# Patient Record
Sex: Female | Born: 1960 | Race: White | Hispanic: No | Marital: Married
Health system: Western US, Academic
[De-identification: ages and names within clinical notes are randomized; demographics above are authoritative.]

## PROBLEM LIST (undated history)

## (undated) MED FILL — DEXAMETHASONE 4 MG TABLET: 4 mg | ORAL | 28 days supply | Qty: 15 | Fill #0

## (undated) MED FILL — FILGRASTIM-AAFI 300 MCG/0.5 ML SUBCUTANEOUS SYRINGE: 300 mcg/0.5 mL | SUBCUTANEOUS | 12 days supply | Qty: 6 | Fill #0

## (undated) MED FILL — FILGRASTIM-AAFI 300 MCG/ML INJECTION SOLUTION: 300 mcg/mL | INTRAMUSCULAR | 6 days supply | Qty: 6 | Fill #0

## (undated) MED FILL — IXAZOMIB 3 MG CAPSULE: 3 mg | ORAL | 28 days supply | Qty: 3 | Fill #0

---

## 2011-05-11 MED ORDER — ALPRAZOLAM 1 MG TABLET
1 | ORAL | Status: DC | PRN
Start: 2011-05-11 — End: 2020-10-14

## 2011-05-28 MED ORDER — OXYCODONE 5 MG TABLET
5 | ORAL_TABLET | ORAL | Status: DC | PRN
Start: 2011-05-28 — End: 2020-10-14

## 2012-09-29 MED ORDER — CALCIUM CITRATE + D ORAL
ORAL | Status: DC
Start: 2012-09-29 — End: 2020-10-14

## 2013-08-17 MED ORDER — AZITHROMYCIN 500 MG TABLET
500 | ORAL_TABLET | ORAL | Status: DC
Start: 2013-08-17 — End: 2020-10-14

## 2013-08-24 MED ORDER — DIPHENHYDRAMINE 50 MG CAPSULE
50 | ORAL | Status: DC | PRN
Start: 2013-08-24 — End: 2020-10-14

## 2013-11-30 MED ORDER — MAGNESIUM 250 MG TABLET
250 | ORAL | Status: DC
Start: 2013-11-30 — End: 2020-10-14

## 2014-02-04 MED ORDER — CHOLECALCIFEROL (VITAMIN D3) 25 MCG (1,000 UNIT) CAPSULE
1000 | ORAL | Status: DC
Start: 2014-02-04 — End: 2020-10-14

## 2015-02-21 MED ORDER — REVLIMID 15 MG CAPSULE
15 | ORAL | Status: DC
Start: 2015-02-21 — End: 2020-10-14

## 2015-02-21 MED ORDER — ASPIRIN 81 MG TABLET,DELAYED RELEASE
81 | ORAL | Status: DC
Start: 2015-02-21 — End: 2020-10-14

## 2016-12-14 LAB — COMPLETE BLOOD COUNT WITH DIFF
Abs Basophils: 0.05 10*9/L (ref 0.0–0.1)
Abs Eosinophils: 0.18 10*9/L (ref 0.0–0.4)
Abs Imm Granulocytes: 0.04 10*9/L (ref <0.1)
Abs Lymphocytes: 1.46 10*9/L (ref 1.0–3.4)
Abs Monocytes: 0.6 10*9/L (ref 0.2–0.8)
Abs Neutrophils: 1.32 10*9/L — ABNORMAL LOW (ref 1.8–6.8)
Hematocrit: 37.7 % (ref 36–46)
Hemoglobin: 12.7 g/dL (ref 12.0–15.5)
MCH: 30.8 pg (ref 26–34)
MCHC: 33.7 g/dL (ref 31–36)
MCV: 92 fL (ref 80–100)
Platelet Count: 177 10*9/L (ref 140–450)
RBC Count: 4.12 10*12/L (ref 4.0–5.2)
WBC Count: 3.7 10*9/L (ref 3.4–10)

## 2016-12-14 NOTE — Progress Notes (Signed)
Ariel Braun  ST:[id number]    Date of Service:   12/14/16      Ariel Braun is a 56 y.o. female with a history of stable myeloma, who is here for follow-up and discussion of therapy.    For review the patient history is as follows:     IgG kappa myeloma, diagnosed 09/07/10  Significant bone involvement, 8% plasma cells, normal cyto 46XX, FISH showed only 3 copies 1q21, Stage 2 ISS (3.88 beta 2 microglobulin),IgG 4750, M-spike 3.2, kappa 586.9 mg/L.  RVD 10/2010 through April 2011 (? 4 cycles). Achieved VGPR with M-spike 0.4, IgG 886, kappa 27.6.  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  8 months post transplant, her M-spike continues to fall (0.2 on 11/26/11), she's doing great.    She has intermittent pain right hip, with x-ray showing old lesion. Would recommend PET/CT to rule-out persistent disease. She is concerned about extra radiation, but is so anxious about her myeloma, that I think getting the scan will reassure her.    01/21/12: M-spike 0.3, kappa 16.5, IgG 810. PET/CT neg.    Swelling of feet and nausea (maybe HAE or zometa).     Then had HAE attacks x 2 with abdominal pain and vomiting.     Observation and no bisphos 2/2 her concerns.    08/11/12: Stable M-protein 0.2, IgG 729, kappa 12.3 in August and IgG 723.     09/29/12: MRI shoulder no myeloma. Kappa 17.     12/08/12: HAE attacks more frequent (weekly) but less severe. To receive C 1 esterase inhibitor (Berinert) prior to receiving pneumovax, TdAP, and hep B (12/11/12).   12/18/12. Slow rise in para-protein to M-spike 0.4  01/26/13: Did well with immunizations with Berinert pre-med. Hasn't used [employer] since.    03/14/13: PET/CT no uptake. Meanwhile, M-spike of 0.4, IgG 937, kappa 18.5.    03/30/13: M-spike 0.6, IgG 1190, kappa 26.7.    06/12/13: No HAE. M-spike 0.6, IgG 1270.     08/24/13: M-spike 0.6, IG 1330, kappa 32.3. Continue to observe.     11/30/13: M-spike 0.7. Kappa 32.9. Stable.     03/29/14: MRI of hip  no myeloma. M-spike 1.1.     06/11/14: PET normal.     08/05/14: Started Rev 15 mg 3/4. M-spike 1, IgG 1640, kappa 62.2    09/23/14: Seeing [name]. Less pain in feet since Rev.M-spike 0.8, IgG 1532, kappa 62    11/22/14: Nice response to Rev only at 15 mg 3/4. Cont same.     02/17/15: Kappa up a bit to 79.6. Proposed adding Ixazomib to Rev dex if she gets to kappa 100. Don't think Elo would be a good option and Dara not yet necessary. Another option would be a Carfilzomib combo but would prefer to hold off as long as her relapse is just biochemical.    06/10/15: Kappa 76.9 Stable on Rev. Although ANC however at about 0.8 to 1, so would watch carefully.    09/16/15: Kappa 69.9 (down) but ANC 0.8. Will reduce Revlimid to 15 mg qod to allow ANC to rise a bit and I hope this does not impact the Kappa.    12/09/15: Feels much better on Rev 15 mg qod, but M-spike up to 1.1 and IgG 1660. Will add dex 8 mg weekly and ativan for anxiety and sleep.    04/06/16: Now on dex 4 mg two days in a row, Rev  15 mg q o d.   06/04/16: Kappa 30.1. Cont. QOD Rev 29m. PET scan ordered and scheduled.    08/20/16: Feels fine. Myeloma labs stable with M-spike 1.1, IgG 1500, kappa 59. PET/CT neg from 7/28. Plan to continue Rev 15 mg qod, Dex 8 mg qwk.    12/14/16: Minimal left flank pain which is rare and intermittent. No hematuria. Myeloma labs pending today but stable in October. Continue Rev 15 mg qod and dex 8 mg q week.      Interval History:    Past medical, family and social histories, as well as current medications and allergies, were reviewed and updated in the medical record as appropriate.         Review of Systems - Positive findings in bold  General: reviewed and is non-contributory to the illness/disease  Ophthalmic: reviewed and is non-contributory to the illness/disease  ENT: reviewed and is non-contributory to the illness/disease  Hematological and Lymphatic: reviewed and is non-contributory to the illness/disease  Respiratory: no  cough, shortness of breath, or wheezing  Cardiovascular: no chest pain or dyspnea on exertion  Gastrointestinal: no abdominal pain, change in bowel habits, or black or bloody stools  Musculoskeletal: Minor left flank pain which is intermittent. Otherwise, reviewed and is non-contributory to the illness/disease  Neurological: no TIA or stroke symptoms.  No PN.  Dermatological: reviewed and is non-contributory to the illness/disease  The remainder of the systems were reviewed and are non-contributory to the illness/disease    Physical Exam  KPS 100  ECOG 0   Constitutional: Pleasant. NAD  HENT: Oropharynx is clear and moist. No oral lesions/ulcerations   Eyes: Conjunctivae and EOM are normal. Pupils are equal, round, and reactive to light.   Neck: Neck supple. No JVD present. No thyromegaly present.  Lymph nodes:  negative   Cardiovascular:Nl S1S2, Regular rhythm and no murmurs.    Pulmonary/Chest:Normal resp rate, Breath sounds normal.   Abdominal: Soft and round, No HSM, no mass. There is no tenderness. There is no rebound and no guarding.    Musculoskeletal: Normal range of motion.   Lymphadenopathy: no cervical, inguinal adenopathy.   Neurological: Alert and oriented to person, place, and time. Motor/sensory nonfocal   MS:    No spine tenderness  Extremities: no clubbing, cyanosis or edema  Skin: Skin is warm and dry. No rash.   Psychiatric:  Normal mood and affect.     Lab Results   Component Value Date    WBC Count 3.7 12/14/2016    Hemoglobin 12.7 12/14/2016    Hematocrit 37.7 12/14/2016    MCV 92 12/14/2016    Platelet Count 177 12/14/2016     Lab Results   Component Value Date    Albumin, Serum / Plasma 3.6 12/14/2016      Lab Results   Component Value Date    Creatinine 0.72 12/14/2016    Creatinine Whole Blood 0.8 06/05/2014      Lab Results   Component Value Date    Sodium, Serum / Plasma 140 12/14/2016    Potassium, Serum / Plasma 3.5 12/14/2016    Chloride, Serum / Plasma 109 (H) 12/14/2016    Carbon  Dioxide, Total 26 12/14/2016     No results found for: TPUR  Prot Electrophoresis   Date Value Ref Range Status   12/14/2016 PENDING Normal pattern. Incomplete   08/19/2016 Abnormal (A) Normal pattern. Final     Comment:     Protein spike in gamma region of 1.1  g/dL, consistent with a monoclonal gammopathy  Interpretation by Pathologist: Clelia Schaumann, M.D.     05/25/2016 Abnormal (A) Normal pattern. Final     Comment:     Protein spike in gamma region of 1.0 g/dL, consistent with a monoclonal gammopathy  Interpretation by Pathologist: Clelia Schaumann, M.D.       Results for orders placed or performed during the hospital encounter of 08/19/16   Kappa and Lambda Free light Chains, Serum (every 4 weeks)   Result Value Ref Range    Kappa Lt Chain, Free 59.0 (H) 3.3 - 19.4 mg/L    Lambda Lt Chain, Free 5.6 (L) 5.7 - 26.3 mg/L    Kappa/Lambda Ratio 10.54 (H) 0.26 - 1.65   Results for orders placed or performed during the hospital encounter of 05/25/16   Kappa and Lambda Free light Chains, Serum (every 4 weeks)   Result Value Ref Range    Kappa Lt Chain, Free 60.0 (H) 3.3 - 19.4 mg/L    Lambda Lt Chain, Free 6.4 5.7 - 26.3 mg/L    Kappa/Lambda Ratio 9.38 (H) 0.26 - 1.65   Results for orders placed or performed during the hospital encounter of 03/29/16   Kappa and Lambda Free light Chains, Serum (every 4 weeks)   Result Value Ref Range    Kappa Lt Chain, Free 55.6 (H) 3.3 - 19.4 mg/L    Lambda Lt Chain, Free 7.6 5.7 - 26.3 mg/L    Kappa/Lambda Ratio 7.32 (H) 0.26 - 1.65     Lab Results   Component Value Date    IgA, serum 44 (L) 01/29/2016    IgA, serum 45 (L) 12/02/2015    IgA, serum 51 (L) 09/03/2015    IgG, serum 1,500 08/19/2016    IgG, serum 1,470 05/25/2016    IgG, serum 1,310 03/29/2016    IgM, serum 33 (L) 01/29/2016    IgM, serum 35 (L) 12/02/2015    IgM, serum 37 (L) 09/03/2015            Other New Studies:           ASSESSMENT and PLAN:    Response to therapy (if on therapy) is: SD        Myeloma    12/14/16: Minimal left flank pain which is rare and intermittent. No hematuria. Myeloma labs pending today but stable in October. Continue Rev 15 mg qod and dex 8 mg q week.          This patient has illness that poses a threat to life or bodily function and is / has been or will be undergoing drug therapy requiring intensive monitoring for toxicity.   I spent 30 minutes face to face with the patient of which 25 minutes of that time were spent in consultation and discussion and counseling regarding the diagnosis, the treatment plan, medication risks, lifestyle modification and symptoms.

## 2016-12-14 NOTE — Assessment & Plan Note (Signed)
12/14/16: Minimal left flank pain which is rare and intermittent. No hematuria. Myeloma labs pending today but stable in October. Continue Rev 15 mg qod and dex 8 mg q week.

## 2016-12-15 LAB — COMPREHENSIVE METABOLIC PANEL
AST: 19 U/L (ref 17–42)
Alanine transaminase: 21 U/L (ref 11–50)
Albumin, Serum / Plasma: 3.6 g/dL (ref 3.5–4.8)
Alkaline Phosphatase: 81 U/L (ref 31–95)
Anion Gap: 5 (ref 4–14)
Bilirubin, Total: 0.9 mg/dL (ref 0.2–1.3)
Calcium, total, Serum / Plasma: 9.1 mg/dL (ref 8.8–10.3)
Carbon Dioxide, Total: 26 mmol/L (ref 22–32)
Chloride, Serum / Plasma: 109 mmol/L — ABNORMAL HIGH (ref 97–108)
Creatinine: 0.72 mg/dL (ref 0.44–1.00)
Glucose, non-fasting: 94 mg/dL (ref 70–199)
Potassium, Serum / Plasma: 3.5 mmol/L (ref 3.5–5.1)
Protein, Total, Serum / Plasma: 6.9 g/dL (ref 6.0–8.4)
Sodium, Serum / Plasma: 140 mmol/L (ref 135–145)
Urea Nitrogen, Serum / Plasma: 15 mg/dL (ref 6–22)
eGFR - high estimate: 108 mL/min (ref >60)
eGFR - low estimate: 94 mL/min (ref >60)

## 2016-12-15 LAB — LACTATE DEHYDROGENASE, BLOOD: Lactate Dehydrogenase, Serum /: 149 U/L (ref 102–199)

## 2016-12-15 LAB — IGG, SERUM: IgG, serum: 1260 mg/dL (ref 672–1760)

## 2016-12-16 LAB — PROTEIN ELECTROPHORESIS, SERUM
Albumin, SPEP: 3.7 g/dL — ABNORMAL LOW (ref 3.75–5.01)
Alpha 1 Globulin: 0.39 g/dL (ref 0.19–0.46)
Alpha 2 Globulin: 0.63 g/dL (ref 0.48–1.05)
Beta Globulin: 0.71 g/dL (ref 0.48–1.10)
Gamma Globulin: 1.07 g/dL (ref 0.62–1.51)
Paraprotein Concentration: 0.9 g/dL — AB
Prot Electrophoresis: ABNORMAL — AB
Total Protein: 6.5 g/dL (ref 6.0–8.1)

## 2016-12-16 LAB — KAPPA AND LAMBDA FREE LIGHT CH
Kappa Lt Chain, Free: 44.2 mg/L — ABNORMAL HIGH (ref 3.3–19.4)
Kappa/Lambda Ratio: 8.5 — ABNORMAL HIGH (ref 0.26–1.65)
Lambda Lt Chain, Free: 5.2 mg/L — ABNORMAL LOW (ref 5.7–26.3)

## 2016-12-16 LAB — IMMUNOFIXATION ELECTROPHORESIS

## 2017-03-15 LAB — COMPREHENSIVE METABOLIC PANEL
AST: 20 U/L (ref 17–42)
Alanine transaminase: 26 U/L (ref 11–50)
Albumin, Serum / Plasma: 4 g/dL (ref 3.5–4.8)
Alkaline Phosphatase: 77 U/L (ref 31–95)
Anion Gap: 8 (ref 4–14)
Bilirubin, Total: 2.1 mg/dL — ABNORMAL HIGH (ref 0.2–1.3)
Calcium, total, Serum / Plasma: 9.4 mg/dL (ref 8.8–10.3)
Carbon Dioxide, Total: 25 mmol/L (ref 22–32)
Chloride, Serum / Plasma: 107 mmol/L (ref 97–108)
Creatinine: 0.8 mg/dL (ref 0.44–1.00)
Glucose, non-fasting: 94 mg/dL (ref 70–199)
Potassium, Serum / Plasma: 3.2 mmol/L — ABNORMAL LOW (ref 3.5–5.1)
Protein, Total, Serum / Plasma: 7.2 g/dL (ref 6.0–8.4)
Sodium, Serum / Plasma: 140 mmol/L (ref 135–145)
Urea Nitrogen, Serum / Plasma: 16 mg/dL (ref 6–22)
eGFR - high estimate: 95 mL/min (ref >60)
eGFR - low estimate: 82 mL/min (ref >60)

## 2017-03-15 LAB — COMPLETE BLOOD COUNT WITH DIFF
Abs Basophils: 0.02 10*9/L (ref 0.0–0.1)
Abs Eosinophils: 0.1 10*9/L (ref 0.0–0.4)
Abs Imm Granulocytes: 0.05 10*9/L (ref <0.1)
Abs Lymphocytes: 1.95 10*9/L (ref 1.0–3.4)
Abs Monocytes: 0.76 10*9/L (ref 0.2–0.8)
Abs Neutrophils: 2.16 10*9/L (ref 1.8–6.8)
Hematocrit: 42 % (ref 36–46)
Hemoglobin: 13.8 g/dL (ref 12.0–15.5)
MCH: 30.4 pg (ref 26–34)
MCHC: 32.9 g/dL (ref 31–36)
MCV: 93 fL (ref 80–100)
Platelet Count: 170 10*9/L (ref 140–450)
RBC Count: 4.54 10*12/L (ref 4.0–5.2)
WBC Count: 5 10*9/L (ref 3.4–10)

## 2017-03-15 LAB — LACTATE DEHYDROGENASE, BLOOD: Lactate Dehydrogenase, Serum /: 116 U/L (ref 102–199)

## 2017-03-15 NOTE — Progress Notes (Signed)
Ariel Braun  IR:[id number]    Date of Service:   03/15/17      Ariel Braun is a 56 y.o. female with a history of myeloma, stable for almost 3 years on Rev/dex, who is here for follow-up and recommendations.    For review the patient history is as follows:     IgG kappa myeloma, diagnosed 09/07/10  Significant bone involvement, 8% plasma cells, normal cyto 46XX, FISH showed only 3 copies 1q21, Stage 2 ISS (3.88 beta 2 microglobulin),IgG 4750, M-spike 3.2, kappa 586.9 mg/L.  RVD 10/2010 through April 2011 (? 4 cycles). Achieved VGPR with M-spike 0.4, IgG 886, kappa 27.6.  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  8 months post transplant, her M-spike continues to fall (0.2 on 11/26/11), she's doing great.    She has intermittent pain right hip, with x-ray showing old lesion. Would recommend PET/CT to rule-out persistent disease. She is concerned about extra radiation, but is so anxious about her myeloma, that I think getting the scan will reassure her.    01/21/12: M-spike 0.3, kappa 16.5, IgG 810. PET/CT neg.    Swelling of feet and nausea (maybe HAE or zometa).     Then had HAE attacks x 2 with abdominal pain and vomiting.     Observation and no bisphos 2/2 her concerns.    08/11/12: Stable M-protein 0.2, IgG 729, kappa 12.3 in August and IgG 723.     09/29/12: MRI shoulder no myeloma. Kappa 17.     12/08/12: HAE attacks more frequent (weekly) but less severe. To receive C 1 esterase inhibitor (Berinert) prior to receiving pneumovax, TdAP, and hep B (12/11/12).   12/18/12. Slow rise in para-protein to M-spike 0.4  01/26/13: Did well with immunizations with Ber[employer]ert pre-med. Hasn't used [employer] since.    03/14/13: PET/CT no uptake. Meanwhile, M-spike of 0.4, IgG 937, kappa 18.5.    03/30/13: M-spike 0.6, IgG 1190, kappa 26.7.    06/12/13: No HAE. M-spike 0.6, IgG 1270.     08/24/13: M-spike 0.6, IG 1330, kappa 32.3. Continue to observe.     11/30/13: M-spike 0.7. Kappa 32.9. Stable.      03/29/14: MRI of hip no myeloma. M-spike 1.1.     06/11/14: PET normal.     08/05/14: Started Rev 15 mg 3/4. M-spike 1, IgG 1640, kappa 62.2    09/23/14: Seeing [name]. Less pain in feet since Rev.M-spike 0.8, IgG 1532, kappa 62    11/22/14: Nice response to Rev only at 15 mg 3/4. Cont same.     02/17/15: Kappa up a bit to 79.6. Proposed adding Ixazomib to Rev dex if she gets to kappa 100. Don't think Elo would be a good option and Dara not yet necessary. Another option would be a Carfilzomib combo but would prefer to hold off as long as her relapse is just biochemical.    06/10/15: Kappa 76.9 Stable on Rev. Although ANC however at about 0.8 to 1, so would watch carefully.    09/16/15: Kappa 69.9 (down) but ANC 0.8. Will reduce Revlimid to 15 mg qod to allow ANC to rise a bit and I hope this does not impact the Kappa.    12/09/15: Feels much better on Rev 15 mg qod, but M-spike up to 1.1 and IgG 1660. Will add dex 8 mg weekly and ativan for anxiety and sleep.    04/06/16: Now on dex 4 mg two days  in a row, Rev 15 mg q o d.   06/04/16: Kappa 30.1. Cont. QOD Rev 68m. PET scan ordered and scheduled.    08/20/16: Feels fine. Myeloma labs stable with M-spike 1.1, IgG 1500, kappa 59. PET/CT neg from 7/28. Plan to continue Rev 15 mg qod, Dex 8 mg qwk.    12/14/16: Minimal left flank pain which is rare and intermittent. No hematuria. Myeloma labs better if anything with M-spike 0.9 and Kappa 44.2. Continue Rev 15 mg qod and dex 8 mg q week.    03/15/17:  Feels well.  Pain gone.  IgG 1530.  Continue Rev 15 mg qod and dex 8 mg weekly.      Interval History:    Past medical, family and social histories, as well as current medications and allergies, were reviewed and updated in the medical record as appropriate.         Review of Systems - Positive findings in bold  General: reviewed and is non-contributory to the illness/disease  Ophthalmic: reviewed and is non-contributory to the illness/disease  ENT: reviewed and is  non-contributory to the illness/disease  Hematological and Lymphatic: reviewed and is non-contributory to the illness/disease  Respiratory: no cough, shortness of breath, or wheezing  Cardiovascular: no chest pain or dyspnea on exertion  Gastrointestinal: no abdominal pain, change in bowel habits, or black or bloody stools  Musculoskeletal:   no bone pain. Otherwise, reviewed and is non-contributory to the illness/disease  Neurological: no TIA or stroke symptoms.  No PN.  Dermatological: reviewed and is non-contributory to the illness/disease  The remainder of the systems were reviewed and are non-contributory to the illness/disease    Physical Exam  KPS 100  ECOG 0   Constitutional: Pleasant. NAD  HENT: Oropharynx is clear and moist. No oral lesions/ulcerations   Eyes: Conjunctivae and EOM are normal. Pupils are equal, round, and reactive to light.   Neck: Neck supple. No JVD present. No thyromegaly present.  Lymph nodes:  negative   Cardiovascular:Nl S1S2, Regular rhythm and no murmurs.    Pulmonary/Chest:Normal resp rate, Breath sounds normal.   Abdominal: Soft and round, No HSM, no mass. There is no tenderness. There is no rebound and no guarding.    Musculoskeletal: Normal range of motion.   Lymphadenopathy: no cervical, inguinal adenopathy.   Neurological: Alert and oriented to person, place, and time. Motor/sensory nonfocal   MS:    No spine tenderness  Extremities: no clubbing, cyanosis or edema  Skin: Skin is warm and dry. No rash.   Psychiatric:  Normal mood and affect.     Lab Results   Component Value Date    WBC Count 5.0 03/15/2017    Hemoglobin 13.8 03/15/2017    Hematocrit 42.0 03/15/2017    MCV 93 03/15/2017    Platelet Count 170 03/15/2017     Lab Results   Component Value Date    Albumin, Serum / Plasma 4.0 03/15/2017      Lab Results   Component Value Date    Creatinine 0.80 03/15/2017    Creatinine Whole Blood 0.8 06/05/2014      Lab Results   Component Value Date    Sodium, Serum / Plasma 140  03/15/2017    Potassium, Serum / Plasma 3.2 (L) 03/15/2017    Chloride, Serum / Plasma 107 03/15/2017    Carbon Dioxide, Total 25 03/15/2017     No results found for: TPUR  Prot Electrophoresis   Date Value Ref Range Status  03/15/2017 PENDING Normal pattern. Incomplete   12/14/2016 Abnormal (A) Normal pattern. Final     Comment:     Hypoalbuminemia with a protein spike in gamma region of 0.9 g/dL, consistent with a monoclonal gammopathy  Interpretation by Pathologist: Clelia Schaumann, M.D.     08/19/2016 Abnormal (A) Normal pattern. Final     Comment:     Protein spike in gamma region of 1.1 g/dL, consistent with a monoclonal gammopathy  Interpretation by Pathologist: Clelia Schaumann, M.D.       Results for orders placed or performed in visit on 12/14/16   Kappa and Lambda Free light Chains, Serum   Result Value Ref Range    Kappa Lt Chain, Free 44.2 (H) 3.3 - 19.4 mg/L    Lambda Lt Chain, Free 5.2 (L) 5.7 - 26.3 mg/L    Kappa/Lambda Ratio 8.50 (H) 0.26 - 1.65   Results for orders placed or performed during the hospital encounter of 08/19/16   Kappa and Lambda Free light Chains, Serum (every 4 weeks)   Result Value Ref Range    Kappa Lt Chain, Free 59.0 (H) 3.3 - 19.4 mg/L    Lambda Lt Chain, Free 5.6 (L) 5.7 - 26.3 mg/L    Kappa/Lambda Ratio 10.54 (H) 0.26 - 1.65   Results for orders placed or performed during the hospital encounter of 05/25/16   Kappa and Lambda Free light Chains, Serum (every 4 weeks)   Result Value Ref Range    Kappa Lt Chain, Free 60.0 (H) 3.3 - 19.4 mg/L    Lambda Lt Chain, Free 6.4 5.7 - 26.3 mg/L    Kappa/Lambda Ratio 9.38 (H) 0.26 - 1.65     Lab Results   Component Value Date    IgA, serum 44 (L) 01/29/2016    IgA, serum 45 (L) 12/02/2015    IgA, serum 51 (L) 09/03/2015    IgG, serum 1,530 03/15/2017    IgG, serum 1,260 12/14/2016    IgG, serum 1,500 08/19/2016    IgM, serum 33 (L) 01/29/2016    IgM, serum 35 (L) 12/02/2015    IgM, serum 37 (L) 09/03/2015            Other New Studies:            ASSESSMENT and PLAN:    Response to therapy (if on therapy) is: SD       Myeloma    03/15/17:  Feels well.  Pain gone.  IgG 1530.  Continue Rev 15 mg qod and dex 8 mg weekly.          This patient has illness that poses a threat to life or bodily function and is / has been or will be undergoing drug therapy requiring intensive monitoring for toxicity.   I spent 30 minutes face to face with the patient of which 25 minutes of that time were spent in consultation and discussion and counseling regarding the diagnosis, the treatment plan, medication risks, lifestyle modification and symptoms.

## 2017-03-16 LAB — IGG, SERUM: IgG, serum: 1530 mg/dL (ref 672–1760)

## 2017-03-16 NOTE — Assessment & Plan Note (Signed)
03/15/17:  Feels well.  Pain gone.  IgG 1530.  Continue Rev 15 mg qod and dex 8 mg weekly.

## 2017-03-17 LAB — PROTEIN ELECTROPHORESIS, SERUM
Albumin, SPEP: 3.88 g/dL (ref 3.75–5.01)
Alpha 1 Globulin: 0.34 g/dL (ref 0.19–0.46)
Alpha 2 Globulin: 0.62 g/dL (ref 0.48–1.05)
Beta Globulin: 0.73 g/dL (ref 0.48–1.10)
Gamma Globulin: 1.23 g/dL (ref 0.62–1.51)
Paraprotein Concentration: 1 g/dL — AB
Prot Electrophoresis: ABNORMAL — AB
Total Protein: 6.8 g/dL (ref 6.0–8.1)

## 2017-03-17 LAB — IMMUNOFIXATION ELECTROPHORESIS

## 2017-03-17 LAB — KAPPA AND LAMBDA FREE LIGHT CH
Kappa Lt Chain, Free: 46.4 mg/L — ABNORMAL HIGH (ref 3.3–19.4)
Kappa/Lambda Ratio: 8.75 — ABNORMAL HIGH (ref 0.26–1.65)
Lambda Lt Chain, Free: 5.3 mg/L — ABNORMAL LOW (ref 5.7–26.3)

## 2017-06-16 LAB — COMPREHENSIVE METABOLIC PANEL
AST: 20 U/L (ref 17–42)
Alanine transaminase: 26 U/L (ref 11–50)
Albumin, Serum / Plasma: 3.8 g/dL (ref 3.5–4.8)
Alkaline Phosphatase: 81 U/L (ref 31–95)
Anion Gap: 7 (ref 4–14)
Bilirubin, Total: 1.6 mg/dL — ABNORMAL HIGH (ref 0.2–1.3)
Calcium, total, Serum / Plasma: 9.3 mg/dL (ref 8.8–10.3)
Carbon Dioxide, Total: 26 mmol/L (ref 22–32)
Chloride, Serum / Plasma: 105 mmol/L (ref 97–108)
Creatinine: 0.81 mg/dL (ref 0.44–1.00)
Glucose, non-fasting: 94 mg/dL (ref 70–199)
Potassium, Serum / Plasma: 3.4 mmol/L — ABNORMAL LOW (ref 3.5–5.1)
Protein, Total, Serum / Plasma: 7.1 g/dL (ref 6.0–8.4)
Sodium, Serum / Plasma: 138 mmol/L (ref 135–145)
Urea Nitrogen, Serum / Plasma: 13 mg/dL (ref 6–22)
eGFR - high estimate: 93 mL/min (ref >60)
eGFR - low estimate: 81 mL/min (ref >60)

## 2017-06-16 LAB — COMPLETE BLOOD COUNT WITH DIFF
Abs Basophils: 0.05 10*9/L (ref 0.0–0.1)
Abs Eosinophils: 0.16 10*9/L (ref 0.0–0.4)
Abs Imm Granulocytes: 0.04 10*9/L (ref <0.1)
Abs Lymphocytes: 1.7 10*9/L (ref 1.0–3.4)
Abs Monocytes: 0.45 10*9/L (ref 0.2–0.8)
Abs Neutrophils: 1.71 10*9/L — ABNORMAL LOW (ref 1.8–6.8)
Hematocrit: 41.4 % (ref 36–46)
Hemoglobin: 13.8 g/dL (ref 12.0–15.5)
MCH: 30.7 pg (ref 26–34)
MCHC: 33.3 g/dL (ref 31–36)
MCV: 92 fL (ref 80–100)
Platelet Count: 191 10*9/L (ref 140–450)
RBC Count: 4.49 10*12/L (ref 4.0–5.2)
WBC Count: 4.1 10*9/L (ref 3.4–10)

## 2017-06-16 LAB — IGG, SERUM: IgG, serum: 1480 mg/dL (ref 672–1760)

## 2017-06-17 LAB — PROTEIN ELECTROPHORESIS, SERUM
Albumin, SPEP: 3.5 g/dL (ref 3.4–4.7)
Alpha 1 Globulin: 0.3 g/dL (ref 0.1–0.3)
Alpha 2 Globulin: 0.7 g/dL (ref 0.6–1.0)
Beta Globulin: 1.1 g/dL (ref 0.7–1.4)
Gamma Globulin: 1.3 g/dL (ref 0.6–1.6)
Paraprotein Concentration: 1.2 g/dL — AB
Prot Electrophoresis: ABNORMAL — AB
Total Protein: 7 g/dL (ref 6.0–8.1)

## 2017-06-17 LAB — KAPPA AND LAMBDA FREE LIGHT CH
Kappa Lt Chain, Free: 58.9 mg/L — ABNORMAL HIGH (ref 3.3–19.4)
Kappa/Lambda Ratio: 6.85 — ABNORMAL HIGH (ref 0.26–1.65)
Lambda Lt Chain, Free: 8.6 mg/L (ref 5.7–26.3)

## 2017-06-21 NOTE — Progress Notes (Signed)
Ariel Braun  BJ:[id number]    Date of Service:   06/21/17      Ariel Braun is a 56 y.o. female with a history of myeloma, now stable on Rev/dex, who is here for follow-up and recommendations for management.    For review the patient history is as follows:     IgG kappa myeloma, diagnosed 09/07/10  Significant bone involvement, 8% plasma cells, normal cyto 46XX, FISH showed only 3 copies 1q21, Stage 2 ISS (3.88 beta 2 microglobulin),IgG 4750, M-spike 3.2, kappa 586.9 mg/L.  RVD 10/2010 through April 2011 (? 4 cycles). Achieved VGPR with M-spike 0.4, IgG 886, kappa 27.6.  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  8 months post transplant, her M-spike continues to fall (0.2 on 11/26/11), she's doing great.    She has intermittent pain right hip, with x-ray showing old lesion. Would recommend PET/CT to rule-out persistent disease. She is concerned about extra radiation, but is so anxious about her myeloma, that I think getting the scan will reassure her.    01/21/12: M-spike 0.3, kappa 16.5, IgG 810. PET/CT neg.    Swelling of feet and nausea (maybe HAE or zometa).     Then had HAE attacks x 2 with abdominal pain and vomiting.     Observation and no bisphos 2/2 her concerns.    08/11/12: Stable M-protein 0.2, IgG 729, kappa 12.3 in August and IgG 723.     09/29/12: MRI shoulder no myeloma. Kappa 17.     12/08/12: HAE attacks more frequent (weekly) but less severe. To receive C 1 esterase inhibitor (Berinert) prior to receiving pneumovax, TdAP, and hep B (12/11/12).   12/18/12. Slow rise in para-protein to M-spike 0.4  01/26/13: Did well with immunizations with Berinert pre-med. Hasn't used [employer] since.    03/14/13: PET/CT no uptake. Meanwhile, M-spike of 0.4, IgG 937, kappa 18.5.    03/30/13: M-spike 0.6, IgG 1190, kappa 26.7.    06/12/13: No HAE. M-spike 0.6, IgG 1270.     08/24/13: M-spike 0.6, IG 1330, kappa 32.3. Continue to observe.     11/30/13: M-spike 0.7. Kappa 32.9. Stable.      03/29/14: MRI of hip no myeloma. M-spike 1.1.     06/11/14: PET normal.     08/05/14: Started Rev 15 mg 3/4. M-spike 1, IgG 1640, kappa 62.2    09/23/14: Seeing [name]. Less pain in feet since Rev.M-spike 0.8, IgG 1532, kappa 62    11/22/14: Nice response to Rev only at 15 mg 3/4. Cont same.     02/17/15: Kappa up a bit to 79.6. Proposed adding Ixazomib to Rev dex if she gets to kappa 100. Don't think Elo would be a good option and Dara not yet necessary. Another option would be a Carfilzomib combo but would prefer to hold off as long as her relapse is just biochemical.    06/10/15: Kappa 76.9 Stable on Rev. Although ANC however at about 0.8 to 1, so would watch carefully.    09/16/15: Kappa 69.9 (down) but ANC 0.8. Will reduce Revlimid to 15 mg qod to allow ANC to rise a bit and I hope this does not impact the Kappa.    12/09/15: Feels much better on Rev 15 mg qod, but M-spike up to 1.1 and IgG 1660. Will add dex 8 mg weekly and ativan for anxiety and sleep.    04/06/16: Now on dex 4 mg two days in  a row, Rev 15 mg q o d.   06/04/16: Kappa 30.1. Cont. QOD Rev 18m. PET scan ordered and scheduled.    08/20/16: Feels fine. Myeloma labs stable with M-spike 1.1, IgG 1500, kappa 59. PET/CT neg from 7/28. Plan to continue Rev 15 mg qod, Dex 8 mg qwk.    12/14/16: Minimal left flank pain which is rare and intermittent. No hematuria. Myeloma labs better if anything with M-spike 0.9 and Kappa 44.2. Continue Rev 15 mg qod and dex 8 mg q week.    03/15/17: Feels well. Pain gone. IgG 1530. Continue Rev 15 mg qod and dex 8 mg weekly.    06/21/17:  Stable M-spike 1.2, IgG 1480, kappa 58.9.  Continue Rev 15 mg qod and dex 8 mg q wk.      Interval History:    Past medical, family and social histories, as well as current medications and allergies, were reviewed and updated in the medical record as appropriate.         Review of Systems - Positive findings in bold  General: reviewed and is non-contributory to the illness/disease  Ophthalmic:  reviewed and is non-contributory to the illness/disease  ENT: reviewed and is non-contributory to the illness/disease  Hematological and Lymphatic: reviewed and is non-contributory to the illness/disease  Respiratory: no cough, shortness of breath, or wheezing  Cardiovascular: no chest pain or dyspnea on exertion  Gastrointestinal: no abdominal pain, change in bowel habits, or black or bloody stools  Musculoskeletal:   no bone pain. Otherwise, reviewed and is non-contributory to the illness/disease  Neurological: no TIA or stroke symptoms.  No PN.  Dermatological: reviewed and is non-contributory to the illness/disease  The remainder of the systems were reviewed and are non-contributory to the illness/disease    Physical Exam  KPS 100  ECOG 0   Constitutional: Pleasant. NAD  HENT: Oropharynx is clear and moist. No oral lesions/ulcerations   Eyes: Conjunctivae and EOM are normal. Pupils are equal, round, and reactive to light.   Neck: Neck supple. No JVD present. No thyromegaly present.  Lymph nodes:  negative   Cardiovascular:Nl S1S2, Regular rhythm and no murmurs.    Pulmonary/Chest:Normal resp rate, Breath sounds normal.   Abdominal: Soft and round, No HSM, no mass. There is no tenderness. There is no rebound and no guarding.    Musculoskeletal: Normal range of motion.   Lymphadenopathy: no cervical, inguinal adenopathy.   Neurological: Alert and oriented to person, place, and time. Motor/sensory nonfocal   MS:    No spine tenderness  Extremities: no clubbing, cyanosis or edema  Skin: Skin is warm and dry. No rash.   Psychiatric:  Normal mood and affect.     Lab Results   Component Value Date    WBC Count 4.1 06/16/2017    Hemoglobin 13.8 06/16/2017    Hematocrit 41.4 06/16/2017    MCV 92 06/16/2017    Platelet Count 191 06/16/2017     Lab Results   Component Value Date    Albumin, Serum / Plasma 3.8 06/16/2017      Lab Results   Component Value Date    Creatinine 0.81 06/16/2017    Creatinine Whole Blood 0.8  06/05/2014      Lab Results   Component Value Date    Sodium, Serum / Plasma 138 06/16/2017    Potassium, Serum / Plasma 3.4 (L) 06/16/2017    Chloride, Serum / Plasma 105 06/16/2017    Carbon Dioxide, Total 26 06/16/2017  No results found for: TPUR  Prot Electrophoresis   Date Value Ref Range Status   06/16/2017 Abnormal (A) Normal pattern. 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 Paraprotein spike =  1.2 g/dL.  Interpretation by Pathologist: Clelia Schaumann, M.D.     03/15/2017 Abnormal (A) Normal pattern. Final     Comment:     Protein spike in gamma region of 1.0 g/dL, consistent with a monoclonal gammopathy  Interpretation by Pathologist: Clelia Schaumann, M.D.     12/14/2016 Abnormal (A) Normal pattern. Final     Comment:     Hypoalbuminemia with a protein spike in gamma region of 0.9 g/dL, consistent with a monoclonal gammopathy  Interpretation by Pathologist: Clelia Schaumann, M.D.       Results for orders placed or performed during the hospital encounter of 06/16/17   Kappa and Lambda Free light Chains, Serum   Result Value Ref Range    Kappa Lt Chain, Free 58.9 (H) 3.3 - 19.4 mg/L    Lambda Lt Chain, Free 8.6 5.7 - 26.3 mg/L    Kappa/Lambda Ratio 6.85 (H) 0.26 - 1.65   Results for orders placed or performed in visit on 03/15/17   Kappa and Lambda Free light Chains, Serum   Result Value Ref Range    Kappa Lt Chain, Free 46.4 (H) 3.3 - 19.4 mg/L    Lambda Lt Chain, Free 5.3 (L) 5.7 - 26.3 mg/L    Kappa/Lambda Ratio 8.75 (H) 0.26 - 1.65   Results for orders placed or performed in visit on 12/14/16   Kappa and Lambda Free light Chains, Serum   Result Value Ref Range    Kappa Lt Chain, Free 44.2 (H) 3.3 - 19.4 mg/L    Lambda Lt Chain, Free 5.2 (L) 5.7 - 26.3 mg/L    Kappa/Lambda Ratio 8.50 (H) 0.26 - 1.65     Lab Results   Component Value Date    IgA, serum 44 (L) 01/29/2016    IgA, serum 45 (L) 12/02/2015    IgA, serum 51 (L) 09/03/2015    IgG,  serum 1,480 06/16/2017    IgG, serum 1,530 03/15/2017    IgG, serum 1,260 12/14/2016    IgM, serum 33 (L) 01/29/2016    IgM, serum 35 (L) 12/02/2015    IgM, serum 37 (L) 09/03/2015            Other New Studies:           ASSESSMENT and PLAN:    Response to therapy (if on therapy) is: SD       Myeloma    06/21/17:  Stable M-spike 1.2, IgG 1480, kappa 58.9.  Continue Rev 15 mg qod and dex 8 mg q wk    Will refer to Social Work due to increasing stress with son (who is mentally challenged and has renal calcium calculi) and now mother with new cancer.      This patient has illness that poses a threat to life or bodily function and is / has been or will be undergoing drug therapy requiring intensive monitoring for toxicity.   I spent 30 minutes face to face with the patient of which 25 minutes of that time were spent in consultation and discussion and counseling regarding the diagnosis, the treatment plan, medication risks, lifestyle modification and symptoms.

## 2017-06-21 NOTE — Assessment & Plan Note (Signed)
06/21/17:  Stable M-spike 1.2, IgG 1480, kappa 58.9.  Continue Rev 15 mg qod and dex 8 mg q wk

## 2017-06-22 NOTE — Telephone Encounter (Signed)
Social Work Progress /Follow-Up Note    Data: Ariel Braun is a 56 y.o. female being followed at the outpatient [facility] by Dr. Cyndi Bender with a history of myeloma, now stable on Rev/dex. SW consulted by RN to address concerns and provide support.     Assessment: SW reached out to pt via phone, message was left w/ contact information to reach out to SW at earliest convenience.     Plan: SW will remain available to patient for psycho-social, emotional support and resource linkage.     Orvis Brill, MSW   Phone: [phone]  Pager: [phone]

## 2017-08-25 MED ORDER — ACYCLOVIR 400 MG TABLET
400 | ORAL_TABLET | ORAL | 1 refills | Status: DC
Start: 2017-08-25 — End: 2019-03-20

## 2017-09-16 ENCOUNTER — Ambulatory Visit: Admit: 2017-09-16 | Discharge: 2017-09-16 | Payer: PRIVATE HEALTH INSURANCE

## 2017-09-16 DIAGNOSIS — C9000 Multiple myeloma not having achieved remission: Secondary | ICD-10-CM

## 2017-09-16 DIAGNOSIS — D848 Other specified immunodeficiencies: Secondary | ICD-10-CM

## 2017-09-16 DIAGNOSIS — Z9484 Stem cells transplant status: Secondary | ICD-10-CM

## 2017-09-16 LAB — COMPLETE BLOOD COUNT WITH DIFF
Abs Basophils: 0.06 10*9/L (ref 0.0–0.1)
Abs Eosinophils: 0.14 10*9/L (ref 0.0–0.4)
Abs Imm Granulocytes: 0.05 10*9/L (ref <0.1)
Abs Lymphocytes: 1.45 10*9/L (ref 1.0–3.4)
Abs Monocytes: 0.44 10*9/L (ref 0.2–0.8)
Abs Neutrophils: 2.14 10*9/L (ref 1.8–6.8)
Hematocrit: 42 % (ref 36–46)
Hemoglobin: 14.3 g/dL (ref 12.0–15.5)
MCH: 31.4 pg (ref 26–34)
MCHC: 34 g/dL (ref 31–36)
MCV: 92 fL (ref 80–100)
Platelet Count: 178 10*9/L (ref 140–450)
RBC Count: 4.56 10*12/L (ref 4.0–5.2)
WBC Count: 4.3 10*9/L (ref 3.4–10)

## 2017-09-16 LAB — COMPREHENSIVE METABOLIC PANEL
AST: 26 U/L (ref 17–42)
Alanine transaminase: 39 U/L (ref 11–50)
Albumin, Serum / Plasma: 3.8 g/dL (ref 3.5–4.8)
Alkaline Phosphatase: 84 U/L (ref 31–95)
Anion Gap: 10 (ref 4–14)
Bilirubin, Total: 1.6 mg/dL — ABNORMAL HIGH (ref 0.2–1.3)
Calcium, total, Serum / Plasma: 9.5 mg/dL (ref 8.8–10.3)
Carbon Dioxide, Total: 24 mmol/L (ref 22–32)
Chloride, Serum / Plasma: 105 mmol/L (ref 97–108)
Creatinine: 0.76 mg/dL (ref 0.44–1.00)
Glucose, non-fasting: 97 mg/dL (ref 70–199)
Potassium, Serum / Plasma: 3.3 mmol/L — ABNORMAL LOW (ref 3.5–5.1)
Protein, Total, Serum / Plasma: 6.9 g/dL (ref 6.0–8.4)
Sodium, Serum / Plasma: 139 mmol/L (ref 135–145)
Urea Nitrogen, Serum / Plasma: 12 mg/dL (ref 6–22)
eGFR - high estimate: 101 mL/min (ref >60)
eGFR - low estimate: 87 mL/min (ref >60)

## 2017-09-16 LAB — IGG, SERUM: IgG, serum: 1440 mg/dL (ref 672–1760)

## 2017-09-16 MED ORDER — OSELTAMIVIR 75 MG CAPSULE
75 | ORAL_CAPSULE | Freq: Two times a day (BID) | ORAL | 0 refills | Status: AC
Start: 2017-09-16 — End: 2017-09-26

## 2017-09-16 NOTE — Assessment & Plan Note (Signed)
09/16/17: Stable. M-spike 0.2, kappa 59. Continue Rev 15 mg qod and dex 8 mg q wk. Note: no HAE episodes in 2 years.

## 2017-09-16 NOTE — Progress Notes (Signed)
NALLELI LARGENT  TR:[id number]    Date of Service:   09/16/17      Ariel Braun is a 56 y.o. female with a history of myeloma, currently quite stable on Revlimid 15 mg qod and dex 8 mg weekly, who is here for follow-up and recommendations for management.    For review the patient history is as follows:     IgG kappa myeloma, diagnosed 09/07/10  Significant bone involvement, 8% plasma cells, normal cyto 46XX, FISH showed only 3 copies 1q21, Stage 2 ISS (3.88 beta 2 microglobulin),IgG 4750, M-spike 3.2, kappa 586.9 mg/L.  RVD 10/2010 through April 2011 (? 4 cycles). Achieved VGPR with M-spike 0.4, IgG 886, kappa 27.6.  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  8 months post transplant, her M-spike continues to fall (0.2 on 11/26/11), she's doing great.    She has intermittent pain right hip, with x-ray showing old lesion. Would recommend PET/CT to rule-out persistent disease. She is concerned about extra radiation, but is so anxious about her myeloma, that I think getting the scan will reassure her.    01/21/12: M-spike 0.3, kappa 16.5, IgG 810. PET/CT neg.    Swelling of feet and nausea (maybe HAE or zometa).     Then had HAE attacks x 2 with abdominal pain and vomiting.     Observation and no bisphos 2/2 her concerns.    08/11/12: Stable M-protein 0.2, IgG 729, kappa 12.3 in August and IgG 723.     09/29/12: MRI shoulder no myeloma. Kappa 17.     12/08/12: HAE attacks more frequent (weekly) but less severe. To receive C 1 esterase inhibitor (Berinert) prior to receiving pneumovax, TdAP, and hep B (12/11/12).   12/18/12. Slow rise in para-protein to M-spike 0.4  01/26/13: Did well with immunizations with Berinert pre-med. Hasn't used [employer] since.    03/14/13: PET/CT no uptake. Meanwhile, M-spike of 0.4, IgG 937, kappa 18.5.    03/30/13: M-spike 0.6, IgG 1190, kappa 26.7.    06/12/13: No HAE. M-spike 0.6, IgG 1270.     08/24/13: M-spike 0.6, IG 1330, kappa 32.3. Continue to observe.      11/30/13: M-spike 0.7. Kappa 32.9. Stable.     03/29/14: MRI of hip no myeloma. M-spike 1.1.     06/11/14: PET normal.     08/05/14: Started Rev 15 mg 3/4. M-spike 1, IgG 1640, kappa 62.2    09/23/14: Seeing [name]. Less pain in feet since Rev.M-spike 0.8, IgG 1532, kappa 62    11/22/14: Nice response to Rev only at 15 mg 3/4. Cont same.     02/17/15: Kappa up a bit to 79.6. Proposed adding Ixazomib to Rev dex if she gets to kappa 100. Don't think Elo would be a good option and Dara not yet necessary. Another option would be a Carfilzomib combo but would prefer to hold off as long as her relapse is just biochemical.    06/10/15: Kappa 76.9 Stable on Rev. Although ANC however at about 0.8 to 1, so would watch carefully.    09/16/15: Kappa 69.9 (down) but ANC 0.8. Will reduce Revlimid to 15 mg qod to allow ANC to rise a bit and I hope this does not impact the Kappa.    12/09/15: Feels much better on Rev 15 mg qod, but M-spike up to 1.1 and IgG 1660. Will add dex 8 mg weekly and ativan for anxiety and sleep.  04/06/16: Now on dex 4 mg two days in a row, Rev 15 mg q o d.   06/04/16: Kappa 30.1. Cont. QOD Rev 54m. PET scan ordered and scheduled.    08/20/16: Feels fine. Myeloma labs stable with M-spike 1.1, IgG 1500, kappa 59. PET/CT neg from 7/28. Plan to continue Rev 15 mg qod, Dex 8 mg qwk.    12/14/16: Minimal left flank pain which is rare and intermittent. No hematuria. Myeloma labs better if anything with M-spike 0.9 and Kappa 44.2. Continue Rev 15 mg qod and dex 8 mg q week.    03/15/17: Feels well. Pain gone. IgG 1530. Continue Rev 15 mg qod and dex 8 mg weekly.    06/21/17: Stable M-spike 1.2, IgG 1480, kappa 58.9. Continue Rev 15 mg qod and dex 8 mg q wk.     09/16/17: Stable. M-spike 0.2, kappa 59. Continue Rev 15 mg qod and dex 8 mg q wk. Note: no HAE episodes in 2 years.      Interval History:    Past medical, family and social histories, as well as current medications and allergies, were reviewed and updated in the  medical record as appropriate.         Review of Systems - Positive findings in bold  General: reviewed and is non-contributory to the illness/disease  Ophthalmic: reviewed and is non-contributory to the illness/disease  ENT: reviewed and is non-contributory to the illness/disease  Hematological and Lymphatic: reviewed and is non-contributory to the illness/disease  Respiratory: no cough, shortness of breath, or wheezing  Cardiovascular: no chest pain or dyspnea on exertion  Gastrointestinal: no abdominal pain, change in bowel habits, or black or bloody stools  Musculoskeletal:   no bone pain. Otherwise, reviewed and is non-contributory to the illness/disease  Neurological: no TIA or stroke symptoms.  No PN.  Dermatological: reviewed and is non-contributory to the illness/disease  The remainder of the systems were reviewed and are non-contributory to the illness/disease    Physical Exam  KPS 100  ECOG 0   Constitutional: Pleasant. NAD  HENT: Oropharynx is clear and moist. No oral lesions/ulcerations   Eyes: Conjunctivae and EOM are normal. Pupils are equal, round, and reactive to light.   Neck: Neck supple. No JVD present. No thyromegaly present.  Lymph nodes:  negative   Cardiovascular:Nl S1S2, Regular rhythm and no murmurs.    Pulmonary/Chest:Normal resp rate, Breath sounds normal.   Abdominal: Soft and round, No HSM, no mass. There is no tenderness. There is no rebound and no guarding.    Musculoskeletal: Normal range of motion.   Lymphadenopathy: no cervical, inguinal adenopathy.   Neurological: Alert and oriented to person, place, and time. Motor/sensory nonfocal   MS:    No spine tenderness  Extremities: no clubbing, cyanosis or edema  Skin: Skin is warm and dry. No rash.   Psychiatric:  Normal mood and affect.     Lab Results   Component Value Date    WBC Count 4.3 09/16/2017    Hemoglobin 14.3 09/16/2017    Hematocrit 42.0 09/16/2017    MCV 92 09/16/2017    Platelet Count 178 09/16/2017     Lab Results    Component Value Date    Albumin, Serum / Plasma 3.8 09/16/2017      Lab Results   Component Value Date    Creatinine 0.76 09/16/2017    Creatinine Whole Blood 0.8 06/05/2014      Lab Results   Component Value Date  Sodium, Serum / Plasma 139 09/16/2017    Potassium, Serum / Plasma 3.3 (L) 09/16/2017    Chloride, Serum / Plasma 105 09/16/2017    Carbon Dioxide, Total 24 09/16/2017     No results found for: TPUR  Prot Electrophoresis   Date Value Ref Range Status   09/16/2017 PENDING Normal pattern. Incomplete   06/16/2017 Abnormal (A) Normal pattern. 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 Paraprotein spike =  1.2 g/dL.  Interpretation by Pathologist: Clelia Schaumann, M.D.     03/15/2017 Abnormal (A) Normal pattern. Final     Comment:     Protein spike in gamma region of 1.0 g/dL, consistent with a monoclonal gammopathy  Interpretation by Pathologist: Clelia Schaumann, M.D.       Results for orders placed or performed during the hospital encounter of 06/16/17   Kappa and Lambda Free light Chains, Serum   Result Value Ref Range    Kappa Lt Chain, Free 58.9 (H) 3.3 - 19.4 mg/L    Lambda Lt Chain, Free 8.6 5.7 - 26.3 mg/L    Kappa/Lambda Ratio 6.85 (H) 0.26 - 1.65   Results for orders placed or performed in visit on 03/15/17   Kappa and Lambda Free light Chains, Serum   Result Value Ref Range    Kappa Lt Chain, Free 46.4 (H) 3.3 - 19.4 mg/L    Lambda Lt Chain, Free 5.3 (L) 5.7 - 26.3 mg/L    Kappa/Lambda Ratio 8.75 (H) 0.26 - 1.65   Results for orders placed or performed in visit on 12/14/16   Kappa and Lambda Free light Chains, Serum   Result Value Ref Range    Kappa Lt Chain, Free 44.2 (H) 3.3 - 19.4 mg/L    Lambda Lt Chain, Free 5.2 (L) 5.7 - 26.3 mg/L    Kappa/Lambda Ratio 8.50 (H) 0.26 - 1.65     Lab Results   Component Value Date    IgA, serum 44 (L) 01/29/2016    IgA, serum 45 (L) 12/02/2015    IgA, serum 51 (L) 09/03/2015    IgG, serum 1,480  06/16/2017    IgG, serum 1,530 03/15/2017    IgG, serum 1,260 12/14/2016    IgM, serum 33 (L) 01/29/2016    IgM, serum 35 (L) 12/02/2015    IgM, serum 37 (L) 09/03/2015            Other New Studies:           ASSESSMENT and PLAN:    Response to therapy (if on therapy) is: SD       Myeloma    09/16/17: Stable. M-spike 0.2, kappa 59. Continue Rev 15 mg qod and dex 8 mg q wk. Note: no HAE episodes in 2 years.          This patient has illness that poses a threat to life or bodily function and is / has been or will be undergoing drug therapy requiring intensive monitoring for toxicity.   I spent 30 minutes face to face with the patient of which 25 minutes of that time were spent in consultation and discussion and counseling regarding the diagnosis, the treatment plan, medication risks, lifestyle modification and symptoms.

## 2017-09-20 LAB — PROTEIN ELECTROPHORESIS, SERUM
Albumin, SPEP: 3.5 g/dL (ref 3.4–4.7)
Alpha 1 Globulin: 0.2 g/dL (ref 0.1–0.3)
Alpha 2 Globulin: 0.7 g/dL (ref 0.6–1.0)
Beta Globulin: 1.1 g/dL (ref 0.7–1.4)
Gamma Globulin: 1.4 g/dL (ref 0.6–1.6)
Paraprotein Concentration: 1.2 g/dL — AB
Prot Electrophoresis: ABNORMAL — AB
Total Protein: 6.8 g/dL (ref 6.0–8.1)

## 2017-09-20 LAB — KAPPA AND LAMBDA FREE LIGHT CH
Kappa Lt Chain, Free: 52.6 mg/L — ABNORMAL HIGH (ref 3.3–19.4)
Kappa/Lambda Ratio: 8.35 — ABNORMAL HIGH (ref 0.26–1.65)
Lambda Lt Chain, Free: 6.3 mg/L (ref 5.7–26.3)

## 2017-09-20 LAB — IMMUNOFIXATION ELECTROPHORESIS

## 2017-12-13 MED ORDER — DEXAMETHASONE 4 MG TABLET
4 | ORAL_TABLET | ORAL | 3 refills | Status: DC
Start: 2017-12-13 — End: 2017-12-23

## 2017-12-26 MED ORDER — DEXAMETHASONE 4 MG TABLET
4 | ORAL_TABLET | ORAL | 3 refills | Status: DC
Start: 2017-12-26 — End: 2018-04-11

## 2018-01-10 ENCOUNTER — Ambulatory Visit: Admit: 2018-01-10 | Discharge: 2018-01-10 | Payer: PRIVATE HEALTH INSURANCE

## 2018-01-10 DIAGNOSIS — D848 Other specified immunodeficiencies: Secondary | ICD-10-CM

## 2018-01-10 DIAGNOSIS — Z9484 Stem cells transplant status: Secondary | ICD-10-CM

## 2018-01-10 DIAGNOSIS — C9000 Multiple myeloma not having achieved remission: Secondary | ICD-10-CM

## 2018-01-10 LAB — COMPLETE BLOOD COUNT WITH DIFF
Abs Basophils: 0.06 10*9/L (ref 0.0–0.1)
Abs Eosinophils: 0.19 10*9/L (ref 0.0–0.4)
Abs Imm Granulocytes: 0.06 10*9/L (ref <0.1)
Abs Lymphocytes: 1.43 10*9/L (ref 1.0–3.4)
Abs Monocytes: 0.45 10*9/L (ref 0.2–0.8)
Abs Neutrophils: 2.13 10*9/L (ref 1.8–6.8)
Hematocrit: 43.5 % (ref 36–46)
Hemoglobin: 14.1 g/dL (ref 12.0–15.5)
MCH: 29.9 pg (ref 26–34)
MCHC: 32.4 g/dL (ref 31–36)
MCV: 92 fL (ref 80–100)
Platelet Count: 186 10*9/L (ref 140–450)
RBC Count: 4.71 10*12/L (ref 4.0–5.2)
WBC Count: 4.3 10*9/L (ref 3.4–10)

## 2018-01-10 LAB — COMPREHENSIVE METABOLIC PANEL
AST: 19 U/L (ref 17–42)
Alanine transaminase: 27 U/L (ref 11–50)
Albumin, Serum / Plasma: 3.8 g/dL (ref 3.5–4.8)
Alkaline Phosphatase: 81 U/L (ref 31–95)
Anion Gap: 10 (ref 4–14)
Bilirubin, Total: 1.3 mg/dL (ref 0.2–1.3)
Calcium, total, Serum / Plasma: 9.1 mg/dL (ref 8.8–10.3)
Carbon Dioxide, Total: 24 mmol/L (ref 22–32)
Chloride, Serum / Plasma: 105 mmol/L (ref 97–108)
Creatinine: 0.72 mg/dL (ref 0.44–1.00)
Glucose, non-fasting: 100 mg/dL (ref 70–199)
Potassium, Serum / Plasma: 3.2 mmol/L — ABNORMAL LOW (ref 3.5–5.1)
Protein, Total, Serum / Plasma: 7.4 g/dL (ref 6.0–8.4)
Sodium, Serum / Plasma: 139 mmol/L (ref 135–145)
Urea Nitrogen, Serum / Plasma: 11 mg/dL (ref 6–22)
eGFR - high estimate: 108 mL/min (ref >60)
eGFR - low estimate: 93 mL/min (ref >60)

## 2018-01-10 MED ORDER — LORAZEPAM 1 MG TABLET
1 | ORAL_TABLET | Freq: Every evening | ORAL | 0 refills | 10.00000 days | Status: AC | PRN
Start: 2018-01-10 — End: 2020-10-14

## 2018-01-10 NOTE — Progress Notes (Signed)
Ariel Braun  [id number]    Date of Service:   01/10/18      Ariel Braun is a 57 y.o. female with a history of myeloma, well-controlled on Rev/dex for 2 years now, who is here for follow-up and recommendations for management.    For review the patient history is as follows:     IgG kappa myeloma, diagnosed 09/07/10  Significant bone involvement, 8% plasma cells, normal cyto 46XX, FISH showed only 3 copies 1q21, Stage 2 ISS (3.88 beta 2 microglobulin),IgG 4750, M-spike 3.2, kappa 586.9 mg/L.  RVD 10/2010 through April 2011 (? 4 cycles). Achieved VGPR with M-spike 0.4, IgG 886, kappa 27.6.  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  8 months post transplant, her M-spike continues to fall (0.2 on 11/26/11), she's doing great.    She has intermittent pain right hip, with x-ray showing old lesion. Would recommend PET/CT to rule-out persistent disease. She is concerned about extra radiation, but is so anxious about her myeloma, that I think getting the scan will reassure her.    01/21/12: M-spike 0.3, kappa 16.5, IgG 810. PET/CT neg.    Swelling of feet and nausea (maybe HAE or zometa).     Then had HAE attacks x 2 with abdominal pain and vomiting.     Observation and no bisphos 2/2 her concerns.    08/11/12: Stable M-protein 0.2, IgG 729, kappa 12.3 in August and IgG 723.     09/29/12: MRI shoulder no myeloma. Kappa 17.     12/08/12: HAE attacks more frequent (weekly) but less severe. To receive C 1 esterase inhibitor (Berinert) prior to receiving pneumovax, TdAP, and hep B (12/11/12).   12/18/12. Slow rise in para-protein to M-spike 0.4  01/26/13: Did well with immunizations with Berinert pre-med. Hasn't used [employer] since.    03/14/13: PET/CT no uptake. Meanwhile, M-spike of 0.4, IgG 937, kappa 18.5.    03/30/13: M-spike 0.6, IgG 1190, kappa 26.7.    06/12/13: No HAE. M-spike 0.6, IgG 1270.     08/24/13: M-spike 0.6, IG 1330, kappa 32.3. Continue to observe.     11/30/13: M-spike  0.7. Kappa 32.9. Stable.     03/29/14: MRI of hip no myeloma. M-spike 1.1.     06/11/14: PET normal.     08/05/14: Started Rev 15 mg 3/4. M-spike 1, IgG 1640, kappa 62.2    09/23/14: Seeing [name]. Less pain in feet since Rev.M-spike 0.8, IgG 1532, kappa 62    11/22/14: Nice response to Rev only at 15 mg 3/4. Cont same.     02/17/15: Kappa up a bit to 79.6. Proposed adding Ixazomib to Rev dex if she gets to kappa 100. Don't think Elo would be a good option and Dara not yet necessary. Another option would be a Carfilzomib combo but would prefer to hold off as long as her relapse is just biochemical.    06/10/15: Kappa 76.9 Stable on Rev. Although ANC however at about 0.8 to 1, so would watch carefully.    09/16/15: Kappa 69.9 (down) but ANC 0.8. Will reduce Revlimid to 15 mg qod to allow ANC to rise a bit and I hope this does not impact the Kappa.    12/09/15: Feels much better on Rev 15 mg qod, but M-spike up to 1.1 and IgG 1660. Will add dex 8 mg weekly and ativan for anxiety and sleep.    04/06/16: Now on dex 4 mg  two days in a row, Rev 15 mg q o d.   06/04/16: Kappa 30.1. Cont. QOD Rev '15mg'$ . PET scan ordered and scheduled.    08/20/16: Feels fine. Myeloma labs stable with M-spike 1.1, IgG 1500, kappa 59. PET/CT neg from 7/28. Plan to continue Rev 15 mg qod, Dex 8 mg qwk.    12/14/16: Minimal left flank pain which is rare and intermittent. No hematuria. Myeloma labs better if anything with M-spike 0.9 and Kappa 44.2. Continue Rev 15 mg qod and dex 8 mg q week.    03/15/17: Feels well. Pain gone. IgG 1530. Continue Rev 15 mg qod and dex 8 mg weekly.    04/17/17: Will start dex in AM and Rev at night.    06/21/17: Stable M-spike 1.2, IgG 1480, kappa 58.9. Continue Rev 15 mg qod and dex 8 mg q wk.     09/16/17: M-spike stable 1.2. Kappa 52.6. Continue Rev 15 mg qod. Dex 8 mg q wk    09/16/17: Stable. M-spike 0.2, kappa 59. Continue Rev 15 mg qod and dex 8 mg q wk. Note: no HAE episodes in 2 years.    01/10/18:  No new symptoms.   Stable myeloma.  Continue Rev 15 mg qod and dex 8 mg q week.      Interval History: no new episodes of HAE    Past medical, family and social histories, as well as current medications and allergies, were reviewed and updated in the medical record as appropriate.         Review of Systems - Positive findings in bold  General: reviewed and is non-contributory to the illness/disease  Ophthalmic: reviewed and is non-contributory to the illness/disease  ENT: reviewed and is non-contributory to the illness/disease  Hematological and Lymphatic: reviewed and is non-contributory to the illness/disease  Respiratory: no cough, shortness of breath, or wheezing  Cardiovascular: no chest pain or dyspnea on exertion  Gastrointestinal: no abdominal pain, change in bowel habits, or black or bloody stools  Musculoskeletal:   no bone pain. Otherwise, reviewed and is non-contributory to the illness/disease  Neurological: no TIA or stroke symptoms.  No PN.  Dermatological: reviewed and is non-contributory to the illness/disease  The remainder of the systems were reviewed and are non-contributory to the illness/disease    Physical Exam  KPS 100  ECOG 0   Constitutional: Pleasant. NAD  HENT: Oropharynx is clear and moist. No oral lesions/ulcerations   Eyes: Conjunctivae and EOM are normal. Pupils are equal, round, and reactive to light.   Neck: Neck supple. No JVD present. No thyromegaly present.  Lymph nodes:  negative   Cardiovascular:Nl S1S2, Regular rhythm and no murmurs.    Pulmonary/Chest:Normal resp rate, Breath sounds normal.   Abdominal: Soft and round, No HSM, no mass. There is no tenderness. There is no rebound and no guarding.    Musculoskeletal: Normal range of motion.   Lymphadenopathy: no cervical, inguinal adenopathy.   Neurological: Alert and oriented to person, place, and time. Motor/sensory nonfocal   MS:    No spine tenderness  Extremities: no clubbing, cyanosis or edema  Skin: Skin is warm and dry. No rash.    Psychiatric:  Normal mood and affect.     Lab Results   Component Value Date    WBC Count 4.3 01/10/2018    Hemoglobin 14.1 01/10/2018    Hematocrit 43.5 01/10/2018    MCV 92 01/10/2018    Platelet Count 186 01/10/2018     Lab Results  Component Value Date    Albumin, Serum / Plasma 3.8 01/10/2018      Lab Results   Component Value Date    Creatinine 0.72 01/10/2018    Creatinine Whole Blood 0.8 06/05/2014      Lab Results   Component Value Date    Sodium, Serum / Plasma 139 01/10/2018    Potassium, Serum / Plasma 3.2 (L) 01/10/2018    Chloride, Serum / Plasma 105 01/10/2018    Carbon Dioxide, Total 24 01/10/2018     No results found for: TPUR  Prot Electrophoresis   Date Value Ref Range Status   01/10/2018 PENDING Normal pattern. Incomplete   09/16/2017 Abnormal (A) Normal pattern. 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 Paraprotein spike =  1.2 g/dL.  Interpretation by Pathologist: Clelia Schaumann, M.D.     06/16/2017 Abnormal (A) Normal pattern. 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 Paraprotein spike =  1.2 g/dL.  Interpretation by Pathologist: Clelia Schaumann, M.D.       Results for orders placed or performed in visit on 09/16/17   Kappa and Lambda Free light Chains, Serum   Result Value Ref Range    Kappa Lt Chain, Free 52.6 (H) 3.3 - 19.4 mg/L    Lambda Lt Chain, Free 6.3 5.7 - 26.3 mg/L    Kappa/Lambda Ratio 8.35 (H) 0.26 - 1.65   Results for orders placed or performed during the hospital encounter of 06/16/17   Kappa and Lambda Free light Chains, Serum   Result Value Ref Range    Kappa Lt Chain, Free 58.9 (H) 3.3 - 19.4 mg/L    Lambda Lt Chain, Free 8.6 5.7 - 26.3 mg/L    Kappa/Lambda Ratio 6.85 (H) 0.26 - 1.65   Results for orders placed or performed in visit on 03/15/17   Kappa and Lambda Free light Chains, Serum   Result Value Ref Range    Kappa Lt Chain, Free 46.4 (H) 3.3 - 19.4  mg/L    Lambda Lt Chain, Free 5.3 (L) 5.7 - 26.3 mg/L    Kappa/Lambda Ratio 8.75 (H) 0.26 - 1.65     Lab Results   Component Value Date    IgA, serum 44 (L) 01/29/2016    IgA, serum 45 (L) 12/02/2015    IgA, serum 51 (L) 09/03/2015    IgG, serum 1,440 09/16/2017    IgG, serum 1,480 06/16/2017    IgG, serum 1,530 03/15/2017    IgM, serum 33 (L) 01/29/2016    IgM, serum 35 (L) 12/02/2015    IgM, serum 37 (L) 09/03/2015               ASSESSMENT and PLAN:    Response to therapy (if on therapy) is: SD       1) Myeloma (HCC)    No new symptoms.  Stable myeloma.  Continue Rev 15 mg qod and dex 8 mg q week.           2)  Pain and other symptoms: none      3)  Labs that need attention: none      4)  New problem: son has parathyroid adenoma and calcium stones      5)  Referrals: none      6)  Plan for therapy: Continue Rev/dex  This patient has illness that poses a threat to life or bodily function and is / has been or will be undergoing drug therapy requiring intensive monitoring for toxicity.   I spent 30 minutes face to face with the patient of which 25 minutes of that time were spent in consultation and discussion and counseling regarding the diagnosis, the treatment plan, medication risks, lifestyle modification and symptoms.

## 2018-01-10 NOTE — Assessment & Plan Note (Signed)
01/10/18:  No new symptoms.  Stable myeloma.  Continue Rev 15 mg qod and dex 8 mg q week.

## 2018-01-11 LAB — IGG, SERUM: IgG, serum: 1480 mg/dL (ref 672–1760)

## 2018-01-13 LAB — PROTEIN ELECTROPHORESIS, SERUM
Albumin, SPEP: 3.5 g/dL (ref 3.4–4.7)
Alpha 1 Globulin: 0.3 g/dL (ref 0.1–0.3)
Alpha 2 Globulin: 0.7 g/dL (ref 0.6–1.0)
Beta Globulin: 1 g/dL (ref 0.7–1.4)
Gamma Globulin: 1.4 g/dL (ref 0.6–1.6)
Paraprotein Concentration: 1.2 g/dL — AB
Prot Electrophoresis: ABNORMAL — AB
Total Protein: 6.9 g/dL (ref 6.0–8.1)

## 2018-01-13 LAB — KAPPA AND LAMBDA FREE LIGHT CH
Kappa Lt Chain, Free: 53.6 mg/L — ABNORMAL HIGH (ref 3.3–19.4)
Kappa/Lambda Ratio: 8.51 — ABNORMAL HIGH (ref 0.26–1.65)
Lambda Lt Chain, Free: 6.3 mg/L (ref 5.7–26.3)

## 2018-03-10 ENCOUNTER — Ambulatory Visit: Admit: 2018-03-10 | Discharge: 2018-03-11 | Payer: PRIVATE HEALTH INSURANCE

## 2018-03-13 ENCOUNTER — Ambulatory Visit: Admit: 2018-03-13 | Discharge: 2018-03-14 | Payer: PRIVATE HEALTH INSURANCE

## 2018-03-14 ENCOUNTER — Ambulatory Visit: Admit: 2018-03-14 | Discharge: 2018-03-15 | Payer: PRIVATE HEALTH INSURANCE

## 2018-03-14 ENCOUNTER — Ambulatory Visit: Admit: 2018-03-14 | Discharge: 2018-03-14 | Payer: PRIVATE HEALTH INSURANCE

## 2018-03-17 NOTE — Telephone Encounter (Signed)
Ariel Braun called to let me know that she was recently admitted at [facility]) and that Dr. Cammie Mcgee has been trying to get in touch with Dr. Eliberto Ivory to discuss her care.  I am not aware of any messages (was away yesterday and on Monday) and do not see any telephone encounters.    Patient was reportedly diagnosed with DVTs and PE - currently on eliquis 10 mg twice a day for 5 days followed by 5mg  twice day; cefpodoxime 200mg  BID and doxycycline 100mg  BID for 5 days each.  Her last dose of revlimid and dexamethasone was on 4/28 - both medications are currently on hold until Ariel Braun hears from Dr. Eliberto Ivory to resume.    I attempted to speak to Dr. Cammie Mcgee (Ph: [phone]) and left Dr. Samuel Germany cell phone number (to be used starting Monday) and my number for him to call if needed.    This note will be routed to Dr. Eliberto Ivory.

## 2018-03-23 NOTE — Telephone Encounter (Signed)
Pt called & left vm to f/u on her previous conversation with Dani Gobble from 5/3 and requested to talk to Dr. Eliberto Ivory about her recent admission and when to restart chemo. Call back # [phone].    CC'ed:  * Dr. Eliberto Ivory - pt is requesting a call back from you re her recent admission & when to restart chemo. Please call her back at [phone] and let me know if f/u is needed from me for this pt. Thanks!  Karlene Lineman - pls let pt know her message has been forwarded to Dr. Eliberto Ivory with her call back number. Thank you!  [address]) [place], [place]

## 2018-04-05 ENCOUNTER — Ambulatory Visit: Admit: 2018-04-05 | Discharge: 2018-04-05 | Payer: PRIVATE HEALTH INSURANCE

## 2018-04-05 DIAGNOSIS — C9000 Multiple myeloma not having achieved remission: Secondary | ICD-10-CM

## 2018-04-05 LAB — COMPREHENSIVE METABOLIC PANEL
AST: 31 U/L (ref 17–42)
Alanine transaminase: 36 U/L (ref 11–50)
Albumin, Serum / Plasma: 3.8 g/dL (ref 3.5–4.8)
Alkaline Phosphatase: 92 U/L (ref 31–95)
Anion Gap: 11 (ref 4–14)
Bilirubin, Total: 1.3 mg/dL (ref 0.2–1.3)
Calcium, total, Serum / Plasma: 9.3 mg/dL (ref 8.8–10.3)
Carbon Dioxide, Total: 21 mmol/L — ABNORMAL LOW (ref 22–32)
Chloride, Serum / Plasma: 105 mmol/L (ref 97–108)
Creatinine: 0.67 mg/dL (ref 0.44–1.00)
Glucose, non-fasting: 93 mg/dL (ref 70–199)
Potassium, Serum / Plasma: 3.6 mmol/L (ref 3.5–5.1)
Protein, Total, Serum / Plasma: 7.2 g/dL (ref 6.0–8.4)
Sodium, Serum / Plasma: 137 mmol/L (ref 135–145)
Urea Nitrogen, Serum / Plasma: 12 mg/dL (ref 6–22)
eGFR - high estimate: 112 mL/min (ref >60)
eGFR - low estimate: 97 mL/min (ref >60)

## 2018-04-05 LAB — COMPLETE BLOOD COUNT WITH DIFF
Abs Basophils: 0.03 10*9/L (ref 0.0–0.1)
Abs Eosinophils: 0.13 10*9/L (ref 0.0–0.4)
Abs Imm Granulocytes: 0.03 10*9/L (ref <0.1)
Abs Lymphocytes: 1.58 10*9/L (ref 1.0–3.4)
Abs Monocytes: 0.58 10*9/L (ref 0.2–0.8)
Abs Neutrophils: 1.7 10*9/L — ABNORMAL LOW (ref 1.8–6.8)
Hematocrit: 38.6 % (ref 36–46)
Hemoglobin: 12.6 g/dL (ref 12.0–15.5)
MCH: 30 pg (ref 26–34)
MCHC: 32.6 g/dL (ref 31–36)
MCV: 92 fL (ref 80–100)
Platelet Count: 175 10*9/L (ref 140–450)
RBC Count: 4.2 10*12/L (ref 4.0–5.2)
WBC Count: 4.1 10*9/L (ref 3.4–10)

## 2018-04-06 LAB — IGG, SERUM: IgG, serum: 1680 mg/dL (ref 672–1760)

## 2018-04-07 LAB — PROTEIN ELECTROPHORESIS, SERUM
Albumin, SPEP: 3.5 g/dL (ref 3.4–4.7)
Alpha 1 Globulin: 0.3 g/dL (ref 0.1–0.3)
Alpha 2 Globulin: 0.7 g/dL (ref 0.6–1.0)
Beta Globulin: 1.1 g/dL (ref 0.7–1.4)
Gamma Globulin: 1.5 g/dL (ref 0.6–1.6)
Paraprotein Concentration: 1.4 g/dL — AB
Prot Electrophoresis: ABNORMAL — AB
Total Protein: 7.2 g/dL (ref 6.0–8.1)

## 2018-04-07 LAB — IMMUNOFIXATION ELECTROPHORESIS

## 2018-04-07 LAB — KAPPA AND LAMBDA FREE LIGHT CH
Kappa Lt Chain, Free: 85.2 mg/L — ABNORMAL HIGH (ref 3.3–19.4)
Kappa/Lambda Ratio: 15.78 — ABNORMAL HIGH (ref 0.26–1.65)
Lambda Lt Chain, Free: 5.4 mg/L — ABNORMAL LOW (ref 5.7–26.3)

## 2018-04-11 ENCOUNTER — Ambulatory Visit: Admit: 2018-04-11 | Discharge: 2018-04-11 | Payer: PRIVATE HEALTH INSURANCE

## 2018-04-11 DIAGNOSIS — Z9484 Stem cells transplant status: Secondary | ICD-10-CM

## 2018-04-11 DIAGNOSIS — R0602 Shortness of breath: Secondary | ICD-10-CM

## 2018-04-11 DIAGNOSIS — D848 Other specified immunodeficiencies: Secondary | ICD-10-CM

## 2018-04-11 DIAGNOSIS — C9000 Multiple myeloma not having achieved remission: Secondary | ICD-10-CM

## 2018-04-11 MED ORDER — DEXAMETHASONE 4 MG TABLET
4 | ORAL_TABLET | ORAL | 3 refills | Status: DC
Start: 2018-04-11 — End: 2019-03-20

## 2018-04-11 NOTE — Progress Notes (Signed)
Ariel Braun  MW:[id number]    Date of Service:   04/11/18      Ariel Braun is a 57 y.o. female with a history of myeloma and recent PE's, who is here for follow-up and management.    For review the patient history is as follows:     IgG kappa myeloma, diagnosed 09/07/10  Significant bone involvement, 8% plasma cells, normal cyto 46XX, FISH showed only 3 copies 1q21, Stage 2 ISS (3.88 beta 2 microglobulin),IgG 4750, M-spike 3.2, kappa 586.9 mg/L.  RVD 10/2010 through April 2011 (? 4 cycles). Achieved VGPR with M-spike 0.4, IgG 886, kappa 27.6.  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  8 months post transplant, her M-spike continues to fall (0.2 on 11/26/11), she's doing great.    She has intermittent pain right hip, with x-ray showing old lesion. Would recommend PET/CT to rule-out persistent disease. She is concerned about extra radiation, but is so anxious about her myeloma, that I think getting the scan will reassure her.    01/21/12: M-spike 0.3, kappa 16.5, IgG 810. PET/CT neg.    Swelling of feet and nausea (maybe HAE or zometa).     Then had HAE attacks x 2 with abdominal pain and vomiting.     Observation and no bisphos 2/2 her concerns.    08/11/12: Stable M-protein 0.2, IgG 729, kappa 12.3 in August and IgG 723.     09/29/12: MRI shoulder no myeloma. Kappa 17.     12/08/12: HAE attacks more frequent (weekly) but less severe. To receive C 1 esterase inhibitor (Berinert) prior to receiving pneumovax, TdAP, and hep B (12/11/12).   12/18/12. Slow rise in para-protein to M-spike 0.4  01/26/13: Did well with immunizations with Berinert pre-med. Hasn't used [employer] since.    03/14/13: PET/CT no uptake. Meanwhile, M-spike of 0.4, IgG 937, kappa 18.5.    03/30/13: M-spike 0.6, IgG 1190, kappa 26.7.    06/12/13: No HAE. M-spike 0.6, IgG 1270.     08/24/13: M-spike 0.6, IG 1330, kappa 32.3. Continue to observe.     11/30/13: M-spike 0.7. Kappa 32.9. Stable.     03/29/14: MRI of hip no  myeloma. M-spike 1.1.     06/11/14: PET normal.     08/05/14: Started Rev 15 mg 3/4. M-spike 1, IgG 1640, kappa 62.2    09/23/14: Seeing [name]. Less pain in feet since Rev.M-spike 0.8, IgG 1532, kappa 62    11/22/14: Nice response to Rev only at 15 mg 3/4. Cont same.     02/17/15: Kappa up a bit to 79.6. Proposed adding Ixazomib to Rev dex if she gets to kappa 100. Don't think Elo would be a good option and Dara not yet necessary. Another option would be a Carfilzomib combo but would prefer to hold off as long as her relapse is just biochemical.    06/10/15: Kappa 76.9 Stable on Rev. Although ANC however at about 0.8 to 1, so would watch carefully.    09/16/15: Kappa 69.9 (down) but ANC 0.8. Will reduce Revlimid to 15 mg qod to allow ANC to rise a bit and I hope this does not impact the Kappa.    12/09/15: Feels much better on Rev 15 mg qod, but M-spike up to 1.1 and IgG 1660. Will add dex 8 mg weekly and ativan for anxiety and sleep.    04/06/16: Now on dex 4 mg two days in a row, Rev  15 mg q o d.   06/04/16: Kappa 30.1. Cont. QOD Rev 66m. PET scan ordered and scheduled.    08/20/16: Feels fine. Myeloma labs stable with M-spike 1.1, IgG 1500, kappa 59. PET/CT neg from 7/28. Plan to continue Rev 15 mg qod, Dex 8 mg qwk.    12/14/16: Minimal left flank pain which is rare and intermittent. No hematuria. Myeloma labs better if anything with M-spike 0.9 and Kappa 44.2. Continue Rev 15 mg qod and dex 8 mg q week.    03/15/17: Feels well. Pain gone. IgG 1530. Continue Rev 15 mg qod and dex 8 mg weekly.    04/17/17: Will start dex in AM and Rev at night.    06/21/17: Stable M-spike 1.2, IgG 1480, kappa 58.9. Continue Rev 15 mg qod and dex 8 mg q wk.     09/16/17: M-spike stable 1.2. Kappa 52.6. Continue Rev 15 mg qod. Dex 8 mg q wk    09/16/17: Stable. M-spike 0.2, kappa 59. Continue Rev 15 mg qod and dex 8 mg q wk. Note: no HAE episodes in 2 years.    01/10/18: No new symptoms. Stable myeloma. Continue Rev 15 mg qod and dex 8 mg q  week.    03/19/18: Multiple PEs. Placed on Eliquis. Hold Rev.    04/11/18:  M-spike 1.4, kappa 85.  Resume Rev at 15 mg daily x 28 days, then qod,  Continue dex 8 mg qwk.  Had pneumonia as well as PE's in early May.  Need to repeat CT chest due to a mass (3.4 cm x 1.6 cm) at left posterior sulcus.      Interval History: PEs and ? [facility]neum[name]   Past medical, family and social histories, as well as current medications and allergies, were reviewed and updated in the medical record as appropriate.         Review of Systems - Positive findings in bold  General: reviewed and is non-contributory to the illness/disease  Ophthalmic: reviewed and is non-contributory to the illness/disease  ENT: reviewed and is non-contributory to the illness/disease  Hematological and Lymphatic: reviewed and is non-contributory to the illness/disease  Respiratory: still has some SOB post pneumonia  Cardiovascular: no chest pain or dyspnea on exertion  Gastrointestinal: no abdominal pain, change in bowel habits, or black or bloody stools  Musculoskeletal:   no bone pain. Otherwise, reviewed and is non-contributory to the illness/disease  Neurological: no TIA or stroke symptoms.  No PN.  Dermatological: reviewed and is non-contributory to the illness/disease  The remainder of the systems were reviewed and are non-contributory to the illness/disease    Physical Exam  KPS 100  ECOG 0   Constitutional: Pleasant. NAD  HENT: Oropharynx is clear and moist. No oral lesions/ulcerations   Eyes: Conjunctivae and EOM are normal. Pupils are equal, round, and reactive to light.   Neck: Neck supple. No JVD present. No thyromegaly present.  Lymph nodes:  negative   Cardiovascular:Nl S1S2, Regular rhythm and no murmurs.    Pulmonary/Chest:Normal resp rate, Breath sounds normal.   Abdominal: Soft and round, No HSM, no mass. There is no tenderness. There is no rebound and no guarding.    Musculoskeletal: Normal range of motion.   Lymphadenopathy: no  cervical, inguinal adenopathy.   Neurological: Alert and oriented to person, place, and time. Motor/sensory nonfocal   MS:    No spine tenderness  Extremities: no clubbing, cyanosis or edema  Skin: Skin is warm and dry. No  rash.   Psychiatric:  Normal mood and affect.     Lab Results   Component Value Date    WBC Count 4.1 04/05/2018    Hemoglobin 12.6 04/05/2018    Hematocrit 38.6 04/05/2018    MCV 92 04/05/2018    Platelet Count 175 04/05/2018     Lab Results   Component Value Date    Albumin, Serum / Plasma 3.8 04/05/2018      Lab Results   Component Value Date    Creatinine 0.67 04/05/2018    Creatinine Whole Blood 0.8 06/05/2014    Creatinine, Serum 0.70 03/15/2018      Lab Results   Component Value Date    Sodium, Serum / Plasma 137 04/05/2018    Potassium, Serum / Plasma 3.6 04/05/2018    Chloride, Serum / Plasma 105 04/05/2018    Carbon Dioxide, Total 21 (L) 04/05/2018     No results found for: TPUR  Prot Electrophoresis   Date Value Ref Range Status   04/05/2018 Abnormal (A) Normal pattern. 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 Paraprotein spike =  1.4 g/dL.  Interpretation by Pathologist: Clelia Schaumann, M.D.     01/10/2018 Abnormal (A) Normal pattern. 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 Paraprotein spike = 1.2 g/dL.  Interpretation by Pathologist: Clelia Schaumann, M.D.     09/16/2017 Abnormal (A) Normal pattern. 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 Paraprotein spike =  1.2 g/dL.  Interpretation by Pathologist: Clelia Schaumann, M.D.       Results for orders placed or performed in visit on 04/05/18   Kappa and Lambda Free light Chains, Serum   Result Value Ref Range    Kappa Lt Chain, Free 85.2 (H) 3.3 - 19.4 mg/L    Lambda Lt Chain, Free 5.4 (L) 5.7 - 26.3 mg/L    Kappa/Lambda Ratio 15.78 (H) 0.26 - 1.65   Results  for orders placed or performed in visit on 03/14/18   Kappa and Lambda Free light Chains, Serum   Result Value Ref Range    Kappa Lt Chain, Free 52.4 (H) 3.3 - 19.4 mg/L    Lambda Light Chain, Free 5.4 (L) 5.7 - 26.3 mg/L    Kappa/Lambda Ratio 9.70 (H) 0.26 - 1.65   Results for orders placed or performed in visit on 01/10/18   Kappa and Lambda Free light Chains, Serum   Result Value Ref Range    Kappa Lt Chain, Free 53.6 (H) 3.3 - 19.4 mg/L    Lambda Lt Chain, Free 6.3 5.7 - 26.3 mg/L    Kappa/Lambda Ratio 8.51 (H) 0.26 - 1.65     Lab Results   Component Value Date    IgA, serum 22 (L) 03/16/2018    IgA, serum 44 (L) 01/29/2016    IgA, serum 45 (L) 12/02/2015    IgG, serum 1,680 04/05/2018    IgG, serum 1178 03/16/2018    IgG, serum 1,480 01/10/2018    IgM, serum 25 (L) 03/16/2018    IgM, serum 33 (L) 01/29/2016    IgM, serum 35 (L) 12/02/2015               ASSESSMENT and PLAN:    Response to therapy (if on therapy)  is: PD       1) Myeloma ([place])    M-spike 1.4, kappa 85.  Resume Rev at 15 mg daily x 28 days, then qod.  Continue dex 8 mg qwk.        2)  Pain and other symptoms:     mild SOB      Had pneumonia as well as PE's in early May.  Need to repeat CT chest due to a mass (3.4 cm x 1.6 cm) at left posterior sulcus.     3)  Labs that need attention: M-spike and kappa      4)  New problem: ? Mass on CT chest      5)  Referrals: none      6)  Plan for therapy: Resume Rev at 15 mg daily and dex at 8 mg weekly for a month, then decrease Rev to 15 mg qod.          This patient has illness that poses a threat to life or bodily function and is / has been or will be undergoing drug therapy requiring intensive monitoring for toxicity.   I spent 45 minutes face to face with the patient of which 35 minutes of that time were spent in consultation and discussion and counseling regarding the diagnosis, the treatment plan, medication risks, lifestyle modification and symptoms.

## 2018-04-11 NOTE — Assessment & Plan Note (Signed)
M-spike 1.4, kappa 85.  Resume Rev at 15 mg daily x 28 days, then qod.  Continue dex 8 mg qwk.      Had pneumonia as well as PE's in early May.  Need to repeat CT chest due to a mass (3.4 cm x 1.6 cm) at left posterior sulcus.

## 2018-05-17 ENCOUNTER — Ambulatory Visit: Admit: 2018-05-17 | Discharge: 2018-05-17 | Payer: PRIVATE HEALTH INSURANCE

## 2018-05-17 ENCOUNTER — Ambulatory Visit: Admit: 2018-05-17 | Discharge: 2018-05-18 | Payer: PRIVATE HEALTH INSURANCE | Attending: Diagnostic Radiology

## 2018-05-17 DIAGNOSIS — C9000 Multiple myeloma not having achieved remission: Secondary | ICD-10-CM

## 2018-05-17 LAB — COMPREHENSIVE METABOLIC PANEL
AST: 18 U/L (ref 17–42)
Alanine transaminase: 24 U/L (ref 11–50)
Albumin, Serum / Plasma: 3.7 g/dL (ref 3.5–4.8)
Alkaline Phosphatase: 78 U/L (ref 31–95)
Anion Gap: 10 (ref 4–14)
Bilirubin, Total: 1.9 mg/dL — ABNORMAL HIGH (ref 0.2–1.3)
Calcium, total, Serum / Plasma: 9.1 mg/dL (ref 8.8–10.3)
Carbon Dioxide, Total: 22 mmol/L (ref 22–32)
Chloride, Serum / Plasma: 107 mmol/L (ref 97–108)
Creatinine: 0.65 mg/dL (ref 0.44–1.00)
Glucose, non-fasting: 92 mg/dL (ref 70–199)
Potassium, Serum / Plasma: 3.3 mmol/L — ABNORMAL LOW (ref 3.5–5.1)
Protein, Total, Serum / Plasma: 6.9 g/dL (ref 6.0–8.4)
Sodium, Serum / Plasma: 139 mmol/L (ref 135–145)
Urea Nitrogen, Serum / Plasma: 10 mg/dL (ref 6–22)
eGFR - high estimate: 113 mL/min (ref >60)
eGFR - low estimate: 98 mL/min (ref >60)

## 2018-05-17 LAB — COMPLETE BLOOD COUNT WITH DIFF
Abs Basophils: 0.05 10*9/L (ref 0.0–0.1)
Abs Eosinophils: 0.13 10*9/L (ref 0.0–0.4)
Abs Imm Granulocytes: 0.04 10*9/L (ref <0.1)
Abs Lymphocytes: 1.36 10*9/L (ref 1.0–3.4)
Abs Monocytes: 0.25 10*9/L (ref 0.2–0.8)
Abs Neutrophils: 1.15 10*9/L — ABNORMAL LOW (ref 1.8–6.8)
Hematocrit: 39.9 % (ref 36–46)
Hemoglobin: 13 g/dL (ref 12.0–15.5)
MCH: 30.4 pg (ref 26–34)
MCHC: 32.6 g/dL (ref 31–36)
MCV: 93 fL (ref 80–100)
Platelet Count: 149 10*9/L (ref 140–450)
RBC Count: 4.28 10*12/L (ref 4.0–5.2)
WBC Count: 3 10*9/L — ABNORMAL LOW (ref 3.4–10)

## 2018-05-17 MED ORDER — IOHEXOL 350 MG IODINE/ML INTRAVENOUS SOLUTION
350 | Freq: Once | INTRAVENOUS | Status: AC
Start: 2018-05-17 — End: 2018-05-17
  Administered 2018-05-17: 18:00:00 via INTRAVENOUS

## 2018-05-19 LAB — IGG, SERUM: IgG, serum: 1510 mg/dL (ref 672–1760)

## 2018-05-19 LAB — KAPPA AND LAMBDA FREE LIGHT CH
Kappa Lt Chain, Free: 64.4 mg/L — ABNORMAL HIGH (ref 3.3–19.4)
Kappa/Lambda Ratio: 13.7 — ABNORMAL HIGH (ref 0.26–1.65)
Lambda Lt Chain, Free: 4.7 mg/L — ABNORMAL LOW (ref 5.7–26.3)

## 2018-05-20 LAB — PROTEIN ELECTROPHORESIS, SERUM
Albumin, SPEP: 3.3 g/dL — ABNORMAL LOW (ref 3.4–4.7)
Alpha 1 Globulin: 0.2 g/dL (ref 0.1–0.3)
Alpha 2 Globulin: 0.6 g/dL (ref 0.6–1.0)
Beta Globulin: 0.9 g/dL (ref 0.7–1.4)
Gamma Globulin: 1.5 g/dL (ref 0.6–1.6)
Paraprotein Concentration: 1.3 g/dL — AB
Prot Electrophoresis: ABNORMAL — AB
Total Protein: 6.5 g/dL (ref 6.0–8.1)

## 2018-05-20 LAB — IMMUNOFIXATION ELECTROPHORESIS

## 2018-05-23 ENCOUNTER — Ambulatory Visit: Admit: 2018-05-23 | Discharge: 2018-05-23 | Payer: PRIVATE HEALTH INSURANCE

## 2018-05-23 DIAGNOSIS — C9000 Multiple myeloma not having achieved remission: Secondary | ICD-10-CM

## 2018-05-23 DIAGNOSIS — R0781 Pleurodynia: Secondary | ICD-10-CM

## 2018-05-23 DIAGNOSIS — R079 Chest pain, unspecified: Secondary | ICD-10-CM

## 2018-05-23 DIAGNOSIS — Z9484 Stem cells transplant status: Secondary | ICD-10-CM

## 2018-05-23 DIAGNOSIS — C9002 Multiple myeloma in relapse: Secondary | ICD-10-CM

## 2018-05-23 DIAGNOSIS — D848 Other specified immunodeficiencies: Secondary | ICD-10-CM

## 2018-05-23 NOTE — Progress Notes (Signed)
Ariel Braun  [id number]    Date of Service:   05/23/18      Ariel Braun is a 57 y.o. female with a history of myeloma, stable on Rev/dex but recent PEs on ASA, now on Eliquis, who is here for follow-up and management.    For review the patient history is as follows:     IgG kappa myeloma, diagnosed 09/07/10  Significant bone involvement, 8% plasma cells, normal cyto 46XX, FISH showed only 3 copies 1q21, Stage 2 ISS (3.88 beta 2 microglobulin),IgG 4750, M-spike 3.2, kappa 586.9 mg/L.  RVD 10/2010 through April 2011 (? 4 cycles). Achieved VGPR with M-spike 0.4, IgG 886, kappa 27.6.  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  8 months post transplant, her M-spike continues to fall (0.2 on 11/26/11), she's doing great.    She has intermittent pain right hip, with x-ray showing old lesion. Would recommend PET/CT to rule-out persistent disease. She is concerned about extra radiation, but is so anxious about her myeloma, that I think getting the scan will reassure her.    01/21/12: M-spike 0.3, kappa 16.5, IgG 810. PET/CT neg.    Swelling of feet and nausea (maybe HAE or zometa).     Then had HAE attacks x 2 with abdominal pain and vomiting.     Observation and no bisphos 2/2 her concerns.    08/11/12: Stable M-protein 0.2, IgG 729, kappa 12.3 in August and IgG 723.     09/29/12: MRI shoulder no myeloma. Kappa 17.     12/08/12: HAE attacks more frequent (weekly) but less severe. To receive C 1 esterase inhibitor (Berinert) prior to receiving pneumovax, TdAP, and hep B (12/11/12).   12/18/12. Slow rise in para-protein to M-spike 0.4  01/26/13: Did well with immunizations with Berinert pre-med. Hasn't used [employer] since.    03/14/13: PET/CT no uptake. Meanwhile, M-spike of 0.4, IgG 937, kappa 18.5.    03/30/13: M-spike 0.6, IgG 1190, kappa 26.7.    06/12/13: No HAE. M-spike 0.6, IgG 1270.     08/24/13: M-spike 0.6, IG 1330, kappa 32.3. Continue to observe.     11/30/13: M-spike 0.7. Kappa  32.9. Stable.     03/29/14: MRI of hip no myeloma. M-spike 1.1.     06/11/14: PET normal.     08/05/14: Started Rev 15 mg 3/4. M-spike 1, IgG 1640, kappa 62.2    09/23/14: Seeing [name]. Less pain in feet since Rev.M-spike 0.8, IgG 1532, kappa 62    11/22/14: Nice response to Rev only at 15 mg 3/4. Cont same.     02/17/15: Kappa up a bit to 79.6. Proposed adding Ixazomib to Rev dex if she gets to kappa 100. Don't think Elo would be a good option and Dara not yet necessary. Another option would be a Carfilzomib combo but would prefer to hold off as long as her relapse is just biochemical.    06/10/15: Kappa 76.9 Stable on Rev. Although ANC however at about 0.8 to 1, so would watch carefully.    09/16/15: Kappa 69.9 (down) but ANC 0.8. Will reduce Revlimid to 15 mg qod to allow ANC to rise a bit and I hope this does not impact the Kappa.    12/09/15: Feels much better on Rev 15 mg qod, but M-spike up to 1.1 and IgG 1660. Will add dex 8 mg weekly and ativan for anxiety and sleep.    04/06/16: Now on dex  4 mg two days in a row, Rev 15 mg q o d.   06/04/16: Kappa 30.1. Cont. QOD Rev 13m. PET scan ordered and scheduled.    08/20/16: Feels fine. Myeloma labs stable with M-spike 1.1, IgG 1500, kappa 59. PET/CT neg from 7/28. Plan to continue Rev 15 mg qod, Dex 8 mg qwk.    12/14/16: Minimal left flank pain which is rare and intermittent. No hematuria. Myeloma labs better if anything with M-spike 0.9 and Kappa 44.2. Continue Rev 15 mg qod and dex 8 mg q week.    03/15/17: Feels well. Pain gone. IgG 1530. Continue Rev 15 mg qod and dex 8 mg weekly.    04/17/17: Will start dex in AM and Rev at night.    06/21/17: Stable M-spike 1.2, IgG 1480, kappa 58.9. Continue Rev 15 mg qod and dex 8 mg q wk.     09/16/17: M-spike stable 1.2. Kappa 52.6. Continue Rev 15 mg qod. Dex 8 mg q wk    09/16/17: Stable. M-spike 0.2, kappa 59. Continue Rev 15 mg qod and dex 8 mg q wk. Note: no HAE episodes in 2 years.    01/10/18: No new symptoms. Stable myeloma.  Continue Rev 15 mg qod and dex 8 mg q week.    03/19/18: Multiple PEs. Placed on Eliquis. Hold Rev.    04/11/18: M-spike 1.4, kappa 85. Resume Rev at 15 mg daily (3/4), then qod, Continue dex 8 mg qwk. Continue Eliquis.  Had pneumonia as well as PE's in early May. Need to repeat CT chest due to a mass (3.4 cm x 1.6 cm) at left posterior sulcus.    05/10/18: Rev 15 mg qod.      05/17/18:  Repeat CT ([place]) with improvement and no mass or continued PEs.  Mild intermittent pain left posterior chest (not pleuritic).  M-spike 1.3, IgG 1510, kappa 64 (stable).      Interval History: no furhter PEs    Past medical, family and social histories, as well as current medications and allergies, were reviewed and updated in the medical record as appropriate.         Review of Systems - Positive findings in bold  General: reviewed and is non-contributory to the illness/disease  Ophthalmic: reviewed and is non-contributory to the illness/disease  ENT: reviewed and is non-contributory to the illness/disease  Hematological and Lymphatic: reviewed and is non-contributory to the illness/disease  Respiratory: no cough, shortness of breath, or wheezing  Cardiovascular: no chest pain or dyspnea on exertion  Gastrointestinal: no abdominal pain, change in bowel habits, or black or bloody stools  Musculoskeletal:  Minimal rare rib pain. Otherwise, reviewed and is non-contributory to the illness/disease  Neurological: no TIA or stroke symptoms.  No PN.  Dermatological: reviewed and is non-contributory to the illness/disease  The remainder of the systems were reviewed and are non-contributory to the illness/disease    Physical Exam  KPS 100  ECOG 0   Constitutional: Pleasant. NAD  HENT: Oropharynx is clear and moist. No oral lesions/ulcerations   Eyes: Conjunctivae and EOM are normal. Pupils are equal, round, and reactive to light.   Neck: Neck supple. No JVD present. No thyromegaly present.  Lymphadenopathy: No cervical, inguinal adenopathy.     Cardiovascular:  Nl S1S2, Regular rhythm and no murmurs.    Pulmonary/Chest:  Normal resp rate, Breath sounds normal.   Abdominal: Soft and round, No HSM, no mass. There is no tenderness. There is no rebound and no guarding.  Musculoskeletal: Normal range of motion.   Neurological: Alert and oriented to person, place, and time. Motor/sensory nonfocal   MS:    No spine tenderness  Extremities: No clubbing, cyanosis or edema  Skin: Skin is warm and dry. No rash.   Psychiatric:  Normal mood and affect.     Lab Results   Component Value Date    WBC Count 3.0 (L) 05/17/2018    Hemoglobin 13.0 05/17/2018    Hematocrit 39.9 05/17/2018    MCV 93 05/17/2018    Platelet Count 149 05/17/2018     Lab Results   Component Value Date    Albumin, Serum / Plasma 3.7 05/17/2018      Lab Results   Component Value Date    Creatinine 0.65 05/17/2018    Creatinine Whole Blood 0.8 06/05/2014    Creatinine, Serum 0.70 03/15/2018      Lab Results   Component Value Date    Sodium, Serum / Plasma 139 05/17/2018    Potassium, Serum / Plasma 3.3 (L) 05/17/2018    Chloride, Serum / Plasma 107 05/17/2018    Carbon Dioxide, Total 22 05/17/2018     No results found for: TPUR  Prot Electrophoresis   Date Value Ref Range Status   05/17/2018 Abnormal (A) Normal pattern. Final     Comment:     Hypoalbuminemia with a protein spike in the gamma region, consistent with a monoclonal gammopathy. Paraprotein spike = 1.3 g/dL.  Interpretation by Pathologist: Clelia Schaumann, M.D.     04/05/2018 Abnormal (A) Normal pattern. 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 Paraprotein spike =  1.4 g/dL.  Interpretation by Pathologist: Clelia Schaumann, M.D.     01/10/2018 Abnormal (A) Normal pattern. 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 Paraprotein spike = 1.2 g/dL.  Interpretation by Pathologist: Clelia Schaumann, M.D.       Results for  orders placed or performed in visit on 05/17/18   Kappa and Lambda Free light Chains, Serum   Result Value Ref Range    Kappa Lt Chain, Free 64.4 (H) 3.3 - 19.4 mg/L    Lambda Lt Chain, Free 4.7 (L) 5.7 - 26.3 mg/L    Kappa/Lambda Ratio 13.70 (H) 0.26 - 1.65   Results for orders placed or performed in visit on 04/05/18   Kappa and Lambda Free light Chains, Serum   Result Value Ref Range    Kappa Lt Chain, Free 85.2 (H) 3.3 - 19.4 mg/L    Lambda Lt Chain, Free 5.4 (L) 5.7 - 26.3 mg/L    Kappa/Lambda Ratio 15.78 (H) 0.26 - 1.65   Results for orders placed or performed in visit on 03/14/18   Kappa and Lambda Free light Chains, Serum   Result Value Ref Range    Kappa Lt Chain, Free 52.4 (H) 3.3 - 19.4 mg/L    Lambda Light Chain, Free 5.4 (L) 5.7 - 26.3 mg/L    Kappa/Lambda Ratio 9.70 (H) 0.26 - 1.65     Lab Results   Component Value Date    IgA, serum 22 (L) 03/16/2018    IgA, serum 44 (L) 01/29/2016    IgA, serum 45 (L) 12/02/2015    IgG, serum 1,510 05/17/2018    IgG, serum 1,680 04/05/2018    IgG, serum 1178 03/16/2018    IgM,  serum 25 (L) 03/16/2018    IgM, serum 33 (L) 01/29/2016    IgM, serum 35 (L) 12/02/2015               ASSESSMENT and PLAN:    Response to therapy (if on therapy) is: SD       1) Myeloma ([place])    Repeat CT ([place]) with improvement and no mass or continued PEs.  Mild intermittent pain left posterior chest (not pleuritic).  M-spike 1.3, IgG 1510, kappa 64 (stable).     2)  Pain and other symptoms: mild rib pain      3)  Labs that need attention: none      4)  New problem: none      5)  Referrals: none      6)  Plan for therapy:     Continue rev 15 mg qod/dex 8 mg weekly          This patient has illness that poses a threat to life or bodily function and is / has been or will be undergoing drug therapy requiring intensive monitoring for toxicity.   I spent 45 minutes face to face with the patient of which 35 minutes of that time were spent in consultation and discussion and counseling regarding the  diagnosis, the treatment plan, medication risks, lifestyle modification and symptoms.

## 2018-05-23 NOTE — Assessment & Plan Note (Signed)
Repeat CT ([place]) with improvement and no mass or continued PEs.  Mild intermittent pain left posterior chest (not pleuritic).  M-spike 1.3, IgG 1510, kappa 64 (stable).

## 2018-08-08 ENCOUNTER — Ambulatory Visit: Admit: 2018-08-08 | Discharge: 2018-08-08 | Payer: PRIVATE HEALTH INSURANCE

## 2018-08-08 DIAGNOSIS — R079 Chest pain, unspecified: Secondary | ICD-10-CM

## 2018-08-08 DIAGNOSIS — C9000 Multiple myeloma not having achieved remission: Secondary | ICD-10-CM

## 2018-08-08 DIAGNOSIS — D848 Other specified immunodeficiencies: Secondary | ICD-10-CM

## 2018-08-08 DIAGNOSIS — Z9484 Stem cells transplant status: Secondary | ICD-10-CM

## 2018-08-08 LAB — COMPREHENSIVE METABOLIC PANEL
AST: 18 U/L (ref 17–42)
Alanine transaminase: 23 U/L (ref 11–50)
Albumin, Serum / Plasma: 3.5 g/dL (ref 3.5–4.8)
Alkaline Phosphatase: 78 U/L (ref 31–95)
Anion Gap: 9 (ref 4–14)
Bilirubin, Total: 1.5 mg/dL — ABNORMAL HIGH (ref 0.2–1.3)
Calcium, total, Serum / Plasma: 9.2 mg/dL (ref 8.8–10.3)
Carbon Dioxide, Total: 23 mmol/L (ref 22–32)
Chloride, Serum / Plasma: 110 mmol/L — ABNORMAL HIGH (ref 97–108)
Creatinine: 0.77 mg/dL (ref 0.44–1.00)
Glucose, non-fasting: 92 mg/dL (ref 70–199)
Potassium, Serum / Plasma: 3.5 mmol/L (ref 3.5–5.1)
Protein, Total, Serum / Plasma: 7.2 g/dL (ref 6.0–8.4)
Sodium, Serum / Plasma: 142 mmol/L (ref 135–145)
Urea Nitrogen, Serum / Plasma: 14 mg/dL (ref 6–22)
eGFR - high estimate: 99 mL/min (ref >60)
eGFR - low estimate: 85 mL/min (ref >60)

## 2018-08-08 LAB — COMPLETE BLOOD COUNT WITH DIFF
Abs Basophils: 0.06 10*9/L (ref 0.0–0.1)
Abs Eosinophils: 0.18 10*9/L (ref 0.0–0.4)
Abs Imm Granulocytes: 0.05 10*9/L (ref <0.1)
Abs Lymphocytes: 1.48 10*9/L (ref 1.0–3.4)
Abs Monocytes: 0.47 10*9/L (ref 0.2–0.8)
Abs Neutrophils: 1.59 10*9/L — ABNORMAL LOW (ref 1.8–6.8)
Hematocrit: 42.3 % (ref 36–46)
Hemoglobin: 13.9 g/dL (ref 12.0–15.5)
MCH: 29.9 pg (ref 26–34)
MCHC: 32.9 g/dL (ref 31–36)
MCV: 91 fL (ref 80–100)
Platelet Count: 206 10*9/L (ref 140–450)
RBC Count: 4.65 10*12/L (ref 4.0–5.2)
WBC Count: 3.8 10*9/L (ref 3.4–10)

## 2018-08-08 NOTE — Progress Notes (Signed)
Ariel Braun  [id number]    Date of Service:   08/08/18      Ariel Braun is a 57 y.o. female with a history of myeloma, stable on Rev/dex, who is here for follow-up and management.    For review the patient history is as follows:     IgG kappa myeloma, diagnosed 09/07/10  Significant bone involvement, 8% plasma cells, normal cyto 46XX, FISH showed only 3 copies 1q21, Stage 2 ISS (3.88 beta 2 microglobulin),IgG 4750, M-spike 3.2, kappa 586.9 mg/L.  RVD 10/2010 through April 2011 (? 4 cycles). Achieved VGPR with M-spike 0.4, IgG 886, kappa 27.6.  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  8 months post transplant, her M-spike continues to fall (0.2 on 11/26/11), she's doing great.    She has intermittent pain right hip, with x-ray showing old lesion. Would recommend PET/CT to rule-out persistent disease. She is concerned about extra radiation, but is so anxious about her myeloma, that I think getting the scan will reassure her.    01/21/12: M-spike 0.3, kappa 16.5, IgG 810. PET/CT neg.    Swelling of feet and nausea (maybe HAE or zometa).     Then had HAE attacks x 2 with abdominal pain and vomiting.     Observation and no bisphos 2/2 her concerns.    08/11/12: Stable M-protein 0.2, IgG 729, kappa 12.3 in August and IgG 723.     09/29/12: MRI shoulder no myeloma. Kappa 17.     12/08/12: HAE attacks more frequent (weekly) but less severe. To receive C 1 esterase inhibitor (Berinert) prior to receiving pneumovax, TdAP, and hep B (12/11/12).   12/18/12. Slow rise in para-protein to M-spike 0.4  01/26/13: Did well with immunizations with Berinert pre-med. Hasn't used [employer] since.    03/14/13: PET/CT no uptake. Meanwhile, M-spike of 0.4, IgG 937, kappa 18.5.    03/30/13: M-spike 0.6, IgG 1190, kappa 26.7.    06/12/13: No HAE. M-spike 0.6, IgG 1270.     08/24/13: M-spike 0.6, IG 1330, kappa 32.3. Continue to observe.     11/30/13: M-spike 0.7. Kappa 32.9. Stable.     03/29/14: MRI of hip  no myeloma. M-spike 1.1.     06/11/14: PET normal.     08/05/14: Started Rev 15 mg 3/4. M-spike 1, IgG 1640, kappa 62.2    09/23/14: Seeing [name]. Less pain in feet since Rev.M-spike 0.8, IgG 1532, kappa 62    11/22/14: Nice response to Rev only at 15 mg 3/4. Cont same.     02/17/15: Kappa up a bit to 79.6. Proposed adding Ixazomib to Rev dex if she gets to kappa 100. Don't think Elo would be a good option and Dara not yet necessary. Another option would be a Carfilzomib combo but would prefer to hold off as long as her relapse is just biochemical.    06/10/15: Kappa 76.9 Stable on Rev. Although ANC however at about 0.8 to 1, so would watch carefully.    09/16/15: Kappa 69.9 (down) but ANC 0.8. Will reduce Revlimid to 15 mg qod to allow ANC to rise a bit and I hope this does not impact the Kappa.    12/09/15: Feels much better on Rev 15 mg qod, but M-spike up to 1.1 and IgG 1660. Will add dex 8 mg weekly and ativan for anxiety and sleep.    04/06/16: Now on dex 4 mg two days in a row, Rev  15 mg q o d.   06/04/16: Kappa 30.1. Cont. QOD Rev '15mg'$ . PET scan ordered and scheduled.    08/20/16: Feels fine. Myeloma labs stable with M-spike 1.1, IgG 1500, kappa 59. PET/CT neg from 7/28. Plan to continue Rev 15 mg qod, Dex 8 mg qwk.    12/14/16: Minimal left flank pain which is rare and intermittent. No hematuria. Myeloma labs better if anything with M-spike 0.9 and Kappa 44.2. Continue Rev 15 mg qod and dex 8 mg q week.    03/15/17: Feels well. Pain gone. IgG 1530. Continue Rev 15 mg qod and dex 8 mg weekly.    04/17/17: Will start dex in AM and Rev at night.    06/21/17: Stable M-spike 1.2, IgG 1480, kappa 58.9. Continue Rev 15 mg qod and dex 8 mg q wk.     09/16/17: M-spike stable 1.2. Kappa 52.6. Continue Rev 15 mg qod. Dex 8 mg q wk    09/16/17: Stable. M-spike 0.2, kappa 59. Continue Rev 15 mg qod and dex 8 mg q wk. Note: no HAE episodes in 2 years.    01/10/18: No new symptoms. Stable myeloma. Continue Rev 15 mg qod and dex 8 mg q  week.    03/19/18: Multiple PEs. Placed on Eliquis. Hold Rev.    04/11/18: M-spike 1.4, kappa 85. Resume Rev at 15 mg daily (3/4), then qod, Continue dex 8 mg qwk. Continue Eliquis. Had pneumonia as well as PE's in early May. Need to repeat CT chest due to a mass (3.4 cm x 1.6 cm) at left posterior sulcus.    05/10/18: Rev 15 mg qod.     05/17/18: Repeat CT ([place]) with improvement and no mass or continued PEs. Mild intermittent pain left posterior chest (not pleuritic). M-spike 1.3, IgG 1510, kappa 64 (stable).    08/08/18: Pain in left posterior chest better.  CT scan shows resolution of PEs. Stable labs on Rev 15 qod and dex 8 mg weekly, both without break.      Interval History: no new sx    Past medical, family and social histories, as well as current medications and allergies, were reviewed and updated in the medical record as appropriate.         Review of Systems - Positive findings in bold  General: reviewed and is non-contributory to the illness/disease  Ophthalmic: reviewed and is non-contributory to the illness/disease  ENT: reviewed and is non-contributory to the illness/disease  Hematological and Lymphatic: reviewed and is non-contributory to the illness/disease  Respiratory: no cough, shortness of breath, or wheezing  Cardiovascular: no chest pain or dyspnea on exertion  Gastrointestinal: no abdominal pain, change in bowel habits, or black or bloody stools  Musculoskeletal:   no bone pain. Otherwise, reviewed and is non-contributory to the illness/disease  Neurological: no TIA or stroke symptoms.  No PN.  Dermatological: reviewed and is non-contributory to the illness/disease  The remainder of the systems were reviewed and are non-contributory to the illness/disease    Physical Exam  KPS 100  ECOG 0   Constitutional: Pleasant. NAD  HENT: Oropharynx is clear and moist. No oral lesions/ulcerations   Eyes: Conjunctivae and EOM are normal. Pupils are equal, round, and reactive to light.   Neck: Neck supple. No  JVD present. No thyromegaly present.  Lymphadenopathy: No cervical, inguinal adenopathy.    Cardiovascular:  Nl S1S2, Regular rhythm and no murmurs.    Pulmonary/Chest:  Normal resp rate, Breath sounds normal.   Abdominal: Soft and  round, No HSM, no mass. There is no tenderness. There is no rebound and no guarding.    Musculoskeletal: Normal range of motion.   Neurological: Alert and oriented to person, place, and time. Motor/sensory nonfocal   MS:    No spine tenderness  Extremities: No clubbing, cyanosis or edema  Skin: Skin is warm and dry. No rash.   Psychiatric:  Normal mood and affect.     Lab Results   Component Value Date    WBC Count 3.8 08/08/2018    Hemoglobin 13.9 08/08/2018    Hematocrit 42.3 08/08/2018    MCV 91 08/08/2018    Platelet Count 206 08/08/2018     Lab Results   Component Value Date    Albumin, Serum / Plasma 3.5 08/08/2018      Lab Results   Component Value Date    Creatinine 0.77 08/08/2018    Creatinine Whole Blood 0.8 06/05/2014    Creatinine, Serum 0.70 03/15/2018      Lab Results   Component Value Date    Sodium, Serum / Plasma 142 08/08/2018    Potassium, Serum / Plasma 3.5 08/08/2018    Chloride, Serum / Plasma 110 (H) 08/08/2018    Carbon Dioxide, Total 23 08/08/2018     No results found for: TPUR  Prot Electrophoresis   Date Value Ref Range Status   08/08/2018 PENDING Normal pattern. Incomplete   05/17/2018 Abnormal (A) Normal pattern. Final     Comment:     Hypoalbuminemia with a protein spike in the gamma region, consistent with a monoclonal gammopathy. Paraprotein spike = 1.3 g/dL.  Interpretation by Pathologist: Clelia Schaumann, M.D.     04/05/2018 Abnormal (A) Normal pattern. 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 Paraprotein spike =  1.4 g/dL.  Interpretation by Pathologist: Clelia Schaumann, M.D.       Results for orders placed or performed in visit on 05/17/18   Kappa and Lambda Free light Chains, Serum   Result  Value Ref Range    Kappa Lt Chain, Free 64.4 (H) 3.3 - 19.4 mg/L    Lambda Lt Chain, Free 4.7 (L) 5.7 - 26.3 mg/L    Kappa/Lambda Ratio 13.70 (H) 0.26 - 1.65   Results for orders placed or performed in visit on 04/05/18   Kappa and Lambda Free light Chains, Serum   Result Value Ref Range    Kappa Lt Chain, Free 85.2 (H) 3.3 - 19.4 mg/L    Lambda Lt Chain, Free 5.4 (L) 5.7 - 26.3 mg/L    Kappa/Lambda Ratio 15.78 (H) 0.26 - 1.65   Results for orders placed or performed in visit on 03/14/18   Kappa and Lambda Free light Chains, Serum   Result Value Ref Range    Kappa Lt Chain, Free 52.4 (H) 3.3 - 19.4 mg/L    Lambda Light Chain, Free 5.4 (L) 5.7 - 26.3 mg/L    Kappa/Lambda Ratio 9.70 (H) 0.26 - 1.65     Lab Results   Component Value Date    IgA, serum 22 (L) 03/16/2018    IgA, serum 44 (L) 01/29/2016    IgA, serum 45 (L) 12/02/2015    IgG, serum 1,540 08/08/2018    IgG, serum 1,510 05/17/2018    IgG, serum 1,680 04/05/2018    IgM, serum 25 (L) 03/16/2018    IgM, serum 33 (L) 01/29/2016    IgM, serum 35 (  L) 12/02/2015               ASSESSMENT and PLAN:    Response to therapy (if on therapy) is: SD       1) Myeloma ([place])    Pain in left posterior chest better.  CT scan shows resolution of PEs. Stable labs on Rev 15 qod and dex 8 mg weekly, both without break.     2)  Pain and other symptoms: none      3)  Labs that need attention: M-spike      4)  New problem: none      5)  Referrals: none      6)  Plan for therapy: Continue same Rev 15 qod/dex 8 mg (4/4).          This patient has illness that poses a threat to life or bodily function and is / has been or will be undergoing drug therapy requiring intensive monitoring for toxicity.   I spent 45 minutes face to face with the patient of which 35 minutes of that time were spent in consultation and discussion and counseling regarding the diagnosis, the treatment plan, medication risks, lifestyle modification and symptoms.

## 2018-08-09 LAB — IGG, SERUM: IgG, serum: 1540 mg/dL (ref 672–1760)

## 2018-08-09 NOTE — Assessment & Plan Note (Signed)
Pain in left posterior chest better.  CT scan shows resolution of PEs. Stable labs on Rev 15 qod and dex 8 mg weekly, both without break.

## 2018-08-10 LAB — PROTEIN ELECTROPHORESIS, SERUM
Albumin, SPEP: 3.4 g/dL (ref 3.4–4.7)
Alpha 1 Globulin: 0.3 g/dL (ref 0.1–0.3)
Alpha 2 Globulin: 0.8 g/dL (ref 0.6–1.0)
Beta Globulin: 1.1 g/dL (ref 0.7–1.4)
Gamma Globulin: 1.6 g/dL (ref 0.6–1.6)
Paraprotein Concentration: 1.4 g/dL — AB
Prot Electrophoresis: ABNORMAL — AB
Total Protein: 7.1 g/dL (ref 6.0–8.1)

## 2018-08-10 LAB — KAPPA AND LAMBDA FREE LIGHT CH
Kappa Lt Chain, Free: 63.2 mg/L — ABNORMAL HIGH (ref 3.3–19.4)
Kappa/Lambda Ratio: 11.49 — ABNORMAL HIGH (ref 0.26–1.65)
Lambda Lt Chain, Free: 5.5 mg/L — ABNORMAL LOW (ref 5.7–26.3)

## 2018-08-10 LAB — IMMUNOFIXATION ELECTROPHORESIS

## 2018-12-12 ENCOUNTER — Ambulatory Visit: Admit: 2018-12-12 | Discharge: 2018-12-12 | Payer: PRIVATE HEALTH INSURANCE

## 2018-12-12 DIAGNOSIS — C9000 Multiple myeloma not having achieved remission: Secondary | ICD-10-CM

## 2018-12-12 DIAGNOSIS — D848 Other specified immunodeficiencies: Secondary | ICD-10-CM

## 2018-12-12 DIAGNOSIS — Z9484 Stem cells transplant status: Secondary | ICD-10-CM

## 2018-12-12 LAB — COMPREHENSIVE METABOLIC PANEL
AST: 17 U/L (ref 17–42)
Alanine transaminase: 21 U/L (ref 11–50)
Albumin, Serum / Plasma: 3.5 g/dL (ref 3.5–4.8)
Alkaline Phosphatase: 82 U/L (ref 31–95)
Anion Gap: 8 (ref 4–14)
Bilirubin, Total: 1.5 mg/dL — ABNORMAL HIGH (ref 0.2–1.3)
Calcium, total, Serum / Plasma: 9.3 mg/dL (ref 8.8–10.3)
Carbon Dioxide, Total: 25 mmol/L (ref 22–32)
Chloride, Serum / Plasma: 106 mmol/L (ref 97–108)
Creatinine: 0.69 mg/dL (ref 0.44–1.00)
Glucose, non-fasting: 88 mg/dL (ref 70–199)
Potassium, Serum / Plasma: 3.2 mmol/L — ABNORMAL LOW (ref 3.5–5.1)
Protein, Total, Serum / Plasma: 7 g/dL (ref 6.0–8.4)
Sodium, Serum / Plasma: 139 mmol/L (ref 135–145)
Urea Nitrogen, Serum / Plasma: 11 mg/dL (ref 6–22)
eGFR - high estimate: 111 mL/min (ref >60)
eGFR - low estimate: 96 mL/min (ref >60)

## 2018-12-12 LAB — COMPLETE BLOOD COUNT WITH DIFF
Abs Basophils: 0.05 10*9/L (ref 0.0–0.1)
Abs Eosinophils: 0.11 10*9/L (ref 0.0–0.4)
Abs Imm Granulocytes: 0.03 10*9/L (ref <0.1)
Abs Lymphocytes: 1.57 10*9/L (ref 1.0–3.4)
Abs Monocytes: 0.4 10*9/L (ref 0.2–0.8)
Abs Neutrophils: 1.32 10*9/L — ABNORMAL LOW (ref 1.8–6.8)
Hematocrit: 41.5 % (ref 36–46)
Hemoglobin: 13.7 g/dL (ref 12.0–15.5)
MCH: 30.3 pg (ref 26–34)
MCHC: 33 g/dL (ref 31–36)
MCV: 92 fL (ref 80–100)
Platelet Count: 193 10*9/L (ref 140–450)
RBC Count: 4.52 10*12/L (ref 4.0–5.2)
WBC Count: 3.5 10*9/L (ref 3.4–10)

## 2018-12-12 MED ORDER — OSELTAMIVIR 75 MG CAPSULE
75 | ORAL_CAPSULE | Freq: Two times a day (BID) | ORAL | 0 refills | Status: AC
Start: 2018-12-12 — End: 2018-12-22

## 2018-12-12 NOTE — Progress Notes (Signed)
[employer]AR[employer]  [id number]    Date of Service:   12/12/18      Ariel Braun is a 58 y.o. female with a history of myeloma, well-controlled on rev 15 qod (3/4) /dex * mg (4/4)  who is here for follow-up and recommendations for management.    For review the patient history is as follows:     IgG kappa myeloma, diagnosed 09/07/10  Significant bone involvement, 8% plasma cells, normal cyto 46XX, FISH showed only 3 copies 1q21, Stage 2 ISS (3.88 beta 2 microglobulin),IgG 4750, M-spike 3.2, kappa 586.9 mg/L.  RVD 10/2010 through April 2011 (? 4 cycles). Achieved VGPR with M-spike 0.4, IgG 886, kappa 27.6.  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  8 months post transplant, her M-spike continues to fall (0.2 on 11/26/11), she's doing great.    She has intermittent pain right hip, with x-ray showing old lesion. Would recommend PET/CT.      01/21/12: M-spike 0.3, kappa 16.5, IgG 810. PET/CT neg.    Swelling of feet and nausea (maybe HAE or zometa).     Then had HAE attacks x 2 with abdominal pain and vomiting.     Observation and no bisphos 2/2 her concerns.    08/11/12: Stable M-protein 0.2, IgG 729, kappa 12.3 in August and IgG 723.     09/29/12: MRI shoulder no myeloma. Kappa 17.     12/08/12: HAE attacks more frequent (weekly) but less severe. To receive C 1 esterase inhibitor (Berinert) prior to receiving pneumovax, TdAP, and hep B (12/11/12).     12/18/12. Slow rise in para-protein to M-spike 0.4    01/26/13: Did well with immunizations with Berinert pre-med. Hasn't used [employer] since.    03/14/13: PET/CT no uptake. Meanwhile, M-spike of 0.4, IgG 937, kappa 18.5.     06/12/13: No HAE. M-spike 0.6, IgG 1270.     08/24/13: M-spike 0.6, IG 1330, kappa 32.3. Continue to observe.     03/29/14: MRI of hip no myeloma. M-spike 1.1.     06/11/14: PET normal.     08/05/14: Started Rev 15 mg 3/4. M-spike 1, IgG 1640, kappa 62.2    09/23/14: Seeing [name]. Less pain in feet since Rev.M-spike  0.8, IgG 1532, kappa 62.    02/17/15: Kappa up a bit to 79.6. Proposed adding Ixazomib to Rev dex if she gets to kappa 100.      06/10/15: Kappa 76.9 Stable on Rev. Although ANC however at about 0.8 to 1.    09/16/15: Kappa 69.9 (down) but ANC 0.8. Will reduce Revlimid to 15 mg qod to allow ANC to rise a bit.    12/09/15: Feels much better on Rev 15 mg qod, but M-spike up to 1.1 and IgG 1660. Will add dex 8 mg weekly and ativan for anxiety and sleep.    04/06/16: Now on dex 4 mg two days in a row, Rev 15 mg q o d.   06/04/16: Kappa 30.1. Cont. QOD Rev 47m. PET scan ordered and scheduled.    08/20/16: Feels fine. Myeloma labs stable with M-spike 1.1, IgG 1500, kappa 59. PET/CT neg from 7/28. Plan to continue Rev 15 mg qod, Dex 8 mg qwk.    12/14/16: Minimal left flank pain which is rare and intermittent. No hematuria. Myeloma labs better if anything with M-spike 0.9 and Kappa 44.2. Continue Rev 15 mg qod and dex 8 mg q week.  03/15/17: Feels well. Pain gone. IgG 1530. Continue Rev 15 mg qod and dex 8 mg weekly.    04/17/17: Will start dex in AM and Rev at night.    06/21/17: Stable M-spike 1.2, IgG 1480, kappa 58.9. Continue Rev 15 mg qod and dex 8 mg q wk.     09/16/17: M-spike stable 1.2. Kappa 52.6. Continue Rev 15 mg qod. Dex 8 mg q wk    09/16/17: Stable. M-spike 0.2, kappa 59. Continue Rev 15 mg qod and dex 8 mg q wk. Note: no HAE episodes in 2 years.    01/10/18: No new symptoms. Stable myeloma. Continue Rev 15 mg qod and dex 8 mg q week.    03/19/18: Multiple PEs. Placed on Eliquis. Hold Rev.    04/11/18: M-spike 1.4, kappa 85. Resume Rev at 15 mg daily (3/4), then qod, Continue dex 8 mg qwk. Continue Eliquis. Had pneumonia as well as PE's in early May. Need to repeat CT chest due to a mass (3.4 cm x 1.6 cm) at left posterior sulcus.    05/10/18: Rev 15 mg qod.     05/17/18: Repeat CT ([place]) with improvement and no mass or continued PEs. Mild intermittent pain left posterior chest (not pleuritic). M-spike 1.3, IgG 1510, kappa 64  (stable).    08/08/18: Pain in left posterior chest better. CT scan shows resolution of PEs. Stable labs on Rev 15 qod and dex 8 mg weekly, both without break.    12/12/18:  No significant pain.  KLC 78.  Continue on Rev 15 mg qod, dex 8 mg (4/4).      Interval History: no new problems    Past medical, family and social histories, as well as current medications and allergies, were reviewed and updated in the medical record as appropriate.         Review of Systems - Positive findings in bold  General: reviewed and is non-contributory to the illness/disease  Ophthalmic: reviewed and is non-contributory to the illness/disease  ENT: reviewed and is non-contributory to the illness/disease  Hematological and Lymphatic: reviewed and is non-contributory to the illness/disease  Respiratory: no cough, shortness of breath, or wheezing  Cardiovascular: no chest pain or dyspnea on exertion  Gastrointestinal: no abdominal pain, change in bowel habits, or black or bloody stools  Musculoskeletal:   no bone pain. Otherwise, reviewed and is non-contributory to the illness/disease  Neurological: no TIA or stroke symptoms.  No PN.  Dermatological: reviewed and is non-contributory to the illness/disease  The remainder of the systems were reviewed and are non-contributory to the illness/disease    Physical Exam  KPS 100  ECOG 0   Constitutional: Pleasant. NAD  HENT: Oropharynx is clear and moist. No oral lesions/ulcerations   Eyes: Conjunctivae and EOM are normal. Pupils are equal, round, and reactive to light.   Neck: Neck supple. No JVD present. No thyromegaly present.  Lymphadenopathy: No cervical, inguinal adenopathy.    Cardiovascular:  Nl S1S2, Regular rhythm and no murmurs.    Pulmonary/Chest:  Normal resp rate, Breath sounds normal.   Abdominal: Soft and round, No HSM, no mass. There is no tenderness. There is no rebound and no guarding.    Musculoskeletal: Normal range of motion.   Neurological: Alert and oriented to person,  place, and time. Motor/sensory nonfocal   MS:    No spine tenderness  Extremities: No clubbing, cyanosis or edema  Skin: Skin is warm and dry. No rash.   Psychiatric:  Normal mood and  affect.     Lab Results   Component Value Date    WBC Count 3.5 12/12/2018    Hemoglobin 13.7 12/12/2018    Hematocrit 41.5 12/12/2018    MCV 92 12/12/2018    Platelet Count 193 12/12/2018     Lab Results   Component Value Date    Albumin, Serum / Plasma 3.5 12/12/2018      Lab Results   Component Value Date    Creatinine 0.69 12/12/2018    Creatinine Whole Blood 0.8 06/05/2014    Creatinine, Serum 0.70 03/15/2018      Lab Results   Component Value Date    Sodium, Serum / Plasma 139 12/12/2018    Potassium, Serum / Plasma 3.2 (L) 12/12/2018    Chloride, Serum / Plasma 106 12/12/2018    Carbon Dioxide, Total 25 12/12/2018     No results found for: TPUR  Prot Electrophoresis   Date Value Ref Range Status   08/08/2018 Abnormal (A) Normal pattern. 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 Paraprotein spike = 1.4 g/dL.  Interpretation by Pathologist: Clelia Schaumann, M.D.     05/17/2018 Abnormal (A) Normal pattern. Final     Comment:     Hypoalbuminemia with a protein spike in the gamma region, consistent with a monoclonal gammopathy. Paraprotein spike = 1.3 g/dL.  Interpretation by Pathologist: Clelia Schaumann, M.D.     04/05/2018 Abnormal (A) Normal pattern. 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 Paraprotein spike =  1.4 g/dL.  Interpretation by Pathologist: Clelia Schaumann, M.D.       Results for orders placed or performed in visit on 12/12/18   Kappa and Lambda Free light Chains, Serum   Result Value Ref Range    Kappa Lt Chain, Free 78.0 (H) 3.3 - 19.4 mg/L    Lambda Lt Chain, Free 5.5 (L) 5.7 - 26.3 mg/L    Kappa/Lambda Ratio 14.18 (H) 0.26 - 1.65   Results for orders placed or performed in visit on 08/08/18    Kappa and Lambda Free light Chains, Serum   Result Value Ref Range    Kappa Lt Chain, Free 63.2 (H) 3.3 - 19.4 mg/L    Lambda Lt Chain, Free 5.5 (L) 5.7 - 26.3 mg/L    Kappa/Lambda Ratio 11.49 (H) 0.26 - 1.65   Results for orders placed or performed in visit on 05/17/18   Kappa and Lambda Free light Chains, Serum   Result Value Ref Range    Kappa Lt Chain, Free 64.4 (H) 3.3 - 19.4 mg/L    Lambda Lt Chain, Free 4.7 (L) 5.7 - 26.3 mg/L    Kappa/Lambda Ratio 13.70 (H) 0.26 - 1.65     Lab Results   Component Value Date    IgA, serum 22 (L) 03/16/2018    IgA, serum 44 (L) 01/29/2016    IgA, serum 45 (L) 12/02/2015    IgG, serum 1,630 12/12/2018    IgG, serum 1,540 08/08/2018    IgG, serum 1,510 05/17/2018    IgM, serum 25 (L) 03/16/2018    IgM, serum 33 (L) 01/29/2016    IgM, serum 35 (L) 12/02/2015               ASSESSMENT and PLAN:    Response to therapy (if on therapy) is: SD  1) Myeloma ([place])    No significant pain.      KLC 78.      Continue on Rev 15 mg qod, dex 8 mg (4/4).     2)  Pain and other symptoms: none      3)  Labs that need attention: [place]      4)  New problem: none      5)  Referrals: none      6)  Plan for therapy: Continue Rev/dex          This patient has illness that poses a threat to life or bodily function and is / has been or will be undergoing drug therapy requiring intensive monitoring for toxicity.   I spent 45 minutes face to face with the patient of which 35 minutes of that time were spent in consultation and discussion and counseling regarding the diagnosis, the treatment plan, medication risks, lifestyle modification and symptoms.

## 2018-12-13 LAB — KAPPA AND LAMBDA FREE LIGHT CH
Kappa Lt Chain, Free: 78 mg/L — ABNORMAL HIGH (ref 3.3–19.4)
Kappa/Lambda Ratio: 14.18 — ABNORMAL HIGH (ref 0.26–1.65)
Lambda Lt Chain, Free: 5.5 mg/L — ABNORMAL LOW (ref 5.7–26.3)

## 2018-12-13 LAB — IGG, SERUM: IgG, serum: 1630 mg/dL (ref 672–1760)

## 2018-12-14 LAB — IMMUNOFIXATION ELECTROPHORESIS

## 2018-12-15 NOTE — Assessment & Plan Note (Signed)
No significant pain.      KLC 78.      Continue on Rev 15 mg qod, dex 8 mg (4/4).

## 2019-03-20 MED ORDER — DEXAMETHASONE 4 MG TABLET
4 | ORAL | 3 refills | 6.00000 days | Status: DC
Start: 2019-03-20 — End: 2020-04-16

## 2019-03-20 MED ORDER — ACYCLOVIR 400 MG TABLET
400 | ORAL_TABLET | ORAL | 1 refills | Status: DC
Start: 2019-03-20 — End: 2019-09-11

## 2019-03-20 MED ORDER — DEXAMETHASONE 4 MG TABLET
4 | ORAL_TABLET | ORAL | 3 refills | 6.00000 days | Status: AC
Start: 2019-03-20 — End: 2020-04-16

## 2019-04-03 ENCOUNTER — Ambulatory Visit: Admit: 2019-04-03 | Discharge: 2019-04-03 | Payer: PRIVATE HEALTH INSURANCE

## 2019-04-03 DIAGNOSIS — C9000 Multiple myeloma not having achieved remission: Secondary | ICD-10-CM

## 2019-04-03 LAB — COMPREHENSIVE METABOLIC PANEL
AST: 17 U/L (ref 5–44)
Alanine transaminase: 22 U/L (ref 10–61)
Albumin, Serum / Plasma: 3.9 g/dL (ref 3.5–5.0)
Alkaline Phosphatase: 89 U/L (ref 38–108)
Anion Gap: 8 (ref 4–14)
Bilirubin, Total: 1.8 mg/dL — ABNORMAL HIGH (ref 0.2–1.2)
Calcium, total, Serum / Plasma: 9.1 mg/dL (ref 8.4–10.5)
Carbon Dioxide, Total: 24 mmol/L (ref 22–29)
Chloride, Serum / Plasma: 108 mmol/L (ref 101–110)
Creatinine: 0.66 mg/dL (ref 0.55–1.02)
Glucose, non-fasting: 88 mg/dL (ref 70–199)
Potassium, Serum / Plasma: 3.5 mmol/L (ref 3.5–5.0)
Protein, Total, Serum / Plasma: 7.7 g/dL (ref 6.3–8.6)
Sodium, Serum / Plasma: 140 mmol/L (ref 135–145)
Urea Nitrogen, Serum / Plasma: 9 mg/dL (ref 7–25)
eGFR - high estimate: 112 mL/min (ref >60)
eGFR - low estimate: 97 mL/min (ref >60)

## 2019-04-03 LAB — COMPLETE BLOOD COUNT WITH DIFF
Abs Basophils: 0.05 10*9/L (ref 0.0–0.1)
Abs Eosinophils: 0.12 10*9/L (ref 0.0–0.4)
Abs Imm Granulocytes: 0.05 10*9/L (ref <0.1)
Abs Lymphocytes: 1.46 10*9/L (ref 1.0–3.4)
Abs Monocytes: 0.41 10*9/L (ref 0.2–0.8)
Abs Neutrophils: 1.54 10*9/L — ABNORMAL LOW (ref 1.8–6.8)
Hematocrit: 43.8 % (ref 36–46)
Hemoglobin: 14.3 g/dL (ref 12.0–15.5)
MCH: 30.6 pg (ref 26–34)
MCHC: 32.6 g/dL (ref 31–36)
MCV: 94 fL (ref 80–100)
Platelet Count: 146 10*9/L (ref 140–450)
RBC Count: 4.68 10*12/L (ref 4.0–5.2)
WBC Count: 3.6 10*9/L (ref 3.4–10)

## 2019-04-03 LAB — IGA, SERUM: IgA, serum: 25 mg/dL — ABNORMAL LOW (ref 89–581)

## 2019-04-03 LAB — IGM, SERUM: IgM, serum: 18 mg/dL — ABNORMAL LOW (ref 39–333)

## 2019-04-03 LAB — IGG, SERUM: IgG, serum: 1770 mg/dL — ABNORMAL HIGH (ref 672–1760)

## 2019-04-03 LAB — LACTATE DEHYDROGENASE, BLOOD: Lactate Dehydrogenase, Serum /: 164 U/L (ref 125–243)

## 2019-04-04 LAB — KAPPA AND LAMBDA FREE LIGHT CH
Kappa Lt Chain, Free: 96.3 mg/L — ABNORMAL HIGH (ref 3.3–19.4)
Kappa/Lambda Ratio: 24.69 — ABNORMAL HIGH (ref 0.26–1.65)
Lambda Lt Chain, Free: 3.9 mg/L — ABNORMAL LOW (ref 5.7–26.3)

## 2019-04-05 LAB — PROTEIN ELECTROPHORESIS, SERUM
Albumin, SPEP: 3.8 g/dL (ref 3.4–4.7)
Alpha 1 Globulin: 0.3 g/dL (ref 0.1–0.3)
Alpha 2 Globulin: 0.6 g/dL (ref 0.6–1.0)
Beta Globulin: 1.1 g/dL (ref 0.7–1.4)
Gamma Globulin: 1.6 g/dL (ref 0.6–1.6)
Paraprotein Concentration: 1.4 g/dL — AB
Prot Electrophoresis: ABNORMAL — AB
Total Protein: 7.3 g/dL (ref 6.3–8.6)

## 2019-04-17 ENCOUNTER — Ambulatory Visit: Admit: 2019-04-17 | Discharge: 2019-04-17 | Payer: PRIVATE HEALTH INSURANCE

## 2019-04-17 DIAGNOSIS — D848 Other specified immunodeficiencies: Secondary | ICD-10-CM

## 2019-04-17 DIAGNOSIS — Z9484 Stem cells transplant status: Secondary | ICD-10-CM

## 2019-04-17 DIAGNOSIS — C9000 Multiple myeloma not having achieved remission: Secondary | ICD-10-CM

## 2019-04-17 DIAGNOSIS — C9002 Multiple myeloma in relapse: Secondary | ICD-10-CM

## 2019-04-17 NOTE — Progress Notes (Signed)
I performed this consultation using real-time Telehealth tools, including a live video connection between my location and the patient's location which was in [place].     Prior to initiating the consultation, I obtained informed verbal consent to perform this consultation using Telehealth tools and answered all the questions about the Telehealth interaction.    Ariel Braun  ZO:[id number]    Date of Service:   04/17/19      Ariel Braun is a 58 y.o. female with a history of myeloma, who has been on Rev/dex for 5 yrs, who is being seen through a Telemed visit for follow-up and recommendations for management.    For review the patient history is as follows:     IgG kappa myeloma, diagnosed 09/07/10    Significant bone involvement, 8% plasma cells, normal cyto 46XX, FISH showed only 3 copies 1q21, Stage 2 ISS (3.88 beta 2 microglobulin),IgG 4750, M-spike 3.2, kappa 586.9 mg/L.    RVD 10/2010 through April 2011 (? 4 cycles). Achieved VGPR with M-spike 0.4, IgG 886, kappa 27.6.    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    8 months post transplant, her M-spike continues to fall (0.2 on 11/26/11), she's doing great.    She has intermittent pain right hip, with x-ray showing old lesion. Would recommend PET/CT.     01/21/12: M-spike 0.3, kappa 16.5, IgG 810. PET/CT neg.    Swelling of feet and nausea (maybe HAE or zometa).     Then had HAE attacks x 2 with abdominal pain and vomiting.     Observation and no bisphos 2/2 her concerns.    08/11/12: Stable M-protein 0.2, IgG 729, kappa 12.3 in August and IgG 723.     09/29/12: MRI shoulder no myeloma. Kappa 17.     12/08/12: HAE attacks more frequent (weekly) but less severe. To receive C 1 esterase inhibitor (Berinert) prior to receiving pneumovax, TdAP, and hep B (12/11/12).     12/18/12. Slow rise in para-protein to M-spike 0.4    01/26/13: Did well with immunizations with Berinert pre-med. Hasn't used [employer] since.    03/14/13:  PET/CT no uptake. Meanwhile, M-spike of 0.4, IgG 937, kappa 18.5.    06/12/13: No HAE. M-spike 0.6, IgG 1270.     08/24/13: M-spike 0.6, IG 1330, kappa 32.3. Continue to observe.     03/29/14: MRI of hip no myeloma. M-spike 1.1.     06/11/14: PET normal.     08/05/14: Started Rev 15 mg 3/4. M-spike 1, IgG 1640, kappa 62.2    09/23/14: Seeing [name]. Less pain in feet since Rev.M-spike 0.8, IgG 1532, kappa 62.    02/17/15: Kappa up a bit to 79.6. Proposed adding Ixazomib to Rev dex if she gets to kappa 100.     06/10/15: Kappa 76.9 Stable on Rev. Although ANC however at about 0.8 to 1.    09/16/15: Kappa 69.9 (down) but ANC 0.8. Will reduce Revlimid to 15 mg qod to allow ANC to rise a bit.    12/09/15: Feels much better on Rev 15 mg qod, but M-spike up to 1.1 and IgG 1660. Will add dex 8 mg weekly and ativan for anxiety and sleep.    04/06/16: Now on dex 4 mg two days in a row, Rev 15 mg q o d.   06/04/16: Kappa 30.1. Cont. QOD Rev '15mg'$ . PET scan ordered and scheduled.    08/20/16: Feels  fine. Myeloma labs stable with M-spike 1.1, IgG 1500, kappa 59. PET/CT neg from 7/28. Plan to continue Rev 15 mg qod, Dex 8 mg qwk.    12/14/16: Minimal left flank pain which is rare and intermittent. No hematuria. Myeloma labs better if anything with M-spike 0.9 and Kappa 44.2. Continue Rev 15 mg qod and dex 8 mg q week.    03/15/17: Feels well. Pain gone. IgG 1530. Continue Rev 15 mg qod and dex 8 mg weekly.    04/17/17: Will start dex in AM and Rev at night.    06/21/17: Stable M-spike 1.2, IgG 1480, kappa 58.9. Continue Rev 15 mg qod and dex 8 mg q wk.     09/16/17: M-spike stable 1.2. Kappa 52.6. Continue Rev 15 mg qod. Dex 8 mg q wk    09/16/17: Stable. M-spike 0.2, kappa 59. Continue Rev 15 mg qod and dex 8 mg q wk. Note: no HAE episodes in 2 years.    01/10/18: No new symptoms. Stable myeloma. Continue Rev 15 mg qod and dex 8 mg q week.    03/19/18: Multiple PEs. Placed on Eliquis. Hold Rev.    04/11/18: M-spike 1.4, kappa 85. Resume Rev at 15  mg daily (3/4), then qod, Continue dex 8 mg qwk. Continue Eliquis. Had pneumonia as well as PE's in early May. Need to repeat CT chest due to a mass (3.4 cm x 1.6 cm) at left posterior sulcus.    05/10/18: Rev 15 mg qod.     05/17/18: Repeat CT ([place]) with improvement and no mass or continued PEs. Mild intermittent pain left posterior chest (not pleuritic). M-spike 1.3, IgG 1510, kappa 64 (stable).    08/08/18: Pain in left posterior chest better. CT scan shows resolution of PEs. Stable labs on Rev 15 qod and dex 8 mg weekly, both without break.    12/12/18: [place] 78. Continue on Rev 15 mg qod, dex 8 mg (4/4).    04/17/19: M-spike 1.4, KLC 96.3.  Continued progression.  Recommend upping Revlimid to 15 mg daily (3/4) and continue dex 8 mg (4/4).  If this fails, move to Pom/Cy/dex, Dara-based, or Carfilzomib based.      Interval History: No new sx    Past medical, family and social histories, as well as current medications and allergies, were reviewed and updated in the medical record as appropriate.         Review of Systems - Positive findings in bold  General: reviewed and is non-contributory to the illness/disease  Ophthalmic: reviewed and is non-contributory to the illness/disease  ENT: reviewed and is non-contributory to the illness/disease  Hematological and Lymphatic: reviewed and is non-contributory to the illness/disease  Respiratory: no cough, shortness of breath, or wheezing  Cardiovascular: no chest pain or dyspnea on exertion  Gastrointestinal: no abdominal pain, change in bowel habits, or black or bloody stools  Musculoskeletal:   no bone pain. Otherwise, reviewed and is non-contributory to the illness/disease  Neurological: no TIA or stroke symptoms.  No PN.  Dermatological: reviewed and is non-contributory to the illness/disease  The remainder of the systems were reviewed and are non-contributory to the illness/disease      Lab Results   Component Value Date    WBC Count 3.6 04/03/2019    Hemoglobin 14.3  04/03/2019    Hematocrit 43.8 04/03/2019    MCV 94 04/03/2019    Platelet Count 146 04/03/2019     Lab Results   Component Value Date    Albumin,  Serum / Plasma 3.9 04/03/2019      Lab Results   Component Value Date    Creatinine 0.66 04/03/2019    Creatinine Whole Blood 0.8 06/05/2014      Lab Results   Component Value Date    Sodium, Serum / Plasma 140 04/03/2019    Potassium, Serum / Plasma 3.5 04/03/2019    Chloride, Serum / Plasma 108 04/03/2019    Carbon Dioxide, Total 24 04/03/2019     No results found for: TPUR  Prot Electrophoresis   Date Value Ref Range Status   04/03/2019 Abnormal (A) Normal pattern. 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 Paraprotein spike =  1.4 g/dL.  Interpretation by Pathologist: Clelia Schaumann, M.D.     08/08/2018 Abnormal (A) Normal pattern. 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 Paraprotein spike = 1.4 g/dL.  Interpretation by Pathologist: Clelia Schaumann, M.D.     05/17/2018 Abnormal (A) Normal pattern. Final     Comment:     Hypoalbuminemia with a protein spike in the gamma region, consistent with a monoclonal gammopathy. Paraprotein spike = 1.3 g/dL.  Interpretation by Pathologist: Clelia Schaumann, M.D.       Results for orders placed or performed in visit on 04/03/19   Kappa and Lambda Free light Chains, Serum   Result Value Ref Range    Kappa Lt Chain, Free 96.3 (H) 3.3 - 19.4 mg/L    Lambda Lt Chain, Free 3.9 (L) 5.7 - 26.3 mg/L    Kappa/Lambda Ratio 24.69 (H) 0.26 - 1.65   Results for orders placed or performed in visit on 12/12/18   Kappa and Lambda Free light Chains, Serum   Result Value Ref Range    Kappa Lt Chain, Free 78.0 (H) 3.3 - 19.4 mg/L    Lambda Lt Chain, Free 5.5 (L) 5.7 - 26.3 mg/L    Kappa/Lambda Ratio 14.18 (H) 0.26 - 1.65   Results for orders placed or performed in visit on 08/08/18   Kappa and Lambda Free light Chains, Serum    Result Value Ref Range    Kappa Lt Chain, Free 63.2 (H) 3.3 - 19.4 mg/L    Lambda Lt Chain, Free 5.5 (L) 5.7 - 26.3 mg/L    Kappa/Lambda Ratio 11.49 (H) 0.26 - 1.65     Lab Results   Component Value Date    IgA, serum 25 (L) 04/03/2019    IgA, serum 22 (L) 03/16/2018    IgA, serum 44 (L) 01/29/2016    IgG, serum 1,770 (H) 04/03/2019    IgG, serum 1,630 12/12/2018    IgG, serum 1,540 08/08/2018    IgM, serum 18 (L) 04/03/2019    IgM, serum 25 (L) 03/16/2018    IgM, serum 33 (L) 01/29/2016               ASSESSMENT and PLAN:    Response to therapy (if on therapy) is: slow progression       1) Myeloma ([facility])    M-spike 1.4, KLC 96.3.      Continued progression.      Recommend upping Revlimid to 15 mg daily (3/4) and continue dex 8 mg (4/4).      If this fails, move to Pom/Cy/dex, Dara-based, or Carfilzomib based therapy.     2)  Pain and other symptoms:  none      3)  Labs that need attention: M-spike, KLC.      4)  New problem: slow progression      5)  Referrals: none      6)  Plan for therapy:     For now, have gotten her to agree to increase her Revlimid to 15 mg (3/4) but likely will need to switch.  An all oral regimen could be Pom /Cy/dex or if we want to get more aggressive, Dara-based or Carfilzomib-based.          This patient has illness that poses a threat to life or bodily function and is / has been or will be undergoing drug therapy requiring intensive monitoring for toxicity.   I spent 40 minutes face to face with the patient of which 35 minutes of that time were spent in consultation and discussion and counseling regarding the diagnosis, the treatment plan, medication risks, lifestyle modification and symptoms.

## 2019-04-19 NOTE — Assessment & Plan Note (Signed)
M-spike 1.4, KLC 96.3.      Continued progression.      Recommend upping Revlimid to 15 mg daily (3/4) and continue dex 8 mg (4/4).      If this fails, move to Pom/Cy/dex, Dara-based, or Carfilzomib based therapy.

## 2019-05-10 ENCOUNTER — Ambulatory Visit: Admit: 2019-05-10 | Discharge: 2019-05-10 | Payer: PRIVATE HEALTH INSURANCE

## 2019-05-10 DIAGNOSIS — C9000 Multiple myeloma not having achieved remission: Secondary | ICD-10-CM

## 2019-05-10 LAB — COMPREHENSIVE METABOLIC PANEL
AST: 15 U/L (ref 8–43)
Alanine transaminase: 25 U/L (ref 0–33)
Albumin, Serum / Plasma: 4.2 g/dL (ref 3.5–4.8)
Alkaline Phosphatase: 78 U/L (ref 51–155)
Bilirubin, Direct: 0.29 mg/dL (ref 0.00–0.30)
Bilirubin, Indirect: 1.38 mg/dL — ABNORMAL HIGH (ref 0.00–0.90)
Bilirubin, Total: 1.67 mg/dL — ABNORMAL HIGH (ref 0.00–1.20)
Calcium, total, Serum / Plasma: 9.4 mg/dL (ref 8.5–11.0)
Carbon Dioxide, Total: 24 mEq/L (ref 21–32)
Chloride, Serum / Plasma: 107 mEq/L (ref 98–108)
Creatinine, Serum / Plasma: 0.69 mg/dL (ref 0.20–1.10)
Glucose: 101 mg/dL — ABNORMAL HIGH (ref 70–99)
Potassium, Serum / Plasma: 3.2 mEq/L — ABNORMAL LOW (ref 3.5–5.3)
Protein, Total, Serum / Plasma: 6.8 g/dL (ref 6.0–8.0)
Sodium, Serum / Plasma: 144 mEq/L (ref 135–145)
Urea Nitrogen, Serum / Plasma: 13.7 mg/dL (ref 7.0–22.0)

## 2019-05-10 LAB — COMPLETE BLOOD COUNT WITH DIFF
% Basophils: 0.5 % (ref 0.0–5.0)
% Eosinophils: 0.3 % (ref 0.0–10.0)
% Immature Granulocytes: 0.8 % (ref 0.0–10.0)
% Lymphocytes: 29.3 % (ref 24.0–44.0)
% Monocytes: 14.8 % — ABNORMAL HIGH (ref 1.0–10.0)
% NRBC: 0 % (ref 0.0–0.0)
% Seg Neutrophils: 54.3 % (ref 36.0–66.0)
Abs Basophils: 0.02 10*3/uL (ref 0.00–0.40)
Abs Eosinophils: 0.01 10*3/uL (ref 0.00–0.80)
Abs Imm Granulocytes: 0.03 10*3/uL (ref 0.00–0.80)
Abs Lymphocytes: 1.15 10*3/uL — ABNORMAL LOW (ref 1.30–5.40)
Abs Monocytes: 0.58 10*3/uL (ref 0.10–1.00)
Abs NRBC: 0 10*3/uL (ref 0.00–0.00)
Hematocrit: 37 % (ref 36–44)
Hemoglobin: 12.3 gm/dL (ref 12.0–15.5)
MCH: 30.2 pg (ref 27.0–34.0)
MCHC: 32.9 % (ref 32.0–36.0)
MCV: 92 fL (ref 81–100)
MPV: 13.2 fL — ABNORMAL HIGH (ref 7.4–10.4)
Platelet Count: 164 10*3/uL (ref 150–400)
RBC Count: 4.07 10*6/uL (ref 3.90–5.10)
RDW: 14.3 CV% (ref 11.5–14.5)
Segs Absolute: 2.13 10*3/uL (ref 1.80–6.50)
WBC Count: 3.9 10*3/uL — ABNORMAL LOW (ref 5.0–10.0)

## 2019-05-10 LAB — KAPPA (SERUM, FREE) - EXTERNAL: Kappa (Serum, Free) - External: 68.68

## 2019-05-10 LAB — LACTATE DEHYDROGENASE, BLOOD: Lactate Dehydrogenase, Serum /: 150 U/L (ref 135–214)

## 2019-05-10 LAB — IGG, SERUM: IgG, serum: 1410 mg/dL (ref 700–1600)

## 2019-05-10 LAB — LAMBDA (SERUM, FREE) - EXTERNA: Lambda (Serum, Free) - Externa: 3.38

## 2019-05-10 LAB — EXTERNAL RESULT REPORT SCANNED

## 2019-05-15 ENCOUNTER — Ambulatory Visit: Admit: 2019-05-15 | Discharge: 2019-05-15 | Payer: PRIVATE HEALTH INSURANCE

## 2019-05-15 DIAGNOSIS — Z9484 Stem cells transplant status: Secondary | ICD-10-CM

## 2019-05-15 DIAGNOSIS — C9000 Multiple myeloma not having achieved remission: Secondary | ICD-10-CM

## 2019-05-15 DIAGNOSIS — D848 Other specified immunodeficiencies: Secondary | ICD-10-CM

## 2019-05-15 NOTE — Assessment & Plan Note (Signed)
[place] 70 (down from 96) with upping Rev.      Cont Rev 15 mg (3/4) and dex 8 mg (4/4).

## 2019-05-15 NOTE — Progress Notes (Signed)
I performed this consultation using real-time Telehealth tools, including a live video connection between my location and the patient's location which was in [place].     Prior to initiating the consultation, I obtained informed verbal consent to perform this consultation using Telehealth tools and answered all the questions about the Telehealth interaction.    Ariel Braun  [id number]    Date of Service:   05/15/19      Ariel Braun is a 58 y.o. female with a history of myeloma, now on Rev/dex and improving on higher dose, who is being seen through a Telemed visit for follow-up and recommendations for management.    For review the patient history is as follows:     IgG kappa myeloma, diagnosed 09/07/10    Significant bone involvement, 8% plasma cells, normal cyto 46XX, FISH showed only 3 copies 1q21, Stage 2 ISS (3.88 beta 2 microglobulin),IgG 4750, M-spike 3.2, kappa 586.9 mg/L.    RVD 10/2010 through April 2011 (? 4 cycles). Achieved VGPR with M-spike 0.4, IgG 886, kappa 27.6.    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    8 months post transplant, her M-spike continues to fall (0.2 on 11/26/11), she's doing great.    She has intermittent pain right hip, with x-ray showing old lesion. Would recommend PET/CT.     01/21/12: M-spike 0.3, kappa 16.5, IgG 810. PET/CT neg.    Swelling of feet and nausea (maybe HAE or zometa).     Then had HAE attacks x 2 with abdominal pain and vomiting.     Observation and no bisphos 2/2 her concerns.    08/11/12: Stable M-protein 0.2, IgG 729, kappa 12.3 in August and IgG 723.     09/29/12: MRI shoulder no myeloma. Kappa 17.     12/08/12: HAE attacks more frequent (weekly) but less severe. To receive C 1 esterase inhibitor (Berinert) prior to receiving pneumovax, TdAP, and hep B (12/11/12).     12/18/12. Slow rise in para-protein to M-spike 0.4    01/26/13: Did well with immunizations with Berinert pre-med. Hasn't used [employer]  since.    03/14/13: PET/CT no uptake. Meanwhile, M-spike of 0.4, IgG 937, kappa 18.5.    06/12/13: No HAE. M-spike 0.6, IgG 1270.     08/24/13: M-spike 0.6, IG 1330, kappa 32.3. Continue to observe.     03/29/14: MRI of hip no myeloma. M-spike 1.1.     06/11/14: PET normal.     08/05/14: Started Rev 15 mg 3/4. M-spike 1, IgG 1640, kappa 62.2    09/23/14: Seeing [name]. Less pain in feet since Rev.M-spike 0.8, IgG 1532, kappa 62.    02/17/15: Kappa up a bit to 79.6. Proposed adding Ixazomib to Rev dex if she gets to kappa 100.     06/10/15: Kappa 76.9 Stable on Rev. Although ANC however at about 0.8 to 1.    09/16/15: Kappa 69.9 (down) but ANC 0.8. Will reduce Revlimid to 15 mg qod to allow ANC to rise a bit.    12/09/15: Feels much better on Rev 15 mg qod, but M-spike up to 1.1 and IgG 1660. Will add dex 8 mg weekly and ativan for anxiety and sleep.    04/06/16: Now on dex 4 mg two days in a row, Rev 15 mg q o d.   06/04/16: Kappa 30.1. Cont. QOD Rev 23m. PET scan ordered and scheduled.    08/20/16: Feels  fine. Myeloma labs stable with M-spike 1.1, IgG 1500, kappa 59. PET/CT neg from 7/28. Plan to continue Rev 15 mg qod, Dex 8 mg qwk.    12/14/16: Minimal left flank pain which is rare and intermittent. No hematuria. Myeloma labs better if anything with M-spike 0.9 and Kappa 44.2. Continue Rev 15 mg qod and dex 8 mg q week.    03/15/17: Feels well. Pain gone. IgG 1530. Continue Rev 15 mg qod and dex 8 mg weekly.    04/17/17: Will start dex in AM and Rev at night.    06/21/17: Stable M-spike 1.2, IgG 1480, kappa 58.9. Continue Rev 15 mg qod and dex 8 mg q wk.     09/16/17: M-spike stable 1.2. Kappa 52.6. Continue Rev 15 mg qod. Dex 8 mg q wk    09/16/17: Stable. M-spike 0.2, kappa 59. Continue Rev 15 mg qod and dex 8 mg q wk. Note: no HAE episodes in 2 years.    01/10/18: No new symptoms. Stable myeloma. Continue Rev 15 mg qod and dex 8 mg q week.    03/19/18: Multiple PEs. Placed on Eliquis. Hold Rev.    04/11/18: M-spike 1.4, kappa 85.  Resume Rev at 15 mg daily (3/4), then qod, Continue dex 8 mg qwk. Continue Eliquis. Had pneumonia as well as PE's in early May. Need to repeat CT chest due to a mass (3.4 cm x 1.6 cm) at left posterior sulcus.    05/10/18: Rev 15 mg qod.     05/17/18: Repeat CT ([place]) with improvement and no mass or continued PEs. Mild intermittent pain left posterior chest (not pleuritic). M-spike 1.3, IgG 1510, kappa 64 (stable).    08/08/18: Pain in left posterior chest better. CT scan shows resolution of PEs. Stable labs on Rev 15 qod and dex 8 mg weekly, both without break.    12/12/18: Ariel Braun 77. Continue on Rev 15 mg qod, dex 8 mg (4/4).    04/17/19: M-spike 1.4, KLC 96.3. Progression. Up Revlimid to 15 mg qd (3/4) and dex 8 mg wkly(4/4). Options: Pom/Cy/dex, Dara-based, or Carfilzomib based.    05/15/19:  Ariel Braun 69 (down from 96) with upping Rev.  Cont Rev 15 mg (3/4) and dex 8 mg (4/4).      Interval History: no new sx    Past medical, family and social histories, as well as current medications and allergies, were reviewed and updated in the medical record as appropriate.         Review of Systems - Positive findings in bold  General: reviewed and is non-contributory to the illness/disease  Ophthalmic: reviewed and is non-contributory to the illness/disease  ENT: reviewed and is non-contributory to the illness/disease  Hematological and Lymphatic: reviewed and is non-contributory to the illness/disease  Respiratory: no cough, shortness of breath, or wheezing  Cardiovascular: no chest pain or dyspnea on exertion  Gastrointestinal: no abdominal pain, change in bowel habits, or black or bloody stools  Musculoskeletal:   No bone pain. Otherwise, reviewed and is non-contributory to the illness/disease  Neurological: no TIA or stroke symptoms.  No PN.  Dermatological: reviewed and is non-contributory to the illness/disease  The remainder of the systems were reviewed and are non-contributory to the illness/disease      Lab Results   Component  Value Date    WBC Count 3.9 (L) 05/10/2019    Hemoglobin 12.3 05/10/2019    Hematocrit 37 05/10/2019    MCV 92 05/10/2019    Platelet Count 164 05/10/2019  Lab Results   Component Value Date    Albumin, Serum / Plasma 4.2 05/10/2019      Lab Results   Component Value Date    Creatinine 0.66 04/03/2019    Creatinine, Serum / Plasma 0.69 05/10/2019    Creatinine Whole Blood 0.8 06/05/2014      Lab Results   Component Value Date    Sodium, Serum / Plasma 144 05/10/2019    Potassium, Serum / Plasma 3.2 (L) 05/10/2019    Chloride, Serum / Plasma 107 05/10/2019    Carbon Dioxide, Total 24 05/10/2019     No results found for: TPUR  Prot Electrophoresis   Date Value Ref Range Status   04/03/2019 Abnormal (A) Normal pattern. 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 Paraprotein spike =  1.4 g/dL.  Interpretation by Pathologist: Clelia Schaumann, M.D.     08/08/2018 Abnormal (A) Normal pattern. 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 Paraprotein spike = 1.4 g/dL.  Interpretation by Pathologist: Clelia Schaumann, M.D.     05/17/2018 Abnormal (A) Normal pattern. Final     Comment:     Hypoalbuminemia with a protein spike in the gamma region, consistent with a monoclonal gammopathy. Paraprotein spike = 1.3 g/dL.  Interpretation by Pathologist: Clelia Schaumann, M.D.       Results for orders placed or performed in visit on 04/03/19   Kappa and Lambda Free light Chains, Serum   Result Value Ref Range    Kappa Lt Chain, Free 96.3 (H) 3.3 - 19.4 mg/L    Lambda Lt Chain, Free 3.9 (L) 5.7 - 26.3 mg/L    Kappa/Lambda Ratio 24.69 (H) 0.26 - 1.65   Results for orders placed or performed in visit on 12/12/18   Kappa and Lambda Free light Chains, Serum   Result Value Ref Range    Kappa Lt Chain, Free 78.0 (H) 3.3 - 19.4 mg/L    Lambda Lt Chain, Free 5.5 (L) 5.7 - 26.3 mg/L    Kappa/Lambda Ratio 14.18 (H) 0.26 - 1.65    Results for orders placed or performed in visit on 08/08/18   Kappa and Lambda Free light Chains, Serum   Result Value Ref Range    Kappa Lt Chain, Free 63.2 (H) 3.3 - 19.4 mg/L    Lambda Lt Chain, Free 5.5 (L) 5.7 - 26.3 mg/L    Kappa/Lambda Ratio 11.49 (H) 0.26 - 1.65     Lab Results   Component Value Date    IgA, serum 25 (L) 04/03/2019    IgA, serum 22 (L) 03/16/2018    IgA, serum 44 (L) 01/29/2016    IgG, serum 1,410 05/10/2019    IgG, serum 1,770 (H) 04/03/2019    IgG, serum 1,630 12/12/2018    IgM, serum 18 (L) 04/03/2019    IgM, serum 25 (L) 03/16/2018    IgM, serum 33 (L) 01/29/2016               ASSESSMENT and PLAN:    Response to therapy (if on therapy) is: SD       1) Myeloma ([place])    [place] 70 (down from 96) with upping Rev.      Cont Rev 15 mg (3/4) and dex 8 mg (4/4).     2)  Pain and other symptoms: nothing new  3)  Labs that need attention: KLC, M-spike      4)  New problem: none      5)  Referrals: none      6)  Plan for therapy:     Continue Rev 15 mg (3/4) and dex 8 mg (4/4).          This patient has illness that poses a threat to life or bodily function and is / has been or will be undergoing drug therapy requiring intensive monitoring for toxicity.   I spent 20 minutes face to face with the patient of which 15 minutes of that time were spent in consultation and discussion and counseling regarding the diagnosis, the treatment plan, medication risks, lifestyle modification and symptoms.

## 2019-06-08 ENCOUNTER — Encounter: Admit: 2019-06-08 | Discharge: 2019-06-09 | Payer: PRIVATE HEALTH INSURANCE

## 2019-06-08 DIAGNOSIS — C9000 Multiple myeloma not having achieved remission: Secondary | ICD-10-CM

## 2019-06-08 LAB — COMPLETE BLOOD COUNT WITH DIFF
Abs Basophils: 0.02 10*9/L (ref 0.0–0.1)
Abs Eosinophils: 0.1 10*9/L (ref 0.0–0.4)
Abs Imm Granulocytes: 0.02 10*9/L (ref <0.1)
Abs Lymphocytes: 1.54 10*9/L (ref 1.0–3.4)
Abs Monocytes: 0.41 10*9/L (ref 0.2–0.8)
Abs Neutrophils: 0.99 10*9/L — CL (ref 1.8–6.8)
Hematocrit: 36.9 % (ref 36–46)
Hemoglobin: 12.2 g/dL (ref 12.0–15.5)
MCH: 31 pg (ref 26–34)
MCHC: 33.1 g/dL (ref 31–36)
MCV: 94 fL (ref 80–100)
Platelet Count: 156 10*9/L (ref 140–450)
RBC Count: 3.94 10*12/L — ABNORMAL LOW (ref 4.0–5.2)
WBC Count: 3.1 10*9/L — ABNORMAL LOW (ref 3.4–10)

## 2019-06-08 LAB — COMPREHENSIVE METABOLIC PANEL
AST: 14 U/L (ref 5–44)
Alanine transaminase: 28 U/L (ref 10–61)
Albumin, Serum / Plasma: 3.5 g/dL (ref 3.5–5.0)
Alkaline Phosphatase: 81 U/L (ref 38–108)
Anion Gap: 10 (ref 4–14)
Bilirubin, Total: 1.8 mg/dL — ABNORMAL HIGH (ref 0.2–1.2)
Calcium, total, Serum / Plasma: 8.7 mg/dL (ref 8.4–10.5)
Carbon Dioxide, Total: 26 mmol/L (ref 22–29)
Chloride, Serum / Plasma: 111 mmol/L — ABNORMAL HIGH (ref 101–110)
Creatinine: 0.74 mg/dL (ref 0.55–1.02)
Glucose, non-fasting: 72 mg/dL (ref 70–199)
Potassium, Serum / Plasma: 3.4 mmol/L — ABNORMAL LOW (ref 3.5–5.0)
Protein, Total, Serum / Plasma: 6.5 g/dL (ref 6.3–8.6)
Sodium, Serum / Plasma: 147 mmol/L — ABNORMAL HIGH (ref 135–145)
Urea Nitrogen, Serum / Plasma: 15 mg/dL (ref 7–25)
eGFR - high estimate: 103 mL/min (ref >60)
eGFR - low estimate: 89 mL/min (ref >60)

## 2019-06-08 LAB — IGG, SERUM: IgG, serum: 1420 mg/dL (ref 672–1760)

## 2019-06-09 LAB — LACTATE DEHYDROGENASE, BLOOD: Lactate Dehydrogenase, Serum /: 138 U/L (ref 125–243)

## 2019-06-12 LAB — PROTEIN ELECTROPHORESIS, SERUM
Albumin, SPEP: 3.3 g/dL — ABNORMAL LOW (ref 3.4–4.7)
Alpha 1 Globulin: 0.3 g/dL (ref 0.1–0.3)
Alpha 2 Globulin: 0.7 g/dL (ref 0.6–1.0)
Beta Globulin: 1 g/dL (ref 0.7–1.4)
Gamma Globulin: 1.3 g/dL (ref 0.6–1.6)
Paraprotein Concentration: 1.1 g/dL — AB
Prot Electrophoresis: ABNORMAL — AB
Total Protein: 6.5 g/dL (ref 6.3–8.6)

## 2019-06-12 LAB — KAPPA AND LAMBDA FREE LIGHT CH
Kappa Lt Chain, Free: 92.7 mg/L — ABNORMAL HIGH (ref 3.3–19.4)
Kappa/Lambda Ratio: 21.07 — ABNORMAL HIGH (ref 0.26–1.65)
Lambda Lt Chain, Free: 4.4 mg/L — ABNORMAL LOW (ref 5.7–26.3)

## 2019-07-05 ENCOUNTER — Encounter: Admit: 2019-07-05 | Discharge: 2019-07-06 | Payer: PRIVATE HEALTH INSURANCE

## 2019-07-05 DIAGNOSIS — C9000 Multiple myeloma not having achieved remission: Secondary | ICD-10-CM

## 2019-07-05 LAB — COMPLETE BLOOD COUNT WITH DIFF
Abs Basophils: 0.03 10*9/L (ref 0.0–0.1)
Abs Eosinophils: 0.14 10*9/L (ref 0.0–0.4)
Abs Imm Granulocytes: 0.03 10*9/L (ref <0.1)
Abs Lymphocytes: 1.78 10*9/L (ref 1.0–3.4)
Abs Monocytes: 0.54 10*9/L (ref 0.2–0.8)
Abs Neutrophils: 1.21 10*9/L — ABNORMAL LOW (ref 1.8–6.8)
Hematocrit: 39.2 % (ref 36–46)
Hemoglobin: 12.8 g/dL (ref 12.0–15.5)
MCH: 30.9 pg (ref 26–34)
MCHC: 32.7 g/dL (ref 31–36)
MCV: 95 fL (ref 80–100)
Platelet Count: 160 10*9/L (ref 140–450)
RBC Count: 4.14 10*12/L (ref 4.0–5.2)
WBC Count: 3.7 10*9/L (ref 3.4–10)

## 2019-07-05 LAB — COMPREHENSIVE METABOLIC PANEL
AST: 14 U/L (ref 5–44)
Alanine transaminase: 23 U/L (ref 10–61)
Albumin, Serum / Plasma: 3.5 g/dL (ref 3.5–5.0)
Alkaline Phosphatase: 83 U/L (ref 38–108)
Anion Gap: 7 (ref 4–14)
Bilirubin, Total: 1.7 mg/dL — ABNORMAL HIGH (ref 0.2–1.2)
Calcium, total, Serum / Plasma: 8.8 mg/dL (ref 8.4–10.5)
Carbon Dioxide, Total: 26 mmol/L (ref 22–29)
Chloride, Serum / Plasma: 110 mmol/L (ref 101–110)
Creatinine: 0.77 mg/dL (ref 0.55–1.02)
Glucose, non-fasting: 57 mg/dL — ABNORMAL LOW (ref 70–199)
Potassium, Serum / Plasma: 3.1 mmol/L — ABNORMAL LOW (ref 3.5–5.0)
Protein, Total, Serum / Plasma: 6.7 g/dL (ref 6.3–8.6)
Sodium, Serum / Plasma: 143 mmol/L (ref 135–145)
Urea Nitrogen, Serum / Plasma: 15 mg/dL (ref 7–25)
eGFR - high estimate: 98 mL/min (ref >60)
eGFR - low estimate: 85 mL/min (ref >60)

## 2019-07-05 LAB — LACTATE DEHYDROGENASE, BLOOD: Lactate Dehydrogenase, Serum /: 164 U/L (ref 125–243)

## 2019-07-06 LAB — PROTEIN ELECTROPHORESIS, SERUM
Albumin, SPEP: 3.5 g/dL (ref 3.4–4.7)
Alpha 1 Globulin: 0.2 g/dL (ref 0.1–0.3)
Alpha 2 Globulin: 0.7 g/dL (ref 0.6–1.0)
Beta Globulin: 0.9 g/dL (ref 0.7–1.4)
Gamma Globulin: 1.4 g/dL (ref 0.6–1.6)
Paraprotein Concentration: 1.2 g/dL — AB
Prot Electrophoresis: ABNORMAL — AB
Total Protein: 6.7 g/dL (ref 6.3–8.6)

## 2019-07-06 LAB — KAPPA AND LAMBDA FREE LIGHT CH
Kappa Lt Chain, Free: 95.6 mg/L — ABNORMAL HIGH (ref 3.3–19.4)
Kappa/Lambda Ratio: 21.73 — ABNORMAL HIGH (ref 0.26–1.65)
Lambda Lt Chain, Free: 4.4 mg/L — ABNORMAL LOW (ref 5.7–26.3)

## 2019-07-06 LAB — IGG, SERUM: IgG, serum: 1520 mg/dL (ref 672–1760)

## 2019-07-20 ENCOUNTER — Ambulatory Visit: Admit: 2019-07-20 | Discharge: 2019-07-20 | Payer: PRIVATE HEALTH INSURANCE

## 2019-07-20 DIAGNOSIS — C9000 Multiple myeloma not having achieved remission: Secondary | ICD-10-CM

## 2019-07-20 DIAGNOSIS — D848 Other specified immunodeficiencies: Secondary | ICD-10-CM

## 2019-07-20 DIAGNOSIS — Z9484 Stem cells transplant status: Secondary | ICD-10-CM

## 2019-07-20 DIAGNOSIS — M25552 Pain in left hip: Secondary | ICD-10-CM

## 2019-07-20 NOTE — Progress Notes (Signed)
I performed this consultation using real-time Telehealth tools, including a live video connection between my location and the patient's location which was in [place].     Prior to initiating the consultation, I obtained informed verbal consent to perform this consultation using Telehealth tools and answered all the questions about the Telehealth interaction.    Ariel Braun  [id number]    Date of Service:   07/20/19      Ariel Braun is a 58 y.o. female with a history of myeloma, who is being seen through a Telemed visit for follow-up and recommendations.    For review the patient history is as follows:     IgG kappa myeloma, diagnosed 09/07/10    Significant bone involvement, 8% plasma cells, normal cyto 46XX, FISH showed only 3 copies 1q21, Stage 2 ISS (3.88 beta 2 microglobulin),IgG 4750, M-spike 3.2, kappa 586.9 mg/L.    RVD 10/2010 through April 2011 (? 4 cycles). Achieved VGPR with M-spike 0.4, IgG 886, kappa 27.6.    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    8 months post transplant, her M-spike continues to fall (0.2 on 11/26/11), she's doing great.    She has intermittent pain right hip, with x-ray showing old lesion. Would recommend PET/CT.     01/21/12: M-spike 0.3, kappa 16.5, IgG 810. PET/CT neg.    Swelling of feet and nausea (maybe HAE or zometa).     Then had HAE attacks x 2 with abdominal pain and vomiting.     Observation and no bisphos 2/2 her concerns.    08/11/12: Stable M-protein 0.2, IgG 729, kappa 12.3 in August and IgG 723.     09/29/12: MRI shoulder no myeloma. Kappa 17.     12/08/12: HAE attacks more frequent (weekly) but less severe. To receive C 1 esterase inhibitor (Berinert) prior to receiving pneumovax, TdAP, and hep B (12/11/12).     12/18/12. Slow rise in para-protein to M-spike 0.4    01/26/13: Did well with immunizations with Berinert pre-med. Hasn't used [employer] since.    03/14/13: PET/CT no uptake. Meanwhile, M-spike of 0.4, IgG 937,  kappa 18.5.    06/12/13: No HAE. M-spike 0.6, IgG 1270.     08/24/13: M-spike 0.6, IG 1330, kappa 32.3. Continue to observe.     03/29/14: MRI of hip no myeloma. M-spike 1.1.     06/11/14: PET normal.     08/05/14: Started Rev 15 mg 3/4. M-spike 1, IgG 1640, kappa 62.2    09/23/14: Seeing [name]. Less pain in feet since Rev.M-spike 0.8, IgG 1532, kappa 62.    02/17/15: Kappa up a bit to 79.6. Proposed adding Ixazomib to Rev dex if she gets to kappa 100.     06/10/15: Kappa 76.9 Stable on Rev. Although ANC however at about 0.8 to 1.    09/16/15: Kappa 69.9 (down) but ANC 0.8. Will reduce Revlimid to 15 mg qod to allow ANC to rise a bit.    12/09/15: Feels much better on Rev 15 mg qod, but M-spike up to 1.1 and IgG 1660. Will add dex 8 mg weekly and ativan for anxiety and sleep.    04/06/16: Now on dex 4 mg two days in a row, Rev 15 mg q o d.   06/04/16: Kappa 30.1. Cont. QOD Rev 35m. PET scan ordered and scheduled.    08/20/16: Feels fine. Myeloma labs stable with M-spike 1.1, IgG 1500, kappa  59. PET/CT neg from 7/28. Plan to continue Rev 15 mg qod, Dex 8 mg qwk.    12/14/16: Minimal left flank pain which is rare and intermittent. No hematuria. Myeloma labs better if anything with M-spike 0.9 and Kappa 44.2. Continue Rev 15 mg qod and dex 8 mg q week.    03/15/17: Feels well. Pain gone. IgG 1530. Continue Rev 15 mg qod and dex 8 mg weekly.    04/17/17: Will start dex in AM and Rev at night.    06/21/17: Stable M-spike 1.2, IgG 1480, kappa 58.9. Continue Rev 15 mg qod and dex 8 mg q wk.     09/16/17: M-spike stable 1.2. Kappa 52.6. Continue Rev 15 mg qod. Dex 8 mg q wk    09/16/17: Stable. M-spike 0.2, kappa 59. Continue Rev 15 mg qod and dex 8 mg q wk. Note: no HAE episodes in 2 years.    01/10/18: No new symptoms. Stable myeloma. Continue Rev 15 mg qod and dex 8 mg q week.    03/19/18: Multiple PEs. Placed on Eliquis. Hold Rev.    04/11/18: M-spike 1.4, kappa 85. Resume Rev at 15 mg daily (3/4), then qod, Continue dex 8 mg qwk.  Continue Eliquis. Had pneumonia as well as PE's in early May. Need to repeat CT chest due to a mass (3.4 cm x 1.6 cm) at left posterior sulcus.    05/10/18: Rev 15 mg qod.     05/17/18: Repeat CT ([place]) with improvement and no mass or continued PEs. Mild intermittent pain left posterior chest (not pleuritic). M-spike 1.3, IgG 1510, kappa 64 (stable).    08/08/18: Pain in L post chest better. CT scan > resolution of PEs.      12/12/18: KLC 78. Continue on Rev 15 mg qod, dex 8 mg (4/4).    04/17/19: M-spike 1.4, KLC 96.3. ? PD. Up Rev to 15 mg qd (3/4) and dex 8 mg wkly(4/4). Options: Pom/Cy/dex, Dara-based, or Carfilzomib based.    07/20/19:  L hip pain in last wk but relieved with exercise. M-1.2, KLC 96 (stable). Cont. Rev 15 mg (3/4) and dex 8 mg (4/4). Eval pain if it persists.      Interval History: no new medical issues    Past medical, family and social histories, as well as current medications and allergies, were reviewed and updated in the medical record as appropriate.         Review of Systems - Positive findings in bold  General: reviewed and is non-contributory to the illness/disease  Ophthalmic: reviewed and is non-contributory to the illness/disease  ENT: reviewed and is non-contributory to the illness/disease  Hematological and Lymphatic: reviewed and is non-contributory to the illness/disease  Respiratory: no cough, shortness of breath, or wheezing  Cardiovascular: no chest pain or dyspnea on exertion  Gastrointestinal: no abdominal pain, change in bowel habits, or black or bloody stools  Musculoskeletal: L hip pain for a wk. Otherwise, reviewed and is non-contributory to the illness/disease  Neurological: no TIA or stroke symptoms.  No PN.  Dermatological: reviewed and is non-contributory to the illness/disease  The remainder of the systems were reviewed and are non-contributory to the illness/disease      Lab Results   Component Value Date    WBC Count 3.7 07/05/2019    Hemoglobin 12.8 07/05/2019     Hematocrit 39.2 07/05/2019    MCV 95 07/05/2019    Platelet Count 160 07/05/2019     Lab Results   Component Value  Date    Albumin, Serum / Plasma 3.5 07/05/2019      Lab Results   Component Value Date    Creatinine 0.77 07/05/2019    Creatinine Whole Blood 0.8 06/05/2014      Lab Results   Component Value Date    Sodium, Serum / Plasma 143 07/05/2019    Potassium, Serum / Plasma 3.1 (L) 07/05/2019    Chloride, Serum / Plasma 110 07/05/2019    Carbon Dioxide, Total 26 07/05/2019     No results found for: TPUR  Prot Electrophoresis   Date Value Ref Range Status   07/05/2019 Abnormal (A) Normal pattern. 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 Paraprotein spike = 1.2 g/dL.  Interpretation by Pathologist: Clelia Schaumann, M.D.     06/08/2019 Abnormal (A) Normal pattern. Final     Comment:     Hypoalbuminemia with a protein spike in the gamma region, consistent with a monoclonal gammopathy. Paraprotein spike = 1.1 g/dL.  Interpretation by Pathologist: Clelia Schaumann, M.D.     04/03/2019 Abnormal (A) Normal pattern. 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 Paraprotein spike =  1.4 g/dL.  Interpretation by Pathologist: Clelia Schaumann, M.D.       Results for orders placed or performed during the hospital encounter of 07/05/19   Kappa and Lambda Free light Chains, Serum   Result Value Ref Range    Kappa Lt Chain, Free 95.6 (H) 3.3 - 19.4 mg/L    Lambda Lt Chain, Free 4.4 (L) 5.7 - 26.3 mg/L    Kappa/Lambda Ratio 21.73 (H) 0.26 - 1.65   Results for orders placed or performed during the hospital encounter of 06/08/19   Kappa and Lambda Free light Chains, Serum   Result Value Ref Range    Kappa Lt Chain, Free 92.7 (H) 3.3 - 19.4 mg/L    Lambda Lt Chain, Free 4.4 (L) 5.7 - 26.3 mg/L    Kappa/Lambda Ratio 21.07 (H) 0.26 - 1.65   Results for orders placed or performed in visit on 04/03/19   Kappa and Lambda  Free light Chains, Serum   Result Value Ref Range    Kappa Lt Chain, Free 96.3 (H) 3.3 - 19.4 mg/L    Lambda Lt Chain, Free 3.9 (L) 5.7 - 26.3 mg/L    Kappa/Lambda Ratio 24.69 (H) 0.26 - 1.65     Lab Results   Component Value Date    IgA, serum 25 (L) 04/03/2019    IgA, serum 22 (L) 03/16/2018    IgA, serum 44 (L) 01/29/2016    IgG, serum 1,520 07/05/2019    IgG, serum 1,420 06/08/2019    IgG, serum 1,410 05/10/2019    IgM, serum 18 (L) 04/03/2019    IgM, serum 25 (L) 03/16/2018    IgM, serum 33 (L) 01/29/2016               ASSESSMENT and PLAN:    Response to therapy (if on therapy) is: SD       1) Myeloma (CMS code)    L hip pain in last wk but relieved with exercise.     M-1.2, KLC 96 (stable).     Cont. Rev 15 mg (3/4) and dex 8 mg (4/4).     Eval pain if it persists.     2)  Pain and other symptoms: L hip      3)  Labs that need attention: KLC      4)  New problem: ? Pain left hip.  If it persists, consider PET/CT      5)  Referrals: none      6)  Plan for therapy:     Continue Rev 15 mg (3/4) and dex 8 mg (4/4).  Next option Pom/Cy/dex if simply a biochemical relapse.  Dara/Carfilzomib if new bone lesions.     Continue Eliquis for history of PEs          This patient has illness that poses a threat to life or bodily function and is / has been or will be undergoing drug therapy requiring intensive monitoring for toxicity.   I spent 40 minutes face to face with the patient of which 35 minutes of that time were spent in consultation and discussion and counseling regarding the diagnosis, the treatment plan, medication risks, lifestyle modification and symptoms.

## 2019-07-21 NOTE — Assessment & Plan Note (Signed)
L hip pain in last wk but relieved with exercise.     M-1.2, KLC 96 (stable).     Cont. Rev 15 mg (3/4) and dex 8 mg (4/4).     Eval pain if it persists.

## 2019-08-03 DIAGNOSIS — C9 Multiple myeloma not having achieved remission (CMS code): Secondary

## 2019-08-03 LAB — COMPREHENSIVE METABOLIC PANEL
AST: 16 U/L (ref 5–44)
Alanine transaminase: 30 U/L (ref 10–61)
Albumin, Serum / Plasma: 4 g/dL (ref 3.5–5.0)
Alkaline Phosphatase: 88 U/L (ref 38–108)
Anion Gap: 11 (ref 4–14)
Bilirubin, Total: 2 mg/dL — ABNORMAL HIGH (ref 0.2–1.2)
Calcium, total, Serum / Plasma: 9.1 mg/dL (ref 8.4–10.5)
Carbon Dioxide, Total: 26 mmol/L (ref 22–29)
Chloride, Serum / Plasma: 106 mmol/L (ref 101–110)
Creatinine: 0.86 mg/dL (ref 0.55–1.02)
Glucose, non-fasting: 66 mg/dL — ABNORMAL LOW (ref 70–199)
Potassium, Serum / Plasma: 3 mmol/L — ABNORMAL LOW (ref 3.5–5.0)
Protein, Total, Serum / Plasma: 7.2 g/dL (ref 6.3–8.6)
Sodium, Serum / Plasma: 143 mmol/L (ref 135–145)
Urea Nitrogen, Serum / Plasma: 18 mg/dL (ref 7–25)
eGFR - high estimate: 86 mL/min (ref >60)
eGFR - low estimate: 74 mL/min (ref >60)

## 2019-08-03 LAB — COMPLETE BLOOD COUNT WITH DIFF
Abs Basophils: 0.03 10*9/L (ref 0.0–0.1)
Abs Eosinophils: 0.13 10*9/L (ref 0.0–0.4)
Abs Imm Granulocytes: 0.02 10*9/L (ref <0.1)
Abs Lymphocytes: 1.3 10*9/L (ref 1.0–3.4)
Abs Monocytes: 0.44 10*9/L (ref 0.2–0.8)
Abs Neutrophils: 2.02 10*9/L (ref 1.8–6.8)
Hematocrit: 45 % (ref 36–46)
Hemoglobin: 13.5 g/dL (ref 12.0–15.5)
MCH: 31.2 pg (ref 26–34)
MCHC: 30 g/dL — ABNORMAL LOW (ref 31–36)
MCV: 104 fL — ABNORMAL HIGH (ref 80–100)
Platelet Count: 161 10*9/L (ref 140–450)
RBC Count: 4.33 10*12/L (ref 4.0–5.2)
WBC Count: 3.9 10*9/L (ref 3.4–10)

## 2019-08-03 LAB — LACTATE DEHYDROGENASE, BLOOD: Lactate Dehydrogenase, Serum /: 157 U/L (ref 125–243)

## 2019-08-03 LAB — IGG, SERUM: IgG, serum: 1490 mg/dL (ref 672–1760)

## 2019-08-03 MED ORDER — POTASSIUM CHLORIDE ER 10 MEQ TABLET,EXTENDED RELEASE(PART/CRYST)
10 mEq | ORAL_TABLET | Freq: Every day | ORAL | 1 refills | Status: DC
Start: 2019-08-03 — End: 2019-09-06

## 2019-08-03 MED ORDER — POTASSIUM CHLORIDE ER 10 MEQ TABLET,EXTENDED RELEASE(PART/CRYST)
10 | ORAL_TABLET | Freq: Every day | ORAL | 1 refills | Status: DC
Start: 2019-08-03 — End: 2019-08-03

## 2019-08-07 LAB — PROTEIN ELECTROPHORESIS, SERUM
Albumin, SPEP: 3.6 g/dL (ref 3.4–4.7)
Alpha 1 Globulin: 0.3 g/dL (ref 0.1–0.3)
Alpha 2 Globulin: 0.7 g/dL (ref 0.6–1.0)
Beta Globulin: 1.1 g/dL (ref 0.7–1.4)
Gamma Globulin: 1.4 g/dL (ref 0.6–1.6)
Paraprotein Concentration: 1.2 g/dL — AB
Prot Electrophoresis: ABNORMAL — AB
Total Protein: 7.2 g/dL (ref 6.3–8.6)

## 2019-08-07 LAB — KAPPA AND LAMBDA FREE LIGHT CH
Kappa Lt Chain, Free: 101.1 mg/L — ABNORMAL HIGH (ref 3.3–19.4)
Kappa/Lambda Ratio: 24.07 — ABNORMAL HIGH (ref 0.26–1.65)
Lambda Lt Chain, Free: 4.2 mg/L — ABNORMAL LOW (ref 5.7–26.3)

## 2019-08-21 ENCOUNTER — Ambulatory Visit: Admit: 2019-08-22 | Payer: PRIVATE HEALTH INSURANCE

## 2019-08-21 DIAGNOSIS — C9 Multiple myeloma not having achieved remission (CMS code): Secondary

## 2019-08-21 DIAGNOSIS — Z9484 Stem cells transplant status: Secondary | ICD-10-CM

## 2019-08-21 DIAGNOSIS — M79606 Pain in leg, unspecified: Secondary | ICD-10-CM

## 2019-08-21 DIAGNOSIS — D849 Immunodeficiency, unspecified: Secondary | ICD-10-CM

## 2019-08-21 NOTE — Progress Notes (Signed)
I performed this consultation using real-time Telehealth tools, including a live video connection between my location and the patient's location which was in [place].     Prior to initiating the consultation, I obtained informed verbal consent to perform this consultation using Telehealth tools and answered all the questions about the Telehealth interaction.    Ariel Braun  [id number]    Date of Service:   08/21/19      Ariel Braun is a 58 y.o. female with a history of myeloma, managed for 9 years with minimal therapy of auto-PSC-T and then Revlimid,  who is being seen through a Telemed visit for follow-up and co-management with Dr, Iona Coach in [facility].    For review the patient history is as follows:     IgG kappa myeloma, diagnosed 09/07/10    Significant bone involvement, 8% plasma cells, normal cyto 46XX, FISH showed only 3 copies 1q21, Stage 2 ISS (3.88 beta 2 microglobulin),IgG 4750, M-spike 3.2, kappa 586.9 mg/L.    RVD 10/2010 through April 2011 (? 4 cycles). Achieved VGPR with M-spike 0.4, IgG 886, kappa 27.6.    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    8 months post transplant, her M-spike continues to fall (0.2 on 11/26/11), she's doing great.    She has intermittent pain right hip, with x-ray showing old lesion. Would recommend PET/CT.     01/21/12: M-spike 0.3, kappa 16.5, IgG 810. PET/CT neg.    Swelling of feet and nausea (maybe HAE or zometa).     Then had HAE attacks x 2 with abdominal pain and vomiting.     Observation and no bisphos 2/2 her concerns.    08/11/12: Stable M-protein 0.2, IgG 729, kappa 12.3 in August and IgG 723.     09/29/12: MRI shoulder no myeloma. Kappa 17.     12/08/12: HAE attacks more frequent (weekly) but less severe. To receive C 1 esterase inhibitor (Berinert) prior to receiving pneumovax, TdAP, and hep B (12/11/12).     12/18/12. Slow rise in para-protein to M-spike 0.4    01/26/13: Did well with immunizations with  Berinert pre-med. Hasn't used [employer] since.    03/14/13: PET/CT no uptake. Meanwhile, M-spike of 0.4, IgG 937, kappa 18.5.    06/12/13: No HAE. M-spike 0.6, IgG 1270.     08/24/13: M-spike 0.6, IG 1330, kappa 32.3. Continue to observe.     03/29/14: MRI of hip no myeloma. M-spike 1.1.     06/11/14: PET normal.     08/05/14: Started Rev 15 mg 3/4. M-spike 1, IgG 1640, kappa 62.2    09/23/14: Seeing [name]. Less pain in feet since Rev.M-spike 0.8, IgG 1532, kappa 62.    02/17/15: Kappa up a bit to 79.6. Proposed adding Ixazomib to Rev dex if she gets to kappa 100.     06/10/15: Kappa 76.9 Stable on Rev. Although ANC however at about 0.8 to 1.    09/16/15: Kappa 69.9 (down) but ANC 0.8. Will reduce Revlimid to 15 mg qod to allow ANC to rise a bit.    12/09/15: Feels much better on Rev 15 mg qod, but M-spike up to 1.1 and IgG 1660. Will add dex 8 mg weekly and ativan for anxiety and sleep.    04/06/16: Now on dex 4 mg two days in a row, Rev 15 mg q o d.   06/04/16: Kappa 30.1. Cont. QOD Rev '15mg'$ . PET  scan ordered and scheduled.    08/20/16: Feels fine. Myeloma labs stable with M-spike 1.1, IgG 1500, kappa 59. PET/CT neg from 7/28. Plan to continue Rev 15 mg qod, Dex 8 mg qwk.    12/14/16: Minimal left flank pain which is rare and intermittent. No hematuria. Myeloma labs better if anything with M-spike 0.9 and Kappa 44.2. Continue Rev 15 mg qod and dex 8 mg q week.    03/15/17: Feels well. Pain gone. IgG 1530. Continue Rev 15 mg qod and dex 8 mg weekly.    04/17/17: Will start dex in AM and Rev at night.    06/21/17: Stable M-spike 1.2, IgG 1480, kappa 58.9. Continue Rev 15 mg qod and dex 8 mg q wk.     09/16/17: M-spike stable 1.2. Kappa 52.6. Continue Rev 15 mg qod. Dex 8 mg q wk    09/16/17: Stable. M-spike 0.2, kappa 59. Continue Rev 15 mg qod and dex 8 mg q wk. Note: no HAE episodes in 2 years.    03/19/18: Multiple PEs. Placed on Eliquis. Hold Rev.    04/11/18: M-spike 1.4, kappa 85. Resume Rev at 15 mg daily (3/4), then qod,  Continue dex 8 mg qwk. Continue Eliquis. Had pneumonia as well as PE's in early May. Need to repeat CT chest due to a mass (3.4 cm x 1.6 cm) at left posterior sulcus.    05/10/18: Rev 15 mg qod.     05/17/18: Repeat CT ([place]) with improvement and no mass or continued PEs. Mild intermittent pain left posterior chest (not pleuritic). M-spike 1.3, IgG 1510, kappa 64 (stable).    08/08/18: Pain in L post chest better. CT scan > resolution of PEs.     12/12/18: KLC 78. Rev 15 mg qod, dex 8 mg (4/4).    04/17/19: M-spike 1.4, KLC 96.3. ? PD. Up Rev to 15 mg qd (3/4) and dex 8 mg wkly(4/4).      07/20/19: L hip pain in last wk but relieved with exercise. M-1.2, KLC 96 (stable). Cont. Rev 15 mg (3/4) and dex 8 mg (4/4).     08/21/19:  Migratory and transitory pains in legs. KLC 101, M-1.2.  Continue Rev 15 (3/4)/dex 8 (4/4). Eval pain if it persists. PCD an option.      Interval History: migratory and transitory aches and pains    Past medical, family and social histories, as well as current medications and allergies, were reviewed and updated in the medical record as appropriate.         Review of Systems - Positive findings in bold  General: reviewed and is non-contributory to the illness/disease  Ophthalmic: reviewed and is non-contributory to the illness/disease  ENT: reviewed and is non-contributory to the illness/disease  Hematological and Lymphatic: reviewed and is non-contributory to the illness/disease  Respiratory: no cough, shortness of breath, or wheezing  Cardiovascular: no chest pain or dyspnea on exertion  Gastrointestinal: no abdominal pain, change in bowel habits, or black or bloody stools  Musculoskeletal: Migratory and transitory aches and pains. Otherwise, reviewed and is non-contributory to the illness/disease  Neurological: no TIA or stroke symptoms.  No PN.  Dermatological: reviewed and is non-contributory to the illness/disease  The remainder of the systems were reviewed and are non-contributory to the  illness/disease      Lab Results   Component Value Date    WBC Count 3.9 08/03/2019    Hemoglobin 13.5 08/03/2019    Hematocrit 45.0 08/03/2019    MCV 104 (  H) 08/03/2019    Platelet Count 161 08/03/2019     Lab Results   Component Value Date    Albumin, Serum / Plasma 4.0 08/03/2019      Lab Results   Component Value Date    Creatinine 0.86 08/03/2019    Creatinine Whole Blood 0.8 06/05/2014      Lab Results   Component Value Date    Sodium, Serum / Plasma 143 08/03/2019    Potassium, Serum / Plasma 3.0 (L) 08/03/2019    Chloride, Serum / Plasma 106 08/03/2019    Carbon Dioxide, Total 26 08/03/2019     No results found for: TPUR  Prot Electrophoresis   Date Value Ref Range Status   08/03/2019 Abnormal (A) Normal pattern. 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 Paraprotein spike = 1.2 g/dL.  Interpretation by Pathologist: Clelia Schaumann, M.D.     07/05/2019 Abnormal (A) Normal pattern. 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 Paraprotein spike = 1.2 g/dL.  Interpretation by Pathologist: Clelia Schaumann, M.D.     06/08/2019 Abnormal (A) Normal pattern. Final     Comment:     Hypoalbuminemia with a protein spike in the gamma region, consistent with a monoclonal gammopathy. Paraprotein spike = 1.1 g/dL.  Interpretation by Pathologist: Clelia Schaumann, M.D.       Results for orders placed or performed during the hospital encounter of 08/03/19   Kappa and Lambda Free light Chains, Serum   Result Value Ref Range    Kappa Lt Chain, Free 101.1 (H) 3.3 - 19.4 mg/L    Lambda Lt Chain, Free 4.2 (L) 5.7 - 26.3 mg/L    Kappa/Lambda Ratio 24.07 (H) 0.26 - 1.65   Results for orders placed or performed during the hospital encounter of 07/05/19   Kappa and Lambda Free light Chains, Serum   Result Value Ref Range    Kappa Lt Chain, Free 95.6 (H) 3.3 - 19.4 mg/L    Lambda Lt Chain, Free 4.4 (L) 5.7 - 26.3 mg/L     Kappa/Lambda Ratio 21.73 (H) 0.26 - 1.65   Results for orders placed or performed during the hospital encounter of 06/08/19   Kappa and Lambda Free light Chains, Serum   Result Value Ref Range    Kappa Lt Chain, Free 92.7 (H) 3.3 - 19.4 mg/L    Lambda Lt Chain, Free 4.4 (L) 5.7 - 26.3 mg/L    Kappa/Lambda Ratio 21.07 (H) 0.26 - 1.65     Lab Results   Component Value Date    IgA, serum 25 (L) 04/03/2019    IgA, serum 22 (L) 03/16/2018    IgA, serum 44 (L) 01/29/2016    IgG, serum 1,490 08/03/2019    IgG, serum 1,520 07/05/2019    IgG, serum 1,420 06/08/2019    IgM, serum 18 (L) 04/03/2019    IgM, serum 25 (L) 03/16/2018    IgM, serum 33 (L) 01/29/2016               ASSESSMENT and PLAN:    Response to therapy (if on therapy) is: Slow progression with KLC 63 -> 101 over the last year       1) Myeloma (CMS code)    Migratory and transitory pains in legs.     KLC 101, M-spike 1.2.  Continue Rev 15 (3/4)/dex 8 (4/4).     Eval pain if it persists.     PCD an option.    2)  Pain and other symptoms: minimal      3)  Labs that need attention: KLC      4)  New problem: slow rise in [place]      5)  Referrals: none      6)  Plan for therapy: Continue Revlimid15 mg(3/4) and dex 8 mg (4/4) , but next option would be Pom/Cy/dex.          This patient has illness that poses a threat to life or bodily function and is / has been or will be undergoing drug therapy requiring intensive monitoring for toxicity.   I spent 40 minutes face to face with the patient of which 35 minutes of that time were spent in consultation and discussion and counseling regarding the diagnosis, the treatment plan, medication risks, lifestyle modification and symptoms.

## 2019-08-22 NOTE — Assessment & Plan Note (Signed)
Migratory and transitory pains in legs.     KLC 101, M-spike 1.2.      Continue Rev 15 (3/4)/dex 8 (4/4).     Eval pain if it persists.     PCD an option.

## 2019-09-04 LAB — PROTEIN ELECTROPHORESIS, SERUM
A/G Ratio: 1 (ref 0.7–1.7)
Albumin, SPEP: 3.3 g/dL (ref 2.9–4.4)
Alpha 1 Globulin: 0.3 g/dL (ref 0.0–0.4)
Alpha 2 Globulin: 0.6 g/dL (ref 0.4–1.0)
Beta Globulin: 1 g/dL (ref 0.7–1.3)
Gamma Globulin: 1.3 g/dL (ref 0.4–1.8)
Globulin, Total: 3.2 g/dL (ref 2.2–3.9)
M-Spike, serum: 1.1 g/dL — ABNORMAL HIGH

## 2019-09-04 LAB — COMPREHENSIVE METABOLIC PANEL
AST: 18 IU/L (ref 0–40)
Alanine transaminase: 24 IU/L (ref 0–32)
Albumin, Serum / Plasma: 4.1 g/dL (ref 3.8–4.9)
Albumin/Globulin Ratio: 1.7 (ref 1.2–2.2)
Alkaline Phosphatase: 97 IU/L (ref 39–117)
BUN/Creatinine Ratio (External: 14 (ref 9–23)
Bilirubin, Total: 1.1 mg/dL (ref 0.0–1.2)
Calcium, total, Serum / Plasma: 9 mg/dL (ref 8.7–10.2)
Carbon Dioxide, Total: 25 mmol/L (ref 20–29)
Chloride, Serum / Plasma: 106 mmol/L (ref 96–106)
Creatinine: 0.74 mg/dL (ref 0.57–1.00)
Globulin, Total: 2.4 g/dL (ref 1.5–4.5)
Glucose, non-fasting: 94 mg/dL (ref 65–99)
Potassium, Serum / Plasma: 3.1 mmol/L — ABNORMAL LOW (ref 3.5–5.2)
Protein, Total, Serum / Plasma: 6.5 g/dL (ref 6.0–8.5)
Sodium, Serum / Plasma: 143 mmol/L (ref 134–144)
Urea Nitrogen, Serum / Plasma: 10 mg/dL (ref 6–24)
eGFR - high estimate: 103 mL/min/{1.73_m2} (ref >59)
eGFR - low estimate: 89 mL/min/{1.73_m2} (ref >59)

## 2019-09-04 LAB — IGG, SERUM: IgG: 1410 mg/dL (ref 586–1602)

## 2019-09-04 LAB — COMPLETE BLOOD COUNT WITH DIFF
% Basophils: 2 %
% Eosinophils: 3 %
% Imm Granulocytes: 1 %
% Monocytes: 14 %
% Neutrophils: 37 %
Abs Basophils: 0.1 10*3/uL (ref 0.0–0.2)
Abs Eosinophils: 0.1 10*3/uL (ref 0.0–0.4)
Abs Imm Granulocytes: 0 10*3/uL (ref 0.0–0.1)
Abs Lymphocytes: 1.3 10*3/uL (ref 0.7–3.1)
Abs Monocytes: 0.4 10*3/uL (ref 0.1–0.9)
Hematocrit: 37.3 % (ref 34.0–46.6)
Hemoglobin: 12.5 g/dL (ref 11.1–15.9)
Lymphs: 43 %
MCH: 31.3 pg (ref 26.6–33.0)
MCHC: 33.5 g/dL (ref 31.5–35.7)
MCV: 94 fL (ref 79–97)
Neutrophils Absolute: 1.1 10*3/uL — ABNORMAL LOW (ref 1.4–7.0)
Platelet Count: 173 10*3/uL (ref 150–450)
RBC Count: 3.99 x10E6/uL (ref 3.77–5.28)
RDW: 14.7 % (ref 11.7–15.4)
WBC Count: 3 10*3/uL — ABNORMAL LOW (ref 3.4–10.8)

## 2019-09-04 LAB — IGA, SERUM: IgA: 22 mg/dL — ABNORMAL LOW (ref 87–352)

## 2019-09-04 LAB — IGM, SERUM: IgM: 13 mg/dL — ABNORMAL LOW (ref 26–217)

## 2019-09-04 LAB — LACTATE DEHYDROGENASE, BLOOD: Lactate Dehydrogenase: 146 IU/L (ref 119–226)

## 2019-09-04 LAB — KAPPA AND LAMBDA FREE LIGHT CH
Kappa Lt Chain, Free: 87 mg/L — ABNORMAL HIGH (ref 3.3–19.4)
Kappa/Lambda Ratio: 21.75 — ABNORMAL HIGH (ref 0.26–1.65)
Lambda Lt Chain, Free: 4 mg/L — ABNORMAL LOW (ref 5.7–26.3)

## 2019-09-06 MED ORDER — POTASSIUM CHLORIDE ER 10 MEQ TABLET,EXTENDED RELEASE(PART/CRYST)
10 | ORAL | 1 refills | Status: DC
Start: 2019-09-06 — End: 2019-10-08

## 2019-09-11 MED ORDER — ACYCLOVIR 400 MG TABLET
400 mg | ORAL | 1 refills | Status: DC
Start: 2019-09-11 — End: 2020-03-07

## 2019-09-28 LAB — KAPPA/LAMBDA QUANT FREE LIGHT

## 2019-10-02 LAB — COMPLETE BLOOD COUNT WITH DIFF
% Basophils: 1 %
% Eosinophils: 3 %
% Monocytes: 3 %
% Neutrophils: 44 %
% nrbc: 1 % — ABNORMAL HIGH (ref 0–0)
Abs Basophils: 0 10*3/uL (ref 0.0–0.2)
Abs Eosinophils: 0.1 10*3/uL (ref 0.0–0.4)
Abs Lymphocytes: 1.4 10*3/uL (ref 0.7–3.1)
Abs Monocytes: 0.1 10*3/uL (ref 0.1–0.9)
Hematocrit: 37.7 % (ref 34.0–46.6)
Hemoglobin: 12.7 g/dL (ref 11.1–15.9)
Lymphs: 49 %
MCH: 31.7 pg (ref 26.6–33.0)
MCHC: 33.7 g/dL (ref 31.5–35.7)
MCV: 94 fL (ref 79–97)
Neutrophils Absolute: 1.2 10*3/uL — ABNORMAL LOW (ref 1.4–7.0)
Platelet Count: 172 10*3/uL (ref 150–450)
RBC Count: 4.01 x10E6/uL (ref 3.77–5.28)
RDW: 14.3 % (ref 11.7–15.4)
WBC Count: 2.8 10*3/uL — ABNORMAL LOW (ref 3.4–10.8)

## 2019-10-02 LAB — PROTEIN ELECTROPHORESIS, SERUM
A/G Ratio: 1.2 (ref 0.7–1.7)
Albumin, SPEP: 3.4 g/dL (ref 2.9–4.4)
Alpha 1 Globulin: 0.3 g/dL (ref 0.0–0.4)
Alpha 2 Globulin: 0.6 g/dL (ref 0.4–1.0)
Beta Globulin: 0.9 g/dL (ref 0.7–1.3)
Gamma Globulin: 1.2 g/dL (ref 0.4–1.8)
Globulin, Total: 2.9 g/dL (ref 2.2–3.9)
M-Spike, serum: 1.1 g/dL — ABNORMAL HIGH

## 2019-10-02 LAB — COMPREHENSIVE METABOLIC PANEL
AST: 19 IU/L (ref 0–40)
Alanine transaminase: 30 IU/L (ref 0–32)
Albumin, Serum / Plasma: 4 g/dL (ref 3.8–4.9)
Albumin/Globulin Ratio: 1.7 (ref 1.2–2.2)
Alkaline Phosphatase: 107 IU/L (ref 39–117)
BUN/Creatinine Ratio (External: 14 (ref 9–23)
Bilirubin, Total: 1 mg/dL (ref 0.0–1.2)
Calcium, total, Serum / Plasma: 9.2 mg/dL (ref 8.7–10.2)
Carbon Dioxide, Total: 26 mmol/L (ref 20–29)
Chloride, Serum / Plasma: 106 mmol/L (ref 96–106)
Creatinine: 0.74 mg/dL (ref 0.57–1.00)
Globulin, Total: 2.3 g/dL (ref 1.5–4.5)
Glucose, non-fasting: 94 mg/dL (ref 65–99)
Potassium, Serum / Plasma: 3.2 mmol/L — ABNORMAL LOW (ref 3.5–5.2)
Protein, Total, Serum / Plasma: 6.3 g/dL (ref 6.0–8.5)
Sodium, Serum / Plasma: 144 mmol/L (ref 134–144)
Urea Nitrogen, Serum / Plasma: 10 mg/dL (ref 6–24)
eGFR - high estimate: 103 mL/min/{1.73_m2} (ref >59)
eGFR - low estimate: 89 mL/min/{1.73_m2} (ref >59)

## 2019-10-02 LAB — KAPPA AND LAMBDA FREE LIGHT CH
Kappa Lt Chain, Free: 90.1 mg/L — ABNORMAL HIGH (ref 3.3–19.4)
Kappa/Lambda Ratio: 20.95 — ABNORMAL HIGH (ref 0.26–1.65)
Lambda Lt Chain, Free: 4.3 mg/L — ABNORMAL LOW (ref 5.7–26.3)

## 2019-10-02 LAB — LACTATE DEHYDROGENASE, BLOOD: Lactate Dehydrogenase: 157 IU/L (ref 119–226)

## 2019-10-02 LAB — IGM, SERUM: IgM: 13 mg/dL — ABNORMAL LOW (ref 26–217)

## 2019-10-02 LAB — IGA, SERUM: IgA: 21 mg/dL — ABNORMAL LOW (ref 87–352)

## 2019-10-02 LAB — IGG, SERUM: IgG: 1354 mg/dL (ref 586–1602)

## 2019-10-08 MED ORDER — POTASSIUM CHLORIDE ER 10 MEQ TABLET,EXTENDED RELEASE(PART/CRYST)
10 | ORAL | 1 refills | Status: DC
Start: 2019-10-08 — End: 2019-10-22

## 2019-10-22 MED ORDER — POTASSIUM CHLORIDE ER 10 MEQ TABLET,EXTENDED RELEASE(PART/CRYST)
10 | ORAL | 1 refills | Status: DC
Start: 2019-10-22 — End: 2020-10-14

## 2019-10-27 LAB — LACTATE DEHYDROGENASE, BLOOD: Lactate Dehydrogenase: 134 IU/L (ref 119–226)

## 2019-10-27 LAB — COMPREHENSIVE METABOLIC PANEL
AST: 14 IU/L (ref 0–40)
Alanine transaminase: 22 IU/L (ref 0–32)
Albumin, Serum / Plasma: 4 g/dL (ref 3.8–4.9)
Albumin/Globulin Ratio: 1.5 (ref 1.2–2.2)
Alkaline Phosphatase: 94 IU/L (ref 39–117)
BUN/Creatinine Ratio (External: 14 (ref 9–23)
Bilirubin, Total: 1.2 mg/dL (ref 0.0–1.2)
Calcium, total, Serum / Plasma: 9 mg/dL (ref 8.7–10.2)
Carbon Dioxide, Total: 22 mmol/L (ref 20–29)
Chloride, Serum / Plasma: 105 mmol/L (ref 96–106)
Creatinine: 0.78 mg/dL (ref 0.57–1.00)
Globulin, Total: 2.6 g/dL (ref 1.5–4.5)
Glucose, non-fasting: 83 mg/dL (ref 65–99)
Potassium, Serum / Plasma: 3.3 mmol/L — ABNORMAL LOW (ref 3.5–5.2)
Protein, Total, Serum / Plasma: 6.6 g/dL (ref 6.0–8.5)
Sodium, Serum / Plasma: 143 mmol/L (ref 134–144)
Urea Nitrogen, Serum / Plasma: 11 mg/dL (ref 6–24)
eGFR - high estimate: 96 mL/min/{1.73_m2} (ref >59)
eGFR - low estimate: 83 mL/min/{1.73_m2} (ref >59)

## 2019-10-27 LAB — COMPLETE BLOOD COUNT WITH DIFF
% Basophils: 1 %
% Eosinophils: 4 %
% Imm Granulocytes: 1 %
% Monocytes: 13 %
% Neutrophils: 38 %
Abs Basophils: 0 10*3/uL (ref 0.0–0.2)
Abs Eosinophils: 0.1 10*3/uL (ref 0.0–0.4)
Abs Imm Granulocytes: 0 10*3/uL (ref 0.0–0.1)
Abs Lymphocytes: 1.4 10*3/uL (ref 0.7–3.1)
Abs Monocytes: 0.4 10*3/uL (ref 0.1–0.9)
Hematocrit: 36.3 % (ref 34.0–46.6)
Hemoglobin: 12.3 g/dL (ref 11.1–15.9)
Lymphs: 43 %
MCH: 31.8 pg (ref 26.6–33.0)
MCHC: 33.9 g/dL (ref 31.5–35.7)
MCV: 94 fL (ref 79–97)
Neutrophils Absolute: 1.2 10*3/uL — ABNORMAL LOW (ref 1.4–7.0)
Platelet Count: 160 10*3/uL (ref 150–450)
RBC Count: 3.87 x10E6/uL (ref 3.77–5.28)
RDW: 14.5 % (ref 11.7–15.4)
WBC Count: 3.2 10*3/uL — ABNORMAL LOW (ref 3.4–10.8)

## 2019-11-01 LAB — KAPPA AND LAMBDA FREE LIGHT CH
Kappa Lt Chain, Free: 83.5 mg/L — ABNORMAL HIGH (ref 3.3–19.4)
Kappa/Lambda Ratio: 17.77 — ABNORMAL HIGH (ref 0.26–1.65)
Lambda Lt Chain, Free: 4.7 mg/L — ABNORMAL LOW (ref 5.7–26.3)

## 2019-11-01 LAB — IGG, SERUM: IgG: 1342 mg/dL (ref 586–1602)

## 2019-11-01 LAB — PROTEIN ELECTROPHORESIS, SERUM
A/G Ratio: 1 (ref 0.7–1.7)
Albumin, SPEP: 3.3 g/dL (ref 2.9–4.4)
Alpha 1 Globulin: 0.3 g/dL (ref 0.0–0.4)
Alpha 2 Globulin: 0.7 g/dL (ref 0.4–1.0)
Beta Globulin: 1 g/dL (ref 0.7–1.3)
Gamma Globulin: 1.2 g/dL (ref 0.4–1.8)
Globulin, Total: 3.2 g/dL (ref 2.2–3.9)
M-Spike, serum: 1.1 g/dL — ABNORMAL HIGH
Protein, Total, Serum / Plasma: 6.5 g/dL (ref 6.0–8.5)

## 2019-11-01 LAB — IGA, SERUM: IgA: 22 mg/dL — ABNORMAL LOW (ref 87–352)

## 2019-11-01 LAB — IGM, SERUM: IgM: 24 mg/dL — ABNORMAL LOW (ref 26–217)

## 2019-11-27 LAB — PROTEIN ELECTROPHORESIS, SERUM
A/G Ratio: 0.9 (ref 0.7–1.7)
Albumin, SPEP: 3.2 g/dL (ref 2.9–4.4)
Alpha 1 Globulin: 0.3 g/dL (ref 0.0–0.4)
Alpha 2 Globulin: 0.7 g/dL (ref 0.4–1.0)
Beta Globulin: 1.1 g/dL (ref 0.7–1.3)
Gamma Globulin: 1.3 g/dL (ref 0.4–1.8)
Globulin, Total: 3.4 g/dL (ref 2.2–3.9)
M-Spike, serum: 1.2 g/dL — ABNORMAL HIGH
Protein, Total, Serum / Plasma: 6.6 g/dL (ref 6.0–8.5)

## 2019-11-27 LAB — COMPLETE BLOOD COUNT WITH DIFF
% Basophils: 1 %
% Eosinophils: 3 %
% Imm Granulocytes: 1 %
% Monocytes: 13 %
% Neutrophils: 42 %
Abs Basophils: 0 10*3/uL (ref 0.0–0.2)
Abs Eosinophils: 0.1 10*3/uL (ref 0.0–0.4)
Abs Imm Granulocytes: 0 10*3/uL (ref 0.0–0.1)
Abs Lymphocytes: 1.1 10*3/uL (ref 0.7–3.1)
Abs Monocytes: 0.4 10*3/uL (ref 0.1–0.9)
Hematocrit: 37.9 % (ref 34.0–46.6)
Hemoglobin: 12.5 g/dL (ref 11.1–15.9)
Lymphs: 40 %
MCH: 30.2 pg (ref 26.6–33.0)
MCHC: 33 g/dL (ref 31.5–35.7)
MCV: 92 fL (ref 79–97)
Neutrophils Absolute: 1.2 10*3/uL — ABNORMAL LOW (ref 1.4–7.0)
Platelet Count: 187 10*3/uL (ref 150–450)
RBC Count: 4.14 x10E6/uL (ref 3.77–5.28)
RDW: 14.6 % (ref 11.7–15.4)
WBC Count: 2.8 10*3/uL — ABNORMAL LOW (ref 3.4–10.8)

## 2019-11-27 LAB — LACTATE DEHYDROGENASE, BLOOD: Lactate Dehydrogenase: 152 IU/L (ref 119–226)

## 2019-11-27 LAB — COMPREHENSIVE METABOLIC PANEL
AST: 18 IU/L (ref 0–40)
Alanine transaminase: 23 IU/L (ref 0–32)
Albumin, Serum / Plasma: 4 g/dL (ref 3.8–4.9)
Albumin/Globulin Ratio: 1.5 (ref 1.2–2.2)
Alkaline Phosphatase: 100 IU/L (ref 39–117)
BUN/Creatinine Ratio (External: 17 (ref 9–23)
Bilirubin, Total: 1.5 mg/dL — ABNORMAL HIGH (ref 0.0–1.2)
Calcium, total, Serum / Plasma: 9.1 mg/dL (ref 8.7–10.2)
Carbon Dioxide, Total: 23 mmol/L (ref 20–29)
Chloride, Serum / Plasma: 105 mmol/L (ref 96–106)
Creatinine: 0.76 mg/dL (ref 0.57–1.00)
Globulin, Total: 2.6 g/dL (ref 1.5–4.5)
Glucose, non-fasting: 91 mg/dL (ref 65–99)
Potassium, Serum / Plasma: 3.3 mmol/L — ABNORMAL LOW (ref 3.5–5.2)
Protein, Total, Serum / Plasma: 6.6 g/dL (ref 6.0–8.5)
Sodium, Serum / Plasma: 141 mmol/L (ref 134–144)
Urea Nitrogen, Serum / Plasma: 13 mg/dL (ref 6–24)
eGFR - high estimate: 99 mL/min/{1.73_m2} (ref >59)
eGFR - low estimate: 86 mL/min/{1.73_m2} (ref >59)

## 2019-11-27 LAB — KAPPA AND LAMBDA FREE LIGHT CH
Kappa Lt Chain, Free: 88.2 mg/L — ABNORMAL HIGH (ref 3.3–19.4)
Kappa/Lambda Ratio: 20.51 — ABNORMAL HIGH (ref 0.26–1.65)
Lambda Lt Chain, Free: 4.3 mg/L — ABNORMAL LOW (ref 5.7–26.3)

## 2019-11-27 LAB — IGA, SERUM: IgA: 25 mg/dL — ABNORMAL LOW (ref 87–352)

## 2019-11-27 LAB — IGG, SERUM: IgG: 1374 mg/dL (ref 586–1602)

## 2019-11-27 LAB — IGM, SERUM: IgM: 18 mg/dL — ABNORMAL LOW (ref 26–217)

## 2019-12-11 ENCOUNTER — Ambulatory Visit: Admit: 2019-12-14 | Payer: PRIVATE HEALTH INSURANCE

## 2019-12-11 DIAGNOSIS — C9000 Multiple myeloma not having achieved remission: Secondary | ICD-10-CM

## 2019-12-11 DIAGNOSIS — M25552 Pain in left hip: Secondary | ICD-10-CM

## 2019-12-11 DIAGNOSIS — Z9484 Stem cells transplant status: Secondary | ICD-10-CM

## 2019-12-11 DIAGNOSIS — D849 Immunodeficiency, unspecified: Secondary | ICD-10-CM

## 2019-12-11 NOTE — Progress Notes (Signed)
I performed this consultation using real-time Telehealth tools, including a live video connection between my location and the patient's location which was in [place].     Prior to initiating the consultation, I obtained informed verbal consent to perform this consultation using Telehealth tools and answered all the questions about the Telehealth interaction.    Ariel Braun  [id number]    Date of Service:   12/11/19      Ariel Braun is a 59 y.o. female with a history of myeloma, stable paraproteins on Rev/dex, who is being seen through a Telemed visit for follow-up and co-management with [facility].    For review the patient history is as follows:     IgG kappa myeloma, diagnosed 09/07/10     Significant bone involvement, 8% plasma cells, normal cyto 46XX, FISH showed only 3 copies 1q21, Stage 2 ISS (3.88 beta 2 microglobulin),IgG 4750, M-spike 3.2, kappa 586.9 mg/L.     RVD 10/2010 through April 2011 (? 4 cycles). Achieved VGPR with M-spike 0.4, IgG 886, kappa 27.6.     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     8 months post transplant, her M-spike continues to fall (0.2 on 11/26/11), she's doing great.     She has intermittent pain right hip, with x-ray showing old lesion. Would recommend PET/CT.     01/21/12: M-spike 0.3, kappa 16.5, IgG 810. PET/CT neg.     Swelling of feet and nausea (maybe HAE or zometa).     Then had HAE attacks x 2 with abdominal pain and vomiting.     Observation and no bisphos 2/2 her concerns.     08/11/12: Stable M-protein 0.2, IgG 729, kappa 12.3 in August and IgG 723.     09/29/12: MRI shoulder no myeloma. Kappa 17.     12/08/12: HAE attacks more frequent (weekly) but less severe. To receive C 1 esterase inhibitor (Berinert) prior to receiving pneumovax, TdAP, and hep B (12/11/12).     12/18/12. Slow rise in para-protein to M-spike 0.4     01/26/13: Did well with immunizations with Berinert pre-med. Hasn't used [facility] since.     03/14/13:  PET/CT no uptake. Meanwhile, M-spike of 0.4, IgG 937, kappa 18.5.     06/12/13: No HAE. M-spike 0.6, IgG 1270.     08/24/13: M-spike 0.6, IG 1330, kappa 32.3. Continue to observe.     03/29/14: MRI of hip no myeloma. M-spike 1.1.     06/11/14: PET normal.     08/05/14: Started Rev 15 mg 3/4. M-spike 1, IgG 1640, kappa 62.2     09/23/14: Seeing [facility]el Braun. Less pain in feet since Rev.M-spike 0.8, IgG 1532, kappa 62.     02/17/15: Kappa up a bit to 79.6. Proposed adding Ixazomib to Rev dex if she gets to kappa 100.     06/10/15: Kappa 76.9 Stable on Rev. Although ANC however at about 0.8 to 1.     09/16/15: Kappa 69.9 (down) but ANC 0.8. Will reduce Revlimid to 15 mg qod to allow ANC to rise a bit.     12/09/15: Feels much better on Rev 15 mg qod, but M-spike up to 1.1 and IgG 1660. Will add dex 8 mg weekly and ativan for anxiety and sleep.     04/06/16: Now on dex 4 mg two days in a row, Rev 15 mg q o d.   06/04/16: Kappa 30.1. Cont.  QOD Rev 42m. PET scan ordered and scheduled.     08/20/16: Feels fine. Myeloma labs stable with M-spike 1.1, IgG 1500, kappa 59. PET/CT neg from 7/28. Plan to continue Rev 15 mg qod, Dex 8 mg qwk.     12/14/16: Minimal left flank pain which is rare and intermittent. No hematuria. Myeloma labs better if anything with M-spike 0.9 and Kappa 44.2. Continue Rev 15 mg qod and dex 8 mg q week.     03/15/17: Feels well. Pain gone. IgG 1530. Continue Rev 15 mg qod and dex 8 mg weekly.     04/17/17: Will start dex in AM and Rev at night.     06/21/17: Stable M-spike 1.2, IgG 1480, kappa 58.9. Continue Rev 15 mg qod and dex 8 mg q wk.     09/16/17: M-spike stable 1.2. Kappa 52.6. Continue Rev 15 mg qod. Dex 8 mg q wk     09/16/17: Stable. M-spike 0.2, kappa 59. Continue Rev 15 mg qod and dex 8 mg q wk. Note: no HAE episodes in 2 years.     03/19/18: Multiple PEs. Placed on Eliquis. Hold Rev.     04/11/18: M-spike 1.4, kappa 85. Resume Rev at 15 mg daily (3/4), then qod, Continue dex 8 mg qwk. Continue Eliquis. Had  pneumonia as well as PE's in early May. Need to repeat CT chest due to a mass (3.4 cm x 1.6 cm) at left posterior sulcus.     05/10/18: Rev 15 mg qod.     05/17/18: Repeat CT () with improvement and no mass or continued PEs. Mild intermittent pain left posterior chest (not pleuritic). M-spike 1.3, IgG 1510, kappa 64 (stable).     08/08/18: Pain in L post chest better. CT scan > resolution of PEs.     12/12/18: KLC 78. Rev 15 mg qod, dex 8 mg (4/4).     04/17/19: M-1.4, KLC 96.3. ? PD. Up Rev to 15 mg qd (3/4) and dex 8 mg wkly(4/4).      07/20/19: L hip pain. M-1.2, KLC 96 (stable). Cont. Rev 15 mg (3/4)/dex 8 mg (4/4).     08/21/19: Migratory and transitory pains in legs. [id number], M-1.2.      12/11/19:  Continues to have brief pains L hip. Have ordered PET at [facility]. [facility]. Stable. PCD an option if she is progressing (vs local XRT).      Interval History: pain l hip    Past medical, family and social histories, as well as current medications and allergies, were reviewed and updated in the medical record as appropriate.         Review of Systems - Positive findings in bold  General: reviewed and is non-contributory to the illness/disease  Ophthalmic: reviewed and is non-contributory to the illness/disease  ENT: reviewed and is non-contributory to the illness/disease  Hematological and Lymphatic: reviewed and is non-contributory to the illness/disease  Respiratory: no cough, shortness of breath, or wheezing  Cardiovascular: no chest pain or dyspnea on exertion  Gastrointestinal: no abdominal pain, change in bowel habits, or black or bloody stools  Musculoskeletal: L hip pain. Otherwise, reviewed and is non-contributory to the illness/disease  Neurological: no TIA or stroke symptoms.  No PN.  Dermatological: reviewed and is non-contributory to the illness/disease  The remainder of the systems were reviewed and are non-contributory to the illness/disease      Lab Results   Component Value Date    WBC Count 2.8 (L)  11/26/2019  Hemoglobin 12.5 11/26/2019    Hematocrit 37.9 11/26/2019    MCV 92 11/26/2019    Platelet Count 187 11/26/2019     Lab Results   Component Value Date    Albumin, Serum / Plasma 4.0 11/26/2019      Lab Results   Component Value Date    Creatinine 0.76 11/26/2019    Creatinine Whole Blood 0.8 06/05/2014      Lab Results   Component Value Date    Sodium, Serum / Plasma 141 11/26/2019    Potassium, Serum / Plasma 3.3 (L) 11/26/2019    Chloride, Serum / Plasma 105 11/26/2019    Carbon Dioxide, Total 23 11/26/2019     No results found for: TPUR  Prot Electrophoresis   Date Value Ref Range Status   08/03/2019 Abnormal (A) Normal pattern. 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 Paraprotein spike = 1.2 g/dL.  Interpretation by Pathologist: Clelia Schaumann, M.D.     07/05/2019 Abnormal (A) Normal pattern. 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 Paraprotein spike = 1.2 g/dL.  Interpretation by Pathologist: Clelia Schaumann, M.D.     06/08/2019 Abnormal (A) Normal pattern. Final     Comment:     Hypoalbuminemia with a protein spike in the gamma region, consistent with a monoclonal gammopathy. Paraprotein spike = 1.1 g/dL.  Interpretation by Pathologist: Clelia Schaumann, M.D.       Results for orders placed or performed in visit on 11/09/19   Kappa and Lambda Free light Chains, Serum   Result Value Ref Range    Kappa Lt Chain, Free 88.2 (H) 3.3 - 19.4 mg/L    Lambda Lt Chain, Free 4.3 (L) 5.7 - 26.3 mg/L    Kappa/Lambda Ratio 20.51 (H) 0.26 - 1.65    Narrative    Performed at:  [facility]  [address], [place], [place]  [id number]  Lab Director: Orene Desanctis MD, Phone:  [phone]   Results for orders placed or performed in visit on 10/12/19   Kappa and Lambda Free light Chains, Serum   Result Value Ref Range    Kappa Lt Chain, Free 83.5 (H) 3.3 - 19.4 mg/L     Lambda Lt Chain, Free 4.7 (L) 5.7 - 26.3 mg/L    Kappa/Lambda Ratio 17.77 (H) 0.26 - 1.65    Narrative    Performed at:  [facility]  [address], [place], [place]  [id number]  Lab Director: Orene Desanctis MD, Phone:  [phone]   Results for orders placed or performed in visit on 09/14/19   Kappa and Lambda Free light Chains, Serum   Result Value Ref Range    Kappa Lt Chain, Free 90.1 (H) 3.3 - 19.4 mg/L    Lambda Lt Chain, Free 4.3 (L) 5.7 - 26.3 mg/L    Kappa/Lambda Ratio 20.95 (H) 0.26 - 1.65    Narrative    Performed at:  [facility]  [address] [place], [place], [place]  [id number]  Lab Director: Orene Desanctis MD, Phone:  [phone]     Lab Results   Component Value Date    IgA, serum 25 (L) 04/03/2019    IgA, serum 22 (L) 03/16/2018  IgA, serum 44 (L) 01/29/2016    IgG, serum 1,490 08/03/2019    IgG, serum 1,520 07/05/2019    IgG, serum 1,420 06/08/2019    IgM, serum 18 (L) 04/03/2019    IgM, serum 25 (L) 03/16/2018    IgM, serum 33 (L) 01/29/2016               ASSESSMENT and PLAN:    Response to therapy (if on therapy) is: SD       Problem 1) Myeloma (CMS code)    Continues to have brief pains L hip.     Have ordered PET at [facility] imaging.     [place] 88.     Stable.     PCD an option if she is progressing (vs local XRT).  :      Problem 2)      Problem 3)        2)  Managed Pain and other Symptoms: yes      3)  Labs that were personally reviewed and acted upon by adjusting dosage or frequency of therapy or changing therapy altogether: SPEP, Immunoglobulins, free light chains, creatinine, CBC    4)  Reviewed Imaging and/or Pathology with assistance of colleagues and used that information to manage the patient's cancer and his/her chemotherapy: Rev/dex    5)  Receiving High Risk Chemotherapy requiring frequent monitoring and reviewed for toxicities and dose adjustment: Rev/dex    6)  Cause for Immunosuppression and Management:     Myeloma and chemotherapy  including Rev/dex    7)  New problem acted upon during this visit: pain L hip    8)  Referrals: PET/CT in [facility]    9)  Plan for therapy:    Problem 1)  Continue Rev 15 mg (3/4)/dex 8 mg (4/4)    Problem 2)  COVID, wherever she can get it        Number and Complexity of  Problems Addressed during this visit:  High    Amount and/or Complexity of Data Reviewed and acted upon:   Ext    Risk of Complications and/or morbidity of management:  High    This patient has an illness that poses a threat to life or bodily function and is undergoing drug therapy requiring intensive and frequent monitoring for toxicity.

## 2019-12-13 NOTE — Assessment & Plan Note (Signed)
Continues to have brief pains L hip.     Have ordered PET at [facility] imaging.     [place] 88.     Stable.     PCD an option if she is progressing (vs local XRT).

## 2019-12-25 LAB — COMPREHENSIVE METABOLIC PANEL
AST: 21 IU/L (ref 0–40)
Alanine transaminase: 28 IU/L (ref 0–32)
Albumin, Serum / Plasma: 4 g/dL (ref 3.8–4.9)
Albumin/Globulin Ratio: 1.7 (ref 1.2–2.2)
Alkaline Phosphatase: 95 IU/L (ref 39–117)
BUN/Creatinine Ratio (External: 13 (ref 9–23)
Bilirubin, Total: 1.4 mg/dL — ABNORMAL HIGH (ref 0.0–1.2)
Calcium, total, Serum / Plasma: 9.1 mg/dL (ref 8.7–10.2)
Carbon Dioxide, Total: 25 mmol/L (ref 20–29)
Chloride, Serum / Plasma: 107 mmol/L — ABNORMAL HIGH (ref 96–106)
Creatinine: 0.75 mg/dL (ref 0.57–1.00)
Globulin, Total: 2.4 g/dL (ref 1.5–4.5)
Glucose, non-fasting: 98 mg/dL (ref 65–99)
Potassium, Serum / Plasma: 3.2 mmol/L — ABNORMAL LOW (ref 3.5–5.2)
Protein, Total, Serum / Plasma: 6.4 g/dL (ref 6.0–8.5)
Sodium, Serum / Plasma: 141 mmol/L (ref 134–144)
Urea Nitrogen, Serum / Plasma: 10 mg/dL (ref 6–24)
eGFR - high estimate: 101 mL/min/{1.73_m2} (ref >59)
eGFR - low estimate: 88 mL/min/{1.73_m2} (ref >59)

## 2019-12-25 LAB — COMPLETE BLOOD COUNT WITH DIFF
% Basophils: 2 %
% Eosinophils: 3 %
% Monocytes: 13 %
% Neutrophils: 38 %
Abs Basophils: 0.1 10*3/uL (ref 0.0–0.2)
Abs Eosinophils: 0.1 10*3/uL (ref 0.0–0.4)
Abs Lymphocytes: 1.3 10*3/uL (ref 0.7–3.1)
Abs Monocytes: 0.4 10*3/uL (ref 0.1–0.9)
Hematocrit: 38.4 % (ref 34.0–46.6)
Hemoglobin: 12.5 g/dL (ref 11.1–15.9)
Lymphs: 44 %
MCH: 30.2 pg (ref 26.6–33.0)
MCHC: 32.6 g/dL (ref 31.5–35.7)
MCV: 93 fL (ref 79–97)
Neutrophils Absolute: 1.2 10*3/uL — ABNORMAL LOW (ref 1.4–7.0)
Platelet Count: 201 10*3/uL (ref 150–450)
RBC Count: 4.14 x10E6/uL (ref 3.77–5.28)
RDW: 14.8 % (ref 11.7–15.4)
WBC Count: 3 10*3/uL — ABNORMAL LOW (ref 3.4–10.8)

## 2019-12-25 LAB — LACTATE DEHYDROGENASE, BLOOD: Lactate Dehydrogenase: 165 IU/L (ref 119–226)

## 2019-12-26 LAB — PROTEIN ELECTROPHORESIS, SERUM
A/G Ratio: 1 (ref 0.7–1.7)
Albumin, SPEP: 3.4 g/dL (ref 2.9–4.4)
Alpha 1 Globulin: 0.3 g/dL (ref 0.0–0.4)
Alpha 2 Globulin: 0.7 g/dL (ref 0.4–1.0)
Beta Globulin: 1.1 g/dL (ref 0.7–1.3)
Gamma Globulin: 1.3 g/dL (ref 0.4–1.8)
Globulin, Total: 3.4 g/dL (ref 2.2–3.9)
M-Spike, serum: 1.2 g/dL — ABNORMAL HIGH
Protein, Total, Serum / Plasma: 6.8 g/dL (ref 6.0–8.5)

## 2019-12-26 LAB — KAPPA AND LAMBDA FREE LIGHT CH
Kappa Lt Chain, Free: 106.5 mg/L — ABNORMAL HIGH (ref 3.3–19.4)
Kappa/Lambda Ratio: 26.63 — ABNORMAL HIGH (ref 0.26–1.65)
Lambda Lt Chain, Free: 4 mg/L — ABNORMAL LOW (ref 5.7–26.3)

## 2019-12-26 LAB — IGA, SERUM: IgA: 25 mg/dL — ABNORMAL LOW (ref 87–352)

## 2019-12-26 LAB — IGM, SERUM: IgM: 14 mg/dL — ABNORMAL LOW (ref 26–217)

## 2019-12-26 LAB — IGG, SERUM: IgG: 1402 mg/dL (ref 586–1602)

## 2020-01-01 ENCOUNTER — Ambulatory Visit: Admit: 2020-03-07 | Payer: PRIVATE HEALTH INSURANCE

## 2020-01-21 LAB — COMPREHENSIVE METABOLIC PANEL
AST: 20 IU/L (ref 0–40)
Alanine transaminase: 29 IU/L (ref 0–32)
Albumin, Serum / Plasma: 4 g/dL (ref 3.8–4.9)
Albumin/Globulin Ratio: 1.7 (ref 1.2–2.2)
Alkaline Phosphatase: 88 IU/L (ref 39–117)
BUN/Creatinine Ratio (External: 14 (ref 9–23)
Bilirubin, Total: 1.1 mg/dL (ref 0.0–1.2)
Calcium, total, Serum / Plasma: 9 mg/dL (ref 8.7–10.2)
Carbon Dioxide, Total: 20 mmol/L (ref 20–29)
Chloride, Serum / Plasma: 107 mmol/L — ABNORMAL HIGH (ref 96–106)
Creatinine: 0.81 mg/dL (ref 0.57–1.00)
Globulin, Total: 2.3 g/dL (ref 1.5–4.5)
Glucose, non-fasting: 97 mg/dL (ref 65–99)
Potassium, Serum / Plasma: 3.4 mmol/L — ABNORMAL LOW (ref 3.5–5.2)
Protein, Total, Serum / Plasma: 6.3 g/dL (ref 6.0–8.5)
Sodium, Serum / Plasma: 143 mmol/L (ref 134–144)
Urea Nitrogen, Serum / Plasma: 11 mg/dL (ref 6–24)
eGFR - high estimate: 92 mL/min/{1.73_m2} (ref >59)
eGFR - low estimate: 80 mL/min/{1.73_m2} (ref >59)

## 2020-01-21 LAB — COMPLETE BLOOD COUNT WITH DIFF
% Basophils: 1 %
% Eosinophils: 3 %
% Monocytes: 12 %
% Neutrophils: 40 %
Abs Basophils: 0 10*3/uL (ref 0.0–0.2)
Abs Eosinophils: 0.1 10*3/uL (ref 0.0–0.4)
Abs Lymphocytes: 1.3 10*3/uL (ref 0.7–3.1)
Abs Monocytes: 0.3 10*3/uL (ref 0.1–0.9)
Hematocrit: 38.1 % (ref 34.0–46.6)
Hemoglobin: 12.6 g/dL (ref 11.1–15.9)
Lymphs: 44 %
MCH: 29.8 pg (ref 26.6–33.0)
MCHC: 33.1 g/dL (ref 31.5–35.7)
MCV: 90 fL (ref 79–97)
Neutrophils Absolute: 1.1 10*3/uL — ABNORMAL LOW (ref 1.4–7.0)
Platelet Count: 191 10*3/uL (ref 150–450)
RBC Count: 4.23 x10E6/uL (ref 3.77–5.28)
RDW: 14.8 % (ref 11.7–15.4)
WBC Count: 2.8 10*3/uL — ABNORMAL LOW (ref 3.4–10.8)

## 2020-01-21 LAB — LACTATE DEHYDROGENASE, BLOOD: Lactate Dehydrogenase: 141 IU/L (ref 119–226)

## 2020-01-23 LAB — PROTEIN ELECTROPHORESIS, SERUM
A/G Ratio: 1 (ref 0.7–1.7)
Albumin, SPEP: 3.2 g/dL (ref 2.9–4.4)
Alpha 1 Globulin: 0.3 g/dL (ref 0.0–0.4)
Alpha 2 Globulin: 0.7 g/dL (ref 0.4–1.0)
Beta Globulin: 1 g/dL (ref 0.7–1.3)
Gamma Globulin: 1.2 g/dL (ref 0.4–1.8)
Globulin, Total: 3.2 g/dL (ref 2.2–3.9)
M-Spike, serum: 1.1 g/dL — ABNORMAL HIGH
Protein, Total, Serum / Plasma: 6.4 g/dL (ref 6.0–8.5)

## 2020-01-23 LAB — KAPPA AND LAMBDA FREE LIGHT CH
Kappa Lt Chain, Free: 73.3 mg/L — ABNORMAL HIGH (ref 3.3–19.4)
Kappa/Lambda Ratio: 23.65 — ABNORMAL HIGH (ref 0.26–1.65)
Lambda Lt Chain, Free: 3.1 mg/L — ABNORMAL LOW (ref 5.7–26.3)

## 2020-01-23 LAB — IGG, SERUM: IgG: 1346 mg/dL (ref 586–1602)

## 2020-01-23 LAB — IGA, SERUM: IgA: 23 mg/dL — ABNORMAL LOW (ref 87–352)

## 2020-01-23 LAB — IGM, SERUM: IgM: 13 mg/dL — ABNORMAL LOW (ref 26–217)

## 2020-02-12 ENCOUNTER — Ambulatory Visit: Admit: 2020-02-15 | Payer: PRIVATE HEALTH INSURANCE

## 2020-02-12 DIAGNOSIS — C9000 Multiple myeloma not having achieved remission: Secondary | ICD-10-CM

## 2020-02-12 DIAGNOSIS — D841 Defects in the complement system: Secondary | ICD-10-CM

## 2020-02-12 MED ORDER — CHOLESTYRAMINE LIGHT 4 GRAM POWDER FOR SUSP IN A PACKET
4 | PACK | Freq: Two times a day (BID) | ORAL | 11 refills | Status: DC
Start: 2020-02-12 — End: 2020-03-12

## 2020-02-12 NOTE — Progress Notes (Signed)
I performed this consultation using real-time Telehealth tools, including a live video connection between my location and the patient's location which was in [place].     Prior to initiating the consultation, I obtained informed verbal consent to perform this consultation using Telehealth tools and answered all the questions about the Telehealth interaction.    MONIK LINS  [id number]    Date of Service:   02/12/20      Ariel Braun is a 59 y.o. female with a history of myeloma, has been on Rev/dex for 6 yrs, but might be progressing now,  who is being seen through a Telemed visit for follow-up and co-management with Dr. Iona Coach.    For review the patient history is as follows:     IgG kappa myeloma, diagnosed 09/07/10     Significant bone involvement, 8% plasma cells, normal cyto 46XX, FISH showed only 3 copies 1q21, Stage 2 ISS (3.88 beta 2 microglobulin),IgG 4750, M-spike 3.2, kappa 586.9 mg/L.     RVD 10/2010 through April 2011 (? 4 cycles). Achieved VGPR with M-spike 0.4, IgG 886, kappa 27.6.     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     8 months post transplant, her M-spike continues to fall (0.2 on 11/26/11), she's doing great.     She has intermittent pain right hip, with x-ray showing old lesion. Would recommend PET/CT.     01/21/12: M-spike 0.3, kappa 16.5, IgG 810. PET/CT neg.     Swelling of feet and nausea (maybe HAE or zometa).     Then had HAE attacks x 2 with abdominal pain and vomiting.     Observation and no bisphos 2/2 her concerns.     08/11/12: Stable M-protein 0.2, IgG 729, kappa 12.3 in August and IgG 723.     09/29/12: MRI shoulder no myeloma. Kappa 17.     12/08/12: HAE attacks more frequent (weekly) but less severe. To receive C 1 esterase inhibitor (Berinert) prior to receiving pneumovax, TdAP, and hep B (12/11/12).     12/18/12. Slow rise in para-protein to M-spike 0.4     01/26/13: Did well with immunizations with Berinert pre-med. Hasn't  used [employer] since.     03/14/13: PET/CT no uptake. Meanwhile, M-spike of 0.4, IgG 937, kappa 18.5.     06/12/13: No HAE. M-spike 0.6, IgG 1270.     08/24/13: M-spike 0.6, IG 1330, kappa 32.3. Continue to observe.     03/29/14: MRI of hip no myeloma. M-spike 1.1.     06/11/14: PET normal.     08/05/14: Started Rev 15 mg 3/4. M-spike 1, IgG 1640, kappa 62.2     09/23/14: Seeing [name]. Less pain in feet since Rev.M-spike 0.8, IgG 1532, kappa 62.     02/17/15: Kappa up a bit to 79.6. Proposed adding Ixazomib to Rev dex if she gets to kappa 100.     06/10/15: Kappa 76.9 Stable on Rev. Although ANC however at about 0.8 to 1.     09/16/15: Kappa 69.9 (down) but ANC 0.8. Will reduce Revlimid to 15 mg qod to allow ANC to rise a bit.     12/09/15: Feels much better on Rev 15 mg qod, but M-spike up to 1.1 and IgG 1660. Will add dex 8 mg weekly and ativan for anxiety and sleep.     04/06/16: Now on dex 4 mg two days in a row, Rev 15 mg  q o d.   06/04/16: Kappa 30.1. Cont. QOD Rev 88m. PET scan ordered and scheduled.     08/20/16: Feels fine. Myeloma labs stable with M-spike 1.1, IgG 1500, kappa 59. PET/CT neg from 7/28. Plan to continue Rev 15 mg qod, Dex 8 mg qwk.     12/14/16: Minimal left flank pain which is rare and intermittent. No hematuria. Myeloma labs better if anything with M-spike 0.9 and Kappa 44.2. Continue Rev 15 mg qod and dex 8 mg q week.     03/15/17: Pain gone. IgG 1530. Cont Rev 15 mg qod and dex 8 mg weekly.     06/21/17: Stable M-1.2, IgG 1480, KLC 58.9. Cont Rev 15 mg qod and dex 8 mg q wk.     09/16/17: M-1.2. KLC 52.6. Continue Rev 15 mg qod. Dex 8 mg q wk     09/16/17: Stable. M-0.2, kappa 59. Continue Rev 15 mg qod and dex 8 mg q wk. Note: no HAE episodes in 2 years.     03/19/18: Multiple PEs. On Eliquis. Hold Rev.     04/11/18: M-1.4, [id number] Resume Rev at 15 mg daily (3/4), then qod, Cont dex 8 mg qwk. Cont Eliquis. Had pneumonia as well as PE's in early May. Need to repeat CT chest due to a mass (3.4 cm x 1.6 cm)  at left posterior sulcus.     05/10/18: Rev 15 mg qod.     05/17/18: Repeat CT ([place]) with improvement and no mass or continued PEs. Mild intermittent pain left posterior chest (not pleuritic). M-1.3, IgG 1510, KLC 64 (stable).     08/08/18: Chest pain better. CT scan > resolution of PEs.     12/12/18: KLC 78. Rev 15 mg qod, dex 8 mg (4/4).     04/17/19: M-1.4, KLC 96.3. ? PD. Up Rev to 15 mg qd (3/4) and dex 8 mg wkly(4/4).      07/20/19: L hip pain. M-1.2, KLC 96. Cont. Rev 15 mg (3/4)/dex 8 mg (4/4).     08/21/19:  K[place]101, M-1.2.      12/11/19: Brief pains L hip. K[place]88. Stable. PCD vs local XRT.     01/01/20: PET > sclerotic lesion R ilium/mid right SI jt with SUV 4.4. Needs further eval by JJ.     02/12/20: L hip pain better. KLC 73 (stable). Although KLC stable, will review PET ourselves and consider switch to PCD.      Interval History: no pain    Past medical, family and social histories, as well as current medications and allergies, were reviewed and updated in the medical record as appropriate.         Review of Systems - Positive findings in bold  General: reviewed and is non-contributory to the illness/disease  Ophthalmic: reviewed and is non-contributory to the illness/disease  ENT: reviewed and is non-contributory to the illness/disease  Hematological and Lymphatic: reviewed and is non-contributory to the illness/disease  Respiratory: no cough, shortness of breath, or wheezing  Cardiovascular: no chest pain or dyspnea on exertion  Gastrointestinal: no abdominal pain, change in bowel habits, or black or bloody stools  Musculoskeletal:   No bone pain. Otherwise, reviewed and is non-contributory to the illness/disease  Neurological: no TIA or stroke symptoms.  No PN.  Dermatological: reviewed and is non-contributory to the illness/disease  The remainder of the systems were reviewed and are non-contributory to the illness/disease      Lab Results   Component Value Date  WBC Count 2.8 (L) 01/21/2020     Hemoglobin 12.6 01/21/2020    Hematocrit 38.1 01/21/2020    MCV 90 01/21/2020    Platelet Count 191 01/21/2020     Lab Results   Component Value Date    Albumin, Serum / Plasma 4.0 01/21/2020      Lab Results   Component Value Date    Creatinine 0.81 01/21/2020    Creatinine Whole Blood 0.8 06/05/2014      Lab Results   Component Value Date    Sodium, Serum / Plasma 143 01/21/2020    Potassium, Serum / Plasma 3.4 (L) 01/21/2020    Chloride, Serum / Plasma 107 (H) 01/21/2020    Carbon Dioxide, Total 20 01/21/2020     No results found for: TPUR  Prot Electrophoresis   Date Value Ref Range Status   08/03/2019 Abnormal (A) Normal pattern. 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 Paraprotein spike = 1.2 g/dL.  Interpretation by Pathologist: Clelia Schaumann, M.D.     07/05/2019 Abnormal (A) Normal pattern. 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 Paraprotein spike = 1.2 g/dL.  Interpretation by Pathologist: Clelia Schaumann, M.D.     06/08/2019 Abnormal (A) Normal pattern. Final     Comment:     Hypoalbuminemia with a protein spike in the gamma region, consistent with a monoclonal gammopathy. Paraprotein spike = 1.1 g/dL.  Interpretation by Pathologist: Clelia Schaumann, M.D.       Results for orders placed or performed in visit on 01/04/20   Kappa and Lambda Free light Chains, Serum   Result Value Ref Range    Kappa Lt Chain, Free 73.3 (H) 3.3 - 19.4 mg/L    Lambda Lt Chain, Free 3.1 (L) 5.7 - 26.3 mg/L    Kappa/Lambda Ratio 23.65 (H) 0.26 - 1.65    Narrative    Performed at:  [facility] - [facility]  [address]  [place], [place], [place]  [id number]  Lab Director: Orene Desanctis MD, Phone:  [phone]   Results for orders placed or performed in visit on 12/07/19   Kappa and Lambda Free light Chains, Serum   Result Value Ref Range    Kappa Lt Chain, Free 106.5 (H) 3.3 - 19.4 mg/L    Lambda Lt  Chain, Free 4.0 (L) 5.7 - 26.3 mg/L    Kappa/Lambda Ratio 26.63 (H) 0.26 - 1.65    Narrative    Performed at:  [facility]  [address]  [address] [place], [place], [place]  [id number]  Lab Director: Orene Desanctis MD, Phone:  [phone]   Results for orders placed or performed in visit on 11/09/19   Kappa and Lambda Free light Chains, Serum   Result Value Ref Range    Kappa Lt Chain, Free 88.2 (H) 3.3 - 19.4 mg/L    Lambda Lt Chain, Free 4.3 (L) 5.7 - 26.3 mg/L    Kappa/Lambda Ratio 20.51 (H) 0.26 - 1.65    Narrative    Performed at:  [facility]  [address]  [address] [place], [place], [place]  [id number]  Lab Director: Orene Desanctis MD, Phone:  [phone]     Lab Results   Component Value Date    IgA, serum 25 (  L) 04/03/2019    IgA, serum 22 (L) 03/16/2018    IgA, serum 44 (L) 01/29/2016    IgG, serum 1,490 08/03/2019    IgG, serum 1,520 07/05/2019    IgG, serum 1,420 06/08/2019    IgM, serum 18 (L) 04/03/2019    IgM, serum 25 (L) 03/16/2018    IgM, serum 33 (L) 01/29/2016               ASSESSMENT and PLAN:    Response to therapy (if on therapy) is: appears to be SD by [facility]       Problem 1) Myeloma (CMS code)    L hip pain better.     KLC 73 (stable).     Although KLC stable, will review PET and if suggestive of myeloma lesion, will consider switch to PCD.     Problem 2) Bone Health    Calcium/Vit D      Problem 3) Immunocompromised        2)  Managed Pain and other Symptoms: no pain now      3)  Labs that were personally reviewed and acted upon by adjusting dosage or frequency of therapy or changing therapy altogether: SPEP, Immunoglobulins, free light chains, creatinine, CBC    4)  Reviewed Imaging and/or Pathology with assistance of colleagues and used that information to manage the patient's cancer and his/her chemotherapy: will review PET/CT here to determine if she needs change in therapy    5)  Receiving High Risk Chemotherapy requiring frequent monitoring and reviewed for toxicities  and dose adjustment: Rev/dex    6)  Cause for Immunosuppression and Management:     Myeloma and chemotherapy including Rev/dex    7)  New problem acted upon during this visit: possible new bone lesion indicating need to change therapy             Number and Complexity of  Problems Addressed during this visit:  High    Amount and/or Complexity of Data Reviewed and acted upon:   Ext    Risk of Complications and/or morbidity of management:  High    This patient has an illness that poses a threat to life or bodily function and is / or has been or will be undergoing drug therapy requiring intensive and frequent monitoring for toxicity.

## 2020-02-15 NOTE — Assessment & Plan Note (Signed)
L hip pain better.     KLC 73 (stable).     Although KLC stable, will consider further work-up and close monitoring.    Consider switch to PCD.

## 2020-02-19 LAB — COMPLETE BLOOD COUNT WITH DIFF
% Basophils: 1 %
% Eosinophils: 3 %
% Monocytes: 14 %
% Neutrophils: 37 %
Abs Basophils: 0 10*3/uL (ref 0.0–0.2)
Abs Eosinophils: 0.1 10*3/uL (ref 0.0–0.4)
Abs Lymphocytes: 1.4 10*3/uL (ref 0.7–3.1)
Abs Monocytes: 0.4 10*3/uL (ref 0.1–0.9)
Hematocrit: 36 % (ref 34.0–46.6)
Hemoglobin: 11.7 g/dL (ref 11.1–15.9)
Lymphs: 45 %
MCH: 29.5 pg (ref 26.6–33.0)
MCHC: 32.5 g/dL (ref 31.5–35.7)
MCV: 91 fL (ref 79–97)
Neutrophils Absolute: 1.1 10*3/uL — ABNORMAL LOW (ref 1.4–7.0)
Platelet Count: 184 10*3/uL (ref 150–450)
RBC Count: 3.96 x10E6/uL (ref 3.77–5.28)
RDW: 14.9 % (ref 11.7–15.4)
WBC Count: 3 10*3/uL — ABNORMAL LOW (ref 3.4–10.8)

## 2020-02-19 LAB — COMPREHENSIVE METABOLIC PANEL
AST: 19 IU/L (ref 0–40)
Alanine transaminase: 25 IU/L (ref 0–32)
Albumin, Serum / Plasma: 3.9 g/dL (ref 3.8–4.9)
Albumin/Globulin Ratio: 1.6 (ref 1.2–2.2)
Alkaline Phosphatase: 85 IU/L (ref 39–117)
BUN/Creatinine Ratio (External: 11 — ABNORMAL LOW (ref 12–28)
Bilirubin, Total: 1.2 mg/dL (ref 0.0–1.2)
Calcium, total, Serum / Plasma: 9.2 mg/dL (ref 8.7–10.3)
Carbon Dioxide, Total: 26 mmol/L (ref 20–29)
Chloride, Serum / Plasma: 107 mmol/L — ABNORMAL HIGH (ref 96–106)
Creatinine: 0.72 mg/dL (ref 0.57–1.00)
Globulin, Total: 2.4 g/dL (ref 1.5–4.5)
Glucose, non-fasting: 92 mg/dL (ref 65–99)
Potassium, Serum / Plasma: 3.6 mmol/L (ref 3.5–5.2)
Protein, Total, Serum / Plasma: 6.3 g/dL (ref 6.0–8.5)
Sodium, Serum / Plasma: 141 mmol/L (ref 134–144)
Urea Nitrogen, Serum / Plasma: 8 mg/dL (ref 8–27)
eGFR - high estimate: 105 mL/min/{1.73_m2} (ref >59)
eGFR - low estimate: 91 mL/min/{1.73_m2} (ref >59)

## 2020-02-20 LAB — PROTEIN ELECTROPHORESIS, SERUM
A/G Ratio: 1 (ref 0.7–1.7)
Albumin, SPEP: 3.3 g/dL (ref 2.9–4.4)
Alpha 1 Globulin: 0.3 g/dL (ref 0.0–0.4)
Alpha 2 Globulin: 0.7 g/dL (ref 0.4–1.0)
Beta Globulin: 1 g/dL (ref 0.7–1.3)
Gamma Globulin: 1.2 g/dL (ref 0.4–1.8)
Globulin, Total: 3.2 g/dL (ref 2.2–3.9)
M-Spike, serum: 1.1 g/dL — ABNORMAL HIGH
Protein, Total, Serum / Plasma: 6.5 g/dL (ref 6.0–8.5)

## 2020-02-20 LAB — IGA, SERUM: IgA: 23 mg/dL — ABNORMAL LOW (ref 87–352)

## 2020-02-20 LAB — KAPPA AND LAMBDA FREE LIGHT CH
Kappa Lt Chain, Free: 109 mg/L — ABNORMAL HIGH (ref 3.3–19.4)
Kappa/Lambda Ratio: 27.95 — ABNORMAL HIGH (ref 0.26–1.65)
Lambda Lt Chain, Free: 3.9 mg/L — ABNORMAL LOW (ref 5.7–26.3)

## 2020-02-20 LAB — IGM, SERUM: IgM: 13 mg/dL — ABNORMAL LOW (ref 26–217)

## 2020-02-20 LAB — LACTATE DEHYDROGENASE, BLOOD: Lactate Dehydrogenase: 152 IU/L (ref 119–226)

## 2020-02-20 LAB — IGG, SERUM: IgG: 1409 mg/dL (ref 586–1602)

## 2020-03-07 MED ORDER — ACYCLOVIR 400 MG TABLET
400 | ORAL | 1 refills | Status: DC
Start: 2020-03-07 — End: 2020-10-31

## 2020-03-12 MED ORDER — CHOLESTYRAMINE LIGHT 4 GRAM POWDER FOR SUSP IN A PACKET
4 gram | PACK | Freq: Two times a day (BID) | ORAL | 3 refills | Status: AC
Start: 2020-03-12 — End: 2022-01-09

## 2020-03-17 ENCOUNTER — Ambulatory Visit: Admit: 2020-03-19 | Payer: PRIVATE HEALTH INSURANCE

## 2020-03-17 DIAGNOSIS — C9000 Multiple myeloma not having achieved remission: Secondary | ICD-10-CM

## 2020-03-17 LAB — COMPLETE BLOOD COUNT WITH DIFF
% Basophils: 1 %
% Eosinophils: 2 %
% Monocytes: 14 %
% Neutrophils: 38 %
Abs Basophils: 0 10*3/uL (ref 0.0–0.2)
Abs Eosinophils: 0.1 10*3/uL (ref 0.0–0.4)
Abs Lymphocytes: 1.3 10*3/uL (ref 0.7–3.1)
Abs Monocytes: 0.4 10*3/uL (ref 0.1–0.9)
Hematocrit: 38.1 % (ref 34.0–46.6)
Hemoglobin: 12.5 g/dL (ref 11.1–15.9)
Lymphs: 45 %
MCH: 30 pg (ref 26.6–33.0)
MCHC: 32.8 g/dL (ref 31.5–35.7)
MCV: 91 fL (ref 79–97)
Neutrophils Absolute: 1.1 10*3/uL — ABNORMAL LOW (ref 1.4–7.0)
Platelet Count: 199 10*3/uL (ref 150–450)
RBC Count: 4.17 x10E6/uL (ref 3.77–5.28)
RDW: 14.9 % (ref 11.7–15.4)
WBC Count: 2.9 10*3/uL — ABNORMAL LOW (ref 3.4–10.8)

## 2020-03-17 LAB — COMPREHENSIVE METABOLIC PANEL
AST: 22 IU/L (ref 0–40)
Alanine transaminase: 34 IU/L — ABNORMAL HIGH (ref 0–32)
Albumin, Serum / Plasma: 4 g/dL (ref 3.8–4.9)
Albumin/Globulin Ratio: 1.6 (ref 1.2–2.2)
Alkaline Phosphatase: 92 IU/L (ref 39–117)
BUN/Creatinine Ratio (External: 15 (ref 12–28)
Bilirubin, Total: 1.3 mg/dL — ABNORMAL HIGH (ref 0.0–1.2)
Calcium, total, Serum / Plasma: 9.2 mg/dL (ref 8.7–10.3)
Carbon Dioxide, Total: 26 mmol/L (ref 20–29)
Chloride, Serum / Plasma: 108 mmol/L — ABNORMAL HIGH (ref 96–106)
Creatinine: 0.75 mg/dL (ref 0.57–1.00)
Globulin, Total: 2.5 g/dL (ref 1.5–4.5)
Glucose, non-fasting: 94 mg/dL (ref 65–99)
Potassium, Serum / Plasma: 3.3 mmol/L — ABNORMAL LOW (ref 3.5–5.2)
Protein, Total, Serum / Plasma: 6.5 g/dL (ref 6.0–8.5)
Sodium, Serum / Plasma: 143 mmol/L (ref 134–144)
Urea Nitrogen, Serum / Plasma: 11 mg/dL (ref 8–27)
eGFR - high estimate: 100 mL/min/{1.73_m2} (ref >59)
eGFR - low estimate: 87 mL/min/{1.73_m2} (ref >59)

## 2020-03-17 LAB — LACTATE DEHYDROGENASE, BLOOD: Lactate Dehydrogenase: 177 IU/L (ref 119–226)

## 2020-03-18 LAB — PROTEIN ELECTROPHORESIS, SERUM
A/G Ratio: 1.2 (ref 0.7–1.7)
Albumin, SPEP: 3.6 g/dL (ref 2.9–4.4)
Alpha 1 Globulin: 0.2 g/dL (ref 0.0–0.4)
Alpha 2 Globulin: 0.5 g/dL (ref 0.4–1.0)
Beta Globulin: 0.9 g/dL (ref 0.7–1.3)
Gamma Globulin: 1.2 g/dL (ref 0.4–1.8)
Globulin, Total: 2.9 g/dL (ref 2.2–3.9)
M-Spike, serum: 1.1 g/dL — ABNORMAL HIGH
Protein, Total, Serum / Plasma: 6.5 g/dL (ref 6.0–8.5)

## 2020-03-18 LAB — IGM, SERUM: IgM: 13 mg/dL — ABNORMAL LOW (ref 26–217)

## 2020-03-18 LAB — KAPPA AND LAMBDA FREE LIGHT CH
Kappa Lt Chain, Free: 121.7 mg/L — ABNORMAL HIGH (ref 3.3–19.4)
Kappa/Lambda Ratio: 29.68 — ABNORMAL HIGH (ref 0.26–1.65)
Lambda Lt Chain, Free: 4.1 mg/L — ABNORMAL LOW (ref 5.7–26.3)

## 2020-03-18 LAB — IGA, SERUM: IgA: 23 mg/dL — ABNORMAL LOW (ref 87–352)

## 2020-03-18 LAB — IGG, SERUM: IgG: 1449 mg/dL (ref 586–1602)

## 2020-04-08 ENCOUNTER — Ambulatory Visit: Admit: 2020-04-11 | Payer: PRIVATE HEALTH INSURANCE

## 2020-04-08 DIAGNOSIS — D841 Defects in the complement system: Secondary | ICD-10-CM

## 2020-04-08 DIAGNOSIS — C9002 Multiple myeloma in relapse: Secondary | ICD-10-CM

## 2020-04-08 NOTE — Progress Notes (Signed)
I performed this consultation using real-time Telehealth tools, including a live video connection between my location and the patient's location which was in [place].     Prior to initiating the consultation, I obtained informed verbal consent to perform this consultation using Telehealth tools and answered all the questions about the Telehealth interaction.    Ariel Braun  [id number]    Date of Service:   04/08/20      Ariel Braun is a 59 y.o. female with a history of myeloma, progressing on PET an by [facility], who is being seen through a Telemed visit for follow-up and co-management with Dr. Iona Coach.    For review the patient history is as follows:     IgG kappa myeloma, diagnosed 09/07/10     Significant bone involvement, 8% plasma cells, normal cyto 46XX, FISH showed only 3 copies 1q21, Stage 2 ISS (3.88 beta 2 microglobulin),IgG 4750, M-spike 3.2, kappa 586.9 mg/L.     RVD 10/2010 through April 2011 (? 4 cycles). Achieved VGPR with M-spike 0.4, IgG 886, kappa 27.6.     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     8 months post transplant, her M-spike continues to fall (0.2 on 11/26/11), she's doing great.     She has intermittent pain right hip, with x-ray showing old lesion. Would recommend PET/CT.     01/21/12: M-spike 0.3, kappa 16.5, IgG 810. PET/CT neg.     Swelling of feet and nausea (maybe HAE or zometa).     Then had HAE attacks x 2 with abdominal pain and vomiting.     Observation and no bisphos 2/2 her concerns.     08/11/12: Stable M-protein 0.2, IgG 729, kappa 12.3 in August and IgG 723.     09/29/12: MRI shoulder no myeloma. Kappa 17.     12/08/12: HAE attacks more frequent (weekly) but less severe. To receive C 1 esterase inhibitor (Berinert) prior to receiving pneumovax, TdAP, and hep B (12/11/12).     12/18/12. Slow rise in para-protein to M-spike 0.4     01/26/13: Did well with immunizations with Berinert pre-med. Hasn't used [employer] since.     03/14/13:  PET/CT no uptake. Meanwhile, M-spike of 0.4, IgG 937, kappa 18.5.     06/12/13: No HAE. M-spike 0.6, IgG 1270.     08/24/13: M-spike 0.6, IG 1330, kappa 32.3. Continue to observe.     03/29/14: MRI of hip no myeloma. M-spike 1.1.     06/11/14: PET normal.     08/05/14: Started Rev 15 mg 3/4. M-spike 1, IgG 1640, kappa 62.2     09/23/14: Seeing [name]. Less pain in feet since Rev.M-spike 0.8, IgG 1532, kappa 62.     02/17/15: Kappa up a bit to 79.6. Proposed adding Ixazomib to Rev dex if she gets to kappa 100.     06/10/15: Kappa 76.9 Stable on Rev. Although ANC however at about 0.8 to 1.     09/16/15: Kappa 69.9 (down) but ANC 0.8. Will reduce Revlimid to 15 mg qod to allow ANC to rise a bit.     12/09/15: Feels much better on Rev 15 mg qod, but M-spike up to 1.1 and IgG 1660. Will add dex 8 mg weekly and ativan for anxiety and sleep.     04/06/16: Now on dex 4 mg two days in a row, Rev 15 mg q o d.   06/04/16: Nelta Numbers  30.1. Cont. QOD Rev 49m. PET scan ordered and scheduled.     08/20/16: Feels fine. Myeloma labs stable with M-spike 1.1, IgG 1500, kappa 59. PET/CT neg from 7/28. Plan to continue Rev 15 mg qod, Dex 8 mg qwk.     12/14/16: Minimal left flank pain which is rare and intermittent. No hematuria. Myeloma labs better if anything with M-spike 0.9 and Kappa 44.2. Continue Rev 15 mg qod and dex 8 mg q week.     03/15/17: Pain gone. IgG 1530. Cont Rev 15 mg qod and dex 8 mg weekly.     06/21/17: Stable M-1.2, IgG 1480, KLC 58.9. Cont Rev 15 mg qod and dex 8 mg q wk.     09/16/17: M-1.2. KLC 52.6. Continue Rev 15 mg qod. Dex 8 mg q wk     09/16/17: Stable. M-0.2, kappa 59. Continue Rev 15 mg qod and dex 8 mg q wk. Note: no HAE episodes in 2 years.     03/19/18: Multiple PEs. On Eliquis. Hold Rev.     04/11/18: M-1.4, KIOE70 Resume Rev at 15 mg daily (3/4), then qod, Cont dex 8 mg qwk. Cont Eliquis. Pneumonia as well as PE's in early May. Need to repeat CT chest due to a mass (3.4 cm x 1.6 cm) at left posterior sulcus.      05/10/18: Rev 15 mg qod.     05/17/18: Repeat CT ([place]) with improvement and no mass or continued PEs. Mild intermittent pain left posterior chest (not pleuritic). M-1.3, IgG 1510, KLC 64 (stable).     08/08/18: Chest pain better. CT scan > resolution of PEs.     12/12/18: KLC 78. Rev 15 mg qod, dex 8 mg (4/4).     04/17/19: M-1.4, KLC 96.3. ? PD. Up Rev to 15 mg qd (3/4) and dex 8 mg wkly(4/4).      07/20/19: L hip pain. M-1.2, KLC 96. Cont. Rev 15 mg (3/4)/dex 8 mg (4/4).     08/21/19:  [place]101, M-1.2.      12/11/19: Brief pains L hip. K[place]88. Stable. PCD vs local XRT.     01/01/20: PET > sclerotic lesion R ilium/mid right SI jt with SUV 4.4.      02/12/20: L hip pain better. KLC 73.     02/19/20: KLC 109 (PD).     04/08/20: [place]. [place]122. Given rise in [place] progression in R ilium will switch to Pom 3,   CTX 300, Dex 8 (3/4).  Communicated with Dr. JIona Coach      Interval History: no new sx    Past medical, family and social histories, as well as current medications and allergies, were reviewed and updated in the medical record as appropriate.         Review of Systems - Positive findings in bold  General: reviewed and is non-contributory to the illness/disease  Ophthalmic: reviewed and is non-contributory to the illness/disease  ENT: reviewed and is non-contributory to the illness/disease  Hematological and Lymphatic: reviewed and is non-contributory to the illness/disease  Respiratory: no cough, shortness of breath, or wheezing  Cardiovascular: no chest pain or dyspnea on exertion  Gastrointestinal: no abdominal pain, change in bowel habits, or black or bloody stools  Musculoskeletal:   No bone pain. Otherwise, reviewed and is non-contributory to the illness/disease  Neurological: no TIA or stroke symptoms.  No PN.  Dermatological: reviewed and is non-contributory to the illness/disease  The remainder of the systems were reviewed and are non-contributory  to the illness/disease      Lab Results   Component Value Date    WBC  Count 2.9 (L) 03/17/2020    Hemoglobin 12.5 03/17/2020    Hematocrit 38.1 03/17/2020    MCV 91 03/17/2020    Platelet Count 199 03/17/2020     Lab Results   Component Value Date    Albumin, Serum / Plasma 4.0 03/17/2020      Lab Results   Component Value Date    Creatinine 0.75 03/17/2020    Creatinine Whole Blood 0.8 06/05/2014      Lab Results   Component Value Date    Sodium, Serum / Plasma 143 03/17/2020    Potassium, Serum / Plasma 3.3 (L) 03/17/2020    Chloride, Serum / Plasma 108 (H) 03/17/2020    Carbon Dioxide, Total 26 03/17/2020     No results found for: TPUR  Prot Electrophoresis   Date Value Ref Range Status   08/03/2019 Abnormal (A) Normal pattern. 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 Paraprotein spike = 1.2 g/dL.  Interpretation by Pathologist: Clelia Schaumann, M.D.     07/05/2019 Abnormal (A) Normal pattern. 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 Paraprotein spike = 1.2 g/dL.  Interpretation by Pathologist: Clelia Schaumann, M.D.     06/08/2019 Abnormal (A) Normal pattern. Final     Comment:     Hypoalbuminemia with a protein spike in the gamma region, consistent with a monoclonal gammopathy. Paraprotein spike = 1.1 g/dL.  Interpretation by Pathologist: Clelia Schaumann, M.D.       Results for orders placed or performed in visit on 02/29/20   Kappa and Lambda Free light Chains, Serum   Result Value Ref Range    Kappa Lt Chain, Free 121.7 (H) 3.3 - 19.4 mg/L    Lambda Lt Chain, Free 4.1 (L) 5.7 - 26.3 mg/L    Kappa/Lambda Ratio 29.68 (H) 0.26 - 1.65    Narrative    Performed at:  [facility]  [address]  [place], [place], [place]  [id number]  Lab Director: Orene Desanctis MD, Phone:  [phone]   Results for orders placed or performed in visit on 02/12/20   Kappa and Lambda Free light Chains, Serum   Result Value Ref Range    Kappa Lt Chain, Free 109.0 (H)  3.3 - 19.4 mg/L    Lambda Lt Chain, Free 3.9 (L) 5.7 - 26.3 mg/L    Kappa/Lambda Ratio 27.95 (H) 0.26 - 1.65    Narrative    Performed at:  [facility]  [address]  [address] [place], [place], [place]  [id number]  Lab Director: Orene Desanctis MD, Phone:  [phone]   Results for orders placed or performed in visit on 01/04/20   Kappa and Lambda Free light Chains, Serum   Result Value Ref Range    Kappa Lt Chain, Free 73.3 (H) 3.3 - 19.4 mg/L    Lambda Lt Chain, Free 3.1 (L) 5.7 - 26.3 mg/L    Kappa/Lambda Ratio 23.65 (H) 0.26 - 1.65    Narrative    Performed at:  [facility]  [address]  [address] [place], [place], [place]  [id number]  Lab Director: Orene Desanctis MD, Phone:  [phone]     Lab Results   Component Value Date    IgA, serum 25 (L) 04/03/2019    IgA, serum 22 (L) 03/16/2018    IgA, serum 44 (L) 01/29/2016    IgG, serum 1,490 08/03/2019    IgG, serum 1,520 07/05/2019    IgG, serum 1,420 06/08/2019    IgM, serum 18 (L) 04/03/2019    IgM, serum 25 (L) 03/16/2018    IgM, serum 33 (L) 01/29/2016               ASSESSMENT and PLAN:    Response to therapy (if on therapy) is: PD       Problem 1) Myeloma (CMS code)    [place] 122.     Given rise in [place] and progression in R ilium will switch to Pom 3,   CTX 300, Dex 8 (3/4).     Communicated with Dr. Iona Coach.         Problem 2) Bone Health    Calcium/Vit D      Problem 3) Immunocompromised    Should have antibody to spike protein measured and we can order    ____________________________________________________________________________________________________________________________________________    Miscellaneous management issues ;      1)  Managed Pain and other Symptoms: nothing new      2)  Labs that were personally reviewed and acted upon by adjusting dosage or frequency of therapy or changing therapy altogether: SPEP, Immunoglobulins, free light chains, creatinine, CBC    3)  Reviewed Imaging and/or Pathology with assistance of colleagues  and used that information to manage the patient's cancer and his/her chemotherapy: PET CT    4)  Receiving High Risk Chemotherapy requiring frequent monitoring and reviewed for toxicities and dose adjustment: switch to PCD    5)  Cause for Immunosuppression and Management:     Myeloma and chemotherapy including PCD    6)  New problem acted upon during this visit: PD         This patient has an illness that poses a threat to life or bodily function and is / or has been or will be undergoing drug therapy requiring intensive and frequent monitoring for toxicity.         Number and Complexity of  Problems Addressed during this visit:  High    Amount and/or Complexity of Data Reviewed and acted upon:   Ext    Risk of Complications and/or morbidity of management:  High

## 2020-04-10 NOTE — Assessment & Plan Note (Signed)
04/08/20: [place] 122. Given rise in [place] and progression in R ilium will switch to Pom 3,   CTX 300, Dex 8 (3/4).  Communicated with Dr. Iona Coach.

## 2020-04-16 LAB — COMPLETE BLOOD COUNT WITH DIFF
% Basophils: 2 %
% Eosinophils: 3 %
% Monocytes: 13 %
% Neutrophils: 39 %
Abs Basophils: 0.1 10*3/uL (ref 0.0–0.2)
Abs Eosinophils: 0.1 10*3/uL (ref 0.0–0.4)
Abs Lymphocytes: 1.2 10*3/uL (ref 0.7–3.1)
Abs Monocytes: 0.4 10*3/uL (ref 0.1–0.9)
Hematocrit: 37.3 % (ref 34.0–46.6)
Hemoglobin: 12.2 g/dL (ref 11.1–15.9)
Lymphs: 43 %
MCH: 30.4 pg (ref 26.6–33.0)
MCHC: 32.7 g/dL (ref 31.5–35.7)
MCV: 93 fL (ref 79–97)
Neutrophils Absolute: 1.1 10*3/uL — ABNORMAL LOW (ref 1.4–7.0)
Platelet Count: 184 10*3/uL (ref 150–450)
RBC Count: 4.01 x10E6/uL (ref 3.77–5.28)
RDW: 14.7 % (ref 11.7–15.4)
WBC Count: 2.9 10*3/uL — ABNORMAL LOW (ref 3.4–10.8)

## 2020-04-16 LAB — COMPREHENSIVE METABOLIC PANEL
AST: 17 IU/L (ref 0–40)
Alanine transaminase: 25 IU/L (ref 0–32)
Albumin, Serum / Plasma: 4.1 g/dL (ref 3.8–4.9)
Albumin/Globulin Ratio: 1.6 (ref 1.2–2.2)
Alkaline Phosphatase: 87 IU/L (ref 48–121)
BUN/Creatinine Ratio (External: 18 (ref 12–28)
Bilirubin, Total: 1.3 mg/dL — ABNORMAL HIGH (ref 0.0–1.2)
Calcium, total, Serum / Plasma: 9.3 mg/dL (ref 8.7–10.3)
Carbon Dioxide, Total: 24 mmol/L (ref 20–29)
Chloride, Serum / Plasma: 107 mmol/L — ABNORMAL HIGH (ref 96–106)
Creatinine: 0.72 mg/dL (ref 0.57–1.00)
Globulin, Total: 2.5 g/dL (ref 1.5–4.5)
Glucose, non-fasting: 95 mg/dL (ref 65–99)
Potassium, Serum / Plasma: 3.3 mmol/L — ABNORMAL LOW (ref 3.5–5.2)
Protein, Total, Serum / Plasma: 6.6 g/dL (ref 6.0–8.5)
Sodium, Serum / Plasma: 143 mmol/L (ref 134–144)
Urea Nitrogen, Serum / Plasma: 13 mg/dL (ref 8–27)
eGFR - high estimate: 105 mL/min/{1.73_m2} (ref >59)
eGFR - low estimate: 91 mL/min/{1.73_m2} (ref >59)

## 2020-04-16 LAB — SARS COV 2 AB, TOTAL SPIKE SEM: Sars-CoV-2 Semi-Quant Total AB: <0.4 U/mL (ref <0.8)

## 2020-04-16 LAB — LACTATE DEHYDROGENASE, BLOOD: Lactate Dehydrogenase: 135 IU/L (ref 119–226)

## 2020-04-17 LAB — PROTEIN ELECTROPHORESIS, SERUM
A/G Ratio: 1.1 (ref 0.7–1.7)
Albumin, SPEP: 3.4 g/dL (ref 2.9–4.4)
Alpha 1 Globulin: 0.3 g/dL (ref 0.0–0.4)
Alpha 2 Globulin: 0.6 g/dL (ref 0.4–1.0)
Beta Globulin: 1 g/dL (ref 0.7–1.3)
Gamma Globulin: 1.2 g/dL (ref 0.4–1.8)
Globulin, Total: 3.1 g/dL (ref 2.2–3.9)
M-Spike, serum: 1.1 g/dL — ABNORMAL HIGH
Protein, Total, Serum / Plasma: 6.5 g/dL (ref 6.0–8.5)

## 2020-04-17 LAB — IGM, SERUM: IgM: 11 mg/dL — ABNORMAL LOW (ref 26–217)

## 2020-04-17 LAB — KAPPA AND LAMBDA FREE LIGHT CH
Kappa Lt Chain, Free: 123.1 mg/L — ABNORMAL HIGH (ref 3.3–19.4)
Kappa/Lambda Ratio: 29.31 — ABNORMAL HIGH (ref 0.26–1.65)
Lambda Lt Chain, Free: 4.2 mg/L — ABNORMAL LOW (ref 5.7–26.3)

## 2020-04-17 LAB — IGG, SERUM: IgG: 1440 mg/dL (ref 586–1602)

## 2020-04-17 LAB — IGA, SERUM: IgA: 22 mg/dL — ABNORMAL LOW (ref 87–352)

## 2020-04-17 MED ORDER — DEXAMETHASONE 4 MG TABLET
4 | ORAL_TABLET | ORAL | 3 refills | 6.00000 days | Status: AC
Start: 2020-04-17 — End: 2022-01-09

## 2020-04-18 ENCOUNTER — Ambulatory Visit: Admit: 2020-04-19 | Payer: PRIVATE HEALTH INSURANCE | Attending: Physician

## 2020-04-18 DIAGNOSIS — C9002 Multiple myeloma in relapse: Secondary | ICD-10-CM

## 2020-04-18 NOTE — Progress Notes (Signed)
Ariel Braun  ZO:[id number]    Date of Service:   04/18/20      Ariel Braun is a 59 y.o. female with a history of myeloma.    For review the patient history is as follows:     IgG kappa myeloma, diagnosed 09/07/10     Significant bone involvement, 8% plasma cells, normal cyto 46XX, FISH showed only 3 copies 1q21, Stage 2 ISS (3.88 beta 2 microglobulin),IgG 4750, M-spike 3.2, kappa 586.9 mg/L.     RVD 10/2010 through April 2011 (? 4 cycles). Achieved VGPR with M-spike 0.4, IgG 886, kappa 27.6.     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     8 months post transplant, her M-spike continues to fall (0.2 on 11/26/11), she's doing great.     She has intermittent pain right hip, with x-ray showing old lesion. Would recommend PET/CT.     01/21/12: M-spike 0.3, kappa 16.5, IgG 810. PET/CT neg.     Swelling of feet and nausea (maybe HAE or zometa).     Then had HAE attacks x 2 with abdominal pain and vomiting.     Observation and no bisphos 2/2 her concerns.     08/11/12: Stable M-protein 0.2, IgG 729, kappa 12.3 in August and IgG 723.     09/29/12: MRI shoulder no myeloma. Kappa 17.     12/08/12: HAE attacks more frequent (weekly) but less severe. To receive C 1 esterase inhibitor (Berinert) prior to receiving pneumovax, TdAP, and hep B (12/11/12).     12/18/12. Slow rise in para-protein to M-spike 0.4     01/26/13: Did well with immunizations with Berinert pre-med. Hasn't used [employer] since.     03/14/13: PET/CT no uptake. Meanwhile, M-spike of 0.4, IgG 937, kappa 18.5.     06/12/13: No HAE. M-spike 0.6, IgG 1270.     08/24/13: M-spike 0.6, IG 1330, kappa 32.3. Continue to observe.     03/29/14: MRI of hip no myeloma. M-spike 1.1.     06/11/14: PET normal.     08/05/14: Started Rev 15 mg 3/4. M-spike 1, IgG 1640, kappa 62.2     09/23/14: Seeing [name]. Less pain in feet since Rev.M-spike 0.8, IgG 1532, kappa 62.     02/17/15: Kappa up a bit to 79.6. Proposed adding Ixazomib to Rev dex  if she gets to kappa 100.     06/10/15: Kappa 76.9 Stable on Rev. Although ANC however at about 0.8 to 1.     09/16/15: Kappa 69.9 (down) but ANC 0.8. Will reduce Revlimid to 15 mg qod to allow ANC to rise a bit.     12/09/15: Feels much better on Rev 15 mg qod, but M-spike up to 1.1 and IgG 1660. Will add dex 8 mg weekly and ativan for anxiety and sleep.     04/06/16: Now on dex 4 mg two days in a row, Rev 15 mg q o d.   06/04/16: Kappa 30.1. Cont. QOD Rev 33m. PET scan ordered and scheduled.     08/20/16: Feels fine. Myeloma labs stable with M-spike 1.1, IgG 1500, kappa 59. PET/CT neg from 7/28. Plan to continue Rev 15 mg qod, Dex 8 mg qwk.     12/14/16: Minimal left flank pain which is rare and intermittent. No hematuria. Myeloma labs better if anything with M-spike 0.9 and Kappa 44.2. Continue Rev 15 mg qod and dex 8 mg  q week.     03/15/17: Pain gone. IgG 1530. Cont Rev 15 mg qod and dex 8 mg weekly.     06/21/17: Stable M-1.2, IgG 1480, KLC 58.9. Cont Rev 15 mg qod and dex 8 mg q wk.     09/16/17: M-1.2. KLC 52.6. Continue Rev 15 mg qod. Dex 8 mg q wk     09/16/17: Stable. M-0.2, kappa 59. Continue Rev 15 mg qod and dex 8 mg q wk. Note: no HAE episodes in 2 years.     03/19/18: Multiple PEs. On Eliquis. Hold Rev.     04/11/18: M-1.4, WVP71. Resume Rev at 15 mg daily (3/4), then qod, Cont dex 8 mg qwk. Cont Eliquis. Pneumonia as well as PE's in early May. Need to repeat CT chest due to a mass (3.4 cm x 1.6 cm) at left posterior sulcus.     05/10/18: Rev 15 mg qod.     05/17/18: Repeat CT ([place]) with improvement and no mass or continued PEs. Mild intermittent pain left posterior chest (not pleuritic). M-1.3, IgG 1510, KLC 64 (stable).     08/08/18: Chest pain better. CT scan > resolution of PEs.     12/12/18: KLC 78. Rev 15 mg qod, dex 8 mg (4/4).     04/17/19: M-1.4, KLC 96.3. ? PD. Up Rev to 15 mg qd (3/4) and dex 8 mg wkly(4/4).      07/20/19: L hip pain. M-1.2, KLC 96. Cont. Rev 15 mg (3/4)/dex 8 mg (4/4).     08/21/19:  [place] 101,  M-1.2.      12/11/19: Brief pains L hip. [place] 88. Stable. PCD vs local XRT.     01/01/20: PET > sclerotic lesion R ilium/mid right SI jt with SUV 4.4.      02/12/20: L hip pain better. KLC 73.     02/19/20: KLC 109 (PD).     04/08/20: [place] 122. Given rise in Lyman and progression in R ilium will switch to Pom 3,   CTX 300, Dex 8 (3/4).  Communicated with Dr. Iona Coach.      Interval History:     The patient is here per her request to switch providers. She is here with her daughter, [name].     Right now, HAE attack 1/year. It was the worse in the beginning before the [place]    Intellectually disabled son with medical issues  Mom lives with her and has dementia, BP, DM  Husband is a DM1    She has a lot of anxiety.    She is the main caregiver. She is first gen [place] and culturally the daughter is the caregiver.    She has questions about the regimens listed on Dr. Earlie Server talk.      Past medical, family and social histories, as well as current medications and allergies, were reviewed and updated in the medical record as appropriate.       Lab Results   Component Value Date    WBC Count 2.9 (L) 04/15/2020    Hemoglobin 12.2 04/15/2020    Hematocrit 37.3 04/15/2020    MCV 93 04/15/2020    Platelet Count 184 04/15/2020     Lab Results   Component Value Date    Albumin, Serum / Plasma 4.1 04/15/2020      Lab Results   Component Value Date    Creatinine 0.72 04/15/2020    Creatinine Whole Blood 0.8 06/05/2014      Lab Results   Component Value  Date    Sodium, Serum / Plasma 143 04/15/2020    Potassium, Serum / Plasma 3.3 (L) 04/15/2020    Chloride, Serum / Plasma 107 (H) 04/15/2020    Carbon Dioxide, Total 24 04/15/2020     No results found for: TPUR  Prot Electrophoresis   Date Value Ref Range Status   08/03/2019 Abnormal (A) Normal pattern. 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 Paraprotein spike = 1.2 g/dL.  Interpretation by Pathologist: Clelia Schaumann,  M.D.     07/05/2019 Abnormal (A) Normal pattern. 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 Paraprotein spike = 1.2 g/dL.  Interpretation by Pathologist: Clelia Schaumann, M.D.     06/08/2019 Abnormal (A) Normal pattern. Final     Comment:     Hypoalbuminemia with a protein spike in the gamma region, consistent with a monoclonal gammopathy. Paraprotein spike = 1.1 g/dL.  Interpretation by Pathologist: Clelia Schaumann, M.D.       Results for orders placed or performed in visit on 03/28/20   Kappa and Lambda Free light Chains, Serum   Result Value Ref Range    Kappa Lt Chain, Free 123.1 (H) 3.3 - 19.4 mg/L    Lambda Lt Chain, Free 4.2 (L) 5.7 - 26.3 mg/L    Kappa/Lambda Ratio 29.31 (H) 0.26 - 1.65    Narrative    Performed at:  [facility]  [address]  [place], [place], [place]  [id number]  Lab Director: Orene Desanctis MD, Phone:  [phone]   Results for orders placed or performed in visit on 02/29/20   Kappa and Lambda Free light Chains, Serum   Result Value Ref Range    Kappa Lt Chain, Free 121.7 (H) 3.3 - 19.4 mg/L    Lambda Lt Chain, Free 4.1 (L) 5.7 - 26.3 mg/L    Kappa/Lambda Ratio 29.68 (H) 0.26 - 1.65    Narrative    Performed at:  [facility]  [address]  [address] [place], [place], [place]  [id number]  Lab Director: Orene Desanctis MD, Phone:  [phone]   Results for orders placed or performed in visit on 02/12/20   Kappa and Lambda Free light Chains, Serum   Result Value Ref Range    Kappa Lt Chain, Free 109.0 (H) 3.3 - 19.4 mg/L    Lambda Lt Chain, Free 3.9 (L) 5.7 - 26.3 mg/L    Kappa/Lambda Ratio 27.95 (H) 0.26 - 1.65    Narrative    Performed at:  [facility]  [address] [place], [place], [place]  [id number]  Lab Director: Orene Desanctis MD, Phone:  [phone]     Lab Results   Component Value Date    IgA, serum 25 (L) 04/03/2019    IgA, serum 22 (L) 03/16/2018    IgA, serum 44 (L)  01/29/2016    IgG, serum 1,490 08/03/2019    IgG, serum 1,520 07/05/2019    IgG, serum 1,420 06/08/2019    IgM, serum 18 (L) 04/03/2019    IgM, serum 25 (L) 03/16/2018    IgM, serum 33 (L) 01/29/2016               ASSESSMENT and PLAN:    Response to therapy (if  on therapy) is: PD       #IgGk MM: She relapsed post ASCT and PD occurred 02/04/2014 (confirmed). Rev was started 08/05/2014 with SD and then Rev/Dex 11/2015 now with clinical PD  - we reviewed her clinical course  - we discussed the pros and cons of the following regimens, 1) DARA/Pom/dex; 2) DARA/CFZ; 3) Pom/Cy/Dex  - she decided to go with DARA/Pom/dex  DARA SQ weekly x 8, q2wk x4 mo, then monthly  Pom 60m D1-21/28  Dex 130mD1, 8, 15/28  - Can stop pre-med steroids if no DARA-related IRR after 2 doses  - no need for post-dara steroids  - give singulair as pre-med prior to dara  - HepB testing per usual prior to dara initiation (patient wanted to make sure I ask the local doc to do this)    #HAE: well controlled  - management per allergy    #History of PEs: Occurred while on IMiD therapy  - continue eliquis    #Ppx:  - start acyclovir   - continue eliquis    #Follow-up: 2 weeks with [place] to continue addressing patients questions about myeloma     Thank you for involving me in the care of your patient. Please do not hesitate to call me anytime to discuss this patient.    SaKennon RoundsMD  Assistant Clinical Professor  Hematology/Blood and Marrow Transplantation  [facility]   I spent a total of 63 minutes on this patient's care on the day of their visit excluding time spent related to any billed procedures. This time includes time spent with the patient as well as time spent documenting in the medical record, reviewing patient's records and tests, obtaining history, placing orders, communicating with other healthcare professionals, counseling the patient, family, or caregiver, and/or care coordination for the diagnoses above.    I performed this  evaluation using real-time telehealth tools, including a live video Zoom connection between my location and the patient's location. Prior to initiating, the patient consented to perform this evaluation using telehealth tools.

## 2020-04-22 MED ORDER — AMOXICILLIN 500 MG CAPSULE
500 | ORAL_CAPSULE | Freq: Every day | ORAL | 3 refills | 5.00000 days | Status: AC | PRN
Start: 2020-04-22 — End: 2022-01-09

## 2020-05-02 ENCOUNTER — Ambulatory Visit: Admit: 2020-05-03 | Payer: PRIVATE HEALTH INSURANCE | Attending: Physician

## 2020-05-02 DIAGNOSIS — C9002 Multiple myeloma in relapse: Secondary | ICD-10-CM

## 2020-05-02 NOTE — Patient Instructions (Addendum)
We agree with starting Darzalex Faspro, Pomalyst, and dex. As we discussed:    1) It is totally reasonable for Dr. Sabra Heck office to have you stay in the building for an extra 1-2 hours with each dose of Darzalex given your history of HAE.  2) We would recommend adding Singulair (montelukast) before each injection as well.    We'll see you back in 4 weeks.

## 2020-05-02 NOTE — Progress Notes (Addendum)
Patient Name:  Ariel Braun    DOB:  1961-01-16    MEDICAL RECORD NUMBER [id number]    Date of Visit: 05/02/2020     Today I saw Ariel Braun in the [facility], in consultation regarding myeloma. She has been referred internally by Dr. Eliberto Ivory. I have reviewed her medical records from [place] and will summarize them here for our records.    CHIEF COMPLAINT: Myeloma    SUMMARY OF HEMATOLOGIC HISTORY:  Ariel Braun is a 59 y.o. female who presents with myeloma. 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 M-spike 32, kappa 596.9 mg/L, IgG 4750, b2M 3.88. She was treated with VRd x4 (achieving VGPR), then MEL 200 ASCT 05/12/11, then observation alone. 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 She met criteria for PD on 02/19/2020 and was started on DARA-Pd in June 2021.    INTERVAL HISTORY:   - Overall feels okay. 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   - Met with Dr. Corky Sing NP earlier today re: Dara-Pd education. Has questions about how these medications will interact with her HAE. Her prior HAE attacks have been associated with emotional stress, occur about once per year.   - Remains on Rev/dex currently while awaiting logistics    ROS:  Constitutional:  Denies fever or chills   Eyes:  Denies change in visual acuity   HENT:  Denies nasal congestion or sore throat   Respiratory:  Denies cough or shortness of breath   Cardiovascular:  Denies chest pain or edema   GI:  Denies abdominal pain, nausea, vomiting, bloody stools or diarrhea   GU:  Denies dysuria   Musculoskeletal:  Denies back pain or joint pain   Integument:  Denies rash   Neurologic:  Denies headache, focal weakness or sensory changes   Endocrine:  Denies polyuria or polydipsia   Lymphatic:  Denies swollen glands   Psychiatric:  Denies depression or anxiety   The remainder of a 14 point review of systems was reviewed and was  negative.    ALLERGIES:  Allergies/Contraindications   Allergen Reactions    Caffeine Other (See Comments)     jitters       MEDICATIONS:  Current Outpatient Medications   Medication Sig Dispense Refill    acyclovir (ZOVIRAX) 400 mg tablet TAKE 1 TABLET BY MOUTH TWICE A DAY 180 tablet 1    ALPRAZolam (XANAX) 1 mg tablet Take 1 mg by mouth daily as needed.       amoxicillin (AMOXIL) 500 mg capsule Take 4 capsules (2,000 mg total) by mouth daily as needed (1 hour prior to dental procedures) 4 capsule 3    aspirin 81 mg EC tablet Take 81 mg by mouth Daily.      azithromycin (ZITHROMAX) 500 mg tablet One po 1 hour prior to dental work 2 tablet 3    CALCIUM CITRATE/VITAMIN D3 (CALCIUM CITRATE + D ORAL) Take 2 tablets by mouth 2 (two) times daily.       cholecalciferol, vitamin D3, (VITAMIN D3) 1,000 unit CAP Take 1,000 Units by mouth 2 (two) times daily.      cholestyramine light (CHOLESTYRAMINE LIGHT) 4 gram packet Take 1 packet (4 g total) by mouth Twice a day 180 packet 3    dexAMETHasone (DECADRON) 4 mg tablet TAKE 2 TABLETS (8 MG TOTAL) BY  MOUTH EVERY 7 DAYS. 24 tablet 3    diphenhydrAMINE (BENADRYL) 50 mg capsule Take 50 mg by mouth nightly as needed for Sleep.      ecallantide (KALBITOR) 10 mg/mL (1 mL) SOLN Inject into the skin once as needed.      EPINEPHrine (EPIPEN) 0.3 mg/0.3 mL (1:1,000) injection Inject 0.3 mg into the muscle once as needed. Use as instructed      icatibant (FIRAZYR) 30 mg/3 mL SYRINGE Inject into the skin once as needed.      LORazepam (ATIVAN) 1 mg tablet Take 1 tablet (1 mg total) by mouth nightly as needed (Insomnia). 30 tablet 0    magnesium 250 mg TAB Take 1 tablet by mouth 2 (two) times daily.      metoclopramide HCl (REGLAN) 10 mg tablet Take 10 mg by mouth once as needed.      multivitamin tablet Take 1 tablet by mouth Daily.       ondansetron (ZOFRAN) 8 mg tablet Take 1 tablet (8 mg total) by mouth every 8 (eight) hours as needed. 30 tablet 2    oxyCODONE  (ROXICODONE) 5 mg immediate release tablet Take 1-2 tablets (5-10 mg total) by mouth every 4 (four) hours as needed. 60 tablet 0    potassium chloride (KLOR-CON M10) 10 mEq ER tablet TAKE 1 TABLET BY MOUTH EVERY DAY 90 tablet 1    predniSONE (DELTASONE) 20 mg tablet Take 20 mg by mouth once as needed.      REVLIMID 15 mg capsule Take 15 mg by mouth every other day.        No current facility-administered medications for this visit.       PAST MEDICAL HISTORY:  Past Medical History:   Diagnosis Date    Acid reflux disease     Anxiety     Depression     Hereditary angioedema     Multiple myeloma, without mention of having achieved remission     IgG kappa    Myeloma 05/10/2011       PAST SURGICAL HISTORY:  Past Surgical History:   Procedure Laterality Date    AUTOLOGOUS STEM CELL TRANSPLANTATION  05/12/11    BREAST CYST EXCISION  1981       FAMILY HISTORY:  Family History   Problem Relation Name Age of Onset    Diabetes Mother      Hypertension Mother      Colon cancer Father      Hypertension Brother ##Brother1        SOCIAL HISTORY:  Social History     Socioeconomic History    Marital status: Married     Spouse name: Not on file    Number of children: Not on file    Years of education: Not on file    Highest education level: Not on file   Tobacco Use    Smoking status: Never Smoker    Smokeless tobacco: Never Used   Substance and Sexual Activity    Alcohol use: No    Drug use: No    Sexual activity: Not Currently     Social Determinants of Health     Financial Resource Strain:     Difficulty of Paying Living Expenses:    Food Insecurity:     Worried About Estate manager/land agent of Food in the Last Year:     Arboriculturist in the Last Year:    Transportation Needs:     Film/video editor (Medical):  Lack of Transportation (Non-Medical):    Physical Activity:     Days of Exercise per Week:     Minutes of Exercise per Session:    Stress:     Feeling of Stress :    Social Connections:      Frequency of Communication with Friends and Family:     Frequency of Social Gatherings with Friends and Family:     Attends Religious Services:     Active Member of Clubs or Organizations:     Attends Music therapist:     Marital Status:    Intimate Partner Violence:     Fear of Current or Ex-Partner:     Emotionally Abused:     Physically Abused:     Sexually Abused:          PHYSICAL EXAM:  PHYSICAL EXAM VIA VIDEO VISIT:  ECOG Performance Status: 0 - Asymptomatic   Vital Signs: No vitals  General:  Awake, alert, pleasantly conversant female in no acute distress  HENT:  Normocephalic, Atraumatic, Bilateral external ears normal, No oral exudates, Nose normal. No stridor. No thrush  Eyes:  EOMI, Conjunctiva normal, No discharge  Respiratory:  Normal breathing, No respiratory distress,   Integument:  No rash on visible skin  Neurologic:  Alert & oriented x 3  Psychiatric:  Affect normal, Judgment normal, Mood normal.      LABORATORY DATA:  Prot Electrophoresis   Date Value Ref Range Status   08/03/2019 Abnormal (A) Normal pattern. 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 Paraprotein spike = 1.2 g/dL.  Interpretation by Pathologist: Clelia Schaumann, M.D.       Paraprotein Concentration   Date Value Ref Range Status   08/03/2019 1.2 (A) Negative g/dL Final     SPEP - External   Date Value Ref Range Status   09/23/2014 0.8 H  Final     Immunofixation Electrophoresis, serum   Date Value Ref Range Status   12/12/2018 IgG Kappa (A) Negative Final     Comment:     Paraprotein present, IgG Kappa  Interpretation by Pathologist: Clelia Schaumann, M.D.       Lab Results   Component Value Date    IgA 22 (L) 04/15/2020    IgA - External 55 L 09/23/2014    IgG, serum 1,490 08/03/2019    IgG - External 1,532 09/23/2014    IgM, serum 18 (L) 04/03/2019    IgM - External 68 09/23/2014    Kappa (Serum, Free) - External 68.68 05/10/2019    Kappa Lt Chain,  Free 123.1 (H) 04/15/2020    Lambda (Serum, Free) - External 3.38 05/10/2019    Lambda Lt Chain, Free 4.2 (L) 04/15/2020    Kappa/Lambda Ratio 29.31 (H) 04/15/2020          PATHOLOGY:  As noted above.    RADIOLOGY:  As noted above.    ASSESSMENT &PLAN  The patient is a 59 y.o. female who presents for evaluation of myeloma.    # IgG kappa MM: Prior lines of therapy include (1) VRd --> MEL ASCT --> observation, (2) Rev --> Rd, and now (3) DARA-Pom/dex planned 04/2020. Currently remains on Rev/dex awaiting insurance auth for new treatment   - Dr. Sabra Heck clinic working on logistics of DARA-Pd with subQ dara, pom (69m daily 21/28 days), dex (165mweekly D1/8/15 out of 28)  - I text Dr.  Johl regarding 3 hr monitoring after first dose DARA and then 1 hr monitoring after second dose DARA. No monitoring needed thereafter as long as no IRR   - Agree with Singulair pre-med before dara given HAE history. Can drop pre-med dexamethasone if no infusion reactions after 2 doses. Reasonable to check LFTs frequently with first 1-2 cycles given pt's concern about DILI (that being said, she has not had transaminitis with Revlimid so would be unlikely with Pomalyst)   - Okay to continue Rd in interim (last day 6/22)    # Hereditary angioedema:   - Follows with allergist locally   - Carries epi-pen. We will discuss with Dr. Sabra Heck office re: monitoring for allergic reactions to her MM meds    # History of PEs: Occurred while on Revlimid.   - Continue low-dose Eliquis indefinitely, particularly while on Pom    #Ppx:  - Continue acyclovir   - Continue Eliquis    #Follow-up: 4 weeks      The case was discussed with attending physician Dr. Lurline Del, MD.    The above plan was reviewed with the patient and all questions and issues were addressed to the patient's satisfaction.    Method of education:  Verbal and Written. Patient ready and able to be educated: Yes. Patient/family verbalized understanding of information and instructions given:  Yes. Counseling performed:  Treatment options and side effects.    Magdalene Patricia, MD  Fellow, Hematology / Oncology      Problems: patient's active cancer represents a life-threatening illness  Risk of complications, morbidity/mortality of patient management: high; the patient's systemic cancer therapy requires regular and intensive monitoring for potential major/life-threatening toxicities  Data review/interpretation: I reviewed and/or ordered 3+ tests and/or documents    I performed this evaluation using real-time telehealth tools, including a live video Zoom connection between my location and the patient's location. Prior to initiating, the patient consented to perform this evaluation using telehealth tools.

## 2020-05-12 LAB — COMPREHENSIVE METABOLIC PANEL
AST: 15 IU/L (ref 0–40)
Alanine transaminase: 16 IU/L (ref 0–32)
Albumin, Serum / Plasma: 3.9 g/dL (ref 3.8–4.9)
Albumin/Globulin Ratio: 1.7 (ref 1.2–2.2)
Alkaline Phosphatase: 86 IU/L (ref 48–121)
BUN/Creatinine Ratio (External: 14 (ref 12–28)
Bilirubin, Total: 1.3 mg/dL — ABNORMAL HIGH (ref 0.0–1.2)
Calcium, total, Serum / Plasma: 8.6 mg/dL — ABNORMAL LOW (ref 8.7–10.3)
Carbon Dioxide, Total: 26 mmol/L (ref 20–29)
Chloride, Serum / Plasma: 107 mmol/L — ABNORMAL HIGH (ref 96–106)
Creatinine: 0.77 mg/dL (ref 0.57–1.00)
Globulin, Total: 2.3 g/dL (ref 1.5–4.5)
Glucose, non-fasting: 96 mg/dL (ref 65–99)
Potassium, Serum / Plasma: 3.3 mmol/L — ABNORMAL LOW (ref 3.5–5.2)
Protein, Total, Serum / Plasma: 6.2 g/dL (ref 6.0–8.5)
Sodium, Serum / Plasma: 142 mmol/L (ref 134–144)
Urea Nitrogen, Serum / Plasma: 11 mg/dL (ref 8–27)
eGFR - high estimate: 97 mL/min/{1.73_m2} (ref >59)
eGFR - low estimate: 84 mL/min/{1.73_m2} (ref >59)

## 2020-05-12 LAB — COMPLETE BLOOD COUNT WITH DIFF
% Basophils: 1 %
% Eosinophils: 3 %
% Monocytes: 12 %
% Neutrophils: 41 %
Abs Basophils: 0 10*3/uL (ref 0.0–0.2)
Abs Eosinophils: 0.1 10*3/uL (ref 0.0–0.4)
Abs Lymphocytes: 1.2 10*3/uL (ref 0.7–3.1)
Abs Monocytes: 0.3 10*3/uL (ref 0.1–0.9)
Hematocrit: 36.5 % (ref 34.0–46.6)
Hemoglobin: 11.8 g/dL (ref 11.1–15.9)
Lymphs: 43 %
MCH: 29.6 pg (ref 26.6–33.0)
MCHC: 32.3 g/dL (ref 31.5–35.7)
MCV: 92 fL (ref 79–97)
Neutrophils Absolute: 1.1 10*3/uL — ABNORMAL LOW (ref 1.4–7.0)
Platelet Count: 190 10*3/uL (ref 150–450)
RBC Count: 3.98 x10E6/uL (ref 3.77–5.28)
RDW: 14.8 % (ref 11.7–15.4)
WBC Count: 2.8 10*3/uL — ABNORMAL LOW (ref 3.4–10.8)

## 2020-05-12 LAB — LACTATE DEHYDROGENASE, BLOOD: Lactate Dehydrogenase: 142 IU/L (ref 119–226)

## 2020-05-14 LAB — PROTEIN ELECTROPHORESIS, SERUM
A/G Ratio: 1.1 (ref 0.7–1.7)
Albumin, SPEP: 3.4 g/dL (ref 2.9–4.4)
Alpha 1 Globulin: 0.3 g/dL (ref 0.0–0.4)
Alpha 2 Globulin: 0.6 g/dL (ref 0.4–1.0)
Beta Globulin: 1 g/dL (ref 0.7–1.3)
Gamma Globulin: 1.2 g/dL (ref 0.4–1.8)
Globulin, Total: 3.1 g/dL (ref 2.2–3.9)
M-Spike, serum: 1.1 g/dL — ABNORMAL HIGH
Protein, Total, Serum / Plasma: 6.5 g/dL (ref 6.0–8.5)

## 2020-05-14 LAB — IGA, SERUM: IgA: 20 mg/dL — ABNORMAL LOW (ref 87–352)

## 2020-05-14 LAB — KAPPA AND LAMBDA FREE LIGHT CH
Kappa Lt Chain, Free: 137.8 mg/L — ABNORMAL HIGH (ref 3.3–19.4)
Kappa/Lambda Ratio: 24.18 — ABNORMAL HIGH (ref 0.26–1.65)
Lambda Lt Chain, Free: 5.7 mg/L (ref 5.7–26.3)

## 2020-05-14 LAB — IGM, SERUM: IgM: 10 mg/dL — ABNORMAL LOW (ref 26–217)

## 2020-05-14 LAB — IGG, SERUM: IgG: 1325 mg/dL (ref 586–1602)

## 2020-05-22 LAB — ALKALINE PHOSPHATASE - EXTERNA: Alkaline Phosphatase - Externa: 90

## 2020-05-22 LAB — TOTAL PROTEIN, SERUM - EXTERNA: Total Protein, Serum - Externa: 7

## 2020-05-22 LAB — CALCIUM - EXTERNAL: Calcium - External: 8.9

## 2020-05-22 LAB — ALT - EXTERNAL: ALT - External: 18

## 2020-05-22 LAB — EXTERNAL RESULT REPORT SCANNED

## 2020-05-22 LAB — AST - EXTERNAL: AST - External: 13

## 2020-05-22 LAB — CREATININE, SERUM - EXTERNAL: Creatinine, Serum - External: 0.82

## 2020-05-22 LAB — BILIRUBIN, DIRECT - EXTERNAL: Bilirubin, Direct - External: 0.37

## 2020-05-22 LAB — SODIUM - EXTERNAL: Sodium - External: 141

## 2020-05-22 LAB — ALBUMIN, SERUM - EXTERNAL: Albumin, Serum - External: 4.2 g/dL (ref 3.5–4.8)

## 2020-05-22 LAB — BUN - EXTERNAL: BUN - External: 12

## 2020-05-22 LAB — HEPATITIS B CORE ANTIBODY, TOT: Hep B Core Ab Total - External: NEGATIVE

## 2020-05-22 LAB — BILIRUBIN, TOTAL - EXTERNAL: Bilirubin, Total - External: 1.5

## 2020-05-22 LAB — POTASSIUM - EXTERNAL: Potassium - External: 3.3

## 2020-06-11 LAB — COMPREHENSIVE METABOLIC PANEL
AST: 16 IU/L (ref 0–40)
Alanine transaminase: 22 IU/L (ref 0–32)
Albumin, Serum / Plasma: 3.8 g/dL (ref 3.8–4.9)
Albumin/Globulin Ratio: 1.7 (ref 1.2–2.2)
Alkaline Phosphatase: 111 IU/L (ref 48–121)
BUN/Creatinine Ratio (External: 16 (ref 12–28)
Bilirubin, Total: 0.6 mg/dL (ref 0.0–1.2)
Calcium, total, Serum / Plasma: 9.3 mg/dL (ref 8.7–10.3)
Carbon Dioxide, Total: 19 mmol/L — ABNORMAL LOW (ref 20–29)
Chloride, Serum / Plasma: 108 mmol/L — ABNORMAL HIGH (ref 96–106)
Creatinine: 0.83 mg/dL (ref 0.57–1.00)
Globulin, Total: 2.3 g/dL (ref 1.5–4.5)
Glucose, non-fasting: 98 mg/dL (ref 65–99)
Potassium, Serum / Plasma: 3.7 mmol/L (ref 3.5–5.2)
Protein, Total, Serum / Plasma: 6.1 g/dL (ref 6.0–8.5)
Sodium, Serum / Plasma: 142 mmol/L (ref 134–144)
Urea Nitrogen, Serum / Plasma: 13 mg/dL (ref 8–27)
eGFR - high estimate: 89 mL/min/{1.73_m2} (ref >59)
eGFR - low estimate: 77 mL/min/{1.73_m2} (ref >59)

## 2020-06-11 LAB — IGG, SERUM: IgG: 1119 mg/dL (ref 586–1602)

## 2020-06-11 LAB — COMPLETE BLOOD COUNT WITH DIFF
% Basophils: 1 %
% Eosinophils: 2 %
% Monocytes: 15 %
% Neutrophils: 38 %
Abs Basophils: 0 10*3/uL (ref 0.0–0.2)
Abs Eosinophils: 0 10*3/uL (ref 0.0–0.4)
Abs Lymphocytes: 0.9 10*3/uL (ref 0.7–3.1)
Abs Monocytes: 0.3 10*3/uL (ref 0.1–0.9)
Hematocrit: 38.8 % (ref 34.0–46.6)
Hemoglobin: 12.6 g/dL (ref 11.1–15.9)
Lymphs: 44 %
MCH: 30.2 pg (ref 26.6–33.0)
MCHC: 32.5 g/dL (ref 31.5–35.7)
MCV: 93 fL (ref 79–97)
Neutrophils Absolute: 0.8 10*3/uL — ABNORMAL LOW (ref 1.4–7.0)
Platelet Count: 283 10*3/uL (ref 150–450)
RBC Count: 4.17 x10E6/uL (ref 3.77–5.28)
RDW: 15.6 % — ABNORMAL HIGH (ref 11.7–15.4)
WBC Count: 2.1 10*3/uL — CL (ref 3.4–10.8)

## 2020-06-11 LAB — PROTEIN ELECTROPHORESIS, SERUM
A/G Ratio: 0.9 (ref 0.7–1.7)
Albumin, SPEP: 3.2 g/dL (ref 2.9–4.4)
Alpha 1 Globulin: 0.3 g/dL (ref 0.0–0.4)
Alpha 2 Globulin: 1 g/dL (ref 0.4–1.0)
Beta Globulin: 1 g/dL (ref 0.7–1.3)
Gamma Globulin: 1 g/dL (ref 0.4–1.8)
Globulin, Total: 3.4 g/dL (ref 2.2–3.9)
M-Spike, serum: 0.9 g/dL — ABNORMAL HIGH
Protein, Total, Serum / Plasma: 6.6 g/dL (ref 6.0–8.5)

## 2020-06-11 LAB — IGA, SERUM: IgA: 17 mg/dL — ABNORMAL LOW (ref 87–352)

## 2020-06-11 LAB — LACTATE DEHYDROGENASE, BLOOD: Lactate Dehydrogenase: 128 IU/L (ref 119–226)

## 2020-06-11 LAB — KAPPA AND LAMBDA FREE LIGHT CH
Kappa Lt Chain, Free: 100.2 mg/L — ABNORMAL HIGH (ref 3.3–19.4)
Kappa/Lambda Ratio: 30.36 — ABNORMAL HIGH (ref 0.26–1.65)
Lambda Lt Chain, Free: 3.3 mg/L — ABNORMAL LOW (ref 5.7–26.3)

## 2020-06-11 LAB — IGM, SERUM: IgM: 10 mg/dL — ABNORMAL LOW (ref 26–217)

## 2020-06-13 ENCOUNTER — Ambulatory Visit: Admit: 2020-06-13 | Payer: PRIVATE HEALTH INSURANCE | Attending: Physician

## 2020-06-13 DIAGNOSIS — C9002 Multiple myeloma in relapse: Secondary | ICD-10-CM

## 2020-06-13 NOTE — Patient Instructions (Addendum)
It was good to see you today. Continue the Darzalex, Pomalyst, and dex as you are doing.    We'll see you back in 4 weeks.

## 2020-06-13 NOTE — Progress Notes (Addendum)
Patient Name:  Ariel Braun    DOB:  02/09/1961    MEDICAL RECORD NUMBER [id number]    Date of Visit: 06/13/2020     Today I saw Ariel Braun in the [facility]ematology[facility], in consultation regarding myeloma. She has been referred internally by Dr. Eliberto Ivory. I have reviewed her medical records from [place] and will summarize them here for our records.    CHIEF COMPLAINT: Myeloma    SUMMARY OF HEMATOLOGIC HISTORY:  Ariel Braun is a 59 y.o. female who presents with myeloma. 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 M-spike 32, kappa 596.9 mg/L, IgG 4750, b2M 3.88. She was treated with VRd x4 (achieving VGPR), then MEL 200 ASCT 05/12/11, then observation alone. 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 She met criteria for PD on 02/19/2020 and was started on third-line DARA-Pd in June 2021.     INTERVAL HISTORY:   - Accompanied by daughter Ariel Braun via Zoom   - Started DARA-Pd 04/2020 (pom '2mg'$  21/28 days starting June 30th, dara starting July 2nd), which she is tolerating well. No allergic reactions or symptoms of an HAE flare.   - Had to see dentist a few days into cycle for fevers and mouth pain, found to have infected teeth and underwent extraction. No more fevers or mouth pain.  - Did have a R breast US (based on PET-CT showing a R breast density) done on 05/12/20 showing a 1.2cm lobulated solid mass. Biopsy not yet performed. Patient does not notice any lesions or lumps/bumps on her R side.    ROS:  Constitutional:  Denies fever or chills   Eyes:  Denies change in visual acuity   HENT:  Denies nasal congestion or sore throat. Tooth pain resolved.   Respiratory:  Denies cough or shortness of breath   Cardiovascular:  Denies chest pain or edema   GI:  Denies abdominal pain, nausea, vomiting, bloody stools or diarrhea   GU:  Denies dysuria   Musculoskeletal:  Denies back pain or joint pain   Integument:  Denies rash   Neurologic:  Denies headache,  focal weakness or sensory changes   Endocrine:  Denies polyuria or polydipsia   Lymphatic:  Denies swollen glands   Psychiatric:  Denies depression or anxiety   The remainder of a 14 point review of systems was reviewed and was negative.    ALLERGIES:  Allergies/Contraindications   Allergen Reactions    Caffeine Other (See Comments)     jitters       MEDICATIONS:  Current Outpatient Medications   Medication Sig Dispense Refill    acyclovir (ZOVIRAX) 400 mg tablet TAKE 1 TABLET BY MOUTH TWICE A DAY 180 tablet 1    ALPRAZolam (XANAX) 1 mg tablet Take 1 mg by mouth daily as needed.       amoxicillin (AMOXIL) 500 mg capsule Take 4 capsules (2,000 mg total) by mouth daily as needed (1 hour prior to dental procedures) 4 capsule 3    aspirin 81 mg EC tablet Take 81 mg by mouth Daily.      azithromycin (ZITHROMAX) 500 mg tablet One po 1 hour prior to dental work 2 tablet 3    CALCIUM CITRATE/VITAMIN D3 (CALCIUM CITRATE + D ORAL) Take 2 tablets by mouth 2 (two) times daily.       cholecalciferol, vitamin D3, (VITAMIN D3) 1,000 unit CAP Take 1,000 Units  by mouth 2 (two) times daily.      cholestyramine light (CHOLESTYRAMINE LIGHT) 4 gram packet Take 1 packet (4 g total) by mouth Twice a day 180 packet 3    dexAMETHasone (DECADRON) 4 mg tablet TAKE 2 TABLETS (8 MG TOTAL) BY MOUTH EVERY 7 DAYS. 24 tablet 3    diphenhydrAMINE (BENADRYL) 50 mg capsule Take 50 mg by mouth nightly as needed for Sleep.      ecallantide (KALBITOR) 10 mg/mL (1 mL) SOLN Inject into the skin once as needed.      EPINEPHrine (EPIPEN) 0.3 mg/0.3 mL (1:1,000) injection Inject 0.3 mg into the muscle once as needed. Use as instructed      icatibant (FIRAZYR) 30 mg/3 mL SYRINGE Inject into the skin once as needed.      LORazepam (ATIVAN) 1 mg tablet Take 1 tablet (1 mg total) by mouth nightly as needed (Insomnia). 30 tablet 0    magnesium 250 mg TAB Take 1 tablet by mouth 2 (two) times daily.      metoclopramide HCl (REGLAN) 10 mg tablet Take  10 mg by mouth once as needed.      multivitamin tablet Take 1 tablet by mouth Daily.       ondansetron (ZOFRAN) 8 mg tablet Take 1 tablet (8 mg total) by mouth every 8 (eight) hours as needed. 30 tablet 2    oxyCODONE (ROXICODONE) 5 mg immediate release tablet Take 1-2 tablets (5-10 mg total) by mouth every 4 (four) hours as needed. 60 tablet 0    potassium chloride (KLOR-CON M10) 10 mEq ER tablet TAKE 1 TABLET BY MOUTH EVERY DAY 90 tablet 1    predniSONE (DELTASONE) 20 mg tablet Take 20 mg by mouth once as needed.      REVLIMID 15 mg capsule Take 15 mg by mouth every other day.        No current facility-administered medications for this visit.       PAST MEDICAL HISTORY:  Past Medical History:   Diagnosis Date    Acid reflux disease     Anxiety     Depression     Hereditary angioedema     Multiple myeloma, without mention of having achieved remission     IgG kappa    Myeloma 05/10/2011       PAST SURGICAL HISTORY:  Past Surgical History:   Procedure Laterality Date    AUTOLOGOUS STEM CELL TRANSPLANTATION  05/12/11    BREAST CYST EXCISION  1981       FAMILY HISTORY:  Family History   Problem Relation Name Age of Onset    Diabetes Mother      Hypertension Mother      Colon cancer Father      Hypertension Brother ##Brother1        SOCIAL HISTORY:  Social History     Socioeconomic History    Marital status: Married     Spouse name: Not on file    Number of children: Not on file    Years of education: Not on file    Highest education level: Not on file   Tobacco Use    Smoking status: Never Smoker    Smokeless tobacco: Never Used   Substance and Sexual Activity    Alcohol use: No    Drug use: No    Sexual activity: Not Currently     Social Determinants of Health     Financial Resource Strain:     Difficulty of Paying Living  Expenses:    Food Insecurity:     Worried About Charity fundraiser in the Last Year:     Arboriculturist in the Last Year:    Transportation Needs:     Lexicographer (Medical):     Lack of Transportation (Non-Medical):    Physical Activity:     Days of Exercise per Week:     Minutes of Exercise per Session:    Stress:     Feeling of Stress :    Social Connections:     Frequency of Communication with Friends and Family:     Frequency of Social Gatherings with Friends and Family:     Attends Religious Services:     Active Member of Clubs or Organizations:     Attends Music therapist:     Marital Status:    Intimate Partner Violence:     Fear of Current or Ex-Partner:     Emotionally Abused:     Physically Abused:     Sexually Abused:          PHYSICAL EXAM:  PHYSICAL EXAM VIA VIDEO VISIT:  ECOG Performance Status: 0 - Asymptomatic   Vital Signs: No vitals  General:  Awake, alert, pleasantly conversant female in no acute distress  HENT:  Normocephalic, Atraumatic, Bilateral external ears normal, No oral exudates, Nose normal. No stridor. No thrush  Eyes:  EOMI, Conjunctiva normal, No discharge  Respiratory:  Normal breathing, No respiratory distress,   Integument:  No rash on visible skin  Neurologic:  Alert & oriented x 3  Psychiatric:  Affect normal, Judgment normal, Mood normal.      LABORATORY DATA:  Prot Electrophoresis   Date Value Ref Range Status   08/03/2019 Abnormal (A) Normal pattern. 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 Paraprotein spike = 1.2 g/dL.  Interpretation by Pathologist: Clelia Schaumann, M.D.       Paraprotein Concentration   Date Value Ref Range Status   08/03/2019 1.2 (A) Negative g/dL Final     SPEP - External   Date Value Ref Range Status   09/23/2014 0.8 H  Final     Immunofixation Electrophoresis, serum   Date Value Ref Range Status   12/12/2018 IgG Kappa (A) Negative Final     Comment:     Paraprotein present, IgG Kappa  Interpretation by Pathologist: Clelia Schaumann, M.D.       Lab Results   Component Value Date    IgA 17 (L) 06/10/2020    IgA -  External 55 L 09/23/2014    IgG, serum 1,490 08/03/2019    IgG - External 1,532 09/23/2014    IgM, serum 18 (L) 04/03/2019    IgM - External 68 09/23/2014    Kappa (Serum, Free) - External 68.68 05/10/2019    Kappa Lt Chain, Free 100.2 (H) 06/10/2020    Lambda (Serum, Free) - External 3.38 05/10/2019    Lambda Lt Chain, Free 3.3 (L) 06/10/2020    Kappa/Lambda Ratio 30.36 (H) 06/10/2020          PATHOLOGY:  As noted above.    RADIOLOGY:  As noted above.    ASSESSMENT &PLAN  The patient is a 59 y.o. female who presents for evaluation of myeloma.    # IgG kappa MM: Prior lines of therapy include (1) VRd --> MEL ASCT --> observation, (2) Rev -->  Rd, and now (3) DARA-Pom/dex started 04/2020 for M-spike rising to 1.1. Now down to 0.9 as of 06/10/20, although some of this may be DARA interference of course.    - Patient has history of allergic reactions and HAE (was monitored 1-3 hrs after first doses, got Singulair pre-med initially) but no issues so far.   - Cont DARA-Pd   - Continue monthly MM labs    # R breast lesion: Noted on 12/2019 PET-CT, then confirmed on 04/2020 R breast US.   - Dr. Iona Coach (local oncologist) planning a biopsy    # Hereditary angioedema:   - Follows with allergist locally   - No issues with DARA-Pd    # History of PEs: Occurred while on Revlimid.   - Continue low-dose Eliquis indefinitely, particularly while on Pom    #Ppx:  - Continue acyclovir   - Continue Eliquis    #Follow-up: 4 weeks    The case was discussed with attending physician Dr. Lurline Del, MD.  The above plan was reviewed with the patient, with time to address all questions and issues.    Magdalene Patricia, MD  Advanced Fellow, BMT/CAR-T Therapy  Division of Hematology / Oncology      Problems: patient's active cancer represents a life-threatening illness  Risk of complications, morbidity/mortality of patient management: high; the patient's systemic cancer therapy requires regular and intensive monitoring for potential  major/life-threatening toxicities  Data review/interpretation: I reviewed and/or ordered 3+ tests and/or documents    I performed this evaluation using real-time telehealth tools, including a live video Zoom connection between my location and the patient's location. Prior to initiating, the patient consented to perform this evaluation using telehealth tools.

## 2020-07-08 LAB — COMPREHENSIVE METABOLIC PANEL
AST: 13 IU/L (ref 0–40)
Alanine transaminase: 22 IU/L (ref 0–32)
Albumin, Serum / Plasma: 3.9 g/dL (ref 3.8–4.9)
Albumin/Globulin Ratio: 1.9 (ref 1.2–2.2)
Alkaline Phosphatase: 103 IU/L (ref 48–121)
BUN/Creatinine Ratio (External: 15 (ref 12–28)
Bilirubin, Total: 1 mg/dL (ref 0.0–1.2)
Calcium, total, Serum / Plasma: 9 mg/dL (ref 8.7–10.3)
Carbon Dioxide, Total: 24 mmol/L (ref 20–29)
Chloride, Serum / Plasma: 107 mmol/L — ABNORMAL HIGH (ref 96–106)
Creatinine: 0.78 mg/dL (ref 0.57–1.00)
Globulin, Total: 2.1 g/dL (ref 1.5–4.5)
Glucose, non-fasting: 91 mg/dL (ref 65–99)
Potassium, Serum / Plasma: 3.9 mmol/L (ref 3.5–5.2)
Protein, Total, Serum / Plasma: 6 g/dL (ref 6.0–8.5)
Sodium, Serum / Plasma: 144 mmol/L (ref 134–144)
Urea Nitrogen, Serum / Plasma: 12 mg/dL (ref 8–27)
eGFR - high estimate: 96 mL/min/{1.73_m2} (ref >59)
eGFR - low estimate: 83 mL/min/{1.73_m2} (ref >59)

## 2020-07-08 LAB — COMPLETE BLOOD COUNT WITH DIFF
% Basophils: 1 %
% Eosinophils: 3 %
% Monocytes: 18 %
% Neutrophils: 39 %
Abs Basophils: 0 10*3/uL (ref 0.0–0.2)
Abs Eosinophils: 0.1 10*3/uL (ref 0.0–0.4)
Abs Lymphocytes: 1.3 10*3/uL (ref 0.7–3.1)
Abs Monocytes: 0.6 10*3/uL (ref 0.1–0.9)
Hematocrit: 38.1 % (ref 34.0–46.6)
Hemoglobin: 12.1 g/dL (ref 11.1–15.9)
Lymphs: 39 %
MCH: 29.8 pg (ref 26.6–33.0)
MCHC: 31.8 g/dL (ref 31.5–35.7)
MCV: 94 fL (ref 79–97)
Neutrophils Absolute: 1.3 10*3/uL — ABNORMAL LOW (ref 1.4–7.0)
Platelet Count: 276 10*3/uL (ref 150–450)
RBC Count: 4.06 x10E6/uL (ref 3.77–5.28)
RDW: 17.4 % — ABNORMAL HIGH (ref 11.7–15.4)
WBC Count: 3.4 10*3/uL (ref 3.4–10.8)

## 2020-07-08 LAB — PROTEIN ELECTROPHORESIS, SERUM
A/G Ratio: 1.1 (ref 0.7–1.7)
Albumin, SPEP: 3.2 g/dL (ref 2.9–4.4)
Alpha 1 Globulin: 0.3 g/dL (ref 0.0–0.4)
Alpha 2 Globulin: 0.7 g/dL (ref 0.4–1.0)
Beta Globulin: 1 g/dL (ref 0.7–1.3)
Gamma Globulin: 1 g/dL (ref 0.4–1.8)
Globulin, Total: 3 g/dL (ref 2.2–3.9)
M-Spike, serum: 0.8 g/dL — ABNORMAL HIGH
Protein, Total, Serum / Plasma: 6.2 g/dL (ref 6.0–8.5)

## 2020-07-08 LAB — KAPPA AND LAMBDA FREE LIGHT CH
Kappa Lt Chain, Free: 72.5 mg/L — ABNORMAL HIGH (ref 3.3–19.4)
Kappa/Lambda Ratio: 18.59 — ABNORMAL HIGH (ref 0.26–1.65)
Lambda Lt Chain, Free: 3.9 mg/L — ABNORMAL LOW (ref 5.7–26.3)

## 2020-07-08 LAB — IGA, SERUM: IgA: 14 mg/dL — ABNORMAL LOW (ref 87–352)

## 2020-07-08 LAB — LACTATE DEHYDROGENASE, BLOOD: Lactate Dehydrogenase: 139 IU/L (ref 119–226)

## 2020-07-08 LAB — IGM, SERUM: IgM: 10 mg/dL — ABNORMAL LOW (ref 26–217)

## 2020-07-08 LAB — IGG, SERUM: IgG: 1074 mg/dL (ref 586–1602)

## 2020-07-22 ENCOUNTER — Ambulatory Visit: Admit: 2020-07-23 | Payer: PRIVATE HEALTH INSURANCE | Attending: Physician

## 2020-07-22 DIAGNOSIS — C9002 Multiple myeloma in relapse: Secondary | ICD-10-CM

## 2020-07-22 NOTE — Progress Notes (Signed)
Patient Name:  Ariel Braun    DOB:  [birth year]    MEDICAL RECORD NUMBER [id number]    Date of Visit: 07/22/2020     Today I saw Ariel Braun in the [facility] Hematology/BMT Clinic, in consultation regarding myeloma. She has been referred internally by Dr. Eliberto Ivory. I have reviewed her medical records from [place] and will summarize them here for our records.    CHIEF COMPLAINT: Myeloma    SUMMARY OF HEMATOLOGIC HISTORY:  Ariel Braun is a 59 y.o. female who presents with myeloma. 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 M-spike 32, kappa 596.9 mg/L, IgG 4750, b2M 3.88. She was treated with VRd x4 (achieving VGPR), then MEL 200 ASCT 05/12/11, then observation alone. 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 She met criteria for PD on 02/19/2020 and was started on third-line DARA-Pd in June 2021.     INTERVAL HISTORY:   - Accompanied by daughter [name] via Zoom   - Started DARA-Pd 04/2020 (pom 57m 21/28 days starting June 30th, dara starting July 2nd), which she is tolerating well. No allergic reactions or symptoms of an HAE flare.   - Had to see dentist a few days into cycle for fevers and mouth pain, found to have infected teeth and underwent extraction. No more fevers or mouth pain.  - Did have a R breast [place](based on PET-CT showing a R breast density) done on 05/12/20 showing a 1.2cm lobulated solid mass. Biopsy done show a papillary lesion and her doc is recommending surgery.  - not much side effects from the DARA/Pom/dex. Sometimes has insomnia. Has alternating/constipation. Energy level is ok but episodically tired and sleepy especially if insomnia the night before but getting better.       ALLERGIES:  Allergies/Contraindications   Allergen Reactions    Caffeine Other (See Comments)     jitters       MEDICATIONS:  Current Outpatient Medications   Medication Sig Dispense Refill    acyclovir (ZOVIRAX) 400 mg tablet TAKE 1 TABLET BY MOUTH TWICE A  DAY 180 tablet 1    ALPRAZolam (XANAX) 1 mg tablet Take 1 mg by mouth daily as needed.       amoxicillin (AMOXIL) 500 mg capsule Take 4 capsules (2,000 mg total) by mouth daily as needed (1 hour prior to dental procedures) 4 capsule 3    aspirin 81 mg EC tablet Take 81 mg by mouth Daily.      azithromycin (ZITHROMAX) 500 mg tablet One po 1 hour prior to dental work 2 tablet 3    CALCIUM CITRATE/VITAMIN D3 (CALCIUM CITRATE + D ORAL) Take 2 tablets by mouth 2 (two) times daily.       cholecalciferol, vitamin D3, (VITAMIN D3) 1,000 unit CAP Take 1,000 Units by mouth 2 (two) times daily.      cholestyramine light (CHOLESTYRAMINE LIGHT) 4 gram packet Take 1 packet (4 g total) by mouth Twice a day 180 packet 3    dexAMETHasone (DECADRON) 4 mg tablet TAKE 2 TABLETS (8 MG TOTAL) BY MOUTH EVERY 7 DAYS. 24 tablet 3    diphenhydrAMINE (BENADRYL) 50 mg capsule Take 50 mg by mouth nightly as needed for Sleep.      ecallantide (KALBITOR) 10 mg/mL (1 mL) SOLN Inject into the skin once as needed.      EPINEPHrine (EPIPEN) 0.3 mg/0.3 mL (1:1,000) injection Inject 0.3 mg  into the muscle once as needed. Use as instructed      icatibant (FIRAZYR) 30 mg/3 mL SYRINGE Inject into the skin once as needed.      LORazepam (ATIVAN) 1 mg tablet Take 1 tablet (1 mg total) by mouth nightly as needed (Insomnia). 30 tablet 0    magnesium 250 mg TAB Take 1 tablet by mouth 2 (two) times daily.      metoclopramide HCl (REGLAN) 10 mg tablet Take 10 mg by mouth once as needed.      multivitamin tablet Take 1 tablet by mouth Daily.       ondansetron (ZOFRAN) 8 mg tablet Take 1 tablet (8 mg total) by mouth every 8 (eight) hours as needed. 30 tablet 2    oxyCODONE (ROXICODONE) 5 mg immediate release tablet Take 1-2 tablets (5-10 mg total) by mouth every 4 (four) hours as needed. 60 tablet 0    potassium chloride (KLOR-CON M10) 10 mEq ER tablet TAKE 1 TABLET BY MOUTH EVERY DAY 90 tablet 1    predniSONE (DELTASONE) 20 mg tablet Take 20  mg by mouth once as needed.      REVLIMID 15 mg capsule Take 15 mg by mouth every other day.        No current facility-administered medications for this visit.       PAST MEDICAL HISTORY:  Past Medical History:   Diagnosis Date    Acid reflux disease     Anxiety     Depression     Hereditary angioedema     Multiple myeloma, without mention of having achieved remission     IgG kappa    Myeloma 05/10/2011       PAST SURGICAL HISTORY:  Past Surgical History:   Procedure Laterality Date    AUTOLOGOUS STEM CELL TRANSPLANTATION  05/12/11    BREAST CYST EXCISION  1981       FAMILY HISTORY:  Family History   Problem Relation Name Age of Onset    Diabetes Mother      Hypertension Mother      Colon cancer Father      Hypertension Brother ##Brother1        SOCIAL HISTORY:  Social History     Socioeconomic History    Marital status: Married     Spouse name: Not on file    Number of children: Not on file    Years of education: Not on file    Highest education level: Not on file   Tobacco Use    Smoking status: Never Smoker    Smokeless tobacco: Never Used   Substance and Sexual Activity    Alcohol use: No    Drug use: No    Sexual activity: Not Currently     Social Determinants of Health     Financial Resource Strain:     Difficulty of Paying Living Expenses:    Food Insecurity:     Worried About Charity fundraiser in the Last Year:     Arboriculturist in the Last Year:    Transportation Needs:     Film/video editor (Medical):     Lack of Transportation (Non-Medical):    Physical Activity:     Days of Exercise per Week:     Minutes of Exercise per Session:    Stress:     Feeling of Stress :    Social Connections:     Frequency of Communication with Friends and Family:  Frequency of Social Gatherings with Friends and Family:     Attends Religious Services:     Active Member of Clubs or Organizations:     Attends Music therapist:     Marital Status:    Intimate Partner  Violence:     Fear of Current or Ex-Partner:     Emotionally Abused:     Physically Abused:     Sexually Abused:          PHYSICAL EXAM:  PHYSICAL EXAM VIA VIDEO VISIT:  ECOG Performance Status: 0 - Asymptomatic   Vital Signs: No vitals  General:  Awake, alert, pleasantly conversant female in no acute distress  HENT:  Normocephalic, Atraumatic, Bilateral external ears normal, No oral exudates, Nose normal. No stridor. No thrush  Eyes:  EOMI, Conjunctiva normal, No discharge  Respiratory:  Normal breathing, No respiratory distress,   Integument:  No rash on visible skin  Neurologic:  Alert & oriented x 3  Psychiatric:  Affect normal, Judgment normal, Mood normal.      LABORATORY DATA:  Prot Electrophoresis   Date Value Ref Range Status   08/03/2019 Abnormal (A) Normal pattern. 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 Paraprotein spike = 1.2 g/dL.  Interpretation by Pathologist: Clelia Schaumann, M.D.       Paraprotein Concentration   Date Value Ref Range Status   08/03/2019 1.2 (A) Negative g/dL Final     SPEP - External   Date Value Ref Range Status   09/23/2014 0.8 H  Final     Immunofixation Electrophoresis, serum   Date Value Ref Range Status   12/12/2018 IgG Kappa (A) Negative Final     Comment:     Paraprotein present, IgG Kappa  Interpretation by Pathologist: Clelia Schaumann, M.D.       Lab Results   Component Value Date    IgA 14 (L) 07/07/2020    IgA - External 55 L 09/23/2014    IgG, serum 1,490 08/03/2019    IgG - External 1,532 09/23/2014    IgM, serum 18 (L) 04/03/2019    IgM - External 68 09/23/2014    Kappa (Serum, Free) - External 68.68 05/10/2019    Kappa Lt Chain, Free 72.5 (H) 07/07/2020    Lambda (Serum, Free) - External 3.38 05/10/2019    Lambda Lt Chain, Free 3.9 (L) 07/07/2020    Kappa/Lambda Ratio 18.59 (H) 07/07/2020          PATHOLOGY:  As noted above.    RADIOLOGY:  As noted above.    ASSESSMENT &PLAN  The patient is a 59 y.o.  female who presents for evaluation of myeloma.    # IgG kappa MM: Prior lines of therapy include (1) VRd --> MEL ASCT --> observation, (2) Rev --> Rd, and now (3) DARA-Pom/dex started 04/2020 for M-spike rising to 1.1. Now down to 0.8 as of 07/07/2020, although some of this may be DARA interference of course. SFLC also down.   - Patient has history of allergic reactions and HAE (was monitored 1-3 hrs after first doses, got Singulair pre-med initially) but no issues so far.   - Cont DARA-Pd   - Continue monthly MM labs    # R breast lesion: Noted on 12/2019 PET-CT, then confirmed on 04/2020 R breast US. She got the breast biopsy 07/08/2020 showed papillary lesion, recommending excisional biopsy.    - follow up  Dr. Iona Coach (local oncologist)   - if she needs surgery, recommend holding Pom/Dex    # Hereditary angioedema:   - Follows with allergist locally   - No issues with DARA-Pd    # History of PEs: Occurred while on Revlimid.   - Continue low-dose Eliquis indefinitely, particularly while on Pom    #Ppx:  - Continue acyclovir   - Continue Eliquis  - recommend flu shot  - [employer] #1, #2 done, covid ab neg  - recommend covid vaccine #3 - and then recheck the covid ab    #Follow-up: 4 weeks    Thank you for involving me in the care of your patient. Please do not hesitate to call me anytime to discuss this patient.    Kennon Rounds, MD  Assistant Clinical Professor  Hematology/Blood and Marrow Transplantation  [facility]      Problems: patient's active cancer represents a life-threatening illness  Risk of complications, morbidity/mortality of patient management: high; the patient's systemic cancer therapy requires regular and intensive monitoring for potential major/life-threatening toxicities  Data review/interpretation: I reviewed and/or ordered 3+ tests and/or documents    I performed this evaluation using real-time telehealth tools, including a live video Zoom connection between my location and the patient's  location. Prior to initiating, the patient consented to perform this evaluation using telehealth tools.

## 2020-08-04 LAB — IGG - EXTERNAL: IgG - External: 989

## 2020-08-04 LAB — EXTERNAL RESULT REPORT SCANNED

## 2020-08-04 LAB — IGA - EXTERNAL: IgA - External: 12

## 2020-08-04 LAB — SPEP - EXTERNAL: SPEP - External: 0.8

## 2020-08-04 LAB — KAPPA (SERUM, FREE) - EXTERNAL: Kappa (Serum, Free) - External: 70.6

## 2020-08-04 LAB — IGM - EXTERNAL: IgM - External: 14

## 2020-08-04 LAB — IMMUNOFIXATION ELECTROPHORESIS

## 2020-08-04 LAB — LAMBDA (SERUM, FREE) - EXTERNA: Lambda (Serum, Free) - Externa: 3.9

## 2020-08-05 LAB — "COMPREHENSIVE METABOLIC PANEL "
AST: 13 IU/L (ref 0–40)
Alanine transaminase: 21 IU/L (ref 0–32)
Albumin, Serum / Plasma: 3.7 g/dL — ABNORMAL LOW (ref 3.8–4.9)
Albumin/Globulin Ratio: 1.9 (ref 1.2–2.2)
Alkaline Phosphatase: 92 IU/L (ref 44–121)
BUN/Creatinine Ratio (External: 15 (ref 12–28)
Bilirubin, Total: 1 mg/dL (ref 0.0–1.2)
Calcium, total, Serum / Plasma: 9 mg/dL (ref 8.7–10.3)
Carbon Dioxide, Total: 23 mmol/L (ref 20–29)
Chloride, Serum / Plasma: 109 mmol/L — ABNORMAL HIGH (ref 96–106)
Creatinine: 0.79 mg/dL (ref 0.57–1.00)
Globulin, Total: 1.9 g/dL (ref 1.5–4.5)
Glucose, non-fasting: 82 mg/dL (ref 65–99)
Potassium, Serum / Plasma: 3.6 mmol/L (ref 3.5–5.2)
Protein, Total, Serum / Plasma: 5.6 g/dL — ABNORMAL LOW (ref 6.0–8.5)
Sodium, Serum / Plasma: 143 mmol/L (ref 134–144)
Urea Nitrogen, Serum / Plasma: 12 mg/dL (ref 8–27)
eGFR - high estimate: 94 mL/min/{1.73_m2}
eGFR - low estimate: 82 mL/min/{1.73_m2}

## 2020-08-05 LAB — COMPLETE BLOOD COUNT WITH DIFF
% Basophils: 2 %
% Eosinophils: 0 %
% Monocytes: 8 %
% Neutrophils: 35 %
Abs Basophils: 0.1 10*3/uL (ref 0.0–0.2)
Abs Eosinophils: 0 10*3/uL (ref 0.0–0.4)
Abs Lymphocytes: 1.4 10*3/uL (ref 0.7–3.1)
Abs Monocytes: 0.2 10*3/uL (ref 0.1–0.9)
Hematocrit: 37.3 % (ref 34.0–46.6)
Hemoglobin: 12.1 g/dL (ref 11.1–15.9)
Lymphs: 55 %
MCH: 31.3 pg (ref 26.6–33.0)
MCHC: 32.4 g/dL (ref 31.5–35.7)
MCV: 97 fL (ref 79–97)
Neutrophils Absolute: 0.9 10*3/uL — ABNORMAL LOW (ref 1.4–7.0)
Platelet Count: 212 10*3/uL (ref 150–450)
RBC Count: 3.86 x10E6/uL (ref 3.77–5.28)
RDW: 16.1 % — ABNORMAL HIGH (ref 11.7–15.4)
WBC Count: 2.6 10*3/uL — ABNORMAL LOW (ref 3.4–10.8)

## 2020-08-05 LAB — LACTATE DEHYDROGENASE, BLOOD: Lactate Dehydrogenase: 129 IU/L (ref 119–226)

## 2020-08-19 ENCOUNTER — Ambulatory Visit: Admit: 2020-08-20 | Payer: PRIVATE HEALTH INSURANCE | Attending: Physician

## 2020-08-19 DIAGNOSIS — C9001 Multiple myeloma in remission: Secondary | ICD-10-CM

## 2020-08-19 NOTE — Progress Notes (Signed)
Patient Name:  Ariel Braun    DOB:  April 18, 1961    MEDICAL RECORD NUMBER [id number]    Date of Visit: 08/19/2020     Today I saw Ariel Braun in the [facility], in consultation regarding myeloma. She has been referred internally by Dr. Eliberto Ivory. I have reviewed her medical records from [place] and will summarize them here for our records.    CHIEF COMPLAINT: Myeloma    SUMMARY OF HEMATOLOGIC HISTORY:  Ariel Braun is a 59 y.o. female who presents with myeloma. 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 M-spike 32, kappa 596.9 mg/L, IgG 4750, b2M 3.88. She was treated with VRd x4 (achieving VGPR), then MEL 200 ASCT 05/12/11, then observation alone. 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 She met criteria for PD on 02/19/2020 and was started on third-line DARA-Pd in June 2021.     INTERVAL HISTORY:   - Accompanied by daughter Ariel Braun via Zoom   - Started DARA-Pd 04/2020 (pom 48m 21/28 days starting June 30th, dara starting July 2nd), which she is tolerating well. No allergic reactions or symptoms of an HAE flare.   - Had to see dentist a few days into cycle for fevers and mouth pain, found to have infected teeth and underwent extraction x 2 and it went well. No more fevers or mouth pain.  - Did have a R breast [place](based on PET-CT showing a R breast density) done on 05/12/20 showing a 1.2cm lobulated solid mass. Biopsy done show a papillary lesion and her doc is recommending surgery. She had an appt with the surgeon to narrow down a date (10/20 v 10/02/20)  - not much side effects from the DARA/Pom/dex. Sometimes has insomnia. Has alternating/constipation. Energy level is ok but episodically tired and sleepy especially if insomnia the night before but getting better.       ALLERGIES:  Allergies/Contraindications   Allergen Reactions    Caffeine Other (See Comments)     jitters       MEDICATIONS:  Current Outpatient Medications    Medication Sig Dispense Refill    acyclovir (ZOVIRAX) 400 mg tablet TAKE 1 TABLET BY MOUTH TWICE A DAY 180 tablet 1    ALPRAZolam (XANAX) 1 mg tablet Take 1 mg by mouth daily as needed.       amoxicillin (AMOXIL) 500 mg capsule Take 4 capsules (2,000 mg total) by mouth daily as needed (1 hour prior to dental procedures) 4 capsule 3    aspirin 81 mg EC tablet Take 81 mg by mouth Daily.      azithromycin (ZITHROMAX) 500 mg tablet One po 1 hour prior to dental work 2 tablet 3    CALCIUM CITRATE/VITAMIN D3 (CALCIUM CITRATE + D ORAL) Take 2 tablets by mouth 2 (two) times daily.       cholecalciferol, vitamin D3, (VITAMIN D3) 1,000 unit CAP Take 1,000 Units by mouth 2 (two) times daily.      cholestyramine light (CHOLESTYRAMINE LIGHT) 4 gram packet Take 1 packet (4 g total) by mouth Twice a day 180 packet 3    dexAMETHasone (DECADRON) 4 mg tablet TAKE 2 TABLETS (8 MG TOTAL) BY MOUTH EVERY 7 DAYS. 24 tablet 3    diphenhydrAMINE (BENADRYL) 50 mg capsule Take 50 mg by mouth nightly as needed for Sleep.      ecallantide (KALBITOR) 10 mg/mL (1 mL) SOLN Inject  into the skin once as needed.      EPINEPHrine (EPIPEN) 0.3 mg/0.3 mL (1:1,000) injection Inject 0.3 mg into the muscle once as needed. Use as instructed      icatibant (FIRAZYR) 30 mg/3 mL SYRINGE Inject into the skin once as needed.      LORazepam (ATIVAN) 1 mg tablet Take 1 tablet (1 mg total) by mouth nightly as needed (Insomnia). 30 tablet 0    magnesium 250 mg TAB Take 1 tablet by mouth 2 (two) times daily.      metoclopramide HCl (REGLAN) 10 mg tablet Take 10 mg by mouth once as needed.      multivitamin tablet Take 1 tablet by mouth Daily.       ondansetron (ZOFRAN) 8 mg tablet Take 1 tablet (8 mg total) by mouth every 8 (eight) hours as needed. 30 tablet 2    oxyCODONE (ROXICODONE) 5 mg immediate release tablet Take 1-2 tablets (5-10 mg total) by mouth every 4 (four) hours as needed. 60 tablet 0    potassium chloride (KLOR-CON M10) 10 mEq ER  tablet TAKE 1 TABLET BY MOUTH EVERY DAY 90 tablet 1    predniSONE (DELTASONE) 20 mg tablet Take 20 mg by mouth once as needed.      REVLIMID 15 mg capsule Take 15 mg by mouth every other day.        No current facility-administered medications for this visit.       PAST MEDICAL HISTORY:  Past Medical History:   Diagnosis Date    Acid reflux disease     Anxiety     Depression     Hereditary angioedema     Multiple myeloma, without mention of having achieved remission     IgG kappa    Myeloma 05/10/2011       PAST SURGICAL HISTORY:  Past Surgical History:   Procedure Laterality Date    AUTOLOGOUS STEM CELL TRANSPLANTATION  05/12/11    BREAST CYST EXCISION  1981       FAMILY HISTORY:  Family History   Problem Relation Name Age of Onset    Diabetes Mother      Hypertension Mother      Colon cancer Father      Hypertension Brother ##Brother1        SOCIAL HISTORY:  Social History     Socioeconomic History    Marital status: Married     Spouse name: Not on file    Number of children: Not on file    Years of education: Not on file    Highest education level: Not on file   Tobacco Use    Smoking status: Never Smoker    Smokeless tobacco: Never Used   Substance and Sexual Activity    Alcohol use: No    Drug use: No    Sexual activity: Not Currently     Social Determinants of Health     Financial Resource Strain:     Difficulty of Paying Living Expenses:    Food Insecurity:     Worried About Charity fundraiser in the Last Year:     Arboriculturist in the Last Year:    Transportation Needs:     Film/video editor (Medical):     Lack of Transportation (Non-Medical):    Physical Activity:     Days of Exercise per Week:     Minutes of Exercise per Session:    Stress:     Feeling  of Stress :    Social Connections:     Frequency of Communication with Friends and Family:     Frequency of Social Gatherings with Friends and Family:     Attends Religious Services:     Active Member of Clubs or  Organizations:     Attends Music therapist:     Marital Status:    Intimate Partner Violence:     Fear of Current or Ex-Partner:     Emotionally Abused:     Physically Abused:     Sexually Abused:          PHYSICAL EXAM:  PHYSICAL EXAM VIA VIDEO VISIT:  ECOG Performance Status: 0 - Asymptomatic   Vital Signs: No vitals  General:  Awake, alert, pleasantly conversant female in no acute distress  HENT:  Normocephalic, Atraumatic, Bilateral external ears normal, No oral exudates, Nose normal. No stridor. No thrush  Eyes:  EOMI, Conjunctiva normal, No discharge  Respiratory:  Normal breathing, No respiratory distress,   Integument:  No rash on visible skin  Neurologic:  Alert & oriented x 3  Psychiatric:  Affect normal, Judgment normal, Mood normal.      LABORATORY DATA:  Prot Electrophoresis   Date Value Ref Range Status   08/03/2019 Abnormal (A) Normal pattern. 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 Paraprotein spike = 1.2 g/dL.  Interpretation by Pathologist: Clelia Schaumann, M.D.       Paraprotein Concentration   Date Value Ref Range Status   08/03/2019 1.2 (A) Negative g/dL Final     SPEP - External   Date Value Ref Range Status   08/04/2020 0.8  Final     Immunofixation Electrophoresis, serum   Date Value Ref Range Status   12/12/2018 IgG Kappa (A) Negative Final     Comment:     Paraprotein present, IgG Kappa  Interpretation by Pathologist: Clelia Schaumann, M.D.       Lab Results   Component Value Date    IgA 14 (L) 07/07/2020    IgA - External 12 08/04/2020    IgG, serum 1,490 08/03/2019    IgG - External 989 08/04/2020    IgM, serum 18 (L) 04/03/2019    IgM - External 14 08/04/2020    Kappa (Serum, Free) - External 70.6 08/04/2020    Kappa Lt Chain, Free 72.5 (H) 07/07/2020    Lambda (Serum, Free) - External 3.9 08/04/2020    Lambda Lt Chain, Free 3.9 (L) 07/07/2020    Kappa/Lambda Ratio 18.59 (H) 07/07/2020          PATHOLOGY:   As noted above.    RADIOLOGY:  As noted above.    ASSESSMENT &PLAN  The patient is a 59 y.o. female who presents for evaluation of myeloma.    # IgG kappa MM: Prior lines of therapy include (1) VRd --> MEL ASCT --> observation, (2) Rev --> Rd, and now (3) DARA-Pom/dex started 04/2020 for M-spike rising to 1.1. Now down to 0.8 as of 07/07/2020, although some of this may be DARA interference of course. SFLC also down.   - Patient has history of allergic reactions and HAE (was monitored 1-3 hrs after first doses, got Singulair pre-med initially) but no issues so far.   - Cont DARA-Pd   - Continue monthly MM labs    # R breast lesion: Noted on 12/2019 PET-CT, then confirmed on 04/2020  R breast US. She got the breast biopsy 07/08/2020 showed papillary lesion, recommending excisional biopsy.    - follow up Dr. Iona Coach (local oncologist) and Dr. Maudie Mercury (surgeon)  - recommend holding Pom/Dex 2 weeks before the surgery  - no need to hold the DARA  - stop the eliquis 48 hr before the surgery  - restart dara/pom/dex and eliquis once we got clearance from the surgeon    # Hereditary angioedema:   - Follows with allergist locally   - No issues with DARA-Pd    # History of PEs: Occurred while on Revlimid.   - Continue low-dose Eliquis indefinitely, particularly while on Pom    #Ppx:  - Continue acyclovir   - Continue Eliquis  - recommend flu shot   - [place] #1, #2 done, covid ab neg  - COVID #3 08/14/2020  - recheck covid ab test 09/13/2020    #Follow-up: 4 weeks    Thank you for involving me in the care of your patient. Please do not hesitate to call me anytime to discuss this patient.    Kennon Rounds, MD  Assistant Clinical Professor  [facility]    Cc:    Dr. Iona Coach (Heme)    Dr. Herbert Moors (Surgeon)      Problems: patient's active cancer represents a life-threatening illness  Risk of complications, morbidity/mortality of patient management: high; the patient's systemic cancer  therapy requires regular and intensive monitoring for potential major/life-threatening toxicities  Data review/interpretation: I reviewed and/or ordered 3+ tests and/or documents    I performed this evaluation using real-time telehealth tools, including a live video Zoom connection between my location and the patient's location. Prior to initiating, the patient consented to perform this evaluation using telehealth tools.

## 2020-09-03 LAB — COMPREHENSIVE METABOLIC PANEL
AST: 14 IU/L (ref 0–40)
Alanine transaminase: 17 IU/L (ref 0–32)
Albumin, Serum / Plasma: 3.9 g/dL (ref 3.8–4.9)
Albumin/Globulin Ratio: 2.1 (ref 1.2–2.2)
Alkaline Phosphatase: 83 IU/L (ref 44–121)
BUN/Creatinine Ratio (External: 24 (ref 12–28)
Bilirubin, Total: 1.3 mg/dL — ABNORMAL HIGH (ref 0.0–1.2)
Calcium, total, Serum / Plasma: 9 mg/dL (ref 8.7–10.3)
Carbon Dioxide, Total: 21 mmol/L (ref 20–29)
Chloride, Serum / Plasma: 111 mmol/L — ABNORMAL HIGH (ref 96–106)
Creatinine: 0.68 mg/dL (ref 0.57–1.00)
Globulin, Total: 1.9 g/dL (ref 1.5–4.5)
Glucose, non-fasting: 84 mg/dL (ref 65–99)
Potassium, Serum / Plasma: 3.4 mmol/L — ABNORMAL LOW (ref 3.5–5.2)
Protein, Total, Serum / Plasma: 5.8 g/dL — ABNORMAL LOW (ref 6.0–8.5)
Sodium, Serum / Plasma: 143 mmol/L (ref 134–144)
Urea Nitrogen, Serum / Plasma: 16 mg/dL (ref 8–27)
eGFR - high estimate: 110 mL/min/{1.73_m2} (ref >59)
eGFR - low estimate: 95 mL/min/{1.73_m2} (ref >59)

## 2020-09-03 LAB — COMPLETE BLOOD COUNT WITH DIFF
% Basophils: 1 %
% Eosinophils: 0 %
% Monocytes: 15 %
% Neutrophils: 41 %
Abs Basophils: 0 10*3/uL (ref 0.0–0.2)
Abs Eosinophils: 0 10*3/uL (ref 0.0–0.4)
Abs Lymphocytes: 1.7 10*3/uL (ref 0.7–3.1)
Abs Monocytes: 0.6 10*3/uL (ref 0.1–0.9)
Hematocrit: 34 % (ref 34.0–46.6)
Hemoglobin: 11.1 g/dL (ref 11.1–15.9)
Lymphs: 43 %
MCH: 30.2 pg (ref 26.6–33.0)
MCHC: 32.6 g/dL (ref 31.5–35.7)
MCV: 92 fL (ref 79–97)
Neutrophils Absolute: 1.7 10*3/uL (ref 1.4–7.0)
Platelet Count: 249 10*3/uL (ref 150–450)
RBC Count: 3.68 x10E6/uL — ABNORMAL LOW (ref 3.77–5.28)
RDW: 15.1 % (ref 11.7–15.4)
WBC Count: 4 10*3/uL (ref 3.4–10.8)

## 2020-09-03 LAB — LACTATE DEHYDROGENASE, BLOOD: Lactate Dehydrogenase: 125 IU/L (ref 119–226)

## 2020-09-03 LAB — PROTEIN ELECTROPHORESIS, SERUM
A/G Ratio: 1.1 (ref 0.7–1.7)
Albumin, SPEP: 3.2 g/dL (ref 2.9–4.4)
Alpha 1 Globulin: 0.3 g/dL (ref 0.0–0.4)
Alpha 2 Globulin: 0.6 g/dL (ref 0.4–1.0)
Beta Globulin: 1 g/dL (ref 0.7–1.3)
Gamma Globulin: 0.9 g/dL (ref 0.4–1.8)
Globulin, Total: 2.8 g/dL (ref 2.2–3.9)
M-Spike, serum: 0.8 g/dL — ABNORMAL HIGH
Protein, Total, Serum / Plasma: 6 g/dL (ref 6.0–8.5)

## 2020-09-03 LAB — KAPPA AND LAMBDA FREE LIGHT CH
Kappa Lt Chain, Free: 53.1 mg/L — ABNORMAL HIGH (ref 3.3–19.4)
Kappa/Lambda Ratio: 14.35 — ABNORMAL HIGH (ref 0.26–1.65)
Lambda Lt Chain, Free: 3.7 mg/L — ABNORMAL LOW (ref 5.7–26.3)

## 2020-09-03 LAB — IGG, SERUM: IgG: 975 mg/dL (ref 586–1602)

## 2020-09-03 LAB — IGM, SERUM: IgM: 10 mg/dL — ABNORMAL LOW (ref 26–217)

## 2020-09-03 LAB — IGA, SERUM: IgA: 14 mg/dL — ABNORMAL LOW (ref 87–352)

## 2020-09-16 ENCOUNTER — Ambulatory Visit: Admit: 2020-09-16 | Payer: PRIVATE HEALTH INSURANCE | Attending: Physician

## 2020-09-16 DIAGNOSIS — C9001 Multiple myeloma in remission: Secondary | ICD-10-CM

## 2020-09-16 NOTE — Progress Notes (Signed)
Patient Name:  Ariel Braun    DOB:  July 15, 1961    MEDICAL RECORD NUMBER [id number]    Date of Visit: 09/16/2020     Today I saw LILLA CALLEJO in the [facility], in consultation regarding myeloma. She has been referred internally by Dr. Eliberto Ivory. I have reviewed her medical records from [place] and will summarize them here for our records.    CHIEF COMPLAINT: Myeloma    SUMMARY OF HEMATOLOGIC HISTORY:  KRISINDA GIOVANNI is a 59 y.o. female who presents with myeloma. 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 M-spike 32, kappa 596.9 mg/L, IgG 4750, b2M 3.88. She was treated with VRd x4 (achieving VGPR), then MEL 200 ASCT 05/12/11, then observation alone. 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 She met criteria for PD on 02/19/2020 and was started on third-line DARA-Pd in June 2021.     INTERVAL HISTORY:   - Accompanied by daughter [name] via Zoom   - Started DARA-Pd 04/2020 (pom 44m 21/28 days starting June 30th, dara starting July 2nd), which she is tolerating well. No allergic reactions or symptoms of an HAE flare.  - Did have a R breast [place](based on PET-CT showing a R breast density) done on 05/12/20 showing a 1.2cm lobulated solid mass. Biopsy done show a papillary lesion and her doc is recommending surgery. She had an appt with the surgeon to narrow down a date (10/02/20).  - not much side effects from the DARA/Pom/dex. Sometimes has insomnia due to Dex. Energy level is ok but episodically tired and sleepy especially if insomnia the night before but getting better.   - imodium is keeping diarrhea under control which start after the pfizer vaccine.       ALLERGIES:  Allergies/Contraindications   Allergen Reactions    Caffeine Other (See Comments)     jitters       MEDICATIONS:  Current Outpatient Medications   Medication Sig Dispense Refill    acyclovir (ZOVIRAX) 400 mg tablet TAKE 1 TABLET BY MOUTH TWICE A DAY 180 tablet 1     ALPRAZolam (XANAX) 1 mg tablet Take 1 mg by mouth daily as needed.       amoxicillin (AMOXIL) 500 mg capsule Take 4 capsules (2,000 mg total) by mouth daily as needed (1 hour prior to dental procedures) 4 capsule 3    aspirin 81 mg EC tablet Take 81 mg by mouth Daily.      azithromycin (ZITHROMAX) 500 mg tablet One po 1 hour prior to dental work 2 tablet 3    CALCIUM CITRATE/VITAMIN D3 (CALCIUM CITRATE + D ORAL) Take 2 tablets by mouth 2 (two) times daily.       cholecalciferol, vitamin D3, (VITAMIN D3) 1,000 unit CAP Take 1,000 Units by mouth 2 (two) times daily.      cholestyramine light (CHOLESTYRAMINE LIGHT) 4 gram packet Take 1 packet (4 g total) by mouth Twice a day 180 packet 3    dexAMETHasone (DECADRON) 4 mg tablet TAKE 2 TABLETS (8 MG TOTAL) BY MOUTH EVERY 7 DAYS. 24 tablet 3    diphenhydrAMINE (BENADRYL) 50 mg capsule Take 50 mg by mouth nightly as needed for Sleep.      ecallantide (KALBITOR) 10 mg/mL (1 mL) SOLN Inject into the skin once as needed.      EPINEPHrine (EPIPEN) 0.3 mg/0.3 mL (1:1,000) injection Inject 0.3 mg into the  muscle once as needed. Use as instructed      icatibant (FIRAZYR) 30 mg/3 mL SYRINGE Inject into the skin once as needed.      LORazepam (ATIVAN) 1 mg tablet Take 1 tablet (1 mg total) by mouth nightly as needed (Insomnia). 30 tablet 0    magnesium 250 mg TAB Take 1 tablet by mouth 2 (two) times daily.      metoclopramide HCl (REGLAN) 10 mg tablet Take 10 mg by mouth once as needed.      multivitamin tablet Take 1 tablet by mouth Daily.       ondansetron (ZOFRAN) 8 mg tablet Take 1 tablet (8 mg total) by mouth every 8 (eight) hours as needed. 30 tablet 2    oxyCODONE (ROXICODONE) 5 mg immediate release tablet Take 1-2 tablets (5-10 mg total) by mouth every 4 (four) hours as needed. 60 tablet 0    potassium chloride (KLOR-CON M10) 10 mEq ER tablet TAKE 1 TABLET BY MOUTH EVERY DAY 90 tablet 1    predniSONE (DELTASONE) 20 mg tablet Take 20 mg by mouth once as  needed.      REVLIMID 15 mg capsule Take 15 mg by mouth every other day.        No current facility-administered medications for this visit.       PAST MEDICAL HISTORY:  Past Medical History:   Diagnosis Date    Acid reflux disease     Anxiety     Depression     Hereditary angioedema     Multiple myeloma, without mention of having achieved remission     IgG kappa    Myeloma 05/10/2011       PAST SURGICAL HISTORY:  Past Surgical History:   Procedure Laterality Date    AUTOLOGOUS STEM CELL TRANSPLANTATION  05/12/11    BREAST CYST EXCISION  1981       FAMILY HISTORY:  Family History   Problem Relation Name Age of Onset    Diabetes Mother      Hypertension Mother      Colon cancer Father      Hypertension Brother ##Brother1        SOCIAL HISTORY:  Social History     Socioeconomic History    Marital status: Married     Spouse name: Not on file    Number of children: Not on file    Years of education: Not on file    Highest education level: Not on file   Tobacco Use    Smoking status: Never Smoker    Smokeless tobacco: Never Used   Substance and Sexual Activity    Alcohol use: No    Drug use: No    Sexual activity: Not Currently     Social Determinants of Health     Financial Resource Strain:     Difficulty of Paying Living Expenses:    Food Insecurity:     Worried About Charity fundraiser in the Last Year:     Arboriculturist in the Last Year:    Transportation Needs:     Film/video editor (Medical):     Lack of Transportation (Non-Medical):    Physical Activity:     Days of Exercise per Week:     Minutes of Exercise per Session:    Stress:     Feeling of Stress :    Social Connections:     Frequency of Communication with Friends and Family:  Frequency of Social Gatherings with Friends and Family:     Attends Religious Services:     Active Member of Clubs or Organizations:     Attends Music therapist:     Marital Status:    Intimate Partner Violence:     Fear of  Current or Ex-Partner:     Emotionally Abused:     Physically Abused:     Sexually Abused:          PHYSICAL EXAM:  PHYSICAL EXAM VIA VIDEO VISIT:  ECOG Performance Status: 0 - Asymptomatic   Vital Signs: No vitals  General:  Awake, alert, pleasantly conversant female in no acute distress  HENT:  Normocephalic, Atraumatic, Bilateral external ears normal, No oral exudates, Nose normal. No stridor. No thrush  Eyes:  EOMI, Conjunctiva normal, No discharge  Respiratory:  Normal breathing, No respiratory distress,   Integument:  No rash on visible skin  Neurologic:  Alert & oriented x 3  Psychiatric:  Affect normal, Judgment normal, Mood normal.      LABORATORY DATA:  Prot Electrophoresis   Date Value Ref Range Status   08/03/2019 Abnormal (A) Normal pattern. 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 Paraprotein spike = 1.2 g/dL.  Interpretation by Pathologist: Clelia Schaumann, M.D.       Paraprotein Concentration   Date Value Ref Range Status   08/03/2019 1.2 (A) Negative g/dL Final     SPEP - External   Date Value Ref Range Status   08/04/2020 0.8  Final     Immunofixation Electrophoresis, serum   Date Value Ref Range Status   12/12/2018 IgG Kappa (A) Negative Final     Comment:     Paraprotein present, IgG Kappa  Interpretation by Pathologist: Clelia Schaumann, M.D.       Lab Results   Component Value Date    IgA 14 (L) 09/02/2020    IgA - External 12 08/04/2020    IgG, serum 1,490 08/03/2019    IgG - External 989 08/04/2020    IgM, serum 18 (L) 04/03/2019    IgM - External 14 08/04/2020    Kappa (Serum, Free) - External 70.6 08/04/2020    Kappa Lt Chain, Free 53.1 (H) 09/02/2020    Lambda (Serum, Free) - External 3.9 08/04/2020    Lambda Lt Chain, Free 3.7 (L) 09/02/2020    Kappa/Lambda Ratio 14.35 (H) 09/02/2020          PATHOLOGY:  As noted above.    RADIOLOGY:  As noted above.    ASSESSMENT &PLAN  The patient is a 59 y.o. female who presents for  evaluation of myeloma.    # IgG kappa MM: Prior lines of therapy include (1) VRd --> MEL ASCT --> observation, (2) Rev --> Rd, and now (3) DARA-Pom/dex started 04/2020 for M-spike rising to 1.1. Now down to 0.8 as of 07/07/2020, although some of this may be DARA interference of course. SFLC also down further.   - Patient has history of allergic reactions and HAE (was monitored 1-3 hrs after first doses, got Singulair pre-med initially) but no issues so far.   - Cont DARA-Pd   - Continue monthly MM labs    # R breast lesion: Noted on 12/2019 PET-CT, then confirmed on 04/2020 R breast US. She got the breast biopsy 07/08/2020 showed papillary lesion, recommending excisional biopsy.    - follow up Dr.  Johl (local oncologist) and Dr. Maudie Mercury (surgeon)  - recommend holding Pom/Dex 2 weeks before the surgery (last dose 09/17/2020)  - no need to hold the DARA  - stop the eliquis 48 hr before the surgery - last dose on 11/15  - restart dara/pom/dex and eliquis once we got clearance from the surgeon    # Hereditary angioedema:   - Follows with allergist locally   - No issues with DARA-Pd    # History of PEs: Occurred while on Revlimid.   - Continue low-dose Eliquis indefinitely, particularly while on Pom    #Ppx:  - Continue acyclovir   - Continue Eliquis  - recommend flu shot - do it after the surgery  - [place] #1, #2 done, covid ab neg  - COVID #3 08/14/2020  - recheck covid ab test 09/13/2020    #Follow-up: 4 weeks    Thank you for involving me in the care of your patient. Please do not hesitate to call me anytime to discuss this patient.    Kennon Rounds, MD  Assistant Clinical Professor  Hematology/Blood and Marrow Transplantation  [facility]    Cc:    Dr. Iona Coach (Heme)    Dr. Herbert Moors (Surgeon)      Problems: patient's active cancer represents a life-threatening illness  Risk of complications, morbidity/mortality of patient management: high; the patient's systemic cancer therapy requires regular and intensive  monitoring for potential major/life-threatening toxicities  Data review/interpretation: I reviewed and/or ordered 3+ tests and/or documents    I performed this evaluation using real-time telehealth tools, including a live video Zoom connection between my location and the patient's location. Prior to initiating, the patient consented to perform this evaluation using telehealth tools.

## 2020-09-16 NOTE — Patient Instructions (Addendum)
recommend holding Pom/Dex 2 weeks before the surgery (last dose 09/17/2020)    stop the eliquis 48 hr before the surgery - last dose on 11/15    recommend flu shot - do it after the surgery    recheck covid ab test     Wait till you get clearance from the surgeon before restarting dara/pom/dex and eliquis    1 month follow up with me

## 2020-10-14 MED ORDER — APIXABAN 5 MG TABLET
5 | ORAL | 2.00 refills | 30.00000 days | Status: AC
Start: 2020-10-14 — End: 2022-01-09

## 2020-10-14 MED ORDER — LORAZEPAM 1 MG TABLET
1 | ORAL | 0.00 refills | 10.00000 days | Status: AC | PRN
Start: 2020-10-14 — End: 2022-01-09

## 2020-10-14 MED ORDER — POMALIDOMIDE 2 MG CAPSULE
2 | ORAL | Status: AC
Start: 2020-10-14 — End: 2022-01-09

## 2020-10-16 LAB — IGM, SERUM: IgM: 10 mg/dL — ABNORMAL LOW (ref 26–217)

## 2020-10-16 LAB — LACTATE DEHYDROGENASE, BLOOD: Lactate Dehydrogenase: 156 IU/L (ref 119–226)

## 2020-10-16 LAB — PROTEIN ELECTROPHORESIS, SERUM
A/G Ratio: 1.1 (ref 0.7–1.7)
Albumin, sPEP: 3.5 g/dL (ref 2.9–4.4)
Alpha 1 Globulin: 0.3 g/dL (ref 0.0–0.4)
Alpha 2 Globulin: 0.7 g/dL (ref 0.4–1.0)
Beta Globulin: 1.1 g/dL (ref 0.7–1.3)
Gamma Globulin: 1.1 g/dL (ref 0.4–1.8)
Globulin, Total: 3.2 g/dL (ref 2.2–3.9)
M-Protein (monoclonal), Serum: 1 g/dL — ABNORMAL HIGH
Protein, Total, Serum / Plasma: 6.7 g/dL (ref 6.0–8.5)

## 2020-10-16 LAB — COMPREHENSIVE METABOLIC PANEL
AST: 25 IU/L (ref 0–40)
Alanine transaminase: 29 IU/L (ref 0–32)
Albumin, Serum / Plasma: 4.2 g/dL (ref 3.8–4.9)
Albumin/Globulin Ratio: 1.9 (ref 1.2–2.2)
Alkaline Phosphatase: 109 IU/L (ref 44–121)
BUN/Creatinine Ratio (External: 16 (ref 12–28)
Bilirubin, Total: 0.8 mg/dL (ref 0.0–1.2)
Calcium, total, Serum / Plasma: 9.2 mg/dL (ref 8.7–10.3)
Carbon Dioxide, Total: 19 mmol/L — ABNORMAL LOW (ref 20–29)
Chloride, Serum / Plasma: 108 mmol/L — ABNORMAL HIGH (ref 96–106)
Creatinine: 0.74 mg/dL (ref 0.57–1.00)
Globulin, Total: 2.2 g/dL (ref 1.5–4.5)
Glucose, non-fasting: 92 mg/dL (ref 65–99)
Potassium, Serum / Plasma: 3.5 mmol/L (ref 3.5–5.2)
Protein, Total, Serum / Plasma: 6.4 g/dL (ref 6.0–8.5)
Sodium, Serum / Plasma: 142 mmol/L (ref 134–144)
Urea Nitrogen, Serum / Plasma: 12 mg/dL (ref 8–27)
eGFR - high estimate: 102 mL/min/{1.73_m2} (ref >59)
eGFR - low estimate: 88 mL/min/{1.73_m2} (ref >59)

## 2020-10-16 LAB — COMPLETE BLOOD COUNT WITH DIFF
% Basophils: 1 %
% Eosinophils: 2 %
% Monocytes: 11 %
% Neutrophils: 52 %
Abs Basophils: 0 10*3/uL (ref 0.0–0.2)
Abs Eosinophils: 0.1 10*3/uL (ref 0.0–0.4)
Abs Lymphocytes: 1.2 10*3/uL (ref 0.7–3.1)
Abs Monocytes: 0.4 10*3/uL (ref 0.1–0.9)
Hematocrit: 39.7 % (ref 34.0–46.6)
Hemoglobin: 12.9 g/dL (ref 11.1–15.9)
Lymphs: 34 %
MCH: 29.9 pg (ref 26.6–33.0)
MCHC: 32.5 g/dL (ref 31.5–35.7)
MCV: 92 fL (ref 79–97)
Neutrophils Absolute: 1.9 10*3/uL (ref 1.4–7.0)
Platelet Count: 177 10*3/uL (ref 150–450)
RBC Count: 4.31 x10E6/uL (ref 3.77–5.28)
RDW: 14.1 % (ref 11.7–15.4)
WBC Count: 3.6 10*3/uL (ref 3.4–10.8)

## 2020-10-16 LAB — IGG, SERUM: IgG: 1152 mg/dL (ref 586–1602)

## 2020-10-16 LAB — KAPPA AND LAMBDA FREE LIGHT CH
Kappa/Lambda Ratio: 41.22 — ABNORMAL HIGH (ref 0.26–1.65)
Lambda Lt Chain, Free: 2.7 mg/L — ABNORMAL LOW (ref 5.7–26.3)

## 2020-10-16 LAB — KAPPA AND LAMBDA FREE LIGHT CHAINS, SERUM: Kappa Light Chain, Serum, Free: 111.3 mg/L — ABNORMAL HIGH (ref 3.3–19.4)

## 2020-10-16 LAB — IGA, SERUM: IgA, Serum: 13 mg/dL — ABNORMAL LOW (ref 87–352)

## 2020-10-21 ENCOUNTER — Ambulatory Visit: Admit: 2020-10-22 | Payer: PRIVATE HEALTH INSURANCE | Attending: Physician

## 2020-10-21 DIAGNOSIS — C9000 Multiple myeloma not having achieved remission: Secondary | ICD-10-CM

## 2020-10-21 NOTE — Progress Notes (Signed)
Patient Name:  Ariel Braun    DOB:  1961-04-19    MEDICAL RECORD NUMBER [id number]    Date of Visit: 10/21/2020     Today I saw Ariel Braun in the [facility] Hematology/BMT Clinic, in consultation regarding myeloma. She has been referred internally by Dr. Eliberto Ivory. I have reviewed her medical records from [place] and will summarize them here for our records.    CHIEF COMPLAINT: Myeloma    SUMMARY OF HEMATOLOGIC HISTORY:  Ariel Braun is a 59 y.o. female who presents with myeloma. 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 M-spike 32, kappa 596.9 mg/L, IgG 4750, b2M 3.88. She was treated with VRd x4 (achieving VGPR), then MEL 200 ASCT 05/12/11, then observation alone. 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 She met criteria for PD on 02/19/2020 and was started on third-line DARA-Pd in June 2021.     INTERVAL HISTORY:   - Accompanied by daughter Ariel Braun via Zoom   - Started DARA-Pd 04/2020 (pom 51m 21/28 days starting June 30th, dara starting July 2nd), which she is tolerating well. No allergic reactions or symptoms of an HAE flare.  - Did have a R breast [place](based on PET-CT showing a R breast density) done on 05/12/20 showing a 1.2cm lobulated solid mass. Biopsy done show a papillary lesion and her doc is recommending surgery. She had an appt with the surgeon to narrow down a date (10/02/20).  - she celebrated thanksgiving. Surgery on 10/02/20 went really well. Healing well also. Saw Dr. KMaudie Mercurywho thinks she could restart chemo. She re-started on 10/17/2020 DARA/Pd. She has some insomnia and tiredness.       ALLERGIES:  Allergies/Contraindications   Allergen Reactions    Caffeine Other (See Comments)     jitters       MEDICATIONS:  Current Outpatient Medications   Medication Sig Dispense Refill    acyclovir (ZOVIRAX) 400 mg tablet TAKE 1 TABLET BY MOUTH TWICE A DAY 180 tablet 1    amoxicillin (AMOXIL) 500 mg capsule Take 4 capsules (2,000 mg total) by  mouth daily as needed (1 hour prior to dental procedures) 4 capsule 3    apixaban (ELIQUIS) 5 mg tablet Take 5 mg by mouth 2 (two) times daily      cholestyramine light (CHOLESTYRAMINE LIGHT) 4 gram packet Take 1 packet (4 g total) by mouth Twice a day 180 packet 3    dexAMETHasone (DECADRON) 4 mg tablet TAKE 2 TABLETS (8 MG TOTAL) BY MOUTH EVERY 7 DAYS. (Patient taking differently: Take 12 mg by mouth every 7 (seven) days   ) 24 tablet 3    ecallantide (KALBITOR) 10 mg/mL (1 mL) SOLN Inject into the skin once as needed.      icatibant (FIRAZYR) 30 mg/3 mL SYRINGE Inject into the skin once as needed.      LORazepam (ATIVAN) 1 mg tablet Take 1 mg by mouth every 6 (six) hours as needed      multivitamin tablet Take 1 tablet by mouth Daily.       pomalidomide 2 mg CAP Take 2 mg by mouth daily 21/28 days        EPINEPHrine (EPIPEN) 0.3 mg/0.3 mL (1:1,000) injection Inject 0.3 mg into the muscle once as needed. Use as instructed (Patient not taking: Reported on 10/14/2020  )      metoclopramide HCl (REGLAN) 10 mg tablet Take  10 mg by mouth once as needed. (Patient not taking: Reported on 10/14/2020  )      ondansetron (ZOFRAN) 8 mg tablet Take 1 tablet (8 mg total) by mouth every 8 (eight) hours as needed. (Patient not taking: Reported on 10/14/2020  ) 30 tablet 2    predniSONE (DELTASONE) 20 mg tablet Take 20 mg by mouth once as needed. (Patient not taking: Reported on 10/14/2020  )       No current facility-administered medications for this visit.       PAST MEDICAL HISTORY:  Past Medical History:   Diagnosis Date    Acid reflux disease     Anxiety     Depression     Hereditary angioedema     Multiple myeloma, without mention of having achieved remission     IgG kappa    Myeloma 05/10/2011       PAST SURGICAL HISTORY:  Past Surgical History:   Procedure Laterality Date    AUTOLOGOUS STEM CELL TRANSPLANTATION  05/12/11    BREAST CYST EXCISION  1981       FAMILY HISTORY:  Family History   Problem  Relation Name Age of Onset    Diabetes Mother      Hypertension Mother      Colon cancer Father      Hypertension Brother ##Brother1        SOCIAL HISTORY:  Social History     Socioeconomic History    Marital status: Married   Tobacco Use    Smoking status: Never Smoker    Smokeless tobacco: Never Used   Substance and Sexual Activity    Alcohol use: No    Drug use: No    Sexual activity: Not Currently         PHYSICAL EXAM:  PHYSICAL EXAM VIA VIDEO VISIT:  ECOG Performance Status: 0 - Asymptomatic   Vital Signs: No vitals  General:  Awake, alert, pleasantly conversant female in no acute distress  HENT:  Normocephalic, Atraumatic, Bilateral external ears normal, No oral exudates, Nose normal. No stridor. No thrush  Eyes:  EOMI, Conjunctiva normal, No discharge  Respiratory:  Normal breathing, No respiratory distress,   Integument:  No rash on visible skin  Neurologic:  Alert & oriented x 3  Psychiatric:  Affect normal, Judgment normal, Mood normal.      LABORATORY DATA:  Prot Electrophoresis   Date Value Ref Range Status   08/03/2019 Abnormal (A) Normal pattern. 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 Paraprotein spike = 1.2 g/dL.  Interpretation by Pathologist: Clelia Schaumann, M.D.       Paraprotein Concentration   Date Value Ref Range Status   08/03/2019 1.2 (A) Negative g/dL Final     SPEP - External   Date Value Ref Range Status   08/04/2020 0.8  Final     Immunofixation Electrophoresis, serum   Date Value Ref Range Status   12/12/2018 IgG Kappa (A) Negative Final     Comment:     Paraprotein present, IgG Kappa  Interpretation by Pathologist: Clelia Schaumann, M.D.       Lab Results   Component Value Date    IgA 13 (L) 10/15/2020    IgA - External 12 08/04/2020    IgG, serum 1,490 08/03/2019    IgG - External 989 08/04/2020    IgM, serum 18 (L) 04/03/2019    IgM -  External 14 08/04/2020    Kappa (Serum, Free) - External 70.6 08/04/2020     Kappa Lt Chain, Free 111.3 (H) 10/15/2020    Lambda (Serum, Free) - External 3.9 08/04/2020    Lambda Lt Chain, Free 2.7 (L) 10/15/2020    Kappa/Lambda Ratio 41.22 (H) 10/15/2020          PATHOLOGY:  As noted above.    RADIOLOGY:  As noted above.    ASSESSMENT &PLAN  The patient is a 59 y.o. female who presents for evaluation of myeloma.    # IgG kappa MM: Prior lines of therapy include (1) VRd --> MEL ASCT --> observation, (2) Rev --> Rd, and now (3) DARA-Pom/dex started 04/2020 for M-spike rising to 1.1. Now down to 0.8 as of 07/07/2020, although some of this may be DARA interference of course. SFLC then down further. Then she had to hold treatment for surgery starting 09/17/2020 and now as of 10/15/2020 her [facility] labs have come up.    - Patient has history of allergic reactions and HAE (was monitored 1-3 hrs after first doses, got Singulair pre-med initially) but no issues so far.   - Cont DARA-Pd  DARA every other week  Resume Pom 39m daily, 21 of 28 days  Dex once weekly, 21 of 28 days   - Continue monthly MM labs - recheck MM labs   [facility] make sure it is going in the right direction    # R breast lesion: Noted on 12/2019 PET-CT, then confirmed on 04/2020 R breast [place] She got the breast biopsy 07/08/2020 showed papillary lesion, recommending excisional biopsy which returned as benign, per patient   - follow up Dr. JIona Coach(local oncologist) and Dr. KMaudie Mercury(surgeon)    # Hereditary angioedema:   - Follows with allergist locally   - No issues with DARA-Pd    # History of PEs: Occurred while on Revlimid.   - Continue low-dose Eliquis indefinitely, particularly while on Pom    #Ppx:  - Continue acyclovir   - Continue Eliquis  - recommend flu shot  - [place], #2 done, covid ab neg  - COVID #3 08/14/2020  - recheck covid ab test 09/13/2020    #Follow-up: 4 weeks    Thank you for involving me in the care of your patient. Please do not hesitate to call me anytime to discuss  this patient.    SKennon Rounds MD  Assistant Clinical Professor  Hematology/Blood and Marrow Transplantation  [facility]    Cc:    Dr. JIona Coach(Heme)    Dr. CHerbert Moors(Surgeon)      Problems: patient's active cancer represents a life-threatening illness  Risk of complications, morbidity/mortality of patient management: high; the patient's systemic cancer therapy requires regular and intensive monitoring for potential major/life-threatening toxicities  Data review/interpretation: I reviewed and/or ordered 3+ tests and/or documents    I performed this evaluation using real-time telehealth tools, including a live video Zoom connection between my location and the patient's location. Prior to initiating, the patient consented to perform this evaluation using telehealth tools.

## 2020-10-31 MED ORDER — ACYCLOVIR 400 MG TABLET
400 mg | ORAL_TABLET | Freq: Two times a day (BID) | ORAL | 3 refills | Status: AC
Start: 2020-10-31 — End: 2022-01-11

## 2020-11-07 LAB — PHOSPHORUS, SERUM / PLASMA: Phosphorus, Serum / Plasma: 3.1 mg/dL (ref 3.0–4.3)

## 2020-11-07 LAB — COMPLETE BLOOD COUNT WITH DIFFERENTIAL
% Eosinophils: 6 %
% Monocytes: 16 %
Abs Basophils: 0 10*3/uL (ref 0.0–0.2)
Abs Eosinophils: 0.1 10*3/uL (ref 0.0–0.4)
Abs Lymphocytes: 0.9 10*3/uL (ref 0.7–3.1)
Hemoglobin: 13.2 g/dL (ref 11.1–15.9)
Lymphs: 47 %
MCH: 29.7 pg (ref 26.6–33.0)
MCV: 89 fL (ref 79–97)
Neutrophils Absolute: 0.6 10*3/uL — ABNORMAL LOW (ref 1.4–7.0)
Platelet Count: 195 10*3/uL (ref 150–450)

## 2020-11-07 LAB — COMPLETE BLOOD COUNT WITH DIFF
% Basophils: 2 %
% Neutrophils: 29 %
Abs Monocytes: 0.3 10*3/uL (ref 0.1–0.9)
Hematocrit: 39.4 % (ref 34.0–46.6)
MCHC: 33.5 g/dL (ref 31.5–35.7)
RBC Count: 4.45 x10E6/uL (ref 3.77–5.28)
RDW: 14.6 % (ref 11.7–15.4)
WBC Count: 2 10*3/uL — CL (ref 3.4–10.8)

## 2020-11-07 LAB — COMPREHENSIVE METABOLIC PANEL (BMP, AST, ALT, T.BILI, ALKP, TP ALB)
AST: 14 [IU]/L (ref 0–40)
Alanine transaminase: 24 [IU]/L (ref 0–32)
Alkaline Phosphatase: 110 [IU]/L (ref 44–121)
Bilirubin, Total: 1.4 mg/dL — ABNORMAL HIGH (ref 0.0–1.2)
Carbon Dioxide, Total: 20 mmol/L (ref 20–29)
Chloride, Serum / Plasma: 107 mmol/L — ABNORMAL HIGH (ref 96–106)
Creatinine: 0.75 mg/dL (ref 0.57–1.00)
Glucose, non-fasting: 95 mg/dL (ref 65–99)
Potassium, Serum / Plasma: 3.9 mmol/L (ref 3.5–5.2)
Protein, Total, Serum / Plasma: 5.9 g/dL — ABNORMAL LOW (ref 6.0–8.5)
Sodium, Serum / Plasma: 141 mmol/L (ref 134–144)
eGFR - high estimate: 100 mL/min/{1.73_m2} (ref >59)
eGFR - low estimate: 87 mL/min/{1.73_m2} (ref >59)

## 2020-11-07 LAB — COMPREHENSIVE METABOLIC PANEL
Albumin, Serum / Plasma: 4 g/dL (ref 3.8–4.9)
Albumin/Globulin Ratio: 2.1 (ref 1.2–2.2)
BUN/Creatinine Ratio (External: 17 (ref 12–28)
Calcium, total, Serum / Plasma: 9 mg/dL (ref 8.7–10.3)
Globulin, Total: 1.9 g/dL (ref 1.5–4.5)
Urea Nitrogen, Serum / Plasma: 13 mg/dL (ref 8–27)

## 2020-11-07 LAB — URIC ACID, SERUM / PLASMA: Uric Acid, Serum / Plasma: 3.8 mg/dL (ref 3.0–7.2)

## 2020-11-07 LAB — LACTATE DEHYDROGENASE, BLOOD: Lactate Dehydrogenase: 115 IU/L — ABNORMAL LOW (ref 119–226)

## 2020-11-11 LAB — IMMUNOFIXATION ELECTROPHORESIS
IgA: 14 mg/dL — ABNORMAL LOW (ref 87–352)
IgG: 938 mg/dL (ref 586–1602)

## 2020-11-11 LAB — KAPPA AND LAMBDA FREE LIGHT CHAINS, SERUM: Lambda Light Chain, Serum, Free: 3.7 mg/L — ABNORMAL LOW (ref 5.7–26.3)

## 2020-11-11 LAB — PROTEIN ELECTROPHORESIS, SERUM
A/G Ratio: 1.1 (ref 0.7–1.7)
Albumin, SPEP: 3.1 g/dL (ref 2.9–4.4)
Alpha 1 Globulin: 0.3 g/dL (ref 0.0–0.4)
Alpha 2 Globulin: 0.7 g/dL (ref 0.4–1.0)
Beta Globulin: 1 g/dL (ref 0.7–1.3)
Gamma Globulin: 0.8 g/dL (ref 0.4–1.8)
Globulin, Total: 2.8 g/dL (ref 2.2–3.9)
M-Spike, serum: 0.7 g/dL — ABNORMAL HIGH
Protein, Total, Serum / Plasma: 5.9 g/dL — ABNORMAL LOW (ref 6.0–8.5)

## 2020-11-11 LAB — SARS-COV-2 ANTIBODY (IGG), SPIKE, SEMI-QUANTITATIVE: Interpretation: POSITIVE

## 2020-11-11 LAB — KAPPA AND LAMBDA FREE LIGHT CH
Kappa Lt Chain, Free: 41.2 mg/L — ABNORMAL HIGH (ref 3.3–19.4)
Kappa/Lambda Ratio: 11.14 — ABNORMAL HIGH (ref 0.26–1.65)

## 2020-11-11 LAB — SARS-COV-2 ANTIBODY (IGG), SPI: Sars-CoV-2 Semi-Quant Total AB: 2.1 U/mL (ref <0.8)

## 2020-11-11 LAB — IMMUNOFIXATION ELECTROPHORESIS, SERUM: IgM, Serum: 11 mg/dL — ABNORMAL LOW (ref 26–217)

## 2020-12-05 ENCOUNTER — Ambulatory Visit: Admit: 2020-12-06 | Payer: PRIVATE HEALTH INSURANCE | Attending: Physician

## 2020-12-05 DIAGNOSIS — C9000 Multiple myeloma not having achieved remission: Secondary | ICD-10-CM

## 2020-12-05 NOTE — Progress Notes (Signed)
Patient Name:  Ariel Braun    DOB:  Feb 12, 1961    MEDICAL RECORD NUMBER [id number]    Date of Visit: 12/05/2020     Today I saw Ariel Braun in the [facility] Hematology/BMT Clinic, in consultation regarding myeloma. She has been referred internally by Dr. Eliberto Ivory. I have reviewed her medical records from [place] and will summarize them here for our records.    CHIEF COMPLAINT: Myeloma    SUMMARY OF HEMATOLOGIC HISTORY:  Ariel Braun is a 60 y.o. female who presents with myeloma. 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 M-spike 32, kappa 596.9 mg/L, IgG 4750, b2M 3.88. She was treated with VRd x4 (achieving VGPR), then MEL 200 ASCT 05/12/11, then observation alone. 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 She met criteria for PD on 02/19/2020 and was started on third-line DARA-Pd in June 2021.     INTERVAL HISTORY:   - Accompanied by daughter Ariel Braun via Zoom   - Started DARA-Pd 04/2020 (pom 44m 21/28 days starting June 30th, dara starting July 2nd), which she is tolerating well. No allergic reactions or symptoms of an HAE flare.   - Has recovered well from her R breast surgery on 10/02/20 (path negative)   - Issues currently include problems sleeping and constipation. She attributes her insomnia to anxiety about her disease, her son (who is intellectually disabled and has hyperparathyroidism with several kidney stones), and fears of COVID.   - She is the sole caregiver to her husband (diabetes) and son (as above), and unfortunately her family and church have not been as supportive as she had hoped. This has been very emotionally taxing for her.      ALLERGIES:  Allergies/Contraindications   Allergen Reactions    Caffeine Other (See Comments)     jitters       MEDICATIONS:  Current Outpatient Medications   Medication Sig Dispense Refill    acyclovir (ZOVIRAX) 400 mg tablet Take 1 tablet (400 mg total) by mouth Twice a day 180 tablet 3     amoxicillin (AMOXIL) 500 mg capsule Take 4 capsules (2,000 mg total) by mouth daily as needed (1 hour prior to dental procedures) 4 capsule 3    apixaban (ELIQUIS) 5 mg tablet Take 5 mg by mouth 2 (two) times daily      cholestyramine light (CHOLESTYRAMINE LIGHT) 4 gram packet Take 1 packet (4 g total) by mouth Twice a day 180 packet 3    dexAMETHasone (DECADRON) 4 mg tablet TAKE 2 TABLETS (8 MG TOTAL) BY MOUTH EVERY 7 DAYS. (Patient taking differently: Take 12 mg by mouth every 7 (seven) days   ) 24 tablet 3    ecallantide (KALBITOR) 10 mg/mL (1 mL) SOLN Inject into the skin once as needed.      EPINEPHrine (EPIPEN) 0.3 mg/0.3 mL (1:1,000) injection Inject 0.3 mg into the muscle once as needed. Use as instructed (Patient not taking: Reported on 10/14/2020  )      icatibant (FIRAZYR) 30 mg/3 mL SYRINGE Inject into the skin once as needed.      LORazepam (ATIVAN) 1 mg tablet Take 1 mg by mouth every 6 (six) hours as needed      metoclopramide HCl (REGLAN) 10 mg tablet Take 10 mg by mouth once as needed. (Patient not taking: Reported on 10/14/2020  )      multivitamin tablet Take 1 tablet  by mouth Daily.       ondansetron (ZOFRAN) 8 mg tablet Take 1 tablet (8 mg total) by mouth every 8 (eight) hours as needed. (Patient not taking: Reported on 10/14/2020  ) 30 tablet 2    pomalidomide 2 mg CAP Take 2 mg by mouth daily 21/28 days        predniSONE (DELTASONE) 20 mg tablet Take 20 mg by mouth once as needed. (Patient not taking: Reported on 10/14/2020  )       No current facility-administered medications for this visit.       PAST MEDICAL HISTORY:  Past Medical History:   Diagnosis Date    Acid reflux disease     Anxiety     Depression     Hereditary angioedema     Multiple myeloma, without mention of having achieved remission     IgG kappa    Myeloma 05/10/2011       PAST SURGICAL HISTORY:  Past Surgical History:   Procedure Laterality Date    AUTOLOGOUS STEM CELL TRANSPLANTATION  05/12/11    BREAST  CYST EXCISION  1981       FAMILY HISTORY:  Family History   Problem Relation Name Age of Onset    Diabetes Mother      Hypertension Mother      Colon cancer Father      Hypertension Brother ##Brother1        SOCIAL HISTORY:  Social History     Socioeconomic History    Marital status: Married   Tobacco Use    Smoking status: Never Smoker    Smokeless tobacco: Never Used   Substance and Sexual Activity    Alcohol use: No    Drug use: No    Sexual activity: Not Currently       PHYSICAL EXAM:  PHYSICAL EXAM VIA VIDEO VISIT:  ECOG Performance Status: 1 - Symptomatic but completely ambulatory   Vital Signs: No vitals  General:  Awake, alert, pleasantly conversant female in no acute distress  HENT:  Normocephalic, Atraumatic, Bilateral external ears normal, No oral exudates, Nose normal. No stridor. No thrush  Eyes:  EOMI, Conjunctiva normal, No discharge  Respiratory:  Normal breathing, No respiratory distress,   Integument:  No rash on visible skin  Neurologic:  Alert & oriented x 3  Psychiatric:  Affect normal, Judgment normal, openly tearful      LABORATORY DATA:  Prot Electrophoresis   Date Value Ref Range Status   08/03/2019 Abnormal (A) Normal pattern. 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 Paraprotein spike = 1.2 g/dL.  Interpretation by Pathologist: Clelia Schaumann, M.D.       Paraprotein Concentration   Date Value Ref Range Status   08/03/2019 1.2 (A) Negative g/dL Final     SPEP - External   Date Value Ref Range Status   08/04/2020 0.8  Final     Immunofixation Electrophoresis, serum   Date Value Ref Range Status   12/12/2018 IgG Kappa (A) Negative Final     Comment:     Paraprotein present, IgG Kappa  Interpretation by Pathologist: Clelia Schaumann, M.D.       Lab Results   Component Value Date    IgA 14 (L) 11/06/2020    IgA - External 12 08/04/2020    IgG, serum 1,490 08/03/2019    IgG - External 989 08/04/2020    IgM,  serum 18 (L)  04/03/2019    IgM - External 14 08/04/2020    Kappa (Serum, Free) - External 70.6 08/04/2020    Kappa Lt Chain, Free 41.2 (H) 11/06/2020    Lambda (Serum, Free) - External 3.9 08/04/2020    Lambda Lt Chain, Free 3.7 (L) 11/06/2020    Kappa/Lambda Ratio 11.14 (H) 11/06/2020          PATHOLOGY:  As noted above.    RADIOLOGY:  As noted above.    ASSESSMENT &PLAN  The patient is a 60 y.o. female who presents for evaluation of myeloma.    # IgG kappa MM: Prior lines of therapy include (1) VRd --> MEL ASCT --> observation, (2) Rev --> Rd, and now (3) DARA-Pom/dex started 04/2020 for M-spike rising to 1.1. Now down to 0.8 as of 07/07/2020, although some of this may be DARA interference of course. Had brief increase in [facility] around time of breast surgery 09/2020 (when surgery was on hold), but then resumed thereafter. 10/2020 M-spike 0.7 with kappa 41.2, lambda 3.7, K/L 11.14.   - Patient has history of allergic reactions and HAE (was monitored 1-3 hrs after first doses, got Singulair pre-med initially) but no issues so far with DARA.   - Cont DARA-Pd  DARA every other week  Resume Pom 49m daily, 21 of 28 days  Dex once weekly, 21 of 28 days   - Continue monthly MM labs    # Anxiety, emotional distress: Causes of stress include her role as sole caregiver to her son (intellectually disabled, severe hyperparathyroidism), husband (diabetes), and herself. Also stressed about the myeloma itself and its prognosis, and also about COVID.   - Will refer to SMS as a start   - Can consider onchopsychology referral down the line    # Constipation: Will start with senna & colace.    # Gr3 neutropenia: On 11/06/20 labs ([id number]) isolated. Possibly pom-related. No fevers or infectious symptoms. Per report, 11/13/20 labs were normal.   - Continue to monitor    # R breast lesion: Noted on 12/2019 PET-CT, then confirmed on 04/2020 R breast [place] She got the breast biopsy 07/08/2020 showed papillary lesion, recommending excisional biopsy which  returned as benign, per patient   - follow up Dr. JIona Coach(local oncologist) and Dr. KMaudie Mercury(surgeon)    # Hereditary angioedema:   - Follows with allergist locally   - No issues with DARA-Pd    # History of PEs: Occurred while on Revlimid.   - Continue low-dose Eliquis indefinitely, particularly while on Pom    #Ppx:  - Continue acyclovir   - Continue Eliquis  - recommend flu shot  - [place] #[place] done, covid ab neg  - COVID #3 08/14/2020... repeat #4 in March 2022. Will repeat COVID Ab in       #Follow-up: 4 weeks    The case was discussed with attending physician Dr. SLurline Del MD.  The above plan was reviewed with the patient, with time to address all questions and issues.    RMagdalene Patricia MD  Advanced Fellow, BMT/CAR-T Therapy  Division of Hematology / Oncology      Cc:    Dr. JIona Coach(Heme)  Dr. CHerbert Moors(Surgeon)      Problems: patient's active cancer represents a life-threatening illness  Risk of complications, morbidity/mortality of patient management: high; the patient's systemic cancer therapy requires regular and intensive monitoring for potential major/life-threatening toxicities  Data review/interpretation: I reviewed and/or  ordered 3+ tests and/or documents    I performed this evaluation using real-time telehealth tools, including a live video Zoom connection between my location and the patient's location. Prior to initiating, the patient consented to perform this evaluation using telehealth tools.

## 2020-12-05 NOTE — Patient Instructions (Addendum)
Your myeloma numbers look good with the current therapy, including the DARA (Darzalex) and POM (Pomalyst) which we will continue. Dr. Iona Coach will keep checking these every month.    Thank you for everything you are doing as a caregiver to your son on top of dealing with the myeloma. We will refer you to our Supportive Care team (SMS, or palliative care) - they are board-certified in helping with symptoms like sleep, anxiety, and emotional distress. Melatonin is fine to try in the meantime. For your constipation, you can try SENNA (Senokot) and COLACE (docusate).    As we discussed, in March 2022 you'll be eligible for a 4th dose of the COVID vaccine. Let us know once you've gotten this (any local pharmacy is fine), and then we'll get your antibody levels checked.

## 2020-12-10 LAB — COMPLETE BLOOD COUNT WITH DIFF
% Eosinophils: 1 %
% Monocytes: 20 %
Abs Basophils: 0 10*3/uL (ref 0.0–0.2)
Abs Eosinophils: 0 10*3/uL (ref 0.0–0.4)
Abs Lymphocytes: 1 10*3/uL (ref 0.7–3.1)
Abs Monocytes: 0.5 10*3/uL (ref 0.1–0.9)
Hematocrit: 36.2 % (ref 34.0–46.6)
Hemoglobin: 11.8 g/dL (ref 11.1–15.9)
Lymphs: 38 %
MCH: 29.9 pg (ref 26.6–33.0)
MCHC: 32.6 g/dL (ref 31.5–35.7)
MCV: 92 fL (ref 79–97)
Neutrophils Absolute: 1 10*3/uL — ABNORMAL LOW (ref 1.4–7.0)
Platelet Count: 213 10*3/uL (ref 150–450)
RDW: 16.5 % — ABNORMAL HIGH (ref 11.7–15.4)
WBC Count: 2.5 10*3/uL — CL (ref 3.4–10.8)

## 2020-12-10 LAB — COMPREHENSIVE METABOLIC PANEL
AST: 10 IU/L (ref 0–40)
Alanine transaminase: 14 IU/L (ref 0–32)
Albumin, Serum / Plasma: 3.8 g/dL (ref 3.8–4.9)
Albumin/Globulin Ratio: 2.2 (ref 1.2–2.2)
Alkaline Phosphatase: 90 IU/L (ref 44–121)
BUN/Creatinine Ratio (External: 20 (ref 12–28)
Bilirubin, Total: 0.9 mg/dL (ref 0.0–1.2)
Calcium, total, Serum / Plasma: 8.8 mg/dL (ref 8.7–10.3)
Carbon Dioxide, Total: 27 mmol/L (ref 20–29)
Chloride, Serum / Plasma: 110 mmol/L — ABNORMAL HIGH (ref 96–106)
Creatinine: 0.74 mg/dL (ref 0.57–1.00)
Globulin, Total: 1.7 g/dL (ref 1.5–4.5)
Glucose, non-fasting: 77 mg/dL (ref 65–99)
Potassium, Serum / Plasma: 3.7 mmol/L (ref 3.5–5.2)
Protein, Total, Serum / Plasma: 5.5 g/dL — ABNORMAL LOW (ref 6.0–8.5)
Sodium, Serum / Plasma: 144 mmol/L (ref 134–144)
Urea Nitrogen, Serum / Plasma: 15 mg/dL (ref 8–27)
eGFR - high estimate: 102 mL/min/{1.73_m2} (ref >59)
eGFR - low estimate: 88 mL/min/{1.73_m2} (ref >59)

## 2020-12-10 LAB — COMPLETE BLOOD COUNT WITH DIFFERENTIAL
% Basophils: 1 %
% Neutrophils: 40 %
RBC Count: 3.94 x10E6/uL (ref 3.77–5.28)

## 2020-12-10 LAB — IGM, SERUM: IgM: 9 mg/dL — ABNORMAL LOW (ref 26–217)

## 2020-12-10 LAB — IGG, SERUM: IgG: 793 mg/dL (ref 586–1602)

## 2020-12-10 LAB — PROTEIN ELECTROPHORESIS, SERUM
A/G Ratio: 1.1 (ref 0.7–1.7)
Albumin, SPEP: 3 g/dL (ref 2.9–4.4)
Alpha 1 Globulin: 0.3 g/dL (ref 0.0–0.4)
Alpha 2 Globulin: 0.7 g/dL (ref 0.4–1.0)
Beta Globulin: 0.9 g/dL (ref 0.7–1.3)
Gamma Globulin: 0.7 g/dL (ref 0.4–1.8)
Globulin, Total: 2.7 g/dL (ref 2.2–3.9)
M-Spike, serum: 0.6 g/dL — ABNORMAL HIGH
Protein, Total, Serum / Plasma: 5.7 g/dL — ABNORMAL LOW (ref 6.0–8.5)

## 2020-12-10 LAB — LACTATE DEHYDROGENASE, BLOOD: Lactate Dehydrogenase: 107 IU/L — ABNORMAL LOW (ref 119–226)

## 2020-12-10 LAB — KAPPA AND LAMBDA FREE LIGHT CHAINS, SERUM: Kappa/Lambda Ratio, Serum, Free: 10.13 — ABNORMAL HIGH (ref 0.26–1.65)

## 2020-12-10 LAB — IGA, SERUM: IgA, Serum: 13 mg/dL — ABNORMAL LOW (ref 87–352)

## 2020-12-10 LAB — KAPPA AND LAMBDA FREE LIGHT CH
Kappa Lt Chain, Free: 32.4 mg/L — ABNORMAL HIGH (ref 3.3–19.4)
Lambda Lt Chain, Free: 3.2 mg/L — ABNORMAL LOW (ref 5.7–26.3)

## 2020-12-30 ENCOUNTER — Ambulatory Visit: Admit: 2020-12-31 | Payer: PRIVATE HEALTH INSURANCE | Attending: Physician

## 2020-12-30 DIAGNOSIS — C9000 Multiple myeloma not having achieved remission: Secondary | ICD-10-CM

## 2020-12-30 NOTE — Progress Notes (Signed)
Patient Name:  Ariel Braun    DOB:  Jun 09, 1961    MEDICAL RECORD NUMBER [id number]    Date of Visit: 12/30/2020     Today I saw Ariel Braun in the [facility], in consultation regarding myeloma. She has been referred internally by Dr. Eliberto Ivory. I have reviewed her medical records from [place] and will summarize them here for our records.    CHIEF COMPLAINT: Myeloma    SUMMARY OF HEMATOLOGIC HISTORY:  Ariel Braun is a 60 y.o. female who presents with myeloma. 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 M-spike 32, kappa 596.9 mg/L, IgG 4750, b2M 3.88. She was treated with VRd x4 (achieving VGPR), then MEL 200 ASCT 05/12/11, then observation alone. 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 She met criteria for PD on 02/19/2020 and was started on third-line DARA-Pd in June 2021.       INTERVAL HISTORY:   - Accompanied by daughter [name] via Zoom     - Started DARA-Pd 04/2020 (pom 70m 21/28 days starting 05/14/20, dara starting 05/16/2020), which she is tolerating well. No allergic reactions or symptoms of an HAE flare.     - Issues currently include problems sleeping and constipation. She attributes her insomnia to anxiety about her disease, her son (who is intellectually disabled and has hyperparathyroidism with several kidney stones), and fears of COVID.     - She is the sole caregiver to her husband (diabetes) and son (as above), and unfortunately her family and church have not been as supportive as she had hoped. This has been very emotionally taxing for her. They had a rough week. Last weekend, he had hematuria. She has been taking care of him and he passed two stones after drinking a lot of water.       ALLERGIES:  Allergies/Contraindications   Allergen Reactions    Caffeine Other (See Comments)     jitters       MEDICATIONS:  Current Outpatient Medications   Medication Sig Dispense Refill    acyclovir (ZOVIRAX) 400 mg tablet  Take 1 tablet (400 mg total) by mouth Twice a day 180 tablet 3    amoxicillin (AMOXIL) 500 mg capsule Take 4 capsules (2,000 mg total) by mouth daily as needed (1 hour prior to dental procedures) 4 capsule 3    apixaban (ELIQUIS) 5 mg tablet Take 5 mg by mouth 2 (two) times daily      cholestyramine light (CHOLESTYRAMINE LIGHT) 4 gram packet Take 1 packet (4 g total) by mouth Twice a day 180 packet 3    dexAMETHasone (DECADRON) 4 mg tablet TAKE 2 TABLETS (8 MG TOTAL) BY MOUTH EVERY 7 DAYS. (Patient taking differently: Take 12 mg by mouth every 7 (seven) days   ) 24 tablet 3    ecallantide (KALBITOR) 10 mg/mL (1 mL) SOLN Inject into the skin once as needed.      EPINEPHrine (EPIPEN) 0.3 mg/0.3 mL (1:1,000) injection Inject 0.3 mg into the muscle once as needed. Use as instructed (Patient not taking: Reported on 10/14/2020  )      icatibant (FIRAZYR) 30 mg/3 mL SYRINGE Inject into the skin once as needed.      LORazepam (ATIVAN) 1 mg tablet Take 1 mg by mouth every 6 (six) hours as needed      metoclopramide HCl (REGLAN) 10 mg tablet Take 10 mg by mouth once  as needed. (Patient not taking: Reported on 10/14/2020  )      multivitamin tablet Take 1 tablet by mouth Daily.       ondansetron (ZOFRAN) 8 mg tablet Take 1 tablet (8 mg total) by mouth every 8 (eight) hours as needed. (Patient not taking: Reported on 10/14/2020  ) 30 tablet 2    pomalidomide 2 mg CAP Take 2 mg by mouth daily 21/28 days        predniSONE (DELTASONE) 20 mg tablet Take 20 mg by mouth once as needed. (Patient not taking: Reported on 10/14/2020  )       No current facility-administered medications for this visit.       PAST MEDICAL HISTORY:  Past Medical History:   Diagnosis Date    Acid reflux disease     Anxiety     Depression     Hereditary angioedema     Multiple myeloma, without mention of having achieved remission     IgG kappa    Myeloma 05/10/2011       PAST SURGICAL HISTORY:  Past Surgical History:   Procedure Laterality  Date    AUTOLOGOUS STEM CELL TRANSPLANTATION  05/12/11    BREAST CYST EXCISION  1981   R breast surgery on 10/02/20 (path negative)    FAMILY HISTORY:  Family History   Problem Relation Name Age of Onset    Diabetes Mother      Hypertension Mother      Colon cancer Father      Hypertension Brother ##Brother1        SOCIAL HISTORY:  Social History     Socioeconomic History    Marital status: Married   Tobacco Use    Smoking status: Never Smoker    Smokeless tobacco: Never Used   Substance and Sexual Activity    Alcohol use: No    Drug use: No    Sexual activity: Not Currently       PHYSICAL EXAM:  PHYSICAL EXAM VIA VIDEO VISIT:  ECOG Performance Status: 1 - Symptomatic but completely ambulatory   Vital Signs: No vitals  General:  Awake, alert, pleasantly conversant female in no acute distress  HENT:  Normocephalic, Atraumatic, Bilateral external ears normal, No oral exudates, Nose normal. No stridor. No thrush  Eyes:  EOMI, Conjunctiva normal, No discharge  Respiratory:  Normal breathing, No respiratory distress  Integument:  No rash on visible skin  Neurologic:  Alert & oriented x 3  Psychiatric:  Affect normal, Judgment normal      LABORATORY DATA:  Prot Electrophoresis   Date Value Ref Range Status   08/03/2019 Abnormal (A) Normal pattern. 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 Paraprotein spike = 1.2 g/dL.  Interpretation by Pathologist: Clelia Schaumann, M.D.       Paraprotein Concentration   Date Value Ref Range Status   08/03/2019 1.2 (A) Negative g/dL Final     SPEP - External   Date Value Ref Range Status   08/04/2020 0.8  Final     Immunofixation Electrophoresis, serum   Date Value Ref Range Status   12/12/2018 IgG Kappa (A) Negative Final     Comment:     Paraprotein present, IgG Kappa  Interpretation by Pathologist: Clelia Schaumann, M.D.       Lab Results   Component Value Date    IgA 13 (L) 12/09/2020    IgA -  External 12  08/04/2020    IgG, serum 1,490 08/03/2019    IgG - External 989 08/04/2020    IgM, serum 18 (L) 04/03/2019    IgM - External 14 08/04/2020    Kappa (Serum, Free) - External 70.6 08/04/2020    Kappa Lt Chain, Free 32.4 (H) 12/09/2020    Lambda (Serum, Free) - External 3.9 08/04/2020    Lambda Lt Chain, Free 3.2 (L) 12/09/2020    Kappa/Lambda Ratio 10.13 (H) 12/09/2020          PATHOLOGY:  As noted above.    RADIOLOGY:  As noted above.    ASSESSMENT &PLAN  The patient is a 60 y.o. female who presents for evaluation of myeloma.    # IgG kappa MM: Prior lines of therapy include (1) VRd --> MEL ASCT --> observation, (2) Rev --> Rd, and now (3) DARA-Pom/dex started 04/2020 for M-spike rising to 1.1. Now down to 0.8 as of 07/07/2020, although some of this may be DARA interference of course. Had brief increase in [facility] around time of breast surgery 09/2020 (when surgery was on hold), but then resumed thereafter. 12/09/20 SPEP M protein and SFLC downtrending.    - Patient has history of allergic reactions and HAE (was monitored 1-3 hrs after first doses, got Singulair pre-med initially) but no issues so far with DARA.   - Cont DARA-Pd  DARA every other week  Continue Pom 89m daily, 21 of 28 days  Dex once weekly, 21 of 28 days   - Continue monthly MM labs    # Anxiety, emotional distress: Causes of stress include her role as sole caregiver to her son (intellectually disabled, severe hyperparathyroidism), husband (diabetes), and herself. Also stressed about the myeloma itself and its prognosis, and also about COVID.   - Will refer to SMS as a start   - Can consider onchopsychology referral down the line    # Constipation: Will start with senna & colace.    # Gr3 neutropenia: On 11/06/20 labs ([place]600) isolated. Possibly pom-related. No fevers or infectious symptoms. Per report, 11/13/20 labs were normal.   - Continue to monitor    # R breast lesion: Noted on 12/2019 PET-CT, then confirmed on 04/2020 R breast [place] She got the breast  biopsy 07/08/2020 showed papillary lesion, recommending excisional biopsy which returned as benign, per patient   - follow up Dr. JIona Coach(local oncologist) and Dr. KMaudie Mercury(surgeon)    # Hereditary angioedema:   - Follows with allergist locally   - No issues with DARA-Pd    # History of PEs: Occurred while on Revlimid.   - Continue low-dose Eliquis indefinitely, particularly while on Pom    #Ppx:  - Continue acyclovir   - Continue Eliquis  - recommend flu shot  - PStevinson#1, #2 done, covid ab neg  - COVID #3 08/14/2020... repeat #4 in March 2022 - repeat covid ab 4 weeks after this shot    #Follow-up: 4 weeks    Thank you for involving me in the care of your patient. Please do not hesitate to call me anytime to discuss this patient.    SKennon Rounds MD  Assistant Clinical Professor  Hematology/Blood and Marrow Transplantation  [facility]      Cc:    Dr. JIona Coach(Heme)  Dr. CHerbert Moors(Surgeon)      Problems: patient's active cancer represents a life-threatening illness  Risk of complications, morbidity/mortality of patient management: high; the patient's systemic  cancer therapy requires regular and intensive monitoring for potential major/life-threatening toxicities  Data review/interpretation: I reviewed and/or ordered 3+ tests and/or documents    I performed this evaluation using real-time telehealth tools, including a live video Zoom connection between my location and the patient's location. Prior to initiating, the patient consented to perform this evaluation using telehealth tools.

## 2021-01-07 LAB — COMPREHENSIVE METABOLIC PANEL
AST: 12 IU/L (ref 0–40)
Alanine transaminase: 16 IU/L (ref 0–32)
Albumin, Serum / Plasma: 4.1 g/dL (ref 3.8–4.9)
Albumin/Globulin Ratio: 2.1 (ref 1.2–2.2)
Alkaline Phosphatase: 91 IU/L (ref 44–121)
BUN/Creatinine Ratio (External: 22 (ref 12–28)
Bilirubin, Total: 1.1 mg/dL (ref 0.0–1.2)
Calcium, total, Serum / Plasma: 9.3 mg/dL (ref 8.7–10.3)
Carbon Dioxide, Total: 24 mmol/L (ref 20–29)
Chloride, Serum / Plasma: 109 mmol/L — ABNORMAL HIGH (ref 96–106)
Creatinine: 0.77 mg/dL (ref 0.57–1.00)
Globulin, Total: 2 g/dL (ref 1.5–4.5)
Glucose, non-fasting: 81 mg/dL (ref 65–99)
Potassium, Serum / Plasma: 3.6 mmol/L (ref 3.5–5.2)
Protein, Total, Serum / Plasma: 6.1 g/dL (ref 6.0–8.5)
Sodium, Serum / Plasma: 145 mmol/L — ABNORMAL HIGH (ref 134–144)
Urea Nitrogen, Serum / Plasma: 17 mg/dL (ref 8–27)
eGFR - high estimate: 97 mL/min/{1.73_m2} (ref >59)
eGFR - low estimate: 84 mL/min/{1.73_m2} (ref >59)

## 2021-01-07 LAB — PROTEIN ELECTROPHORESIS, SERUM
A/G Ratio: 1.1 (ref 0.7–1.7)
Albumin, SPEP: 3.2 g/dL (ref 2.9–4.4)
Alpha 1 Globulin: 0.3 g/dL (ref 0.0–0.4)
Alpha 2 Globulin: 0.8 g/dL (ref 0.4–1.0)
Beta Globulin: 1 g/dL (ref 0.7–1.3)
Gamma Globulin: 0.7 g/dL (ref 0.4–1.8)
Globulin, Total: 2.9 g/dL (ref 2.2–3.9)
M-Spike, serum: 0.6 g/dL — ABNORMAL HIGH
Protein, Total, Serum / Plasma: 6.1 g/dL (ref 6.0–8.5)

## 2021-01-07 LAB — COMPLETE BLOOD COUNT WITH DIFF
% Basophils: 1 %
% Eosinophils: 1 %
% Monocytes: 18 %
% Neutrophils: 34 %
Abs Basophils: 0 10*3/uL (ref 0.0–0.2)
Abs Eosinophils: 0 10*3/uL (ref 0.0–0.4)
Abs Lymphocytes: 1.4 10*3/uL (ref 0.7–3.1)
Abs Monocytes: 0.5 10*3/uL (ref 0.1–0.9)
Hematocrit: 37.6 % (ref 34.0–46.6)
Hemoglobin: 12.1 g/dL (ref 11.1–15.9)
Lymphs: 46 %
MCH: 30 pg (ref 26.6–33.0)
MCHC: 32.2 g/dL (ref 31.5–35.7)
MCV: 93 fL (ref 79–97)
Neutrophils Absolute: 1 10*3/uL — ABNORMAL LOW (ref 1.4–7.0)
Platelet Count: 209 10*3/uL (ref 150–450)
RBC Count: 4.03 x10E6/uL (ref 3.77–5.28)
RDW: 17.2 % — ABNORMAL HIGH (ref 11.7–15.4)
WBC Count: 3 10*3/uL — ABNORMAL LOW (ref 3.4–10.8)

## 2021-01-07 LAB — IGM, SERUM: IgM: 9 mg/dL — ABNORMAL LOW (ref 26–217)

## 2021-01-07 LAB — LACTATE DEHYDROGENASE, BLOOD: Lactate Dehydrogenase: 124 IU/L (ref 119–226)

## 2021-01-07 LAB — KAPPA AND LAMBDA FREE LIGHT CH
Kappa Lt Chain, Free: 35.5 mg/L — ABNORMAL HIGH (ref 3.3–19.4)
Kappa/Lambda Ratio: 9.86 — ABNORMAL HIGH (ref 0.26–1.65)
Lambda Lt Chain, Free: 3.6 mg/L — ABNORMAL LOW (ref 5.7–26.3)

## 2021-01-07 LAB — IGG, SERUM: IgG: 814 mg/dL (ref 586–1602)

## 2021-01-07 LAB — IGA, SERUM: IgA: 14 mg/dL — ABNORMAL LOW (ref 87–352)

## 2021-02-05 LAB — COMPREHENSIVE METABOLIC PANEL
AST: 17 IU/L (ref 0–40)
Alanine transaminase: 18 IU/L (ref 0–32)
Albumin, Serum / Plasma: 4 g/dL (ref 3.8–4.8)
Albumin/Globulin Ratio: 2.4 — ABNORMAL HIGH (ref 1.2–2.2)
Alkaline Phosphatase: 92 IU/L (ref 44–121)
BUN/Creatinine Ratio (External: 12 (ref 12–28)
Bilirubin, Total: 0.9 mg/dL (ref 0.0–1.2)
Calcium, total, Serum / Plasma: 8.9 mg/dL (ref 8.7–10.3)
Carbon Dioxide, Total: 23 mmol/L (ref 20–29)
Chloride, Serum / Plasma: 109 mmol/L — ABNORMAL HIGH (ref 96–106)
Creatinine: 0.75 mg/dL (ref 0.57–1.00)
Globulin, Total: 1.7 g/dL (ref 1.5–4.5)
Glucose, non-fasting: 94 mg/dL (ref 65–99)
Potassium, Serum / Plasma: 3.7 mmol/L (ref 3.5–5.2)
Protein, Total, Serum / Plasma: 5.7 g/dL — ABNORMAL LOW (ref 6.0–8.5)
Sodium, Serum / Plasma: 143 mmol/L (ref 134–144)
Urea Nitrogen, Serum / Plasma: 9 mg/dL (ref 8–27)
eGFRcr: 91 mL/min/{1.73_m2} (ref >59)

## 2021-02-05 LAB — COMPLETE BLOOD COUNT WITH DIFF
% Basophils: 1 %
% Eosinophils: 5 %
% Lymphocytes: 40 %
% Monocytes: 16 %
% Neutrophils: 38 %
Abs Basophils: 0 10*3/uL (ref 0.0–0.2)
Abs Eosinophils: 0.1 10*3/uL (ref 0.0–0.4)
Abs Lymphocytes: 1.1 10*3/uL (ref 0.7–3.1)
Abs Monocytes: 0.4 10*3/uL (ref 0.1–0.9)
Abs Neutrophils: 1 10*3/uL — ABNORMAL LOW (ref 1.4–7.0)
Hematocrit: 37.3 % (ref 34.0–46.6)
Hemoglobin: 11.9 g/dL (ref 11.1–15.9)
MCH: 29.6 pg (ref 26.6–33.0)
MCHC: 31.9 g/dL (ref 31.5–35.7)
MCV: 93 fL (ref 79–97)
Platelet Count: 221 10*3/uL (ref 150–450)
RBC Count: 4.02 x10E6/uL (ref 3.77–5.28)
RDW: 16.5 % — ABNORMAL HIGH (ref 11.7–15.4)
WBC Count: 2.6 10*3/uL — ABNORMAL LOW (ref 3.4–10.8)

## 2021-02-05 LAB — LACTATE DEHYDROGENASE, BLOOD: Lactate Dehydrogenase: 151 IU/L (ref 119–226)

## 2021-02-06 ENCOUNTER — Ambulatory Visit: Admit: 2021-02-06 | Payer: PRIVATE HEALTH INSURANCE | Attending: Physician

## 2021-02-06 DIAGNOSIS — C9000 Multiple myeloma not having achieved remission: Secondary | ICD-10-CM

## 2021-02-06 LAB — IGA, SERUM: IgA: 13 mg/dL — ABNORMAL LOW (ref 87–352)

## 2021-02-06 LAB — PROTEIN ELECTROPHORESIS, SERUM
A/G Ratio: 1.2 (ref 0.7–1.7)
Albumin, SPEP: 3.2 g/dL (ref 2.9–4.4)
Alpha 1 Globulin: 0.3 g/dL (ref 0.0–0.4)
Alpha 2 Globulin: 0.8 g/dL (ref 0.4–1.0)
Beta Globulin: 1 g/dL (ref 0.7–1.3)
Gamma Globulin: 0.6 g/dL (ref 0.4–1.8)
Globulin, Total: 2.7 g/dL (ref 2.2–3.9)
M-Spike, serum: 0.5 g/dL — ABNORMAL HIGH
Protein, Total, Serum / Plasma: 5.9 g/dL — ABNORMAL LOW (ref 6.0–8.5)

## 2021-02-06 LAB — KAPPA AND LAMBDA FREE LIGHT CH
Kappa Lt Chain, Free: 36.9 mg/L — ABNORMAL HIGH (ref 3.3–19.4)
Kappa/Lambda Ratio: 9.97 — ABNORMAL HIGH (ref 0.26–1.65)
Lambda Lt Chain, Free: 3.7 mg/L — ABNORMAL LOW (ref 5.7–26.3)

## 2021-02-06 LAB — IGG, SERUM: IgG: 781 mg/dL (ref 586–1602)

## 2021-02-06 LAB — IGM, SERUM: IgM: 12 mg/dL — ABNORMAL LOW (ref 26–217)

## 2021-02-06 NOTE — Progress Notes (Signed)
Patient Name:  Ariel Braun    DOB:  Oct 28, 1961    MEDICAL RECORD NUMBER [id number]    Date of Visit: 02/06/2021     Today I saw Ariel Braun in the [facility], in consultation regarding myeloma. She has been referred internally by Dr. Eliberto Ivory. I have reviewed her medical records from [place] and will summarize them here for our records.    CHIEF COMPLAINT: Myeloma    SUMMARY OF HEMATOLOGIC HISTORY:  Ariel Braun is a 60 y.o. female who presents with myeloma. 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 M-spike 32, kappa 596.9 mg/L, IgG 4750, b2M 3.88. She was treated with VRd x4 (achieving VGPR), then MEL 200 ASCT 05/12/11, then observation alone. 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 She met criteria for PD on 02/19/2020 and was started on third-line DARA-Pd in June 2021.       INTERVAL HISTORY:   - Accompanied by daughter [name] via Zoom     - Started DARA-Pd 04/2020 (pom 66m 21/28 days starting 05/14/20, dara starting 05/16/2020), which she is tolerating well. No allergic reactions or symptoms of an HAE flare.     - Issues currently include problems sleeping and constipation. She attributes her insomnia to anxiety about her disease, her son [name](who is intellectually disabled and has hyperparathyroidism with several kidney stones but he is getting better), and fears of COVID.     - She is the sole caregiver to her husband (diabetes) and son (as above), and unfortunately her family and church have not been as supportive as she had hoped. This has been very emotionally taxing for her. They had a rough week. Last weekend, he had hematuria. She has been taking care of him and he passed two stones after drinking a lot of water.     - got the vaccine COVID on Monday and then felt crummy Tues and Wed and now doing. Insomnia is better. Constipation is ok and eating better.      ALLERGIES:  Allergies/Contraindications   Allergen  Reactions    Caffeine Other (See Comments)     jitters       MEDICATIONS:  Current Outpatient Medications   Medication Sig Dispense Refill    acyclovir (ZOVIRAX) 400 mg tablet Take 1 tablet (400 mg total) by mouth Twice a day 180 tablet 3    amoxicillin (AMOXIL) 500 mg capsule Take 4 capsules (2,000 mg total) by mouth daily as needed (1 hour prior to dental procedures) 4 capsule 3    apixaban (ELIQUIS) 5 mg tablet Take 5 mg by mouth 2 (two) times daily      cholestyramine light (CHOLESTYRAMINE LIGHT) 4 gram packet Take 1 packet (4 g total) by mouth Twice a day 180 packet 3    dexAMETHasone (DECADRON) 4 mg tablet TAKE 2 TABLETS (8 MG TOTAL) BY MOUTH EVERY 7 DAYS. (Patient taking differently: Take 12 mg by mouth every 7 (seven) days   ) 24 tablet 3    ecallantide (KALBITOR) 10 mg/mL (1 mL) SOLN Inject into the skin once as needed.      EPINEPHrine (EPIPEN) 0.3 mg/0.3 mL (1:1,000) injection Inject 0.3 mg into the muscle once as needed. Use as instructed (Patient not taking: Reported on 10/14/2020  )      icatibant (FIRAZYR) 30 mg/3 mL SYRINGE Inject into the skin once as needed.  LORazepam (ATIVAN) 1 mg tablet Take 1 mg by mouth every 6 (six) hours as needed      metoclopramide HCl (REGLAN) 10 mg tablet Take 10 mg by mouth once as needed. (Patient not taking: Reported on 10/14/2020  )      multivitamin tablet Take 1 tablet by mouth Daily.       ondansetron (ZOFRAN) 8 mg tablet Take 1 tablet (8 mg total) by mouth every 8 (eight) hours as needed. (Patient not taking: Reported on 10/14/2020  ) 30 tablet 2    pomalidomide 2 mg CAP Take 2 mg by mouth daily 21/28 days        predniSONE (DELTASONE) 20 mg tablet Take 20 mg by mouth once as needed. (Patient not taking: Reported on 10/14/2020  )       No current facility-administered medications for this visit.       PAST MEDICAL HISTORY:  Past Medical History:   Diagnosis Date    Acid reflux disease     Anxiety     Depression     Hereditary angioedema      Multiple myeloma, without mention of having achieved remission     IgG kappa    Myeloma 05/10/2011       PAST SURGICAL HISTORY:  Past Surgical History:   Procedure Laterality Date    AUTOLOGOUS STEM CELL TRANSPLANTATION  05/12/11    BREAST CYST EXCISION  1981   R breast surgery on 10/02/20 (path negative)    FAMILY HISTORY:  Family History   Problem Relation Name Age of Onset    Diabetes Mother      Hypertension Mother      Colon cancer Father      Hypertension Brother ##Brother1        SOCIAL HISTORY:  Social History     Socioeconomic History    Marital status: Married   Tobacco Use    Smoking status: Never Smoker    Smokeless tobacco: Never Used   Substance and Sexual Activity    Alcohol use: No    Drug use: No    Sexual activity: Not Currently       PHYSICAL EXAM:  PHYSICAL EXAM VIA VIDEO VISIT:  ECOG Performance Status: 1 - Symptomatic but completely ambulatory   Vital Signs: No vitals  General:  Awake, alert, pleasantly conversant female in no acute distress  HENT:  Normocephalic, Atraumatic, Bilateral external ears normal, No oral exudates, Nose normal. No stridor. No thrush  Eyes:  EOMI, Conjunctiva normal, No discharge  Respiratory:  Normal breathing, No respiratory distress  Integument:  No rash on visible skin  Neurologic:  Alert & oriented x 3  Psychiatric:  Affect normal, Judgment normal      LABORATORY DATA:  Prot Electrophoresis   Date Value Ref Range Status   08/03/2019 Abnormal (A) Normal pattern. 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 Paraprotein spike = 1.2 g/dL.  Interpretation by Pathologist: Clelia Schaumann, M.D.       Paraprotein Concentration   Date Value Ref Range Status   08/03/2019 1.2 (A) Negative g/dL Final     SPEP - External   Date Value Ref Range Status   08/04/2020 0.8  Final     Immunofixation Electrophoresis, serum   Date Value Ref Range Status   12/12/2018 IgG Kappa (A) Negative Final     Comment:      Paraprotein present,  IgG Kappa  Interpretation by Pathologist: Clelia Schaumann, M.D.       Lab Results   Component Value Date    IgA 13 (L) 02/04/2021    IgA - External 12 08/04/2020    IgG, serum 1,490 08/03/2019    IgG - External 989 08/04/2020    IgM, serum 18 (L) 04/03/2019    IgM - External 14 08/04/2020    Kappa (Serum, Free) - External 70.6 08/04/2020    Kappa Lt Chain, Free 36.9 (H) 02/04/2021    Lambda (Serum, Free) - External 3.9 08/04/2020    Lambda Lt Chain, Free 3.7 (L) 02/04/2021    Kappa/Lambda Ratio 9.97 (H) 02/04/2021          PATHOLOGY:  As noted above.    RADIOLOGY:  As noted above.    ASSESSMENT &PLAN  The patient is a 60 y.o. female who presents for evaluation of myeloma.    # IgG kappa MM: Prior lines of therapy include (1) VRd --> MEL ASCT --> observation, (2) Rev --> Rd, and now (3) DARA-Pom/dex started 04/2020 for M-spike rising to 1.1. Now down to 0.8 as of 07/07/2020, although some of this may be DARA interference of course. Had brief increase in [facility] around time of breast surgery 09/2020 (when surgery was on hold), but then resumed thereafter. 01/2021 SPEP M protein and SFLC downtrending.    - Patient has history of allergic reactions and HAE (was monitored 1-3 hrs after first doses, got Singulair pre-med initially) but no issues so far with DARA.   - Cont DARA-Pd  DARA once a month   Continue Pom 48m daily, 21 of 28 days  Dex once weekly, 21 of 28 days   - Continue monthly MM labs    # Anxiety, emotional distress: Causes of stress include her role as sole caregiver to her son (intellectually disabled, severe hyperparathyroidism), husband (diabetes), and herself. Also stressed about the myeloma itself and its prognosis, and also about COVID.   - Will refer to SMS as a start   - Can consider onchopsychology referral down the line    # Constipation: Will start with senna & colace.    # Gr3 neutropenia: On 11/06/20 labs ([place]600) isolated. Possibly pom-related. No fevers or infectious  symptoms. Per report, 11/13/20 labs were normal.   - Continue to monitor    # R breast lesion: Noted on 12/2019 PET-CT, then confirmed on 04/2020 R breast [place] She got the breast biopsy 07/08/2020 showed papillary lesion, recommending excisional biopsy which returned as benign, per patient   - follow up Dr. JIona Coach(local oncologist) and Dr. KMaudie Mercury(surgeon)    # Hereditary angioedema:   - Follows with allergist locally   - No issues with DARA-Pd    # History of PEs: Occurred while on Revlimid.   - Continue low-dose Eliquis indefinitely, particularly while on Pom    #Bone health  - if she did not finish 2 years of zometa, then would recommend finishing a total of 2 years AFTER she finishes the dental work she needs currently    #Ppx:  - Continue acyclovir   - Continue Eliquis  - recommend flu shot  - PBowman#1, #2 done, covid ab neg  - COVID #3 08/14/2020, just got her COVID #4 vaccine ... - repeat covid ab 4 weeks after this shot    #Follow-up: 4 weeks    Thank you for involving me in the care of your patient. Please do  not hesitate to call me anytime to discuss this patient.    Kennon Rounds, MD  Assistant Clinical Professor  Hematology/Blood and Marrow Transplantation  [facility]      Cc:    Dr. Iona Coach (Heme)  Dr. Herbert Moors (Surgeon)      Problems: patient's active cancer represents a life-threatening illness  Risk of complications, morbidity/mortality of patient management: high; the patient's systemic cancer therapy requires regular and intensive monitoring for potential major/life-threatening toxicities  Data review/interpretation: I reviewed and/or ordered 3+ tests and/or documents    I performed this evaluation using real-time telehealth tools, including a live video Zoom connection between my location and the patient's location. Prior to initiating, the patient consented to perform this evaluation using telehealth tools.

## 2021-03-04 LAB — COMPREHENSIVE METABOLIC PANEL
AST: 16 IU/L (ref 0–40)
Alanine transaminase: 20 IU/L (ref 0–32)
Albumin, Serum / Plasma: 3.8 g/dL (ref 3.8–4.8)
Albumin/Globulin Ratio: 2.4 — ABNORMAL HIGH (ref 1.2–2.2)
Alkaline Phosphatase: 78 IU/L (ref 44–121)
BUN/Creatinine Ratio (External: 23 (ref 12–28)
Bilirubin, Total: 0.9 mg/dL (ref 0.0–1.2)
Calcium, total, Serum / Plasma: 8.7 mg/dL (ref 8.7–10.3)
Carbon Dioxide, Total: 22 mmol/L (ref 20–29)
Chloride, Serum / Plasma: 107 mmol/L — ABNORMAL HIGH (ref 96–106)
Creatinine: 0.73 mg/dL (ref 0.57–1.00)
Globulin, Total: 1.6 g/dL (ref 1.5–4.5)
Glucose, non-fasting: 75 mg/dL (ref 65–99)
Potassium, Serum / Plasma: 3.1 mmol/L — ABNORMAL LOW (ref 3.5–5.2)
Protein, Total, Serum / Plasma: 5.4 g/dL — ABNORMAL LOW (ref 6.0–8.5)
Sodium, Serum / Plasma: 144 mmol/L (ref 134–144)
Urea Nitrogen, Serum / Plasma: 17 mg/dL (ref 8–27)
eGFRcr: 94 mL/min/{1.73_m2} (ref >59)

## 2021-03-04 LAB — COMPLETE BLOOD COUNT WITH DIFF
% Basophils: 1 %
% Eosinophils: 1 %
% Lymphocytes: 38 %
% Monocytes: 17 %
% Neutrophils: 43 %
Abs Basophils: 0 10*3/uL (ref 0.0–0.2)
Abs Eosinophils: 0 10*3/uL (ref 0.0–0.4)
Abs Lymphocytes: 1.2 10*3/uL (ref 0.7–3.1)
Abs Monocytes: 0.5 10*3/uL (ref 0.1–0.9)
Abs Neutrophils: 1.3 10*3/uL — ABNORMAL LOW (ref 1.4–7.0)
Hematocrit: 38 % (ref 34.0–46.6)
Hemoglobin: 12.4 g/dL (ref 11.1–15.9)
MCH: 30 pg (ref 26.6–33.0)
MCHC: 32.6 g/dL (ref 31.5–35.7)
MCV: 92 fL (ref 79–97)
Platelet Count: 224 10*3/uL (ref 150–450)
RBC Count: 4.14 x10E6/uL (ref 3.77–5.28)
RDW: 15.4 % (ref 11.7–15.4)
WBC Count: 3 10*3/uL — ABNORMAL LOW (ref 3.4–10.8)

## 2021-03-04 LAB — PROTEIN ELECTROPHORESIS, SERUM
A/G Ratio: 1.2 (ref 0.7–1.7)
Albumin, SPEP: 3.1 g/dL (ref 2.9–4.4)
Alpha 1 Globulin: 0.3 g/dL (ref 0.0–0.4)
Alpha 2 Globulin: 0.7 g/dL (ref 0.4–1.0)
Beta Globulin: 0.9 g/dL (ref 0.7–1.3)
Gamma Globulin: 0.7 g/dL (ref 0.4–1.8)
Globulin, Total: 2.5 g/dL (ref 2.2–3.9)
M-Spike, serum: 0.6 g/dL — ABNORMAL HIGH
Protein, Total, Serum / Plasma: 5.6 g/dL — ABNORMAL LOW (ref 6.0–8.5)

## 2021-03-04 LAB — IGG, SERUM: IgG: 693 mg/dL (ref 586–1602)

## 2021-03-04 LAB — KAPPA AND LAMBDA FREE LIGHT CH
Kappa Lt Chain, Free: 25.6 mg/L — ABNORMAL HIGH (ref 3.3–19.4)
Kappa/Lambda Ratio: 9.14 — ABNORMAL HIGH (ref 0.26–1.65)
Lambda Lt Chain, Free: 2.8 mg/L — ABNORMAL LOW (ref 5.7–26.3)

## 2021-03-04 LAB — SARS-COV-2 ANTIBODY (IGG), SPI
Interpretation: POSITIVE
Sars-CoV-2 Semi-Quant Total AB: 6.9 U/mL (ref <0.8)

## 2021-03-04 LAB — IGM, SERUM: IgM: 27 mg/dL (ref 26–217)

## 2021-03-04 LAB — LACTATE DEHYDROGENASE, BLOOD: Lactate Dehydrogenase: 130 IU/L (ref 119–226)

## 2021-03-04 LAB — IGA, SERUM: IgA: 14 mg/dL — ABNORMAL LOW (ref 87–352)

## 2021-03-06 ENCOUNTER — Ambulatory Visit: Admit: 2021-03-06 | Payer: PRIVATE HEALTH INSURANCE | Attending: Physician

## 2021-03-06 DIAGNOSIS — C9000 Multiple myeloma not having achieved remission: Secondary | ICD-10-CM

## 2021-03-06 NOTE — Progress Notes (Signed)
Patient Name:  Ariel Braun    DOB:  01/16/1961    MEDICAL RECORD NUMBER [id number]    Date of Visit: 03/06/2021     Today I saw Ariel Braun in the [facility], in consultation regarding myeloma. She has been referred internally by Dr. Eliberto Ivory. I have reviewed her medical records from [place] and will summarize them here for our records.    CHIEF COMPLAINT: Myeloma    SUMMARY OF HEMATOLOGIC HISTORY:  Ariel Braun is a 60 y.o. female who presents with myeloma. 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 M-spike 32, kappa 596.9 mg/L, IgG 4750, b2M 3.88. She was treated with VRd x4 (achieving VGPR), then MEL 200 ASCT 05/12/11, then observation alone. 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 She met criteria for PD on 02/19/2020 and was started on third-line DARA-Pd in June 2021.       INTERVAL HISTORY:   - Accompanied by daughter Ariel Braun via Zoom     - Started DARA-Pd 04/2020 (pom 72m 21/28 days starting 05/14/20, dara starting 05/16/2020), which she is tolerating well. No allergic reactions or symptoms of an HAE flare.     - Issues currently include problems sleeping and constipation. She attributes her insomnia to anxiety about her disease, her son Ariel Braun(who is intellectually disabled and has hyperparathyroidism with several kidney stones but he is getting better), and fears of COVID.     - She is the sole caregiver to her husband (diabetes) and son (as above), and unfortunately her family and church have not been as supportive as she had hoped. He has knee swelling and she is dealing with his med issues.    Insomnia is better. Constipation alternates with diarrhea but it is now diet-controlled.      ALLERGIES:  Allergies/Contraindications   Allergen Reactions    Caffeine Other (See Comments)     jitters       MEDICATIONS:  Current Outpatient Medications   Medication Sig Dispense Refill    acyclovir (ZOVIRAX) 400 mg tablet Take 1  tablet (400 mg total) by mouth Twice a day 180 tablet 3    amoxicillin (AMOXIL) 500 mg capsule Take 4 capsules (2,000 mg total) by mouth daily as needed (1 hour prior to dental procedures) 4 capsule 3    apixaban (ELIQUIS) 5 mg tablet Take 5 mg by mouth 2 (two) times daily      cholestyramine light (CHOLESTYRAMINE LIGHT) 4 gram packet Take 1 packet (4 g total) by mouth Twice a day 180 packet 3    dexAMETHasone (DECADRON) 4 mg tablet TAKE 2 TABLETS (8 MG TOTAL) BY MOUTH EVERY 7 DAYS. (Patient taking differently: Take 12 mg by mouth every 7 (seven) days   ) 24 tablet 3    ecallantide (KALBITOR) 10 mg/mL (1 mL) SOLN Inject into the skin once as needed.      EPINEPHrine (EPIPEN) 0.3 mg/0.3 mL (1:1,000) injection Inject 0.3 mg into the muscle once as needed. Use as instructed (Patient not taking: Reported on 10/14/2020  )      icatibant (FIRAZYR) 30 mg/3 mL SYRINGE Inject into the skin once as needed.      LORazepam (ATIVAN) 1 mg tablet Take 1 mg by mouth every 6 (six) hours as needed      metoclopramide HCl (REGLAN) 10 mg tablet Take 10 mg by mouth once as needed. (Patient not  taking: Reported on 10/14/2020  )      multivitamin tablet Take 1 tablet by mouth Daily.       ondansetron (ZOFRAN) 8 mg tablet Take 1 tablet (8 mg total) by mouth every 8 (eight) hours as needed. (Patient not taking: Reported on 10/14/2020  ) 30 tablet 2    pomalidomide 2 mg CAP Take 2 mg by mouth daily 21/28 days        predniSONE (DELTASONE) 20 mg tablet Take 20 mg by mouth once as needed. (Patient not taking: Reported on 10/14/2020  )       No current facility-administered medications for this visit.       PAST MEDICAL HISTORY:  Past Medical History:   Diagnosis Date    Acid reflux disease     Anxiety     Depression     Hereditary angioedema     Multiple myeloma, without mention of having achieved remission     IgG kappa    Myeloma 05/10/2011       PAST SURGICAL HISTORY:  Past Surgical History:   Procedure Laterality Date     AUTOLOGOUS STEM CELL TRANSPLANTATION  05/12/11    BREAST CYST EXCISION  1981   R breast surgery on 10/02/20 (path negative)    FAMILY HISTORY:  Family History   Problem Relation Name Age of Onset    Diabetes Mother      Hypertension Mother      Colon cancer Father      Hypertension Brother ##Brother1        SOCIAL HISTORY:  Social History     Socioeconomic History    Marital status: Married   Tobacco Use    Smoking status: Never Smoker    Smokeless tobacco: Never Used   Substance and Sexual Activity    Alcohol use: No    Drug use: No    Sexual activity: Not Currently       PHYSICAL EXAM:  PHYSICAL EXAM VIA VIDEO VISIT:  ECOG Performance Status: 1 - Symptomatic but completely ambulatory   Vital Signs: No vitals  General:  Awake, alert, pleasantly conversant female in no acute distress  HENT:  Normocephalic, Atraumatic, Bilateral external ears normal, No oral exudates, Nose normal. No stridor. No thrush  Eyes:  EOMI, Conjunctiva normal, No discharge  Respiratory:  Normal breathing, No respiratory distress  Integument:  No rash on visible skin  Neurologic:  Alert & oriented x 3  Psychiatric:  Affect normal, Judgment normal      LABORATORY DATA:  Prot Electrophoresis   Date Value Ref Range Status   08/03/2019 Abnormal (A) Normal pattern. 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 Paraprotein spike = 1.2 g/dL.  Interpretation by Pathologist: Clelia Schaumann, M.D.       Paraprotein Concentration   Date Value Ref Range Status   08/03/2019 1.2 (A) Negative g/dL Final     SPEP - External   Date Value Ref Range Status   08/04/2020 0.8  Final     Immunofixation Electrophoresis, serum   Date Value Ref Range Status   12/12/2018 IgG Kappa (A) Negative Final     Comment:     Paraprotein present, IgG Kappa  Interpretation by Pathologist: Clelia Schaumann, M.D.       Lab Results   Component Value Date    IgA 14 (L) 03/03/2021    IgA - External 12 08/04/2020  IgG, serum 1,490 08/03/2019    IgG - External 989 08/04/2020    IgM, serum 18 (L) 04/03/2019    IgM - External 14 08/04/2020    Kappa (Serum, Free) - External 70.6 08/04/2020    Kappa Lt Chain, Free 25.6 (H) 03/03/2021    Lambda (Serum, Free) - External 3.9 08/04/2020    Lambda Lt Chain, Free 2.8 (L) 03/03/2021    Kappa/Lambda Ratio 9.14 (H) 03/03/2021          PATHOLOGY:  As noted above.    RADIOLOGY:  As noted above.    ASSESSMENT &PLAN  The patient is a 60 y.o. female who presents for evaluation of myeloma.    # IgG kappa MM: Prior lines of therapy include (1) VRd --> MEL ASCT --> observation, (2) Rev --> Rd, and now (3) DARA-Pom/dex started 04/2020 for M-spike rising to 1.1. Now down to 0.8 as of 07/07/2020, although some of this may be DARA interference of course. Had brief increase in [facility] around time of breast surgery 09/2020 (when surgery was on hold), but then resumed thereafter. 02/2021 SPEP M protein and SFLC downtrending.    - Patient has history of allergic reactions and HAE (was monitored 1-3 hrs after first doses, got Singulair pre-med initially) but no issues so far with DARA.   - Cont DARA-Pd  DARA once a month   Continue Pom 85m daily, 21 of 28 days  Dex once weekly, 21 of 28 days   - Continue monthly MM labs    # Anxiety, emotional distress: Causes of stress include her role as sole caregiver to her son (intellectually disabled, severe hyperparathyroidism), husband (diabetes), and herself. Also stressed about the myeloma itself and its prognosis, and also about COVID.   - Will refer to SMS as a start   - Can consider onchopsychology referral down the line    # Constipation: Will start with senna & colace.    # Gr3 neutropenia: On 11/06/20 labs ([id number]) isolated. Possibly pom-related. No fevers or infectious symptoms. 03/03/2021 [place].   - Continue to monitor    # R breast lesion: Noted on 12/2019 PET-CT, then confirmed on 04/2020 R breast [place] She got the breast biopsy 07/08/2020 showed papillary  lesion, recommending excisional biopsy which returned as benign, per patient   - follow up Dr. JIona Coach(local oncologist) and Dr. KMaudie Mercury(surgeon)    # Hereditary angioedema:   - Follows with allergist locally   - No issues with DARA-Pd    # History of PEs: Occurred while on Revlimid.   - Continue low-dose Eliquis indefinitely, particularly while on Pom    #Bone health  - if she did not finish 2 years of zometa, then would recommend finishing a total of 2 years AFTER she finishes the dental work she needs currently    #Ppx:  - Continue acyclovir   - Continue Eliquis  - recommend flu shot  - PB[place]#1, #2 done, covid ab neg  - COVID #3 08/14/2020, just got her COVID #4 vaccine   - repeat covid ab 4 weeks after this shot which on 03/03/2021 was low  - recommend evusheld    #Follow-up: 4 weeks    Thank you for involving me in the care of your patient. Please do not hesitate to call me anytime to discuss this patient.    SKennon Rounds MD  Assistant Clinical Professor  [facility]ology/[facility] and [facility]  Cc:    Dr. Iona Coach (Heme)  Dr. Herbert Moors (Surgeon)      Problems: patient's active cancer represents a life-threatening illness  Risk of complications, morbidity/mortality of patient management: high; the patient's systemic cancer therapy requires regular and intensive monitoring for potential major/life-threatening toxicities  Data review/interpretation: I reviewed and/or ordered 3+ tests and/or documents    I performed this evaluation using real-time telehealth tools, including a live video Zoom connection between my location and the patient's location. Prior to initiating, the patient consented to perform this evaluation using telehealth tools.

## 2021-03-24 ENCOUNTER — Ambulatory Visit: Admit: 2021-03-26 | Payer: PRIVATE HEALTH INSURANCE

## 2021-03-24 DIAGNOSIS — F419 Anxiety disorder, unspecified: Secondary | ICD-10-CM

## 2021-03-24 DIAGNOSIS — Z515 Encounter for palliative care: Secondary | ICD-10-CM

## 2021-03-24 MED ORDER — HYDROXYZINE HCL 25 MG TABLET
25 | ORAL_TABLET | Freq: Three times a day (TID) | ORAL | 1 refills | 30.00000 days | Status: AC | PRN
Start: 2021-03-24 — End: 2022-01-09

## 2021-03-24 NOTE — Progress Notes (Unsigned)
Subjective    Ariel Braun is a 60 y.o. female with *** (diagnosis category: {Palliative Clinic GM:010272}) who is being seen {Visit Type:38872::"via video visit"} while patient is at {Patient location:38873::"home"} at the request of Not Confirmed Provider of (referral category: {Palliative Referral Source:29676::"Oncology/Rad Onc/Onc Surgery"}) for {Palliative Referral Reason:29677}.    Medical team: ***  Primary caregiver(s):  ***  Palliative care team member disciplines: {Palliative Disciplines:210576::"Physician"}    HPI  Ariel Braun is a 61 y.o. female with ***    Today patient/family reports:     -feeling OK.      -has a lot of anxiety.  Since Nov.  Almost weekly.  Has adult son who has intellectual disability.  Also cared for at [facility]. Hard to find correct dx for him despite extensive work up, second opinions.  Bone issues cont to develop.  Had knee flare recently and seeing local orthopedic doctor, who feels they need care at [facility].  Son has passed 40 renal stones.  Ariel Braun used to care for him on her own but now daughter helping.   Son has pit tumor that is being evaluated further on Friday.  Son Ariel Braun.  Age 33.  Started with pain.  Severe kyphosis.  Scoliosis.  One leg longer than the other and curled toes.  Benign bone tumor.  Tested neg MEN1.  -was worried that because no diagnosis when little, would be too late to help him.  Concerned what is going on with his knee.  Feels helpless re: bone changes.  Scalp growths.  Hyperparathyroidism- has parathyroid gland in his chest.  Dr. Janese Banks.  Endocrine.  Wants to go to specialized clinic at [facility] for rare diseases.  [facility] of records at [facility].      -live 2 hours away from [place].      -daughter works at night with her.  [place] has signed up with 4 different myeloma foundations.      -elderly mom- husband caring for her. Daughter is helping.      -dex weekly  Eliquis daily.  Pomelist q3 weeks.  Acyclovir to reduce risk of shingles.  Darzilumab q month    -used to  walk but not since COVID.     -says had 4 Pzifer vaccine and no protection.      -pain that starts in upper back and rads to front of chest.  Will stay there.  Can feel N or have diarrhea.  Breathing exercises help.  Has ativan 0.5mg .  Took Ativan or Xanax for 1 year when dx with MM and stopped working.   Stopped it.      -uses prayer and meditation to help cope. Crochet.  Watches [employer].     -wants to take non-addictive med on as needed basis.  Does not want to be in fog.  Also had strange dreams. Needs mind to be clear when decision making for her son.      -lost father and cares for her mom.   Has not used any anti-depressants recently.      -hereditary angioedema.  Can get in face, throat and stomach.  Carries with her Icatibant to use if has attack.      -stress can bring on attack.  Has seen 3 psychologists over the years.  Did not feel helped her.  Would journal.  [place] 1st generation.  Says daughter is supposed to care for everything.     Ativan prn.  0.5mg .       Social history  most significant for:    Husband is not emotionally supportive.      Fam hx: Strong fam hx of addiction.  father with alcoholic.  Mom used valium to cope.  Brothers with drug and alcohol addiction.      Patient/Family screened for spiritual care needs: {Screening:38875}  Patient/Family screened for psychosocial needs: {Screening:38875}    .smsu       Objective    Physical Exam:   ***  Performance status (by Palliative Performance Scale): {VHQ:469629528}      Review of Prior Testing  I have personally reviewed and interpreted the following studies:  Lab Results   Component Value Date    WBC Count 3.0 (L) 03/03/2021    Hemoglobin 12.4 03/03/2021    Hematocrit 38.0 03/03/2021    MCV 92 03/03/2021    Platelet Count 224 03/03/2021     Lab Results   Component Value Date    Sodium, Serum / Plasma 144 03/03/2021    Potassium, Serum / Plasma 3.1 (L) 03/03/2021    Urea Nitrogen, Serum / Plasma 17 03/03/2021    Creatinine 0.73  03/03/2021     Lab Results   Component Value Date    TSH 0.98 11/12/2016     No results found.    #  Cancer stage at diagnosis  Cancer Staging             *** Please stage this patients cancer by using the 'Cancer Staging' hyperlink above.      Assessment and Plan    Ariel Braun is a 60 y.o. female with *** who presents to the symptom management service with ***.      #  Pain  Pain assessment for this encounter  There were no vitals filed for this visit.   Pain management plan outlined below as of 03/24/21   {Pain management options list (Cancer Center):38802}    Advance Care Planning   - Prognostic awareness: ***  - Relevant values/priorities: ***  - Surrogate decision maker: {Surrogate decision UXLKG:401027253}  - Resuscitation preferences: {Palliative Care Code Status:210959}  -  AD: {Discussed/Completed/Deferred:38879}  - POLST: {Discussed/Completed/Deferred:38879}    - {GUY:40347}  - Today I spent *** minutes with the patient specifically discussing ACP.       Time Spent  I spent a total of *** minutes on this patient's care on the day of their visit excluding time spent related to any billed procedures. This time includes time spent with the patient as well as time spent documenting in the medical record, reviewing patient's records and tests, obtaining history, placing orders, communicating with other healthcare professionals, counseling the patient, family, or caregiver, and/or care coordination for the diagnoses above.        I performed this evaluation using real-time telehealth tools, including a live video Zoom connection between my location and the patient's location. Prior to initiating, the patient consented to perform this evaluation using telehealth tools.     {Failed Video Visit Note (optional):[phone]::" "}           Thank you for allowing [place] to participate in the care of your patient. Please feel free to reach out with any questions, concerns or ideas about additional ways we could serve  your patient & their family.    Follow-up in *** weeks for ***    Sima Matas, MD

## 2021-04-01 LAB — COMPREHENSIVE METABOLIC PANEL (BMP, AST, ALT, T.BILI, ALKP, TP ALB)
AST: 21 IU/L (ref 0–40)
Alanine transaminase: 19 IU/L (ref 0–32)
Albumin, Serum / Plasma: 3.8 g/dL (ref 3.8–4.8)
Albumin/Globulin Ratio: 2 (ref 1.2–2.2)
Alkaline Phosphatase: 110 IU/L (ref 44–121)
BUN/Creatinine Ratio (External Lab): 13 (ref 12–28)
Bilirubin, Total: 0.7 mg/dL (ref 0.0–1.2)
Calcium, total, Serum / Plasma: 8.9 mg/dL (ref 8.7–10.3)
Carbon Dioxide, Total: 21 mmol/L (ref 20–29)
Chloride, Serum / Plasma: 106 mmol/L (ref 96–106)
Creatinine: 0.77 mg/dL (ref 0.57–1.00)
Globulin, Total: 1.9 g/dL (ref 1.5–4.5)
Glucose, non-fasting: 103 mg/dL — ABNORMAL HIGH (ref 65–99)
Potassium, Serum / Plasma: 3.5 mmol/L (ref 3.5–5.2)
Protein, Total, Serum / Plasma: 5.7 g/dL — ABNORMAL LOW (ref 6.0–8.5)
Sodium, Serum / Plasma: 141 mmol/L (ref 134–144)
Urea Nitrogen, Serum / Plasma: 10 mg/dL (ref 8–27)
eGFRcr: 88 mL/min/{1.73_m2} (ref >59)

## 2021-04-01 LAB — COMPLETE BLOOD COUNT WITH DIFFERENTIAL
% Basophils: 2 %
% Eosinophils: 2 %
% Immature Granulocytes: 1 %
% Lymphocytes: 35 %
% Monocytes: 10 %
% Neutrophils: 50 %
Abs Basophils: 0 10*3/uL (ref 0.0–0.2)
Abs Eosinophils: 0 10*3/uL (ref 0.0–0.4)
Abs Lymphocytes: 0.7 10*3/uL (ref 0.7–3.1)
Abs Monocytes: 0.2 10*3/uL (ref 0.1–0.9)
Abs Neutrophils: 1 10*3/uL — ABNORMAL LOW (ref 1.4–7.0)
Hematocrit: 39.4 % (ref 34.0–46.6)
Hemoglobin: 12.9 g/dL (ref 11.1–15.9)
MCH: 29.5 pg (ref 26.6–33.0)
MCHC: 32.7 g/dL (ref 31.5–35.7)
MCV: 90 fL (ref 79–97)
Platelet Count: 231 10*3/uL (ref 150–450)
RBC Count: 4.37 x10E6/uL (ref 3.77–5.28)
RDW: 15.6 % — ABNORMAL HIGH (ref 11.7–15.4)
WBC Count: 1.9 10*3/uL — CL (ref 3.4–10.8)

## 2021-04-01 LAB — PROTEIN ELECTROPHORESIS, SERUM
A/G Ratio: 1.2 (ref 0.7–1.7)
Albumin, SPEP: 3.3 g/dL (ref 2.9–4.4)
Alpha 1 Globulin: 0.4 g/dL (ref 0.0–0.4)
Alpha 2 Globulin: 0.8 g/dL (ref 0.4–1.0)
Beta Globulin: 0.9 g/dL (ref 0.7–1.3)
Gamma Globulin: 0.6 g/dL (ref 0.4–1.8)
Globulin, Total: 2.7 g/dL (ref 2.2–3.9)
M-Spike, serum: 0.5 g/dL — ABNORMAL HIGH
Protein, Total, Serum / Plasma: 6 g/dL (ref 6.0–8.5)

## 2021-04-01 LAB — IGM, SERUM: IgM: 10 mg/dL — ABNORMAL LOW (ref 26–217)

## 2021-04-01 LAB — KAPPA AND LAMBDA FREE LIGHT CHAINS, SERUM
Kappa Lt Chain, Free: 34.4 mg/L — ABNORMAL HIGH (ref 3.3–19.4)
Kappa/Lambda Ratio: 8.82 — ABNORMAL HIGH (ref 0.26–1.65)
Lambda Lt Chain, Free: 3.9 mg/L — ABNORMAL LOW (ref 5.7–26.3)

## 2021-04-01 LAB — LACTATE DEHYDROGENASE, BLOOD: Lactate Dehydrogenase: 181 IU/L (ref 119–226)

## 2021-04-01 LAB — IGA, SERUM: IgA: 14 mg/dL — ABNORMAL LOW (ref 87–352)

## 2021-04-01 LAB — IGG, SERUM: IgG: 725 mg/dL (ref 586–1602)

## 2021-04-07 ENCOUNTER — Ambulatory Visit: Admit: 2021-04-07 | Payer: PRIVATE HEALTH INSURANCE | Attending: Nurse Practitioner

## 2021-04-07 DIAGNOSIS — C9000 Multiple myeloma not having achieved remission: Secondary | ICD-10-CM

## 2021-04-07 DIAGNOSIS — D849 Immunodeficiency, unspecified: Secondary | ICD-10-CM

## 2021-04-07 NOTE — Patient Instructions (Addendum)
Continue Dara/Pom/Dex with local hematologist    Continue monthly myeloma labs    Please call Dr. Sabra Heck office if having any infection symptoms    Covid #5 vaccine (this is considered a 2nd booster) due 05/2021    Follow up with Dr. Jacelyn Grip in 2 months

## 2021-04-07 NOTE — Progress Notes (Signed)
This is an independent service.  The available consultant for this service is [facility] Carolynn Serve, MD.           Patient Name:  Ariel Braun    DOB:  1960-12-12    MEDICAL RECORD NUMBER [id number]    Date of Visit: 04/07/2021     Today I saw Ariel Braun in the [facility] Hematology/BMT Clinic, in follow up regarding myeloma. She has been referred internally by Dr. Eliberto Ivory. I have reviewed her medical records from [place] and will summarize them here for our records.    CHIEF COMPLAINT: Myeloma    SUMMARY OF HEMATOLOGIC HISTORY:  Ariel Braun is a 60 y.o. female who presents with myeloma. 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 M-spike 32, kappa 596.9 mg/L, IgG 4750, b2M 3.88. She was treated with VRd x4 (achieving VGPR), then MEL 200 ASCT 05/12/11, then observation alone. 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 She met criteria for PD on 02/19/2020 and was started on third-line DARA-Pd in June 2021.       INTERVAL HISTORY:     - Started DARA-Pd 04/2020 (pom 30m 21/28 days starting 05/14/20, dara starting 05/16/2020), which she is tolerating well. No allergic reactions or symptoms of an HAE flare. Occasional diarrhea or constipation, mild. Some mild fatigue. No recent infection.  Occasional insomnia.      - Issues currently include problems sleeping and constipation. She attributes her insomnia to anxiety about her disease, her son Ariel Braun(who is intellectually disabled and has hyperparathyroidism with several kidney stones but he is getting better), and fears of COVID.     - She is the sole caregiver to her husband (diabetes) and son (as above), and unfortunately her family and church have not been as supportive as she had hoped. He has knee swelling and she is dealing with his med issues.        ALLERGIES:  Allergies/Contraindications   Allergen Reactions    Caffeine Other (See Comments)     jitters       MEDICATIONS:  Current Outpatient  Medications   Medication Sig Dispense Refill    acyclovir (ZOVIRAX) 400 mg tablet Take 1 tablet (400 mg total) by mouth Twice a day 180 tablet 3    amoxicillin (AMOXIL) 500 mg capsule Take 4 capsules (2,000 mg total) by mouth daily as needed (1 hour prior to dental procedures) 4 capsule 3    apixaban (ELIQUIS) 5 mg tablet Take 5 mg by mouth 2 (two) times daily      cholestyramine light (CHOLESTYRAMINE LIGHT) 4 gram packet Take 1 packet (4 g total) by mouth Twice a day 180 packet 3    dexAMETHasone (DECADRON) 4 mg tablet TAKE 2 TABLETS (8 MG TOTAL) BY MOUTH EVERY 7 DAYS. (Patient taking differently: Take 12 mg by mouth every 7 (seven) days   ) 24 tablet 3    ecallantide (KALBITOR) 10 mg/mL (1 mL) SOLN Inject into the skin once as needed.      EPINEPHrine (EPIPEN) 0.3 mg/0.3 mL (1:1,000) injection Inject 0.3 mg into the muscle once as needed. Use as instructed (Patient not taking: Reported on 10/14/2020  )      hydrOXYzine (ATARAX) 25 mg tablet Take 0.5-1 tablets (12.5-25 mg total) by mouth every 8 (eight) hours as needed for Anxiety 30 tablet 1    icatibant (FIRAZYR) 30 mg/3 mL SYRINGE  Inject into the skin once as needed.      LORazepam (ATIVAN) 1 mg tablet Take 1 mg by mouth every 6 (six) hours as needed      metoclopramide HCl (REGLAN) 10 mg tablet Take 10 mg by mouth once as needed. (Patient not taking: Reported on 10/14/2020  )      multivitamin tablet Take 1 tablet by mouth Daily.       ondansetron (ZOFRAN) 8 mg tablet Take 1 tablet (8 mg total) by mouth every 8 (eight) hours as needed. (Patient not taking: Reported on 10/14/2020  ) 30 tablet 2    pomalidomide 2 mg CAP Take 2 mg by mouth daily 21/28 days        predniSONE (DELTASONE) 20 mg tablet Take 20 mg by mouth once as needed. (Patient not taking: Reported on 10/14/2020  )       No current facility-administered medications for this visit.       PAST MEDICAL HISTORY:  Past Medical History:   Diagnosis Date    Acid reflux disease     Anxiety      Depression     Hereditary angioedema     Multiple myeloma, without mention of having achieved remission     IgG kappa    Myeloma 05/10/2011       PAST SURGICAL HISTORY:  Past Surgical History:   Procedure Laterality Date    AUTOLOGOUS STEM CELL TRANSPLANTATION  05/12/11    BREAST CYST EXCISION  1981   R breast surgery on 10/02/20 (path negative)    FAMILY HISTORY:  Family History   Problem Relation Name Age of Onset    Diabetes Mother      Hypertension Mother      Colon cancer Father      Hypertension Brother ##Brother1        SOCIAL HISTORY:  Social History     Socioeconomic History    Marital status: Married   Tobacco Use    Smoking status: Never Smoker    Smokeless tobacco: Never Used   Substance and Sexual Activity    Alcohol use: No    Drug use: No    Sexual activity: Not Currently       PHYSICAL EXAM:  PHYSICAL EXAM VIA VIDEO VISIT:  ECOG Performance Status: 1 - Symptomatic but completely ambulatory   Vital Signs: No vitals  General:  Awake, alert, pleasantly conversant female in no acute distress  HENT:  Normocephalic, Atraumatic, Bilateral external ears normal, No oral exudates, Nose normal. No stridor. No thrush  Eyes:  EOMI, Conjunctiva normal, No discharge  Respiratory:  Normal breathing, No respiratory distress  Integument:  No rash on visible skin  Neurologic:  Alert & oriented x 3  Psychiatric:  Affect normal, Judgment normal      LABORATORY DATA:  Prot Electrophoresis   Date Value Ref Range Status   08/03/2019 Abnormal (A) Normal pattern. 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 Paraprotein spike = 1.2 g/dL.  Interpretation by Pathologist: Clelia Schaumann, M.D.       Paraprotein Concentration   Date Value Ref Range Status   08/03/2019 1.2 (A) Negative g/dL Final     SPEP - External   Date Value Ref Range Status   08/04/2020 0.8  Final     Immunofixation Electrophoresis, serum   Date Value Ref Range Status   12/12/2018 IgG  Kappa (A) Negative  Final     Comment:     Paraprotein present, IgG Kappa  Interpretation by Pathologist: Clelia Schaumann, M.D.       Lab Results   Component Value Date    IgA 14 (L) 03/31/2021    IgA - External 12 08/04/2020    IgG, serum 1,490 08/03/2019    IgG - External 989 08/04/2020    IgM, serum 18 (L) 04/03/2019    IgM - External 14 08/04/2020    Kappa (Serum, Free) - External 70.6 08/04/2020    Kappa Lt Chain, Free 34.4 (H) 03/31/2021    Lambda (Serum, Free) - External 3.9 08/04/2020    Lambda Lt Chain, Free 3.9 (L) 03/31/2021    Kappa/Lambda Ratio 8.82 (H) 03/31/2021          PATHOLOGY:  As noted above.    RADIOLOGY:  As noted above.    ASSESSMENT &PLAN  The patient is a 60 y.o. female who presents for evaluation of myeloma.    # IgG kappa MM: Prior lines of therapy include (1) VRd --> MEL ASCT --> observation, (2) Rev --> Rd, and now (3) DARA-Pom/dex started 04/2020 for M-spike rising to 1.1. Now down to 0.8 as of 07/07/2020, although some of this may be DARA interference of course. Had brief increase in [facility] around time of breast surgery 09/2020 (when surgery was on hold), but then resumed thereafter. 02/2021 SPEP M protein and SFLC downtrending.    - Patient has history of allergic reactions and HAE (was monitored 1-3 hrs after first doses, got Singulair pre-med initially) but no issues so far with DARA.   - Cont DARA-Pd  DARA once a month   Continue Pom 43m daily, 21 of 28 days  Dex once weekly, 21 of 28 days  - 03/31/21 local labs reviewed.  M protein and light chains stable.   - Continue monthly MM labs    # Anxiety, emotional distress: Causes of stress include her role as sole caregiver to her son (intellectually disabled, severe hyperparathyroidism), husband (diabetes), and herself. Also stressed about the myeloma itself and its prognosis, and also about COVID.   - Will refer to SMS as a start   - Can consider onchopsychology referral down the line    # Constipation: Will start with senna &  colace.    # Gr3 neutropenia: On 11/06/20 labs ([id number]) isolated. Possibly pom-related. No fevers or infectious symptoms. [place] typically borderline, around 1000.    - Patient advised to monitor for infection symptoms and notify local hematologist immediately  - continue zarxio for [place]<1000   - Continue to monitor    # R breast lesion: Noted on 12/2019 PET-CT, then confirmed on 04/2020 R breast [place] She got the breast biopsy 07/08/2020 showed papillary lesion, recommending excisional biopsy which returned as benign, per patient   - follow up Dr. JIona Coach(local oncologist) and Dr. KMaudie Mercury(surgeon)    # Hereditary angioedema:   - Follows with allergist locally   - No issues with DARA-Pd    # History of PEs: Occurred while on Revlimid.   - Continue low-dose Eliquis indefinitely, particularly while on Pom    #Bone health  - if she did not finish 2 years of zometa, then would recommend finishing a total of 2 years AFTER she finishes the dental work she needs currently    #Ppx:  - Continue acyclovir   - Continue Eliquis  - recommend flu shot  - [place]p [place]1  02/26/20, #2 03/18/20, $3 08/14/20, #4 02/02/21, #5 due 05/2021  - 11/06/20 covid ab neg  - repeat covid Ab 03/03/21 was neg   - s/p evusheld 04/01/21     #Follow-up: 8 weeks    Thank you for involving me in the care of your patient. Please do not hesitate to call me anytime to discuss this patient.    Kathrynn Running NP      Cc:    Dr. Iona Coach (Heme)  Dr. Herbert Moors (Surgeon)      This patient has a high complexity problem: multiple myeloma, which is an incurable malignancy and poses a threat to life and bodily function.       Regarding data complexity, I have reviewed the following tests: CBC, CMP, SPEP, BM biopsy, light chains  I have also discussed the case with the patient's local oncologist, Dr. Myrtie Hawk to confirm the plan as stated above.      Medical decision making: high risk.   On active treatment, requires close monitoring due to potential serious side effects of treatment  and immunocompromised state. Requires frequent labs and assessment. At risk for progression.Therefore the patient must  be closely monitored for toxicities, which may include pancytopenia, infection, nausea, vomiting, diarrhea.  The patient was extensively counseled on this and agrees to proceed with the treatment plan.     I performed this evaluation using real-time telehealth tools, including a live video Zoom connection between my location and the patient's location. Prior to initiating, the patient consented to perform this evaluation using telehealth tools.

## 2021-04-17 NOTE — Telephone Encounter (Incomplete)
OUTPATIENT HEM/BMT CLINIC SW NOTE:    Data:  Ariel Braun is a 60 year-old female patient being followed by Dr. Jacelyn Grip at the outpatient Hematology/BMT Clinic for dx of myeloma.     Assessment:  SW attempted to reach pt by phone. SW left a voicemail.    Plan:  SW provided pt with contact information.   SW remains available for support and advocacy.      Neila Gear, MSW  Clinical Social Worker   [phone]

## 2021-04-21 NOTE — Telephone Encounter (Incomplete)
OUTPATIENT HEM/BMT CLINIC SW NOTE:    Data:  Ariel Braun is a 60 year-old female patient being followed by Dr. Jacelyn Grip at the outpatient Hematology/BMT Clinic for dx of myeloma.     Pt was referred to SW for additional support.     Assessment:  SW previously attempted to reach pt by phone and was unsuccessful.    SW attempted to reach pt again by phone. There was ansower. SW left a voicemail.    Plan:  SW provided pt with contact information.   SW remains available for support and advocacy.      Neila Gear, MSW  Clinical Social Worker   [phone]

## 2021-04-30 LAB — COMPLETE BLOOD COUNT WITH DIFFERENTIAL
% Basophils: 1 %
% Eosinophils: 2 %
% Immature Granulocytes: 1 %
% Lymphocytes: 31 %
% Monocytes: 18 %
% Neutrophils: 47 %
Abs Basophils: 0 10*3/uL (ref 0.0–0.2)
Abs Eosinophils: 0 10*3/uL (ref 0.0–0.4)
Abs Lymphocytes: 0.7 10*3/uL (ref 0.7–3.1)
Abs Monocytes: 0.4 10*3/uL (ref 0.1–0.9)
Abs Neutrophils: 1.1 10*3/uL — ABNORMAL LOW (ref 1.4–7.0)
Hematocrit: 37.2 % (ref 34.0–46.6)
Hemoglobin: 12 g/dL (ref 11.1–15.9)
MCH: 29.2 pg (ref 26.6–33.0)
MCHC: 32.3 g/dL (ref 31.5–35.7)
MCV: 91 fL (ref 79–97)
Platelet Count: 242 10*3/uL (ref 150–450)
RBC Count: 4.11 x10E6/uL (ref 3.77–5.28)
RDW-CV: 16 % — ABNORMAL HIGH (ref 11.7–15.4)
WBC Count: 2.3 10*3/uL — CL (ref 3.4–10.8)

## 2021-04-30 LAB — IGG, SERUM: IgG: 687 mg/dL (ref 586–1602)

## 2021-04-30 LAB — IGM, SERUM: IgM, Serum: 6 mg/dL — ABNORMAL LOW (ref 26–217)

## 2021-04-30 LAB — COMPREHENSIVE METABOLIC PANEL (BMP, AST, ALT, T.BILI, ALKP, TP ALB)
AST: 25 IU/L (ref 0–40)
Alanine transaminase: 30 IU/L (ref 0–32)
Albumin, Serum / Plasma: 3.9 g/dL (ref 3.8–4.8)
Albumin/Globulin Ratio: 2.4 — ABNORMAL HIGH (ref 1.2–2.2)
Alkaline Phosphatase: 107 IU/L (ref 44–121)
BUN/Creatinine Ratio (External Lab): 20 (ref 12–28)
Bilirubin, Total: 0.6 mg/dL (ref 0.0–1.2)
Calcium, total, Serum / Plasma: 8.9 mg/dL (ref 8.7–10.3)
Carbon Dioxide, Total: 21 mmol/L (ref 20–29)
Chloride, Serum / Plasma: 106 mmol/L (ref 96–106)
Creatinine: 0.74 mg/dL (ref 0.57–1.00)
Globulin, Total: 1.6 g/dL (ref 1.5–4.5)
Glucose, non-fasting: 87 mg/dL (ref 65–99)
Potassium, Serum / Plasma: 3.4 mmol/L — ABNORMAL LOW (ref 3.5–5.2)
Protein, Total, Serum / Plasma: 5.5 g/dL — ABNORMAL LOW (ref 6.0–8.5)
Sodium, Serum / Plasma: 141 mmol/L (ref 134–144)
Urea Nitrogen, Serum / Plasma: 15 mg/dL (ref 8–27)
eGFRcr: 92 mL/min/{1.73_m2} (ref >59)

## 2021-04-30 LAB — KAPPA AND LAMBDA FREE LIGHT CHAINS, SERUM
Kappa Light Chain, Serum, Free: 29.9 mg/L — ABNORMAL HIGH (ref 3.3–19.4)
Kappa/Lambda Ratio, Serum, Free: 11.96 — ABNORMAL HIGH (ref 0.26–1.65)
Lambda Light Chain, Serum, Free: 2.5 mg/L — ABNORMAL LOW (ref 5.7–26.3)

## 2021-04-30 LAB — PROTEIN ELECTROPHORESIS, SERUM
A/G Ratio: 1.2 (ref 0.7–1.7)
Albumin, sPEP: 3.1 g/dL (ref 2.9–4.4)
Alpha 1 Globulin: 0.3 g/dL (ref 0.0–0.4)
Alpha 2 Globulin: 0.7 g/dL (ref 0.4–1.0)
Beta Globulin: 0.9 g/dL (ref 0.7–1.3)
Gamma Globulin: 0.7 g/dL (ref 0.4–1.8)
Globulin, Total: 2.6 g/dL (ref 2.2–3.9)
M-Protein (monoclonal), Serum: 0.6 g/dL — ABNORMAL HIGH
Protein, Total, Serum / Plasma: 5.7 g/dL — ABNORMAL LOW (ref 6.0–8.5)

## 2021-04-30 LAB — LACTATE DEHYDROGENASE, BLOOD: LDH, serum/plasma: 194 [IU]/L (ref 119–226)

## 2021-04-30 LAB — IGA, SERUM: IgA, Serum: 13 mg/dL — ABNORMAL LOW (ref 87–352)

## 2021-05-28 LAB — COMPREHENSIVE METABOLIC PANEL (BMP, AST, ALT, T.BILI, ALKP, TP ALB)
AST: 62 IU/L — ABNORMAL HIGH (ref 0–40)
Alanine transaminase: 71 IU/L — ABNORMAL HIGH (ref 0–32)
Albumin, Serum / Plasma: 3.9 g/dL (ref 3.8–4.8)
Albumin/Globulin Ratio: 2.2 (ref 1.2–2.2)
Alkaline Phosphatase: 136 IU/L — ABNORMAL HIGH (ref 44–121)
BUN/Creatinine Ratio (External Lab): 18 (ref 12–28)
Bilirubin, Total: 0.9 mg/dL (ref 0.0–1.2)
Calcium, total, Serum / Plasma: 9.1 mg/dL (ref 8.7–10.3)
Carbon Dioxide, Total: 17 mmol/L — ABNORMAL LOW (ref 20–29)
Chloride, Serum / Plasma: 108 mmol/L — ABNORMAL HIGH (ref 96–106)
Creatinine: 0.79 mg/dL (ref 0.57–1.00)
Globulin, Total: 1.8 g/dL (ref 1.5–4.5)
Glucose, non-fasting: 87 mg/dL (ref 65–99)
Potassium, Serum / Plasma: 4 mmol/L (ref 3.5–5.2)
Protein, Total, Serum / Plasma: 5.7 g/dL — ABNORMAL LOW (ref 6.0–8.5)
Sodium, Serum / Plasma: 142 mmol/L (ref 134–144)
Urea Nitrogen, Serum / Plasma: 14 mg/dL (ref 8–27)
eGFRcr: 85 mL/min/{1.73_m2} (ref >59)

## 2021-05-28 LAB — PROTEIN ELECTROPHORESIS, SERUM
A/G Ratio: 1.1 (ref 0.7–1.7)
Albumin, sPEP: 3.2 g/dL (ref 2.9–4.4)
Alpha 1 Globulin: 0.3 g/dL (ref 0.0–0.4)
Alpha 2 Globulin: 0.7 g/dL (ref 0.4–1.0)
Beta Globulin: 1 g/dL (ref 0.7–1.3)
Gamma Globulin: 0.8 g/dL (ref 0.4–1.8)
Globulin, Total: 2.8 g/dL (ref 2.2–3.9)
M-Protein (monoclonal), Serum: 0.6 g/dL — ABNORMAL HIGH
Protein, Total, Serum / Plasma: 6 g/dL (ref 6.0–8.5)

## 2021-05-28 LAB — COMPLETE BLOOD COUNT WITH DIFFERENTIAL
% Basophils: 2 %
% Eosinophils: 3 %
% Immature Granulocytes: 1 %
% Lymphocytes: 29 %
% Monocytes: 14 %
% Neutrophils: 51 %
Abs Basophils: 0 10*3/uL (ref 0.0–0.2)
Abs Eosinophils: 0.1 10*3/uL (ref 0.0–0.4)
Abs Lymphocytes: 0.7 10*3/uL (ref 0.7–3.1)
Abs Monocytes: 0.4 10*3/uL (ref 0.1–0.9)
Abs Neutrophils: 1.3 10*3/uL — ABNORMAL LOW (ref 1.4–7.0)
Hematocrit: 37.9 % (ref 34.0–46.6)
Hemoglobin: 12.2 g/dL (ref 11.1–15.9)
MCH: 29 pg (ref 26.6–33.0)
MCHC: 32.2 g/dL (ref 31.5–35.7)
MCV: 90 fL (ref 79–97)
Platelet Count: 264 10*3/uL (ref 150–450)
RBC Count: 4.21 x10E6/uL (ref 3.77–5.28)
RDW-CV: 17.2 % — ABNORMAL HIGH (ref 11.7–15.4)
WBC Count: 2.5 10*3/uL — CL (ref 3.4–10.8)

## 2021-05-28 LAB — LACTATE DEHYDROGENASE, BLOOD: LDH, serum/plasma: 194 IU/L (ref 119–226)

## 2021-05-28 LAB — IGA, SERUM: IgA, Serum: 15 mg/dL — ABNORMAL LOW (ref 87–352)

## 2021-05-28 LAB — KAPPA AND LAMBDA FREE LIGHT CHAINS, SERUM
Kappa Light Chain, Serum, Free: 35.9 mg/L — ABNORMAL HIGH (ref 3.3–19.4)
Kappa/Lambda Ratio, Serum, Free: 11.97 — ABNORMAL HIGH (ref 0.26–1.65)
Lambda Light Chain, Serum, Free: 3 mg/L — ABNORMAL LOW (ref 5.7–26.3)

## 2021-05-28 LAB — IGG, SERUM: IgG: 725 mg/dL (ref 586–1602)

## 2021-05-28 LAB — IGM, SERUM: IgM, Serum: 6 mg/dL — ABNORMAL LOW (ref 26–217)

## 2021-06-08 LAB — COMPREHENSIVE METABOLIC PANEL (BMP, AST, ALT, T.BILI, ALKP, TP ALB)
AST: 26 IU/L (ref 0–40)
Alanine transaminase: 32 IU/L (ref 0–32)
Albumin, Serum / Plasma: 3.9 g/dL (ref 3.8–4.8)
Albumin/Globulin Ratio: 2.3 — ABNORMAL HIGH (ref 1.2–2.2)
Alkaline Phosphatase: 144 IU/L — ABNORMAL HIGH (ref 44–121)
BUN/Creatinine Ratio (External Lab): 13 (ref 12–28)
Bilirubin, Total: 0.9 mg/dL (ref 0.0–1.2)
Calcium, total, Serum / Plasma: 8.8 mg/dL (ref 8.7–10.3)
Carbon Dioxide, Total: 24 mmol/L (ref 20–29)
Chloride, Serum / Plasma: 106 mmol/L (ref 96–106)
Creatinine: 0.78 mg/dL (ref 0.57–1.00)
Globulin, Total: 1.7 g/dL (ref 1.5–4.5)
Glucose, non-fasting: 100 mg/dL — ABNORMAL HIGH (ref 65–99)
Potassium, Serum / Plasma: 3.3 mmol/L — ABNORMAL LOW (ref 3.5–5.2)
Protein, Total, Serum / Plasma: 5.6 g/dL — ABNORMAL LOW (ref 6.0–8.5)
Sodium, Serum / Plasma: 141 mmol/L (ref 134–144)
Urea Nitrogen, Serum / Plasma: 10 mg/dL (ref 8–27)
eGFRcr: 86 mL/min/{1.73_m2} (ref >59)

## 2021-06-08 LAB — LACTATE DEHYDROGENASE, BLOOD: LDH, serum/plasma: 158 IU/L (ref 119–226)

## 2021-06-09 ENCOUNTER — Ambulatory Visit: Admit: 2021-06-10 | Payer: PRIVATE HEALTH INSURANCE | Attending: Physician

## 2021-06-09 DIAGNOSIS — C9000 Multiple myeloma not having achieved remission: Secondary | ICD-10-CM

## 2021-06-09 NOTE — Progress Notes (Signed)
Patient Name:  Ariel Braun    DOB:  11-06-1961    MEDICAL RECORD NUMBER [id number]    Date of Visit: 06/09/2021     Today I saw Ariel Braun in the [facility] Hematology/BMT Clinic, in follow up regarding myeloma. She has been referred internally by Dr. Eliberto Ivory. I have reviewed her medical records from [place] and will summarize them here for our records.    CHIEF COMPLAINT: Myeloma    SUMMARY OF HEMATOLOGIC HISTORY:  Ariel Braun is a 60 y.o. female who presents with myeloma. 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 M-spike 32, kappa 596.9 mg/L, IgG 4750, b2M 3.88. She was treated with VRd x4 (achieving VGPR), then MEL 200 ASCT 05/12/11, then observation alone. 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 She met criteria for PD on 02/19/2020 and was started on third-line DARA-Pd in June 2021.       INTERVAL HISTORY:     - Started DARA-Pd 04/2020 (pom 81m 21/28 days starting 05/14/20, dara starting 05/16/2020), which she is tolerating well. No allergic reactions or symptoms of an HAE flare. Occasional diarrhea or constipation, mild. Some mild fatigue. No recent infection.  Occasional insomnia.      - Issues currently include problems sleeping and constipation. She attributes her insomnia to anxiety about her disease, her son [name](who is intellectually disabled and has hyperparathyroidism with several kidney stones but he is getting better), and fears of COVID.     - She is the sole caregiver to her husband (diabetes) and son (as above), and unfortunately her family and church have not been as supportive as she had hoped. He has knee swelling and she is dealing with his med issues. Has been hectic recently.     She has not had an abdominal issues. She was on her off week when her LFTs were elevated.     No fever and feeling feverish. Last week was not feeling so good      ALLERGIES:  Allergies/Contraindications   Allergen Reactions    Caffeine Other  (See Comments)     jitters       MEDICATIONS:  Current Outpatient Medications   Medication Sig Dispense Refill    acyclovir (ZOVIRAX) 400 mg tablet Take 1 tablet (400 mg total) by mouth Twice a day 180 tablet 3    amoxicillin (AMOXIL) 500 mg capsule Take 4 capsules (2,000 mg total) by mouth daily as needed (1 hour prior to dental procedures) 4 capsule 3    apixaban (ELIQUIS) 5 mg tablet Take 5 mg by mouth 2 (two) times daily      cholestyramine light (CHOLESTYRAMINE LIGHT) 4 gram packet Take 1 packet (4 g total) by mouth Twice a day 180 packet 3    dexAMETHasone (DECADRON) 4 mg tablet TAKE 2 TABLETS (8 MG TOTAL) BY MOUTH EVERY 7 DAYS. (Patient taking differently: Take 12 mg by mouth every 7 (seven) days   ) 24 tablet 3    ecallantide (KALBITOR) 10 mg/mL (1 mL) SOLN Inject into the skin once as needed.      EPINEPHrine (EPIPEN) 0.3 mg/0.3 mL (1:1,000) injection Inject 0.3 mg into the muscle once as needed. Use as instructed (Patient not taking: Reported on 10/14/2020  )      hydrOXYzine (ATARAX) 25 mg tablet Take 0.5-1 tablets (12.5-25 mg total) by mouth every 8 (eight) hours as needed for Anxiety  30 tablet 1    icatibant (FIRAZYR) 30 mg/3 mL SYRINGE Inject into the skin once as needed.      LORazepam (ATIVAN) 1 mg tablet Take 1 mg by mouth every 6 (six) hours as needed      metoclopramide HCl (REGLAN) 10 mg tablet Take 10 mg by mouth once as needed. (Patient not taking: Reported on 10/14/2020  )      multivitamin tablet Take 1 tablet by mouth Daily.       ondansetron (ZOFRAN) 8 mg tablet Take 1 tablet (8 mg total) by mouth every 8 (eight) hours as needed. (Patient not taking: Reported on 10/14/2020  ) 30 tablet 2    pomalidomide 2 mg CAP Take 2 mg by mouth daily 21/28 days        predniSONE (DELTASONE) 20 mg tablet Take 20 mg by mouth once as needed. (Patient not taking: Reported on 10/14/2020  )       No current facility-administered medications for this visit.       PAST MEDICAL HISTORY:  Past  Medical History:   Diagnosis Date    Acid reflux disease     Anxiety     Depression     Hereditary angioedema     Multiple myeloma, without mention of having achieved remission     IgG kappa    Myeloma 05/10/2011       PAST SURGICAL HISTORY:  Past Surgical History:   Procedure Laterality Date    AUTOLOGOUS STEM CELL TRANSPLANTATION  05/12/11    BREAST CYST EXCISION  1981   R breast surgery on 10/02/20 (path negative)    FAMILY HISTORY:  Family History   Problem Relation Name Age of Onset    Diabetes Mother      Hypertension Mother      Colon cancer Father      Hypertension Brother ##Brother1        SOCIAL HISTORY:  Social History     Socioeconomic History    Marital status: Married   Tobacco Use    Smoking status: Never Smoker    Smokeless tobacco: Never Used   Substance and Sexual Activity    Alcohol use: No    Drug use: No    Sexual activity: Not Currently       PHYSICAL EXAM:  PHYSICAL EXAM VIA VIDEO VISIT:  ECOG Performance Status: 1 - Symptomatic but completely ambulatory   Vital Signs: No vitals  General:  Awake, alert, pleasantly conversant female in no acute distress  HENT:  Normocephalic, Atraumatic, Bilateral external ears normal, No oral exudates, Nose normal. No stridor. No thrush  Eyes:  EOMI, Conjunctiva normal, No discharge  Respiratory:  Normal breathing, No respiratory distress  Integument:  No rash on visible skin  Neurologic:  Alert & oriented x 3  Psychiatric:  Affect normal, Judgment normal      LABORATORY DATA:  Prot Electrophoresis   Date Value Ref Range Status   08/03/2019 Abnormal (A) Normal pattern. 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 Paraprotein spike = 1.2 g/dL.  Interpretation by Pathologist: Clelia Schaumann, M.D.       Paraprotein Concentration   Date Value Ref Range Status   08/03/2019 1.2 (A) Negative g/dL Final     SPEP - External   Date Value Ref Range Status   08/04/2020 0.8  Final     Immunofixation  Electrophoresis, serum  Date Value Ref Range Status   12/12/2018 IgG Kappa (A) Negative Final     Comment:     Paraprotein present, IgG Kappa  Interpretation by Pathologist: Clelia Schaumann, M.D.       Lab Results   Component Value Date    IgA 15 (L) 05/27/2021    IgA - External 12 08/04/2020    IgG, serum 1,490 08/03/2019    IgG - External 989 08/04/2020    IgM, serum 18 (L) 04/03/2019    IgM - External 14 08/04/2020    Kappa (Serum, Free) - External 70.6 08/04/2020    Kappa Lt Chain, Free 35.9 (H) 05/27/2021    Lambda (Serum, Free) - External 3.9 08/04/2020    Lambda Lt Chain, Free 3.0 (L) 05/27/2021    Kappa/Lambda Ratio 11.97 (H) 05/27/2021          PATHOLOGY:  As noted above.    RADIOLOGY:  As noted above.    ASSESSMENT &PLAN  The patient is a 60 y.o. female who presents for evaluation of myeloma.    # IgG kappa MM: Prior lines of therapy include (1) VRd --> MEL ASCT --> observation, (2) Rev --> Rd, and now (3) DARA-Pom/dex started 04/2020 for M-spike rising to 1.1. Now down to 0.8 as of 07/07/2020, although some of this may be DARA interference of course. Had brief increase in [facility] around time of breast surgery 09/2020 (when surgery was on hold), but then resumed thereafter. 02/2021 SPEP M protein and SFLC downtrending. 06/08/2021 MM labs stable   - Patient has history of allergic reactions and HAE (was monitored 1-3 hrs after first doses, got Singulair pre-med initially) but no issues so far with DARA.   - Cont DARA-Pd  DARA once a month   Continue Pom 40m daily, 21 of 28 days  Dex once weekly, 21 of 28 days  - 05/27/2021 local labs reviewed.  M protein and light chains stable.   - Continue monthly MM labs    # Anxiety, emotional distress: Causes of stress include her role as sole caregiver to her son (intellectually disabled, severe hyperparathyroidism), husband (diabetes), and herself. Also stressed about the myeloma itself and its prognosis, and also about COVID.   - Will refer to SMS as a start   - Can  consider onchopsychology referral down the line    # Constipation: Will start with senna & colace.    # Gr3 neutropenia: On 11/06/20 labs ([id number]) isolated. Possibly pom-related. No fevers or infectious symptoms. AZincis typically borderline, around 1000.    - Patient advised to monitor for infection symptoms and notify local hematologist immediately  - continue zarxio for [place]<1000   - Continue to monitor    # R breast lesion: Noted on 12/2019 PET-CT, then confirmed on 04/2020 R breast [place] She got the breast biopsy 07/08/2020 showed papillary lesion, recommending excisional biopsy which returned as benign, per patient   - follow up Dr. JIona Coach(local oncologist) and Dr. KMaudie Mercury(surgeon)    # Hereditary angioedema:   - Follows with allergist locally   - No issues with DARA-Pd    # History of PEs: Occurred while on Revlimid.   - Continue low-dose Eliquis indefinitely, particularly while on Pom    #Bone health  - if she did not finish 2 years of zometa, then would recommend finishing a total of 2 years AFTER she finishes the dental work she needs currently    #Ppx:  - Continue acyclovir   -  Continue Eliquis  - recommend flu shot in Fall 2022  - s/p [place] #1 02/26/20, #2 03/18/20, $3 08/14/20, #4 02/02/21, #5 due 05/2021  - 11/06/20 covid ab neg  - repeat covid Ab 03/03/21 was neg   - s/p evusheld 04/01/21      #Follow-up: 8 weeks      Thank you for involving me in the care of your patient. Please do not hesitate to call me anytime to discuss this patient.    Kennon Rounds, MD  Assistant Clinical Professor  Hematology/Blood and Marrow Transplantation  [facility]        Cc:    Dr. Iona Coach (Heme)  Dr. Herbert Moors (Surgeon)          Problems: patient's active cancer represents a life-threatening illness  Risk of complications, morbidity/mortality of patient management: high; the patient's systemic cancer therapy requires regular and intensive monitoring for potential major/life-threatening toxicities  Data review/interpretation: I  reviewed and/or ordered 3+ tests and/or documents    I performed this evaluation using real-time telehealth tools, including a live video Zoom connection between my location and the patient's location. Prior to initiating, the patient consented to perform this evaluation using telehealth tools.

## 2021-06-24 LAB — COMPLETE BLOOD COUNT WITH DIFFERENTIAL
% Basophils: 2 %
% Eosinophils: 7 %
% Immature Granulocytes: 1 %
% Lymphocytes: 28 %
% Monocytes: 14 %
% Neutrophils: 48 %
Abs Basophils: 0 10*3/uL (ref 0.0–0.2)
Abs Eosinophils: 0.1 10*3/uL (ref 0.0–0.4)
Abs Lymphocytes: 0.6 10*3/uL — ABNORMAL LOW (ref 0.7–3.1)
Abs Monocytes: 0.3 10*3/uL (ref 0.1–0.9)
Abs Neutrophils: 1 10*3/uL — ABNORMAL LOW (ref 1.4–7.0)
Hematocrit: 37.4 % (ref 34.0–46.6)
Hemoglobin: 12.2 g/dL (ref 11.1–15.9)
MCH: 29.3 pg (ref 26.6–33.0)
MCHC: 32.6 g/dL (ref 31.5–35.7)
MCV: 90 fL (ref 79–97)
Platelet Count: 224 10*3/uL (ref 150–450)
RBC Count: 4.17 x10E6/uL (ref 3.77–5.28)
RDW-CV: 17.4 % — ABNORMAL HIGH (ref 11.7–15.4)
WBC Count: 2 10*3/uL — CL (ref 3.4–10.8)

## 2021-06-24 LAB — PROTEIN ELECTROPHORESIS, SERUM
A/G Ratio: 1.2 (ref 0.7–1.7)
Albumin, SPEP: 3.1 g/dL (ref 2.9–4.4)
Alpha 1 Globulin: 0.3 g/dL (ref 0.0–0.4)
Alpha 2 Globulin: 0.7 g/dL (ref 0.4–1.0)
Beta Globulin: 0.9 g/dL (ref 0.7–1.3)
Gamma Globulin: 0.6 g/dL (ref 0.4–1.8)
Globulin, Total: 2.6 g/dL (ref 2.2–3.9)
M-Protein (monoclonal), Serum: 0.5 g/dL — ABNORMAL HIGH
Protein, Total, Serum / Plasma: 5.7 g/dL — ABNORMAL LOW (ref 6.0–8.5)

## 2021-06-24 LAB — COMPREHENSIVE METABOLIC PANEL (BMP, AST, ALT, T.BILI, ALKP, TP ALB)
AST: 26 IU/L (ref 0–40)
Alanine transaminase: 33 IU/L — ABNORMAL HIGH (ref 0–32)
Albumin, Serum / Plasma: 4 g/dL (ref 3.8–4.8)
Albumin/Globulin Ratio: 2.5 — ABNORMAL HIGH (ref 1.2–2.2)
Alkaline Phosphatase: 143 IU/L — ABNORMAL HIGH (ref 44–121)
BUN/Creatinine Ratio (External Lab): 14 (ref 12–28)
Bilirubin, Total: 1.1 mg/dL (ref 0.0–1.2)
Calcium, total, Serum / Plasma: 9.2 mg/dL (ref 8.7–10.3)
Carbon Dioxide, Total: 17 mmol/L — ABNORMAL LOW (ref 20–29)
Chloride, Serum / Plasma: 109 mmol/L — ABNORMAL HIGH (ref 96–106)
Creatinine: 0.76 mg/dL (ref 0.57–1.00)
Globulin, Total: 1.6 g/dL (ref 1.5–4.5)
Glucose, non-fasting: 101 mg/dL — ABNORMAL HIGH (ref 65–99)
Potassium, Serum / Plasma: 3.7 mmol/L (ref 3.5–5.2)
Protein, Total, Serum / Plasma: 5.6 g/dL — ABNORMAL LOW (ref 6.0–8.5)
Sodium, Serum / Plasma: 142 mmol/L (ref 134–144)
Urea Nitrogen, Serum / Plasma: 11 mg/dL (ref 8–27)
eGFRcr: 89 mL/min/{1.73_m2} (ref >59)

## 2021-06-24 LAB — KAPPA AND LAMBDA FREE LIGHT CHAINS, SERUM
Kappa Light Chain, Serum, Free: 41.4 mg/L — ABNORMAL HIGH (ref 3.3–19.4)
Kappa/Lambda Ratio, Serum, Free: 13.8 — ABNORMAL HIGH (ref 0.26–1.65)
Lambda Light Chain, Serum, Free: 3 mg/L — ABNORMAL LOW (ref 5.7–26.3)

## 2021-06-24 LAB — LACTATE DEHYDROGENASE, BLOOD: LDH, serum/plasma: 197 IU/L (ref 119–226)

## 2021-06-24 LAB — IGG, SERUM: IgG: 661 mg/dL (ref 586–1602)

## 2021-06-24 LAB — IGA, SERUM: IgA, Serum: 15 mg/dL — ABNORMAL LOW (ref 87–352)

## 2021-06-24 LAB — IGM, SERUM: IgM, Serum: 7 mg/dL — ABNORMAL LOW (ref 26–217)

## 2021-07-06 NOTE — Telephone Encounter (Signed)
The patient called to let me know about watery diarrhea s/p covid vaccine which she is used to.  She has been taking prevalite from a previous Rx and has not reached out to her local oncology team "because Dr. Eliberto Ivory prescribed the medication".    I asked her to contact Dr. Sabra Heck team about it and explained that prevalite was prescribed for revlimid-related diarrhea and that she should not take any antidiarrheals until infectious causes have been ruled out and she has ben advised to do so.    She voiced understanding and will contact Dr. Sabra Heck nurse.

## 2021-07-22 LAB — COMPLETE BLOOD COUNT WITH DIFFERENTIAL
% Basophils: 2 %
% Eosinophils: 5 %
% Immature Granulocytes: 1 %
% Lymphocytes: 24 %
% Monocytes: 16 %
% Neutrophils: 52 %
Abs Basophils: 0.1 10*3/uL (ref 0.0–0.2)
Abs Eosinophils: 0.1 10*3/uL (ref 0.0–0.4)
Abs Lymphocytes: 0.5 10*3/uL — ABNORMAL LOW (ref 0.7–3.1)
Abs Monocytes: 0.3 10*3/uL (ref 0.1–0.9)
Abs Neutrophils: 1.1 10*3/uL — ABNORMAL LOW (ref 1.4–7.0)
Hematocrit: 35.7 % (ref 34.0–46.6)
Hemoglobin: 11.7 g/dL (ref 11.1–15.9)
MCH: 29.2 pg (ref 26.6–33.0)
MCHC: 32.8 g/dL (ref 31.5–35.7)
MCV: 89 fL (ref 79–97)
Platelet Count: 255 10*3/uL (ref 150–450)
RBC Count: 4.01 x10E6/uL (ref 3.77–5.28)
RDW-CV: 17.7 % — ABNORMAL HIGH (ref 11.7–15.4)
WBC Count: 2.2 10*3/uL — CL (ref 3.4–10.8)

## 2021-07-22 LAB — COMPREHENSIVE METABOLIC PANEL (BMP, AST, ALT, T.BILI, ALKP, TP ALB)
AST: 36 IU/L (ref 0–40)
Alanine transaminase: 46 IU/L — ABNORMAL HIGH (ref 0–32)
Albumin, Serum / Plasma: 3.8 g/dL (ref 3.8–4.8)
Albumin/Globulin Ratio: 2.2 (ref 1.2–2.2)
Alkaline Phosphatase: 137 IU/L — ABNORMAL HIGH (ref 44–121)
BUN/Creatinine Ratio (External Lab): 14 (ref 12–28)
Bilirubin, Total: 1.1 mg/dL (ref 0.0–1.2)
Calcium, total, Serum / Plasma: 8.6 mg/dL — ABNORMAL LOW (ref 8.7–10.3)
Carbon Dioxide, Total: 21 mmol/L (ref 20–29)
Chloride, Serum / Plasma: 107 mmol/L — ABNORMAL HIGH (ref 96–106)
Creatinine: 0.72 mg/dL (ref 0.57–1.00)
Globulin, Total: 1.7 g/dL (ref 1.5–4.5)
Glucose, non-fasting: 88 mg/dL (ref 65–99)
Potassium, Serum / Plasma: 3.2 mmol/L — ABNORMAL LOW (ref 3.5–5.2)
Protein, Total, Serum / Plasma: 5.5 g/dL — ABNORMAL LOW (ref 6.0–8.5)
Sodium, Serum / Plasma: 142 mmol/L (ref 134–144)
Urea Nitrogen, Serum / Plasma: 10 mg/dL (ref 8–27)
eGFRcr: 95 mL/min/{1.73_m2} (ref >59)

## 2021-07-22 LAB — LACTATE DEHYDROGENASE, BLOOD: LDH, serum/plasma: 159 [IU]/L (ref 119–226)

## 2021-07-23 LAB — KAPPA AND LAMBDA FREE LIGHT CHAINS, SERUM
Kappa Light Chain, Serum, Free: 29 mg/L — ABNORMAL HIGH (ref 3.3–19.4)
Kappa/Lambda Ratio, Serum, Free: 12.08 — ABNORMAL HIGH (ref 0.26–1.65)
Lambda Light Chain, Serum, Free: 2.4 mg/L — ABNORMAL LOW (ref 5.7–26.3)

## 2021-07-23 LAB — PROTEIN ELECTROPHORESIS, SERUM
A/G Ratio: 1.2 (ref 0.7–1.7)
Albumin, SPEP: 3 g/dL (ref 2.9–4.4)
Alpha 1 Globulin: 0.3 g/dL (ref 0.0–0.4)
Alpha 2 Globulin: 0.7 g/dL (ref 0.4–1.0)
Beta Globulin: 0.9 g/dL (ref 0.7–1.3)
Gamma Globulin: 0.6 g/dL (ref 0.4–1.8)
Globulin, Total: 2.5 g/dL (ref 2.2–3.9)
M-Protein (monoclonal), Serum: 0.5 g/dL — ABNORMAL HIGH
Protein, Total, Serum / Plasma: 5.5 g/dL — ABNORMAL LOW (ref 6.0–8.5)

## 2021-07-23 LAB — IGA, SERUM: IgA, Serum: 13 mg/dL — ABNORMAL LOW (ref 87–352)

## 2021-07-23 LAB — IGG, SERUM: IgG: 653 mg/dL (ref 586–1602)

## 2021-07-23 LAB — IGM, SERUM: IgM, Serum: <5 mg/dL — ABNORMAL LOW (ref 26–217)

## 2021-08-18 LAB — COMPREHENSIVE METABOLIC PANEL (BMP, AST, ALT, T.BILI, ALKP, TP ALB)
AST: 37 IU/L (ref 0–40)
Alanine transaminase: 40 IU/L — ABNORMAL HIGH (ref 0–32)
Albumin, Serum / Plasma: 3.8 g/dL (ref 3.8–4.8)
Albumin/Globulin Ratio: 2.2 (ref 1.2–2.2)
Alkaline Phosphatase: 130 IU/L — ABNORMAL HIGH (ref 44–121)
BUN/Creatinine Ratio (External Lab): 23 (ref 12–28)
Bilirubin, Total: 0.8 mg/dL (ref 0.0–1.2)
Calcium, total, Serum / Plasma: 9 mg/dL (ref 8.7–10.3)
Carbon Dioxide, Total: 25 mmol/L (ref 20–29)
Chloride, Serum / Plasma: 106 mmol/L (ref 96–106)
Creatinine: 0.82 mg/dL (ref 0.57–1.00)
Globulin, Total: 1.7 g/dL (ref 1.5–4.5)
Glucose, non-fasting: 88 mg/dL (ref 70–99)
Potassium, Serum / Plasma: 2.9 mmol/L — ABNORMAL LOW (ref 3.5–5.2)
Protein, Total, Serum / Plasma: 5.5 g/dL — ABNORMAL LOW (ref 6.0–8.5)
Sodium, Serum / Plasma: 143 mmol/L (ref 134–144)
Urea Nitrogen, Serum / Plasma: 19 mg/dL (ref 8–27)
eGFRcr: 81 mL/min/{1.73_m2} (ref >59)

## 2021-08-18 LAB — COMPLETE BLOOD COUNT WITH DIFFERENTIAL
% Basophils: 0 %
% Eosinophils: 2 %
% Immature Granulocytes: 2 %
% Lymphocytes: 17 %
% Monocytes: 15 %
% Neutrophils: 64 %
Abs Basophils: 0 10*3/uL (ref 0.0–0.2)
Abs Eosinophils: 0.1 10*3/uL (ref 0.0–0.4)
Abs Lymphocytes: 0.5 10*3/uL — ABNORMAL LOW (ref 0.7–3.1)
Abs Monocytes: 0.4 10*3/uL (ref 0.1–0.9)
Abs Neutrophils: 1.8 10*3/uL (ref 1.4–7.0)
Hematocrit: 37.4 % (ref 34.0–46.6)
Hemoglobin: 12.3 g/dL (ref 11.1–15.9)
MCH: 29.6 pg (ref 26.6–33.0)
MCHC: 32.9 g/dL (ref 31.5–35.7)
MCV: 90 fL (ref 79–97)
Platelet Count: 289 10*3/uL (ref 150–450)
RBC Count: 4.15 x10E6/uL (ref 3.77–5.28)
RDW-CV: 17.3 % — ABNORMAL HIGH (ref 11.7–15.4)
WBC Count: 2.7 10*3/uL — ABNORMAL LOW (ref 3.4–10.8)

## 2021-08-18 LAB — LACTATE DEHYDROGENASE, BLOOD: LDH, serum/plasma: 170 IU/L (ref 119–226)

## 2021-08-19 LAB — PROTEIN ELECTROPHORESIS, SERUM
A/G Ratio: 1 (ref 0.7–1.7)
Albumin, sPEP: 2.9 g/dL (ref 2.9–4.4)
Alpha 1 Globulin: 0.4 g/dL (ref 0.0–0.4)
Alpha 2 Globulin: 0.8 g/dL (ref 0.4–1.0)
Beta Globulin: 1 g/dL (ref 0.7–1.3)
Gamma Globulin: 0.6 g/dL (ref 0.4–1.8)
Globulin, Total: 2.8 g/dL (ref 2.2–3.9)
M-Protein (monoclonal), Serum: 0.5 g/dL — ABNORMAL HIGH
Protein, Total, Serum / Plasma: 5.7 g/dL — ABNORMAL LOW (ref 6.0–8.5)

## 2021-08-19 LAB — IGA, SERUM: IgA, Serum: 13 mg/dL — ABNORMAL LOW (ref 87–352)

## 2021-08-19 LAB — KAPPA AND LAMBDA FREE LIGHT CHAINS, SERUM
Kappa Light Chain, Serum, Free: 30.2 mg/L — ABNORMAL HIGH (ref 3.3–19.4)
Kappa/Lambda Ratio, Serum, Free: 12.08 — ABNORMAL HIGH (ref 0.26–1.65)
Lambda Light Chain, Serum, Free: 2.5 mg/L — ABNORMAL LOW (ref 5.7–26.3)

## 2021-08-19 LAB — IGG, SERUM: IgG: 594 mg/dL (ref 586–1602)

## 2021-08-19 LAB — IGM, SERUM: IgM, Serum: <5 mg/dL — ABNORMAL LOW (ref 26–217)

## 2021-08-25 ENCOUNTER — Ambulatory Visit: Admit: 2021-08-26 | Payer: PRIVATE HEALTH INSURANCE | Attending: Physician

## 2021-08-25 DIAGNOSIS — C9 Multiple myeloma not having achieved remission (CMS code): Secondary

## 2021-08-25 NOTE — Progress Notes (Signed)
Patient Name:  Ariel Braun    DOB:  October 12, 1961    MEDICAL RECORD NUMBER [id number]    Date of Visit: 08/25/2021     Today I saw Ariel Braun in the [facility]ology[facility], in follow up regarding myeloma. She has been referred internally by Dr. Eliberto Ivory. I have reviewed her medical records from [place] and will summarize them here for our records.    CHIEF COMPLAINT: Myeloma    SUMMARY OF HEMATOLOGIC HISTORY:  Ariel Braun is a 60 y.o. female who presents with myeloma. 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 M-spike 32, kappa 596.9 mg/L, IgG 4750, b2M 3.88. She was treated with VRd x4 (achieving VGPR), then MEL 200 ASCT 05/12/11, then observation alone. 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 She met criteria for PD on 02/19/2020 and was started on third-line DARA-Pd in June 2021.       INTERVAL HISTORY:     - Started DARA-Pd 04/2020 (pom 13m 21/28 days starting 05/14/20, dara starting 05/16/2020), which she is tolerating well. No allergic reactions or symptoms of an HAE flare. Occasional diarrhea or constipation, mild. Some mild fatigue. No recent infection.  Occasional insomnia.      - Issues currently include problems sleeping and constipation. She attributes her insomnia to anxiety about her disease, her son [name](who is intellectually disabled and has hyperparathyroidism with several kidney stones but he is getting better), and fears of COVID. Has been busy for her due to her son, [name] med issues.      - She is the sole caregiver to her husband (diabetes) and son (as above), and unfortunately her family and church have not been as supportive as she had hoped.     She has not had an abdominal issues. She was on her off week when her LFTs were elevated.     For 2 months, has watery diarrhea episodically, helped by imodium. Takes prevalite which is not helpful. Started when she got the covid vaccine and with each vax she got  diarrhea      ALLERGIES:  Allergies/Contraindications   Allergen Reactions    Caffeine Other (See Comments)     jitters       MEDICATIONS:  Current Outpatient Medications   Medication Sig Dispense Refill    acyclovir (ZOVIRAX) 400 mg tablet Take 1 tablet (400 mg total) by mouth Twice a day 180 tablet 3    amoxicillin (AMOXIL) 500 mg capsule Take 4 capsules (2,000 mg total) by mouth daily as needed (1 hour prior to dental procedures) 4 capsule 3    apixaban (ELIQUIS) 5 mg tablet Take 5 mg by mouth 2 (two) times daily      cholestyramine light (CHOLESTYRAMINE LIGHT) 4 gram packet Take 1 packet (4 g total) by mouth Twice a day 180 packet 3    dexAMETHasone (DECADRON) 4 mg tablet TAKE 2 TABLETS (8 MG TOTAL) BY MOUTH EVERY 7 DAYS. (Patient taking differently: Take 12 mg by mouth every 7 (seven) days   ) 24 tablet 3    ecallantide (KALBITOR) 10 mg/mL (1 mL) SOLN Inject into the skin once as needed.      EPINEPHrine (EPIPEN) 0.3 mg/0.3 mL (1:1,000) injection Inject 0.3 mg into the muscle once as needed. Use as instructed (Patient not taking: Reported on 10/14/2020  )      hydrOXYzine (ATARAX) 25 mg tablet Take  0.5-1 tablets (12.5-25 mg total) by mouth every 8 (eight) hours as needed for Anxiety 30 tablet 1    icatibant (FIRAZYR) 30 mg/3 mL SYRINGE Inject into the skin once as needed.      LORazepam (ATIVAN) 1 mg tablet Take 1 mg by mouth every 6 (six) hours as needed      metoclopramide HCl (REGLAN) 10 mg tablet Take 10 mg by mouth once as needed. (Patient not taking: Reported on 10/14/2020  )      multivitamin tablet Take 1 tablet by mouth Daily.       ondansetron (ZOFRAN) 8 mg tablet Take 1 tablet (8 mg total) by mouth every 8 (eight) hours as needed. (Patient not taking: Reported on 10/14/2020  ) 30 tablet 2    pomalidomide 2 mg CAP Take 2 mg by mouth daily 21/28 days        predniSONE (DELTASONE) 20 mg tablet Take 20 mg by mouth once as needed. (Patient not taking: Reported on 10/14/2020  )       No  current facility-administered medications for this visit.       PAST MEDICAL HISTORY:  Past Medical History:   Diagnosis Date    Acid reflux disease     Anxiety     Depression     Hereditary angioedema     Multiple myeloma, without mention of having achieved remission     IgG kappa    Myeloma 05/10/2011       PAST SURGICAL HISTORY:  Past Surgical History:   Procedure Laterality Date    AUTOLOGOUS STEM CELL TRANSPLANTATION  05/12/11    BREAST CYST EXCISION  1981   R breast surgery on 10/02/20 (path negative)    FAMILY HISTORY:  Family History   Problem Relation Name Age of Onset    Diabetes Mother      Hypertension Mother      Colon cancer Father      Hypertension Brother ##Brother1        SOCIAL HISTORY:  Social History     Socioeconomic History    Marital status: Married   Tobacco Use    Smoking status: Never Smoker    Smokeless tobacco: Never Used   Substance and Sexual Activity    Alcohol use: No    Drug use: No    Sexual activity: Not Currently       PHYSICAL EXAM:  PHYSICAL EXAM VIA VIDEO VISIT:  ECOG Performance Status: 1 - Symptomatic but completely ambulatory   Vital Signs: No vitals  General:  Awake, alert, pleasantly conversant female in no acute distress  HENT:  Normocephalic, Atraumatic, Bilateral external ears normal, No oral exudates, Nose normal. No stridor. No thrush  Eyes:  EOMI, Conjunctiva normal, No discharge  Respiratory:  Normal breathing, No respiratory distress  Integument:  No rash on visible skin  Neurologic:  Alert & oriented x 3  Psychiatric:  Affect normal, Judgment normal      LABORATORY DATA:  Prot Electrophoresis   Date Value Ref Range Status   08/03/2019 Abnormal (A) Normal pattern. 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 Paraprotein spike = 1.2 g/dL.  Interpretation by Pathologist: Clelia Schaumann, M.D.       Paraprotein Concentration   Date Value Ref Range Status   08/03/2019 1.2 (A) Negative g/dL Final      SPEP - External   Date Value Ref Range Status  08/04/2020 0.8  Final     Immunofixation Electrophoresis, serum   Date Value Ref Range Status   12/12/2018 IgG Kappa (A) Negative Final     Comment:     Paraprotein present, IgG Kappa  Interpretation by Pathologist: Clelia Schaumann, M.D.       Lab Results   Component Value Date    IgA 13 (L) 08/18/2021    IgA - External 12 08/04/2020    IgG, serum 1,490 08/03/2019    IgG - External 989 08/04/2020    IgM, serum 18 (L) 04/03/2019    IgM - External 14 08/04/2020    Kappa (Serum, Free) - External 70.6 08/04/2020    Kappa Lt Chain, Free 30.2 (H) 08/18/2021    Lambda (Serum, Free) - External 3.9 08/04/2020    Lambda Lt Chain, Free 2.5 (L) 08/18/2021    Kappa/Lambda Ratio 12.08 (H) 08/18/2021          PATHOLOGY:  As noted above.    RADIOLOGY:  As noted above.    ASSESSMENT &PLAN  The patient is a 60 y.o. female who presents for evaluation of myeloma.    # IgG kappa MM: Prior lines of therapy include (1) VRd --> MEL ASCT --> observation, (2) Rev --> Rd, and now (3) DARA-Pom/dex started 04/2020 for M-spike rising to 1.1. Now down to 0.8 as of 07/07/2020, although some of this may be DARA interference of course. Had brief increase in [facility] around time of breast surgery 09/2020 (when surgery was on hold), but then resumed thereafter. 02/2021 SPEP M protein and SFLC downtrending and 08/18/2021 MM labs stable   - Patient has history of allergic reactions and HAE (was monitored 1-3 hrs after first doses, got Singulair pre-med initially) but no issues so far with DARA.   - Cont DARA-Pd  DARA once a month   Continue Pom 57m daily, 21 of 28 days  Dex once weekly, 21 of 28 days - we talked about stopped the dex but the pt wants to continue for now   - Continue monthly MM labs  - next line will consider CFZ/seli/dex but will need to discuss if seli AEs would be ok with her     #Elevated LFTs: slightly increase since 05/2021  - CTM    #Diarrhea: unclear cause. Not helped with prevalite  but imodium helps  - check for C diff and O+P with local doc  - Dr. JIona Coachrx'ed lomotil which she has not used    # Anxiety, emotional distress: Causes of stress include her role as sole caregiver to her son (intellectually disabled, severe hyperparathyroidism), husband (diabetes), and herself. Also stressed about the myeloma itself and its prognosis, and also about COVID.   - follow up SMS    - Can consider onchopsychology referral down the line    # Constipation: Will start with senna & colace.    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 Possibly pom-related. No fevers or infectious symptoms. [place] 1000.    - Patient advised to monitor for infection symptoms and notify local hematologist immediately  - continue zarxio for [place]1000   - Continue to monitor    # R breast lesion: Noted on 12/2019 PET-CT, then confirmed on 04/2020 R breast [place] She got the breast biopsy 07/08/2020 showed papillary lesion, recommending excisional biopsy which returned as benign, per patient   - follow up Dr. JIona Coach(local oncologist) and Dr. KMaudie Mercury(surgeon)    #  Hereditary angioedema:   - Follows with allergist locally   - No issues with DARA-Pd    # History of PEs: Occurred while on Revlimid.   - Continue low-dose Eliquis indefinitely, particularly while on Pom    #Bone health  - if she did not finish 2 years of zometa, then would recommend finishing a total of 2 years AFTER she finishes the dental work she needs currently    #Ppx:  - Continue acyclovir   - Continue Eliquis  - recommend flu shot in Fall 2022  - s/p Athelstan #1 02/26/20, #2 03/18/20, $3 08/14/20, #4 02/02/21, #5 06/20/2021  - 11/06/20 covid ab neg  - repeat covid Ab 03/03/21 was neg   - s/p evusheld 04/01/21      #Follow-up: 09/2021 (and then see her 11/2021)      Thank you for involving me in the care of your patient. Please do not hesitate to call me anytime to discuss this patient.    Kennon Rounds, MD  Assistant Clinical  Professor  Hematology/Blood and Marrow Transplantation  [facility]        Cc:    Dr. Iona Coach (Heme)  Dr. Herbert Moors (Surgeon)          Problems: patient's active cancer represents a life-threatening illness  Risk of complications, morbidity/mortality of patient management: high; the patient's systemic cancer therapy requires regular and intensive monitoring for potential major/life-threatening toxicities  Data review/interpretation: I reviewed and/or ordered 3+ tests and/or documents    I performed this evaluation using real-time telehealth tools, including a live video Zoom connection between my location and the patient's location. Prior to initiating, the patient consented to perform this evaluation using telehealth tools.

## 2021-09-16 LAB — COMPLETE BLOOD COUNT WITH DIFFERENTIAL
% Basophils: 2 %
% Eosinophils: 2 %
% Lymphocytes: 15 %
% Monocytes: 14 %
% Neutrophils: 66 %
Abs Basophils: 0 10*3/uL (ref 0.0–0.2)
Abs Eosinophils: 0 10*3/uL (ref 0.0–0.4)
Abs Lymphocytes: 0.3 10*3/uL — ABNORMAL LOW (ref 0.7–3.1)
Abs Monocytes: 0.3 10*3/uL (ref 0.1–0.9)
Abs Neutrophils: 1.4 10*3/uL (ref 1.4–7.0)
Hematocrit: 34.2 % (ref 34.0–46.6)
Hemoglobin: 11 g/dL — ABNORMAL LOW (ref 11.1–15.9)
MCH: 28.6 pg (ref 26.6–33.0)
MCHC: 32.2 g/dL (ref 31.5–35.7)
MCV: 89 fL (ref 79–97)
Platelet Count: 280 10*3/uL (ref 150–450)
RBC Count: 3.85 x10E6/uL (ref 3.77–5.28)
RDW-CV: 18.6 % — ABNORMAL HIGH (ref 11.7–15.4)
WBC Count: 2 10*3/uL — CL (ref 3.4–10.8)

## 2021-09-16 LAB — IGM, SERUM: IgM, Serum: <5 mg/dL — ABNORMAL LOW (ref 26–217)

## 2021-09-16 LAB — PHOSPHORUS, SERUM / PLASMA: Phosphorus, serum/plasma: 2.8 mg/dL — ABNORMAL LOW (ref 3.0–4.3)

## 2021-09-16 LAB — PROTEIN ELECTROPHORESIS, SERUM
A/G Ratio: 1.3 (ref 0.7–1.7)
Albumin, sPEP: 2.8 g/dL — ABNORMAL LOW (ref 2.9–4.4)
Alpha 1 Globulin: 0.3 g/dL (ref 0.0–0.4)
Alpha 2 Globulin: 0.6 g/dL (ref 0.4–1.0)
Beta Globulin: 0.8 g/dL (ref 0.7–1.3)
Gamma Globulin: 0.4 g/dL (ref 0.4–1.8)
Globulin, Total: 2.1 g/dL — ABNORMAL LOW (ref 2.2–3.9)
M-Protein (monoclonal), Serum: 0.4 g/dL — ABNORMAL HIGH
Protein, Total, Serum / Plasma: 4.9 g/dL — ABNORMAL LOW (ref 6.0–8.5)

## 2021-09-16 LAB — KAPPA AND LAMBDA FREE LIGHT CHAINS, SERUM
Kappa Light Chain, Serum, Free: 26.9 mg/L — ABNORMAL HIGH (ref 3.3–19.4)
Kappa/Lambda Ratio, Serum, Free: 17.93 — ABNORMAL HIGH (ref 0.26–1.65)
Lambda Light Chain, Serum, Free: 1.5 mg/L — ABNORMAL LOW (ref 5.7–26.3)

## 2021-09-16 LAB — IGG, SERUM: IgG: 556 mg/dL — ABNORMAL LOW (ref 586–1602)

## 2021-09-16 LAB — COMPREHENSIVE METABOLIC PANEL (BMP, AST, ALT, T.BILI, ALKP, TP ALB)
AST: 41 IU/L — ABNORMAL HIGH (ref 0–40)
Alanine transaminase: 45 IU/L — ABNORMAL HIGH (ref 0–32)
Albumin, Serum / Plasma: 3.6 g/dL — ABNORMAL LOW (ref 3.8–4.8)
Albumin/Globulin Ratio: 2.6 — ABNORMAL HIGH (ref 1.2–2.2)
Alkaline Phosphatase: 136 IU/L — ABNORMAL HIGH (ref 44–121)
BUN/Creatinine Ratio (External Lab): 21 (ref 12–28)
Bilirubin, Total: 0.8 mg/dL (ref 0.0–1.2)
Calcium, total, Serum / Plasma: 9.1 mg/dL (ref 8.7–10.3)
Carbon Dioxide, Total: 19 mmol/L — ABNORMAL LOW (ref 20–29)
Chloride, Serum / Plasma: 110 mmol/L — ABNORMAL HIGH (ref 96–106)
Creatinine: 0.73 mg/dL (ref 0.57–1.00)
Globulin, Total: 1.4 g/dL — ABNORMAL LOW (ref 1.5–4.5)
Glucose, non-fasting: 78 mg/dL (ref 70–99)
Potassium, Serum / Plasma: 3.3 mmol/L — ABNORMAL LOW (ref 3.5–5.2)
Protein, Total, Serum / Plasma: 5 g/dL — ABNORMAL LOW (ref 6.0–8.5)
Sodium, Serum / Plasma: 142 mmol/L (ref 134–144)
Urea Nitrogen, serum/plasma: 15 mg/dL (ref 8–27)
eGFRcr: 94 mL/min/{1.73_m2} (ref >59)

## 2021-09-16 LAB — URIC ACID, SERUM / PLASMA: Uric Acid, Serum / Plasma: 3.6 mg/dL (ref 3.0–7.2)

## 2021-09-16 LAB — LACTATE DEHYDROGENASE, BLOOD: LDH, serum/plasma: 164 IU/L (ref 119–226)

## 2021-09-16 LAB — IGA, SERUM: IgA, Serum: 14 mg/dL — ABNORMAL LOW (ref 87–352)

## 2021-09-22 ENCOUNTER — Ambulatory Visit: Admit: 2021-09-25 | Payer: PRIVATE HEALTH INSURANCE | Attending: Nurse Practitioner

## 2021-09-22 DIAGNOSIS — C9000 Multiple myeloma not having achieved remission: Secondary | ICD-10-CM

## 2021-09-22 NOTE — Progress Notes (Signed)
This is an independent service.  The available consultant for this service is [facility] Carolynn Serve, MD.           Patient Name:  Ariel Braun    DOB:  13-Sep-1961    MEDICAL RECORD NUMBER [id number]    Date of Visit: 09/22/2021     Today I saw CHENEE MUNNS in the [facility], in follow up regarding myeloma. She has been referred internally by Dr. Eliberto Ivory. I have reviewed her medical records from [place] and will summarize them here for our records.    CHIEF COMPLAINT: Myeloma    SUMMARY OF HEMATOLOGIC HISTORY:  JAZALYNN MIRELES is a 60 y.o. female who presents with myeloma. 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 M-spike 32, kappa 596.9 mg/L, IgG 4750, b2M 3.88. She was treated with VRd x4 (achieving VGPR), then MEL 200 ASCT 05/12/11, then observation alone. 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 She met criteria for PD on 02/19/2020 and was started on third-line DARA-Pd in June 2021.       INTERVAL HISTORY:     - Started DARA-Pd 04/2020 (pom 71m 21/28 days starting 05/14/20, dara starting 05/16/2020), which she is tolerating well. No allergic reactions or symptoms of an HAE flare. Occasional diarrhea or constipation, mild. Some mild fatigue. No recent infection.  Occasional insomnia.      - Issues currently include problems sleeping and constipation. She attributes her insomnia to anxiety about her disease, her son [name](who is intellectually disabled and has hyperparathyroidism with several kidney stones but he is getting better), and fears of COVID. Has been busy for her due to her son, [name] med issues.      - She is the sole caregiver to her husband (diabetes) and son (as above), and unfortunately her family and church have not been as supportive as she had hoped.     Since 06/20/21, after getting covid vaccine, has been having  watery diarrhea episodically, helped by imodium. Takes prevalite which hasn't helped. Started when  she started her covid vaccines and with each vax she would have diarrhea, but usually stops after a few days.  With her last vaccine in August, the diarrhea persists. Stool slightly formed while on dex, but returns to watery after a few days. Prior to august, no diarrhea with Pom.     Stressed with son's brain tumor diagnosis, her focus is on him currently.    ALLERGIES:  Allergies/Contraindications   Allergen Reactions    Caffeine Other (See Comments)     jitters       MEDICATIONS:  Current Outpatient Medications   Medication Sig Dispense Refill    acyclovir (ZOVIRAX) 400 mg tablet Take 1 tablet (400 mg total) by mouth Twice a day 180 tablet 3    amoxicillin (AMOXIL) 500 mg capsule Take 4 capsules (2,000 mg total) by mouth daily as needed (1 hour prior to dental procedures) 4 capsule 3    apixaban (ELIQUIS) 5 mg tablet Take 5 mg by mouth 2 (two) times daily      cholestyramine light (CHOLESTYRAMINE LIGHT) 4 gram packet Take 1 packet (4 g total) by mouth Twice a day 180 packet 3    dexAMETHasone (DECADRON) 4 mg tablet TAKE 2 TABLETS (8 MG TOTAL) BY MOUTH EVERY 7 DAYS. (Patient taking differently: Take 12 mg by mouth every 7 (seven) days   )  24 tablet 3    ecallantide (KALBITOR) 10 mg/mL (1 mL) SOLN Inject into the skin once as needed.      EPINEPHrine (EPIPEN) 0.3 mg/0.3 mL (1:1,000) injection Inject 0.3 mg into the muscle once as needed. Use as instructed (Patient not taking: Reported on 10/14/2020  )      hydrOXYzine (ATARAX) 25 mg tablet Take 0.5-1 tablets (12.5-25 mg total) by mouth every 8 (eight) hours as needed for Anxiety 30 tablet 1    icatibant (FIRAZYR) 30 mg/3 mL SYRINGE Inject into the skin once as needed.      LORazepam (ATIVAN) 1 mg tablet Take 1 mg by mouth every 6 (six) hours as needed      metoclopramide HCl (REGLAN) 10 mg tablet Take 10 mg by mouth once as needed. (Patient not taking: Reported on 10/14/2020  )      multivitamin tablet Take 1 tablet by mouth Daily.       ondansetron  (ZOFRAN) 8 mg tablet Take 1 tablet (8 mg total) by mouth every 8 (eight) hours as needed. (Patient not taking: Reported on 10/14/2020  ) 30 tablet 2    pomalidomide 2 mg CAP Take 2 mg by mouth daily 21/28 days        predniSONE (DELTASONE) 20 mg tablet Take 20 mg by mouth once as needed. (Patient not taking: Reported on 10/14/2020  )       No current facility-administered medications for this visit.       PAST MEDICAL HISTORY:  Past Medical History:   Diagnosis Date    Acid reflux disease     Anxiety     Depression     Hereditary angioedema     Multiple myeloma, without mention of having achieved remission     IgG kappa    Myeloma 05/10/2011       PAST SURGICAL HISTORY:  Past Surgical History:   Procedure Laterality Date    AUTOLOGOUS STEM CELL TRANSPLANTATION  05/12/11    BREAST CYST EXCISION  1981   R breast surgery on 10/02/20 (path negative)    FAMILY HISTORY:  Family History   Problem Relation Name Age of Onset    Diabetes Mother      Hypertension Mother      Colon cancer Father      Hypertension Brother ##Brother1        SOCIAL HISTORY:  Social History     Socioeconomic History    Marital status: Married   Tobacco Use    Smoking status: Never Smoker    Smokeless tobacco: Never Used   Substance and Sexual Activity    Alcohol use: No    Drug use: No    Sexual activity: Not Currently       PHYSICAL EXAM:  PHYSICAL EXAM VIA VIDEO VISIT:  ECOG Performance Status: 1 - Symptomatic but completely ambulatory   Vital Signs: No vitals  General:  Awake, alert, pleasantly conversant female in no acute distress  HENT:  Normocephalic, Atraumatic, Bilateral external ears normal, Nose normal.  Eyes:  EOMI, Conjunctiva normal, No discharge  Respiratory:  Normal breathing, No respiratory distress  Integument:  No rash on visible skin  Neurologic:  Alert & oriented x 3  Psychiatric:  Affect normal, Judgment normal.        LABORATORY DATA:  Prot Electrophoresis   Date Value Ref Range Status   08/03/2019 Abnormal  (A) Normal pattern. Final     Comment:     Protein spike in  the gamma region, consistent with a monoclonal gammopathy. Paraprotein spike = 1.2 g/dL.  Interpretation by Pathologist: Clelia Schaumann, M.D.       Paraprotein Concentration   Date Value Ref Range Status   08/03/2019 1.2 (A) Negative g/dL Final     SPEP - External   Date Value Ref Range Status   08/04/2020 0.8  Final     Immunofixation Electrophoresis, serum   Date Value Ref Range Status   12/12/2018 IgG Kappa (A) Negative Final     Comment:     Paraprotein present, IgG Kappa  Interpretation by Pathologist: Clelia Schaumann, M.D.       Lab Results   Component Value Date    IgA 14 (L) 09/15/2021    IgA - External 12 08/04/2020    IgG, serum 1,490 08/03/2019    IgG - External 989 08/04/2020    IgM, serum 18 (L) 04/03/2019    IgM - External 14 08/04/2020    Kappa (Serum, Free) - External 70.6 08/04/2020    Kappa Lt Chain, Free 26.9 (H) 09/15/2021    Lambda (Serum, Free) - External 3.9 08/04/2020    Lambda Lt Chain, Free 1.5 (L) 09/15/2021    Kappa/Lambda Ratio 17.93 (H) 09/15/2021          PATHOLOGY:  As noted above.    RADIOLOGY:  As noted above.    ASSESSMENT &PLAN  The patient is a 60 y.o. female who presents for evaluation of myeloma.    # IgG kappa MM: Prior lines of therapy include (1) VRd --> MEL ASCT --> observation, (2) Rev --> Rd, and now (3) DARA-Pom/dex started 04/2020 for M-spike rising to 1.1. Now down to 0.8 as of 07/07/2020, although some of this may be DARA interference of course. Had brief increase in [facility] around time of breast surgery 09/2020 (when surgery was on hold), but then resumed thereafter. 02/2021 SPEP M protein and SFLC downtrending and 08/18/2021 MM labs stable   - Patient has history of allergic reactions and HAE (was monitored 1-3 hrs after first doses, got Singulair pre-med initially) but no issues so far with DARA.   - Cont DARA-Pd  DARA once a month   Continue Pom 12m daily, 21 of 28 days  Dex once weekly, 21 of 28 days - we  talked about stopped the dex but the pt wants to continue for now as it helps with the diarrhea  - 09/15/21 local labs reviewed with patien and stable   - Continue monthly MM labs  - next line will consider CFZ/seli/dex but will need to discuss if seli AEs would be ok with her     #Elevated LFTs: slightly increase since 05/2021  - CTM  - consider liver ultrasound    #Diarrhea: unclear cause, but patient first noted it after covid vaccinations. Not helped with prevalite but imodium helps. 08/27/21 CDiff and O+P negative.  - check for C diff and O+P with local doc  - Dr. JIona Coachrx'ed lomotil which she has not used  - will repeat stool testing and add full viral/bacterial panel  - will discuss GI referral and referral for colonoscopy with Dr. JIona Coach(although patient wants to hold off for now due to being occupied by son's health issues)    # Anxiety, emotional distress: Causes of stress include her role as sole caregiver to her son (intellectually disabled, severe hyperparathyroidism), husband (diabetes), and herself. Also stressed about the myeloma itself and its prognosis, and also about COVID.   -  follow up SMS    - Can consider oncopsychology referral down the line    # Constipation: resolved.  Currently with diarrhea.    # Gr3 neutropenia: On 11/06/20 labs ([place] 600) isolated. Possibly pom-related. No fevers or infectious symptoms. [place] is typically borderline, around 1000.    - Patient advised to monitor for infection symptoms and notify local hematologist immediately  - continue zarxio for [place] <1000   - Continue to monitor    # R breast lesion: Noted on 12/2019 PET-CT, then confirmed on 04/2020 R breast US. She got the breast biopsy 07/08/2020 showed papillary lesion, recommending excisional biopsy which returned as benign, per patient   - follow up Dr. Iona Coach (local oncologist) and Dr. Maudie Mercury (surgeon)    # Hereditary angioedema:   - Follows with allergist locally   - No issues with DARA-Pd    # History of PEs: Occurred while  on Revlimid.   - Continue low-dose Eliquis indefinitely, particularly while on Pom    #Bone health  - if she did not finish 2 years of zometa, then would recommend finishing a total of 2 years AFTER she finishes the dental work she needs currently    [facility]  - consider colonoscopy when ready    #Ppx:  - Continue acyclovir   - Continue Eliquis  - s/p [place] #1 02/26/20, #2 03/18/20, $3 08/14/20, #4 02/02/21, #5 06/20/2021, bivalent booster due now  - flu vaccine recommended  - 11/06/20 covid ab neg  - repeat covid Ab 03/03/21 was neg   - s/p evusheld 04/01/21, recommend repeat dosing now    #Follow-up:  Monthly visits with Local Doc; 3 months with Dr. Jacelyn Grip      Thank you for involving me in the care of your patient. Please do not hesitate to call me anytime to discuss this patient.    Kathrynn Running NP        Cc:    Dr. Iona Coach (Heme)  Dr. Herbert Moors (Surgeon)          This patient has a high complexity problem: multiple myeloma, which is an incurable malignancy and poses a threat to life and bodily function.        Regarding data complexity, I have reviewed the following tests: CBC, CMP, SPEP, light chains     Medical decision making: high risk.  The patient will continue with Dara/Pom/Dex therapy for multiple myeloma.  The patient will continue on this therapy until progression or intolerance and therefore must  be closely monitored for toxicities, which may include low blood counts, infections, clots, secondary malignancies, GI toxicities, peripheal neuropathy, fatigue.  The patient was extensively counseled on this and agrees to continue with the treatment plan.     Communicated with local hematologist.     I performed this evaluation using real-time telehealth tools, including a live video Zoom connection between my location and the patient's location. Prior to initiating, the patient consented to perform this evaluation using telehealth tools.

## 2021-09-22 NOTE — Patient Instructions (Addendum)
I will discuss with Dr. Iona Coach to repeat stool testing and add viral and bacterial panels    Consider gastroenterology referral and colonoscopy when you're ready    Continue Imodium as needed for diarrhea     Continue Dara/Pom/Dex for now.  Your myeloma is responding and stable.    Continue twice daily eliquis and acyclovir    Follow up in 3 months

## 2021-10-14 LAB — COMPREHENSIVE METABOLIC PANEL (BMP, AST, ALT, T.BILI, ALKP, TP ALB)
AST: 49 IU/L — ABNORMAL HIGH (ref 0–40)
Alanine transaminase: 33 IU/L — ABNORMAL HIGH (ref 0–32)
Albumin, Serum / Plasma: 3.2 g/dL — ABNORMAL LOW (ref 3.8–4.8)
Albumin/Globulin Ratio: 2 (ref 1.2–2.2)
Alkaline Phosphatase: 278 IU/L — ABNORMAL HIGH (ref 44–121)
BUN/Creatinine Ratio (External Lab): 22 (ref 12–28)
Bilirubin, Total: 1.5 mg/dL — ABNORMAL HIGH (ref 0.0–1.2)
Calcium, total, Serum / Plasma: 8.2 mg/dL — ABNORMAL LOW (ref 8.7–10.3)
Carbon Dioxide, Total: 25 mmol/L (ref 20–29)
Chloride, Serum / Plasma: 101 mmol/L (ref 96–106)
Creatinine: 0.83 mg/dL (ref 0.57–1.00)
Globulin, Total: 1.6 g/dL (ref 1.5–4.5)
Glucose, non-fasting: 120 mg/dL — ABNORMAL HIGH (ref 70–99)
Potassium, Serum / Plasma: 2.6 mmol/L — ABNORMAL LOW (ref 3.5–5.2)
Protein, Total, Serum / Plasma: 4.8 g/dL — ABNORMAL LOW (ref 6.0–8.5)
Sodium, Serum / Plasma: 138 mmol/L (ref 134–144)
Urea Nitrogen, Serum / Plasma: 18 mg/dL (ref 8–27)
eGFRcr: 80 mL/min/{1.73_m2} (ref >59)

## 2021-10-14 LAB — COMPLETE BLOOD COUNT WITH DIFFERENTIAL
% Basophils: 2 %
% Eosinophils: 8 %
% Lymphocytes: 23 %
% Monocytes: 12 %
% Neutrophils: 53 %
Abs Basophils: 0 10*3/uL (ref 0.0–0.2)
Abs Eosinophils: 0.2 10*3/uL (ref 0.0–0.4)
Abs Lymphocytes: 0.4 10*3/uL — ABNORMAL LOW (ref 0.7–3.1)
Abs Monocytes: 0.2 10*3/uL (ref 0.1–0.9)
Abs Neutrophils: 1 10*3/uL — ABNORMAL LOW (ref 1.4–7.0)
Hematocrit: 39.3 % (ref 34.0–46.6)
Hemoglobin: 12.8 g/dL (ref 11.1–15.9)
MCH: 28.6 pg (ref 26.6–33.0)
MCHC: 32.6 g/dL (ref 31.5–35.7)
MCV: 88 fL (ref 79–97)
Platelet Count: 271 10*3/uL (ref 150–450)
RBC Count: 4.47 x10E6/uL (ref 3.77–5.28)
RDW-CV: 18.8 % — ABNORMAL HIGH (ref 11.7–15.4)
WBC Count: 1.9 10*3/uL — CL (ref 3.4–10.8)

## 2021-10-14 LAB — LACTATE DEHYDROGENASE, BLOOD: LDH, serum/plasma: 243 IU/L — ABNORMAL HIGH (ref 119–226)

## 2021-10-15 LAB — PROTEIN ELECTROPHORESIS, SERUM
A/G Ratio: 1 (ref 0.7–1.7)
Albumin, sPEP: 2.4 g/dL — ABNORMAL LOW (ref 2.9–4.4)
Alpha 1 Globulin: 0.4 g/dL (ref 0.0–0.4)
Alpha 2 Globulin: 0.7 g/dL (ref 0.4–1.0)
Beta Globulin: 0.7 g/dL (ref 0.7–1.3)
Gamma Globulin: 0.5 g/dL (ref 0.4–1.8)
Globulin, Total: 2.3 g/dL (ref 2.2–3.9)
M-Protein (monoclonal), Serum: 0.4 g/dL — ABNORMAL HIGH
Protein, Total, Serum / Plasma: 4.7 g/dL — ABNORMAL LOW (ref 6.0–8.5)

## 2021-10-15 LAB — IGG, SERUM: IgG: 585 mg/dL — ABNORMAL LOW (ref 586–1602)

## 2021-10-15 LAB — IGA, SERUM: IgA, Serum: 20 mg/dL — ABNORMAL LOW (ref 87–352)

## 2021-10-15 LAB — KAPPA AND LAMBDA FREE LIGHT CHAINS, SERUM
Kappa Light Chain, Serum, Free: 32.2 mg/L — ABNORMAL HIGH (ref 3.3–19.4)
Kappa/Lambda Ratio, Serum, Free: 14.64 — ABNORMAL HIGH (ref 0.26–1.65)
Lambda Light Chain, Serum, Free: 2.2 mg/L — ABNORMAL LOW (ref 5.7–26.3)

## 2021-10-15 LAB — IGM, SERUM: IgM, Serum: 7 mg/dL — ABNORMAL LOW (ref 26–217)

## 2021-11-03 ENCOUNTER — Ambulatory Visit: Admit: 2021-11-12 | Discharge: 2021-11-12 | Payer: PRIVATE HEALTH INSURANCE | Attending: Nurse Practitioner

## 2021-11-03 DIAGNOSIS — C9000 Multiple myeloma not having achieved remission: Secondary | ICD-10-CM

## 2021-11-03 NOTE — Patient Instructions (Addendum)
Cholestyramine powder daily as needed for diarrhea    Continue imodium as needed    Follow up with GI regarding norovirus    Again advised to HOLD treatment for 2-4 weeks to allow for infection to clear.

## 2021-11-03 NOTE — Progress Notes (Signed)
This is an independent service.  The available consultant for this service is [facility] Carolynn Serve, MD.           Patient Name:  Ariel Braun    DOB:  March 12, 1961    MEDICAL RECORD NUMBER [id number]    Date of Visit: 11/03/2021     Today I saw Ariel Braun in the [facility], in follow up regarding myeloma. She has been referred internally by Dr. Eliberto Ivory. I have reviewed her medical records from [place] and will summarize them here for our records.    CHIEF COMPLAINT: Myeloma    SUMMARY OF HEMATOLOGIC HISTORY:  Ariel Braun is a 60 y.o. female who presents with myeloma. 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 M-spike 32, kappa 596.9 mg/L, IgG 4750, b2M 3.88. She was treated with VRd x4 (achieving VGPR), then MEL 200 ASCT 05/12/11, then observation alone. 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 She met criteria for PD on 02/19/2020 and was started on third-line DARA-Pd in June 2021.       INTERVAL HISTORY:     - Started DARA-Pd 04/2020 (pom 73m 21/28 days starting 05/14/20, dara starting 05/16/2020), which she is tolerating well. No allergic reactions or symptoms of an HAE flare.      - Issues currently include problems sleeping and constipation. She attributes her insomnia to anxiety about her disease, her son [name](who is intellectually disabled and has hyperparathyroidism with several kidney stones but he is getting better), and fears of COVID. Has been busy for her due to her son, [name] med issues.      - She is the sole caregiver to her husband (diabetes) and son (as above), and unfortunately her family and church have not been as supportive as she had hoped.     Since 06/20/21, after getting covid vaccine, has been having  watery diarrhea episodically, helped by imodium. Takes prevalite which hasn't helped. Started when she started her covid vaccines and with each vax she would have diarrhea, but usually stops after a few  days.  With her last vaccine in August, the diarrhea persists. Stool slightly formed while on dex, but returns to watery after a few days. Prior to august, no diarrhea with Pom. 10/04/21 stool studies + Norovirus.  Continues to have diarrhea daily.  Feels very weak.    Stressed with son's brain tumor diagnosis, her focus is on him currently.    Continues to have diarrhea, saw GI.  Continues to take liquid imodium about twice a day. Appetite low. Denies fever. Denies abdominal cramping. Occasional stomach pain. Occasional nausea. Did not take Pom yesterday, next Dara at end of the month.     ALLERGIES:  Allergies/Contraindications   Allergen Reactions    Caffeine Other (See Comments)     jitters       MEDICATIONS:  Current Outpatient Medications   Medication Sig Dispense Refill    acyclovir (ZOVIRAX) 400 mg tablet Take 1 tablet (400 mg total) by mouth Twice a day 180 tablet 3    amoxicillin (AMOXIL) 500 mg capsule Take 4 capsules (2,000 mg total) by mouth daily as needed (1 hour prior to dental procedures) (Patient not taking: Reported on 09/22/2021) 4 capsule 3    apixaban (ELIQUIS) 5 mg tablet Take 5 mg by mouth 2 (two) times daily      cholestyramine light (CHOLESTYRAMINE LIGHT)  4 gram packet Take 1 packet (4 g total) by mouth Twice a day (Patient not taking: Reported on 09/22/2021) 180 packet 3    dexAMETHasone (DECADRON) 4 mg tablet TAKE 2 TABLETS (8 MG TOTAL) BY MOUTH EVERY 7 DAYS. (Patient taking differently: Take 12 mg by mouth every 7 (seven) days) 24 tablet 3    ecallantide (KALBITOR) 10 mg/mL (1 mL) SOLN Inject into the skin once as needed.      EPINEPHrine (EPIPEN) 0.3 mg/0.3 mL (1:1,000) injection Inject 0.3 mg into the muscle once as needed. Use as instructed (Patient not taking: Reported on 10/14/2020  )      hydrOXYzine (ATARAX) 25 mg tablet Take 0.5-1 tablets (12.5-25 mg total) by mouth every 8 (eight) hours as needed for Anxiety 30 tablet 1    icatibant (FIRAZYR) 30 mg/3 mL SYRINGE Inject  into the skin once as needed.      LORazepam (ATIVAN) 1 mg tablet Take 1 mg by mouth every 6 (six) hours as needed      metoclopramide HCl (REGLAN) 10 mg tablet Take 10 mg by mouth once as needed. (Patient not taking: No sig reported)      multivitamin tablet Take 1 tablet by mouth Daily.  (Patient not taking: Reported on 09/22/2021)      ondansetron (ZOFRAN) 8 mg tablet Take 1 tablet (8 mg total) by mouth every 8 (eight) hours as needed. (Patient not taking: No sig reported) 30 tablet 2    pomalidomide 2 mg CAP Take 2 mg by mouth daily 21/28 days        predniSONE (DELTASONE) 20 mg tablet Take 20 mg by mouth once as needed. (Patient not taking: Reported on 10/14/2020  )       No current facility-administered medications for this visit.       PAST MEDICAL HISTORY:  Past Medical History:   Diagnosis Date    Acid reflux disease     Anxiety     Depression     Hereditary angioedema     Multiple myeloma, without mention of having achieved remission     IgG kappa    Myeloma 05/10/2011       PAST SURGICAL HISTORY:  Past Surgical History:   Procedure Laterality Date    AUTOLOGOUS STEM CELL TRANSPLANTATION  05/12/11    BREAST CYST EXCISION  1981   R breast surgery on 10/02/20 (path negative)    FAMILY HISTORY:  Family History   Problem Relation Name Age of Onset    Diabetes Mother      Hypertension Mother      Colon cancer Father      Hypertension Brother ##Brother1        SOCIAL HISTORY:  Social History     Socioeconomic History    Marital status: Married   Tobacco Use    Smoking status: Never    Smokeless tobacco: Never   Substance and Sexual Activity    Alcohol use: No    Drug use: No    Sexual activity: Not Currently       PHYSICAL EXAM:  PHYSICAL EXAM VIA VIDEO VISIT:  ECOG Performance Status: 1 - Symptomatic but completely ambulatory   Vital Signs: No vitals  General:  Awake, alert, pleasantly conversant female in no acute distress  HENT:  Normocephalic, Atraumatic, Bilateral external ears normal,  Nose normal.  Eyes:  EOMI, Conjunctiva normal, No discharge  Respiratory:  Normal breathing, No respiratory distress  Integument:  No rash on visible skin  Neurologic:  Alert & oriented x 3  Psychiatric:  Affect normal, Judgment normal.        LABORATORY DATA:  Prot Electrophoresis   Date Value Ref Range Status   08/03/2019 Abnormal (A) Normal pattern. 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 Paraprotein spike = 1.2 g/dL.  Interpretation by Pathologist: Clelia Schaumann, M.D.       Paraprotein Concentration   Date Value Ref Range Status   08/03/2019 1.2 (A) Negative g/dL Final     SPEP - External   Date Value Ref Range Status   08/04/2020 0.8  Final     Immunofixation Electrophoresis, serum   Date Value Ref Range Status   12/12/2018 IgG Kappa (A) Negative Final     Comment:     Paraprotein present, IgG Kappa  Interpretation by Pathologist: Clelia Schaumann, M.D.       Lab Results   Component Value Date    IgA 20 (L) 10/13/2021    IgA - External 12 08/04/2020    IgG, serum 1,490 08/03/2019    IgG - External 989 08/04/2020    IgM, serum 18 (L) 04/03/2019    IgM - External 14 08/04/2020    Kappa (Serum, Free) - External 70.6 08/04/2020    Kappa Lt Chain, Free 32.2 (H) 10/13/2021    Lambda (Serum, Free) - External 3.9 08/04/2020    Lambda Lt Chain, Free 2.2 (L) 10/13/2021    Kappa/Lambda Ratio 14.64 (H) 10/13/2021          PATHOLOGY:  As noted above.    RADIOLOGY:  As noted above.    ASSESSMENT &PLAN  The patient is a 60 y.o. female who presents for evaluation of myeloma.    # IgG kappa MM: Prior lines of therapy include (1) VRd --> MEL ASCT --> observation, (2) Rev --> Rd, and now (3) DARA-Pom/dex started 04/2020 for M-spike rising to 1.1. Now down to 0.8 as of 07/07/2020, although some of this may be DARA interference of course. Had brief increase in [facility] around time of breast surgery 09/2020 (when surgery was on hold), but then resumed thereafter. 02/2021 SPEP  M protein and SFLC downtrending and 08/18/2021 MM labs stable   - Patient has history of allergic reactions and HAE (was monitored 1-3 hrs after first doses, got Singulair pre-med initially) but no issues so far with DARA.   - HOLD DARA-Pd for 2-4 weeks due to Norovirus and persistent diarrhea  DARA once a month   Pom 6m daily, 21 of 28 days  Dex once weekly, 21 of 28 days - we talked about stopping the dex but the pt wants to continue for now as it helps with the diarrhea  - reviewed 10/13/21 local labs, stable   - Continue monthly MM labs  - next line will consider CFZ/seli/dex but will need to discuss if seli AEs would be ok with her     #Elevated LFTs: slightly increase since 05/2021  - CTM  - consider liver ultrasound    #Diarrhea: unclear cause, but patient first noted it after covid vaccinations. Not helped with prevalite but imodium helps. 08/27/21 CDiff and O+P negative, but + for norovirus.  - supportive care for norovirus  - recommend holding myeloma therapy for 2 weeks   - Dr. JIona Coachrx'ed lomotil which she has not used  - will discuss GI referral and referral for colonoscopy with Dr. JIona Coach(  although patient wants to hold off for now due to being occupied by son's health issues)    # Anxiety, emotional distress: Causes of stress include her role as sole caregiver to her son (intellectually disabled, severe hyperparathyroidism), husband (diabetes), and herself. Also stressed about the myeloma itself and its prognosis, and also about COVID.   - follow up SMS    - Can consider oncopsychology referral down the line    # Constipation: resolved.  Currently with diarrhea.    # Gr3 neutropenia: On 11/06/20 labs ([place] 600) isolated. Possibly pom-related. No fevers or infectious symptoms. [place] is typically borderline, around 1000.    - Patient advised to monitor for infection symptoms and notify local hematologist immediately  - continue zarxio for [place] <1000   - Continue to monitor    # R breast lesion: Noted on 12/2019  PET-CT, then confirmed on 04/2020 R breast US. She got the breast biopsy 07/08/2020 showed papillary lesion, recommending excisional biopsy which returned as benign, per patient   - follow up Dr. Iona Coach (local oncologist) and Dr. Maudie Mercury (surgeon)    # Hereditary angioedema:   - Follows with allergist locally   - No issues with DARA-Pd    # History of PEs: Occurred while on Revlimid.   - Continue low-dose Eliquis indefinitely, particularly while on Pom    #Bone health  - if she did not finish 2 years of zometa, then would recommend finishing a total of 2 years AFTER she finishes the dental work she needs currently    [facility]  - consider colonoscopy when ready    #Ppx:  - Continue acyclovir   - Continue Eliquis  - s/p Bennett #1 02/26/20, #2 03/18/20, $3 08/14/20, #4 02/02/21, #5 06/20/2021, bivalent booster due now  - flu vaccine recommended  - 11/06/20 covid ab neg  - repeat covid Ab 03/03/21 was neg   - s/p evusheld 04/01/21, recommend repeat dosing now    #Follow-up:  Monthly visits with Local Doc; 2 months with Dr. Jacelyn Grip      Thank you for involving me in the care of your patient. Please do not hesitate to call me anytime to discuss this patient.    Kathrynn Running NP        Cc:    Dr. Iona Coach (Heme)  Dr. Herbert Moors (Surgeon)          This patient has a high complexity problem: multiple myeloma, which is an incurable malignancy and poses a threat to life and bodily function.        Regarding data complexity, I have reviewed the following tests: CBC, CMP, SPEP, light chains     Medical decision making: high risk.  Advised holding Dara/Pom/Dex for 2-4 weeks for multiple myeloma due to active infection.  The patient will continue on this therapy until progression or intolerance and therefore must  be closely monitored for toxicities, which may include low blood counts, infections, clots, secondary malignancies, GI toxicities, peripheal neuropathy, fatigue.  The patient was extensively counseled on this and agrees to continue with the  treatment plan.     Communicated with local hematologist.     I performed this evaluation using real-time telehealth tools, including a live video Zoom connection between my location and the patient's location. Prior to initiating, the patient consented to perform this evaluation using telehealth tools.

## 2021-11-17 MED ORDER — DICYCLOMINE 20 MG TABLET
20 | ORAL_TABLET | Freq: Three times a day (TID) | ORAL | 0 refills | Status: AC | PRN
Start: 2021-11-17 — End: 2021-11-27

## 2021-12-02 ENCOUNTER — Ambulatory Visit: Admit: 2021-12-02 | Payer: PRIVATE HEALTH INSURANCE | Attending: Nurse Practitioner

## 2021-12-02 DIAGNOSIS — C9000 Multiple myeloma not having achieved remission: Secondary | ICD-10-CM

## 2021-12-02 MED ORDER — DICYCLOMINE 10 MG CAPSULE: 10 mg | ORAL | Status: AC | PRN

## 2021-12-02 NOTE — Progress Notes (Signed)
This is an independent service.  The available consultant for this service is [facility] Ari[facility] Serve, MD.           Patient Name:  Ariel Braun    DOB:  1960-12-06    MEDICAL RECORD NUMBER [id number]    Date of Visit: 12/02/2021     Today I saw Ariel Braun in the [facility]ematology[facility]BMT [facility], in follow up regarding myeloma. She has been referred internally by Dr. Eliberto Braun. I have reviewed her medical records from [place] and will summarize them here for our records.    CHIEF COMPLAINT: Myeloma    SUMMARY OF HEMATOLOGIC HISTORY:  Ariel Braun is a 61 y.o. female who presents with myeloma. 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 M-spike 32, kappa 596.9 mg/L, IgG 4750, b2M 3.88. She was treated with VRd x4 (achieving VGPR), then MEL 200 ASCT 05/12/11, then observation alone. 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 She met criteria for PD on 02/19/2020 and was started on third-line DARA-Pd in June 2021.       INTERVAL HISTORY:     - Started DARA-Pd 04/2020 (pom 65m 21/28 days starting 05/14/20, dara starting 05/16/2020), which she is tolerating well. No allergic reactions or symptoms of an HAE flare.      - Issues currently include problems sleeping and constipation. She attributes her insomnia to anxiety about her disease, her son Ariel Braun(who is intellectually disabled and has hyperparathyroidism with several kidney stones but he is getting better), and fears of COVID. Has been busy for her due to her son, Ariel Braun's med issues.      - She is the sole caregiver to her husband (diabetes) and son (as above), and unfortunately her family and church have not been as supportive as she had hoped.     Since 06/20/21, after getting covid vaccine, has been having  watery diarrhea episodically, helped by imodium. Takes prevalite which hasn't helped. Started when she started her covid vaccines and with each vax she would have diarrhea, but usually stops after a few  days.  With her last vaccine in August, the diarrhea persists. Stool slightly formed while on dex, but returns to watery after a few days. Prior to august, no diarrhea with Pom. 10/04/21 stool studies + Norovirus.  Continues to have diarrhea daily.  Feels very weak.    Stressed with son's brain tumor diagnosis, her focus is on him currently.    On VV with daughter Ariel Braun lower, in bed most of the time.  Cannot even sit up in bed to eat.  Unable to eat solids.  Only eating liquids. Unable to stand, legs weak.  Feet are swollen. Less watery stools, less freq. Abd cramping helped with Bentyl.    Sx seem to be much worse in the past week.  Confused, struggles to complete sentences. No fever, infection symptoms.     Has not seen any providers in person recently. She last had dara 1st week of Dec.     Denies bone pain, denies leg numbness. No incontinence but requires bed pan for urination and bms as she is unable to get to the bathroom.     Daughter amenable to figuring [facility], otherwise will call 911.        ALLERGIES:  Allergies/Contraindications   Allergen Reactions  Caffeine Other (See Comments)     jitters       MEDICATIONS:  Current Outpatient Medications   Medication Sig Dispense Refill    acyclovir (ZOVIRAX) 400 mg tablet Take 1 tablet (400 mg total) by mouth Twice a day 180 tablet 3    amoxicillin (AMOXIL) 500 mg capsule Take 4 capsules (2,000 mg total) by mouth daily as needed (1 hour prior to dental procedures) (Patient not taking: Reported on 09/22/2021) 4 capsule 3    apixaban (ELIQUIS) 5 mg tablet Take 5 mg by mouth 2 (two) times daily      cholestyramine light (CHOLESTYRAMINE LIGHT) 4 gram packet Take 1 packet (4 g total) by mouth Twice a day (Patient not taking: Reported on 09/22/2021) 180 packet 3    dexAMETHasone (DECADRON) 4 mg tablet TAKE 2 TABLETS (8 MG TOTAL) BY MOUTH EVERY 7 DAYS. (Patient not taking: Reported on 11/03/2021) 24 tablet 3     ecallantide 10 mg/mL (1 mL) SOLN Inject into the skin once as needed. (Patient not taking: Reported on 11/03/2021)      EPINEPHrine (EPIPEN) 0.3 mg/0.3 mL (1:1,000) injection Inject 0.3 mg into the muscle once as needed. Use as instructed (Patient not taking: Reported on 10/14/2020)      hydrOXYzine (ATARAX) 25 mg tablet Take 0.5-1 tablets (12.5-25 mg total) by mouth every 8 (eight) hours as needed for Anxiety (Patient not taking: Reported on 11/03/2021) 30 tablet 1    icatibant (FIRAZYR) 30 mg/3 mL injection Inject into the skin once as needed. (Patient not taking: Reported on 11/03/2021)      LORazepam (ATIVAN) 1 mg tablet Take 1 mg by mouth every 6 (six) hours as needed (Patient not taking: Reported on 11/03/2021)      metoclopramide HCl (REGLAN) 10 mg tablet Take 10 mg by mouth once as needed. (Patient not taking: Reported on 10/14/2020)      multivitamin tablet Take 1 tablet by mouth Daily.  (Patient not taking: Reported on 09/22/2021)      ondansetron (ZOFRAN) 8 mg tablet Take 1 tablet (8 mg total) by mouth every 8 (eight) hours as needed. (Patient not taking: Reported on 10/14/2020) 30 tablet 2    pomalidomide 2 mg CAP Take 2 mg by mouth daily 21/28 days   (Patient not taking: Reported on 11/03/2021)      predniSONE (DELTASONE) 20 mg tablet Take 20 mg by mouth once as needed. (Patient not taking: Reported on 10/14/2020)       No current facility-administered medications for this visit.       PAST MEDICAL HISTORY:  Past Medical History:   Diagnosis Date    Acid reflux disease     Anxiety     Depression     Hereditary angioedema     Multiple myeloma, without mention of having achieved remission     IgG kappa    Myeloma 05/10/2011       PAST SURGICAL HISTORY:  Past Surgical History:   Procedure Laterality Date    AUTOLOGOUS STEM CELL TRANSPLANTATION  05/12/11    BREAST CYST EXCISION  1981   R breast surgery on 10/02/20 (path negative)    FAMILY HISTORY:  Family History   Problem Relation Name Age  of Onset    Diabetes Mother      Hypertension Mother      Colon cancer Father      Hypertension Brother ##Brother1        SOCIAL HISTORY:  Social History  Socioeconomic History    Marital status: Married   Tobacco Use    Smoking status: Never    Smokeless tobacco: Never   Substance and Sexual Activity    Alcohol use: No    Drug use: No    Sexual activity: Not Currently       PHYSICAL EXAM:  PHYSICAL EXAM VIA VIDEO VISIT:  ECOG Performance Status: 1 - Symptomatic but completely ambulatory   On VV, patient is laying in bed and speaking slowly, difficulty with word finding. Cannot complete sentences.   Vital Signs: No vitals  General:  In NAD, but appears weak and confused  Respiratory:  Normal breathing, No respiratory distress        LABORATORY DATA:  Serum PEP Interp.   Date Value Ref Range Status   08/03/2019 Abnormal (A) Normal pattern. 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 Paraprotein spike = 1.2 g/dL.  Interpretation by Pathologist: Clelia Schaumann, M.D.       M-Protein (monoclonal), Serum   Date Value Ref Range Status   10/13/2021 0.4 (H) Not Observed g/dL Final   08/03/2019 1.2 (A) Negative g/dL Final     SPEP - External   Date Value Ref Range Status   08/04/2020 0.8  Final     Immunofixation Electrophoresis, serum   Date Value Ref Range Status   12/12/2018 IgG Kappa (A) Negative Final     Comment:     Paraprotein present, IgG Kappa  Interpretation by Pathologist: Clelia Schaumann, M.D.       Lab Results   Component Value Date    IgA, Serum 20 (L) 10/13/2021    IgA - External 12 08/04/2020    IgG 585 (L) 10/13/2021    IgG - External 989 08/04/2020    IgM, Serum 7 (L) 10/13/2021    IgM - External 14 08/04/2020    Kappa (Serum, Free) - External 70.6 08/04/2020    Kappa Light Chain, Serum, Free 32.2 (H) 10/13/2021    Lambda (Serum, Free) - External 3.9 08/04/2020    Lambda Light Chain, Serum, Free 2.2 (L) 10/13/2021    Kappa/Lambda  Ratio, Serum, Free 14.64 (H) 10/13/2021          PATHOLOGY:  As noted above.    RADIOLOGY:  As noted above.    ASSESSMENT &PLAN  The patient is a 61 y.o. female who presents for evaluation of myeloma.    # IgG kappa MM: Prior lines of therapy include (1) VRd --> MEL ASCT --> observation, (2) Rev --> Rd, and now (3) DARA-Pom/dex started 04/2020 for M-spike rising to 1.1. Now down to 0.8 as of 07/07/2020, although some of this may be DARA interference of course. [facility] (when surgery was on hold), but then resumed thereafter. 02/2021 SPEP M protein and SFLC downtrending and 08/18/2021 MM labs stable   - Patient has history of allergic reactions and HAE (was monitored 1-3 hrs after first doses, got Singulair pre-med initially) but no issues so far with DARA.   - HOLD DARA-Pd for 2-4 weeks due to Norovirus and persistent diarrhea  DARA once a month (last had this 1st week of Dec)  Pom 62m daily, 21 of 28 days (HOLD)  Dex once weekly, 21 of 28 days (HOLD)- we talked about stopping the dex but the pt wants to continue for now as it helps  with the diarrhea  - no recent labs  - Continue monthly MM labs  - next line will consider CFZ/seli/dex but will need to discuss if seli AEs would be ok with her     #FTT: patient's performance status has been declining over the last 2-3 months, but is difficult to fully appreciate as our visit have been via telehealth.  Today, patient is in bed and and unable to have a proper conversation.  She is unable to answer questions appropriately. She needs stat labs, infection work up and brain imaging.  - urged patient's daughter Ariel Braun to call 911 and have patient transported safely to [facility] for full evaluation  - I will communicate with [facility] for proper sign out and FYI Dr. Iona Coach  - ED, please order cbc, cmp, ldh, spep, sIFE, serum free light chains, immunoglobulins, UA and culture, brain imaging    #Elevated LFTs: slightly  increase since 05/2021  - CTM  - consider liver ultrasound    #Diarrhea: unclear cause, but patient first noted it after covid vaccinations. Not helped with prevalite but imodium helps. 08/27/21 CDiff and O+P negative, but + for norovirus.  - supportive care for norovirus  - recommend holding myeloma therapy for 2 weeks   - Dr. Iona Coach rx'ed lomotil which she has not used  - follow up GI  - bentyl prn for abdominal pain    # Anxiety, emotional distress: Causes of stress include her role as sole caregiver to her son (intellectually disabled, severe hyperparathyroidism), husband (diabetes), and herself. Also stressed about the myeloma itself and its prognosis, and also about COVID.   - follow up SMS    - Can consider oncopsychology referral down the line    # Constipation: resolved.  Currently with diarrhea.    # Gr3 neutropenia: On 11/06/20 labs ([place] 600) isolated. Possibly pom-related. No fevers or infectious symptoms. [place] is typically borderline, around 1000.    - Patient advised to monitor for infection symptoms and notify local hematologist immediately  - continue zarxio for [place] <1000   - Continue to monitor    # R breast lesion: Noted on 12/2019 PET-CT, then confirmed on 04/2020 R breast US. She got the breast biopsy 07/08/2020 showed papillary lesion, recommending excisional biopsy which returned as benign, per patient   - follow up Dr. Iona Coach (local oncologist) and Dr. Maudie Mercury (surgeon)    # Hereditary angioedema:   - Follows with allergist locally   - No issues with DARA-Pd    # History of PEs: Occurred while on Revlimid.   - Continue low-dose Eliquis indefinitely, particularly while on Pom    #Bone health  - if she did not finish 2 years of zometa, then would recommend finishing a total of 2 years AFTER she finishes the dental work she needs currently    [facility]  - consider colonoscopy when ready    #Ppx:  - Continue acyclovir   - Continue Eliquis  - s/p [place] #1 02/26/20, #2 03/18/20, $3 08/14/20, #4 02/02/21, #5 06/20/2021,  bivalent booster due now  - flu vaccine recommended  - 11/06/20 covid ab neg  - repeat covid Ab 03/03/21 was neg   - s/p evusheld 04/01/21, recommend repeat dosing now    #Follow-up:  Monthly visits with Local Doc; 2 months with Dr. Jacelyn Grip      Thank you for involving me in the care of your patient. Please do not hesitate to call me anytime to discuss this patient.  Kathrynn Running NP        Cc:    Dr. Iona Coach (Heme)  Dr. Herbert Moors (Surgeon)          This patient has a high complexity problem: multiple myeloma, which is an incurable malignancy and poses a threat to life and bodily function.        Regarding data complexity, I have reviewed the following tests: CBC, CMP, SPEP, light chains     Medical decision making: high risk.  Advised continue holding Dara/Pom/Dex  for multiple myeloma due to active infection and now FTT.  The patient will continue on this therapy until progression or intolerance and therefore must  be closely monitored for toxicities, which may include low blood counts, infections, clots, secondary malignancies, GI toxicities, peripheal neuropathy, fatigue.  The patient was extensively counseled on this and agrees to continue with the treatment plan.     Communication sent to local hematologist and awaiting return call.  Will communicate with [facility] once patient is amenable to go.     I performed this evaluation using real-time telehealth tools, including a live video Zoom connection between my location and the patient's location. Prior to initiating, the patient consented to perform this evaluation using telehealth tools.

## 2021-12-02 NOTE — Patient Instructions (Addendum)
Please call 911 and have your mom be brought to ED for full work up    Continue hold chemo for now    Follow up with PCP and Dr. Iona Coach upon discharge    Follow up with Dr. Jacelyn Grip in 1 month

## 2021-12-05 NOTE — Telephone Encounter (Signed)
Patient is currently hospitalized at [facility]. She was taken in to the ER by her daughter on 1/18 at the recommendation of Trish Mage following a video visit where it was brought to our attention that the patient was becoming increasingly weak. She is currently in the ICU being treated for shock and is intermittently requiring pressor support. Of note she is pancytopenic with a Hb as low as 6.6 and an elevated lactate up to 11. Her doctors are recommending urgent RBC transfusion but the patient refused until she speaks to someone from [place].    On speaking to the patient, she states she is concerned the lab values are incorrect as she is usually not pancytopenic while at [facility].Spent a long time reassuring the patient that her lab values are correct given her symptoms, elevated lactate and need for pressor support. Urged her to accept RBC transfusion and reassured her that I will let her care team know what is going on. Also spoke to the patients daughter to let her know how serious her mothers condition is and reiterate the fact that she will likely benefit from a transfusion. Patient and daughter assured me that she will accept a transfusion.    Duffy Bruce, MD PhD  Hematology Fellow

## 2021-12-08 NOTE — Progress Notes (Signed)
Spoke to Dr Manuella Ghazi ICU attending [phone] regarding patient care.  Per Dr. Manuella Ghazi, patient with worsening performance status.  Remains encephalopathic and now with liver failure.  Infectious work up negative except for known history of norovirus.  Brain imaging neg. ID consulted this morning.  Thinking about consulting [facility] liver transplant team for their thoughts.    Dr. Jacelyn Grip recommends BMBX if coagulation factors within parameters of the procedure.

## 2021-12-09 DIAGNOSIS — C9000 Multiple myeloma not having achieved remission: Secondary | ICD-10-CM

## 2021-12-09 DIAGNOSIS — D849 Immunodeficiency, unspecified: Secondary | ICD-10-CM

## 2021-12-09 DIAGNOSIS — A419 Sepsis, unspecified organism: Secondary | ICD-10-CM

## 2021-12-09 MED ORDER — POLYETHYLENE GLYCOL 3350 17 GRAM ORAL POWDER PACKET
17 gram | Freq: Every day | ORAL | Status: DC | PRN
Start: 2021-12-09 — End: 2022-01-15

## 2021-12-09 MED ORDER — SODIUM CHLORIDE 0.9 % (FLUSH) INJECTION SYRINGE
0.9 | Freq: Two times a day (BID) | INTRAMUSCULAR | Status: DC
Start: 2021-12-09 — End: 2022-01-15
  Administered 2021-12-20 – 2022-01-12 (×5): via INTRAVENOUS

## 2021-12-09 MED ORDER — PIPERACILLIN-TAZOBACTAM 4.5 GRAM INTRAVENOUS SOLUTION
4.5 gram | Freq: Three times a day (TID) | INTRAVENOUS | Status: AC
Start: 2021-12-09 — End: 2021-12-12
  Administered 2021-12-10 – 2021-12-12 (×7): 4.5 g via INTRAVENOUS

## 2021-12-09 MED ORDER — LACTATED RINGERS INTRAVENOUS SOLUTION
INTRAVENOUS | Status: DC
Start: 2021-12-09 — End: 2021-12-10

## 2021-12-09 MED ORDER — SODIUM CHLORIDE 0.9 % INTRAVENOUS PIGGYBACK
0.9 | Freq: Once | INTRAVENOUS | Status: AC
Start: 2021-12-09 — End: 2021-12-10
  Administered 2021-12-10: 09:00:00 via INTRAVENOUS

## 2021-12-09 MED ORDER — SENNOSIDES 8.6 MG TABLET
8.6 | Freq: Every day | ORAL | Status: AC
Start: 2021-12-09 — End: 2021-12-13

## 2021-12-09 MED ORDER — SODIUM CHLORIDE 0.9 % (FLUSH) INJECTION SYRINGE
0.9 | INTRAMUSCULAR | Status: DC | PRN
Start: 2021-12-09 — End: 2022-01-15

## 2021-12-09 NOTE — Other (Signed)
INITIAL ASSESSMENT OBTAINED FROM REFERRING HOSPITAL'S CLINICIAN(S)        Name of Clinician providing clinical information: ben rn  Priority: H-IP/ Referring Facility: [facility] hospital  Accepting Diagnosis: Multiple Myeloma  Requested Treatment: Liver biopsy, bone marrow biopsy, possible chemo  HPI:  Past Medical History:   Diagnosis Date    Acid reflux disease     Anxiety     Depression     Hereditary angioedema     Multiple myeloma, without mention of having achieved remission     IgG kappa    Myeloma 05/10/2011       PMH:   Past Surgical History:   Procedure Laterality Date    AUTOLOGOUS STEM CELL TRANSPLANTATION  05/12/11    BREAST CYST EXCISION  1981         COVID: Requested an updated or new test.  COVID results: Tested, but results pending.    Pre Arrival Additional Info  Allergies: (P) caffine  Devices/therapies/lines:piv    Pre arrival neuro status  Neuro (WDL): (P) Exceptions to WDL  Level of Consciousness: (P) Awake, Responds to verbal  Orientation Level: (P) Oriented to person, Oriented to place  Cognition: (P) Appropriate judgment, Appropriate safety awareness, Appropriate attention/concentration, Appropriate for developmental age, Follows commands  Speech: (P) Clear  Best Eye Response: (P) Spontaneous  Best Verbal Response: (P) Confused  Best Motor Response (upper extremities): (P) Obeys commands  Glasgow Coma Scale Score: (P) 14  L Hand Grip: (P) Weak   R Hand Grip: (P) Weak  RUE Motor Response (best voluntary): (P) Movement but not against gravity  LUE Motor Response (best voluntary): (P) Movement but not against gravity  RLE Motor Response (best voluntary): (P) Movement but not against gravity  LLE Motor Response (best voluntary): (P) Movement but not against gravity  Able to Ambulate?: (P) No  Pre Arrival Vitals  Pre-Hospital/Transfer Temp: (P) 38.2 C (100.8 F)  Pre-Hospital/Transfer Pulse: (P) 87  Cardiac Regularity: (P) Regular  Cardiac Rhythm: (P) Normal sinus rhythm  Pre-Hospital/Transfer  BP: (P) 106/72  MAP (mmHg): (P) 83 mmHg  Pre-Hospital/Transfer Resp: (P) 25  Pre-Hospital/Transfer SpO2: (P) 92 %  O2 Device: (P) None (Room air)  Pre-Hospital/Transfer Weight: (P) 77.2 kg (170 lb 3.1 oz)  Pain Level: (P) 0     Pre arrival respiratory assessment  Breathing: (P) Spontaneous  Respiratory Pattern: (P) Regular, Unlabored  R Breath Sounds: (P) Clear, Diminished  L Breath Sounds: (P) Clear, Diminished  Cough: (P) Non-productive  O2 Device: (P) None (Room air)  Additional Isolation Needs: (P) Contact (Norovirus)  Intubated:    Skin Color/Condition (WDL): (P) Exceptions to WDL (moisture associated on coccyx)  Cardiac (WDL): (P) Exceptions to WDL  Breathing: (P) Spontaneous     Gastrointestinal (WDL): (P) Exceptions to WDL  Genitourinary (WDL): (P) Exceptions to [facility]  Pre Arrival Vascular Access (Does not create LDA on flowsheet): (P) Peripheral IV (triple IJ right)  Infusions: Pre Arrival Medication Infusions  Other Medication Gtt #1: (P) d5LR @ 50/HR         Pre Arrival Labs  Sodium - Pre Arrival Lab: (P) 142  Potassium- Pre Arrival Lab: (P) 3.8  Chloride - Pre Arrival Lab: (P) 110  CO2 - Pre Arrival Lab: (P) 22  Creatinine - Pre Arrival Lab: (P) .58  Glucose - Pre Arrival Lab: (P) 77  Calcium - Pre Arrival Lab: (P) 7.7  Hemoglobin - Pre Arrival Lab: (P) 12.2  Hematocrit - Pre Arrival Lab: (P) 36.3  WBC-Pre Arrival Lab: (P) 3.9  Platelets -Pre Arrival Lab: (P) 70  PT/INR -Pre Arrival Lab: (P) 18.6/1.58  PTT -Pre Arrival Lab: (P) 40.6  AST - Pre Arrival Lab: (P) 118  ALT - Pre Arrival Lab: (P) 150  Bilirubin - Pre Arrival Lab: (P) 12.1  Other Lab Values-Pre Arrival Lab: (P) covid screen pending

## 2021-12-09 NOTE — H&P (Signed)
MALIGNANT HEMATOLOGY HOSPITALIST H&P NOTE - ATTENDING ONLY     My date of service is 12/09/2021.    Treatment Team     Provider Service Role Specialty From To Pager    Malignant Hematology Admitting Hospitalist -- Primary Team  -- -- --   Lookingglass, MD Malignant Hematology Attending Provider Hematology and Oncology 12/09/21 [phone] --             Primary [address] [address]  [phone]    Family/Surrogate Contact Info  Daughter: Ciana Simmon, primary point of contact. Please seen contact info in apex.     Chief Complaint:     Diarrhea, hypotension, new acute liver failure.     History of Present Illness    History obtain from chart review, patient with baseline encephalopathy.     Ariel Braun is a 61 year old female with pmx of MM. 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 M spike 32, kappa 596.9 mg/L, IgG 4750, B2M 3.88. The patient has received VRd X4 (achieving VGPR) then Mel 200 ASCT 05/12/11. 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 She met the criteria for PD on 02/19/2020 and was started on third line DARA PD in June 2021.     The patient was recently hospitalized at [facility] on 12/02/21 after recommendation of Trish Mage during a video visit, she was complaining of increased weakness, persistent diarrhea. The patient had reporting having chronic diarrhea since her COVID booster in August 2022 and had developed clinical deterioration over the past month.     Her hospital course was complicated by ICU admission for hypovolemic shock intermittently requiring pressor support (phenylephrine gtt).  She was found to be pancytopenic with hg of 6.6 and Lactate of 11. Patient had initially refused prbc transfusion. Patient was found to be positive for norovirus. LFTs were elevated consistent with acute liver failure  (AST 118, [name], Alp 958, TB 12.1). procal 1.2. MRI of the liver with MRCP was done which showed hepatic steatosis. RUQ ultrasound showed an enlarged liver with large 2cm stone in the gallbladder. HIDA scan was done: findings were not consistent with cystic duct obstruction or acute cholecystitis. The patient was evaluated by GI and advised to follow with hepatology Dr. Loman Brooklyn for acute liver failure workup, liver biopsy. Patient also has a history of pulmonary embolism on eliquis which was held for progressive anemia. Patient was found to have an appropriate rise in cortisol with ACTH stimulation, patient refusing decadron. Creatinine was wnls.     Per daughter, con fusion started about a week prior to coming into the hospital at the last admission. There has been some improvement from baseline. Still having diarrhea, although the frequency of the diarrhea has decreased during course of hospitalization.     See labs from OSH:     Oncologic History:     09/07/2010: She was diagnosed with IgG kappa MM - BMBx showed 8% plasma cells with dup (1q). M spike 32, kappa 596.9 mg/L, IgG 4750, B2M 3.88. The patient has received VRd X 4 (achieving VGPR) then Mel 200 ASCT 05/12/11. 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 She met the criteria for PD on 02/19/2020 and was started on  third line DARA PD in June 2021.   04/2020: Started DARA-Pd 04/2020 for M spike rising 1.1 (pom 26m 21/28 days starting 05/14/20, dara starting 05/16/2020) which she was tolerating well.   09/2020 [facility]  02/2021: SPEP M protein and SFLC down trending  08/18/21: MM labs stable.   12/22: Patient had DARA held due to persistent diarrhea. Last dose was 10/20/21. Pom 2 mg every day D1-21 was also on hold. Dexmethasone once weekly D1-21, with plans to hold but was resume due to symptomatic improvement with diarrhea. Next line would be to consider CFZ/seli/dex,  but will need to discuss re: seli Aes.     Past Medical History:   Diagnosis Date    Acid reflux disease     Anxiety     Depression     Hereditary angioedema     Multiple myeloma, without mention of having achieved remission     IgG kappa    Myeloma 05/10/2011     Past Surgical History:   Procedure Laterality Date    AUTOLOGOUS STEM CELL TRANSPLANTATION  05/12/11    BREAST CYST EXCISION  1981     Immunization History   Administered Date(s) Administered    DT 01/21/2012, 07/06/2012    DTaP 07/06/2012    Diphtheria 01/21/2012, 07/06/2012    Hep B, Unspecified 08/28/2012    Hepatitis B 01/21/2012, 08/28/2012    HiB PRP-OMP 01/21/2012, 07/14/2012    HiB PRP-T 01/21/2012, 02/12/2012    Hib, Unspecified 01/21/2012, 07/14/2012    IPV 01/21/2012, 07/24/2012    Influenza 09/30/2015, 09/23/2017, 11/12/2018    Influenza Inj Prsv Free 11/12/2018    Influenza Quad MDCK, Pfree 10/01/2016    Influenza Seasonal Inj 10/13/2009, 08/19/2012, 09/23/2017    Influenza Split 09/14/2013, 10/25/2014, 09/23/2017    Influenza, Unspecified 10/13/2009, 08/19/2012    Influenza-CC 09/28/2019    Pfizer Sars-Cov-2 Vaccine (Purple Top) 03/18/2020, 08/14/2020    Pneumococcal Conjugate (Prevnar 13) 01/21/2012, 06/26/2012    Pneumococcal Polysaccharide (PPSV 23) 04/12/2017    Td 01/21/2012, 07/06/2012    Tdap 10/15/2005     This patient is immunocompromised and will not mount an adequate antibody response so will not recieve the pneumococcal or influenza vaccination during this admission. They will be screened and vaccinated in clinic when appropriate.    Allergies: Caffeine     No medications prior to admission.       Social History     Socioeconomic History    Marital status: Married   Tobacco Use    Smoking status: Never    Smokeless tobacco: Never   Substance and Sexual Activity    Alcohol use: No    Drug use: No    Sexual activity: Not Currently     Family History has not been Marked As Reviewed in the last 24  hours. Please review it in the   "History" navigator and refresh this [place] Alternatively you can free text your own Family History here.      Review of Systems   Unable to perform ROS: Patient unresponsive   Constitutional: Negative.    HENT: Negative.    Eyes: Negative.    Respiratory: Negative.    Cardiovascular: Negative.    Gastrointestinal: Negative.    Genitourinary: Negative.    Musculoskeletal: Negative.    Skin: Negative.    Neurological: Negative.    Endo/Heme/Allergies: Negative.    Psychiatric/Behavioral: Negative.      ROS: (10+) systems documented  in ROS SMARTBLOCK    Vitals       No intake or output data in the 24 hours ending 12/09/21 1959    Pain Score:      Wt Readings from Last 2 Encounters:   12/12/18 96.3 kg (212 lb 4.9 oz)   08/08/18 98.9 kg (218 lb 1.6 oz)       KPS  100    Physical Exam  Vitals and nursing note reviewed.   Constitutional:       Appearance: She is ill-appearing.      Comments: Alert and oriented X 1-2, slow to respond. Confused.    HENT:      Head: Normocephalic and atraumatic.      Nose: Nose normal.   Eyes:      General: Scleral icterus present.      Extraocular Movements: Extraocular movements intact.      Conjunctiva/sclera: Conjunctivae normal.      Pupils: Pupils are equal, round, and reactive to light.   Cardiovascular:      Rate and Rhythm: Normal rate and regular rhythm.      Pulses: Normal pulses.      Heart sounds: Normal heart sounds. No murmur heard.    No friction rub. No gallop.   Pulmonary:      Effort: Pulmonary effort is normal. No respiratory distress.      Breath sounds: Normal breath sounds. No stridor. No wheezing, rhonchi or rales.   Abdominal:      General: Abdomen is flat. Bowel sounds are normal. There is no distension.      Palpations: Abdomen is soft. There is no mass.      Tenderness: There is no abdominal tenderness. There is no guarding or rebound.      Hernia: No hernia is present.      Comments: Foley catheter in place, urine appears  dark, scant and purulent.   No suprapubic tenderness.   Flapping asterixis+   Musculoskeletal:         General: Normal range of motion.      Cervical back: Normal range of motion and neck supple.      Right lower leg: Edema present.      Left lower leg: Edema present.      Comments: Strength 1/5 in the bilateral lower extremities, unable to raise them to gravity.   Pitting edema 3-4+ in the bilateral lower extremities.   Anasarca in the abdomen and thighs bilaterally.    Skin:     General: Skin is warm.      Capillary Refill: Capillary refill takes less than 2 seconds.      Coloration: Skin is jaundiced.      Findings: Bruising present.      Comments: ecchymoses on the sternum   Neurological:      Mental Status: She is alert. She is disoriented.      Motor: Weakness present.         Data    CBC  No results found in last 72 hours    Coags  No results found in last 72 hours    Chem7  No results found in last 72 hours    Electrolytes  No results found in last 72 hours    Liver Panel  No results found in last 72 hours    Lactate  No results found in last 72 hours    TSH  No results found in last 72 hours    Labs from  OSH:   12/02/21:   WBC 10.75, hg 13.5, Hct 42.7, MCV 100.7, Platelets 175K--> 81K, Lactate 11.4, procal 2.6  RVP: influenza A, B RSV, COVID 19 negative.   TSH: 5.21, FT4: .78  CMP: 132, Ca 7.2, TP 3.1, Albumin 1.1 AST 152--> 121, ALT 130--> 110, Creatinine 1.0, ALP 1314, TB 5.4  UA  Negative.   Hg trends: 13.5--> 9.9--> 6.6    12/03/21:   Ig M <5, IgG 447, IgA 20, Ig E <2, SFLC Kappa 16.6, Lambda 1.6, Kappa/Lamda 10.38  WBC 2.2, Hg 6.6  PBS: moderate anisocytosis, ovalocytes, few large platelets.   D-Dimer .47, Fibrinogen 98, PT 40.3, INR 4.30, PTT 37.2    12/06/21:   Hepatitis B antigen non reactive, Hepatitis C antibody, non reactive, Hepatitis A non reactive.   Smooth muscle antibody negative.   Mitochondrial antibody negative.           Radiology Results  No results found.    All new lab results, reports,  and consult noted reviewed.    Outside records reviewed from [facility]. Summary of outside findings:     CT abdomen pelvis without contrast  Result Date: 12/02/2021  1. There are scattered sclerotic lesions identified throughout the lumbar spine and pelvis which have increased in size and number since 03/10/2018 suspicious for osseous metastases which have increased in severity. No pathologic fracture could be identified. 2. No significant liver lesion, lymphadenopathy or mass could be identified throughout the intra-abdominal compartment. 3. Fatty liver. 4. A small left pleural effusion is present which has increased in size. Mild left basilar atelectasis/infiltrates have increased. Mild right basilar atelectasis/infiltrate is present. 5. No acute intra-abdominal inflammatory process, free fluid, free air, suspicious mass or abscess.     CT head without contrast  Result Date: 12/02/2021  1. No acute intracranial findings. No evidence of acute infarct or intracranial hemorrhage..     NM hepatobiliary    Result Date: 12/09/2021  1. Normal radiotracer filling of the gallbladder is identified, therefore the exam is not consistent with cystic duct obstruction or acute cholecystitis. 2. Poor radiotracer uptake and diminished radiotracer excretion by the liver compatible with sequela of hepatocellular dysfunction.    Result Date: 12/08/2021  Pleural fluid and atelectasis more on the left. No overt failure. Thank you for your referral. Professional imaging services provided by [facility]. Referring physician and health care providers may call [phone] to speak with an interpreting radiologist.   Ultrasound leg bilateral venous    Result Date: 12/02/2021  No evidence of deep venous thrombosis within the limitations as described.     MRI liver with and without contrast    Result Date: 12/08/2021  1. Cholelithiasis with mild gallbladder wall edema. Findings are disfavored to represent acute  cholecystitis. Gallbladder wall thickening can be seen in the setting of underlying liver disease or cardiac disease. 2. No choledocholithiasis or biliary obstruction. 3. Marked hepatic steatosis. 4. Moderate bilateral pleural effusions and adjacent atelectasis.     US Abdomen RUQ    Result Date: 12/03/2021  1. Liver is enlarged with diffuse increased echogenicity. Finding is nonspecific but consistent with fatty infiltration. 2. Large 2 cm stone in the gallbladder, no sonographic evidence of acute cholecystitis. 3. Two gallbladder polyps measuring 6 mm and 3 mm.    TTE:   Date: 12/03/2021 Start: 01:32 PM  Technical Quality: Adequate visualization Study Location: Portable  Indications: Bradycardia.  Study Status: Routine  Patient Status: In Patient  Height:  62 inches Weight: 163 pounds BSA: 1.75 m^2 BMI: 29.81 kg/m^2  Rhythm: Normal sinus rhythm HR: 61 bpm BP: 104/69 mmHg    Conclusions  Summary  The left ventricular cavity size is normal.  Normal LV wall thickness.  The left ventricular ejection fraction is in normal range at 68% by biplane  MOD.  No LV regional wall motion abnormalities.  Normal diastolic function for age.    Gastrointestinal Pathogens Panel by PCR [[id number]] (Abnormal) Collected: 12/02/21 2009   Order Status: Completed Lab Status: Final result Updated: 12/03/21 1144   Specimen: Stool   Campylobacter (C. jejuni/coli/C. upsaliensis) Not Detected   Clostridium difficile toxin A/B Not Detected   Plesiomonas shigelloides Not Detected   Salmonella Not Detected   Vibrio Not Detected   Vibrio cholerae Not Detected   Yersinia enterocolitica Not Detected   Enteroaggregative Escherichia coli (EAEC) Not Detected   Enteropathogenic Escherichia coli (EPEC) Not Detected   Enterotoxigenic Escherichia coli (ETEC) lt/st Not Detected   Shiga-like toxin-producing Escherichia coli (STEC) Not Detected   Shigella/ Enteroinvasive Escherichia coli (EIEC) Not Detected   Cryptosporidium Not Detected   Cyclospora  cayetanensis Not Detected   Entamoeba histolytica Not Detected   Giardia lamblia (G. intesintalis and G. duodenalis) Not Detected   Adenovirus F 40/41 Not Detected   Astrovirus Not Detected   Norovirus GI/GII Detected     Problem-based Assessment & Plan    # MM with lytic lesions in the L spine  IgG Kappa MM on 09/07/2010,   2011: Bortezomib, Lenalidomide, Dexamethasone  June 2012: Autologous Stem Cell Transplant  September 2015: Restarted revlimid 15 mg every other day, Dexamethasone 8 mg PO weekly.   June 2020 due to progression of M spike Revlimid was increased to 15 mg PO D1-21 every 28 days, plus dexamethasone  July 2021: started daratumumab, Pomylast 2 mg D 1-21, Q28 days, dexamethasone 12 mg PO Q weekly.   Primary oncologist: Dr. Iona Coach,  Dr. Kennon Rounds [facility]  Receiving dexamethasone 12 mg weekly for diarrhea  Receiving MT with daratumumab with pomalyst and dex, most recent dose 10/16/21, has since been on hold due to worsening diarrhea.     DATA:   09/07/2010: IgG Kappa MM  12/02/2021: Ig M <5, IgG 447, IgA 20, Ig E <2, SFLC Kappa 16.6, Lambda 1.6, Kappa/Lamda 10.38    Chemo:   Holding Daratumumab and Pomalyst, last dose 10/20/21, given recent infection and acute liver failure.    Supportive Care:   Transfusion: Keep Hg >7, and Platelets >10.     Per daughter, has autoantibodies in blood was a difficult match. Would like to be contacted prior to transfusion.     Line: Right IJ in place from OSH, found to be in the RV on CXR, confirmed with radiology. VATs consult to retract Right IJ and repeat CXR in the AM.     Outpatient Oncologist: Dr. Kennon Rounds    Dispo: TBD, after resolution of sepsis and liver failure    []  Obtain labs: CBC with diff, CMP, LDH,  UA, UCx, Calcium, Mg    # Immunocompromise  # At risk for infections due to malignancy  # Shock, likely hypovolemic vs distributive in the setting of chronic diarrhea, + norovirus infection   # Acute on chronic diarrhea, norovirus positive., diff and OP  negative.   -No prior evidence of adrenal insufficiency, AM cortisol levels normal, previously on dexamethasone 8 mg PO Qweekly, but has been held since early December.   -Continue IV  Zosyn 4.5 g Q6hrs  -Monitor I/O's    []  Obtain CBC with diff, CMP, Blood cultures X 2, Procal, UA, CXR    OI ppx: Acyclovir 400 mg PO BID, hold for now while inpatient, consider restarting at discharge.     # Grade III neutropenia, at baseline  # Leukopenia  Data: WBC of 2.2 on 12/05/21, on Zarxio at home  Plan: Continue to monitor    # Acute liver failure of unclear etiology  # Acute on chronic encephalopathy likely 2/2 infectious vs hepatic vs metabolic  # Hyperbilirubinemia  # Anasarca  # Hypoalbuminemia    Data:   -Steady rise in LFTs in 05/2021,  -12/02/21: AST 152--> 121, ALT 130--> 110, TB 5.4, Albumin 1.1  -Hepatitis panel negative.   -CTAP 12/02/21: Scattered sclerotic lesions in the lumbar spine, hepatic steatosis.   -RUQ ultrasound: liver enlarged with diffuse increased echogenicity, consistent with fatty infiltration. 2cm stone in the gallbladder.   -HIDA scan: Normal radiotracer filling of the gallbladder is identified, therefore the exam is not consistent with cystic duct obstruction or acute cholecystitis.    Plan:   []  Hepatology consult in the AM for consideration of liver biopsy  []  Hepatitis panel  []  Speech consult for encephalopathy, at OSH on soft diet.     # History of PE, diagnosed in April 2019,   - hold home Diggins for now given recent anemia and concerns for bleeding.    -monitor for signs and symptoms of worsening bleeding.     # Elevated TSH at OSH, likely 2/2 subclinical hypothyroidism  -repeat TSH when acute sickness resolved.     # Anxiety, emotional distress:   -Patient has stressful social situation, intellectually disabled son, and regarding underlying disease process.     No new Assessment & Plan notes have been filed under this hospital service since the last note was generated.  Service: Malignant  Hematology      VTE PPx:  Hold heparin for now given thrombocytopenia, with concern for HIT (4 points) intermediate probability for HIT.      Severity of Illness  Avoid opoid narcotics and benzodiazepines, given encephalopathy.     The patient has the following conditions and comorbidities:  - Sepsis (present on admission)    - Malignancy associated fatigue on admission  - Neoplasm/malignancy associated pain  - Hypoalbuminemia (present on admission)  - Fluid status: Hypovolemia on admission     - Cachexia  - Encephalopathy: Metabolic encephalopathy    Code Status: FULL    Carmine Savoy, MD  12/09/2021

## 2021-12-10 ENCOUNTER — Inpatient Hospital Stay
Admission: TF | Admit: 2021-12-10 | Discharge: 2022-01-15 | Disposition: A | Discharge status: Other Acute Inpatient Hospital | Payer: PRIVATE HEALTH INSURANCE | Source: Intra-hospital | Attending: Hematology | Admitting: Hematology

## 2021-12-10 LAB — COMPLETE BLOOD COUNT WITH DIFFERENTIAL
Abs Basophils: 0.01 10*9/L (ref 0.00–0.10)
Abs Eosinophils: 0.01 10*9/L (ref 0.00–0.40)
Abs Imm Granulocytes: 0.04 10*9/L (ref <0.10)
Abs Lymphocytes: 0.38 10*9/L — ABNORMAL LOW (ref 1.00–3.40)
Abs Monocytes: 0.13 10*9/L — ABNORMAL LOW (ref 0.20–0.80)
Abs Neutrophils: 1.96 10*9/L (ref 1.80–6.80)
Hematocrit: 31.7 % — ABNORMAL LOW (ref 36.0–46.0)
Hemoglobin: 10.6 g/dL — ABNORMAL LOW (ref 12.0–15.5)
MCH: 31.9 pg (ref 26.0–34.0)
MCHC: 33.4 g/dL (ref 31.0–36.0)
MCV: 96 fL (ref 80–100)
MPV: 13.4 fL — ABNORMAL HIGH (ref 9.1–12.6)
Platelet Count: 63 10*9/L — ABNORMAL LOW (ref 140–450)
RBC Count: 3.32 10*12/L — ABNORMAL LOW (ref 4.00–5.20)
RDW-CV: 22.4 % — ABNORMAL HIGH (ref 11.7–14.4)
WBC Count: 2.5 10*9/L — ABNORMAL LOW (ref 3.4–10.0)

## 2021-12-10 LAB — VITAMIN B12: Vitamin B12: 780 ng/L (ref 301–816)

## 2021-12-10 LAB — LACTATE DEHYDROGENASE, BLOOD: LDH, ser/pl: 493 U/L — ABNORMAL HIGH (ref 125–243)

## 2021-12-10 LAB — COMPREHENSIVE METABOLIC PANEL (BMP, AST, ALT, T.BILI, ALKP, TP ALB)
AST: 101 U/L — ABNORMAL HIGH (ref 5–44)
Alanine transaminase: 116 U/L — ABNORMAL HIGH (ref 10–61)
Albumin, Serum / Plasma: 2 g/dL — ABNORMAL LOW (ref 3.4–4.8)
Alkaline Phosphatase: 745 U/L — ABNORMAL HIGH (ref 38–108)
Anion Gap: 6 (ref 4–14)
Bilirubin, Total: 14.6 mg/dL — ABNORMAL HIGH (ref 0.2–1.2)
Calcium, total, Serum / Plasma: 7.7 mg/dL — ABNORMAL LOW (ref 8.4–10.5)
Carbon Dioxide, Total: 25 mmol/L (ref 22–29)
Chloride, Serum / Plasma: 110 mmol/L (ref 101–110)
Creatinine: 0.53 mg/dL — ABNORMAL LOW (ref 0.55–1.02)
Glucose, non-fasting: 87 mg/dL (ref 70–199)
Potassium, Serum / Plasma: 3.4 mmol/L — ABNORMAL LOW (ref 3.5–5.0)
Protein, Total, Serum / Plasma: 3.2 g/dL — ABNORMAL LOW (ref 6.3–8.6)
Sodium, Serum / Plasma: 141 mmol/L (ref 135–145)
Urea Nitrogen, Serum / Plasma: 16 mg/dL (ref 7–25)
eGFRcr: 105 mL/min/{1.73_m2} (ref >59)

## 2021-12-10 LAB — THYROID STIMULATING HORMONE: Thyroid Stimulating Hormone: 2.65 mIU/L (ref 0.45–4.12)

## 2021-12-10 LAB — URINALYSIS WITH MICROSCOPY
Glucose, (UA): NEGATIVE mg/dL
Ketones, UA: NEGATIVE mg/dL
Nitrite: NEGATIVE
Protein, UA: 30 mg/dL — AB
RBCs, urine: >50 /HPF — ABNORMAL HIGH (ref <3)
Round Epith Cells: >10 /LPF
Specific Gravity: 1.037 — ABNORMAL HIGH (ref 1.002–1.030)
Squam Epith Cells: >10 /LPF
Urobilinogen: NEGATIVE mg/dL(EU/dL)
WBC Esterase: POSITIVE — AB
WBCs, UR: >50 /HPF — ABNORMAL HIGH (ref <5)
pH, UA: 6 (ref 4.5–8.0)

## 2021-12-10 LAB — ACTIVATED PARTIAL THROMBOPLASTIN TIME: aPTT: 43.1 s — ABNORMAL HIGH (ref 21.6–30.8)

## 2021-12-10 LAB — PHOSPHORUS, SERUM / PLASMA: Phosphorus, Serum / Plasma: 1.8 mg/dL — ABNORMAL LOW (ref 2.3–4.7)

## 2021-12-10 LAB — AMMONIA: Ammonia: 25 umol/L (ref 18–72)

## 2021-12-10 LAB — GAMMA-GLUTAMYL TRANSPEPTIDASE: Gamma-Glutamyl Transpeptidase: 1160 U/L — ABNORMAL HIGH (ref 8–59)

## 2021-12-10 LAB — COVID-19 RNA, RT-PCR/NUCLEIC ACID AMPLIFICATION: COVID-19 RNA, RT-PCR/Nucleic Acid Amplification: NOT DETECTED

## 2021-12-10 LAB — LACTATE, PLASMA: Lactate, plasma: 1.7 mmol/L (ref 0.5–2.2)

## 2021-12-10 LAB — FIBRINOGEN, FUNCTIONAL: Fibrinogen, Functional: 109 mg/dL — ABNORMAL LOW (ref 202–430)

## 2021-12-10 LAB — BILIRUBIN, DIRECT: Bilirubin, Direct: 10.1 mg/dL — ABNORMAL HIGH (ref <0.6)

## 2021-12-10 LAB — FOLATE: Folate, serum: 6.7 ng/mL (ref >4)

## 2021-12-10 LAB — MAGNESIUM, SERUM / PLASMA: Magnesium, Serum / Plasma: 1.8 mg/dL (ref 1.6–2.6)

## 2021-12-10 LAB — PROTHROMBIN TIME
INR: 1.4 — ABNORMAL HIGH (ref 0.9–1.2)
PT: 16.9 s — ABNORMAL HIGH (ref 11.6–15.0)

## 2021-12-10 LAB — URIC ACID, SERUM / PLASMA: Uric Acid, Serum / Plasma: 2.3 mg/dL — ABNORMAL LOW (ref 2.9–6.6)

## 2021-12-10 MED ORDER — SODIUM CHLORIDE 0.9 % (FLUSH) INJECTION SYRINGE
0.9 | Freq: Once | INTRAMUSCULAR | Status: AC | PRN
Start: 2021-12-10 — End: 2021-12-10
  Administered 2021-12-10: 23:00:00 via INTRAVENOUS

## 2021-12-10 MED ORDER — POTASSIUM CHLORIDE 10 MEQ/100ML IN STERILE WATER INTRAVENOUS PIGGYBACK
10 | Freq: Once | INTRAVENOUS | Status: AC
Start: 2021-12-10 — End: 2021-12-10
  Administered 2021-12-10: 12:00:00 via INTRAVENOUS

## 2021-12-10 MED ORDER — POTASSIUM CHLORIDE 10 MEQ/100ML IN STERILE WATER INTRAVENOUS PIGGYBACK
10 | Freq: Once | INTRAVENOUS | Status: AC
Start: 2021-12-10 — End: 2021-12-10
  Administered 2021-12-10: 15:00:00 via INTRAVENOUS

## 2021-12-10 MED ORDER — LIDOCAINE (PF) 10 MG/ML (1 %) INJECTION SOLUTION
10 | Freq: Once | INTRAMUSCULAR | Status: AC | PRN
Start: 2021-12-10 — End: 2021-12-10
  Administered 2021-12-10: 23:00:00 5 mL via INTRADERMAL

## 2021-12-10 MED ORDER — MAGNESIUM SULFATE 1 GRAM/100 ML IN DEXTROSE 5 % INTRAVENOUS PIGGYBACK
1 | Freq: Once | INTRAVENOUS | Status: AC
Start: 2021-12-10 — End: 2021-12-10
  Administered 2021-12-10: 12:00:00 via INTRAVENOUS

## 2021-12-10 MED ORDER — SODIUM CHLORIDE 0.9 % (FLUSH) INJECTION SYRINGE
0.9 | Freq: Two times a day (BID) | INTRAMUSCULAR | Status: DC
Start: 2021-12-10 — End: 2021-12-29
  Administered 2021-12-16 – 2021-12-29 (×13): via INTRAVENOUS

## 2021-12-10 MED ORDER — SODIUM CHLORIDE 0.9 % INTRAVENOUS SOLUTION
0.9 % | INTRAVENOUS | Status: DC
Start: 2021-12-10 — End: 2021-12-12
  Administered 2021-12-10: 10:00:00 75 mL/h via INTRAVENOUS
  Administered 2021-12-10 – 2021-12-12 (×2): via INTRAVENOUS

## 2021-12-10 MED ORDER — SODIUM CHLORIDE 0.9 % (FLUSH) INJECTION SYRINGE
0.9 | INTRAMUSCULAR | Status: DC | PRN
Start: 2021-12-10 — End: 2021-12-29
  Administered 2021-12-20: 05:00:00 10 mL via INTRAVENOUS

## 2021-12-10 MED ORDER — ACYCLOVIR SODIUM 50 MG/ML INTRAVENOUS SOLUTION
50 mg/mL | Freq: Two times a day (BID) | INTRAVENOUS | Status: DC
Start: 2021-12-10 — End: 2021-12-15
  Administered 2021-12-10 – 2021-12-15 (×11): via INTRAVENOUS

## 2021-12-10 MED ORDER — POTASSIUM CHLORIDE 10 MEQ/100ML IN STERILE WATER INTRAVENOUS PIGGYBACK
10 | Freq: Once | INTRAVENOUS | Status: AC
Start: 2021-12-10 — End: 2021-12-10
  Administered 2021-12-10: 14:00:00 via INTRAVENOUS

## 2021-12-10 MED ORDER — ONDANSETRON HCL (PF) 4 MG/2 ML INJECTION SOLUTION
4 | Freq: Three times a day (TID) | INTRAMUSCULAR | Status: DC | PRN
Start: 2021-12-10 — End: 2022-01-15
  Administered 2021-12-10 – 2021-12-31 (×10): via INTRAVENOUS
  Administered 2021-12-31 – 2022-01-02 (×2): 8 mg via INTRAVENOUS
  Administered 2022-01-04 – 2022-01-15 (×4): via INTRAVENOUS

## 2021-12-10 MED FILL — ACYCLOVIR SODIUM 50 MG/ML INTRAVENOUS SOLUTION: 50 mg/mL | INTRAVENOUS | Qty: 8

## 2021-12-10 MED FILL — POTASSIUM CHLORIDE 10 MEQ/100ML IN STERILE WATER INTRAVENOUS PIGGYBACK: 10 mEq/0 mL | INTRAVENOUS | Qty: 100

## 2021-12-10 MED FILL — PIPERACILLIN-TAZOBACTAM 4.5 GRAM INTRAVENOUS SOLUTION: 4.5 gram | INTRAVENOUS | Qty: 4500

## 2021-12-10 MED FILL — MAGNESIUM SULFATE 1 GRAM/100 ML IN DEXTROSE 5 % INTRAVENOUS PIGGYBACK: 1 g/00 mL | INTRAVENOUS | Qty: 100

## 2021-12-10 MED FILL — ONDANSETRON HCL (PF) 4 MG/2 ML INJECTION SOLUTION: 4 mg/2 mL | INTRAMUSCULAR | Qty: 4

## 2021-12-10 NOTE — Interdisciplinary (Signed)
Data:     Pt is a 61yo F with MM, admitted for diarrhea, hypotension, new acute liver failure.  per H&P. Please see inpatient chart for full medical history and details.  Covering SW was consulted by [facility] to visit to discuss local accommodations with Pts daughter.    SW introduced herself and her role to Baxter, Pts daughter, as Pt was resting. SW provided a guide to local accommodations and answered Jessica's questions re: staying locally. She indicated that Pt received a jury duty notice; SW indicated that Pts SW can follow up tomorrow. SW also provided education to [place] re: DMV temporary disability placard; she expressed interest in applying.    SW offered additional support and [name] declined any further needs. All SW contact information was provided.     SW signed out need for assistance with jury duty and dmv to SW.    Assessment:     [name] appeared to be very involved and supportive of Pts care.      Plan:     SW will assist with jury duty excuse and assistance with a temporary disability parking placard application.      Lyn Hollingshead, OSW-C  Hematology/Oncology Clinical Social Worker  [phone]  v. [phone]

## 2021-12-10 NOTE — Procedures (Signed)
PROCEDURE NOTE    Ariel Braun is a 61 y.o. female patient.    MRN: [id number]    No diagnosis found.  Past Medical History:   Diagnosis Date    Acid reflux disease     Anxiety     Depression     Hereditary angioedema (CMS code)     Multiple myeloma, without mention of having achieved remission     IgG kappa    Myeloma (CMS code) 05/10/2011     Blood pressure 90/65, pulse 73, temperature 37.3 C (99.1 F), temperature source Oral, resp. rate 18, height 153 cm (5' 0.24"), weight 79.6 kg (175 lb 7.8 oz), SpO2 97 %.        Insert PICC line Double lumen (power PICC)    Date/Time: 12/10/2021 3:33 PM  Performed by: Dossie Arbour, RN  Authorized by: Zena Amos, DO     Consent Process:     Emergent situation preventing consent process:  No    Discussion with patient included the following (comment on exceptions):  Diagnosis (the reason for which the procedure is being proposed) and proposed treatment or procedure, Risks, benefits, side effects, likelihood of success of the proposed treatment, anticipated recuperation, and the alternative options and Patient's questions related to the procedure were addressedIndications: New indication for central line (specify)  Anesthesia: local infiltration and see MAR for details    Sedation:  Patient sedated: no    Ultrasound guidance: yes  Dynamic, real-time imaging of vessel cannulation performed.Sterile gel and probe cover used in ultrasound-guided central venous catheter insertionPreparation: Chloraprep  Antiseptic: antiseptic used during central venous catheter insertion  Skin prep agent dried: skin prep agent completely dried prior to procedure  Hand hygiene: hand hygiene performed prior to central venous catheter insertion  Sterile barriers: all five maximum sterile barriers used , cap, mask, sterile gown, sterile gloves and sterile drape  Location: left upper arm (left brachial vein)  Site selection rationale: either arm ordered  Patient position: flat  Lot # :  [id number]  Brand: Bard  Catheter type: PICC  Number of Lumens: 2  Catheter size: 5 Fr  Catheter length: 45 cm  External PICC length: 7 cm  Power line: yes    Antimicrobial not impregnated  Line not changed over guidewire.  Number of attempts: 1  Successful placement: yes  Estimated blood loss: <5 mL  Post-procedure: dressing applied,  chlorhexidine patch applied and sutureless securement device  Assessment: placement confirmed by ECG Tip Location Device and blood return through all ports  Instrument Verification: Wire and dilator count verified, all wires and dilators used present and intact after completion of the procedurePatient tolerance: patient tolerated the procedure well with no immediate complications  Comments: Patient has pending blood culture and right IJ central line from outside hospital. Per Dr. Ulyses Southward, ok to proceed with PICC placement, low suspicion of bacteremia and will remove IJ line once PICC is inserted.      Performed by: Pearlean Brownie, RN  Assisted by: Steffanie Dunn, RN  12/10/2021

## 2021-12-10 NOTE — Consults (Addendum)
Hepatology/Liver Transplant Consultation Note     Consult question: Elevated LFTs    HISTORY OF PRESENT ILLNESS:  Ariel Braun is a 61 y.o. 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 Hepatology was consulted for work-up of elevated liver tests.     She has had elevated ALKP since 05/2021.  Her ALKP and bilirubin rose to 278 and 1.5, respectively on 10/13/21. During her hospitalization at [facility] for hypovolemic shock in the setting of diarrhea from norovirus, her liver tests showed an elevated ALKP in the 1000s (GGT was in the 1000s at the time as well), AST in the 140s-160s, and ALT in the 120s-180s. Her bilirubin had steadily rose to the 10s. Today, her ALT is 116, AST is 101, ALKP has improved to 745, and Tbili is now 14.6. She has had known osseous metastases since 2019, which worsened on CTAP from 12/02/21, now involving lumbar spine and pelvis.     At [facility], she was treated with zosyn (1/18-1/22, resumed 1/24-1/25), CTX (1/18), and vancomycin (1/18-1/19). She was also on prophylactic acyclovir. She was on phenylephrine (1/19-1/20) for hypovolemic vs. septic shock, now resolved. Blood cultures and UA/urine culture were negative from 1/18. Repeat blood culture from 1/24 was also negative. Stool was positive for norovirus in 09/2021 and 11/2021.     Her work-up done at [facility] was also negative for hepatitis A (IgM neg), hepatitis C (Ab neg), and hepatitis B (HBsAg neg). She had a low ASMA and IgG. She has signs of hepatomegaly and signs of fatty liver on RUS U/S. She also had an MRCP that was equivocal for  acute cholecystitis, though HIDA scan was negative. She does not have choledocho or biliary obstruction on imaging. Her TTE also did not show signs of heart failure would lead to hepatic congestion.     Patient is ill-appearing, only AOx2. She denies abdominal pain and n/v. She doesn't quite understand the situation. Per chart review, there is no history of long-term alcohol use or new medications/supplements.    PAST MEDICAL AND SURGICAL HISTORY:  Past Medical History:   Diagnosis Date    Acid reflux disease     Anxiety     Depression     Hereditary angioedema (CMS code)     Multiple myeloma, without mention of having achieved remission     IgG kappa    Myeloma (CMS code) 05/10/2011       Past Surgical History:   Procedure Laterality Date    AUTOLOGOUS STEM CELL TRANSPLANTATION  05/12/11    BREAST CYST EXCISION  1981       ALLERGIES:  Allergies/Contraindications   Allergen Reactions    Caffeine Other (See Comments)     jitters       SOCIAL HISTORY:  Social History     Socioeconomic History    Marital status: Married   Tobacco Use    Smoking status: Unknown   Substance and Sexual Activity    Sexual activity: Not Currently       FAMILY HISTORY:  - Mother - T2DM, HTN  - Father - colon cancer  - Brother -  HTN     MEDICATIONS:  Current Facility-Administered Medications   Medication Dose Route Frequency Provider Last Rate Last Admin    0.9 % sodium chloride infusion  75 mL/hr Intravenous Continuous Carmine Savoy, MD 75 mL/hr at 12/10/21 0209 75 mL/hr at 12/10/21 0209    0.9% sodium chloride flush injection syringe 10-20 mL  10-20 mL Intravenous Q12H [facility] Zena Amos, DO        0.9% sodium chloride flush injection syringe 10-20 mL  10-20 mL Intravenous PRN Zena Amos, DO        0.9% sodium chloride flush injection syringe 3 mL  3 mL Intravenous Q12H [facility] Anousheh Afjei, MD        0.9% sodium chloride flush injection syringe 3 mL  3 mL Intravenous PRN Carmine Savoy, MD        0.9%  sodium chloride flush injection syringe 30 mL  30 mL Intravenous Once PRN Zena Amos, DO        acyclovir (ZOVIRAX) 400 mg in dextrose 5% 100 mL IVPB  250 mg/m2 (Adjusted) Intravenous Q12H (RESP) Zena Amos, DO        lidocaine (PF) (XYLOCAINE) 10 mg/mL (1 %) injection 5 mL  5 mL Intradermal Once PRN Zena Amos, DO        piperacillin-tazobactam (ZOSYN) 4.5 g in sodium chloride 0.9 % 100 mL IVPB (Minibag Plus) EXTENDED infusion  4.5 g Intravenous Q8H Carmine Savoy, MD   Ended at 12/10/21 0731    polyethylene glycol (MIRALAX) packet 17 g  17 g Oral Daily PRN Carmine Savoy, MD        senna (SENOKOT) tablet 17.2 mg  17.2 mg Oral Daily At Bedtime [facility] Carmine Savoy, MD         Current Facility-Administered Medications on File Prior to Encounter   Medication Dose Route Frequency Provider Last Rate Last Admin    [DISCONTINUED] 0.9% sodium chloride flush injection syringe  3 mL Intravenous  Generic External Data Provider        [DISCONTINUED] 0.9% sodium chloride flush injection syringe  3 mL Intravenous Daily PRN Generic External Data Provider        [DISCONTINUED] dextrose 5 % in lactated ringers infusion  50 mL/hr Intravenous  Generic External Data Provider        [DISCONTINUED] GENERIC EXTERNAL MEDICATION     Generic External Data Provider        [DISCONTINUED] GENERIC EXTERNAL MEDICATION  200 mg Intravenous Q12H Generic External Data Provider        [DISCONTINUED] GENERIC EXTERNAL MEDICATION     Generic External Data Provider         Current Outpatient Medications on File Prior to Encounter   Medication Sig Dispense Refill    acyclovir (ZOVIRAX) 400 mg tablet Take 1 tablet (400 mg total) by mouth Twice a day 180 tablet 3    amoxicillin (AMOXIL) 500 mg capsule Take 4 capsules (2,000 mg total) by mouth daily as needed (1 hour prior to dental procedures) (Patient not taking: Reported on 09/22/2021) 4 capsule 3    apixaban (ELIQUIS) 5 mg tablet Take 5 mg by mouth 2 (two)  times daily      cholestyramine light (CHOLESTYRAMINE LIGHT) 4 gram packet Take 1 packet (4 g total) by mouth Twice a day (Patient not taking: Reported on 09/22/2021) 180 packet 3    dexAMETHasone (DECADRON) 4 mg tablet TAKE 2 TABLETS (8 MG TOTAL) BY MOUTH EVERY 7 DAYS. (Patient not taking:  Reported on 11/03/2021) 24 tablet 3    dicyclomine (BENTYL) 10 mg capsule Take 10 mg by mouth 3 (three) times daily as needed (stomach pain)      ecallantide 10 mg/mL (1 mL) SOLN Inject into the skin once as needed. (Patient not taking: Reported on 11/03/2021)      EPINEPHrine (EPIPEN) 0.3 mg/0.3 mL (1:1,000) injection Inject 0.3 mg into the muscle once as needed. Use as instructed (Patient not taking: Reported on 10/14/2020)      hydrOXYzine (ATARAX) 25 mg tablet Take 0.5-1 tablets (12.5-25 mg total) by mouth every 8 (eight) hours as needed for Anxiety (Patient not taking: Reported on 11/03/2021) 30 tablet 1    icatibant (FIRAZYR) 30 mg/3 mL injection Inject into the skin once as needed. (Patient not taking: Reported on 11/03/2021)      LORazepam (ATIVAN) 1 mg tablet Take 1 mg by mouth every 6 (six) hours as needed (Patient not taking: Reported on 11/03/2021)      metoclopramide HCl (REGLAN) 10 mg tablet Take 10 mg by mouth once as needed. (Patient not taking: Reported on 10/14/2020)      multivitamin tablet Take 1 tablet by mouth Daily.  (Patient not taking: Reported on 09/22/2021)      ondansetron (ZOFRAN) 8 mg tablet Take 1 tablet (8 mg total) by mouth every 8 (eight) hours as needed. (Patient not taking: Reported on 10/14/2020) 30 tablet 2    pomalidomide 2 mg CAP Take 2 mg by mouth daily 21/28 days   (Patient not taking: Reported on 11/03/2021)      predniSONE (DELTASONE) 20 mg tablet Take 20 mg by mouth once as needed. (Patient not taking: Reported on 10/14/2020)         REVIEW OF SYSTEMS:  Review of Systems   Constitutional: Negative for chills and fever.   Respiratory: Negative for shortness of breath.     Cardiovascular: Negative for chest pain.   Gastrointestinal: Negative for abdominal pain.       PHYSICAL EXAMINATION:  BP (!) 136/98 (BP Location: Right upper arm, Patient Position: Lying)   Pulse 69   Temp 36.8 C (98.2 F) (Oral)   Resp 18   Ht 153 cm (5' 0.24")   Wt 79.6 kg (175 lb 7.8 oz)   LMP  (LMP Unknown)   SpO2 94%   BMI 34.00 kg/m     General: chronically ill appearing, pleasant, NAD  HEENT: PERRL, MMM, icteric sclera  Respiratory: breathing comfortably, symmetric chest expansion, CTAB  Cardiovascular: RRR, normal S1, S2, no M/R/G  GI: soft, normoactive bowel sounds, non tender, non distended, no HSM  Extremities: 2+ pitting edema in lower extremities  Neuro: A&O x 2, no asterixis  Psych: normal affect    LABORATORY DATA:  Recent Labs     12/10/21  0208   WBC 2.5*   HGB 10.6*   HCT 31.7*   PLT 63*   NA 141   K 3.4*   CL 110   CO2 25   BUN 16   CREAT 0.53*   GLU 87   CA 7.7*   MG 1.8   PO4 1.8*   PT 16.9*   INR 1.4*   PTT 43.1*   AST 101*   ALT 116*   ALKP 745*   TBILI 14.6*   ALB 2.0*       CBC        12/10/21  0208   WBC 2.5*   HGB 10.6*   HCT 31.7*  PLT 63*     Coags        12/10/21  0208   PTT 43.1*   INR 1.4*     Chem7        12/10/21  0208   NA 141   K 3.4*   CL 110   CO2 25   BUN 16   CREAT 0.53*   GLU 87     Electrolytes        12/10/21  0208   CA 7.7*   MG 1.8   PO4 1.8*     Liver Panel        12/10/21  0208   AST 101*   ALT 116*   ALKP 745*   TBILI 14.6*   TP 3.2*   ALB 2.0*     Amylase/Lipase  No results found in last 36 hours    Lactate        12/10/21  0208   LD 493*     Blood Gas  No results found in last 36 hours    BNP  No results found in last 36 hours    Troponin  No results found in last 36 hours      Lipid Panel  No results found in last 7 days    Hemoglobin A1c  No results found in last 7 days    TSH        12/10/21  0208   TSH 2.65     HIV  No results found in last 7 days       CRP  No results found in last 30 days  UA        12/10/21  0004   GUA NEG   BIUA Small*   KEUA  NEG   SGUA 1.037*   HBUA Large*   PHUA 6.0   PRUA 30*   NIUA NEG   WEUA POS*     Microscopy:        12/10/21  0004   WCU >50*   RCU >50*   SQEU >10       MELD-Na score: 20 at 12/10/2021  2:08 AM  MELD score: 20 at 12/10/2021  2:08 AM  Calculated from:  Serum Creatinine: 0.53 mg/dL (Using min of 1 mg/dL) at 12/10/2021  2:08 AM  Serum Sodium: 141 mmol/L (Using max of 137 mmol/L) at 12/10/2021  2:08 AM  Total Bilirubin: 14.6 mg/dL at 12/10/2021  2:08 AM  INR(ratio): 1.4 at 12/10/2021  2:08 AM  Age: 67 years    Microbiology results - 7 Days   Microbiology Results   Date Collected Specimen Source Result   12/10/2021 12:04 AM COVID-19 RNA, RT-PCR/Nucleic Acid Amplification RETEST (Asymptomatic and No Specific COVID-19 Suspicion); No; No; No; No Procedure Planned; Unknown Bilateral Anterior Nares +/- OP Swab  Covid-19 Rna (NWG95) Not detected   Comments (AOZ30) See Comment         IMAGING:    MRCP 12/08/21:   1. Cholelithiasis with mild gallbladder wall edema. Findings are disfavored to represent acute cholecystitis. Gallbladder wall thickening can be seen in the setting of underlying liver disease or cardiac disease.   2. No choledocholithiasis or biliaryobstruction.   3. Marked hepatic steatosis.   4. Moderate bilateral pleural effusions and adjacent atelectasis.    [place] 12/08/21:   Impressions:   1. No interval change without acute intracranial abnormalities.   2. Mild senescent changes are noted without obvious acute infarct. No hemorrhage, mass or midline shift. No findingsto  suggest cerebral edema.    NM Hepatobiliary 12/09/21:   1. Normal radiotracer filling of the gallbladder is identified, therefore the exam is not consistent with cystic duct obstruction or acute cholecystitis.   2. Poor radiotracer uptake and diminished radiotracer excretion by the liver compatible with sequela of hepatocellular dysfunction.    RUQ U/S 12/03/21:   FINDINGS: Liver is mildly enlarged measuring 17.4 cm. There was diffuse increased  echogenicity without focal lesions. Liver capsule was smooth. No dilated intra or extrahepatic ducts, common bile duct measured 4 mm. The gallbladder contained a large shadowing stone measuring 2 cm. There were 2 gallbladder polyps measuring 6 mm and 3 mm. No gallbladder wall thickening or tenderness. Right kidney measured 11.4 cm. Normal echogenicity and no hydronephrosis. Previously seen cyst on the lower pole the right kidney was not visualized.   IMPRESSION:   1. Liver is enlarged with diffuse increased echogenicity. Finding is nonspecific but consistent with fatty infiltration.   2. Large 2 cm stone in the gallbladder, no sonographic evidence of acutecholecystitis.   3. Two gallbladder polyps measuring 6 mm and 3 mm.    TTE 12/03/21:   Conclusions:   The left ventricular cavity size is normal.   Normal LV wall thickness.   The left ventricular ejection fraction is in normal range at 68% by biplane MOD.   No LV regional wall motion abnormalities.   Normal diastolic function for age.       ENDOSCOPY:  No results found for this or any previous visit.  No results found for this or any previous visit.    PATHOLOGY: N/A    ASSESSMENT AND RECOMMENDATIONS  Ariel Braun is a 61 y.o. 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 Hepatology was consulted for work-up of elevated liver tests.      She started daratumumab, pomylast, and dexamethasone weekly in 2021. 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 Her ALKP and bilirubin rose to 278 and 1.5,  respectively on 10/13/21. During her hospitalization at [facility] for hypovolemic shock in the setting of diarrhea from norovirus, her liver tests showed an elevated ALKP in the 1000s (GGT was in the 1000s at the time as well), AST in the 140s-160s, and ALT in the 120s-180s. Her bilirubin had also steadily rose to the 10s. Today, her ALT is 116, AST is 101, ALKP has improved to 745, and Tbili is now 14.6.     Overall, the pattern of her liver tests is mixed, though primarily cholestatic. C/f vanishing duct syndrome or infiltrative disease, especially amyloid in the setting of MM. She has signs of hepatomegaly on RUQ [place] that was done at [facility], which also showed fatty liver. Other ddx for her elevated liver tests are cholestasis of sepsis, DILI (antibiotics received at [facility] or chemotherapy, though has been on the same regimen since 2021), viral hepatitis, PBC, autoimmune cholangiopathy, or PSC. Of note, there are also case reports of norovirus causing elevated transaminases as well.     Her work-up done at [facility] was negative  for hepatitis A (IgM neg, no IgG sent), hepatitis C (Ab neg 12/06/21), and hepatitis B (HBsAg neg from 122/23). She also had a low ASMA and IgG, making autoimmune hepatitis less likely. She has signs of hepatomegaly on RUQ [place], which also showed fatty liver. She also had an MRCP that was equivocal for acute cholecystitis, though HIDA scan was negative (suspect she has reactive gallbladder edema). She does not have choledocholithiasis or biliary obstruction on imaging. Her TTE also did not show signs of heart failure would lead to hepatic congestion. Given her age,she isless likely to haveWilson's (tends to have 4:1 bilirubin to ALKP ratio) or A1AT.    She has mild coagulopathy with an INR of 1.4 and new thrombocytopenia, which could be from acute illness, medication effect, vs. liver disease, though it's unclear if she actually has portal HTN. I suspect that her encephalopathy is  multifactorial; with an ammonia of 25, our suspicion for hepatic encephalopathy is low.     Recommendations:   - Please send additional studies:   Ferritin and iron studies              HEV IgG/IgM              PCR for VZV, HSV, CMV, EBV              AMA, IgG4   Hepatitis A IgG   Hepatitis B core Ab, surface Ab  - Complete abdominal U/S with doppler   - Recommend obtaining transjugular liver biopsy with portal pressure measurements with IR   - Review medications with pharmacy (i.e. cephalopsporins, limit tylenol)   - Daily LFTs and INR     This patient was discussed with the [place] service attending, Dr. Abel Presto.    Thank you for this consult, we will continue to follow with you.  Please don't hesitate to page [phone] with any questions or changes in the patient's clinical status.

## 2021-12-10 NOTE — Progress Notes (Signed)
[facility] PROGRESS NOTE  ATTENDING ONLY     My date of service is 12/10/2021.    24 Hour Course  -No acute events overnight.  Arrived on floor with R IJ CVC in place (placed ~1/19).  Will DC once PICC placed today  -Tbili 14.6 this AM, with marked scleral icterus.  Will discuss liver bx with hepatology, alk phos doubled in November and now in 700's, c/f infiltrative dz  -Fibrinogen 109 this AM, will give 10u cryo  -SLP eval today, recs appreciated    Subjective  Patient seen and examined at bedside this AM.  In NAD.  Pleasant but confused, encephalopathic, roughly AAOX1-2 (knew name, month/year, but thought we were at [facility]).  Endorses nausea but otherwise denying further diarrhea, fevers/chills, or generalized pain.        Vitals  Temp:  [36.3 C (97.3 F)-37.4 C (99.3 F)] 37.3 C (99.1 F)  Heart Rate:  [69-85] 73  *Resp:  [16-18] 18  BP: (90-136)/(57-98) 90/65  SpO2:  [93 %-97 %] 97 %    MostRecent Weight: 79.6 kg (175 lb 7.8 oz)  Admission Weight: 79.6 kg (175 lb 7.8 oz)      Intake/Output Summary (Last 24 hours) at 12/10/2021 1545  Last data filed at 12/10/2021 1314  Gross per 24 hour   Intake 553 ml   Output 605 ml   Net -52 ml       Pain Score:      Physical Exam  Appearance: She is ill-appearing.      Comments: Alert and oriented X 1-2, slow to respond. Confused.    HENT:      Head: Normocephalic and atraumatic.      Nose: Nose normal.   Neck:  R IJ CVC with dressing in place, c/d/i  Eyes:      General: Scleral icterus present.      Extraocular Movements: Extraocular movements intact.      Conjunctiva/sclera: Conjunctivae normal.      Pupils: Pupils are equal, round, and reactive to light.   Cardiovascular:      Rate and Rhythm: Normal rate and regular rhythm.      Pulses: Normal pulses.      Heart sounds: Normal heart sounds. No murmur heard.    No friction rub. No gallop.   Pulmonary:      Effort: Pulmonary effort is normal. No respiratory distress.      Breath sounds: Normal  breath sounds. No stridor. No wheezing, rhonchi or rales.   Abdominal:      General: Abdomen is flat. Bowel sounds are normal. There is no distension.      Palpations: Abdomen is soft. There is no mass.      Tenderness: There is no abdominal tenderness. There is no guarding or rebound.      Hernia: No hernia is present.      Comments: Foley catheter in place, urine appears dark, scant and purulent.   No suprapubic tenderness.   Musculoskeletal:         General: Normal range of motion.      Cervical back: Normal range of motion and neck supple.      Right lower leg: Edema present.      Left lower leg: Edema present.      Comments: Strength 1/5 in the bilateral lower extremities, unable to raise them to gravity.   Pitting edema 3-4+ in the bilateral lower extremities.   Anasarca in the abdomen and thighs bilaterally.  Skin:     General: Skin is warm.      Capillary Refill: Capillary refill takes less than 2 seconds.      Coloration: Skin is jaundiced.      Findings: Bruising present.      Comments: ecchymoses on the sternum   Neurological:      Mental Status: She is alert. She is disoriented.      Motor: Weakness present.       Scheduled Meds:   0.9% sodium chloride flush  10-20 mL Intravenous Q12H [place]    0.9% sodium chloride flush  3 mL Intravenous Q12H [place]    acyclovir  250 mg/m2 (Adjusted) Intravenous Q12H (RESP)    piperacillin-tazobactam  4.5 g Intravenous Q8H    senna  17.2 mg Oral Daily At Bedtime [facility]     Continuous Infusions:   sodium chloride 75 mL/hr (12/10/21 1259)     PRN Meds:   0.9% sodium chloride flush  10-20 mL Intravenous PRN    0.9% sodium chloride flush  3 mL Intravenous PRN    ondansetron  8 mg Intravenous Q8H PRN    polyethylene glycol  17 g Oral Daily PRN       Data    CBC        12/10/21  0208   WBC 2.5*   HGB 10.6*   HCT 31.7*   PLT 63*     Coags        12/10/21  0208   PTT 43.1*   INR 1.4*     Chem7        12/10/21  0208   NA 141   K 3.4*   CL 110   CO2 25   BUN 16   CREAT 0.53*    GLU 87     Electrolytes        12/10/21  0208   CA 7.7*   MG 1.8   PO4 1.8*     Liver Panel        12/10/21  0208   AST 101*   ALT 116*   ALKP 745*   TBILI 14.6*   TP 3.2*   ALB 2.0*     Lactate        12/10/21  0208   LD 493*       Microbiology Results (last 24 hours)     Procedure Component Value Units Date/Time    MRSA Culture [[id number]] Collected: 12/10/21 0004    Order Status: Sent Specimen: Anterior Nares Swab Updated: 12/10/21 1009    Urine Culture [[id number]] Collected: 12/10/21 0004    Order Status: Sent Specimen: Not Applicable from Urine, Midstream Updated: 12/10/21 0742    COVID-19 RNA, RT-PCR/Nucleic Acid Amplification RETEST (Asymptomatic and No Specific COVID-19 Suspicion); No; No; No; No Procedure Planned; Unknown [[id number]] Collected: 12/10/21 0004    Order Status: Completed Specimen: Not Applicable from Bilateral Anterior Nares +/- OP Swab Updated: 12/10/21 0630     COVID-19 RNA, RT-PCR/Nucleic Acid Amplification Not detected     Comment: (Reported by Lab to Public Health.)  (Results reported to [facility])          Comments See Comment     Comment: Bilateral Anterior nares and OP Swab  in universal transport medium         Peripheral Blood Culture [[id number]] Collected: 12/10/21 0209    Order Status: Sent Specimen: Not Applicable from Peripheral Blood Updated: 12/10/21 0540    Peripheral Blood Culture [[id number]] Collected: 12/10/21 0211  Order Status: Sent Specimen: Not Applicable from Peripheral Blood Updated: 12/10/21 0539    Central Blood Culture [[id number]] Collected: 12/10/21 0221    Order Status: Sent Specimen: Not Applicable from Central Blood     Central Blood Culture [[id number]]     Order Status: Canceled Specimen: Not Applicable from Central Blood           Radiology Results   XR Chest 1 View (AP Portable)    Result Date: 12/10/2021  FINDINGS/IMPRESSION: Right internal jugular venous catheter with tip overlying the low right atrium. Low lung volumes with retrocardiac opacification  which may reflect combination of small left pleural effusion with underlying atelectasis or consolidation. No pneumothorax. Unchanged cardiac and mediastinal contours. Report dictated by: Vena Austria, MD, signed by: Vena Austria, MD Department of Radiology and [place]    61 year old 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 The patient has received VRd X 4 (achieving VGPR) then Mel 200 ASCT 05/12/11. 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 She met the criteria for PD on 02/19/2020 and was started on third line DARA PD in June 2021, which was recently held on 10/20/21 due to chronic diarrhea. Patient was transferred OSH after recent hypovolemic/distributive shock. Here for acute encephalopathy workup and acute liver failure workup.      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   June 2020 due to progression of M spike Revlimid was increased to 15 mg PO D1-21 every 28 days, plus dexamethasone  July 2021: started daratumumab, Pomylast 2 mg D 1-21, Q28 days, dexamethasone 12 mg PO Q weekly.   Primary oncologist: Dr. Iona Coach,  Dr. Kennon Rounds [facility] hematology  Receiving dexamethasone 12 mg weekly for diarrhea  Receiving MT with daratumumab with pomalyst and dex, most recent dose 10/16/21, has since been on hold due to worsening diarrhea.     DATA:   09/07/2010: IgG Kappa MM  12/02/2021: Ig M <5, IgG 447, IgA 20, Ig E <2, SFLC Kappa 16.6, Lambda 1.6, Kappa/Lamda 10.38    Chemo:   Holding Daratumumab and Pomalyst, last dose 10/20/21, given recent infection and acute liver failure.    Supportive Care:   Transfusion: Keep Hg >7, and Platelets >10.     Per daughter, has autoantibodies in blood was a difficult match. Would like  to be contacted prior to transfusion.     Line: Will get PICC today, plan to dc R IJ CVC afterwards    Outpatient Oncologist: Dr. Kennon Rounds    Dispo: TBD, after resolution of sepsis and liver failure      # Immunocompromise  # At risk for infections due to malignancy  # Shock, likely hypovolemic vs distributive in the setting of chronic diarrhea, + norovirus infection   # Acute on chronic diarrhea, norovirus positive., diff and OP negative.   -No prior evidence of adrenal insufficiency, AM cortisol levels normal, previously on dexamethasone 8 mg PO Qweekly, but has been held since early December.   -Continue IV Zosyn 4.5 g Q6hrs  -Monitor I/O's      OI ppx: Acyclovir IV    # Grade III neutropenia, at baseline  # Leukopenia  #Thrombocytopenia (possibly  2/2 liver disease)  Data: WBC of 2.2 on 12/05/21, on Zarxio at home  Plan: Continue to monitor    # Acute liver failure of unclear etiology  # Acute on chronic encephalopathy likely 2/2 infectious vs hepatic vs metabolic  # Hyperbilirubinemia  # Anasarca  # Hypoalbuminemia    Data:   -Steady rise in LFTs in 05/2021,  -12/02/21: AST 152--> 121, ALT 130--> 110, TB 5.4, Albumin 1.1  -Hepatitis panel negative.   -1/26:  Tbili 14.6, GGT 1160, ammonia 25  -CTAP 12/02/21: Scattered sclerotic lesions in the lumbar spine, hepatic steatosis.   -RUQ ultrasound: liver enlarged with diffuse increased echogenicity, consistent with fatty infiltration. 2cm stone in the gallbladder.   -HIDA scan: Normal radiotracer filling of the gallbladder is identified, therefore the exam is not consistent with cystic duct obstruction or acute cholecystitis.    Plan:   []  Hepatology consult, follow up recs.  May need liver bx      # History of PE, diagnosed in April 2019,   - hold home apixiban for now given recent anemia and concerns for bleeding.    -monitor for signs and symptoms of worsening bleeding.     # Elevated TSH at OSH, likely 2/2 subclinical hypothyroidism  -repeat TSH when  acute sickness resolved.     # Anxiety, emotional distress:   -Patient has stressful social situation, intellectually disabled son, and regarding underlying disease process.     VTE PPx:  Contraindicated: Anticipated procedure        Severity of Illness                 During this hospitalization the patient is also being treated for:  - Sepsis (present on admission)    - Malignancy associated fatigue on admission  - Neoplasm/malignancy associated pain  - Hypoalbuminemia (present on admission)  - Fluid status: Hypovolemia on admission     - Cachexia  - Encephalopathy: Metabolic encephalopathy    Code Status: FULL    Zena Amos, DO  12/10/2021

## 2021-12-10 NOTE — Consults (Signed)
PHYSICAL THERAPY CANCELED SESSION NOTE     Cancellation: Cancelled session  PT session missed for the following reasons (S): Patient at Procedure/Imaging  Missed/Cancelled Session Comment: Patient getting PICC placed.  Attempted Therapy Session Time:: [place], [place]  12/10/2021

## 2021-12-10 NOTE — Consults (Addendum)
Clinical Swallow Evaluation       History and Current Status  History  History of present illness: SEE NOTE from Carmine Savoy, MD 1/23 for full hx, but briefly: Ariel Braun 61 y.o. female presents with Multiple myeloma -since 2011, remission status unspecified ([place]), encephalopathy, sepsis with acute hypoxic resp. failure with aterlectasis and pleural fluid, acute liver failure, hypokalemia, norovirus infection, chronic diarrhea, gallstones, and shock. She admitted to [facility] as a transfer from [place] after being admitted d/t loss of conciousness with chronic diarrhea and change in mental status. Pt has complex pHx of MM, mutple PE, sclerotic lesion R ilium/mid right SI jt with SUV 4.4 with suspected osseous metastases, HAE, and GERD. SLP consulted for CSE.   Swallowing history: History of dysphagia  Dysphagia history: Suspected prior aspiration event during hospital course at [facility]. SLP recommended 4-2 diet.   Patient Status  Positioning: Upright  Cognition: Cooperative;Lethargic  Communication:  Functional for expression of basic wants, needs, and thoughts  Respiratory: Room air  Patient status comment(s): Pt recieved sleeping but roused to SLP voice. Cooperative and increased engagement during CSE. Appeared with general weakness and lethargy.       Oral-Peripheral Examination  Oral-Peripheral Examination  General: Non-focal/unremarkable  Lips: Unremarkable/WNL  Mandible: Unremarkable/WNL   Tongue: Unremarkable/WNL  Palate: Elevates normally on phonation  Larynx: Unremarkable/WNL  Oral status: Natural dentition in good repair  Comment(s): Pt able to follow most commands, puff cheeks, tongue control, demonstrated some generalized overall weakness, likely d/t overall status.    Swallow Evaluation  Ice Chips  Administered by: Petra Kuba  Oral Phase: No gross oral phase deficits;Slow/effortful mastication (Mastication appeared slow likely d/t general fatigue)  Pharyngeal Phase: No gross pharyngeal phase  deficits  Level 0 (Thin)  Administered by: Cup;Spoon;Straw  Oral Phase:  No gross oral phase deficits  Pharyngeal Phase: No gross pharyngeal phase deficits  Other: Fatigue (Difficult to hold cup on cup sip, demonstrated overall fatigue)    Level 4 (Puree)  Administered by:  Fed by clinician  Oral Phase: Prolonged oral transit (Pt. endorsed dry mouth, may have prolonged oral transit d/t general fatigue)  Pharyngeal Phase: No gross pharyngeal phase deficits     Regular  Administered by: Fed by clinician  Oral Phase: Prolonged oral transit;Residue on tongue (Pt endorsed dry mouth and dislike of cookie, cleared with liquid wash)  Pharyngeal Phase: No gross pharyngeal phase deficits  Strategy 1:  Liquid wash;Double swallow (Pt independatly used lingual sweep and double swallow to attempt bolus clear, liquid wash cleared oral residue completely)  Results of Strategy 1: Cleared oral residue       Assessment  Assessment  Impressions: SLP consulted for CSE. Patient exhibits mild oral>pharyngeal dysphagia c/b slowed oral transit and prolonged mastication on regular solids, but no overt s/sx aspiration across any trials. Oral difficulty likely d/t prolonged complex med course and general fatigue/ weakness, and dry mouth. Pt demonstrated unremarkable oral motor exam with MILD indications of possible weakness when asked to push tongue into cheek, but may not have understood task d/t general fatigue. Pt demonstrated appropriate acceptance across all PO trials of thins, puree and solids. On thins, Pt accepted 1:1 assist on tsp and straw but performed cup sip independently, straws appeared easier to tollerate d/t fatigue. Complete clearance and clear vocal quality across all trials. On solids, Pt cleared puree appropriately but demonstrated mildly slowed oral transit and reduced mastication on regular solids with ample residue on tongue. Pt initiated mutliple swallows and  lingual sweep to clear oral residue independantly but still  required liquid wash to clear. Liquid wash adequately cleared remaining residue. Given above, recommend soft and bite sized diet with thins to continue oralpharyngeal engagement and nutrition needs. 1:1 assist with feeding, thins via straw or cup as tollerated, medication per patient tollerance and preference. Will continue to follow for dysphagia recs and diet upgrades.  Current Functional Level  Domain: Swallowing  Swallowing: <Level 5>  Mild dysphagia: Distant supervision, may need one diet consistency restricted       Recommendations  Swallowing Recommendations  Solids: Level 6 (Soft and bite-sized)  Solids recommendation comment(s): 1:1 assist, small bites, slow pace,  Liquids: Level 0 (Thin)  Liquids recommendation comment(s): 1:1 assist, small sips  Liquid administration: Spoon;Cup;Straw  Medication administration: Whole with liquid (Per patient preference as tollerated)  Supervision during meals: 1:1 assistance  Mealtime strategies and positioning: Upright;Small bites;Remain upright 30 minutes after eating;Slow pace;Alternate solids and liquids  Meal strategy comment(s): Liquid wash helpful if oral clearance/ residue persists d/t dry mouth.     Plan  Plan  Plan: See listed goals  Frequency  Frequency: 2x/week  Short Term Goals  Short Term Goals:  The patient will safely and efficiently ingest 100% of therapeutic trials to determine appropriate diet, at the discretion of the SLP;The patient/family/caregiver will identify and demonstrate compensatory swallow strategies and/or precautions to improve safe and efficient swallowing with 100% accuracy    Damien Fusi  12/10/2021

## 2021-12-10 NOTE — Other (Signed)
DOCUMENTATION OF INFORMED CONSENT:MN     Informed Consent discussion conducted prior to start of procedure: The patient and/or the patient's medical decision maker was oriented to person, place, time and situation. Catheter insertion procedure with local anesthesia and risks, benefits, and alternative to the procedure were discussed and understood with the patient and/or the patient's medical decision-maker. This discussion included, but was not limited to: bleeding, pain, infection, and vein thrombosis. The patient and/or the patient's medical decision maker understands, has had all of his/her questions answered, and desires to proceed. Informed consent obtained.

## 2021-12-11 LAB — PREPARE CRYOPRECIPITATE
Blood Product Expiration Date: 202301261647
Blood Product Expiration Date: 202301261647
Blood Product Issue Date/Time: 202301261222
Blood Product Issue Date/Time: 202301261222
Cryoprecipitate - Units Ready: 2
UDIV: 0
UDIV: 0
Unit Type and Rh: 7300
Unit Type and Rh: 7300
Unit Type and Rh: POSITIVE
Unit Type and Rh: POSITIVE

## 2021-12-11 LAB — COMPREHENSIVE METABOLIC PANEL (BMP, AST, ALT, T.BILI, ALKP, TP ALB)
AST: 106 U/L — ABNORMAL HIGH (ref 5–44)
AST: 116 U/L — ABNORMAL HIGH (ref 5–44)
Alanine transaminase: 94 U/L — ABNORMAL HIGH (ref 10–61)
Alanine transaminase: 99 U/L — ABNORMAL HIGH (ref 10–61)
Albumin, Serum / Plasma: 1.8 g/dL — ABNORMAL LOW (ref 3.4–4.8)
Albumin, Serum / Plasma: 1.9 g/dL — ABNORMAL LOW (ref 3.4–4.8)
Alkaline Phosphatase: 584 U/L — ABNORMAL HIGH (ref 38–108)
Alkaline Phosphatase: 627 U/L — ABNORMAL HIGH (ref 38–108)
Anion Gap: 7 (ref 4–14)
Anion Gap: 4 (ref 4–14)
Bilirubin, Total: 13.3 mg/dL — ABNORMAL HIGH (ref 0.2–1.2)
Bilirubin, Total: 15 mg/dL — ABNORMAL HIGH (ref 0.2–1.2)
Calcium, total, Serum / Plasma: 7.2 mg/dL — ABNORMAL LOW (ref 8.4–10.5)
Calcium, total, Serum / Plasma: 7.4 mg/dL — ABNORMAL LOW (ref 8.4–10.5)
Carbon Dioxide, Total: 22 mmol/L (ref 22–29)
Carbon Dioxide, Total: 23 mmol/L (ref 22–29)
Chloride, Serum / Plasma: 113 mmol/L — ABNORMAL HIGH (ref 101–110)
Chloride, Serum / Plasma: 113 mmol/L — ABNORMAL HIGH (ref 101–110)
Creatinine: 0.39 mg/dL — ABNORMAL LOW (ref 0.55–1.02)
Creatinine: 0.4 mg/dL — ABNORMAL LOW (ref 0.55–1.02)
Glucose, non-fasting: 61 mg/dL — ABNORMAL LOW (ref 70–199)
Glucose, non-fasting: 65 mg/dL — ABNORMAL LOW (ref 70–199)
Potassium, Serum / Plasma: 2.7 mmol/L — CL (ref 3.5–5.0)
Potassium, Serum / Plasma: 4.5 mmol/L (ref 3.5–5.0)
Protein, Total, Serum / Plasma: 3.1 g/dL — ABNORMAL LOW (ref 6.3–8.6)
Protein, Total, Serum / Plasma: 3.4 g/dL — ABNORMAL LOW (ref 6.3–8.6)
Sodium, Serum / Plasma: 142 mmol/L (ref 135–145)
Sodium, Serum / Plasma: 140 mmol/L (ref 135–145)
Urea Nitrogen, Serum / Plasma: 12 mg/dL (ref 7–25)
Urea Nitrogen, serum/plasma: 11 mg/dL (ref 7–25)
eGFRcr: 113 mL/min/{1.73_m2} (ref >59)
eGFRcr: 113 mL/min/{1.73_m2} (ref >59)

## 2021-12-11 LAB — COMPLETE BLOOD COUNT WITH DIFFERENTIAL
Abs Basophils: 0 10*9/L (ref 0.00–0.10)
Abs Eosinophils: 0.01 10*9/L (ref 0.00–0.40)
Abs Imm Granulocytes: 0.02 10*9/L (ref <0.10)
Abs Lymphocytes: 0.25 10*9/L — ABNORMAL LOW (ref 1.00–3.40)
Abs Monocytes: 0.09 10*9/L — ABNORMAL LOW (ref 0.20–0.80)
Abs Neutrophils: 1.47 10*9/L — ABNORMAL LOW (ref 1.80–6.80)
Hematocrit: 27.2 % — ABNORMAL LOW (ref 36.0–46.0)
Hemoglobin: 9.1 g/dL — ABNORMAL LOW (ref 12.0–15.5)
MCH: 32.4 pg (ref 26.0–34.0)
MCHC: 33.5 g/dL (ref 31.0–36.0)
MCV: 97 fL (ref 80–100)
MPV: 14 fL — ABNORMAL HIGH (ref 9.1–12.6)
Platelet Count: 48 10*9/L — ABNORMAL LOW (ref 140–450)
RBC Count: 2.81 10*12/L — ABNORMAL LOW (ref 4.00–5.20)
RDW-CV: 21.8 % — ABNORMAL HIGH (ref 11.7–14.4)
WBC Count: 1.8 10*9/L — ABNORMAL LOW (ref 3.4–10.0)

## 2021-12-11 LAB — TYPE AND SCREEN
ABO/RH(D): O POS
Antibody Screen: POSITIVE

## 2021-12-11 LAB — COMPLETE BLOOD COUNT
Hematocrit: 24.1 % — ABNORMAL LOW (ref 36.0–46.0)
Hemoglobin: 8 g/dL — ABNORMAL LOW (ref 12.0–15.5)
MCH: 32.7 pg (ref 26.0–34.0)
MCHC: 33.2 g/dL (ref 31.0–36.0)
MCV: 98 fL (ref 80–100)
MPV: 13.3 fL — ABNORMAL HIGH (ref 9.1–12.6)
Platelet Count: 67 10*9/L — ABNORMAL LOW (ref 140–450)
RBC Count: 2.45 10*12/L — ABNORMAL LOW (ref 4.00–5.20)
RDW-CV: 21.6 % — ABNORMAL HIGH (ref 11.7–14.4)
WBC Count: 1.8 10*9/L — ABNORMAL LOW (ref 3.4–10.0)

## 2021-12-11 LAB — FIBRINOGEN, FUNCTIONAL: Fibrinogen, Functional: 200 mg/dL — ABNORMAL LOW (ref 202–430)

## 2021-12-11 LAB — MAGNESIUM, SERUM / PLASMA: Magnesium, Serum / Plasma: 1.7 mg/dL (ref 1.6–2.6)

## 2021-12-11 LAB — URINE CULTURE: Bacterial Culture, Urine w/o gram stain: >100000 — AB

## 2021-12-11 LAB — ACTIVATED PARTIAL THROMBOPLASTIN TIME: aPTT: 41 s — ABNORMAL HIGH (ref 21.6–30.8)

## 2021-12-11 LAB — PROTHROMBIN TIME
INR: 1.5 — ABNORMAL HIGH (ref 0.9–1.2)
PT: 17.1 s — ABNORMAL HIGH (ref 11.6–15.0)

## 2021-12-11 LAB — MRSA CULTURE

## 2021-12-11 MED ORDER — POTASSIUM CHLORIDE 20 MEQ/50 ML IN STERILE WATER INTRAVENOUS PIGGYBACK
20 | Freq: Once | INTRAVENOUS | Status: AC
Start: 2021-12-11 — End: 2021-12-11
  Administered 2021-12-11: 18:00:00 20 meq via INTRAVENOUS

## 2021-12-11 MED ORDER — POTASSIUM CHLORIDE 20 MEQ/50 ML IN STERILE WATER INTRAVENOUS PIGGYBACK
20 | Freq: Once | INTRAVENOUS | Status: DC
Start: 2021-12-11 — End: 2021-12-11

## 2021-12-11 MED ORDER — FENTANYL (PF) 50 MCG/ML INJECTION SOLUTION
50 | Freq: Once | INTRAMUSCULAR | Status: AC | PRN
Start: 2021-12-11 — End: 2021-12-11
  Administered 2021-12-11: 21:00:00 50 via INTRAVENOUS

## 2021-12-11 MED ORDER — SODIUM CHLORIDE 0.9 % IV BOLUS
0.9 | Freq: Once | INTRAVENOUS | Status: AC
Start: 2021-12-11 — End: 2021-12-11
  Administered 2021-12-11: 08:00:00 via INTRAVENOUS

## 2021-12-11 MED ORDER — METOCLOPRAMIDE 5 MG/ML INJECTION SOLUTION
5 | Freq: Three times a day (TID) | INTRAMUSCULAR | Status: DC | PRN
Start: 2021-12-11 — End: 2021-12-29
  Administered 2021-12-12: 04:00:00 via INTRAVENOUS
  Administered 2021-12-13: 05:00:00 10 mg via INTRAVENOUS
  Administered 2021-12-29: 18:00:00 via INTRAVENOUS

## 2021-12-11 MED ORDER — POTASSIUM CHLORIDE 10 MEQ/100ML IN STERILE WATER INTRAVENOUS PIGGYBACK
10 | Freq: Once | INTRAVENOUS | Status: DC
Start: 2021-12-11 — End: 2021-12-11

## 2021-12-11 MED ORDER — DEXTROSE 50 % IN WATER (D50W) INTRAVENOUS SYRINGE
Freq: Once | INTRAVENOUS | Status: AC
Start: 2021-12-11 — End: 2021-12-11
  Administered 2021-12-12: 25 g via INTRAVENOUS

## 2021-12-11 MED ORDER — IOHEXOL 350 MG IODINE/ML INTRAVENOUS SOLUTION
350 mg iodine/mL | INTRAVENOUS | Status: AC
  Administered 2021-12-11: 21:00:00 via INTRAVENOUS

## 2021-12-11 MED ORDER — LIDOCAINE (PF) 10 MG/ML (1 %) INJECTION SOLUTION
10 | INTRAMUSCULAR | Status: AC
Start: 2021-12-11 — End: 2021-12-11

## 2021-12-11 MED ORDER — FENTANYL (PF) 50 MCG/ML INJECTION SOLUTION
50 | INTRAMUSCULAR | Status: AC
Start: 2021-12-11 — End: 2021-12-11

## 2021-12-11 MED ORDER — MIDAZOLAM 1 MG/ML INJECTION SOLUTION
1 | Freq: Once | INTRAMUSCULAR | Status: AC | PRN
Start: 2021-12-11 — End: 2021-12-11
  Administered 2021-12-11: 21:00:00 1 via INTRAVENOUS

## 2021-12-11 MED ORDER — POTASSIUM CHLORIDE 40 MEQ/100ML IN STERILE WATER INTRAVENOUS PIGGYBACK
40 | Freq: Once | INTRAVENOUS | Status: AC
Start: 2021-12-11 — End: 2021-12-11
  Administered 2021-12-11: 17:00:00 40 meq via INTRAVENOUS

## 2021-12-11 MED ORDER — SODIUM CHLORIDE 0.9 % IV BOLUS
0.9 | Freq: Once | INTRAVENOUS | Status: AC
Start: 2021-12-11 — End: 2021-12-11
  Administered 2021-12-11: 22:00:00 via INTRAVENOUS

## 2021-12-11 MED ORDER — MIDAZOLAM 1 MG/ML INJECTION SOLUTION
1 | INTRAMUSCULAR | Status: AC
Start: 2021-12-11 — End: 2021-12-11

## 2021-12-11 MED ORDER — POTASSIUM CHLORIDE 40 MEQ/100ML IN STERILE WATER INTRAVENOUS PIGGYBACK
40 | Freq: Once | INTRAVENOUS | Status: AC
Start: 2021-12-11 — End: 2021-12-11
  Administered 2021-12-11: 14:00:00 40 meq via INTRAVENOUS

## 2021-12-11 MED FILL — PIPERACILLIN-TAZOBACTAM 4.5 GRAM INTRAVENOUS SOLUTION: 4.5 gram | INTRAVENOUS | Qty: 4500

## 2021-12-11 MED FILL — POTASSIUM CHLORIDE 20 MEQ/50 ML IN STERILE WATER INTRAVENOUS PIGGYBACK: 20 mEq/50 mL | INTRAVENOUS | Qty: 50

## 2021-12-11 MED FILL — FENTANYL (PF) 50 MCG/ML INJECTION SOLUTION: 50 mcg/mL | INTRAMUSCULAR | Qty: 2

## 2021-12-11 MED FILL — ACYCLOVIR SODIUM 50 MG/ML INTRAVENOUS SOLUTION: 50 mg/mL | INTRAVENOUS | Qty: 8

## 2021-12-11 MED FILL — ONDANSETRON HCL (PF) 4 MG/2 ML INJECTION SOLUTION: 4 mg/2 mL | INTRAMUSCULAR | Qty: 4

## 2021-12-11 MED FILL — POTASSIUM CHLORIDE 40 MEQ/100ML IN STERILE WATER INTRAVENOUS PIGGYBACK: 40 mEq/100 mL | INTRAVENOUS | Qty: 100

## 2021-12-11 MED FILL — LIDOCAINE (PF) 10 MG/ML (1 %) INJECTION SOLUTION: 10 mg/mL (1 %) | INTRAMUSCULAR | Qty: 30

## 2021-12-11 MED FILL — MIDAZOLAM 1 MG/ML INJECTION SOLUTION: 1 mg/mL | INTRAMUSCULAR | Qty: 2

## 2021-12-11 NOTE — Consults (Signed)
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 The patient has received VRd X 4 (achieving VGPR) then Mel 200 ASCT 05/12/11. 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 She met the criteria for PD on 02/19/2020 and was started on third line DARA PD in June 2021, which was recently held on 10/20/21 due to chronic diarrhea. Patient was transferred OSH after recent hypovolemic/distributive shock. Here for acute encephalopathy workup and acute liver failure workup.  Assessment: Pt alert and engaged in OT evaluation. Pt oriented to year/month/self however not oriented to day/date/place. Pt following commands during session however globally delayed, impaired processing, decreased memory and immediate recall. Pt able to report city of residence and accurrately report type of home. Pt requires max A for bed mobility, min A for sitting balance. Pt unable to tolerated more than 8  min EOB with max encouragement due to fatigue. Pt p/w stable vitals however globally weak -3/5 LEs and -3/5 UEs. At this time, recommed placement.    Recommendations    Recommended Discharge Disposition Placement for continued therapy       Discharge DME Recommendations Defer to next level of care;To be determined   Equipment Recommendations     DME Comment     Discharge Recommendations Comment     Rehab Potential Patient participates well in therapy and is progressing towards goal   Anticipated Assistance Available at Discharge Family;Spouse/Significant other   Anticipated Type of Assistance Available at Discharge Assist as needed   Anticipated Time of Assistance Available at Discharge     Barriers to Discharge Medical issues;Impaired Cognition;Insufficient activity tolerance   Patient's current functional ability appropriate for D/C recommendations No     Inpatient recommendations  OT Inpatient  Recommendations     Recommendation Comments     Current Maximal Level of Assist Needed Maximal assist     Prior Level of Function  Prior living environment comments:Pt lives in [address] [facility] with daughter in [place]  Available equipment or existing home modifications:no DME  Prior functional limitations:Independent however pt not reliable historian attempted family however unable to confirm. will continue to follow up.  Social supports:daughter  Means to access community:   Occupation(s), roles and routines, exercise habits:   Fall history:   Fall history details:   Additional comments:          Currently in pain  Currently in pain: Yes  Pain location: lower back pain prior, moaning but unable to report or localize pain  Pain scale: Wong-Baker      Brace/Precautions   Brace/Orthotic/Prosthetic:   Precautions:Has weight bearing limitation or precaution: Yes  Precautions and weight bearing status comments: delrium, falls     Subjective Report:"I need to sit in the chair"    Patient/Family Goal:     Objective Findings and Interventions    Areas of Occupation    Grooming and Light Hygiene Minimal assistance   Cueing Required     Comment wash face with wash cloth, min A to complete, deferred oral care, min A with chapstick   Self Feeding Minimal assistance   Cueing Required     Comment aspiration precautions, 1:1 feed see SLP note   Toileting Maximal assistance   Cueing Required     Toileting Clothing Management Moderate assistance   Cueing Required     Comment mod A rolling, max A pericare,  foley +   Upper Body Dressing Maximal assistance   Cueing Required     Comment gown   Lower Body Dressing Maximal assistance   Cueing Required     Comment don socks   Upper Body Bathing Maximal assistance   Cueing Required     Comment     Lower Body Bathing Maximal assistance   Cueing Required     Comment     ADL/IADL Comment         Functional Transfers    Bed Mobility From Supine   Bed Mobility To Sitting EOB   Level of Assist Maximal  assistance   Cueing Required     Transfer From Sit   Transfer To     Level of Assist Contact guard;Minimal assistance   Cueing Required     Technique     Functional Transfer Comment Pt instructed in AROM in supine, demonstrated -3/5 bilateral UEs, 3/5 ankle, -3/5 knee flex/ext. 2/5 hip flexion. Pt p/w increased oral secretions in mouth pointing to cup. Able to spit and max a for yaunkauer. Supine to sit w/ HOB elevated and max A. Pt tolerated sitting EOB for ~8 min with min A to CG. Pt continuing to request return to supine 2/2 fatigue. BP stable EOB. Max A x2 scoot to [facility], Max A back to bed.         Streeter for ADLs  6 Click Score: 12    Client Factors  Communication comment:      Hearing comment:      Cognitive deficits noted in the following area(s):: Orientation, Attention, Memory, Judgement/Safety  Cognition comment:RASS 0, following commands delayed processing. Oriented to year/month/self. Not oriented to day/date/place. Able to state city she resides    Initiation;Organization;Sequencing;Problem ID;Problem solving;Information processing;Safety awareness  Executive functions comment:      Musculoskeletal impairments noted in:RUE;LUE;LLE;RLE                                                             OT Education  Education  Learner: Patient;Caregiver  Content: Plan of care;Activity recommendations  Response: Verbalizes understanding  Communication between other health care provider: PT;RN    OT Plan of Care  Planned OT Interventions: Activities of Daily Living retraining;Patient/Family/Caregiver education;Upper extremity range of motion/coordination training;Assistive/Adaptive device training;Cognitive re-training;Functional transfer training;Neuromuscular re-education;Pain Management;Therapeutic exercise training;Instrumental ADL retraining  OT Prognosis for Goals: Good  OT Frequency: 5x/wk  OT Duration: 4;Weeks       OT Goals  Short Term Goal End Date: 01/11/22       Toileting  Pt will  complete toileting activities: With minimal assistance  Toileting Comment: FWW  Upper Body Dressing  Pt will dress upper body: With minimal assistance  Upper Body Dressing Comment: FWW  Lower Body Dressing  Pt will dress lower body: With minimal assistance  Lower Body Dressing Comment: FWW  Upper Body Bathing  Pt will bathe upper body: With minimal assistance  Upper Body Bathing Comment: shower chair  Lower Body Bathing  Pt will bathe lower body: With minimal assistance  Lower Body Bathing Comment: shower chair  Bed Mobility for ADLs  Bed Mobility for ADLs: With supervision  Functional Transfers  Chair Transfer: With minimal assistance  Chair Transfer Comment: FWW  Shower Transfer: With minimal assistance  Shower Transfer Comment: FWW  Toilet Transfer: With Programmer, systems Comment: [place], OT  12/11/2021 12:01 PM

## 2021-12-11 NOTE — Procedures (Signed)
PRE-PROCEDURE DIAGNOSIS:  Abnormal LFTs    POST-PROCEDURE DIAGNOSIS: Same; successful transjugular liver biopsy with measurement of venous pressures.    PROCEDURES PERFORMED: Cannulation of the hepatic veins and transjugular liver biopsy.  Measurement of venous pressures.    ANESTHESIA: Lidocaine 1% 10 cc sq at catheter entry site. Moderate sedation pe rnursing sedation record    OTHER MEDICATIONS: None    FINDINGS: Successful transjugular liver biopsy and measurement of venous pressures. Full report to follow.     COMPLICATIONS: No immediate    SPECIMENS: 3 x 19G core biopsy samples    ESTIMATED BLOOD LOSS: 10 ml

## 2021-12-11 NOTE — Interdisciplinary (Addendum)
SOCIAL WORK NOTE    Data:     Pt is 61 y.o., with MM, admitted for diarrhea, hypotension and new acute liver failure, per H&P. Please see inpatient chart for full medical history and details.  SW followed up from yesterday's consultation to assist with [facility] application and jury duty letter.     SW introduced herself and her role to Pt. SW inquired about how Pt is coping. Pt indicated feeling "fine". Pt consented to include information in letter excusing her from jury duty. SW provided reflective listening to Pt as she attempted to share her feeling "tired" about "everything". She shared difficulty with word finding and unable to clarify how SW could assist her further. BSRN entered room to escort Pt to [place]. SW validated Pt's feelings and provided reassurance.      SW followed up with identified caregiver, [name] reviewed jury duty letter. SW will leave completed letter and DMV application in patient's room this afternoon. Pt's caregiver explored lodging options with SW. She expressed gratitude for support and was encouraged to make contact should needs arise.     SW offered additional support and Pt/daughter declined any further needs. All SW contact information was provided.     ADDENDUM:   SW briefly met pt's daughter and son in room. SW provided [facility] application and further instructions. Letter excusing patient from jury duty also provided.     Assessment:     Pt presented disoriented but communicative. SW explored orientation and she thought she was at OSH. SW and BSRN provided reassurance and support.       Plan:     SW will be available for any further needs.       Enid Baas, MPH   Clinical Social Worker   Inpatient Hematology/ Oncology  [phone]

## 2021-12-11 NOTE — Consults (Signed)
[facility] Team Consultation    Date: 12/11/2021  Patient: Ariel Braun   DOB: 1961-08-19   Age: 61 y.o.  Admit Date: 12/09/2021   Length of Stay: 2 days   Unit: L1105/L1105-01     Wound Care Consult: initial and in person  Reason for Consultation: Consulted by: RN for alteration in skin integrity found on the bilateral buttocks area.       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 The patient has received VRd X 4 (achieving VGPR) then Mel 200 ASCT 05/12/11. 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 She met the criteria for PD on 02/19/2020 and was started on third line DARA PD in June 2021, which was recently held on 10/20/21 due to chronic diarrhea. Patient was transferred OSH after recent hypovolemic/distributive shock. Here for acute encephalopathy workup and acute liver failure workup.  The patient was recently hospitalized at [facility] on 12/02/21 after recommendation of Trish Mage during a video visit, she was complaining of increased weakness, persistent diarrhea. The patient had reporting having chronic diarrhea since her COVID booster in August 2022 and had developed clinical deterioration over the past month.     Wound(s):   Bilateral buttocks moisture and friction damage present at the time of admission          Wound Assessment:  The pt was found with scattered tissue loss on the buttocks bilaterally.  Although it is scattered the skin loss takes the shape of an oval over the fleshy area of the buttocks. Tissue loss is greater on the left side than the right side, the left side found to be full thickness red and yellow in color. Right side with scant tissue loss partial thickness.  Surrounding the open area is intact skin that presents as chronically damaged by friction.  No pain noted  No tissue loss is noted over the coccyx or the sacrum.  The  ulcerations bilaterally are symmetrical in appearance indicating friction issues. The pt remains incontinent of liquid stool, and was incontinent prior to admission, currently has an indwelling catheter in place for urinary management.            Recommendations:  Bilateral buttocks moisture and friction damage:  Clean with no-rinse foam cleanser, pat dry, apply Triad wound paste daily and PRN. DO NOT APPLY FOAM DRESSING OVER TRIAD. Do not attempt to remove all the cream while cleaning the patient.  During skin cleansing gently remove the soiled part of the cream with a wet/moist soft cloth, pat dry, and reapply the cream as needed.  Do not scrub cream off of the skin.  Do not allow the cream to cake on the skin. Do not mix triad wound paste with any other product.         The following techniques are recommended for pressure injury prevention:     1. Individualize proper positioning changes  every 1-2hrs/prn  - Document patient refusal to turn     2. Pressure redistribution and off loading   - May use appropriate devices when needed   - Float heels at all times with the use of  pillows or heel offloading boots (heel offloading boots PMM# [phone])  - Make sure patient is using waffle cushion when out of bed in a chair or gurney, use green waffle cushion for  bariatric patient population (obtain from material services)     3. Avoid rubbing ( friction) and sliding( shear)  - Use pillows to protect bony prominences (Knees, ankles, heels) from making direct contact with each other  - Use ceiling/over-bed lift when ever possible to prevent excessive friction   - Limit the number of layers under the pt to 2 or less whenever possible.     4. Keep the skin clean, dry and free of excess moisture  - Cleanse the skin promptly with every episodes of incontinence  - Protect the skin from exposure to excessive moisture with Critic  Aid - Skin Protectant or skin protectant product as recommended by Wound Care RN     5. Check the  skin frequently (especially high risk areas) for signs of breakdown  - Check under all removable devices at least every 8 hours.   - Document if skin assessment can not be completed because device can not be removed.    6. Maintain good diet   - RD will be consulted for all high risk patient or patients with full thickness wounds  - Inquire about pt food preferences to encourage good intake     7.  Avoid the use of Body Worn Absorbent  Products (BWAP) such as adults briefs   - offer the use of external catheter   - prompt with bed pan every 2hours to prevent incontinence episodes     Current Surface: versa care   Recommended Surface: centrella max   Please place pt on centrella max surface   Limit the number of layers under patient  If possible only use one fitted sheet and disposable chux under the patient.    May use hover mat/sling for repositioning, bed movement and transport      The assigned staff RN and first call provider were notified of assessment findings, wound type, and the projected plan of care.   The wound care consultation is complete and the patient will not be added to the wound care rounding schedule . Wound and ostomy specialist consultation are available by the request of staff. Please re-consult for new wound(s) or deterioration of known wound(s).     Marchelle Folks RN [facility]

## 2021-12-11 NOTE — Consults (Signed)
Hepatology/Liver Transplant Service Consultation Note     24 Hour Course  S/p liver biopsy with hepatic venous pressure gradient (HVPG) of 8.   Abd [place] - hepatomegaly with diffusely increased echogenicity which can be seen with hepatic amyloidosis or steatosis.    Subjective  AO 1-2 this morning, denies pain.     Objective  Current Facility-Administered Medications   Medication Dose Route Frequency Provider Last Rate Last Admin    0.9 % sodium chloride infusion  75 mL/hr Intravenous Continuous Carmine Savoy, MD 75 mL/hr at 12/10/21 1259 75 mL/hr at 12/10/21 1259    0.9% sodium chloride flush injection syringe 10-20 mL  10-20 mL Intravenous Q12H [place] Sandy Karr, DO        0.9% sodium chloride flush injection syringe 10-20 mL  10-20 mL Intravenous PRN Zena Amos, DO        0.9% sodium chloride flush injection syringe 3 mL  3 mL Intravenous Q12H [place] Anousheh Afjei, MD        0.9% sodium chloride flush injection syringe 3 mL  3 mL Intravenous PRN Carmine Savoy, MD        acyclovir (ZOVIRAX) 400 mg in dextrose 5% 100 mL IVPB  250 mg/m2 (Adjusted) Intravenous Q12H (RESP) Zena Amos, DO   Ended at 12/11/21 1051    ondansetron (ZOFRAN) injection 8 mg  8 mg Intravenous Q8H PRN Zena Amos, DO   8 mg at 12/10/21 1737    piperacillin-tazobactam (ZOSYN) 4.5 g in sodium chloride 0.9 % 100 mL IVPB (Minibag Plus) EXTENDED infusion  4.5 g Intravenous Q8H Carmine Savoy, MD 25 mL/hr at 12/11/21 1531 4.5 g at 12/11/21 1531    polyethylene glycol (MIRALAX) packet 17 g  17 g Oral Daily PRN Carmine Savoy, MD        senna (SENOKOT) tablet 17.2 mg  17.2 mg Oral Daily At Bedtime [facility] Carmine Savoy, MD           Vitals  Temp:  [36.3 C (97.3 F)-37.4 C (99.3 F)] 36.3 C (97.3 F)  Heart Rate:  [64-80] 68  *Resp:  [15-53] 22  BP: (80-105)/(57-89) 105/71  General: well appearing, pleasant, NAD  HEENT: NC/AT, PERRL, MMM  Respiratory: breathing comfortably, symmetric chest expansion,  CTAB  Cardiovascular: RRR, normal S1, S2, no M/R/G, no edema  GI: obese, soft, normoactive bowel sounds, non tender, non distended, no HSM  Extremities: no edema  Neuro: A&O x 1-2, no asterixis  Psych: normal affect    Data  CBC        12/11/21  1618 12/11/21  1328 12/11/21  0328   WBC 1.7* 1.8* 1.8*   HGB 8.6* 8.0* 9.1*   HCT 25.7* 24.1* 27.2*   PLT 56* 67* 48*     Coags        12/11/21  0328   PTT 41.0*   INR 1.5*     Chem7        12/11/21  1144 12/11/21  0328   NA 140 142   K 4.5 2.7*   CL 113* 113*   CO2 23 22   BUN 11 12   CREAT 0.40* 0.39*   GLU 65* 61*     Electrolytes        12/11/21  1144 12/11/21  0328   CA 7.4* 7.2*   MG  --  1.7     Liver Panel        12/11/21  1144 12/11/21  0328  AST 116* 106*   ALT 99* 94*   ALKP 627* 584*   TBILI 15.0* 13.3*   TP 3.4* 3.1*   ALB 1.9* 1.8*     Amylase/Lipase  No results found in last 36 hours    Lactate  No results found in last 36 hours    Blood Gas  No results found in last 36 hours    BNP  No results found in last 36 hours    Troponin  No results found in last 36 hours      Lipid Panel  No results found in last 7 days    Hemoglobin A1c  No results found in last 7 days    TSH        12/10/21  0208   TSH 2.65     HIV  No results found in last 7 days       CRP  No results found in last 30 days  UA        12/10/21  0004   GUA NEG   BIUA Small*   KEUA NEG   SGUA 1.037*   HBUA Large*   PHUA 6.0   PRUA 30*   NIUA NEG   WEUA POS*       Microscopy:        12/10/21  0004   WCU >50*   RCU >50*   SQEU >10     @FEVSF [phone]:5:0:0:-1)@  Microbiology results - 7 Days   Microbiology Results   Date Collected Specimen Source Result   12/10/2021 12:04 AM MRSA Culture Anterior Nares Swab  Methicillin Resistant Staph Aureus Screen No Methicillin resistant Staphylococcus aureus (MRSA)isolated.     12/10/2021 12:04 AM COVID-19 RNA, RT-PCR/Nucleic Acid Amplification RETEST (Asymptomatic and No Specific COVID-19 Suspicion); No; No; No; No Procedure Planned; Unknown Bilateral Anterior  Nares +/- OP Swab  Covid-19 Rna (SFK81) Not detected   Comments (EXN17) See Comment     12/10/2021 12:04 AM Urine Culture Urine, Midstream  Bacterial Culture, Urine W/o Gram Stain >100,000 Candida albicans     12/10/2021  2:09 AM Peripheral Blood Culture Peripheral Blood  Peripheral Blood Culture No growth 1 day.     12/10/2021  2:11 AM Peripheral Blood Culture Peripheral Blood  Peripheral Blood Culture No growth 1 day.       Radiology Results (last 24 hours)     Procedure Component Value Units Date/Time    US Abdomen Limited with Doppler [[id number]] Collected: 12/11/21 1150    Order Status: Completed Updated: 12/11/21 1353    Narrative:      US ABDOMEN LIMITED WITH DOPPLER:  12/11/2021 11:33 AM    INDICATION:    RUQ [place] with doppler, c/f amyloid    COMPARISON: PET/CT 01/01/2020.    TECHNIQUE: An abdominal sonogram was performed with real-time sonography using 4-9 mHz transducers.    FINDINGS:     Liver:  Measures 19.2 cm. Diffusely increased echogenicity. No focal lesion. Normal surface contour.  Bile ducts: The common bile duct measures 2 mm. No biliary ductal dilatation.  Gallbladder:  Nondistended. Large shadowing gallstone. No wall thickening or pericholecystic fluid. Nontender.    Doppler findings:  Splenic vein: Patent with normal direction of flow.  Main portal vein diameter: 1.5 cm  Portal veins: Patent with normal direction of flow.  Hepatic artery: Patent with normal arterial waveforms.  Hepatic veins: Patent with phasic waveforms.  Inferior vena cava: Patent with phasic flow.    Right pleural effusion. Small perihepatic ascites.  Impression:        1.  Hepatomegaly with diffusely increased echogenicity which can be seen with hepatic amyloidosis or steatosis. Please refer to the recently performed biopsy for further details.     2.  Mild perihepatic ascites.    3.  Patent hepatic vasculature with normal waveforms.    4.  Cholelithiasis without acute cholecystitis.    5.  Right pleural effusion.    Report  dictated by: Talbert Forest, MD, signed by: Vonzella Nipple, MD  Department of Radiology and Biomedical Imaging    IR Transjugular Bx [[id number]] Collected: 12/11/21 1328    Order Status: Completed Updated: 12/11/21 1330    Narrative:      PROCEDURE: Transjugular liver biopsy with pressure measurements    Procedural Personnel  Attending physician(s): Bluford Main, MD  Trainee physician(s): Joni Fears, MD    Pre-procedure diagnosis: Rising LFTs, encephalopathy, hyperbilirubinemia  Post-procedure diagnosis: Same  Indication: Elevated liver enzymes    Complications: No immediate complications.    PROCEDURE SUMMARY:  - Venous access with ultrasound guidance (63016)  - Hepatic vein catheterization (01093)  - Hepatic venography with hemodynamic evaluation (23557)  - Transjugular liver biopsy with fluoroscopic guidance (37200, 32202)  - Additional procedure(s): None    PROCEDURE DETAILS:    Pre-procedure  Consent: Informed consent for the procedure including risks, benefits and alternatives was obtained and time-out was performed prior to the procedure.  Preparation: The site was prepared and draped using maximal sterile barrier technique including cutaneous antisepsis.  Antibiotic prophylaxis: None    Anesthesia/sedation  Level of anesthesia/sedation: Moderate sedation (54270, [id number])  Anesthesia/sedation administered by: Independent trained observer under attending supervision who provided continuous patient monitoring to assess level of consciousness and physiologic status  Continuous face-to-face sedation time (minutes): 70    Access  Local anesthesia was administered. The vessel was sonographically evaluated and determined to be patent. Real time ultrasound was used to visualize needle entry into the vessel and a permanent image was stored. A 10 French sheath was placed.  Vein accessed: Right internal jugular vein  Access technique: Micropuncture set with 21 gauge needle    Venography  Vein catheterized: Right  hepatic vein  Indication for venography: Document catheter position  Findings: Patent hepatic vein without focal narrowing.     Pressure measurements  Right atrial and inferior vena cava pressure measurements were obtained through the sheath. Free and wedged hepatic venous pressures were obtained through a balloon occlusion catheter.  Mean right atrial pressure (mmHg): 4  Mean inferior vena cava pressure (mmHg): 5  Mean free hepatic vein pressure (mmHg): 8   Mean wedged hepatic vein pressure (mmHg): 16     Biopsy  Samples were obtained of the liver parenchyma from the hepatic vein using the transjugular liver biopsy set.  Core needle biopsy device: [facility] Liver Access and Biopsy Needle Set  Core needle size (gauge): 19  Number of core specimens: 3    Closure  The sheath was removed and hemostasis was achieved with manual compression. A sterile bandage was applied.    Contrast  Contrast agent: Omnipaque 350  Contrast volume (mL): 30    Radiation Dose  Fluoroscopy time (minutes): 5:47    Reference air kerma (mGy): 40.88   Kerma area product (mGy-cm2): 6237     Additional Details  Estimated blood loss (mL): Less than 10  Standardized report: [email or web address]ressures_[email or web address]      Impression:        Transjugular liver biopsy  with multiple specimens obtained and pressure measurements as documented above.    Attestation  Signer name: Bluford Main, MD  I attest that I was present for the entire procedure. I agree with the report as written. No unintentionally retained devices were noted upon review of the images.              IMAGING:    MRCP 12/08/21:   1. Cholelithiasis with mild gallbladder wall edema. Findings are disfavored to represent acute cholecystitis. Gallbladder wall thickening can be seen in the setting of underlying liver disease or cardiac disease.   2. No choledocholithiasis or biliaryobstruction.   3. Marked hepatic steatosis.   4. Moderate bilateral pleural effusions and adjacent  atelectasis.    [place] 12/08/21:   Impressions:   1. No interval change without acute intracranial abnormalities.   2. Mild senescent changes are noted without obvious acute infarct. No hemorrhage, mass or midline shift. No findingsto suggest cerebral edema.    NM Hepatobiliary 12/09/21:   1. Normal radiotracer filling of the gallbladder is identified, therefore the exam is not consistent with cystic duct obstruction or acute cholecystitis.   2. Poor radiotracer uptake and diminished radiotracer excretion by the liver compatible with sequela of hepatocellular dysfunction.    RUQ U/S 12/03/21:   FINDINGS: Liver is mildly enlarged measuring 17.4 cm. There was diffuse increased echogenicity without focal lesions. Liver capsule was smooth. No dilated intra or extrahepatic ducts, common bile duct measured 4 mm. The gallbladder contained a large shadowing stone measuring 2 cm. There were 2 gallbladder polyps measuring 6 mm and 3 mm. No gallbladder wall thickening or tenderness. Right kidney measured 11.4 cm. Normal echogenicity and no hydronephrosis. Previously seen cyst on the lower pole the right kidney was not visualized.   IMPRESSION:   1. Liver is enlarged with diffuse increased echogenicity. Finding is nonspecific but consistent with fatty infiltration.   2. Large 2 cm stone in the gallbladder, no sonographic evidence of acutecholecystitis.   3. Two gallbladder polyps measuring 6 mm and 3 mm.    TTE 12/03/21:   Conclusions:   The left ventricular cavity size is normal.   Normal LV wall thickness.   The left ventricular ejection fraction is in normal range at 68% by biplane MOD.   No LV regional wall motion abnormalities.   Normal diastolic function for age.       ENDOSCOPY:  No results found for this or any previous visit.  No results found for this or any previous visit.    PATHOLOGY:   Liver biopsy 12/11/21 - pending     ASSESSMENT AND RECOMMENDATIONS  Ariel Braun is a 61 y.o. 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 Hepatology was consulted for work-up of elevated liver tests.      She started daratumumab, pomylast, and dexamethasone weekly in 2021. 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 Her ALKP and bilirubin rose to 278 and 1.5, respectively on 10/13/21. During her hospitalization at [facility] for hypovolemic shock in the setting of  diarrhea from norovirus, her liver tests showed an elevated ALKP in the 1000s (GGT was in the 1000s at the time as well), AST in the 140s-160s, and ALT in the 120s-180s. Her bilirubin had also steadily rose to the 10s. Today, her ALT is 116, AST is 101, ALKP has improved to 745, and Tbili is now 14.6.     Overall, the pattern of her liver tests is mixed, though primarily cholestatic. Overall we are most concerned about infiltrative disease, especially amyloid in the setting of MM. She is now s/p liver biopsy on 1/27 with elevated hepatic venous pressure gradient of 8. She has signs of hepatomegaly on RUQ [place] that was done at [facility], which also showed fatty liver. Other ddx for her elevated liver tests are vanishing duct syndrome, cholestasis of sepsis, DILI (antibiotics received at [facility] or chemotherapy, though has been on the same regimen since 2021), viral hepatitis, PBC, autoimmune cholangiopathy, or PSC. Of note, there are also case reports of norovirus causing elevated transaminases as well.     Her work-up done at [facility] was negative for hepatitis A (IgM neg, no IgG sent), hepatitis C (Ab  neg 12/06/21), and hepatitis B (HBsAg neg from 122/23). She also had a low ASMA and IgG, making autoimmune hepatitis less likely. She also had an MRCP that was equivocal for acute cholecystitis, though HIDA scan was negative (suspect she has reactive gallbladder edema). She does not have choledocholithiasis or biliary obstruction on imaging. Her TTE also did not show signs of heart failure would lead to hepatic congestion. Given her age,she isless likely to haveWilson's (tends to have 4:1 bilirubin to ALKP ratio) or A1AT.    She has mild coagulopathy with an INR of 1.4 and new thrombocytopenia, which could be from acute illness, medication effect, vs. 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 I suspect that her encephalopathy is multifactorial; with an ammonia of 25, our suspicion for hepatic encephalopathy is low.     Recommendations:   - Follow-up with the pending studies:              Ferritin and iron studies    HEV IgG/IgM   PCR forVZV, HSV, CMV,EBV  Please send AMA, IgG4              Hepatitis A IgG              Hepatitis B core Ab, surface Ab  - We will review her liver biopsy with pathology   - Review medications with pharmacy (i.e. cephalopsporins, limit tylenol)   - Daily LFTs and INR     Thank you for involving [place] in [place] care, we will continue to follow with you.  Please don't hesitate to page [phone] with questions.  This patient was discussed with attending, Dr. March Rummage on 12/11/21.

## 2021-12-11 NOTE — Consults (Signed)
IR Brief Consult    Thank you for hte consult. 61 yo with multiple myeloma with new, markedly abnormal liver function tests. Will add on for transjugular liver biopsy with pressure measurements today. Please keep NPO, trnasfuse to goal platelets > 50.    K. Fayne Mediate, MD  Associate Professor of Clinical Radiology

## 2021-12-11 NOTE — Progress Notes (Addendum)
[facility] PROGRESS NOTE  ATTENDING ONLY     My date of service is 12/11/2021.    24 Hour Course  -No acute events overnight.    -K 2.7 this AM, received 100 meq K and now 4.5 this Pm  -Tbili 14.6->13.3 this AM, other LFT's unchanged  -Went for transjugular liver bx with portal pressures, will follow up  -Appreciate hepatology recs, studies sent off this AM  -Plt 48 this AM, gave 1u plt  -ANC 1.96->1.47  -Abd [place]:   Hepatomegaly with diffusely increased echogenicity which can be seen with hepatic amyloidosis or steatosis. Full study read below  -Wound care noting bilateral buttocks moisture/friction damage    Subjective  Patient seen and examined at bedside this AM.  In NAD.  Pleasant but confused, encephalopathic, roughly AAOX1-2 (knew name, month/year, but thought we were at [facility]).  Endorses nausea but otherwise denying further diarrhea, fevers/chills, or generalized pain.        Vitals  Temp:  [36.3 C (97.3 F)-37.4 C (99.3 F)] 36.3 C (97.3 F)  Heart Rate:  [64-80] 68  *Resp:  [15-53] 22  BP: (80-105)/(55-89) 105/71  SpO2:  [92 %-98 %] 94 %    MostRecent Weight: 83 kg (182 lb 15.7 oz)  Admission Weight: 79.6 kg (175 lb 7.8 oz)      Intake/Output Summary (Last 24 hours) at 12/11/2021 1547  Last data filed at 12/11/2021 1528  Gross per 24 hour   Intake 6067 ml   Output 1000 ml   Net 5067 ml       Pain Score: 0    Physical Exam  Appearance: She is ill-appearing.      Comments: Alert and oriented X 1-2, slow to respond. Confused.    HENT:      Head: Normocephalic and atraumatic.      Nose: Nose normal.   Neck:  R IJ CVC with dressing in place, c/d/i  Eyes:      General: Scleral icterus present.      Extraocular Movements: Extraocular movements intact.      Conjunctiva/sclera: Conjunctivae normal.      Pupils: Pupils are equal, round, and reactive to light.   Cardiovascular:      Rate and Rhythm: Normal rate and regular rhythm.      Pulses: Normal pulses.      Heart sounds: Normal heart  sounds. No murmur heard.    No friction rub. No gallop.   Pulmonary:      Effort: Pulmonary effort is normal. No respiratory distress.      Breath sounds: Normal breath sounds. No stridor. No wheezing, rhonchi or rales.   Abdominal:      General: Abdomen is flat. Bowel sounds are normal. There is no distension.      Palpations: Abdomen is soft. There is no mass.      Tenderness: There is no abdominal tenderness. There is no guarding or rebound.      Hernia: No hernia is present.      Comments: Foley catheter in place, urine appears dark, scant and purulent.   No suprapubic tenderness.   Musculoskeletal:         General: Normal range of motion.      Cervical back: Normal range of motion and neck supple.      Right lower leg: Edema present.      Left lower leg: Edema present.      Comments: Strength 1/5 in the bilateral lower extremities, unable  to raise them to gravity.   Pitting edema 3-4+ in the bilateral lower extremities.   Anasarca in the abdomen and thighs bilaterally.    Skin:     General: Skin is warm.      Capillary Refill: Capillary refill takes less than 2 seconds.      Coloration: Skin is jaundiced.      Findings: Bruising present.      Comments: ecchymoses on the sternum   Neurological:      Mental Status: She is alert. She is disoriented.      Motor: Weakness present.       Scheduled Meds:   0.9% sodium chloride flush  10-20 mL Intravenous Q12H [place]    0.9% sodium chloride flush  3 mL Intravenous Q12H [place]    acyclovir  250 mg/m2 (Adjusted) Intravenous Q12H (RESP)    piperacillin-tazobactam  4.5 g Intravenous Q8H    senna  17.2 mg Oral Daily At Bedtime [facility]     Continuous Infusions:   sodium chloride 75 mL/hr (12/10/21 1259)     PRN Meds:   0.9% sodium chloride flush  10-20 mL Intravenous PRN    0.9% sodium chloride flush  3 mL Intravenous PRN    ondansetron  8 mg Intravenous Q8H PRN    polyethylene glycol  17 g Oral Daily PRN       Data    CBC        12/11/21  1328 12/11/21  0328   WBC 1.8* 1.8*    HGB 8.0* 9.1*   HCT 24.1* 27.2*   PLT 67* 48*     Coags        12/11/21  0328   PTT 41.0*   INR 1.5*     Chem7        12/11/21  1144 12/11/21  0328   NA 140 142   K 4.5 2.7*   CL 113* 113*   CO2 23 22   BUN 11 12   CREAT 0.40* 0.39*   GLU 65* 61*     Electrolytes        12/11/21  1144 12/11/21  0328   CA 7.4* 7.2*   MG  --  1.7     Liver Panel        12/11/21  1144 12/11/21  0328   AST 116* 106*   ALT 99* 94*   ALKP 627* 584*   TBILI 15.0* 13.3*   TP 3.4* 3.1*   ALB 1.9* 1.8*     Lactate  No results found in last 36 hours    Microbiology Results (last 24 hours)     Procedure Component Value Units Date/Time    Urine Culture [[id number]]  (Abnormal) Collected: 12/10/21 0004    Order Status: Completed Specimen: Not Applicable from Urine, Midstream Updated: 12/11/21 1342     Bacterial Culture, Urine w/o gram stain >100,000 Candida albicans    Hepatitis E Antibody, IgM [[id number]] Collected: 12/11/21 1144    Order Status: Sent Specimen: Not Applicable from Serum Updated: 12/11/21 1218    Cytomegalovirus DNA, Quantitative PCR, Plasma [[id number]] Collected: 12/11/21 1144    Order Status: Sent Specimen: Not Applicable from Serum Updated: 12/11/21 1218    Hepatitis A virus Antibody IgG [[id number]]     Order Status: Sent Specimen: Not Applicable from Blood     Varicella zoster virus DNA [[id number]]     Order Status: Sent Specimen: Not Applicable from Blood, EDTA Tube     Herpes Simplex  Virus PCR, Quantitative [[id number]]     Order Status: Sent Specimen: Not Applicable from Blood, EDTA Tube     Epstein-Barr Virus by Quantitative PCR [[id number]]     Order Status: Sent Specimen: Not Applicable from Blood     Hepatitis B Surface Antigen [[id number]]     Order Status: Sent Specimen: Blood from Serum     Hepatitis B Surface Antibody, Quantitative [[id number]]     Order Status: Sent Specimen: Not Applicable from Serum     Hepatitis B Core Antibody, Total [[id number]]     Order Status: Sent Specimen: Not Applicable from Serum     MRSA  Culture [[id number]] Collected: 12/10/21 0004    Order Status: Completed Specimen: Anterior Nares Swab Updated: 12/11/21 1046     Methicillin Resistant Staph aureus Screen No Methicillin resistant Staphylococcus aureus (MRSA)isolated.    Peripheral Blood Culture [[id number]] Collected: 12/10/21 0209    Order Status: Completed Specimen: Not Applicable from Peripheral Blood Updated: 12/11/21 0707     Peripheral Blood Culture No growth 1 day.    Peripheral Blood Culture [[id number]] Collected: 12/10/21 0211    Order Status: Completed Specimen: Not Applicable from Peripheral Blood Updated: 12/11/21 0707     Peripheral Blood Culture No growth 1 day.          Radiology Results   IR Transjugular Bx    Result Date: 12/11/2021  Transjugular liver biopsy with multiple specimens obtained and pressure measurements as documented above. Attestation Signer name: Bluford Main, MD I attest that I was present for the entire procedure. I agree with the report as written. No unintentionally retained devices were noted upon review of the images.      US Abdomen Limited with Doppler    Result Date: 12/11/2021  1.  Hepatomegaly with diffusely increased echogenicity which can be seen with hepatic amyloidosis or steatosis. Please refer to the recently performed biopsy for further details. 2.  Mild perihepatic ascites. 3.  Patent hepatic vasculature with normal waveforms. 4.  Cholelithiasis without acute cholecystitis. 5.  Right pleural effusion. 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 The patient has received VRd X 4 (achieving VGPR) then Mel 200 ASCT 05/12/11. 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 She met the criteria for PD on 02/19/2020 and was started on third line DARA PD in June 2021, which was recently  held on 10/20/21 due to chronic diarrhea. Patient was transferred OSH after recent hypovolemic/distributive shock. Here for acute encephalopathy workup and acute liver failure workup.      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   June 2020 due to progression of M spike Revlimid was increased to 15 mg PO D1-21 every 28 days, plus dexamethasone  July 2021: started daratumumab, Pomylast 2 mg D 1-21, Q28 days, dexamethasone 12 mg PO Q weekly.   Primary oncologist: Dr. Iona Coach,  Dr. Kennon Rounds [facility]  Receiving dexamethasone 12 mg weekly for diarrhea  Receiving MT with daratumumab with pomalyst and dex, most recent dose 10/16/21, has since been on hold  due to worsening diarrhea.     DATA:   09/07/2010: IgG Kappa MM  12/02/2021: Ig M <5, IgG 447, IgA 20, Ig E <2, SFLC Kappa 16.6, Lambda 1.6, Kappa/Lamda 10.38    Chemo:   Holding Daratumumab and Pomalyst, last dose 10/20/21, given recent infection and acute liver failure.    Supportive Care:   Transfusion: Keep Hg >7, and Platelets >10.     Per daughter, has autoantibodies in blood was a difficult match. Would like to be contacted prior to transfusion.     Line: PICC placed 1/26    Outpatient Oncologist: Dr. Kennon Rounds    Dispo: TBD, after resolution of sepsis and liver failure      # Immunocompromise  # At risk for infections due to malignancy  # Shock, likely hypovolemic vs distributive in the setting of chronic diarrhea, + norovirus infection   # Acute on chronic diarrhea, norovirus positive., diff and OP negative.   -No prior evidence of adrenal insufficiency, AM cortisol levels normal, previously on dexamethasone 8 mg PO Qweekly, but has been held since early December.   -Continue IV Zosyn 4.5 g Q8hrs  -Monitor I/O's      OI ppx: Acyclovir IV    #  Grade III neutropenia, at baseline  # Leukopenia  #Thrombocytopenia (possibly 2/2 liver disease)  Data: WBC of 2.2 on 12/05/21, on Zarxio at home  Plan: Continue to monitor    # Acute liver failure of unclear etiology  # Acute on chronic encephalopathy likely 2/2 infectious vs hepatic vs metabolic  # Hyperbilirubinemia  # Anasarca  # Hypoalbuminemia    Data:   -Steady rise in LFTs in 05/2021,  -12/02/21: AST 152--> 121, ALT 130--> 110, TB 5.4, Albumin 1.1  -Hepatitis panel negative.   -1/26:  Tbili 14.6, GGT 1160, ammonia 25  -CTAP 12/02/21: Scattered sclerotic lesions in the lumbar spine, hepatic steatosis.   -RUQ ultrasound: liver enlarged with diffuse increased echogenicity, consistent with fatty infiltration. 2cm stone in the gallbladder.   -HIDA scan: Normal radiotracer filling of the gallbladder is identified, therefore the exam is not consistent with cystic duct obstruction or acute cholecystitis.    [place] abd 1/27:   Hepatomegaly with diffusely increased echogenicity which can be seen with hepatic amyloidosis or steatosis. Please refer to the recently performed biopsy for further details.     2.  Mild perihepatic ascites.    3.  Patent hepatic vasculature with normal waveforms.    4.  Cholelithiasis without acute cholecystitis.    5.  Right pleural effusion.    Portal pressures:  Mean right atrial pressure (mmHg): 4  Mean inferior vena cava pressure (mmHg): 5  Mean free hepatic vein pressure (mmHg): 8   Mean wedged hepatic vein pressure (mmHg): 16     Plan:   []  Hepatology consult, follow up recs.   [ ]  Liver bx 1/27, follow up patholoy      # History of PE, diagnosed in April 2019,   - hold home apixiban for now given recent anemia and concerns for bleeding.    -monitor for signs and symptoms of worsening bleeding.     # Elevated TSH at OSH, likely 2/2 subclinical hypothyroidism  -repeat TSH when acute sickness resolved.     # Anxiety, emotional distress:   -Patient has stressful social situation,  intellectually disabled son, and regarding underlying disease process.     #Chronic buttocks pressure wound  -Wound care consult, appreciate recs  -Position changes as  tolerated, waffle cushion, etc    #Hypokalemia on admission managed with repletion   -repleting prn, goal k>4, trend daily labs    VTE PPx:  Contraindicated: Anticipated procedure        Severity of Illness                 During this hospitalization the patient is also being treated for:  - Sepsis (present on admission)    - Malignancy associated fatigue on admission  - Neoplasm/malignancy associated pain  - Hypoalbuminemia (present on admission)  - Fluid status: Hypovolemia on admission     - Cachexia  - Encephalopathy: Metabolic encephalopathy    Code Status: FULL    Zena Amos, DO  12/11/2021

## 2021-12-11 NOTE — H&P (Signed)
INTERVENTIONAL RADIOLOGY PRE-PROCEDURE NOTE       PLANNED PROCEDURE:  TJLB    HISTORY OF PRESENT ILLNESS:   Ariel Braun is a 61 y.o. 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 The patient has received VRd X 4 (achieving VGPR) then Mel 200 ASCT 05/12/11. 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 She met the criteria for PD on 02/19/2020 and was started on third line DARA PD in June 2021, which was recently held on 10/20/21 due to chronic diarrhea. Patient was transferred OSH after recent hypovolemic/distributive shock. Here for acute encephalopathy workup and acute liver failure workup.    Plan for TJLB with PP.     Past Medical History:   Diagnosis Date    Acid reflux disease     Anxiety     Depression     Hereditary angioedema (CMS code)     Multiple myeloma, without mention of having achieved remission     IgG kappa    Myeloma (CMS code) 05/10/2011       Past Surgical History:   Procedure Laterality Date    AUTOLOGOUS STEM CELL TRANSPLANTATION  05/12/11    BREAST CYST EXCISION  1981       Allergies/Contraindications   Allergen Reactions    Caffeine Other (See Comments)     jitters    Diphenoxylate-Atropine Other (See Comments)     Severe stomach pain         Current Facility-Administered Medications on File Prior to Encounter   Medication Dose Route Frequency Provider Last Rate Last Admin    [DISCONTINUED] GENERIC EXTERNAL MEDICATION     Generic External Data Provider         Current Outpatient Medications on File Prior to Encounter   Medication Sig Dispense Refill    acyclovir (ZOVIRAX) 400 mg tablet Take 1 tablet (400 mg total) by mouth Twice a day 180 tablet 3    apixaban (ELIQUIS) 5 mg tablet Take 5 mg by mouth 2 (two) times daily      cholestyramine light (CHOLESTYRAMINE LIGHT) 4 gram packet Take 1 packet (4 g total) by mouth Twice a day 180 packet 3    dicyclomine (BENTYL) 10 mg capsule Take 10 mg by mouth 3 (three) times daily as needed  (stomach pain)      multivitamin tablet Take 1 tablet by mouth daily      amoxicillin (AMOXIL) 500 mg capsule Take 4 capsules (2,000 mg total) by mouth daily as needed (1 hour prior to dental procedures) (Patient not taking: Reported on 09/22/2021) 4 capsule 3    dexAMETHasone (DECADRON) 4 mg tablet TAKE 2 TABLETS (8 MG TOTAL) BY MOUTH EVERY 7 DAYS. 24 tablet 3    ecallantide 10 mg/mL (1 mL) SOLN Inject under the skin once as needed      EPINEPHrine (EPIPEN) 0.3 mg/0.3 mL (1:1,000) injection Inject 0.3 mg into the muscle once as needed Use as instructed      hydrOXYzine (ATARAX) 25 mg tablet Take 0.5-1 tablets (12.5-25 mg total) by mouth every 8 (eight) hours as needed for Anxiety 30 tablet 1    icatibant (FIRAZYR) 30 mg/3 mL injection Inject under the skin once as needed      LORazepam (ATIVAN) 1 mg tablet Take 1 mg by mouth every 6 (six) hours as needed      metoclopramide HCl (REGLAN) 10 mg tablet Take 10 mg by mouth once as needed  ondansetron (ZOFRAN) 8 mg tablet Take 1 tablet (8 mg total) by mouth every 8 (eight) hours as needed. 30 tablet 2    pomalidomide 2 mg CAP Take 2 mg by mouth daily 21/28 days      predniSONE (DELTASONE) 20 mg tablet Take 20 mg by mouth once as needed         PHYSICAL EXAM   Gen: No acute distress  Cardiac: Regular rate  Pulmonary: Non-labor breathing  Abdomen: Nontender, non-distended  Neck: CVC in place site c/d/i, removed.      VITAL SIGNS:   Vitals:    12/11/21 0321 12/11/21 0322 12/11/21 0832 12/11/21 1154   BP:   98/66    BP Location:   Right upper arm    Patient Position:   Lying    Pulse: 64  71    Resp: 20  20    Temp:   37.4 C (99.3 F) 36.8 C (98.2 F)   TempSrc: Oral  Oral Temporal   SpO2: 98%  95%    Weight:  83 kg (182 lb 15.7 oz)     Height:           CBC, Creatinine:        12/11/21  0328 12/10/21  0208 10/13/21  0840   WBC 1.8* 2.5* 1.9*   HGB 9.1* 10.6* 12.8   HCT 27.2* 31.7* 39.3   PLT 48* 63* 271   CREAT 0.39* 0.53* 0.83     Abs Neutrophils  (x10E9/L)   Date Value   12/11/2021 1.47 (L)         Liver Panel:        12/11/21  0328 12/10/21  0208 10/13/21  0844 10/13/21  0840 09/15/21  0845 08/18/21  0822 08/18/21  0821   AST 106* 101*  --  49* 41*  --  37   ALT 94* 116*  --  33* 45*  --  40*   ALKP 584* 745*  --  278* 136*  --  130*   TBILI 13.3* 14.6*  --  1.5* 0.8  --  0.8   TP 3.1* 3.2* 4.7* 4.8* 4.9*  5.0*   < > 5.5*   ALB 1.8* 2.0*  --  3.2* 3.6*  --  3.8   GGT  --  1,160*  --   --   --   --   --     < > = values in this interval not displayed.       Coags:        12/11/21  0328 12/10/21  0208   PTT 41.0* 43.1*   INR 1.5* 1.4*

## 2021-12-11 NOTE — Consults (Signed)
PHYSICAL THERAPY CANCELED SESSION NOTE     Cancellation: Cancelled session  PT session missed for the following reasons (S): Patient at Procedure/Imaging  Missed/Cancelled Session Comment: Patient away for ultrasound and then plan for liver biopsy.  Attempted Therapy Session Time:: [address]    York Grice [place], [place]  12/11/2021

## 2021-12-12 LAB — COMPLETE BLOOD COUNT WITH DIFFERENTIAL
Abs Basophils: 0 10*9/L (ref 0.00–0.10)
Abs Eosinophils: 0 10*9/L (ref 0.00–0.40)
Abs Imm Granulocytes: 0.03 10*9/L (ref <0.10)
Abs Lymphocytes: 0.32 10*9/L — ABNORMAL LOW (ref 1.00–3.40)
Abs Monocytes: 0.13 10*9/L — ABNORMAL LOW (ref 0.20–0.80)
Abs Neutrophils: 1.59 10*9/L — ABNORMAL LOW (ref 1.80–6.80)
Hematocrit: 27.5 % — ABNORMAL LOW (ref 36.0–46.0)
Hemoglobin: 9.2 g/dL — ABNORMAL LOW (ref 12.0–15.5)
MCH: 32.2 pg (ref 26.0–34.0)
MCHC: 33.5 g/dL (ref 31.0–36.0)
MCV: 96 fL (ref 80–100)
MPV: 12.7 fL — ABNORMAL HIGH (ref 9.1–12.6)
Platelet Count: 60 10*9/L — ABNORMAL LOW (ref 140–450)
RBC Count: 2.86 10*12/L — ABNORMAL LOW (ref 4.00–5.20)
RDW-CV: 21.9 % — ABNORMAL HIGH (ref 11.7–14.4)
WBC Count: 2.1 10*9/L — ABNORMAL LOW (ref 3.4–10.0)

## 2021-12-12 LAB — POCT GLUCOSE
Glucose, Glucometer: 127 mg/dL (ref 70–199)
Glucose, Glucometer: 144 mg/dL (ref 70–199)
Glucose, Glucometer: 66 mg/dL — ABNORMAL LOW (ref 70–199)
Glucose, Glucometer: 65 mg/dL — ABNORMAL LOW (ref 70–199)
Glucose, Glucometer: 157 mg/dL (ref 70–199)
Glucose, Glucometer: 84 mg/dL (ref 70–199)

## 2021-12-12 LAB — ECG 12-LEAD
Atrial Rate: 74 {beats}/min
Calculated P Axis: -169 degrees
Calculated R Axis: -9 degrees
Calculated T Axis: 72 degrees
P-R Interval: 158 ms
QRS Duration: 76 ms
QT Interval: 450 ms
QTcb: 500 ms
Ventricular Rate: 74 {beats}/min

## 2021-12-12 LAB — PREPARE PLATELETS
Blood Product Expiration Date: 202301272359
Blood Product Issue Date/Time: 202301271039
Platelets - Units Ready: 1
UDIV: 0
Unit Type and Rh: 6200
Unit Type and Rh: POSITIVE

## 2021-12-12 LAB — COMPREHENSIVE METABOLIC PANEL (BMP, AST, ALT, T.BILI, ALKP, TP ALB)
AST: 108 U/L — ABNORMAL HIGH (ref 5–44)
Alanine transaminase: 96 U/L — ABNORMAL HIGH (ref 10–61)
Albumin, Serum / Plasma: 1.7 g/dL — ABNORMAL LOW (ref 3.4–4.8)
Alkaline Phosphatase: 536 U/L — ABNORMAL HIGH (ref 38–108)
Anion Gap: 6 (ref 4–14)
Bilirubin, Total: 13.6 mg/dL — ABNORMAL HIGH (ref 0.2–1.2)
Calcium, total, Serum / Plasma: 7.5 mg/dL — ABNORMAL LOW (ref 8.4–10.5)
Carbon Dioxide, Total: 22 mmol/L (ref 22–29)
Chloride, Serum / Plasma: 114 mmol/L — ABNORMAL HIGH (ref 101–110)
Creatinine: 0.41 mg/dL — ABNORMAL LOW (ref 0.55–1.02)
Glucose, non-fasting: 68 mg/dL — ABNORMAL LOW (ref 70–199)
Potassium, Serum / Plasma: 3.3 mmol/L — ABNORMAL LOW (ref 3.5–5.0)
Protein, Total, Serum / Plasma: 3.1 g/dL — ABNORMAL LOW (ref 6.3–8.6)
Sodium, Serum / Plasma: 142 mmol/L (ref 135–145)
Urea Nitrogen, serum/plasma: 10 mg/dL (ref 7–25)
eGFRcr: 112 mL/min/{1.73_m2} (ref >59)

## 2021-12-12 LAB — COMPLETE BLOOD COUNT
Hematocrit: 25.7 % — ABNORMAL LOW (ref 36.0–46.0)
Hemoglobin: 8.6 g/dL — ABNORMAL LOW (ref 12.0–15.5)
MCH: 32.8 pg (ref 26.0–34.0)
MCHC: 33.5 g/dL (ref 31.0–36.0)
MCV: 98 fL (ref 80–100)
MPV: UNDETERMINED fL (ref 9.1–12.6)
Platelet Count: 56 10*9/L — ABNORMAL LOW (ref 140–450)
RBC Count: 2.62 10*12/L — ABNORMAL LOW (ref 4.00–5.20)
RDW-CV: 21.8 % — ABNORMAL HIGH (ref 11.7–14.4)
WBC Count: 1.7 10*9/L — ABNORMAL LOW (ref 3.4–10.0)

## 2021-12-12 LAB — PROTHROMBIN TIME
INR: 1.4 — ABNORMAL HIGH (ref 0.9–1.2)
PT: 16.4 s — ABNORMAL HIGH (ref 11.6–15.0)

## 2021-12-12 LAB — RAPID HSV DNA, SKIN LESION/BLOOD
Comments: NOT DETECTED
HSV1 DNA: NOT DETECTED
HSV2 DNA: NOT DETECTED

## 2021-12-12 LAB — VARICELLA ZOSTER VIRUS DNA
Varicella Zoster DNA: NOT DETECTED
Varicella Zoster DNA: NOT DETECTED

## 2021-12-12 MED ORDER — CENTRUM SILVER 0.4 MG-300 MCG-250 MCG TABLET
0.4-300-250 | ORAL | Status: AC
Start: 2021-12-12 — End: 2022-01-09

## 2021-12-12 MED ORDER — DEXTROSE 50 % IN WATER (D50W) INTRAVENOUS SYRINGE
Freq: Once | INTRAVENOUS | Status: AC
Start: 2021-12-12 — End: 2021-12-12
  Administered 2021-12-12: 19:00:00 25 g via INTRAVENOUS

## 2021-12-12 MED ORDER — POTASSIUM CHLORIDE 20 MEQ/50 ML IN STERILE WATER INTRAVENOUS PIGGYBACK
20 mEq/50 mL | INTRAVENOUS | Status: DC | PRN
  Administered 2021-12-12 – 2022-01-11 (×12): via INTRAVENOUS

## 2021-12-12 MED ORDER — POTASSIUM CHLORIDE ER 10 MEQ TABLET,EXTENDED RELEASE (~~LOC~~ WRAPPER)
10 | Freq: Every day | ORAL | Status: AC | PRN
Start: 2021-12-12 — End: 2022-01-15

## 2021-12-12 MED ORDER — DEXTROSE 5 % AND 0.9 % SODIUM CHLORIDE INTRAVENOUS SOLUTION
INTRAVENOUS | Status: DC
Start: 2021-12-12 — End: 2022-01-15
  Administered 2021-12-12 – 2021-12-22 (×13): 75 mL/h via INTRAVENOUS
  Administered 2021-12-23 – 2022-01-02 (×20): via INTRAVENOUS
  Administered 2022-01-03: 14:00:00 75 mL/h via INTRAVENOUS
  Administered 2022-01-03: 03:00:00 via INTRAVENOUS
  Administered 2022-01-04: 05:00:00 75 mL/h via INTRAVENOUS
  Administered 2022-01-05 – 2022-01-06 (×3): via INTRAVENOUS
  Administered 2022-01-06: 09:00:00 75 mL/h via INTRAVENOUS
  Administered 2022-01-07: 08:00:00 via INTRAVENOUS
  Administered 2022-01-07: 22:00:00 75 mL/h via INTRAVENOUS
  Administered 2022-01-08 – 2022-01-09 (×2): via INTRAVENOUS
  Administered 2022-01-09: 13:00:00 75 mL/h via INTRAVENOUS
  Administered 2022-01-10 – 2022-01-14 (×8): via INTRAVENOUS
  Administered 2022-01-15: 75 mL/h via INTRAVENOUS

## 2021-12-12 MED ORDER — DEXTROSE 50 % IN WATER (D50W) INTRAVENOUS SYRINGE
50% | INTRAVENOUS | Status: AC
  Administered 2021-12-12: 14:00:00 via INTRAVENOUS

## 2021-12-12 MED ORDER — DEXTROSE 50 % IN WATER (D50W) INTRAVENOUS SYRINGE
INTRAVENOUS | Status: DC | PRN
Start: 2021-12-12 — End: 2021-12-29
  Administered 2021-12-13 – 2021-12-21 (×4): 12.5 g via INTRAVENOUS

## 2021-12-12 MED ORDER — MAGNESIUM SULFATE 2 GRAM/50 ML (4 %) IN WATER INTRAVENOUS PIGGYBACK
2 gram/50 mL (4 %) | INTRAVENOUS | Status: DC | PRN
  Administered 2021-12-17 – 2022-01-15 (×12): via INTRAVENOUS

## 2021-12-12 MED ORDER — CHOLESTYRAMINE-ASPARTAME 4 GRAM ORAL POWDER FOR SUSP IN A PACKET
4 | ORAL | 3.00 refills | Status: AC | PRN
Start: 2021-12-12 — End: 2022-01-09

## 2021-12-12 MED ORDER — GLUCOSE 4 GRAM CHEWABLE TABLET
4 gram | ORAL | Status: AC | PRN
Start: 2021-12-12 — End: 2021-12-29

## 2021-12-12 MED ORDER — MAGNESIUM SULFATE 1 GRAM/100 ML IN DEXTROSE 5 % INTRAVENOUS PIGGYBACK
1100 | Freq: Every day | INTRAVENOUS | Status: DC | PRN
Start: 2021-12-12 — End: 2022-01-15
  Administered 2021-12-26 – 2022-01-10 (×4): via INTRAVENOUS

## 2021-12-12 MED ORDER — POTASSIUM CHLORIDE 40 MEQ/100ML IN STERILE WATER INTRAVENOUS PIGGYBACK
40100 | Freq: Every day | INTRAVENOUS | Status: DC | PRN
Start: 2021-12-12 — End: 2022-01-15
  Administered 2021-12-12 – 2022-01-15 (×47): via INTRAVENOUS

## 2021-12-12 MED ORDER — JUICE (FOR HYPOGLYCEMIA)
ORAL | Status: DC | PRN
Start: 2021-12-12 — End: 2021-12-29

## 2021-12-12 MED FILL — ONDANSETRON HCL (PF) 4 MG/2 ML INJECTION SOLUTION: 4 mg/2 mL | INTRAMUSCULAR | Qty: 4

## 2021-12-12 MED FILL — ACYCLOVIR SODIUM 50 MG/ML INTRAVENOUS SOLUTION: 50 mg/mL | INTRAVENOUS | Qty: 8

## 2021-12-12 MED FILL — DEXTROSE 50 % IN WATER (D50W) INTRAVENOUS SYRINGE: INTRAVENOUS | Qty: 50

## 2021-12-12 MED FILL — PIPERACILLIN-TAZOBACTAM 4.5 GRAM INTRAVENOUS SOLUTION: 4.5 gram | INTRAVENOUS | Qty: 4500

## 2021-12-12 MED FILL — METOCLOPRAMIDE 5 MG/ML INJECTION SOLUTION: 5 mg/mL | INTRAMUSCULAR | Qty: 2

## 2021-12-12 MED FILL — POTASSIUM CHLORIDE 20 MEQ/50 ML IN STERILE WATER INTRAVENOUS PIGGYBACK: 20 mEq/50 mL | INTRAVENOUS | Qty: 50

## 2021-12-12 NOTE — Progress Notes (Signed)
[facility] PROGRESS NOTE  ATTENDING ONLY     My date of service is 12/12/2021.    24 Hour Course  -No acute events overnight.    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        Subjective  Patient seen and examined at bedside this AM.  In NAD.  Pleasant but with word finding difficulty.  More oriented today than previously.  Endorses nonspecific pain but otherwise ROS difficult to obtain 2/2 AMS.          Vitals  Temp:  [36.4 C (97.5 F)-37.2 C (99 F)] 37.2 C (99 F)  Heart Rate:  [65-78] 78  *Resp:  [18-20] 18  BP: (89-118)/(56-74) 99/74  SpO2:  [95 %-97 %] 97 %    MostRecent Weight: 81 kg (178 lb 9.2 oz)  Admission Weight: 79.6 kg (175 lb 7.8 oz)      Intake/Output Summary (Last 24 hours) at 12/12/2021 1529  Last data filed at 12/12/2021 1230  Gross per 24 hour   Intake 1484 ml   Output 650 ml   Net 834 ml       Pain Score: 0    Physical Exam  Appearance: She is ill-appearing.      Comments: Alert and oriented X 2-3, slow to respond. Confused.    HENT:      Head: Normocephalic and atraumatic.      Nose: Nose normal.   Neck:  R IJ CVC with dressing in place, c/d/i  Eyes:      General: Scleral icterus present.      Extraocular Movements: Extraocular movements intact.      Conjunctiva/sclera: Conjunctivae normal.      Pupils: Pupils are equal, round, and reactive to light.   Cardiovascular:      Rate and Rhythm: Normal rate and regular rhythm.      Pulses: Normal pulses.      Heart sounds: Normal heart sounds. No murmur heard.    No friction rub. No gallop.   Pulmonary:      Effort: Pulmonary effort is normal. No respiratory distress.      Breath sounds: Normal breath sounds. No stridor. No wheezing, rhonchi or rales.   Abdominal:       General: Abdomen is flat. Bowel sounds are normal. There is no distension.      Palpations: Abdomen is soft. There is no mass.      Tenderness: There is no abdominal tenderness. There is no guarding or rebound.      Hernia: No hernia is present.      Comments: Foley catheter in place, urine appears dark  No suprapubic tenderness.   Musculoskeletal:         General: Normal range of motion.      Cervical back: Normal range of motion and neck supple.      Right lower leg: Edema present.      Left lower leg: Edema present.      Comments: Strength 1/5 in the bilateral lower extremities, unable to raise them to gravity.   Pitting edema 3-4+ in the bilateral lower extremities.   Anasarca in the abdomen and thighs bilaterally.    Skin:     General: Skin is warm.  Capillary Refill: Capillary refill takes less than 2 seconds.      Coloration: Skin is jaundiced.      Findings: Bruising present.      Comments: ecchymoses on the sternum   Neurological:      Mental Status: She is alert. She is disoriented.      Motor: Weakness present.       Scheduled Meds:   0.9% sodium chloride flush  10-20 mL Intravenous Q12H [place]    0.9% sodium chloride flush  3 mL Intravenous Q12H [place]    acyclovir  250 mg/m2 (Adjusted) Intravenous Q12H (RESP)    senna  17.2 mg Oral Daily At Bedtime [facility]     Continuous Infusions:   dextrose 5 % and 0.9 % sodium chloride 75 mL/hr (12/12/21 1003)     PRN Meds:   0.9% sodium chloride flush  10-20 mL Intravenous PRN    0.9% sodium chloride flush  3 mL Intravenous PRN    magnesium sulfate in dextrose 5 %  2-4 g Intravenous Daily PRN    Or    magnesium sulfate in water  2-4 g Intravenous Daily PRN    metoclopramide  5-10 mg Intravenous Q8H PRN    ondansetron  8 mg Intravenous Q8H PRN    polyethylene glycol  17 g Oral Daily PRN    potassium chloride in sterile water  20-80 mEq Intravenous Daily PRN    Or    potassium chloride in sterile water  20-80 mEq Intravenous Daily PRN    Or    potassium  chloride  20-80 mEq Oral Daily PRN       Data    CBC        12/12/21  0329 12/11/21  1618 12/11/21  1328   WBC 2.1* 1.7* 1.8*   HGB 9.2* 8.6* 8.0*   HCT 27.5* 25.7* 24.1*   PLT 60* 56* 67*     Coags        12/12/21  0329   INR 1.4*     Chem7        12/12/21  0329 12/11/21  1144   NA 142 140   K 3.3* 4.5   CL 114* 113*   CO2 22 23   BUN 10 11   CREAT 0.41* 0.40*   GLU 68* 65*     Electrolytes        12/12/21  0329 12/11/21  1144   CA 7.5* 7.4*     Liver Panel        12/12/21  0329 12/11/21  1144   AST 108* 116*   ALT 96* 99*   ALKP 536* 627*   TBILI 13.6* 15.0*   TP 3.1* 3.4*   ALB 1.7* 1.9*     Lactate  No results found in last 36 hours    Microbiology Results (last 24 hours)     Procedure Component Value Units Date/Time    Rapid HSV DNA, Skin Lesion / Blood [[id number]] Collected: 12/12/21 0331    Order Status: Completed Specimen: Blood, EDTA Tube Updated: 12/12/21 1317     HSV1 DNA Not detected     HSV2 DNA Not detected     Comments --     Reference value for all analytes: Not Detected.  This test is a modification of the FDA authorized assay. Performance characteristics have been determined by the [facility].      Varicella zoster virus DNA [[id number]] Collected: 12/12/21 0331    Order Status:  Completed Specimen: Not Applicable from Blood, EDTA Tube Updated: 12/12/21 1316     Varicella Zoster DNA Not detected      Reference range: Not detected      This test was developed and its performance characteristics determined by the [facility].  It has not been cleared or approved by the U. S. Food and Drug Administration.    Herpes Simplex Virus PCR, Quantitative [[id number]] Collected: 12/12/21 0331    Order Status: Canceled Specimen: Not Applicable from Blood, EDTA Tube     Peripheral Blood Culture [[id number]] Collected: 12/10/21 0209    Order Status: Completed Specimen: Not Applicable from Peripheral Blood Updated: 12/12/21 0139     Peripheral Blood Culture No growth 2 days.     Peripheral Blood Culture [[id number]] Collected: 12/10/21 0211    Order Status: Completed Specimen: Not Applicable from Peripheral Blood Updated: 12/12/21 0139     Peripheral Blood Culture No growth 2 days.    Hepatitis A virus Antibody IgG [[id number]] Collected: 12/11/21 1618    Order Status: Sent Specimen: Not Applicable from Serum Updated: 12/11/21 1629    Epstein-Barr Virus by Quantitative PCR [[id number]] Collected: 12/11/21 1618    Order Status: Sent Specimen: Not Applicable from Serum Updated: 12/11/21 1629    Hepatitis B Surface Antigen [[id number]] Collected: 12/11/21 1618    Order Status: Sent Specimen: Blood from Serum Updated: 12/11/21 1629    Hepatitis B Surface Antibody, Quantitative [[id number]] Collected: 12/11/21 1618    Order Status: Sent Specimen: Not Applicable from Serum Updated: 12/11/21 1629    Hepatitis B Core Antibody, Total [[id number]] Collected: 12/11/21 1618    Order Status: Sent Specimen: Not Applicable from Serum Updated: 12/11/21 1629          Radiology Results  No results found.    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 The patient has received VRd X 4 (achieving VGPR) then Mel 200 ASCT 05/12/11. 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 She met the criteria for PD on 02/19/2020 and was started on third line DARA PD in June 2021, which was recently held on 10/20/21 due to chronic diarrhea. Patient was transferred OSH after recent hypovolemic/distributive shock. Here for acute encephalopathy workup and acute liver failure workup.      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   June 2020 due to progression of M spike Revlimid was increased to 15 mg PO D1-21 every 28 days, plus  dexamethasone  July 2021: started daratumumab, Pomylast 2 mg D 1-21, Q28 days, dexamethasone 12 mg PO Q weekly.   Primary oncologist: Dr. Iona Coach,  Dr. Kennon Rounds [facility]  Receiving dexamethasone 12 mg weekly for diarrhea  Receiving MT with daratumumab with pomalyst and dex, most recent dose 10/16/21, has since been on hold due to worsening diarrhea.     DATA:   09/07/2010: IgG Kappa MM  12/02/2021: Ig M <5, IgG 447, IgA 20, Ig E <2, SFLC Kappa 16.6, Lambda 1.6, Kappa/Lamda 10.38    Chemo:   Holding Daratumumab and Pomalyst, last dose 10/20/21, given recent infection and acute liver failure.    Supportive Care:   Transfusion: Keep Hg >7, and Platelets >10.  Per daughter, has autoantibodies in blood was a difficult match. Would like to be contacted prior to transfusion.     Line: PICC placed 1/26    Outpatient Oncologist: Dr. Kennon Rounds    Dispo: TBD, after resolution of sepsis and liver failure      # Immunocompromise  # At risk for infections due to malignancy  # Shock, likely hypovolemic vs distributive in the setting of chronic diarrhea, + norovirus infection   # Acute on chronic diarrhea, norovirus positive., diff and OP negative.   -No prior evidence of adrenal insufficiency, AM cortisol levels normal, previously on dexamethasone 8 mg PO Qweekly, but has been held since early December.   -Stop IV Zosyn 4.5 g Q8hrs (1/26-1/28)  -Monitor I/O's      OI ppx: Acyclovir IV    # Grade III neutropenia, at baseline  # Leukopenia  #Thrombocytopenia (possibly 2/2 liver disease)  Data: WBC of 2.2 on 12/05/21, on Zarxio at home  Plan: Continue to monitor    # Acute liver failure of unclear etiology  # Acute on chronic encephalopathy likely 2/2 infectious vs hepatic vs metabolic  # Hyperbilirubinemia  # Anasarca  # Hypoalbuminemia    Data:   -Steady rise in LFTs in 05/2021,  -12/02/21: AST 152--> 121, ALT 130--> 110, TB 5.4, Albumin 1.1  -Hepatitis panel negative.   -1/26:  Tbili 14.6, GGT 1160, ammonia  25  -CTAP 12/02/21: Scattered sclerotic lesions in the lumbar spine, hepatic steatosis.   -RUQ ultrasound: liver enlarged with diffuse increased echogenicity, consistent with fatty infiltration. 2cm stone in the gallbladder.   -HIDA scan: Normal radiotracer filling of the gallbladder is identified, therefore the exam is not consistent with cystic duct obstruction or acute cholecystitis.    [place] abd 1/27:   Hepatomegaly with diffusely increased echogenicity which can be seen with hepatic amyloidosis or steatosis. Please refer to the recently performed biopsy for further details.     2.  Mild perihepatic ascites.    3.  Patent hepatic vasculature with normal waveforms.    4.  Cholelithiasis without acute cholecystitis.    5.  Right pleural effusion.    Portal pressures:  Mean right atrial pressure (mmHg): 4  Mean inferior vena cava pressure (mmHg): 5  Mean free hepatic vein pressure (mmHg): 8   Mean wedged hepatic vein pressure (mmHg): 16     Plan:   []  Hepatology consult, follow up recs.   [ ]  Liver bx 1/27, follow up patholoy      # History of PE, diagnosed in April 2019,   - hold home apixiban for now given recent anemia and concerns for bleeding.    -monitor for signs and symptoms of worsening bleeding.     # Elevated TSH at OSH, likely 2/2 subclinical hypothyroidism  -repeat TSH when acute sickness resolved.     # Anxiety, emotional distress:   -Patient has stressful social situation, intellectually disabled son, and regarding underlying disease process.     #Chronic buttocks pressure wound  -Wound care consult, appreciate recs  -Position changes as tolerated, waffle cushion, etc    VTE PPx:  Contraindicated: Anticipated procedure        Severity of Illness                 During this hospitalization the patient is also being treated for:  - Sepsis (present on admission)    - Malignancy associated fatigue on admission  - Neoplasm/malignancy associated pain  - Hypoalbuminemia (  present on admission)  - Fluid  status: Hypovolemia on admission     - Cachexia  - Encephalopathy: Metabolic encephalopathy    Code Status: FULL    Ariel Amos, DO  12/12/2021

## 2021-12-13 LAB — POCT GLUCOSE
Glucose, Glucometer: 101 mg/dL (ref 70–199)
Glucose, Glucometer: 109 mg/dL (ref 70–199)
Glucose, Glucometer: 120 mg/dL (ref 70–199)
Glucose, Glucometer: 69 mg/dL — ABNORMAL LOW (ref 70–199)
Glucose, Glucometer: 92 mg/dL (ref 70–199)

## 2021-12-13 LAB — COMPLETE BLOOD COUNT WITH DIFFERENTIAL
Abs Basophils: 0 10*9/L (ref 0.00–0.10)
Abs Eosinophils: 0 10*9/L (ref 0.00–0.40)
Abs Imm Granulocytes: 0.02 10*9/L (ref <0.10)
Abs Lymphocytes: 0.38 10*9/L — ABNORMAL LOW (ref 1.00–3.40)
Abs Monocytes: 0.15 10*9/L — ABNORMAL LOW (ref 0.20–0.80)
Abs Neutrophils: 1.35 10*9/L — ABNORMAL LOW (ref 1.80–6.80)
Hematocrit: 30.5 % — ABNORMAL LOW (ref 36.0–46.0)
Hemoglobin: 10.2 g/dL — ABNORMAL LOW (ref 12.0–15.5)
MCH: 32.7 pg (ref 26.0–34.0)
MCHC: 33.4 g/dL (ref 31.0–36.0)
MCV: 98 fL (ref 80–100)
MPV: 13.4 fL — ABNORMAL HIGH (ref 9.1–12.6)
Platelet Count: 74 10*9/L — ABNORMAL LOW (ref 140–450)
RBC Count: 3.12 10*12/L — ABNORMAL LOW (ref 4.00–5.20)
RDW-CV: 21.9 % — ABNORMAL HIGH (ref 11.7–14.4)
WBC Count: 1.9 10*9/L — ABNORMAL LOW (ref 3.4–10.0)

## 2021-12-13 LAB — FERRITIN: Ferritin, Serum/Plasma: 2380 ug/L — ABNORMAL HIGH (ref 18–340)

## 2021-12-13 LAB — PROTHROMBIN TIME
INR: 1.3 — ABNORMAL HIGH (ref 0.9–1.2)
PT: 15.8 s — ABNORMAL HIGH (ref 11.6–15.0)

## 2021-12-13 LAB — URINALYSIS WITH MICROSCOPY
Glucose, (UA): NEGATIVE mg/dL
Hyaline Cast: 6
Ketones, UA: NEGATIVE mg/dL
Nitrite: NEGATIVE
Protein, UA: 30 mg/dL — AB
RBCs, urine: >50 /HPF — ABNORMAL HIGH (ref <3)
Specific Gravity: 1.022 (ref 1.002–1.030)
Squam Epith Cells: >10 /LPF
Urobilinogen: NEGATIVE mg/dL(EU/dL)
WBC Esterase: POSITIVE — AB
WBCs, UR: >50 /HPF — ABNORMAL HIGH (ref <5)
pH, UA: 6 (ref 4.5–8.0)

## 2021-12-13 LAB — COMPREHENSIVE METABOLIC PANEL (BMP, AST, ALT, T.BILI, ALKP, TP ALB)
AST: 96 U/L — ABNORMAL HIGH (ref 5–44)
Alanine transaminase: 103 U/L — ABNORMAL HIGH (ref 10–61)
Albumin, Serum / Plasma: 1.8 g/dL — ABNORMAL LOW (ref 3.4–4.8)
Alkaline Phosphatase: 598 U/L — ABNORMAL HIGH (ref 38–108)
Anion Gap: 7 (ref 4–14)
Bilirubin, Total: 14.1 mg/dL — ABNORMAL HIGH (ref 0.2–1.2)
Calcium, total, Serum / Plasma: 7.8 mg/dL — ABNORMAL LOW (ref 8.4–10.5)
Carbon Dioxide, Total: 20 mmol/L — ABNORMAL LOW (ref 22–29)
Chloride, Serum / Plasma: 115 mmol/L — ABNORMAL HIGH (ref 101–110)
Creatinine: 0.39 mg/dL — ABNORMAL LOW (ref 0.55–1.02)
Glucose, non-fasting: 92 mg/dL (ref 70–199)
Potassium, Serum / Plasma: 3.1 mmol/L — ABNORMAL LOW (ref 3.5–5.0)
Protein, Total, Serum / Plasma: 3.4 g/dL — ABNORMAL LOW (ref 6.3–8.6)
Sodium, Serum / Plasma: 142 mmol/L (ref 135–145)
Urea Nitrogen, Serum / Plasma: 9 mg/dL (ref 7–25)
eGFRcr: 113 mL/min/{1.73_m2} (ref >59)

## 2021-12-13 LAB — COVID-19 RNA, RT-PCR/NUCLEIC ACID AMPLIFICATION: COVID-19 RNA, RT-PCR/Nucleic Acid Amplification: NOT DETECTED

## 2021-12-13 LAB — C-REACTIVE PROTEIN: C-Reactive Protein: 11.3 mg/L — ABNORMAL HIGH (ref <5.1)

## 2021-12-13 LAB — SEDIMENTATION RATE: Sedimentation Rate: 5 mm/h (ref 0–15)

## 2021-12-13 MED ORDER — SODIUM CHLORIDE 0.9 % INTRAVENOUS PIGGYBACK
0.9 % | Freq: Once | INTRAVENOUS | Status: AC
Start: 2021-12-13 — End: 2021-12-13
  Administered 2021-12-13: 16:00:00 via INTRAVENOUS

## 2021-12-13 MED ORDER — PIPERACILLIN-TAZOBACTAM 4.5 GRAM INTRAVENOUS SOLUTION
4.5 gram | Freq: Three times a day (TID) | INTRAVENOUS | Status: AC
Start: 2021-12-13 — End: 2021-12-16
  Administered 2021-12-13 – 2021-12-16 (×9): 4.5 g via INTRAVENOUS

## 2021-12-13 MED ORDER — ACETAMINOPHEN 325 MG TABLET
325 | Freq: Once | ORAL | Status: DC
Start: 2021-12-13 — End: 2021-12-13

## 2021-12-13 MED ORDER — HYDROMORPHONE (PF) 0.5 MG/0.5 ML INJECTION SYRINGE
0.5 | Freq: Once | INTRAMUSCULAR | Status: AC
Start: 2021-12-13 — End: 2021-12-13
  Administered 2021-12-14: 08:00:00 via INTRAVENOUS

## 2021-12-13 MED FILL — POTASSIUM CHLORIDE 40 MEQ/100ML IN STERILE WATER INTRAVENOUS PIGGYBACK: 40 mEq/100 mL | INTRAVENOUS | Qty: 100

## 2021-12-13 MED FILL — DEXTROSE 50 % IN WATER (D50W) INTRAVENOUS SYRINGE: INTRAVENOUS | Qty: 50

## 2021-12-13 MED FILL — ACYCLOVIR SODIUM 50 MG/ML INTRAVENOUS SOLUTION: 50 mg/mL | INTRAVENOUS | Qty: 8

## 2021-12-13 MED FILL — METOCLOPRAMIDE 5 MG/ML INJECTION SOLUTION: 5 mg/mL | INTRAMUSCULAR | Qty: 2

## 2021-12-13 MED FILL — PIPERACILLIN-TAZOBACTAM 4.5 GRAM INTRAVENOUS SOLUTION: 4.5 gram | INTRAVENOUS | Qty: 4500

## 2021-12-13 NOTE — Consults (Signed)
PHYSICAL THERAPY INITIAL EVALUATION    PT relevant HPI  61 y/o female with h/o MM with lytic lesions in the L spine. Transferred from OSH after hypovolemic/distributive shock. Here for acute encephalopathy workup and acute liver failure workup.    ASSESSMENT Patient presents with deficits in strength (UE, LE, trunk), balance, and cognition (encephalopathic). Today she required assistx2 to perform bed mobility and participate in EOB activity. Prior to hospitalization she was independent without AD. Recommend continued IP PT to address deficits and maximize safety and independence with functional mobility. *She had an episode of coughing when drinking milkshake, notified RN and reached out to SLP*    Pt presents with PT impairments/problems in: aerobic capacity/ endurance, strength, cognition, balance, pain  Resulting in: functional decline from baseline, physical assist needed for mobility, inability to perform prior life roles, risk for falls  Factors facilitating functional progress: social support  Complicating factors for functional progress or safe discharge: co-morbidities  Anticipate patient can progress to: household ambulation with AD and supervision.    RECOMMENDATIONS  DISCHARGE RECOMMENDATION  Comments Placement with ongoing PT needs  Pending medical stability.   Patient Current Functional Status Sufficient for PT Discharge Recommendation Yes   Discharge DME needs TBD pending patient progress.   Discharge transportation needs [facility] Potential Patient participates well in therapy and progressing towards goals     NURSING RECOMMENDATIONS  Inpatient Rehab Assistive Device Recommendation Lateral transfer device;Neuro chair   Activity Recommendation bed in chair position, neuro chair     PRIOR LEVEL OF FUNCTION  Prior Living Environment: Pt lives in [address] [facility] with children in [place]. Bedroom on 2nd story. Has stairs to enter.  Available equipment or existing home modifications: No DME.  Prior functional  limitations: Independent without AD.  Social supports: Daughter  Community access:    Occupation(s), roles and routines, exercise habits:    Fall history: Fall History: No  Other comments: History provided by patient and then verified by daugther..    SUBJECTIVE  Subjective report:  I want ice cream!  Patient goals:  To eat ice cream  Notable observations:  End of session patient positioned supine in bed with HOB elevated, call bell within reach, and family at bedside. Handoff to RN.    SYSTEMS REVIEW  Cognition/Communication  Delirium screen: Yes Delirium Screen:   Details:  Encephalopathic    Cognition/communication impaired: Yes  Arousal comments: Awake and alert though at end of session became more tired/lethargic.  Communication: Difficulty making basic needs known, Difficulty making complex needs known  Communication comment or intervention : Delayed response to questions.  Cognition: Decreased attention, Delayed response, Impaired motor planning, Decreased safety awareness, Decreased insight to deficits  Behavior: Anxious, Agitated  Behavior comment: Anxious with movement. Became slightly agititated at EOB with 2nd person assist providing support from behind, stating "Go away!"  Integumentary  Integumentary deficits:    Cardiopulmonary  Cardiopulmonary deficits: Yes  Detail:  Decreased activity tolerance (fatigued after session).  Musculoskeletal  Musculoskeletal deficits: Musculoskeletal deficits noted: Yes  Abnormal strength findings: Did not perform formal MMT 2/2 difficulty performnig complex commands but overall demonstrates global weakness during functional mobility (ex lifting legs to don socks, performing bed mobility).  Neuromuscular  Neuromuscular deficits: Yes  Motor control or coordination: Impaired fine motor control use of hands.      Pain     Currently in pain: Yes  Pain location: Patient states that she has pain but is unable to describe where it is  located.          FUNCTIONAL  MOBILITY  Precautions/WB status: Yes  Precautions and weight bearing status comments: delrium, falls, h/o spinal lesions      Hemodynamic response:   Normal hemodynamic response: Yes  Response:     Comments: VSS, no evidence of orthostatic hypotension. SpO2 98%.    Requires second person/additional health care providers: Yes  Type additional health care provider: Rehab aide, OT    Bed mobility  Activity  Level of assist Current Device   Rolling maxA    Supine < > Sit maxAx2  Through long sitting;Bed rail;Head of bed elevated     Supine< >Sit: Bed mobility intervention: Verbal cues, Tactile cues, External cues, Comments: Attempted log roll though patient became very anxious. Instead performed pivot transfer. Patient required maxAx2 to perform supine>sit. Upon return to supine, patient self-selected log roll, laying down on her L elbow. Performed sit>supine log roll transfer with maxAx2.    Balance  Balance deficits noted: Yes  Functional Balance for ADLs  Position Static   Sit Static sitting level of assist: Moderate assist-       EOB activity: Patient tolerated sitting EOB for 8 minutes. Initially required modA for trunk support 2/2 balance deficits and impaired trunk strength. She then became agitated with having someone behind her to assist with balance, pushing back and saying "Go Away!" Redirected patient to hold therapist's hands in front, and she was able to maintain seated balance in this position without trunk support. Activity at EOB limited by perseverating on "ice cream" (patient's milkshake). BP stable throughout, no evidence of orthostatic hypotension.    Exercise/other interventions  Other exercise: Discussed with family: having curtains open during the day, closed at night. Performing hand opening/closing, elbow flexion/extension, and ankle pumps.   OTHER: At end of session patient was positioned upright in bed with HOB elevated about 70 degrees. Requested ice cream (her milkshake) and provided  milkshake with a straw. Patient drank but became sleepy and milkshake came out of mouth and then she started coughing. Coughing subsided in 2 minutes. SpO2 98%. Notified RN who states this happened earlier with water. Daughter also states she saw this happen earlier with water. Called and discussed with SLP.    Education assessment  Learner: Patient, Other (daughter, [name])  Content: Plan of care, Activity recommendations  Response: Verbalizes understanding, Needs reinforcement    Communication between other health care providers: RN;SLP  Communication comment: Per RN patient had fever last night and is more confused today. Discussed case and cleared to participate in PT. Also discussed patient's functional status, coughing after drinking milk shake.    Outcome measures   Physical Therapist Global Assessment of Mobility  Activity Achieved: Passive ROM  AMPAC 6-clicks basic mobility score: 10    PLAN  PT frequency:  5x/week  PT duration:  4 weeks.  Comment: POC comment: POC expires 2/26    GOALS  Flowsheet Rows    Flowsheet Row Most Recent Value   Patient will perform supine< >sitting minA with bed features   Patient will perform bed to chair transfers minA with [place]   Patient will ambulate x50 feet with FWW and minA   Patient will demonstrate understanding of Pacing strategies, Home activity program, Precautions for mobility   Patient/cargiver will be independent Aerobic exercise program, Walking program        Planned PT interventions:   Specific interventions: Progressive functional mobility training;Gait training;Balance training;NM Re-ed;Aerobic training;Ther ex  Education interventions:  Education interventions : Caregiver training;Benefits of activity;Self-pacing/breathing;Exercise program;Mobility with precautions compliance;Fall risk reduction  Comment:              Thalia Bloodgood, [place]    12/13/2021

## 2021-12-13 NOTE — Progress Notes (Addendum)
[facility] PROGRESS NOTE  ATTENDING ONLY     My date of service is 12/13/2021.    24 Hour Course  -Febrile to 101.3 ON, zosyn re-started.  Will get CT CAP w/ contrast today as we investigate etiologies of her fevers  -Tbili 14.1 this AM, other LFT's unchanged  -Went for transjugular liver bx with portal pressures, will follow up path  -Appreciate hepatology recs, studies pending  -Plt 74, Hgb 10.2  -ANC 1.59->1.35        Subjective  Patient seen and examined at bedside this AM.  In NAD.  Pleasant but with word finding difficulty. A bit more confused/lethargic than yesterday but able to follow simple commands.  Endorses nonspecific pain but otherwise ROS difficult to obtain 2/2 AMS.          Vitals  Temp:  [37.1 C (98.8 F)-38.6 C (101.5 F)] 37.3 C (99.1 F)  Heart Rate:  [80-86] 86  *Resp:  [19-32] 28  BP: (97-119)/(73-83) 119/83  SpO2:  [97 %-99 %] 99 %    MostRecent Weight: 88 kg (194 lb 0.1 oz)  Admission Weight: 79.6 kg (175 lb 7.8 oz)      Intake/Output Summary (Last 24 hours) at 12/13/2021 1148  Last data filed at 12/13/2021 1123  Gross per 24 hour   Intake 2970.18 ml   Output 750 ml   Net 2220.18 ml       Pain Score: 0    Physical Exam  Appearance: She is ill-appearing.      Comments: Alert and oriented X 1-2 slow to respond. Confused.    HENT:      Head: Normocephalic and atraumatic.      Nose: Nose normal.   Neck:  R IJ CVC with dressing in place, c/d/i  Eyes:      General: Scleral icterus present.      Extraocular Movements: Extraocular movements intact.      Conjunctiva/sclera: Conjunctivae normal.      Pupils: Pupils are equal, round, and reactive to light.   Cardiovascular:      Rate and Rhythm: Normal rate and regular rhythm.      Pulses: Normal pulses.      Heart sounds: Normal heart sounds. No murmur heard.    No friction rub. No gallop.   Pulmonary:      Effort: Pulmonary effort is normal. No respiratory distress.      Breath sounds: Normal breath sounds. No stridor. No  wheezing, rhonchi or rales.   Abdominal:      General: Abdomen is flat. Bowel sounds are normal. There is no distension.      Palpations: Abdomen is soft. There is no mass.      Tenderness: There is no abdominal tenderness. There is no guarding or rebound.      Hernia: No hernia is present.      Comments: Foley catheter in place, urine appears dark  No suprapubic tenderness.   Musculoskeletal:         General: Normal range of motion.      Cervical back: Normal range of motion and neck supple.      Right lower leg: Edema present.      Left lower leg: Edema present.      Comments: Strength 1/5 in the bilateral lower extremities, unable to raise them to gravity.   Pitting edema 3-4+ in the bilateral lower extremities.   Anasarca in the abdomen and thighs bilaterally.    Skin:  General: Skin is warm.      Capillary Refill: Capillary refill takes less than 2 seconds.      Coloration: Skin is jaundiced.      Findings: Bruising present.      Comments: ecchymoses on the sternum   Neurological:      Mental Status: She is alert. She is disoriented.      Motor: Weakness present.       Scheduled Meds:   0.9% sodium chloride flush  10-20 mL Intravenous Q12H [place]    0.9% sodium chloride flush  3 mL Intravenous Q12H [place]    acyclovir  250 mg/m2 (Adjusted) Intravenous Q12H (RESP)    piperacillin-tazobactam  4.5 g Intravenous Q8H    senna  17.2 mg Oral Daily At Bedtime [facility]     Continuous Infusions:   dextrose 5 % and 0.9 % sodium chloride 75 mL/hr (12/13/21 0700)     PRN Meds:   0.9% sodium chloride flush  10-20 mL Intravenous PRN    0.9% sodium chloride flush  3 mL Intravenous PRN    dextrose 50%  12.5 g Intravenous Q15 Min PRN    Or    glucose  20 g Oral Q15 Min PRN    Or    juice (for hypoglycemia)  16 g Oral Q15 Min PRN    magnesium sulfate in dextrose 5 %  2-4 g Intravenous Daily PRN    Or    magnesium sulfate in water  2-4 g Intravenous Daily PRN    metoclopramide  5-10 mg Intravenous Q8H PRN    ondansetron  8  mg Intravenous Q8H PRN    polyethylene glycol  17 g Oral Daily PRN    potassium chloride in sterile water  20-80 mEq Intravenous Daily PRN    Or    potassium chloride in sterile water  20-80 mEq Intravenous Daily PRN    Or    potassium chloride  20-80 mEq Oral Daily PRN       Data    CBC        12/13/21  0302 12/12/21  0329   WBC 1.9* 2.1*   HGB 10.2* 9.2*   HCT 30.5* 27.5*   PLT 74* 60*     Coags        12/13/21  0302 12/12/21  0329   INR 1.3* 1.4*     Chem7        12/13/21  0302 12/12/21  0329   NA 142 142   K 3.1* 3.3*   CL 115* 114*   CO2 20* 22   BUN 9 10   CREAT 0.39* 0.41*   GLU 92 68*     Electrolytes        12/13/21  0302 12/12/21  0329   CA 7.8* 7.5*     Liver Panel        12/13/21  0302 12/12/21  0329   AST 96* 108*   ALT 103* 96*   ALKP 598* 536*   TBILI 14.1* 13.6*   TP 3.4* 3.1*   ALB 1.8* 1.7*     Lactate  No results found in last 36 hours    Microbiology Results (last 24 hours)     Procedure Component Value Units Date/Time    Urine Culture [[id number]]     Order Status: Sent Specimen: Not Applicable from Urine, Indwelling Catheter Not in Vacutainer     Central Blood Culture [[id number]] Collected: 12/13/21 0351    Order Status: Sent  Specimen: Not Applicable from Central Blood Updated: 12/13/21 0759    Central Blood Culture [[id number]] Collected: 12/13/21 0350    Order Status: Sent Specimen: Not Applicable from Central Blood Updated: 12/13/21 0758    COVID-19 RNA, RT-PCR/Nucleic Acid Amplification Screening (Asymptomatic and No Specific COVID-19 Suspicion); No; No; No; No Procedure Planned; Unknown [[id number]] Collected: 12/12/21 2106    Order Status: Completed Specimen: Not Applicable from Bilateral Anterior Nares +/- OP Swab Updated: 12/13/21 0609     COVID-19 RNA, RT-PCR/Nucleic Acid Amplification Not detected     Comment: (Reported by Lab to Public Health.)  (Results reported to [facility])          Comments See Comment     Comment: Bilateral Anterior nares and OP Swab  in universal transport  medium         Peripheral Blood Culture [[id number]] Collected: 12/10/21 0209    Order Status: Completed Specimen: Not Applicable from Peripheral Blood Updated: 12/13/21 0553     Peripheral Blood Culture No growth 3 days.    Peripheral Blood Culture [[id number]] Collected: 12/10/21 0211    Order Status: Completed Specimen: Not Applicable from Peripheral Blood Updated: 12/13/21 0553     Peripheral Blood Culture No growth 3 days.    Cytomegalovirus DNA, Quantitative PCR, Plasma [[id number]] Collected: 12/13/21 0302    Order Status: Sent Specimen: Not Applicable from Serum Updated: 12/13/21 0413    Hepatitis E Antibody, IgM [[id number]] Collected: 12/13/21 0302    Order Status: Sent Specimen: Not Applicable from Serum Updated: 12/13/21 0413    Hepatitis B e Antibody [[id number]] Collected: 12/13/21 0302    Order Status: Sent Specimen: Blood from Serum Updated: 12/13/21 0413    Peripheral Blood Culture [[id number]]     Order Status: Canceled Specimen: Not Applicable from Peripheral Blood     Peripheral Blood Culture [[id number]]     Order Status: Canceled Specimen: Not Applicable from Peripheral Blood     Rapid HSV DNA, Skin Lesion / Blood [[id number]] Collected: 12/12/21 0331    Order Status: Completed Specimen: Blood, EDTA Tube Updated: 12/12/21 1317     HSV1 DNA Not detected     HSV2 DNA Not detected     Comments --     Reference value for all analytes: Not Detected.  This test is a modification of the FDA authorized assay. Performance characteristics have been determined by the [facility].      Varicella zoster virus DNA [[id number]] Collected: 12/12/21 0331    Order Status: Completed Specimen: Not Applicable from Blood, EDTA Tube Updated: 12/12/21 1316     Varicella Zoster DNA Not detected      Reference range: Not detected      This test was developed and its performance characteristics determined by the [facility].  It has not been cleared or approved by the U. S. Food and Drug  Administration.          Radiology Results   XR Chest 1 View (AP Portable)    Result Date: 12/13/2021  FINDINGS/IMPRESSION: Interval removal of right internal jugular venous catheter and placement of a left PICC with tip the cavoatrial junction. Increased hazy perihilar and basilar interstitial and airspace opacities likely reflecting atelectasis given very low lung volumes versus mild pulmonary edema. Small bilateral layering pleural effusions. No pneumothorax. Unchanged cardiac and mediastinal contours. 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 The patient has received VRd X 4 (achieving VGPR) then Mel 200 ASCT 05/12/11. 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 She met the criteria for PD on 02/19/2020 and was started on third line DARA PD in June 2021, which was recently held on 10/20/21 due to chronic diarrhea. Patient was transferred OSH after recent hypovolemic/distributive shock. Here for acute encephalopathy workup and acute liver failure workup.      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   June 2020 due to progression of M spike Revlimid was increased to 15 mg PO D1-21 every 28 days, plus dexamethasone  July 2021: started daratumumab, Pomylast 2 mg D 1-21, Q28 days, dexamethasone 12 mg PO Q weekly.   Primary oncologist: Dr. Iona Coach,  Dr. Kennon Rounds [facility]  Receiving dexamethasone 12 mg weekly for diarrhea  Receiving MT with daratumumab with pomalyst and dex, most recent dose 10/16/21, has since been on hold due to worsening diarrhea.     DATA:   09/07/2010: IgG Kappa MM  12/02/2021:  Ig M <5, IgG 447, IgA 20, Ig E <2, SFLC Kappa 16.6, Lambda 1.6, Kappa/Lamda 10.38    Chemo:   Holding Daratumumab and Pomalyst, last dose 10/20/21, given recent infection and acute liver failure.    Supportive Care:   Transfusion: Keep Hg >7, and Platelets >10.     Per daughter, has autoantibodies in blood was a difficult match. Would like to be contacted prior to transfusion.     Line: PICC placed 1/26    Outpatient Oncologist: Dr. Kennon Rounds    Dispo: TBD, after resolution of sepsis and liver failure      # Immunocompromise  # At risk for infections due to malignancy  # Shock, likely hypovolemic vs distributive in the setting of chronic diarrhea, + norovirus infection   # Acute on chronic diarrhea, norovirus positive., diff and OP negative.   -No prior evidence of adrenal insufficiency, AM cortisol levels normal, previously on dexamethasone 8 mg PO Qweekly, but has been held since early December.   - IV Zosyn 4.5 g Q8hrs (1/26-1/28, restarted 1/29 given recurrent fever)  -Monitor I/O's      OI ppx: Acyclovir IV    # Grade III neutropenia, at baseline  # Leukopenia  #Thrombocytopenia (possibly 2/2 liver disease)  Data: WBC of 2.2 on 12/05/21, on Zarxio at home  Plan: Continue to monitor    # Acute liver failure of unclear etiology  # Acute on chronic encephalopathy likely 2/2 infectious vs hepatic vs metabolic  # Hyperbilirubinemia  # Anasarca  # Hypoalbuminemia    Data:   -Steady rise in LFTs in 05/2021,  -12/02/21: AST 152--> 121, ALT 130--> 110, TB 5.4, Albumin 1.1  -Hepatitis panel negative.   -1/26:  Tbili 14.6, GGT 1160, ammonia 25  -CTAP 12/02/21: Scattered sclerotic lesions in the lumbar spine, hepatic steatosis.   -RUQ ultrasound: liver enlarged with diffuse increased echogenicity, consistent with fatty infiltration. 2cm stone in the gallbladder.   -HIDA scan: Normal radiotracer filling of the gallbladder is identified, therefore the exam is not consistent with cystic duct obstruction or acute  cholecystitis.    [place] abd 1/27:  Hepatomegaly with diffusely increased echogenicity which can be seen with hepatic amyloidosis or steatosis. Please refer to the recently performed biopsy for further details.     2.  Mild perihepatic ascites.    3.  Patent hepatic vasculature with normal waveforms.    4.  Cholelithiasis without acute cholecystitis.    5.  Right pleural effusion.    Portal pressures:  Mean right atrial pressure (mmHg): 4  Mean inferior vena cava pressure (mmHg): 5  Mean free hepatic vein pressure (mmHg): 8   Mean wedged hepatic vein pressure (mmHg): 16     Plan:   []  Hepatology consult, follow up recs.   [ ]  Liver bx 1/27, follow up patholoy      # History of PE, diagnosed in April 2019,   - hold home apixiban for now given recent anemia and concerns for bleeding.    -monitor for signs and symptoms of worsening bleeding.     # Elevated TSH at OSH, likely 2/2 subclinical hypothyroidism  -repeat TSH when acute sickness resolved.     # Anxiety, emotional distress:   -Patient has stressful social situation, intellectually disabled son, and regarding underlying disease process.     #Pressure wound ruled out, treating bilateral buttocks moisture and friction damage   -Wound care consult, appreciate wound care recs  -Position changes as tolerated, waffle cushion, etc    VTE PPx:  Contraindicated: Anticipated procedure        Severity of Illness                 During this hospitalization the patient is also being treated for:  - Sepsis (present on admission)    - Malignancy associated fatigue on admission  - Neoplasm/malignancy associated pain  - Hypoalbuminemia (present on admission)  - Fluid status: Hypovolemia on admission     - Cachexia  - Encephalopathy: Metabolic encephalopathy    Code Status: FULL    Zena Amos, DO  12/13/2021

## 2021-12-14 LAB — HEPATITIS A VIRUS ANTIBODY IGG: Hep A Ab IgG: POSITIVE — AB

## 2021-12-14 LAB — COMPREHENSIVE METABOLIC PANEL (BMP, AST, ALT, T.BILI, ALKP, TP ALB)
AST: 78 U/L — ABNORMAL HIGH (ref 5–44)
Alanine transaminase: 89 U/L — ABNORMAL HIGH (ref 10–61)
Albumin, Serum / Plasma: 1.6 g/dL — ABNORMAL LOW (ref 3.4–4.8)
Alkaline Phosphatase: 504 U/L — ABNORMAL HIGH (ref 38–108)
Anion Gap: 6 (ref 4–14)
Bilirubin, Total: 12.8 mg/dL — ABNORMAL HIGH (ref 0.2–1.2)
Calcium, total, Serum / Plasma: 7.7 mg/dL — ABNORMAL LOW (ref 8.4–10.5)
Carbon Dioxide, Total: 21 mmol/L — ABNORMAL LOW (ref 22–29)
Chloride, Serum / Plasma: 117 mmol/L — ABNORMAL HIGH (ref 101–110)
Creatinine: 0.43 mg/dL — ABNORMAL LOW (ref 0.55–1.02)
Glucose, non-fasting: 96 mg/dL (ref 70–199)
Potassium, Serum / Plasma: 3.5 mmol/L (ref 3.5–5.0)
Protein, Total, Serum / Plasma: 3 g/dL — ABNORMAL LOW (ref 6.3–8.6)
Sodium, Serum / Plasma: 144 mmol/L (ref 135–145)
Urea Nitrogen, serum/plasma: 8 mg/dL (ref 7–25)
eGFRcr: 111 mL/min/{1.73_m2} (ref >59)

## 2021-12-14 LAB — KAPPA AND LAMBDA FREE LIGHT CHAINS, SERUM
Kappa Light Chain, Serum, Free: 16.6 mg/L (ref 3.3–19.4)
Kappa/Lambda Ratio, Serum, Free: 6.15 — ABNORMAL HIGH (ref 0.26–1.65)
Lambda Light Chain, Serum, Free: 2.7 mg/L — ABNORMAL LOW (ref 5.7–26.3)

## 2021-12-14 LAB — URINALYSIS WITH MICROSCOPY
Glucose, (UA): 50 mg/dL — AB
Granular Cast: 0
Hyaline Cast: >10
Ketones, UA: NEGATIVE mg/dL
Nitrite: NEGATIVE
Protein, UA: 30 mg/dL — AB
RBCs, urine: >50 /HPF — ABNORMAL HIGH (ref <3)
Round Epith Cells: 6 /LPF
Specific Gravity: 1.034 — ABNORMAL HIGH (ref 1.002–1.030)
Squam Epith Cells: >10 /LPF
Urobilinogen: NEGATIVE mg/dL(EU/dL)
WBC Esterase: POSITIVE — AB
WBCs, UR: >50 /HPF — ABNORMAL HIGH (ref <5)
pH, UA: 6 (ref 4.5–8.0)

## 2021-12-14 LAB — COMPLETE BLOOD COUNT WITH DIFFERENTIAL
Abs Basophils: 0 10*9/L (ref 0.00–0.10)
Abs Eosinophils: 0 10*9/L (ref 0.00–0.40)
Abs Imm Granulocytes: 0.01 10*9/L (ref <0.10)
Abs Lymphocytes: 0.1 10*9/L — ABNORMAL LOW (ref 1.00–3.40)
Abs Monocytes: 0.05 10*9/L — ABNORMAL LOW (ref 0.20–0.80)
Abs Neutrophils: 1.13 10*9/L — ABNORMAL LOW (ref 1.80–6.80)
Elliptocytes: 2 (ref 0–1)
Hematocrit: 28.1 % — ABNORMAL LOW (ref 36.0–46.0)
Hemoglobin: 9.2 g/dL — ABNORMAL LOW (ref 12.0–15.5)
MCH: 32.7 pg (ref 26.0–34.0)
MCHC: 32.7 g/dL (ref 31.0–36.0)
MCV: 100 fL (ref 80–100)
MPV: 14.3 fL — ABNORMAL HIGH (ref 9.1–12.6)
Platelet Count: 69 10*9/L — ABNORMAL LOW (ref 140–450)
RBC Count: 2.81 10*12/L — ABNORMAL LOW (ref 4.00–5.20)
RDW-CV: 22.3 % — ABNORMAL HIGH (ref 11.7–14.4)
WBC Count: 1.3 10*9/L — CL (ref 3.4–10.0)

## 2021-12-14 LAB — HEPATITIS B SURFACE ANTIBODY, QUANTITATIVE: Hep B surf Ab, Quant: 12 m[IU]/mL

## 2021-12-14 LAB — PROTHROMBIN TIME
INR: 1.4 — ABNORMAL HIGH (ref 0.9–1.2)
PT: 16.5 s — ABNORMAL HIGH (ref 11.6–15.0)

## 2021-12-14 LAB — IRON, % SATURATION AND TRANSFERRIN OR TIBC (DEPENDING ON THE LAB)
% Saturation: 93 % — ABNORMAL HIGH (ref 10–47)
Iron, serum: 60 ug/dL (ref 39–179)
Transferrin, Serum/Plasma: 46 mg/dL — ABNORMAL LOW (ref 182–360)

## 2021-12-14 LAB — URINE CULTURE: Bacterial Culture, Urine w/o gram stain: >100000 — AB

## 2021-12-14 LAB — POCT GLUCOSE
Glucose, Glucometer: 108 mg/dL (ref 70–199)
Glucose, Glucometer: 125 mg/dL (ref 70–199)
Glucose, Glucometer: 68 mg/dL — ABNORMAL LOW (ref 70–199)
Glucose, Glucometer: 113 mg/dL (ref 70–199)

## 2021-12-14 LAB — HEPATITIS B SURFACE ANTIGEN: Hep B surf Ag: NEGATIVE

## 2021-12-14 LAB — IGG, SERUM: IgG, serum: 430 mg/dL — ABNORMAL LOW (ref 672–1760)

## 2021-12-14 LAB — HEPATITIS B CORE ANTIBODY, TOTAL: Hep B Core Ab Total: NEGATIVE

## 2021-12-14 MED ORDER — CEFTRIAXONE 1 GRAM SOLUTION FOR INJECTION
1 gram | Freq: Every day | INTRAMUSCULAR | Status: DC
Start: 2021-12-14 — End: 2021-12-14

## 2021-12-14 MED ORDER — HYDROMORPHONE (PF) 0.5 MG/0.5 ML INJECTION SYRINGE
0.5 | Freq: Once | INTRAMUSCULAR | Status: AC
Start: 2021-12-14 — End: 2021-12-14
  Administered 2021-12-15: 04:00:00 0.5 mg via INTRAVENOUS

## 2021-12-14 MED ORDER — IOHEXOL 350 MG IODINE/ML INTRAVENOUS SOLUTION
350 | Freq: Once | INTRAVENOUS | Status: AC
Start: 2021-12-14 — End: 2021-12-14
  Administered 2021-12-15: 02:00:00 120 mL via INTRAVENOUS

## 2021-12-14 MED FILL — PIPERACILLIN-TAZOBACTAM 4.5 GRAM INTRAVENOUS SOLUTION: 4.5 gram | INTRAVENOUS | Qty: 4500

## 2021-12-14 MED FILL — ACYCLOVIR SODIUM 50 MG/ML INTRAVENOUS SOLUTION: 50 mg/mL | INTRAVENOUS | Qty: 8

## 2021-12-14 MED FILL — ONDANSETRON HCL (PF) 4 MG/2 ML INJECTION SOLUTION: 4 mg/2 mL | INTRAMUSCULAR | Qty: 4

## 2021-12-14 MED FILL — DILAUDID (PF) 0.5 MG/0.5 ML INJECTION SYRINGE: 0.5 mg/ mL | INTRAMUSCULAR | Qty: 0.5

## 2021-12-14 MED FILL — DEXTROSE 50 % IN WATER (D50W) INTRAVENOUS SYRINGE: INTRAVENOUS | Qty: 50

## 2021-12-14 MED FILL — POTASSIUM CHLORIDE 40 MEQ/100ML IN STERILE WATER INTRAVENOUS PIGGYBACK: 40 mEq/100 mL | INTRAVENOUS | Qty: 100

## 2021-12-14 NOTE — Progress Notes (Signed)
[facility] PROGRESS NOTE  ATTENDING ONLY     My date of service is 12/14/21.    24 Hour Course  - Ucx repeat given UA contamination  - Bcx pending  - Cont on zosyn  - CTH ordered for AMS, not assessed prior. CT chest/A/P still pending.   -Tbili downtrending.   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      Comments: Alert and oriented X 1-2 slow to respond. Confused.    HENT:      Head: Normocephalic and atraumatic.      Nose: Nose normal.   Neck:  R IJ CVC with dressing in place, c/d/i  Eyes:      General: Scleral icterus present.      Extraocular Movements: Extraocular movements intact.      Conjunctiva/sclera: Conjunctivae normal.      Pupils: Pupils are equal, round, and reactive to light.   Cardiovascular:      Rate and Rhythm: Normal rate and regular rhythm.      Pulses: Normal pulses.      Heart sounds: Normal heart sounds. No murmur heard.    No friction rub. No gallop.   Pulmonary:      Effort: Pulmonary effort is normal. No respiratory distress.      Breath sounds: Normal breath sounds. No stridor. No wheezing, rhonchi or rales.   Abdominal:      General: Abdomen is flat. Bowel sounds are normal. There is no distension.      Palpations: Abdomen is soft. There is no mass.      Tenderness: There is no abdominal tenderness. There is no guarding or rebound.      Hernia: No hernia is present.       Comments: Foley catheter in place, urine appears dark  No suprapubic tenderness.   Musculoskeletal:         General: Normal range of motion.      Cervical back: Normal range of motion and neck supple.      Right lower leg: Edema present.      Left lower leg: Edema present.      Comments: Strength 1/5 in the bilateral lower extremities, unable to raise them to gravity.   Pitting edema 3-4+ in the bilateral lower extremities.   Anasarca in the abdomen and thighs bilaterally.    Skin:     General: Skin is warm.      Capillary Refill: Capillary refill takes less than 2 seconds.      Coloration: Skin is jaundiced.      Findings: Bruising present.      Comments: ecchymoses on the sternum   Neurological:  Mental Status: She is alert. She is disoriented.      Motor: Weakness present.       Scheduled Meds:   0.9% sodium chloride flush  10-20 mL Intravenous Q12H [place]    0.9% sodium chloride flush  3 mL Intravenous Q12H [place]    acyclovir  250 mg/m2 (Adjusted) Intravenous Q12H (RESP)    piperacillin-tazobactam  4.5 g Intravenous Q8H     Continuous Infusions:   dextrose 5 % and 0.9 % sodium chloride 75 mL/hr (12/14/21 1150)     PRN Meds:   0.9% sodium chloride flush  10-20 mL Intravenous PRN    0.9% sodium chloride flush  3 mL Intravenous PRN    dextrose 50%  12.5 g Intravenous Q15 Min PRN    Or    glucose  20 g Oral Q15 Min PRN    Or    juice (for hypoglycemia)  16 g Oral Q15 Min PRN    magnesium sulfate in dextrose 5 %  2-4 g Intravenous Daily PRN    Or    magnesium sulfate in water  2-4 g Intravenous Daily PRN    metoclopramide  5-10 mg Intravenous Q8H PRN    ondansetron  8 mg Intravenous Q8H PRN    polyethylene glycol  17 g Oral Daily PRN    potassium chloride in sterile water  20-80 mEq Intravenous Daily PRN    Or    potassium chloride in sterile water  20-80 mEq Intravenous Daily PRN    Or    potassium chloride  20-80 mEq Oral Daily PRN       Data    CBC        12/14/21  0305 12/13/21  0302   WBC 1.3*  1.9*   HGB 9.2* 10.2*   HCT 28.1* 30.5*   PLT 69* 74*     Coags        12/14/21  0305 12/13/21  0302   INR 1.4* 1.3*     Chem7        12/14/21  0305 12/13/21  0302   NA 144 142   K 3.5 3.1*   CL 117* 115*   CO2 21* 20*   BUN 8 9   CREAT 0.43* 0.39*   GLU 96 92     Electrolytes        12/14/21  0305 12/13/21  0302   CA 7.7* 7.8*     Liver Panel        12/14/21  0305 12/13/21  0302   AST 78* 96*   ALT 89* 103*   ALKP 504* 598*   TBILI 12.8* 14.1*   TP 3.0* 3.4*   ALB 1.6* 1.8*     Lactate  No results found in last 36 hours    Microbiology Results (last 24 hours)     Procedure Component Value Units Date/Time    Urine Culture [[id number]]  (Abnormal) Collected: 12/13/21 1329    Order Status: Completed Specimen: Not Applicable from Urine, Indwelling Catheter Not in Vacutainer Updated: 12/14/21 1213     Bacterial Culture, Urine w/o gram stain >100,000 Yeast isolated    Urine Culture [[id number]] Collected: 12/14/21 1145    Order Status: Sent Specimen: Not Applicable from Urine, Indwelling Catheter Not in Vacutainer     Hepatitis A virus Antibody IgG [[id number]]  (Abnormal) Collected: 12/11/21 1618    Order Status: Completed Specimen: Not Applicable from Serum Updated: 12/14/21 1006     Hep A Ab IgG  POS    Hepatitis B Surface Antibody, Quantitative [[id number]] Collected: 12/11/21 1618    Order Status: Completed Specimen: Not Applicable from Serum Updated: 12/14/21 1006     Hep B surf Ab, Quant 12 mIU/mL      Comment: Reference Range:  < 8 mIU/mL: Not Immune  8-11 mIU/mL: Equivocal, questionable immunity  12 mIU/mL or greater: Immune         Hepatitis B Surface Antigen [[id number]] Collected: 12/11/21 1618    Order Status: Completed Specimen: Blood from Serum Updated: 12/14/21 0959     Hep B surf Ag NEG    Hepatitis B Core Antibody, Total [[id number]] Collected: 12/11/21 1618    Order Status: Completed Specimen: Not Applicable from Serum Updated: 12/14/21 0959     Hep B Core Ab Total NEG    Central Blood Culture [[id number]]  Collected: 12/13/21 0350    Order Status: Completed Specimen: Not Applicable from Central Blood Updated: 12/14/21 0601     Central Blood Culture No growth at 22 hours.    Central Blood Culture [[id number]] Collected: 12/13/21 0351    Order Status: Completed Specimen: Not Applicable from Central Blood Updated: 12/14/21 0601     Central Blood Culture No growth at 22 hours.    Peripheral Blood Culture [[id number]] Collected: 12/10/21 0209    Order Status: Completed Specimen: Not Applicable from Peripheral Blood Updated: 12/14/21 0600     Peripheral Blood Culture No growth 4 days.    Peripheral Blood Culture [[id number]] Collected: 12/10/21 0211    Order Status: Completed Specimen: Not Applicable from Peripheral Blood Updated: 12/14/21 0600     Peripheral Blood Culture No growth 4 days.          Radiology Results  No results found.    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 The patient has received VRd X 4 (achieving VGPR) then Mel 200 ASCT 05/12/11. 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 She met the criteria for PD on 02/19/2020 and was started on third line DARA PD in June 2021, which was recently held on 10/20/21 due to chronic diarrhea. Patient was transferred OSH after recent hypovolemic/distributive shock. Here for acute encephalopathy workup and acute liver failure workup.      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   June 2020 due to progression of M spike Revlimid was increased to 15 mg PO D1-21 every 28 days, plus dexamethasone  July 2021: started daratumumab, Pomylast 2 mg D 1-21, Q28 days, dexamethasone 12 mg PO Q weekly.   Primary oncologist: Dr. Iona Coach,  Dr. Kennon Rounds [place] [facility]  Receiving  dexamethasone 12 mg weekly for diarrhea  Receiving MT with daratumumab with pomalyst and dex, most recent dose 10/16/21, has since been on hold due to worsening diarrhea.     DATA:   09/07/2010: IgG Kappa MM  12/02/2021: Ig M <5, IgG 447, IgA 20, Ig E <2, SFLC Kappa 16.6, Lambda 1.6, Kappa/Lamda 10.38  MM labs pending    Chemo:   Holding Daratumumab and Pomalyst, last dose 10/20/21, given recent infection and acute liver failure.    Supportive Care:   Transfusion: Keep Hg >7, and Platelets >10.  Per daughter, has autoantibodies in blood was a difficult match. Would like to be contacted prior to transfusion.     Line: PICC placed 1/26    Outpatient Oncologist: Dr. Kennon Rounds    Dispo: TBD, after resolution of sepsis and liver failure    # Immunocompromise  # At risk for infections due to malignancy  # Shock, likely hypovolemic vs distributive in the setting of chronic diarrhea, + norovirus infection   # Acute on chronic diarrhea, norovirus positive., diff and OP negative.   -No prior evidence of adrenal insufficiency, AM cortisol levels normal, previously on dexamethasone 8 mg PO Qweekly, but has been held since early December.     Data:  - Ucx: pending  - Bcx: pending  - CT chest/A/P pending    Plan:  - IV Zosyn 4.5 g Q8hrs (1/26-1/28, restarted 1/29 given recurrent fever)  -Monitor I/O's  - OI ppx: Acyclovir IV    # Grade III neutropenia, at baseline  # Leukopenia  #Thrombocytopenia (possibly 2/2 liver disease)  Data: WBC of 2.2 on 12/05/21, on Zarxio at home  Plan: Continue to monitor    # Acute liver failure of unclear etiology  # Acute on chronic encephalopathy likely 2/2 infectious vs hepatic vs metabolic  # Hyperbilirubinemia  # Anasarca  # Hypoalbuminemia    Data:   -Steady rise in LFTs in 05/2021,  -12/02/21: AST 152--> 121, ALT 130--> 110, TB 5.4, Albumin 1.1  -Hepatitis panel negative.   -1/26:  Tbili 14.6, GGT 1160, ammonia 25  -CTAP 12/02/21: Scattered sclerotic lesions in the lumbar spine,  hepatic steatosis.   -RUQ ultrasound: liver enlarged with diffuse increased echogenicity, consistent with fatty infiltration. 2cm stone in the gallbladder.   -HIDA scan: Normal radiotracer filling of the gallbladder is identified, therefore the exam is not consistent with cystic duct obstruction or acute cholecystitis.    [place] abd 1/27:   Hepatomegaly with diffusely increased echogenicity which can be seen with hepatic amyloidosis or steatosis. Please refer to the recently performed biopsy for further details.     2.  Mild perihepatic ascites.    3.  Patent hepatic vasculature with normal waveforms.    4.  Cholelithiasis without acute cholecystitis.    5.  Right pleural effusion.    Portal pressures:  Mean right atrial pressure (mmHg): 4  Mean inferior vena cava pressure (mmHg): 5  Mean free hepatic vein pressure (mmHg): 8   Mean wedged hepatic vein pressure (mmHg): 16     - Follow-up with the pending studies:  Ferritin and iron studies  HEV IgG/IgM  PCR forVZV, HSV,CMV,EBV  Please send AMA,IgG4  Hepatitis A IgG  Hepatitis B core Ab, surface Ab     Plan:   [ ]  Hepatology consult, follow up recs.   [ ]  Liver bx 1/27, follow up patholoy  - daily LFTS/INR    #AMS  In the setting of liver failure, ammonia only 25.     Data:   1/31: HIV, TSH, Folate, B12 pending    Plan:  - CT head pending  - sending reversible causes of dementia w/up  - consider neurology c/s    # History of PE, diagnosed in April 2019,   - hold home apixiban for now given recent anemia and concerns for bleeding.    -monitor for signs and symptoms of worsening bleeding.     # Elevated TSH at OSH, likely 2/2 subclinical hypothyroidism  -repeat TSH when acute sickness resolved.     #  Anxiety, emotional distress:   -Patient has stressful social situation, intellectually disabled son, and regarding underlying disease process.     #Chronic buttocks pressure wound  -Wound care  consult, appreciate recs  -Position changes as tolerated, waffle cushion, etc    #VTE PPx:  Contraindicated: Anticipated procedure    Severity of Illness     During this hospitalization the patient is also being treated for:  - Sepsis (present on admission)    - Malignancy associated fatigue on admission  - Neoplasm/malignancy associated pain  - Hypoalbuminemia (present on admission)  - Fluid status: Hypovolemia on admission     - Cachexia  - Encephalopathy: Metabolic encephalopathy    Code Status: FULL    Farrel Conners, MD  12/14/21

## 2021-12-14 NOTE — Nursing Note (Addendum)
CT head ordered this afternoon. Escalated to STAT d/t new confusion on admission. Confirmed with CT tech CT for head, chest, and abd/pelvis orders in place. However, when pt returned to floor CT tech notified me that [facility] not complete. Escalted to MD. Per covering MD ok to wait until following day to complete CTH; I reinforced that [facility] should be completed tonight d/t confusion and no CTH on admission. MD attempted to reach CT again, but attempts to reach CT have been unsuccessful.

## 2021-12-14 NOTE — Consults (Addendum)
Treatment Note    Interval History          Subjective  Subjective  Patient subjective: (P) Patient recieved resting. Active participant in PO trials but endorsed unspecified pain and intermittent confusion. Appeared to recognize SLP.  Currently in Pain  Currently in Pain: (P) Yes (unspecified)    Objective  Short term goal(s) progress: SLP following for dysphagia tx. Per PT report of observed coughing on PO (milkshake) when sleepy, asked to re-eval swallowing status. PO trials of thins via tsp and straw, smooothie via straw complete. Patient demonstrated delayed dry throat clear/ cough on first trial apple juice by straw, but no other s/sx aspiration across any other trials of thins or solids. Patient endorsed dry throat and being very thirsty. No anterior spillage or clearance issues noted. Did demonstrate overall general body weakness and fatigue (i.e. difficulty holding cup independantly) as well as need for reorientation to task intermittently. Appeared to recognize SLP stating "you, you're the one. you're the best". When asked if she remebered SLP she endorsed "yes".    Assessment  Assessment  Impressions: Pt demonstrated suspected functional swallow with mild delayed throat clear on one trial thins likely d/t dry throat (endorsed by patient). No other s/sx asp observered across any PO trials (thins/solids). Prior coughing episodes reported from PT likely d/t AMS and fatigue. Rec continue on reg diet with thins. Rec continued 1:1 assist on all PO d/t AMS and general body fatigue iso complex medical course. SLP to s/o and d/c from services. Re-consult if needed.  Current Functional Level  Domain: Swallowing  Swallowing:  <Level 6>  Within functional limits/modified independence (Although functional swallow, remain 1:1 assist d/t AMS.)       Recommendations  Swallowing Recommendations  Solids: Regular  Liquids: Level 0 (Thin)  Liquid administration: Spoon;Straw (Cup seems too hard to control d/t AMS)  Medication  administration: Whole with liquid (As patient tollerates.)  Supervision during meals: 1:1 assistance  Mealtime strategies and positioning: (P) Upright;Small bites;Remain upright 30 minutes after eating  Meal strategy comment(s): (P) 1:1 assist. Only feed when ALERT and AWAKE. Gen Asp precautions cont. re: AMS.    Plans and Goals  Plan: (P) Discharge from SLP services in the acute setting - Re-consult as needed    There were no vitals filed for this visit.    Damien Fusi  12/14/2021

## 2021-12-14 NOTE — Consults (Signed)
PHYSICAL THERAPY TREATMENT NOTE    PT relevant HPI  61 y/o female with h/o MM with lytic lesions in the L spine. Transferred from OSH after hypovolemic/distributive shock. Here for acute encephalopathy workup and acute liver failure workup.    ASSESSMENT  Today patient able to tolerate sitting EOB for more time and demonstrated improved seated balance. While maintaining trunk control though she is limited in use of UEs to perform ADLs (maxA for washing face and combing hair). While strength and balance deficits are limiting functional mobility, cognitive deficits are also contributing to impaired function. Currently requires maxAx2 for all mobility. Recommend continued skilled IP PT to maximize safety and independence with functional mobility.    Focus next session: seated EOB activities: balance/trunk control exercises    RECOMMENDATIONS  DISCHARGE RECOMMENDATION  Comments Placement with ongoing PT needs  Pending medical stability.   Patient Current Functional Status Sufficient for PT Discharge Recommendation Yes   Discharge DME needs TBD pending patient progress.   Discharge transportation needs [facility] Patient participates well in therapy and progressing towards goals     NURSING RECOMMENDATIONS  Inpatient Rehab Assistive Device Recommendation Lateral transfer device;Neuro chair   Activity Recommendation bed in chair position, neuro chair     SUBJECTIVE  Subjective report:  I want to sit! Let go!  Notable observations:  End of session patient positioned supine in bed with HOB elevated, call bell within reach, and family at bedside. Handoff to RN.    SYSTEMS REVIEW  Cognition/Communication  Delirium screen:  Yes Delirium Screen:   Details:  Encephalopathic    Communication  Cognition/communication impaired:  Yes  Arousal comments: Awake and alert though at end of session became more tired/lethargic.  Communication: Difficulty making basic needs known, Difficulty making complex needs  known  Communication comment or intervention : Delayed response to questions.  Cognition: Decreased attention, Delayed response, Impaired motor planning, Decreased safety awareness, Decreased insight to deficits  Cognition comment: Intermittent and inconsistant command following, fluctuating with level of alertness.  Behavior: Anxious  Behavior comment: Anxious with movement.    Cardiopulmonary  Cardiopulmonary deficits:  Yes  Detail:  Decreased activity tolerance (fatigued after session).    Musculoskeletal  Musculoskeletal deficits:  Yes  Abnormal strength findings: Did not perform formal MMT 2/2 difficulty performnig complex commands but overall demonstrates global weakness during functional mobility (ex lifting legs to don socks, performing bed mobility).    Neuromuscular  Neuromuscular deficits:  Yes  Motor control or coordination: Impaired fine motor control use of hands.        Pain     Currently in pain: Yes  Pain location: Patient states that she has pain but is unable to describe where it is located.          COMPREHENSIVE MOVEMENT ANALYSIS/TREATMENT  Precautions/WB status: Yes  Precautions and weight bearing status comments: delrium, falls, h/o spinal lesions      Functional Mobility  Requires second person/additional health care providers: Yes  Type additional health care provider: Rehab aide, OT    Bed mobility Current Initial    Supine < > Sit  Level of assist     Device  Through long sitting;Bed rail;Head of bed elevated  Through long sitting;Bed rail;Head of bed elevated (12/13/21 1430)   Intervention  Verbal cues;Tactile cues;External cues  Verbal cues;Tactile cues;External cues (12/13/21 1430)   Supine<>sit comments:   Long sit transfer at EOB, able to initiate moving LEs towards EOB though requires maxA  for LE management. For supine>sit required maxAx2. Upon return to supine, patient self-selected log roll, laying down on her L elbow. Performed sit>supine log roll transfer with  maxAx2.    Balance  Balance deficits noted: Yes  Functional Balance for ADLs  Position Static Dynamic   Sit Static sitting level of assist: Contact guard assist-     Dynamic sitting level of assist: Contact guard assist-        Stand  -      -      Seated EOB patient maintained balance with CGA, improved from yesterday. Trialed ADLs at EOB including washing face, brushing hair but limited by attention and UE weakness. Tolerated sitting EOB for 15 minutes.    Communication between other health care providers: Communication between other health care provider: RN  Communication comment: patient's functional status     Education assessment  Learner: Patient, Other (daughter)  Content: Plan of care, Activity recommendations  Response: Verbalizes understanding, Needs reinforcement  Outcome measures   Physical Therapist Global Assessment of Mobility  Activity Achieved: Passive ROM  AMPAC 6-clicks basic mobility score: 7      PLAN  Plan of care status:  Current plan of care remains appropriate  PT frequency:  5x/week  PT duration:  4 weeks.  Comment:  POC expires 2/26      Planned PT interventions:   Specific interventions: Progressive functional mobility training;Gait training;Balance training;NM Re-ed;Aerobic training;Ther ex  Education interventions: Caregiver training;Benefits of activity;Self-pacing/breathing;Exercise program;Mobility with precautions compliance;Fall risk reduction  Comment:          Thalia Bloodgood, PT    12/14/2021

## 2021-12-15 LAB — COMPLETE BLOOD COUNT WITH DIFFERENTIAL
Abs Basophils: 0 10*9/L (ref 0.00–0.10)
Abs Eosinophils: 0 10*9/L (ref 0.00–0.40)
Abs Imm Granulocytes: 0.01 10*9/L (ref <0.10)
Abs Lymphocytes: 0.19 10*9/L — ABNORMAL LOW (ref 1.00–3.40)
Abs Monocytes: 0.1 10*9/L — ABNORMAL LOW (ref 0.20–0.80)
Abs Neutrophils: 0.69 10*9/L — CL (ref 1.80–6.80)
Hematocrit: 26.3 % — ABNORMAL LOW (ref 36.0–46.0)
Hemoglobin: 8.7 g/dL — ABNORMAL LOW (ref 12.0–15.5)
MCH: 33.5 pg (ref 26.0–34.0)
MCHC: 33.1 g/dL (ref 31.0–36.0)
MCV: 101 fL — ABNORMAL HIGH (ref 80–100)
MPV: 14 fL — ABNORMAL HIGH (ref 9.1–12.6)
Platelet Count: 62 10*9/L — ABNORMAL LOW (ref 140–450)
RBC Count: 2.6 10*12/L — ABNORMAL LOW (ref 4.00–5.20)
RDW-CV: 22.5 % — ABNORMAL HIGH (ref 11.7–14.4)
WBC Count: 1 10*9/L — CL (ref 3.4–10.0)

## 2021-12-15 LAB — COMPREHENSIVE METABOLIC PANEL (BMP, AST, ALT, T.BILI, ALKP, TP ALB)
AST: 77 U/L — ABNORMAL HIGH (ref 5–44)
Alanine transaminase: 83 U/L — ABNORMAL HIGH (ref 10–61)
Albumin, Serum / Plasma: 1.5 g/dL — ABNORMAL LOW (ref 3.4–4.8)
Alkaline Phosphatase: 475 U/L — ABNORMAL HIGH (ref 38–108)
Anion Gap: 5 (ref 4–14)
Bilirubin, Total: 12.5 mg/dL — ABNORMAL HIGH (ref 0.2–1.2)
Calcium, total, Serum / Plasma: 7.6 mg/dL — ABNORMAL LOW (ref 8.4–10.5)
Carbon Dioxide, Total: 22 mmol/L (ref 22–29)
Chloride, Serum / Plasma: 116 mmol/L — ABNORMAL HIGH (ref 101–110)
Creatinine: 0.43 mg/dL — ABNORMAL LOW (ref 0.55–1.02)
Glucose, non-fasting: 84 mg/dL (ref 70–199)
Potassium, Serum / Plasma: 3.1 mmol/L — ABNORMAL LOW (ref 3.5–5.0)
Protein, Total, Serum / Plasma: 2.9 g/dL — ABNORMAL LOW (ref 6.3–8.6)
Sodium, Serum / Plasma: 143 mmol/L (ref 135–145)
Urea Nitrogen, Serum / Plasma: 8 mg/dL (ref 7–25)
eGFRcr: 111 mL/min/{1.73_m2} (ref >59)

## 2021-12-15 LAB — PROTHROMBIN TIME
INR: 1.5 — ABNORMAL HIGH (ref 0.9–1.2)
PT: 17 s — ABNORMAL HIGH (ref 11.6–15.0)

## 2021-12-15 LAB — TYPE AND SCREEN
ABO/RH(D): O POS
Antibody Screen: POSITIVE

## 2021-12-15 LAB — IMMUNOFIXATION ELECTROPHORESIS, SERUM

## 2021-12-15 LAB — MITOCHONDRIAL ANTIBODY: Mitochondrial Antibody: <8 UNITS (ref <20.1)

## 2021-12-15 LAB — CYTOMEGALOVIRUS DNA, QUANTITATIVE PCR, PLASMA
CMV DNA (IU/mL): 443961 IU/mL — ABNORMAL HIGH (ref <30)
CMV DNA (Log IU/mL): 5.65 — ABNORMAL HIGH (ref <1.48)

## 2021-12-15 LAB — POCT GLUCOSE
Glucose, Glucometer: 86 mg/dL (ref 70–199)
Glucose, Glucometer: 72 mg/dL (ref 70–199)
Glucose, Glucometer: 75 mg/dL (ref 70–199)

## 2021-12-15 LAB — VITAMIN B12: Vitamin B12: 871 ng/L — ABNORMAL HIGH (ref 301–816)

## 2021-12-15 LAB — HIV ANTIBODY AND ANTIGEN COMBINATION TEST: HIV Ag/Ab Combo: NEGATIVE

## 2021-12-15 LAB — THYROID STIMULATING HORMONE: Thyroid Stimulating Hormone: 3.98 mIU/L (ref 0.45–4.12)

## 2021-12-15 LAB — AMMONIA: Ammonia: 26 umol/L (ref 18–72)

## 2021-12-15 MED ORDER — HYDROMORPHONE (PF) 0.5 MG/0.5 ML INJECTION SYRINGE
0.50.5 mg/ mL | INTRAMUSCULAR | Status: DC | PRN
Start: 2021-12-15 — End: 2022-01-15
  Administered 2021-12-16: 10:00:00 0.3 mg via INTRAVENOUS
  Administered 2021-12-16: 23:00:00 0.5 mg via INTRAVENOUS
  Administered 2021-12-16: 03:00:00 0.3 mg via INTRAVENOUS
  Administered 2021-12-17 – 2021-12-18 (×6): via INTRAVENOUS
  Administered 2021-12-19: 20:00:00 0.3 mg via INTRAVENOUS
  Administered 2021-12-19 – 2021-12-20 (×4): via INTRAVENOUS
  Administered 2021-12-20: 22:00:00 0.8 mg via INTRAVENOUS
  Administered 2021-12-20 – 2021-12-21 (×9): via INTRAVENOUS
  Administered 2021-12-22: 0.3 mg via INTRAVENOUS
  Administered 2021-12-22 (×2): 0.5 mg via INTRAVENOUS
  Administered 2021-12-22: 01:00:00 via INTRAVENOUS
  Administered 2021-12-22 – 2021-12-28 (×11): 0.5 mg via INTRAVENOUS
  Administered 2021-12-29: 23:00:00 0.2 mg via INTRAVENOUS
  Administered 2021-12-29 (×2): 0.3 mg via INTRAVENOUS
  Administered 2021-12-29 – 2022-01-03 (×3): 0.5 mg via INTRAVENOUS
  Administered 2022-01-03 (×2): via INTRAVENOUS
  Administered 2022-01-04 – 2022-01-15 (×12): 0 mg via INTRAVENOUS

## 2021-12-15 MED ORDER — FUROSEMIDE 10 MG/ML INJECTION SOLUTION
10 | Freq: Once | INTRAMUSCULAR | Status: AC
Start: 2021-12-15 — End: 2021-12-15
  Administered 2021-12-15: 19:00:00 20 mg via INTRAVENOUS

## 2021-12-15 MED ORDER — HYDROMORPHONE (PF) 0.5 MG/0.5 ML INJECTION SYRINGE
0.5 | INTRAMUSCULAR | Status: DC | PRN
Start: 2021-12-15 — End: 2021-12-15
  Administered 2021-12-15 (×2): via INTRAVENOUS

## 2021-12-15 MED ORDER — SALIVA SUBSTITUTE COMBO NO.9 MOUTHWASH
Freq: Four times a day (QID) | Status: DC
Start: 2021-12-15 — End: 2022-01-15
  Administered 2021-12-16 – 2021-12-23 (×22)
  Administered 2021-12-23 – 2021-12-24 (×4): 5 mL
  Administered 2021-12-24: 01:00:00
  Administered 2021-12-25 (×4): 5 mL
  Administered 2021-12-26: 05:00:00
  Administered 2021-12-26 – 2021-12-28 (×3): 5 mL
  Administered 2021-12-28: 21:00:00
  Administered 2021-12-28 – 2021-12-31 (×7): 5 mL
  Administered 2021-12-31
  Administered 2022-01-01 – 2022-01-04 (×6): 5 mL
  Administered 2022-01-04: 01:00:00
  Administered 2022-01-05 – 2022-01-06 (×5): 5 mL
  Administered 2022-01-06 – 2022-01-08 (×4)
  Administered 2022-01-08: 05:00:00 5 mL
  Administered 2022-01-09 (×2)
  Administered 2022-01-10: 06:00:00 5 mL
  Administered 2022-01-10 – 2022-01-11 (×3)
  Administered 2022-01-11 – 2022-01-15 (×2): 5 mL

## 2021-12-15 MED ORDER — MICAFUNGIN 100 MG INTRAVENOUS SOLUTION
100 mg | INTRAVENOUS | Status: AC
Start: 2021-12-15 — End: 2021-12-18
  Administered 2021-12-16: 01:00:00 100 mg via INTRAVENOUS
  Administered 2021-12-17 – 2021-12-18 (×2): via INTRAVENOUS

## 2021-12-15 MED ORDER — DEXTROSE 5 % IN WATER (D5W) INTRAVENOUS SOLUTION
Freq: Two times a day (BID) | INTRAVENOUS | Status: DC
Start: 2021-12-15 — End: 2022-01-05
  Administered 2021-12-15 – 2021-12-22 (×14): via INTRAVENOUS
  Administered 2021-12-22 – 2021-12-27 (×10): 400 mg/kg via INTRAVENOUS
  Administered 2021-12-27: 18:00:00 via INTRAVENOUS
  Administered 2021-12-28 – 2021-12-29 (×3): 400 mg/kg via INTRAVENOUS
  Administered 2021-12-29: 04:00:00 via INTRAVENOUS
  Administered 2021-12-30: 18:00:00 400 mg/kg via INTRAVENOUS
  Administered 2021-12-30 – 2021-12-31 (×2): via INTRAVENOUS
  Administered 2021-12-31 – 2022-01-01 (×2): 400 mg/kg via INTRAVENOUS
  Administered 2022-01-01: 04:00:00 via INTRAVENOUS
  Administered 2022-01-02 – 2022-01-05 (×8): 400 mg/kg via INTRAVENOUS

## 2021-12-15 MED FILL — ACYCLOVIR SODIUM 50 MG/ML INTRAVENOUS SOLUTION: 50 mg/mL | INTRAVENOUS | Qty: 8

## 2021-12-15 MED FILL — DILAUDID (PF) 0.5 MG/0.5 ML INJECTION SYRINGE: 0.5 mg/ mL | INTRAMUSCULAR | Qty: 0.5

## 2021-12-15 MED FILL — POTASSIUM CHLORIDE 40 MEQ/100ML IN STERILE WATER INTRAVENOUS PIGGYBACK: 40 mEq/100 mL | INTRAVENOUS | Qty: 100

## 2021-12-15 MED FILL — PIPERACILLIN-TAZOBACTAM 4.5 GRAM INTRAVENOUS SOLUTION: 4.5 gram | INTRAVENOUS | Qty: 4500

## 2021-12-15 MED FILL — FUROSEMIDE 10 MG/ML INJECTION SOLUTION: 10 mg/mL | INTRAMUSCULAR | Qty: 2

## 2021-12-15 MED FILL — GANCICLOVIR SODIUM 500 MG INTRAVENOUS SOLUTION: 500 mg | INTRAVENOUS | Qty: 8

## 2021-12-15 NOTE — Consults (Signed)
INFECTIOUS DISEASES INITIAL CONSULT NOTE - ATTENDING ONLY     My date of service is 12/15/2021.    Consult requested by Dr. Boyd Kerbs of the [facility] service for evaluation of CMV.    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     She has no history of CMV viremia or disease.     Timeline:  - 1/18: video visit with onc: reported watery diarrhea since 06/2021 in context of 09/2021 stool studies +norovirus; worsening fatigue rendering her unable to sit up in bed, confusion, all worse over preceding week. Instructed to present to [facility] ED.   - 1/18-1/25: [facility] admission: admitted with hypovolemia, fevers, and shock with lactate of 11, abnormal LFTs requiring ICU admission and phenylephrine 1/19-1/20. Pancytopenic, +norovirus from stool. Imaging including MRI/MRCP, abdominal US, HIDA scan without clear cause for LFT abnormalities. LFTs AST 118, [name], alk phos 958, tbili 12.1 at time of transfer to [facility] for work-up for ALF.   - 1/25: transferred to [place]   - 1/26: seen by hepatology: plan for liver biopsy, send viral PCRs including CMV, do not think confusion is likely to be hepatic encephalopathy  - 1/27: transjugular liver biopsy, path pending  - 1/30: CMV from 1/28 resulted at 444K    Tmax here 38.6 on 1/28, AF since. Other VSS; on RA. WBC 1 with ANC 0.69, Hgb 8.7, plts 62; tbili is still 12.5. Cr is normal. CTAP showed enterocolitis. On ID interview, she denies HA, neck stiffness, vision changes, abdominal pain, cough, shortness of breath.     Past Medical History:   Diagnosis Date    Acid reflux disease     Anxiety      Depression     Hereditary angioedema (CMS code)     Multiple myeloma, without mention of having achieved remission     IgG kappa    Myeloma (CMS code) 05/10/2011     Past Surgical History:   Procedure Laterality Date    AUTOLOGOUS STEM CELL TRANSPLANTATION  05/12/11    BREAST CYST EXCISION  1981       Allergies/Contraindications   Allergen Reactions    Caffeine Other (See Comments)     jitters    Diphenoxylate-Atropine Other (See Comments)     Severe stomach pain         Medications  Scheduled Meds:   0.9% sodium chloride flush  10-20 mL Intravenous Q12H [place]    0.9% sodium chloride flush  3 mL Intravenous Q12H [place]    acyclovir  250 mg/m2 (Adjusted) Intravenous Q12H (RESP)    dry mouth oral rinse  5 mL Mucous Membrane 4x Daily [place]    piperacillin-tazobactam  4.5 g Intravenous Q8H     Continuous Infusions:   dextrose 5 % and 0.9 % sodium chloride 75 mL/hr (12/15/21 0052)       Antibiotic History  Pip-tazo 1/18-1/22, 1/24-1/27, 1/29-    Prior:  CTX 1/19  Vanc 1/18-1/19    High Medication Toxicity Risk: Yes    Social History     Socioeconomic History    Marital status: Married   Tobacco Use  Smoking status: Unknown   Substance and Sexual Activity    Sexual activity: Not Currently     Family History was reviewed and is non-contributory to this illness.    Review of Systems  Constitutional: as per HI   Eyes: Negative for blurred vision.   Respiratory: Negative for cough and shortness of breath.    Cardiovascular: Negative for chest pain and palpitations.   Gastrointestinal: Negative for abdominal pain.   Genitourinary: Negative for dysuria.   Skin: Negative for rash.   Neurological: Negative for dizziness.   Endo/Heme/Allergies: Does not bruise/bleed easily.   Psychiatric/Behavioral: Negative for depression.   All other systems reviewed and are negative.    Vitals  Temp:  [36 C (96.8 F)-37.7 C (99.9 F)] 36.6 C (97.9 F)  Heart Rate:  [63-87] 72  *Resp:  [16-22] 20  BP: (94-114)/(62-78) 102/71  SpO2:   [95 %-98 %] 96 %      Intake/Output Summary (Last 24 hours) at 12/15/2021 1104  Last data filed at 12/15/2021 0920  Gross per 24 hour   Intake 1814 ml   Output 1400 ml   Net 414 ml       Physical Examination  Gen: Lying in bed in NAD, falling asleep during interview. Jaundiced.  HEENT: Sclera icteric, conjunctiva noninjected. Mucous membranes dry. No oral lesions.   Neck: Supple. No cervical LAD.   CV: RRR, S1/S2, no m/r/g  Pulm: CTAB. No rhonchi or wheeze.   Abd: +BS. Soft, NTND, no rebound/guarding  Ext: WWP, no c/c/e  Skin: No rash  Neuro: Alert and oriented to person only - says she is in a store when given multiple choices, does not know year. Repeating answers to prior questions. No focal neuro deficits.     Lines/tubes: LUE PICC    Data    CBC        12/15/21  0248 12/14/21  0305 12/13/21  0302   WBC 1.0* 1.3* 1.9*   HGB 8.7* 9.2* 10.2*   HCT 26.3* 28.1* 30.5*   PLT 62* 69* 74*   NEUTA 0.69* 1.13* 1.35*   LYMA 0.19* 0.10* 0.38*   MOA 0.10* 0.05* 0.15*   EOA 0.00 0.00 0.00   BASOA 0.00 0.00 0.00       Chem7        12/15/21  0248 12/14/21  0305 12/13/21  0302   NA 143 144 142   K 3.1* 3.5 3.1*   CL 116* 117* 115*   CO2 22 21* 20*   BUN 8 8 9    CREAT 0.43* 0.43* 0.39*   GLU 84 96 92       Liver Panel        12/15/21  0248 12/14/21  0305 12/13/21  0302 12/11/21  0328 12/10/21  0208   AST 77* 78* 96*   < > 101*   ALT 83* 89* 103*   < > 116*   ALKP 475* 504* 598*   < > 745*   TBILI 12.5* 12.8* 14.1*   < > 14.6*   TP 2.9* 3.0* 3.4*   < > 3.2*   ALB 1.5* 1.6* 1.8*   < > 2.0*   GGT  --   --   --   --  1,160*    < > = values in this interval not displayed.       CRP        12/13/21  0302   CRP 11.3*  CMV PCR        12/13/21  0302   CMVQT 127,517*           Microbiology    Microbiology results - 7 Days   Microbiology Results   Date Collected Specimen Source Result   12/10/2021 12:04 AM MRSA Culture Anterior Nares Swab  Methicillin Resistant Staph Aureus Screen No Methicillin resistant Staphylococcus aureus  (MRSA)isolated.     12/10/2021 12:04 AM COVID-19 RNA, RT-PCR/Nucleic Acid Amplification RETEST (Asymptomatic and No Specific COVID-19 Suspicion); No; No; No; No Procedure Planned; Unknown Bilateral Anterior Nares +/- OP Swab  Covid-19 Rna (GYF74) Not detected   Comments (BSW96) See Comment     12/10/2021 12:04 AM Urine Culture Urine, Midstream  Bacterial Culture, Urine W/o Gram Stain >100,000 Candida albicans     12/10/2021  2:09 AM Peripheral Blood Culture Peripheral Blood  Peripheral Blood Culture No growth 5 days.     12/10/2021  2:11 AM Peripheral Blood Culture Peripheral Blood  Peripheral Blood Culture No growth 5 days.     12/11/2021  4:18 PM Hepatitis A virus Antibody IgG Serum  Hepatitis A Virus Antibody Igg POS     12/12/2021  3:31 AM Varicella zoster virus DNA Blood, EDTA Tube  Varicella Zoster Dna Not detected   Varicella Zoster Dna Reference range: Not detected   Varicella Zoster Dna This test was developed and its performance characteristics determined by the [facility].  It has not been cleared or approved by the U[place]. Food and Drug Administration.     12/11/2021  4:18 PM Hepatitis B Surface Antigen Serum  Hepatitis B Surface Antigen NEG     12/11/2021  4:18 PM Hepatitis B Surface Antibody, Quantitative Serum  Hepatitis B Surface Antibody, Quantitative 12     12/11/2021  4:18 PM Hepatitis B Core Antibody, Total Serum  Hepatitis B Core Antibody, Total NEG     12/12/2021  3:31 AM Rapid HSV DNA, Skin Lesion / Blood Blood, EDTA Tube  Hsv1 Dna Not detected   Hsv2 Dna Not detected   Comments   Reference value for all analytes: Not Detected.  This test is a modification of the FDA authorized assay. Performance characteristics have been determined by the [facility].       12/12/2021  9:06 PM COVID-19 RNA, RT-PCR/Nucleic Acid Amplification Screening (Asymptomatic and No Specific COVID-19 Suspicion); No; No; No; No Procedure Planned; Unknown Bilateral Anterior Nares +/- OP Swab   Covid-19 Rna (PRF16) Not detected   Comments (BWG66) See Comment     12/13/2021  3:02 AM Cytomegalovirus DNA, Quantitative PCR, Plasma Serum  Cmv Dna, Quant. Pcr [id number]   Cmv Dna, Quant. Pcr(log Iu/ml) 5.65     12/13/2021  3:50 AM Central Blood Culture Central Blood  Central Blood Culture No growth 2 days.     12/13/2021  3:51 AM Central Blood Culture Central Blood  Central Blood Culture No growth 2 days.     12/13/2021  1:29 PM Urine Culture Urine, Indwelling Catheter Not in Vacutainer  Bacterial Culture, Urine W/o Gram Stain >100,000 Presumptive Candida albicans (Definitive identification performed on previous culture from  12/10/21.)     12/14/2021 11:45 AM Urine Culture Urine, Midstream  Bacterial Culture, Urine W/o Gram Stain >100,000 Candida albicans       OSH  1/18 GI PCR: +norovirus  1/18: flu, RSV, covid neg  1/22: CMV PCR positive  1/22: EBV PCR neg  1/24: blood cx x2: neg  1/25: VZV PCR: neg  Radiology Results   CT Abdomen /Pelvis with Contrast    Result Date: 12/15/2021  1.  Findings compatible with enterocolitis of infectious or inflammatory etiology. 2.  Marked hepatic steatosis. 3.  Increased sclerosis of bone lesions throughout the thoracolumbar spine, bony pelvis, and proximal left femur. Appearance is atypical for multiple myeloma but may represent a combination of treated, partially treated, and active disease. No acute pathologic fracture.      XR Chest 1 View (AP Portable)    Result Date: 12/13/2021  FINDINGS/IMPRESSION: Interval removal of right internal jugular venous catheter and placement of a left PICC with tip the cavoatrial junction. Increased hazy perihilar and basilar interstitial and airspace opacities likely reflecting atelectasis given very low lung volumes versus mild pulmonary edema. Small bilateral layering pleural effusions. No pneumothorax. Unchanged cardiac and mediastinal contours. Report dictated by: Vena Austria, MD, signed by: Vena Austria, MD Department of Radiology and  Biomedical Imaging     CT Brain without Contrast    Result Date: 12/15/2021  No acute intracranial hemorrhage, herniation, or hydrocephalus.      CT Chest with Contrast    Result Date: 12/15/2021  1.  Medium-sized bilateral pleural effusions with bilateral peribronchovascular groundglass opacities, suggestive of pulmonary edema. 2.  Clustered nodules in right middle lobe along the right minor fissure, which could represent mild aspiration or infection. 3.  Numerous sclerotic and mixed lytic/sclerotic lesions throughout the thoracic spine and bilateral ribs, increased in size and number compared to prior. Appearance is atypical for multiple myeloma but may represent a combination of treated, partially treated, and active disease. No acute pathologic fracture.    US Abdomen RUQ  Result Date: 12/03/2021  1. Liver is enlarged with diffuse increased echogenicity. Finding is nonspecific but consistent with fatty infiltration. 2. Large 2 cm stone in the gallbladder, no sonographic evidence of acute cholecystitis. 3. Two gallbladder polyps measuring 6 mm and 3 mm.    MRI liver with and without contrast  Result Date: 12/08/2021  1. Cholelithiasis with mild gallbladder wall edema. Findings are disfavored to represent acute cholecystitis. Gallbladder wall thickening can be seen in the setting of underlying liver disease or cardiac disease. 2. No choledocholithiasis or biliary obstruction. 3. Marked hepatic steatosis. 4. Moderate bilateral pleural effusions and adjacent atelectasis.     CT abdomen pelvis without contrast  Result Date: 12/02/2021  1. There are scattered sclerotic lesions identified throughout the lumbar spine and pelvis which have increased in size and number since 03/10/2018 suspicious for osseous metastases which have increased in severity. No pathologic fracture could be identified. 2. No significant liver lesion, lymphadenopathy or mass could be identified throughout the intra-abdominal compartment. 3. Fatty  liver. 4. A small left pleural effusion is present which has increased in size. Mild left basilar atelectasis/infiltrates have increased. Mild right basilar atelectasis/infiltrate is present. 5. No acute intra-abdominal inflammatory process, free fluid, free air, suspicious mass or abscess.     CT head without contrast  Result Date: 12/02/2021  1. No acute intracranial findings. No evidence of acute infarct or intracranial hemorrhage..     Outside records reviewed from [facility]. Summary of outside findings: per HPI.     I spoke with Dr. Ranae Pila from [facility] regarding the A/P.    Assessment and Recommendations  Ariel Braun is a 61yo woman with MM s/p mel-auto in 2012, most recently on dara+pom+dex (last 10/2021), PE previously on Eliquis, admitted [facility] on 1/18 with weakness and diarrhea and found to have norovirus,  course c/b hypovolemic shock requiring ICU admission, transferred to [facility] for further management and found to have plasma CMV 444,000 with LFT abnormalities, ongoing diarrhea and colitis, pancytopenia, and encephalopathy. CMV with colitis, hepatitis, and possibly encephalitis would be a potential unifying diagnosis for all of these abnormalities (acknowledging that CMV encephalitis is rare and her mental status may be due to recent critical illness and liver/metabolic abnormalities).     Recommendations  - GI consult for colonoscopy to evaluate for CMV colitis (particularly in s/o repeated norovirus positive, will be helpful to confirm that CMV is causative)  - MRI brain w/wo contrast  - LP to be sent for cell count, protein, glucose, bacterial culture, HSV PCR, CMV PCR & hold sample for additional studies  - Follow up pending liver biopsy results  - Start IV ganciclovir 5 mg/kg q12  - Stop acyclovir  - Would stop pip-tazo since cultures have been negative, cross-sectional imaging shows no evidence of bacterial infection, and CMV is an explanation for fevers  - Would not treat Candida albicans in urine,  as Candida in urine is almost always a colonizer and she does not have symptoms; note micafungin does not penetrate into urine (fine to continue it if it's for prophylaxis)     Total time spent in encounter: 75 minutes    We will continue to follow with you.  Please contact Consult ID Transplant [facility] 1st Call via Voalte with questions.    Tonette Bihari, MD  12/15/2021

## 2021-12-15 NOTE — Progress Notes (Signed)
[facility] PROGRESS NOTE  ATTENDING ONLY     My date of service is 12/15/21.    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   - ID consulted for therapy and if CNS diagnostic work up needed   - [place] WNL despite some sinus disease, CT chest showing b/l pleural effusions and atelectasis. Cannot follow commands for ICS right now, will hold off.   - Path pending on liver biopsy    Subjective  Patient is not following commands but able to localize to voice and state, 'please don't leave me'. She was also able to ask 'what medications are you going to give me?'.    Vitals  Temp:  [36 C (96.8 F)-37.8 C (100 F)] 36.7 C (98.1 F)  Heart Rate:  [63-87] 79  *Resp:  [16-23] 20  BP: (94-104)/(62-76) 95/68  SpO2:  [95 %-98 %] 98 %    MostRecent Weight: 90.2 kg (198 lb 13.7 oz)  Admission Weight: 79.6 kg (175 lb 7.8 oz)      Intake/Output Summary (Last 24 hours) at 12/15/2021 1352  Last data filed at 12/15/2021 1200  Gross per 24 hour   Intake 1864 ml   Output 2200 ml   Net -336 ml       Pain Score: 0    Physical Exam  Appearance: She is ill-appearing.      Comments: Alert and oriented X 1-2 slow to respond. Confused.    HENT:      Head: Normocephalic and atraumatic.      Nose: Nose normal.   Neck:  R IJ CVC with dressing in place, c/d/i  Eyes:      General: Scleral icterus present.      Extraocular Movements: Extraocular movements intact.      Conjunctiva/sclera: Conjunctivae normal.      Pupils: Pupils are equal, round, and reactive to light.   Cardiovascular:      Rate and Rhythm: Normal rate and regular rhythm.      Pulses: Normal pulses.      Heart sounds: Normal heart sounds. No murmur heard.    No friction rub. No gallop.   Pulmonary:      Effort: Pulmonary effort is normal. No respiratory distress.      Breath sounds: Normal breath sounds. No stridor. No wheezing, rhonchi or rales.    Abdominal:      General: Abdomen is flat. Bowel sounds are normal. There is no distension.      Palpations: Abdomen is soft. There is no mass.      Tenderness: There is no abdominal tenderness. There is no guarding or rebound.      Hernia: No hernia is present.      Comments: Foley catheter in place, urine appears dark  No suprapubic tenderness.   Musculoskeletal:         General: Normal range of motion.      Cervical back: Normal range of motion and neck supple.      Right lower leg: Edema present.      Left lower leg: Edema present.      Comments: Strength 1/5 in the bilateral lower extremities, unable to raise them to gravity.   Pitting edema 3-4+ in the bilateral lower extremities.   Anasarca in the abdomen and thighs bilaterally.  Skin:     General: Skin is warm.      Capillary Refill: Capillary refill takes less than 2 seconds.      Coloration: Skin is jaundiced.      Findings: Bruising present.      Comments: ecchymoses on the sternum   Neurological:      Mental Status: She is alert. She is disoriented.      Motor: Weakness present.       Scheduled Meds:   0.9% sodium chloride flush  10-20 mL Intravenous Q12H [place]    0.9% sodium chloride flush  3 mL Intravenous Q12H [place]    dry mouth oral rinse  5 mL Mucous Membrane 4x Daily [place]    ganciclovir (CYTOVENE) IVPB  5 mg/kg (Dosing Weight) Intravenous Q12H [place]    piperacillin-tazobactam  4.5 g Intravenous Q8H     Continuous Infusions:   dextrose 5 % and 0.9 % sodium chloride 75 mL/hr (12/15/21 0052)     PRN Meds:   0.9% sodium chloride flush  10-20 mL Intravenous PRN    0.9% sodium chloride flush  3 mL Intravenous PRN    dextrose 50%  12.5 g Intravenous Q15 Min PRN    Or    glucose  20 g Oral Q15 Min PRN    Or    juice (for hypoglycemia)  16 g Oral Q15 Min PRN    HYDROmorphone  0.2 mg Intravenous Q4H PRN    magnesium sulfate in dextrose 5 %  2-4 g Intravenous Daily PRN    Or    magnesium sulfate in water  2-4 g Intravenous Daily PRN     metoclopramide  5-10 mg Intravenous Q8H PRN    ondansetron  8 mg Intravenous Q8H PRN    polyethylene glycol  17 g Oral Daily PRN    potassium chloride in sterile water  20-80 mEq Intravenous Daily PRN    Or    potassium chloride in sterile water  20-80 mEq Intravenous Daily PRN    Or    potassium chloride  20-80 mEq Oral Daily PRN       Data    CBC        12/15/21  0248 12/14/21  0305   WBC 1.0* 1.3*   HGB 8.7* 9.2*   HCT 26.3* 28.1*   PLT 62* 69*     Coags        12/15/21  0248 12/14/21  0305   INR 1.5* 1.4*     Chem7        12/15/21  0248 12/14/21  0305   NA 143 144   K 3.1* 3.5   CL 116* 117*   CO2 22 21*   BUN 8 8   CREAT 0.43* 0.43*   GLU 84 96     Electrolytes        12/15/21  0248 12/14/21  0305   CA 7.6* 7.7*     Liver Panel        12/15/21  0248 12/14/21  0305   AST 77* 78*   ALT 83* 89*   ALKP 475* 504*   TBILI 12.5* 12.8*   TP 2.9* 3.0*   ALB 1.5* 1.6*     Lactate  No results found in last 36 hours    Microbiology Results (last 24 hours)     Procedure Component Value Units Date/Time    Urine Culture [[id number]]  (Abnormal) Collected: 12/14/21 1145    Order Status: Completed Specimen: Not  Applicable from Urine, Midstream Updated: 12/15/21 1224     Bacterial Culture, Urine w/o gram stain >100,000 Yeast isolated (further ID to follow)    Central Blood Culture [[id number]] Collected: 12/13/21 0350    Order Status: Completed Specimen: Not Applicable from Central Blood Updated: 12/15/21 0609     Central Blood Culture No growth 2 days.    Central Blood Culture [[id number]] Collected: 12/13/21 0351    Order Status: Completed Specimen: Not Applicable from Central Blood Updated: 12/15/21 0609     Central Blood Culture No growth 2 days.    Peripheral Blood Culture [[id number]] Collected: 12/10/21 0209    Order Status: Completed Specimen: Not Applicable from Peripheral Blood Updated: 12/15/21 2353     Peripheral Blood Culture No growth 5 days.    Peripheral Blood Culture [[id number]] Collected: 12/10/21 0211     Order Status: Completed Specimen: Not Applicable from Peripheral Blood Updated: 12/15/21 0867     Peripheral Blood Culture No growth 5 days.    Cytomegalovirus DNA, Quantitative PCR, Plasma [[id number]]  (Abnormal) Collected: 12/13/21 0302    Order Status: Completed Specimen: Not Applicable from Serum Updated: 12/14/21 1746     CMV DNA (IU/mL) 443,961 IU/mL      Comment: *SUBCRITICAL*  Performed by Real-Time PCR  Note new reference range.          CMV DNA (Log IU/mL) 5.65     Comment: Log10 IU/mL  Note new reference range.         Clostridium Difficile [[id number]]     Order Status: Sent Specimen: Not Applicable from Stool     Urine Culture [[id number]]  (Abnormal) Collected: 12/13/21 1329    Order Status: Completed Specimen: Not Applicable from Urine, Indwelling Catheter Not in Vacutainer Updated: 12/14/21 1519     Bacterial Culture, Urine w/o gram stain >100,000 Presumptive Candida albicans (Definitive identification performed on previous culture from  12/10/21.)          Radiology Results   CT Abdomen /Pelvis with Contrast    Result Date: 12/15/2021  1.  Findings compatible with enterocolitis of infectious or inflammatory etiology. Involvement of the colon is greater than of the small bowel. 2.  Marked hepatic steatosis. 3.  Increased sclerosis of bone lesions throughout the thoracolumbar spine, bony pelvis, and proximal left femur. Appearance is atypical for multiple myeloma but may represent a combination of treated, partially treated, and active disease. No acute pathologic fracture. Report dictated by: Cathie Olden Leonidas Romberg, MD, signed by: Lisbeth Renshaw, MD Department of Radiology and Biomedical Imaging     CT Brain without Contrast    Result Date: 12/15/2021  No acute intracranial hemorrhage, herniation, or hydrocephalus. Report dictated by: Jason Coop, MD, signed by: Danella Deis Eulas Post, MD Department of Radiology and Biomedical Imaging     CT Chest with Contrast    Result Date: 12/15/2021  1.   Medium-sized bilateral pleural effusions with bilateral peribronchovascular groundglass opacities, suggestive of pulmonary edema. 2.  Clustered nodules in right middle lobe along the right minor fissure, which could represent mild aspiration or infection. 3.  Numerous sclerotic and mixed lytic/sclerotic lesions throughout the thoracic spine and bilateral ribs, increased in size and number compared to prior. Appearance is atypical for multiple myeloma but may represent a combination of treated, partially treated, and active disease. No acute pathologic fracture.      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 The patient  has received VRd X 4 (achieving VGPR) then Mel 200 ASCT 05/12/11. 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 She met the criteria for PD on 02/19/2020 and was started on third line DARA PD in June 2021, which was recently held on 10/20/21 due to chronic diarrhea. Patient was transferred OSH after recent hypovolemic/distributive shock. Here for acute encephalopathy workup and acute liver failure workup.      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   June 2020 due to progression of M spike Revlimid was increased to 15 mg PO D1-21 every 28 days, plus dexamethasone  July 2021: started daratumumab, Pomylast 2 mg D 1-21, Q28 days, dexamethasone 12 mg PO Q weekly.   Primary oncologist: Dr. Iona Coach,  Dr. Kennon Rounds [facility]  Receiving dexamethasone 12 mg weekly for diarrhea  Receiving MT with daratumumab with pomalyst and dex, most recent dose 10/16/21, has since been on hold due to worsening diarrhea.     DATA:   09/07/2010: IgG Kappa MM  12/02/2021: Ig M <5, IgG 447, IgA 20, Ig E <2, SFLC Kappa 16.6,  Lambda 1.6, Kappa/Lamda 10.38  12/14/21: IgG K on IFE, IgG 430, KLC 16.6, LLC 2.7, KLR 6.15    Chemo:   Holding Daratumumab and Pomalyst, last dose 10/20/21, given recent infection and acute liver failure.    Supportive Care:   Transfusion: Keep Hg >7, and Platelets >10.     Per daughter, has autoantibodies in blood was a difficult match. Would like to be contacted prior to transfusion.     Line: PICC placed 1/26    Outpatient Oncologist: Dr. Kennon Rounds    Dispo: TBD, after resolution of sepsis and liver failure    # Immunocompromised  #CMV colitis  # At risk for infections due to malignancy  # Shock, likely hypovolemic vs distributive in the setting of chronic diarrhea, + norovirus infection   # Acute on chronic diarrhea, norovirus positive., diff and OP negative.   -No prior evidence of adrenal insufficiency, AM cortisol levels normal, previously on dexamethasone 8 mg PO Qweekly, but has been held since early December. CMV PCR on 1/    Data:  - Ucx: pending  - Bcx: pending  - CT brain: No acute intracranial hemorrhage, herniation, or hydrocephalus.  - CT chest/A/P pending: Medium-sized bilateral pleural effusions with bilateral peribronchovascular groundglass opacities, suggestive of pulmonary edema. Clustered nodules in right middle lobe along the right minor fissure, which could represent mild aspiration or infection. Numerous sclerotic and mixed lytic/sclerotic lesions throughout the thoracic spine and bilateral ribs, increased in size and number compared to prior. Appearance is atypical for multiple myeloma but may represent a combination of treated, partially treated, and active disease. No acute pathologic fracture. Findings compatible with enterocolitis of infectious or inflammatory etiology. Involvement of the colon is greater than of the small bowel. Marked hepatic steatosis. Increased sclerosis of bone lesions throughout the thoracolumbar spine, bony pelvis, and proximal left femur. Appearance is  atypical for multiple myeloma but may represent a combination of treated, partially treated, and active disease. No acute pathologic fracture.  - 1/29: [id number]    Plan:  - IV Zosyn 4.5 g Q8hrs (1/26-1/28, restarted 1/29 given recurrent fever)  - Ganciclovir 5 mg/kg (  1/31-)   - appreciate ID reccs  -Monitor I/O's  - OI ppx: Acyclovir IV    # Grade III neutropenia, at baseline  # Leukopenia  #Thrombocytopenia (possibly 2/2 liver disease)  Data: WBC of 2.2 on 12/05/21, on Zarxio at home  Plan: Continue to monitor    # Acute liver failure of unclear etiology  # Acute on chronic encephalopathy likely 2/2 infectious vs hepatic vs metabolic  # Hyperbilirubinemia  # Anasarca  # Hypoalbuminemia    Data:   -Steady rise in LFTs in 05/2021,  -12/02/21: AST 152--> 121, ALT 130--> 110, TB 5.4, Albumin 1.1  -Hepatitis panel negative.   -1/26:  Tbili 14.6, GGT 1160, ammonia 25  -CTAP 12/02/21: Scattered sclerotic lesions in the lumbar spine, hepatic steatosis.   -RUQ ultrasound: liver enlarged with diffuse increased echogenicity, consistent with fatty infiltration. 2cm stone in the gallbladder.   -HIDA scan: Normal radiotracer filling of the gallbladder is identified, therefore the exam is not consistent with cystic duct obstruction or acute cholecystitis.    [place] abd 1/27:   Hepatomegaly with diffusely increased echogenicity which can be seen with hepatic amyloidosis or steatosis. Please refer to the recently performed biopsy for further details.     2.  Mild perihepatic ascites.    3.  Patent hepatic vasculature with normal waveforms.    4.  Cholelithiasis without acute cholecystitis.    5.  Right pleural effusion.    Portal pressures:  Mean right atrial pressure (mmHg): 4  Mean inferior vena cava pressure (mmHg): 5  Mean free hepatic vein pressure (mmHg): 8   Mean wedged hepatic vein pressure (mmHg): 16     - Follow-up with the pending studies:  Ferritin and iron studies  HEV  IgG/IgM  PCR forVZV, HSV,CMV,EBV  Please send AMA,IgG4  Hepatitis A IgG  Hepatitis B core Ab, surface Ab     Plan:   [ ]  Hepatology consult, follow up reccs.   [ ]  Liver bx 1/27, follow up patholoy  - daily LFTS/INR    #AMS  In the setting of liver failure, ammonia only 25.     Data:   1/31: HIV, TSH, Folate, B12 pending  1/30: CT head WNL other than sinus disease    Plan:  - given CMV elevation, c/f CMV encephalitis, ID c/s to discuss need for further work up/changes to therapy [ ]    - sending reversible causes of dementia w/up  - consider neurology c/s    # History of PE, diagnosed in April 2019,   - hold home [place] for now given recent anemia and concerns for bleeding.    -monitor for signs and symptoms of worsening bleeding.     # Elevated TSH at OSH, likely 2/2 subclinical hypothyroidism  -repeat TSH when acute sickness resolved.     # Anxiety, emotional distress:   -Patient has stressful social situation, intellectually disabled son, and regarding underlying disease process.     #Chronic buttocks pressure wound  -Wound care consult, appreciate recs  -Position changes as tolerated, waffle cushion, etc    #VTE PPx:  Contraindicated: Anticipated procedure    Severity of Illness     During this hospitalization the patient is also being treated for:  - Sepsis (present on admission)    - Malignancy associated fatigue on admission  - Neoplasm/malignancy associated pain  - Hypoalbuminemia (present on admission)  - Fluid status: Hypovolemia on admission     - Cachexia  - Encephalopathy: Metabolic encephalopathy  Code Status: FULL    Farrel Conners, MD  12/15/21

## 2021-12-15 NOTE — Consults (Signed)
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 The patient has received VRd X 4 (achieving VGPR) then Mel 200 ASCT 05/12/11. 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 She met the criteria for PD on 02/19/2020 and was started on third line DARA PD in June 2021, which was recently held on 10/20/21 due to chronic diarrhea. Patient was transferred OSH after recent hypovolemic/distributive shock. Here for acute encephalopathy workup and acute liver failure workup.    Assessment and Treatment Plan    OT Progress summary: Pt continues to be oriented to self but delayed processing, and very impaired cognitively. Pt limited by encephalopathy impacting pt ability to follow commands, engage in ADLs EOB and tolerate sitting. Pt restless RASS -1 to +1 EOB with CG for sitting balance. Pt tolerated 15 min however requires max A for ADLs. Pt unable to engage in active transfers at this time due to impaired cognition, motor planning and weakness. Plan to trial ceiling lift to chair and safety in chair. Recommend placement.  Progress with OT: Good progress  Current maximal level of assist: Dependent assist  Next session focus:      Recommendations    Recommended Discharge Disposition Placement for continued therapy   Discharge DME Recommendations Defer to next level of care;To be determined   Equipment Recommendations     DME Comment     Discharge Recommendations Comment     Rehab Potential Patient participates well in therapy and is progressing towards goal   Anticipated Assistance Available at Discharge Family;Spouse/Significant other   Anticipated Type of Assistance Available at Discharge Assist as needed   Anticipated Time of Assistance Available at Discharge     Barriers to Discharge Medical issues;Impaired Cognition;Insufficient activity tolerance   Patient's current functional ability  appropriate for D/C recommendations No     Inpatient Recommendations  OT Inpatient Recommendations Bed in chair position;Encourage participation in ADLs;Delirium precautions   Recommendation Comments     Current Maximal Level of Assist Needed Dependent assist     Brace/Precautions   Brace/Orthotic/Prosthetic:   Precautions:Has weight bearing limitation or precaution: Yes  Precautions and weight bearing status comments: delrium, falls     Subjective Report:"Please let me go"    Patient/Family Goal:     Objective Findings and Interventions    Areas of Occupation    Grooming and Light Hygiene Maximal assistance   Cueing Required     Comment Wash face, brush teeth   Self Feeding Moderate assistance   Cueing Required     Comment Spoonful of shake, pt tolerated 6x with increased time sitting EOB   Toileting Maximal assistance   Cueing Required     Toileting Clothing Management Maximal assistance   Cueing Required     Comment max A rolling, max A pericare   Upper Body Dressing     Cueing Required     Comment     Lower Body Dressing     Cueing Required     Comment     Upper Body Bathing     Cueing Required     Comment     Lower Body Bathing     Cueing Required     Comment     ADL/IADL Comment         Functional Transfers    Bed Mobility From Supine   Bed Mobility To  Sitting EOB   Level of Assist Maximal assistance   Cueing Required     Transfer From Sit   Transfer To     Level of Assist Contact guard;Minimal assistance   Cueing Required     Technique     Functional Transfer Comment Pt tolerated sitting EOB for 15 min w/ min A to CG. Pt restless RASS -1 to +1. Pt very delayed in communication, reported dicomfort in back and fatigue. Encephlopathic unable to utilize posterior rest break and support. Max A grooming and mod A feeding EOB. Max A back to bed and toileting. Pt oriented to self.     Functional Balance for ADLs  Position Static Dynamic   Sit Static sitting level of assist: Contact guard assist-     Dynamic sitting  level of assist: Minimal assist-  Dynamic sitting comment: leaning forward 2/2 discomfort and confusion     Stand  -      -            [employer] [facility]	[place] AM-PAC for ADLs  6 Click Score: 11    Client Factors  Communication comment:      Hearing comment:      Cognitive deficits noted in the following area(s):: Orientation, Attention, Memory, Judgement/Safety  Cognition comment:RASS -1 to +1    Initiation;Organization;Sequencing;Problem ID;Problem solving;Information processing;Safety awareness  Executive functions comment:                                                     OT Education       OT Plan of [place], OT  12/15/2021 12:14 PM

## 2021-12-15 NOTE — Plan of Care (Signed)
Pt AxOx1 (oriented to self). Pt experiencing word finding difficulty @ start of shift and unable to finish sentences. Per pt checklist of nonverbal pain indicators, pt appeared very uncomfortable. MD paged. IV Dilaudid given x1. Upon reassessment, pt appeared more comfortable w/normal respiration. Turning pt q2 hrs. At 0300 V/S, pt able to answer questions in full sentences (I.e. "Yes, I was able to sleep"). Will CTM.    Problem: Discharge Planning - Adult  Goal: Knowledge of and participation in plan of care  Outcome: Progress within 12 hours     Problem: Fall, at Risk or Actual - Adult  Goal: Absence of falls and fall related injury  Outcome: Progress within 12 hours  Goal: Knowledge of fall prevention  Outcome: Progress within 12 hours     Problem: Delirium - Adult / Pediatric  Goal: Absence or resolution of delirium  Outcome: Progress within 12 hours

## 2021-12-15 NOTE — Progress Notes (Signed)
Hepatology/Liver Transplant Service Consultation Note     24 Hour Course  S/p liver biopsy with hepatic venous pressure gradient (HVPG) of 8.   Prelim liver biopsy review showed extensive hepatic steatosis  Abd [place] - hepatomegaly with diffusely increased echogenicity which can be seen with hepatic amyloidosis or steatosis.  LFT trend show ALT 83, AST 77, ALP 475, and total bilirubin 12.5    Subjective  AO 1-2 this morning, denies pain. She is emotional.     Objective  Current Facility-Administered Medications   Medication Dose Route Frequency Provider Last Rate Last Admin    0.9% sodium chloride flush injection syringe 10-20 mL  10-20 mL Intravenous Q12H [place], DO        0.9% sodium chloride flush injection syringe 10-20 mL  10-20 mL Intravenous PRN Zena Amos, DO        0.9% sodium chloride flush injection syringe 3 mL  3 mL Intravenous Q12H [place] Anousheh Afjei, MD        0.9% sodium chloride flush injection syringe 3 mL  3 mL Intravenous PRN Carmine Savoy, MD        dextrose 5 % and 0.9 % sodium chloride infusion  75 mL/hr Intravenous Continuous Zena Amos, DO 75 mL/hr at 12/15/21 0052 75 mL/hr at 12/15/21 0052    dextrose 50% injection syringe 12.5 g  12.5 g Intravenous Q15 Min PRN Zena Amos, DO   12.5 g at 12/14/21 1133    Or    glucose chewable tablet 20 g  20 g Oral Q15 Min PRN Zena Amos, DO        Or    juice (for hypoglycemia) 16 g  16 g Oral Q15 Min PRN [place] Sandy Karr, DO        dry mouth oral rinse (BIOTENE DRY MOUTH) solution 5 mL  5 mL Mucous Membrane 4x Daily [facility] Farrel Conners, MD   5 mL at 12/15/21 1610    ganciclovir (CYTOVENE) 400 mg in dextrose 5% 100 mL IVPB  5 mg/kg (Dosing Weight) Intravenous Q12H [facility] Farrel Conners, MD 100 mL/hr at 12/15/21 2007 400 mg at 12/15/21 2007    HYDROmorphone (DILAUDID) injection syringe 0.3-0.8 mg  0.3-0.8 mg Intravenous Q4H PRN Deboraha Sprang, MD   0.3 mg at 12/15/21 1837     magnesium sulfate in dextrose 5 % 1 g/100 mL IVPB 2-4 g  2-4 g Intravenous Daily PRN Bernestine Amass, MD        Or    magnesium sulfate in water 2 gram/50 mL (4 %) IVPB 2-4 g  2-4 g Intravenous Daily PRN Bernestine Amass, MD        metoclopramide (REGLAN) injection 5-10 mg  5-10 mg Intravenous Q8H PRN Bernestine Amass, MD   10 mg at 12/12/21 2102    micafungin (MYCAMINE) 100 mg in sodium chloride 0.9 % 100 mL IVPB (Minibag Plus)  100 mg Intravenous Q24H Deboraha Sprang, MD   Ended at 12/15/21 1847    ondansetron (ZOFRAN) injection 8 mg  8 mg Intravenous Q8H PRN Zena Amos, DO   8 mg at 12/14/21 0822    piperacillin-tazobactam (ZOSYN) 4.5 g in sodium chloride 0.9 % 100 mL IVPB (Minibag Plus) EXTENDED infusion  4.5 g Intravenous Q8H Darryl Lent, MD 25 mL/hr at 12/15/21 1825 4.5 g at 12/15/21 1825    polyethylene glycol (MIRALAX) packet 17 g  17 g Oral Daily PRN Carmine Savoy, MD  potassium chloride in sterile water 20 mEq/50 mL IVPB 20-80 mEq  20-80 mEq Intravenous Daily PRN Bernestine Amass, MD 50 mL/hr at 12/13/21 0521 40 mEq at 12/13/21 1898    Or    potassium chloride in sterile water 40 mEq/100 mL IVPB 20-80 mEq  20-80 mEq Intravenous Daily PRN Bernestine Amass, MD 50 mL/hr at 12/15/21 0815 40 mEq at 12/15/21 0815    Or    potassium chloride (KLOR-CON) ER tablet 20-80 mEq  20-80 mEq Oral Daily PRN Bernestine Amass, MD           Vitals  Temp:  [36 C (96.8 F)-37.8 C (100 F)] 37.1 C (98.8 F)  Heart Rate:  [63-79] 75  *Resp:  [16-23] 20  BP: (94-102)/(62-71) 101/68  General: well appearing, pleasant, NAD  HEENT: NC/AT, PERRL, MMM  Respiratory: breathing comfortably, symmetric chest expansion, CTAB  Cardiovascular: RRR, normal S1, S2, no M/R/G, no edema  GI: obese, soft, normoactive bowel sounds, non tender, non distended, no HSM  Extremities: no edema  Neuro: A&O x 1-2, no asterixis  Psych: normal affect    Data  CBC        12/15/21  0248   WBC 1.0*   HGB 8.7*   HCT 26.3*   PLT 62*     Coags        12/15/21  0248   INR  1.5*     Chem7        12/15/21  0248   NA 143   K 3.1*   CL 116*   CO2 22   BUN 8   CREAT 0.43*   GLU 84     Electrolytes        12/15/21  0248   CA 7.6*     Liver Panel        12/15/21  0248   AST 77*   ALT 83*   ALKP 475*   TBILI 12.5*   TP 2.9*   ALB 1.5*     TSH        12/15/21  0248 12/10/21  0208   TSH 3.98 2.65     HIV        12/15/21  0400   HIVAA1 NEG          CRP        12/13/21  0302   CRP 11.3*     UA        12/14/21  1145   GUA 50*   BIUA Moderate*   KEUA NEG   SGUA 1.034*   HBUA Moderate*   PHUA 6.0   PRUA 30*   NIUA NEG   WEUA POS*       Microscopy:        12/14/21  1145   WCU >50*   RCU >50*   SQEU >10     @FEVSF [phone]:5:0:0:-1)@  Microbiology results - 7 Days   Microbiology Results   Date Collected Specimen Source Result   12/10/2021 12:04 AM MRSA Culture Anterior Nares Swab  Methicillin Resistant Staph Aureus Screen No Methicillin resistant Staphylococcus aureus (MRSA)isolated.     12/10/2021 12:04 AM COVID-19 RNA, RT-PCR/Nucleic Acid Amplification RETEST (Asymptomatic and No Specific COVID-19 Suspicion); No; No; No; No Procedure Planned; Unknown Bilateral Anterior Nares +/- OP Swab  Covid-19 Rna (MKJ03) Not detected   Comments (XYO11) See Comment     12/10/2021 12:04 AM Urine Culture Urine, Midstream  Bacterial Culture, Urine W/o Gram Stain >100,000 Candida albicans  12/10/2021  2:09 AM Peripheral Blood Culture Peripheral Blood  Peripheral Blood Culture No growth 5 days.     12/10/2021  2:11 AM Peripheral Blood Culture Peripheral Blood  Peripheral Blood Culture No growth 5 days.     12/11/2021  4:18 PM Hepatitis A virus Antibody IgG Serum  Hepatitis A Virus Antibody Igg POS     12/12/2021  3:31 AM Varicella zoster virus DNA Blood, EDTA Tube  Varicella Zoster Dna Not detected   Varicella Zoster Dna Reference range: Not detected   Varicella Zoster Dna This test was developed and its performance characteristics determined by the [facility].  It has not been cleared or approved  by the U. S. Food and Drug Administration.     12/11/2021  4:18 PM Hepatitis B Surface Antigen Serum  Hepatitis B Surface Antigen NEG     12/11/2021  4:18 PM Hepatitis B Surface Antibody, Quantitative Serum  Hepatitis B Surface Antibody, Quantitative 12     12/11/2021  4:18 PM Hepatitis B Core Antibody, Total Serum  Hepatitis B Core Antibody, Total NEG     12/12/2021  3:31 AM Rapid HSV DNA, Skin Lesion / Blood Blood, EDTA Tube  Hsv1 Dna Not detected   Hsv2 Dna Not detected   Comments   Reference value for all analytes: Not Detected.  This test is a modification of the FDA authorized assay. Performance characteristics have been determined by the [facility].       12/12/2021  9:06 PM COVID-19 RNA, RT-PCR/Nucleic Acid Amplification Screening (Asymptomatic and No Specific COVID-19 Suspicion); No; No; No; No Procedure Planned; Unknown Bilateral Anterior Nares +/- OP Swab  Covid-19 Rna (GIT19) Not detected   Comments (LVD47) See Comment     12/13/2021  3:02 AM Cytomegalovirus DNA, Quantitative PCR, Plasma Serum  Cmv Dna, Quant. Pcr [id number]   Cmv Dna, Quant. Pcr(log Iu/ml) 5.65     12/13/2021  3:50 AM Central Blood Culture Central Blood  Central Blood Culture No growth 2 days.     12/13/2021  3:51 AM Central Blood Culture Central Blood  Central Blood Culture No growth 2 days.     12/13/2021  1:29 PM Urine Culture Urine, Indwelling Catheter Not in Vacutainer  Bacterial Culture, Urine W/o Gram Stain >100,000 Presumptive Candida albicans (Definitive identification performed on previous culture from  12/10/21.)     12/14/2021 11:45 AM Urine Culture Urine, Midstream  Bacterial Culture, Urine W/o Gram Stain >100,000 Candida albicans       Radiology Results (last 24 hours)     Procedure Component Value Units Date/Time    CT Chest with Contrast [[id number]] Collected: 12/14/21 1806    Order Status: Completed Updated: 12/15/21 1639    Narrative:      CT CHEST WITH CONTRAST    12/14/2021 5:31 PM    CLINICAL HISTORY:   Fever of unknown origin.     ADDITIONAL HISTORY: History of multiple myeloma.    COMPARISON:   CT 01/01/2020.    TECHNIQUE: Serial 1.25 mm axial images through the chest were obtained after the administration of intravenous contrast.    Iohexol 350 - 120 mL - Intravenous    RADIATION DOSE INDICATORS:  Exposure Events: 3 , CTDIvol Min: 15.3 mGy, CTDIvol Max: 15.3 mGy, DLP: 1009.5 mGy.cm. The following accession numbers are related to this dose report [id number]    FINDINGS:    LUNGS:  Bilateral peribronchovascular groundglass opacities. Clustered nodules in right middle lobe along the right minor fissure.  PLEURA:  Medium-sized bilateral pleural effusions.    MEDIASTINUM:   No mediastinal lymphadenopathy by CT size criteria.    HEART/GREAT VESSELS:  Left upper extremity PICC with tip in the right atrium. Normal heart size. No pericardial effusion. Normal caliber thoracic aorta and main pulmonary artery.    BONES/SOFT TISSUES:  Numerous sclerotic and mixed lytic/sclerotic lesions throughout the thoracic spine and bilateral ribs, increased in size and number compared to prior. No acute pathologic fracture.    VISIBLE ABDOMEN:  Please see dedicated Abdomen and Pelvis CT report.      Impression:      1.  Medium-sized bilateral pleural effusions with bilateral peribronchovascular groundglass opacities, suggestive of pulmonary edema.    2.  Clustered nodules in right middle lobe along the right minor fissure, which could represent mild aspiration or infection.    3.  Numerous sclerotic and mixed lytic/sclerotic lesions throughout the thoracic spine and bilateral ribs, increased in size and number compared to prior. Appearance is atypical for multiple myeloma but may represent a combination of treated, partially treated, and active disease. No displaced pathologic fracture.    Report dictated by: Chilton Greathouse, MD, signed by: Jacki Cones  Department of Radiology and Biomedical Imaging    CT Brain without  Contrast [[id number]] Collected: 12/14/21 2310    Order Status: Completed Updated: 12/15/21 1226    Narrative:      CT BRAIN WITHOUT CONTRAST:  12/14/2021 10:31 PM    INDICATION (as provided by referring clinician): encephalopathy    ADDITIONAL HISTORY: 61 yo w/ hx of MM. IgG kappa (09/07/2010) transferred OSH after recent hypovolemic/distributive shock. Here for acute encephalopathy.    COMPARISON: PET/CT 01/01/2020     TECHNIQUE: Helical CT imaging of the brain without intravenous contrast. Coronal and sagittal reformatted images were obtained.    MEDICATIONS:  None    RADIATION DOSE INDICATORS:  Exposure Events: 2 , CTDIvol Min: 53.9 mGy, CTDIvol Max: 53.9 mGy, DLP: 1024.1 mGy.cm    FINDINGS:     No acute intracranial hemorrhage, large territory vascular infarct, or mass effect. Gray-white differentiation preserved throughout. No hypodensity.     Ventricles within normal limits of size for age.  No extra-axial collection.    No calvarial or skull base fracture. Bilateral maxillary sinus mucosal thickening including air-fluid levels in the right maxillary sinus, likely reflecting sinus disease. Soft tissue scalp nodules at the vertex and left parietal region, recommend correlation with direct visualization.      Impression:        No acute intracranial hemorrhage, herniation, or hydrocephalus.    Report dictated by: Jason Coop, MD, signed by: Danella Deis Eulas Post, MD  Department of Radiology and Biomedical Imaging    CT Abdomen Jerene Pitch with Contrast [[id number]] Collected: 12/14/21 1841    Order Status: Completed Updated: 12/15/21 1215    Narrative:      CT ABDOMEN/PELVIS WITH CONTRAST  12/14/2021 5:33 PM    CLINICAL HISTORY: FUO    COMPARISON:  Head CT 01/01/2020, ultrasound abdomen 12/11/2021, CT abdomen pelvis 03/10/2018    TECHNIQUE: CT of the abdomen and pelvis was performed.     MEDICATIONS:  Iohexol 350 - 120 mL - Intravenous    FINDINGS:      Visualized lung bases:  For chest findings, please see the  separately dictated report from the CT of the chest of the same date.    Liver:  Marked steatosis.    Gallbladder: Unremarkable    Spleen:  Unremarkable    Pancreas:  Unremarkable     Adrenal Glands:  Unremarkable    Kidneys:  Right lower pole parenchymal thinning with adjacent benign cyst.    GI Tract:  Diffuse bowel wall edema and surrounding stranding involving the colon and to a lesser extent the small bowel.     Vasculature:  Unremarkable    Lymphadenopathy: Absent    Peritoneum: Small volume of intraperitoneal free fluid.    Bladder: Foley catheter in a decompressed bladder.    Reproductive organs: Unremarkable    Bones:  Increased sclerosis of bone lesions throughout the thoracolumbar spine, bony pelvis, and proximal left femur. No acute pathologic fracture.    Extraperitoneal soft tissues: Anasarca.    Lines/drains/medical devices: As above      RADIATION DOSE INDICATORS:  Exposure Events: 3 , CTDIvol Min: 15.3 mGy, CTDIvol Max: 15.3 mGy, DLP: 1009.5 mGy.cm. The following accession numbers are related to this dose report [id number]      Impression:        1.  Findings compatible with enterocolitis of infectious or inflammatory etiology. Involvement of the colon is greater than of the small bowel.    2.  Marked hepatic steatosis.    3.  Increased sclerosis of bone lesions throughout the thoracolumbar spine, bony pelvis, and proximal left femur. Appearance is atypical for multiple myeloma but may represent a combination of treated, partially treated, and active disease. No acute pathologic fracture.          Report dictated by: Cathie Olden Leonidas Romberg, MD, signed by: Lisbeth Renshaw, MD  Department of Radiology and Biomedical Imaging          IMAGING:    MRCP 12/08/21:   1. Cholelithiasis with mild gallbladder wall edema. Findings are disfavored to represent acute cholecystitis. Gallbladder wall thickening can be seen in the setting of underlying liver disease or cardiac disease.   2. No  choledocholithiasis or biliaryobstruction.   3. Marked hepatic steatosis.   4. Moderate bilateral pleural effusions and adjacent atelectasis.    [place] 12/08/21:   Impressions:   1. No interval change without acute intracranial abnormalities.   2. Mild senescent changes are noted without obvious acute infarct. No hemorrhage, mass or midline shift. No findingsto suggest cerebral edema.    NM Hepatobiliary 12/09/21:   1. Normal radiotracer filling of the gallbladder is identified, therefore the exam is not consistent with cystic duct obstruction or acute cholecystitis.   2. Poor radiotracer uptake and diminished radiotracer excretion by the liver compatible with sequela of hepatocellular dysfunction.    RUQ U/S 12/03/21:   FINDINGS: Liver is mildly enlarged measuring 17.4 cm. There was diffuse increased echogenicity without focal lesions. Liver capsule was smooth. No dilated intra or extrahepatic ducts, common bile duct measured 4 mm. The gallbladder contained a large shadowing stone measuring 2 cm. There were 2 gallbladder polyps measuring 6 mm and 3 mm. No gallbladder wall thickening or tenderness. Right kidney measured 11.4 cm. Normal echogenicity and no hydronephrosis. Previously seen cyst on the lower pole the right kidney was not visualized.   IMPRESSION:   1. Liver is enlarged with diffuse increased echogenicity. Finding is nonspecific but consistent with fatty infiltration.   2. Large 2 cm stone in the gallbladder, no sonographic evidence of acutecholecystitis.   3. Two gallbladder polyps measuring 6 mm and 3 mm.    TTE 12/03/21:   Conclusions:   The left ventricular cavity size is normal.   Normal LV wall  thickness.   The left ventricular ejection fraction is in normal range at 68% by biplane MOD.   No LV regional wall motion abnormalities.   Normal diastolic function for age.       ENDOSCOPY:  No results found for this or any previous visit.  No results found for this or any previous  visit.    PATHOLOGY:   Liver biopsy 12/11/21 - extensive parenchymal steatosis occupying majority of hepatocyptes. No major inflammation, or infiltration or classic amyloidosis. Further staining is pending.    ASSESSMENT AND RECOMMENDATIONS  Ariel Braun is a 61 y.o. 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 Hepatology was consulted for work-up of elevated liver tests.      She started daratumumab, pomylast, and dexamethasone weekly in 2021. 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 Her ALKP and bilirubin rose to 278 and 1.5, respectively on 10/13/21. During her hospitalization at [facility] for hypovolemic shock in the setting of diarrhea from norovirus, her liver tests showed an elevated ALKP in the 1000s (GGT was in the 1000s at the time as well), AST in the 140s-160s, and ALT in the 120s-180s. Her bilirubin had also steadily rose to the 10s. Today, her ALT is 116, AST is 101, ALKP has improved to 745, and Tbili is now 14.6.  She has signs of hepatomegaly on RUQ [place] that was done at [facility], which also showed fatty liver.    Transjugular liver biopsy was performed. She is now s/p liver biopsy on 1/27 with elevated hepatic venous pressure gradient of 8. We reviewed the histology with pathology team. Suprisingly, there was extensive ballooning and parenchymal steatosis occupying majority of liver parenchyma. There was no evidence of significant inflammation, cholestasis or infiltration.     Based on history, there is no history of heavy  alcohol use. While she has some risk factors for NAFLD, the extent of hepatic steatosis is beyond natural history of NASH. At this juncture, the most likely DDx is secondary causes of rapidly progressive hepatic steatosis. Table below summaries the common causes of secondary NAFLD. Among them, history of steroid use is the most likely scenario in her case.           Recommendations:   - Consider avoiding steroid  - Optimize nutrition, consider nutrition consult  - Daily LFTs and INR     Thank you for involving [place] in [place] care, we will continue to follow with you.  Please don't hesitate to page [phone] with questions.  This patient was discussed with attending, Dr. March Rummage on 12/15/21.

## 2021-12-15 NOTE — Consults (Addendum)
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 The patient has received VRd X 4 (achieving VGPR) then Mel 200 ASCT 05/12/11. 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 She met the criteria for PD on 02/19/2020 and was started on third line DARA PD in June 2021, which was recently held on 10/20/21 due to chronic diarrhea. Patient was transferred OSH after recent hypovolemic/distributive shock. Here for acute encephalopathy workup and acute liver failure workup.    ASSESSMENT  Patient required Dep x 2 assist to perform supine<>sit and tolerated sitting EOB for ~ 15 minutes with CGA-Min A at trunk for balance and safety. Patient unable to verbalize why she did not want to remain sitting at EOB. Patient incontinent of BM while sitting, requiring Max A to roll L and R to perform peri-care. She will benefit from skilled PT and placement for continued therapy to decrease caregiver burden with functional mobility.    Focus next session: upright tolerance, therex    RECOMMENDATIONS  DISCHARGE RECOMMENDATION  Comments Placement with ongoing PT needs      Patient Current Functional Status Sufficient for PT Discharge Recommendation Yes   Discharge DME needs TBD pending patient progress.   Discharge transportation needs [place] Device Recommendation Vertical dependent lift;Ceiling lift   Activity Recommendation bed in chair position     SUBJECTIVE  Subjective report:  "[name]"  Notable observations:  Supine in bed with HOB elevated at beginning and end of session.    SYSTEMS REVIEW  Cognition/Communication  Delirium screen:  Yes Delirium Screen:   Details:  Encephalopathic    Communication  Cognition/communication impaired:  Yes  [place] Agitation Sedation Scale: (-2) Light sedation, Briefly (less than 10 second(s) awakens with eye contact to voice  Arousal comments:  (-2) while in bed, (+1) sitting EOB  Orientation: Disoriented to place, Disoriented to event, Disoriented to time  Communication: Difficulty making basic needs known, Difficulty making complex needs known  Cognition: Decreased attention, Delayed response, Impaired motor planning, Decreased safety awareness, Decreased insight to deficits, Decreased command following       Integumentary  Integumentary deficits:  YesIntegumentary deficit detail:  Jaundiced, bilateral lower extremity pitting edema, wounds at buttocks, foley    Cardiopulmonary  Cardiopulmonary deficits:  Yes  Detail:  Decreased activity tolerance    Musculoskeletal  Musculoskeletal deficits:  Yes  Abnormal strength findings: demonstrates grossly 2+/5 bilateral LE strength    Neuromuscular  Neuromuscular deficits:  Yes  Motor control or coordination: Impaired at bilateral upper and lower extremities        Pain     Currently in pain: Yes  Pain scale: Wong-Baker     Pain Level Upon Arrival: Hurts little more  Highest Pain Level During Therapy: Hurts whole lot  Pain Level End of Therapy: Hurts little more    COMPREHENSIVE MOVEMENT ANALYSIS/TREATMENT  Precautions/WB status: Yes  Precautions and weight bearing status comments: delrium, falls      Hemodynamic response:   Normal hemodynamic response: Yes  Response:     Comments: on room air      Functional Mobility  Requires second person/additional health care providers: Yes  Type additional health care provider: OT    Bed mobility Current Initial    Rolling  Level of assist  Maximal assist  Maximal assist (12/15/21 0932)  Device  Log roll  Log roll (12/15/21 0932)   Intervention     Rolling comments:  Rolling L and R to perform peri-care after BM incontinence    Bed mobility Current Initial    Supine < > Sit  Level of assist  Dependent assist;Two person assist  Dependent assist;Two person assist (12/15/21 0932)   Device   Through long sitting;Bed rail;Head of bed elevated (12/13/21 1430)   Intervention    Verbal cues;Tactile cues;External cues (12/13/21 1430)   Supine<>sit comments:   Dep x 2 supine<>sit, pivot to EOB. Initiating supine secondary to fatigue. Incontinent of BM while sitting EOB    Balance  Balance deficits noted: Yes  Functional Balance for ADLs  Position Static Dynamic   Sit Static sitting level of assist: Contact guard assist-   Static sitting comment: Patient telling therapist to "go away" when therapist was not touching her Dynamic sitting level of assist: Minimal assist-  Dynamic sitting comment: leaning forward 2/2 discomfort and confusion       Exercise/other interventions  Supine exercises comment (other exercise, details): ankle pumps AAROM bilaterally, hip and knee flexion AAROM bilaterally    Communication between other health care providers: Communication between other health care provider: OT;RN  Communication comment:       Education Careers adviser: Patient  Content: Plan of care  Response: Needs reinforcement  Outcome measures   Physical Therapist Global Assessment of Mobility  Activity Achieved: Sitting edge of bed with any level of assist for > 5 minutes  AMPAC 6-clicks basic mobility score: 8      PLAN  Plan of care status:  Current plan of care remains appropriate  PT frequency:  5x/week  PT duration:  4 weeks.  Comment:  POC expires 2/26      Planned PT interventions:   Specific interventions: Progressive functional mobility training;Gait training;Balance training;NM Re-ed;Aerobic training;Ther ex  Education interventions: Caregiver training;Benefits of activity;Self-pacing/breathing;Exercise program;Mobility with precautions compliance;Fall risk reduction  Comment:          Leotis Shames, PT, DPT, NCS    12/15/2021

## 2021-12-16 LAB — HEPATITIS B E ANTIBODY: Hep B e Ab: NONREACTIVE

## 2021-12-16 LAB — COMPLETE BLOOD COUNT WITH DIFFERENTIAL
Abs Basophils: 0 10*9/L (ref 0.00–0.10)
Abs Eosinophils: 0 10*9/L (ref 0.00–0.40)
Abs Imm Granulocytes: 0.02 10*9/L (ref <0.10)
Abs Lymphocytes: 0.24 10*9/L — ABNORMAL LOW (ref 1.00–3.40)
Abs Monocytes: 0.13 10*9/L — ABNORMAL LOW (ref 0.20–0.80)
Abs Neutrophils: 0.94 10*9/L — CL (ref 1.80–6.80)
Hematocrit: 27.5 % — ABNORMAL LOW (ref 36.0–46.0)
Hemoglobin: 9.1 g/dL — ABNORMAL LOW (ref 12.0–15.5)
MCH: 32.9 pg (ref 26.0–34.0)
MCHC: 33.1 g/dL (ref 31.0–36.0)
MCV: 99 fL (ref 80–100)
MPV: 14.1 fL — ABNORMAL HIGH (ref 9.1–12.6)
Platelet Count: 75 10*9/L — ABNORMAL LOW (ref 140–450)
RBC Count: 2.77 10*12/L — ABNORMAL LOW (ref 4.00–5.20)
RDW-CV: 22.9 % — ABNORMAL HIGH (ref 11.7–14.4)
WBC Count: 1.3 10*9/L — CL (ref 3.4–10.0)

## 2021-12-16 LAB — COMPREHENSIVE METABOLIC PANEL (BMP, AST, ALT, T.BILI, ALKP, TP ALB)
AST: 86 U/L — ABNORMAL HIGH (ref 5–44)
Alanine transaminase: 89 U/L — ABNORMAL HIGH (ref 10–61)
Albumin, Serum / Plasma: 1.5 g/dL — ABNORMAL LOW (ref 3.4–4.8)
Alkaline Phosphatase: 537 U/L — ABNORMAL HIGH (ref 38–108)
Anion Gap: 8 (ref 4–14)
Bilirubin, Total: 13.5 mg/dL — ABNORMAL HIGH (ref 0.2–1.2)
Calcium, total, Serum / Plasma: 7.6 mg/dL — ABNORMAL LOW (ref 8.4–10.5)
Carbon Dioxide, Total: 22 mmol/L (ref 22–29)
Chloride, Serum / Plasma: 112 mmol/L — ABNORMAL HIGH (ref 101–110)
Creatinine: 0.4 mg/dL — ABNORMAL LOW (ref 0.55–1.02)
Glucose, non-fasting: 83 mg/dL (ref 70–199)
Potassium, Serum / Plasma: 3 mmol/L — ABNORMAL LOW (ref 3.5–5.0)
Protein, Total, Serum / Plasma: 3 g/dL — ABNORMAL LOW (ref 6.3–8.6)
Sodium, Serum / Plasma: 142 mmol/L (ref 135–145)
Urea Nitrogen, serum/plasma: 6 mg/dL — ABNORMAL LOW (ref 7–25)
eGFRcr: 113 mL/min/{1.73_m2} (ref >59)

## 2021-12-16 LAB — PROTEIN ELECTROPHORESIS, SERUM
Albumin, sPEP: 1.7 g/dL — ABNORMAL LOW (ref 3.4–4.7)
Alpha 1 Globulin: 0.1 g/dL (ref 0.1–0.3)
Alpha 2 Globulin: 0.2 g/dL — ABNORMAL LOW (ref 0.6–1.0)
Beta Globulin: 0.4 g/dL — ABNORMAL LOW (ref 0.7–1.4)
Gamma Globulin: 0.3 g/dL — ABNORMAL LOW (ref 0.6–1.6)
M-Protein (monoclonal), Serum: 0.2 g/dL — AB
Protein, Total: 2.8 g/dL — ABNORMAL LOW (ref 6.3–8.6)
sPEP Interpretation: ABNORMAL — AB

## 2021-12-16 LAB — POCT GLUCOSE
Glucose, Glucometer: 91 mg/dL (ref 70–199)
Glucose, Glucometer: 73 mg/dL (ref 70–199)

## 2021-12-16 LAB — EPSTEIN-BARR VIRUS BY QUANTITATIVE PCR
EBV by Quantitative PCR, Copy/mL: <390 {copies}/mL
EBV by Quantitative PCR, Interp.: NOT DETECTED
EBV by Quantitative PCR, Log Copy/mL: <2.6 {Log}

## 2021-12-16 LAB — PERIPHERAL BLOOD CULTURE
Peripheral Blood Culture: NO GROWTH
Peripheral Blood Culture: NO GROWTH

## 2021-12-16 LAB — PROTHROMBIN TIME
INR: 1.4 — ABNORMAL HIGH (ref 0.9–1.2)
PT: 16.5 s — ABNORMAL HIGH (ref 11.6–15.0)

## 2021-12-16 LAB — URINE CULTURE: Bacterial Culture, Urine w/o gram stain: >100000 — AB

## 2021-12-16 MED ORDER — LEVOFLOXACIN 500 MG/100 ML IN 5 % DEXTROSE INTRAVENOUS PIGGYBACK
500100 mg/100 mL | Freq: Every day | INTRAVENOUS | Status: DC
Start: 2021-12-16 — End: 2021-12-18
  Administered 2021-12-17: 02:00:00 500 mg via INTRAVENOUS
  Administered 2021-12-17: 16:00:00 via INTRAVENOUS
  Administered 2021-12-18: 17:00:00 500 mg via INTRAVENOUS

## 2021-12-16 MED ORDER — LEVOFLOXACIN 500 MG TABLET
500 | Freq: Every day | ORAL | Status: DC
Start: 2021-12-16 — End: 2021-12-16

## 2021-12-16 MED FILL — POTASSIUM CHLORIDE 40 MEQ/100ML IN STERILE WATER INTRAVENOUS PIGGYBACK: 40 mEq/100 mL | INTRAVENOUS | Qty: 100

## 2021-12-16 MED FILL — GANCICLOVIR SODIUM 500 MG INTRAVENOUS SOLUTION: 500 mg | INTRAVENOUS | Qty: 8

## 2021-12-16 MED FILL — DILAUDID (PF) 0.5 MG/0.5 ML INJECTION SYRINGE: 0.5 mg/ mL | INTRAMUSCULAR | Qty: 0.5

## 2021-12-16 MED FILL — PIPERACILLIN-TAZOBACTAM 4.5 GRAM INTRAVENOUS SOLUTION: 4.5 gram | INTRAVENOUS | Qty: 4500

## 2021-12-16 MED FILL — MICAFUNGIN 100 MG INTRAVENOUS SOLUTION: 100 mg | INTRAVENOUS | Qty: 100

## 2021-12-16 NOTE — Progress Notes (Signed)
[facility] PROGRESS NOTE  ATTENDING ONLY     My date of service is 12/16/21.    24 Hour Course  - GI consult today re: scope  - GI PCR panel ordered  - LP to assess for CMV encephalitis and other causes  - MRI brain pending   - stopped zosyn given cx negative   - Path pending on liver biopsy  - Daughter is at bedside, answered questions    Subjective  Patient is not following commands, is able to communicate slightly more clearly today    Vitals  Temp:  [36.1 C (97 F)-37.1 C (98.8 F)] 36.3 C (97.3 F)  Heart Rate:  [63-81] 81  *Resp:  [16-22] 22  BP: (94-115)/(61-77) 115/77  SpO2:  [95 %-98 %] 95 %    MostRecent Weight: 88.1 kg (194 lb 3.6 oz)  Admission Weight: 79.6 kg (175 lb 7.8 oz)      Intake/Output Summary (Last 24 hours) at 12/16/2021 1218  Last data filed at 12/16/2021 1100  Gross per 24 hour   Intake 3014 ml   Output 2850 ml   Net 164 ml       Pain Score: Eyes closed, patient calm    Physical Exam  Appearance: She is ill-appearing.      Comments: Alert and oriented X 1-2 slow to respond. Confused.    HENT:      Head: Normocephalic and atraumatic.      Nose: Nose normal.   Neck:  R IJ CVC with dressing in place, c/d/i  Eyes:      General: Scleral icterus present.      Extraocular Movements: Extraocular movements intact.      Conjunctiva/sclera: Conjunctivae normal.      Pupils: Pupils are equal, round, and reactive to light.   Cardiovascular:      Rate and Rhythm: Normal rate and regular rhythm.      Pulses: Normal pulses.      Heart sounds: Normal heart sounds. No murmur heard.    No friction rub. No gallop.   Pulmonary:      Effort: Pulmonary effort is normal. No respiratory distress.      Breath sounds: Normal breath sounds. No stridor. No wheezing, rhonchi or rales.   Abdominal:      General: Abdomen is flat. Bowel sounds are normal. There is no distension.      Palpations: Abdomen is soft. There is no mass.      Tenderness: There is no abdominal tenderness. There is no guarding or  rebound.      Hernia: No hernia is present.      Comments: Foley catheter in place, urine appears dark  No suprapubic tenderness.   Musculoskeletal:         General: Normal range of motion.      Cervical back: Normal range of motion and neck supple.      Right lower leg: Edema present.      Left lower leg: Edema present.      Comments: Strength 1/5 in the bilateral lower extremities, unable to raise them to gravity.   Pitting edema 3-4+ in the bilateral lower extremities.   Anasarca in the abdomen and thighs bilaterally.    Skin:     General: Skin is warm.      Capillary Refill: Capillary refill takes less than 2 seconds.      Coloration: Skin is jaundiced.      Findings: Bruising present.  Comments: ecchymoses on the sternum   Neurological:      Mental Status: She is alert. She is disoriented.      Motor: Weakness present.       Scheduled Meds:   0.9% sodium chloride flush  10-20 mL Intravenous Q12H [place]    0.9% sodium chloride flush  3 mL Intravenous Q12H [place]    dry mouth oral rinse  5 mL Mucous Membrane 4x Daily [place]    ganciclovir (CYTOVENE) IVPB  5 mg/kg (Dosing Weight) Intravenous Q12H [place]    micafungin  100 mg Intravenous Q24H     Continuous Infusions:   dextrose 5 % and 0.9 % sodium chloride 75 mL/hr (12/16/21 0422)     PRN Meds:   0.9% sodium chloride flush  10-20 mL Intravenous PRN    0.9% sodium chloride flush  3 mL Intravenous PRN    dextrose 50%  12.5 g Intravenous Q15 Min PRN    Or    glucose  20 g Oral Q15 Min PRN    Or    juice (for hypoglycemia)  16 g Oral Q15 Min PRN    HYDROmorphone  0.3-0.8 mg Intravenous Q4H PRN    magnesium sulfate in dextrose 5 %  2-4 g Intravenous Daily PRN    Or    magnesium sulfate in water  2-4 g Intravenous Daily PRN    metoclopramide  5-10 mg Intravenous Q8H PRN    ondansetron  8 mg Intravenous Q8H PRN    polyethylene glycol  17 g Oral Daily PRN    potassium chloride in sterile water  20-80 mEq Intravenous Daily PRN    Or    potassium chloride in  sterile water  20-80 mEq Intravenous Daily PRN    Or    potassium chloride  20-80 mEq Oral Daily PRN       Data    CBC        12/16/21  0201 12/15/21  0248   WBC 1.3* 1.0*   HGB 9.1* 8.7*   HCT 27.5* 26.3*   PLT 75* 62*     Coags        12/16/21  0201 12/15/21  0248   INR 1.4* 1.5*     Chem7        12/16/21  0201 12/15/21  0248   NA 142 143   K 3.0* 3.1*   CL 112* 116*   CO2 22 22   BUN 6* 8   CREAT 0.40* 0.43*   GLU 83 84     Electrolytes        12/16/21  0201 12/15/21  0248   CA 7.6* 7.6*     Liver Panel        12/16/21  0201 12/15/21  0248   AST 86* 77*   ALT 89* 83*   ALKP 537* 475*   TBILI 13.5* 12.5*   TP 3.0* 2.9*   ALB 1.5* 1.5*     Lactate  No results found in last 36 hours    Microbiology Results (last 24 hours)     Procedure Component Value Units Date/Time    Cytomegalovirus DNA, Quantitative, Non-plasma samples [[id number]]     Order Status: Sent Specimen: Not Applicable from CSF     Rapid HSV DNA, CSF [[id number]]     Order Status: Sent Specimen: Not Applicable from CSF     Clostridium Difficile [[id number]] Collected: 12/16/21 0945    Order Status: Sent Specimen: Not Applicable from Stool  Urine Culture [[id number]]  (Abnormal) Collected: 12/14/21 1145    Order Status: Completed Specimen: Not Applicable from Urine, Midstream Updated: 12/16/21 0850     Bacterial Culture, Urine w/o gram stain >100,000 Candida albicans    Cytomegalovirus DNA, Quantitative PCR, Plasma [[id number]] Collected: 12/16/21 0201    Order Status: Sent Specimen: Not Applicable from Serum Updated: 12/16/21 0225    Central Blood Culture [[id number]] Collected: 12/13/21 0350    Order Status: Completed Specimen: Not Applicable from Central Blood Updated: 12/16/21 0200     Central Blood Culture No growth 3 days.    Central Blood Culture [[id number]] Collected: 12/13/21 0351    Order Status: Completed Specimen: Not Applicable from Central Blood Updated: 12/16/21 0200     Central Blood Culture No growth 3 days.    Peripheral Blood Culture  [[id number]] Collected: 12/10/21 0209    Order Status: Completed Specimen: Not Applicable from Peripheral Blood Updated: 12/16/21 0158     Peripheral Blood Culture No growth 6 days.    Peripheral Blood Culture [[id number]] Collected: 12/10/21 0211    Order Status: Completed Specimen: Not Applicable from Peripheral Blood Updated: 12/16/21 0158     Peripheral Blood Culture No growth 6 days.    Epstein-Barr Virus by Quantitative PCR [[id number]] Collected: 12/11/21 1618    Order Status: Completed Specimen: Not Applicable from Serum Updated: 12/15/21 2207     EBV Virus by Quantitative PCR, Source PLASMA     EBV by Quantitative PCR, Copy/mL <390 cpy/mL      EBV by Quantitative PCR, Log Copy/mL <2.6 log      Comment: (NOTE)  INTERPRETIVE INFORMATION: [place] Patient Virus by Quantitative PCR  The quantitative range of this assay is 2.6-7.6 log copies/mL   (390-39,000,000 copies/mL).  A negative result (less than 2.6 log copies/mL or less than 390   copies/mL) does not rule out the presence of PCR inhibitors in the   patient specimen or EBV DNA nucleic acid in concentrations below   the level of detection of the assay. Inhibition may also lead to   underestimation of viral quantitation.  Caution should be taken when interpreting results generated by   different assay methodologies.  This test was developed and its performance characteristics   determined by [facility]. It has not been cleared or   approved by the [place] Food and Drug Administration. This test was   performed in a CLIA certified laboratory and is intended for   clinical purposes.          EBV by Quantitative PCR, Interp. Not Detected     Comment: (NOTE)  NOT DETECTED - A negative result does not rule out the   presence of PCR inhibitors in the patient specimen or   assay specific nucleic acid in concentrations below the   level of detection by the assay.  Performed by [facility],  [address], [place] [address] [phone]  [email or web address], Jonathan R.  Genzen, MD, PHD, Lab. Director               Radiology Results  No results found.    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 The patient has received VRd X 4 (achieving VGPR) then Mel 200 ASCT 05/12/11. 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 She met the criteria for PD on 02/19/2020 and was started on third line DARA PD in June 2021, which was  recently held on 10/20/21 due to chronic diarrhea. Patient was transferred OSH after recent hypovolemic/distributive shock. Here for acute encephalopathy workup and acute liver failure workup.      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   June 2020 due to progression of M spike Revlimid was increased to 15 mg PO D1-21 every 28 days, plus dexamethasone  July 2021: started daratumumab, Pomylast 2 mg D 1-21, Q28 days, dexamethasone 12 mg PO Q weekly.   Primary oncologist: Dr. Iona Coach,  Dr. Kennon Rounds [facility]  Receiving dexamethasone 12 mg weekly for diarrhea  Receiving MT with daratumumab with pomalyst and dex, most recent dose 10/16/21, has since been on hold due to worsening diarrhea.     DATA:   09/07/2010: IgG Kappa MM  12/02/2021: Ig M <5, IgG 447, IgA 20, Ig E <2, SFLC Kappa 16.6, Lambda 1.6, Kappa/Lamda 10.38  12/14/21: IgG K on IFE, IgG 430, KLC 16.6, LLC 2.7, KLR 6.15    Chemo:   Holding Daratumumab and Pomalyst, last dose 10/20/21, given recent infection and acute liver failure.    Supportive Care:   Transfusion: Keep Hg >7, and Platelets >10.     Per daughter, has autoantibodies in blood was a difficult match. Would like to be contacted prior to transfusion.     Line: PICC placed 1/26    Outpatient Oncologist: Dr. Kennon Rounds    Dispo: TBD, after  resolution of sepsis and liver failure    # Immunocompromised  #CMV colitis  # At risk for infections due to malignancy  # Shock, likely hypovolemic vs distributive in the setting of chronic diarrhea, + norovirus infection   # Acute on chronic diarrhea, norovirus positive., diff and OP negative.   -No prior evidence of adrenal insufficiency, AM cortisol levels normal, previously on dexamethasone 8 mg PO Qweekly, but has been held since early December. CMV PCR on 1/    Data:  - Ucx: pending  - Bcx: pending  - CT brain: No acute intracranial hemorrhage, herniation, or hydrocephalus.  - CT chest/A/P pending: Medium-sized bilateral pleural effusions with bilateral peribronchovascular groundglass opacities, suggestive of pulmonary edema. Clustered nodules in right middle lobe along the right minor fissure, which could represent mild aspiration or infection. Numerous sclerotic and mixed lytic/sclerotic lesions throughout the thoracic spine and bilateral ribs, increased in size and number compared to prior. Appearance is atypical for multiple myeloma but may represent a combination of treated, partially treated, and active disease. No acute pathologic fracture. Findings compatible with enterocolitis of infectious or inflammatory etiology. Involvement of the colon is greater than of the small bowel. Marked hepatic steatosis. Increased sclerosis of bone lesions throughout the thoracolumbar spine, bony pelvis, and proximal left femur. Appearance is atypical for multiple myeloma but may represent a combination of treated, partially treated, and active disease. No acute pathologic fracture.  - 1/29: [id number]    Plan:  - Ganciclovir 5 mg/kg (1/31-)   - appreciate ID reccs   - GI consult: appreciate reccs: GI PCR panel pending, will discuss role of scoping   - MRI brain    - LP with TAPS on 12/16/21   - stop zosyn: s/p IV Zosyn 4.5 g Q8hrs (1/26-1/28, restarted 1/29 given recurrent fever-12/16/21)  -Monitor I/O's  - OI ppx:  Acyclovir  IV    # Grade III neutropenia, at baseline  # Leukopenia  #Thrombocytopenia (possibly 2/2 liver disease)  Data: WBC of 2.2 on 12/05/21, on Zarxio at home  Plan: Continue to monitor    # Acute liver failure of unclear etiology  # Acute on chronic encephalopathy likely 2/2 infectious vs hepatic vs metabolic  # Hyperbilirubinemia  # Anasarca  # Hypoalbuminemia    Data:   -Steady rise in LFTs in 05/2021,  -12/02/21: AST 152--> 121, ALT 130--> 110, TB 5.4, Albumin 1.1  -Hepatitis panel negative.   -1/26:  Tbili 14.6, GGT 1160, ammonia 25  -CTAP 12/02/21: Scattered sclerotic lesions in the lumbar spine, hepatic steatosis.   -RUQ ultrasound: liver enlarged with diffuse increased echogenicity, consistent with fatty infiltration. 2cm stone in the gallbladder.   -HIDA scan: Normal radiotracer filling of the gallbladder is identified, therefore the exam is not consistent with cystic duct obstruction or acute cholecystitis.    [place] abd 1/27:   Hepatomegaly with diffusely increased echogenicity which can be seen with hepatic amyloidosis or steatosis. Please refer to the recently performed biopsy for further details.     2.  Mild perihepatic ascites.    3.  Patent hepatic vasculature with normal waveforms.    4.  Cholelithiasis without acute cholecystitis.    5.  Right pleural effusion.    Portal pressures:  Mean right atrial pressure (mmHg): 4  Mean inferior vena cava pressure (mmHg): 5  Mean free hepatic vein pressure (mmHg): 8   Mean wedged hepatic vein pressure (mmHg): 16     - Follow-up with the pending studies:  Ferritin and iron studies  HEV IgG/IgM  PCR forVZV, HSV,CMV,EBV  Please send AMA,IgG4  Hepatitis A IgG  Hepatitis B core Ab, surface Ab     Plan:   [ ]  Hepatology consult, follow up reccs.   [ ]  Liver bx 1/27, follow up patholoy  - daily LFTS/INR    #AMS  In the setting of liver failure, ammonia only 25.     Data:   1/31: HIV,  TSH, Folate, B12 pending  1/30: CT head WNL other than sinus disease    Plan:  - given CMV elevation, c/f CMV encephalitis, ID c/s to discuss need for further work up/changes to therapy [ ]    - sending reversible causes of dementia w/up  - consider neurology c/s    # History of PE, diagnosed in April 2019,   - hold home [place] for now given recent anemia and concerns for bleeding.    -monitor for signs and symptoms of worsening bleeding.     # Elevated TSH at OSH, likely 2/2 subclinical hypothyroidism  -repeat TSH when acute sickness resolved.     # Anxiety, emotional distress:   -Patient has stressful social situation, intellectually disabled son, and regarding underlying disease process.     #Chronic buttocks pressure wound  -Wound care consult, appreciate recs  -Position changes as tolerated, waffle cushion, etc    #VTE PPx:  Contraindicated: Anticipated procedure    Severity of Illness     During this hospitalization the patient is also being treated for:  - Sepsis (present on admission)    - Malignancy associated fatigue on admission  - Neoplasm/malignancy associated pain  - Hypoalbuminemia (present on admission)  - Fluid status: Hypovolemia on admission     - Cachexia  - Encephalopathy: Metabolic encephalopathy   --Pressure wound ruled out, treating bilateral buttocks moisture and friction damage  Code Status: FULL    Farrel Conners, MD  12/16/21

## 2021-12-16 NOTE — Plan of Care (Signed)
Pt AxOx1 (oriented to self). AVSS thus far into shift. Per checklist of nonverbal pain indicators, pt appeared uncomfortable. PRN Dilaudid given x1. Pt appeared to be resting comfortably upon reassessment. Turning pt q2 hrs. Will CTM.    Problem: Fall, at Risk or Actual - Adult  Goal: Absence of falls and fall related injury  Outcome: Progress within 12 hours  Goal: Knowledge of fall prevention  Outcome: Progress within 12 hours     Problem: Delirium - Adult / Pediatric  Goal: Absence or resolution of delirium  Outcome: Progress within 12 hours

## 2021-12-16 NOTE — Consults (Signed)
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 The patient has received VRd X 4 (achieving VGPR) then Mel 200 ASCT 05/12/11. 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 She met the criteria for PD on 02/19/2020 and was started on third line DARA PD in June 2021, which was recently held on 10/20/21 due to chronic diarrhea. Patient was transferred OSH after recent hypovolemic/distributive shock. Here for acute encephalopathy workup and acute liver failure workup.    Assessment and Treatment Plan    OT Progress summary: Patient tolerared OT session well today. She was RASS -1 to 0 with upright sitting, tolerated 15 minutes EOB, able to follow simple 1 step commands w/ delayed processing and response time, she presents w/ poor sustained attention and impaired motor planning. She was able to stand w/ max A x2 w/ STEDY. Will continue to follow to track cognitive progress and maximize independence w/ meaningful tasks. Patient left seated UIC w/ posey belt and hoyer lift pad in place, RN aware.  Progress with OT: Good progress  Current maximal level of assist: Dependent assist  Next session focus:      Recommendations    Recommended Discharge Disposition Placement for continued therapy   Discharge DME Recommendations Defer to next level of care;To be determined   Equipment Recommendations     DME Comment     Discharge Recommendations Comment     Rehab Potential Patient participates well in therapy and is progressing towards goal   Anticipated Assistance Available at Discharge Family;Spouse/Significant other   Anticipated Type of Assistance Available at Discharge Assist as needed   Anticipated Time of Assistance Available at Discharge     Barriers to Discharge Medical issues;Impaired Cognition;Insufficient activity tolerance   Patient's current functional ability appropriate for D/C  recommendations No     Inpatient Recommendations  OT Inpatient Recommendations Bed in chair position;Encourage participation in ADLs;Delirium precautions   Recommendation Comments OOB 2-3x/day w/ sling   Current Maximal Level of Assist Needed Dependent assist     Brace/Precautions   Brace/Orthotic/Prosthetic:No  Precautions:Has weight bearing limitation or precaution: Yes  Precautions and weight bearing status comments: delrium, falls     Subjective Report:     Patient/Family Goal:     Objective Findings and Interventions    Areas of Occupation    Grooming and Light Hygiene Maximal assistance   Cueing Required     Comment to comb hair, able to comb front w/ min A for initiation, max A to complete task and reach back of head, min A to apply chapstick   Self Feeding     Cueing Required     Comment     Toileting     Cueing Required     Toileting Clothing Management     Cueing Required     Comment     Upper Body Dressing     Cueing Required     Comment     Lower Body Dressing Maximal assistance   Cueing Required     Comment don socks   Upper Body Bathing     Cueing Required     Comment     Lower Body Bathing     Cueing Required     Comment     ADL/IADL Comment grossly max A for ADLs       Functional Transfers  Bed Mobility From Supine   Bed Mobility To Sitting EOB   Level of Assist Maximal assistance (x2)   Cueing Required     Transfer From Sit   Transfer To Stand   Level of Assist Maximal assistance (x2)   Cueing Required     Technique     Functional Transfer Comment tolerated EOB ~ 15 minutes w/ RASS -1 to periods of 0, max A x2 STS w/ STEDY     Functional Balance for ADLs  Position Static Dynamic   Sit Static sitting level of assist: Contact guard assist-     Dynamic sitting level of assist: Moderate assist-  Dynamic sitting comment: to scoot EOB     Stand Static Standing level of assist: Maximal assist-   Static standing comment: in [place] for ADLs  6 Click Score: 11    Client  Factors  Communication comment:      Hearing comment:      Cognitive deficits noted in the following area(s):: Attention, Memory, Judgement/Safety, Arousal  Cognition comment:RASS -1 to 0, following commands 75% of the time, poor sustained attention requiring cues for redirection, encouraged visual scanning and object identification to orient to space    Initiation;Organization;Sequencing;Problem ID;Problem solving;Information processing;Safety awareness  Executive functions comment: grossly impaired                                              Psychosocial comment (coping, adherence): +encephalopathic, poor motor planning    OT Education  Education  Learner: Patient  Content: Plan of care  Response: Needs reinforcement  Communication between other health care provider: PT;RN    OT Plan of [place], OT  12/16/2021 12:23 PM

## 2021-12-16 NOTE — Consults (Signed)
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     HPI:   Ariel Braun is a 61 year old female with MM in 2012, currently on daratumumab, pomylast and dex (last 10/2021), PE previously on Eliquis who was admitted [facility] on [facility] with acute on chronic diarrhea, fatigue found to have norovirus c/b hypovolemic shock requiring ICU admission transferred to [facility] for further care fth elevated LFT, encephalopathy and plasma CMV 4440000    Today, pt's daughter reported that Ariel Braun normally has diarrhea for 1-2 weeks after COVID vaccine. Since her last COVID booster shot in 06/2021, the diarrhea has been more persistent. In 09/2021, she was tested +norovirus. However, continues to have diarrhea. Patient was offered colonoscopy in the outpt setting but declined as she wanted to focus on her son's health. She then had worsening fatigue in 11/2021 so she presented to [facility] on 12/02/21. Her hospital course was complicated by ICU admission for hypovolemic shock intermittently requiring pressor support (phenylephrine gtt).  She was found to be pancytopenic with hg of 6.6 and Lactate of 11.  Patient was found to be positive for norovirus. LFTs were elevated (AST 118, [name], Alp 958, TB 12.1), procal 1.2. MRI of the liver with MRCP was done which showed hepatic steatosis. RUQ ultrasound showed an enlarged liver with large 2cm stone in the gallbladder. HIDA scan was done: findings were not consistent with cystic duct obstruction or acute cholecystitis. She was then transferred to [facility] for further work up. Has transjugular liver biopsy on 01/27 with path pending. Her CMV from 01/28 was 444k.     Since admitted to [place], she continues to have diarrhea though improved within the last 2 days (max Bms 7  daily and only 1BMs  for the last 2 days). Last fever 38.6 on 01/28. Labs are notable for hypokalemia in low 3s, WBC 1.3 with ANC 0.97, Hgb 8.7. CTAP showed diffuse bowel wall edema and surrounding stranding involving the colon and to a lesser extent the small bowel.       Past Medical History:  Past Medical History:   Diagnosis Date    Acid reflux disease     Anxiety     Depression     Hereditary angioedema (CMS code)     Multiple myeloma, without mention of having achieved remission     IgG kappa    Myeloma (CMS code) 05/10/2011       Past Surgical History:   Procedure Laterality Date    AUTOLOGOUS STEM CELL TRANSPLANTATION  05/12/11    BREAST CYST EXCISION  1981        Medications:  Current Facility-Administered Medications   Medication Dose Route Frequency Provider Last Rate Last Admin    0.9% sodium chloride flush injection syringe 10-20 mL  10-20 mL Intravenous Q12H [facility] Zena Amos, DO   10 mL at 12/16/21 1000    0.9% sodium chloride flush injection syringe 10-20 mL  10-20 mL Intravenous PRN Waynetta Sandy Karr, DO        0.9% sodium chloride flush injection syringe 3 mL  3 mL Intravenous Q12H [place] Anousheh Afjei, MD        0.9% sodium chloride flush injection syringe 3 mL  3 mL Intravenous PRN Carmine Savoy, MD  dextrose 5 % and 0.9 % sodium chloride infusion  75 mL/hr Intravenous Continuous Zena Amos, DO 75 mL/hr at 12/16/21 0422 75 mL/hr at 12/16/21 0422    dextrose 50% injection syringe 12.5 g  12.5 g Intravenous Q15 Min PRN Zena Amos, DO   12.5 g at 12/14/21 1133    Or    glucose chewable tablet 20 g  20 g Oral Q15 Min PRN Zena Amos, DO        Or    juice (for hypoglycemia) 16 g  16 g Oral Q15 Min PRN Waynetta Sandy Karr, DO        dry mouth oral rinse (BIOTENE DRY MOUTH) solution 5 mL  5 mL Mucous Membrane 4x Daily [facility] Farrel Conners, MD   5 mL at 12/16/21 1300    ganciclovir (CYTOVENE) 400 mg in dextrose 5% 100 mL IVPB  5  mg/kg (Dosing Weight) Intravenous Q12H [facility] Farrel Conners, MD   Ended at 12/16/21 1156    HYDROmorphone (DILAUDID) injection syringe 0.3-0.8 mg  0.3-0.8 mg Intravenous Q4H PRN Deboraha Sprang, MD   0.5 mg at 12/16/21 1446    levofloxacin in dextrose 5 % (LEVAQUIN) 500 mg/100 mL IVPB 500 mg  500 mg Intravenous Daily [facility] Deboraha Sprang, MD        magnesium sulfate in dextrose 5 % 1 g/100 mL IVPB 2-4 g  2-4 g Intravenous Daily PRN Bernestine Amass, MD        Or    magnesium sulfate in water 2 gram/50 mL (4 %) IVPB 2-4 g  2-4 g Intravenous Daily PRN Bernestine Amass, MD        metoclopramide (REGLAN) injection 5-10 mg  5-10 mg Intravenous Q8H PRN Bernestine Amass, MD   10 mg at 12/12/21 2102    micafungin (MYCAMINE) 100 mg in sodium chloride 0.9 % 100 mL IVPB (Minibag Plus)  100 mg Intravenous Q24H Deboraha Sprang, MD   Ended at 12/15/21 1847    ondansetron (ZOFRAN) injection 8 mg  8 mg Intravenous Q8H PRN Zena Amos, DO   8 mg at 12/14/21 4035    polyethylene glycol (MIRALAX) packet 17 g  17 g Oral Daily PRN Carmine Savoy, MD        potassium chloride in sterile water 20 mEq/50 mL IVPB 20-80 mEq  20-80 mEq Intravenous Daily PRN Bernestine Amass, MD 50 mL/hr at 12/13/21 0521 40 mEq at 12/13/21 2481    Or    potassium chloride in sterile water 40 mEq/100 mL IVPB 20-80 mEq  20-80 mEq Intravenous Daily PRN Bernestine Amass, MD 50 mL/hr at 12/16/21 0618 40 mEq at 12/16/21 8590    Or    potassium chloride (KLOR-CON) ER tablet 20-80 mEq  20-80 mEq Oral Daily PRN Bernestine Amass, MD           Allergies:   She is allergic to caffeine and diphenoxylate-atropine.    Social History:  Social History     Socioeconomic History    Marital status: Married     Spouse name: Not on file    Number of children: Not on file    Years of education: Not on file    Highest education level: Not on file   Occupational History    Not on file   Tobacco Use    Smoking status: Unknown    Smokeless tobacco: Not on file   Substance and Sexual Activity    Alcohol  use: Not on file  Drug use: Not on file    Sexual activity: Not Currently   Other Topics Concern    Not on file   Social History Narrative    Not on file     Social Determinants of Health     Financial Resource Strain: Not on file   Food Insecurity: Not on file   Transportation Needs: Not on file       Family History:   Her family history includes Colon cancer in her father; Diabetes in her mother; Hypertension in her brother and mother.   Father diagnosed with colon cancer at the age of 30    ROS:    Physical Exam:  Vital Signs:  BP 90/64 (BP Location: Right upper arm, Patient Position: Lying)   Pulse 85   Temp 37.1 C (98.8 F) (Axillary)   Resp 18   Ht 153 cm (5' 0.24")   Wt 88.1 kg (194 lb 3.6 oz)   LMP  (LMP Unknown)   SpO2 95%   BMI 37.64 kg/m   General appearance:  Lying in bed in NAD, though somnolent. Opened eyes to voice. Jaundiced  HEENT:  Sclera icteric, dry MM  Cardiopulmonary: breathing comfortably on room air  Abdomen:  Soft, non-distended. Tender to palpation on the RUQ    Extremities: lower extremity edema +2 bilaterally  Neuro:  Opened eyes to voice. Unable to answer questions. Moving extremities spontaneously     Data:   I/Os: I/O last 3 completed shifts:  In: 4848 [P.O.:440; I.V.:4408]  Out: 4200 [Urine:4200]    Labs:  I have personally reviewed laboratory studies available in Epic. They are notable for:  Recent Labs     12/16/21  0201 12/15/21  0248 12/14/21  0305   NA 142 143 144   K 3.0* 3.1* 3.5   CL 112* 116* 117*   CO2 22 22 21*   BUN 6* 8 8   CREAT 0.40* 0.43* 0.43*   CA 7.6* 7.6* 7.7*     Recent Labs     12/16/21  0201 12/15/21  0248 12/14/21  0305   ALT 89* 83* 89*   AST 86* 77* 78*   ALKP 537* 475* 504*   TBILI 13.5* 12.5* 12.8*     Recent Labs     12/16/21  0201 12/15/21  0248 12/14/21  0305   WBC 1.3* 1.0* 1.3*   HGB 9.1* 8.7* 9.2*   HCT 27.5* 26.3* 28.1*   MCV 99 101* 100   PLT 75* 62* 69*     Recent Labs     12/16/21  0201 12/15/21  0248 12/14/21  0305   INR 1.4* 1.5*  1.4*     No results for input(s): LACTWB in the last 168 hours.    Microbiology:  Microbiology Results (last 72 hours)     Procedure Component Value Units Date/Time    Cytomegalovirus DNA, Quantitative PCR, Plasma [[id number]]  (Abnormal) Collected: 12/16/21 0201    Order Status: Completed Specimen: Not Applicable from Serum Updated: 12/16/21 1617     CMV DNA (IU/mL) 179,555 IU/mL      Comment: *SUBCRITICAL*  Performed by Real-Time PCR  Note new reference range.          CMV DNA (Log IU/mL) 5.25     Comment: Log10 IU/mL  Note new reference range.         GI Parasite Panel PCR [[id number]]     Order Status: Completed Specimen: Not Applicable from Stool  GI Viral Panel PCR [[id number]]     Order Status: Completed Specimen: Not Applicable from Stool     GI Bacteria Panel PCR [[id number]]     Order Status: Completed Specimen: Not Applicable from Stool     Clostridium Difficile [[id number]] Collected: 12/16/21 0945    Order Status: Sent Specimen: Not Applicable from Stool, Loose Updated: 12/16/21 1424    Hepatitis B e Antibody [[id number]] Collected: 12/13/21 0302    Order Status: Completed Specimen: Blood from Serum Updated: 12/16/21 1345     Hep B e Ab Nonreactive     Comment: (NOTE)  This result should be assessed in conjunction with the patient's  medical history, clinical examination and other hepatitis B  serological markers to determine disease state.     This assay is not specifically validated for patients under 2 years  of age.    Methodology: [place] ID [id number].    Performed by: [place], [place]  [address]         GI Bacteria Panel PCR [[id number]]     Order Status: Canceled Specimen: Not Applicable from Stool     GI Viral Panel PCR [[id number]]     Order Status: Canceled Specimen: Not Applicable from Stool     GI Parasite Panel PCR [[id number]]     Order Status: Canceled Specimen:  Not Applicable from Stool     Cytomegalovirus DNA, Quantitative, Non-plasma samples [[id number]]     Order Status: Sent Specimen: Not Applicable from CSF     Rapid HSV DNA, CSF [[id number]]     Order Status: Sent Specimen: Not Applicable from CSF     Urine Culture [[id number]]  (Abnormal) Collected: 12/14/21 1145    Order Status: Completed Specimen: Not Applicable from Urine, Midstream Updated: 12/16/21 0850     Bacterial Culture, Urine w/o gram stain >100,000 Candida albicans    Central Blood Culture [[id number]] Collected: 12/13/21 0350    Order Status: Completed Specimen: Not Applicable from Central Blood Updated: 12/16/21 0200     Central Blood Culture No growth 3 days.    Central Blood Culture [[id number]] Collected: 12/13/21 0351    Order Status: Completed Specimen: Not Applicable from Central Blood Updated: 12/16/21 0200     Central Blood Culture No growth 3 days.    Peripheral Blood Culture [[id number]] Collected: 12/10/21 0209    Order Status: Completed Specimen: Not Applicable from Peripheral Blood Updated: 12/16/21 0158     Peripheral Blood Culture No growth 6 days.    Peripheral Blood Culture [[id number]] Collected: 12/10/21 0211    Order Status: Completed Specimen: Not Applicable from Peripheral Blood Updated: 12/16/21 0158     Peripheral Blood Culture No growth 6 days.    Epstein-Barr Virus by Quantitative PCR [[id number]] Collected: 12/11/21 1618    Order Status: Completed Specimen: Not Applicable from Serum Updated: 12/15/21 2207     EBV Virus by Quantitative PCR, Source PLASMA     EBV by Quantitative PCR, Copy/mL <390 cpy/mL      EBV by Quantitative PCR, Log Copy/mL <2.6 log      Comment: (NOTE)  INTERPRETIVE INFORMATION: [place] Patient Virus by Quantitative PCR  The quantitative range of this assay is 2.6-7.6 log copies/mL   (390-39,000,000 copies/mL).  A negative result (less than 2.6 log copies/mL or less than 390   copies/mL) does not rule out the presence of PCR inhibitors in the  patient specimen or  EBV DNA nucleic acid in concentrations below   the level of detection of the assay. Inhibition may also lead to   underestimation of viral quantitation.  Caution should be taken when interpreting results generated by   different assay methodologies.  This test was developed and its performance characteristics   determined by [facility]. It has not been cleared or   approved by the [place] Food and Drug Administration. This test was   performed in a CLIA certified laboratory and is intended for   clinical purposes.          EBV by Quantitative PCR, Interp. Not Detected     Comment: (NOTE)  NOT DETECTED - A negative result does not rule out the   presence of PCR inhibitors in the patient specimen or   assay specific nucleic acid in concentrations below the   level of detection by the assay.  Performed by [facility],  [address] [place] [address] [phone]  [email or web address], Jonathan R. Genzen, MD, PHD, Lab. Director         Cytomegalovirus DNA, Quantitative PCR, Plasma [[id number]]  (Abnormal) Collected: 12/13/21 0302    Order Status: Completed Specimen: Not Applicable from Serum Updated: 12/14/21 1746     CMV DNA (IU/mL) 443,961 IU/mL      Comment: *SUBCRITICAL*  Performed by Real-Time PCR  Note new reference range.          CMV DNA (Log IU/mL) 5.65     Comment: Log10 IU/mL  Note new reference range.         Urine Culture [[id number]]  (Abnormal) Collected: 12/13/21 1329    Order Status: Completed Specimen: Not Applicable from Urine, Indwelling Catheter Not in Vacutainer Updated: 12/14/21 1519     Bacterial Culture, Urine w/o gram stain >100,000 Presumptive Candida albicans (Definitive identification performed on previous culture from  12/10/21.)    Hepatitis A virus Antibody IgG [[id number]]  (Abnormal) Collected: 12/11/21 1618    Order Status: Completed Specimen: Not Applicable from Serum Updated: 12/14/21 1006     Hep A Ab IgG POS    Hepatitis B Surface Antibody, Quantitative [[id number]] Collected:  12/11/21 1618    Order Status: Completed Specimen: Not Applicable from Serum Updated: 12/14/21 1006     Hep B surf Ab, Quant 12 mIU/mL      Comment: Reference Range:  < 8 mIU/mL: Not Immune  8-11 mIU/mL: Equivocal, questionable immunity  12 mIU/mL or greater: Immune         Hepatitis B Surface Antigen [[id number]] Collected: 12/11/21 1618    Order Status: Completed Specimen: Blood from Serum Updated: 12/14/21 0959     Hep B surf Ag NEG    Hepatitis B Core Antibody, Total [[id number]] Collected: 12/11/21 1618    Order Status: Completed Specimen: Not Applicable from Serum Updated: 12/14/21 0959     Hep B Core Ab Total NEG          Radiology:   Radiology Results (last 72 hours)     Procedure Component Value Units Date/Time    MR Brain with and without Contrast [[id number]]     Order Status: Sent     CT Chest with Contrast [[id number]] Collected: 12/14/21 1806    Order Status: Completed Updated: 12/15/21 1639    Narrative:      CT CHEST WITH CONTRAST    12/14/2021 5:31 PM    CLINICAL HISTORY:  Fever of unknown origin.     ADDITIONAL HISTORY: History of multiple  myeloma.    COMPARISON:   CT 01/01/2020.    TECHNIQUE: Serial 1.25 mm axial images through the chest were obtained after the administration of intravenous contrast.    Iohexol 350 - 120 mL - Intravenous    RADIATION DOSE INDICATORS:  Exposure Events: 3 , CTDIvol Min: 15.3 mGy, CTDIvol Max: 15.3 mGy, DLP: 1009.5 mGy.cm. The following accession numbers are related to this dose report [id number]    FINDINGS:    LUNGS:  Bilateral peribronchovascular groundglass opacities. Clustered nodules in right middle lobe along the right minor fissure.    PLEURA:  Medium-sized bilateral pleural effusions.    MEDIASTINUM:   No mediastinal lymphadenopathy by CT size criteria.    HEART/GREAT VESSELS:  Left upper extremity PICC with tip in the right atrium. Normal heart size. No pericardial effusion. Normal caliber thoracic aorta and main pulmonary artery.    BONES/SOFT  TISSUES:  Numerous sclerotic and mixed lytic/sclerotic lesions throughout the thoracic spine and bilateral ribs, increased in size and number compared to prior. No acute pathologic fracture.    VISIBLE ABDOMEN:  Please see dedicated Abdomen and Pelvis CT report.      Impression:      1.  Medium-sized bilateral pleural effusions with bilateral peribronchovascular groundglass opacities, suggestive of pulmonary edema.    2.  Clustered nodules in right middle lobe along the right minor fissure, which could represent mild aspiration or infection.    3.  Numerous sclerotic and mixed lytic/sclerotic lesions throughout the thoracic spine and bilateral ribs, increased in size and number compared to prior. Appearance is atypical for multiple myeloma but may represent a combination of treated, partially treated, and active disease. No displaced pathologic fracture.    Report dictated by: Chilton Greathouse, MD, signed by: Jacki Cones  Department of Radiology and Biomedical Imaging    CT Brain without Contrast [[id number]] Collected: 12/14/21 2310    Order Status: Completed Updated: 12/15/21 1226    Narrative:      CT BRAIN WITHOUT CONTRAST:  12/14/2021 10:31 PM    INDICATION (as provided by referring clinician): encephalopathy    ADDITIONAL HISTORY: 61 yo w/ hx of MM. IgG kappa (09/07/2010) transferred OSH after recent hypovolemic/distributive shock. Here for acute encephalopathy.    COMPARISON: PET/CT 01/01/2020     TECHNIQUE: Helical CT imaging of the brain without intravenous contrast. Coronal and sagittal reformatted images were obtained.    MEDICATIONS:  None    RADIATION DOSE INDICATORS:  Exposure Events: 2 , CTDIvol Min: 53.9 mGy, CTDIvol Max: 53.9 mGy, DLP: 1024.1 mGy.cm    FINDINGS:     No acute intracranial hemorrhage, large territory vascular infarct, or mass effect. Gray-white differentiation preserved throughout. No hypodensity.     Ventricles within normal limits of size for age.  No extra-axial collection.    No  calvarial or skull base fracture. Bilateral maxillary sinus mucosal thickening including air-fluid levels in the right maxillary sinus, likely reflecting sinus disease. Soft tissue scalp nodules at the vertex and left parietal region, recommend correlation with direct visualization.      Impression:        No acute intracranial hemorrhage, herniation, or hydrocephalus.    Report dictated by: Jason Coop, MD, signed by: Danella Deis Eulas Post, MD  Department of Radiology and Biomedical Imaging    CT Abdomen Jerene Pitch with Contrast [[id number]] Collected: 12/14/21 1841    Order Status: Completed Updated: 12/15/21 1215    Narrative:      CT ABDOMEN/PELVIS WITH CONTRAST  12/14/2021 5:33 PM    CLINICAL HISTORY: FUO    COMPARISON:  Head CT 01/01/2020, ultrasound abdomen 12/11/2021, CT abdomen pelvis 03/10/2018    TECHNIQUE: CT of the abdomen and pelvis was performed.     MEDICATIONS:  Iohexol 350 - 120 mL - Intravenous    FINDINGS:      Visualized lung bases:  For chest findings, please see the separately dictated report from the CT of the chest of the same date.    Liver:  Marked steatosis.    Gallbladder: Unremarkable    Spleen:  Unremarkable    Pancreas:  Unremarkable     Adrenal Glands:  Unremarkable    Kidneys:  Right lower pole parenchymal thinning with adjacent benign cyst.    GI Tract:  Diffuse bowel wall edema and surrounding stranding involving the colon and to a lesser extent the small bowel.     Vasculature:  Unremarkable    Lymphadenopathy: Absent    Peritoneum: Small volume of intraperitoneal free fluid.    Bladder: Foley catheter in a decompressed bladder.    Reproductive organs: Unremarkable    Bones:  Increased sclerosis of bone lesions throughout the thoracolumbar spine, bony pelvis, and proximal left femur. No acute pathologic fracture.    Extraperitoneal soft tissues: Anasarca.    Lines/drains/medical devices: As above      RADIATION DOSE INDICATORS:  Exposure Events: 3 , CTDIvol Min: 15.3 mGy, CTDIvol  Max: 15.3 mGy, DLP: 1009.5 mGy.cm. The following accession numbers are related to this dose report [id number]      Impression:        1.  Findings compatible with enterocolitis of infectious or inflammatory etiology. Involvement of the colon is greater than of the small bowel.    2.  Marked hepatic steatosis.    3.  Increased sclerosis of bone lesions throughout the thoracolumbar spine, bony pelvis, and proximal left femur. Appearance is atypical for multiple myeloma but may represent a combination of treated, partially treated, and active disease. No acute pathologic fracture.          Report dictated by: Cathie Olden Leonidas Romberg, MD, signed by: Lisbeth Renshaw, MD  Department of Radiology and Biomedical Imaging    CT Chest without contrast [[id number]]     Order Status: Canceled     CT Chest without contrast [[id number]]     Order Status: Canceled           Endoscopies:  04/2013: normal per chart review    Assessment/Plan:   Ms. Meraz is a 61 year old female with MM in 2012, currently on daratumumab, pomylast and dex (last 10/2021), PE previously on Eliquis who was admitted [facility] on [facility] with acute on chronic diarrhea, fatigue found to have norovirus c/b hypovolemic shock requiring ICU admission transferred to [facility] for further care fth elevated LFT, encephalopathy and plasma CMV 4440000. GI is consulted for diagnostic endoscopy to evaluate for CMV colitis.     Pt has had chronic watery  diarrhea since 06/2021 with diagnosis of norovirus x2 (09/2021 and 11/2020). ther common ddx for chronic diarrhea includes other infections including C.diff, malabsorption (celiac disease, SIBO, medication), IBD (though less likely given lack of bloody stool, abdominal pain), functional (IBS), microscopic colitis or peptide secreting endocrine tumor. Given immunocompromised state and her highly elevated CMV plasma, it's possible that's her diarrhea is 2/2 CMV colitis which tissue culture and biopsy are needed  for definite diagnosis. We will plan for a flexible sigmoidoscopy tomorrow 02/02.  Recommendations  - follow up C.diff and GI PCR stool study  - Clear liquids for now  - NPO after midnight  - Plan for flexible sigmoidoscopy tomorrow. Please administer tap water enema at 10am.     This case was discussed with service attending Dr. Servando Salina on 12/16/21.    Thank you for involving [place] in the care of Ariel Braun.  We will continue to follow with you. Please don't hesitate to page [phone] with any questions or concerns.    Sharmaine Base   Medical Student  12/16/2021

## 2021-12-16 NOTE — Procedures (Signed)
PROCEDURE NOTE    Patient Name: Ariel Braun  [id number]   02/22/1961                          Diagnoses:  1. Immunocompromised state (CMS code)    2. Diarrhea, unspecified type        Lumbar Puncture    Date/Time: 12/16/2021 5:42 PM  Performed by: Sharlett Iles, MD  Authorized by: Alena Bills, MD     Consent Process:     Emergent situation preventing consent process:  No    Discussion with patient included the following (comment on exceptions):  Diagnosis (the reason for which the procedure is being proposed) and proposed treatment or procedure, Risks, benefits, side effects, likelihood of success of the proposed treatment, anticipated recuperation, and the alternative options and Patient's questions related to the procedure were addressed  Universal Protocol - Time Out Checklist:     Patient ID verified:  Yes    Surgical/procedural site and side verified:  Yes    Site marking verified:  Yes  Pre-procedure details:     Procedure purpose:  Diagnostic    Skin preparation:  2% chlorhexidine    Preparation: Patient was prepped and draped in the usual sterile fashion  Pre-procedure medications (see MAR for exact dosages):     Procedural sedation:  Patient not sedated    Anesthetic used:  Local infiltration    Local anesthetic:  Lidocaine (ordered)  Procedure details:     ultrasound guidance      no CT guidance and no fluoro guidance      Lumbar space:  L3-L4 interspace    Insertion approach: midline    Needle type:  Spinal needle - Quincke tip    Needle length (in):  3.5    Fluid appearance:  Other (comment) (Xanthochromia)    Fluid sent for: see orders for fluid testing    Total volume (ml):  13  Post-procedure:     Puncture site:  Adhesive bandage applied    Patient tolerance of procedure:  Tolerated well, no immediate complications      Sharlett Iles, MD  12/16/2021

## 2021-12-16 NOTE — Consults (Signed)
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 The patient has received VRd X 4 (achieving VGPR) then Mel 200 ASCT 05/12/11. 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 She met the criteria for PD on 02/19/2020 and was started on third line DARA PD in June 2021, which was recently held on 10/20/21 due to chronic diarrhea. Patient was transferred OSH after recent hypovolemic/distributive shock. Here for acute encephalopathy workup and acute liver failure workup.    ASSESSMENT  Patient alert with improved command following sitting EOB, requiring CGA for safety to maintain her sitting balance ~ 15 minutes. Patient wanting to get out of bed today. She required Max A x 2 to perform sit<>stand using the STEDY and Mod-Max A x 2 to stand from flaps of STEDY and sit down to chair. VSS sitting in chair, and patient appeared content. She will benefit from skilled PT and placement for continued therapy to improve her strength, balance and endurance for decreased caregiver burden with functional mobility.    Focus next session: upright tolerance, OOB, therex    RECOMMENDATIONS  DISCHARGE RECOMMENDATION  Comments Placement with ongoing PT needs      Patient Current Functional Status Sufficient for PT Discharge Recommendation Yes   Discharge DME needs TBD pending patient progress.   Discharge transportation needs [facility] Potential Patient participates well in therapy and progressing towards goals     NURSING RECOMMENDATIONS  Inpatient Rehab Assistive Device Recommendation Vertical dependent lift;Ceiling lift   Activity Recommendation OOB to chair with maxi move     SUBJECTIVE  Subjective report:  "I want to sit."  Notable observations:  Supine in bed with HOB elevated at beginning of session. Seated in chair with sling underneath and safety belt closed to front.    SYSTEMS REVIEW  Cognition/Communication  Delirium  screen:  Yes Delirium Screen:   Details:  Encephalopathic    Communication  Cognition/communication impaired:  Yes  [place] Agitation Sedation Scale: (-1) Drowsy, Not fully alert, but has sustained (more than 10 secondsd) awakening, with eye contact, to voice  Arousal comments: (-1) to (0)  Orientation: Disoriented to place, Disoriented to event, Disoriented to time  Communication: Difficulty making basic needs known, Difficulty making complex needs known  Communication comment or intervention : Able to express want to get out bed today  Cognition: Decreased attention, Delayed response, Impaired motor planning, Decreased safety awareness, Decreased insight to deficits, Decreased command following  Cognition comment: Able to follow one step commands ~ 75% of the time       Integumentary  Integumentary deficits:  YesIntegumentary deficit detail:  Jaundiced, bilateral lower extremity pitting edema, wounds at buttocks, foley    Cardiopulmonary  Cardiopulmonary deficits:  Yes  Detail:  Decreased activity tolerance    Musculoskeletal  Musculoskeletal deficits:  Yes  Abnormal strength findings: Hip flexion 2+/5 bilaterally, knee extension 2+/5 bilaterally, ankle DF 2+/5 bilaterally    Neuromuscular  Neuromuscular deficits:  Yes  Motor control or coordination: Impaired at bilateral upper and lower extremities        Pain     Currently in pain: Yes  Pain location: Pain at LLE when attempting hip and knee flexion          COMPREHENSIVE MOVEMENT ANALYSIS/TREATMENT  Precautions/WB status: Yes  Precautions and weight bearing status comments: delrium, falls      Hemodynamic response:  Normal hemodynamic response: Yes  Response:     Comments: Sitting in chair BP 100/55, HR 106 BPM      Functional Mobility  Requires second person/additional health care providers: Yes  Type additional health care provider: OT    Bed mobility Current Initial    Supine < > Sit  Level of assist  Dependent assist;Two person assist  Dependent  assist;Two person assist (12/15/21 0932)   Device   Through long sitting;Bed rail;Head of bed elevated (12/13/21 1430)   Intervention   Verbal cues;Tactile cues;External cues (12/13/21 1430)   Supine<>sit comments:   Dep x 2 pivot to EOB    Transfer Current Initial   Sit < > Stand  Level of assist  Maximal assist;Two person assist  Maximal assist;Two person assist (12/16/21 0930)     Sit < > Stand comments:  max verbal cues for sequencing, Max A x 2 to stand from edge of elevated bed, Mod-Max A x 2 to stand from flaps of STEDY    Balance  Balance deficits noted: Yes  Functional Balance for ADLs  Position Static Dynamic   Sit Static sitting level of assist: Contact guard assist-     Dynamic sitting level of assist: Moderate assist-  Dynamic sitting comment: to scoot EOB     Stand Static Standing level of assist: Maximal assist-   Static standing comment: in STEDY  -        Exercise/other interventions  Supine exercises comment (other exercise, details): ankle pumps AAROM bilaterally, hip and knee flexion AAROM at RLE c/o pain when attempting with the LLE    Communication between other health care providers: Communication between other health care provider: PT;RN  Communication comment:       Education assessment  Learner: Patient  Content: Plan of care  Response: Needs reinforcement  Outcome measures   Physical Therapist Global Assessment of Mobility  Activity Achieved: Active stand with any level of assistance  AMPAC 6-clicks basic mobility score: 9      PLAN  Plan of care status:  Current plan of care remains appropriate  PT frequency:  5x/week  PT duration:  4 weeks.  Comment:  POC expires 2/26      Planned PT interventions:   Specific interventions: Progressive functional mobility training;Gait training;Balance training;NM Re-ed;Aerobic training;Ther ex  Education interventions: Caregiver training;Benefits of activity;Self-pacing/breathing;Exercise program;Mobility with precautions compliance;Fall risk  reduction  Comment:          Leotis Shames, PT, DPT, NCS    12/16/2021

## 2021-12-17 LAB — COMPLETE BLOOD COUNT WITH DIFFERENTIAL
Abs Basophils: 0.01 10*9/L (ref 0.00–0.10)
Abs Eosinophils: 0.01 10*9/L (ref 0.00–0.40)
Abs Imm Granulocytes: 0.03 10*9/L (ref <0.10)
Abs Lymphocytes: 0.2 10*9/L — ABNORMAL LOW (ref 1.00–3.40)
Abs Monocytes: 0.1 10*9/L — ABNORMAL LOW (ref 0.20–0.80)
Abs Neutrophils: 0.87 10*9/L — CL (ref 1.80–6.80)
Hematocrit: 26.5 % — ABNORMAL LOW (ref 36.0–46.0)
Hemoglobin: 9 g/dL — ABNORMAL LOW (ref 12.0–15.5)
MCH: 33.7 pg (ref 26.0–34.0)
MCHC: 34 g/dL (ref 31.0–36.0)
MCV: 99 fL (ref 80–100)
MPV: 13.8 fL — ABNORMAL HIGH (ref 9.1–12.6)
Platelet Count: 71 10*9/L — ABNORMAL LOW (ref 140–450)
RBC Count: 2.67 10*12/L — ABNORMAL LOW (ref 4.00–5.20)
RDW-CV: 23.1 % — ABNORMAL HIGH (ref 11.7–14.4)
WBC Count: 1.2 10*9/L — CL (ref 3.4–10.0)

## 2021-12-17 LAB — CLOSTRIDIUM DIFFICILE
Clostridium difficile: NEGATIVE
Clostridium difficile: NOT DETECTED

## 2021-12-17 LAB — CYTOMEGALOVIRUS DNA, QUANTITATIVE PCR, PLASMA
CMV DNA (IU/mL): 179555 IU/mL — ABNORMAL HIGH (ref <30)
CMV DNA (Log IU/mL): 5.25 — ABNORMAL HIGH (ref <1.48)

## 2021-12-17 LAB — HOLD SPECIMEN REFRIGERATED

## 2021-12-17 LAB — BASIC METABOLIC PANEL (NA, K, CL, CO2, BUN, CR, GLU, CA)
Anion Gap: 5 (ref 4–14)
Calcium, total, Serum / Plasma: 7.5 mg/dL — ABNORMAL LOW (ref 8.4–10.5)
Carbon Dioxide, Total: 22 mmol/L (ref 22–29)
Chloride, Serum / Plasma: 106 mmol/L (ref 101–110)
Creatinine: 0.36 mg/dL — ABNORMAL LOW (ref 0.55–1.02)
Glucose, non-fasting: 73 mg/dL (ref 70–199)
Potassium, Serum / Plasma: 4.2 mmol/L (ref 3.5–5.0)
Sodium, Serum / Plasma: 133 mmol/L — ABNORMAL LOW (ref 135–145)
Urea Nitrogen, serum/plasma: 5 mg/dL — ABNORMAL LOW (ref 7–25)
eGFRcr: 115 mL/min/{1.73_m2} (ref >59)

## 2021-12-17 LAB — PROTHROMBIN TIME
INR: 1.4 — ABNORMAL HIGH (ref 0.9–1.2)
PT: 16.9 s — ABNORMAL HIGH (ref 11.6–15.0)

## 2021-12-17 LAB — GLUCOSE, CSF: Glucose, CSF: 36 mg/dL — ABNORMAL LOW (ref 40–70)

## 2021-12-17 LAB — CELL COUNT AND DIFFERENTIAL, CSF
%Lymphs, CSF: 100 %
%Lymphs, CSF: 88 %
%Mono, Histiocytes, CSF: 6 %
%Neuts, CSF: 6 % — AB
Conc Smear,CSF; # Cells: 10
Conc Smear,CSF; # Cells: 16
RBCs, CSF: 250 x10E6/L — AB
RBCs, CSF: 9 x10E6/L — AB
Tube Number: 1
Tube Number: 4

## 2021-12-17 LAB — COMPREHENSIVE METABOLIC PANEL (BMP, AST, ALT, T.BILI, ALKP, TP ALB)
AST: 76 U/L — ABNORMAL HIGH (ref 5–44)
Alanine transaminase: 79 U/L — ABNORMAL HIGH (ref 10–61)
Albumin, Serum / Plasma: 1.4 g/dL — ABNORMAL LOW (ref 3.4–4.8)
Alkaline Phosphatase: 533 U/L — ABNORMAL HIGH (ref 38–108)
Anion Gap: 6 (ref 4–14)
Bilirubin, Total: 14.1 mg/dL — ABNORMAL HIGH (ref 0.2–1.2)
Calcium, total, Serum / Plasma: 7.3 mg/dL — ABNORMAL LOW (ref 8.4–10.5)
Carbon Dioxide, Total: 22 mmol/L (ref 22–29)
Chloride, Serum / Plasma: 111 mmol/L — ABNORMAL HIGH (ref 101–110)
Creatinine: 0.43 mg/dL — ABNORMAL LOW (ref 0.55–1.02)
Glucose, non-fasting: 79 mg/dL (ref 70–199)
Potassium, Serum / Plasma: 2.9 mmol/L — CL (ref 3.5–5.0)
Protein, Total, Serum / Plasma: 2.9 g/dL — ABNORMAL LOW (ref 6.3–8.6)
Sodium, Serum / Plasma: 139 mmol/L (ref 135–145)
Urea Nitrogen, serum/plasma: 6 mg/dL — ABNORMAL LOW (ref 7–25)
eGFRcr: 111 mL/min/{1.73_m2} (ref >59)

## 2021-12-17 LAB — PROTEIN, TOTAL, CSF: Protein, Total, CSF: 42 mg/dL (ref 20–60)

## 2021-12-17 LAB — MAGNESIUM, SERUM / PLASMA
Magnesium, Serum / Plasma: 1.2 mg/dL — ABNORMAL LOW (ref 1.6–2.6)
Magnesium, Serum / Plasma: 2.7 mg/dL — ABNORMAL HIGH (ref 1.6–2.6)

## 2021-12-17 LAB — PHOSPHORUS, SERUM / PLASMA: Phosphorus, Serum / Plasma: 2.5 mg/dL (ref 2.3–4.7)

## 2021-12-17 LAB — POCT GLUCOSE
Glucose, Glucometer: 84 mg/dL (ref 70–199)
Glucose, Glucometer: 90 mg/dL (ref 70–199)

## 2021-12-17 LAB — FOLATE: Folate, serum: 3.9 ng/mL — ABNORMAL LOW (ref >4)

## 2021-12-17 LAB — RAPID HSV DNA, CSF
Comments: NOT DETECTED
HSV1 DNA: NOT DETECTED
HSV2 DNA: NOT DETECTED

## 2021-12-17 MED ORDER — HYDROMORPHONE (PF) 0.5 MG/0.5 ML INJECTION SYRINGE: 0.5 mg/0.5 mL | INTRAMUSCULAR | Status: DC | PRN

## 2021-12-17 MED ORDER — PHENYLEPHRINE 10 MG/ML INJECTION SOLUTION
10 | INTRAMUSCULAR | Status: DC | PRN
Start: 2021-12-17 — End: 2021-12-17
  Administered 2021-12-17: 200 via INTRAVENOUS
  Administered 2021-12-17: 23:00:00 150 via INTRAVENOUS
  Administered 2021-12-17 (×2): 100 via INTRAVENOUS

## 2021-12-17 MED ORDER — PROPOFOL 10 MG/ML INTRAVENOUS EMULSION
10 | INTRAVENOUS | Status: DC | PRN
Start: 2021-12-17 — End: 2021-12-17
  Administered 2021-12-17: 23:00:00 125 via INTRAVENOUS
  Administered 2021-12-17: 23:00:00 40 via INTRAVENOUS
  Administered 2021-12-17: 23:00:00 150 via INTRAVENOUS

## 2021-12-17 MED ORDER — URSODIOL 300 MG CAPSULE
300 mg | Freq: Two times a day (BID) | ORAL | Status: DC
Start: 2021-12-17 — End: 2021-12-23
  Administered 2021-12-17 – 2021-12-23 (×13): via ORAL

## 2021-12-17 MED ORDER — POTASSIUM CHLORIDE 40 MEQ/100ML IN STERILE WATER INTRAVENOUS PIGGYBACK
40 | Freq: Once | INTRAVENOUS | Status: AC
Start: 2021-12-17 — End: 2021-12-17
  Administered 2021-12-17: 15:00:00 via INTRAVENOUS

## 2021-12-17 MED ORDER — MAGNESIUM SULFATE 2 GRAM/50 ML (4 %) IN WATER INTRAVENOUS PIGGYBACK
2 | Freq: Once | INTRAVENOUS | Status: AC
Start: 2021-12-17 — End: 2021-12-17
  Administered 2021-12-17: 18:00:00 2 g via INTRAVENOUS

## 2021-12-17 MED ORDER — POTASSIUM CHLORIDE 20 MEQ/50 ML IN STERILE WATER INTRAVENOUS PIGGYBACK
20 | Freq: Once | INTRAVENOUS | Status: AC
Start: 2021-12-17 — End: 2021-12-17
  Administered 2021-12-17: 17:00:00 via INTRAVENOUS

## 2021-12-17 MED ORDER — ONDANSETRON HCL (PF) 4 MG/2 ML INJECTION SOLUTION
4 | Freq: Four times a day (QID) | INTRAMUSCULAR | Status: DC | PRN
Start: 2021-12-17 — End: 2021-12-17

## 2021-12-17 MED ORDER — LIDOCAINE (PF) 10 MG/ML (1 %) INJECTION SOLUTION: 10 mg/mL (1 %) | INTRAMUSCULAR | Status: DC

## 2021-12-17 MED ORDER — ONDANSETRON HCL (PF) 4 MG/2 ML INJECTION SOLUTION: 4 mg/2 mL | INTRAMUSCULAR | Status: DC

## 2021-12-17 MED ORDER — SODIUM CHLORIDE 0.9 % INTRAVENOUS SOLUTION: 0.9 % | INTRAVENOUS | Status: DC

## 2021-12-17 MED ORDER — HYDROMORPHONE (PF) 0.5 MG/0.5 ML INJECTION SYRINGE
0.5 | INTRAMUSCULAR | Status: DC | PRN
Start: 2021-12-17 — End: 2021-12-17

## 2021-12-17 MED ORDER — MAGNESIUM SULFATE 2 GRAM/50 ML (4 %) IN WATER INTRAVENOUS PIGGYBACK
2 | Freq: Once | INTRAVENOUS | Status: AC
Start: 2021-12-17 — End: 2021-12-17
  Administered 2021-12-17: 17:00:00 2 g via INTRAVENOUS

## 2021-12-17 MED ORDER — POTASSIUM, SODIUM PHOSPHATES 280 MG-160 MG-250 MG ORAL POWDER PACKET
280-160-250 | Freq: Four times a day (QID) | ORAL | Status: AC
Start: 2021-12-17 — End: 2021-12-17
  Administered 2021-12-17: 20:00:00 via ORAL
  Administered 2021-12-18: 03:00:00 250 via ORAL
  Administered 2021-12-18: 06:00:00 via ORAL

## 2021-12-17 MED ORDER — SODIUM CHLORIDE 0.9 % INTRAVENOUS SOLUTION
0.9 % | INTRAVENOUS | Status: DC
  Administered 2021-12-17: 23:00:00 via INTRAVENOUS

## 2021-12-17 MED ORDER — EPHEDRINE SULFATE 25 MG/5 ML (5 MG/ML) INTRAVENOUS SYRINGE
25 | INTRAVENOUS | Status: DC | PRN
Start: 2021-12-17 — End: 2021-12-17
  Administered 2021-12-17: 10 via INTRAMUSCULAR
  Administered 2021-12-17: 5 via INTRAMUSCULAR

## 2021-12-17 MED ORDER — ONDANSETRON HCL (PF) 4 MG/2 ML INJECTION SOLUTION: 4 mg/2 mL | INTRAMUSCULAR | Status: DC | PRN

## 2021-12-17 MED FILL — POTASSIUM CHLORIDE 40 MEQ/100ML IN STERILE WATER INTRAVENOUS PIGGYBACK: 40 mEq/100 mL | INTRAVENOUS | Qty: 100

## 2021-12-17 MED FILL — DILAUDID (PF) 0.5 MG/0.5 ML INJECTION SYRINGE: 0.5 mg/ mL | INTRAMUSCULAR | Qty: 0.5

## 2021-12-17 MED FILL — MAGNESIUM SULFATE 2 GRAM/50 ML (4 %) IN WATER INTRAVENOUS PIGGYBACK: 2 gram/50 mL (4 %) | INTRAVENOUS | Qty: 50

## 2021-12-17 MED FILL — PHOS-NAK 280 MG-160 MG-250 MG ORAL POWDER PACKET: 280-160-250 mg | ORAL | Qty: 1

## 2021-12-17 MED FILL — LEVOFLOXACIN 500 MG/100 ML IN 5 % DEXTROSE INTRAVENOUS PIGGYBACK: 500 mg/100 mL | INTRAVENOUS | Qty: 100

## 2021-12-17 MED FILL — URSODIOL 300 MG CAPSULE: 300 mg | ORAL | Qty: 1

## 2021-12-17 MED FILL — MICAFUNGIN 100 MG INTRAVENOUS SOLUTION: 100 mg | INTRAVENOUS | Qty: 100

## 2021-12-17 MED FILL — GANCICLOVIR SODIUM 500 MG INTRAVENOUS SOLUTION: 500 mg | INTRAVENOUS | Qty: 8

## 2021-12-17 MED FILL — POTASSIUM CHLORIDE 20 MEQ/50 ML IN STERILE WATER INTRAVENOUS PIGGYBACK: 20 mEq/50 mL | INTRAVENOUS | Qty: 50

## 2021-12-17 NOTE — Plan of Care (Signed)
Pt AVSS thus far into shift w/exception of pain per the checklist of nonverbal pain indicators. Pt given PRN Dilaudid w/good effect. Turning pt q2 hrs. Will CTM.    Problem: Fall, at Risk or Actual - Adult  Goal: Absence of falls and fall related injury  Outcome: Progress within 12 hours  Goal: Knowledge of fall prevention  Outcome: Progress within 12 hours     Problem: Delirium - Adult / Pediatric  Goal: Absence or resolution of delirium  Outcome: Progress within 12 hours

## 2021-12-17 NOTE — Progress Notes (Signed)
[facility] PROGRESS NOTE  ATTENDING ONLY     My date of service is 12/17/21.    24 Hour Course  - GI consult today re: scope  - GI PCR panel ordered  - LP to assess for CMV encephalitis and other causes  - MRI brain pending   - stopped zosyn given cx negative   - Path pending on liver biopsy  - Daughter is at bedside, answered questions    Subjective  Patient is not following commands, is able to communicate slightly more clearly today    Vitals  Temp:  [36.1 C (97 F)-37 C (98.6 F)] 36.1 C (97 F)  Heart Rate:  [69-78] 74  *Resp:  [16-20] 16  BP: (96-108)/(65-76) 108/70  SpO2:  [97 %-99 %] 97 %    MostRecent Weight: 89.8 kg (197 lb 15.6 oz)  Admission Weight: 79.6 kg (175 lb 7.8 oz)      Intake/Output Summary (Last 24 hours) at 12/17/2021 1518  Last data filed at 12/17/2021 1304  Gross per 24 hour   Intake 5090 ml   Output 2750 ml   Net 2340 ml       Pain Score: 0    Physical Exam  Appearance: She is ill-appearing.      Comments: Alert and oriented X 1-2 slow to respond. Confused.    HENT:      Head: Normocephalic and atraumatic.      Nose: Nose normal.   Neck:  R IJ CVC with dressing in place, c/d/i  Eyes:      General: Scleral icterus present.      Extraocular Movements: Extraocular movements intact.      Conjunctiva/sclera: Conjunctivae normal.      Pupils: Pupils are equal, round, and reactive to light.   Cardiovascular:      Rate and Rhythm: Normal rate and regular rhythm.      Pulses: Normal pulses.      Heart sounds: Normal heart sounds. No murmur heard.    No friction rub. No gallop.   Pulmonary:      Effort: Pulmonary effort is normal. No respiratory distress.      Breath sounds: Normal breath sounds. No stridor. No wheezing, rhonchi or rales.   Abdominal:      General: Abdomen is flat. Bowel sounds are normal. There is no distension.      Palpations: Abdomen is soft. There is no mass.      Tenderness: There is no abdominal tenderness. There is no guarding or rebound.      Hernia: No  hernia is present.      Comments: Foley catheter in place, urine appears dark  No suprapubic tenderness.   Musculoskeletal:         General: Normal range of motion.      Cervical back: Normal range of motion and neck supple.      Right lower leg: Edema present.      Left lower leg: Edema present.      Comments: Strength 1/5 in the bilateral lower extremities, unable to raise them to gravity.   Pitting edema 3-4+ in the bilateral lower extremities.   Anasarca in the abdomen and thighs bilaterally.    Skin:     General: Skin is warm.      Capillary Refill: Capillary refill takes less than 2 seconds.      Coloration: Skin is jaundiced.      Findings: Bruising present.      Comments: ecchymoses  on the sternum   Neurological:      Mental Status: She is alert. She is disoriented.      Motor: Weakness present.       Scheduled Meds:   0.9% sodium chloride flush  10-20 mL Intravenous Q12H [place]    0.9% sodium chloride flush  3 mL Intravenous Q12H [place]    dry mouth oral rinse  5 mL Mucous Membrane 4x Daily [place]    ganciclovir (CYTOVENE) IVPB  5 mg/kg (Dosing Weight) Intravenous Q12H [place]    levofloxacin  500 mg Intravenous Daily [place]    lidocaine (PF)  0-0.3 mL Intradermal See Admin Instructions    micafungin  100 mg Intravenous Q24H    ondansetron  4 mg Intravenous Once    potassium/sodium phosphates  1 packet Oral 4x Daily WC [place]    ursodioL  300 mg Oral BID [place]     Continuous Infusions:   sodium chloride      dextrose 5 % and 0.9 % sodium chloride 75 mL/hr (12/17/21 0820)     PRN Meds:   0.9% sodium chloride flush  10-20 mL Intravenous PRN    0.9% sodium chloride flush  3 mL Intravenous PRN    dextrose 50%  12.5 g Intravenous Q15 Min PRN    Or    glucose  20 g Oral Q15 Min PRN    Or    juice (for hypoglycemia)  16 g Oral Q15 Min PRN    HYDROmorphone  0.3-0.8 mg Intravenous Q4H PRN    magnesium sulfate in dextrose 5 %  2-4 g Intravenous Daily PRN    Or    magnesium sulfate in water  2-4 g Intravenous Daily PRN     metoclopramide  5-10 mg Intravenous Q8H PRN    ondansetron  8 mg Intravenous Q8H PRN    polyethylene glycol  17 g Oral Daily PRN    potassium chloride in sterile water  20-80 mEq Intravenous Daily PRN    Or    potassium chloride in sterile water  20-80 mEq Intravenous Daily PRN    Or    potassium chloride  20-80 mEq Oral Daily PRN       Data    CBC        12/17/21  0309   WBC 1.2*   HGB 9.0*   HCT 26.5*   PLT 71*     Coags        12/17/21  0309   INR 1.4*     Chem7        12/17/21  1057 12/17/21  0309   NA 133* 139   K 4.2 2.9*   CL 106 111*   CO2 22 22   BUN 5* 6*   CREAT 0.36* 0.43*   GLU 73 79     Electrolytes        12/17/21  1057 12/17/21  0309   CA 7.5* 7.3*   MG 2.7* 1.2*   PO4  --  2.5     Liver Panel        12/17/21  0309   AST 76*   ALT 79*   ALKP 533*   TBILI 14.1*   TP 2.9*   ALB 1.4*     Lactate  No results found in last 36 hours    Microbiology Results (last 24 hours)     Procedure Component Value Units Date/Time    Central Blood Culture [[id number]] Collected: 12/13/21 0350  Order Status: Completed Specimen: Not Applicable from Central Blood Updated: 12/17/21 0520     Central Blood Culture No growth 4 days.    Central Blood Culture [[id number]] Collected: 12/13/21 0351    Order Status: Completed Specimen: Not Applicable from Central Blood Updated: 12/17/21 0520     Central Blood Culture No growth 4 days.    Rapid HSV DNA, CSF [[id number]] Collected: 12/16/21 1018    Order Status: Completed Specimen: Not Applicable from CSF Updated: 12/16/21 2209     HSV1 DNA Not detected     HSV2 DNA Not detected     Comments --     Reference value for all analytes: Not Detected.  Negative test results do not rule out viral infection.      Cytomegalovirus DNA, Quantitative, Non-plasma samples [[id number]] Collected: 12/16/21 1018    Order Status: Sent Specimen: Not Applicable from CSF Updated: 12/16/21 1822    GI Bacteria Panel PCR [[id number]] Collected: 12/16/21 0945    Order Status: Sent Specimen: Stool in  [place] Updated: 12/16/21 1714    GI Viral Panel PCR [[id number]] Collected: 12/16/21 0945    Order Status: Sent Specimen: Stool in [place] Updated: 12/16/21 1714    GI Parasite Panel PCR [[id number]] Collected: 12/16/21 0945    Order Status: Sent Specimen: Stool in [place] Updated: 12/16/21 1714    Clostridium Difficile [[id number]] Collected: 12/16/21 0945    Order Status: Completed Specimen: Not Applicable from Stool, Loose Updated: 12/16/21 1624     Comments Reference range: Negative test specimen collected 2/1 at 9:45AM PER [facility] R.N. / FM     Clostridium difficile Toxin gene: Not detected      Comment: Negative test    Cytomegalovirus DNA, Quantitative PCR, Plasma [[id number]]  (Abnormal) Collected: 12/16/21 0201    Order Status: Completed Specimen: Not Applicable from Serum Updated: 12/16/21 1617     CMV DNA (IU/mL) 179,555 IU/mL      Comment: *SUBCRITICAL*  Performed by Real-Time PCR  Note new reference range.          CMV DNA (Log IU/mL) 5.25     Comment: Log10 IU/mL  Note new reference range.               Radiology Results  No results found.    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 The patient has received VRd X 4 (achieving VGPR) then Mel 200 ASCT 05/12/11. 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 She met the criteria for PD on 02/19/2020 and was started on third line DARA PD in June 2021, which was recently held on 10/20/21 due to chronic diarrhea. Patient was transferred OSH after recent hypovolemic/distributive shock. Here for acute encephalopathy workup and acute liver failure workup.      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   June 2020 due to progression of M spike Revlimid  was increased to 15 mg PO D1-21 every 28 days, plus dexamethasone  July 2021: started daratumumab, Pomylast 2 mg D 1-21, Q28 days, dexamethasone 12 mg PO Q weekly.   Primary oncologist: Dr. Iona Coach,  Dr. Kennon Rounds [place] [facility]  Receiving dexamethasone 12 mg weekly for  diarrhea  Receiving MT with daratumumab with pomalyst and dex, most recent dose 10/16/21, has since been on hold due to worsening diarrhea.     DATA:   09/07/2010: IgG Kappa MM  12/02/2021: Ig M <5, IgG 447, IgA 20, Ig E <2, SFLC Kappa 16.6, Lambda 1.6, Kappa/Lamda 10.38  12/14/21: IgG K on IFE, IgG 430, KLC 16.6, LLC 2.7, KLR 6.15    Chemo:   Holding Daratumumab and Pomalyst, last dose 10/20/21, given recent infection and acute liver failure.    Supportive Care:   Transfusion: Keep Hg >7, and Platelets >10.   Per daughter, has autoantibodies in blood was a difficult match. Would like to be contacted prior to transfusion.   Line: PICC placed 1/26  Outpatient Oncologist: Dr. Kennon Rounds  Dispo: TBD, after resolution of sepsis and liver failure    # Immunocompromised  #CMV colitis  # At risk for infections due to malignancy  # Shock, likely hypovolemic vs distributive in the setting of chronic diarrhea, + norovirus infection   # Acute on chronic diarrhea, norovirus positive., diff and OP negative.   -No prior evidence of adrenal insufficiency, AM cortisol levels normal, previously on dexamethasone 8 mg PO Qweekly, but has been held since early December. CMV PCR on 1/    Data:  - Ucx: pending  - Bcx: pending  - CT brain: No acute intracranial hemorrhage, herniation, or hydrocephalus.  - CT chest/A/P pending: Medium-sized bilateral pleural effusions with bilateral peribronchovascular groundglass opacities, suggestive of pulmonary edema. Clustered nodules in right middle lobe along the right minor fissure, which could represent mild aspiration or infection. Numerous sclerotic and mixed lytic/sclerotic lesions throughout the thoracic spine and bilateral  ribs, increased in size and number compared to prior. Appearance is atypical for multiple myeloma but may represent a combination of treated, partially treated, and active disease. No acute pathologic fracture. Findings compatible with enterocolitis of infectious or inflammatory etiology. Involvement of the colon is greater than of the small bowel. Marked hepatic steatosis. Increased sclerosis of bone lesions throughout the thoracolumbar spine, bony pelvis, and proximal left femur. Appearance is atypical for multiple myeloma but may represent a combination of treated, partially treated, and active disease. No acute pathologic fracture.  - 1/29:CMV PCR 355,732KGUR  - 1/31: GI PCR panel pending [ ]    2/1: LP results: Neutrophils 6% in tube 1, Lymphocytes 88% (predominant), protein 42 (normal), glucose 36 (low), rapid HSV in CSF negative. CMV CSF PCR pending [ ]   - MRI brain pending [ ]     - 2/2 CMV PCR 179,555High   - 2/2 Flex Sig Biopsies pending [ ]      Plan:  - appreciate ID reccs   - Ganciclovir 5 mg/kg (1/31-)    - GI consult: appreciate reccs: flex sig 2/2    - stop zosyn: s/p IV Zosyn 4.5 g Q8hrs (1/26-1/28, restarted 1/29 given recurrent fever-12/16/21)  - checking weekly CMV, next 2/8 [ ]    - OI ppx: Acyclovir IV    # Grade III neutropenia, at baseline  # Leukopenia  #Thrombocytopenia (possibly 2/2 liver disease)  Data: WBC of 2.2 on 12/05/21, on Zarxio at home  Plan: Continue to monitor    # Acute liver failure of unclear etiology  # Acute on chronic encephalopathy likely 2/2 infectious vs hepatic vs metabolic  # Hyperbilirubinemia  # Anasarca  # Hypoalbuminemia    Data:   -Steady rise in LFTs in 05/2021,  -12/02/21: AST 152--> 121, ALT 130--> 110, TB  5.4, Albumin 1.1  -Hepatitis panel negative.   -1/26:  Tbili 14.6, GGT 1160, ammonia 25  -CTAP 12/02/21: Scattered sclerotic lesions in the lumbar spine, hepatic steatosis.   -RUQ ultrasound: liver enlarged with diffuse increased echogenicity, consistent with  fatty infiltration. 2cm stone in the gallbladder.   -HIDA scan: Normal radiotracer filling of the gallbladder is identified, therefore the exam is not consistent with cystic duct obstruction or acute cholecystitis.  - [place] abd 1/27: Hepatomegaly with diffusely increased echogenicity which can be seen with hepatic amyloidosis or steatosis. Please refer to the recently performed biopsy for further details. Mild perihepatic ascites. Patent hepatic vasculature with normal waveforms. Cholelithiasis without acute cholecystitis. Right pleural effusion.  Portal pressures:  Mean right atrial pressure (mmHg): 4  Mean inferior vena cava pressure (mmHg): 5  Mean free hepatic vein pressure (mmHg): 8   Mean wedged hepatic vein pressure (mmHg): 16   - 1/27 Liver Biopsy: Preliminary results showed extensive ballooning and parenchymal steatosis occupying majority of liver parenchyma. There was no evidence of significant inflammation, cholestasis or infiltration.   - 1/30: Serum studies: Ferritin 2380, iron serum 60, transferrin 46 (low), 93 % sat (high), HEV IgM negative, PCR forVZV negative, HSV negative,CMV+ 443,961High,EBV negative, AMA negative, Hepatitis B core Ab, surface Ab negative, Hep A IgG +/IgM negative   IgG4 pending [ ]      Plan:   - Appreciate Hepatology consult:    Per hepatology: "Based on history, there is no history of heavy alcohol use. While she has some risk factors for NAFLD, the  extent of hepatic steatosis is beyond natural history of NASH. At this juncture, the most likely DDx is secondary causes of rapidly  progressive hepatic steatosis...history of steroid use is the most likely scenario in her case."   - Recommendations:    - Consider avoiding steroid    - Daily LFT/INR    - RD consult [ ]  placed  - final path for Liver bx 1/27 pending [ ]      #AMS  In the setting of liver failure, ammonia only 25. LP performed 2/1 to r/o encephalitis.     Data:   1/31: HIV, TSH, Folate, B12 WNL  1/30: CT  head WNL other than sinus disease  2/1: LP results: Neutrophils 6% in tube 1, Lymphocytes 88% (predominant), protein 42 (normal), glucose 36 (low), rapid HSV in CSF negative. CMV CSF PCR pending [ ]    Brain MRI pending [ ]      Plan:  - given CMV elevation, c/f CMV encephalitis, ID c/s to discuss need for further work up/changes to therapy [ ]    - consider neurology c/s    # History of PE, diagnosed in April 2019  - hold home [place] for now given recent anemia and concerns for bleeding.   - monitor for signs and symptoms of worsening bleeding.     # Elevated TSH at OSH, likely 2/2 subclinical hypothyroidism  -TSH WNL on 12/15/20.      # Anxiety, emotional distress:   -Patient has stressful social situation, intellectually disabled son, and regarding underlying disease process.     #Chronic buttocks pressure wound  -Wound care consult, appreciate recs  -Position changes as tolerated, waffle cushion, etc    #VTE PPx:  Contraindicated: Anticipated procedure    Severity of Illness     During this hospitalization the patient is also being treated for:  - Sepsis (present on admission)    - Malignancy associated fatigue on admission  -  Neoplasm/malignancy associated pain  - Hypoalbuminemia (present on admission)  - Fluid status: Hypovolemia on admission     - Cachexia  - Encephalopathy: Metabolic encephalopathy   --Pressure wound ruled out, treating bilateral buttocks moisture and friction damage     Code Status: FULL    Farrel Conners, MD  12/17/21

## 2021-12-17 NOTE — Consults (Addendum)
Gastroenterology Progress Note     24 Hour Course  - LP obtained with low glucose at 36 but normal protein and no nucleated cells  - Continues gancyclovir  - K 2.9 and Mg 1.2. Undergoing repletion  - 1 BM over the last 24 H. Per nurse, formed.     Subjective  - Pt reports she is doing "okay" this AM without pain or any symptoms. Was able to tell me "[place]" and "January 2023"  - When I mentioned flexible sigmoidoscopy, she became teary and said she did not want to have it done because "it's too much." I explained the risks to her twice but when I asked if she understood why the procedure is needed, she said she did not know. Given her ongoing encephalopathy, I called her daughter and discussed the procedure including benefits vs. Risks and she consented it.     Objective  Vitals  Temp:  [36.1 C (97 F)-37.1 C (98.8 F)] 37 C (98.6 F)  Heart Rate:  [69-85] 69  *Resp:  [17-22] 18  BP: (90-115)/(64-77) 96/66    Intake/Output Summary (Last 24 hours) at 12/17/2021 1007  Last data filed at 12/17/2021 4742  Gross per 24 hour   Intake 5120 ml   Output 2800 ml   Net 2320 ml     General appearance:  Lying in bed in NAD, though somnolent. Opened eyes to voice. Jaundiced  HEENT:  Sclera icteric, dry MM  Cardiopulmonary: breathing comfortably on room air  Abdomen:  Soft, non-distended. Tender to palpation on the RUQ    Extremities: lower extremity edema +2 bilaterally  Neuro:  Opened eyes to voice. Unable to answer questions. Moving extremities spontaneously     Data  Labs: I have personally reviewed and interpreted the following laboratory studies:    Recent Labs     12/17/21  0309 12/16/21  0201 12/15/21  0248   NA 139 142 143   K 2.9* 3.0* 3.1*   CL 111* 112* 116*   CO2 22 22 22    BUN 6* 6* 8   CREAT 0.43* 0.40* 0.43*   GLU 79 83 84   CA 7.3* 7.6* 7.6*   MG 1.2*  --   --      Recent Labs     12/17/21  0309 12/16/21  0201 12/15/21  0248   ALT 79* 89* 83*   AST 76* 86* 77*   ALKP 533* 537* 475*   TBILI 14.1* 13.5* 12.5*      Recent Labs     12/17/21  0309 12/16/21  0201 12/15/21  0248   WBC 1.2* 1.3* 1.0*   HGB 9.0* 9.1* 8.7*   HCT 26.5* 27.5* 26.3*   MCV 99 99 101*   PLT 71* 75* 62*     Recent Labs     12/17/21  0309 12/16/21  0201 12/15/21  0248   INR 1.4* 1.4* 1.5*   PT 16.9* 16.5* 17.0*     No results for input(s): LACTWB in the last 168 hours.    Microbiology:  Microbiology Results (last 24 hours)     Procedure Component Value Units Date/Time    Central Blood Culture [[id number]] Collected: 12/13/21 0350    Order Status: Completed Specimen: Not Applicable from Central Blood Updated: 12/17/21 0520     Central Blood Culture No growth 4 days.    Central Blood Culture [[id number]] Collected: 12/13/21 0351    Order Status: Completed Specimen: Not Applicable from Central Blood Updated: 12/17/21  0520     Central Blood Culture No growth 4 days.    Rapid HSV DNA, CSF [[id number]] Collected: 12/16/21 1018    Order Status: Completed Specimen: Not Applicable from CSF Updated: 12/16/21 2209     HSV1 DNA Not detected     HSV2 DNA Not detected     Comments --     Reference value for all analytes: Not Detected.  Negative test results do not rule out viral infection.      Cytomegalovirus DNA, Quantitative, Non-plasma samples [[id number]] Collected: 12/16/21 1018    Order Status: Sent Specimen: Not Applicable from CSF Updated: 12/16/21 1822    GI Bacteria Panel PCR [[id number]] Collected: 12/16/21 0945    Order Status: Sent Specimen: Stool in [place] Updated: 12/16/21 1714    GI Viral Panel PCR [[id number]] Collected: 12/16/21 0945    Order Status: Sent Specimen: Stool in [place] Updated: 12/16/21 1714    GI Parasite Panel PCR [[id number]] Collected: 12/16/21 0945    Order Status: Sent Specimen: Stool in [place] Updated: 12/16/21 1714    Clostridium Difficile [[id number]] Collected: 12/16/21 0945    Order Status: Completed Specimen: Not Applicable from Stool, Loose Updated: 12/16/21 1624     Comments Reference range: Negative test specimen  collected 2/1 at 9:45AM PER [facility] R.N. / FM     Clostridium difficile Toxin gene: Not detected      Comment: Negative test    Cytomegalovirus DNA, Quantitative PCR, Plasma [[id number]]  (Abnormal) Collected: 12/16/21 0201    Order Status: Completed Specimen: Not Applicable from Serum Updated: 12/16/21 1617     CMV DNA (IU/mL) 179,555 IU/mL      Comment: *SUBCRITICAL*  Performed by Real-Time PCR  Note new reference range.          CMV DNA (Log IU/mL) 5.25     Comment: Log10 IU/mL  Note new reference range.         GI Parasite Panel PCR [[id number]]     Order Status: Completed Specimen: Not Applicable from Stool     GI Viral Panel PCR [[id number]]     Order Status: Completed Specimen: Not Applicable from Stool     GI Bacteria Panel PCR [[id number]]     Order Status: Completed Specimen: Not Applicable from Stool     Hepatitis B e Antibody [[id number]] Collected: 12/13/21 0302    Order Status: Completed Specimen: Blood from Serum Updated: 12/16/21 1345     Hep B e Ab Nonreactive     Comment: (NOTE)  This result should be assessed in conjunction with the patient's  medical history, clinical examination and other hepatitis B  serological markers to determine disease state.     This assay is not specifically validated for patients under 2 years  of age.    Methodology: [place] ID [id number].    Performed by: [place], [place]  [address]         GI Bacteria Panel PCR [[id number]]     Order Status: Canceled Specimen: Not Applicable from Stool     GI Viral Panel PCR [[id number]]     Order Status: Canceled Specimen: Not Applicable from Stool     GI Parasite Panel PCR [[id number]]     Order Status: Canceled Specimen: Not Applicable from Stool           Radiology:   Radiology Results (last  24 hours)     ** No results found for the last 24 hours. **          Assessment/Plan:   Ms. Lagunes is a 61 year old  female with MM in 2012, currently on daratumumab, pomylast and dex (last 10/2021), PE previously on Eliquis who was [facility] with acute on chronic diarrhea, fatigue found to have norovirus c/b hypovolemic shock requiring ICU admission transferred to [facility] for further care fth elevated LFT, encephalopathy and plasma CMV [id number]. GI is consulted for diagnostic endoscopy to evaluate for CMV colitis.     Pt has had chronic watery  diarrhea since 06/2021 with diagnosis of norovirus x2 (09/2021 and 11/2020). ther common ddx for chronic diarrhea includes other infections including C.diff, malabsorption (celiac disease, SIBO, medication), IBD (though less likely given lack of bloody stool, abdominal pain), functional (IBS), microscopic colitis or peptide secreting endocrine tumor. Given immunocompromised state and her highly elevated CMV plasma, it's possible that's her diarrhea is 2/2 CMV colitis which tissue culture and biopsy are needed for definite diagnosis. We will plan for a flexible sigmoidoscopy today 02/02.     Recommendations  - follow up GI PCR stool study  - NPO now  - Correct electrolytes  - Plan for flexible sigmoidoscopy this PM. Please administer tap water enema at 10am.     Thank you for this consult, we will continue to follow with you.  This patient was discussed with attending, Dr. Servando Salina    -----------------------------  Gearlean Alf, PGY-4  Gastroenterology Fellow    Please page the General GI pager highlighted below on weekends and after 5pm on weekdays.     [facility]: [id number]

## 2021-12-17 NOTE — Anesthesia Post-Procedure Evaluation (Signed)
Anesthesia Post-op Evaluation    Scheduled date of Operation: 12/17/2021    Scheduled Surgeon(s):Rishika Chugh, MDAlexis Mae Felicie Morn, MDMai Jacolyn Reedy, MD  Scheduled Procedure(s):ENDO ADULT FLEX SIG WITH BIOPSYENDO ADULT COLONOSCOPY WITH COAGULATION    Final Anesthesia Type: MAC    Assessment  Respiratory Function:      Airway Patency: Excellent      Respiratory Rate: See vitals below      SpO2: See vitals below      Overall Respiratory Assessment: Stable  Cardiovascular Function:      Pulse Rate: See Vitals Below      Blood Pressure: See Vitals Below      Cardiac status: Stable  Mental Status:      RASS Score: 0 Alert or calm  Temperature: Normothermic  Pain Control: Adequate  Nausea and Vomiting: Absent  Fluids/Hydration Status: Euvolemic    Complications (anesthesia/case associated complications, possible complications, and/or significant issues; as of time of note completion: No apparent complications      Plan  Follow-up care: As per primary team    Post-op Note Status: Complete, patient participated in evaluation, which occurred after recovery from anesthesia but prior to 48 hours from end of case                 Recent Pre-op and Post-op Vital Signs  Vitals:    12/17/21 1501 12/17/21 1553 12/17/21 1555   BP: 1_0   Pulse: 74 94 94   Resp: _1 Temp: 36.1 C (97 F)  36 C (96.8 F)   TempSrc: Temporal Temporal Temporal   SpO2: 97% 97% 97%     Last Vital Signs Out of Room to Anesthesia Stop  Vitals Value Taken Time   Pulse     Resp     SpO2     BP     Arterial Line BP (mmHg)      Arterial Line MAP (mmHg)     Arterial Line 2 BP (mmHg)     Arterial Line 2 MAP (mmHg)     Temp     Temp src         Case Tracking Events:  Event Time In   In Facility 12/09/2021 2151   In [place] 12/17/2021 1454   In Pre-op 12/17/2021 1456   Anesthesia Start 12/17/2021 1519   Anesthesia Ready 12/17/2021 1524   In Room 12/17/2021 1519   Procedure Start 12/17/2021 1526   Procedure Finish 12/17/2021 1545   Out of Room 12/17/2021  1550   In PACU 12/17/2021 1552   Anesthesia Finish 12/17/2021 1551

## 2021-12-17 NOTE — H&P (Signed)
Procedure Planned: Flexible Sigmoidoscopy  Diagnosis: diarrhea r/o CMV colitis    Chief Complaint: r/o CMV colitis    Past Medical History:   Diagnosis Date    Acid reflux disease     Anxiety     Depression     Hereditary angioedema (CMS code)     Multiple myeloma, without mention of having achieved remission     IgG kappa    Myeloma (CMS code) 05/10/2011     Past Surgical History:   Procedure Laterality Date    AUTOLOGOUS STEM CELL TRANSPLANTATION  05/12/11    BREAST CYST EXCISION  1981     Allergies/Contraindications   Allergen Reactions    Caffeine Other (See Comments)     jitters    Diphenoxylate-Atropine Other (See Comments)     Severe stomach pain       Medications Prior to Admission   Medication Sig Dispense Refill    acyclovir (ZOVIRAX) 400 mg tablet Take 1 tablet (400 mg total) by mouth Twice a day 180 tablet 3    apixaban (ELIQUIS) 5 mg tablet Take 5 mg by mouth 2 (two) times daily      cholestyramine light (CHOLESTYRAMINE LIGHT) 4 gram packet Take 1 packet (4 g total) by mouth Twice a day 180 packet 3    cholestyramine light 4 gram packet Take 4 g by mouth daily as needed (diarrhea) (Prevalite brand)      dicyclomine (BENTYL) 10 mg capsule Take 10 mg by mouth 3 (three) times daily as needed (stomach pain)      multivit-min-FA-lycopen-lutein (CENTRUM SILVER) 0.4 mg-300 mcg- 250 mcg tablet Take 1 tablet by mouth daily      multivitamin tablet Take 1 tablet by mouth daily      amoxicillin (AMOXIL) 500 mg capsule Take 4 capsules (2,000 mg total) by mouth daily as needed (1 hour prior to dental procedures) (Patient not taking: Reported on 09/22/2021) 4 capsule 3    dexAMETHasone (DECADRON) 4 mg tablet TAKE 2 TABLETS (8 MG TOTAL) BY MOUTH EVERY 7 DAYS. 24 tablet 3    ecallantide 10 mg/mL (1 mL) SOLN Inject under the skin once as needed      EPINEPHrine (EPIPEN) 0.3 mg/0.3 mL (1:1,000) injection Inject 0.3 mg into the muscle once as needed Use as instructed      hydrOXYzine (ATARAX) 25 mg  tablet Take 0.5-1 tablets (12.5-25 mg total) by mouth every 8 (eight) hours as needed for Anxiety 30 tablet 1    icatibant (FIRAZYR) 30 mg/3 mL injection Inject 30 mg under the skin once as needed (angioedema)      LORazepam (ATIVAN) 1 mg tablet Take 1 mg by mouth every 6 (six) hours as needed      metoclopramide HCl (REGLAN) 10 mg tablet Take 10 mg by mouth once as needed      ondansetron (ZOFRAN) 8 mg tablet Take 1 tablet (8 mg total) by mouth every 8 (eight) hours as needed. 30 tablet 2    pomalidomide 2 mg CAP Take 2 mg by mouth daily 21/28 days      predniSONE (DELTASONE) 20 mg tablet Take 20 mg by mouth once as needed         Social History  She      Family History  family history includes Colon cancer in her father; Diabetes in her mother; Hypertension in her brother and mother.   Family history is otherwise negative or as noted above.      Physical Exam:  Vss:  BP 91/55 (BP Location: Right upper arm)   Pulse 94   Temp 36 C (96.8 F) (Temporal)   Resp 16   Ht 153 cm (5' 0.24")   Wt 89.8 kg (197 lb 15.6 oz)   LMP  (LMP Unknown)   SpO2 97%   BMI 38.36 kg/m    Wt Readings from Last 3 Encounters:   12/17/21 89.8 kg (197 lb 15.6 oz)   12/12/18 96.3 kg (212 lb 4.9 oz)   08/08/18 98.9 kg (218 lb 1.6 oz)      Mallampati Score: 2  ASA Classification: 3  General appearance:Lying in bed in NAD, though somnolent. Opened eyes to voice. Jaundiced  HEENT:Sclera icteric, dry MM  Cardiopulmonary: breathing comfortably on room air  Abdomen: Soft, non-distended. Tender to palpation on the RUQ  Extremities:lower extremity edema +2bilaterally  Neuro:Opened eyes to voice. Unable to answer questions. Moving extremities spontaneously    I have personally reviewed the following blood tests listed below:     Latest [place] Labs:  WBC Count   Date Value Ref Range Status   12/17/2021 1.2 (LL) 3.4 - 10.0 x10E9/L Final     Comment:     Panic value not called per policy.  Repeated       RBC Count   Date Value Ref  Range Status   12/17/2021 2.67 (L) 4.00 - 5.20 x10E12/L Final     Hemoglobin   Date Value Ref Range Status   12/17/2021 9.0 (L) 12.0 - 15.5 g/dL Final     Hematocrit   Date Value Ref Range Status   12/17/2021 26.5 (L) 36.0 - 46.0 % Final     MCV   Date Value Ref Range Status   12/17/2021 99 80 - 100 fL Final     MCH   Date Value Ref Range Status   12/17/2021 33.7 26.0 - 34.0 pg Final     MCHC   Date Value Ref Range Status   12/17/2021 34.0 31.0 - 36.0 g/dL Final     Platelet Count   Date Value Ref Range Status   12/17/2021 71 (L) 140 - 450 x10E9/L Final     Alanine transaminase   Date Value Ref Range Status   12/17/2021 79 (H) 10 - 61 U/L Final     AST   Date Value Ref Range Status   12/17/2021 76 (H) 5 - 44 U/L Final     Alkaline Phosphatase   Date Value Ref Range Status   12/17/2021 533 (H) 38 - 108 U/L Final     Bilirubin, Direct   Date Value Ref Range Status   12/10/2021 10.1 (H) <0.6 mg/dL Final     Bilirubin, Total   Date Value Ref Range Status   12/17/2021 14.1 (H) 0.2 - 1.2 mg/dL Final     Gamma-Glutamyl Transpeptidase   Date Value Ref Range Status   12/10/2021 1,160 (H) 8 - 59 U/L Final      No results found for: AMY  No results found for: LIPA  C-Reactive Protein   Date Value Ref Range Status   12/13/2021 11.3 (H) <5.1 mg/L Final     Comment:     This CRP assay is a high sensitivity method that can be used for assessment of cardiovascular risk as well as for assessment of inflammation associated with other clinical conditions.  TERTILE  CRP MG/L   CARDIOVASCULAR RISK  ---------------------------------------     1       <1.0       LOW     2      1.0-3.0     MODERATE     3       >3.0       HIGH  ---------------------------------------            I have reviewed the studies below     Latest imaging and other diagnostics:      Last Colonoscopy:  In 2014   _______________________________  ASSESSMENT AND PLAN: Ms. Vanhoose is a 61 year old female with MMin 2012, currently  on daratumumab, pomylast and dex (last 10/2021), PE previously on Eliquis who was admitted [facility] on [facility]18 with acute on chronic diarrhea, fatigue found to have norovirus c/b hypovolemic shock requiring ICU admission transferred to [facility] for further care fth elevated LFT, encephalopathy and plasma CMV 4440000 presents for flex sig  to evaluate for CMV colitis.     Plan for: Anesthesia Consult    Immediate Pre-Sedation Assessment Completed including response to Pre-Medication.    Airway Status Unchanged - Cleared for Sedation and Procedure    DOCUMENTATION OF INFORMED CONSENT  I have discussed the risks, benefits, and alternatives of the procedure and sedation with the patient and/or the patient's medical decision-maker.  This discussion included, but was not limited to, the risk of bleeding, infection, damage to anatomical structures, need for reoperation, or even death.  The patient and/or the patient's medical decision maker understands, has had all of his/her questions answered, and desires to proceed.  Informed consent obtained.

## 2021-12-17 NOTE — Anesthesia Pre-Procedure Evaluation (Addendum)
[facility]  Anesthesia Preprocedure Evaluation    Procedure Information     Case: [id number] Date: 12/16/21    Procedure: ENDO ADULT FLEX SIG DIAGNOSTIC    Diagnosis: Immunocompromised state (CMS code) [D84.9]    Pre-op diagnosis: Immunocompromised state (CMS code) [D84.9]    Location: [facility]    Surgeons: Viona Gilmore, MD          Precautions          Fall precautions starting at 01/29 1006    Aspiration precautions starting at 01/26 1016    Aspiration precautions starting at 01/25 2319          Relevant Problems   No relevant active problems         Anesthesia Encounter History        CC/HPI/Past Medical History Summary: 61 y.o. woman with history pertinent for multiple myeloma (s/p ASCT 2012) with lytic lesions in the spine (last maintenance daratumumab with pomalyst and dex 10/16/21, held due to worsening diarrhea), h/o PE (02/2018) on eliquis (held for anemia), and hereditary angioedema, who developed cholestasis in 2022, encephalopathy and diarrhea in 11/2021, and has been admitted since 12/02/21, with course notable for hypovolemic shock in the setting of norovirus, rapidly progressive hepatic steatosis (possibly 2/2 steroid use; cannot r/o amyloid), anemia, transferred to [facility] on 12/09/21, liver biopsy/pressure measurements showing portal HTN (HVPG 44mHg), with cholestatic LFTs, small ascites, ongoing encephalopathy (felt unlikely to be hepatic), currently pancytopenic (Hct 26.5, PLT 71K), on ganciclovir for 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 ChuGwynneth Albright 12/17/21.      #HAE history: The patient's daughter reports that the HAE episodes occur 1-2x/year (last in spring/summer) and that they have never been triggered in the healthcare setting (triggers: anxiety/stress).    Per malignant  hematology note (unable to access OSH course in [place] currently), "her hospital course was complicated by ICU admission for hypovolemic shock intermittently requiring pressor support (phenylephrine gtt).  She was found to be pancytopenic with hg of 6.6 and Lactate of 11. Patient had initially refused prbc transfusion. Patient was found to be positive for norovirus. LFTs were elevated consistent with acute liver failure (AST 118, [name], Alp 958, TB 12.1). procal 1.2. MRI of the liver with MRCP was done which showed hepatic steatosis. RUQ ultrasound showed an enlarged liver with large 2cm stone in the gallbladder. HIDA scan was done: findings were not consistent with cystic duct obstruction or acute cholecystitis. The patient was evaluated by GI and advised to follow with hepatology Dr. HamLoman Brooklynr acute liver failure workup, liver biopsy. Patient also has a history of pulmonary embolism on eliquis which was held for progressive anemia. Patient was found to have an appropriate rise in cortisol with ACTH stimulation, patient refusing decadron. Creatinine was wnls."    (Please refer to [facility] Allergies, Problems, Past Medical History, Past Surgical History, Social History, and Family History activities, Results for current data from these respective sections of the chart; these sections of the chart are also summarized in reports, including the Patient Summary Extracts found in Chart Review)      Summary of Outside Records:    TTE (12/03/21): normal biventricular size/systolic function, no significant valvular abnormalities  Summary of Prior Anesthetics: Previous anesthetic without AAC  The patient's daughter reports that the patient has no  personal or family h/o problems with anesthesia. She reports that for a colonoscopy ~10 years ago, the patient took prophylaxis for hereditary angioedema, but that she did not for other anesthetics, most recently the breast excisional biopsy under [facility].        Review of Systems    Constitutional: Positive for activity change.   Respiratory: Negative.    Cardiovascular: Positive for leg swelling.   Gastrointestinal: Negative.        Physical Exam    Airway:   Modified Mallampati score: III. Thyromental distance: 4 cm - 6.5 cm. Mouth opening: fair. Neck range of motion: limited. Cardiovascular:   Cardiovascular exam normal.    Pulmonary:   Pulmonary exam normal    Neurological:   Neurological system normal.      Dental: and in poor condition.         Prepare (Pre-Operative Clinic) Assessment/Plan/Narrative    Stop [place] information    Anesthesia Assessment and Plan  Day Of Surgery Provider Chart Review:  NPO status verified  Medications reviewed  Allergies reviewed  Problem list reviewed  Anesthesia history reviewed  Pertinent labs reviewed  Consults reviewed    ASA 3       Anesthesia Plan  Anesthesia Type: MAC  Induction Technique: IntraVenous  Invasive Monitors/Vascular Access: none  Airway Plan: mask only and supraglottic airway  Other Techniques: None  Planned Recovery Location: Endoscopy Recovery Area    Blood Product PreparationBlood Products Plan: N/A, minimal risk    Anesthesia Potential Complication Discussion  At increased or higher than average risk of: Post-op pulmonary complication, Post-op major adverse cardiac event, Aspiration, Unanticipated hospitalization or ICU level care, Myocardial dysfunction, Allergic reaction and Conversion to GA  There is the possibility of rare but serious complications.    Informed Consent for Anesthesia  Consent obtained from patient    Risks, benefits and alternatives including those of invasive monitoring discussed. Increased risks (as above) discussed.  Questions invited and all answered.  Interpreter: N/A - patient/guardian's preferred language is [place].  Consent granted for anesthetic plan - Daughter [name] [phone]    Quality Measure Documentation   Opioid Therapy Planned? No    (See Anesthesia Record for attending  attestation)    [Please note, smart link data included in this note may not reflect changes since note creation. Please see appropriate section of APeX for up-to-the minute information.]

## 2021-12-18 LAB — POCT GLUCOSE
Glucose, Glucometer: 90 mg/dL (ref 70–199)
Glucose, Glucometer: 87 mg/dL (ref 70–199)

## 2021-12-18 LAB — COMPLETE BLOOD COUNT WITH DIFFERENTIAL
Abs Basophils: 0.01 10*9/L (ref 0.00–0.10)
Abs Eosinophils: 0 10*9/L (ref 0.00–0.40)
Abs Imm Granulocytes: 0.05 10*9/L (ref <0.10)
Abs Lymphocytes: 0.25 10*9/L — ABNORMAL LOW (ref 1.00–3.40)
Abs Monocytes: 0.09 10*9/L — ABNORMAL LOW (ref 0.20–0.80)
Abs Neutrophils: 1.49 10*9/L — ABNORMAL LOW (ref 1.80–6.80)
Hematocrit: 27.6 % — ABNORMAL LOW (ref 36.0–46.0)
Hemoglobin: 9.3 g/dL — ABNORMAL LOW (ref 12.0–15.5)
MCH: 33.6 pg (ref 26.0–34.0)
MCHC: 33.7 g/dL (ref 31.0–36.0)
MCV: 100 fL (ref 80–100)
MPV: 13.9 fL — ABNORMAL HIGH (ref 9.1–12.6)
Platelet Count: 76 10*9/L — ABNORMAL LOW (ref 140–450)
RBC Count: 2.77 10*12/L — ABNORMAL LOW (ref 4.00–5.20)
RDW-CV: 23.2 % — ABNORMAL HIGH (ref 11.7–14.4)
WBC Count: 1.9 10*9/L — ABNORMAL LOW (ref 3.4–10.0)

## 2021-12-18 LAB — COMPREHENSIVE METABOLIC PANEL (BMP, AST, ALT, T.BILI, ALKP, TP ALB)
AST: 70 U/L — ABNORMAL HIGH (ref 5–44)
Alanine transaminase: 72 U/L — ABNORMAL HIGH (ref 10–61)
Albumin, Serum / Plasma: 1.3 g/dL — ABNORMAL LOW (ref 3.4–4.8)
Alkaline Phosphatase: 540 U/L — ABNORMAL HIGH (ref 38–108)
Anion Gap: 6 (ref 4–14)
Bilirubin, Total: 14.8 mg/dL — ABNORMAL HIGH (ref 0.2–1.2)
Calcium, total, Serum / Plasma: 7 mg/dL — ABNORMAL LOW (ref 8.4–10.5)
Carbon Dioxide, Total: 20 mmol/L — ABNORMAL LOW (ref 22–29)
Chloride, Serum / Plasma: 108 mmol/L (ref 101–110)
Creatinine: 0.33 mg/dL — ABNORMAL LOW (ref 0.55–1.02)
Glucose, non-fasting: 84 mg/dL (ref 70–199)
Potassium, Serum / Plasma: 3 mmol/L — ABNORMAL LOW (ref 3.5–5.0)
Protein, Total, Serum / Plasma: 2.9 g/dL — ABNORMAL LOW (ref 6.3–8.6)
Sodium, Serum / Plasma: 134 mmol/L — ABNORMAL LOW (ref 135–145)
Urea Nitrogen, Serum / Plasma: 5 mg/dL — ABNORMAL LOW (ref 7–25)
eGFRcr: 118 mL/min/{1.73_m2} (ref >59)

## 2021-12-18 LAB — CYTOMEGALOVIRUS DNA, QUANTITATIVE, NON-PLASMA SAMPLES: CMV DNA Quant PCR, non plasma: NOT DETECTED

## 2021-12-18 LAB — TYPE AND SCREEN
ABO/RH(D): O POS
Antibody Screen: POSITIVE

## 2021-12-18 LAB — PROTHROMBIN TIME
INR: 1.4 — ABNORMAL HIGH (ref 0.9–1.2)
PT: 16.3 s — ABNORMAL HIGH (ref 11.6–15.0)

## 2021-12-18 MED FILL — DILAUDID (PF) 0.5 MG/0.5 ML INJECTION SYRINGE: 0.5 mg/ mL | INTRAMUSCULAR | Qty: 0.5

## 2021-12-18 MED FILL — PHOS-NAK 280 MG-160 MG-250 MG ORAL POWDER PACKET: 280-160-250 mg | ORAL | Qty: 1

## 2021-12-18 MED FILL — GANCICLOVIR SODIUM 500 MG INTRAVENOUS SOLUTION: 500 mg | INTRAVENOUS | Qty: 8

## 2021-12-18 MED FILL — URSODIOL 300 MG CAPSULE: 300 mg | ORAL | Qty: 1

## 2021-12-18 MED FILL — POTASSIUM CHLORIDE 40 MEQ/100ML IN STERILE WATER INTRAVENOUS PIGGYBACK: 40 mEq/100 mL | INTRAVENOUS | Qty: 100

## 2021-12-18 MED FILL — LEVOFLOXACIN 500 MG/100 ML IN 5 % DEXTROSE INTRAVENOUS PIGGYBACK: 500 mg/100 mL | INTRAVENOUS | Qty: 100

## 2021-12-18 MED FILL — MICAFUNGIN 100 MG INTRAVENOUS SOLUTION: 100 mg | INTRAVENOUS | Qty: 100

## 2021-12-18 NOTE — Consults (Signed)
INFECTIOUS DISEASES FOLLOW UP CONSULT NOTE  ATTENDING ONLY     My date of service is 12/18/2021.    Interim History since previous Infectious Disease note  - LP performed: 0 WBCs, normal protein, glucose low at 36. CMV PCR back negative.   - Flex sig with GI yesterday: edematous friable mucosa seen, awaiting path  - Repeat GI PCRs pending  - MRI pending  - Hepatology reviewed liver biopsy path - reportedly showed steatosis. Official report pending.   - Repeat CMV PCR from early 2/1: 180K    Subjective  Reports doing okay, but eyes fills with tears - not able to articulate source of distress. Denies pain.     Medications  Scheduled Meds:   0.9% sodium chloride flush  10-20 mL Intravenous Q12H [place]    0.9% sodium chloride flush  3 mL Intravenous Q12H [place]    dry mouth oral rinse  5 mL Mucous Membrane 4x Daily [place]    ganciclovir (CYTOVENE) IVPB  5 mg/kg (Dosing Weight) Intravenous Q12H [place]    ursodioL  300 mg Oral BID [place]     Continuous Infusions:   dextrose 5 % and 0.9 % sodium chloride 75 mL/hr (12/17/21 2113)       Antibiotic History  Ganciclovir 1/31-    Prior:  Pip-tazo 1/18-1/22, 1/24-1/27, 1/29-1/31  Micafungin 1/31-2/2  CTX 1/19  Vanc 1/18-1/19    Ppx:  Levofloxacin    High Medication Toxicity Risk: Yes    Vitals  Temp:  [36 C (96.8 F)-37.2 C (99 F)] 36.8 C (98.2 F)  Heart Rate:  [73-98] 76  *Resp:  [13-18] 18  BP: (90-112)/(55-77) 91/64  SpO2:  [94 %-98 %] 98 %      Intake/Output Summary (Last 24 hours) at 12/18/2021 1216  Last data filed at 12/18/2021 1030  Gross per 24 hour   Intake 2241 ml   Output 2250 ml   Net -9 ml       Physical Examination  Gen: Lying in bed in NAD. Jaundiced.  HEENT: Sclera icteric, conjunctiva noninjected. Mucous membranes moist. No oral lesions.   CV: RRR, S1/S2, no m/r/g  Pulm: CTAB. No rhonchi or wheeze.   Abd: +BS. Soft, NTND, no rebound/guarding  Ext: WWP, no c/c/e  Skin: No rash  Neuro: Alert and oriented to person only - says she is in a store when given multiple choices,  does not know year. No focal neuro deficits.     Lines/tubes: LUE PICC    Data    CBC        12/18/21  0434 12/17/21  0309 12/16/21  0201   WBC 1.9* 1.2* 1.3*   HGB 9.3* 9.0* 9.1*   HCT 27.6* 26.5* 27.5*   PLT 76* 71* 75*   NEUTA 1.49* 0.87* 0.94*   LYMA 0.25* 0.20* 0.24*   MOA 0.09* 0.10* 0.13*   EOA 0.00 0.01 0.00   BASOA 0.01 0.01 0.00       Chem7        12/18/21  0434 12/17/21  1057 12/17/21  0309   NA 134* 133* 139   K 3.0* 4.2 2.9*   CL 108 106 111*   CO2 20* 22 22   BUN 5* 5* 6*   CREAT 0.33* 0.36* 0.43*   GLU 84 73 79       Liver Panel        12/18/21  0434 12/17/21  0309 12/16/21  0201 12/11/21  0328 12/10/21  5176  AST 70* 76* 86*   < > 101*   ALT 72* 79* 89*   < > 116*   ALKP 540* 533* 537*   < > 745*   TBILI 14.8* 14.1* 13.5*   < > 14.6*   TP 2.9* 2.9* 3.0*   < > 3.2*   ALB 1.3* 1.4* 1.5*   < > 2.0*   GGT  --   --   --   --  1,160*    < > = values in this interval not displayed.       CRP        12/13/21  0302   CRP 11.3*     Microbiology  Microbiology results - 7 Days   Microbiology Results   Date Collected Specimen Source Result   12/12/2021  3:31 AM Varicella zoster virus DNA Blood, EDTA Tube  Varicella Zoster Dna Not detected   Varicella Zoster Dna Reference range: Not detected   Varicella Zoster Dna This test was developed and its performance characteristics determined by the [facility].  It has not been cleared or approved by the U. S. Food and Drug Administration.     12/12/2021  3:31 AM Rapid HSV DNA, Skin Lesion / Blood Blood, EDTA Tube  Hsv1 Dna Not detected   Hsv2 Dna Not detected   Comments   Reference value for all analytes: Not Detected.  This test is a modification of the FDA authorized assay. Performance characteristics have been determined by the [facility].       12/12/2021  9:06 PM COVID-19 RNA, RT-PCR/Nucleic Acid Amplification Screening (Asymptomatic and No Specific COVID-19 Suspicion); No; No; No; No Procedure Planned; Unknown Bilateral Anterior  Nares +/- OP Swab  Covid-19 Rna (ZOX09) Not detected   Comments (UEA54) See Comment     12/13/2021  3:02 AM Cytomegalovirus DNA, Quantitative PCR, Plasma Serum  Cmv Dna, Quant. Pcr [id number]   Cmv Dna, Quant. Pcr(log Iu/ml) 5.65     12/13/2021  3:02 AM Hepatitis B e Antibody Serum  Hepatitis B E Antibody, Qualitative Nonreactive     12/13/2021  3:50 AM Central Blood Culture Central Blood  Central Blood Culture No growth 5 days.     12/13/2021  3:51 AM Central Blood Culture Central Blood  Central Blood Culture No growth 5 days.     12/13/2021  1:29 PM Urine Culture Urine, Indwelling Catheter Not in Vacutainer  Bacterial Culture, Urine W/o Gram Stain >100,000 Presumptive Candida albicans (Definitive identification performed on previous culture from  12/10/21.)     12/14/2021 11:45 AM Urine Culture Urine, Midstream  Bacterial Culture, Urine W/o Gram Stain >100,000 Candida albicans     12/16/2021  9:45 AM Clostridium Difficile Stool, Loose  Clostridium Difficile Toxin gene: Not detected   Clostridium Difficile Comment: Negative test     12/16/2021  2:01 AM Cytomegalovirus DNA, Quantitative PCR, Plasma Serum  Cmv Dna, Quant. Pcr [id number]   Cmv Dna, Quant. Pcr(log Iu/ml) 5.25     12/16/2021 10:18 AM Rapid HSV DNA, CSF CSF  Hsv1 Dna Not detected   Hsv2 Dna Not detected   Comments (4)   Reference value for all analytes: Not Detected.  Negative test results do not rule out viral infection.       12/16/2021 10:18 AM Cytomegalovirus DNA, Quantitative, Non-plasma samples CSF  Cmv Dna Quant Pcr, Non Plasma Not detected   Cmv Dna Quant Pcr, Non Plasma   (NOTE)  Reference range: Not detected    CMV copy  number is determined by real time PCR amplification of total   plasma DNA using primers to a segment of the UL54 DNA polymerase   gene. A decision to treat CMV infection should take into account the   clinical presentation as well as an increasing level of CMV DNA and   not just the absolute level of the CMV DNA.    For questions  regarding interpretation of results, contact Infectious   Disease service.    Linear range: 1.37x10e2 to 9.10x10e6 IU/mL    This test is a modification of the FDA authorized assay. Performance   characteristics have been determined by the [facility].       12/16/2021  9:45 AM GI Bacteria Panel PCR Stool in [facility]  Comments Repeat testing in progress.     12/16/2021  9:45 AM GI Viral Panel PCR Stool in [place] Repeat testing in progress.     12/16/2021  9:45 AM GI Parasite Panel PCR Stool in [employer]  [facility] Repeat testing in progress.       OSH  1/18 GI PCR: +norovirus  1/18: flu, RSV, covid neg  1/22: CMV PCR positive  1/22: EBV PCR neg  1/24: blood cx x2: neg  1/25: VZV PCR: neg    Radiology Results  No results found.    Assessment and Recommendations  Ariel Braun is a 61yo woman with MM s/p mel-auto in 2012, most recently on dara+pom+dex (last 10/2021), PE previously on Eliquis, admitted to [facility] on 1/18 with weakness and diarrhea and found to have norovirus, course c/b hypovolemic shock requiring ICU admission, transferred to [facility] for further management and found to have plasma CMV 444,000 with LFT abnormalities, ongoing diarrhea and colitis, pancytopenia, and encephalopathy. CMV with colitis, hepatitis, and possibly encephalitis would be a potential unifying diagnosis for all of these abnormalities - however, LP is bland (hard to interpret hypoglycorrhachia with no WBCs in non-neutropenic patient and normal protein) with negative CMV PCR, and at least prelim liver biopsy path did not suggest viral hepatitis. Awaiting results from flex sig with GI.     Recommendations  - Follow up flex sig path results  - MRI brain w/wo contrast pending  - Follow up repeat GI PCRs  - Follow up pending liver biopsy results  - Continue IV ganciclovir 5 mg/kg q12    Total time spent in encounter: 50 minutes    We will continue to follow with you.  Please contact [facility]  Call via Voalte with questions.    Tonette Bihari, MD  12/18/2021

## 2021-12-18 NOTE — Consults (Signed)
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 The patient has received VRd X 4 (achieving VGPR) then Mel 200 ASCT 05/12/11. 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 She met the criteria for PD on 02/19/2020 and was started on third line DARA PD in June 2021, which was recently held on 10/20/21 due to chronic diarrhea. Patient was transferred OSH after recent hypovolemic/distributive shock. Here for acute encephalopathy workup and acute liver failure workup.    Assessment and Treatment Plan    OT Progress summary: Pt continues to request transfer to chair and agreeable with max cueing to maintain arousal. Pt limited by encephalopathy, weakness, impaired motor planning and globally impaired. Pt tolerated maximove transfer to chair however unable to engage in feeding or grooming seated. Pt required dependent assist with ADLs. Recommend placement.  Progress with OT: Slow progress  Current maximal level of assist: Dependent assist  Next session focus:      Recommendations    Recommended Discharge Disposition Placement for continued therapy   Discharge DME Recommendations Defer to next level of care;To be determined   Equipment Recommendations     DME Comment     Discharge Recommendations Comment     Rehab Potential Patient participates well in therapy and is progressing towards goal   Anticipated Assistance Available at Discharge Family;Spouse/Significant other   Anticipated Type of Assistance Available at Discharge Assist as needed   Anticipated Time of Assistance Available at Discharge     Barriers to Discharge Medical issues;Impaired Cognition;Insufficient activity tolerance   Patient's current functional ability appropriate for D/C recommendations No     Inpatient Recommendations  OT Inpatient Recommendations     Recommendation Comments Sling to chair w/ maximove and 2p assist    Current Maximal Level of Assist Needed Dependent assist     Brace/Precautions   Brace/Orthotic/Prosthetic:No  Precautions:Has weight bearing limitation or precaution: Yes  Precautions and weight bearing status comments: delrium, falls     Subjective Report:"Pleeassssseee"    Patient/Family Goal:     Objective Findings and Interventions    Areas of Occupation    Grooming and Light Hygiene Maximal assistance   Cueing Required     Comment comb hair and wash face   Self Feeding Moderate assistance   Cueing Required     Comment two spoonfuls of cream of wheat increased time for swallow, food residue used yankeur   Toileting Maximal assistance   Cueing Required     Toileting Clothing Management Maximal assistance   Cueing Required     Comment incontinent of stool max A pericare   Upper Body Dressing     Cueing Required     Comment     Lower Body Dressing     Cueing Required     Comment     Upper Body Bathing     Cueing Required     Comment     Lower Body Bathing     Cueing Required     Comment     ADL/IADL Comment         Functional Transfers    Bed Mobility From Supine   Bed Mobility To Sidelying   Level of Assist Maximal assistance   Cueing Required     Transfer From Bed   Transfer To Chair   Level of Assist Dependent   Cueing Required  Technique     Functional Transfer Comment Maximove transfer to chair w/ 2p assist. Tolerated 45 min in chair, max A grooming and feeding in chair.     Functional Balance for ADLs  Position Static Dynamic   Sit  -      -        Stand  -      -            [employer] AM-PAC for ADLs  6 Click Score: 11    Client Factors     Cognition comment:                                                    OT Education  Psychologist, educational: Patient  Content: Plan of care  Response: Needs reinforcement  Communication between other health care provider: OT;RN  Communication comment: patient's functional status    OT Plan of [place], OT  12/18/2021 12:28 PM

## 2021-12-18 NOTE — Consults (Signed)
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 The patient has received VRd X 4 (achieving VGPR) then Mel 200 ASCT 05/12/11. 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 She met the criteria for PD on 02/19/2020 and was started on third line DARA PD in June 2021, which was recently held on 10/20/21 due to chronic diarrhea. Patient was transferred OSH after recent hypovolemic/distributive shock. Here for acute encephalopathy workup and acute liver failure workup.    Medical Update:        ASSESSMENT  Pt uncomfortable with rolling and peri care, moaning throughout. Dependently lifted pt to chair, with initial lift pt requesting to lie back down but when asked if she would like to get to the chair pt confirmed yes so continued dependent transfer to chair with maxi move sling transfer. Pt seated on waffle cushion and appeared more comfortable in chair with decreased moaning. Pt continues to be encephalopathic, unable to state where she is, states she doesn't know where she lives, but is able to identify that her daughter's name is [name]. Pt left seated in chair with OT. Will continue to follow per PT POC to maximize mobility, strength, endurance, and balance. Rec placement.    Focus next session: upright tolerance, OOB, therex    RECOMMENDATIONS  DISCHARGE RECOMMENDATION  Comments Placement with ongoing PT needs  Pending medical stability.   Patient Current Functional Status Sufficient for PT Discharge Recommendation Yes   Discharge DME needs TBD pending patient progress.   Discharge transportation needs [facility] Potential Patient participates well in therapy and progressing towards goals     NURSING RECOMMENDATIONS  Inpatient Rehab Assistive Device Recommendation Vertical dependent lift;Ceiling lift   Activity Recommendation OOB to chair with maxi move     SUBJECTIVE  Subjective report:  OT- do you want  to sit in the chair? Pt - "Yes"  Notable observations:  Supine in bed with HOB elevated at beginning of session. Seated in chair with sling underneath and safety belt closed to front, OT at bedside    SYSTEMS REVIEW  Cognition/Communication  Delirium screen:  Yes Delirium Screen:   Details:  Encephalopathic    Communication  Cognition/communication impaired:  Yes  [place] Agitation Sedation Scale: (-1) Drowsy, Not fully alert, but has sustained (more than 10 secondsd) awakening, with eye contact, to voice  Arousal comments: (-1) to (0)  Orientation: Disoriented to place, Disoriented to event, Disoriented to time  Communication: Difficulty making basic needs known, Difficulty making complex needs known, Expressive deficits  Communication comment or intervention : primarily moaning, "I want to lie down" but then when asked if she wants to get to chair states "yes" after a delay  Cognition: Decreased attention, Delayed response, Impaired motor planning, Decreased safety awareness, Decreased insight to deficits, Decreased command following  Cognition comment: Able to follow one step commands ~ 50% of the time  Behavior: Anxious  Behavior comment: Anxious with movement.    Integumentary  Integumentary deficits:  YesIntegumentary deficit detail:  Jaundiced, bilateral lower extremity pitting edema, wounds at buttocks, foley    Cardiopulmonary  Cardiopulmonary deficits:  Yes  Detail:  Decreased activity tolerance    Musculoskeletal  Musculoskeletal deficits:  Yes  Abnormal strength findings: Hip flexion 2+/5 bilaterally, knee extension 2+/5 bilaterally, ankle DF 2+/5 bilaterally    Neuromuscular  Neuromuscular deficits:  Yes  Motor control  or coordination: Impaired at bilateral upper and lower extremities        Pain     Currently in pain: Yes  Pain location: peri area with cleaning, globally with mobility  Pain scale: Wong-Baker     Pain Level Upon Arrival: No hurt  Highest Pain Level During Therapy: Hurts whole lot  Pain  Level End of Therapy: Hurts little more    COMPREHENSIVE MOVEMENT ANALYSIS/TREATMENT  Precautions/WB status: Yes  Precautions and weight bearing status comments: delrium, falls      Hemodynamic response:   Normal hemodynamic response: Yes  Response:     Comments:        Functional Mobility  Requires second person/additional health care providers: Yes  Type additional health care provider: OT    Bed mobility Current Initial    Rolling  Level of assist  Maximal assist  Maximal assist (12/15/21 0932)   Device  Log roll  Log roll (12/15/21 0932)   Intervention     Rolling comments:  Rolling L and R to perform peri-care after BM incontinence    Transfer Current Initial   Sit < > Stand  Level of assist   Maximal assist;Two person assist (12/16/21 0930)   Device     Intervention     Transfer  Level of assist  From: Bed  To: Chair  Dependent assist  Dependent assist (12/18/21 4332)   Device  Vertical dependent lift  Vertical dependent lift (12/18/21 9518)   Intervention  Verbal cues;Tactile cues  Verbal cues;Tactile cues (12/18/21 0938)   Sit < > Stand comments:   Transfer comments:   supine > chair via maximove/dependent lift    Balance  Balance deficits noted: Yes  Functional Balance for ADLs  Position Static Dynamic   Sit  -      -        Stand  -      -          Communication between other health care providers: Communication between other health care provider: OT;RN  Communication comment: patient's functional status     Education assessment  Learner: Patient  Content: Plan of care  Response: Needs reinforcement  Outcome measures   Physical Therapist Global Assessment of Mobility  Activity Achieved: Active transfer to chair/commode w any level of assistance  AMPAC 6-clicks basic mobility score: 10      PLAN  Plan of care status:  Current plan of care remains appropriate  PT frequency:  5x/week  PT duration:  4 weeks.  Comment:  POC expires 2/26      Planned PT interventions:   Specific interventions: Progressive  functional mobility training;Gait training;Balance training;NM Re-ed;Aerobic training;Ther ex  Education interventions: Caregiver training;Benefits of activity;Self-pacing/breathing;Exercise program;Mobility with precautions compliance;Fall risk reduction  Comment:          Brien Mates, PT    12/18/2021

## 2021-12-18 NOTE — Progress Notes (Signed)
[facility] PROGRESS NOTE  ATTENDING ONLY     My date of service is 12/18/21.    24 Hour Course  - GI PCR panel ordered  - LP to assess for CMV encephalitis and other causes  - MRI brain pending   - Path pending on liver biopsy    Subjective  Patient is not following commands    Vitals  Temp:  [36.3 C (97.3 F)-37.1 C (98.8 F)] 36.6 C (97.9 F)  Heart Rate:  [73-83] 83  *Resp:  [15-18] 18  BP: (91-110)/(64-77) 106/71  SpO2:  [94 %-98 %] 97 %    MostRecent Weight: 89.8 kg (197 lb 15.6 oz)  Admission Weight: 79.6 kg (175 lb 7.8 oz)      Intake/Output Summary (Last 24 hours) at 12/18/2021 1700  Last data filed at 12/18/2021 1619  Gross per 24 hour   Intake 3054 ml   Output 2050 ml   Net 1004 ml       Pain Score: Eyes closed, patient calm    Physical Exam  Appearance: She is ill-appearing.      Comments: Alert and oriented X 1-2 slow to respond. Confused.    HENT:      Head: Normocephalic and atraumatic.      Nose: Nose normal.   Neck:  R IJ CVC with dressing in place, c/d/i  Eyes:      General: Scleral icterus present.      Extraocular Movements: Extraocular movements intact.      Conjunctiva/sclera: Conjunctivae normal.      Pupils: Pupils are equal, round, and reactive to light.   Cardiovascular:      Rate and Rhythm: Normal rate and regular rhythm.      Pulses: Normal pulses.      Heart sounds: Normal heart sounds. No murmur heard.    No friction rub. No gallop.   Pulmonary:      Effort: Pulmonary effort is normal. No respiratory distress.      Breath sounds: Normal breath sounds. No stridor. No wheezing, rhonchi or rales.   Abdominal:      General: Abdomen is flat. Bowel sounds are normal. There is no distension.      Palpations: Abdomen is soft. There is no mass.      Tenderness: There is no abdominal tenderness. There is no guarding or rebound.      Hernia: No hernia is present.      Comments: Foley catheter in place, urine appears dark  No suprapubic tenderness.   Musculoskeletal:          General: Normal range of motion.      Cervical back: Normal range of motion and neck supple.      Right lower leg: Edema present.      Left lower leg: Edema present.      Comments: Strength 1/5 in the bilateral lower extremities, unable to raise them to gravity.   Pitting edema 3-4+ in the bilateral lower extremities.   Anasarca in the abdomen and thighs bilaterally.    Skin:     General: Skin is warm.      Capillary Refill: Capillary refill takes less than 2 seconds.      Coloration: Skin is jaundiced.      Findings: Bruising present.      Comments: ecchymoses on the sternum   Neurological:      Mental Status: She is alert. She is disoriented.      Motor: Weakness present.  Scheduled Meds:   0.9% sodium chloride flush  10-20 mL Intravenous Q12H [place]    0.9% sodium chloride flush  3 mL Intravenous Q12H [place]    dry mouth oral rinse  5 mL Mucous Membrane 4x Daily [place]    ganciclovir (CYTOVENE) IVPB  5 mg/kg (Dosing Weight) Intravenous Q12H [place]    ursodioL  300 mg Oral BID [place]     Continuous Infusions:   dextrose 5 % and 0.9 % sodium chloride 75 mL/hr (12/17/21 2113)     PRN Meds:   0.9% sodium chloride flush  10-20 mL Intravenous PRN    0.9% sodium chloride flush  3 mL Intravenous PRN    dextrose 50%  12.5 g Intravenous Q15 Min PRN    Or    glucose  20 g Oral Q15 Min PRN    Or    juice (for hypoglycemia)  16 g Oral Q15 Min PRN    HYDROmorphone  0.3-0.8 mg Intravenous Q4H PRN    magnesium sulfate in dextrose 5 %  2-4 g Intravenous Daily PRN    Or    magnesium sulfate in water  2-4 g Intravenous Daily PRN    metoclopramide  5-10 mg Intravenous Q8H PRN    ondansetron  8 mg Intravenous Q8H PRN    polyethylene glycol  17 g Oral Daily PRN    potassium chloride in sterile water  20-80 mEq Intravenous Daily PRN    Or    potassium chloride in sterile water  20-80 mEq Intravenous Daily PRN    Or    potassium chloride  20-80 mEq Oral Daily PRN       Data    CBC        12/18/21  0434   WBC 1.9*   HGB 9.3*    HCT 27.6*   PLT 76*     Coags        12/18/21  0434   INR 1.4*     Chem7        12/18/21  0434 12/17/21  1057   NA 134* 133*   K 3.0* 4.2   CL 108 106   CO2 20* 22   BUN 5* 5*   CREAT 0.33* 0.36*   GLU 84 73     Electrolytes        12/18/21  0434 12/17/21  1057   CA 7.0* 7.5*   MG  --  2.7*     Liver Panel        12/18/21  0434   AST 70*   ALT 72*   ALKP 540*   TBILI 14.8*   TP 2.9*   ALB 1.3*     Lactate  No results found in last 36 hours    Microbiology Results (last 24 hours)     Procedure Component Value Units Date/Time    GI Parasite Panel PCR [[id number]] Collected: 12/16/21 0945    Order Status: Completed Specimen: Stool in [place] Updated: 12/18/21 1544     Comments ADD ON TEST     Comments Repeat testing in progress.    GI Viral Panel PCR [[id number]] Collected: 12/16/21 0945    Order Status: Completed Specimen: Stool in [place] Updated: 12/18/21 1544     Comments ADD ON TEST     Comments Repeat testing in progress.    GI Bacteria Panel PCR [[id number]] Collected: 12/16/21 0945    Order Status: Completed Specimen: Stool in [place] Updated: 12/18/21 1544  Comments ADD ON TEST     Comments Repeat testing in progress.    Cytomegalovirus DNA, Quantitative, Non-plasma samples [[id number]] Collected: 12/16/21 1018    Order Status: Completed Specimen: Not Applicable from CSF Updated: 12/18/21 1335     CMV DNA Quant PCR, non plasma Not detected     CMV DNA Quant PCR, non plasma --     (NOTE)  Reference range: Not detected    CMV copy number is determined by real time PCR amplification of total   plasma DNA using primers to a segment of the UL54 DNA polymerase   gene. A decision to treat CMV infection should take into account the   clinical presentation as well as an increasing level of CMV DNA and   not just the absolute level of the CMV DNA.    For questions regarding interpretation of results, contact Infectious   Disease service.    Linear range: 1.37x10e2 to 9.10x10e6 IU/mL    This test is a  modification of the FDA authorized assay. Performance   characteristics have been determined by the [facility].      Central Blood Culture [[id number]] Collected: 12/13/21 0350    Order Status: Completed Specimen: Not Applicable from Central Blood Updated: 12/18/21 0035     Central Blood Culture No growth 5 days.    Central Blood Culture [[id number]] Collected: 12/13/21 0351    Order Status: Completed Specimen: Not Applicable from Central Blood Updated: 12/18/21 0035     Central Blood Culture No growth 5 days.          Radiology Results  No results found.    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 The patient has received VRd X 4 (achieving VGPR) then Mel 200 ASCT 05/12/11. 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 She met the criteria for PD on 02/19/2020 and was started on third line DARA PD in June 2021, which was recently held on 10/20/21 due to chronic diarrhea. Patient was transferred OSH after recent hypovolemic/distributive shock. Here for acute encephalopathy workup and acute liver failure workup.      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   June 2020 due to progression of M spike Revlimid was increased to 15 mg PO D1-21 every 28 days, plus dexamethasone  July 2021: started daratumumab, Pomylast 2 mg D 1-21, Q28 days, dexamethasone 12 mg PO Q weekly.   Primary oncologist: Dr. Iona Coach,  Dr. Kennon Rounds [facility]  Receiving dexamethasone 12 mg weekly for diarrhea  Receiving MT with daratumumab with pomalyst and dex, most recent dose 10/16/21, has since been on hold due to worsening diarrhea.     DATA:   09/07/2010: IgG Kappa MM  12/02/2021: Ig M <5, IgG 447, IgA 20, Ig E <2, SFLC Kappa  16.6, Lambda 1.6, Kappa/Lamda 10.38  12/14/21: IgG K on IFE, IgG 430, KLC 16.6, LLC 2.7, KLR 6.15    Chemo:   Holding Daratumumab and Pomalyst, last dose 10/20/21, given recent infection and acute liver failure.    Supportive Care:   Transfusion: Keep Hg >7, and Platelets >10.   Per daughter, has autoantibodies in blood was a difficult  match. Would like to be contacted prior to transfusion.   Line: PICC placed 1/26  Outpatient Oncologist: Dr. Kennon Rounds  Dispo: TBD, after resolution of sepsis and liver failure    # Immunocompromised  #CMV colitis  # At risk for infections due to malignancy  # Shock, likely hypovolemic vs distributive in the setting of chronic diarrhea, + norovirus infection   # Acute on chronic diarrhea, norovirus positive., diff and OP negative.   -No prior evidence of adrenal insufficiency, AM cortisol levels normal, previously on dexamethasone 8 mg PO Qweekly, but has been held since early December. CMV PCR on 1/    Data:  - Ucx: pending  - Bcx: pending  - CT brain: No acute intracranial hemorrhage, herniation, or hydrocephalus.  - CT chest/A/P pending: Medium-sized bilateral pleural effusions with bilateral peribronchovascular groundglass opacities, suggestive of pulmonary edema. Clustered nodules in right middle lobe along the right minor fissure, which could represent mild aspiration or infection. Numerous sclerotic and mixed lytic/sclerotic lesions throughout the thoracic spine and bilateral ribs, increased in size and number compared to prior. Appearance is atypical for multiple myeloma but may represent a combination of treated, partially treated, and active disease. No acute pathologic fracture. Findings compatible with enterocolitis of infectious or inflammatory etiology. Involvement of the colon is greater than of the small bowel. Marked hepatic steatosis. Increased sclerosis of bone lesions throughout the thoracolumbar spine, bony pelvis, and proximal left femur. Appearance is  atypical for multiple myeloma but may represent a combination of treated, partially treated, and active disease. No acute pathologic fracture.  - 1/29:CMV PCR [id number]  - 1/31: GI PCR panel pending [ ]    2/1: LP results: Neutrophils 6% in tube 1, Lymphocytes 88% (predominant), protein 42 (normal), glucose 36 (low), rapid HSV in CSF negative. CMV CSF PCR pending [ ]   - MRI brain pending [ ]     - 2/2 CMV PCR 179,555High   - 2/2 Flex Sig Biopsies pending [ ]      Plan:  - appreciate ID reccs   - Ganciclovir 5 mg/kg (1/31-)    - GI consult: appreciate reccs: flex sig 2/2    - stop zosyn: s/p IV Zosyn 4.5 g Q8hrs (1/26-1/28, restarted 1/29 given recurrent fever-12/16/21)  - checking weekly CMV, next 2/8 [ ]    - OI ppx: Acyclovir IV    # Grade III neutropenia, at baseline  # Leukopenia  #Thrombocytopenia (possibly 2/2 liver disease)  Data: WBC of 2.2 on 12/05/21, on Zarxio at home  Plan: Continue to monitor    # Acute liver failure of unclear etiology  # Acute on chronic encephalopathy likely 2/2 infectious vs hepatic vs metabolic  # Hyperbilirubinemia  # Anasarca  # Hypoalbuminemia    Data:   -Steady rise in LFTs in 05/2021,  -12/02/21: AST 152--> 121, ALT 130--> 110, TB 5.4, Albumin 1.1  -Hepatitis panel negative.   -1/26:  Tbili 14.6, GGT 1160, ammonia 25  -CTAP 12/02/21: Scattered sclerotic lesions in the lumbar spine, hepatic steatosis.   -RUQ ultrasound: liver enlarged with diffuse increased echogenicity, consistent with fatty infiltration. 2cm stone in the gallbladder.   -HIDA scan: Normal radiotracer filling of the gallbladder is identified, therefore the exam is not consistent with cystic duct obstruction or acute cholecystitis.  - [place] abd 1/27: Hepatomegaly with diffusely increased echogenicity which can be seen with hepatic amyloidosis or steatosis. Please refer to the recently performed biopsy for further details. Mild perihepatic ascites. Patent hepatic vasculature with normal waveforms. Cholelithiasis  without acute cholecystitis. Right pleural effusion.  Portal pressures:  Mean right atrial pressure (mmHg): 4  Mean inferior vena cava pressure (mmHg): 5  Mean free hepatic vein pressure (mmHg): 8   Mean wedged hepatic vein pressure (mmHg): 16   - 1/27 Liver Biopsy: Preliminary results showed extensive ballooning and parenchymal steatosis occupying majority of liver parenchyma. There was no evidence of significant inflammation, cholestasis or infiltration.   - 1/30: Serum studies: Ferritin 2380, iron serum 60, transferrin 46 (low), 93 % sat (high), HEV IgM negative, PCR forVZV negative, HSV negative,CMV+ 443,961High,EBV negative, AMA negative, Hepatitis B core Ab, surface Ab negative, Hep A IgG +/IgM negative   IgG4 pending [ ]      Plan:   - Appreciate Hepatology consult:    Per hepatology: "Based on history, there is no history of heavy alcohol use. While she has some risk factors for NAFLD, the  extent of hepatic steatosis is beyond natural history of NASH. At this juncture, the most likely DDx is secondary causes of rapidly  progressive hepatic steatosis...history of steroid use is the most likely scenario in her case."   - Recommendations:    - Consider avoiding steroid    - Daily LFT/INR    - RD consult [ ]  placed  - final path for Liver bx 1/27 pending [ ]      #AMS  In the setting of liver failure, ammonia only 25. LP performed 2/1 to r/o encephalitis.     Data:   1/31: HIV, TSH, Folate, B12 WNL  1/30: CT head WNL other than sinus disease  2/1: LP results: Neutrophils 6% in tube 1, Lymphocytes 88% (predominant), protein 42 (normal), glucose 36 (low), rapid HSV in CSF negative. CMV CSF PCR pending [ ]    Brain MRI pending [ ]      Plan:  - given CMV elevation, c/f CMV encephalitis, ID c/s to discuss need for further work up/changes to therapy [ ]    - consider neurology c/s    # History of PE, diagnosed in April 2019  - hold home [place] for now given recent anemia and concerns for bleeding.   -  monitor for signs and symptoms of worsening bleeding.     # Elevated TSH at OSH, likely 2/2 subclinical hypothyroidism  -TSH WNL on 12/15/20.      # Anxiety, emotional distress:   -Patient has stressful social situation, intellectually disabled son, and regarding underlying disease process.     #Chronic buttocks pressure wound  -Wound care consult, appreciate recs  -Position changes as tolerated, waffle cushion, etc    #VTE PPx:  Contraindicated: Anticipated procedure    Severity of Illness     During this hospitalization the patient is also being treated for:  - Sepsis (present on admission)    - Malignancy associated fatigue on admission  - Neoplasm/malignancy associated pain  - Hypoalbuminemia (present on admission)  - Fluid status: Hypovolemia on admission     - Cachexia  - Encephalopathy: Metabolic encephalopathy   --Pressure wound ruled out, treating bilateral buttocks moisture and friction damage     Code Status: FULL    Farrel Conners, MD  12/18/21

## 2021-12-19 LAB — COMPREHENSIVE METABOLIC PANEL (BMP, AST, ALT, T.BILI, ALKP, TP ALB)
AST: 68 U/L — ABNORMAL HIGH (ref 5–44)
Alanine transaminase: 66 U/L — ABNORMAL HIGH (ref 10–61)
Albumin, Serum / Plasma: 1.3 g/dL — ABNORMAL LOW (ref 3.4–4.8)
Alkaline Phosphatase: 566 U/L — ABNORMAL HIGH (ref 38–108)
Anion Gap: 6 (ref 4–14)
Bilirubin, Total: 15.2 mg/dL — ABNORMAL HIGH (ref 0.2–1.2)
Calcium, total, Serum / Plasma: 7.1 mg/dL — ABNORMAL LOW (ref 8.4–10.5)
Carbon Dioxide, Total: 21 mmol/L — ABNORMAL LOW (ref 22–29)
Chloride, Serum / Plasma: 109 mmol/L (ref 101–110)
Creatinine: 0.29 mg/dL — ABNORMAL LOW (ref 0.55–1.02)
Glucose, non-fasting: 85 mg/dL (ref 70–199)
Potassium, Serum / Plasma: 3.1 mmol/L — ABNORMAL LOW (ref 3.5–5.0)
Protein, Total, Serum / Plasma: 2.9 g/dL — ABNORMAL LOW (ref 6.3–8.6)
Sodium, Serum / Plasma: 136 mmol/L (ref 135–145)
Urea Nitrogen, Serum / Plasma: 4 mg/dL — ABNORMAL LOW (ref 7–25)
eGFRcr: 122 mL/min/{1.73_m2} (ref >59)

## 2021-12-19 LAB — POCT GLUCOSE
Glucose, Glucometer: 98 mg/dL (ref 70–199)
Glucose, Glucometer: 90 mg/dL (ref 70–199)

## 2021-12-19 LAB — COMPLETE BLOOD COUNT WITH DIFFERENTIAL
Abs Basophils: 0 10*9/L (ref 0.00–0.10)
Abs Eosinophils: 0 10*9/L (ref 0.00–0.40)
Abs Imm Granulocytes: 0.04 10*9/L (ref <0.10)
Abs Lymphocytes: 0.21 10*9/L — ABNORMAL LOW (ref 1.00–3.40)
Abs Monocytes: 0.07 10*9/L — ABNORMAL LOW (ref 0.20–0.80)
Abs Neutrophils: 1.7 10*9/L — ABNORMAL LOW (ref 1.80–6.80)
Hematocrit: 27.9 % — ABNORMAL LOW (ref 36.0–46.0)
Hemoglobin: 9.5 g/dL — ABNORMAL LOW (ref 12.0–15.5)
MCH: 34.3 pg — ABNORMAL HIGH (ref 26.0–34.0)
MCHC: 34.1 g/dL (ref 31.0–36.0)
MCV: 101 fL — ABNORMAL HIGH (ref 80–100)
MPV: 13.4 fL — ABNORMAL HIGH (ref 9.1–12.6)
Platelet Count: 76 10*9/L — ABNORMAL LOW (ref 140–450)
RBC Count: 2.77 10*12/L — ABNORMAL LOW (ref 4.00–5.20)
RDW-CV: 23.5 % — ABNORMAL HIGH (ref 11.7–14.4)
WBC Count: 2 10*9/L — ABNORMAL LOW (ref 3.4–10.0)

## 2021-12-19 LAB — PROTHROMBIN TIME
INR: 1.4 — ABNORMAL HIGH (ref 0.9–1.2)
PT: 16.1 s — ABNORMAL HIGH (ref 11.6–15.0)

## 2021-12-19 LAB — CENTRAL BLOOD CULTURE
Central Blood Culture: NO GROWTH
Central Blood Culture: NO GROWTH

## 2021-12-19 LAB — HEPATITIS E ANTIBODY, IGM: Hep E Ab IgM: NOT DETECTED

## 2021-12-19 MED ORDER — FENTANYL (PF) 50 MCG/ML INJECTION SOLUTION
50 | INTRAMUSCULAR | Status: AC
Start: 2021-12-19 — End: ?

## 2021-12-19 MED FILL — GANCICLOVIR SODIUM 500 MG INTRAVENOUS SOLUTION: 500 mg | INTRAVENOUS | Qty: 8

## 2021-12-19 MED FILL — DILAUDID (PF) 0.5 MG/0.5 ML INJECTION SYRINGE: 0.5 mg/ mL | INTRAMUSCULAR | Qty: 0.5

## 2021-12-19 MED FILL — FENTANYL (PF) 50 MCG/ML INJECTION SOLUTION: 50 mcg/mL | INTRAMUSCULAR | Qty: 2

## 2021-12-19 MED FILL — URSODIOL 300 MG CAPSULE: 300 mg | ORAL | Qty: 1

## 2021-12-19 MED FILL — POTASSIUM CHLORIDE 40 MEQ/100ML IN STERILE WATER INTRAVENOUS PIGGYBACK: 40 mEq/100 mL | INTRAVENOUS | Qty: 100

## 2021-12-19 NOTE — Plan of Care (Signed)
Problem: Fall, at Risk or Actual - Adult  Goal: Absence of falls and fall related injury  Outcome: Progress within 12 hours  Goal: Knowledge of fall prevention  Outcome: Progress within 12 hours     Problem: Delirium - Adult / Pediatric  Goal: Absence or resolution of delirium  Outcome: Progress within 12 hours

## 2021-12-19 NOTE — Progress Notes (Signed)
[facility] PROGRESS NOTE  ATTENDING ONLY     My date of service is 12/19/21.    24 Hour Course  - MRI brain pending   - Path pending on liver and colonic biopsies  - Pulled foley out overnight, required mittents  - ordered [place] work up    Subjective  Patient is not following commands    Vitals  Temp:  [36.2 C (97.2 F)-37.1 C (98.8 F)] 37.1 C (98.8 F)  Heart Rate:  [71-83] 74  *Resp:  [16-18] 18  BP: (98-121)/(68-84) 98/69  SpO2:  [95 %-100 %] 100 %    MostRecent Weight: 89.8 kg (197 lb 15.6 oz)  Admission Weight: 79.6 kg (175 lb 7.8 oz)      Intake/Output Summary (Last 24 hours) at 12/19/2021 1355  Last data filed at 12/19/2021 1200  Gross per 24 hour   Intake 2584 ml   Output 2145 ml   Net 439 ml       Pain Score: Eyes closed, patient calm    Physical Exam  Appearance: She is ill-appearing.      Comments: Alert and oriented X 1-2 slow to respond. Confused.    HENT:      Head: Normocephalic and atraumatic.      Nose: Nose normal.   Neck:  R IJ CVC with dressing in place, c/d/i  Eyes:      General: Scleral icterus present.      Extraocular Movements: Extraocular movements intact.      Conjunctiva/sclera: Conjunctivae normal.      Pupils: Pupils are equal, round, and reactive to light.   Cardiovascular:      Rate and Rhythm: Normal rate and regular rhythm.      Pulses: Normal pulses.      Heart sounds: Normal heart sounds. No murmur heard.    No friction rub. No gallop.   Pulmonary:      Effort: Pulmonary effort is normal. No respiratory distress.      Breath sounds: Normal breath sounds. No stridor. No wheezing, rhonchi or rales.   Abdominal:      General: Abdomen is flat. Bowel sounds are normal. There is no distension.      Palpations: Abdomen is soft. There is no mass.      Tenderness: There is no abdominal tenderness. There is no guarding or rebound.      Hernia: No hernia is present.      Comments: Foley catheter in place, urine appears dark  No suprapubic tenderness.   Musculoskeletal:          General: Normal range of motion.      Cervical back: Normal range of motion and neck supple.      Right lower leg: Edema present.      Left lower leg: Edema present.      Comments: Strength 1/5 in the bilateral lower extremities, unable to raise them to gravity.   Pitting edema 3-4+ in the bilateral lower extremities.   Anasarca in the abdomen and thighs bilaterally.    Skin:     General: Skin is warm.      Capillary Refill: Capillary refill takes less than 2 seconds.      Coloration: Skin is jaundiced.      Findings: Bruising present.      Comments: ecchymoses on the sternum   Neurological:      Mental Status: She is alert. She is disoriented.      Motor: Weakness present.  Scheduled Meds:   0.9% sodium chloride flush  10-20 mL Intravenous Q12H [place]    0.9% sodium chloride flush  3 mL Intravenous Q12H [place]    dry mouth oral rinse  5 mL Mucous Membrane 4x Daily [place]    ganciclovir (CYTOVENE) IVPB  5 mg/kg (Dosing Weight) Intravenous Q12H [place]    ursodioL  300 mg Oral BID [place]     Continuous Infusions:   dextrose 5 % and 0.9 % sodium chloride 75 mL/hr (12/19/21 0821)     PRN Meds:   0.9% sodium chloride flush  10-20 mL Intravenous PRN    0.9% sodium chloride flush  3 mL Intravenous PRN    dextrose 50%  12.5 g Intravenous Q15 Min PRN    Or    glucose  20 g Oral Q15 Min PRN    Or    juice (for hypoglycemia)  16 g Oral Q15 Min PRN    HYDROmorphone  0.3-0.8 mg Intravenous Q4H PRN    magnesium sulfate in dextrose 5 %  2-4 g Intravenous Daily PRN    Or    magnesium sulfate in water  2-4 g Intravenous Daily PRN    metoclopramide  5-10 mg Intravenous Q8H PRN    ondansetron  8 mg Intravenous Q8H PRN    polyethylene glycol  17 g Oral Daily PRN    potassium chloride in sterile water  20-80 mEq Intravenous Daily PRN    Or    potassium chloride in sterile water  20-80 mEq Intravenous Daily PRN    Or    potassium chloride  20-80 mEq Oral Daily PRN       Data    CBC        12/19/21  0347 12/18/21  0434    WBC 2.0* 1.9*   HGB 9.5* 9.3*   HCT 27.9* 27.6*   PLT 76* 76*     Coags        12/19/21  0347 12/18/21  0434   INR 1.4* 1.4*     Chem7        12/19/21  0347 12/18/21  0434   NA 136 134*   K 3.1* 3.0*   CL 109 108   CO2 21* 20*   BUN 4* 5*   CREAT 0.29* 0.33*   GLU 85 84     Electrolytes        12/19/21  0347 12/18/21  0434   CA 7.1* 7.0*     Liver Panel        12/19/21  0347 12/18/21  0434   AST 68* 70*   ALT 66* 72*   ALKP 566* 540*   TBILI 15.2* 14.8*   TP 2.9* 2.9*   ALB 1.3* 1.3*     Lactate  No results found in last 36 hours    Microbiology Results (last 24 hours)     Procedure Component Value Units Date/Time    Central Blood Culture [[id number]] Collected: 12/13/21 0350    Order Status: Completed Specimen: Not Applicable from Central Blood Updated: 12/19/21 0249     Central Blood Culture No growth 6 days.    Central Blood Culture [[id number]] Collected: 12/13/21 0351    Order Status: Completed Specimen: Not Applicable from Central Blood Updated: 12/19/21 0249     Central Blood Culture No growth 6 days.    Hepatitis E AntibodyBerneta Sages [[id number]] Collected: 12/13/21 0302    Order Status: Completed Specimen: Not Applicable from Serum Updated: 12/18/21 1819  Hep E Ab IgM NOT DETECTED     Comment: (NOTE)  REFERENCE RANGE: NOT DETECTED    Hepatitis E Virus (HEV) is a major cause of  enteric non-A hepatitis worldwide. HEV IgM is  typically detected within 2-4 weeks after  infection, and then persists for about two months    This test was developed and its analytical performance  characteristics have been determined by [employer].  It has not been cleared or approved by FDA. This assay has  validated pursuant to the CLIA regulations and is used  for clinical purposes.    Test Performed at:  [facility] [place]  [address]     Knute Neu MD, PhD         GI Parasite Panel PCR [[id number]] Collected: 12/16/21 0945    Order Status: Completed Specimen:  Stool in [place] Updated: 12/18/21 1544     Comments ADD ON TEST     Comments Repeat testing in progress.    GI Viral Panel PCR [[id number]] Collected: 12/16/21 0945    Order Status: Completed Specimen: Stool in [place] Updated: 12/18/21 1544     Comments ADD ON TEST     Comments Repeat testing in progress.    GI Bacteria Panel PCR [[id number]] Collected: 12/16/21 0945    Order Status: Completed Specimen: Stool in [place] Updated: 12/18/21 1544     Comments ADD ON TEST     Comments Repeat testing in progress.          Radiology Results  No results found.    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 The patient has received VRd X 4 (achieving VGPR) then Mel 200 ASCT 05/12/11. 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 She met the criteria for PD on 02/19/2020 and was started on third line DARA PD in June 2021, which was recently held on 10/20/21 due to chronic diarrhea. Patient was transferred OSH after recent hypovolemic/distributive shock. Here for acute encephalopathy workup and acute liver failure workup.      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   June 2020 due to progression of M spike Revlimid was increased to 15 mg PO D1-21 every 28 days, plus dexamethasone  July 2021: started daratumumab, Pomylast 2 mg D 1-21, Q28 days, dexamethasone 12 mg PO Q weekly.   Primary oncologist: Dr. Iona Coach,  Dr. Kennon Rounds [facility]  Receiving dexamethasone 12 mg weekly for diarrhea  Receiving MT with daratumumab with pomalyst and dex, most recent dose 10/16/21, has since been on hold due to worsening diarrhea.     DATA:   09/07/2010: IgG Kappa MM  12/02/2021: Ig M <5, IgG 447, IgA 20, Ig E <2, SFLC Kappa 16.6,  Lambda 1.6, Kappa/Lamda 10.38  12/14/21: IgG K on IFE, IgG 430, KLC 16.6, LLC 2.7, KLR 6.15    Chemo:   Holding Daratumumab and Pomalyst, last dose 10/20/21, given recent infection and acute liver failure.    Supportive Care:   Transfusion: Keep Hg >7, and Platelets >10.   Per daughter, has autoantibodies in blood was a difficult match. Would like to be  contacted prior to transfusion.   Line: PICC placed 1/26  Outpatient Oncologist: Dr. Kennon Rounds  Dispo: TBD, after resolution of sepsis and liver failure    # Immunocompromised  #CMV colitis  # At risk for infections due to malignancy  # Shock, likely hypovolemic vs distributive in the setting of chronic diarrhea, + norovirus infection   # Acute on chronic diarrhea, norovirus positive., diff and OP negative.   -No prior evidence of adrenal insufficiency, AM cortisol levels normal, previously on dexamethasone 8 mg PO Qweekly, but has been held since early December. CMV PCR on 1/    Data:  - Ucx: pending  - Bcx: pending  - CT brain: No acute intracranial hemorrhage, herniation, or hydrocephalus.  - CT chest/A/P pending: Medium-sized bilateral pleural effusions with bilateral peribronchovascular groundglass opacities, suggestive of pulmonary edema. Clustered nodules in right middle lobe along the right minor fissure, which could represent mild aspiration or infection. Numerous sclerotic and mixed lytic/sclerotic lesions throughout the thoracic spine and bilateral ribs, increased in size and number compared to prior. Appearance is atypical for multiple myeloma but may represent a combination of treated, partially treated, and active disease. No acute pathologic fracture. Findings compatible with enterocolitis of infectious or inflammatory etiology. Involvement of the colon is greater than of the small bowel. Marked hepatic steatosis. Increased sclerosis of bone lesions throughout the thoracolumbar spine, bony pelvis, and proximal left femur. Appearance is atypical for  multiple myeloma but may represent a combination of treated, partially treated, and active disease. No acute pathologic fracture.  - 1/29:CMV PCR [id number]  - 1/31: GI PCR panel pending [ ]    2/1: LP results: Neutrophils 6% in tube 1, Lymphocytes 88% (predominant), protein 42 (normal), glucose 36 (low), rapid HSV in CSF negative. CMV CSF PCR negative   - MRI brain pending [ ]     - 2/2 CMV PCR 179,555High   - 2/2 Flex Sig Biopsies pending [ ]    - 2/5 HLH work up: sIL2, ferritin, TG, NKA pending [ ]      Plan:  - appreciate ID reccs   - Ganciclovir 5 mg/kg (1/31-)    - GI consult: appreciate reccs: flex sig 2/2    - stop zosyn: s/p IV Zosyn 4.5 g Q8hrs (1/26-1/28, restarted 1/29 given recurrent fever-12/16/21)  - checking weekly CMV, next 2/8 [ ]    - OI ppx: Acyclovir IV    # Grade III neutropenia, at baseline  # Leukopenia  #Thrombocytopenia (possibly 2/2 liver disease)  Data: WBC of 2.2 on 12/05/21, on Zarxio at home  Plan: Continue to monitor    # Acute liver failure of unclear etiology  # Acute on chronic encephalopathy likely 2/2 infectious vs hepatic vs metabolic  # Hyperbilirubinemia  # Anasarca  # Hypoalbuminemia    Data:   -Steady rise in LFTs in 05/2021,  -12/02/21: AST 152--> 121, ALT 130--> 110, TB 5.4, Albumin 1.1  -Hepatitis panel negative.   -1/26:  Tbili 14.6, GGT 1160, ammonia 25  -CTAP 12/02/21: Scattered sclerotic lesions in the lumbar spine, hepatic steatosis.   -RUQ ultrasound: liver enlarged with diffuse increased echogenicity, consistent with fatty infiltration. 2cm stone in the gallbladder.   -HIDA scan: Normal radiotracer filling of the gallbladder is identified, therefore the exam is not consistent with cystic duct obstruction or acute cholecystitis.  - [place] abd 1/27: Hepatomegaly with diffusely increased echogenicity which can be seen with hepatic amyloidosis or steatosis. Please refer to the recently performed biopsy for further details. Mild perihepatic  ascites. Patent hepatic vasculature  with normal waveforms. Cholelithiasis without acute cholecystitis. Right pleural effusion.  Portal pressures:  Mean right atrial pressure (mmHg): 4  Mean inferior vena cava pressure (mmHg): 5  Mean free hepatic vein pressure (mmHg): 8   Mean wedged hepatic vein pressure (mmHg): 16   - 1/27 Liver Biopsy: Preliminary results showed extensive ballooning and parenchymal steatosis occupying majority of liver parenchyma. There was no evidence of significant inflammation, cholestasis or infiltration.   - 1/30: Serum studies: Ferritin 2380, iron serum 60, transferrin 46 (low), 93 % sat (high), HEV IgM negative, PCR forVZV negative, HSV negative,CMV+ 443,961High,EBV negative, AMA negative, Hepatitis B core Ab, surface Ab negative, Hep A IgG +/IgM negative   IgG4 pending [ ]      Plan:   - Appreciate Hepatology consult:    Per hepatology: "Based on history, there is no history of heavy alcohol use. While she has some risk factors for NAFLD, the  extent of hepatic steatosis is beyond natural history of NASH. At this juncture, the most likely DDx is secondary causes of rapidly  progressive hepatic steatosis...history of steroid use is the most likely scenario in her case."   - Recommendations:    - Consider avoiding steroid    - Daily LFT/INR    - RD consult [ ]  placed  - final path for Liver bx 1/27 pending [ ]      #AMS  In the setting of liver failure, ammonia only 25. LP performed 2/1 to r/o encephalitis.     Data:   1/31: HIV, TSH, Folate, B12 WNL  1/30: CT head WNL other than sinus disease  2/1: LP results: Neutrophils 6% in tube 1, Lymphocytes 88% (predominant), protein 42 (normal), glucose 36 (low), rapid HSV in CSF negative. CMV CSF PCR negative  Brain MRI pending [ ]      Plan:  - given CMV elevation, c/f CMV encephalitis, ID c/s to discuss need for further work up/changes to therapy [ ]    - consider neurology c/s    # History of PE, diagnosed in April 2019  - hold home [place] for now given recent  anemia and concerns for bleeding.   - monitor for signs and symptoms of worsening bleeding.     # Elevated TSH at OSH, likely 2/2 subclinical hypothyroidism  -TSH WNL on 12/15/20.      # Anxiety, emotional distress:   -Patient has stressful social situation, intellectually disabled son, and regarding underlying disease process.     #Chronic buttocks pressure wound  -Wound care consult, appreciate recs  -Position changes as tolerated, waffle cushion, etc    #VTE PPx:  Contraindicated: Anticipated procedure    Severity of Illness     During this hospitalization the patient is also being treated for:  - Sepsis (present on admission)    - Malignancy associated fatigue on admission  - Neoplasm/malignancy associated pain  - Hypoalbuminemia (present on admission)  - Fluid status: Hypovolemia on admission     - Cachexia  - Encephalopathy: Metabolic encephalopathy   --Pressure wound ruled out, treating bilateral buttocks moisture and friction damage     Code Status: FULL    Farrel Conners, MD  12/19/21

## 2021-12-19 NOTE — Consults (Signed)
DOS: 12/19/21     NUTR[employer] SERVICES ADULT COMPREHENSIVE SCREEN    Patient History:    TAMYRA FOJTIK is a 61 y.o. female admitted for Multiple myeloma (CMS code) [C90.00].    Anthropometrics:   Height: 153 cm (5' 0.24")  Ideal Body Weight: 45.99 kg    Usual body weight: 90.91 kg (per daughter stated UBW of pt "little over 200 lbs")  Admit weight: 79.6 kg (175 lb 7.8 oz) (12/09/21 2356) (Scale type: Bed scale (zeroed prior to use)  Current weight: 89.8 kg (197 lb 15.6 oz) (12/17/21 0304) (Scale type:  )    Calculation Weight: 89.88 kg (Percent IBW (calculated) (%): 195)  Calc weight BMI: 38.4 kg/m^2    Wt Readings from Last 10 Encounters:   12/17/21 89.8 kg (197 lb 15.6 oz)   12/12/18 96.3 kg (212 lb 4.9 oz)   08/08/18 98.9 kg (218 lb 1.6 oz)   05/23/18 98.7 kg (217 lb 9.6 oz)   04/11/18 98.5 kg (217 lb 1.6 oz)   01/10/18 98.4 kg (217 lb)   09/16/17 96.6 kg (213 lb)   06/21/17 96.4 kg (212 lb 9.6 oz)   03/15/17 95.6 kg (210 lb 12.8 oz)   12/14/16 94.7 kg (208 lb 12.8 oz)       Total weight change: 10.2 kg (12.81 %)  Time frame for weight change: 8 days    Assessment of Weight Change: Unplanned weight gain  Pt noted to have edema present. Overall, daughter states she noticed pt's wt decline over the past 4 months, where it may have dropped down to 150-160 lbs during hospitalization at [facility].    Nutrition-focused physical findings:   GI Findings and Assessment: BM 2/3 (soft, brown, smear)      GI: Appetite, decrease, Denies GI complaints     Lines, Drains, Airway, Wounds:   Patient Lines/Drains/Airways Status     Active LDAs     Name Placement date Placement time Site Days    PICC Double Lumen 12/10/21 Power Central Left;Upper Arm 1:Purple 2:Red 7 cm 12/10/21  1534  Arm  8    Indwelling Urinary Catheter 12/19/21 16 12/19/21  0550  --  less than 1                In-House Nutrition Orders:  Regular Diet       Food and Nutrition Intake:   Information obtained from: Patient, Family, Chart (Pt only nodding/shaking head  to questions asked, reached out to daughter [name] who has been visiting pt daily.)    Oral Food and Fluid Intake  Baseline Modified Diet: Texture Modification  Meal/Snack Pattern: Grazing  Meal Intake Percentage: <50% of meals  Poor Meal Intake Assessment Period: greater than or equal to 1 month (Unable to consume no more than 50% of meals since late-December to early January, more significantly decreased past few days, per daughter)  Diet Recall: Other (comment), Typical day  Diet Recall Details: In-house (1/28-2/4) having bites of soup, pudding, ice cream, jello, and shakes. Minimal to no Boost. Prior to admit for over 4 months, began eating soft/blended foods similar to in-house intake.  Food Allergies/Intolerances: No food allergies mentioned    Behavior  Mealtime Behavior: Long meal duration, Refusal to eat/chew    Knowledge, Beliefs, Attitudes  Food and Nutrition Knowledge:  (unable to assess at this time with patient)    Physical Activity and Function  Nutrition related ADLs and IADLs (if applicable): Physically unable to self-feed (total assist)  Supportive Therapies: PT, OT, SLP (ST last seen 1/30)  Physical activity details: in-house PT frequency 5x/week    Nutrition Interventions and Plan of Care:  Oral Nutrition:   Inpatient Orders: Current diet order appropriate  Nutrition recommendations to patient: Recommend general, healthful diet (Receiving assistance with feeds)  Recommend Feeding Assistance: Provided menu selection assistance (discussed with daughter)     Recommendation for supplements to patient: Start and educate patient on supplemental nourishment (Discussed ways daughter can help incorporate Boost to pt's meals for optimization; enjoys drinking in-house shakes, will continue to provide as well.)  Type of supplemental nourishment: Oral nutrition supplement  Supplement name and nutrient provision: Boost Plus vanilla (360 kcals, 14g pro per carton)  Supplement frequency recommendation: 2x  daily       Nutrition Monitoring/Evaluation:  Nutrition Goal Indicator: Food intake  Nutrition Goal Criteria: Intake of greater than or equal to 75% of 3 meals per day  Nutrition Goal Progress: New goal identified  Nutrition Goal 2 Indicator: Food intake  Nutrition Goal 2 Criteria: Intake of 1-3 oral nutrition supplements per day  Nutrition Goal 2 Progress: New goal identified      Lorenzo Pereyra SICAT Rosaline Ezekiel, DTR

## 2021-12-20 LAB — COMPREHENSIVE METABOLIC PANEL (BMP, AST, ALT, T.BILI, ALKP, TP ALB)
AST: 70 U/L — ABNORMAL HIGH (ref 5–44)
Alanine transaminase: 64 U/L — ABNORMAL HIGH (ref 10–61)
Albumin, Serum / Plasma: 1.3 g/dL — ABNORMAL LOW (ref 3.4–4.8)
Alkaline Phosphatase: 549 U/L — ABNORMAL HIGH (ref 38–108)
Anion Gap: 7 (ref 4–14)
Bilirubin, Total: 15 mg/dL — ABNORMAL HIGH (ref 0.2–1.2)
Calcium, total, Serum / Plasma: 7.4 mg/dL — ABNORMAL LOW (ref 8.4–10.5)
Carbon Dioxide, Total: 21 mmol/L — ABNORMAL LOW (ref 22–29)
Chloride, Serum / Plasma: 109 mmol/L (ref 101–110)
Creatinine: 0.29 mg/dL — ABNORMAL LOW (ref 0.55–1.02)
Glucose, non-fasting: 78 mg/dL (ref 70–199)
Potassium, Serum / Plasma: 3 mmol/L — ABNORMAL LOW (ref 3.5–5.0)
Protein, Total, Serum / Plasma: 2.9 g/dL — ABNORMAL LOW (ref 6.3–8.6)
Sodium, Serum / Plasma: 137 mmol/L (ref 135–145)
Urea Nitrogen, Serum / Plasma: 3 mg/dL — ABNORMAL LOW (ref 7–25)
eGFRcr: 122 mL/min/{1.73_m2} (ref >59)

## 2021-12-20 LAB — COMPLETE BLOOD COUNT WITH DIFFERENTIAL
Abs Basophils: 0.01 10*9/L (ref 0.00–0.10)
Abs Eosinophils: 0.01 10*9/L (ref 0.00–0.40)
Abs Imm Granulocytes: 0.05 10*9/L (ref <0.10)
Abs Lymphocytes: 0.28 10*9/L — ABNORMAL LOW (ref 1.00–3.40)
Abs Monocytes: 0.09 10*9/L — ABNORMAL LOW (ref 0.20–0.80)
Abs Neutrophils: 2.12 10*9/L (ref 1.80–6.80)
Hematocrit: 27.4 % — ABNORMAL LOW (ref 36.0–46.0)
Hemoglobin: 9.4 g/dL — ABNORMAL LOW (ref 12.0–15.5)
MCH: 34.8 pg — ABNORMAL HIGH (ref 26.0–34.0)
MCHC: 34.3 g/dL (ref 31.0–36.0)
MCV: 102 fL — ABNORMAL HIGH (ref 80–100)
MPV: 13.9 fL — ABNORMAL HIGH (ref 9.1–12.6)
Platelet Count: 83 10*9/L — ABNORMAL LOW (ref 140–450)
RBC Count: 2.7 10*12/L — ABNORMAL LOW (ref 4.00–5.20)
RDW-CV: 24 % — ABNORMAL HIGH (ref 11.7–14.4)
WBC Count: 2.6 10*9/L — ABNORMAL LOW (ref 3.4–10.0)

## 2021-12-20 LAB — IGG SUBCLASSES 1-4
Immunoglobulin G Subclass 1: 308 mg/dL (ref 240–1118)
Immunoglobulin G Subclass 2: 25 mg/dL — ABNORMAL LOW (ref 124–549)
Immunoglobulin G Subclass 3: 11 mg/dL — ABNORMAL LOW (ref 21–134)
Immunoglobulin G Subclass 4: 2 mg/dL (ref 1–123)

## 2021-12-20 LAB — FERRITIN: Ferritin, Serum/Plasma: 948 ug/L — ABNORMAL HIGH (ref 18–340)

## 2021-12-20 LAB — PROTHROMBIN TIME
INR: 1.3 — ABNORMAL HIGH (ref 0.9–1.2)
PT: 15.9 s — ABNORMAL HIGH (ref 11.6–15.0)

## 2021-12-20 LAB — POCT GLUCOSE
Glucose, Glucometer: 90 mg/dL (ref 70–199)
Glucose, Glucometer: 90 mg/dL (ref 70–199)

## 2021-12-20 LAB — TRIGLYCERIDES: Triglycerides, serum: 259 mg/dL — ABNORMAL HIGH (ref <200)

## 2021-12-20 MED ORDER — HYDROMORPHONE (PF) 0.5 MG/0.5 ML INJECTION SYRINGE
0.5 | Freq: Once | INTRAMUSCULAR | Status: AC | PRN
Start: 2021-12-20 — End: 2021-12-20
  Administered 2021-12-20: 22:00:00 via INTRAVENOUS

## 2021-12-20 MED ORDER — LORAZEPAM 0.5 MG TABLET
0.5 | Freq: Once | ORAL | Status: DC | PRN
Start: 2021-12-20 — End: 2022-01-01

## 2021-12-20 MED ORDER — GADOTERATE MEGLUMINE 0.5 MMOL/ML (376.9 MG/ML) INTRAVENOUS SOLUTION
0.5 | Freq: Once | INTRAVENOUS | Status: AC
Start: 2021-12-20 — End: 2021-12-20
  Administered 2021-12-20: via INTRAVENOUS

## 2021-12-20 MED FILL — DILAUDID (PF) 0.5 MG/0.5 ML INJECTION SYRINGE: 0.5 mg/ mL | INTRAMUSCULAR | Qty: 0.5

## 2021-12-20 MED FILL — POTASSIUM CHLORIDE 20 MEQ/50 ML IN STERILE WATER INTRAVENOUS PIGGYBACK: 20 mEq/50 mL | INTRAVENOUS | Qty: 50

## 2021-12-20 MED FILL — ONDANSETRON HCL (PF) 4 MG/2 ML INJECTION SOLUTION: 4 mg/2 mL | INTRAMUSCULAR | Qty: 4

## 2021-12-20 MED FILL — POTASSIUM CHLORIDE 40 MEQ/100ML IN STERILE WATER INTRAVENOUS PIGGYBACK: 40 mEq/100 mL | INTRAVENOUS | Qty: 100

## 2021-12-20 MED FILL — URSODIOL 300 MG CAPSULE: 300 mg | ORAL | Qty: 1

## 2021-12-20 MED FILL — GANCICLOVIR SODIUM 500 MG INTRAVENOUS SOLUTION: 500 mg | INTRAVENOUS | Qty: 8

## 2021-12-20 MED FILL — DILAUDID (PF) 0.5 MG/0.5 ML INJECTION SYRINGE: 0.5 mg/ mL | INTRAMUSCULAR | Qty: 1

## 2021-12-20 MED FILL — FENTANYL (PF) 50 MCG/ML INJECTION SOLUTION: 50 mcg/mL | INTRAMUSCULAR | Qty: 2

## 2021-12-20 NOTE — Plan of Care (Signed)
Problem: Fall, at Risk or Actual - Adult  Goal: Absence of falls and fall related injury  Outcome: Progress within 12 hours  Goal: Knowledge of fall prevention  Outcome: Progress within 12 hours     Problem: Delirium - Adult / Pediatric  Goal: Absence or resolution of delirium  Outcome: Progress within 12 hours     Problem: Pain,  Acute / Chronic- Adult  Goal: Control of chronic pain  Outcome: Progress within 12 hours  Goal: Control of acute pain  Outcome: Progress within 12 hours

## 2021-12-20 NOTE — Plan of Care (Signed)
Problem: Discharge Planning - Adult  Goal: Knowledge of and participation in plan of care  Outcome: Progress within 12 hours     Problem: Fall, at Risk or Actual - Adult  Goal: Absence of falls and fall related injury  Outcome: Progress within 12 hours  Goal: Knowledge of fall prevention  Outcome: Progress within 12 hours     Problem: Delirium - Adult / Pediatric  Goal: Absence or resolution of delirium  Outcome: Progress within 12 hours

## 2021-12-20 NOTE — Progress Notes (Signed)
[facility] PROGRESS NOTE  ATTENDING ONLY     My date of service is 12/20/21.    24 Hour Course  - MRI brain pending   - Path pending on liver and colonic biopsies  - Pulled foley out overnight, required mittents  - ordered [place] work up    Subjective  Patient is not following commands    Vitals  Temp:  [36.1 C (97 F)-37.1 C (98.8 F)] 36.6 C (97.9 F)  Heart Rate:  [74-86] 74  *Resp:  [16-20] 18  BP: (108-118)/(73-89) 109/74  SpO2:  [97 %-98 %] 98 %    MostRecent Weight: 90.6 kg (199 lb 11.8 oz)  Admission Weight: 79.6 kg (175 lb 7.8 oz)      Intake/Output Summary (Last 24 hours) at 12/20/2021 1215  Last data filed at 12/20/2021 3570  Gross per 24 hour   Intake 1874 ml   Output 2000 ml   Net -126 ml       Pain Score: Eyes closed, patient calm    Physical Exam  Appearance: She is ill-appearing.      Comments: Alert and oriented X 1-2 slow to respond. Confused.    HENT:      Head: Normocephalic and atraumatic.      Nose: Nose normal.   Neck:  R IJ CVC with dressing in place, c/d/i  Eyes:      General: Scleral icterus present.      Extraocular Movements: Extraocular movements intact.      Conjunctiva/sclera: Conjunctivae normal.      Pupils: Pupils are equal, round, and reactive to light.   Cardiovascular:      Rate and Rhythm: Normal rate and regular rhythm.      Pulses: Normal pulses.      Heart sounds: Normal heart sounds. No murmur heard.    No friction rub. No gallop.   Pulmonary:      Effort: Pulmonary effort is normal. No respiratory distress.      Breath sounds: Normal breath sounds. No stridor. No wheezing, rhonchi or rales.   Abdominal:      General: Abdomen is flat. Bowel sounds are normal. There is no distension.      Palpations: Abdomen is soft. There is no mass.      Tenderness: There is no abdominal tenderness. There is no guarding or rebound.      Hernia: No hernia is present.      Comments: Foley catheter in place, urine appears dark  No suprapubic tenderness.   Musculoskeletal:          General: Normal range of motion.      Cervical back: Normal range of motion and neck supple.      Right lower leg: Edema present.      Left lower leg: Edema present.      Comments: Strength 1/5 in the bilateral lower extremities, unable to raise them to gravity.   Pitting edema 3-4+ in the bilateral lower extremities.   Anasarca in the abdomen and thighs bilaterally.    Skin:     General: Skin is warm.      Capillary Refill: Capillary refill takes less than 2 seconds.      Coloration: Skin is jaundiced.      Findings: Bruising present.      Comments: ecchymoses on the sternum   Neurological:      Mental Status: She is alert. She is disoriented.      Motor: Weakness present.  Scheduled Meds:   0.9% sodium chloride flush  10-20 mL Intravenous Q12H [place]    0.9% sodium chloride flush  3 mL Intravenous Q12H [place]    dry mouth oral rinse  5 mL Mucous Membrane 4x Daily [place]    ganciclovir (CYTOVENE) IVPB  5 mg/kg (Dosing Weight) Intravenous Q12H [place]    ursodioL  300 mg Oral BID [place]     Continuous Infusions:   dextrose 5 % and 0.9 % sodium chloride 75 mL/hr (12/19/21 2207)     PRN Meds:   0.9% sodium chloride flush  10-20 mL Intravenous PRN    0.9% sodium chloride flush  3 mL Intravenous PRN    dextrose 50%  12.5 g Intravenous Q15 Min PRN    Or    glucose  20 g Oral Q15 Min PRN    Or    juice (for hypoglycemia)  16 g Oral Q15 Min PRN    HYDROmorphone  0.3-0.8 mg Intravenous Q4H PRN    magnesium sulfate in dextrose 5 %  2-4 g Intravenous Daily PRN    Or    magnesium sulfate in water  2-4 g Intravenous Daily PRN    metoclopramide  5-10 mg Intravenous Q8H PRN    ondansetron  8 mg Intravenous Q8H PRN    polyethylene glycol  17 g Oral Daily PRN    potassium chloride in sterile water  20-80 mEq Intravenous Daily PRN    Or    potassium chloride in sterile water  20-80 mEq Intravenous Daily PRN    Or    potassium chloride  20-80 mEq Oral Daily PRN       Data    CBC        12/20/21  0341 12/19/21  0347    WBC 2.6* 2.0*   HGB 9.4* 9.5*   HCT 27.4* 27.9*   PLT 83* 76*     Coags        12/20/21  0341 12/19/21  0347   INR 1.3* 1.4*     Chem7        12/20/21  0341 12/19/21  0347   NA 137 136   K 3.0* 3.1*   CL 109 109   CO2 21* 21*   BUN 3* 4*   CREAT 0.29* 0.29*   GLU 78 85     Electrolytes        12/20/21  0341 12/19/21  0347   CA 7.4* 7.1*     Liver Panel        12/20/21  0341 12/19/21  0347   AST 70* 68*   ALT 64* 66*   ALKP 549* 566*   TBILI 15.0* 15.2*   TP 2.9* 2.9*   ALB 1.3* 1.3*     Lactate  No results found in last 36 hours    Microbiology Results (last 24 hours)     ** No results found for the last 24 hours. **          Radiology Results  No results found.    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 The patient has received VRd X 4 (achieving VGPR) then Mel 200 ASCT 05/12/11. 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 She met the criteria for PD on 02/19/2020 and was started on third line DARA PD in June 2021, which was recently held on 10/20/21 due to  chronic diarrhea. Patient was transferred OSH after recent hypovolemic/distributive shock. Here for acute encephalopathy workup and acute liver failure workup.      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   June 2020 due to progression of M spike Revlimid was increased to 15 mg PO D1-21 every 28 days, plus dexamethasone  July 2021: started daratumumab, Pomylast 2 mg D 1-21, Q28 days, dexamethasone 12 mg PO Q weekly.   Primary oncologist: Dr. Iona Coach,  Dr. Kennon Rounds [place] [facility]  Receiving dexamethasone 12 mg weekly for diarrhea  Receiving MT with daratumumab with pomalyst and dex, most recent dose 10/16/21, has since been on hold due to worsening diarrhea.      DATA:   09/07/2010: IgG Kappa MM  12/02/2021: Ig M <5, IgG 447, IgA 20, Ig E <2, SFLC Kappa 16.6, Lambda 1.6, Kappa/Lamda 10.38  12/14/21: IgG K on IFE, IgG 430, KLC 16.6, LLC 2.7, KLR 6.15    Chemo:   Holding Daratumumab and Pomalyst, last dose 10/20/21, given recent infection and acute liver failure.    Supportive Care:   Transfusion: Keep Hg >7, and Platelets >10.   Per daughter, has autoantibodies in blood was a difficult match. Would like to be contacted prior to transfusion.   Line: PICC placed 1/26  Outpatient Oncologist: Dr. Kennon Rounds  Dispo: TBD, after resolution of sepsis and liver failure    # Immunocompromised  #CMV colitis  # At risk for infections due to malignancy  # Shock, likely hypovolemic vs distributive in the setting of chronic diarrhea, + norovirus infection   # Acute on chronic diarrhea, norovirus positive., diff and OP negative.   -No prior evidence of adrenal insufficiency, AM cortisol levels normal, previously on dexamethasone 8 mg PO Qweekly, but has been held since early December. CMV PCR on 1/    Data:  - Ucx: pending  - Bcx: pending  - CT brain: No acute intracranial hemorrhage, herniation, or hydrocephalus.  - CT chest/A/P pending: Medium-sized bilateral pleural effusions with bilateral peribronchovascular groundglass opacities, suggestive of pulmonary edema. Clustered nodules in right middle lobe along the right minor fissure, which could represent mild aspiration or infection. Numerous sclerotic and mixed lytic/sclerotic lesions throughout the thoracic spine and bilateral ribs, increased in size and number compared to prior. Appearance is atypical for multiple myeloma but may represent a combination of treated, partially treated, and active disease. No acute pathologic fracture. Findings compatible with enterocolitis of infectious or inflammatory etiology. Involvement of the colon is greater than of the small bowel. Marked hepatic steatosis. Increased sclerosis of bone lesions  throughout the thoracolumbar spine, bony pelvis, and proximal left femur. Appearance is atypical for multiple myeloma but may represent a combination of treated, partially treated, and active disease. No acute pathologic fracture.  - 1/29:CMV PCR [id number]  - 1/31: GI PCR panel pending [ ]   2/1: LP results: Neutrophils 6% in tube 1, Lymphocytes 88% (predominant), protein 42 (normal), glucose 36 (low), rapid HSV in CSF negative. CMV CSF PCR negative   - MRI brain pending [ ]    - 2/2 CMV PCR 179,555High   - 2/2 Flex Sig Biopsies pending [ ]   - 2/5 [place] work up: sIL2, ferritin, TG, NKA pending [ ]     Plan:  -  appreciate ID reccs   - Ganciclovir 5 mg/kg (1/31-)    - GI consult: appreciate reccs: flex sig 2/2    - stop zosyn: s/p IV Zosyn 4.5 g Q8hrs (1/26-1/28, restarted 1/29 given recurrent fever-12/16/21)  - checking weekly CMV, next 2/8 [ ]   - OI ppx: Acyclovir IV    # Grade III neutropenia, at baseline  # Leukopenia  #Thrombocytopenia (possibly 2/2 liver disease)  Data: WBC of 2.2 on 12/05/21, on Zarxio at home  Plan: Continue to monitor    # Acute liver failure of unclear etiology  # Acute on chronic encephalopathy likely 2/2 infectious vs hepatic vs metabolic  # Hyperbilirubinemia  # Anasarca  # Hypoalbuminemia    Data:   -Steady rise in LFTs in 05/2021,  -12/02/21: AST 152--> 121, ALT 130--> 110, TB 5.4, Albumin 1.1  -Hepatitis panel negative.   -1/26:  Tbili 14.6, GGT 1160, ammonia 25  -CTAP 12/02/21: Scattered sclerotic lesions in the lumbar spine, hepatic steatosis.   -RUQ ultrasound: liver enlarged with diffuse increased echogenicity, consistent with fatty infiltration. 2cm stone in the gallbladder.   -HIDA scan: Normal radiotracer filling of the gallbladder is identified, therefore the exam is not consistent with cystic duct obstruction or acute cholecystitis.  - [place] abd 1/27: Hepatomegaly with diffusely increased echogenicity which can be seen with hepatic amyloidosis or steatosis. Please refer to the  recently performed biopsy for further details. Mild perihepatic ascites. Patent hepatic vasculature with normal waveforms. Cholelithiasis without acute cholecystitis. Right pleural effusion.  Portal pressures:  Mean right atrial pressure (mmHg): 4  Mean inferior vena cava pressure (mmHg): 5  Mean free hepatic vein pressure (mmHg): 8   Mean wedged hepatic vein pressure (mmHg): 16   - 1/27 Liver Biopsy: Preliminary results showed extensive ballooning and parenchymal steatosis occupying majority of liver parenchyma. There was no evidence of significant inflammation, cholestasis or infiltration.   - 1/30: Serum studies: Ferritin 2380, iron serum 60, transferrin 46 (low), 93 % sat (high), HEV IgM negative, PCR forVZV negative, HSV negative,CMV+ 443,961High,EBV negative, AMA negative, Hepatitis B core Ab, surface Ab negative, Hep A IgG +/IgM negative   IgG4 pending [ ]     Plan:   - Appreciate Hepatology consult:    Per hepatology: "Based on history, there is no history of heavy alcohol use. While she has some risk factors for NAFLD, the  extent of hepatic steatosis is beyond natural history of NASH. At this juncture, the most likely DDx is secondary causes of rapidly  progressive hepatic steatosis...history of steroid use is the most likely scenario in her case."   - Recommendations:    - Consider avoiding steroid    - Daily LFT/INR    - RD consult [ ] placed  - final path for Liver bx 1/27 pending [ ]     #AMS  In the setting of liver failure, ammonia only 25. LP performed 2/1 to r/o encephalitis.     Data:   1/31: HIV, TSH, Folate, B12 WNL  1/30: CT head WNL other than sinus disease  2/1: LP results: Neutrophils 6% in tube 1, Lymphocytes 88% (predominant), protein 42 (normal), glucose 36 (low), rapid HSV in CSF negative. CMV CSF PCR negative  Brain MRI pending [ ]     Plan:  - given CMV elevation, c/f CMV encephalitis, ID c/s to discuss need for further work up/changes to therapy [ ]   - consider  neurology c/s    # History of PE,  diagnosed in April 2019  - hold home [place] for now given recent anemia and concerns for bleeding.   - monitor for signs and symptoms of worsening bleeding.     # Elevated TSH at OSH, likely 2/2 subclinical hypothyroidism  -TSH WNL on 12/15/20.      # Anxiety, emotional distress:   -Patient has stressful social situation, intellectually disabled son, and regarding underlying disease process.     #Chronic buttocks pressure wound  -Wound care consult, appreciate recs  -Position changes as tolerated, waffle cushion, etc    #VTE PPx:  Contraindicated: Anticipated procedure    Severity of Illness     During this hospitalization the patient is also being treated for:  - Sepsis (present on admission)    - Malignancy associated fatigue on admission  - Neoplasm/malignancy associated pain  - Hypoalbuminemia (present on admission)  - Fluid status: Hypovolemia on admission     - Cachexia  - Encephalopathy: Metabolic encephalopathy   --Pressure wound ruled out, treating bilateral buttocks moisture and friction damage     Code Status: FULL    Farrel Conners, MD  12/20/21

## 2021-12-21 LAB — COMPLETE BLOOD COUNT WITH DIFFERENTIAL
Abs Basophils: 0.01 10*9/L (ref 0.00–0.10)
Abs Eosinophils: 0 10*9/L (ref 0.00–0.40)
Abs Imm Granulocytes: 0.05 10*9/L (ref <0.10)
Abs Lymphocytes: 0.28 10*9/L — ABNORMAL LOW (ref 1.00–3.40)
Abs Monocytes: 0.08 10*9/L — ABNORMAL LOW (ref 0.20–0.80)
Abs Neutrophils: 2.31 10*9/L (ref 1.80–6.80)
Hematocrit: 28.4 % — ABNORMAL LOW (ref 36.0–46.0)
Hemoglobin: 9.6 g/dL — ABNORMAL LOW (ref 12.0–15.5)
MCH: 33.4 pg (ref 26.0–34.0)
MCHC: 33.8 g/dL (ref 31.0–36.0)
MCV: 99 fL (ref 80–100)
MPV: 13.4 fL — ABNORMAL HIGH (ref 9.1–12.6)
Platelet Count: 87 10*9/L — ABNORMAL LOW (ref 140–450)
RBC Count: 2.87 10*12/L — ABNORMAL LOW (ref 4.00–5.20)
RDW-CV: 24.1 % — ABNORMAL HIGH (ref 11.7–14.4)
WBC Count: 2.7 10*9/L — ABNORMAL LOW (ref 3.4–10.0)

## 2021-12-21 LAB — GI BACTERIA PANEL PCR
Campylobacter: NOT DETECTED
Comments: NOT DETECTED
E. coli O157: NOT DETECTED
ETEC LT/ST: NOT DETECTED
STEC stx1/stx2: NOT DETECTED
Salmonella: NOT DETECTED
Shigella: NOT DETECTED
Vibrio cholera: NOT DETECTED

## 2021-12-21 LAB — GI PARASITE PANEL PCR
Comments: NOT DETECTED
Cryptosporidium: NOT DETECTED
Entamoeba histolytica: NOT DETECTED
Giardia: NOT DETECTED

## 2021-12-21 LAB — COMPREHENSIVE METABOLIC PANEL (BMP, AST, ALT, T.BILI, ALKP, TP ALB)
AST: 76 U/L — ABNORMAL HIGH (ref 5–44)
Alanine transaminase: 66 U/L — ABNORMAL HIGH (ref 10–61)
Albumin, Serum / Plasma: 1.3 g/dL — ABNORMAL LOW (ref 3.4–4.8)
Alkaline Phosphatase: 560 U/L — ABNORMAL HIGH (ref 38–108)
Anion Gap: 6 (ref 4–14)
Bilirubin, Total: 16.1 mg/dL — ABNORMAL HIGH (ref 0.2–1.2)
Calcium, total, Serum / Plasma: 7.8 mg/dL — ABNORMAL LOW (ref 8.4–10.5)
Carbon Dioxide, Total: 24 mmol/L (ref 22–29)
Chloride, Serum / Plasma: 107 mmol/L (ref 101–110)
Creatinine: 0.31 mg/dL — ABNORMAL LOW (ref 0.55–1.02)
Glucose, non-fasting: 80 mg/dL (ref 70–199)
Potassium, Serum / Plasma: 3.1 mmol/L — ABNORMAL LOW (ref 3.5–5.0)
Protein, Total, Serum / Plasma: 3.1 g/dL — ABNORMAL LOW (ref 6.3–8.6)
Sodium, Serum / Plasma: 137 mmol/L (ref 135–145)
Urea Nitrogen, Serum / Plasma: 3 mg/dL — ABNORMAL LOW (ref 7–25)
eGFRcr: 120 mL/min/{1.73_m2} (ref >59)

## 2021-12-21 LAB — GI VIRAL PANEL PCR
Adenovirus 40/41: NOT DETECTED
Comments: NOT DETECTED
Norovirus GI/GII: DETECTED — AB
Rotavirus A: NOT DETECTED

## 2021-12-21 LAB — POCT GLUCOSE
Glucose, Glucometer: 89 mg/dL (ref 70–199)
Glucose, Glucometer: 65 mg/dL — ABNORMAL LOW (ref 70–199)
Glucose, Glucometer: 151 mg/dL (ref 70–199)

## 2021-12-21 LAB — PROTHROMBIN TIME
INR: 1.3 — ABNORMAL HIGH (ref 0.9–1.2)
PT: 15.8 s — ABNORMAL HIGH (ref 11.6–15.0)

## 2021-12-21 LAB — FIBRINOGEN, FUNCTIONAL: Fibrinogen, Functional: 163 mg/dL — ABNORMAL LOW (ref 202–430)

## 2021-12-21 MED ORDER — HYDROMORPHONE (PF) 0.5 MG/0.5 ML INJECTION SYRINGE
0.5 | Freq: Once | INTRAMUSCULAR | Status: DC
Start: 2021-12-21 — End: 2021-12-21

## 2021-12-21 MED ORDER — HYDROMORPHONE (PF) 0.5 MG/0.5 ML INJECTION SYRINGE
0.5 | Freq: Once | INTRAMUSCULAR | Status: AC
Start: 2021-12-21 — End: 2021-12-21
  Administered 2021-12-22: 05:00:00 via INTRAVENOUS

## 2021-12-21 MED FILL — DEXTROSE 50 % IN WATER (D50W) INTRAVENOUS SYRINGE: INTRAVENOUS | Qty: 50

## 2021-12-21 MED FILL — URSODIOL 300 MG CAPSULE: 300 mg | ORAL | Qty: 1

## 2021-12-21 MED FILL — DILAUDID (PF) 0.5 MG/0.5 ML INJECTION SYRINGE: 0.5 mg/ mL | INTRAMUSCULAR | Qty: 0.5

## 2021-12-21 MED FILL — POTASSIUM CHLORIDE 40 MEQ/100ML IN STERILE WATER INTRAVENOUS PIGGYBACK: 40 mEq/100 mL | INTRAVENOUS | Qty: 100

## 2021-12-21 MED FILL — GANCICLOVIR SODIUM 500 MG INTRAVENOUS SOLUTION: 500 mg | INTRAVENOUS | Qty: 8

## 2021-12-21 MED FILL — POTASSIUM CHLORIDE 20 MEQ/50 ML IN STERILE WATER INTRAVENOUS PIGGYBACK: 20 mEq/50 mL | INTRAVENOUS | Qty: 50

## 2021-12-21 NOTE — Consults (Signed)
INFECTIOUS DISEASES FOLLOW UP CONSULT NOTE  ATTENDING ONLY     My date of service is 12/21/2021.    Interim History since previous Infectious Disease note  - Afebrile since 1/28  - WBC 2.7 (ANC 2310), LFTs stable with ALT 66 AST 76, ALP 560, Tbili 16.1  - MRI Brain without acute findings - diffuse dural thickening likely related LP    Subjective  - In discomfort, but unable to answer questions about localization    Medications  Scheduled Meds:   0.9% sodium chloride flush  10-20 mL Intravenous Q12H [place]    0.9% sodium chloride flush  3 mL Intravenous Q12H [place]    dry mouth oral rinse  5 mL Mucous Membrane 4x Daily [place]    ganciclovir (CYTOVENE) IVPB  5 mg/kg (Dosing Weight) Intravenous Q12H [place]    ursodioL  300 mg Oral BID [place]     Continuous Infusions:   dextrose 5 % and 0.9 % sodium chloride 75 mL/hr (12/21/21 0547)       Antibiotic History  Current  Ganciclovir 1/31-    Prior  Pip-tazo 1/18-1/22, 1/24-1/27, 1/29-1/31  Micafungin 1/31-2/2  CTX 1/19  Vanc 1/18-1/19    Ppx:  Levofloxacin    Vitals  Temp:  [36.2 C (97.2 F)-37.4 C (99.3 F)] 36.2 C (97.2 F)  Heart Rate:  [71-84] 80  *Resp:  [18-20] 20  BP: (104-126)/(70-81) 104/70  SpO2:  [95 %-97 %] 97 %      Intake/Output Summary (Last 24 hours) at 12/21/2021 1121  Last data filed at 12/21/2021 0547  Gross per 24 hour   Intake 3369 ml   Output 2800 ml   Net 569 ml       Physical Examination  Constitutional: In bed, eyes open  HENT: NCAT.  MMM  Eyes: Scleral icterus, EOM normal  Neck: Neck supple.  Cardiovascular: Normal rate, regular rhythm, S1/S2 normal.  No murmurs or friction rub.  Pulmonary/Chest: No respiratory distress. CTAB.  No wheezes or rhonchi.   GI: Soft. +BS.  No distension, tenderness, or rebound. No organomegaly.  Extremities: No clubbing, cyanosis, or edema.  Neurological: A&Ox3 (name, Day, "23"). Answers "yes/no" if asked to examine. Unable to answer questions to localize pain.   Skin:Jaundiced No rashes, lesions, or peripheral stigmata of  endocarditis.   Psychiatric: Normal mood and affect.     Lines/tubes: LUE PICC    Data    CBC        12/21/21  0403 12/20/21  0341 12/19/21  0347   WBC 2.7* 2.6* 2.0*   HGB 9.6* 9.4* 9.5*   HCT 28.4* 27.4* 27.9*   PLT 87* 83* 76*   NEUTA 2.31 2.12 1.70*   LYMA 0.28* 0.28* 0.21*   MOA 0.08* 0.09* 0.07*   EOA 0.00 0.01 0.00   BASOA 0.01 0.01 0.00       Chem7        12/21/21  0403 12/20/21  0341 12/19/21  0347   NA 137 137 136   K 3.1* 3.0* 3.1*   CL 107 109 109   CO2 24 21* 21*   BUN 3* 3* 4*   CREAT 0.31* 0.29* 0.29*   GLU 80 78 85       Liver Panel        12/21/21  0403 12/20/21  0341 12/19/21  0347 12/11/21  0328 12/10/21  0208   AST 76* 70* 68*   < > 101*   ALT 66* 64* 66*   < >  116*   ALKP 560* 549* 566*   < > 745*   TBILI 16.1* 15.0* 15.2*   < > 14.6*   TP 3.1* 2.9* 2.9*   < > 3.2*   ALB 1.3* 1.3* 1.3*   < > 2.0*   GGT  --   --   --   --  1,160*    < > = values in this interval not displayed.       CRP        12/13/21  0302   CRP 11.3*       Microbiology  [place]  2/1 CSF   0 WBCs, normal protein, glucose low at 36.  HSV PCR neg  CMV PCR neg  2/1 C diff neg  2/1 GI PCR + norovirus  2/1 CMV PCR 179,555   1/30 UCx >100K C albicans  1/29 UCx >100K C albicans  1/29 cBCx x2 NG  1/29 CMV PCR 440,102  1/29 Hep E IgM neg  1/29 HBeAb neg  1/28 VZV PCR neg  1/28 HSV PCR neg  1/27 HAV IgG positive  1/27 EBV PCR neg  1/27 HBsAg neg, sAb 12  1/26 pBCx x2 NG  1/26 MRSA swab neg  1/26 C19 PCR neg  1/26 UCx >100K C [place]    OSH  1/18 GI PCR: +norovirus  1/18: flu, RSV, covid neg  1/22: CMV PCR positive  1/22: EBV PCR neg  1/24: blood cx x2: neg  1/25: VZV PCR: neg    Radiology Results   MR Brain with and without Contrast    Result Date: 12/20/2021  1.  No acute intracranial abnormality, specifically no acute infarct or hemorrhage. 2.  Smooth diffuse dural thickening and enhancement as well as bilateral holohemispheric subdural effusions, which may be on the basis of diminished intracranial pressure following recent lumbar  puncture. Report dictated by: Blair Dolphin, MD, signed by: Bluford Main, MD Department of Radiology and Biomedical Imaging    Pathology  12/17/21 Flex sig path: pending    12/11/21 Liver biopsy:  FINAL PATHOLOGIC DIAGNOSIS  Liver, transjugular biopsy:  1.  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  2.  Ductular reaction with focal pericholangitis; see comment.  COMMENT:  The biopsy shows severe steatosis with pericentral/sinusoidal and  periportal fibrosis, histiocytic and neutrophilic lobular inflammation,  Mallory hyaline as well as rare ballooned hepatocytes. 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  Clinical correlation with steatohepatitis risk factors is suggested,  including for any history of massive weight loss which may lead to an  aggressive onset of steatohepatitis [1].  The history of dexamethasone  use is noted; corticosteroid use has been associated with exacerbation  of pre-existing fatty liver disease [2].  Additionally, there is ductular reaction with focal pericholangitis,  raising consideration for a duct obstructive process. Clinical  correlation is advised. The concern for other drug-induced liver injury  is noted. In particular, thalidomide derivatives (such as pomalidomide)  have been reported in association with vanishing bile duct syndrome  (VBDS) [3]; however, there is no convincing duct loss to suggest VBDS.  There are no clusters of plasma cells seen to suggest involvement by  myeloma.  Given the reported high plasma CMV levels, immunohistochemistry for CMV will be performed and reported via addendum.    Assessment and Recommendations  60W with MM s/p mel-auto 2012 on dara / pom / dex with subacute weakness and GI illness (abdominal pain, diarrhea) and  encephalopathy since 07/2021 admitted 12/02/21 to [facility] for norovirus c/b hypovolemic shock transferred to [facility] 12/09/21. 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 On ganciclovir     # Encephalopathy: LP without pleocytosis and negative CMV PCR; MRI Brain without acute findings.     # Diarrhea with CT + enterocolitis: Ddx includes norovirus, CMV, other. Awaiting pathology from 2/2 flex sig    # Elevated LFTs: 1/27 liver biopsy + steatosis, CMV staining pending. Per Hepatology consider etiologies for rapidly developing steatosis    Dx  - Follow-up additional liver biopsy results including CMV staining  - Consider Neurology evaluation  Rx  - Continue IV ganciclovir 5 mg/kg q12    We will continue to follow with you.  Please contact Consult ID Transplant GOLD 1st Call via Voalte with questions.    Murlean Hark, MD  12/21/2021

## 2021-12-21 NOTE — Progress Notes (Signed)
[facility] PROGRESS NOTE  ATTENDING ONLY     My date of service is 12/21/21.    24 Hour Course  - Path pending on colonic biopsies  - Required extra doses of Dilaudid and Ativan overnight.    Subjective  Patient is not able to convey what is bothering her and causing discomfort. Otherwise seems oriented. Lacks insight. Tearful today.     Vitals  Temp:  [36.2 C (97.2 F)-36.8 C (98.2 F)] 36.4 C (97.5 F)  Heart Rate:  [71-85] 85  *Resp:  [18-20] 18  BP: (104-126)/(70-78) 109/76  SpO2:  [95 %-100 %] 97 %    MostRecent Weight: 91.4 kg (201 lb 8 oz)  Admission Weight: 79.6 kg (175 lb 7.8 oz)      Intake/Output Summary (Last 24 hours) at 12/21/2021 1829  Last data filed at 12/21/2021 1339  Gross per 24 hour   Intake 1169 ml   Output 3600 ml   Net -2431 ml       Pain Score: Eyes closed, patient calm    Physical Exam  Appearance: She is ill-appearing.      Comments: Alert and oriented X 1-2 slow to respond. Confused.    HENT:      Head: Normocephalic and atraumatic.      Nose: Nose normal.   Neck:  R IJ CVC with dressing in place, c/d/i  Eyes:      General: Scleral icterus present.      Extraocular Movements: Extraocular movements intact.      Conjunctiva/sclera: Conjunctivae normal.      Pupils: Pupils are equal, round, and reactive to light.   Cardiovascular:      Rate and Rhythm: Normal rate and regular rhythm.      Pulses: Normal pulses.      Heart sounds: Normal heart sounds. No murmur heard.    No friction rub. No gallop.   Pulmonary:      Effort: Pulmonary effort is normal. No respiratory distress.      Breath sounds: Normal breath sounds. No stridor. No wheezing, rhonchi or rales.   Abdominal:      General: Abdomen is flat. Bowel sounds are normal. There is no distension.      Palpations: Abdomen is soft. There is no mass.      Tenderness: There is no abdominal tenderness. There is no guarding or rebound.      Hernia: No hernia is present.      Comments: Foley catheter in place, urine appears  dark  No suprapubic tenderness.   Musculoskeletal:         General: Normal range of motion.      Cervical back: Normal range of motion and neck supple.      Right lower leg: Edema present.      Left lower leg: Edema present.      Comments: Strength 1/5 in the bilateral lower extremities, unable to raise them to gravity.   Pitting edema 3-4+ in the bilateral lower extremities.   Anasarca in the abdomen and thighs bilaterally.    Skin:     General: Skin is warm.      Capillary Refill: Capillary refill takes less than 2 seconds.      Coloration: Skin is jaundiced.      Findings: Bruising present.      Comments: ecchymoses on the sternum   Neurological:      Mental Status: She is alert. She is disoriented.      Motor:  Weakness present.       Scheduled Meds:   0.9% sodium chloride flush  10-20 mL Intravenous Q12H [place]    0.9% sodium chloride flush  3 mL Intravenous Q12H [place]    dry mouth oral rinse  5 mL Mucous Membrane 4x Daily [place]    ganciclovir (CYTOVENE) IVPB  5 mg/kg (Dosing Weight) Intravenous Q12H [place]    ursodioL  300 mg Oral BID [place]     Continuous Infusions:   dextrose 5 % and 0.9 % sodium chloride 75 mL/hr (12/21/21 1729)     PRN Meds:   0.9% sodium chloride flush  10-20 mL Intravenous PRN    0.9% sodium chloride flush  3 mL Intravenous PRN    dextrose 50%  12.5 g Intravenous Q15 Min PRN    Or    glucose  20 g Oral Q15 Min PRN    Or    juice (for hypoglycemia)  16 g Oral Q15 Min PRN    HYDROmorphone  0.3-0.8 mg Intravenous Q4H PRN    LORazepam  0.5 mg Oral Once PRN    magnesium sulfate in dextrose 5 %  2-4 g Intravenous Daily PRN    Or    magnesium sulfate in water  2-4 g Intravenous Daily PRN    metoclopramide  5-10 mg Intravenous Q8H PRN    ondansetron  8 mg Intravenous Q8H PRN    polyethylene glycol  17 g Oral Daily PRN    potassium chloride in sterile water  20-80 mEq Intravenous Daily PRN    Or    potassium chloride in sterile water  20-80 mEq Intravenous Daily PRN    Or    potassium  chloride  20-80 mEq Oral Daily PRN       Data    CBC        12/21/21  0403   WBC 2.7*   HGB 9.6*   HCT 28.4*   PLT 87*     Coags        12/21/21  0403   INR 1.3*     Chem7        12/21/21  0403   NA 137   K 3.1*   CL 107   CO2 24   BUN 3*   CREAT 0.31*   GLU 80     Electrolytes        12/21/21  0403   CA 7.8*     Liver Panel        12/21/21  0403   AST 76*   ALT 66*   ALKP 560*   TBILI 16.1*   TP 3.1*   ALB 1.3*     Lactate  No results found in last 36 hours    Microbiology Results (last 24 hours)     Procedure Component Value Units Date/Time    GI Parasite Panel PCR [[id number]] Collected: 12/16/21 0945    Order Status: Completed Specimen: Stool in [place] Updated: 12/21/21 1533     Comments ADD ON TEST     Cryptosporidium Not detected     Entamoeba histolytica Not detected     Giardia Not detected     Comments Reference value for all analytes: Not Detected.    GI Viral Panel PCR [[id number]]  (Abnormal) Collected: 12/16/21 0945    Order Status: Completed Specimen: Stool in [place] Updated: 12/21/21 1533     Comments ADD ON TEST     Adenovirus 40/41 Not detected  Norovirus GI/GII DETECTED     Rotavirus A Not detected     Comments Reference value for all analytes: Not Detected.    GI Bacteria Panel PCR [[id number]] Collected: 12/16/21 0945    Order Status: Completed Specimen: Stool in [place] Updated: 12/21/21 1533     Comments ADD ON TEST     Campylobacter Not detected     E. coli O157 Not detected     ETEC LT/ST Not detected     Salmonella Not detected     Shigella Not detected     STEC stx1/stx2 Not detected     Vibrio cholera Not detected     Comments Reference value for all analytes: Not Detected.          Radiology Results  No results found.     I discussed with Dr. Bonnita Nasuti from [facility] regarding A&P.    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 The patient has received VRd X 4 (achieving VGPR) then Mel 200 ASCT 05/12/11. The patient had PD 03/2014 and  was started on single-agent revelmid as a second line therapy. She met the criteria for PD on 02/19/2020 and was started on third line DARA PD in June 2021, which was recently held on 10/20/21 due to chronic diarrhea. Patient was transferred OSH after recent hypovolemic/distributive shock. Here for acute encephalopathy workup and acute liver failure workup.      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   June 2020 due to progression of M spike Revlimid was increased to 15 mg PO D1-21 every 28 days, plus dexamethasone  July 2021: started daratumumab, Pomylast 2 mg D 1-21, Q28 days, dexamethasone 12 mg PO Q weekly.   Primary oncologist: Dr. Iona Coach,  Dr. Kennon Rounds [facility]  Receiving dexamethasone 12 mg weekly for diarrhea  Receiving MT with daratumumab with pomalyst and dex, most recent dose 10/16/21, has since been on hold due to worsening diarrhea.     DATA:   09/07/2010: IgG Kappa MM  12/02/2021: Ig M <5, IgG 447, IgA 20, Ig E <2, SFLC Kappa 16.6, Lambda 1.6, Kappa/Lamda 10.38  12/14/21: IgG K on IFE, IgG 430, KLC 16.6, LLC 2.7, KLR 6.15    Chemo:   Holding Daratumumab and Pomalyst, last dose 10/20/21, given recent infection and acute liver failure.    Supportive Care:   Transfusion: Keep Hg >7, and Platelets >10.   Per daughter, has autoantibodies in blood was a difficult match. Would like to be contacted prior to transfusion.   Line: PICC placed 1/26  Outpatient Oncologist: Dr. Kennon Rounds  Dispo: TBD, after resolution of sepsis and liver failure    # Immunocompromised  #CMV colitis  # At risk for infections due to malignancy  # Shock, likely hypovolemic vs distributive in the setting of chronic diarrhea, + norovirus infection   # Acute on chronic diarrhea, norovirus positive., diff and OP negative.   -No  prior evidence of adrenal insufficiency, AM cortisol levels normal, previously on dexamethasone 8 mg PO Qweekly, but has been held since early December. CMV PCR on 1/    Data:  - Ucx: pending  - Bcx: pending  - CT brain: No acute intracranial hemorrhage, herniation, or hydrocephalus.  -  CT chest/A/P pending: Medium-sized bilateral pleural effusions with bilateral peribronchovascular groundglass opacities, suggestive of pulmonary edema. Clustered nodules in right middle lobe along the right minor fissure, which could represent mild aspiration or infection. Numerous sclerotic and mixed lytic/sclerotic lesions throughout the thoracic spine and bilateral ribs, increased in size and number compared to prior. Appearance is atypical for multiple myeloma but may represent a combination of treated, partially treated, and active disease. No acute pathologic fracture. Findings compatible with enterocolitis of infectious or inflammatory etiology. Involvement of the colon is greater than of the small bowel. Marked hepatic steatosis. Increased sclerosis of bone lesions throughout the thoracolumbar spine, bony pelvis, and proximal left femur. Appearance is atypical for multiple myeloma but may represent a combination of treated, partially treated, and active disease. No acute pathologic fracture.  - 1/29:CMV PCR [id number]  - 1/31: GI PCR panel pending [ ]    2/1: LP results: Neutrophils 6% in tube 1, Lymphocytes 88% (predominant), protein 42 (normal), glucose 36 (low), rapid HSV in CSF negative. CMV CSF PCR negative    - 2/2 CMV PCR 179,555High   - 2/2 Flex Sig Biopsies pending [ ]    - 2/5 HLH work up: sIL2, ferritin (948 high), TG(258 high), NKA pending [ ]      Plan:  - appreciate ID reccs   - Ganciclovir 5 mg/kg (1/31-)    - GI consult: appreciate reccs: flex sig 2/2    - stop zosyn: s/p IV Zosyn 4.5 g Q8hrs (1/26-1/28, restarted 1/29 given recurrent fever-12/16/21)  - checking weekly CMV, next 2/8 [ ]    - OI ppx: Acyclovir  IV    # Grade III neutropenia, at baseline  # Leukopenia  #Thrombocytopenia (possibly 2/2 liver disease)  Data: WBC of 2.2 on 12/05/21, on Zarxio at home  Plan: Continue to monitor    # Acute liver failure of unclear etiology  # Acute on chronic encephalopathy likely 2/2 infectious vs hepatic vs metabolic  # Hyperbilirubinemia  # Anasarca  # Hypoalbuminemia    Data:   -Steady rise in LFTs in 05/2021,  -12/02/21: AST 152--> 121, ALT 130--> 110, TB 5.4, Albumin 1.1  -Hepatitis panel negative.   -1/26:  Tbili 14.6, GGT 1160, ammonia 25  -CTAP 12/02/21: Scattered sclerotic lesions in the lumbar spine, hepatic steatosis.   -RUQ ultrasound: liver enlarged with diffuse increased echogenicity, consistent with fatty infiltration. 2cm stone in the gallbladder.   -HIDA scan: Normal radiotracer filling of the gallbladder is identified, therefore the exam is not consistent with cystic duct obstruction or acute cholecystitis.  - [place] abd 1/27: Hepatomegaly with diffusely increased echogenicity which can be seen with hepatic amyloidosis or steatosis. Please refer to the recently performed biopsy for further details. Mild perihepatic ascites. Patent hepatic vasculature with normal waveforms. Cholelithiasis without acute cholecystitis. Right pleural effusion.  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 CMV stains: Pending.  - 1/30: Serum studies: Ferritin 2380, iron serum 60, transferrin 46 (low), 93 % sat (high), HEV IgM negative, PCR forVZV negative, HSV negative,CMV+ 443,961High,EBV negative, AMA negative, Hepatitis B core Ab, surface Ab negative, Hep A IgG +/IgM negative   IgG4 pending  [ ]      Plan:   -  Appreciate Hepatology consult:    Per hepatology: "Based on history, there is no history of heavy alcohol use. While she has some risk factors for NAFLD, the  extent of hepatic steatosis is beyond natural history of NASH. At this juncture, the most likely DDx is secondary causes of rapidly  progressive hepatic steatosis...history of steroid use is the most likely scenario in her case."   - Recommendations:    - Consider avoiding steroid    - Daily LFT/INR    - RD consult placed. Family requesting appetite stimulant.  - CMV stain on liver biopsy: Pending.    #AMS  In the setting of liver failure, ammonia only 25. LP performed 2/1 to r/o encephalitis.     Data:   1/31: HIV, TSH, Folate, B12 WNL  1/30: CT head WNL other than sinus disease  2/1: LP results: Neutrophils 6% in tube 1, Lymphocytes 88% (predominant), protein 42 (normal), glucose 36 (low), rapid HSV in CSF negative. CMV CSF PCR negative  2/5: Brain MRI: Smooth diffuse dural thickening and enhancement as well as bilateral holohemispheric subdural effusions, which may be on the basis of diminished intracranial pressure following recent lumbar puncture.     Plan:  - given CMV elevation, c/f CMV encephalitis, ID c/s to discuss need for further work up/changes to therapy [ ]    - consider neurology c/s    # History of PE, diagnosed in April 2019  - hold home [place] for now given recent anemia and concerns for bleeding.   - monitor for signs and symptoms of worsening bleeding.     # Elevated TSH at OSH, likely 2/2 subclinical hypothyroidism  -TSH WNL on 12/15/20.      # Anxiety, emotional distress:   -Patient has stressful social situation, intellectually disabled son, and regarding underlying disease process.     #Chronic buttocks pressure wound  -Wound care consult, appreciate recs  -Position changes as tolerated, waffle cushion, etc    #VTE PPx:  Contraindicated: Anticipated procedure    Severity of Illness     During this  hospitalization the patient is also being treated for:  - Sepsis (present on admission)    - Malignancy associated fatigue on admission  - Neoplasm/malignancy associated pain  - Hypoalbuminemia (present on admission)  - Fluid status: Hypovolemia on admission     - Cachexia  - Encephalopathy: Metabolic encephalopathy   --Pressure wound ruled out, treating bilateral buttocks moisture and friction damage     Code Status: FULL    James Ivanoff, MD  12/21/21

## 2021-12-22 LAB — COMPLETE BLOOD COUNT WITH DIFFERENTIAL
Abs Basophils: 0.01 10*9/L (ref 0.00–0.10)
Abs Eosinophils: 0.01 10*9/L (ref 0.00–0.40)
Abs Imm Granulocytes: 0.1 10*9/L — ABNORMAL HIGH (ref <0.10)
Abs Lymphocytes: 0.24 10*9/L — ABNORMAL LOW (ref 1.00–3.40)
Abs Monocytes: 0.08 10*9/L — ABNORMAL LOW (ref 0.20–0.80)
Abs Neutrophils: 2.68 10*9/L (ref 1.80–6.80)
Hematocrit: 28.4 % — ABNORMAL LOW (ref 36.0–46.0)
Hemoglobin: 9.8 g/dL — ABNORMAL LOW (ref 12.0–15.5)
MCH: 34 pg (ref 26.0–34.0)
MCHC: 34.5 g/dL (ref 31.0–36.0)
MCV: 99 fL (ref 80–100)
MPV: 13.3 fL — ABNORMAL HIGH (ref 9.1–12.6)
Platelet Count: 89 10*9/L — ABNORMAL LOW (ref 140–450)
RBC Count: 2.88 10*12/L — ABNORMAL LOW (ref 4.00–5.20)
RDW-CV: 24.2 % — ABNORMAL HIGH (ref 11.7–14.4)
WBC Count: 3.1 10*9/L — ABNORMAL LOW (ref 3.4–10.0)

## 2021-12-22 LAB — COMPREHENSIVE METABOLIC PANEL (BMP, AST, ALT, T.BILI, ALKP, TP ALB)
AST: 74 U/L — ABNORMAL HIGH (ref 5–44)
Alanine transaminase: 64 U/L — ABNORMAL HIGH (ref 10–61)
Albumin, Serum / Plasma: 1.4 g/dL — ABNORMAL LOW (ref 3.4–4.8)
Alkaline Phosphatase: 573 U/L — ABNORMAL HIGH (ref 38–108)
Anion Gap: 7 (ref 4–14)
Bilirubin, Total: 16.8 mg/dL — ABNORMAL HIGH (ref 0.2–1.2)
Calcium, total, Serum / Plasma: 7.8 mg/dL — ABNORMAL LOW (ref 8.4–10.5)
Carbon Dioxide, Total: 25 mmol/L (ref 22–29)
Chloride, Serum / Plasma: 106 mmol/L (ref 101–110)
Creatinine: 0.26 mg/dL — ABNORMAL LOW (ref 0.55–1.02)
Glucose, non-fasting: 82 mg/dL (ref 70–199)
Potassium, Serum / Plasma: 2.7 mmol/L — CL (ref 3.5–5.0)
Protein, Total, Serum / Plasma: 3.2 g/dL — ABNORMAL LOW (ref 6.3–8.6)
Sodium, Serum / Plasma: 138 mmol/L (ref 135–145)
Urea Nitrogen, serum/plasma: 2 mg/dL — ABNORMAL LOW (ref 7–25)
eGFRcr: 125 mL/min/{1.73_m2} (ref >59)

## 2021-12-22 LAB — ECG 12-LEAD
Atrial Rate: 63 {beats}/min
Calculated P Axis: 45 degrees
Calculated R Axis: -10 degrees
Calculated T Axis: 115 degrees
P-R Interval: 136 ms
QRS Duration: 82 ms
QT Interval: 518 ms
QTcb: 530 ms
Ventricular Rate: 63 {beats}/min

## 2021-12-22 LAB — POCT GLUCOSE
Glucose, Glucometer: 79 mg/dL (ref 70–199)
Glucose, Glucometer: 94 mg/dL (ref 70–199)
Glucose, Glucometer: 85 mg/dL (ref 70–199)

## 2021-12-22 LAB — PROTHROMBIN TIME
INR: 1.3 — ABNORMAL HIGH (ref 0.9–1.2)
PT: 15.8 s — ABNORMAL HIGH (ref 11.6–15.0)

## 2021-12-22 LAB — FIBRINOGEN, FUNCTIONAL: Fibrinogen, Functional: 164 mg/dL — ABNORMAL LOW (ref 202–430)

## 2021-12-22 MED ORDER — IMMUNE GLOBU G 5 GRAM/50 ML(10 %)-GLY-IGA AVE 46 MCG/ML INJECTION SOLN
5 gram/50 mL (10 %) | INTRAMUSCULAR | Status: AC
  Administered 2021-12-23: via INTRAVENOUS

## 2021-12-22 MED ORDER — MAGNESIUM SULFATE 2 GRAM/50 ML (4 %) IN WATER INTRAVENOUS PIGGYBACK
2 | Freq: Once | INTRAVENOUS | Status: AC
Start: 2021-12-22 — End: 2021-12-22
  Administered 2021-12-23: 06:00:00 via INTRAVENOUS

## 2021-12-22 MED ORDER — POTASSIUM CHLORIDE 20 MEQ/50 ML IN STERILE WATER INTRAVENOUS PIGGYBACK
20 | Freq: Once | INTRAVENOUS | Status: AC
Start: 2021-12-22 — End: 2021-12-22
  Administered 2021-12-23: via INTRAVENOUS

## 2021-12-22 MED ORDER — POTASSIUM CHLORIDE 40 MEQ/100ML IN STERILE WATER INTRAVENOUS PIGGYBACK
40 | Freq: Once | INTRAVENOUS | Status: AC
Start: 2021-12-22 — End: 2021-12-22
  Administered 2021-12-22: 17:00:00 via INTRAVENOUS

## 2021-12-22 MED ORDER — PEDIATRIC MULTIVITAMIN WITH IRON AND OTHER MINERALS CHEWABLE TABLET
Freq: Every day | ORAL | Status: DC
Start: 2021-12-22 — End: 2022-01-15
  Administered 2021-12-23 – 2022-01-12 (×22): via ORAL
  Administered 2022-01-13: 18:00:00 1 via ORAL
  Administered 2022-01-14 – 2022-01-15 (×2): via ORAL

## 2021-12-22 MED ORDER — IMMUNE GLOB G 20 GRAM/200 ML(10%)-GLY-IGA AVE 46 MCG/ML INJECTION SOLN
2020010 gram/0 mL (10 %) | Freq: Once | INTRAMUSCULAR | Status: DC
Start: 2021-12-22 — End: 2021-12-22

## 2021-12-22 MED ORDER — MAGNESIUM SULFATE 2 GRAM/50 ML (4 %) IN WATER INTRAVENOUS PIGGYBACK
2 | Freq: Once | INTRAVENOUS | Status: AC
Start: 2021-12-22 — End: 2021-12-22
  Administered 2021-12-23: 05:00:00 via INTRAVENOUS

## 2021-12-22 MED ORDER — ALBUTEROL SULFATE 2.5 MG/3 ML (0.083 %) SOLUTION FOR NEBULIZATION
2.530.083 mg /3 mL (0.083 %) | RESPIRATORY_TRACT | Status: AC | PRN
Start: 2021-12-22 — End: 2022-01-15

## 2021-12-22 MED ORDER — HYDROCORTISONE SOD SUCCINATE (PF) 100 MG/2 ML SOLUTION FOR INJECTION
100 | Freq: Once | INTRAMUSCULAR | Status: DC | PRN
Start: 2021-12-22 — End: 2022-01-01

## 2021-12-22 MED ORDER — ALBUTEROL SULFATE CONCENTRATE 2.5 MG/0.5 ML SOLUTION FOR NEBULIZATION
RESPIRATORY_TRACT | Status: DC | PRN
Start: 2021-12-22 — End: 2022-01-15

## 2021-12-22 MED ORDER — POTASSIUM CHLORIDE 40 MEQ/100ML IN STERILE WATER INTRAVENOUS PIGGYBACK
40 | Freq: Once | INTRAVENOUS | Status: AC
Start: 2021-12-22 — End: 2021-12-22
  Administered 2021-12-22: 20:00:00 via INTRAVENOUS

## 2021-12-22 MED ORDER — IMMUNE GLOB G 20 GRAM/200 ML(10%)-GLY-IGA AVE 46 MCG/ML INJECTION SOLN
20 gram/200 mL (10 %) | INTRAMUSCULAR | Status: AC
  Administered 2021-12-23: 02:00:00 via INTRAVENOUS

## 2021-12-22 MED ORDER — EPINEPHRINE 1 MG/ML (1 ML) INJECTION SOLUTION
11 mg/mL ( mL) | Freq: Once | INTRAMUSCULAR | Status: AC | PRN
Start: 2021-12-22 — End: 2022-01-01

## 2021-12-22 MED ORDER — DIPHENHYDRAMINE 50 MG/ML INJECTION SOLUTION
50 | Freq: Once | INTRAMUSCULAR | Status: DC | PRN
Start: 2021-12-22 — End: 2022-01-01

## 2021-12-22 MED ORDER — DICYCLOMINE 10 MG CAPSULE
10 mg | Freq: Four times a day (QID) | ORAL | Status: DC
Start: 2021-12-22 — End: 2022-01-15
  Administered 2021-12-23 – 2022-01-15 (×90): via ORAL

## 2021-12-22 MED ORDER — POTASSIUM CHLORIDE 20 MEQ/50 ML IN STERILE WATER INTRAVENOUS PIGGYBACK
20 | Freq: Once | INTRAVENOUS | Status: AC
Start: 2021-12-22 — End: 2021-12-22
  Administered 2021-12-23: 05:00:00 via INTRAVENOUS

## 2021-12-22 MED FILL — DILAUDID (PF) 0.5 MG/0.5 ML INJECTION SYRINGE: 0.5 mg/ mL | INTRAMUSCULAR | Qty: 0.5

## 2021-12-22 MED FILL — ONDANSETRON HCL (PF) 4 MG/2 ML INJECTION SOLUTION: 4 mg/2 mL | INTRAMUSCULAR | Qty: 4

## 2021-12-22 MED FILL — POTASSIUM CHLORIDE 40 MEQ/100ML IN STERILE WATER INTRAVENOUS PIGGYBACK: 40 mEq/100 mL | INTRAVENOUS | Qty: 100

## 2021-12-22 MED FILL — GAMUNEX-C 20 GRAM/200 ML (10 %) INJECTION SOLUTION: 20 gram/0 mL (10 %) | INTRAMUSCULAR | Qty: 200

## 2021-12-22 MED FILL — URSODIOL 300 MG CAPSULE: 300 mg | ORAL | Qty: 1

## 2021-12-22 MED FILL — GANCICLOVIR SODIUM 500 MG INTRAVENOUS SOLUTION: 500 mg | INTRAVENOUS | Qty: 8

## 2021-12-22 MED FILL — GAMUNEX-C 5 GRAM/50 ML (10 %) INJECTION SOLUTION: 5 gram/0 mL (10 %) | INTRAMUSCULAR | Qty: 50

## 2021-12-22 NOTE — Consults (Signed)
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 The patient has received VRd X 4 (achieving VGPR) then Mel 200 ASCT 05/12/11. 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 She met the criteria for PD on 02/19/2020 and was started on third line DARA PD in June 2021, which was recently held on 10/20/21 due to chronic diarrhea. Patient was transferred OSH after recent hypovolemic/distributive shock. Here for acute encephalopathy workup and acute liver failure workup.    ASSESSMENT  Patient moaning throughout. States she is in pain while in bed but unable to state pain location. She required Dep x 2 assist to sit EOB, Max A x 2 to perform sit<>stand with STEDY with max verbal cues for hand placement and safety. She became hypotensive while working with OT while seated in chair, requiring use of maxi-move to dependently transfer back to bed. She will benefit from skilled PT and placement for continued therapy to decrease caregiver  burden with functional mobility.    Focus next session: upright tolerance, OOB, therex    RECOMMENDATIONS  DISCHARGE RECOMMENDATION  Comments Placement with ongoing PT needs  Pending medical stability.   Patient Current Functional Status Sufficient for PT Discharge Recommendation Yes   Discharge DME needs TBD pending patient progress.   Discharge transportation needs [facility] Potential Patient participates well in therapy and progressing towards goals     NURSING RECOMMENDATIONS  Inpatient Rehab Assistive Device Recommendation Vertical dependent lift;Ceiling lift   Activity Recommendation Bed in chair position, OOB to chair with maxi move     SUBJECTIVE  Subjective report:  Moaning throughout. "I want to sit."  Notable observations:  Supine in bed with HOB elevated at beginning of session. Seated in chair with sling underneth, safety belt closed to front. OT and RN in  room.    SYSTEMS REVIEW  Cognition/Communication  Delirium screen:  Yes Delirium Screen:   Details:  Encephalopathic    Communication  Cognition/communication impaired:  Yes     Orientation: Disoriented to place, Disoriented to time, Disoriented to event  Communication: Difficulty making basic needs known, Difficulty making complex needs known, Expressive deficits  Cognition: Decreased attention, Delayed response, Impaired motor planning, Decreased safety awareness, Decreased insight to deficits, Decreased command following  Cognition comment: Able to follow one step commands ~ 50% of the time  Behavior: Anxious  Behavior comment: Anxious with movement.    Integumentary  Integumentary deficits:  YesIntegumentary deficit detail:  Jaundiced, bilateral lower extremity pitting edema, wounds at buttocks, foley    Cardiopulmonary  Cardiopulmonary deficits:  Yes  Detail:  Decreased activity tolerance    Musculoskeletal  Musculoskeletal deficits:  Yes  Abnormal strength findings: Hip flexion 2+/5 bilaterally, knee extension 2+/5 bilaterally, ankle DF 2+/5 bilaterally    Neuromuscular  Neuromuscular deficits:  Yes  Motor control or coordination: Impaired at bilateral lower extremities >> bilateral upper extremities        Pain     Currently in pain: Yes  Pain location: States "yes" when asked if she's in pain. Unable to state location of pain.          COMPREHENSIVE MOVEMENT ANALYSIS/TREATMENT  Precautions/WB status: Yes  Precautions and weight bearing status comments: delrium, falls      Hemodynamic response:   Normal hemodynamic response: No  Response:  Orthostatic hypotension  Comments: BP 68/38 sitting in chair. Recovered to  90s/60s in supine ~ 10 min.      Functional Mobility  Requires second person/additional health care providers: Yes  Type additional health care provider: OT    Bed mobility Current Initial    Rolling  Level of assist  Maximal assist  Maximal assist (12/15/21 0932)   Device   Log roll (12/15/21 0932)    Intervention     Rolling comments:  rolling L and R for pericare and removal of hoyer sling. Incontinent of BM while seated in chair.    Bed mobility Current Initial    Supine < > Sit  Level of assist  Dependent assist;Two person assist  Dependent assist;Two person assist (12/15/21 0932)   Device   Through long sitting;Bed rail;Head of bed elevated (12/13/21 1430)   Intervention   Verbal cues;Tactile cues;External cues (12/13/21 1430)   Supine<>sit comments:   Dep x 2 pivot to EOB    Transfer Current Initial   Sit < > Stand  Level of assist  Maximal assist;Two person assist  Maximal assist;Two person assist (12/16/21 0930)   Device     Intervention     Transfer  Level of assist  From: Chair  To: Bed  Dependent assist;Two person assist  Dependent assist (12/18/21 2878)   Device   Vertical dependent lift (12/18/21 6767)   Intervention   Verbal cues;Tactile cues (12/18/21 0938)   Sit < > Stand comments:  max verbal cues for hand placement and safety. Max A x 2 to stand from elevated bed with STEDY, Max A to stand from flaps of STEDY  Transfer comments:   progressively hypotensive while seated in chair with OT. See below. Transferred bakc to bed via maxi-move    Balance  Balance deficits noted: Yes  Functional Balance for ADLs  Position Static Dynamic   Sit Static sitting level of assist: Contact guard assist-     Dynamic sitting level of assist: Maximal assist-  Dynamic sitting comment: scooting anteriorly     Stand Static Standing level of assist: Maximal assist-   Static standing comment: Max A at trunk with BUE support on STEDY  -        Exercise/other interventions  Supine exercises comment (other exercise, details): ankle pumps AAROM bilaterally, hip and knee flexion AAROM, bilateral shoulder flexion AROM    Communication between other health care providers: Communication between other health care provider: OT;RN  Communication comment:       Education Careers adviser: Patient  Content: Plan of care,  Activity recommendations  Response: Needs reinforcement  Outcome measures   Physical Therapist Global Assessment of Mobility  Activity Achieved: Passive ROM  AMPAC 6-clicks basic mobility score: 9      PLAN  Plan of care status:  Current plan of care remains appropriate  PT frequency:  5x/week  PT duration:  4 weeks.  Comment:  POC expires 2/26      Planned PT interventions:   Specific interventions: Progressive functional mobility training;Gait training;Balance training;NM Re-ed;Aerobic training;Ther ex  Education interventions: Caregiver training;Benefits of activity;Self-pacing/breathing;Exercise program;Mobility with precautions compliance;Fall risk reduction  Comment:          Leotis Shames, PT, DPT, NCS    12/22/2021

## 2021-12-22 NOTE — Consults (Addendum)
Hepatology/Liver Transplant Service Consultation Note     24 Hour Course  - LFT continue to rise in cholestatic pattern with ALT 64, AST 74, ALP 573, and total bilirubin 16.8    Subjective  Patient remains confused and is A&O x 1 this morning. Unable to provide any reliable history.     Objective  Current Facility-Administered Medications   Medication Dose Route Frequency Provider Last Rate Last Admin    0.9% sodium chloride flush injection syringe 10-20 mL  10-20 mL Intravenous Q12H [facility] Zena Amos, DO   10 mL at 12/22/21 0904    0.9% sodium chloride flush injection syringe 10-20 mL  10-20 mL Intravenous PRN Zena Amos, DO   10 mL at 12/19/21 2053    0.9% sodium chloride flush injection syringe 3 mL  3 mL Intravenous Q12H [facility] Carmine Savoy, MD   3 mL at 12/19/21 2054    0.9% sodium chloride flush injection syringe 3 mL  3 mL Intravenous PRN Carmine Savoy, MD        albuterol (PROVENTIL) 2.5 mg /3 mL (0.083 %) inhalation solution 2.5 mg  2.5 mg Nebulization Q4H PRN Vinodhini Arjunan, MD        Or    albuterol (PROVENTIL) inhalation solution 2.5 mg  2.5 mg Nebulization Q4H PRN Vinodhini Arjunan, MD        dextrose 5 % and 0.9 % sodium chloride infusion  75 mL/hr Intravenous Continuous Zena Amos, DO 75 mL/hr at 12/22/21 0759 75 mL/hr at 12/22/21 0759    dextrose 50% injection syringe 12.5 g  12.5 g Intravenous Q15 Min PRN Zena Amos, DO   12.5 g at 12/21/21 0940    Or    glucose chewable tablet 20 g  20 g Oral Q15 Min PRN Zena Amos, DO        Or    juice (for hypoglycemia) 16 g  16 g Oral Q15 Min PRN Zena Amos, DO        diphenhydrAMINE (BENADRYL) injection 50 mg  50 mg Intravenous Once PRN Vinodhini Arjunan, MD        dry mouth oral rinse (BIOTENE DRY MOUTH) solution 5 mL  5 mL Mucous Membrane 4x Daily [facility] Farrel Conners, MD   5 mL at 12/22/21 0906    EPINEPHrine (ADRENALIN) 1 mg/mL (1 mL) injection 0.3 mg  0.3 mg Intramuscular  Once PRN Vinodhini Arjunan, MD        ganciclovir (CYTOVENE) 400 mg in dextrose 5% 100 mL IVPB  5 mg/kg (Dosing Weight) Intravenous Q12H [facility] Farrel Conners, MD   Ended at 12/22/21 1030    hydrocortisone sodium succinate (SOLU-CORTEF) 100 mg/2 mL injection 100 mg  100 mg Intravenous Once PRN Vinodhini Arjunan, MD        HYDROmorphone (DILAUDID) injection syringe 0.3-0.8 mg  0.3-0.8 mg Intravenous Q4H PRN Deboraha Sprang, MD   0.5 mg at 12/22/21 0921    immune globulin (human) (GAMUNEX-C) injection 23 g  0.5 g/kg (Ideal) Intravenous Once Vinodhini Arjunan, MD        LORazepam (ATIVAN) tablet 0.5 mg  0.5 mg Oral Once PRN Sandria Manly, MD        magnesium sulfate in dextrose 5 % 1 g/100 mL IVPB 2-4 g  2-4 g Intravenous Daily PRN Bernestine Amass, MD        Or    magnesium sulfate in water 2 gram/50 mL (4 %) IVPB 2-4 g  2-4 g Intravenous Daily  PRN Bernestine Amass, MD 50 mL/hr at 12/17/21 0658 2 g at 12/17/21 0658    metoclopramide (REGLAN) injection 5-10 mg  5-10 mg Intravenous Q8H PRN Bernestine Amass, MD   10 mg at 12/12/21 2102    ondansetron (ZOFRAN) injection 8 mg  8 mg Intravenous Q8H PRN Zena Amos, DO   8 mg at 12/22/21 0556    polyethylene glycol (MIRALAX) packet 17 g  17 g Oral Daily PRN Carmine Savoy, MD        potassium chloride in sterile water 20 mEq/50 mL IVPB 20-80 mEq  20-80 mEq Intravenous Daily PRN Bernestine Amass, MD 50 mL/hr at 12/20/21 1757 20 mEq at 12/20/21 1757    Or    potassium chloride in sterile water 40 mEq/100 mL IVPB 20-80 mEq  20-80 mEq Intravenous Daily PRN Bernestine Amass, MD 50 mL/hr at 12/21/21 0829 40 mEq at 12/21/21 9381    Or    potassium chloride (KLOR-CON) ER tablet 20-80 mEq  20-80 mEq Oral Daily PRN Bernestine Amass, MD        potassium chloride in sterile water 20 mEq/50 mL IVPB 20 mEq  20 mEq Intravenous Once Vinodhini Arjunan, MD        ursodioL (ACTIGALL) capsule 300 mg  300 mg Oral BID [facility] Farrel Conners, MD   300 mg at 12/22/21 0900       Vitals  Temp:  [36.4 C (97.5  F)-36.7 C (98.1 F)] 36.7 C (98.1 F)  Heart Rate:  [83-89] 85  *Resp:  [18] 18  BP: (93-114)/(67-80) 93/67  General: resting in bed  HEENT: NC/AT, scleral icterus present   GI: obese, soft, normoactive bowel sounds, generalized tenderness to palpation, non distended  Extremities: no edema  Neuro: A&O x 1, no asterixis    Data  CBC        12/22/21  0521 12/21/21  0403   WBC 3.1* 2.7*   HGB 9.8* 9.6*   HCT 28.4* 28.4*   PLT 89* 87*     Coags        12/22/21  0521 12/21/21  0403   INR 1.3* 1.3*     Chem7        12/22/21  0521 12/21/21  0403   NA 138 137   K 2.7* 3.1*   CL 106 107   CO2 25 24   BUN 2* 3*   CREAT 0.26* 0.31*   GLU 82 80     Electrolytes        12/22/21  0521 12/21/21  0403   CA 7.8* 7.8*     Liver Panel        12/22/21  0521 12/21/21  0403   AST 74* 76*   ALT 64* 66*   ALKP 573* 560*   TBILI 16.8* 16.1*   TP 3.2* 3.1*   ALB 1.4* 1.3*     TSH  No results found in last 7 days  HIV  No results found in last 7 days       CRP        12/13/21  0302   CRP 11.3*     UA  No results found in last 7 days    Microscopy:  No results found in last 7 days  @FEVSF ([id number]:5:0:0:-1)@  Microbiology results - 7 Days   Microbiology Results   Date Collected Specimen Source Result   12/16/2021  9:45 AM Clostridium Difficile Stool, Loose  Clostridium Difficile Toxin gene: Not detected   Clostridium  Difficile Comment: Negative test     12/16/2021  2:01 AM Cytomegalovirus DNA, Quantitative PCR, Plasma Serum  Cmv Dna, Quant. Pcr [id number]   Cmv Dna, Quant. Pcr(log Iu/ml) 5.25     12/16/2021 10:18 AM Rapid HSV DNA, CSF CSF  Hsv1 Dna Not detected   Hsv2 Dna Not detected   Comments (4)   Reference value for all analytes: Not Detected.  Negative test results do not rule out viral infection.       12/16/2021 10:18 AM Cytomegalovirus DNA, Quantitative, Non-plasma samples CSF  Cmv Dna Quant Pcr, Non Plasma Not detected   Cmv Dna Quant Pcr, Non Plasma   (NOTE)  Reference range: Not detected    CMV copy number is determined by real  time PCR amplification of total   plasma DNA using primers to a segment of the UL54 DNA polymerase   gene. A decision to treat CMV infection should take into account the   clinical presentation as well as an increasing level of CMV DNA and   not just the absolute level of the CMV DNA.    For questions regarding interpretation of results, contact Infectious   Disease service.    Linear range: 1.37x10e2 to 9.10x10e6 IU/mL    This test is a modification of the FDA authorized assay. Performance   characteristics have been determined by the [facility].       12/16/2021  9:45 AM GI Bacteria Panel PCR Stool in [facility]  Campylobacter Not detected   E. Coli O157 Not detected   Etec Lt/st Not detected   Salmonella Not detected   Shigella Not detected   Stec Stx1/stx2 Not detected   Vibrio Cholera Not detected   Comments Reference value for all analytes: Not Detected.     12/16/2021  9:45 AM GI Viral Panel PCR Stool in [employer]  Adenovirus 40/41 Not detected   Norovirus Gi/gii DETECTED   Rotavirus A Not detected   Comvgp Reference value for all analytes: Not Detected.     12/16/2021  9:45 AM GI Parasite Panel PCR Stool in [employer]  Cryptosporidium Not detected   Entamoeba Histolytica Not detected   Giardia Not detected   Comgpp Reference value for all analytes: Not Detected.       Radiology Results (last 24 hours)     ** No results found for the last 24 hours. **          IMAGING:    MRCP 12/08/21:   1. Cholelithiasis with mild gallbladder wall edema. Findings are disfavored to represent acute cholecystitis. Gallbladder wall thickening can be seen in the setting of underlying liver disease or cardiac disease.   2. No choledocholithiasis or biliaryobstruction.   3. Marked hepatic steatosis.   4. Moderate bilateral pleural effusions and adjacent atelectasis.    [place] 12/08/21:   Impressions:   1. No interval change without acute intracranial abnormalities.   2. Mild senescent changes are noted  without obvious acute infarct. No hemorrhage, mass or midline shift. No findingsto suggest cerebral edema.    NM Hepatobiliary 12/09/21:   1. Normal radiotracer filling of the gallbladder is identified, therefore the exam is not consistent with cystic duct obstruction or acute cholecystitis.   2. Poor radiotracer uptake and diminished radiotracer excretion by the liver compatible with sequela of hepatocellular dysfunction.    RUQ U/S 12/03/21:   FINDINGS: Liver is mildly enlarged measuring 17.4 cm. There was diffuse increased echogenicity without focal lesions. Liver capsule  was smooth. No dilated intra or extrahepatic ducts, common bile duct measured 4 mm. The gallbladder contained a large shadowing stone measuring 2 cm. There were 2 gallbladder polyps measuring 6 mm and 3 mm. No gallbladder wall thickening or tenderness. Right kidney measured 11.4 cm. Normal echogenicity and no hydronephrosis. Previously seen cyst on the lower pole the right kidney was not visualized.   IMPRESSION:   1. Liver is enlarged with diffuse increased echogenicity. Finding is nonspecific but consistent with fatty infiltration.   2. Large 2 cm stone in the gallbladder, no sonographic evidence of acutecholecystitis.   3. Two gallbladder polyps measuring 6 mm and 3 mm.    TTE 12/03/21:   Conclusions:   The left ventricular cavity size is normal.   Normal LV wall thickness.   The left ventricular ejection fraction is in normal range at 68% by biplane MOD.   No LV regional wall motion abnormalities.   Normal diastolic function for age.       ENDOSCOPY:  No results found for this or any previous visit.    PATHOLOGY:   Liver biopsy 12/11/21 -   Liver, transjugular biopsy:  1.  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  2.  Ductular reaction with focal pericholangitis; see comment.    COMMENT:  The biopsy shows severe steatosis with pericentral/sinusoidal and  periportal fibrosis, histiocytic and  neutrophilic lobular inflammation,  Mallory hyaline as well as rare ballooned hepatocytes. 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  Clinical correlation with steatohepatitis risk factors is suggested,  including for any history of massive weight loss which may lead to an  aggressive onset of steatohepatitis [1].  The history of dexamethasone  use is noted; corticosteroid use has been associated with exacerbation  of pre-existing fatty liver disease [2].    Additionally, there is ductular reaction with focal pericholangitis,  raising consideration for a duct obstructive process. Clinical  correlation is advised. The concern for other drug-induced liver injury  is noted. In particular, thalidomide derivatives (such as pomalidomide)  have been reported in association with vanishing bile duct syndrome  (VBDS) [3]; however, there is no convincing duct loss to suggest VBDS.  There are no clusters of plasma cells seen to suggest involvement by  myeloma.    Given the reported high plasma CMV levels, immunohistochemistry for CMV  will be performed and reported via addendum.      ASSESSMENT AND RECOMMENDATIONS  Ariel Braun is a 61 y.o. 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 Hepatology was consulted for work-up of elevated liver tests.      #Acute Cholestatic Liver Injury    She started daratumumab, pomylast, and dexamethasone weekly in 2021. 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 Her ALKP and  bilirubin rose to 278 and 1.5, respectively on 10/13/21. During her hospitalization at [facility] for hypovolemic shock in the setting of diarrhea from norovirus, her liver tests showed an elevated ALKP in the 1000s (GGT was in the 1000s at the  time as well), AST in the 140s-160s, and ALT in the 120s-180s. Her bilirubin had also steadily rose to the 10s. Today, her ALT 64, AST 74, ALP 573, and total bilirubin 16.8.    Transjugular liver biopsy was done on 12/11/21 with elevated hepatic venous pressure gradient of 8. Biopsy showed steatohepatitis with pericentral/sinusoidal and periportal fibrosis (stage 2, scale 0-4, NASH-CRN method) and ductular reaction with focal pericholangitis, which could be concerning for DILI. Biopsy did not show myleoma or duct loss to suggest vanishing bile duct syndrome. CMV stain is pending.     Overall, patient has an ongoing cholestatic liver injury (R-Factor: 0.3) of unclear etiology at this time. Extensive workup has been done thus far including lab testing, imaging and liver biopsy. 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 Imaging was done with MRCP not showing biliary obstruction and ultrasound not showing normal CBD. Liver biopsy showed steatohepatitis/focal pericholangitis with CMV stain pending. 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 Less likely due vanishing bile duct  syndrome (as pathology did not show bile duct loss), PBC (negative antimitochondrial antibody), PSC (MRCP not showing any strictures), infiltrative malignancy (biopsy not suggestive of myeloma) or choledocholithiasis (MRCP negative for choledocholithiasis). 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     Recommendations:   - Daily LFTs and INR   - Follow up CMV liver biopsy stain  - Continue ganciclovir   - Avoid hypotension  - Recommend TTE to rule out CHF which can predispose patient to a low flow state during periods of hypotension   - Recommend increasing Ursodiol to 600mg  twice daily  - Check hepatitis E       Thank you for involving [place] in [place] care, we will continue to follow with you.  Please don't hesitate to page [phone] with questions.  This patient was discussed with attending, Dr. Darl Householder on 12/22/21.    Chelsea Aus, MD  Gastroenterology Fellow

## 2021-12-22 NOTE — Progress Notes (Signed)
[facility] PROGRESS NOTE  ATTENDING ONLY     My date of service is 12/22/21.    24 Hour Course  - Path pending on colonic biopsies  - Required extra doses of Dilaudid overnight.  - Neurology consult: Medical disease contributing to encephalopathy. Recommend delirium precautions and avoiding Ativan. Try Seroquel for agitations.  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 Will pend final recs on CMV staining.    Subjective  Patient is not able to convey what is bothering her and causing discomfort. Lacks insight. Tearful and moans today. More confused.    Had an episode of hypotension right after PT/OT sat her up in the chair. No symptoms. She eventually corrected herself.    Vitals  Temp:  [36.7 C (98.1 F)] 36.7 C (98.1 F)  Heart Rate:  [83-97] 97  *Resp:  [18] 18  BP: (70-114)/(52-80) 100/71  SpO2:  [97 %] 97 %    MostRecent Weight: 90.9 kg (200 lb 6.4 oz)  Admission Weight: 79.6 kg (175 lb 7.8 oz)      Intake/Output Summary (Last 24 hours) at 12/22/2021 1615  Last data filed at 12/22/2021 9509  Gross per 24 hour   Intake 2663 ml   Output 2700 ml   Net -37 ml       Pain Score: Eyes closed, patient calm    Physical Exam  Appearance: She is ill-appearing.      Comments: Alert and oriented X 1-2 slow to respond. Confused.    HENT:      Head: Normocephalic and atraumatic.      Nose: Nose normal.   Neck:  R IJ CVC with dressing in place, c/d/i  Eyes:      General: Scleral icterus present.      Extraocular Movements: Extraocular movements intact.      Conjunctiva/sclera: Conjunctivae normal.      Pupils: Pupils are equal, round, and reactive to light.   Cardiovascular:      Rate and Rhythm: Normal rate and regular rhythm.      Pulses: Normal pulses.      Heart sounds: Normal heart sounds. No murmur heard.    No friction rub. No gallop.   Pulmonary:      Effort: Pulmonary effort is normal. No respiratory distress.      Breath sounds: Normal breath sounds. No stridor.  No wheezing, rhonchi or rales.   Abdominal:      General: Abdomen is flat. Bowel sounds are normal. There is no distension.      Palpations: Abdomen is soft. There is no mass.      Tenderness: There is no abdominal tenderness. There is no guarding or rebound.      Hernia: No hernia is present.      Comments: Foley catheter in place, urine appears dark  No suprapubic tenderness.   Musculoskeletal:         General: Normal range of motion.      Cervical back: Normal range of motion and neck supple.      Right lower leg: Edema present.      Left lower leg: Edema present.      Comments: Strength 1/5 in the bilateral lower extremities, unable to raise them to gravity.   Pitting edema 3-4+ in the bilateral lower extremities.   Anasarca in the abdomen and thighs bilaterally.    Skin:     General: Skin is warm.      Capillary Refill: Capillary  refill takes less than 2 seconds.      Coloration: Skin is jaundiced.      Findings: Bruising present.      Comments: ecchymoses on the sternum   Neurological:      Mental Status: She is alert. She is disoriented.      Motor: Weakness present.       Scheduled Meds:   0.9% sodium chloride flush  10-20 mL Intravenous Q12H [place]    0.9% sodium chloride flush  3 mL Intravenous Q12H [place]    dicyclomine  20 mg Oral 4x Daily [place]    dry mouth oral rinse  5 mL Mucous Membrane 4x Daily [place]    ganciclovir (CYTOVENE) IVPB  5 mg/kg (Dosing Weight) Intravenous Q12H [place]    immune globulin (human)  20 g Intravenous Once    And    immune globulin (human)  5 g Intravenous Once    multivitamin complete chewable  1 tablet Oral Daily [place]    potassium chloride in sterile water  20 mEq Intravenous Once    ursodioL  300 mg Oral BID [place]     Continuous Infusions:   dextrose 5 % and 0.9 % sodium chloride 75 mL/hr (12/22/21 0759)     PRN Meds:   0.9% sodium chloride flush  10-20 mL Intravenous PRN    0.9% sodium chloride flush  3 mL Intravenous PRN    albuterol  2.5 mg Nebulization Q4H PRN    Or     albuterol  2.5 mg Nebulization Q4H PRN    dextrose 50%  12.5 g Intravenous Q15 Min PRN    Or    glucose  20 g Oral Q15 Min PRN    Or    juice (for hypoglycemia)  16 g Oral Q15 Min PRN    diphenhydrAMINE  50 mg Intravenous Once PRN    EPINEPHrine  0.3 mg Intramuscular Once PRN    hydrocortisone  100 mg Intravenous Once PRN    HYDROmorphone  0.3-0.8 mg Intravenous Q4H PRN    LORazepam  0.5 mg Oral Once PRN    magnesium sulfate in dextrose 5 %  2-4 g Intravenous Daily PRN    Or    magnesium sulfate in water  2-4 g Intravenous Daily PRN    metoclopramide  5-10 mg Intravenous Q8H PRN    ondansetron  8 mg Intravenous Q8H PRN    polyethylene glycol  17 g Oral Daily PRN    potassium chloride in sterile water  20-80 mEq Intravenous Daily PRN    Or    potassium chloride in sterile water  20-80 mEq Intravenous Daily PRN    Or    potassium chloride  20-80 mEq Oral Daily PRN       Data    CBC        12/22/21  0521   WBC 3.1*   HGB 9.8*   HCT 28.4*   PLT 89*     Coags        12/22/21  0521   INR 1.3*     Chem7        12/22/21  0521   NA 138   K 2.7*   CL 106   CO2 25   BUN 2*   CREAT 0.26*   GLU 82     Electrolytes        12/22/21  0521   CA 7.8*     Liver Panel  12/22/21  0521   AST 74*   ALT 64*   ALKP 573*   TBILI 16.8*   TP 3.2*   ALB 1.4*     Lactate  No results found in last 36 hours    Microbiology Results (last 24 hours)     ** No results found for the last 24 hours. **          Radiology Results  No results found.     I discussed with Dr. Bonnita Nasuti from [facility] regarding A&P.    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 The patient has received VRd X 4 (achieving VGPR) then Mel 200 ASCT 05/12/11. 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 She met the criteria for PD on 02/19/2020 and was started on third line DARA PD in June 2021, which was recently held on 10/20/21 due to chronic diarrhea. Patient was  transferred OSH after recent hypovolemic/distributive shock. Here for acute encephalopathy workup and acute liver failure workup.      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   June 2020 due to progression of M spike Revlimid was increased to 15 mg PO D1-21 every 28 days, plus dexamethasone  July 2021: started daratumumab, Pomylast 2 mg D 1-21, Q28 days, dexamethasone 12 mg PO Q weekly.   Primary oncologist: Dr. Iona Coach,  Dr. Kennon Rounds [facility]  Receiving dexamethasone 12 mg weekly for diarrhea  Receiving MT with daratumumab with pomalyst and dex, most recent dose 10/16/21, has since been on hold due to worsening diarrhea.     DATA:   09/07/2010: IgG Kappa MM  12/02/2021: Ig M <5, IgG 447, IgA 20, Ig E <2, SFLC Kappa 16.6, Lambda 1.6, Kappa/Lamda 10.38  12/14/21: IgG K on IFE, IgG 430, KLC 16.6, LLC 2.7, KLR 6.15    Chemo:   Holding Daratumumab and Pomalyst, last dose 10/20/21, given recent infection and acute liver failure.    Supportive Care:   Transfusion: Keep Hg >7, and Platelets >10.   Per daughter, has autoantibodies in blood was a difficult match. Would like to be contacted prior to transfusion.   Line: PICC placed 1/26  Outpatient Oncologist: Dr. Kennon Rounds  Dispo: TBD, after resolution of sepsis and liver failure    # Immunocompromised  #CMV colitis  # At risk for infections due to malignancy  # Shock, likely hypovolemic vs distributive in the setting of chronic diarrhea, + norovirus infection   # Acute on chronic diarrhea, norovirus positive., diff and OP negative.   -No prior evidence of adrenal insufficiency, AM cortisol levels normal, previously on dexamethasone 8 mg PO Qweekly, but has been held since early December. CMV PCR on 1/    Data:  - Ucx: pending  - Bcx: pending  - CT brain: No  acute intracranial hemorrhage, herniation, or hydrocephalus.  - CT chest/A/P pending: Medium-sized bilateral pleural effusions with bilateral peribronchovascular groundglass opacities, suggestive of pulmonary edema. Clustered nodules in right middle lobe along the right minor fissure, which could represent mild aspiration or infection. Numerous sclerotic and mixed lytic/sclerotic lesions throughout the thoracic spine and bilateral ribs, increased in size and number compared to prior. Appearance is atypical  for multiple myeloma but may represent a combination of treated, partially treated, and active disease. No acute pathologic fracture. Findings compatible with enterocolitis of infectious or inflammatory etiology. Involvement of the colon is greater than of the small bowel. Marked hepatic steatosis. Increased sclerosis of bone lesions throughout the thoracolumbar spine, bony pelvis, and proximal left femur. Appearance is atypical for multiple myeloma but may represent a combination of treated, partially treated, and active disease. No acute pathologic fracture.  - 1/29:CMV PCR [id number]  - 1/31: GI PCR panel pending _0    2/1: LP results: Neutrophils 6% in tube 1, Lymphocytes 88% (predominant), protein 42 (normal), glucose 36 (low), rapid HSV in CSF negative. CMV CSF PCR negative    - 2/2 CMV PCR 179,555High   - 2/2 Flex Sig Biopsies pending _1    - 2/5 HLH work up: sIL2, ferritin (948 high), TG(258 high), NKA pending _2      Plan:  - appreciate ID reccs   - Ganciclovir 5 mg/kg (1/31-)    - GI consult: appreciate reccs: flex sig 2/2    - stop zosyn: s/p IV Zosyn 4.5 g Q8hrs (1/26-1/28, restarted 1/29 given recurrent fever-12/16/21)  - checking weekly CMV, next 2/8 _3    - OI ppx: Acyclovir IV    # Grade III neutropenia, at baseline  # Leukopenia  #Thrombocytopenia (possibly 2/2 liver disease)  Data: WBC of 2.2 on 12/05/21, on Zarxio at home  Plan: Continue to monitor    # Acute liver failure of unclear  etiology  # Acute on chronic encephalopathy likely 2/2 infectious vs hepatic vs metabolic  # Hyperbilirubinemia  # Anasarca  # Hypoalbuminemia    Data:   -Steady rise in LFTs in 05/2021,  -12/02/21: AST 152--> 121, ALT 130--> 110, TB 5.4, Albumin 1.1  -Hepatitis panel negative.   -1/26:  Tbili 14.6, GGT 1160, ammonia 25  -CTAP 12/02/21: Scattered sclerotic lesions in the lumbar spine, hepatic steatosis.   -RUQ ultrasound: liver enlarged with diffuse increased echogenicity, consistent with fatty infiltration. 2cm stone in the gallbladder.   -HIDA scan: Normal radiotracer filling of the gallbladder is identified, therefore the exam is not consistent with cystic duct obstruction or acute cholecystitis.  - [place] abd 1/27: Hepatomegaly with diffusely increased echogenicity which can be seen with hepatic amyloidosis or steatosis. Please refer to the recently performed biopsy for further details. Mild perihepatic ascites. Patent hepatic vasculature with normal waveforms. Cholelithiasis without acute cholecystitis. Right pleural effusion.  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 CMV stains: Pending.  - 1/30: Serum studies: Ferritin 2380, iron serum 60, transferrin 46 (low), 93 % sat (high), HEV IgM negative, PCR forVZV negative, HSV negative,CMV+ 443,961High,EBV negative, AMA negative, Hepatitis B core Ab, surface Ab negative, Hep A IgG +/IgM negative   IgG4 pending _4      Plan:   - Appreciate Hepatology consult:    Per hepatology: "Based on history, there is no history of heavy alcohol use. While she has some risk factors for NAFLD, the  extent of hepatic steatosis is  beyond natural history of NASH. At this juncture, the most likely DDx is secondary causes of rapidly  progressive hepatic steatosis...history of steroid  use is the most likely scenario in her case."   - Recommendations:    - Consider avoiding steroid    - Daily LFT/INR    - RD consult placed. Protein shakes to use. Family requesting appetite stimulant. Potential harm than benefit.  - CMV stain on liver biopsy: Pending.  - Hepatology recs: 2/7: Path looks unlikely for vanishing bile duct syndrome. Could be drug related. Final recs pending CMV satining.    #AMS  In the setting of liver failure, ammonia only 25. LP performed 2/1 to r/o encephalitis.     Data:   1/31: HIV, TSH, Folate, B12 WNL  1/30: CT head WNL other than sinus disease  2/1: LP results: Neutrophils 6% in tube 1, Lymphocytes 88% (predominant), protein 42 (normal), glucose 36 (low), rapid HSV in CSF negative. CMV CSF PCR negative  2/5: Brain MRI: Smooth diffuse dural thickening and enhancement as well as bilateral holohemispheric subdural effusions, which may be on the basis of diminished intracranial pressure following recent lumbar puncture.     Plan:  - Neurology consult: 2/7: Encephalopathy likely from underlying medical disease. Delirium prevention. Avoid Ativan and try Seroquel for delirium. MRI findings is from LP. Okay to try caffiene tablets in the daytime to help with postural headache but patient is allergic to it.    # History of PE, diagnosed in April 2019  - hold home [place] for now given recent anemia and concerns for bleeding.   - monitor for signs and symptoms of worsening bleeding.     # Elevated TSH at OSH, likely 2/2 subclinical hypothyroidism  -TSH WNL on 12/15/20.      # Anxiety, emotional distress:   -Patient has stressful social situation, intellectually disabled son, and regarding underlying disease process.     #Chronic buttocks pressure wound  -Wound care consult, appreciate recs  -Position changes as tolerated, waffle  cushion, etc    #VTE PPx:  Contraindicated: Anticipated procedure    Severity of Illness     During this hospitalization the patient is also being treated for:  - Sepsis (present on admission)    - Malignancy associated fatigue on admission  - Neoplasm/malignancy associated pain  - Hypoalbuminemia (present on admission)  - Fluid status: Hypovolemia on admission     - Cachexia  - Encephalopathy: Metabolic encephalopathy   --Pressure wound ruled out, treating bilateral buttocks moisture and friction damage     Code Status: FULL    James Ivanoff, MD  12/22/21

## 2021-12-22 NOTE — Consults (Signed)
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 The patient has received VRd X 4 (achieving VGPR) then Mel 200 ASCT 05/12/11. 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 She met the criteria for PD on 02/19/2020 and was started on third line DARA PD in June 2021, which was recently held on 10/20/21 due to chronic diarrhea. Patient was transferred OSH after recent hypovolemic/distributive shock. Here for acute encephalopathy workup and acute liver failure workup.    Assessment and Treatment Plan    OT Progress summary: Pt continues to request to sit in chair and demosntrated fair sitting balance EOB. Pt remains limited by encephalopathy, weakness, hypotension and impaired motor control. Pt requires max A to perform stand but initiating and participating. Pt requires max A for grooming in chair and moaning throughout session. Pt p/w progressive hypotension from 90/60s down to 68/39. Pt able to recover once dependent transfer back to bed however only able to tolerated 20 min in chair. Pt continues to be limited severe cognitive impairments and weakness. Pt requires max A at this time. Recommend placement.  Progress with OT: Slow progress  Current maximal level of assist: Dependent assist  Next session focus:      Recommendations    Recommended Discharge Disposition Placement for continued therapy   Discharge DME Recommendations Defer to next level of care;To be determined   Equipment Recommendations     DME Comment     Discharge Recommendations Comment     Rehab Potential Patient participates well in therapy and is progressing towards goal   Anticipated Assistance Available at Discharge Family;Spouse/Significant other   Anticipated Type of Assistance Available at Discharge Assist as needed   Anticipated Time of Assistance Available at Discharge     Barriers to Discharge Medical  issues;Impaired Cognition;Insufficient activity tolerance   Patient's current functional ability appropriate for D/C recommendations No     Inpatient Recommendations  OT Inpatient Recommendations     Recommendation Comments Sling to chair w/ maximove and 2p assist   Current Maximal Level of Assist Needed Dependent assist     Brace/Precautions   Brace/Orthotic/Prosthetic:   Precautions:Has weight bearing limitation or precaution: Yes  Precautions and weight bearing status comments: delrium, falls     Subjective Report:"I need to sit"    Patient/Family Goal:     Objective Findings and Interventions    Areas of Occupation    Grooming and Light Hygiene Moderate assistance   Cueing Required     Comment wash hair hand over hand assist with cap. HOH for combing hair, wash face max A   Self Feeding     Cueing Required     Comment     Toileting     Cueing Required     Toileting Clothing Management     Cueing Required     Comment     Upper Body Dressing     Cueing Required     Comment     Lower Body Dressing Maximal assistance   Cueing Required     Comment don socks   Upper Body Bathing     Cueing Required     Comment     Lower Body Bathing     Cueing Required     Comment     ADL/IADL Comment         Functional Transfers  Bed Mobility From Supine   Bed Mobility To Sitting EOB   Level of Assist Dependent   Cueing Required     Transfer From Sit   Transfer To Stand   Level of Assist Maximal assistance   Cueing Required     Technique     Functional Transfer Comment Supine to sit w/ 2p max A and HOB elevated. Pt tolerated 10 min EOB. Sit to stand w/ stedy and max A 2p. Pt transferred to chair. Pt tolerated sitting in chair for 20 min max A grooming. Pt became progressively hypotensive 68/38. Pt dependent maximove 2p assist transfer back to bed. BP recovered after 10 min supine [place] AM-PAC for ADLs  6 Click Score: 11    Client Factors  Cognitive deficits noted in the following area(s):: Attention,  Memory, Judgement/Safety, Arousal, Orientation  Cognition comment:Oriented to self. Moaning but nodding no when asked if she knows why she is moaning. Reporting pain but unable to describe or localize.    Initiation;Organization;Sequencing;Problem ID;Problem solving;Information processing;Safety awareness  Executive functions comment:                                                     OT Education       OT Plan of [place], OT  12/22/2021 2:09 PM

## 2021-12-22 NOTE — Consults (Signed)
INFECTIOUS DISEASES FOLLOW UP CONSULT NOTE  ATTENDING ONLY     My date of service is 12/22/2021.    Interim History since previous Infectious Disease note  - Afebrile since 1/28  - WBC up to 3.1, LFTs stable   - Neurology consulted --> suspect encephalopathy multifactorial, but low suspicion for infection or seizure    Subjective  - Working with nurses at bedside    Medications  Scheduled Meds:   0.9% sodium chloride flush  10-20 mL Intravenous Q12H [place]    0.9% sodium chloride flush  3 mL Intravenous Q12H [place]    dry mouth oral rinse  5 mL Mucous Membrane 4x Daily [place]    ganciclovir (CYTOVENE) IVPB  5 mg/kg (Dosing Weight) Intravenous Q12H [place]    potassium chloride in sterile water  40 mEq Intravenous Once    Followed by    potassium chloride in sterile water  20 mEq Intravenous Once    ursodioL  300 mg Oral BID [place]     Continuous Infusions:   dextrose 5 % and 0.9 % sodium chloride 75 mL/hr (12/22/21 0759)       Antibiotic History  Current  Ganciclovir 1/31-    Prior  Pip-tazo 1/18-1/22, 1/24-1/27, 1/29-1/31  Micafungin 1/31-2/2  CTX 1/19  Vanc 1/18-1/19    Ppx:  Levofloxacin    Vitals  Temp:  [36.2 C (97.2 F)-36.7 C (98.1 F)] 36.7 C (98.1 F)  Heart Rate:  [77-89] 89  *Resp:  [18] 18  BP: (109-114)/(76-80) 113/79  SpO2:  [97 %-100 %] 97 %      Intake/Output Summary (Last 24 hours) at 12/22/2021 1032  Last data filed at 12/22/2021 0950  Gross per 24 hour   Intake 2663 ml   Output 3900 ml   Net -1237 ml       Physical Examination  Constitutional: In bed, eyes open  HENT: NCAT.  MMM  Eyes: Scleral icterus, EOM normal  Neck: Neck supple.  Cardiovascular: Normal rate, regular rhythm, S1/S2 normal.  No murmurs or friction rub.  Pulmonary/Chest: No respiratory distress. CTAB.  No wheezes or rhonchi.   GI: Soft. +BS.  No distension, tenderness, or rebound. No organomegaly.  Extremities: No clubbing, cyanosis, or edema.  Neurological: Awake. Answers "yes/no" if asked to examine. Unable to answer questions to  localize pain.   Skin:Jaundiced No rashes, lesions, or peripheral stigmata of endocarditis.   Psychiatric: Normal mood and affect.     Lines/tubes: LUE PICC    Data    CBC        12/22/21  0521 12/21/21  0403 12/20/21  0341   WBC 3.1* 2.7* 2.6*   HGB 9.8* 9.6* 9.4*   HCT 28.4* 28.4* 27.4*   PLT 89* 87* 83*   NEUTA  --  2.31 2.12   LYMA  --  0.28* 0.28*   MOA  --  0.08* 0.09*   EOA  --  0.00 0.01   BASOA  --  0.01 0.01       Chem7        12/22/21  0521 12/21/21  0403 12/20/21  0341   NA 138 137 137   K 2.7* 3.1* 3.0*   CL 106 107 109   CO2 25 24 21*   BUN 2* 3* 3*   CREAT 0.26* 0.31* 0.29*   GLU 82 80 78       Liver Panel        12/22/21  0521 12/21/21  0403 12/20/21  2671 12/11/21  0328 12/10/21  0208   AST 74* 76* 70*   < > 101*   ALT 64* 66* 64*   < > 116*   ALKP 573* 560* 549*   < > 745*   TBILI 16.8* 16.1* 15.0*   < > 14.6*   TP 3.2* 3.1* 2.9*   < > 3.2*   ALB 1.4* 1.3* 1.3*   < > 2.0*   GGT  --   --   --   --  1,160*    < > = values in this interval not displayed.       CRP        12/13/21  0302   CRP 11.3*       Microbiology    2/1 CSF   0 WBCs, normal protein, glucose low at 36.  HSV PCR neg  CMV PCR neg  2/1 C diff neg  2/1 GI PCR + norovirus  2/1 CMV PCR 179,555   1/30 UCx >100K C albicans  1/29 UCx >100K C albicans  1/29 cBCx x2 NG  1/29 CMV PCR 245,809  1/29 Hep E IgM neg  1/29 HBeAb neg  1/28 VZV PCR neg  1/28 HSV PCR neg  1/27 HAV IgG positive  1/27 EBV PCR neg  1/27 HBsAg neg, sAb 12  1/26 pBCx x2 NG  1/26 MRSA swab neg  1/26 C19 PCR neg  1/26 UCx >100K C [place]    OSH  1/18 GI PCR: +norovirus  1/18: flu, RSV, covid neg  1/22: CMV PCR positive  1/22: EBV PCR neg  1/24: blood cx x2: neg  1/25: VZV PCR: neg    Radiology Results  No results found.     Pathology  12/17/21 Flex sig path: pending    12/11/21 Liver biopsy:  FINAL PATHOLOGIC DIAGNOSIS  Liver, transjugular biopsy:  1.  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  2.  Ductular reaction with focal  pericholangitis; see comment.  COMMENT:  The biopsy shows severe steatosis with pericentral/sinusoidal and  periportal fibrosis, histiocytic and neutrophilic lobular inflammation,  Mallory hyaline as well as rare ballooned hepatocytes. 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  Clinical correlation with steatohepatitis risk factors is suggested,  including for any history of massive weight loss which may lead to an  aggressive onset of steatohepatitis [1].  The history of dexamethasone  use is noted; corticosteroid use has been associated with exacerbation  of pre-existing fatty liver disease [2].  Additionally, there is ductular reaction with focal pericholangitis,  raising consideration for a duct obstructive process. Clinical  correlation is advised. The concern for other drug-induced liver injury  is noted. In particular, thalidomide derivatives (such as pomalidomide)  have been reported in association with vanishing bile duct syndrome  (VBDS) [3]; however, there is no convincing duct loss to suggest VBDS.  There are no clusters of plasma cells seen to suggest involvement by  myeloma.  Given the reported high plasma CMV levels, immunohistochemistry for CMV will be performed and reported via addendum.    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 On ganciclovir since 1/31, last VL downtrending to 179K 2/1.    # Encephalopathy: Low suspicion for infection as LP without pleocytosis and negative CMV PCR; MRI Brain without acute findings.      # Diarrhea with CT + enterocolitis: Ddx includes norovirus, CMV, other. Awaiting pathology from 2/2 flex sig    # Elevated LFTs: 1/27 liver biopsy + steatosis, CMV staining pending. Per Hepatology consider etiologies for rapidly developing steatosis    Dx  - Follow-up additional liver biopsy results including CMV staining  - Follow-up flex sig pathology  - Weekly CMV PCR  Rx  - Continue IV ganciclovir 5 mg/kg q12    We will continue to follow with you.  Please contact [facility] 1st Call via Voalte with questions.    Murlean Hark, MD  12/22/2021

## 2021-12-22 NOTE — Consults (Signed)
NEUROLOGY INITIAL CONSULT NOTE     Consult requested by attending Dr. Seward Grater, MD of the Heme/BMT service.    CC: Encephalopathy    Interpreter Use Documentation  Preferred language: English  If non-English, interpreter modality: N/A - patient's preferred language is English  If interpreter used, document name/ID. If no interpreter used, specify why: N/A - patient's preferred language is English    History of Present Illness:  Ariel Braun is a 61 y.o. 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 Neurology was consulted for encephalopathy workup.     Ariel Braun is a 61 y.o. 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    Per primary team, patient has appeared increasingly uncomfortable in the last several days, but is unable to convey what is bothering her due to her encephalopathy. However, Last night she was more agitated and required PRN dilaudid and ativan.    I spoke with her daughter, Ariel Braun, who reports that [name] first became mildly confused prior to her admission to [facility] on 12/02/20. Her daughter independently notes that her confusion worsens as her bilirubin rises.     Patient has been afebrile since 1/28, with persistent diarrhea. Her LFTs are steadily rising by about 1 point per day and currently are ~ 16. Liver biopsy on 12/11/21 is consistent with severe steatohepatitis, possibly drug induced. LP on 12/16/21 was without evidence of infection (WBC 0) and CMV, HSV1 and HSV2 PCR from CSF sample were negative. Urine culture from 12/14/21 is notable for candida and she is s/p 3 day course of micafungin. Flex sigmoid  pathology biopsy is pending from 12/17/21, however on gross exam colonic wall was edematous throughout with patches of erythema and friable tissue. MRI brain on 2/5 is normal, other than pachymeningeal thickening noted is likely related to prior LP on 2/1.     Patient today is unable to contribute meaningfully, and moaning, but not able to localize her pain. No family members present at bedside.     Past Medical History:  Past Medical History:   Diagnosis Date    Acid reflux disease     Anxiety     Depression     Hereditary angioedema (CMS code)     Multiple myeloma, without mention of having achieved remission     IgG kappa    Myeloma (CMS code) 05/10/2011     Medications:  HOME MEDICATIONS (per [facility]):  Prior to Admission medications    Medication Instructions   acyclovir (ZOVIRAX) 400 mg tablet Take 1 tablet (400 mg total) by mouth Twice a day   apixaban (ELIQUIS) 5 mg tablet Take 5 mg by mouth 2 (two) times daily   cholestyramine light (CHOLESTYRAMINE LIGHT) 4 gram packet Take 1 packet (4 g total) by mouth Twice a day   cholestyramine light 4 gram packet Take 4 g by mouth daily as needed (diarrhea) (Prevalite brand)   dicyclomine (BENTYL) 10 mg capsule Take 10 mg by mouth 3 (three) times daily as needed (stomach pain)   multivit-Braun-FA-lycopen-lutein (CENTRUM SILVER) 0.4 mg-300 mcg- 250 mcg tablet Take 1 tablet by mouth daily   multivitamin tablet Take 1 tablet by mouth daily   amoxicillin (  AMOXIL) 500 mg capsule Take 4 capsules (2,000 mg total) by mouth daily as needed (1 hour prior to dental procedures)  Patient not taking: Reported on 09/22/2021   dexAMETHasone (DECADRON) 4 mg tablet TAKE 2 TABLETS (8 MG TOTAL) BY MOUTH EVERY 7 DAYS.   ecallantide 10 mg/mL (1 mL) SOLN Inject under the skin once as needed   EPINEPHrine (EPIPEN) 0.3 mg/0.3 mL (1:1,000) injection Inject 0.3 mg into the muscle once as needed Use as instructed   hydrOXYzine (ATARAX) 25 mg tablet Take 0.5-1 tablets (12.5-25 mg total) by mouth  every 8 (eight) hours as needed for Anxiety   icatibant (FIRAZYR) 30 mg/3 mL injection Inject 30 mg under the skin once as needed (angioedema)   LORazepam (ATIVAN) 1 mg tablet Take 1 mg by mouth every 6 (six) hours as needed   metoclopramide HCl (REGLAN) 10 mg tablet Take 10 mg by mouth once as needed   ondansetron (ZOFRAN) 8 mg tablet Take 1 tablet (8 mg total) by mouth every 8 (eight) hours as needed.   pomalidomide 2 mg CAP Take 2 mg by mouth daily 21/28 days   predniSONE (DELTASONE) 20 mg tablet Take 20 mg by mouth once as needed       CURRENT INPATIENT MEDICATIONS:  Scheduled Meds:   0.9% sodium chloride flush  10-20 mL Intravenous Q12H [place]    0.9% sodium chloride flush  3 mL Intravenous Q12H [place]    dry mouth oral rinse  5 mL Mucous Membrane 4x Daily [place]    ganciclovir (CYTOVENE) IVPB  5 mg/kg (Dosing Weight) Intravenous Q12H [place]    immune globulin (human)  0.5 g/kg (Ideal) Intravenous Once    potassium chloride in sterile water  20 mEq Intravenous Once    ursodioL  300 mg Oral BID [place]     Continuous Infusions:   dextrose 5 % and 0.9 % sodium chloride 75 mL/hr (12/22/21 0759)     PRN Meds:.0.9% sodium chloride flush, 0.9% sodium chloride flush, albuterol **OR** albuterol, dextrose 50% **OR** glucose **OR** juice (for hypoglycemia), diphenhydrAMINE, EPINEPHrine, hydrocortisone, HYDROmorphone, LORazepam, magnesium sulfate in dextrose 5 % **OR** magnesium sulfate in water, metoclopramide, ondansetron, polyethylene glycol, potassium chloride in sterile water **OR** potassium chloride in sterile water **OR** potassium chloride    Allergies:  Allergies/Contraindications   Allergen Reactions    Caffeine Other (See Comments)     jitters    Diphenoxylate-Atropine Other (See Comments)     Severe stomach pain        Family History:  Unable to obtain due to AMS    Social History:  Unobtainable due to AMS    Review of Systems:   The ROS is unobtainable due to altered mental status.    Physical Examination:  BP  93/67 (BP Location: Right upper arm, Patient Position: Lying)   Pulse 85   Temp 36.7 C (98.1 F) (Oral)   Resp 18   Ht 153 cm (5' 0.24")   Wt 90.9 kg (200 lb 6.4 oz)   LMP  (LMP Unknown)   SpO2 97%   BMI 38.83 kg/m   Constitutional: Appears jaundiced and moaning in bed. Ill appearing and uncomfortable.  EYES: Significant scleral icterus   ENT: Mucus membranes moist  RESP: Breathing comfortably on room air   CV: Significant edema to bilateral LEs  GI: Soft, tender to palpation diffusely  MSK: Swollen bilateral LEs, pain with touching her feet.   SKIN: Scaling on bilateral feet, bruising on chest. Significant jaundice  Neurologic Exam:   Mental Status/Psych: Awake, moaning, regards and tracks. Follows axial, but not appendicular commands with significant delay. Oriented only to self, not to location or month, even with yes/no questioning. Unable to name objects,  Cranial Nerves: VFFTC, PERRL, EOMI, facial strength symmetric at rest.  Motor Exam: no asterixis, normal bulk and tone, Holds arms antigravity without drift. Wiggles toes and moves LEs in plane of bed.   Reflexes:  Unable to assess 2/2 AMS, pain, and edema.  Sensory: Intact to LT throughout  Coordination: Unable to assess  Gait: deferred    LABS:    Recent Labs     12/22/21  0521 12/21/21  0403 12/20/21  0341   WBC 3.1* 2.7* 2.6*   HGB 9.8* 9.6* 9.4*   HCT 28.4* 28.4* 27.4*   PLT 89* 87* 83*   NA 138 137 137   K 2.7* 3.1* 3.0*   CL 106 107 109   CO2 25 24 21*   BUN 2* 3* 3*   CREAT 0.26* 0.31* 0.29*   GLU 82 80 78   CA 7.8* 7.8* 7.4*   PT 15.8* 15.8* 15.9*   INR 1.3* 1.3* 1.3*   AST 74* 76* 70*   ALT 64* 66* 64*   ALKP 573* 560* 549*   TBILI 16.8* 16.1* 15.0*   ALB 1.4* 1.3* 1.3*       Recent Labs     12/22/21  0521 12/21/21  0403 12/20/21  0341   CA 7.8* 7.8* 7.4*     Recent Labs     12/22/21  0521 12/21/21  0403 12/20/21  0341   PT 15.8* 15.8* 15.9*   INR 1.3* 1.3* 1.3*     Recent Labs     12/20/21  0341   TG 259*     Lab Results   Component  Value Date    ESR 5 12/13/2021    CRP 11.3 (H) 12/13/2021    VB12 871 (H) 12/15/2021    TSH 3.98 12/15/2021       LAB STUDIES  ----- CSF -----  Lab Results   Component Value Date    Glucose, CSF 36 (L) 12/16/2021    Protein, Total, CSF 42 12/16/2021    RBCs, CSF 250 (A) 12/16/2021    RBCs, CSF 9 (A) 12/16/2021    Neuts, CSF 6 (A) 12/16/2021    Lymphs, CSF 88 12/16/2021    Lymphs, CSF 100 12/16/2021     ----- INFECTIOUS -----  Lab Results   Component Value Date    HSV1 DNA Not detected 12/16/2021    HSV2 DNA Not detected 12/16/2021    Varicella Zoster DNA Not detected 12/12/2021    Varicella Zoster DNA Reference range: Not detected 12/12/2021    Varicella Zoster DNA  12/12/2021     This test was developed and its performance characteristics determined by the [facility].  It has not been cleared or approved by the U. S. Food and Drug Administration.    HIV Ag/Ab Combo NEG 12/15/2021    Hep B surf Ab, Quant 12 12/11/2021    Hep B surf Ag NEG 12/11/2021    CMV DNA (IU/mL) 179,555 (H) 12/16/2021     ----- AUTOIMMUNE / INFLAMMATORY-----  Lab Results   Component Value Date    IgM, Serum 7 (L) 10/13/2021    IgG 585 (L) 10/13/2021    IgG, serum 430 (L) 12/14/2021    Sedimentation Rate 5 12/13/2021    C-Reactive Protein  11.3 (H) 12/13/2021     ----- TOXIC / METABOLIC -----  Lab Results   Component Value Date    Ammonia 26 12/15/2021    Lactate, plasma 1.7 12/10/2021    LDH, ser/pl 493 (H) 12/10/2021    Kappa Light Chain, Serum, Free 16.6 12/14/2021    Lambda Light Chain, Serum, Free 2.7 (L) 12/14/2021    Kappa/Lambda Ratio, Serum, Free 6.15 (H) 12/14/2021     ----- ENDOCRINE -----  Lab Results   Component Value Date    Thyroid Stimulating Hormone 3.98 12/15/2021     ----- OTHER (MISC TESTS) ---  Lab Results   Component Value Date    Results (Send)  01/21/2011     Performed at [facility], [address], [place], [place] [address]     ----- PENDING LABS ---  Pending Labs     Order  Current Status    Interleukin-2 Receptor, Soluble In process    Natural Killer Cells, Functional In process    Surgical Pathology Specimen Source (enter 1 per line): 1. colon Bx r/o CMV In process           Microbiology results - 1 Month   Microbiology Results   Date Collected Specimen Source Result   12/10/2021 12:04 AM MRSA Culture Anterior Nares Swab  Methicillin Resistant Staph Aureus Screen No Methicillin resistant Staphylococcus aureus (MRSA)isolated.     12/10/2021 12:04 AM COVID-19 RNA, RT-PCR/Nucleic Acid Amplification RETEST (Asymptomatic and No Specific COVID-19 Suspicion); No; No; No; No Procedure Planned; Unknown Bilateral Anterior Nares +/- OP Swab  Covid-19 Rna (HGD92) Not detected   Comments (EQA83) See Comment     12/10/2021 12:04 AM Urine Culture Urine, Midstream  Bacterial Culture, Urine W/o Gram Stain >100,000 Candida albicans     12/10/2021  2:09 AM Peripheral Blood Culture Peripheral Blood  Peripheral Blood Culture No growth 6 days.     12/10/2021  2:11 AM Peripheral Blood Culture Peripheral Blood  Peripheral Blood Culture No growth 6 days.     12/11/2021  4:18 PM Hepatitis A virus Antibody IgG Serum  Hepatitis A Virus Antibody Igg POS     12/12/2021  3:31 AM Varicella zoster virus DNA Blood, EDTA Tube  Varicella Zoster Dna Not detected   Varicella Zoster Dna Reference range: Not detected   Varicella Zoster Dna This test was developed and its performance characteristics determined by the [facility].  It has not been cleared or approved by the U. S. Food and Drug Administration.     12/11/2021  4:18 PM Epstein-Barr Virus by Quantitative PCR Serum  Ebv By Quantitative Pcr, Source PLASMA   Ebv By Quantitative Pcr, Copy/ml <390   Ebv By Quantitative Pcr, Log Copy/ml <2.6   Ebv By Quantitative Pcr, Interp Not Detected     12/11/2021  4:18 PM Hepatitis B Surface Antigen Serum  Hepatitis B Surface Antigen NEG     12/11/2021  4:18 PM Hepatitis B Surface Antibody, Quantitative Serum   Hepatitis B Surface Antibody, Quantitative 12     12/11/2021  4:18 PM Hepatitis B Core Antibody, Total Serum  Hepatitis B Core Antibody, Total NEG     12/12/2021  3:31 AM Rapid HSV DNA, Skin Lesion / Blood Blood, EDTA Tube  Hsv1 Dna Not detected   Hsv2 Dna Not detected   Comments   Reference value for all analytes: Not Detected.  This test is a modification of the FDA authorized assay. Performance characteristics have been determined by the  [facility].       12/12/2021  9:06 PM COVID-19 RNA, RT-PCR/Nucleic Acid Amplification Screening (Asymptomatic and No Specific COVID-19 Suspicion); No; No; No; No Procedure Planned; Unknown Bilateral Anterior Nares +/- OP Swab  Covid-19 Rna (NWG95) Not detected   Comments (AOZ30) See Comment     12/13/2021  3:02 AM Cytomegalovirus DNA, Quantitative PCR, Plasma Serum  Cmv Dna, Quant. Pcr [id number]   Cmv Dna, Quant. Pcr(log Iu/ml) 5.65     12/13/2021  3:02 AM Hepatitis E Antibody, IgM Serum  Hepatitis E Antibody, Igm NOT DETECTED     12/13/2021  3:02 AM Hepatitis B e Antibody Serum  Hepatitis B E Antibody, Qualitative Nonreactive     12/13/2021  3:50 AM Central Blood Culture Central Blood  Central Blood Culture No growth 6 days.     12/13/2021  3:51 AM Central Blood Culture Central Blood  Central Blood Culture No growth 6 days.     12/13/2021  1:29 PM Urine Culture Urine, Indwelling Catheter Not in Vacutainer  Bacterial Culture, Urine W/o Gram Stain >100,000 Presumptive Candida albicans (Definitive identification performed on previous culture from  12/10/21.)     12/14/2021 11:45 AM Urine Culture Urine, Midstream  Bacterial Culture, Urine W/o Gram Stain >100,000 Candida albicans     12/16/2021  9:45 AM Clostridium Difficile Stool, Loose  Clostridium Difficile Toxin gene: Not detected   Clostridium Difficile Comment: Negative test     12/16/2021  2:01 AM Cytomegalovirus DNA, Quantitative PCR, Plasma Serum  Cmv Dna, Quant. Pcr [id number]   Cmv Dna, Quant. Pcr(log Iu/ml) 5.25      12/16/2021 10:18 AM Rapid HSV DNA, CSF CSF  Hsv1 Dna Not detected   Hsv2 Dna Not detected   Comments (4)   Reference value for all analytes: Not Detected.  Negative test results do not rule out viral infection.       12/16/2021 10:18 AM Cytomegalovirus DNA, Quantitative, Non-plasma samples CSF  Cmv Dna Quant Pcr, Non Plasma Not detected   Cmv Dna Quant Pcr, Non Plasma   (NOTE)  Reference range: Not detected    CMV copy number is determined by real time PCR amplification of total   plasma DNA using primers to a segment of the UL54 DNA polymerase   gene. A decision to treat CMV infection should take into account the   clinical presentation as well as an increasing level of CMV DNA and   not just the absolute level of the CMV DNA.    For questions regarding interpretation of results, contact Infectious   Disease service.    Linear range: 1.37x10e2 to 9.10x10e6 IU/mL    This test is a modification of the FDA authorized assay. Performance   characteristics have been determined by the [facility].       12/16/2021  9:45 AM GI Bacteria Panel PCR Stool in [facility]  Campylobacter Not detected   E. Coli O157 Not detected   Etec Lt/st Not detected   Salmonella Not detected   Shigella Not detected   Stec Stx1/stx2 Not detected   Vibrio Cholera Not detected   Comments Reference value for all analytes: Not Detected.     12/16/2021  9:45 AM GI Viral Panel PCR Stool in [employer]  Adenovirus 40/41 Not detected   Norovirus Gi/gii DETECTED   Rotavirus A Not detected   Comvgp Reference value for all analytes: Not Detected.     12/16/2021  9:45 AM GI Parasite Panel PCR Stool in  [place]  Cryptosporidium Not detected   Entamoeba Histolytica Not detected   Giardia Not detected   Comgpp Reference value for all analytes: Not Detected.         IMAGING:   MR Brain with and without Contrast    Result Date: 12/20/2021  1.  No acute intracranial abnormality, specifically no acute infarct or hemorrhage. 2.  Smooth diffuse  dural thickening and enhancement as well as bilateral holohemispheric subdural effusions, which may be on the basis of diminished intracranial pressure following recent lumbar puncture. Report dictated by: Blair Dolphin, MD, signed by: Bluford Main, MD Department of Radiology and Biomedical Imaging    ASSESSMENT:  Ariel Braun is a 61 y.o. 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    Per primary team, patient has appeared increasingly uncomfortable, but is unable to convey what is bothering her due to her encephalopathy. Last night she was more agitated and required PRN dilaudid and ativan. She has been afebrile since 1/28, with persistent diarrhea. Exam is notable for altered mental status consistent with encephalopathy, likely multifactorial. Her LFTs are notable for rising bilirubin, today 16.8. Liver biopsy on 12/11/21 is consistent with severe steatohepatitis. Reassuringly, LP on 12/16/21 was without evidence of infection or inflammation (WBC 0) and CMV, HSV1 and HSV2 PCR from CSF sample were negative. Flex sigmoid pathology biopsy is pending from 12/17/21, however on gross exam colonic wall was edematous throughout with patches of erythema and friable tissue. MRI brain on 12/20/21 is normal, other than pachymeningeal thickening noted that is likely related to prior LP on 2/1.     Suspect her encephalopathy is multifactorial related to her numerous medical issues including liver failure with significantly elevated bilirubin and LFTs, ongoing viremia with CMV and Norovirus, delirium from prolonged hospitalization and illness, and pain. I am reassured against seizures causing her AMS given she is able to follow commands and localizes to sensation.  Recommendations for optimizing her mental status are below and include, adequate pain management while attempting to minimize sedating medications that worsen delirium. Considered the possibility that patient has a post-LP headache, however patient does not clearly localize pain to her head. Could consider caffeine and adequate hydration to prophylactically treat possible headache.       Recommendations:  - Delirium precautions - consider nighttime melatonin 40m scheduled, blinds up during the day, up and out of bed as tolerated. Encourage familiar objects and family around when possible. Minimize night-time sleep disruptions as is medically possible   - Balance addressing pain and agitation, without worsening delirium with sedating medications. Ie - attempt to avoid opiates and benzodiazepines as much as possible possible, and consider low dose seroquel for agitation as well as non-pharmacologic management. (peppermint oil, ice, heat, music etc)    - Encourage adequate hydration as is medically possible given third spacing   - Goal to address underlying hepatic dysfunction and infections and would expect improvement in mental status   - Goal for normalized electrolytes   - No additional lab or imaging workup right now from neurology perspective  - No utility for EEG at this time, as patient is following commands.    *Could consider 103mcaffeine qAM for possible low headache, although patient has it listed as an "allergy" causing "jitteriness"  These recommendations were discussed verbally with Dr. James Ivanoff from the Heme/onc service. Please page [phone] with any questions.     Trula Slade, MD, [facility], PGY3  12/22/21

## 2021-12-22 NOTE — Consults (Addendum)
DOS: 12/22/21     NUTRITION SERVICES ASSESSMENT    Patient History:   Ariel Braun is a 61 y.o. 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 The patient has received VRd X 4 (achieving VGPR) then Mel 200 ASCT 05/12/11. 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 She met the criteria for PD on 02/19/2020 and was started on third line DARA PD in June 2021, which was recently held on 10/20/21 due to chronic diarrhea. Patient was transferred OSH after recent hypovolemic/distributive shock. Here for acute encephalopathy workup and acute liver failure workup.    Pertinent interval history:  Nutrition following given poor PO intake and significant weight loss PTA    In-House Nutrition Orders:  Regular Diet       Anthropometric Data and Assessment:  Height: 153 cm (5' 0.24")  Ideal Body Weight: 45.99 kg    Usual body weight: 90.91 kg (verified by pt's daughter)  Admit weight: 79.6 kg (175 lb 7.8 oz) (12/09/21 2356) (Unknown)  Current weight: 90.9 kg (200 lb 6.4 oz) (12/22/21 0600) (Unknown)    Calculation Weight: 79.6 kg (Percent IBW (calculated) (%): 173)  Calc weight BMI: 34 kg/m^2  Alternative Weight (kg): 56.75 kg    Wt Readings from Last 10 Encounters:   12/22/21 90.9 kg (200 lb 6.4 oz)   12/21/21 91.4 kg (201 lb 8 oz)   12/20/21 90.6 kg (199 lb 11.8 oz)   12/17/21 89.8 kg (197 lb 15.6 oz)   12/16/21 88.1 kg (194 lb 3.6 oz)   12/15/21 90.2 kg (198 lb 13.7 oz)   12/14/21 88 kg (194 lb 0.1 oz)   11/16/21 88 kg (194 lb 0.1 oz)   12/12/21 81 kg (178 lb 9.2 oz)   12/11/21 83 kg (182 lb 15.7 oz)   12/09/21 79.6 kg (175 lb 7.8 oz) - Admit weight   06/25/21 90.7 kg (200 lb) - CE; verbal wt   12/12/18 96.3 kg (212 lb 4.9 oz)       Total weight change: -11.1 kg (-12.24 %)  Time frame for weight change: 6-7 months    Assessment of weight change:    Using pt's last recorded UBW and admit weight, pt has lost 11 kg, 12% weight in the last 6-7 months, c/w severe weight loss. In-house  weights likely influenced by fluid shifts given pt is edematous and I/Os showing +12.7 L since admit. Will use admit weight for calculations.    Nutrition-focused physical findings:   Visual Assessment: c/w anthros; pt unable to communicate    Physical Exam (Date 02/07)  Muscles  Temples: Well-nourished: Can see/feel well-defined muscle  Clavicle: Well-nourished: Not visible in female, visible but not prominent in female  Shoulders: Well-nourished: Rounded, curves at arm/shoulder/neck  Adipose  Orbital: Well-nourished: Slightly bulged fat pads. Fluid retention may mask loss  Triceps: Well-nourished: Ample fat tissue obvious between folds of skin    Edema:   Generalized Edema: Non-pitting (12/21/21 2020)  RUE Edema: Non-pitting (12/22/21 0950)  LUE Edema: Non-pitting (12/22/21 0950)  RLE Edema: Deep pitting, indentation remains for a short time (12/22/21 0950)  LLE Edema: Deep pitting, indentation remains for a short time (12/22/21 0950)    Cognition:   Level of Consciousness: Alert, Awake  Orientation Level: Oriented to person, Disoriented to place, Disoriented to time, Disoriented to situation  Cognition: Follows commands, Poor attention/concentration  Speech: Delayed responses, Word finding difficulty    GI Findings and Assessment:  LBM this AM (soft, loose)      GI: Appetite, decrease, Diarrhea (ongoing diarrhea)   Per RN assessment:   Gastrointestinal  *Gastrointestinal (WDL): Exceptions to WDL  Mucous Membrane(s): Dry, Intact  Abdomen Inspection: Soft, Nondistended  Bowel Sounds (All Quadrants): Active  Tenderness: Soft  Passing Flatus: Yes  GI Symptoms: Loss of appetite  Relieved By: Frequent small meals    Respiratory support: None (Room air) (12/22/21 1105)    Output (02/06)  Urine: 3350 mL    Lines, Drains, Airway, Wounds:   Patient Lines/Drains/Airways Status     Active LDAs     Name Placement date Placement time Site Days    PICC Double Lumen 12/10/21 Power Central Left;Upper Arm 1:Purple 2:Red 7 cm  12/10/21  1534  Arm  11    Indwelling Urinary Catheter 12/19/21 16 12/19/21  0550  --  3    Wound 12/19/21 Skin tear Sacrum 12/19/21  1900  Sacrum  2              SLP (01/30); Pt demonstrated suspected functional swallow with mild delayed throat clear on one trial thins likely d/t dry throat (endorsed by patient). No other s/sx asp observered across any PO trials (thins/solids). Prior coughing episodes reported from PT likely d/t AMS and fatigue. Rec continue on reg diet with thins. Rec continued 1:1 assist on all PO d/t AMS and general body fatigue iso complex medical course. SLP to s/o and d/c from services. Re-consult if needed.    Food and Nutrition Intake:   Information obtained from: Family (pt's daughter)    Oral Food and Fluid Intake  Baseline Modified Diet: Texture Modification  Meal/Snack Pattern: Grazing  Meal Intake Percentage: <50% of meals  Poor Meal Intake Assessment Period: greater than or equal to 1 month    Diet Recall Details: In-house having bites of soup, pudding, ice cream, jello, and shakes. Minimal to no Boost. Prior to admit for over 4 months, began eating soft/blended foods similar to in-house intake.  Food Allergies/Intolerances: No food allergies mentioned     Behavior  Mealtime Behavior: Long meal duration, Refusal to eat/chew     Percentage of meals eaten for the past 168 hrs:   Percent Meals Eaten (%)   12/20/21 0907 10 %   12/19/21 1128 10 %   12/18/21 2014 5 %   12/18/21 1045 10 %   12/17/21 2130 0 %   12/17/21 1015 0 %   12/17/21 0400 0 %   12/16/21 2136 0 %   12/16/21 1156 10 %   12/16/21 0935 0 %   12/16/21 0425 0 %   12/15/21 2013 25 %       Intake Data (x4 months)  Assessment of energy/protein intake:   Suboptimal protein/energy intake  Intake met what percentage of estimated needs?: < 25% of estimated needs  Duration of sub-optimal intake: greater than or equal to 1 month     Barriers to adequate nutrient intake: Altered mental status, Unable to tolerate PO intake  Pt' daughter  describes that pt only takes soft/pureed foods. She also will only take in small amounts before stating that she doesn't want any more.    Significant Labs:   Biochemical:  Lab Results   Component Value Date    Sodium, Serum / Plasma 138 12/22/2021    Potassium, Serum / Plasma 2.7 (LL) 12/22/2021    Chloride, Serum / Plasma 106 12/22/2021    Carbon Dioxide, Total 25 12/22/2021  Urea Nitrogen, Serum / Plasma 2 (L) 12/22/2021    Creatinine 0.26 (L) 12/22/2021    eGFRcr 125 12/22/2021    Glucose, non-fasting 82 12/22/2021    Calcium, total, Serum / Plasma 7.8 (L) 12/22/2021   Noted hypokalemia --> team repleted    Lab Results   Component Value Date    Alanine transaminase 64 (H) 12/22/2021    AST 74 (H) 12/22/2021    Alkaline Phosphatase 573 (H) 12/22/2021    Bilirubin, Direct 10.1 (H) 12/10/2021    Bilirubin, Total 16.8 (H) 12/22/2021    Gamma-Glutamyl Transpeptidase 1,160 (H) 12/10/2021    Triglycerides, serum 259 (H) 12/20/2021    PT 15.8 (H) 12/22/2021    Ammonia 26 12/15/2021   Evidence of impaired liver fxn    Lab Results   Component Value Date    Hemoglobin 9.8 (L) 12/22/2021    MCV 99 12/22/2021    Abs Neutrophils 2.68 12/22/2021   Evidence of normocytic anemia    Lab Results   Component Value Date    Glucose, Glucometer 85 12/22/2021    Glucose, Glucometer 94 12/21/2021    Glucose, Glucometer 79 12/21/2021    Glucose, Glucometer 151 12/21/2021    Glucose, Glucometer 65 (L) 12/21/2021    Glucose, Glucometer 89 12/20/2021    Glucose, Glucometer 90 12/20/2021    Glucose, Glucometer 90 12/19/2021    Glucose, Glucometer 90 12/19/2021    Glucose, Glucometer 98 12/18/2021     Micronutrient Profile:  Lab Results   Component Value Date    Ferritin 948 (H) 12/20/2021    Iron, serum 60 12/11/2021    Transferrin 46 (L) 12/11/2021    % Saturation 93 (H) 12/11/2021    Vitamin B12 871 (H) 12/15/2021    Folate, serum 3.9 (L) 12/15/2021     Inflammatory profile:   Lab Results   Component Value Date    C-Reactive Protein 11.3  (H) 12/13/2021    Albumin, Serum / Plasma 1.4 (L) 12/22/2021    WBC Count 3.1 (L) 12/22/2021   Evidence of inflammation, likely contributing to low albumin     Stool studies:  Lab Results   Component Value Date    Clostridium difficile Toxin gene: Not detected 12/16/2021    Clostridium difficile Comment: Negative test 12/16/2021       Significant Meds:   Scheduled Meds:   0.9% sodium chloride flush  10-20 mL Intravenous Q12H [place]    0.9% sodium chloride flush  3 mL Intravenous Q12H [place]    dry mouth oral rinse  5 mL Mucous Membrane 4x Daily [place]    ganciclovir (CYTOVENE) IVPB  5 mg/kg (Dosing Weight) Intravenous Q12H [place]    immune globulin (human)  20 g Intravenous Once    And    immune globulin (human)  5 g Intravenous Once    potassium chloride in sterile water  20 mEq Intravenous Once    ursodioL  300 mg Oral BID [place]     Continuous Infusions:   dextrose 5 % and 0.9 % sodium chloride 75 mL/hr (12/22/21 0759)     PRN Meds:.0.9% sodium chloride flush, 0.9% sodium chloride flush, albuterol **OR** albuterol, dextrose 50% **OR** glucose **OR** juice (for hypoglycemia), diphenhydrAMINE, EPINEPHrine, hydrocortisone, HYDROmorphone, LORazepam, magnesium sulfate in dextrose 5 % **OR** magnesium sulfate in water, metoclopramide, ondansetron, polyethylene glycol, potassium chloride in sterile water **OR** potassium chloride in sterile water **OR** potassium chloride    Comparative Standards:    Energy Needs: MSJ: 1672.13- 1800.75 (based on  1286.25: x 1.3 - 1.4)   Protein Needs: 73.78 g - 79.45 g (1.3 - 1.4 g/kg based on 56.75 kg)   Fluid needs: [place]-Segar: 2051.25 mL/d     Nutrition diagnosis:   Inadequate oral intake related to AMS as evidenced by sub-optimal PO intake for >1 month, severe weight loss. New.    Malnutrition Summary:  Moderate Chronic disease or condition related malnutrition related to AMS as evidenced by weight loss of >10% in 6 months and energy intake of <75% for >/= 1 month .  Present on  admission.     Nutrition Intervention:  Oral Nutrition:   Inpatient Orders: Current diet order appropriate     Nutrition recommendations to patient:   -->Recommend feeding assistance  -->Provided menu selection assistance  -->Provided menu addendum (puree menu)     Oral Nutrition Supplements and/or Food Fortification  --> Boost Plus Vanilla, 1 daily  --> Smoothies/Shakes per pt preference      Vitamins/Minerals  --> Check 25-(OH) Vitamin D with next lab draw to assess for deficiency.  --> Flintstones Chewable Multivitamin (1 tab, daily)  --> Recommend phos and mag lab to check serum levels of lytes given potassium low     Nutrition Related Medication Recommendations:   --> Recommend appetite stimulant if medically feasible; daughter asked about stating one     Nutrition Monitoring/Evaluation:  Nutrition Goal Indicator: Food intake  Nutrition Goal Criteria: Intake of greater than or equal to 75% of 3 meals per day  Nutrition Goal Progress: Some digression away from goal  Nutrition Goal 2 Indicator: Food intake  Nutrition Goal 2 Criteria: Intake of 1-3 oral nutrition supplements per day  Nutrition Goal 2 Progress: Some digression away from goal    Sonny Dandy, [place], [place]

## 2021-12-23 LAB — POCT GLUCOSE
Glucose, Glucometer: 109 mg/dL (ref 70–199)
Glucose, Glucometer: 104 mg/dL (ref 70–199)

## 2021-12-23 LAB — COMPLETE BLOOD COUNT WITH DIFFERENTIAL
Abs Basophils: 0 10*9/L (ref 0.00–0.10)
Abs Eosinophils: 0 10*9/L (ref 0.00–0.40)
Abs Imm Granulocytes: 0.08 10*9/L (ref <0.10)
Abs Lymphocytes: 0.21 10*9/L — ABNORMAL LOW (ref 1.00–3.40)
Abs Monocytes: 0.09 10*9/L — ABNORMAL LOW (ref 0.20–0.80)
Abs Neutrophils: 3.19 10*9/L (ref 1.80–6.80)
Hematocrit: 26.6 % — ABNORMAL LOW (ref 36.0–46.0)
Hemoglobin: 9 g/dL — ABNORMAL LOW (ref 12.0–15.5)
MCH: 34.2 pg — ABNORMAL HIGH (ref 26.0–34.0)
MCHC: 33.8 g/dL (ref 31.0–36.0)
MCV: 101 fL — ABNORMAL HIGH (ref 80–100)
MPV: 13.3 fL — ABNORMAL HIGH (ref 9.1–12.6)
Platelet Count: 90 10*9/L — ABNORMAL LOW (ref 140–450)
RBC Count: 2.63 10*12/L — ABNORMAL LOW (ref 4.00–5.20)
RDW-CV: 24.5 % — ABNORMAL HIGH (ref 11.7–14.4)
WBC Count: 3.6 10*9/L (ref 3.4–10.0)

## 2021-12-23 LAB — INTERLEUKIN-2 RECEPTOR, SOLUBLE: Interleukin-2 Receptor, Soluble: 1556.6 pg/mL — ABNORMAL HIGH (ref 175.3–858.2)

## 2021-12-23 LAB — BASIC METABOLIC PANEL (NA, K, CL, CO2, BUN, CR, GLU, CA)
Anion Gap: 7 (ref 4–14)
Calcium, total, Serum / Plasma: 7.7 mg/dL — ABNORMAL LOW (ref 8.4–10.5)
Carbon Dioxide, Total: 23 mmol/L (ref 22–29)
Chloride, Serum / Plasma: 107 mmol/L (ref 101–110)
Creatinine: 0.34 mg/dL — ABNORMAL LOW (ref 0.55–1.02)
Glucose, non-fasting: 103 mg/dL (ref 70–199)
Potassium, Serum / Plasma: 3.7 mmol/L (ref 3.5–5.0)
Sodium, Serum / Plasma: 137 mmol/L (ref 135–145)
Urea Nitrogen, serum/plasma: 3 mg/dL — ABNORMAL LOW (ref 7–25)
eGFRcr: 117 mL/min/{1.73_m2} (ref >59)

## 2021-12-23 LAB — PROTHROMBIN TIME
INR: 1.4 — ABNORMAL HIGH (ref 0.9–1.2)
PT: 16.2 s — ABNORMAL HIGH (ref 11.6–15.0)

## 2021-12-23 LAB — COMPREHENSIVE METABOLIC PANEL (BMP, AST, ALT, T.BILI, ALKP, TP ALB)
AST: 68 U/L — ABNORMAL HIGH (ref 5–44)
Alanine transaminase: 57 U/L (ref 10–61)
Albumin, Serum / Plasma: 1.3 g/dL — ABNORMAL LOW (ref 3.4–4.8)
Alkaline Phosphatase: 491 U/L — ABNORMAL HIGH (ref 38–108)
Anion Gap: 7 (ref 4–14)
Bilirubin, Total: 15.7 mg/dL — ABNORMAL HIGH (ref 0.2–1.2)
Calcium, total, Serum / Plasma: 7.6 mg/dL — ABNORMAL LOW (ref 8.4–10.5)
Carbon Dioxide, Total: 25 mmol/L (ref 22–29)
Chloride, Serum / Plasma: 105 mmol/L (ref 101–110)
Creatinine: 0.32 mg/dL — ABNORMAL LOW (ref 0.55–1.02)
Glucose, non-fasting: 108 mg/dL (ref 70–199)
Potassium, Serum / Plasma: 2.8 mmol/L — CL (ref 3.5–5.0)
Protein, Total, Serum / Plasma: 3.9 g/dL — ABNORMAL LOW (ref 6.3–8.6)
Sodium, Serum / Plasma: 137 mmol/L (ref 135–145)
Urea Nitrogen, Serum / Plasma: 3 mg/dL — ABNORMAL LOW (ref 7–25)
eGFRcr: 119 mL/min/{1.73_m2} (ref >59)

## 2021-12-23 LAB — MAGNESIUM, SERUM / PLASMA
Magnesium, Serum / Plasma: 1.4 mg/dL — ABNORMAL LOW (ref 1.6–2.6)
Magnesium, Serum / Plasma: 2.1 mg/dL (ref 1.6–2.6)

## 2021-12-23 LAB — PHOSPHORUS, SERUM / PLASMA: Phosphorus, Serum / Plasma: 2.8 mg/dL (ref 2.3–4.7)

## 2021-12-23 LAB — FIBRINOGEN, FUNCTIONAL: Fibrinogen, Functional: 162 mg/dL — ABNORMAL LOW (ref 202–430)

## 2021-12-23 LAB — VITAMIN D, 25-HYDROXY: Vitamin D, 25-Hydroxy: 6 ng/mL — ABNORMAL LOW (ref 20–50)

## 2021-12-23 MED ORDER — POTASSIUM CHLORIDE 20 MEQ/50 ML IN STERILE WATER INTRAVENOUS PIGGYBACK
20 | Freq: Once | INTRAVENOUS | Status: AC
Start: 2021-12-23 — End: 2021-12-23
  Administered 2021-12-24: 07:00:00 via INTRAVENOUS

## 2021-12-23 MED ORDER — ERGOCALCIFEROL (VITAMIN D2) 1,250 MCG (50,000 UNIT) CAPSULE
50000 unit | ORAL | Status: DC
Start: 2021-12-23 — End: 2022-01-06
  Administered 2021-12-23: via ORAL
  Administered 2021-12-30: 18:00:00 50000 [IU] via ORAL

## 2021-12-23 MED ORDER — POTASSIUM CHLORIDE 20 MEQ/50 ML IN STERILE WATER INTRAVENOUS PIGGYBACK
20 | Freq: Once | INTRAVENOUS | Status: AC
Start: 2021-12-23 — End: 2021-12-23
  Administered 2021-12-23: 21:00:00 via INTRAVENOUS

## 2021-12-23 MED ORDER — POTASSIUM CHLORIDE 40 MEQ/100ML IN STERILE WATER INTRAVENOUS PIGGYBACK
40 | Freq: Once | INTRAVENOUS | Status: AC
Start: 2021-12-23 — End: 2021-12-23
  Administered 2021-12-24: 05:00:00 via INTRAVENOUS

## 2021-12-23 MED ORDER — CHOLECALCIFEROL (VITAMIN D3) 10 MCG (400 UNIT) TABLET
400 | ORAL | Status: DC
Start: 2021-12-23 — End: 2021-12-23

## 2021-12-23 MED ORDER — POTASSIUM CHLORIDE 20 MEQ/50 ML IN STERILE WATER INTRAVENOUS PIGGYBACK
20 | Freq: Once | INTRAVENOUS | Status: DC
Start: 2021-12-23 — End: 2021-12-23

## 2021-12-23 MED ORDER — POTASSIUM CHLORIDE 40 MEQ/100ML IN STERILE WATER INTRAVENOUS PIGGYBACK
40100 mEq/100 mL | Freq: Once | INTRAVENOUS | Status: DC
Start: 2021-12-23 — End: 2021-12-25

## 2021-12-23 MED ORDER — THIAMINE HCL (VITAMIN B1) 100 MG/ML INJECTION SOLUTION
100 mg/mL | INTRAMUSCULAR | Status: DC
Start: 2021-12-23 — End: 2022-01-15
  Administered 2021-12-23 – 2022-01-08 (×17): via INTRAVENOUS
  Administered 2022-01-09: 22:00:00 200 mg via INTRAVENOUS
  Administered 2022-01-10 – 2022-01-15 (×6): via INTRAVENOUS

## 2021-12-23 MED ORDER — URSODIOL 300 MG CAPSULE
300 | Freq: Two times a day (BID) | ORAL | Status: DC
Start: 2021-12-23 — End: 2022-01-02
  Administered 2021-12-24 – 2022-01-02 (×20): via ORAL

## 2021-12-23 MED ORDER — POTASSIUM CHLORIDE 40 MEQ/100ML IN STERILE WATER INTRAVENOUS PIGGYBACK
40 | Freq: Once | INTRAVENOUS | Status: DC
Start: 2021-12-23 — End: 2021-12-23

## 2021-12-23 MED FILL — FLINTSTONES COMPLETE (IRON) CHEWABLE TABLET: ORAL | Qty: 1

## 2021-12-23 MED FILL — POTASSIUM CHLORIDE 20 MEQ/50 ML IN STERILE WATER INTRAVENOUS PIGGYBACK: 20 mEq/50 mL | INTRAVENOUS | Qty: 50

## 2021-12-23 MED FILL — DICYCLOMINE 10 MG CAPSULE: 10 mg | ORAL | Qty: 2

## 2021-12-23 MED FILL — URSODIOL 300 MG CAPSULE: 300 mg | ORAL | Qty: 1

## 2021-12-23 MED FILL — GANCICLOVIR SODIUM 500 MG INTRAVENOUS SOLUTION: 500 mg | INTRAVENOUS | Qty: 8

## 2021-12-23 MED FILL — DILAUDID (PF) 0.5 MG/0.5 ML INJECTION SYRINGE: 0.5 mg/ mL | INTRAMUSCULAR | Qty: 0.5

## 2021-12-23 MED FILL — MAGNESIUM SULFATE 2 GRAM/50 ML (4 %) IN WATER INTRAVENOUS PIGGYBACK: 2 gram/50 mL (4 %) | INTRAVENOUS | Qty: 50

## 2021-12-23 MED FILL — THIAMINE HCL (VITAMIN B1) 100 MG/ML INJECTION SOLUTION: 100 mg/mL | INTRAMUSCULAR | Qty: 2

## 2021-12-23 MED FILL — ERGOCALCIFEROL (VITAMIN D2) 1,250 MCG (50,000 UNIT) CAPSULE: 50000 unit | ORAL | Qty: 1

## 2021-12-23 MED FILL — POTASSIUM CHLORIDE 40 MEQ/100ML IN STERILE WATER INTRAVENOUS PIGGYBACK: 40 mEq/100 mL | INTRAVENOUS | Qty: 100

## 2021-12-23 NOTE — Consults (Signed)
INFECTIOUS DISEASES FOLLOW UP CONSULT NOTE  ATTENDING ONLY     My date of service is 12/23/2021.    Interim History since previous Infectious Disease note  - Afebrile since 1/28  - WBC up to 3.6, LFTs slightly downtrending  - Liver path CMV IHC returned negative    Subjective  - No fevers, abdominal pain, diarrhea    Medications  Scheduled Meds:   0.9% sodium chloride flush  10-20 mL Intravenous Q12H [place]    0.9% sodium chloride flush  3 mL Intravenous Q12H [place]    dicyclomine  20 mg Oral 4x Daily [place]    dry mouth oral rinse  5 mL Mucous Membrane 4x Daily [place]    ergocalciferol (vitamin D2)  50,000 Units Oral Q7 Days [place]    ganciclovir (CYTOVENE) IVPB  5 mg/kg (Dosing Weight) Intravenous Q12H [place]    multivitamin complete chewable  1 tablet Oral Daily [place]    potassium chloride in sterile water  20 mEq Intravenous Once    potassium chloride in sterile water  40 mEq Intravenous Once    thiamine  200 mg Intravenous Q24H    ursodioL  600 mg Oral BID [place]     Continuous Infusions:   dextrose 5 % and 0.9 % sodium chloride 75 mL/hr (12/23/21 1204)       Antibiotic History  Current  Ganciclovir 1/31-    Prior  Pip-tazo 1/18-1/22, 1/24-1/27, 1/29-1/31  Micafungin 1/31-2/2  CTX 1/19  Vanc 1/18-1/19    Ppx:  Levofloxacin    Vitals  Temp:  [36.2 C (97.2 F)-37 C (98.6 F)] 36.6 C (97.9 F)  Heart Rate:  [75-107] 88  *Resp:  [16-18] 17  BP: (92-108)/(60-76) 99/69  SpO2:  [95 %-99 %] 96 %      Intake/Output Summary (Last 24 hours) at 12/23/2021 2117  Last data filed at 12/23/2021 1601  Gross per 24 hour   Intake 2780 ml   Output 2400 ml   Net 380 ml       Physical Examination  Constitutional: In bed, eyes open  HENT: NCAT.  MMM  Eyes: Scleral icterus, EOM normal  Neck: Neck supple.  Cardiovascular: Normal rate, regular rhythm, S1/S2 normal.  No murmurs or friction rub.  Pulmonary/Chest: No respiratory distress. CTAB.  No wheezes or rhonchi.   GI: Soft. +BS.  No distension, tenderness, or rebound. No  organomegaly.  Extremities: No clubbing, cyanosis, or edema.  Neurological: Awake, answers questions more appropriately  Skin:Jaundiced No rashes, lesions, or peripheral stigmata of endocarditis.   Psychiatric: Normal mood and affect.     Lines/tubes: LUE PICC    Data    CBC        12/23/21  0418 12/22/21  0521 12/21/21  0403   WBC 3.6 3.1* 2.7*   HGB 9.0* 9.8* 9.6*   HCT 26.6* 28.4* 28.4*   PLT 90* 89* 87*   NEUTA 3.19 2.68 2.31   LYMA 0.21* 0.24* 0.28*   MOA 0.09* 0.08* 0.08*   EOA 0.00 0.01 0.00   BASOA 0.00 0.01 0.01       Chem7        12/23/21  1734 12/23/21  0418 12/22/21  1749   NA 137 137 137   K 3.4* 2.8* 3.7   CL 107 105 107   CO2 23 25 23    BUN 3* 3* 3*   CREAT 0.38* 0.32* 0.34*   GLU 103 108 103       Liver Panel  12/23/21  0418 12/22/21  0521 12/21/21  0403 12/11/21  0328 12/10/21  0208   AST 68* 74* 76*   < > 101*   ALT 57 64* 66*   < > 116*   ALKP 491* 573* 560*   < > 745*   TBILI 15.7* 16.8* 16.1*   < > 14.6*   TP 3.9* 3.2* 3.1*   < > 3.2*   ALB 1.3* 1.4* 1.3*   < > 2.0*   GGT  --   --   --   --  1,160*    < > = values in this interval not displayed.       CRP        12/13/21  0302   CRP 11.3*       Microbiology  [place]  2/1 CSF   0 WBCs, normal protein, glucose low at 36.  HSV PCR neg  CMV PCR neg  2/1 C diff neg  2/1 GI PCR + norovirus  2/1 CMV PCR 179,555   1/30 UCx >100K C albicans  1/29 UCx >100K C albicans  1/29 cBCx x2 NG  1/29 CMV PCR 024,097  1/29 Hep E IgM neg  1/29 HBeAb neg  1/28 VZV PCR neg  1/28 HSV PCR neg  1/27 HAV IgG positive  1/27 EBV PCR neg  1/27 HBsAg neg, sAb 12  1/26 pBCx x2 NG  1/26 MRSA swab neg  1/26 C19 PCR neg  1/26 UCx >100K C [place]    OSH  1/18 GI PCR: +norovirus  1/18: flu, RSV, covid neg  1/22: CMV PCR positive  1/22: EBV PCR neg  1/24: blood cx x2: neg  1/25: VZV PCR: neg    Radiology Results  No results found.     Pathology  12/17/21 Flex sig path: pending    12/11/21 Liver biopsy:  FINAL PATHOLOGIC DIAGNOSIS  Liver, transjugular biopsy:  1.  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  2.  Ductular reaction with focal pericholangitis; see comment.  COMMENT:  The biopsy shows severe steatosis with pericentral/sinusoidal and  periportal fibrosis, histiocytic and neutrophilic lobular inflammation,  Mallory hyaline as well as rare ballooned hepatocytes. 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  Clinical correlation with steatohepatitis risk factors is suggested,  including for any history of massive weight loss which may lead to an  aggressive onset of steatohepatitis [1].  The history of dexamethasone  use is noted; corticosteroid use has been associated with exacerbation  of pre-existing fatty liver disease [2].  Additionally, there is ductular reaction with focal pericholangitis,  raising consideration for a duct obstructive process. Clinical  correlation is advised. The concern for other drug-induced liver injury  is noted. In particular, thalidomide derivatives (such as pomalidomide)  have been reported in association with vanishing bile duct syndrome  (VBDS) [3]; however, there is no convincing duct loss to suggest VBDS.  There are no clusters of plasma cells seen to suggest involvement by  myeloma.  Addendum Comment  In order to further evaluate this case, immunohistochemistry for CMV was  performed and evaluated on block A1 as follows:  - CMV:          Negative.      Assessment and Recommendations  60W with MM s/p mel-auto 2012 on dara / pom / dex with subacute weakness and GI illness (abdominal pain, diarrhea) and encephalopathy since 07/2021 admitted 12/02/21 to [facility] for norovirus c/b hypovolemic  shock transferred to [facility] 12/09/21. 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 On ganciclovir since 1/31, last VL  downtrending to 179K 2/1.    # Encephalopathy: Low suspicion for infection as LP without pleocytosis and negative CMV PCR; MRI Brain without acute findings. Per Neuro, toxic     # Diarrhea with CT + enterocolitis: Ddx includes norovirus, CMV, other. Awaiting pathology from 2/2 flex sig    # Elevated LFTs: 1/27 liver biopsy + steatosis, CMV IHC neg. Per Hepatology, NASH + hypoperfusion + cholestasis.    Dx  - Follow-up flex sig pathology  - Weekly CMV PCR (12/23/21 pending)  Rx  - Continue IV ganciclovir 5 mg/kg q12    We will continue to follow with you.  Please contact Consult [facility] 1st Call via Voalte with questions.    Murlean Hark, MD  12/23/2021

## 2021-12-23 NOTE — Consults (Signed)
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 The patient has received VRd X 4 (achieving VGPR) then Mel 200 ASCT 05/12/11. 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 She met the criteria for PD on 02/19/2020 and was started on third line DARA PD in June 2021, which was recently held on 10/20/21 due to chronic diarrhea. Patient was transferred OSH after recent hypovolemic/distributive shock. Here for acute encephalopathy workup and acute liver failure workup    ASSESSMENT  Patient with improved orientation for the first time with more self-limiting behavior today. Patient wanting to lie back down upon sitting EOB. Extensive education regarding importance of upright and OOB activity. She does not recall getting out of bed yesterday. She required Max A x 2 to perform sit>stand with use of STEDY. Patient with decreased effort attaining less hip extension in standing. BP stable today sitting EOB and once seated in chair. She will benefit from skilled PT and placement for continued therapy to improve her motor control, balance and endurance for decreased caregiver burden with functional mobility.    Focus next session: upright tolerance, OOB, therex    RECOMMENDATIONS  DISCHARGE RECOMMENDATION  Comments Placement with ongoing PT needs      Patient Current Functional Status Sufficient for PT Discharge Recommendation Yes   Discharge DME needs TBD pending patient progress.   Discharge transportation needs [place]  Inpatient Rehab Assistive Device Recommendation Vertical dependent lift;Ceiling lift   Activity Recommendation Bed in chair position, OOB to chair with maxi move     SUBJECTIVE  Subjective report:  "I need to lie down!"  Notable observations:  Supine in bed with HOB elevated at beginning of session. Seated in chair with sling underneath, safety belt closed  to front with OT in room at end of session.    SYSTEMS REVIEW  Cognition/Communication  Delirium screen:  Yes Delirium Screen:   Details:  Encephalopathic    Communication  Cognition/communication impaired:  Yes  [place] Agitation Sedation Scale: (-1) Drowsy, Not fully alert, but has sustained (more than 10 secondsd) awakening, with eye contact, to voice  Arousal comments: (-1) to (0)  Orientation: Disoriented to time, Disoriented to event  Communication: Difficulty making basic needs known, Difficulty making complex needs known, Expressive deficits  Communication comment or intervention : Moaning once sitting.  Cognition: Decreased attention, Delayed response, Impaired motor planning, Decreased safety awareness, Decreased insight to deficits, Decreased command following  Cognition comment: Able to follow one step commands ~ 50% of the time. Unable to recall previous sessions, does not believe she stood up before.  Behavior: Self-limiting, Anxious  Behavior comment: Fearful of falling. Strong desire to lie down.    Integumentary  Integumentary deficits:  YesIntegumentary deficit detail:  Jaundiced, edematous, wounds at buttocks, foley    Cardiopulmonary  Cardiopulmonary deficits:  Yes  Detail:  Decreased activity tolerance    Musculoskeletal  Musculoskeletal deficits:  Yes  Abnormal strength findings: Bilateral shoulder flexion 3-/5 bilaterally, elbow flexion at least 3/5 bilaterally. Hip flexion 2+/5 bilaterally, knee extension 2+/5 bilaterally, ankle DF 3-/5 bilaterally    Neuromuscular  Neuromuscular deficits:  Yes  Motor control or coordination: Impaired at bilateral lower extremities >> bilateral upper extremities        Pain     Currently in pain: Yes  Pain location:  Appears to experiece pain with LE AAROM, unable to localize pain          COMPREHENSIVE MOVEMENT ANALYSIS/TREATMENT  Precautions/WB status: Yes  Precautions and weight bearing status comments: delrium, falls      Hemodynamic response:   Normal  hemodynamic response: Yes  Response:     Comments: BP 90s/60 supine in bed, SBP 117 then 107 sitting EOB, patient endorses feeling "lightheaded". BP 90s/60s sitting in chair.      Functional Mobility  Requires second person/additional health care providers: Yes  Type additional health care provider: OT    Bed mobility Current Initial    Supine < > Sit  Level of assist  Dependent assist;Two person assist  Dependent assist;Two person assist (12/15/21 0932)   Device   Through long sitting;Bed rail;Head of bed elevated (12/13/21 1430)   Intervention   Verbal cues;Tactile cues;External cues (12/13/21 1430)   Supine<>sit comments:   Dep x 2 pivot to EOB    Transfer Current Initial   Sit < > Stand  Level of assist  Maximal assist;Two person assist  Maximal assist;Two person assist (12/16/21 0930)     Sit < > Stand comments:  Patient with decreased effort attempting sit>stand today. Patient stating that she doesn't trust therapists, and telling her daughter on the phone that therapist's will not let her lie down. Max A x 2 to perform sit>stand from edge of elevated bed with decreased hip extension today. Max A to stand from flaps of STEDY and to control descent to chair with decreased hip extension in standing.    Balance  Balance deficits noted: Yes  Functional Balance for ADLs  Position Static Dynamic   Sit Static sitting level of assist: Minimal assist-   Static sitting comment: CGA-Min A at trunk Dynamic sitting level of assist: Maximal assist-  Dynamic sitting comment: scooting anteriorly     Stand Static Standing level of assist: Supervision-   Static standing comment: Max A at trunk with BUE support on STEDY, decreased hip extension today  -        Exercise/other interventions  Supine exercises comment (other exercise, details): bilateral shoulder flexion x 10, ankle pumps x 10  Other exercise: Extensive education on importance of upright activity and OOB activity to improve patient's functional  mobility    Communication between other health care providers: Communication between other health care provider: OT;RN  Communication comment:       Education assessment  Learner: Patient  Content: Plan of care, Activity recommendations  Response: Needs reinforcement  Outcome measures   Physical Therapist Global Assessment of Mobility  Activity Achieved: Active stand with any level of assistance  AMPAC 6-clicks basic mobility score: 9      PLAN  Plan of care status:  Current plan of care remains appropriate  PT frequency:  5x/week  PT duration:  4 weeks.  Comment:  POC expires 2/26      Planned PT interventions:   Specific interventions: Progressive functional mobility training;Gait training;Balance training;NM Re-ed;Aerobic training;Ther ex  Education interventions: Caregiver training;Benefits of activity;Self-pacing/breathing;Exercise program;Mobility with precautions compliance;Fall risk reduction  Comment:          Leotis Shames, PT, DPT, NCS    12/23/2021

## 2021-12-23 NOTE — Progress Notes (Addendum)
[facility] PROGRESS NOTE  ATTENDING ONLY     My date of service is 12/23/21.    24 Hour Course  - Path pending on colonic biopsies  - Neurology recs: Repeat CT brain to monitor subdural effusion, EEG, thiamine iv repletion.  -Hepatology follow up: CMV stain negative. Probably from hypotension and possible expose to drugs that could cause liver injury. Increase Ursodiol to 600 mg BID, check Hepatitis e and repeat TTE.  -IVIG for CMV treatment.    Subjective  Pt is up and sitting in a chair by the bedside and feeling well. More alert and tried to answer questions directly. Denies any focal symptoms. Moans on and off from discomfort.    Vitals  Temp:  [36.1 C (97 F)-36.9 C (98.4 F)] 36.9 C (98.4 F)  Heart Rate:  [75-104] 85  *Resp:  [16-20] 18  BP: (92-117)/(60-80) 92/62  SpO2:  [95 %-98 %] 96 %    MostRecent Weight: 90.2 kg (198 lb 13.7 oz)  Admission Weight: 79.6 kg (175 lb 7.8 oz)      Intake/Output Summary (Last 24 hours) at 12/23/2021 1457  Last data filed at 12/23/2021 1248  Gross per 24 hour   Intake 3325 ml   Output 3700 ml   Net -375 ml       Pain Score: 0    Physical Exam  Appearance: She is ill-appearing.      Comments: Alert and oriented X 3 slow to respond. Lacks insight    HENT:      Head: Normocephalic and atraumatic.      Nose: Nose normal.   Neck:  R IJ CVC with dressing in place, c/d/i  Eyes:      General: Scleral icterus present.      Extraocular Movements: Extraocular movements intact.      Conjunctiva/sclera: Conjunctivae normal.      Pupils: Pupils are equal, round, and reactive to light.   Cardiovascular:      Rate and Rhythm: Normal rate and regular rhythm.      Pulses: Normal pulses.      Heart sounds: Normal heart sounds. No murmur heard.    No friction rub. No gallop.   Pulmonary:      Effort: Pulmonary effort is normal. No respiratory distress.      Breath sounds: Normal breath sounds. No stridor. No wheezing, rhonchi or rales.   Abdominal:      General: Abdomen is  flat. Bowel sounds are normal. There is no distension.      Palpations: Abdomen is soft. There is no mass.      Tenderness: There is no abdominal tenderness. There is no guarding or rebound.      Hernia: No hernia is present.      Comments: Foley catheter in place, urine appears dark  No suprapubic tenderness.   Musculoskeletal:         General: Normal range of motion.      Cervical back: Normal range of motion and neck supple.      Right lower leg: Edema present.      Left lower leg: Edema present.      Comments: Strength 1/5 in the bilateral lower extremities, unable to raise them to gravity.   Pitting edema 3-4+ in the bilateral lower extremities.   Anasarca in the abdomen and thighs bilaterally.    Skin:     General: Skin is warm.      Capillary Refill: Capillary refill takes less than  2 seconds.      Coloration: Skin is jaundiced.      Findings: Bruising present.      Comments: ecchymoses on the sternum   Neurological:      Mental Status: She is alert. She is disoriented.      Motor: Weakness present.       Scheduled Meds:   0.9% sodium chloride flush  10-20 mL Intravenous Q12H [place]    0.9% sodium chloride flush  3 mL Intravenous Q12H SCH    dicyclomine  20 mg Oral 4x Daily [place]    dry mouth oral rinse  5 mL Mucous Membrane 4x Daily [place]    ganciclovir (CYTOVENE) IVPB  5 mg/kg (Dosing Weight) Intravenous Q12H [place]    multivitamin complete chewable  1 tablet Oral Daily [place]    potassium chloride in sterile water  40 mEq Intravenous Once    thiamine  200 mg Intravenous Q24H    ursodioL  600 mg Oral BID [place]     Continuous Infusions:   dextrose 5 % and 0.9 % sodium chloride 75 mL/hr (12/23/21 1204)     PRN Meds:   0.9% sodium chloride flush  10-20 mL Intravenous PRN    0.9% sodium chloride flush  3 mL Intravenous PRN    albuterol  2.5 mg Nebulization Q4H PRN    Or    albuterol  2.5 mg Nebulization Q4H PRN    dextrose 50%  12.5 g Intravenous Q15 Min PRN    Or    glucose  20 g Oral Q15 Min PRN    Or     juice (for hypoglycemia)  16 g Oral Q15 Min PRN    diphenhydrAMINE  50 mg Intravenous Once PRN    EPINEPHrine  0.3 mg Intramuscular Once PRN    hydrocortisone  100 mg Intravenous Once PRN    HYDROmorphone  0.3-0.8 mg Intravenous Q4H PRN    LORazepam  0.5 mg Oral Once PRN    magnesium sulfate in dextrose 5 %  2-4 g Intravenous Daily PRN    Or    magnesium sulfate in water  2-4 g Intravenous Daily PRN    metoclopramide  5-10 mg Intravenous Q8H PRN    ondansetron  8 mg Intravenous Q8H PRN    polyethylene glycol  17 g Oral Daily PRN    potassium chloride in sterile water  20-80 mEq Intravenous Daily PRN    Or    potassium chloride in sterile water  20-80 mEq Intravenous Daily PRN    Or    potassium chloride  20-80 mEq Oral Daily PRN       Data    CBC        12/23/21  0418 12/22/21  0521   WBC 3.6 3.1*   HGB 9.0* 9.8*   HCT 26.6* 28.4*   PLT 90* 89*     Coags        12/23/21  0418 12/22/21  0521   INR 1.4* 1.3*     Chem7        12/23/21  0418 12/22/21  1749 12/22/21  0521   NA 137 137 138   K 2.8* 3.7 2.7*   CL 105 107 106   CO2 _0 BUN 3* 3* 2*   CREAT 0.32* 0.34* 0.26*   GLU 108 103 82     Electrolytes        12/23/21  0418 12/22/21  1749 12/22/21  4481  CA 7.6* 7.7* 7.8*   MG 2.1 1.4*  --    PO4 2.8  --   --      Liver Panel        12/23/21  0418 12/22/21  0521   AST 68* 74*   ALT 57 64*   ALKP 491* 573*   TBILI 15.7* 16.8*   TP 3.9* 3.2*   ALB 1.3* 1.4*     Lactate  No results found in last 36 hours    Microbiology Results (last 24 hours)     Procedure Component Value Units Date/Time    Hepatitis E Antibody, IgM [[id number]] Collected: 12/23/21 1303    Order Status: Sent Specimen: Not Applicable from Serum Updated: 12/23/21 1353    Cytomegalovirus DNA, Quantitative PCR, Plasma [[id number]] Collected: 12/23/21 0418    Order Status: Sent Specimen: Not Applicable from Serum Updated: 12/23/21 0513          Radiology Results  No results found.     I discussed with Dr. Bonnita Nasuti from [facility] regarding  A&P.    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 The patient has received VRd X 4 (achieving VGPR) then Mel 200 ASCT 05/12/11. 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 She met the criteria for PD on 02/19/2020 and was started on third line DARA PD in June 2021, which was recently held on 10/20/21 due to chronic diarrhea. Patient was transferred OSH after recent hypovolemic/distributive shock. Here for acute encephalopathy workup and acute liver failure workup.      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   June 2020 due to progression of M spike Revlimid was increased to 15 mg PO D1-21 every 28 days, plus dexamethasone  July 2021: started daratumumab, Pomylast 2 mg D 1-21, Q28 days, dexamethasone 12 mg PO Q weekly.   Primary oncologist: Dr. Iona Coach,  Dr. Kennon Rounds [facility]  Receiving dexamethasone 12 mg weekly for diarrhea  Receiving MT with daratumumab with pomalyst and dex, most recent dose 10/16/21, has since been on hold due to worsening diarrhea.     DATA:   09/07/2010: IgG Kappa MM  12/02/2021: Ig M <5, IgG 447, IgA 20, Ig E <2, SFLC Kappa 16.6, Lambda 1.6, Kappa/Lamda 10.38  12/14/21: IgG K on IFE, IgG 430, KLC 16.6, LLC 2.7, KLR 6.15    Chemo:   Holding Daratumumab and Pomalyst, last dose 10/20/21, given recent infection and acute liver failure.    Supportive Care:   Transfusion: Keep Hg >7, and Platelets >10.   Per daughter, has autoantibodies in blood was a difficult match. Would like to be contacted prior to transfusion.   Line: PICC placed 1/26  Outpatient Oncologist: Dr. Kennon Rounds  Dispo: TBD, after resolution of sepsis and liver failure    # Immunocompromised  #CMV colitis  # At risk for  infections due to malignancy  # Shock, likely hypovolemic vs distributive in the setting of chronic diarrhea, + norovirus infection   # Acute on chronic diarrhea, norovirus positive., diff and OP negative.   -No prior evidence of adrenal insufficiency, AM cortisol levels normal, previously on dexamethasone 8 mg PO Qweekly, but has been held  since early December. CMV PCR on 1/    Data:  - Ucx: pending  - Bcx: pending  - CT brain: No acute intracranial hemorrhage, herniation, or hydrocephalus.  - CT chest/A/P pending: Medium-sized bilateral pleural effusions with bilateral peribronchovascular groundglass opacities, suggestive of pulmonary edema. Clustered nodules in right middle lobe along the right minor fissure, which could represent mild aspiration or infection. Numerous sclerotic and mixed lytic/sclerotic lesions throughout the thoracic spine and bilateral ribs, increased in size and number compared to prior. Appearance is atypical for multiple myeloma but may represent a combination of treated, partially treated, and active disease. No acute pathologic fracture. Findings compatible with enterocolitis of infectious or inflammatory etiology. Involvement of the colon is greater than of the small bowel. Marked hepatic steatosis. Increased sclerosis of bone lesions throughout the thoracolumbar spine, bony pelvis, and proximal left femur. Appearance is atypical for multiple myeloma but may represent a combination of treated, partially treated, and active disease. No acute pathologic fracture.  - 1/29:CMV PCR [id number]  - 1/31: GI PCR panel positive only for Norovirus.   2/1: LP results: Neutrophils 6% in tube 1, Lymphocytes 88% (predominant), protein 42 (normal), glucose 36 (low), rapid HSV in CSF negative. CMV CSF PCR negative    - 2/2 CMV PCR 179,555High   - 2/2 Flex Sig Biopsies pending _0    - 2/5 [place] work up: sIL2 (1556.6 high), ferritin (948 high), TG(258 high), NKA pending _1      Plan:  - appreciate ID  reccs   - Ganciclovir 5 mg/kg (1/31-). S/p one dose of IVIG on 2/7.   - GI consult: appreciate reccs: flex sig 2/2    - stop zosyn: s/p IV Zosyn 4.5 g Q8hrs (1/26-1/28, restarted 1/29 given recurrent fever-12/16/21)  - checking weekly CMV, next 2/8 _2    - OI ppx: Acyclovir IV    # Grade III neutropenia, at baseline  # Leukopenia  #Thrombocytopenia (possibly 2/2 liver disease)  Data: WBC of 2.2 on 12/05/21, on Zarxio at home  Plan: Continue to monitor    # Acute liver failure of unclear etiology  # Acute on chronic encephalopathy likely 2/2 infectious vs hepatic vs metabolic  # Hyperbilirubinemia  # Anasarca  # Hypoalbuminemia    Data:   -Steady rise in LFTs in 05/2021,  -12/02/21: AST 152--> 121, ALT 130--> 110, TB 5.4, Albumin 1.1  -Hepatitis panel negative.   -1/26:  Tbili 14.6, GGT 1160, ammonia 25  -CTAP 12/02/21: Scattered sclerotic lesions in the lumbar spine, hepatic steatosis.   -RUQ ultrasound: liver enlarged with diffuse increased echogenicity, consistent with fatty infiltration. 2cm stone in the gallbladder.   -HIDA scan: Normal radiotracer filling of the gallbladder is identified, therefore the exam is not consistent with cystic duct obstruction or acute cholecystitis.  - [place] abd 1/27: Hepatomegaly with diffusely increased echogenicity which can be seen with hepatic amyloidosis or steatosis. Please refer to the recently performed biopsy for further details. Mild perihepatic ascites. Patent hepatic vasculature with normal waveforms. Cholelithiasis without acute cholecystitis. Right pleural effusion.  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 CMV stains: Pending.  -  1/30: Serum studies: Ferritin 2380, iron serum 60, transferrin 46 (low),  93 % sat (high), HEV IgM negative, PCR forVZV negative, HSV negative,CMV+ 443,961High,EBV negative, AMA negative, Hepatitis B core Ab, surface Ab negative, Hep A IgG +/IgM negative   IgG4 pending _0      Plan:   - Appreciate Hepatology consult:    Per hepatology: "Based on history, there is no history of heavy alcohol use. While she has some risk factors for NAFLD, the  extent of hepatic steatosis is beyond natural history of NASH. At this juncture, the most likely DDx is secondary causes of rapidly  progressive hepatic steatosis...history of steroid use is the most likely scenario in her case."   - Recommendations:    - Consider avoiding steroid    - Daily LFT/INR    - RD consult placed. Protein shakes to use. Family requesting appetite stimulant. Potential harm than benefit.  - CMV stain on liver biopsy: Pending.  - Hepatology recs: 2/7: Path looks unlikely for vanishing bile duct syndrome. Could be drug related. Increase Ursodiol to 600 mg BID, Check Hepatitis E, TTE.    #AMS  In the setting of liver failure, ammonia only 25. LP performed 2/1 to r/o encephalitis.     Data:   1/31: HIV, TSH, Folate, B12 WNL  1/30: CT head WNL other than sinus disease  2/1: LP results: Neutrophils 6% in tube 1, Lymphocytes 88% (predominant), protein 42 (normal), glucose 36 (low), rapid HSV in CSF negative. CMV CSF PCR negative  2/5: Brain MRI: Smooth diffuse dural thickening and enhancement as well as bilateral holohemispheric subdural effusions, which may be on the basis of diminished intracranial pressure following recent lumbar puncture.     Plan:  - Neurology consult: 2/7: Encephalopathy likely from underlying medical disease. Delirium prevention. Avoid Ativan and try Seroquel for delirium. MRI findings is from LP. Okay to try caffiene tablets in the daytime to help with postural headache but patient is allergic to it.  -2/8: Repeat CT head  to monitor the status of subdural effusion, EEG, Thiamine iv prophylaxis.    #Hypokalemia:  #Hypomagnesemia:  Replete as needed.    #Vitamin D deficiency:  -Supplements ordered.    #Moderate malnutrition:  -Seen by RD and given recs for supplements.    # History of PE, diagnosed in April 2019  - hold home [place] for now given recent anemia and concerns for bleeding.   - monitor for signs and symptoms of worsening bleeding.     # Elevated TSH at OSH, likely 2/2 subclinical hypothyroidism  -TSH WNL on 12/15/20.      # Anxiety, emotional distress:   -Patient has stressful social situation, intellectually disabled son, and regarding underlying disease process.     #Chronic buttocks pressure wound  -Wound care consult, appreciate recs  -Position changes as tolerated, waffle cushion, etc    #VTE PPx:  Contraindicated: Anticipated procedure    Severity of Illness     During this hospitalization the patient is also being treated for:  - Sepsis (present on admission)    - Malignancy associated fatigue on admission  - Neoplasm/malignancy associated pain  - Hypoalbuminemia (present on admission)  - Fluid status: Hypovolemia on admission     - Cachexia  - Encephalopathy: Metabolic encephalopathy   --Pressure wound ruled out, treating bilateral buttocks moisture and friction damage     Code Status: FULL    James Ivanoff, MD  12/23/21

## 2021-12-23 NOTE — Procedures (Signed)
[facility]  [address] Tel:  [phone]  [place] [address] Fax: [phone]  ______________________________________________    VIDEO-EEG Daily Report  Patient Name: Ariel Braun  MRN: [id number]  DOB: 08/07/1961)  Room/Bed: L1105/L1105-01  Attending / Referring Physician: Seward Grater, MD    OBJECT OF RECORDING: 61 y.o. 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        STUDY START: 12/23/2021 at 1432  STUDY STOP: 12/23/2021 at [place]:  Recordings were obtained using a standard international 10-20 electrode placement in a 19-channel standard recording supplemented with a single electrocardiogram chest electrode. The recordings were obtained using a reference electrode and reformatted into bipolar montages for review. Concurrent continuous video recording was utilized. Throughout the entire monitoring evaluation, a video sitter (EEG technologist) observed the patient in real-time. The Natus/Persyst spike and seizure detection computer program was used to screen the EEG. An epilepsy fellow and the attending neurologist reviewed the entire recording including detections. They also reviewed concurrent EEG and Video during episodes of interest.    FINDINGS - DAILY SUMMARIES AND DETAILED SEIZURE DATA:   DAY 1: 12/23/2021 from 1432 to 2359  Relevant medications: No antiepileptic or general anesthetic medications.    Background: The waking background at rest is continuous, composed but slow with an admixture of largely delta, theta and alpha frequencies. The expected anterior-posterior voltage and frequency organization was poorly formed, with intermixed faster frequencies anteriorly and a symmetric and well-formed posterior  dominant alpha rhythm of 7 Hz that was reactive to eye opening. There were no interhemispheric asymmetries or areas of focal slowing.   During sleep, differentiation is observed but no sleep architecture is observed..   Interictal: No interictal epileptiform abnormalities.   Ictal/push-button events: There are no electrographic or clinical seizures.      IMPRESSION:   This continuous Video-EEG monitoring study was abnormal due to poorly formed waking organization and mild diffuse slowing. Sleep wake differentiation is apparent. There were no interhemispheric asymmetries, areas of focal slowing, epileptiform discharges or seizures. This study is indicative of non-specific mild cerebral dysfunction.    Note: This signed report pertains to the time period between START and STOP above. The continuous-EEG recording itself is still ongoing.    Epilepsy Fellow(s):  Jones Broom MD   Epilepsy/EMU attending(s) and dates of service:  Inda Merlin MD, PhD, 12/23/2021

## 2021-12-23 NOTE — Consults (Signed)
Hepatology/Liver Transplant Service Consultation Note     24 Hour Course  - LFTs improved today    Subjective  Patient mental status has improved today. She denies any abdominal pain or fevers.     Objective  Current Facility-Administered Medications   Medication Dose Route Frequency Provider Last Rate Last Admin    0.9% sodium chloride flush injection syringe 10-20 mL  10-20 mL Intravenous Q12H [facility] Zena Amos, DO   20 mL at 12/22/21 2112    0.9% sodium chloride flush injection syringe 10-20 mL  10-20 mL Intravenous PRN Zena Amos, DO   10 mL at 12/19/21 2053    0.9% sodium chloride flush injection syringe 3 mL  3 mL Intravenous Q12H [facility] Carmine Savoy, MD   3 mL at 12/19/21 2054    0.9% sodium chloride flush injection syringe 3 mL  3 mL Intravenous PRN Carmine Savoy, MD        albuterol (PROVENTIL) 2.5 mg /3 mL (0.083 %) inhalation solution 2.5 mg  2.5 mg Nebulization Q4H PRN Vinodhini Arjunan, MD        Or    albuterol (PROVENTIL) inhalation solution 2.5 mg  2.5 mg Nebulization Q4H PRN Vinodhini Arjunan, MD        dextrose 5 % and 0.9 % sodium chloride infusion  75 mL/hr Intravenous Continuous Zena Amos, DO 75 mL/hr at 12/23/21 1204 75 mL/hr at 12/23/21 1204    dextrose 50% injection syringe 12.5 g  12.5 g Intravenous Q15 Min PRN Zena Amos, DO   12.5 g at 12/21/21 0940    Or    glucose chewable tablet 20 g  20 g Oral Q15 Min PRN Zena Amos, DO        Or    juice (for hypoglycemia) 16 g  16 g Oral Q15 Min PRN Zena Amos, DO        dicyclomine (BENTYL) capsule 20 mg  20 mg Oral 4x Daily [facility] Vinodhini Arjunan, MD   20 mg at 12/23/21 1540    diphenhydrAMINE (BENADRYL) injection 50 mg  50 mg Intravenous Once PRN Vinodhini Arjunan, MD        dry mouth oral rinse (BIOTENE DRY MOUTH) solution 5 mL  5 mL Mucous Membrane 4x Daily [facility] Farrel Conners, MD   5 mL at 12/23/21 1300    EPINEPHrine (ADRENALIN) 1 mg/mL (1 mL) injection 0.3 mg   0.3 mg Intramuscular Once PRN Vinodhini Arjunan, MD        ergocalciferol (vitamin D2) (ERGOCALCIFEROL) capsule 50,000 Units  50,000 Units Oral Q7 Days [facility] Vinodhini Arjunan, MD   50,000 Units at 12/23/21 1546    ganciclovir (CYTOVENE) 400 mg in dextrose 5% 100 mL IVPB  5 mg/kg (Dosing Weight) Intravenous Q12H [facility] Farrel Conners, MD   Ended at 12/23/21 1100    hydrocortisone sodium succinate (SOLU-CORTEF) 100 mg/2 mL injection 100 mg  100 mg Intravenous Once PRN Vinodhini Arjunan, MD        HYDROmorphone (DILAUDID) injection syringe 0.3-0.8 mg  0.3-0.8 mg Intravenous Q4H PRN Deboraha Sprang, MD   0.5 mg at 12/23/21 1059    LORazepam (ATIVAN) tablet 0.5 mg  0.5 mg Oral Once PRN Sandria Manly, MD        magnesium sulfate in dextrose 5 % 1 g/100 mL IVPB 2-4 g  2-4 g Intravenous Daily PRN Bernestine Amass, MD        Or    magnesium sulfate in water 2 gram/50  mL (4 %) IVPB 2-4 g  2-4 g Intravenous Daily PRN Bernestine Amass, MD 50 mL/hr at 12/17/21 0658 2 g at 12/17/21 0658    metoclopramide (REGLAN) injection 5-10 mg  5-10 mg Intravenous Q8H PRN Bernestine Amass, MD   10 mg at 12/12/21 2102    multivitamin complete chewable (FLINTSTONE'S COMPLETE) tablet 1 tablet  1 tablet Oral Daily [facility] Vinodhini Arjunan, MD   1 tablet at 12/23/21 0942    ondansetron (ZOFRAN) injection 8 mg  8 mg Intravenous Q8H PRN Zena Amos, DO   8 mg at 12/22/21 1425    polyethylene glycol (MIRALAX) packet 17 g  17 g Oral Daily PRN Carmine Savoy, MD        potassium chloride in sterile water 20 mEq/50 mL IVPB 20-80 mEq  20-80 mEq Intravenous Daily PRN Bernestine Amass, MD 50 mL/hr at 12/20/21 1757 20 mEq at 12/20/21 1757    Or    potassium chloride in sterile water 40 mEq/100 mL IVPB 20-80 mEq  20-80 mEq Intravenous Daily PRN Bernestine Amass, MD 50 mL/hr at 12/23/21 0608 40 mEq at 12/23/21 8329    Or    potassium chloride (KLOR-CON) ER tablet 20-80 mEq  20-80 mEq Oral Daily PRN Bernestine Amass, MD        potassium chloride in sterile water 40  mEq/100 mL IVPB 40 mEq  40 mEq Intravenous Once [name], MD   Ended at 12/23/21 1309    thiamine (VITAMIN B1) injection 200 mg  200 mg Intravenous Q24H Vinodhini Arjunan, MD   200 mg at 12/23/21 1540    ursodioL (ACTIGALL) capsule 600 mg  600 mg Oral BID [place] Vinodhini Arjunan, MD           Vitals  Temp:  [36.1 C (97 F)-37 C (98.6 F)] 37 C (98.6 F)  Heart Rate:  [75-107] 107  *Resp:  [16-18] 18  BP: (92-113)/(60-77) 108/76  General: resting in bed  HEENT: NC/AT, scleral icterus present   GI: obese, soft, normoactive bowel sounds, generalized tenderness to palpation, non distended  Extremities: no edema  Neuro: A&O x 3, no asterixis    Data  CBC        12/23/21  0418 12/22/21  0521   WBC 3.6 3.1*   HGB 9.0* 9.8*   HCT 26.6* 28.4*   PLT 90* 89*     Coags        12/23/21  0418 12/22/21  0521   INR 1.4* 1.3*     Chem7        12/23/21  0418 12/22/21  1749 12/22/21  0521   NA 137 137 138   K 2.8* 3.7 2.7*   CL 105 107 106   CO2 25 23 25    BUN 3* 3* 2*   CREAT 0.32* 0.34* 0.26*   GLU 108 103 82     Electrolytes        12/23/21  0418 12/22/21  1749 12/22/21  0521   CA 7.6* 7.7* 7.8*   MG 2.1 1.4*  --    PO4 2.8  --   --      Liver Panel        12/23/21  0418 12/22/21  0521   AST 68* 74*   ALT 57 64*   ALKP 491* 573*   TBILI 15.7* 16.8*   TP 3.9* 3.2*   ALB 1.3* 1.4*     TSH  No results found in last 7 days  HIV  No results found in last 7 days       CRP        12/13/21  0302   CRP 11.3*     UA  No results found in last 7 days    Microscopy:  No results found in last 7 days  @[facility]EVSF ([phone]:5:0:0:-1)@  Microbiology results - 7 Days   Microbiology Results   Date Collected Specimen Source Result     Radiology Results (last 24 hours)     Procedure Component Value Units Date/Time    CT Brain without Contrast [[id number]]     Order Status: Sent           IMAGING:    MRCP 12/08/21:   1. Cholelithiasis with mild gallbladder wall edema. Findings are disfavored to represent acute cholecystitis. Gallbladder  wall thickening can be seen in the setting of underlying liver disease or cardiac disease.   2. No choledocholithiasis or biliaryobstruction.   3. Marked hepatic steatosis.   4. Moderate bilateral pleural effusions and adjacent atelectasis.    [place] 12/08/21:   Impressions:   1. No interval change without acute intracranial abnormalities.   2. Mild senescent changes are noted without obvious acute infarct. No hemorrhage, mass or midline shift. No findingsto suggest cerebral edema.    NM Hepatobiliary 12/09/21:   1. Normal radiotracer filling of the gallbladder is identified, therefore the exam is not consistent with cystic duct obstruction or acute cholecystitis.   2. Poor radiotracer uptake and diminished radiotracer excretion by the liver compatible with sequela of hepatocellular dysfunction.    RUQ U/S 12/03/21:   FINDINGS: Liver is mildly enlarged measuring 17.4 cm. There was diffuse increased echogenicity without focal lesions. Liver capsule was smooth. No dilated intra or extrahepatic ducts, common bile duct measured 4 mm. The gallbladder contained a large shadowing stone measuring 2 cm. There were 2 gallbladder polyps measuring 6 mm and 3 mm. No gallbladder wall thickening or tenderness. Right kidney measured 11.4 cm. Normal echogenicity and no hydronephrosis. Previously seen cyst on the lower pole the right kidney was not visualized.   IMPRESSION:   1. Liver is enlarged with diffuse increased echogenicity. Finding is nonspecific but consistent with fatty infiltration.   2. Large 2 cm stone in the gallbladder, no sonographic evidence of acutecholecystitis.   3. Two gallbladder polyps measuring 6 mm and 3 mm.    TTE 12/03/21:   Conclusions:   The left ventricular cavity size is normal.   Normal LV wall thickness.   The left ventricular ejection fraction is in normal range at 68% by biplane MOD.   No LV regional wall motion abnormalities.   Normal diastolic function for age.       ENDOSCOPY:  No  results found for this or any previous visit.    PATHOLOGY:   Liver biopsy 12/11/21 -   Liver, transjugular biopsy:  1.  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  2.  Ductular reaction with focal pericholangitis; see comment.    COMMENT:  The biopsy shows severe steatosis with pericentral/sinusoidal and  periportal fibrosis, histiocytic and neutrophilic lobular inflammation,  Mallory hyaline as well as rare ballooned hepatocytes. 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  Clinical correlation with steatohepatitis risk factors is suggested,  including for any history of massive weight loss which may lead to an  aggressive onset of steatohepatitis [1].  The history of dexamethasone  use is noted; corticosteroid use has  been associated with exacerbation  of pre-existing fatty liver disease [2].    Additionally, there is ductular reaction with focal pericholangitis,  raising consideration for a duct obstructive process. Clinical  correlation is advised. The concern for other drug-induced liver injury  is noted. In particular, thalidomide derivatives (such as pomalidomide)  have been reported in association with vanishing bile duct syndrome  (VBDS) [3]; however, there is no convincing duct loss to suggest VBDS.  There are no clusters of plasma cells seen to suggest involvement by  myeloma.    Given the reported high plasma CMV levels, immunohistochemistry for CMV  will be performed and reported via addendum.      ASSESSMENT AND RECOMMENDATIONS  Ariel Braun is a 61 y.o. 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. Hepatology was consulted for work-up of elevated liver tests.      #Acute Cholestatic Liver Injury    She started daratumumab, pomylast, and dexamethasone weekly in 2021. 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 Her ALKP and bilirubin rose to 278 and 1.5, respectively on 10/13/21. During her hospitalization at [facility] for hypovolemic shock in the setting of diarrhea from norovirus, her liver tests showed an elevated ALKP in the 1000s (GGT was in the 1000s at the time as well), AST in the 140s-160s, and ALT in the 120s-180s. Her bilirubin had also steadily rose to the 10s. Today, her ALT 64, AST 74, ALP 573, and total bilirubin 16.8.    Transjugular liver biopsy was done on 12/11/21 with elevated hepatic venous pressure gradient of 8. Biopsy showed steatohepatitis with pericentral/sinusoidal and periportal fibrosis (stage 2, scale 0-4, NASH-CRN method) and ductular reaction with focal pericholangitis, which could be concerning for DILI. Biopsy did not show myleoma or duct loss to suggest vanishing bile duct syndrome. CMV stain was negative for CMV.     Overall, patient has an ongoing cholestatic liver injury (R-Factor: 0.3) of unclear etiology at this time. Extensive workup has been done thus far including lab testing, imaging and liver biopsy. 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 Imaging was done with MRCP not showing biliary obstruction and ultrasound not showing normal CBD. Liver biopsy showed steatohepatitis/focal pericholangitis and CMV stain was negative. Suspect patient has underlying NASH which explains the steatohepatitis and likely has multifactorial  acute cholestatic liver injury from hypotension/hypoperfusion (given patients liver enzymes increased during shock and patient continues to have intermittent hypotension), CMV hepatitis (given elevated CMV viral load and although biopsy was negative it would be patchy) and possible component of DILI (received multiple antibiotics received such as zosyn or CTX which both have a likelihood score of B per [employer]). Less likely due vanishing bile duct syndrome (as pathology did not show bile duct loss), PBC (negative antimitochondrial antibody), PSC (MRCP not showing any strictures), infiltrative malignancy (biopsy not suggestive of myeloma) or choledocholithiasis (MRCP negative for choledocholithiasis). 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    Recommendations:   - Daily LFTs and INR   - Avoid hypotension  - Continue IV ganciclovir 5 mg/kg q12 per ID  - Follow up TTE to rule out CHF which can predispose patient to a low flow state during periods of hypotension   - Continue Ursodiol to 600mg  twice daily  - Follow up hepatitis E       Thank you for involving [place] in [place] care, we will continue to follow with you.  Please don't hesitate to page [phone] with questions.  This patient was discussed with attending, Dr. Darl Householder on 12/23/21.    Chelsea Aus, MD  Gastroenterology Fellow

## 2021-12-23 NOTE — Nursing Note (Signed)
Patient A/Ox2 and is on EEG and video telemetry monitoring for 24 hrs. Patient started pulling out EEG wires. Obtained an order for bilateral mitten restraints. Patient family at bedside and made aware. Verbalized undestanding.

## 2021-12-23 NOTE — Consults (Signed)
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 The patient has received VRd X 4 (achieving VGPR) then Mel 200 ASCT 05/12/11. 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 She met the criteria for PD on 02/19/2020 and was started on third line DARA PD in June 2021, which was recently held on 10/20/21 due to chronic diarrhea. Patient was transferred OSH after recent hypovolemic/distributive shock. Here for acute encephalopathy workup and acute liver failure workup.    Assessment and Treatment Plan    OT Progress summary: Pt oriented to self, year and [place] today. Pt demonstarted improved verbalizations and communication during session. Pt tolerated 2 hours in chair today and required maximove transfer back to bed. Pt continues to be limited by impaired cognition, impaired motor planning and initiation. Pt p/w increased LE pain and swelling impacting pt ability to move limbs. Pt required mod A for feeding and min A for grooming. Pt more calm after sitting in chair for few minutes and demonstrated ability to use call light for RN. Pt requires max A for ADLs and remains far below baseline. Recommend placement.  Progress with OT: Slow progress  Current maximal level of assist: Maximal assist  Next session focus:      Recommendations    Recommended Discharge Disposition Placement for continued therapy   Discharge DME Recommendations Defer to next level of care;To be determined   Equipment Recommendations     DME Comment     Discharge Recommendations Comment     Rehab Potential Patient participates well in therapy and is progressing towards goal   Anticipated Assistance Available at Discharge Family;Spouse/Significant other   Anticipated Type of Assistance Available at Discharge Assist as needed   Anticipated Time of Assistance Available at Discharge     Barriers to Discharge Medical  issues;Impaired Cognition;Insufficient activity tolerance   Patient's current functional ability appropriate for D/C recommendations No     Inpatient Recommendations  OT Inpatient Recommendations     Recommendation Comments Sling to chair w/ maximove and 2p assist   Current Maximal Level of Assist Needed Maximal assist     Brace/Precautions   Brace/Orthotic/Prosthetic:No  Precautions:Has weight bearing limitation or precaution: Yes  Precautions and weight bearing status comments: delrium, falls     Subjective Report:"I can't do this"    Patient/Family Goal:     Objective Findings and Interventions    Areas of Occupation    Grooming and Light Hygiene Minimal assistance   Cueing Required     Comment Pt initiating combing hair, min A to complete. Nodding no to oral care and wash face.   Self Feeding Moderate assistance   Cueing Required     Comment 8x spoonfuls of vanilla shake   Toileting     Cueing Required     Toileting Clothing Management     Cueing Required     Comment     Upper Body Dressing     Cueing Required     Comment     Lower Body Dressing Maximal assistance   Cueing Required     Comment don socks   Upper Body Bathing     Cueing Required     Comment     Lower Body Bathing     Cueing Required     Comment     ADL/IADL Comment  Functional Transfers    Bed Mobility From Supine   Bed Mobility To Sitting EOB   Level of Assist Maximal assistance   Cueing Required     Transfer From Sit   Transfer To Stand   Level of Assist Maximal assistance   Cueing Required     Technique     Functional Transfer Comment /Supine to sit w/ HOB at 90 deg and 2p max A. Pt p/w stable BP EOB 117/75. Pt sit to stand w/ stedy and 2p max A. Pt transferred to chair, called daughter [name]. Pt tolerated light grooming and feeding, less moaning once performed feeding. BP stable in chair. Pt tolerated 2 hrs in chair, maximove back to bed.     Functional Balance for ADLs  Position Static Dynamic   Sit Static sitting level of assist:  Minimal assist-   Static sitting comment: CGA-Min A at trunk Dynamic sitting level of assist: Maximal assist-  Dynamic sitting comment: scooting anteriorly     Stand Static Standing level of assist: Supervision-   Static standing comment: Max A at trunk with BUE support on STEDY, decreased hip extension today  -            [employer]	[place] AM-PAC for ADLs  6 Click Score: 11    Client Factors                                                 OT Education  Education  Learner: Patient  Content: Plan of care;Activity recommendations  Response: Needs reinforcement  Communication between other health care provider: OT;RN    OT Plan of [place], OT  12/23/2021 4:06 PM

## 2021-12-24 LAB — BASIC METABOLIC PANEL (NA, K, CL, CO2, BUN, CR, GLU, CA)
Anion Gap: 7 (ref 4–14)
Calcium, total, Serum / Plasma: 7.8 mg/dL — ABNORMAL LOW (ref 8.4–10.5)
Carbon Dioxide, Total: 23 mmol/L (ref 22–29)
Chloride, Serum / Plasma: 107 mmol/L (ref 101–110)
Creatinine: 0.38 mg/dL — ABNORMAL LOW (ref 0.55–1.02)
Glucose, non-fasting: 103 mg/dL (ref 70–199)
Potassium, Serum / Plasma: 3.4 mmol/L — ABNORMAL LOW (ref 3.5–5.0)
Sodium, Serum / Plasma: 137 mmol/L (ref 135–145)
Urea Nitrogen, Serum / Plasma: 3 mg/dL — ABNORMAL LOW (ref 7–25)
eGFRcr: 114 mL/min/{1.73_m2} (ref >59)

## 2021-12-24 LAB — COMPREHENSIVE METABOLIC PANEL (BMP, AST, ALT, T.BILI, ALKP, TP ALB)
AST: 72 U/L — ABNORMAL HIGH (ref 5–44)
Alanine transaminase: 56 U/L (ref 10–61)
Albumin, Serum / Plasma: 1.2 g/dL — ABNORMAL LOW (ref 3.4–4.8)
Alkaline Phosphatase: 496 U/L — ABNORMAL HIGH (ref 38–108)
Anion Gap: 4 (ref 4–14)
Bilirubin, Total: 15.5 mg/dL — ABNORMAL HIGH (ref 0.2–1.2)
Calcium, total, Serum / Plasma: 7.7 mg/dL — ABNORMAL LOW (ref 8.4–10.5)
Carbon Dioxide, Total: 27 mmol/L (ref 22–29)
Chloride, Serum / Plasma: 107 mmol/L (ref 101–110)
Creatinine: 0.34 mg/dL — ABNORMAL LOW (ref 0.55–1.02)
Glucose, non-fasting: 106 mg/dL (ref 70–199)
Potassium, Serum / Plasma: 3.2 mmol/L — ABNORMAL LOW (ref 3.5–5.0)
Protein, Total, Serum / Plasma: 3.6 g/dL — ABNORMAL LOW (ref 6.3–8.6)
Sodium, Serum / Plasma: 138 mmol/L (ref 135–145)
Urea Nitrogen, Serum / Plasma: 3 mg/dL — ABNORMAL LOW (ref 7–25)
eGFRcr: 117 mL/min/{1.73_m2} (ref >59)

## 2021-12-24 LAB — COMPLETE BLOOD COUNT WITH DIFFERENTIAL
Abs Basophils: 0 10*9/L (ref 0.00–0.10)
Abs Eosinophils: 0.01 10*9/L (ref 0.00–0.40)
Abs Imm Granulocytes: 0.08 10*9/L (ref <0.10)
Abs Lymphocytes: 0.26 10*9/L — ABNORMAL LOW (ref 1.00–3.40)
Abs Monocytes: 0.1 10*9/L — ABNORMAL LOW (ref 0.20–0.80)
Abs Neutrophils: 3.37 10*9/L (ref 1.80–6.80)
Hematocrit: 24.7 % — ABNORMAL LOW (ref 36.0–46.0)
Hemoglobin: 8.4 g/dL — ABNORMAL LOW (ref 12.0–15.5)
MCH: 34.4 pg — ABNORMAL HIGH (ref 26.0–34.0)
MCHC: 34 g/dL (ref 31.0–36.0)
MCV: 101 fL — ABNORMAL HIGH (ref 80–100)
MPV: 12 fL (ref 9.1–12.6)
Platelet Count: 88 10*9/L — ABNORMAL LOW (ref 140–450)
RBC Count: 2.44 10*12/L — ABNORMAL LOW (ref 4.00–5.20)
RDW-CV: 24.4 % — ABNORMAL HIGH (ref 11.7–14.4)
WBC Count: 3.8 10*9/L (ref 3.4–10.0)

## 2021-12-24 LAB — PROTHROMBIN TIME
INR: 1.4 — ABNORMAL HIGH (ref 0.9–1.2)
PT: 16.3 s — ABNORMAL HIGH (ref 11.6–15.0)

## 2021-12-24 LAB — CYTOMEGALOVIRUS DNA, QUANTITATIVE PCR, PLASMA
CMV DNA (IU/mL): 3107 IU/mL — ABNORMAL HIGH (ref <30)
CMV DNA (Log IU/mL): 3.49 — ABNORMAL HIGH (ref <1.48)

## 2021-12-24 LAB — FIBRINOGEN, FUNCTIONAL: Fibrinogen, Functional: 152 mg/dL — ABNORMAL LOW (ref 202–430)

## 2021-12-24 LAB — POCT GLUCOSE
Glucose, Glucometer: 123 mg/dL (ref 70–199)
Glucose, Glucometer: 91 mg/dL (ref 70–199)

## 2021-12-24 MED FILL — URSODIOL 300 MG CAPSULE: 300 mg | ORAL | Qty: 2

## 2021-12-24 MED FILL — DICYCLOMINE 10 MG CAPSULE: 10 mg | ORAL | Qty: 2

## 2021-12-24 MED FILL — DILAUDID (PF) 0.5 MG/0.5 ML INJECTION SYRINGE: 0.5 mg/ mL | INTRAMUSCULAR | Qty: 0.5

## 2021-12-24 MED FILL — POTASSIUM CHLORIDE 20 MEQ/50 ML IN STERILE WATER INTRAVENOUS PIGGYBACK: 20 mEq/50 mL | INTRAVENOUS | Qty: 50

## 2021-12-24 MED FILL — POTASSIUM CHLORIDE 40 MEQ/100ML IN STERILE WATER INTRAVENOUS PIGGYBACK: 40 mEq/100 mL | INTRAVENOUS | Qty: 100

## 2021-12-24 MED FILL — FLINTSTONES COMPLETE (IRON) CHEWABLE TABLET: ORAL | Qty: 1

## 2021-12-24 MED FILL — THIAMINE HCL (VITAMIN B1) 100 MG/ML INJECTION SOLUTION: 100 mg/mL | INTRAMUSCULAR | Qty: 2

## 2021-12-24 MED FILL — ONDANSETRON HCL (PF) 4 MG/2 ML INJECTION SOLUTION: 4 mg/2 mL | INTRAMUSCULAR | Qty: 4

## 2021-12-24 MED FILL — POTASSIUM CHLORIDE 40 MEQ/100ML IN STERILE WATER INTRAVENOUS PIGGYBACK: 40 mEq/100 mL | INTRAVENOUS | Qty: 200

## 2021-12-24 MED FILL — GANCICLOVIR SODIUM 500 MG INTRAVENOUS SOLUTION: 500 mg | INTRAVENOUS | Qty: 8

## 2021-12-24 NOTE — Nursing Note (Signed)
Patient on video monitoring for pulling at EEG lines earlier (2/8 afternoon). Pt is calm and alert to name, place and situation. EEG monitoring on as well. Call light within reach. Verbalizes that she is aware how to call for assist. Mittens removed.

## 2021-12-24 NOTE — Progress Notes (Signed)
[facility] PROGRESS NOTE  ATTENDING ONLY     My date of service is 12/24/21.    24 Hour Course  - Path pending on colonic biopsies  - Neurology recs: Repeat CT brain to monitor subdural effusion, EEG (neg), thiamine iv repletion.  -Hepatology follow up: CMV stain negative. Probable liver injury from hypotension and possible exposure to drugs that are prone to liver toxicity. Increase Ursodiol to 600 mg BID, check Hepatitis e and TTE.  -IVIG for CMV treatment.    Subjective  Pt is feeling well. Going down for CT.    Vitals  Temp:  [36.6 C (97.9 F)-37.1 C (98.8 F)] 37.1 C (98.8 F)  Heart Rate:  [78-107] 94  *Resp:  [17-19] 19  BP: (92-108)/(63-76) 102/68  SpO2:  [93 %-99 %] 98 %    MostRecent Weight: 90.7 kg (199 lb 15.3 oz)  Admission Weight: 79.6 kg (175 lb 7.8 oz)      Intake/Output Summary (Last 24 hours) at 12/24/2021 1447  Last data filed at 12/24/2021 1242  Gross per 24 hour   Intake 1723 ml   Output 3750 ml   Net -2027 ml       Pain Score: Eyes closed, patient calm    Physical Exam  Appearance: NAD.      Comments: Alert and oriented X 3 slow to respond. Lacks insight    HENT:      Head: Normocephalic and atraumatic.      Nose: Nose normal.   Neck:  R IJ CVC with dressing in place, c/d/i  Eyes:      General: Scleral icterus present.      Extraocular Movements: Extraocular movements intact.      Conjunctiva/sclera: Conjunctivae normal.      Pupils: Pupils are equal, round, and reactive to light.   Cardiovascular:      Rate and Rhythm: Normal rate and regular rhythm.      Pulses: Normal pulses.      Heart sounds: Normal heart sounds. No murmur heard.    No friction rub. No gallop.   Pulmonary:      Effort: Pulmonary effort is normal. No respiratory distress.      Breath sounds: Normal breath sounds. No stridor. No wheezing, rhonchi or rales.   Abdominal:      General: Abdomen is flat. Bowel sounds are normal. There is no distension.      Palpations: Abdomen is soft. There is no mass.       Tenderness: There is no abdominal tenderness. There is no guarding or rebound.      Hernia: No hernia is present.      Comments: Foley catheter in place, urine appears dark  No suprapubic tenderness.   Musculoskeletal:         General: Normal range of motion.      Cervical back: Normal range of motion and neck supple.      Right lower leg: Edema present.      Left lower leg: Edema present.      Comments: Strength 1/5 in the bilateral lower extremities, unable to raise them to gravity.   Pitting edema 3-4+ in the bilateral lower extremities.   Anasarca in the abdomen and thighs bilaterally.    Skin:     General: Skin is warm.      Capillary Refill: Capillary refill takes less than 2 seconds.      Coloration: Skin is jaundiced.      Findings: Bruising present.  Comments: ecchymoses on the sternum   Neurological:      Mental Status: She is alert. She is disoriented.      Motor: Weakness present.       Scheduled Meds:   0.9% sodium chloride flush  10-20 mL Intravenous Q12H [place]    0.9% sodium chloride flush  3 mL Intravenous Q12H SCH    dicyclomine  20 mg Oral 4x Daily [place]    dry mouth oral rinse  5 mL Mucous Membrane 4x Daily [place]    ergocalciferol (vitamin D2)  50,000 Units Oral Q7 Days [place]    ganciclovir (CYTOVENE) IVPB  5 mg/kg (Dosing Weight) Intravenous Q12H [place]    multivitamin complete chewable  1 tablet Oral Daily [place]    potassium chloride in sterile water  40 mEq Intravenous Once    thiamine  200 mg Intravenous Q24H    ursodioL  600 mg Oral BID [place]     Continuous Infusions:   dextrose 5 % and 0.9 % sodium chloride 75 mL/hr (12/24/21 1258)     PRN Meds:   0.9% sodium chloride flush  10-20 mL Intravenous PRN    0.9% sodium chloride flush  3 mL Intravenous PRN    albuterol  2.5 mg Nebulization Q4H PRN    Or    albuterol  2.5 mg Nebulization Q4H PRN    dextrose 50%  12.5 g Intravenous Q15 Min PRN    Or    glucose  20 g Oral Q15 Min PRN    Or    juice (for hypoglycemia)  16 g Oral Q15 Min PRN     diphenhydrAMINE  50 mg Intravenous Once PRN    EPINEPHrine  0.3 mg Intramuscular Once PRN    hydrocortisone  100 mg Intravenous Once PRN    HYDROmorphone  0.3-0.8 mg Intravenous Q4H PRN    LORazepam  0.5 mg Oral Once PRN    magnesium sulfate in dextrose 5 %  2-4 g Intravenous Daily PRN    Or    magnesium sulfate in water  2-4 g Intravenous Daily PRN    metoclopramide  5-10 mg Intravenous Q8H PRN    ondansetron  8 mg Intravenous Q8H PRN    polyethylene glycol  17 g Oral Daily PRN    potassium chloride in sterile water  20-80 mEq Intravenous Daily PRN    Or    potassium chloride in sterile water  20-80 mEq Intravenous Daily PRN    Or    potassium chloride  20-80 mEq Oral Daily PRN       Data    CBC        12/24/21  0347 12/23/21  0418   WBC 3.8 3.6   HGB 8.4* 9.0*   HCT 24.7* 26.6*   PLT 88* 90*     Coags        12/24/21  0347 12/23/21  0418   INR 1.4* 1.4*     Chem7        12/24/21  0347 12/23/21  1734 12/23/21  0418   NA 138 137 137   K 3.2* 3.4* 2.8*   CL 107 107 105   CO2 _0 BUN 3* 3* 3*   CREAT 0.34* 0.38* 0.32*   GLU 106 103 108     Electrolytes        12/24/21  0347 12/23/21  1734 12/23/21  0418   CA 7.7* 7.8* 7.6*   MG  --   --  2.1   PO4  --   --  2.8     Liver Panel        12/24/21  0347 12/23/21  0418   AST 72* 68*   ALT 56 57   ALKP 496* 491*   TBILI 15.5* 15.7*   TP 3.6* 3.9*   ALB 1.2* 1.3*     Lactate  No results found in last 36 hours    Microbiology Results (last 24 hours)     Procedure Component Value Units Date/Time    Cytomegalovirus DNA, Quantitative PCR, Plasma [[id number]]  (Abnormal) Collected: 12/23/21 0418    Order Status: Completed Specimen: Not Applicable from Serum Updated: 12/24/21 1152     CMV DNA (IU/mL) 3,107 IU/mL      Comment: *SUBCRITICAL*  Performed by Real-Time PCR  Note new reference range.          CMV DNA (Log IU/mL) 3.49     Comment: Log10 IU/mL  Note new reference range.               Radiology Results  No results found.     I discussed with Dr. Bonnita Nasuti  from [facility] regarding A&P.    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 The patient has received VRd X 4 (achieving VGPR) then Mel 200 ASCT 05/12/11. 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 She met the criteria for PD on 02/19/2020 and was started on third line DARA PD in June 2021, which was recently held on 10/20/21 due to chronic diarrhea. Patient was transferred OSH after recent hypovolemic/distributive shock. Here for acute encephalopathy workup and acute liver failure workup.      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   June 2020 due to progression of M spike Revlimid was increased to 15 mg PO D1-21 every 28 days, plus dexamethasone  July 2021: started daratumumab, Pomylast 2 mg D 1-21, Q28 days, dexamethasone 12 mg PO Q weekly.   Primary oncologist: Dr. Iona Coach,  Dr. Kennon Rounds [facility]  Receiving dexamethasone 12 mg weekly for diarrhea  Receiving MT with daratumumab with pomalyst and dex, most recent dose 10/16/21, has since been on hold due to worsening diarrhea.     DATA:   09/07/2010: IgG Kappa MM  12/02/2021: Ig M <5, IgG 447, IgA 20, Ig E <2, SFLC Kappa 16.6, Lambda 1.6, Kappa/Lamda 10.38  12/14/21: IgG K on IFE, IgG 430, KLC 16.6, LLC 2.7, KLR 6.15    Chemo:   Holding Daratumumab and Pomalyst, last dose 10/20/21, given recent infection and acute liver failure.    Supportive Care:   Transfusion: Keep Hg >7, and Platelets >10.   Per daughter, has autoantibodies in blood was a difficult match. Would like to be contacted prior to transfusion.   Line: PICC placed 1/26  Outpatient Oncologist: Dr. Kennon Rounds  Dispo: TBD, after resolution of sepsis and liver failure    # Immunocompromised  #CMV  colitis  # At risk for infections due to malignancy  # Shock, likely hypovolemic vs distributive in the setting of chronic diarrhea, + norovirus infection   # Acute on chronic diarrhea, norovirus positive., diff and OP negative.   -  No prior evidence of adrenal insufficiency, AM cortisol levels normal, previously on dexamethasone 8 mg PO Qweekly, but has been held since early December. CMV PCR on 1/    Data:  - Ucx: pending  - Bcx: pending  - CT brain: No acute intracranial hemorrhage, herniation, or hydrocephalus.  - CT chest/A/P pending: Medium-sized bilateral pleural effusions with bilateral peribronchovascular groundglass opacities, suggestive of pulmonary edema. Clustered nodules in right middle lobe along the right minor fissure, which could represent mild aspiration or infection. Numerous sclerotic and mixed lytic/sclerotic lesions throughout the thoracic spine and bilateral ribs, increased in size and number compared to prior. Appearance is atypical for multiple myeloma but may represent a combination of treated, partially treated, and active disease. No acute pathologic fracture. Findings compatible with enterocolitis of infectious or inflammatory etiology. Involvement of the colon is greater than of the small bowel. Marked hepatic steatosis. Increased sclerosis of bone lesions throughout the thoracolumbar spine, bony pelvis, and proximal left femur. Appearance is atypical for multiple myeloma but may represent a combination of treated, partially treated, and active disease. No acute pathologic fracture.  - 1/29:CMV PCR 128,786VEHM  - 1/31: GI PCR panel positive only for Norovirus.   2/1: LP results: Neutrophils 6% in tube 1, Lymphocytes 88% (predominant), protein 42 (normal), glucose 36 (low), rapid HSV in CSF negative. CMV CSF PCR negative    - 2/2 CMV PCR 179,555High   - 2/2 Flex Sig Biopsies pending _0    - 2/5 [place] work up: sIL2 (1556.6 high), ferritin (948 high), TG(258 high), NKA pending _1     -2/8: CMV PCR: 3107 (sig improvement)    Plan:  - appreciate ID reccs   - Ganciclovir 5 mg/kg (1/31-). S/p one dose of IVIG on 2/7.   - GI consult: appreciate reccs: flex sig 2/2    - stop zosyn: s/p IV Zosyn 4.5 g Q8hrs (1/26-1/28, restarted 1/29 given recurrent fever-12/16/21)  - checking weekly CMV, next 2/15 _2    - OI ppx: Acyclovir IV    # Grade III neutropenia, at baseline  # Leukopenia  #Thrombocytopenia (possibly 2/2 liver disease)  Data: WBC of 2.2 on 12/05/21, on Zarxio at home  Plan: Continue to monitor    # Acute liver injury of unclear etiology  # Acute on chronic encephalopathy likely 2/2 infectious vs hepatic vs metabolic  # Hyperbilirubinemia  # Anasarca  # Hypoalbuminemia    Data:   -Steady rise in LFTs in 05/2021,  -12/02/21: AST 152--> 121, ALT 130--> 110, TB 5.4, Albumin 1.1  -Hepatitis panel negative.   -1/26:  Tbili 14.6, GGT 1160, ammonia 25  -CTAP 12/02/21: Scattered sclerotic lesions in the lumbar spine, hepatic steatosis.   -RUQ ultrasound: liver enlarged with diffuse increased echogenicity, consistent with fatty infiltration. 2cm stone in the gallbladder.   -HIDA scan: Normal radiotracer filling of the gallbladder is identified, therefore the exam is not consistent with cystic duct obstruction or acute cholecystitis.  - [place] abd 1/27: Hepatomegaly with diffusely increased echogenicity which can be seen with hepatic amyloidosis or steatosis. Please refer to the recently performed biopsy for further details. Mild perihepatic ascites. Patent hepatic vasculature with normal waveforms. Cholelithiasis without acute cholecystitis. Right pleural effusion.  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 CMV stains: Negative.  - 1/30: Serum studies: Ferritin 2380, iron serum 60, transferrin 46 (low), 93 % sat (high), HEV IgM negative, PCR forVZV negative, HSV negative,CMV+ 443,961High,EBV negative, AMA negative, Hepatitis B core Ab, surface Ab negative, Hep A IgG +/IgM negative   IgG4 pending _0      Plan:   - Hepatology recs: 2/7: Path looks unlikely for vanishing bile duct syndrome. Could be drug related. Increase Ursodiol to 600 mg BID, Check Hepatitis E, TTE.   - Consider avoiding steroid    - Daily LFT/INR  - RD consult placed. Protein shakes to use. Family requesting appetite stimulant. Potential harm than benefit.    #AMS  In the setting of liver failure, ammonia only 25. LP performed 2/1 to r/o encephalitis.     Data:   1/31: HIV, TSH, Folate, B12 WNL  1/30: CT head WNL other than sinus disease  2/1: LP results: Neutrophils 6% in tube 1, Lymphocytes 88% (predominant), protein 42 (normal), glucose 36 (low), rapid HSV in CSF negative. CMV CSF PCR negative  2/5: Brain MRI: Smooth diffuse dural thickening and enhancement as well as bilateral holohemispheric subdural effusions, which may be on the basis of diminished intracranial pressure following recent lumbar puncture.  -2/8: EEG: non-specific mild cerebral dysfunction.  -2/9: CT Brain: pending.     Plan:  - Neurology consult: 2/7: Encephalopathy likely from underlying medical disease. Delirium prevention. Avoid Ativan and try Seroquel for delirium. MRI findings is from LP. Okay to try caffiene tablets in the daytime to help with postural headache but patient is allergic to it.  -2/8: Repeat CT head to monitor the status of subdural effusion, EEG, Thiamine iv prophylaxis.    #Hypokalemia:  #Hypomagnesemia:  Replete as needed.    #Vitamin D deficiency:  -Supplements ordered.    #Moderate malnutrition:  -Seen by RD and given recs for supplements.    # History of PE, diagnosed in April 2019  - hold  home [place] for now given recent anemia and concerns for bleeding.   - monitor for signs and symptoms of worsening bleeding.     # Elevated TSH at OSH, likely 2/2 subclinical hypothyroidism  -TSH WNL on 12/15/20.      # Anxiety, emotional distress:   -Patient has stressful social situation, intellectually disabled son, and regarding underlying disease process.     #Chronic buttocks pressure wound  -Wound care consult, appreciate recs  -Position changes as tolerated, waffle cushion, etc    #VTE PPx:  Contraindicated: Anticipated procedure    Severity of Illness     During this hospitalization the patient is also being treated for:  - Sepsis (present on admission)    - Malignancy associated fatigue on admission  - Neoplasm/malignancy associated pain  - Hypoalbuminemia (present on admission)  - Fluid status: Hypovolemia on admission     - Cachexia  - Encephalopathy: Metabolic encephalopathy   --Pressure wound ruled out, treating bilateral buttocks moisture and friction damage     Code Status: FULL    James Ivanoff, MD  12/24/21

## 2021-12-24 NOTE — Consults (Signed)
NEUROLOGY CONSULT SERVICE PROGRESS NOTE    Interpreter Use Documentation  Preferred language: English  If non-English, interpreter modality: N/A - patient's preferred language is English  If interpreter used, document name/ID. If no interpreter used, specify why: N/A - patient's preferred language is English    Admit date: 12/09/2021   ID/CC: Encephalopathy    Ariel Braun is a 61 y.o. female with a history of multiple myeloma (on chemotherapy), anxiety, depression, with CMV and norovirus (taking ganciclovir) presenting with chronic diarrhea and encephalopathy. Neurology was consulted for persistent changes in mental status.     EVENTS:   EEG completed yesterday, showing no signs of seizures    SUBJECTIVE:  Patient resting in bed, tearful but cooperative with evaluation    OBJECTIVE:    Medications:  Scheduled Meds:   0.9% sodium chloride flush  10-20 mL Intravenous Q12H [place]    0.9% sodium chloride flush  3 mL Intravenous Q12H [place]    dicyclomine  20 mg Oral 4x Daily [place]    dry mouth oral rinse  5 mL Mucous Membrane 4x Daily [place]    ergocalciferol (vitamin D2)  50,000 Units Oral Q7 Days [place]    ganciclovir (CYTOVENE) IVPB  5 mg/kg (Dosing Weight) Intravenous Q12H [place]    multivitamin complete chewable  1 tablet Oral Daily [place]    potassium chloride in sterile water  40 mEq Intravenous Once    thiamine  200 mg Intravenous Q24H    ursodioL  600 mg Oral BID [place]     Continuous Infusions:   dextrose 5 % and 0.9 % sodium chloride 75 mL/hr (12/24/21 0806)     PRN Meds:.0.9% sodium chloride flush, 0.9% sodium chloride flush, albuterol **OR** albuterol, dextrose 50% **OR** glucose **OR** juice (for hypoglycemia), diphenhydrAMINE, EPINEPHrine, hydrocortisone, HYDROmorphone, LORazepam, magnesium sulfate in dextrose 5 % **OR** magnesium sulfate in water, metoclopramide, ondansetron, polyethylene glycol, potassium chloride in sterile water **OR** potassium chloride in sterile water **OR** potassium chloride      Physical  Examination:  Temp:  [36.6 C (97.9 F)-37.1 C (98.8 F)] 37.1 C (98.8 F)  Heart Rate:  [78-107] 94  *Resp:  [17-19] 19  BP: (92-108)/(63-76) 102/68     Constitutional: Appears in mild distress, tearful  EYES: + scleral icterus, conjunctiva otherwise normal  ENT: Mucus membranes moist   RESP: Breathing comfortably on room air   CV: Warm and well perfused   GI: Nondistended  MSK: No cyanosis or edema of the extremities  SKIN: No visible skin lesions or rashes  Neurologic Exam:   Mental Status/Psych: Alert and oriented to person, place but not to date; fluent speech; follows simple commands  Cranial Nerves: EOMI, facial strength and sensation intact and symmetric, tongue and palate midline. No dysarthria.  Motor Exam: No pronator drift, finger and foot tap mildly slowed but otherwise normal  Sensory: Intact to LT throughout.  Coordination: Intact FNF        LABS:  Recent Labs     12/24/21  0347 12/23/21  1734 12/23/21  0418 12/22/21  1749 12/22/21  0521   WBC 3.8  --  3.6  --  3.1*   HGB 8.4*  --  9.0*  --  9.8*   HCT 24.7*  --  26.6*  --  28.4*   PLT 88*  --  90*  --  89*   NA 138 137 137 137 138   K 3.2* 3.4* 2.8* 3.7 2.7*   CL 107 107 105 107 106  CO2 27 23 25 23 25    BUN 3* 3* 3* 3* 2*   CREAT 0.34* 0.38* 0.32* 0.34* 0.26*   GLU 106 103 108 103 82   CA 7.7* 7.8* 7.6* 7.7* 7.8*   MG  --   --  2.1 1.4*  --    PO4  --   --  2.8  --   --    PT 16.3*  --  16.2*  --  15.8*   INR 1.4*  --  1.4*  --  1.3*   AST 72*  --  68*  --  74*   ALT 56  --  57  --  64*   ALKP 496*  --  491*  --  573*   TBILI 15.5*  --  15.7*  --  16.8*   ALB 1.2*  --  1.3*  --  1.4*       Recent Labs     12/24/21  0347 12/23/21  1734 12/23/21  0418 12/22/21  1749   MG  --   --  2.1 1.4*   CA 7.7* 7.8* 7.6* 7.7*   PO4  --   --  2.8  --      Recent Labs     12/24/21  0347 12/23/21  0418 12/22/21  0521   PT 16.3* 16.2* 15.8*   INR 1.4* 1.4* 1.3*     No results for input(s): CHOL, LDL, TLDL, HDL, TG, TRID, CHOLRATIO, A1C in the last 72  hours.    Invalid input(s): THDL  Lab Results   Component Value Date    ESR 5 12/13/2021    CRP 11.3 (H) 12/13/2021    VB12 871 (H) 12/15/2021    TSH 3.98 12/15/2021       ----- CSF -----  Lab Results   Component Value Date    Glucose, CSF 36 (L) 12/16/2021    Protein, Total, CSF 42 12/16/2021    RBCs, CSF 250 (A) 12/16/2021    RBCs, CSF 9 (A) 12/16/2021    Neuts, CSF 6 (A) 12/16/2021    Lymphs, CSF 88 12/16/2021    Lymphs, CSF 100 12/16/2021     ----- INFECTIOUS -----  Lab Results   Component Value Date    HSV1 DNA Not detected 12/16/2021    HSV2 DNA Not detected 12/16/2021    Varicella Zoster DNA Not detected 12/12/2021    Varicella Zoster DNA Reference range: Not detected 12/12/2021    Varicella Zoster DNA  12/12/2021     This test was developed and its performance characteristics determined by the [facility].  It has not been cleared or approved by the U. S. Food and Drug Administration.    HIV Ag/Ab Combo NEG 12/15/2021    Hep B surf Ab, Quant 12 12/11/2021    Hep B surf Ag NEG 12/11/2021    CMV DNA (IU/mL) 3,107 (H) 12/23/2021     ----- AUTOIMMUNE / INFLAMMATORY-----  Lab Results   Component Value Date    IgM, Serum 7 (L) 10/13/2021    IgG 585 (L) 10/13/2021    IgG, serum 430 (L) 12/14/2021    Sedimentation Rate 5 12/13/2021    C-Reactive Protein 11.3 (H) 12/13/2021     ----- TOXIC / METABOLIC -----  Lab Results   Component Value Date    Ammonia 26 12/15/2021    Lactate, plasma 1.7 12/10/2021    LDH, ser/pl 493 (H) 12/10/2021    Kappa Light Chain, Serum, Free  16.6 12/14/2021    Lambda Light Chain, Serum, Free 2.7 (L) 12/14/2021    Kappa/Lambda Ratio, Serum, Free 6.15 (H) 12/14/2021     ----- ENDOCRINE -----  Lab Results   Component Value Date    Thyroid Stimulating Hormone 3.98 12/15/2021     ----- OTHER (MISC TESTS) ---  Lab Results   Component Value Date    Results (Send)  01/21/2011     Performed at [facility], [employer], [address], [place] [address]     -----  PENDING LABS ---  Pending Labs     Order Current Status    Hepatitis E Antibody, IgM In process    Natural Killer Cells, Functional In process    Surgical Pathology Specimen Source (enter 1 per line): 1. colon Bx r/o CMV In process           Microbiology results - 7 Days   Microbiology Results   Date Collected Specimen Source Result   12/23/2021  4:18 AM Cytomegalovirus DNA, Quantitative PCR, Plasma Serum  Cmv Dna, Quant. Pcr 3107   Cmv Dna, Quant. Pcr(log Iu/ml) 3.49         Pending:  Pending Labs     Order Current Status    Hepatitis E Antibody, IgM In process    Natural Killer Cells, Functional In process    Surgical Pathology Specimen Source (enter 1 per line): 1. colon Bx r/o CMV In process          IMAGING:  No results found.    ASSESSMENT/PLAN:  Ariel Braun is a 61 y.o. 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 Neurology team initially consulted for encephalopathy, most concerning for neurologic etiology. Following EEG completed yesterday, unlikely to be secondary to neurologic pathology, more likely to be secondary to toxic metabolic etiology. With improving mental status and unremarkable neuroimaging and diagnostics, neurology will sign off at this time and defer remainder of patient's management to primary team. Please reach out with any additional questions, or if patient's neurologic status continues to deteriorate or if new neurologic findings observed on exam.      Inpatient bundle:  Activity: as tolerated  Diet: Regular Diet         Nutritional status: Nutritional Assessment  Moderate Chronic disease or condition related malnutrition       DVT prophylaxis: per primary  GI prophylaxis: per primary  Bowel regimen: per primary  Access:   Patient Lines/Drains/Airways Status     Active LDAs     Name Placement date Placement time Site Days    PICC Double Lumen 12/10/21 Power Central Left;Upper Arm  1:Purple 2:Red 7 cm 12/10/21  1534  Arm  13    Indwelling Urinary Catheter 12/19/21 16 12/19/21  0550  --  5    Wound 12/19/21 Skin tear Sacrum 12/19/21  1900  Sacrum  4              Code Status: FULL    Dispo: per primary      [place] Shoshana Johal, MD   12/24/2021

## 2021-12-24 NOTE — Nursing Note (Signed)
Patient started pulling off EEG leads. A/O to name, place and situation but disoriented to date. Explained the reason of reapplying mittens, she verbalized understanding. Family members aware. Mittens reapplied.

## 2021-12-24 NOTE — Procedures (Signed)
[facility]  [address] Tel:  [phone]  [place] [address] Fax: [phone]  ______________________________________________    VIDEO-EEG Daily Report  Patient Name: Ariel Braun  MRN: [id number]  DOB: [birth year])  Room/Bed: L1105/L1105-01  Attending / Referring Physician: Seward Grater, MD    OBJECT OF RECORDING: 61 y.o. 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        STUDY START: 12/23/2021 at 1432  STUDY STOP: 12/24/2021 at [place]:  Recordings were obtained using a standard international 10-20 electrode placement in a 19-channel standard recording supplemented with a single electrocardiogram chest electrode. The recordings were obtained using a reference electrode and reformatted into bipolar montages for review. Concurrent continuous video recording was utilized. Throughout the entire monitoring evaluation, a video sitter (EEG technologist) observed the patient in real-time. The Natus/Persyst spike and seizure detection computer program was used to screen the EEG. An epilepsy fellow and the attending neurologist reviewed the entire recording including detections. They also reviewed concurrent EEG and Video during episodes of interest.    FINDINGS - DAILY SUMMARIES AND DETAILED SEIZURE DATA:   DAY 1: 12/23/2021 from 1432 to 2359  Relevant medications: No antiepileptic or general anesthetic medications.    Background: The waking background at rest is continuous, composed but slow with an admixture of largely delta, theta and alpha frequencies. The expected anterior-posterior voltage and frequency organization was poorly formed, with intermixed faster frequencies anteriorly and a symmetric and well-formed posterior  dominant alpha rhythm of 7 Hz that was reactive to eye opening. There were no interhemispheric asymmetries or areas of focal slowing.   During sleep, differentiation is observed but no sleep architecture is observed..   Interictal: No interictal epileptiform abnormalities.   Ictal/push-button events: There are no electrographic or clinical seizures.    DAY 2: 12/24/2021 from 0000 to 1024  Relevant medications: None   Background: Unchanged   Interictal: Unchanged   Ictal/push-button events: None      IMPRESSION:   This continuous Video-EEG monitoring study was abnormal due to poorly formed waking organization and mild diffuse slowing. Sleep wake differentiation is apparent. There were no interhemispheric asymmetries, areas of focal slowing, epileptiform discharges or seizures. This study is indicative of non-specific mild cerebral dysfunction.    Epilepsy Fellow(s):  Jones Broom MD   Epilepsy/EMU attending(s) and dates of service:  Inda Merlin MD, PhD, 2/8 - 12/24/2021

## 2021-12-24 NOTE — Consults (Signed)
Clinical Swallow Evaluation     History and Current Status  History of present illness: 61 year old 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 The patient has received VRd X 4 (achieving VGPR) then Mel 200 ASCT 05/12/11. 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 She met the criteria for PD on 02/19/2020 and was started on third line DARA PD in June 2021, which was recently held on 10/20/21 due to chronic diarrhea. Patient was transferred OSH after recent hypovolemic/distributive shock. Here for acute encephalopathy workup and acute liver failure workup.  SLP consulted for dysphagia evaluation.  Service reconsulted due to episodes of coughing noted with thin liquids by RN.  No changes in respiratory status noted at this time.    Swallowing history: History of dysphagia  Dysphagia history: Patient seen by SLP services during this admission, our service signed off on 1/31: Pt demonstrated suspected functional swallow with mild delayed throat clear on one trial thins likely d/t dry throat (endorsed by patient). No other s/sx asp observered across any PO trials (thins/solids). Prior coughing episodes reported from PT likely d/t AMS and fatigue. Rec continue on reg diet with thins. Rec continued 1:1 assist on all PO d/t AMS and general body fatigue iso complex medical course. SLP to s/o and d/c from services. Re-consult if needed.  Recent Imaging: CT Chest 12/14/21: 1.  Medium-sized bilateral pleural effusions with bilateral peribronchovascular groundglass opacities, suggestive of pulmonary edema.     2.  Clustered nodules in right middle lobe along the right minor fissure, which could represent mild aspiration or infection.     3.  Numerous sclerotic and mixed lytic/sclerotic lesions throughout the thoracic spine and bilateral ribs, increased in size and number compared to prior. Appearance is atypical for multiple myeloma but may represent a combination of treated,  partially treated, and active disease. No displaced pathologic fracture.  Patient Status  Positioning: Upright  Cognition: Lethargic;Restless  Communication: Functional for expression of basic wants, needs, and thoughts  Respiratory: Stable;Room air  Patient status comment(s): Required a lot of redirections to participate in trials, reported reduced appetite  Language Assistant  Interpreter Needed?: No    Oral-Peripheral Examination  Oral-Peripheral Examination  General: Non-focal/unremarkable  Lips: Unremarkable/WNL  Mandible: Unremarkable/WNL  Tongue: Unremarkable/WNL  Palate: Elevates normally on phonation  Larynx:  (Asthenic voice quality)  Oral status: Natural dentition in good repair    Swallow Evaluation     Level 0 (Thin)  Administered by: Cup;Straw;Self-fed  Oral Phase: No gross oral phase deficits  Pharyngeal Phase: Immediate cough x 1/ 10+ trials suspect related to taking a larger straw sip.                             Assessment  Suspect largely functional oropharyngeal phases of swallowing, but safety/efficiency mildly impacted due to deconditioning leading to overt signs of aspiration with large and straw sips of thin liquids, eliminated with single cup sips.  Patient demonstrated good sensory response in the form of strong volitional coughing.  Overt signs of aspiration noted in 1 trial across more than 6 ounces of thin liquids.  Patient refused trials of any solids due to reduced appetite.  Additionally, swallow eval requested due to coughing noted with thin liquids.  Patient reported that it happens occasionally.    Aspiration risk is mildly elevated due to deconditioning and lethargy, however suspect swallow physiology is largely  intact.  Additionally, no changes in respiratory status or suspicion for pulmonary compromise noted at this time.  She remains on room air.    Recommend continue level 7 regular textures with level 0 thin liquids.  Recommend continued one-on-one assist/supervision for all  p.o. due to deconditioning and lethargy with aspiration precautions.  Discussed with family and RN.  No further acute SLP services warranted.  If decline in respiratory status is noted which is suspect related to aspiration, please reconsult for modified barium swallow study.  Current Functional Level  Domain: Swallowing   Functional Oral Intake Scale  7 Total oral intake with no restrictions    Recommendations  Swallowing Recommendations  Solids: Regular  Liquids: Level 0 (Thin)  Liquid administration: No straw;Cup  Medication administration: Crushed with puree, as able  Supervision during meals: 1:1 assistance  Mealtime strategies and positioning: Upright;Small single sips;Remain upright 30 minutes after eating;Alternate solids and liquids;Small bites  Meal strategy comment(s): 1:1 assist. Only feed when ALERT and AWAKE. Oral care before all PO                Plan  Plan  Plan: Discharge from SLP services in the acute setting - Re-consult as needed                    Earleen Reaper, SLP  12/24/2021

## 2021-12-24 NOTE — Interdisciplinary (Signed)
Advance Care Planning     Spiritual Care Services    Patient's Meaning-Making/Spirituality/Religion: Catholic    Reason for Visit: Initial Visit    ______________________________________________________________________    Summary of Visit    Chaplain visited with patient at bedside. Introduced self and Investment banker, operational. Advised that chaplains are available 24/7 by request to bedside nurse. Left an informational card about [place].    Patient had just finished treatment and the nurse and technician were wrapping up. I waited and tried to assist with encouraging patient to eat. She presented as very sleepy so I old her that I was going to allow her to sleep and she grasped my hand and said, "We peay for all people" and I respondede that I pray for her healing.    ______________________________________________________________________    Spiritual Assessment    When I asked about her religion, the patient told me that she is more [place] than [place], but we were unable to unpack this because of her drowsiness. However, faith and prayer do seem important to the patient.    ______________________________________________________________________    [facility] will attempt follow-up visit within one week if possible for patient's continuing spiritual care. Chaplain will incorporate spiritual and/or religious needs in plan of care.    ______________________________________________________________________    Time Spent with Patient: 30 min    Spiritual Care Provider: Jarvis Morgan, PhD, MPA    ______________________________________________________________________      Spiritual care is available 24/7. To reach the chaplain on call at any time:  [place] or call [place] or call [place] or call [phone]

## 2021-12-25 LAB — COMPLETE BLOOD COUNT WITH DIFFERENTIAL
Abs Basophils: 0 10*9/L (ref 0.00–0.10)
Abs Eosinophils: 0.01 10*9/L (ref 0.00–0.40)
Abs Imm Granulocytes: 0.1 10*9/L — ABNORMAL HIGH (ref <0.10)
Abs Lymphocytes: 0.18 10*9/L — ABNORMAL LOW (ref 1.00–3.40)
Abs Monocytes: 0.11 10*9/L — ABNORMAL LOW (ref 0.20–0.80)
Abs Neutrophils: 2.92 10*9/L (ref 1.80–6.80)
Hematocrit: 23.4 % — ABNORMAL LOW (ref 36.0–46.0)
Hemoglobin: 8 g/dL — ABNORMAL LOW (ref 12.0–15.5)
MCH: 34.8 pg — ABNORMAL HIGH (ref 26.0–34.0)
MCHC: 34.2 g/dL (ref 31.0–36.0)
MCV: 102 fL — ABNORMAL HIGH (ref 80–100)
MPV: 12.7 fL — ABNORMAL HIGH (ref 9.1–12.6)
Platelet Count: 80 10*9/L — ABNORMAL LOW (ref 140–450)
RBC Count: 2.3 10*12/L — ABNORMAL LOW (ref 4.00–5.20)
RDW-CV: 24.3 % — ABNORMAL HIGH (ref 11.7–14.4)
WBC Count: 3.3 10*9/L — ABNORMAL LOW (ref 3.4–10.0)

## 2021-12-25 LAB — FIBRINOGEN, FUNCTIONAL: Fibrinogen, Functional: 149 mg/dL — ABNORMAL LOW (ref 202–430)

## 2021-12-25 LAB — COMPREHENSIVE METABOLIC PANEL (BMP, AST, ALT, T.BILI, ALKP, TP ALB)
AST: 67 U/L — ABNORMAL HIGH (ref 5–44)
Alanine transaminase: 55 U/L (ref 10–61)
Albumin, Serum / Plasma: 1.2 g/dL — ABNORMAL LOW (ref 3.4–4.8)
Alkaline Phosphatase: 485 U/L — ABNORMAL HIGH (ref 38–108)
Anion Gap: 4 (ref 4–14)
Bilirubin, Total: 15 mg/dL — ABNORMAL HIGH (ref 0.2–1.2)
Calcium, total, Serum / Plasma: 7.4 mg/dL — ABNORMAL LOW (ref 8.4–10.5)
Carbon Dioxide, Total: 29 mmol/L (ref 22–29)
Chloride, Serum / Plasma: 106 mmol/L (ref 101–110)
Creatinine: 0.31 mg/dL — ABNORMAL LOW (ref 0.55–1.02)
Glucose, non-fasting: 86 mg/dL (ref 70–199)
Potassium, Serum / Plasma: 2.5 mmol/L — CL (ref 3.5–5.0)
Protein, Total, Serum / Plasma: 3.4 g/dL — ABNORMAL LOW (ref 6.3–8.6)
Sodium, Serum / Plasma: 139 mmol/L (ref 135–145)
Urea Nitrogen, Serum / Plasma: 3 mg/dL — ABNORMAL LOW (ref 7–25)
eGFRcr: 120 mL/min/{1.73_m2} (ref >59)

## 2021-12-25 LAB — TYPE AND SCREEN
ABO/RH(D): O POS
Antibody Screen: NEGATIVE

## 2021-12-25 LAB — PROTHROMBIN TIME
INR: 1.3 — ABNORMAL HIGH (ref 0.9–1.2)
PT: 16 s — ABNORMAL HIGH (ref 11.6–15.0)

## 2021-12-25 LAB — TRANSTHORACIC ECHO
LVEDVi: 26.6693
LVEF by MOD Bi-plane: 67.1594
LVESVi: 8.7583

## 2021-12-25 LAB — POCT GLUCOSE
Glucose, Glucometer: 101 mg/dL (ref 70–199)
Glucose, Glucometer: 77 mg/dL (ref 70–199)

## 2021-12-25 LAB — MAGNESIUM, SERUM / PLASMA: Magnesium, Serum / Plasma: 1.6 mg/dL (ref 1.6–2.6)

## 2021-12-25 MED ORDER — POTASSIUM CHLORIDE 40 MEQ/100ML IN STERILE WATER INTRAVENOUS PIGGYBACK
40 | Freq: Once | INTRAVENOUS | Status: AC
Start: 2021-12-25 — End: 2021-12-25
  Administered 2021-12-26: 03:00:00 via INTRAVENOUS

## 2021-12-25 MED ORDER — VITAMIN E (DL, ACETATE) 45 MG (100 UNIT) CAPSULE
45100 mg (100 unit) | Freq: Every day | ORAL | Status: AC
Start: 2021-12-25 — End: 2022-01-15
  Administered 2021-12-26 – 2022-01-04 (×11): via ORAL
  Administered 2022-01-05 – 2022-01-06 (×2): 800 [IU] via ORAL
  Administered 2022-01-07 – 2022-01-08 (×2): via ORAL
  Administered 2022-01-09: 18:00:00 800 [IU] via ORAL
  Administered 2022-01-10 – 2022-01-11 (×2): via ORAL
  Administered 2022-01-12: 17:00:00 800 [IU] via ORAL
  Administered 2022-01-13 – 2022-01-15 (×3): via ORAL

## 2021-12-25 MED ORDER — ELECTROLYTE-A IV BOLUS
Freq: Once | INTRAVENOUS | Status: AC
Start: 2021-12-25 — End: 2021-12-25
  Administered 2021-12-25: 17:00:00 via INTRAVENOUS

## 2021-12-25 MED ORDER — POTASSIUM CHLORIDE 40 MEQ/100ML IN STERILE WATER INTRAVENOUS PIGGYBACK
40 | Freq: Once | INTRAVENOUS | Status: AC
Start: 2021-12-25 — End: 2021-12-25
  Administered 2021-12-25: 21:00:00 via INTRAVENOUS

## 2021-12-25 MED FILL — DICYCLOMINE 10 MG CAPSULE: 10 mg | ORAL | Qty: 2

## 2021-12-25 MED FILL — MAGNESIUM SULFATE 2 GRAM/50 ML (4 %) IN WATER INTRAVENOUS PIGGYBACK: 2 gram/50 mL (4 %) | INTRAVENOUS | Qty: 50

## 2021-12-25 MED FILL — THIAMINE HCL (VITAMIN B1) 100 MG/ML INJECTION SOLUTION: 100 mg/mL | INTRAMUSCULAR | Qty: 2

## 2021-12-25 MED FILL — URSODIOL 300 MG CAPSULE: 300 mg | ORAL | Qty: 2

## 2021-12-25 MED FILL — FLINTSTONES COMPLETE (IRON) CHEWABLE TABLET: ORAL | Qty: 1

## 2021-12-25 MED FILL — MAGNESIUM SULFATE 1 GRAM/100 ML IN DEXTROSE 5 % INTRAVENOUS PIGGYBACK: 1 g/00 mL | INTRAVENOUS | Qty: 100

## 2021-12-25 MED FILL — POTASSIUM CHLORIDE 40 MEQ/100ML IN STERILE WATER INTRAVENOUS PIGGYBACK: 40 mEq/100 mL | INTRAVENOUS | Qty: 100

## 2021-12-25 MED FILL — GANCICLOVIR SODIUM 500 MG INTRAVENOUS SOLUTION: 500 mg | INTRAVENOUS | Qty: 8

## 2021-12-25 MED FILL — DILAUDID (PF) 0.5 MG/0.5 ML INJECTION SYRINGE: 0.5 mg/ mL | INTRAMUSCULAR | Qty: 0.5

## 2021-12-25 NOTE — Progress Notes (Signed)
[facility] PROGRESS NOTE  ATTENDING ONLY     My date of service is 12/25/21.    24 Hour Course  - Colonic biopsy: Prelim: "colonic mucosa with a rare small non-necrotizing mucosal granuloma and rare CMV immunolabeling"   -Hepatology follow up: CMV stain negative. Probable liver injury from hypotension and possible exposure to drugs that are prone to liver toxicity. Increase Ursodiol to 600 mg BID, check Hepatitis e and TTE. 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  -IVIG for CMV treatment.    Subjective  Pt is feeling well. Mental status improved significantly. Otherwise no focal symptoms.    Vitals  Temp:  [36.5 C (97.7 F)-37.6 C (99.7 F)] 36.5 C (97.7 F)  Heart Rate:  [69-95] 90  *Resp:  [16-20] 17  BP: (83-109)/(55-72) 93/64  SpO2:  [94 %-98 %] 98 %    MostRecent Weight: 89.8 kg (197 lb 15.6 oz)  Admission Weight: 79.6 kg (175 lb 7.8 oz)      Intake/Output Summary (Last 24 hours) at 12/25/2021 1740  Last data filed at 12/25/2021 1513  Gross per 24 hour   Intake 3122.4 ml   Output 2870 ml   Net 252.4 ml       Pain Score: Eyes closed, patient calm    Physical Exam  Appearance: NAD.      Comments: Alert and oriented X 3 slow to respond. Lacks insight    HENT:      Head: Normocephalic and atraumatic.      Nose: Nose normal.   Neck:  R IJ CVC with dressing in place, c/d/i  Eyes:      General: Scleral icterus present.      Extraocular Movements: Extraocular movements intact.      Conjunctiva/sclera: Conjunctivae normal.      Pupils: Pupils are equal, round, and reactive to light.   Cardiovascular:      Rate and Rhythm: Normal rate and regular rhythm.      Pulses: Normal pulses.      Heart sounds: Normal heart sounds. No murmur heard.    No friction rub. No gallop.   Pulmonary:      Effort: Pulmonary effort is normal. No respiratory distress.      Breath sounds: Normal breath sounds. No stridor. No wheezing, rhonchi or rales.   Abdominal:      General: Abdomen is flat. Bowel sounds are normal. There is  no distension.      Palpations: Abdomen is soft. There is no mass.      Tenderness: There is no abdominal tenderness. There is no guarding or rebound.      Hernia: No hernia is present.      Comments: Foley catheter in place, urine appears dark  No suprapubic tenderness.   Musculoskeletal:         General: Normal range of motion.      Cervical back: Normal range of motion and neck supple.      Right lower leg: Edema present.      Left lower leg: Edema present.      Comments: Strength 1/5 in the bilateral lower extremities, unable to raise them to gravity.   Pitting edema 3-4+ in the bilateral lower extremities.   Anasarca in the abdomen and thighs bilaterally.    Skin:     General: Skin is warm.      Capillary Refill: Capillary refill takes less than 2 seconds.      Coloration: Skin is jaundiced.  Findings: Bruising present.      Comments: ecchymoses on the sternum   Neurological:      Mental Status: She is alert. She is disoriented.      Motor: Weakness present.       Scheduled Meds:   0.9% sodium chloride flush  10-20 mL Intravenous Q12H [place]    0.9% sodium chloride flush  3 mL Intravenous Q12H [facility]    dicyclomine  20 mg Oral 4x Daily [place]    dry mouth oral rinse  5 mL Mucous Membrane 4x Daily [place]    ergocalciferol (vitamin D2)  50,000 Units Oral Q7 Days [place]    ganciclovir (CYTOVENE) IVPB  5 mg/kg (Dosing Weight) Intravenous Q12H [place]    multivitamin complete chewable  1 tablet Oral Daily [place]    thiamine  200 mg Intravenous Q24H    ursodioL  600 mg Oral BID [place]    vitamin E (dl, acetate)  800 Units Oral Daily [place]     Continuous Infusions:   dextrose 5 % and 0.9 % sodium chloride 75 mL/hr (12/25/21 1112)     PRN Meds:   0.9% sodium chloride flush  10-20 mL Intravenous PRN    0.9% sodium chloride flush  3 mL Intravenous PRN    albuterol  2.5 mg Nebulization Q4H PRN    Or    albuterol  2.5 mg Nebulization Q4H PRN    dextrose 50%  12.5 g Intravenous Q15 Min PRN    Or    glucose  20 g Oral Q15 Min PRN     Or    juice (for hypoglycemia)  16 g Oral Q15 Min PRN    diphenhydrAMINE  50 mg Intravenous Once PRN    EPINEPHrine  0.3 mg Intramuscular Once PRN    hydrocortisone  100 mg Intravenous Once PRN    HYDROmorphone  0.3-0.8 mg Intravenous Q4H PRN    LORazepam  0.5 mg Oral Once PRN    magnesium sulfate in dextrose 5 %  2-4 g Intravenous Daily PRN    Or    magnesium sulfate in water  2-4 g Intravenous Daily PRN    metoclopramide  5-10 mg Intravenous Q8H PRN    ondansetron  8 mg Intravenous Q8H PRN    polyethylene glycol  17 g Oral Daily PRN    potassium chloride in sterile water  20-80 mEq Intravenous Daily PRN    Or    potassium chloride in sterile water  20-80 mEq Intravenous Daily PRN    Or    potassium chloride  20-80 mEq Oral Daily PRN       Data    CBC        12/25/21  0438   WBC 3.3*   HGB 8.0*   HCT 23.4*   PLT 80*     Coags        12/25/21  0438   INR 1.3*     Chem7        12/25/21  0438   NA 139   K 2.5*   CL 106   CO2 29   BUN 3*   CREAT 0.31*   GLU 86     Electrolytes        12/25/21  0438   CA 7.4*   MG 1.6     Liver Panel        12/25/21  0438   AST 67*   ALT 55   ALKP 485*   TBILI 15.0*  TP 3.4*   ALB 1.2*     Lactate  No results found in last 36 hours    Microbiology Results (last 24 hours)     Procedure Component Value Units Date/Time    Clostridium Difficile [[id number]] Collected: 12/25/21 1533    Order Status: Sent Specimen: Not Applicable from Stool Updated: 12/25/21 1632          Radiology Results  No results found.     I discussed with Dr. Bonnita Nasuti from [facility] regarding A&P.    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 The patient has received VRd X 4 (achieving VGPR) then Mel 200 ASCT 05/12/11. 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 She met the criteria for PD on 02/19/2020 and was started on third line DARA PD in June 2021, which was recently held on 10/20/21 due to chronic diarrhea.  Patient was transferred OSH after recent hypovolemic/distributive shock. Here for acute encephalopathy workup and acute liver failure workup.      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   June 2020 due to progression of M spike Revlimid was increased to 15 mg PO D1-21 every 28 days, plus dexamethasone  July 2021: started daratumumab, Pomylast 2 mg D 1-21, Q28 days, dexamethasone 12 mg PO Q weekly.   Primary oncologist: Dr. Iona Coach,  Dr. Kennon Rounds [place] [facility]  Receiving dexamethasone 12 mg weekly for diarrhea  Receiving MT with daratumumab with pomalyst and dex, most recent dose 10/16/21, has since been on hold due to worsening diarrhea.     DATA:   09/07/2010: IgG Kappa MM  12/02/2021: Ig M <5, IgG 447, IgA 20, Ig E <2, SFLC Kappa 16.6, Lambda 1.6, Kappa/Lamda 10.38  12/14/21: IgG K on IFE, IgG 430, KLC 16.6, LLC 2.7, KLR 6.15    Chemo:   Holding Daratumumab and Pomalyst, last dose 10/20/21, given recent infection and acute liver failure.    Supportive Care:   Transfusion: Keep Hg >7, and Platelets >10.   Per daughter, has autoantibodies in blood was a difficult match. Would like to be contacted prior to transfusion.   Line: PICC placed 1/26  Outpatient Oncologist: Dr. Kennon Rounds    Dispo: TBD, after resolution of sepsis and liver failure    # Immunocompromised  #CMV colitis  # At risk for infections due to malignancy  # Shock, likely hypovolemic vs distributive in the setting of chronic diarrhea, + norovirus infection   # Acute on chronic diarrhea, norovirus positive., diff and OP negative.   -No prior evidence of adrenal insufficiency, AM cortisol levels normal, previously on dexamethasone 8 mg PO Qweekly, but has been held since early December. CMV PCR on 1/    Data:  - Ucx: pending  - Bcx: pending  - CT  brain: No acute intracranial hemorrhage, herniation, or hydrocephalus.  - CT chest/A/P pending: Medium-sized bilateral pleural effusions with bilateral peribronchovascular groundglass opacities, suggestive of pulmonary edema. Clustered nodules in right middle lobe along the right minor fissure, which could represent mild aspiration or infection. Numerous sclerotic and mixed lytic/sclerotic lesions throughout the thoracic spine and bilateral ribs, increased in size and number compared to prior. Appearance is atypical for  multiple myeloma but may represent a combination of treated, partially treated, and active disease. No acute pathologic fracture. Findings compatible with enterocolitis of infectious or inflammatory etiology. Involvement of the colon is greater than of the small bowel. Marked hepatic steatosis. Increased sclerosis of bone lesions throughout the thoracolumbar spine, bony pelvis, and proximal left femur. Appearance is atypical for multiple myeloma but may represent a combination of treated, partially treated, and active disease. No acute pathologic fracture.  - 1/29:CMV PCR [id number]  - 1/31: GI PCR panel positive only for Norovirus.   2/1: LP results: Neutrophils 6% in tube 1, Lymphocytes 88% (predominant), protein 42 (normal), glucose 36 (low), rapid HSV in CSF negative. CMV CSF PCR negative    - 2/2 CMV PCR 179,555High   - 2/2 Flex Sig Biopsies prelim: "colonic mucosa with a rare small non-necrotizing mucosal granuloma and rare CMV immunolabeling"   - 2/5 [place] work up: sIL2 (1556.6 high), ferritin (948 high), TG(258 high), NKA pending [ ]   -2/8: CMV PCR: 4825 (sig improvement)    Plan:  - appreciate ID reccs   - Ganciclovir 5 mg/kg (1/31-). S/p one dose of IVIG on 2/7.   - GI consult: appreciate reccs: flex sig 2/2    - stop zosyn: s/p IV Zosyn 4.5 g Q8hrs (1/26-1/28, restarted 1/29 given recurrent fever-12/16/21)  - checking weekly CMV, next 2/15 [ ]   - OI ppx: Acyclovir IV    # Grade III  neutropenia, at baseline  # Leukopenia  #Thrombocytopenia (possibly 2/2 liver disease)  Data: WBC of 2.2 on 12/05/21, on Zarxio at home  Plan: Continue to monitor    # Acute liver injury of unclear etiology  # Acute on chronic encephalopathy likely 2/2 infectious vs hepatic vs metabolic  # Hyperbilirubinemia  # Anasarca  # Hypoalbuminemia    Data:   -Steady rise in LFTs in 05/2021,  -12/02/21: AST 152--> 121, ALT 130--> 110, TB 5.4, Albumin 1.1  -Hepatitis panel negative.   -1/26:  Tbili 14.6, GGT 1160, ammonia 25  -CTAP 12/02/21: Scattered sclerotic lesions in the lumbar spine, hepatic steatosis.   -RUQ ultrasound: liver enlarged with diffuse increased echogenicity, consistent with fatty infiltration. 2cm stone in the gallbladder.   -HIDA scan: Normal radiotracer filling of the gallbladder is identified, therefore the exam is not consistent with cystic duct obstruction or acute cholecystitis.  - [place] abd 1/27: Hepatomegaly with diffusely increased echogenicity which can be seen with hepatic amyloidosis or steatosis. Please refer to the recently performed biopsy for further details. Mild perihepatic ascites. Patent hepatic vasculature with normal waveforms. Cholelithiasis without acute cholecystitis. Right pleural effusion.  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 CMV stains: Negative.  - 1/30: Serum studies: Ferritin 2380, iron serum 60, transferrin 46 (low), 93 % sat (high), HEV IgM negative, PCR forVZV negative, HSV negative,CMV+ 443,961High,EBV negative, AMA negative, Hepatitis B core Ab, surface Ab negative, Hep A IgG +/IgM negative   IgG4 pending [ ]      Plan:   - Hepatology recs: 2/7: Path looks unlikely for vanishing bile duct syndrome. Could be drug related. Increase Ursodiol to 600 mg BID, Check Hepatitis E, TTE. Add Vitamin E 800U daily for  NASH.                          - Consider avoiding steroid    - Daily LFT/INR  - RD consult placed. Protein shakes to use. Family requesting appetite stimulant. Potential harm than benefit.    #AMS  In the setting of liver failure, ammonia only 25. LP performed 2/1 to r/o encephalitis.     Data:   1/31: HIV, TSH, Folate, B12 WNL  1/30: CT head WNL other than sinus disease  2/1: LP results: Neutrophils 6% in tube 1, Lymphocytes 88% (predominant), protein 42 (normal), glucose 36 (low), rapid HSV in CSF negative. CMV CSF PCR negative  2/5: Brain MRI: Smooth diffuse dural thickening and enhancement as well as bilateral holohemispheric subdural effusions, which may be on the basis of diminished intracranial pressure following recent lumbar puncture.  -2/8: EEG: non-specific mild cerebral dysfunction.  -2/9: CT Brain: pending.     Plan:  - Neurology consult: 2/7: Encephalopathy likely from underlying medical disease. Delirium prevention. Avoid Ativan and try Seroquel for delirium. MRI findings is from LP. Okay to try caffiene tablets in the daytime to help with postural headache but patient is allergic to it.  -2/8: Repeat CT head to monitor the status of subdural effusion: stable, EEG: diffuse slowing, Thiamine iv prophylaxis.  -Speech path: Regular and thin liquid. Slightly higher risk for aspiration given encephalopathy.    #Hypokalemia:  #Hypomagnesemia:  Replete as needed.    #Vitamin D deficiency:  -Supplements ordered.    #Moderate malnutrition:  -Seen by RD and given recs for supplements.    # History of PE, diagnosed in April 2019  - hold home [place] for now given recent anemia and concerns for bleeding.   - monitor for signs and symptoms of worsening bleeding.     # Elevated TSH at OSH, likely 2/2  subclinical hypothyroidism  -TSH WNL on 12/15/20.      # Anxiety, emotional distress:   -Patient has stressful social situation, intellectually disabled son, and regarding underlying disease process.     #Chronic buttocks pressure wound  -Wound care consult, appreciate recs  -Position changes as tolerated, waffle cushion, etc    #VTE PPx:  Contraindicated: Anticipated procedure    Severity of Illness     During this hospitalization the patient is also being treated for:  - Sepsis (present on admission)    - Malignancy associated fatigue on admission  - Neoplasm/malignancy associated pain  - Hypoalbuminemia (present on admission)  - Fluid status: Hypovolemia on admission     - Cachexia  - Encephalopathy: Metabolic encephalopathy   --Pressure wound ruled out, treating bilateral buttocks moisture and friction damage     Code Status: FULL    James Ivanoff, MD  12/25/21

## 2021-12-25 NOTE — Consults (Signed)
Hepatology/Liver Transplant Service Consultation Note     24 Hour Course  - LFTs slowly improving    Subjective  Patient mental status is improving and she feels more awake today. She denies any abdominal pain or fevers.     Objective  Current Facility-Administered Medications   Medication Dose Route Frequency Provider Last Rate Last Admin    0.9% sodium chloride flush injection syringe 10-20 mL  10-20 mL Intravenous Q12H [facility] Zena Amos, DO   10 mL at 12/23/21 2037    0.9% sodium chloride flush injection syringe 10-20 mL  10-20 mL Intravenous PRN Zena Amos, DO   10 mL at 12/19/21 2053    0.9% sodium chloride flush injection syringe 3 mL  3 mL Intravenous Q12H [facility] Carmine Savoy, MD   3 mL at 12/19/21 2054    0.9% sodium chloride flush injection syringe 3 mL  3 mL Intravenous PRN Carmine Savoy, MD        albuterol (PROVENTIL) 2.5 mg /3 mL (0.083 %) inhalation solution 2.5 mg  2.5 mg Nebulization Q4H PRN Vinodhini Arjunan, MD        Or    albuterol (PROVENTIL) inhalation solution 2.5 mg  2.5 mg Nebulization Q4H PRN Vinodhini Arjunan, MD        dextrose 5 % and 0.9 % sodium chloride infusion  75 mL/hr Intravenous Continuous Zena Amos, DO 75 mL/hr at 12/25/21 1112 75 mL/hr at 12/25/21 1112    dextrose 50% injection syringe 12.5 g  12.5 g Intravenous Q15 Min PRN Zena Amos, DO   12.5 g at 12/21/21 0940    Or    glucose chewable tablet 20 g  20 g Oral Q15 Min PRN Zena Amos, DO        Or    juice (for hypoglycemia) 16 g  16 g Oral Q15 Min PRN Zena Amos, DO        dicyclomine (BENTYL) capsule 20 mg  20 mg Oral 4x Daily [place] Vinodhini Arjunan, MD   20 mg at 12/25/21 1247    diphenhydrAMINE (BENADRYL) injection 50 mg  50 mg Intravenous Once PRN Vinodhini Arjunan, MD        dry mouth oral rinse (BIOTENE DRY MOUTH) solution 5 mL  5 mL Mucous Membrane 4x Daily [facility] Farrel Conners, MD   5 mL at 12/25/21 1657    EPINEPHrine (ADRENALIN) 1 mg/mL  (1 mL) injection 0.3 mg  0.3 mg Intramuscular Once PRN Vinodhini Arjunan, MD        ergocalciferol (vitamin D2) (ERGOCALCIFEROL) capsule 50,000 Units  50,000 Units Oral Q7 Days [facility] Vinodhini Arjunan, MD   50,000 Units at 12/23/21 1546    ganciclovir (CYTOVENE) 400 mg in dextrose 5% 100 mL IVPB  5 mg/kg (Dosing Weight) Intravenous Q12H [facility] Farrel Conners, MD   Ended at 12/25/21 1209    hydrocortisone sodium succinate (SOLU-CORTEF) 100 mg/2 mL injection 100 mg  100 mg Intravenous Once PRN Vinodhini Arjunan, MD        HYDROmorphone (DILAUDID) injection syringe 0.3-0.8 mg  0.3-0.8 mg Intravenous Q4H PRN Deboraha Sprang, MD   0.5 mg at 12/24/21 2312    LORazepam (ATIVAN) tablet 0.5 mg  0.5 mg Oral Once PRN Sandria Manly, MD        magnesium sulfate in dextrose 5 % 1 g/100 mL IVPB 2-4 g  2-4 g Intravenous Daily PRN Bernestine Amass, MD 200 mL/hr at 12/25/21 1702 1 g at 12/25/21 1702  Or    magnesium sulfate in water 2 gram/50 mL (4 %) IVPB 2-4 g  2-4 g Intravenous Daily PRN Bernestine Amass, MD 50 mL/hr at 12/25/21 1537 2 g at 12/25/21 1537    metoclopramide (REGLAN) injection 5-10 mg  5-10 mg Intravenous Q8H PRN Bernestine Amass, MD   10 mg at 12/12/21 2102    multivitamin complete chewable (FLINTSTONE'S COMPLETE) tablet 1 tablet  1 tablet Oral Daily [facility] Vinodhini Arjunan, MD   1 tablet at 12/25/21 1030    ondansetron (ZOFRAN) injection 8 mg  8 mg Intravenous Q8H PRN Zena Amos, DO   8 mg at 12/24/21 1353    polyethylene glycol (MIRALAX) packet 17 g  17 g Oral Daily PRN Carmine Savoy, MD        potassium chloride in sterile water 20 mEq/50 mL IVPB 20-80 mEq  20-80 mEq Intravenous Daily PRN Bernestine Amass, MD 50 mL/hr at 12/20/21 1757 20 mEq at 12/20/21 1757    Or    potassium chloride in sterile water 40 mEq/100 mL IVPB 20-80 mEq  20-80 mEq Intravenous Daily PRN Bernestine Amass, MD 50 mL/hr at 12/25/21 0852 40 mEq at 12/25/21 6237    Or    potassium chloride (KLOR-CON) ER tablet 20-80 mEq  20-80 mEq Oral Daily PRN  Bernestine Amass, MD        thiamine (VITAMIN B1) injection 200 mg  200 mg Intravenous Q24H Vinodhini Arjunan, MD   200 mg at 12/25/21 1243    ursodioL (ACTIGALL) capsule 600 mg  600 mg Oral BID [place] Vinodhini Arjunan, MD   600 mg at 12/25/21 1030       Vitals  Temp:  [36.5 C (97.7 F)-37.6 C (99.7 F)] 36.5 C (97.7 F)  Heart Rate:  [69-95] 90  *Resp:  [16-20] 17  BP: (83-109)/(55-72) 93/64  General: resting in bed  HEENT: NC/AT, scleral icterus present   GI: obese, soft, normoactive bowel sounds, generalized tenderness to palpation, non distended  Extremities: no edema  Neuro: A&O x 3, no asterixis    Data  CBC        12/25/21  0438   WBC 3.3*   HGB 8.0*   HCT 23.4*   PLT 80*     Coags        12/25/21  0438   INR 1.3*     Chem7        12/25/21  0438   NA 139   K 2.5*   CL 106   CO2 29   BUN 3*   CREAT 0.31*   GLU 86     Electrolytes        12/25/21  0438   CA 7.4*   MG 1.6     Liver Panel        12/25/21  0438   AST 67*   ALT 55   ALKP 485*   TBILI 15.0*   TP 3.4*   ALB 1.2*     TSH  No results found in last 7 days  HIV  No results found in last 7 days       CRP        12/13/21  0302   CRP 11.3*     UA  No results found in last 7 days    Microscopy:  No results found in last 7 days  @FEVSF ([phone]:5:0:0:-1)@  Microbiology results - 7 Days   Microbiology Results   Date Collected Specimen Source Result   12/23/2021  4:18 AM Cytomegalovirus DNA, Quantitative PCR, Plasma Serum  Cmv Dna, Quant. Pcr 3107   Cmv Dna, Quant. Pcr(log Iu/ml) 3.49       Radiology Results (last 24 hours)     ** No results found for the last 24 hours. **          IMAGING:    MRCP 12/08/21:   1. Cholelithiasis with mild gallbladder wall edema. Findings are disfavored to represent acute cholecystitis. Gallbladder wall thickening can be seen in the setting of underlying liver disease or cardiac disease.   2. No choledocholithiasis or biliaryobstruction.   3. Marked hepatic steatosis.   4. Moderate bilateral pleural effusions and adjacent  atelectasis.    [place] 12/08/21:   Impressions:   1. No interval change without acute intracranial abnormalities.   2. Mild senescent changes are noted without obvious acute infarct. No hemorrhage, mass or midline shift. No findingsto suggest cerebral edema.    NM Hepatobiliary 12/09/21:   1. Normal radiotracer filling of the gallbladder is identified, therefore the exam is not consistent with cystic duct obstruction or acute cholecystitis.   2. Poor radiotracer uptake and diminished radiotracer excretion by the liver compatible with sequela of hepatocellular dysfunction.    RUQ U/S 12/03/21:   FINDINGS: Liver is mildly enlarged measuring 17.4 cm. There was diffuse increased echogenicity without focal lesions. Liver capsule was smooth. No dilated intra or extrahepatic ducts, common bile duct measured 4 mm. The gallbladder contained a large shadowing stone measuring 2 cm. There were 2 gallbladder polyps measuring 6 mm and 3 mm. No gallbladder wall thickening or tenderness. Right kidney measured 11.4 cm. Normal echogenicity and no hydronephrosis. Previously seen cyst on the lower pole the right kidney was not visualized.   IMPRESSION:   1. Liver is enlarged with diffuse increased echogenicity. Finding is nonspecific but consistent with fatty infiltration.   2. Large 2 cm stone in the gallbladder, no sonographic evidence of acutecholecystitis.   3. Two gallbladder polyps measuring 6 mm and 3 mm.    TTE 12/03/21:   Conclusions:   The left ventricular cavity size is normal.   Normal LV wall thickness.   The left ventricular ejection fraction is in normal range at 68% by biplane MOD.   No LV regional wall motion abnormalities.   Normal diastolic function for age.       ENDOSCOPY:  No results found for this or any previous visit.    PATHOLOGY:   Liver biopsy 12/11/21 -   Liver, transjugular biopsy:  1.  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  2.  Ductular reaction with  focal pericholangitis; see comment.    COMMENT:  The biopsy shows severe steatosis with pericentral/sinusoidal and  periportal fibrosis, histiocytic and neutrophilic lobular inflammation,  Mallory hyaline as well as rare ballooned hepatocytes. 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  Clinical correlation with steatohepatitis risk factors is suggested,  including for any history of massive weight loss which may lead to an  aggressive onset of steatohepatitis [1].  The history of dexamethasone  use is noted; corticosteroid use has been associated with exacerbation  of pre-existing fatty liver disease [2].    Additionally, there is ductular reaction with focal pericholangitis,  raising consideration for a duct obstructive process. Clinical  correlation is advised. The concern for other drug-induced liver injury  is noted. In particular, thalidomide derivatives (such as pomalidomide)  have been reported in association with vanishing bile  duct syndrome  (VBDS) [3]; however, there is no convincing duct loss to suggest VBDS.  There are no clusters of plasma cells seen to suggest involvement by  myeloma.    Given the reported high plasma CMV levels, immunohistochemistry for CMV  will be performed and reported via addendum.      ASSESSMENT AND RECOMMENDATIONS  Ariel Braun is a 61 y.o. 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 Hepatology was consulted for work-up of elevated liver tests.      #Acute Cholestatic Liver Injury    She started daratumumab, pomylast, and  dexamethasone weekly in 2021. 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 Her ALKP and bilirubin rose to 278 and 1.5, respectively on 10/13/21. During her hospitalization at [facility] for hypovolemic shock in the setting of diarrhea from norovirus, her liver tests showed an elevated ALKP in the 1000s (GGT was in the 1000s at the time as well), AST in the 140s-160s, and ALT in the 120s-180s. Her bilirubin had also steadily rose to the 10s and peaked at 16.8. Today, liver enzymes are improving her ALT 65, AST 67, ALP 485, and total bilirubin 15.0.    Transjugular liver biopsy was done on 12/11/21 with elevated hepatic venous pressure gradient of 8. Biopsy showed steatohepatitis with pericentral/sinusoidal and periportal fibrosis (stage 2, scale 0-4, NASH-CRN method) and ductular reaction with focal pericholangitis, which could be concerning for DILI. Biopsy did not show myleoma or duct loss to suggest vanishing bile duct syndrome. CMV stain was negative for CMV.     Overall, patient has an ongoing cholestatic liver injury (R-Factor: 0.3) of unclear etiology at this time. Extensive workup has been done thus far including lab testing, imaging and liver biopsy. 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 Imaging was done with MRCP not showing biliary obstruction and ultrasound not showing normal CBD. Liver biopsy showed steatohepatitis/focal pericholangitis and CMV stain was negative. Suspect patient has underlying NASH which explains the steatohepatitis and likely has multifactorial acute cholestatic liver injury from hypotension/hypoperfusion (given patients liver enzymes increased during shock and patient continues to have intermittent hypotension) and  possible component of DILI (received multiple antibiotics received such as zosyn or CTX which both have a likelihood score of B per [employer]). Less likely due vanishing bile duct syndrome (as pathology did not show bile duct loss), PBC (negative antimitochondrial antibody), PSC (MRCP not showing any strictures), infiltrative malignancy (biopsy not suggestive of myeloma) or choledocholithiasis (MRCP negative for choledocholithiasis). 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    Recommendations:   - Daily LFTs and INR   - Avoid hypotension  - Continue IV ganciclovir 5 mg/kg  q12 per ID  - Continue Ursodiol to 600mg  twice daily  - Recommend starting vitamin E 800mg  daily for NASH      Thank you for involving [place] in [place] care, we will continue to follow with you.  Please don't hesitate to page [phone] with questions.  This patient was discussed with attending, Dr. Murvin Natal on 12/25/21.    Chelsea Aus, MD  Gastroenterology Fellow

## 2021-12-25 NOTE — Consults (Signed)
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 The patient has received VRd X 4 (achieving VGPR) then Mel 200 ASCT 05/12/11. 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 She met the criteria for PD on 02/19/2020 and was started on third line DARA PD in June 2021, which was recently held on 10/20/21 due to chronic diarrhea. Patient was transferred OSH after recent hypovolemic/distributive shock. Here for acute encephalopathy workup and acute liver failure workup    ASSESSMENT  Patient oriented to self and place only, and demonstrates significantly impaired cognition. .  She demonstrates decreased effort with transfers requiring Max A x 2 to stand from EOB with STEDY and Max A to stand from flaps of STEDY and control descent to the wheelchair, and also refuses to perform mobility tasks. She will benefit from skilled PT and placement for continued therapy to decrease caregiver burden with functional mobility.    Focus next session: upright tolerance, OOB, therex    RECOMMENDATIONS  DISCHARGE RECOMMENDATION  Comments Placement with ongoing PT needs      Patient Current Functional Status Sufficient for PT Discharge Recommendation Yes   Discharge DME needs TBD pending patient progress.   Discharge transportation needs [facility] Potential Patient participates well in therapy and progressing towards goals     NURSING RECOMMENDATIONS  Inpatient Rehab Assistive Device Recommendation Vertical dependent lift;Ceiling lift   Activity Recommendation Bed in chair position, OOB to chair with ceiling lift     SUBJECTIVE  Subjective report:  "You're trying to trick me."  Notable observations:  Supine in bed with HOB elevated at beginning of session. Seated in wheelchair at end of session in new room with sling underneath.    SYSTEMS REVIEW  Cognition/Communication  Delirium screen:  Yes Delirium Screen:   Details:   Encephalopathic    Communication  Cognition/communication impaired:  Yes  [place] Agitation Sedation Scale: (0) Alert or calm  Orientation: Disoriented to time, Disoriented to event  Communication: Difficulty making basic needs known, Difficulty making complex needs known, Expressive deficits  Cognition: Decreased attention, Delayed response, Impaired motor planning, Decreased safety awareness, Decreased insight to deficits, Decreased command following  Cognition comment: Able to consistently follow one step commands when she chooses too. Needing repetition of the plan for therapy.  Behavior: Self-limiting, Anxious  Behavior comment: Fearful of falling. Also, refusing to participate at times.    Integumentary  Integumentary deficits:  YesIntegumentary deficit detail:  Jaundiced, edematous, wounds at buttocks, foley    Cardiopulmonary  Cardiopulmonary deficits:  Yes  Detail:  Decreased activity tolerance    Musculoskeletal  Musculoskeletal deficits:  Yes  Abnormal strength findings: Bilateral shoulder flexion 3-/5 bilaterally, elbow flexion at least 3/5 bilaterally. Hip flexion 2+/5 bilaterally, knee extension 2+/5 bilaterally, ankle DF 3-/5 bilaterally    Neuromuscular  Neuromuscular deficits:  Yes  Motor control or coordination: Impaired at bilateral lower extremities >> bilateral upper extremities        Pain     Currently in pain: Yes  Pain location: LE pain with pressure  Pain scale: Wong-Baker     Pain Level Upon Arrival: Hurts a little bit  Highest Pain Level During Therapy: Hurts little more  Pain Level End of Therapy: Hurts a little bit    COMPREHENSIVE MOVEMENT ANALYSIS/TREATMENT  Precautions/WB status: Yes  Precautions and weight bearing status comments: delrium, falls  Hemodynamic response:   Normal hemodynamic response: Yes  Response:     Comments: BP stable throughout      Functional Mobility  Requires second person/additional health care providers: Yes  Type additional health care provider:  OT    Bed mobility Current Initial    Rolling  Level of assist  Maximal assist  Maximal assist (12/15/21 0932)   Device   Log roll (12/15/21 0932)   Intervention     Rolling comments:  Incontinent of BM prior to session    Bed mobility Current Initial    Supine < > Sit  Level of assist  Maximal assist;Two person assist  Dependent assist;Two person assist (12/15/21 0932)   Device   Through long sitting;Bed rail;Head of bed elevated (12/13/21 1430)   Intervention   Verbal cues;Tactile cues;External cues (12/13/21 1430)   Supine<>sit comments:   Max A x 2 supine>sit via L sidelying    Transfer Current Initial   Sit < > Stand  Level of assist  Maximal assist;Two person assist  Maximal assist;Two person assist (12/16/21 0930)   Device     Intervention     Transfer  Level of assist  From: Chair  To: Bed   Dependent assist (12/18/21 8756)   Device   Vertical dependent lift (12/18/21 4332)   Intervention   Verbal cues;Tactile cues (12/18/21 0938)   Sit < > Stand comments:  from elevated bed height, patient with decreased effort. Max encouragement to participate. Max A to stand from flaps of STEDY. Patient stating "I feel faint!". BP stable.  Transfer comments:      Balance  Balance deficits noted: Yes  Functional Balance for ADLs  Position Static Dynamic   Sit Static sitting level of assist: Moderate assist-   Static sitting comment: Min-Mod A at trunk. Patient telling therapist to "stop pushing me", however Pt leaning posteriorly while seated EOB Dynamic sitting level of assist: Maximal assist-  Dynamic sitting comment: scooting anteriorly     Stand Static Standing level of assist: Maximal assist-   Static standing comment: Max A at trunk with BUE support on STEDY, decreased hip extension  -        Exercise/other interventions  Supine exercises comment (other exercise, details): ankle pumps  Other exercise: Extensive education on importance of upright activity and OOB activity to improve patient's functional  mobility    Communication between other health care providers: Communication between other health care provider: OT;RN  Communication comment:       Education assessment  Learner: Patient  Content: Plan of care, Activity recommendations  Response: Needs reinforcement  Outcome measures   Physical Therapist Global Assessment of Mobility  Activity Achieved: Active stand with any level of assistance  AMPAC 6-clicks basic mobility score: 9      PLAN  Plan of care status:  Current plan of care remains appropriate  PT frequency:  5x/week  PT duration:  4 weeks.  Comment:  POC expires 2/26      Planned PT interventions:   Specific interventions: Progressive functional mobility training;Gait training;Balance training;NM Re-ed;Aerobic training;Ther ex  Education interventions: Caregiver training;Benefits of activity;Self-pacing/breathing;Exercise program;Mobility with precautions compliance;Fall risk reduction  Comment:          Leotis Shames, PT, DPT, NCS    12/25/2021

## 2021-12-25 NOTE — Consults (Addendum)
INFECTIOUS DISEASES FOLLOW UP CONSULT NOTE  ATTENDING ONLY     My date of service is 12/25/2021.    Interim History since previous Infectious Disease note  - Afebrile since 1/28  - 12/23/21 CMV PCR returned 3,107  - WBC up to 3.8, LFTs slightly downtrending  - Recurrent diarrhea with 6 small loose stools overnight   - Flex sig path with "colonic mucosa with a rare small non-necrotizing mucosal granuloma and rare CMV immunolabeling" --> cannot rule out norovirus     Subjective  - No fevers, abdominal pain, SOB    Medications  Scheduled Meds:   0.9% sodium chloride flush  10-20 mL Intravenous Q12H [place]    0.9% sodium chloride flush  3 mL Intravenous Q12H [place]    dicyclomine  20 mg Oral 4x Daily [place]    dry mouth oral rinse  5 mL Mucous Membrane 4x Daily [place]    ergocalciferol (vitamin D2)  50,000 Units Oral Q7 Days [place]    ganciclovir (CYTOVENE) IVPB  5 mg/kg (Dosing Weight) Intravenous Q12H [place]    multivitamin complete chewable  1 tablet Oral Daily [place]    potassium chloride in sterile water  40 mEq Intravenous Once    thiamine  200 mg Intravenous Q24H    ursodioL  600 mg Oral BID [place]     Continuous Infusions:   dextrose 5 % and 0.9 % sodium chloride 75 mL/hr (12/24/21 0806)       Antibiotic History  Current  Ganciclovir 1/31-    Prior  Pip-tazo 1/18-1/22, 1/24-1/27, 1/29-1/31  Micafungin 1/31-2/2  CTX 1/19  Vanc 1/18-1/19    Ppx:  Levofloxacin    Vitals  Temp:  [36.6 C (97.9 F)-37 C (98.6 F)] 36.7 C (98.1 F)  Heart Rate:  [78-107] 91  *Resp:  [17-18] 17  BP: (92-108)/(62-76) 108/71  SpO2:  [93 %-99 %] 97 %      Intake/Output Summary (Last 24 hours) at 12/24/2021 0901  Last data filed at 12/24/2021 0537  Gross per 24 hour   Intake 2458 ml   Output 3150 ml   Net -692 ml       Physical Examination  Constitutional: In bed, eyes open  HENT: NCAT.  MMM  Eyes: Scleral icterus, EOM normal  Neck: Neck supple.  Cardiovascular: Normal rate, regular rhythm, S1/S2 normal.  No murmurs or friction rub.  Pulmonary/Chest: No  respiratory distress. CTAB.  No wheezes or rhonchi.   GI: Soft. +BS.  No distension, tenderness, or rebound. No organomegaly.  Extremities: No clubbing, cyanosis, or edema.  Neurological: Awake, conversant  Skin:Jaundiced No rashes, lesions, or peripheral stigmata of endocarditis.   Psychiatric: Normal mood and affect.     Lines/tubes: LUE PICC    Data    CBC        12/24/21  0347 12/23/21  0418 12/22/21  0521   WBC 3.8 3.6 3.1*   HGB 8.4* 9.0* 9.8*   HCT 24.7* 26.6* 28.4*   PLT 88* 90* 89*   NEUTA 3.37 3.19 2.68   LYMA 0.26* 0.21* 0.24*   MOA 0.10* 0.09* 0.08*   EOA 0.01 0.00 0.01   BASOA 0.00 0.00 0.01       Chem7        12/24/21  0347 12/23/21  1734 12/23/21  0418   NA 138 137 137   K 3.2* 3.4* 2.8*   CL 107 107 105   CO2 27 23 25    BUN 3* 3* 3*  CREAT 0.34* 0.38* 0.32*   GLU 106 103 108       Liver Panel        12/24/21  0347 12/23/21  0418 12/22/21  0521 12/11/21  0328 12/10/21  0208   AST 72* 68* 74*   < > 101*   ALT 56 57 64*   < > 116*   ALKP 496* 491* 573*   < > 745*   TBILI 15.5* 15.7* 16.8*   < > 14.6*   TP 3.6* 3.9* 3.2*   < > 3.2*   ALB 1.2* 1.3* 1.4*   < > 2.0*   GGT  --   --   --   --  1,160*    < > = values in this interval not displayed.       CRP        12/13/21  0302   CRP 11.3*       Microbiology  [place]  12/23/21 CMV PCR 3,107  2/1 CSF   0 WBCs, normal protein, glucose low at 36.  HSV PCR neg  CMV PCR neg  2/1 C diff neg  2/1 GI PCR + norovirus  2/1 CMV PCR 179,555   1/30 UCx >100K C albicans  1/29 UCx >100K C albicans  1/29 cBCx x2 NG  1/29 CMV PCR 676,720  1/29 Hep E IgM neg  1/29 HBeAb neg  1/28 VZV PCR neg  1/28 HSV PCR neg  1/27 HAV IgG positive  1/27 EBV PCR neg  1/27 HBsAg neg, sAb 12  1/26 pBCx x2 NG  1/26 MRSA swab neg  1/26 C19 PCR neg  1/26 UCx >100K C [place]    OSH  1/18 GI PCR: +norovirus  1/18: flu, RSV, covid neg  1/22: CMV PCR positive  1/22: EBV PCR neg  1/24: blood cx x2: neg  1/25: VZV PCR: neg    Radiology Results  No results found.     Pathology  12/17/21 Flex sig path:   FINAL  PATHOLOGIC DIAGNOSIS  Colon, biopsy:  Colonic mucosa with a rare small non-necrotizing mucosal  granuloma and rare CMV immunolabeling; see comment.   COMMENT:   Sections show colonic mucosa with a rare small non-necrotizing  granuloma, but no active colitis.  There is relatively depleted lamina  propria inflammatory cells and no overtly atypical plasma cells,  organisms, or viropathic change.  An initial CMV immunostain shows rare  luminal staining, possibly artifactual, but on repeat CMV stain there is  more convincing labeling of rare epithelial cells, and possibly  endothelial cells and/or lymphocytes, to raise consideration for a CMV  contribution to diarrhea (though a norovirus infection cannot be  excluded).  [place], [name], and GMS stains are negative for organisms.   Adenovirus immunostain is negative.  Kappa/lambda immunostains show  ~2-3:1 ratio (K:L) supporting a polytypic plasma cell     12/11/21 Liver biopsy:  FINAL PATHOLOGIC DIAGNOSIS  Liver, transjugular biopsy:  1.  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  2.  Ductular reaction with focal pericholangitis; see comment.  COMMENT:  The biopsy shows severe steatosis with pericentral/sinusoidal and  periportal fibrosis, histiocytic and neutrophilic lobular inflammation,  Mallory hyaline as well as rare ballooned hepatocytes. 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  Clinical correlation with steatohepatitis risk factors is suggested,  including for any history of massive weight loss which may lead to an  aggressive onset of steatohepatitis [1].  The history of  dexamethasone  use is noted; corticosteroid use has been associated with exacerbation  of pre-existing fatty liver disease [2].  Additionally, there is ductular reaction with focal pericholangitis,  raising consideration for a duct obstructive process. Clinical  correlation is advised. The concern for other  drug-induced liver injury  is noted. In particular, thalidomide derivatives (such as pomalidomide)  have been reported in association with vanishing bile duct syndrome  (VBDS) [3]; however, there is no convincing duct loss to suggest VBDS.  There are no clusters of plasma cells seen to suggest involvement by  myeloma.  Addendum Comment  In order to further evaluate this case, immunohistochemistry for CMV was  performed and evaluated on block A1 as follows:  - CMV:          Negative.      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 Other work-up notable for liver bx CMV IHC neg, CSF PCR neg. On ganciclovir since 1/31 with improvement of diarrhea, last VL downtrending to 3K 2/8.    # Diarrhea with CT + enterocolitis: Suspect multifactorial with norovirus and CMV involvement. Initially improved but now recurred overnight for which ddx is prior infection vs new. Agree with sending C diff. If negative, would repeat GI PCR panel to check for norovirus - if still PCR positive and diarrhea persists, may consider therapy.    # Encephalopathy: Low suspicion for infection as LP without pleocytosis and negative CMV PCR; MRI Brain without acute findings. Per Neuro, most c/w toxic metabolic.     # Elevated LFTs: 1/27 liver biopsy + steatosis, CMV IHC neg. Per Hepatology, NASH + hypoperfusion + cholestasis.    Dx  - Weekly CMV PCR until low level <300 x1  - Agree with sending C diff  If negative repeat stool GI PCR panel  Rx  - ContinueIV ganciclovir 5 mg/kg q12  Plan to transition to PO valganciclovir once diarrhea stably  resolved    We will continue to follow with you.  Please contact [facility]st Call via Voalte with questions.    Murlean Hark, MD  12/25/2021    I spent a total time of 55 minutes in this episode of care preparing to see the patient, obtaining and/or reviewing separately obtained history, performing a medically appropriate examination and/or evaluation, counseling and educating patient/family/caregiver, ordering and reviewing medications, tests, or procedures, referring and communicating with other health care professionals, documenting clinical information in the electronic or other health record, and communicating results to the patient/family/caregiver, and/or other care coordination.

## 2021-12-25 NOTE — Consults (Signed)
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 The patient has received VRd X 4 (achieving VGPR) then Mel 200 ASCT 05/12/11. 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 She met the criteria for PD on 02/19/2020 and was started on third line DARA PD in June 2021, which was recently held on 10/20/21 due to chronic diarrhea. Patient was transferred OSH after recent hypovolemic/distributive shock. Here for acute encephalopathy workup and acute liver failure workup.    Assessment and Treatment Plan    OT Progress summary: Pt alert, more converstant during session but still very altered mental status. Pt unable to immediately recall getting cleaned up, pt unable to recall plan for changing rooms. Pt continues to be confused and perseverating on needing to lie down. Pt required 2p max A EOB and mod A for static sitting balance. Pt max A of 2p transfer to w/c to change rooms. Pt tolerated 40 minutes in w/c and dependent transfer back to bed. Recommend placement.  Progress with OT: Slow progress  Current maximal level of assist: Maximal assist  Next session focus:      Recommendations    Recommended Discharge Disposition Placement for continued therapy   Discharge DME Recommendations Defer to next level of care;To be determined   Equipment Recommendations     DME Comment     Discharge Recommendations Comment     Rehab Potential Patient participates well in therapy and is progressing towards goal   Anticipated Assistance Available at Discharge Family;Spouse/Significant other   Anticipated Type of Assistance Available at Discharge Assist as needed   Anticipated Time of Assistance Available at Discharge     Barriers to Discharge Medical issues;Impaired Cognition;Insufficient activity tolerance   Patient's current functional ability appropriate for D/C recommendations No     Inpatient  Recommendations  OT Inpatient Recommendations     Recommendation Comments Sling to chair w/ maximove and 2p assist   Current Maximal Level of Assist Needed Maximal assist     Brace/Precautions   Brace/Orthotic/Prosthetic:   Precautions:Has weight bearing limitation or precaution: Yes  Precautions and weight bearing status comments: delrium, falls     Subjective Report:"I need to be cleaned up"    Patient/Family Goal:     Objective Findings and Interventions    Areas of Occupation    Grooming and Light Hygiene     Cueing Required     Comment     Self Feeding     Cueing Required     Comment     Toileting Maximal assistance   Cueing Required     Toileting Clothing Management Maximal assistance   Cueing Required     Comment incontinent of stool max A pericare   Upper Body Dressing Moderate assistance   Cueing Required     Comment doff dirty gown, don new gown   Lower Body Dressing     Cueing Required     Comment     Upper Body Bathing     Cueing Required     Comment     Lower Body Bathing     Cueing Required     Comment     ADL/IADL Comment         Functional Transfers    Bed Mobility From Supine   Bed Mobility To Sitting EOB   Level of Assist Maximal assistance  Cueing Required     Transfer From Sit   Transfer To Stand   Level of Assist Maximal assistance   Cueing Required     Technique     Functional Transfer Comment Supine to sit w/ 2p max A. Sit to stand w/ stedy 2p max A. Pt dependently wheeled in w/c around unit. Pt dependent transfer back to bed 2p assist via ceiling lift.         [place] for ADLs  6 Click Score: 11    Client Factors  Cognitive deficits noted in the following area(s):: Orientation, Attention, Memory  Cognition comment:Oriented to self/Jeff not year/month/date Pt very delayed processing max cueing to initiate any movement                                                   OT Education       OT Plan of [place], OT  12/25/2021 3:05 PM

## 2021-12-26 LAB — COMPREHENSIVE METABOLIC PANEL (BMP, AST, ALT, T.BILI, ALKP, TP ALB)
AST: 81 U/L — ABNORMAL HIGH (ref 5–44)
Alanine transaminase: 57 U/L (ref 10–61)
Albumin, Serum / Plasma: 1.2 g/dL — ABNORMAL LOW (ref 3.4–4.8)
Alkaline Phosphatase: 507 U/L — ABNORMAL HIGH (ref 38–108)
Anion Gap: 7 (ref 4–14)
Bilirubin, Total: 15.5 mg/dL — ABNORMAL HIGH (ref 0.2–1.2)
Calcium, total, Serum / Plasma: 7.4 mg/dL — ABNORMAL LOW (ref 8.4–10.5)
Carbon Dioxide, Total: 23 mmol/L (ref 22–29)
Chloride, Serum / Plasma: 108 mmol/L (ref 101–110)
Creatinine: 0.35 mg/dL — ABNORMAL LOW (ref 0.55–1.02)
Glucose, non-fasting: 89 mg/dL (ref 70–199)
Potassium, Serum / Plasma: 3.2 mmol/L — ABNORMAL LOW (ref 3.5–5.0)
Protein, Total, Serum / Plasma: 3.5 g/dL — ABNORMAL LOW (ref 6.3–8.6)
Sodium, Serum / Plasma: 138 mmol/L (ref 135–145)
Urea Nitrogen, Serum / Plasma: 3 mg/dL — ABNORMAL LOW (ref 7–25)
eGFRcr: 116 mL/min/{1.73_m2} (ref >59)

## 2021-12-26 LAB — COMPLETE BLOOD COUNT WITH DIFFERENTIAL
Abs Basophils: 0.01 10*9/L (ref 0.00–0.10)
Abs Eosinophils: 0 10*9/L (ref 0.00–0.40)
Abs Imm Granulocytes: 0.12 10*9/L — ABNORMAL HIGH (ref <0.10)
Abs Lymphocytes: 0.21 10*9/L — ABNORMAL LOW (ref 1.00–3.40)
Abs Monocytes: 0.12 10*9/L — ABNORMAL LOW (ref 0.20–0.80)
Abs Neutrophils: 3.88 10*9/L (ref 1.80–6.80)
Hematocrit: 25.1 % — ABNORMAL LOW (ref 36.0–46.0)
Hemoglobin: 8.3 g/dL — ABNORMAL LOW (ref 12.0–15.5)
MCH: 34 pg (ref 26.0–34.0)
MCHC: 33.1 g/dL (ref 31.0–36.0)
MCV: 103 fL — ABNORMAL HIGH (ref 80–100)
MPV: 12.8 fL — ABNORMAL HIGH (ref 9.1–12.6)
Platelet Count: 86 10*9/L — ABNORMAL LOW (ref 140–450)
RBC Count: 2.44 10*12/L — ABNORMAL LOW (ref 4.00–5.20)
RDW-CV: 24.4 % — ABNORMAL HIGH (ref 11.7–14.4)
WBC Count: 4.3 10*9/L (ref 3.4–10.0)

## 2021-12-26 LAB — BASIC METABOLIC PANEL (NA, K, CL, CO2, BUN, CR, GLU, CA)
Anion Gap: 5 (ref 4–14)
Calcium, total, Serum / Plasma: 7.6 mg/dL — ABNORMAL LOW (ref 8.4–10.5)
Carbon Dioxide, Total: 24 mmol/L (ref 22–29)
Chloride, Serum / Plasma: 109 mmol/L (ref 101–110)
Creatinine: 0.37 mg/dL — ABNORMAL LOW (ref 0.55–1.02)
Glucose, non-fasting: 86 mg/dL (ref 70–199)
Potassium, Serum / Plasma: 3.5 mmol/L (ref 3.5–5.0)
Sodium, Serum / Plasma: 138 mmol/L (ref 135–145)
Urea Nitrogen, Serum / Plasma: 3 mg/dL — ABNORMAL LOW (ref 7–25)
eGFRcr: 115 mL/min/{1.73_m2} (ref >59)

## 2021-12-26 LAB — PROTHROMBIN TIME
INR: 1.3 — ABNORMAL HIGH (ref 0.9–1.2)
PT: 15.7 s — ABNORMAL HIGH (ref 11.6–15.0)

## 2021-12-26 LAB — POCT GLUCOSE: Glucose, Glucometer: 88 mg/dL (ref 70–199)

## 2021-12-26 LAB — CLOSTRIDIUM DIFFICILE
Clostridium difficile: NEGATIVE
Clostridium difficile: NOT DETECTED
Comments: NEGATIVE

## 2021-12-26 LAB — FIBRINOGEN, FUNCTIONAL: Fibrinogen, Functional: 157 mg/dL — ABNORMAL LOW (ref 202–430)

## 2021-12-26 LAB — NATURAL KILLER CELLS, FUNCTIONAL: Natural Killer Cells, Functional: 10 LU30

## 2021-12-26 MED FILL — GANCICLOVIR SODIUM 500 MG INTRAVENOUS SOLUTION: 500 mg | INTRAVENOUS | Qty: 8

## 2021-12-26 MED FILL — DICYCLOMINE 10 MG CAPSULE: 10 mg | ORAL | Qty: 2

## 2021-12-26 MED FILL — ERGOCALCIFEROL (VITAMIN D2) 1,250 MCG (50,000 UNIT) CAPSULE: 50000 unit | ORAL | Qty: 1

## 2021-12-26 MED FILL — URSODIOL 300 MG CAPSULE: 300 mg | ORAL | Qty: 2

## 2021-12-26 MED FILL — VITAMIN E (DL, ACETATE) 45 MG (100 UNIT) CAPSULE: 45 mg (100 unit) | ORAL | Qty: 8

## 2021-12-26 MED FILL — POTASSIUM CHLORIDE 40 MEQ/100ML IN STERILE WATER INTRAVENOUS PIGGYBACK: 40 mEq/100 mL | INTRAVENOUS | Qty: 100

## 2021-12-26 MED FILL — FLINTSTONES COMPLETE (IRON) CHEWABLE TABLET: ORAL | Qty: 1

## 2021-12-26 MED FILL — THIAMINE HCL (VITAMIN B1) 100 MG/ML INJECTION SOLUTION: 100 mg/mL | INTRAMUSCULAR | Qty: 2

## 2021-12-26 NOTE — Progress Notes (Signed)
[facility] PROGRESS NOTE  ATTENDING ONLY     My date of service is 12/26/21.    24 Hour Course  - Colonic biopsy: Prelim: "colonic mucosa with a rare small non-necrotizing mucosal granuloma and rare CMV immunolabeling"   -Hepatology follow up: CMV stain negative. Probable liver injury from hypotension and possible exposure to drugs that are prone to liver toxicity. Increase Ursodiol to 600 mg BID, check Hepatitis e and TTE. 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  -IVIG for CMV treatment.  -Diarrhea not likely from Ursodiol. C diff negative, Rpt GI viral PCR pending.    Subjective  Pt is feeling well. Mental status improved significantly. Otherwise no focal symptoms. Actively asking more questions today. Has diarrhea X 8 episodes.    Vitals  Temp:  [36.7 C (98.1 F)-37.1 C (98.8 F)] 36.7 C (98.1 F)  Heart Rate:  [82-96] 96  *Resp:  [16-18] 18  BP: (95-109)/(65-75) 107/72  SpO2:  [95 %-99 %] 95 %    MostRecent Weight: 86.3 kg (190 lb 4.1 oz)  Admission Weight: 79.6 kg (175 lb 7.8 oz)      Intake/Output Summary (Last 24 hours) at 12/26/2021 1719  Last data filed at 12/26/2021 1316  Gross per 24 hour   Intake 1468 ml   Output 2750 ml   Net -1282 ml       Pain Score: Eyes closed, patient calm    Physical Exam  Appearance: NAD.      Comments: Alert and oriented X 3 slow to respond. Lacks insight    HENT:      Head: Normocephalic and atraumatic.      Nose: Nose normal.   Neck:  R IJ CVC with dressing in place, c/d/i  Eyes:      General: Scleral icterus present.      Extraocular Movements: Extraocular movements intact.      Conjunctiva/sclera: Conjunctivae normal.      Pupils: Pupils are equal, round, and reactive to light.   Cardiovascular:      Rate and Rhythm: Normal rate and regular rhythm.      Pulses: Normal pulses.      Heart sounds: Normal heart sounds. No murmur heard.    No friction rub. No gallop.   Pulmonary:      Effort: Pulmonary effort is normal. No respiratory distress.      Breath sounds:  Normal breath sounds. No stridor. No wheezing, rhonchi or rales.   Abdominal:      General: Abdomen is flat. Bowel sounds are normal. There is no distension.      Palpations: Abdomen is soft. There is no mass.      Tenderness: There is no abdominal tenderness. There is no guarding or rebound.      Hernia: No hernia is present.      Comments: Foley catheter in place, urine appears dark  No suprapubic tenderness.   Musculoskeletal:         General: Normal range of motion.      Cervical back: Normal range of motion and neck supple.      Right lower leg: Edema present.      Left lower leg: Edema present.      Comments: Strength 1/5 in the bilateral lower extremities, unable to raise them to gravity.   Pitting edema 3-4+ in the bilateral lower extremities.   Anasarca in the abdomen and thighs bilaterally.    Skin:     General: Skin is warm.  Capillary Refill: Capillary refill takes less than 2 seconds.      Coloration: Skin is jaundiced.      Findings: Bruising present.      Comments: ecchymoses on the sternum   Neurological:      Mental Status: She is alert. She is disoriented.      Motor: Weakness present.       Scheduled Meds:   0.9% sodium chloride flush  10-20 mL Intravenous Q12H [place]    0.9% sodium chloride flush  3 mL Intravenous Q12H SCH    dicyclomine  20 mg Oral 4x Daily [place]    dry mouth oral rinse  5 mL Mucous Membrane 4x Daily [place]    ergocalciferol (vitamin D2)  50,000 Units Oral Q7 Days [place]    ganciclovir (CYTOVENE) IVPB  5 mg/kg (Dosing Weight) Intravenous Q12H [place]    multivitamin complete chewable  1 tablet Oral Daily [place]    thiamine  200 mg Intravenous Q24H    ursodioL  600 mg Oral BID [place]    vitamin E (dl, acetate)  800 Units Oral Daily [place]     Continuous Infusions:   dextrose 5 % and 0.9 % sodium chloride 75 mL/hr (12/26/21 1316)     PRN Meds:   0.9% sodium chloride flush  10-20 mL Intravenous PRN    0.9% sodium chloride flush  3 mL Intravenous PRN    albuterol  2.5 mg Nebulization Q4H  PRN    Or    albuterol  2.5 mg Nebulization Q4H PRN    dextrose 50%  12.5 g Intravenous Q15 Min PRN    Or    glucose  20 g Oral Q15 Min PRN    Or    juice (for hypoglycemia)  16 g Oral Q15 Min PRN    diphenhydrAMINE  50 mg Intravenous Once PRN    EPINEPHrine  0.3 mg Intramuscular Once PRN    hydrocortisone  100 mg Intravenous Once PRN    HYDROmorphone  0.3-0.8 mg Intravenous Q4H PRN    LORazepam  0.5 mg Oral Once PRN    magnesium sulfate in dextrose 5 %  2-4 g Intravenous Daily PRN    Or    magnesium sulfate in water  2-4 g Intravenous Daily PRN    metoclopramide  5-10 mg Intravenous Q8H PRN    ondansetron  8 mg Intravenous Q8H PRN    polyethylene glycol  17 g Oral Daily PRN    potassium chloride in sterile water  20-80 mEq Intravenous Daily PRN    Or    potassium chloride in sterile water  20-80 mEq Intravenous Daily PRN    Or    potassium chloride  20-80 mEq Oral Daily PRN       Data    CBC        12/26/21  0335   WBC 4.3   HGB 8.3*   HCT 25.1*   PLT 86*     Coags        12/26/21  0335   INR 1.3*     Chem7        12/26/21  0335 12/25/21  1659   NA 138 138   K 3.2* 3.5   CL 108 109   CO2 23 24   BUN 3* 3*   CREAT 0.35* 0.37*   GLU 89 86     Electrolytes        12/26/21  0335 12/25/21  1659   CA 7.4* 7.6*  Liver Panel        12/26/21  0335   AST 81*   ALT 57   ALKP 507*   TBILI 15.5*   TP 3.5*   ALB 1.2*     Lactate  No results found in last 36 hours    Microbiology Results (last 24 hours)     Procedure Component Value Units Date/Time    GI Viral Panel PCR [[id number]]     Order Status: Sent Specimen: Not Applicable from Stool     GI Viral Panel PCR [[id number]]     Order Status: Canceled Specimen: Not Applicable from Stool     GI Bacteria Panel PCR [[id number]]     Order Status: Canceled Specimen: Not Applicable from Stool     GI Parasite Panel PCR [[id number]]     Order Status: Canceled Specimen: Not Applicable from Stool     GI Viral Panel PCR [[id number]]     Order Status: Completed Specimen: Not  Applicable from Stool     GI Viral Panel PCR [[id number]]     Order Status: Canceled Specimen: Not Applicable from Stool     Clostridium Difficile [[id number]] Collected: 12/25/21 1533    Order Status: Completed Specimen: Not Applicable from Stool, Loose Updated: 12/25/21 2326     Comments Reference range: Negative test     Clostridium difficile Toxin gene: Not detected      Comment: Negative test          Radiology Results  No results found.     I discussed with Dr. Bonnita Nasuti from [facility] regarding A&P.    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 The patient has received VRd X 4 (achieving VGPR) then Mel 200 ASCT 05/12/11. 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 She met the criteria for PD on 02/19/2020 and was started on third line DARA PD in June 2021, which was recently held on 10/20/21 due to chronic diarrhea. Patient was transferred OSH after recent hypovolemic/distributive shock. Here for acute encephalopathy workup and acute liver failure workup.      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   June 2020 due to progression of M spike Revlimid was increased to 15 mg PO D1-21 every 28 days, plus dexamethasone  July 2021: started daratumumab, Pomylast 2 mg D 1-21, Q28 days, dexamethasone 12 mg PO Q weekly.   Primary oncologist: Dr. Iona Coach,  Dr. Kennon Rounds [facility]  Receiving dexamethasone 12 mg weekly for diarrhea  Receiving MT with daratumumab with pomalyst and dex, most recent dose 10/16/21, has since been on hold due to worsening diarrhea.     DATA:   09/07/2010: IgG Kappa MM  12/02/2021: Ig M <5, IgG 447, IgA 20, Ig E <2, SFLC Kappa 16.6, Lambda 1.6, Kappa/Lamda 10.38  12/14/21: IgG K on IFE, IgG 430, KLC  16.6, LLC 2.7, KLR 6.15    Chemo:   Holding Daratumumab and Pomalyst, last dose 10/20/21, given recent infection and acute liver failure.    Supportive Care:   Transfusion: Keep Hg >7, and Platelets >10.   Per daughter, has autoantibodies in blood was a difficult match. Would like to be contacted prior to transfusion.  Line: PICC placed 1/26  Outpatient Oncologist: Dr. Kennon Rounds    Dispo: TBD, after resolution of sepsis and liver failure    # Immunocompromised  #CMV colitis  # At risk for infections due to malignancy  # Shock, likely hypovolemic vs distributive in the setting of chronic diarrhea, + norovirus infection   # Acute on chronic diarrhea, norovirus positive., diff and OP negative.   -No prior evidence of adrenal insufficiency, AM cortisol levels normal, previously on dexamethasone 8 mg PO Qweekly, but has been held since early December. CMV PCR on 1/    Data:  - Ucx: pending  - Bcx: pending  - CT brain: No acute intracranial hemorrhage, herniation, or hydrocephalus.  - CT chest/A/P pending: Medium-sized bilateral pleural effusions with bilateral peribronchovascular groundglass opacities, suggestive of pulmonary edema. Clustered nodules in right middle lobe along the right minor fissure, which could represent mild aspiration or infection. Numerous sclerotic and mixed lytic/sclerotic lesions throughout the thoracic spine and bilateral ribs, increased in size and number compared to prior. Appearance is atypical for multiple myeloma but may represent a combination of treated, partially treated, and active disease. No acute pathologic fracture. Findings compatible with enterocolitis of infectious or inflammatory etiology. Involvement of the colon is greater than of the small bowel. Marked hepatic steatosis. Increased sclerosis of bone lesions throughout the thoracolumbar spine, bony pelvis, and proximal left femur. Appearance is atypical for multiple myeloma but may represent a combination of treated,  partially treated, and active disease. No acute pathologic fracture.  - 1/29:CMV PCR [id number]  - 1/31: GI PCR panel positive only for Norovirus.   2/1: LP results: Neutrophils 6% in tube 1, Lymphocytes 88% (predominant), protein 42 (normal), glucose 36 (low), rapid HSV in CSF negative. CMV CSF PCR negative    - 2/2 CMV PCR 179,555High   - 2/2 Flex Sig Biopsies prelim: "colonic mucosa with a rare small non-necrotizing mucosal granuloma and rare CMV immunolabeling"   - 2/5 [place] work up: sIL2 (1556.6 high), ferritin (948 high), TG(258 high), NKA pending [ ]    -2/8: CMV PCR: 3107 (sig improvement)  2/10: C diff: negative.  -2/11: Repeat GI viral PCR: Pending.    Plan:  - appreciate ID reccs   - Ganciclovir 5 mg/kg (1/31-). S/p one dose of IVIG on 2/7. Can switch to oral meds once the diarrhea improves.   - GI consult: appreciate reccs: flex sig 2/2    - stop zosyn: s/p IV Zosyn 4.5 g Q8hrs (1/26-1/28, restarted 1/29 given recurrent fever-12/16/21)  - checking weekly CMV, next 2/15 [ ]    - OI ppx: Acyclovir IV    # Grade III neutropenia, at baseline  # Leukopenia  #Thrombocytopenia (possibly 2/2 liver disease)  Data: WBC of 2.2 on 12/05/21, on Zarxio at home  Plan: Continue to monitor    # Acute liver injury of unclear etiology  # Acute on chronic encephalopathy likely 2/2 infectious vs hepatic vs metabolic  # Hyperbilirubinemia  # Anasarca  # Hypoalbuminemia    Data:   -Steady rise in LFTs in 05/2021,  -12/02/21: AST 152--> 121, ALT 130--> 110, TB 5.4, Albumin 1.1  -Hepatitis panel negative.   -1/26:  Tbili 14.6, GGT 1160, ammonia 25  -CTAP 12/02/21: Scattered sclerotic lesions in the lumbar spine, hepatic steatosis.   -RUQ ultrasound: liver enlarged with diffuse increased echogenicity, consistent with fatty infiltration. 2cm stone in the gallbladder.   -HIDA scan: Normal radiotracer filling of the gallbladder is identified, therefore the exam is not  consistent with cystic duct obstruction or acute cholecystitis.  -  [place] abd 1/27: Hepatomegaly with diffusely increased echogenicity which can be seen with hepatic amyloidosis or steatosis. Please refer to the recently performed biopsy for further details. Mild perihepatic ascites. Patent hepatic vasculature with normal waveforms. Cholelithiasis without acute cholecystitis. Right pleural effusion.  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 CMV stains: Negative.  - 1/30: Serum studies: Ferritin 2380, iron serum 60, transferrin 46 (low), 93 % sat (high), HEV IgM negative, PCR forVZV negative, HSV negative,CMV+ 443,961High,EBV negative, AMA negative, Hepatitis B core Ab, surface Ab negative, Hep A IgG +/IgM negative   IgG4 pending [ ]      Plan:   - Hepatology recs: 2/7: Path looks unlikely for vanishing bile duct syndrome. Could be drug related. Increase Ursodiol to 600 mg BID, Check Hepatitis E, TTE. 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 Unlikely diarrhea from Ursodiol.                         - Consider avoiding steroid    - Daily LFT/INR  - RD consult placed. Protein shakes to use. Family requesting appetite stimulant. Potential harm than benefit.    #AMS  In the setting of liver failure, ammonia only 25. LP performed 2/1 to r/o encephalitis.     Data:   1/31: HIV, TSH, Folate, B12 WNL  1/30: CT head WNL other than sinus disease  2/1: LP results: Neutrophils 6% in tube 1, Lymphocytes 88% (predominant), protein 42 (normal), glucose 36 (low), rapid HSV in CSF negative. CMV CSF PCR negative  2/5: Brain MRI: Smooth diffuse dural thickening and enhancement as well as bilateral holohemispheric subdural effusions, which may be on the  basis of diminished intracranial pressure following recent lumbar puncture.  -2/8: EEG: non-specific mild cerebral dysfunction.  -2/9: CT Brain: pending.     Plan:  - Neurology consult: 2/7: Encephalopathy likely from underlying medical disease. Delirium prevention. Avoid Ativan and try Seroquel for delirium. MRI findings is from LP. Okay to try caffiene tablets in the daytime to help with postural headache but patient is allergic to it.  -2/8: Repeat CT head to monitor the status of subdural effusion: stable, EEG: diffuse slowing, Thiamine iv prophylaxis.  -Speech path: Regular and thin liquid. Slightly higher risk for aspiration given encephalopathy.    #Hypokalemia:  #Hypomagnesemia:  Replete as needed.    #Vitamin D deficiency:  -Supplements ordered.    #Moderate malnutrition:  -Seen by RD and given recs for supplements.    # History of PE, diagnosed in April 2019  - hold home [place] for now given recent anemia and concerns for bleeding.   - monitor for signs and symptoms of worsening bleeding.     # Elevated TSH at OSH, likely 2/2 subclinical hypothyroidism  -TSH WNL on 12/15/20.      # Anxiety, emotional distress:   -Patient has stressful social situation, intellectually disabled son, and regarding underlying disease process.     #Chronic buttocks pressure wound  -Wound care consult, appreciate recs  -Position changes as tolerated, waffle cushion, etc    #VTE PPx:  Contraindicated: Anticipated procedure    Severity  of Illness     During this hospitalization the patient is also being treated for:  - Sepsis (present on admission)    - Malignancy associated fatigue on admission  - Neoplasm/malignancy associated pain  - Hypoalbuminemia (present on admission)  - Fluid status: Hypovolemia on admission     - Cachexia  - Encephalopathy: Metabolic encephalopathy   --Pressure wound ruled out, treating bilateral buttocks moisture and friction damage     Code Status: FULL    James Ivanoff, MD  12/26/21

## 2021-12-26 NOTE — Plan of Care (Signed)
Problem: Pain,  Acute / Chronic- Adult  Goal: Control of chronic pain  Outcome: Progress within 12 hours

## 2021-12-26 NOTE — Plan of Care (Signed)
Problem: Discharge Planning - Adult  Goal: Knowledge of and participation in plan of care  Outcome: Progress within 12 hours     Problem: Fall, at Risk or Actual - Adult  Goal: Absence of falls and fall related injury  Outcome: Progress within 12 hours     Problem: Delirium - Adult / Pediatric  Goal: Absence or resolution of delirium  Outcome: Progress within 12 hours     Problem: Pain,  Acute / Chronic- Adult  Goal: Control of chronic pain  Outcome: Progress within 12 hours   Afebrile, turn for comfort, will continue to monitor.

## 2021-12-26 NOTE — Consults (Signed)
INFECTIOUS DISEASES FOLLOW UP CONSULT NOTE  ATTENDING ONLY     My date of service is 12/26/2021.    Interim History since previous Infectious Disease note  - BM x8 between 7AM yest and 7AM today  - C diff neg    Subjective  - No fevers, abdominal pain, SOB    Medications  Scheduled Meds:   0.9% sodium chloride flush  10-20 mL Intravenous Q12H [place]    0.9% sodium chloride flush  3 mL Intravenous Q12H [place]    dicyclomine  20 mg Oral 4x Daily [place]    dry mouth oral rinse  5 mL Mucous Membrane 4x Daily [place]    ergocalciferol (vitamin D2)  50,000 Units Oral Q7 Days [place]    ganciclovir (CYTOVENE) IVPB  5 mg/kg (Dosing Weight) Intravenous Q12H [place]    multivitamin complete chewable  1 tablet Oral Daily [place]    thiamine  200 mg Intravenous Q24H    ursodioL  600 mg Oral BID [place]    vitamin E (dl, acetate)  800 Units Oral Daily [place]     Continuous Infusions:   dextrose 5 % and 0.9 % sodium chloride 75 mL/hr (12/25/21 2312)       Antibiotic History  Current  Ganciclovir 1/31-    Prior  Pip-tazo 1/18-1/22, 1/24-1/27, 1/29-1/31  Micafungin 1/31-2/2  CTX 1/19  Vanc 1/18-1/19    Ppx:  Levofloxacin    Vitals  Temp:  [36.5 C (97.7 F)-37.3 C (99.1 F)] 36.9 C (98.4 F)  Heart Rate:  [80-96] 96  *Resp:  [16-20] 17  BP: (83-109)/(56-75) 109/75  SpO2:  [95 %-99 %] 95 %      Intake/Output Summary (Last 24 hours) at 12/26/2021 1003  Last data filed at 12/26/2021 0404  Gross per 24 hour   Intake 3228 ml   Output 2950 ml   Net 278 ml       Physical Examination  Constitutional: In bed, NAD  HENT: NCAT.  MMM  Eyes: Scleral icterus, EOM normal  Neck: Neck supple.  Cardiovascular: Normal rate, regular rhythm, S1/S2 normal.  No murmurs or friction rub.  Pulmonary/Chest: No respiratory distress. CTAB.  No wheezes or rhonchi.   GI: Soft. +BS.  No distension, tenderness, or rebound. No organomegaly.  Extremities: No clubbing, cyanosis, or edema.  Neurological: Awake, conversant  Skin: Jaundiced No rashes, lesions, or peripheral stigmata of  endocarditis.   Psychiatric: Normal mood and affect.     Lines/tubes: LUE PICC    Data    CBC        12/26/21  0335 12/25/21  0438 12/24/21  0347   WBC 4.3 3.3* 3.8   HGB 8.3* 8.0* 8.4*   HCT 25.1* 23.4* 24.7*   PLT 86* 80* 88*   NEUTA 3.88 2.92 3.37   LYMA 0.21* 0.18* 0.26*   MOA 0.12* 0.11* 0.10*   EOA 0.00 0.01 0.01   BASOA 0.01 0.00 0.00       Chem7        12/26/21  0335 12/25/21  1659 12/25/21  0438   NA 138 138 139   K 3.2* 3.5 2.5*   CL 108 109 106   CO2 23 24 29    BUN 3* 3* 3*   CREAT 0.35* 0.37* 0.31*   GLU 89 86 86       Liver Panel        12/26/21  0335 12/25/21  0511 12/24/21  0347 12/11/21  0328 12/10/21  0211  AST 81* 67* 72*   < > 101*   ALT 57 55 56   < > 116*   ALKP 507* 485* 496*   < > 745*   TBILI 15.5* 15.0* 15.5*   < > 14.6*   TP 3.5* 3.4* 3.6*   < > 3.2*   ALB 1.2* 1.2* 1.2*   < > 2.0*   GGT  --   --   --   --  1,160*    < > = values in this interval not displayed.       CRP        12/13/21  0302   CRP 11.3*       Microbiology  [place]  2/10 C diff neg  2/8 CMV PCR 3,107  2/1 CSF   0 WBCs, normal protein, glucose low at 36.  HSV PCR neg  CMV PCR neg  2/1 C diff neg  2/1 GI PCR + norovirus  2/1 CMV PCR 179,555   1/30 UCx >100K C albicans  1/29 UCx >100K C albicans  1/29 cBCx x2 NG  1/29 CMV PCR 062,376  1/29 Hep E IgM neg  1/29 HBeAb neg  1/28 VZV PCR neg  1/28 HSV PCR neg  1/27 HAV IgG positive  1/27 EBV PCR neg  1/27 HBsAg neg, sAb 12  1/26 pBCx x2 NG  1/26 MRSA swab neg  1/26 C19 PCR neg  1/26 UCx >100K C [place]    OSH  1/18 GI PCR: +norovirus  1/18: flu, RSV, covid neg  1/22: CMV PCR positive  1/22: EBV PCR neg  1/24: blood cx x2: neg  1/25: VZV PCR: neg    Radiology Results  No results found.     Pathology  12/17/21 Flex sig path:   FINAL PATHOLOGIC DIAGNOSIS  Colon, biopsy:  Colonic mucosa with a rare small non-necrotizing mucosal  granuloma and rare CMV immunolabeling; see comment.   COMMENT:   Sections show colonic mucosa with a rare small non-necrotizing  granuloma, but no active colitis.   There is relatively depleted lamina  propria inflammatory cells and no overtly atypical plasma cells,  organisms, or viropathic change.  An initial CMV immunostain shows rare  luminal staining, possibly artifactual, but on repeat CMV stain there is  more convincing labeling of rare epithelial cells, and possibly  endothelial cells and/or lymphocytes, to raise consideration for a CMV  contribution to diarrhea (though a norovirus infection cannot be  excluded).  [place], [name], and GMS stains are negative for organisms.   Adenovirus immunostain is negative.  Kappa/lambda immunostains show  ~2-3:1 ratio (K:L) supporting a polytypic plasma cell     12/11/21 Liver biopsy:  FINAL PATHOLOGIC DIAGNOSIS  Liver, transjugular biopsy:  1.  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  2.  Ductular reaction with focal pericholangitis; see comment.  COMMENT:  The biopsy shows severe steatosis with pericentral/sinusoidal and  periportal fibrosis, histiocytic and neutrophilic lobular inflammation,  Mallory hyaline as well as rare ballooned hepatocytes. 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  Clinical correlation with steatohepatitis risk factors is suggested,  including for any history of massive weight loss which may lead to an  aggressive onset of steatohepatitis [1].  The history of dexamethasone  use is noted; corticosteroid use has been associated with exacerbation  of pre-existing fatty liver disease [2].  Additionally, there is ductular reaction with focal pericholangitis,  raising consideration for a duct obstructive process. Clinical  correlation is advised. The concern for other drug-induced liver injury  is noted. In particular, thalidomide derivatives (such as pomalidomide)  have been reported in association with vanishing bile duct syndrome  (VBDS) [3]; however, there is no convincing duct loss to suggest VBDS.  There are no clusters  of plasma cells seen to suggest involvement by  myeloma.  Addendum Comment  In order to further evaluate this case, immunohistochemistry for CMV was  performed and evaluated on block A1 as follows:  - CMV:          Negative.      Assessment and Recommendations  60W with MM s/p mel-auto 2012 on dara / pom / dex with subacute weakness and GI illness (abdominal pain, diarrhea) and encephalopathy since 07/2021 admitted 12/02/21 to [facility] for norovirus c/b hypovolemic shock transferred to [facility] 12/09/21. 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 Other work-up notable for liver bx CMV IHC neg, CSF PCR neg. On ganciclovir since 1/31 with improvement of diarrhea, last VL downtrending to 3K 2/8.    # Diarrhea with CT + enterocolitis: Suspect multifactorial with norovirus and CMV involvement. Initially improved but now recurred for which ddx includes infection vs non-infectious. Query whether new medications (e.g. ursodiol) could be contributing.  As C diff neg, consider repeat GI PCR panel to check for norovirus.     # Encephalopathy: Low suspicion for infection as LP without pleocytosis and negative CMV PCR; MRI Brain without acute findings. Per Neuro, most c/w toxic metabolic.     # Elevated LFTs: 1/27 liver biopsy + steatosis, CMV IHC neg. Per Hepatology, NASH + hypoperfusion + cholestasis.    Dx  - Weekly CMV PCR (next due 2/15) until low level <300 x1  - Review with pharmacy potential new meds contributing to diarrhea (e.g. ursodiol)  - Consider repeat stool GI PCR if no obvious culprit for diarrhea  Rx  - ContinueIV ganciclovir 5 mg/kg q12  Plan to transition to PO valganciclovir once diarrhea stably resolved    We will continue to follow with you.  Please contact Consult ID Transplant [facility] 1st Call via Voalte with questions.    Murlean Hark, MD  12/26/2021    I spent a total time of 35  minutes in this episode of care preparing to see the patient, obtaining and/or reviewing separately obtained history, performing a medically appropriate examination and/or evaluation, counseling and educating patient/family/caregiver, ordering and reviewing medications, tests, or procedures, referring and communicating with other health care professionals, documenting clinical information in the electronic or other health record, and communicating results to the patient/family/caregiver, and/or other care coordination.

## 2021-12-27 LAB — COMPLETE BLOOD COUNT WITH DIFFERENTIAL
Abs Basophils: 0 10*9/L (ref 0.00–0.10)
Abs Eosinophils: 0.01 10*9/L (ref 0.00–0.40)
Abs Imm Granulocytes: 0.1 10*9/L — ABNORMAL HIGH (ref <0.10)
Abs Lymphocytes: 0.2 10*9/L — ABNORMAL LOW (ref 1.00–3.40)
Abs Monocytes: 0.13 10*9/L — ABNORMAL LOW (ref 0.20–0.80)
Abs Neutrophils: 5.18 10*9/L (ref 1.80–6.80)
Hematocrit: 24.4 % — ABNORMAL LOW (ref 36.0–46.0)
Hemoglobin: 8.2 g/dL — ABNORMAL LOW (ref 12.0–15.5)
MCH: 34.9 pg — ABNORMAL HIGH (ref 26.0–34.0)
MCHC: 33.6 g/dL (ref 31.0–36.0)
MCV: 104 fL — ABNORMAL HIGH (ref 80–100)
MPV: 12.7 fL — ABNORMAL HIGH (ref 9.1–12.6)
Platelet Count: 86 10*9/L — ABNORMAL LOW (ref 140–450)
RBC Count: 2.35 10*12/L — ABNORMAL LOW (ref 4.00–5.20)
RDW-CV: 24.8 % — ABNORMAL HIGH (ref 11.7–14.4)
WBC Count: 5.6 10*9/L (ref 3.4–10.0)

## 2021-12-27 LAB — BASIC METABOLIC PANEL (NA, K, CL, CO2, BUN, CR, GLU, CA)
Anion Gap: 7 (ref 4–14)
Calcium, total, Serum / Plasma: 7.5 mg/dL — ABNORMAL LOW (ref 8.4–10.5)
Carbon Dioxide, Total: 22 mmol/L (ref 22–29)
Chloride, Serum / Plasma: 111 mmol/L — ABNORMAL HIGH (ref 101–110)
Creatinine: 0.36 mg/dL — ABNORMAL LOW (ref 0.55–1.02)
Glucose, non-fasting: 96 mg/dL (ref 70–199)
Potassium, Serum / Plasma: 4.3 mmol/L (ref 3.5–5.0)
Sodium, Serum / Plasma: 140 mmol/L (ref 135–145)
Urea Nitrogen, Serum / Plasma: 3 mg/dL — ABNORMAL LOW (ref 7–25)
eGFRcr: 115 mL/min/{1.73_m2} (ref >59)

## 2021-12-27 LAB — COMPREHENSIVE METABOLIC PANEL (BMP, AST, ALT, T.BILI, ALKP, TP ALB)
AST: 66 U/L — ABNORMAL HIGH (ref 5–44)
Alanine transaminase: 59 U/L (ref 10–61)
Albumin, Serum / Plasma: 1.3 g/dL — ABNORMAL LOW (ref 3.4–4.8)
Alkaline Phosphatase: 506 U/L — ABNORMAL HIGH (ref 38–108)
Anion Gap: 4 (ref 4–14)
Bilirubin, Total: 15.7 mg/dL — ABNORMAL HIGH (ref 0.2–1.2)
Calcium, total, Serum / Plasma: 7.3 mg/dL — ABNORMAL LOW (ref 8.4–10.5)
Carbon Dioxide, Total: 26 mmol/L (ref 22–29)
Chloride, Serum / Plasma: 110 mmol/L (ref 101–110)
Creatinine: 0.36 mg/dL — ABNORMAL LOW (ref 0.55–1.02)
Glucose, non-fasting: 96 mg/dL (ref 70–199)
Potassium, Serum / Plasma: 2.7 mmol/L — CL (ref 3.5–5.0)
Protein, Total, Serum / Plasma: 3.5 g/dL — ABNORMAL LOW (ref 6.3–8.6)
Sodium, Serum / Plasma: 140 mmol/L (ref 135–145)
Urea Nitrogen, Serum / Plasma: 3 mg/dL — ABNORMAL LOW (ref 7–25)
eGFRcr: 115 mL/min/{1.73_m2} (ref >59)

## 2021-12-27 LAB — FIBRINOGEN, FUNCTIONAL: Fibrinogen, Functional: 148 mg/dL — ABNORMAL LOW (ref 202–430)

## 2021-12-27 LAB — POCT GLUCOSE
Glucose, Glucometer: 86 mg/dL (ref 70–199)
Glucose, Glucometer: 102 mg/dL (ref 70–199)
Glucose, Glucometer: 81 mg/dL (ref 70–199)

## 2021-12-27 LAB — MAGNESIUM, SERUM / PLASMA
Magnesium, Serum / Plasma: 2.1 mg/dL (ref 1.6–2.6)
Magnesium, Serum / Plasma: 1.9 mg/dL (ref 1.6–2.6)

## 2021-12-27 LAB — PROTHROMBIN TIME
INR: 1.3 — ABNORMAL HIGH (ref 0.9–1.2)
PT: 15.8 s — ABNORMAL HIGH (ref 11.6–15.0)

## 2021-12-27 LAB — BILIRUBIN, DIRECT: Bilirubin, Direct: 10.7 mg/dL — ABNORMAL HIGH (ref <0.6)

## 2021-12-27 MED ORDER — POTASSIUM CHLORIDE 40 MEQ/100ML IN STERILE WATER INTRAVENOUS PIGGYBACK
40 | Freq: Once | INTRAVENOUS | Status: AC
Start: 2021-12-27 — End: 2021-12-27
  Administered 2021-12-28: 05:00:00 via INTRAVENOUS

## 2021-12-27 MED ORDER — NYSTATIN 100,000 UNIT/GRAM TOPICAL POWDER
100000 | Freq: Two times a day (BID) | TOPICAL | Status: DC
Start: 2021-12-27 — End: 2021-12-30
  Administered 2021-12-27 – 2021-12-30 (×6): 1 via TOPICAL
  Administered 2021-12-30: 18:00:00 via TOPICAL

## 2021-12-27 MED ORDER — POTASSIUM CHLORIDE 40 MEQ/100ML IN STERILE WATER INTRAVENOUS PIGGYBACK
40 | Freq: Once | INTRAVENOUS | Status: AC
Start: 2021-12-27 — End: 2021-12-27
  Administered 2021-12-27: 19:00:00 40 meq via INTRAVENOUS

## 2021-12-27 MED ORDER — MAGNESIUM SULFATE 2 GRAM/50 ML (4 %) IN WATER INTRAVENOUS PIGGYBACK
2 | Freq: Once | INTRAVENOUS | Status: AC
Start: 2021-12-27 — End: 2021-12-27
  Administered 2021-12-28: 04:00:00 via INTRAVENOUS

## 2021-12-27 MED ORDER — POTASSIUM CHLORIDE 40 MEQ/100ML IN STERILE WATER INTRAVENOUS PIGGYBACK
40 | Freq: Once | INTRAVENOUS | Status: AC
Start: 2021-12-27 — End: 2021-12-27
  Administered 2021-12-27: 14:00:00 40 meq via INTRAVENOUS

## 2021-12-27 MED ORDER — POTASSIUM CHLORIDE 20 MEQ/50 ML IN STERILE WATER INTRAVENOUS PIGGYBACK
20 | Freq: Once | INTRAVENOUS | Status: AC
Start: 2021-12-27 — End: 2021-12-27
  Administered 2021-12-27: 21:00:00 20 meq via INTRAVENOUS

## 2021-12-27 MED FILL — DILAUDID (PF) 0.5 MG/0.5 ML INJECTION SYRINGE: 0.5 mg/ mL | INTRAMUSCULAR | Qty: 0.5

## 2021-12-27 MED FILL — URSODIOL 300 MG CAPSULE: 300 mg | ORAL | Qty: 2

## 2021-12-27 MED FILL — NYSTOP 100,000 UNIT/GRAM TOPICAL POWDER: 100000 unit/gram | TOPICAL | Qty: 15

## 2021-12-27 MED FILL — DICYCLOMINE 10 MG CAPSULE: 10 mg | ORAL | Qty: 2

## 2021-12-27 MED FILL — POTASSIUM CHLORIDE 20 MEQ/50 ML IN STERILE WATER INTRAVENOUS PIGGYBACK: 20 mEq/50 mL | INTRAVENOUS | Qty: 50

## 2021-12-27 MED FILL — POTASSIUM CHLORIDE 40 MEQ/100ML IN STERILE WATER INTRAVENOUS PIGGYBACK: 40 mEq/100 mL | INTRAVENOUS | Qty: 100

## 2021-12-27 MED FILL — FLINTSTONES COMPLETE (IRON) CHEWABLE TABLET: ORAL | Qty: 1

## 2021-12-27 MED FILL — GANCICLOVIR SODIUM 500 MG INTRAVENOUS SOLUTION: 500 mg | INTRAVENOUS | Qty: 8

## 2021-12-27 MED FILL — THIAMINE HCL (VITAMIN B1) 100 MG/ML INJECTION SOLUTION: 100 mg/mL | INTRAMUSCULAR | Qty: 2

## 2021-12-27 NOTE — Progress Notes (Signed)
[facility] PROGRESS NOTE  ATTENDING ONLY     My date of service is 12/27/21.    24 Hour Course  - Colonic biopsy: Prelim: "colonic mucosa with a rare small non-necrotizing mucosal granuloma and rare CMV immunolabeling"   -Hepatology follow up: CMV stain negative. Probable liver injury from hypotension and possible exposure to drugs that are prone to liver toxicity. Increase Ursodiol to 600 mg BID, check Hepatitis e and TTE. 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  -IVIG for CMV treatment.  -Diarrhea not likely from Ursodiol. C diff negative, Rpt GI viral PCR pending.    Subjective  Pt is feeling well. Mental status improved significantly. Otherwise no focal symptoms. Actively asking more questions today and is remembering older facts. Has diarrhea X 6 episodes. Watery and pt is incontinent causing difficulty with collecting specimen.    Vitals  Temp:  [36.1 C (97 F)-37.6 C (99.7 F)] 36.1 C (97 F)  Heart Rate:  [68-98] 79  *Resp:  [16-18] 16  BP: (92-106)/(62-75) 92/62  SpO2:  [96 %-98 %] 97 %    MostRecent Weight: 85 kg (187 lb 6.3 oz)  Admission Weight: 79.6 kg (175 lb 7.8 oz)      Intake/Output Summary (Last 24 hours) at 12/27/2021 1554  Last data filed at 12/27/2021 1323  Gross per 24 hour   Intake 2515.44 ml   Output 2350 ml   Net 165.44 ml       Pain Score: 7    Physical Exam  Appearance: NAD.      Comments: Alert and oriented X 3 slow to respond. Lacks insight    HENT:      Head: Normocephalic and atraumatic.      Nose: Nose normal.   Neck:  R IJ CVC with dressing in place, c/d/i  Eyes:      General: Scleral icterus present.      Extraocular Movements: Extraocular movements intact.      Conjunctiva/sclera: Conjunctivae normal.      Pupils: Pupils are equal, round, and reactive to light.   Cardiovascular:      Rate and Rhythm: Normal rate and regular rhythm.      Pulses: Normal pulses.      Heart sounds: Normal heart sounds. No murmur heard.    No friction rub. No gallop.   Pulmonary:       Effort: Pulmonary effort is normal. No respiratory distress.      Breath sounds: Normal breath sounds. No stridor. No wheezing, rhonchi or rales.   Abdominal:      General: Abdomen is flat. Bowel sounds are normal. There is no distension.      Palpations: Abdomen is soft. There is no mass.      Tenderness: There is no abdominal tenderness. There is no guarding or rebound.      Hernia: No hernia is present.      Comments: Foley catheter in place, urine appears dark  No suprapubic tenderness.   Musculoskeletal:         General: Normal range of motion.      Cervical back: Normal range of motion and neck supple.      Right lower leg: Edema present.      Left lower leg: Edema present.      Comments: Strength 1/5 in the bilateral lower extremities, unable to raise them to gravity.   Pitting edema 3-4+ in the bilateral lower extremities.   Anasarca in the abdomen and thighs bilaterally.  Skin:     General: Skin is warm.      Capillary Refill: Capillary refill takes less than 2 seconds.      Coloration: Skin is jaundiced.      Findings: Bruising present.      Comments: ecchymoses on the sternum   Neurological:      Mental Status: She is alert. She is disoriented.      Motor: Weakness present.       Scheduled Meds:   0.9% sodium chloride flush  10-20 mL Intravenous Q12H [place]    0.9% sodium chloride flush  3 mL Intravenous Q12H [place]    dicyclomine  20 mg Oral 4x Daily [place]    dry mouth oral rinse  5 mL Mucous Membrane 4x Daily [place]    ergocalciferol (vitamin D2)  50,000 Units Oral Q7 Days [place]    ganciclovir (CYTOVENE) IVPB  5 mg/kg (Dosing Weight) Intravenous Q12H [place]    multivitamin complete chewable  1 tablet Oral Daily [place] [name]    nystatin  1 Application Topical BID [place]    thiamine  200 mg Intravenous Q24H    ursodioL  600 mg Oral BID [place]    vitamin E (dl, acetate)  800 Units Oral Daily [place]     Continuous Infusions:   dextrose 5 % and 0.9 % sodium chloride 75 mL/hr (12/27/21 1323)     PRN Meds:   0.9% sodium chloride  flush  10-20 mL Intravenous PRN    0.9% sodium chloride flush  3 mL Intravenous PRN    albuterol  2.5 mg Nebulization Q4H PRN    Or    albuterol  2.5 mg Nebulization Q4H PRN    dextrose 50%  12.5 g Intravenous Q15 Min PRN    Or    glucose  20 g Oral Q15 Min PRN    Or    juice (for hypoglycemia)  16 g Oral Q15 Min PRN    diphenhydrAMINE  50 mg Intravenous Once PRN    EPINEPHrine  0.3 mg Intramuscular Once PRN    hydrocortisone  100 mg Intravenous Once PRN    HYDROmorphone  0.3-0.8 mg Intravenous Q4H PRN    LORazepam  0.5 mg Oral Once PRN    magnesium sulfate in dextrose 5 %  2-4 g Intravenous Daily PRN    Or    magnesium sulfate in water  2-4 g Intravenous Daily PRN    metoclopramide  5-10 mg Intravenous Q8H PRN    ondansetron  8 mg Intravenous Q8H PRN    polyethylene glycol  17 g Oral Daily PRN    potassium chloride in sterile water  20-80 mEq Intravenous Daily PRN    Or    potassium chloride in sterile water  20-80 mEq Intravenous Daily PRN    Or    potassium chloride  20-80 mEq Oral Daily PRN       Data    CBC        12/27/21  0440   WBC 5.6   HGB 8.2*   HCT 24.4*   PLT 86*     Coags        12/27/21  0440   INR 1.3*     Chem7        12/27/21  0440 12/26/21  1743   NA 140 140   K 2.7* 4.3   CL 110 111*   CO2 26 22   BUN 3* 3*   CREAT 0.36* 0.36*   GLU 96  96     Electrolytes        12/27/21  0440 12/26/21  1743   CA 7.3* 7.5*   MG 1.9  --      Liver Panel        12/27/21  0440   AST 66*   ALT 59   ALKP 506*   TBILI 15.7*   TP 3.5*   ALB 1.3*     Lactate  No results found in last 36 hours    Microbiology Results (last 24 hours)     ** No results found for the last 24 hours. **          Radiology Results  No results found.     I discussed with Dr. Orland Dec from [facility] regarding A&P.    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 The patient has received VRd X 4 (achieving VGPR) then Mel 200 ASCT 05/12/11. The patient had PD 03/2014 and was started on  single-agent revelmid as a second line therapy. She met the criteria for PD on 02/19/2020 and was started on third line DARA PD in June 2021, which was recently held on 10/20/21 due to chronic diarrhea. Patient was transferred OSH after recent hypovolemic/distributive shock. Here for acute encephalopathy workup and acute liver failure workup.      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   June 2020 due to progression of M spike Revlimid was increased to 15 mg PO D1-21 every 28 days, plus dexamethasone  July 2021: started daratumumab, Pomylast 2 mg D 1-21, Q28 days, dexamethasone 12 mg PO Q weekly.   Primary oncologist: Dr. Iona Coach,  Dr. Kennon Rounds  hematology  Receiving dexamethasone 12 mg weekly for diarrhea  Receiving MT with daratumumab with pomalyst and dex, most recent dose 10/16/21, has since been on hold due to worsening diarrhea.     DATA:   09/07/2010: IgG Kappa MM  12/02/2021: Ig M <5, IgG 447, IgA 20, Ig E <2, SFLC Kappa 16.6, Lambda 1.6, Kappa/Lamda 10.38  12/14/21: IgG K on IFE, IgG 430, KLC 16.6, LLC 2.7, KLR 6.15    Chemo:   Holding Daratumumab and Pomalyst, last dose 10/20/21, given recent infection and acute liver failure.    Supportive Care:   Transfusion: Keep Hg >7, and Platelets >10.   Per daughter, has autoantibodies in blood was a difficult match. Would like to be contacted prior to transfusion.   Line: PICC placed 1/26  Outpatient Oncologist: Dr. Kennon Rounds    Dispo: TBD, after resolution of sepsis and liver failure    # Immunocompromised  #CMV colitis  # At risk for infections due to malignancy  # Shock, likely hypovolemic vs distributive in the setting of chronic diarrhea, + norovirus infection   # Acute on chronic diarrhea, norovirus positive., diff and OP negative.   -No prior  evidence of adrenal insufficiency, AM cortisol levels normal, previously on dexamethasone 8 mg PO Qweekly, but has been held since early December. CMV PCR on 1/    Data:  - Ucx: pending  - Bcx: pending  - CT brain: No acute intracranial hemorrhage, herniation, or hydrocephalus.  - CT chest/A/P pending: Medium-sized bilateral pleural effusions with bilateral peribronchovascular groundglass opacities,  suggestive of pulmonary edema. Clustered nodules in right middle lobe along the right minor fissure, which could represent mild aspiration or infection. Numerous sclerotic and mixed lytic/sclerotic lesions throughout the thoracic spine and bilateral ribs, increased in size and number compared to prior. Appearance is atypical for multiple myeloma but may represent a combination of treated, partially treated, and active disease. No acute pathologic fracture. Findings compatible with enterocolitis of infectious or inflammatory etiology. Involvement of the colon is greater than of the small bowel. Marked hepatic steatosis. Increased sclerosis of bone lesions throughout the thoracolumbar spine, bony pelvis, and proximal left femur. Appearance is atypical for multiple myeloma but may represent a combination of treated, partially treated, and active disease. No acute pathologic fracture.  - 1/29:CMV PCR [id number]  - 1/31: GI PCR panel positive only for Norovirus.   2/1: LP results: Neutrophils 6% in tube 1, Lymphocytes 88% (predominant), protein 42 (normal), glucose 36 (low), rapid HSV in CSF negative. CMV CSF PCR negative    - 2/2 CMV PCR 179,555High   - 2/2 Flex Sig Biopsies prelim: "colonic mucosa with a rare small non-necrotizing mucosal granuloma and rare CMV immunolabeling"   - 2/5 [place] work up: sIL2 (1556.6 high), ferritin (948 high), TG(258 high), NKA pending [ ]    -2/8: CMV PCR: 3107 (sig improvement)  2/10: C diff: negative.  -2/11: Repeat GI viral PCR: Pending.    Plan:  - appreciate ID reccs   - Ganciclovir 5  mg/kg (1/31-). S/p one dose of IVIG on 2/7. Can switch to oral antiviral once the diarrhea improves.              - If again Norovirus positive, can consider another IVIG. Consider touching base with GI.   - GI consult: appreciate reccs: flex sig 2/2    - stop zosyn: s/p IV Zosyn 4.5 g Q8hrs (1/26-1/28, restarted 1/29 given recurrent fever-12/16/21)  - checking weekly CMV, next 2/15 [ ]    - OI ppx: Acyclovir IV    # Grade III neutropenia, at baseline  # Leukopenia  #Thrombocytopenia (possibly 2/2 liver disease)  Data: WBC of 2.2 on 12/05/21, on Zarxio at home  Plan: Continue to monitor    # Acute liver injury of unclear etiology  # Acute on chronic encephalopathy likely 2/2 infectious vs hepatic vs metabolic  # Hyperbilirubinemia  # Anasarca  # Hypoalbuminemia    Data:   -Steady rise in LFTs in 05/2021,  -12/02/21: AST 152--> 121, ALT 130--> 110, TB 5.4, Albumin 1.1  -Hepatitis panel negative.   -1/26:  Tbili 14.6, GGT 1160, ammonia 25  -CTAP 12/02/21: Scattered sclerotic lesions in the lumbar spine, hepatic steatosis.   -RUQ ultrasound: liver enlarged with diffuse increased echogenicity, consistent with fatty infiltration. 2cm stone in the gallbladder.   -HIDA scan: Normal radiotracer filling of the gallbladder is identified, therefore the exam is not consistent with cystic duct obstruction or acute cholecystitis.  - [place] abd 1/27: Hepatomegaly with diffusely increased echogenicity which can be seen with hepatic amyloidosis or steatosis. Please refer to the recently performed biopsy for further details. Mild perihepatic ascites. Patent hepatic vasculature with normal waveforms. Cholelithiasis without acute cholecystitis. Right pleural effusion.  Portal pressures:  Mean right atrial pressure (mmHg): 4  Mean inferior vena cava pressure (mmHg): 5  Mean free hepatic vein pressure (mmHg): 8   Mean wedged hepatic vein pressure (mmHg): 16   - 1/27 Liver Biopsy: Steatohepatitis with pericentral/sinusoidal and periportal  fibrosis. 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 CMV stains: Negative.  - 1/30: Serum studies: Ferritin 2380, iron serum 60, transferrin 46 (low), 93 % sat (high), HEV IgM negative, PCR forVZV negative, HSV negative,CMV+ [id number],EBV negative, AMA negative, Hepatitis B core Ab, surface Ab negative, Hep A IgG +/IgM negative   IgG4 pending [ ]      Plan:   - Hepatology recs: 2/7: Path looks unlikely for vanishing bile duct syndrome. Could be drug related. Increase Ursodiol to 600 mg BID, Check Hepatitis E, TTE. 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 Unlikely diarrhea from Ursodiol.                         - Consider avoiding steroid    - Daily LFT/INR  - RD consult placed. Protein shakes to use. Family requesting appetite stimulant. Potential harm than benefit.    #Acute toxic encephalopathy, improved  In the setting of liver failure, ammonia only 25. LP performed 2/1 to r/o encephalitis.     Data:   1/31: HIV, TSH, Folate, B12 WNL  1/30: CT head WNL other than sinus disease  2/1: LP results: Neutrophils 6% in tube 1, Lymphocytes 88% (predominant), protein 42 (normal), glucose 36 (low), rapid HSV in CSF negative. CMV CSF PCR negative  2/5: Brain MRI: Smooth diffuse dural thickening and enhancement as well as bilateral holohemispheric subdural effusions, which may be on the basis of diminished intracranial pressure following recent lumbar puncture.  -2/8: EEG: non-specific mild cerebral dysfunction.  -2/9: CT Brain: pending.     Plan:  - Neurology consult: 2/7: Encephalopathy likely from underlying medical disease. Delirium prevention. Avoid Ativan and try Seroquel for delirium. MRI findings is from LP. Okay to try caffiene tablets in the daytime to help with postural headache but patient is allergic to it.  -2/8: Repeat CT head to monitor the status of subdural effusion: stable, EEG: diffuse slowing, Thiamine iv prophylaxis.  -Speech  path: Regular and thin liquid. Slightly higher risk for aspiration given encephalopathy.    #Hypokalemia:  #Hypomagnesemia:  Replete as needed.    #Vitamin D deficiency:  -Supplements ordered.    #Moderate malnutrition:  -Seen by RD and given recs for supplements.    # History of PE, diagnosed in April 2019  - hold home [place] for now given recent anemia and concerns for bleeding.   - monitor for signs and symptoms of worsening bleeding.     # Elevated TSH at OSH, likely 2/2 subclinical hypothyroidism  -TSH WNL on 12/15/20.      # Anxiety, emotional distress:   -Patient has stressful social situation, intellectually disabled son, and regarding underlying disease process.     #Chronic buttocks pressure wound  -Wound care consult, appreciate recs  -Position changes as tolerated, waffle cushion, etc    #VTE PPx:  Contraindicated: Anticipated procedure    Severity of Illness     During this hospitalization the patient is also being treated for:  - Sepsis (present on admission)    - Malignancy associated fatigue on admission  - Neoplasm/malignancy associated pain  - Hypoalbuminemia (present on admission)  - Fluid status: Hypovolemia on admission     - Cachexia  - Encephalopathy: Metabolic encephalopathy   --Pressure wound ruled out, treating bilateral buttocks moisture and friction damage     Code Status: FULL    James Ivanoff, MD  12/27/21

## 2021-12-27 NOTE — Consults (Signed)
INFECTIOUS DISEASES FOLLOW UP CONSULT NOTE  ATTENDING ONLY     My date of service is 12/27/2021.    Interim History since previous Infectious Disease note  - Afebrile, WBC 5.6  - Ongoing loose stools  - Potential culprits of diarrhea: possible but lower suspicion per Pharmacy / Hepatology --> repeat stool PCR sent    Subjective  - No fevers, abdominal pain, SOB    Medications  Scheduled Meds:   0.9% sodium chloride flush  10-20 mL Intravenous Q12H [place]    0.9% sodium chloride flush  3 mL Intravenous Q12H [place]    dicyclomine  20 mg Oral 4x Daily [place]    dry mouth oral rinse  5 mL Mucous Membrane 4x Daily [place]    ergocalciferol (vitamin D2)  50,000 Units Oral Q7 Days [place]    ganciclovir (CYTOVENE) IVPB  5 mg/kg (Dosing Weight) Intravenous Q12H [place]    multivitamin complete chewable  1 tablet Oral Daily [place]    nystatin  1 Application Topical BID [place]    thiamine  200 mg Intravenous Q24H    ursodioL  600 mg Oral BID [place]    vitamin E (dl, acetate)  800 Units Oral Daily [place]     Continuous Infusions:   dextrose 5 % and 0.9 % sodium chloride 75 mL/hr (12/27/21 1323)       Antibiotic History  Current  Ganciclovir 1/31-    Prior  Pip-tazo 1/18-1/22, 1/24-1/27, 1/29-1/31  Micafungin 1/31-2/2  CTX 1/19  Vanc 1/18-1/19    Vitals  Temp:  [36.1 C (97 F)-37.6 C (99.7 F)] 36.1 C (97 F)  Heart Rate:  [68-98] 79  *Resp:  [16-18] 16  BP: (92-106)/(62-75) 92/62  SpO2:  [96 %-98 %] 97 %      Intake/Output Summary (Last 24 hours) at 12/27/2021 1414  Last data filed at 12/27/2021 1323  Gross per 24 hour   Intake 2515.44 ml   Output 2350 ml   Net 165.44 ml       Physical Examination  Constitutional: In bed, NAD  HENT: NCAT.  MMM  Eyes: Scleral icterus, EOM normal  Neck: Neck supple.  Cardiovascular: Normal rate, regular rhythm, S1/S2 normal.  No murmurs or friction rub.  Pulmonary/Chest: No respiratory distress. CTAB.  No wheezes or rhonchi.   GI: Soft. +BS.  No distension, tenderness, or rebound. No organomegaly.  Extremities: No  clubbing, cyanosis, or edema.  Neurological: Awake, conversant  Skin: Jaundiced No rashes, lesions, or peripheral stigmata of endocarditis.   Psychiatric: Normal mood and affect.     Lines/tubes: LUE PICC    Data    CBC        12/27/21  0440 12/26/21  0335 12/25/21  0438   WBC 5.6 4.3 3.3*   HGB 8.2* 8.3* 8.0*   HCT 24.4* 25.1* 23.4*   PLT 86* 86* 80*   NEUTA 5.18 3.88 2.92   LYMA 0.20* 0.21* 0.18*   MOA 0.13* 0.12* 0.11*   EOA 0.01 0.00 0.01   BASOA 0.00 0.01 0.00       Chem7        12/27/21  0440 12/26/21  1743 12/26/21  0335   NA 140 140 138   K 2.7* 4.3 3.2*   CL 110 111* 108   CO2 26 22 23    BUN 3* 3* 3*   CREAT 0.36* 0.36* 0.35*   GLU 96 96 89       Liver Panel  12/27/21  0440 12/26/21  0335 12/25/21  0438 12/11/21  0328 12/10/21  0208   AST 66* 81* 67*   < > 101*   ALT 59 57 55   < > 116*   ALKP 506* 507* 485*   < > 745*   TBILI 15.7* 15.5* 15.0*   < > 14.6*   TP 3.5* 3.5* 3.4*   < > 3.2*   ALB 1.3* 1.2* 1.2*   < > 2.0*   GGT  --   --   --   --  1,160*    < > = values in this interval not displayed.       CRP        12/13/21  0302   CRP 11.3*       Microbiology  [place]  2/11 Stool PCR pending  2/10 C diff neg  2/8 CMV PCR 3,107  2/1 CSF   0 WBCs, normal protein, glucose low at 36.  HSV PCR neg  CMV PCR neg  2/1 C diff neg  2/1 GI PCR + norovirus  2/1 CMV PCR 179,555   1/30 UCx >100K C albicans  1/29 UCx >100K C albicans  1/29 cBCx x2 NG  1/29 CMV PCR 818,299  1/29 Hep E IgM neg  1/29 HBeAb neg  1/28 VZV PCR neg  1/28 HSV PCR neg  1/27 HAV IgG positive  1/27 EBV PCR neg  1/27 HBsAg neg, sAb 12  1/26 pBCx x2 NG  1/26 MRSA swab neg  1/26 C19 PCR neg  1/26 UCx >100K C [place]    OSH  1/18 GI PCR: +norovirus  1/18: flu, RSV, covid neg  1/22: CMV PCR positive  1/22: EBV PCR neg  1/24: blood cx x2: neg  1/25: VZV PCR: neg    Radiology Results  No results found.     Pathology  12/17/21 Flex sig path:   FINAL PATHOLOGIC DIAGNOSIS  Colon, biopsy:  Colonic mucosa with a rare small non-necrotizing mucosal  granuloma  and rare CMV immunolabeling; see comment.   COMMENT:   Sections show colonic mucosa with a rare small non-necrotizing  granuloma, but no active colitis.  There is relatively depleted lamina  propria inflammatory cells and no overtly atypical plasma cells,  organisms, or viropathic change.  An initial CMV immunostain shows rare  luminal staining, possibly artifactual, but on repeat CMV stain there is  more convincing labeling of rare epithelial cells, and possibly  endothelial cells and/or lymphocytes, to raise consideration for a CMV  contribution to diarrhea (though a norovirus infection cannot be  excluded).  [place], [name], and GMS stains are negative for organisms.   Adenovirus immunostain is negative.  Kappa/lambda immunostains show  ~2-3:1 ratio (K:L) supporting a polytypic plasma cell     12/11/21 Liver biopsy:  FINAL PATHOLOGIC DIAGNOSIS  Liver, transjugular biopsy:  1.  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  2.  Ductular reaction with focal pericholangitis; see comment.  COMMENT:  The biopsy shows severe steatosis with pericentral/sinusoidal and  periportal fibrosis, histiocytic and neutrophilic lobular inflammation,  Mallory hyaline as well as rare ballooned hepatocytes. 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  Clinical correlation with steatohepatitis risk factors is suggested,  including for any history of massive weight loss which may lead to an  aggressive onset of steatohepatitis [1].  The history of dexamethasone  use is noted; corticosteroid use has been associated with exacerbation  of pre-existing  fatty liver disease [2].  Additionally, there is ductular reaction with focal pericholangitis,  raising consideration for a duct obstructive process. Clinical  correlation is advised. The concern for other drug-induced liver injury  is noted. In particular, thalidomide derivatives (such as pomalidomide)  have been  reported in association with vanishing bile duct syndrome  (VBDS) [3]; however, there is no convincing duct loss to suggest VBDS.  There are no clusters of plasma cells seen to suggest involvement by  myeloma.  Addendum Comment  In order to further evaluate this case, immunohistochemistry for CMV was  performed and evaluated on block A1 as follows:  - CMV:          Negative.      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 Other work-up notable for liver bx CMV IHC neg, CSF PCR neg. On ganciclovir since 1/31 with VL downtrending to 3K 2/8, diarrhea largely improved though with intermittent loose stools that may be multifactorial.    # Diarrhea with CT + enterocolitis: Initially multifactorial with norovirus and CMV involvement. Had improved but now recurred for which ddx includes infection (persistent norovirus, CMV though very little involvement on path) vs non-infectious. Repeat GI PCR panel to check for norovirus    # Encephalopathy: Low suspicion for infection as LP without pleocytosis and negative CMV PCR; MRI Brain without acute findings. Per Neuro, most c/w toxic metabolic.     # Elevated LFTs: 1/27 liver biopsy + steatosis, CMV IHC neg. Per Hepatology, NASH + hypoperfusion + cholestasis.    Dx  - Weekly CMV PCR (next due 2/15) until low level <300 x1  - Follow-up repeat stool GI PCR  I am inquiring with micro about quantitative value  - Consider re-involving GI if diarrhea persists  Rx  - ContinueIV ganciclovir 5 mg/kg IV q12, planned duration 3 weeks and CMV PCR <300 x1  Transition to treatment-dose PO  valganciclovir once diarrhea  resolved    We will continue to follow with you.  Please contact [facility] 1st Call via Voalte with questions.    Murlean Hark, MD  12/27/2021    I spent a total time of 35 minutes in this episode of care preparing to see the patient, obtaining and/or reviewing separately obtained history, performing a medically appropriate examination and/or evaluation, counseling and educating patient/family/caregiver, ordering and reviewing medications, tests, or procedures, referring and communicating with other health care professionals, documenting clinical information in the electronic or other health record, and communicating results to the patient/family/caregiver, and/or other care coordination.

## 2021-12-28 LAB — COMPREHENSIVE METABOLIC PANEL (BMP, AST, ALT, T.BILI, ALKP, TP ALB)
AST: 78 U/L — ABNORMAL HIGH (ref 5–44)
Alanine transaminase: 59 U/L (ref 10–61)
Albumin, Serum / Plasma: 1.3 g/dL — ABNORMAL LOW (ref 3.4–4.8)
Alkaline Phosphatase: 505 U/L — ABNORMAL HIGH (ref 38–108)
Anion Gap: 6 (ref 4–14)
Bilirubin, Total: 15.2 mg/dL — ABNORMAL HIGH (ref 0.2–1.2)
Calcium, total, Serum / Plasma: 7.2 mg/dL — ABNORMAL LOW (ref 8.4–10.5)
Carbon Dioxide, Total: 24 mmol/L (ref 22–29)
Chloride, Serum / Plasma: 110 mmol/L (ref 101–110)
Creatinine: 0.33 mg/dL — ABNORMAL LOW (ref 0.55–1.02)
Glucose, non-fasting: 92 mg/dL (ref 70–199)
Potassium, Serum / Plasma: 3.4 mmol/L — ABNORMAL LOW (ref 3.5–5.0)
Protein, Total, Serum / Plasma: 3.5 g/dL — ABNORMAL LOW (ref 6.3–8.6)
Sodium, Serum / Plasma: 140 mmol/L (ref 135–145)
Urea Nitrogen, Serum / Plasma: 4 mg/dL — ABNORMAL LOW (ref 7–25)
eGFRcr: 118 mL/min/{1.73_m2} (ref >59)

## 2021-12-28 LAB — COMPLETE BLOOD COUNT WITH DIFFERENTIAL
Abs Basophils: 0.01 10*9/L (ref 0.00–0.10)
Abs Eosinophils: 0.01 10*9/L (ref 0.00–0.40)
Abs Imm Granulocytes: 0.09 10*9/L (ref <0.10)
Abs Lymphocytes: 0.28 10*9/L — ABNORMAL LOW (ref 1.00–3.40)
Abs Monocytes: 0.15 10*9/L — ABNORMAL LOW (ref 0.20–0.80)
Abs Neutrophils: 5.51 10*9/L (ref 1.80–6.80)
Hematocrit: 24.5 % — ABNORMAL LOW (ref 36.0–46.0)
Hemoglobin: 8.3 g/dL — ABNORMAL LOW (ref 12.0–15.5)
MCH: 35.6 pg — ABNORMAL HIGH (ref 26.0–34.0)
MCHC: 33.9 g/dL (ref 31.0–36.0)
MCV: 105 fL — ABNORMAL HIGH (ref 80–100)
MPV: 12.7 fL — ABNORMAL HIGH (ref 9.1–12.6)
Platelet Count: 74 10*9/L — ABNORMAL LOW (ref 140–450)
RBC Count: 2.33 10*12/L — ABNORMAL LOW (ref 4.00–5.20)
RDW-CV: 24.5 % — ABNORMAL HIGH (ref 11.7–14.4)
WBC Count: 6.1 10*9/L (ref 3.4–10.0)

## 2021-12-28 LAB — MAGNESIUM, SERUM / PLASMA: Magnesium, Serum / Plasma: 2.2 mg/dL (ref 1.6–2.6)

## 2021-12-28 LAB — BASIC METABOLIC PANEL (NA, K, CL, CO2, BUN, CR, GLU, CA)
Anion Gap: 6 (ref 4–14)
Calcium, total, Serum / Plasma: 7.4 mg/dL — ABNORMAL LOW (ref 8.4–10.5)
Carbon Dioxide, Total: 23 mmol/L (ref 22–29)
Chloride, Serum / Plasma: 112 mmol/L — ABNORMAL HIGH (ref 101–110)
Creatinine: 0.35 mg/dL — ABNORMAL LOW (ref 0.55–1.02)
Glucose, non-fasting: 107 mg/dL (ref 70–199)
Potassium, Serum / Plasma: 3.8 mmol/L (ref 3.5–5.0)
Sodium, Serum / Plasma: 141 mmol/L (ref 135–145)
Urea Nitrogen, Serum / Plasma: 4 mg/dL — ABNORMAL LOW (ref 7–25)
eGFRcr: 116 mL/min/{1.73_m2} (ref >59)

## 2021-12-28 LAB — BILIRUBIN, DIRECT: Bilirubin, Direct: 10.4 mg/dL — ABNORMAL HIGH (ref <0.6)

## 2021-12-28 LAB — FIBRINOGEN, FUNCTIONAL: Fibrinogen, Functional: 153 mg/dL — ABNORMAL LOW (ref 202–430)

## 2021-12-28 LAB — PROTHROMBIN TIME
INR: 1.3 — ABNORMAL HIGH (ref 0.9–1.2)
PT: 15.7 s — ABNORMAL HIGH (ref 11.6–15.0)

## 2021-12-28 LAB — POCT GLUCOSE
Glucose, Glucometer: 93 mg/dL (ref 70–199)
Glucose, Glucometer: 88 mg/dL (ref 70–199)

## 2021-12-28 MED ORDER — POTASSIUM CHLORIDE 40 MEQ/100ML IN STERILE WATER INTRAVENOUS PIGGYBACK
40 | Freq: Once | INTRAVENOUS | Status: AC
Start: 2021-12-28 — End: 2021-12-29
  Administered 2021-12-29: 08:00:00 40 meq via INTRAVENOUS

## 2021-12-28 MED FILL — VITAMIN E (DL, ACETATE) 45 MG (100 UNIT) CAPSULE: 45 mg (100 unit) | ORAL | Qty: 8

## 2021-12-28 MED FILL — GANCICLOVIR SODIUM 500 MG INTRAVENOUS SOLUTION: 500 mg | INTRAVENOUS | Qty: 8

## 2021-12-28 MED FILL — DICYCLOMINE 10 MG CAPSULE: 10 mg | ORAL | Qty: 2

## 2021-12-28 MED FILL — POTASSIUM CHLORIDE 40 MEQ/100ML IN STERILE WATER INTRAVENOUS PIGGYBACK: 40 mEq/100 mL | INTRAVENOUS | Qty: 100

## 2021-12-28 MED FILL — POTASSIUM CHLORIDE 20 MEQ/50 ML IN STERILE WATER INTRAVENOUS PIGGYBACK: 20 mEq/50 mL | INTRAVENOUS | Qty: 50

## 2021-12-28 MED FILL — THIAMINE HCL (VITAMIN B1) 100 MG/ML INJECTION SOLUTION: 100 mg/mL | INTRAMUSCULAR | Qty: 2

## 2021-12-28 MED FILL — URSODIOL 300 MG CAPSULE: 300 mg | ORAL | Qty: 2

## 2021-12-28 MED FILL — DILAUDID (PF) 0.5 MG/0.5 ML INJECTION SYRINGE: 0.5 mg/ mL | INTRAMUSCULAR | Qty: 0.5

## 2021-12-28 MED FILL — FLINTSTONES COMPLETE (IRON) CHEWABLE TABLET: ORAL | Qty: 1

## 2021-12-28 MED FILL — MAGNESIUM SULFATE 2 GRAM/50 ML (4 %) IN WATER INTRAVENOUS PIGGYBACK: 2 gram/50 mL (4 %) | INTRAVENOUS | Qty: 50

## 2021-12-28 NOTE — Consults (Signed)
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 The patient has received VRd X 4 (achieving VGPR) then Mel 200 ASCT 05/12/11. 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 She met the criteria for PD on 02/19/2020 and was started on third line DARA PD in June 2021, which was recently held on 10/20/21 due to chronic diarrhea. Patient was transferred OSH after recent hypovolemic/distributive shock. Here for acute encephalopathy workup and acute liver failure workup    ASSESSMENT  Patient able to perform lower extremity warm up exercises in bed. She continues to be more behavioral, trying to lie herself back down upon sitting EOB requiring Mod-Max A to maintain sitting, and stating that she is not going to get out of bed. Use of ceiling lift to dependently transfer patient to chair. Extensive education given to patient on importance of OOB activity and functional mobility. She will benefit from skilled PT and placement for continued therapy to decrease caregiver burden with functional mobility.    Focus next session: upright tolerance, OOB, therex    RECOMMENDATIONS  DISCHARGE RECOMMENDATION  Comments Placement with ongoing PT needs      Patient Current Functional Status Sufficient for PT Discharge Recommendation Yes   Discharge DME needs TBD pending patient progress.   Discharge transportation needs [place]  Inpatient Rehab Assistive Device Recommendation     Activity Recommendation Bed in chair position, OOB to chair with ceiling lift     SUBJECTIVE  Subjective report:  "You tricked me."  Notable observations:  Supine in bed with HOB elevated at beginning of session. Seated in chair with sling underneath.    SYSTEMS REVIEW  Cognition/Communication  Delirium screen:  Yes Delirium Screen:   Details:  Encephalopathic    Communication  Cognition/communication impaired:   Yes     Communication: Difficulty making basic needs known, Difficulty making complex needs known  Cognition: Decreased attention, Delayed response, Impaired motor planning, Decreased safety awareness, Decreased insight to deficits, Decreased command following  Cognition comment: Able to consistently follow one step commands when she chooses too. Needing repetition of the plan for therapy.  Behavior: Self-limiting  Behavior comment: More behavioral. Stating she is not going to move. Closing eyes and not responding to therapist's questions.    Integumentary  Integumentary deficits:  YesIntegumentary deficit detail:  Jaundiced, edematous, wounds at buttocks, foley    Cardiopulmonary  Cardiopulmonary deficits:  Yes  Detail:  Decreased activity tolerance    Musculoskeletal  Musculoskeletal deficits:  Yes  Abnormal strength findings: Bilateral shoulder flexion 3-/5 bilaterally, elbow flexion at least 3/5 bilaterally. Hip flexion 2+/5 bilaterally, knee extension 2+/5 bilaterally, ankle DF 3-/5 bilaterally    Neuromuscular  Neuromuscular deficits:  Yes  Motor control or coordination: Impaired at bilateral lower extremities >> bilateral upper extremities        Pain     Currently in pain: No          COMPREHENSIVE MOVEMENT ANALYSIS/TREATMENT  Precautions/WB status: Yes  Precautions and weight bearing status comments: delrium, falls      Hemodynamic response:   Normal hemodynamic response: Yes  Response:     Comments: BP stable throughout.      Functional Mobility  Requires second person/additional health care providers: Yes  Type additional health care provider: OT    Bed mobility Current  Initial    Supine < > Sit  Level of assist  Maximal assist;Two person assist  Dependent assist;Two person assist (12/15/21 0932)   Device  Head of bed elevated;Increased time  Through long sitting;Bed rail;Head of bed elevated (12/13/21 1430)   Intervention   Verbal cues;Tactile cues;External cues (12/13/21 1430)   Supine<>sit comments:    Max A x 2 supine>sit via L sidelying. Patient trying to lie herself down. Mod-Max A to maintain upright sitting. Educated at length on importance of OOB activity.    Transfer Current Initial   Sit < > Stand  Level of assist   Maximal assist;Two person assist (12/16/21 0930)   Device     Intervention     Transfer  Level of assist  From: Bed  To: Chair  Dependent assist;Two person assist  Dependent assist (12/18/21 1610)   Device  Vertical dependent lift  Vertical dependent lift (12/18/21 9604)   Intervention   Verbal cues;Tactile cues (12/18/21 0938)   Sit < > Stand comments:   Transfer comments:   Donned sling sitting EOB and use of ceiling lift to transfer to chair. Patient was not going to assist with transfer using STEDY.    Balance  Balance deficits noted: Yes  Functional Balance for ADLs  Position Static Dynamic   Sit Static sitting level of assist: Moderate assist-   Static sitting comment: At best CGA. Patient trying to lie herself down sitting EOB requiring Mod-Max A to maintain sitting. Dynamic sitting level of assist: Maximal assist-  Dynamic sitting comment: scooting anteriorly       Exercise/other interventions  Supine exercises comment (other exercise, details): ankle pumps x 10 bilaterally, hip and knee flexion with active assist x 10 bilaterally, hip abduction<>adduction x 10 with tactile cues  Other exercise: Extensive education on importance of upright activity and OOB activity to improve patient's functional mobility    Communication between other health care providers: Communication between other health care provider: OT;RN  Communication comment:       Education Careers adviser: Patient  Content: Plan of care, Activity recommendations  Response: Needs reinforcement  Outcome measures   Physical Therapist Global Assessment of Mobility  Activity Achieved: Passive transfer to bed  AMPAC 6-clicks basic mobility score: 9      PLAN  Plan of care status:  Current plan of care remains appropriate  PT  frequency:  5x/week  PT duration:  4 weeks.  Comment:  POC expires 2/26      Planned PT interventions:   Specific interventions: Progressive functional mobility training;Gait training;Balance training;NM Re-ed;Aerobic training;Ther ex  Education interventions: Caregiver training;Benefits of activity;Self-pacing/breathing;Exercise program;Mobility with precautions compliance;Fall risk reduction  Comment:          Leotis Shames, PT, DPT, NCS    12/28/2021

## 2021-12-28 NOTE — Consults (Signed)
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 The patient has received VRd X 4 (achieving VGPR) then Mel 200 ASCT 05/12/11. 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 She met the criteria for PD on 02/19/2020 and was started on third line DARA PD in June 2021, which was recently held on 10/20/21 due to chronic diarrhea. Patient was transferred OSH after recent hypovolemic/distributive shock. Here for acute encephalopathy workup and acute liver failure workup.    Assessment and Treatment Plan    OT Progress summary: Pt continues to make improvements in arousal, attention and cognition however now participating less, more behavioral. Pt continues to p/w impaired reasoning and executive function. Provided education at length regarding pt need to progress activity, engage in ADLs OOB in order to be able to return home. Pt demosntrated improved movement in LEs however not participating in bed mobility. Pt requires max A for bed mobility with 2p. PT required dependent transfer to chair via ceiling lift. Recommend placement at this time.  Progress with OT: Slow progress  Current maximal level of assist: Maximal assist  Next session focus:      Recommendations    Recommended Discharge Disposition Placement for continued therapy   Discharge DME Recommendations Defer to next level of care;To be determined   Equipment Recommendations     DME Comment     Discharge Recommendations Comment     Rehab Potential Patient participates well in therapy and is progressing towards goal   Anticipated Assistance Available at Discharge Family;Spouse/Significant other   Anticipated Type of Assistance Available at Discharge Assist as needed   Anticipated Time of Assistance Available at Discharge     Barriers to Discharge Medical issues;Impaired Cognition;Insufficient activity tolerance   Patient's current  functional ability appropriate for D/C recommendations No     Inpatient Recommendations  OT Inpatient Recommendations     Recommendation Comments Sling to chair and 2p assist   Current Maximal Level of Assist Needed Maximal assist     Brace/Precautions   Brace/Orthotic/Prosthetic:   Precautions:Has weight bearing limitation or precaution: Yes  Precautions and weight bearing status comments: delrium, falls     Subjective Report:"I just want to rest"    Patient/Family Goal:     Objective Findings and Interventions    Areas of Occupation    Grooming and Light Hygiene     Cueing Required     Comment     Self Feeding     Cueing Required     Comment     Toileting     Cueing Required     Toileting Clothing Management     Cueing Required     Comment     Upper Body Dressing     Cueing Required     Comment     Lower Body Dressing     Cueing Required     Comment     Upper Body Bathing Maximal assistance   Cueing Required     Comment wash back   Lower Body Bathing     Cueing Required     Comment     ADL/IADL Comment         Functional Transfers    Bed Mobility From Supine   Bed Mobility To Sitting EOB   Level of Assist Maximal assistance   Cueing Required     Transfer  From Bed   Transfer To Chair   Level of Assist Dependent   Cueing Required     Technique     Functional Transfer Comment Pt p/w delayed processing, more communication today. Pt educated at length regarding OOB activity and necessity to get to chair. Pt contiues to report fatige, and requesting to remain in bed despite being in bed for two days. max A 2p EOB, attempting to lay back down min mod A to correct. Dependent hoyer transfer to chair.     Functional Balance for ADLs  Position Static Dynamic   Sit Static sitting level of assist: Moderate assist-      -        Stand  -      -            KB Home	[place] AM-PAC for ADLs  6 Click Score: 11    Client Factors  Cognitive deficits noted in the following area(s):: Attention, Memory, Judgement/Safety  Cognition  comment:More alert improved cognition however now refusing to participate more                                                   OT Education       OT Plan of [place], OT  12/28/2021 2:29 PM

## 2021-12-28 NOTE — Progress Notes (Signed)
[facility] PROGRESS NOTE  ATTENDING ONLY     My date of service is 12/28/21.    24 Hour Course  -Colonic biopsy: Colonic mucosa with a rare small non-necrotizing mucosal  granuloma and rare CMV immunolabeling  -Hepatology follow up: CMV stain negative. Probable liver injury from hypotension and possible exposure to drugs that are prone to liver toxicity. Increase Ursodiol to 600 mg BID, check Hepatitis e and TTE. 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  -Diarrhea not likely from Ursodiol. C diff negative, Rpt GI viral PCR pending.    Subjective  Pt is feeling well. Mental status improved significantly. Otherwise no focal symptoms. Actively asking more questions today and is remembering older facts. Diarrhea improving. Rash in the inner thigh noted.    Vitals  Temp:  [36.4 C (97.5 F)-37 C (98.6 F)] 36.9 C (98.4 F)  Heart Rate:  [71-88] 88  *Resp:  [16-18] 16  BP: (97-105)/(61-71) 105/62  SpO2:  [93 %-99 %] 98 %    MostRecent Weight: 84.6 kg (186 lb 8.2 oz)  Admission Weight: 79.6 kg (175 lb 7.8 oz)      Intake/Output Summary (Last 24 hours) at 12/28/2021 1722  Last data filed at 12/28/2021 1521  Gross per 24 hour   Intake 2710.68 ml   Output 2325 ml   Net 385.68 ml       Pain Score: 0    Physical Exam  Appearance: NAD.      Comments: Alert and oriented X 3 slow to respond. Lacks insight    HENT:      Head: Normocephalic and atraumatic.      Nose: Nose normal.   Neck:  R IJ CVC with dressing in place, c/d/i  Eyes:      General: Scleral icterus present.      Extraocular Movements: Extraocular movements intact.      Conjunctiva/sclera: Conjunctivae normal.      Pupils: Pupils are equal, round, and reactive to light.   Cardiovascular:      Rate and Rhythm: Normal rate and regular rhythm.      Pulses: Normal pulses.      Heart sounds: Normal heart sounds. No murmur heard.    No friction rub. No gallop.   Pulmonary:      Effort: Pulmonary effort is normal. No respiratory distress.      Breath sounds:  Normal breath sounds. No stridor. No wheezing, rhonchi or rales.   Abdominal:      General: Abdomen is flat. Bowel sounds are normal. There is no distension.      Palpations: Abdomen is soft. There is no mass.      Tenderness: There is no abdominal tenderness. There is no guarding or rebound.      Hernia: No hernia is present.      Comments: Foley catheter in place, urine appears dark  No suprapubic tenderness.   Musculoskeletal:         General: Normal range of motion.      Cervical back: Normal range of motion and neck supple.      Right lower leg: Edema present.      Left lower leg: Edema present.      Comments: Strength 1/5 in the bilateral lower extremities, unable to raise them to gravity.   Pitting edema 3-4+ in the bilateral lower extremities.   Anasarca in the abdomen and thighs bilaterally.    Skin:     General: Skin is warm.  Capillary Refill: Capillary refill takes less than 2 seconds.      Coloration: Skin is jaundiced.      Findings: Bruising present.      Comments: ecchymoses on the sternum   Neurological:      Mental Status: She is alert. She is disoriented.      Motor: Weakness present.       Scheduled Meds:   0.9% sodium chloride flush  10-20 mL Intravenous Q12H [place]    0.9% sodium chloride flush  3 mL Intravenous Q12H SCH    dicyclomine  20 mg Oral 4x Daily [place]    dry mouth oral rinse  5 mL Mucous Membrane 4x Daily [place]    ergocalciferol (vitamin D2)  50,000 Units Oral Q7 Days [place]    ganciclovir (CYTOVENE) IVPB  5 mg/kg (Dosing Weight) Intravenous Q12H [place]    multivitamin complete chewable  1 tablet Oral Daily [place]    nystatin  1 Application Topical BID [place]    thiamine  200 mg Intravenous Q24H    ursodioL  600 mg Oral BID [place]    vitamin E (dl, acetate)  800 Units Oral Daily [place]     Continuous Infusions:   dextrose 5 % and 0.9 % sodium chloride 75 mL/hr (12/28/21 1126)     PRN Meds:   0.9% sodium chloride flush  10-20 mL Intravenous PRN    0.9% sodium chloride flush  3 mL Intravenous  PRN    albuterol  2.5 mg Nebulization Q4H PRN    Or    albuterol  2.5 mg Nebulization Q4H PRN    dextrose 50%  12.5 g Intravenous Q15 Min PRN    Or    glucose  20 g Oral Q15 Min PRN    Or    juice (for hypoglycemia)  16 g Oral Q15 Min PRN    diphenhydrAMINE  50 mg Intravenous Once PRN    EPINEPHrine  0.3 mg Intramuscular Once PRN    hydrocortisone  100 mg Intravenous Once PRN    HYDROmorphone  0.3-0.8 mg Intravenous Q4H PRN    LORazepam  0.5 mg Oral Once PRN    magnesium sulfate in dextrose 5 %  2-4 g Intravenous Daily PRN    Or    magnesium sulfate in water  2-4 g Intravenous Daily PRN    metoclopramide  5-10 mg Intravenous Q8H PRN    ondansetron  8 mg Intravenous Q8H PRN    polyethylene glycol  17 g Oral Daily PRN    potassium chloride in sterile water  20-80 mEq Intravenous Daily PRN    Or    potassium chloride in sterile water  20-80 mEq Intravenous Daily PRN    Or    potassium chloride  20-80 mEq Oral Daily PRN       Data    CBC        12/28/21  0432   WBC 6.1   HGB 8.3*   HCT 24.5*   PLT 74*     Coags        12/28/21  0432   INR 1.3*     Chem7        12/28/21  0432 12/27/21  1603   NA 140 141   K 3.4* 3.8   CL 110 112*   CO2 24 23   BUN 4* 4*   CREAT 0.33* 0.35*   GLU 92 107     Electrolytes        12/28/21  3244 12/27/21  1603   CA 7.2* 7.4*   MG 2.2  --      Liver Panel        12/28/21  0432   AST 78*   ALT 59   ALKP 505*   TBILI 15.2*   TP 3.5*   ALB 1.3*     Lactate  No results found in last 36 hours    Microbiology Results (last 24 hours)     Procedure Component Value Units Date/Time    GI Viral Panel PCR [[id number]] Collected: 12/27/21 1850    Order Status: Sent Specimen: Not Applicable from Stool Updated: 12/28/21 0256          Radiology Results  No results found.     I discussed with Dr. Rory Percy from [facility] regarding A&P.    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 The patient has received VRd X 4 (achieving VGPR) then Mel 200 ASCT  05/12/11. 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 She met the criteria for PD on 02/19/2020 and was started on third line DARA PD in June 2021, which was recently held on 10/20/21 due to chronic diarrhea. Patient was transferred OSH after recent hypovolemic/distributive shock. Here for acute encephalopathy workup and acute liver failure workup.      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   June 2020 due to progression of M spike Revlimid was increased to 15 mg PO D1-21 every 28 days, plus dexamethasone  July 2021: started daratumumab, Pomylast 2 mg D 1-21, Q28 days, dexamethasone 12 mg PO Q weekly.   Primary oncologist: Dr. Iona Coach,  Dr. Kennon Rounds [place] [facility]  Receiving dexamethasone 12 mg weekly for diarrhea  Receiving MT with daratumumab with pomalyst and dex, most recent dose 10/16/21, has since been on hold due to worsening diarrhea.     DATA:   09/07/2010: IgG Kappa MM  12/02/2021: Ig M <5, IgG 447, IgA 20, Ig E <2, SFLC Kappa 16.6, Lambda 1.6, Kappa/Lamda 10.38  12/14/21: IgG K on IFE, IgG 430, KLC 16.6, LLC 2.7, KLR 6.15    Chemo:   Holding Daratumumab and Pomalyst, last dose 10/20/21, given recent infection and acute liver failure.    Supportive Care:   Transfusion: Keep Hg >7, and Platelets >10.   Per daughter, has autoantibodies in blood was a difficult match. Would like to be contacted prior to transfusion.   Line: PICC placed 1/26  Outpatient Oncologist: Dr. Kennon Rounds    Dispo: TBD, after resolution of sepsis and liver failure    # Immunocompromised  #CMV colitis  # At risk for infections due to malignancy  # Shock, likely hypovolemic vs distributive in the setting of chronic diarrhea, + norovirus infection   # Acute on chronic diarrhea,  norovirus positive., diff and OP negative.   -No prior evidence of adrenal insufficiency, AM cortisol levels normal, previously on dexamethasone 8 mg PO Qweekly, but has been held since early December. CMV PCR on 1/    Data:  - Ucx: pending  - Bcx: pending  - CT brain: No acute intracranial hemorrhage, herniation, or hydrocephalus.  - CT chest/A/P pending: Medium-sized bilateral pleural effusions  with bilateral peribronchovascular groundglass opacities, suggestive of pulmonary edema. Clustered nodules in right middle lobe along the right minor fissure, which could represent mild aspiration or infection. Numerous sclerotic and mixed lytic/sclerotic lesions throughout the thoracic spine and bilateral ribs, increased in size and number compared to prior. Appearance is atypical for multiple myeloma but may represent a combination of treated, partially treated, and active disease. No acute pathologic fracture. Findings compatible with enterocolitis of infectious or inflammatory etiology. Involvement of the colon is greater than of the small bowel. Marked hepatic steatosis. Increased sclerosis of bone lesions throughout the thoracolumbar spine, bony pelvis, and proximal left femur. Appearance is atypical for multiple myeloma but may represent a combination of treated, partially treated, and active disease. No acute pathologic fracture.  - 1/29:CMV PCR [id number]  - 1/31: GI PCR panel positive only for Norovirus.   2/1: LP results: Neutrophils 6% in tube 1, Lymphocytes 88% (predominant), protein 42 (normal), glucose 36 (low), rapid HSV in CSF negative. CMV CSF PCR negative    - 2/2 CMV PCR 179,555High   - 2/2 Flex Sig Biopsies prelim: "colonic mucosa with a rare small non-necrotizing mucosal granuloma and rare CMV immunolabeling"   - 2/5 [place] work up: sIL2 (1556.6 high), ferritin (948 high), TG(258 high), NKA pending [ ]    -2/8: CMV PCR: 3107 (sig improvement)  2/10: C diff: negative.  -2/11: Repeat GI viral PCR:  Pending.    Plan:  - appreciate ID reccs   - Ganciclovir 5 mg/kg (1/31-). S/p one dose of IVIG on 2/7. Can switch to oral antiviral once the diarrhea improves.              - If again Norovirus positive, can consider another IVIG. Consider touching base with GI.   - GI consult: appreciate reccs: flex sig 2/2    - stop zosyn: s/p IV Zosyn 4.5 g Q8hrs (1/26-1/28, restarted 1/29 given recurrent fever-12/16/21)  - checking weekly CMV, next 2/15 [ ]    - OI ppx: Acyclovir IV    # Grade III neutropenia, at baseline  # Leukopenia  #Thrombocytopenia (possibly 2/2 liver disease)  Data: WBC of 2.2 on 12/05/21, on Zarxio at home  Plan: Continue to monitor    # Acute liver injury of unclear etiology  # Acute on chronic encephalopathy likely 2/2 infectious vs hepatic vs metabolic  # Hyperbilirubinemia  # Anasarca  # Hypoalbuminemia    Data:   -Steady rise in LFTs in 05/2021,  -12/02/21: AST 152--> 121, ALT 130--> 110, TB 5.4, Albumin 1.1  -Hepatitis panel negative.   -1/26:  Tbili 14.6, GGT 1160, ammonia 25  -CTAP 12/02/21: Scattered sclerotic lesions in the lumbar spine, hepatic steatosis.   -RUQ ultrasound: liver enlarged with diffuse increased echogenicity, consistent with fatty infiltration. 2cm stone in the gallbladder.   -HIDA scan: Normal radiotracer filling of the gallbladder is identified, therefore the exam is not consistent with cystic duct obstruction or acute cholecystitis.  - [place] abd 1/27: Hepatomegaly with diffusely increased echogenicity which can be seen with hepatic amyloidosis or steatosis. Please refer to the recently performed biopsy for further details. Mild perihepatic ascites. Patent hepatic vasculature with normal waveforms. Cholelithiasis without acute cholecystitis. Right pleural effusion.  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 CMV stains: Negative.  - 1/30: Serum studies: Ferritin 2380, iron serum 60, transferrin 46 (low), 93 % sat (high), HEV IgM negative, PCR forVZV negative, HSV negative,CMV+ 443,961High,EBV negative, AMA negative, Hepatitis B core Ab, surface Ab negative, Hep A IgG +/IgM negative   IgG4 pending [ ]      Plan:   - Hepatology recs: 2/7: Path looks unlikely for vanishing bile duct syndrome. Could be drug related. Increase Ursodiol to 600 mg BID, Check Hepatitis E. 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 Unlikely diarrhea from Ursodiol.                         - Consider avoiding steroid    - Daily LFT/INR  - RD consult placed. Protein shakes to use. Family requesting appetite stimulant. Potential harm than benefit. Pt starting to eat better now.    #Acute toxic encephalopathy, improved  In the setting of liver failure, ammonia only 25. LP performed 2/1 to r/o encephalitis.     Data:   1/31: HIV, TSH, Folate, B12 WNL  1/30: CT head WNL other than sinus disease  2/1: LP results: Neutrophils 6% in tube 1, Lymphocytes 88% (predominant), protein 42 (normal), glucose 36 (low), rapid HSV in CSF negative. CMV CSF PCR negative  2/5: Brain MRI: Smooth diffuse dural thickening and enhancement as well as bilateral holohemispheric subdural effusions, which may be on the basis of diminished intracranial pressure following recent lumbar puncture.  -2/8: EEG: non-specific mild cerebral dysfunction.  -2/9: CT Brain: pending.     Plan:  - Neurology consult: 2/7: Encephalopathy likely from underlying medical disease. Delirium prevention. Avoid Ativan and try Seroquel for delirium. MRI findings is from LP. Okay to try caffiene tablets in the daytime to help with postural headache but patient is allergic to it.  -2/8: Repeat CT head to monitor the status of  subdural effusion: stable, EEG: diffuse slowing, Thiamine iv prophylaxis.  -Speech path: Regular and thin liquid. Slightly higher risk for aspiration given encephalopathy.    #Hypokalemia:  #Hypomagnesemia:  Replete as needed.    #Vitamin D deficiency:  -Supplements ordered.    #Moderate malnutrition:  -Seen by RD and given recs for supplements.    # History of PE, diagnosed in April 2019  - hold home [place] for now given recent anemia and concerns for bleeding.   - monitor for signs and symptoms of worsening bleeding.     # Elevated TSH at OSH, likely 2/2 subclinical hypothyroidism  -TSH WNL on 12/15/20.      # Anxiety, emotional distress:   -Patient has stressful social situation, intellectually disabled son, and regarding underlying disease process.     #Chronic buttocks pressure wound  -Wound care consult, appreciate recs  -Position changes as tolerated, waffle cushion, etc    #VTE PPx:  Contraindicated: Anticipated procedure    Severity of Illness     During this hospitalization the patient is also being treated for:  - Sepsis (present on admission)    - Malignancy associated fatigue on admission  - Neoplasm/malignancy associated pain  - Hypoalbuminemia (present on admission)  - Fluid status: Hypovolemia on admission     - Cachexia  - Encephalopathy: Metabolic encephalopathy   --Pressure wound ruled out, treating bilateral buttocks moisture and friction damage     Code Status: FULL    James Ivanoff, MD  12/28/21

## 2021-12-29 LAB — COMPREHENSIVE METABOLIC PANEL (BMP, AST, ALT, T.BILI, ALKP, TP ALB)
AST: 81 U/L — ABNORMAL HIGH (ref 5–44)
Alanine transaminase: 62 U/L — ABNORMAL HIGH (ref 10–61)
Albumin, Serum / Plasma: 1.2 g/dL — ABNORMAL LOW (ref 3.4–4.8)
Alkaline Phosphatase: 513 U/L — ABNORMAL HIGH (ref 38–108)
Anion Gap: 6 (ref 4–14)
Bilirubin, Total: 14.6 mg/dL — ABNORMAL HIGH (ref 0.2–1.2)
Calcium, total, Serum / Plasma: 7.6 mg/dL — ABNORMAL LOW (ref 8.4–10.5)
Carbon Dioxide, Total: 24 mmol/L (ref 22–29)
Chloride, Serum / Plasma: 110 mmol/L (ref 101–110)
Creatinine: 0.34 mg/dL — ABNORMAL LOW (ref 0.55–1.02)
Glucose, non-fasting: 94 mg/dL (ref 70–199)
Potassium, Serum / Plasma: 3.3 mmol/L — ABNORMAL LOW (ref 3.5–5.0)
Protein, Total, Serum / Plasma: 3.6 g/dL — ABNORMAL LOW (ref 6.3–8.6)
Sodium, Serum / Plasma: 140 mmol/L (ref 135–145)
Urea Nitrogen, Serum / Plasma: 3 mg/dL — ABNORMAL LOW (ref 7–25)
eGFRcr: 117 mL/min/{1.73_m2} (ref >59)

## 2021-12-29 LAB — COMPLETE BLOOD COUNT WITH DIFFERENTIAL
Abs Basophils: 0.01 10*9/L (ref 0.00–0.10)
Abs Eosinophils: 0.01 10*9/L (ref 0.00–0.40)
Abs Imm Granulocytes: 0.08 10*9/L (ref <0.10)
Abs Lymphocytes: 0.25 10*9/L — ABNORMAL LOW (ref 1.00–3.40)
Abs Monocytes: 0.2 10*9/L (ref 0.20–0.80)
Abs Neutrophils: 7.3 10*9/L — ABNORMAL HIGH (ref 1.80–6.80)
Hematocrit: 24.6 % — ABNORMAL LOW (ref 36.0–46.0)
Hemoglobin: 8.3 g/dL — ABNORMAL LOW (ref 12.0–15.5)
MCH: 35.3 pg — ABNORMAL HIGH (ref 26.0–34.0)
MCHC: 33.7 g/dL (ref 31.0–36.0)
MCV: 105 fL — ABNORMAL HIGH (ref 80–100)
MPV: 13.2 fL — ABNORMAL HIGH (ref 9.1–12.6)
Platelet Count: 76 10*9/L — ABNORMAL LOW (ref 140–450)
RBC Count: 2.35 10*12/L — ABNORMAL LOW (ref 4.00–5.20)
RDW-CV: 24.5 % — ABNORMAL HIGH (ref 11.7–14.4)
WBC Count: 7.9 10*9/L (ref 3.4–10.0)

## 2021-12-29 LAB — BILIRUBIN, DIRECT: Bilirubin, Direct: 10.6 mg/dL — ABNORMAL HIGH (ref <0.6)

## 2021-12-29 LAB — MAGNESIUM, SERUM / PLASMA: Magnesium, Serum / Plasma: 1.9 mg/dL (ref 1.6–2.6)

## 2021-12-29 LAB — COVID-19 RNA, RT-PCR/NUCLEIC ACID AMPLIFICATION: COVID-19 RNA, RT-PCR/Nucleic Acid Amplification: NOT DETECTED

## 2021-12-29 LAB — BASIC METABOLIC PANEL (NA, K, CL, CO2, BUN, CR, GLU, CA)
Anion Gap: 5 (ref 4–14)
Calcium, total, Serum / Plasma: 7.2 mg/dL — ABNORMAL LOW (ref 8.4–10.5)
Carbon Dioxide, Total: 24 mmol/L (ref 22–29)
Chloride, Serum / Plasma: 111 mmol/L — ABNORMAL HIGH (ref 101–110)
Creatinine: 0.36 mg/dL — ABNORMAL LOW (ref 0.55–1.02)
Glucose, non-fasting: 99 mg/dL (ref 70–199)
Potassium, Serum / Plasma: 3.6 mmol/L (ref 3.5–5.0)
Sodium, Serum / Plasma: 140 mmol/L (ref 135–145)
Urea Nitrogen, Serum / Plasma: 4 mg/dL — ABNORMAL LOW (ref 7–25)
eGFRcr: 115 mL/min/{1.73_m2} (ref >59)

## 2021-12-29 LAB — POCT GLUCOSE
Glucose, Glucometer: 84 mg/dL (ref 70–199)
Glucose, Glucometer: 105 mg/dL (ref 70–199)

## 2021-12-29 LAB — PROTHROMBIN TIME
INR: 1.3 — ABNORMAL HIGH (ref 0.9–1.2)
PT: 15.7 s — ABNORMAL HIGH (ref 11.6–15.0)

## 2021-12-29 LAB — FIBRINOGEN, FUNCTIONAL: Fibrinogen, Functional: 164 mg/dL — ABNORMAL LOW (ref 202–430)

## 2021-12-29 MED ORDER — POTASSIUM CHLORIDE 40 MEQ/100ML IN STERILE WATER INTRAVENOUS PIGGYBACK
40 | Freq: Once | INTRAVENOUS | Status: AC
Start: 2021-12-29 — End: 2021-12-29
  Administered 2021-12-30: 04:00:00 40 meq via INTRAVENOUS

## 2021-12-29 MED ORDER — RIFAXIMIN 200 MG TABLET
200 | Freq: Two times a day (BID) | ORAL | Status: DC
Start: 2021-12-29 — End: 2021-12-31
  Administered 2021-12-29 – 2021-12-31 (×5): via ORAL

## 2021-12-29 MED ORDER — QUETIAPINE 25 MG TABLET
25 mg | Freq: Once | ORAL | Status: AC
Start: 2021-12-29 — End: 2022-01-15

## 2021-12-29 MED FILL — POTASSIUM CHLORIDE 40 MEQ/100ML IN STERILE WATER INTRAVENOUS PIGGYBACK: 40 mEq/100 mL | INTRAVENOUS | Qty: 100

## 2021-12-29 MED FILL — DICYCLOMINE 10 MG CAPSULE: 10 mg | ORAL | Qty: 2

## 2021-12-29 MED FILL — DILAUDID (PF) 0.5 MG/0.5 ML INJECTION SYRINGE: 0.5 mg/ mL | INTRAMUSCULAR | Qty: 0.5

## 2021-12-29 MED FILL — MAGNESIUM SULFATE 2 GRAM/50 ML (4 %) IN WATER INTRAVENOUS PIGGYBACK: 2 gram/50 mL (4 %) | INTRAVENOUS | Qty: 50

## 2021-12-29 MED FILL — ONDANSETRON HCL (PF) 4 MG/2 ML INJECTION SOLUTION: 4 mg/2 mL | INTRAMUSCULAR | Qty: 4

## 2021-12-29 MED FILL — METOCLOPRAMIDE 5 MG/ML INJECTION SOLUTION: 5 mg/mL | INTRAMUSCULAR | Qty: 2

## 2021-12-29 MED FILL — VITAMIN E (DL, ACETATE) 45 MG (100 UNIT) CAPSULE: 45 mg (100 unit) | ORAL | Qty: 8

## 2021-12-29 MED FILL — POTASSIUM CHLORIDE 20 MEQ/50 ML IN STERILE WATER INTRAVENOUS PIGGYBACK: 20 mEq/50 mL | INTRAVENOUS | Qty: 50

## 2021-12-29 MED FILL — THIAMINE HCL (VITAMIN B1) 100 MG/ML INJECTION SOLUTION: 100 mg/mL | INTRAMUSCULAR | Qty: 2

## 2021-12-29 MED FILL — FLINTSTONES COMPLETE (IRON) CHEWABLE TABLET: ORAL | Qty: 1

## 2021-12-29 MED FILL — URSODIOL 300 MG CAPSULE: 300 mg | ORAL | Qty: 2

## 2021-12-29 MED FILL — GANCICLOVIR SODIUM 500 MG INTRAVENOUS SOLUTION: 500 mg | INTRAVENOUS | Qty: 8

## 2021-12-29 MED FILL — XIFAXAN 200 MG TABLET: 200 mg | ORAL | Qty: 1

## 2021-12-29 NOTE — Consults (Signed)
Hepatology/Liver Transplant Service Consultation Note     24 Hour Course  - LFTs slowly improving    Subjective  [name]     Objective  Current Facility-Administered Medications   Medication Dose Route Frequency Provider Last Rate Last Admin    0.9% sodium chloride flush injection syringe 10-20 mL  10-20 mL Intravenous Q12H [facility] Zena Amos, DO   20 mL at 12/29/21 0951    0.9% sodium chloride flush injection syringe 10-20 mL  10-20 mL Intravenous PRN Zena Amos, DO   10 mL at 12/19/21 2053    0.9% sodium chloride flush injection syringe 3 mL  3 mL Intravenous Q12H [facility] Carmine Savoy, MD   3 mL at 12/19/21 2054    0.9% sodium chloride flush injection syringe 3 mL  3 mL Intravenous PRN Carmine Savoy, MD        albuterol (PROVENTIL) 2.5 mg /3 mL (0.083 %) inhalation solution 2.5 mg  2.5 mg Nebulization Q4H PRN Vinodhini Arjunan, MD        Or    albuterol (PROVENTIL) inhalation solution 2.5 mg  2.5 mg Nebulization Q4H PRN Vinodhini Arjunan, MD        dextrose 5 % and 0.9 % sodium chloride infusion  75 mL/hr Intravenous Continuous Zena Amos, DO 75 mL/hr at 12/29/21 0001 75 mL/hr at 12/29/21 0001    dextrose 50% injection syringe 12.5 g  12.5 g Intravenous Q15 Min PRN Zena Amos, DO   12.5 g at 12/21/21 0940    Or    glucose chewable tablet 20 g  20 g Oral Q15 Min PRN Zena Amos, DO        Or    juice (for hypoglycemia) 16 g  16 g Oral Q15 Min PRN Zena Amos, DO        dicyclomine (BENTYL) capsule 20 mg  20 mg Oral 4x Daily [facility] Vinodhini Arjunan, MD   20 mg at 12/29/21 0944    diphenhydrAMINE (BENADRYL) injection 50 mg  50 mg Intravenous Once PRN Vinodhini Arjunan, MD        dry mouth oral rinse (BIOTENE DRY MOUTH) solution 5 mL  5 mL Mucous Membrane 4x Daily [facility] Farrel Conners, MD   5 mL at 12/29/21 0934    EPINEPHrine (ADRENALIN) 1  mg/mL (1 mL) injection 0.3 mg  0.3 mg Intramuscular Once PRN Vinodhini Arjunan, MD        ergocalciferol (vitamin D2) (ERGOCALCIFEROL) capsule 50,000 Units  50,000 Units Oral Q7 Days [facility] Vinodhini Arjunan, MD   50,000 Units at 12/23/21 1546    ganciclovir (CYTOVENE) 400 mg in dextrose 5% 100 mL IVPB  5 mg/kg (Dosing Weight) Intravenous Q12H [facility] Farrel Conners, MD   Ended at 12/29/21 1035    hydrocortisone sodium succinate (SOLU-CORTEF) 100 mg/2 mL injection 100 mg  100 mg Intravenous Once PRN Vinodhini Arjunan, MD        HYDROmorphone (DILAUDID) injection syringe 0.3-0.8 mg  0.3-0.8 mg Intravenous Q4H PRN Deboraha Sprang, MD   0.3 mg at 12/29/21 1200    LORazepam (ATIVAN) tablet 0.5 mg  0.5 mg Oral Once PRN Sandria Manly, MD        magnesium sulfate in dextrose 5 % 1 g/100 mL IVPB 2-4 g  2-4 g Intravenous Daily PRN Bernestine Amass, MD 200 mL/hr at 12/25/21 1702 1 g at 12/25/21 1702  Or    magnesium sulfate in water 2 gram/50 mL (4 %) IVPB 2-4 g  2-4 g Intravenous Daily PRN Bernestine Amass, MD 50 mL/hr at 12/29/21 0601 2 g at 12/29/21 0601    metoclopramide (REGLAN) injection 5-10 mg  5-10 mg Intravenous Q8H PRN Bernestine Amass, MD   5 mg at 12/29/21 0939    multivitamin complete chewable (FLINTSTONE'S COMPLETE) tablet 1 tablet  1 tablet Oral Daily [facility] Vinodhini Arjunan, MD   1 tablet at 12/29/21 0943    nystatin (MYCOSTATIN) powder 1 Application  1 Application Topical BID [facility] Vinodhini Arjunan, MD   1 Application at 11/91/47 0952    ondansetron (ZOFRAN) injection 8 mg  8 mg Intravenous Q8H PRN Zena Amos, DO   8 mg at 12/29/21 8295    polyethylene glycol (MIRALAX) packet 17 g  17 g Oral Daily PRN Carmine Savoy, MD        potassium chloride in sterile water 20 mEq/50 mL IVPB 20-80 mEq  20-80 mEq Intravenous Daily PRN Bernestine Amass, MD 50 mL/hr at 12/28/21 1034 20 mEq at 12/28/21 1034    Or    potassium chloride in sterile water 40 mEq/100 mL IVPB 20-80 mEq  20-80 mEq Intravenous Daily PRN Bernestine Amass,  MD 50 mL/hr at 12/29/21 0601 40 mEq at 12/29/21 0601    Or    potassium chloride (KLOR-CON) ER tablet 20-80 mEq  20-80 mEq Oral Daily PRN Bernestine Amass, MD        rifAXIMin Doreene Nest) tablet 200 mg  200 mg Oral BID [facility] [name], MD        thiamine (VITAMIN B1) injection 200 mg  200 mg Intravenous Q24H Vinodhini Arjunan, MD   200 mg at 12/28/21 1233    ursodioL (ACTIGALL) capsule 600 mg  600 mg Oral BID [facility] Vinodhini Arjunan, MD   600 mg at 12/29/21 0947    vitamin E (dl, acetate) capsule 800 Units  800 Units Oral Daily [facility] Vinodhini Arjunan, MD   800 Units at 12/29/21 0950       Vitals  Temp:  [36.5 C (97.7 F)-36.9 C (98.4 F)] 36.7 C (98.1 F)  Heart Rate:  [80-88] 84  *Resp:  [16-18] 18  BP: (95-105)/(62-65) 100/63  General: resting in bed  HEENT: NC/AT, scleral icterus present   GI: obese, soft, normoactive bowel sounds, generalized tenderness to palpation, non distended  Extremities: no edema  Neuro: A&O x 3, no asterixis    Data  CBC        12/29/21  0421 12/28/21  0432   WBC 7.9 6.1   HGB 8.3* 8.3*   HCT 24.6* 24.5*   PLT 76* 74*     Coags        12/29/21  0421 12/28/21  0432   INR 1.3* 1.3*     Chem7        12/29/21  0421 12/28/21  1600 12/28/21  0432   NA 140 140 140   K 3.3* 3.6 3.4*   CL 110 111* 110   CO2 24 24 24    BUN 3* 4* 4*   CREAT 0.34* 0.36* 0.33*   GLU 94 99 92     Electrolytes        12/29/21  0421 12/28/21  1600 12/28/21  0432   CA 7.6* 7.2* 7.2*   MG 1.9  --  2.2     Liver Panel        12/29/21  0421 12/28/21  0432   AST 81* 78*   ALT 62* 59   ALKP 513* 505*   TBILI 14.6* 15.2*   TP 3.6* 3.5*   ALB 1.2* 1.3*     TSH  No results found in last 7 days  HIV  No results found in last 7 days       CRP        12/13/21  0302   CRP 11.3*     UA  No results found in last 7 days    Microscopy:  No results found in last 7 days  @FEVSF ([id number]:5:0:0:-1)@  Microbiology results - 7 Days   Microbiology Results   Date Collected Specimen Source Result   12/23/2021  4:18 AM  Cytomegalovirus DNA, Quantitative PCR, Plasma Serum  Cmv Dna, Quant. Pcr 3107   Cmv Dna, Quant. Pcr(log Iu/ml) 3.49     12/25/2021  3:33 PM Clostridium Difficile Stool, Loose  Clostridium Difficile Toxin gene: Not detected   Clostridium Difficile Comment: Negative test       Radiology Results (last 24 hours)     ** No results found for the last 24 hours. **          IMAGING:    MRCP 12/08/21:   1. Cholelithiasis with mild gallbladder wall edema. Findings are disfavored to represent acute cholecystitis. Gallbladder wall thickening can be seen in the setting of underlying liver disease or cardiac disease.   2. No choledocholithiasis or biliaryobstruction.   3. Marked hepatic steatosis.   4. Moderate bilateral pleural effusions and adjacent atelectasis.    [place] 12/08/21:   Impressions:   1. No interval change without acute intracranial abnormalities.   2. Mild senescent changes are noted without obvious acute infarct. No hemorrhage, mass or midline shift. No findingsto suggest cerebral edema.    NM Hepatobiliary 12/09/21:   1. Normal radiotracer filling of the gallbladder is identified, therefore the exam is not consistent with cystic duct obstruction or acute cholecystitis.   2. Poor radiotracer uptake and diminished radiotracer excretion by the liver compatible with sequela of hepatocellular dysfunction.    RUQ U/S 12/03/21:   FINDINGS: Liver is mildly enlarged measuring 17.4 cm. There was diffuse increased echogenicity without focal lesions. Liver capsule was smooth. No dilated intra or extrahepatic ducts, common bile duct measured 4 mm. The gallbladder contained a large shadowing stone measuring 2 cm. There were 2 gallbladder polyps measuring 6 mm and 3 mm. No gallbladder wall thickening or tenderness. Right kidney measured 11.4 cm. Normal echogenicity and no hydronephrosis. Previously seen cyst on the lower pole the right kidney was not visualized.   IMPRESSION:   1. Liver is enlarged with diffuse  increased echogenicity. Finding is nonspecific but consistent with fatty infiltration.   2. Large 2 cm stone in the gallbladder, no sonographic evidence of acutecholecystitis.   3. Two gallbladder polyps measuring 6 mm and 3 mm.    TTE 12/03/21:   Conclusions:   The left ventricular cavity size is normal.   Normal LV wall thickness.   The left ventricular ejection fraction is in normal range at 68% by biplane MOD.   No LV regional wall motion abnormalities.   Normal diastolic function for age.       ENDOSCOPY:  No results found for this or any previous visit.    PATHOLOGY:   Liver biopsy 12/11/21 -   Liver, transjugular biopsy:  1.  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  2.  Ductular reaction with  focal pericholangitis; see comment.    COMMENT:  The biopsy shows severe steatosis with pericentral/sinusoidal and  periportal fibrosis, histiocytic and neutrophilic lobular inflammation,  Mallory hyaline as well as rare ballooned hepatocytes. 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  Clinical correlation with steatohepatitis risk factors is suggested,  including for any history of massive weight loss which may lead to an  aggressive onset of steatohepatitis [1].  The history of dexamethasone  use is noted; corticosteroid use has been associated with exacerbation  of pre-existing fatty liver disease [2].    Additionally, there is ductular reaction with focal pericholangitis,  raising consideration for a duct obstructive process. Clinical  correlation is advised. The concern for other drug-induced liver injury  is noted. In particular, thalidomide derivatives (such as pomalidomide)  have been reported in association with vanishing bile duct syndrome  (VBDS) [3]; however, there is no convincing duct loss to suggest VBDS.  There are no clusters of plasma cells seen to suggest involvement by  myeloma.    Given the reported high  plasma CMV levels, immunohistochemistry for CMV  will be performed and reported via addendum.      ASSESSMENT AND RECOMMENDATIONS  Ariel Braun is a 61 y.o. 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 Hepatology was consulted for work-up of elevated liver tests.      #Acute Cholestatic Liver Injury    She started daratumumab, pomylast, and dexamethasone weekly in 2021. 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 Her ALKP and bilirubin rose to 278 and 1.5, respectively on 10/13/21. During her hospitalization at [facility] for hypovolemic shock in the setting of diarrhea from norovirus, her liver tests showed an elevated ALKP in the 1000s (GGT was in the 1000s at the time as well), AST in the 140s-160s, and ALT in the 120s-180s. Her bilirubin had also steadily rose to the 10s and peaked at 16.8. Today, liver enzymes are improving her ALT 62, AST 81, ALP 513, and total bilirubin 14.6.    Transjugular liver biopsy was done on 12/11/21 with elevated hepatic venous pressure gradient of 8. Biopsy showed steatohepatitis with pericentral/sinusoidal and periportal fibrosis (stage 2, scale 0-4, NASH-CRN method) and ductular reaction with focal pericholangitis, which could be concerning for DILI. Biopsy did not show myleoma or duct loss to suggest vanishing bile duct syndrome. CMV stain was negative for CMV.     Overall, patient has an ongoing cholestatic liver injury (R-Factor: 0.3) of which we suspect is a multifactorial liver injury with background NASH  which will take time to recover from. Extensive workup has been done thus far including lab testing, imaging and liver biopsy. 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 Imaging was done with MRCP not showing biliary obstruction and ultrasound not showing normal CBD. Liver biopsy showed steatohepatitis/focal pericholangitis and CMV stain was negative. Suspect patient has underlying NASH which  explains the steatohepatitis and likely has multifactorial acute cholestatic liver injury from hypotension/hypoperfusion (given patients liver enzymes increased during shock and patient continues to have intermittent hypotension) and possible component of DILI (received multiple antibiotics received such as zosyn or CTX which both have a likelihood score of B per [employer]). Less likely due vanishing bile duct syndrome (as pathology did not show bile duct loss), PBC (negative antimitochondrial antibody), PSC (MRCP not showing any strictures), infiltrative malignancy (biopsy not suggestive of myeloma) or choledocholithiasis (MRCP negative for choledocholithiasis). 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    Recommendations:   - Daily LFTs and INR   - Avoid hypotension  - Continue Ursodiol to 600mg  twice daily  - Recommend starting vitamin E 800mg  daily for NASH    Hepatology to sign off at this time, please call us back for any questions or concerns    Thank you for involving [place] in [place] care, we will continue to follow with you.  Please don't hesitate to page [phone] with questions.  This patient  was discussed with attending, Dr. Murvin Natal on 12/29/21.    Chelsea Aus, MD  Gastroenterology Fellow

## 2021-12-29 NOTE — Progress Notes (Signed)
[facility] PROGRESS NOTE  ATTENDING ONLY     My date of service is 12/29/21.    24 Hour Course  12:55 AM: Potassium 3.6, repleted 40 meq IV KCL.      Subjective  Doing ok without specific complaints. Denies pain, nausea, or vomiting.     Vitals  Temp:  [36.5 C (97.7 F)-36.9 C (98.4 F)] 36.7 C (98.1 F)  Heart Rate:  [78-88] 84  *Resp:  [16-18] 18  BP: (95-105)/(62-68) 100/63  SpO2:  [95 %-98 %] 98 %    MostRecent Weight: 85.6 kg (188 lb 11.4 oz)  Admission Weight: 79.6 kg (175 lb 7.8 oz)      Intake/Output Summary (Last 24 hours) at 12/29/2021 0954  Last data filed at 12/29/2021 0615  Gross per 24 hour   Intake 2976 ml   Output 3075 ml   Net -99 ml       Pain Score: 0    Physical Exam  Appearance: NAD. In chair with belt.     Comments: Alert and oriented X 3 slow to respond  HENT:      Head: Normocephalic and atraumatic.      Nose: Nose normal.   Neck:  R IJ CVC with dressing in place, c/d/i  Eyes:      General: Scleral icterus present.      Extraocular Movements: Extraocular movements intact.      Conjunctiva/sclera: Conjunctivae normal.      Pupils: Pupils are equal, round, and reactive to light.   Cardiovascular:      Rate and Rhythm: Normal rate and regular rhythm.      Pulses: Normal pulses.      Heart sounds: Normal heart sounds. No murmur heard.    No friction rub. No gallop.   Pulmonary:      Effort: Pulmonary effort is normal. No respiratory distress.      Breath sounds: Normal breath sounds. No stridor. No wheezing, rhonchi or rales.   Abdominal:      General: Abdomen is flat. Bowel sounds are normal. There is no distension.      Palpations: Abdomen is soft. There is no mass.      Tenderness: There is no abdominal tenderness. There is no guarding or rebound.      Hernia: No hernia is present.      Comments: Foley catheter in place, urine appears dark  No suprapubic tenderness.   Musculoskeletal:         General: Normal range of motion.      Cervical back: Normal range of motion and  neck supple.      Right lower leg: Edema present.      Left lower leg: Edema present.      Comments: Strength 1/5 in the bilateral lower extremities, unable to raise them to gravity.   Pitting edema 3-4+ in the bilateral lower extremities.   Anasarca in the abdomen and thighs bilaterally.    No asterixis  Skin:     General: Skin is warm.      Capillary Refill: Capillary refill takes less than 2 seconds.      Coloration: Skin is jaundiced.      Findings: Bruising present.      Comments: ecchymoses on the sternum   Neurological:      Mental Status: She is alert. She is oriented to self, place, time, and the current president.     Motor: Weakness present.       Scheduled Meds:  0.9% sodium chloride flush  10-20 mL Intravenous Q12H [place]    0.9% sodium chloride flush  3 mL Intravenous Q12H SCH    dicyclomine  20 mg Oral 4x Daily [place]    dry mouth oral rinse  5 mL Mucous Membrane 4x Daily [place]    ergocalciferol (vitamin D2)  50,000 Units Oral Q7 Days [place]    ganciclovir (CYTOVENE) IVPB  5 mg/kg (Dosing Weight) Intravenous Q12H [place]    multivitamin complete chewable  1 tablet Oral Daily [place]    nystatin  1 Application Topical BID [place]    thiamine  200 mg Intravenous Q24H    ursodioL  600 mg Oral BID [place]    vitamin E (dl, acetate)  800 Units Oral Daily [place]     Continuous Infusions:   dextrose 5 % and 0.9 % sodium chloride 75 mL/hr (12/29/21 0001)     PRN Meds:   0.9% sodium chloride flush  10-20 mL Intravenous PRN    0.9% sodium chloride flush  3 mL Intravenous PRN    albuterol  2.5 mg Nebulization Q4H PRN    Or    albuterol  2.5 mg Nebulization Q4H PRN    dextrose 50%  12.5 g Intravenous Q15 Min PRN    Or    glucose  20 g Oral Q15 Min PRN    Or    juice (for hypoglycemia)  16 g Oral Q15 Min PRN    diphenhydrAMINE  50 mg Intravenous Once PRN    EPINEPHrine  0.3 mg Intramuscular Once PRN    hydrocortisone  100 mg Intravenous Once PRN    HYDROmorphone  0.3-0.8 mg Intravenous Q4H PRN    LORazepam  0.5 mg Oral  Once PRN    magnesium sulfate in dextrose 5 %  2-4 g Intravenous Daily PRN    Or    magnesium sulfate in water  2-4 g Intravenous Daily PRN    metoclopramide  5-10 mg Intravenous Q8H PRN    ondansetron  8 mg Intravenous Q8H PRN    polyethylene glycol  17 g Oral Daily PRN    potassium chloride in sterile water  20-80 mEq Intravenous Daily PRN    Or    potassium chloride in sterile water  20-80 mEq Intravenous Daily PRN    Or    potassium chloride  20-80 mEq Oral Daily PRN       Data    CBC        12/29/21  0421 12/28/21  0432   WBC 7.9 6.1   HGB 8.3* 8.3*   HCT 24.6* 24.5*   PLT 76* 74*     Coags        12/29/21  0421 12/28/21  0432   INR 1.3* 1.3*     Chem7        12/29/21  0421 12/28/21  1600 12/28/21  0432   NA 140 140 140   K 3.3* 3.6 3.4*   CL 110 111* 110   CO2 24 24 24    BUN 3* 4* 4*   CREAT 0.34* 0.36* 0.33*   GLU 94 99 92     Electrolytes        12/29/21  0421 12/28/21  1600 12/28/21  0432   CA 7.6* 7.2* 7.2*   MG 1.9  --  2.2     Liver Panel        12/29/21  0421 12/28/21  0432   AST 81* 78*   ALT  62* 59   ALKP 513* 505*   TBILI 14.6* 15.2*   TP 3.6* 3.5*   ALB 1.2* 1.3*     Lactate  No results found in last 36 hours    Microbiology Results (last 24 hours)     Procedure Component Value Units Date/Time    COVID-19 RNA, RT-PCR/Nucleic Acid Amplification RETEST (Asymptomatic and No Specific COVID-19 Suspicion); No; No; No; No Procedure Planned; Unknown [[id number]] Collected: 12/29/21 0510    Order Status: Sent Specimen: Not Applicable from Bilateral Anterior Nares +/- OP Swab Updated: 12/29/21 5462          Radiology Results  No results found.     I discussed the patient with Kari Baars, MD from [facility] regarding A&P.    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 The patient has received VRd X 4 (achieving VGPR) then Mel 200 ASCT 05/12/11. 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 She met the criteria  for PD on 02/19/2020 and was started on third line DARA PD in June 2021, which was recently held on 10/20/21 due to chronic diarrhea. Patient was transferred OSH after recent hypovolemic/distributive shock. Here for acute encephalopathy workup and acute liver failure workup.      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   June 2020 due to progression of M spike Revlimid was increased to 15 mg PO D1-21 every 28 days, plus dexamethasone  July 2021: started daratumumab, Pomylast 2 mg D 1-21, Q28 days, dexamethasone 12 mg PO Q weekly.   Primary oncologist: Dr. Iona Coach,  Dr. Kennon Rounds [place] [facility]  Receiving dexamethasone 12 mg weekly for diarrhea  Receiving MT with daratumumab with pomalyst and dex, most recent dose 10/16/21, has since been on hold due to worsening diarrhea.     DATA:   09/07/2010: IgG Kappa MM  12/02/2021: Ig M <5, IgG 447, IgA 20, Ig E <2, SFLC Kappa 16.6, Lambda 1.6, Kappa/Lamda 10.38  12/14/21: IgG K on IFE, IgG 430, KLC 16.6, LLC 2.7, KLR 6.15    Chemo:   Holding Daratumumab and Pomalyst, last dose 10/20/21, given recent infection and acute liver failure.    Supportive Care:   Transfusion: Keep Hg >7, and Platelets >10.   Per daughter, has autoantibodies in blood was a difficult match. Would like to be contacted prior to transfusion.   Line: PICC placed 1/26  Outpatient Oncologist: Dr. Kennon Rounds    Dispo: TBD, after resolution of sepsis and liver failure    # Immunocompromised  # CMV colitis  # At risk for infections due to malignancy  # Shock, likely hypovolemic vs distributive in the setting of chronic diarrhea, + norovirus infection   # Acute on chronic diarrhea, norovirus positive., diff and OP negative.   -No prior evidence of adrenal insufficiency, AM cortisol levels normal, previously on  dexamethasone 8 mg PO Qweekly, but has been held since early December.    Data:  - CT brain: No acute intracranial hemorrhage, herniation, or hydrocephalus.  - CT chest/A/P pending: Medium-sized bilateral pleural effusions with bilateral peribronchovascular groundglass opacities, suggestive of pulmonary edema. Clustered nodules in right middle lobe along the right minor fissure, which  could represent mild aspiration or infection. Numerous sclerotic and mixed lytic/sclerotic lesions throughout the thoracic spine and bilateral ribs, increased in size and number compared to prior. Appearance is atypical for multiple myeloma but may represent a combination of treated, partially treated, and active disease. No acute pathologic fracture. Findings compatible with enterocolitis of infectious or inflammatory etiology. Involvement of the colon is greater than of the small bowel. Marked hepatic steatosis. Increased sclerosis of bone lesions throughout the thoracolumbar spine, bony pelvis, and proximal left femur. Appearance is atypical for multiple myeloma but may represent a combination of treated, partially treated, and active disease. No acute pathologic fracture.  - 1/29:CMV PCR [id number],423NTIR  - 1/31: GI PCR panel positive only for Norovirus.   2/1: LP results: Neutrophils 6% in tube 1, Lymphocytes 88% (predominant), protein 42 (normal), glucose 36 (low), rapid HSV in CSF negative. CMV CSF PCR negative    - 2/2 CMV PCR 179,555High   - 2/2 Flex Sig Biopsies prelim: "colonic mucosa with a rare small non-necrotizing mucosal granuloma and rare CMV immunolabeling"   - 2/5 [place] work up: sIL2 (1556.6 high), ferritin (948 high), TG(258 high), NKA-IFN? 10 (WNL)  - 2/8: CMV PCR: 3107 (sig improvement)  - 2/10: C diff: negative.  - 2/12: Repeat GI viral PCR: Pending.    Plan:  - appreciate ID reccs   - Ganciclovir 5 mg/kg (1/31-). S/p one dose of IVIG on 2/7. Can switch to oral antiviral once the diarrhea improves.              - If  again Norovirus positive, can consider another IVIG and consider touching base with GI.   - GI consult: appreciate reccs: flex sig 2/2    - stop zosyn: s/p IV Zosyn 4.5 g Q8hrs (1/26-1/28, restarted 1/29 given recurrent fever-12/16/21)  - checking weekly CMV, next 2/15 [ ]    - OI ppx: Acyclovir IV    # Grade III neutropenia, at baseline  # Leukopenia  #Thrombocytopenia (possibly 2/2 liver disease)  Data: WBC of 2.2 on 12/05/21, on Zarxio at home  Plan: Continue to monitor    # Acute liver injury of unclear etiology  # Acute on chronic encephalopathy likely 2/2 infectious vs hepatic vs metabolic  # Hyperbilirubinemia  # Anasarca  # Hypoalbuminemia    Data:   -Steady rise in LFTs in 05/2021,  -12/02/21: AST 152--> 121, ALT 130--> 110, TB 5.4, Albumin 1.1  -Hepatitis panel negative.   -1/26:  Tbili 14.6, GGT 1160, ammonia 25  -CTAP 12/02/21: Scattered sclerotic lesions in the lumbar spine, hepatic steatosis.   -RUQ ultrasound: liver enlarged with diffuse increased echogenicity, consistent with fatty infiltration. 2cm stone in the gallbladder.   -HIDA scan: Normal radiotracer filling of the gallbladder is identified, therefore the exam is not consistent with cystic duct obstruction or acute cholecystitis.  - [place] abd 1/27: Hepatomegaly with diffusely increased echogenicity which can be seen with hepatic amyloidosis or steatosis. Please refer to the recently performed biopsy for further details. Mild perihepatic ascites. Patent hepatic vasculature with normal waveforms. Cholelithiasis without acute cholecystitis. Right pleural effusion.  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 CMV  stains: Negative.  - 1/30: Serum studies:  Ferritin 2380, iron serum 60, transferrin 46 (low), 93 % sat (high), HEV IgM negative, PCR forVZV negative, HSV negative,CMV+ 443,961High,EBV negative, AMA negative, Hepatitis B core Ab, surface Ab negative, Hep A IgG +/IgM negative  - 2/3 IgG subclass 4: 2 (total 444, subclasses 1/2/3 = 308/25/11 respectively)    Plan:   - Hepatology recs: 2/7: Path looks unlikely for vanishing bile duct syndrome. Could be drug related. Increase Ursodiol to 600 mg BID, Check Hepatitis E. 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 Unlikely diarrhea from Ursodiol.                         - Consider avoiding steroid    - Daily LFT/INR  - RD consult placed. Protein shakes to use. Family requesting appetite stimulant. Potential harm than benefit. Pt starting to eat better now.    # Acute toxic encephalopathy, improved  In the setting of liver failure, ammonia only 25. LP performed 2/1 to r/o encephalitis.     Data:   1/31: HIV, TSH, Folate, B12 WNL  1/30: CT head WNL other than sinus disease  2/1: LP results: Neutrophils 6% in tube 1, Lymphocytes 88% (predominant), protein 42 (normal), glucose 36 (low), rapid HSV in CSF negative. CMV CSF PCR negative  2/5: Brain MRI: Smooth diffuse dural thickening and enhancement as well as bilateral holohemispheric subdural effusions, which may be on the basis of diminished intracranial pressure following recent lumbar puncture.  -2/8: EEG: non-specific mild cerebral dysfunction.  -2/9: CT Brain: similar to prior MRI with hyperdense areas likely hygromas    Plan:  - Neurology consult: 2/7: Encephalopathy likely from underlying medical disease. Delirium prevention. Avoid Ativan and try Seroquel for delirium. MRI findings is from LP. Okay to try caffiene tablets in the daytime to help with postural headache but patient is allergic to it.  -2/8: Repeat CT head to monitor the status of subdural effusion: stable, EEG: diffuse slowing, Thiamine iv  prophylaxis.  -Speech path: Regular and thin liquid. Slightly higher risk for aspiration given encephalopathy.  - Start empiric rifaximin given possibility of HE without elevated ammonia  -- holding off on lactulose given diarrhea    # Hypokalemia:  # Hypomagnesemia:  Replete as needed.    # Vitamin D deficiency:  -Supplements ordered.    # Moderate malnutrition:  -Seen by RD and given recs for supplements.    # History of PE, diagnosed in April 2019  - hold home [place] for now given recent anemia and concerns for bleeding.   - monitor for signs and symptoms of worsening bleeding.     # Elevated TSH at OSH, likely 2/2 subclinical hypothyroidism  -TSH WNL on 12/15/20.      # Anxiety, emotional distress:   -Patient has stressful social situation, intellectually disabled son, and regarding underlying disease process.     #Chronic buttocks pressure wound  - Wound care consult, appreciate recs  - Position changes as tolerated, waffle cushion, etc  -- Pressure wound ruled out, treating bilateral buttocks moisture and friction damage     #VTE PPx:  Contraindicated: Other: anticipate Plt <50    Severity of Illness  Stable     During this hospitalization the patient is also being treated for:  - Sepsis (present on admission)    - Malignancy associated fatigue on admission  - Neoplasm/malignancy associated pain  - Hypoalbuminemia (present on admission)  - Fluid status: Hypovolemia on admission     - Cachexia  -  Encephalopathy: Metabolic encephalopathy     Code Status: FULL    [name], MD  12/29/21

## 2021-12-29 NOTE — Plan of Care (Signed)
Pt AVSS thus far into shift w/exception of moderate pain to sacral area relieved by repositioning q2 hr and PRN Dilaudid. Triad lotion applied to site per orders. Educated pt regarding POC. Pt verbalized understanding. Will CTM.    Problem: Discharge Planning - Adult  Goal: Knowledge of and participation in plan of care  Outcome: Progress within 12 hours     Problem: Fall, at Risk or Actual - Adult  Goal: Absence of falls and fall related injury  Outcome: Progress within 12 hours  Goal: Knowledge of fall prevention  Outcome: Progress within 12 hours     Problem: Delirium - Adult / Pediatric  Goal: Absence or resolution of delirium  Outcome: Progress within 12 hours     Problem: Pain,  Acute / Chronic- Adult  Goal: Control of chronic pain  Outcome: Progress within 12 hours  Goal: Control of acute pain  Outcome: Progress within 12 hours

## 2021-12-29 NOTE — Consults (Signed)
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 The patient has received VRd X 4 (achieving VGPR) then Mel 200 ASCT 05/12/11. 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 She met the criteria for PD on 02/19/2020 and was started on third line DARA PD in June 2021, which was recently held on 10/20/21 due to chronic diarrhea. Patient was transferred OSH after recent hypovolemic/distributive shock. Here for acute encephalopathy workup and acute liver failure workup.    Assessment and Treatment Plan    OT Progress summary: Pt continues to p/w decreased motivation and engagement in OOB activity despite increased education. Pt tolerated hoyer transfer to chair well with no pain. Pt more alert, reported increased appetite once up in chair and tolerate over an hour and half. Pt deferred hoyer transfer back to bed. Pt limited by global weakness and fatigue requiring dependent assist with ADLs. Pt would demonstrate increased progress with increased participation. Recommend placement.  Progress with OT: Slow progress  Current maximal level of assist: Dependent assist  Next session focus:      Recommendations    Recommended Discharge Disposition Placement for continued therapy   Discharge DME Recommendations     Equipment Recommendations     DME Comment     Discharge Recommendations Comment     Rehab Potential Patient participates well in therapy and is progressing towards goal   Anticipated Assistance Available at Discharge Family;Spouse/Significant other   Anticipated Type of Assistance Available at Discharge     Anticipated Time of Assistance Available at Discharge Unknown at this time   Barriers to Discharge Medical issues;Impaired Cognition;Insufficient activity tolerance   Patient's current functional ability appropriate for D/C recommendations       Inpatient Recommendations  OT Inpatient  Recommendations     Recommendation Comments Sling to chair and 2p assist   Current Maximal Level of Assist Needed Dependent assist     Brace/Precautions   Brace/Orthotic/Prosthetic:   Precautions:Has weight bearing limitation or precaution: Yes  Precautions and weight bearing status comments: delrium, falls     Subjective Report:"My doctor doesn't want me out of bed, I can rest my hips"    Patient/Family Goal:     Objective Findings and Interventions    Areas of Occupation    Grooming and Light Hygiene     Cueing Required     Comment     Self Feeding     Cueing Required     Comment     Toileting Minimal assistance   Cueing Required     Toileting Clothing Management Maximal assistance   Cueing Required     Comment min A rolling, max A pericare   Upper Body Dressing     Cueing Required     Comment     Lower Body Dressing     Cueing Required     Comment     Upper Body Bathing     Cueing Required     Comment     Lower Body Bathing     Cueing Required     Comment     ADL/IADL Comment         Functional Transfers    Bed Mobility From Supine   Bed Mobility To Sidelying   Level of Assist Minimal assistance   Cueing Required     Transfer From Bed   Transfer  To Chair   Level of Assist Dependent   Cueing Required     Technique     Functional Transfer Comment Pt min A rolling and able to report need to use restroom. Min A rolling using bed pan. Dependent 2p transfer to chair. Pt tolerated 1+ hour in chair.         DISH for ADLs  6 Click Score: 11    Client Factors                                                 OT Education       OT Plan of [place], [place]  12/29/2021 2:04 PM

## 2021-12-29 NOTE — Consults (Signed)
DOS: 12/29/21     NUTRITION SERVICES ASSESSMENT    Patient History:    Ariel Braun is a 61 y.o. 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 The patient has received VRd X 4 (achieving VGPR) then Mel 200 ASCT 05/12/11. 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 She met the criteria for PD on 02/19/2020 and was started on third line DARA PD in June 2021, which was recently held on 10/20/21 due to chronic diarrhea. Patient was transferred OSH after recent hypovolemic/distributive shock. Here for acute encephalopathy workup and acute liver failure workup.    Pertinent interval history: Nutrition following given poor PO intake and significant wt loss PTA.     Comparative Standards:    Energy Needs: MSJ: 7078.13- 1800.75 (based on 1286.25: x 1.3 - 1.4)   Protein Needs: 73.78 g - 79.45 g (1.3 - 1.4 g/kg based on 56.75 kg)   Fluid needs: [place]: 2051.25 mL/d     Nutrition Intervention:  Oral Nutrition:   Inpatient Orders: Current diet order appropriate     Nutrition recommendations to patient:   -->Recommend feeding assistance  -->Provided menu selection assistance  -->Provided menu addendum (puree menu)     Oral Nutrition Supplements and/or Food Fortification  --> Boost Plus Vanilla, 1 daily  --> Smoothies/Shakes per pt preference      Vitamins/Minerals  --> Check 25-(OH) Vitamin D with next lab draw to assess for deficiency.  --> Flintstones Chewable Multivitamin (1 tab, daily)  --> Recommend phos and mag lab to check serum levels of lytes given potassium low     Nutrition Related Medication Recommendations:   --> Recommend appetite stimulant if medically feasible; daughter asked about stating one    Traci Sermon, MPH, RD  [place]: [phone]

## 2021-12-30 LAB — COMPLETE BLOOD COUNT WITH DIFFERENTIAL
Abs Basophils: 0 10*9/L (ref 0.00–0.10)
Abs Eosinophils: 0 10*9/L (ref 0.00–0.40)
Abs Imm Granulocytes: 0.09 10*9/L (ref <0.10)
Abs Lymphocytes: 0.28 10*9/L — ABNORMAL LOW (ref 1.00–3.40)
Abs Monocytes: 0.16 10*9/L — ABNORMAL LOW (ref 0.20–0.80)
Abs Neutrophils: 6.43 10*9/L (ref 1.80–6.80)
Hematocrit: 22.7 % — ABNORMAL LOW (ref 36.0–46.0)
Hemoglobin: 7.8 g/dL — ABNORMAL LOW (ref 12.0–15.5)
MCH: 36.1 pg — ABNORMAL HIGH (ref 26.0–34.0)
MCHC: 34.4 g/dL (ref 31.0–36.0)
MCV: 105 fL — ABNORMAL HIGH (ref 80–100)
MPV: 13 fL — ABNORMAL HIGH (ref 9.1–12.6)
Platelet Count: 74 10*9/L — ABNORMAL LOW (ref 140–450)
RBC Count: 2.16 10*12/L — ABNORMAL LOW (ref 4.00–5.20)
RDW-CV: 24.7 % — ABNORMAL HIGH (ref 11.7–14.4)
WBC Count: 7 10*9/L (ref 3.4–10.0)

## 2021-12-30 LAB — COMPREHENSIVE METABOLIC PANEL (BMP, AST, ALT, T.BILI, ALKP, TP ALB)
AST: 88 U/L — ABNORMAL HIGH (ref 5–44)
Alanine transaminase: 66 U/L — ABNORMAL HIGH (ref 10–61)
Albumin, Serum / Plasma: 1.2 g/dL — ABNORMAL LOW (ref 3.4–4.8)
Alkaline Phosphatase: 504 U/L — ABNORMAL HIGH (ref 38–108)
Anion Gap: 5 (ref 4–14)
Bilirubin, Total: 14.5 mg/dL — ABNORMAL HIGH (ref 0.2–1.2)
Calcium, total, Serum / Plasma: 7.5 mg/dL — ABNORMAL LOW (ref 8.4–10.5)
Carbon Dioxide, Total: 25 mmol/L (ref 22–29)
Chloride, Serum / Plasma: 111 mmol/L — ABNORMAL HIGH (ref 101–110)
Creatinine: 0.4 mg/dL — ABNORMAL LOW (ref 0.55–1.02)
Glucose, non-fasting: 91 mg/dL (ref 70–199)
Potassium, Serum / Plasma: 2.7 mmol/L — CL (ref 3.5–5.0)
Protein, Total, Serum / Plasma: 3.5 g/dL — ABNORMAL LOW (ref 6.3–8.6)
Sodium, Serum / Plasma: 141 mmol/L (ref 135–145)
Urea Nitrogen, Serum / Plasma: 4 mg/dL — ABNORMAL LOW (ref 7–25)
eGFRcr: 113 mL/min/{1.73_m2} (ref >59)

## 2021-12-30 LAB — PROTHROMBIN TIME
INR: 1.3 — ABNORMAL HIGH (ref 0.9–1.2)
PT: 15.9 s — ABNORMAL HIGH (ref 11.6–15.0)

## 2021-12-30 LAB — TYPE AND SCREEN
ABO/RH(D): O POS
Antibody Screen: NEGATIVE

## 2021-12-30 LAB — CYTOMEGALOVIRUS DNA, QUANTITATIVE PCR, PLASMA
CMV DNA (IU/mL): 755 IU/mL — ABNORMAL HIGH (ref <30)
CMV DNA (Log IU/mL): 2.88 — ABNORMAL HIGH (ref <1.48)

## 2021-12-30 LAB — GI VIRAL PANEL PCR
Adenovirus 40/41: NOT DETECTED
Comments: NOT DETECTED
Norovirus GI/GII: DETECTED — AB
Rotavirus A: NOT DETECTED

## 2021-12-30 LAB — FIBRINOGEN, FUNCTIONAL: Fibrinogen, Functional: 161 mg/dL — ABNORMAL LOW (ref 202–430)

## 2021-12-30 LAB — BILIRUBIN, DIRECT: Bilirubin, Direct: 10.4 mg/dL — ABNORMAL HIGH (ref <0.6)

## 2021-12-30 LAB — POCT GLUCOSE: Glucose, Glucometer: 99 mg/dL (ref 70–199)

## 2021-12-30 LAB — BASIC METABOLIC PANEL (NA, K, CL, CO2, BUN, CR, GLU, CA)
Anion Gap: 8 (ref 4–14)
Calcium, total, Serum / Plasma: 7.6 mg/dL — ABNORMAL LOW (ref 8.4–10.5)
Carbon Dioxide, Total: 20 mmol/L — ABNORMAL LOW (ref 22–29)
Chloride, Serum / Plasma: 113 mmol/L — ABNORMAL HIGH (ref 101–110)
Creatinine: 0.45 mg/dL — ABNORMAL LOW (ref 0.55–1.02)
Glucose, non-fasting: 121 mg/dL (ref 70–199)
Potassium, Serum / Plasma: 3.7 mmol/L (ref 3.5–5.0)
Sodium, Serum / Plasma: 141 mmol/L (ref 135–145)
Urea Nitrogen, Serum / Plasma: 5 mg/dL — ABNORMAL LOW (ref 7–25)
eGFRcr: 109 mL/min/{1.73_m2} (ref >59)

## 2021-12-30 LAB — MAGNESIUM, SERUM / PLASMA: Magnesium, Serum / Plasma: 1.9 mg/dL (ref 1.6–2.6)

## 2021-12-30 LAB — HEPATITIS E ANTIBODY, IGM: Hep E Ab IgM: NOT DETECTED

## 2021-12-30 MED ORDER — POTASSIUM CHLORIDE 20 MEQ/50 ML IN STERILE WATER INTRAVENOUS PIGGYBACK
20 | Freq: Once | INTRAVENOUS | Status: AC
Start: 2021-12-30 — End: 2021-12-30
  Administered 2021-12-30: 21:00:00 20 meq via INTRAVENOUS

## 2021-12-30 MED ORDER — SODIUM CHLORIDE 0.9 % IV BOLUS
0.9 | Freq: Once | INTRAVENOUS | Status: AC
Start: 2021-12-30 — End: 2021-12-30
  Administered 2021-12-30: 14:00:00 500 mL via INTRAVENOUS

## 2021-12-30 MED ORDER — CLOTRIMAZOLE 1 % TOPICAL CREAM
1 % | Freq: Two times a day (BID) | TOPICAL | Status: DC
Start: 2021-12-30 — End: 2022-01-15
  Administered 2021-12-31 – 2022-01-15 (×32): via TOPICAL

## 2021-12-30 MED ORDER — SODIUM CHLORIDE 0.9 % IV BOLUS
0.9 | Freq: Once | INTRAVENOUS | Status: AC
Start: 2021-12-30 — End: 2021-12-30
  Administered 2021-12-30: 13:00:00 500 mL via INTRAVENOUS

## 2021-12-30 MED ORDER — POTASSIUM CHLORIDE 40 MEQ/100ML IN STERILE WATER INTRAVENOUS PIGGYBACK
40 | Freq: Once | INTRAVENOUS | Status: AC
Start: 2021-12-30 — End: 2021-12-30
  Administered 2021-12-30: 14:00:00 40 meq via INTRAVENOUS

## 2021-12-30 MED ORDER — POTASSIUM CHLORIDE 40 MEQ/100ML IN STERILE WATER INTRAVENOUS PIGGYBACK
40 | Freq: Once | INTRAVENOUS | Status: AC
Start: 2021-12-30 — End: 2021-12-30
  Administered 2021-12-30: 17:00:00 40 meq via INTRAVENOUS

## 2021-12-30 MED ORDER — POTASSIUM CHLORIDE 40 MEQ/100ML IN STERILE WATER INTRAVENOUS PIGGYBACK
40 | Freq: Once | INTRAVENOUS | Status: AC
Start: 2021-12-30 — End: 2021-12-30
  Administered 2021-12-31: 02:00:00 40 meq via INTRAVENOUS

## 2021-12-30 MED FILL — XIFAXAN 200 MG TABLET: 200 mg | ORAL | Qty: 6

## 2021-12-30 MED FILL — POTASSIUM CHLORIDE 20 MEQ/50 ML IN STERILE WATER INTRAVENOUS PIGGYBACK: 20 mEq/50 mL | INTRAVENOUS | Qty: 50

## 2021-12-30 MED FILL — URSODIOL 300 MG CAPSULE: 300 mg | ORAL | Qty: 2

## 2021-12-30 MED FILL — GANCICLOVIR SODIUM 500 MG INTRAVENOUS SOLUTION: 500 mg | INTRAVENOUS | Qty: 8

## 2021-12-30 MED FILL — DICYCLOMINE 10 MG CAPSULE: 10 mg | ORAL | Qty: 2

## 2021-12-30 MED FILL — FLINTSTONES COMPLETE (IRON) CHEWABLE TABLET: ORAL | Qty: 1

## 2021-12-30 MED FILL — POTASSIUM CHLORIDE 40 MEQ/100ML IN STERILE WATER INTRAVENOUS PIGGYBACK: 40 mEq/100 mL | INTRAVENOUS | Qty: 100

## 2021-12-30 MED FILL — VITAMIN E (DL, ACETATE) 45 MG (100 UNIT) CAPSULE: 45 mg (100 unit) | ORAL | Qty: 8

## 2021-12-30 MED FILL — ERGOCALCIFEROL (VITAMIN D2) 1,250 MCG (50,000 UNIT) CAPSULE: 50000 unit | ORAL | Qty: 1

## 2021-12-30 MED FILL — MAGNESIUM SULFATE 2 GRAM/50 ML (4 %) IN WATER INTRAVENOUS PIGGYBACK: 2 gram/50 mL (4 %) | INTRAVENOUS | Qty: 50

## 2021-12-30 MED FILL — THIAMINE HCL (VITAMIN B1) 100 MG/ML INJECTION SOLUTION: 100 mg/mL | INTRAMUSCULAR | Qty: 2

## 2021-12-30 NOTE — Plan of Care (Addendum)
Pt had five watery loose clay colored incontinent stools thus far into shift. MD made aware. Pt c/o discomfort w/cleaning d/t irritation of skin r/t incontinence, however refusing pain meds. Triad cream applied throughout shift per orders. BP: 85/53 @ 0400 V/S. 92/61, pt c/o dizziness upon reassessment. Additional ordered 500 ml NS bolus being given. Will continue repositioning q2hrs and CTM.    Problem: Discharge Planning - Adult  Goal: Knowledge of and participation in plan of care  Outcome: Progress within 12 hours     Problem: Fall, at Risk or Actual - Adult  Goal: Absence of falls and fall related injury  Outcome: Progress within 12 hours  Goal: Knowledge of fall prevention  Outcome: Progress within 12 hours     Problem: Delirium - Adult / Pediatric  Goal: Absence or resolution of delirium  Outcome: Progress within 12 hours     Problem: Pain,  Acute / Chronic- Adult  Goal: Control of chronic pain  Outcome: Progress within 12 hours  Goal: Control of acute pain  Outcome: Progress within 12 hours

## 2021-12-30 NOTE — Interdisciplinary (Signed)
Advance Care Planning     Spiritual Care Services    Patient's Meaning-Making/Spirituality/Religion: Catholic    Reason for Visit: Follow-up visit    ______________________________________________________________________    Summary of Visit    Chaplain visited with patient at bedside. Introduced self and Investment banker, operational.    Patient was lying in bed with the sheets pulled up to her neck. She informed me that she was tired but that she could give me a few minutes. So, I reminded her that the last time that I came to see her she stated that she feels more [place] than [place]. To which she responded that I was right. I then told her that we have priests that make daily visits if she would like communion or the sacrament of the sick.    The patient confused me by asking if a person could receive the sacrament of the sick if they had not received confirmation. I spoke of my personal experience in order to normalize the receipt of the sacrament. She then stated that she had received confirmation and was married in the [employer]. I had hoped to unpack her question but we moved on.    The patient said that she felt that she could speak with God herself without a priest and that she opens the Bible and receives answers. Looking around the room, I saw no Bible and asked if I could bring her one. She agreed and we were interrupted by a nurse who said that she needed to be taken off the unit for procedure, so the patient and I will have to continue our conversation later.    ______________________________________________________________________    Spiritual Assessment    Based upon our two interrupted interactions, I believe that the patient feels a direct connection to God and engages with him through the Bible. However, this is speculation on my part and I hope to meet with her to discuss this further as well as her concepts of what happens when you die and her desire to live.     While she was off unit and  in the procedure, I dropped off a Bible with a picture of the [employer] and left it on her bed tray.    ______________________________________________________________________    [place] will attempt follow-up visit within one week if possible for patient's continuing spiritual care. Chaplain will incorporate spiritual and/or religious needs in plan of care. Chaplain will incorporate cultural needs in plan of care. Chaplain will encourage patient and/or family to verbalize feelings. Chaplain will assist patient in exploring spirituality as a resource for coping.    ______________________________________________________________________    Time Spent with Patient: 20 min    Spiritual Care Provider: Jarvis Morgan, PhD, MPA    ______________________________________________________________________      Spiritual care is available 24/7. To reach the chaplain on call at any time:  [place] or call [place] or call [place] or call [phone]

## 2021-12-30 NOTE — Interdisciplinary (Signed)
SOCIAL WORK NOTE    Data:     Pt is 61 y.o., transferred from OSH after recent hypovolemic/distributive shock. Here for acute encephalopathy workup and acute liver failure workup, per H&P. Please see inpatient chart for full medical history and details.  SW self referred to Pt and family for supportive services.     SW introduced self and role to Pt. SW explored Pt's coping. She reported events of the morning and feeling tired. SW inquired about how Pt is coping. Pt indicated doing well. She shared love for her children who have been visiting regularly.     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 She shared about her hardships and lack of support from extended family. She identified her children as her primary support at this time. SW explored her motivation for ongoing treatment. She shared strong motivation to continue care and her children are a source of motivation. She also shared motivation to travel once she is discharged from hospital. SW provided reflective listening to Pt and validated her thoughts/feelings.     AHCD: SW previously explored AHCD with pt's daughter and she was instructed to provide a copy. Pt's daughter, [name] and son, Frederico Hamman participated in part of SW visit today. [name] provided copy of AHCD to be placed in patient's medical records. Pt confirmed that her daughter, [name] is her primary medical DPOA. Pt and caregiver denied having any advanced care planning conversations. SW encouraged conversations surrounding wishes and Pt was agreeable to plan. SW offered to remain supportive through conversations. AHCD copy provided to nursing unit staff to be scanned in medical records and original copy returned to pt's caregiver.     SW offered additional support and Pt declined any further needs. All SW contact information was provided.     Assessment:     Pt had mood/affect congruent to situation. She appeared oriented to place and time. She openly  engaged in conversation and shared about her life as a mother and daughter. Pt appears to have strong support from her children and motivated to continue treatment to improve her quality of life. Pt appears receptive to ongoing SW support.       Plan:     SW will be available for any further needs.       Enid Baas, MPH   Clinical Social Worker   Inpatient Hematology/ Oncology  [phone]

## 2021-12-30 NOTE — Progress Notes (Addendum)
[facility] PROGRESS NOTE  ATTENDING ONLY     My date of service is 12/30/21.    24 Hour Course  6:50 PM clay colored stool reported.  8:38 PM: Patient has agitated and delerium at night. Last EKG QTc 526.  Patient was refusing EKG, held off on seroquel. Discontinued reglan. Consider giving risperidone or ativan.      Subjective  Doing ok without specific complaints besides diarrhea. Leaving for RUQ [place].  On re-evaluation, fatigued. Pt reports diarrhea and fear of it happening has her paralyzed with anxiety when doing physical activity, as she has diarrhea without warning. Due to the volume, she also expresses some embarrassment at having to be cleaned each time.    Vitals  Temp:  [36.2 C (97.2 F)-36.8 C (98.2 F)] 36.3 C (97.3 F)  Heart Rate:  [80-99] 88  *Resp:  [16-18] 18  BP: (82-103)/(53-73) 98/68  SpO2:  [95 %-98 %] 96 %    MostRecent Weight: 85.6 kg (188 lb 11.4 oz)  Admission Weight: 79.6 kg (175 lb 7.8 oz)      Intake/Output Summary (Last 24 hours) at 12/30/2021 1257  Last data filed at 12/30/2021 4917  Gross per 24 hour   Intake 3676 ml   Output 2850 ml   Net 826 ml     Pain Score: 6    Physical Exam  Vitals reviewed.   Constitutional:       Appearance: She is ill-appearing.   HENT:      Head: Normocephalic and atraumatic.      Nose: Nose normal.      Mouth/Throat:      Mouth: Mucous membranes are moist.      Pharynx: Oropharynx is clear.   Eyes:      General: Scleral icterus present.      Extraocular Movements: Extraocular movements intact.      Pupils: Pupils are equal, round, and reactive to light.   Cardiovascular:      Rate and Rhythm: Normal rate and regular rhythm.      Pulses: Normal pulses.      Heart sounds: Normal heart sounds.   Pulmonary:      Effort: Pulmonary effort is normal.      Breath sounds: Normal breath sounds.   Abdominal:      General: Abdomen is flat. Bowel sounds are normal.      Palpations: Abdomen is soft.   Musculoskeletal:         General: No swelling.  Normal range of motion.      Cervical back: Normal range of motion and neck supple.      Right lower leg: No edema.      Left lower leg: No edema.   Skin:     General: Skin is warm and dry.      Capillary Refill: Capillary refill takes less than 2 seconds.      Coloration: Skin is jaundiced.   Neurological:      General: No focal deficit present.      Mental Status: She is alert and oriented to person, place, and time.      Comments: Slow to respond   Psychiatric:         Mood and Affect: Mood normal.         Speech: Speech is delayed.         Behavior: Behavior is slowed.     Scheduled Meds:   0.9% sodium chloride flush  3 mL Intravenous Q12H [facility]  dicyclomine  20 mg Oral 4x Daily [place]    dry mouth oral rinse  5 mL Mucous Membrane 4x Daily [place]    ergocalciferol (vitamin D2)  50,000 Units Oral Q7 Days [place]    ganciclovir (CYTOVENE) IVPB  5 mg/kg (Dosing Weight) Intravenous Q12H [place]    multivitamin complete chewable  1 tablet Oral Daily [place]    nystatin  1 Application Topical BID [place]    potassium chloride in sterile water  20 mEq Intravenous Once    QUEtiapine  50 mg Oral Once    rifAXIMin  200 mg Oral BID SCH    thiamine  200 mg Intravenous Q24H    ursodioL  600 mg Oral BID [place]    vitamin E (dl, acetate)  800 Units Oral Daily [place]     Continuous Infusions:   dextrose 5 % and 0.9 % sodium chloride 75 mL/hr (12/30/21 0659)     PRN Meds:   0.9% sodium chloride flush  3 mL Intravenous PRN    albuterol  2.5 mg Nebulization Q4H PRN    Or    albuterol  2.5 mg Nebulization Q4H PRN    diphenhydrAMINE  50 mg Intravenous Once PRN    EPINEPHrine  0.3 mg Intramuscular Once PRN    hydrocortisone  100 mg Intravenous Once PRN    HYDROmorphone  0.3-0.8 mg Intravenous Q4H PRN    LORazepam  0.5 mg Oral Once PRN    magnesium sulfate in dextrose 5 %  2-4 g Intravenous Daily PRN    Or    magnesium sulfate in water  2-4 g Intravenous Daily PRN    ondansetron  8 mg Intravenous Q8H PRN    polyethylene glycol  17 g  Oral Daily PRN    potassium chloride in sterile water  20-80 mEq Intravenous Daily PRN    Or    potassium chloride in sterile water  20-80 mEq Intravenous Daily PRN    Or    potassium chloride  20-80 mEq Oral Daily PRN     Data    CBC        12/30/21  0405 12/29/21  0421   WBC 7.0 7.9   HGB 7.8* 8.3*   HCT 22.7* 24.6*   PLT 74* 76*     Coags        12/30/21  0405 12/29/21  0421   INR 1.3* 1.3*     Chem7        12/30/21  0405 12/29/21  1712 12/29/21  0421   NA 141 141 140   K 2.7* 3.7 3.3*   CL 111* 113* 110   CO2 25 20* 24   BUN 4* 5* 3*   CREAT 0.40* 0.45* 0.34*   GLU 91 121 94     Electrolytes        12/30/21  0405 12/29/21  1712 12/29/21  0421   CA 7.5* 7.6* 7.6*   MG 1.9  --  1.9     Liver Panel        12/30/21  0405 12/29/21  0421   AST 88* 81*   ALT 66* 62*   ALKP 504* 513*   TBILI 14.5* 14.6*   TP 3.5* 3.6*   ALB 1.2* 1.2*     Lactate  No results found in last 36 hours    Microbiology Results (last 24 hours)     Procedure Component Value Units Date/Time    Cytomegalovirus DNA, Quantitative PCR, Plasma [[id number]] Collected:  12/30/21 0400    Order Status: Sent Specimen: Not Applicable from Serum Updated: 12/30/21 0451    Hepatitis E Antibody, IgM [[id number]] Collected: 12/23/21 1303    Order Status: Completed Specimen: Not Applicable from Serum Updated: 12/29/21 1623     Hep E Ab IgM NOT DETECTED     Comment: (NOTE)  REFERENCE RANGE: NOT DETECTED    Hepatitis E Virus (HEV) is a major cause of  enteric non-A hepatitis worldwide. HEV IgM is  typically detected within 2-4 weeks after  infection, and then persists for about two months    This test was developed and its analytical performance  characteristics have been determined by [employer].  It has not been cleared or approved by FDA. This assay has  validated pursuant to the CLIA regulations and is used  for clinical purposes.    Test Performed at:  [facility]  [address] [place]  [address]     Knute Neu MD, PhD         COVID-19 RNA, RT-PCR/Nucleic Acid Amplification RETEST (Asymptomatic and No Specific COVID-19 Suspicion); No; No; No; No Procedure Planned; Unknown [[id number]] Collected: 12/29/21 0510    Order Status: Completed Specimen: Not Applicable from Bilateral Anterior Nares +/- OP Swab Updated: 12/29/21 1440     COVID-19 RNA, RT-PCR/Nucleic Acid Amplification Not detected     Comment: (Reported by Lab to Public Health.)  (Results reported to [facility])          Comments See Comment     Comment: Bilateral Anterior nares and OP Swab  in universal transport medium             Radiology Results  No results found.     I discussed the patient with Kari Baars, MD from [facility] regarding A&P.    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 The patient has received VRd X 4 (achieving VGPR) then Mel 200 ASCT 05/12/11. 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 She met the criteria for PD on 02/19/2020 and was started on third line DARA PD in June 2021, which was recently held on 10/20/21 due to chronic diarrhea. Patient was transferred OSH after recent hypovolemic/distributive shock. Here for acute encephalopathy workup and acute liver failure workup.      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   June 2020 due to progression of M spike Revlimid was increased to 15 mg PO D1-21 every 28 days, plus dexamethasone  July 2021: started daratumumab, Pomylast 2 mg D 1-21, Q28 days, dexamethasone 12 mg PO Q weekly.   Primary oncologist: Dr. Iona Coach,  Dr. Kennon Rounds [place] [facility]  Receiving dexamethasone 12 mg weekly for diarrhea  Receiving MT with daratumumab with pomalyst and dex, most recent dose 10/16/21, has since been on  hold due to worsening diarrhea.     DATA:   09/07/2010: IgG Kappa MM  12/02/2021: Ig M <5, IgG 447, IgA 20, Ig E <2, SFLC Kappa 16.6, Lambda 1.6, Kappa/Lamda 10.38  12/14/21: IgG K on IFE, IgG 430, KLC 16.6, LLC 2.7, KLR 6.15    Chemo:   Holding Daratumumab and Pomalyst, last  dose 10/20/21, given recent infection and acute liver failure.    Supportive Care:   Transfusion: Keep Hg >7, and Platelets >10.   Per daughter, has autoantibodies in blood was a difficult match. Would like to be contacted prior to transfusion.   Line: PICC placed 1/26  Outpatient Oncologist: Dr. Kennon Rounds    Dispo: TBD, after resolution of sepsis and liver failure. May also transfer back to OSH once stabilized    # Immunocompromised  # CMV colitis  # At risk for infections due to malignancy  # Shock, likely hypovolemic vs distributive in the setting of chronic diarrhea, + norovirus infection   # Acute on chronic diarrhea, norovirus positive., diff and OP negative.   -No prior evidence of adrenal insufficiency, AM cortisol levels normal, previously on dexamethasone 8 mg PO Qweekly, but has been held since early December.    Data:  - CT brain: No acute intracranial hemorrhage, herniation, or hydrocephalus.  - CT chest/A/P pending: Medium-sized bilateral pleural effusions with bilateral peribronchovascular groundglass opacities, suggestive of pulmonary edema. Clustered nodules in right middle lobe along the right minor fissure, which could represent mild aspiration or infection. Numerous sclerotic and mixed lytic/sclerotic lesions throughout the thoracic spine and bilateral ribs, increased in size and number compared to prior. Appearance is atypical for multiple myeloma but may represent a combination of treated, partially treated, and active disease. No acute pathologic fracture. Findings compatible with enterocolitis of infectious or inflammatory etiology. Involvement of the colon is greater than of the small bowel. Marked hepatic steatosis.  Increased sclerosis of bone lesions throughout the thoracolumbar spine, bony pelvis, and proximal left femur. Appearance is atypical for multiple myeloma but may represent a combination of treated, partially treated, and active disease. No acute pathologic fracture.  - 1/29:CMV PCR [id number]  - 1/31: GI PCR panel positive only for Norovirus.   2/1: LP results: Neutrophils 6% in tube 1, Lymphocytes 88% (predominant), protein 42 (normal), glucose 36 (low), rapid HSV in CSF negative. CMV CSF PCR negative    - 2/2 CMV PCR 179,555High   - 2/2 Flex Sig Biopsies prelim: "colonic mucosa with a rare small non-necrotizing mucosal granuloma and rare CMV immunolabeling"   - 2/5 [place] work up: sIL2 (1556.6 high), ferritin (948 high), TG(258 high), NKA-IFN? 10 (WNL)  - 2/8: CMV PCR: 3107 (sig improvement)  - 2/10: C diff: negative.  - 2/12: Repeat GI viral PCR: positive for Norovirus    Plan:  - appreciate ID reccs   - Ganciclovir 5 mg/kg (1/31-). S/p one dose of IVIG on 2/7. Can switch to oral antiviral once the diarrhea improves.              - If again Norovirus positive, can consider another IVIG and consider touching base with GI.   - GI consult: appreciate reccs: flex sig 2/2    - stop zosyn: s/p IV Zosyn 4.5 g Q8hrs (1/26-1/28, restarted 1/29 given recurrent fever-12/16/21)  - 2/15 weekly CMV pending  - OI ppx: Acyclovir IV    # Grade III neutropenia, at baseline  # Leukopenia  #Thrombocytopenia (possibly 2/2 liver disease)  Data: WBC of 2.2 on 12/05/21, on Zarxio at home  Plan: Continue to monitor    # Acute liver injury of unclear etiology  # Acute on chronic encephalopathy likely 2/2 infectious vs hepatic vs metabolic  # Hyperbilirubinemia  # Anasarca  # Hypoalbuminemia    Data:   -Steady rise in LFTs in 05/2021,  -12/02/21: AST 152--> 121,  ALT 130--> 110, TB 5.4, Albumin 1.1  -Hepatitis panel negative.   -1/26:  Tbili 14.6, GGT 1160, ammonia 25  -CTAP 12/02/21: Scattered sclerotic lesions in the lumbar spine, hepatic  steatosis.   -RUQ ultrasound: liver enlarged with diffuse increased echogenicity, consistent with fatty infiltration. 2cm stone in the gallbladder.   -HIDA scan: Normal radiotracer filling of the gallbladder is identified, therefore the exam is not consistent with cystic duct obstruction or acute cholecystitis.  - [place] abd 1/27: Hepatomegaly with diffusely increased echogenicity which can be seen with hepatic amyloidosis or steatosis. Please refer to the recently performed biopsy for further details. Mild perihepatic ascites. Patent hepatic vasculature with normal waveforms. Cholelithiasis without acute cholecystitis. Right pleural effusion.  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 CMV stains: Negative.  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 Could be drug related. Increase Ursodiol to 600 mg BID, Check Hepatitis E. 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 Unlikely diarrhea from Ursodiol.                         - Consider avoiding steroid    - Daily LFT/INR  - RD consult placed. Protein shakes to use. Family requesting appetite stimulant. Potential harm than benefit. Pt starting to eat better now.    # Acute toxic encephalopathy, improved  In the setting of liver failure, ammonia only 25.  LP performed 2/1 to r/o encephalitis.     Data:   1/31: HIV, TSH, Folate, B12 WNL  1/30: CT head WNL other than sinus disease  2/1: LP results: Neutrophils 6% in tube 1, Lymphocytes 88% (predominant), protein 42 (normal), glucose 36 (low), rapid HSV in CSF negative. CMV CSF PCR negative  2/5: Brain MRI: Smooth diffuse dural thickening and enhancement as well as bilateral holohemispheric subdural effusions, which may be on the basis of diminished intracranial pressure following recent lumbar puncture.  -2/8: EEG: non-specific mild cerebral dysfunction.  -2/9: CT Brain: similar to prior MRI with hyperdense areas likely hygromas    Plan:  - Neurology consult: 2/7: Encephalopathy likely from underlying medical disease. Delirium prevention. Avoid Ativan and try Seroquel for delirium. MRI findings is from LP. Okay to try caffiene tablets in the daytime to help with postural headache but patient is allergic to it.  -2/8: Repeat CT head to monitor the status of subdural effusion: stable, EEG: diffuse slowing, Thiamine iv prophylaxis.  -Speech path: Regular and thin liquid. Slightly higher risk for aspiration given encephalopathy.  - Start empiric rifaximin given possibility of HE without elevated ammonia  -- holding off on lactulose given diarrhea    # Hypokalemia:  # Hypomagnesemia:  Replete as needed.    # Vitamin D deficiency:  -Supplements ordered.    # Moderate malnutrition:  -Seen by RD and given  recs for supplements.    # History of PE, diagnosed in April 2019  - hold home [place] for now given recent anemia and concerns for bleeding.   - monitor for signs and symptoms of worsening bleeding.     # Elevated TSH at OSH, likely 2/2 subclinical hypothyroidism  -TSH WNL on 12/15/20.      # Anxiety, emotional distress:   -Patient has stressful social situation, intellectually disabled son, and regarding underlying disease process.     #Chronic buttocks pressure wound  - Wound care consult, appreciate recs  - Position  changes as tolerated, waffle cushion, etc  -- Pressure wound ruled out, treating bilateral buttocks moisture and friction damage     #VTE PPx:  Contraindicated: Other: anticipate Plt <50    Severity of Illness  Stable     During this hospitalization the patient is also being treated for:  - Sepsis (present on admission)    - Malignancy associated fatigue on admission  - Neoplasm/malignancy associated pain  - Hypoalbuminemia (present on admission)  - Fluid status: Hypovolemia on admission     - Cachexia  - Encephalopathy: Metabolic encephalopathy     Code Status: FULL    [name], MD  12/30/21

## 2021-12-30 NOTE — Consults (Signed)
Wound Note  Attempted to evaluate/assess the pt for MASD wound recommendations for inner thighs and reassessment of skin tears on bilateral buttocks however pt off the floor at ultra sound will try back later, time permitting. DSandman, RN, MSN, [facility], [place]

## 2021-12-30 NOTE — Consults (Signed)
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 The patient has received VRd X 4 (achieving VGPR) then Mel 200 ASCT 05/12/11. 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 She met the criteria for PD on 02/19/2020 and was started on third line DARA PD in June 2021, which was recently held on 10/20/21 due to chronic diarrhea. Patient was transferred OSH after recent hypovolemic/distributive shock. Here for acute encephalopathy workup and acute liver failure workup.  Assessment:      Recommendations    Recommended Discharge Disposition Placement for continued therapy       Discharge DME Recommendations Defer to next level of care   Equipment Recommendations     DME Comment     Discharge Recommendations Comment     Rehab Potential Patient participates well in therapy and is progressing towards goal   Anticipated Assistance Available at Discharge Family;Spouse/Significant other   Anticipated Type of Assistance Available at Discharge Assist as needed   Anticipated Time of Assistance Available at Discharge Unknown at this time   Barriers to Discharge Medical issues;Impaired Cognition;Insufficient activity tolerance;Behavioral issues;Insufficient physical ability   Patient's current functional ability appropriate for D/C recommendations No     Inpatient recommendations  OT Inpatient Recommendations     Recommendation Comments     Current Maximal Level of Assist Needed       Prior Level of Function  Prior living environment comments:   Available equipment or existing home modifications:   Prior functional limitations:   Social supports:   Means to access community:   Occupation(s), roles and routines, exercise habits:   Fall history:   Fall history details:   Additional comments:          Currently in pain  Currently in pain: No  Pain location: bottom wound during rolling  Pain scale:  Wong-Baker  Pain description: moaning unable to describe and localize      Brace/Precautions   Brace/Orthotic/Prosthetic:   Precautions:Has weight bearing limitation or precaution: Yes  Precautions and weight bearing status comments: delrium, falls, skin     Subjective Report:"You don't control me, not even my daughter controls me"    Patient/Family Goal:     Objective Findings and Interventions    Areas of Occupation    Grooming and Light Hygiene     Cueing Required     Comment     Self Feeding     Cueing Required     Comment     Chartered loss adjuster Required     Comment     Upper Body Dressing     Cueing Required     Comment     Lower Body Dressing     Cueing Required     Comment     Upper Body Bathing     Cueing Required     Comment     Lower Body Bathing     Cueing Required     Comment     ADL/IADL Comment         Functional Transfers    Bed Mobility From Supine   Bed Mobility To Sitting EOB   Level of Assist     Cueing Required     Transfer From     Transfer To  Level of Assist Dependent   Cueing Required     Technique     Functional Transfer Comment Daughter and son present. Pt reported feeling comfortable upon arrival but very busy day having left floor from ultrasound. Provided increased education regading OOB activity and sling to chair for ADLs. Pt adamently refusing OOB activity due to fatigue and not wanting to be OOB. 2p max A HOB. Bed left in chair position.         [place] for ADLs  6 Click Score: 11    Client Factors  Cognitive deficits noted in the following area(s):: Memory, Judgement/Safety  Cognition comment:Knew therapist was familiar did not recall previous day or session.    Initiation;Organization;Sequencing;Problem ID;Problem solving;Information processing;Safety awareness  Executive functions comment: very impaired reasoning, insight and logic refusing OOB                                                   OT  Education       OT Plan of [place], OT  12/30/2021 3:53 PM

## 2021-12-30 NOTE — Interdisciplinary (Addendum)
CASE MANAGEMENT ADULT  ASSESSMENT     CM ADULT ASSESSMENT (most recent)     CM Adult Assessment - 12/29/21 1531        Screening    Any anticipated barriers to discharge? No     Does patient have any discharge planning needs? Yes        Adult Assessment    Transfer from outside facility Yes     Transfer Back Agreement Yes     Patient has an inpatient admission at any facility within last 30 days? No     Does the Patient have a PCP? Yes     PCP name and contact information Outpatient Oncologist: Dr. Kennon Rounds @ [facility]  Dr. Iona Coach is the local onc        Caregiver and Family Support    CM Assessment completed with [name], daughter        Living Arrangements and Services    *Who does the patient live with? Other Family Members   2 adult children (son and daughter) and spouse, and pt mother    *Patient's Current Residence Apartment/ House     Address Type Permanent     Residential Access Stairs/Steps (specify #)   3 STE, 14 stairs to 2nd story where BR and full BA are located    *Support Systems Other family members        Functional Status    Prior ADLs Independent     Current ADLs Needs Assistance     Ambulation (current) Needs Assistance     Transfers (current) Needs Assistance     Bathing/Showering (current) Needs Assistance     Toileting (current) Needs Assistance     Dressing (current) Needs Assistance     Prior IADLs Needs Assistance     Medication (prior) Independent     Cooking/Groceries (prior) Independent     Laundry/Cleaning (prior) Independent     Finances (prior) Independent     Current IADLs Needs Assistance     Prior Mental Status Alert, oriented     Current Mental Status Alert, oriented;Lethargic        Proposed Discharge Plan    Discharge to [place] [facility] Discharge Process Patient/Surrogate decision-maker understands/agrees with discharge plan               Pam Drown  MSN, RN, CM  [facility]  Hematology, Oncology

## 2021-12-30 NOTE — Consults (Signed)
[facility] Team Consultation    Patient: Ariel Braun   DOB: 01-25-1961   Age: 61 y.o.  Admit Date: 12/09/2021  Length of Stay: 21 days  Unit: L1122/L1122-01     Wound Care Consult: initial and virtual   Patient evaluated via virtual consultation. Information obtained via photo capture & EMR review. This plan of care would not differ from an in-person assessment.    Reason for Consultation: Consulted by: RN for Incontinence associated dermatitis and/or moisture associated skin damage assessment and treatment recommendations.     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 The patient has received VRd X 4 (achieving VGPR) then Mel 200 ASCT 05/12/11. 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 She met the criteria for PD on 02/19/2020 and was started on third line DARA PD in June 2021, which was recently held on 10/20/21 due to chronic diarrhea. Patient was transferred OSH after recent hypovolemic/distributive shock. Here for acute encephalopathy workup and acute liver failure workup.     Surgical History:   Past Surgical History:   Procedure Laterality Date    AUTOLOGOUS STEM CELL TRANSPLANTATION  05/12/11    BREAST CYST EXCISION  1981       Braden Score:  12   Sub-scale: *Mobility: Very limited  Pressure Injury Risk Category: High risk (10-12)     Nutrition: Diet Type: Regular        Wound Assessment(s):  Bilateral Groin/Thighs  Diffuse brightly erythematous rash with reticulated epidermal peeling.      Bilateral Buttocks  Denudation/erosion to bilateral buttocks with yellow slough tissue to wound beds, periwounds with erythema & diffuse epidermal peeling. Patient is incontinent of watery stool. Due to watery stool  incontinence, continue  to recommend the use of topical barrier as opposed to Mepilex as Mepilex can trap moisture/incontinence against the skin & contribute to breakdown.   Patient at increased risk of skin breakdown/impaired wound healing related to intrinsic factors including immunocompromise, acute on chronic diarrhea, shock/sepsis, acute liver injury, anasarca, hypoalbuminemia.     2/15      1/27 (See Stacey Drain wound care note for session details)        Recommendations:  Bilateral Groin/Thighs  Gently cleanse with warm water & Remedy no-rinse foam (pH-balanced cleanser) with soft cloths, pat dry ensuring thoroughly dried.   Do not use CHG to rash areas.  Apply Clotrimazole ointment to rash BID, ensure thoroughly rubbed in.   Apply a piece of Interdry textile [phone]) leaving 2 inches exposed from fold to facilitate wicking.    Bilateral Buttocks  First rinse with perineal irrigation bottle & warm water to remove as much stool as possible to minimize wiping.   Cleanse wounds & surrounding skin with Anasept wound cleanser spray (XKG#[id number]), pat dry with sterile gauze.  Apply a thick layer of Triad hydrophilic wound dressing paste (JSH#[id number]) over wounds & periwound areas.  Promptly clean patient after episodes of incontinence using method described.   Remove only the top, soiled layer of Triad during incontinence cleaning to avoid further eroding the skin & apply another top layer of Triad following cleaning.   Apply Triad at least BID & PRN to keep area protected.  Pressure Injury Prevention  Continue q2h repositioning- 30 degree lateral turn to L & R only unless medically contraindicated to avoid direct pressure on sacrum.  Place Waffle mattress overlay [phone]) under patient in bed to assist with pressure redistribution.  Use waffle cushion while OOB to chair, reposition frequently while in chair every 30 minutes.   Additionally, float heels completely off of bed. If pillows do not suffice, order heel offloading  boots (PMM# [id number]).      Notified assigned staff RN and first call provider of assessment findings, wound type, and the projected plan of care.  Thank you for allowing me to participate in the care of this patient. The wound care consultation is complete and the patient will not be added to the wound care rounding schedule . Wound and ostomy specialist consultation are available by the request of staff. Please re-consult for new wound(s) or deterioration of known wound(s).     Rankin Coolman, BSN, RN-BC, CWOCN  Wound & Ostomy Clinical Nurse

## 2021-12-30 NOTE — Plan of Care (Signed)
Pt AVSS thus far into shift. Turning pt q2hrs. Pt had 1 x incontinent stool. Sites cleansed and ointments applied per orders. Will CTM.     Problem: Fall, at Risk or Actual - Adult  Goal: Absence of falls and fall related injury  Outcome: Progress within 12 hours  Goal: Knowledge of fall prevention  Outcome: Progress within 12 hours     Problem: Delirium - Adult / Pediatric  Goal: Absence or resolution of delirium  Outcome: Progress within 12 hours     Problem: Pain,  Acute / Chronic- Adult  Goal: Control of chronic pain  Outcome: Progress within 12 hours  Goal: Control of acute pain  Outcome: Progress within 12 hours     Problem: Activity Intolerance / Fatigue - Hematological / Immunological / Oncological Condition - Adult  Goal: Able to perform physical activity as ordered  Outcome: Progress within 12 hours     Problem: Coping Ineffective, Patient / Family / Caregiver - Hematological / Immunological / Oncological Condition - Adult  Goal: Effective coping (Patient / Family / Caregiver)  Outcome: Progress within 12 hours     Problem: Fluid Volume, Imbalanced - Hematological / Immunological / Oncological Condition - Adult  Goal: Absence of fluid imbalance signs and symptoms  Outcome: Progress within 12 hours     Problem: Gas Exchange, Impaired- Hematological / Immunological / Oncological Condition - Adult  Goal: Adequate oxygenation (absence of signs and symptoms of hypoxemia)  Outcome: Progress within 12 hours     Problem: GI Elimination Impaired - Hematological / Immunological / Oncological Condition - Adult  Goal: Passage of soft, formed stool  Outcome: Progress within 12 hours     Problem: GU Elimination Impaired - Hematological / Immunological / Oncological Condition - Adult  Goal: Absence of urinary tract infection signs and symptoms  Outcome: Progress within 12 hours     Problem: Infection, at Risk and Actual - Hematological / Immunological / Oncological Condition - Adult  Goal: Prevention of  infection  Outcome: Progress within 12 hours     Problem: Injury, High Risk for - Hematological/Immunological/Oncological Condition - Adult  Goal: Absence of injury  Outcome: Progress within 12 hours     Problem: Nutrition, Alteration in - Hematological / Immunological / Oncological Condition - Adult  Goal: Adequate nutritional intake  Outcome: Progress within 12 hours     Problem: Oral Mucus Membranes, at Risk or Alteration in Integrity - Immunological / Oncological Condition - Adult  Goal: Maintain or restore integrity of oral mucus membranes  Outcome: Progress within 12 hours     Problem: Skin Integrity, Impaired - Hematological / Immunological / Oncological Condition - Adult  Goal: Skin / incision / wound healing  Outcome: Progress within 12 hours

## 2021-12-31 LAB — COMPREHENSIVE METABOLIC PANEL (BMP, AST, ALT, T.BILI, ALKP, TP ALB)
AST: 88 U/L — ABNORMAL HIGH (ref 5–44)
Alanine transaminase: 64 U/L — ABNORMAL HIGH (ref 10–61)
Albumin, Serum / Plasma: 1.2 g/dL — ABNORMAL LOW (ref 3.4–4.8)
Alkaline Phosphatase: 485 U/L — ABNORMAL HIGH (ref 38–108)
Anion Gap: 5 (ref 4–14)
Bilirubin, Total: 13.1 mg/dL — ABNORMAL HIGH (ref 0.2–1.2)
Calcium, total, Serum / Plasma: 7.4 mg/dL — ABNORMAL LOW (ref 8.4–10.5)
Carbon Dioxide, Total: 23 mmol/L (ref 22–29)
Chloride, Serum / Plasma: 115 mmol/L — ABNORMAL HIGH (ref 101–110)
Creatinine: 0.39 mg/dL — ABNORMAL LOW (ref 0.55–1.02)
Glucose, non-fasting: 78 mg/dL (ref 70–199)
Potassium, Serum / Plasma: 3.1 mmol/L — ABNORMAL LOW (ref 3.5–5.0)
Protein, Total, Serum / Plasma: 3.3 g/dL — ABNORMAL LOW (ref 6.3–8.6)
Sodium, Serum / Plasma: 143 mmol/L (ref 135–145)
Urea Nitrogen, Serum / Plasma: 4 mg/dL — ABNORMAL LOW (ref 7–25)
eGFRcr: 113 mL/min/{1.73_m2} (ref >59)

## 2021-12-31 LAB — COMPLETE BLOOD COUNT WITH DIFFERENTIAL
Abs Basophils: 0 10*9/L (ref 0.00–0.10)
Abs Eosinophils: 0.01 10*9/L (ref 0.00–0.40)
Abs Imm Granulocytes: 0.07 10*9/L (ref <0.10)
Abs Lymphocytes: 0.22 10*9/L — ABNORMAL LOW (ref 1.00–3.40)
Abs Monocytes: 0.11 10*9/L — ABNORMAL LOW (ref 0.20–0.80)
Abs Neutrophils: 4.78 10*9/L (ref 1.80–6.80)
Hematocrit: 21.9 % — ABNORMAL LOW (ref 36.0–46.0)
Hemoglobin: 7.2 g/dL — ABNORMAL LOW (ref 12.0–15.5)
MCH: 35.8 pg — ABNORMAL HIGH (ref 26.0–34.0)
MCHC: 32.9 g/dL (ref 31.0–36.0)
MCV: 109 fL — ABNORMAL HIGH (ref 80–100)
MPV: 13.2 fL — ABNORMAL HIGH (ref 9.1–12.6)
Platelet Count: 64 10*9/L — ABNORMAL LOW (ref 140–450)
RBC Count: 2.01 10*12/L — ABNORMAL LOW (ref 4.00–5.20)
RDW-CV: 25 % — ABNORMAL HIGH (ref 11.7–14.4)
WBC Count: 5.2 10*9/L (ref 3.4–10.0)

## 2021-12-31 LAB — BASIC METABOLIC PANEL (NA, K, CL, CO2, BUN, CR, GLU, CA)
Anion Gap: 4 (ref 4–14)
Calcium, total, Serum / Plasma: 7.6 mg/dL — ABNORMAL LOW (ref 8.4–10.5)
Carbon Dioxide, Total: 23 mmol/L (ref 22–29)
Chloride, Serum / Plasma: 115 mmol/L — ABNORMAL HIGH (ref 101–110)
Creatinine: 0.37 mg/dL — ABNORMAL LOW (ref 0.55–1.02)
Glucose, non-fasting: 97 mg/dL (ref 70–199)
Potassium, Serum / Plasma: 3.5 mmol/L (ref 3.5–5.0)
Sodium, Serum / Plasma: 142 mmol/L (ref 135–145)
Urea Nitrogen, Serum / Plasma: 4 mg/dL — ABNORMAL LOW (ref 7–25)
eGFRcr: 115 mL/min/{1.73_m2} (ref >59)

## 2021-12-31 LAB — PROTHROMBIN TIME
INR: 1.3 — ABNORMAL HIGH (ref 0.9–1.2)
PT: 15.9 s — ABNORMAL HIGH (ref 11.6–15.0)

## 2021-12-31 LAB — MAGNESIUM, SERUM / PLASMA: Magnesium, Serum / Plasma: 2.1 mg/dL (ref 1.6–2.6)

## 2021-12-31 LAB — BILIRUBIN, DIRECT: Bilirubin, Direct: 9.5 mg/dL — ABNORMAL HIGH (ref <0.6)

## 2021-12-31 LAB — FIBRINOGEN, FUNCTIONAL: Fibrinogen, Functional: 156 mg/dL — ABNORMAL LOW (ref 202–430)

## 2021-12-31 LAB — POCT GLUCOSE: Glucose, Glucometer: 89 mg/dL (ref 70–199)

## 2021-12-31 MED ORDER — NITAZOXANIDE 100 MG/5 ML ORAL SUSPENSION
100 | Freq: Two times a day (BID) | ORAL | Status: DC
Start: 2021-12-31 — End: 2022-01-02
  Administered 2022-01-01: 04:00:00 500 mg via ORAL
  Administered 2022-01-01: 18:00:00 via ORAL
  Administered 2022-01-02: 06:00:00 500 mg via ORAL

## 2021-12-31 MED ORDER — LOPERAMIDE 2 MG TABLET
2 | Freq: Four times a day (QID) | ORAL | Status: DC | PRN
Start: 2021-12-31 — End: 2022-01-15
  Administered 2022-01-01 – 2022-01-15 (×16): via ORAL

## 2021-12-31 MED ORDER — RIFAXIMIN 550 MG TABLET
550 | Freq: Two times a day (BID) | ORAL | Status: DC
Start: 2021-12-31 — End: 2021-12-31

## 2021-12-31 MED ORDER — RIFAXIMIN 550 MG TABLET
550 mg | Freq: Three times a day (TID) | ORAL | Status: DC
Start: 2021-12-31 — End: 2022-01-06
  Administered 2021-12-31 – 2022-01-06 (×18): via ORAL

## 2021-12-31 MED ORDER — POTASSIUM CHLORIDE 40 MEQ/100ML IN STERILE WATER INTRAVENOUS PIGGYBACK
40 | Freq: Once | INTRAVENOUS | Status: AC
Start: 2021-12-31 — End: 2021-12-31
  Administered 2022-01-01: 02:00:00 40 meq via INTRAVENOUS

## 2021-12-31 MED FILL — FLINTSTONES COMPLETE (IRON) CHEWABLE TABLET: ORAL | Qty: 1

## 2021-12-31 MED FILL — GANCICLOVIR SODIUM 500 MG INTRAVENOUS SOLUTION: 500 mg | INTRAVENOUS | Qty: 8

## 2021-12-31 MED FILL — ONDANSETRON HCL (PF) 4 MG/2 ML INJECTION SOLUTION: 4 mg/2 mL | INTRAMUSCULAR | Qty: 4

## 2021-12-31 MED FILL — DICYCLOMINE 10 MG CAPSULE: 10 mg | ORAL | Qty: 2

## 2021-12-31 MED FILL — ALINIA 100 MG/5 ML ORAL SUSPENSION: 100 mg/5 mL | ORAL | Qty: 25

## 2021-12-31 MED FILL — CLOTRIMAZOLE 1 % TOPICAL CREAM: 1 % | TOPICAL | Qty: 15

## 2021-12-31 MED FILL — POTASSIUM CHLORIDE 40 MEQ/100ML IN STERILE WATER INTRAVENOUS PIGGYBACK: 40 mEq/100 mL | INTRAVENOUS | Qty: 100

## 2021-12-31 MED FILL — THIAMINE HCL (VITAMIN B1) 100 MG/ML INJECTION SOLUTION: 100 mg/mL | INTRAMUSCULAR | Qty: 2

## 2021-12-31 MED FILL — XIFAXAN 200 MG TABLET: 200 mg | ORAL | Qty: 1

## 2021-12-31 MED FILL — URSODIOL 300 MG CAPSULE: 300 mg | ORAL | Qty: 2

## 2021-12-31 MED FILL — VITAMIN E (DL, ACETATE) 45 MG (100 UNIT) CAPSULE: 45 mg (100 unit) | ORAL | Qty: 8

## 2021-12-31 MED FILL — XIFAXAN 550 MG TABLET: 550 mg | ORAL | Qty: 1

## 2021-12-31 NOTE — Interdisciplinary (Signed)
SOCIAL WORK NOTE    Data:     SW attempted to meet with patient for ongoing support, but Pt was sound asleep. SW will remain available for routine visits.     Assessment:     Deferred.     Plan:     SW will be available for any further needs.       Enid Baas, MPH   Clinical Social Worker   Inpatient Hematology/ Oncology  [phone]

## 2021-12-31 NOTE — Consults (Signed)
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 The patient has received VRd X 4 (achieving VGPR) then Mel 200 ASCT 05/12/11. 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 She met the criteria for PD on 02/19/2020 and was started on third line DARA PD in June 2021, which was recently held on 10/20/21 due to chronic diarrhea. Patient was transferred OSH after recent hypovolemic/distributive shock. Here for acute encephalopathy workup and acute liver failure workup    ASSESSMENT  Patient performed supine UE and LE exercises, following all commands. Extensive education provided to patient on OOB activity, and that she will never improve functionally if she does not get out of bed. Agreeable to getting out of bed at noon, and plan is for her RN to assist her OOB using the ceiling lift. Patient's progress in physical therapy is dependent upon her participating fully with rehab services. She will benefit from continued PT and placement for continued therapy to decrease caregiver burden with functional mobility.    Focus next session: upright tolerance, OOB, therex    RECOMMENDATIONS  DISCHARGE RECOMMENDATION  Comments Placement with ongoing PT needs      Patient Current Functional Status Sufficient for PT Discharge Recommendation Yes   Discharge DME needs TBD pending patient progress.   Discharge transportation needs [place]  Inpatient Rehab Assistive Device Recommendation Vertical dependent lift;Ceiling lift   Activity Recommendation Bed in chair position, OOB to chair with ceiling lift     SUBJECTIVE  Subjective report:  "You make it sound like I don't want to get up."  Notable observations:  Supine in bed with HOB elevated at beginning and end of session.    SYSTEMS REVIEW  Cognition/Communication  Delirium screen:  Yes Delirium Screen:   Details:   Encephalopathic    Communication  Cognition/communication impaired:  Yes     Orientation: Disoriented to event, Disoriented to time  Cognition: Decreased attention, Decreased insight to deficits, Decreased safety awareness  Behavior: Self-limiting  Behavior comment: Behavioral. Extensive education on the importance of OOB activity.    Integumentary  Integumentary deficits:  YesIntegumentary deficit detail:  Jaundiced, edematous, wounds at buttocks, foley    Cardiopulmonary  Cardiopulmonary deficits:  Yes  Detail:  Decreased activity tolerance    Musculoskeletal  Musculoskeletal deficits:  Yes  Abnormal strength findings: Bilateral shoulder flexion 3-/5 bilaterally, elbow flexion at least 3/5 bilaterally. Hip flexion 2+/5 bilaterally, knee extension 2+/5 bilaterally, ankle DF 3-/5 bilaterally    Neuromuscular  Neuromuscular deficits:  Yes  Motor control or coordination: Impaired at bilateral lower extremities >> bilateral upper extremities        Pain     Currently in pain: Yes  Pain location: c/o pain at wound on buttocks when performing knee flexion in bed          COMPREHENSIVE MOVEMENT ANALYSIS/TREATMENT  Precautions/WB status: Yes  Precautions and weight bearing status comments: delrium, falls, skin      Hemodynamic response:   Normal hemodynamic response: Yes  Response:     Comments:        Functional Mobility  Requires second person/additional health care providers: No    Bed mobility Current Initial    Rolling  Level of assist  Minimal assist  Maximal assist (12/15/21 0932)   Device   Log roll (12/15/21 0932)   Intervention  Rolling comments:  rolling L and R with nursing for peri-care, patient incontinent of BM at beginning of session. Rolling R at end of session to place pillow underneath patient with Min A    Balance  Balance deficits noted: Yes  Functional Balance for ADLs    Exercise/other interventions  Supine exercises comment (other exercise, details): ankle pumps 10 x 2, mini heel slides 10 x 2,  hip abduction<>adduction 10 x 2, bicep curls 10 x 2, chest press 10 x 2, shoulder roll 10 x 2    Communication between other health care providers: Communication between other health care provider: OT;RN  Communication comment:       Education Careers adviser: Patient  Content: Plan of care, Activity recommendations  Response: Needs reinforcement  Outcome measures   Physical Therapist Global Assessment of Mobility  Activity Achieved: Bed Exercises or Sitting edge of bed <5 minutes, cycle or Active ROM  AMPAC 6-clicks basic mobility score: 10      PLAN  Plan of care status:  Current plan of care remains appropriate  PT frequency:  5x/week  PT duration:  4 weeks.  Comment:  POC expires 2/26      Planned PT interventions:   Specific interventions: Progressive functional mobility training;Gait training;Balance training;NM Re-ed;Aerobic training;Ther ex  Education interventions: Caregiver training;Benefits of activity;Self-pacing/breathing;Exercise program;Mobility with precautions compliance;Fall risk reduction  Comment:          Leotis Shames, PT, DPT, NCS    12/31/2021

## 2021-12-31 NOTE — Consults (Addendum)
INFECTIOUS DISEASES FOLLOW UP CONSULT NOTE  ATTENDING ONLY     My date of service is 12/31/2021.    Interim History since previous Infectious Disease note  Diarrhea somewhat improved (less incontinent of stool), though still 5x day  Tested positive again for norovirus    Subjective  Continued loose stools as above    Medications  Scheduled Meds:   0.9% sodium chloride flush  3 mL Intravenous Q12H [place]    clotrimazole   Topical BID SCH    dicyclomine  20 mg Oral 4x Daily [place]    dry mouth oral rinse  5 mL Mucous Membrane 4x Daily [place]    ergocalciferol (vitamin D2)  50,000 Units Oral Q7 Days [place]    ganciclovir (CYTOVENE) IVPB  5 mg/kg (Dosing Weight) Intravenous Q12H [place]    multivitamin complete chewable  1 tablet Oral Daily [place]    nitazoxanide  500 mg Oral Q12H [place]    QUEtiapine  50 mg Oral Once    rifAXIMin  550 mg Oral TID [facility]    thiamine  200 mg Intravenous Q24H    ursodioL  600 mg Oral BID [place]    vitamin E (dl, acetate)  800 Units Oral Daily [place]     Continuous Infusions:   dextrose 5 % and 0.9 % sodium chloride 75 mL/hr (12/31/21 0402)       Antibiotic History  Ganciclovir  Nitazoxanide    High Medication Toxicity Risk: Yes    Vitals  Temp:  [36.4 C (97.5 F)-36.9 C (98.4 F)] 36.7 C (98.1 F)  Heart Rate:  [75-96] 78  *Resp:  [16-18] 18  BP: (92-111)/(65-79) 111/79  SpO2:  [95 %-96 %] 96 %      Intake/Output Summary (Last 24 hours) at 12/31/2021 1706  Last data filed at 12/31/2021 1430  Gross per 24 hour   Intake 4213 ml   Output 1750 ml   Net 2463 ml       Physical Examination  Constitutional: looks ill.  HENT: NCAT.  Oropharynx is clear and moist and mucous membranes are normal.   Eyes: Scleral icterus.  PERRL.    Neck: Neck supple.  Cardiovascular: Normal rate, regular rhythm, S1/S2 normal.  No murmurs or friction rub.  Pulmonary/Chest: No respiratory distress. CTAB.  No wheezes or rhonchi.   GI: Soft. +BS.  No distension, tenderness, or rebound. No organomegaly.  Extremities: No clubbing,  cyanosis, or edema.  Lymphadenopathy: No cervical LAD.  Neurological: A&Ox3.  Skin: Jaundiced.  Psychiatric: Normal mood and affect.     Data    CBC        12/31/21  0402 12/30/21  0405 12/29/21  0421   WBC 5.2 7.0 7.9   HGB 7.2* 7.8* 8.3*   HCT 21.9* 22.7* 24.6*   PLT 64* 74* 76*   NEUTA 4.78 6.43 7.30*   LYMA 0.22* 0.28* 0.25*   MOA 0.11* 0.16* 0.20   EOA 0.01 0.00 0.01   BASOA 0.00 0.00 0.01       Chem7        12/31/21  0402 12/30/21  1610 12/30/21  0405   NA 143 142 141   K 3.1* 3.5 2.7*   CL 115* 115* 111*   CO2 23 23 25    BUN 4* 4* 4*   CREAT 0.39* 0.37* 0.40*   GLU 78 97 91       Liver Panel        12/31/21  0402 12/30/21  0405 12/29/21  0421 12/11/21  0328 12/10/21  0208   AST 88* 88* 81*   < > 101*   ALT 64* 66* 62*   < > 116*   ALKP 485* 504* 513*   < > 745*   TBILI 13.1* 14.5* 14.6*   < > 14.6*   TP 3.3* 3.5* 3.6*   < > 3.2*   ALB 1.2* 1.2* 1.2*   < > 2.0*   GGT  --   --   --   --  1,160*    < > = values in this interval not displayed.       CRP        12/13/21  0302   CRP 11.3*       CMV PCR        12/30/21  0400   CMVQT 755*       Microbiology  2/15 CMV 755  2/12 GI viral panel PCR norovirus detected  2/10 C difficile toxin negative  2/8 CMV PCR 3,107  2/1 CSF   0 WBCs, normal protein, glucose low at 36.  HSV PCR neg  CMV PCR neg  2/1 C diff neg  2/1 GI PCR + norovirus  2/1 CMV PCR 179,555   1/30 UCx >100K C albicans  1/29 UCx >100K C albicans  1/29 cBCx x2 NG  1/29 CMV PCR 973,532  1/29 Hep E IgM neg  1/29 HBeAb neg  1/28 VZV PCR neg  1/28 HSV PCR neg  1/27 HAV IgG positive  1/27 EBV PCR neg  1/27 HBsAg neg, sAb 12  1/26 pBCx x2 NG  1/26 MRSA swab neg  1/26 C19 PCR neg  1/26 UCx >100K C [place]    OSH  1/18 GI PCR: +norovirus  1/18: flu, RSV, covid neg  1/22: CMV PCR positive  1/22: EBV PCR neg  1/24: blood cx x2: neg  1/25: VZV PCR: neg      Radiology Results  No results found.    I spoke with Dr. Dillon Bjork from [facility] regarding norovirus therapeutics.    Assessment  and Recommendations    Ariel Braun is a 61 y.o. female with MM s/p mel-auto 2012 on dara / pom / dex with subacute weakness and GI illness (abdominal pain, diarrhea) and encephalopathy since 07/2021 admitted 12/02/21 to [facility] for norovirus c/b hypovolemic shock transferred to [facility] 12/09/21.     Diarrhea could certainly be explained by CMV enterocolitis alone, but I am struck by the persistent norovirus PCR positivity in the face of ongoing watery diarrhea. Norovirus can likely explain the ongoing GI symptoms and is associated with prolonged shedding in hematopoietic stem cell and other immunocompromised (PMID: [id number], [id number]). Given low risk of nitazoxanide, would recommend treatment. Can aim for standard 3 days at 500mg  orally twice daily, then reassess then for potential longer treatment if there is a response.    Current ID issues:    # CMV viremia w/ colitis: High level to 440K, now down to 755 with GI involvement (flex sig w/ "rare small non-necrotizing mucosal granuloma and rare CMV immunolabeling"). Other work-up notable for liver bx CMV IHC neg, CSF PCR neg. On ganciclovir since 1/31 with VL downtrending to 755 2/15, diarrhea improved though with intermittent loose stools that may be multifactorial.    # Diarrhea with CT + enterocolitis: Initially multifactorial with norovirus and CMV involvement. Had improved but now recurred for which ddx includes infection (persistent norovirus, CMV though very little involvement on path). Repeat  GI panel still (unsurprisingly) positive for norovirus.    Dx  - Weekly CMV PCR until low level <300 x1    Rx  - Start nitazoxanide 500mg  orally twice daily. Plan for 3 days then reassess  - ContinueIV ganciclovir 5 mg/kg IV q12, planned duration 3 weeks and CMV PCR <300 x1  Transition to treatment-dose PO valganciclovir once diarrhea  resolved    We will continue to follow with you.  Please contact [facility] 1st Call via Voalte with  questions.    Cory Roughen, MD  12/31/2021    ADDENDUM 01/04/22  Diarrhea unchanged after 3 days of nitazoxanide  Recommend giving a full 7 days then rechecking norovirus PCR  If no response, may DC nitazoxanide then as diarrhea likely due to CMV enterocolitis mainly

## 2021-12-31 NOTE — Consults (Signed)
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 The patient has received VRd X 4 (achieving VGPR) then Mel 200 ASCT 05/12/11. 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 She met the criteria for PD on 02/19/2020 and was started on third line DARA PD in June 2021, which was recently held on 10/20/21 due to chronic diarrhea. Patient was transferred OSH after recent hypovolemic/distributive shock. Here for acute encephalopathy workup and acute liver failure workup.    Assessment and Treatment Plan    OT Progress summary: Pt continues to make slow progress and requires dependent assist with ADLs. Pt tolerated transfer to [facility] using hoyer lift and max A for pericare. Pt limited by impaired trunk control, strength and incontinence. Plan to focus next session on immediate transfer to [facility] as pt incontinence increases with mobility. Pt continues to be self limiting and impairing progress due to decreased participation. Recommend placement.  Progress with OT: Slow progress  Current maximal level of assist: Dependent assist  Next session focus:      Recommendations    Recommended Discharge Disposition Placement for continued therapy   Discharge DME Recommendations Defer to next level of care   Equipment Recommendations     DME Comment     Discharge Recommendations Comment     Rehab Potential Patient participates well in therapy and is progressing towards goal   Anticipated Assistance Available at Discharge Family;Spouse/Significant other   Anticipated Type of Assistance Available at Discharge Assist as needed   Anticipated Time of Assistance Available at Discharge Unknown at this time   Barriers to Discharge Medical issues;Impaired Cognition;Insufficient activity tolerance;Behavioral issues;Insufficient physical ability   Patient's current functional ability appropriate for D/C recommendations No      Inpatient Recommendations  OT Inpatient Recommendations     Recommendation Comments Sling to chair and 2p assist   Current Maximal Level of Assist Needed Dependent assist     Brace/Precautions   Brace/Orthotic/Prosthetic:No  Precautions:Has weight bearing limitation or precaution: Yes  Precautions and weight bearing status comments: delrium, falls, skin     Subjective Report:"I need to go the bathroom"    Patient/Family Goal:     Objective Findings and Interventions    Areas of Occupation    Grooming and Light Hygiene     Cueing Required     Comment     Self Feeding     Cueing Required     Comment     Toileting Maximal assistance   Cueing Required     Toileting Clothing Management Dependent   Cueing Required     Comment Hoyer lift to [facility], posteriorly leaning, mod A to anterior weight shift unable to sustain upright sit. Max A pericare   Upper Body Dressing     Cueing Required     Comment     Lower Body Dressing     Cueing Required     Comment     Upper Body Bathing     Cueing Required     Comment     Lower Body Bathing     Cueing Required     Comment     ADL/IADL Comment         Functional Transfers    Bed Mobility From Supine;Sidelying   Bed Mobility To Sidelying;Supine   Level of Assist Maximal assistance   Cueing Required  Transfer From Bed   Transfer To Chair   Level of Assist Dependent   Cueing Required     Technique     Functional Transfer Comment Hoyer lift transfer to chair, reported need to use restroom, dependent transfer to [facility]. Max A pericare, transfer back to chair. Pt left upright, posey belt donned, setup for lunch.     Functional Balance for ADLs  Position Static Dynamic   Sit Static sitting level of assist: Maximal assist-      -        Stand  -      -            KB Home	[place] AM-PAC for ADLs  6 Click Score: 11    Client Factors                                                 OT Education  Education  Learner: Patient  Content: Plan of care;Activity recommendations  Response: Needs  reinforcement  Communication between other health care provider: OT;RN    OT Plan of [place], OT  12/31/2021 2:23 PM

## 2021-12-31 NOTE — Plan of Care (Signed)
Pt continues t struggle w frequent BM's

## 2021-12-31 NOTE — Consults (Signed)
DOS: 12/31/21     NUTRITION SERVICES ADULT COMPREHENSIVE SCREEN    Patient History:    Ariel Braun is a 61 y.o. female admitted for Multiple myeloma (CMS code) [C90.00].    Anthropometrics:   Height: 153 cm (5' 0.24")  Ideal Body Weight: 45.99 kg    Usual body weight: 90.91 kg (verified by pt's daughter)  Admit weight: 79.6 kg (175 lb 7.8 oz) (12/09/21 2356) (Scale type: Bed scale (zeroed prior to use)  Current weight:  (Unable to zero bed @ this time. Large difference in wt w/wt from day before) (12/31/21 0400) (Scale type:  )    Calculation Weight: 79.6 kg (Percent IBW (calculated) (%): 173)  Calc weight BMI: 34 kg/m^2    Wt Readings from Last 10 Encounters:   12/29/21 85.6 kg (188 lb 11.4 oz)   12/12/18 96.3 kg (212 lb 4.9 oz)   08/08/18 98.9 kg (218 lb 1.6 oz)   05/23/18 98.7 kg (217 lb 9.6 oz)   04/11/18 98.5 kg (217 lb 1.6 oz)   01/10/18 98.4 kg (217 lb)   09/16/17 96.6 kg (213 lb)   06/21/17 96.4 kg (212 lb 9.6 oz)   03/15/17 95.6 kg (210 lb 12.8 oz)   12/14/16 94.7 kg (208 lb 12.8 oz)       Total weight change: -4.6 kg (-5.1 %)  Time frame for weight change: 1 week    Assessment of Weight Change: Severe weight loss       Nutrition-focused physical findings:   GI Findings and Assessment:        GI: Appetite, decrease, Diarrhea, Nausea     Lines, Drains, Airway, Wounds:   Patient Lines/Drains/Airways Status     Active LDAs     Name Placement date Placement time Site Days    PICC Double Lumen 12/10/21 Power Central Left;Upper Arm 1:Purple 2:Red 7 cm 12/10/21  1534  Arm  20    Indwelling Urinary Catheter 12/19/21 16 12/19/21  0550  --  12    Wound 12/19/21 Skin tear Sacrum 12/19/21  1900  Sacrum  11                In-House Nutrition Orders:  Regular Diet       Food and Nutrition Intake:   Information obtained from: Family (Daughter)    Oral Food and Fluid Intake  Baseline Modified Diet: None  Meal/Snack Pattern: 2 meals per day  Meal Intake Percentage: 50-75% of meals  Poor Meal Intake Assessment Period:  greater than or equal to 7 days  Diet Recall Details: For the past 2 days pt had been eating more solid foods, like tuna fish sandwich and cupcakes. Previous days pt usually eat berry banana smoothie, apple sauce, and juice. Pt drinks Boost+ vanilla, unclear how many times/day.      Knowledge, Beliefs, Attitudes  Readiness to Change Nutrition-Related Behavior Stage: Preparation (Daughter)      Nutrition Interventions and Plan of Care:  Oral Nutrition:       Recommend Feeding Assistance: Provided menu selection assistance        Oral Nutrition Supplements and/or Food Fortification  Recommendation for supplements to patient: Continue oral nutrition supplements  Type of supplemental nourishment: Oral nutrition supplement  Supplement name and nutrient provision: Boost Plus vanilla, [employer] [place] & Beneprotein sent for trial  Supplement frequency recommendation: 2x daily     Nutrition Education:   Nutrition Education Type : Education on nutrition's influence on health   Talked with  family over the phone to review the following:    Nausea and/or vomiting:   Eat small frequent meals of bland foods   Eat before hunger occurs   Remain upright after meals   Take anti-nausea meds 30 minutes before mealtimes   Try cold/room temperature, low-aroma foods   Engage in light exercise, as able/indicated     Diarrhea:    Try a low fat, low-insoluble fiber/high soluble fiber diet    Use fiber supplements, as indicated (e.g. nutrisource fiber)    Avoid greasy, spicy foods    Avoid gas-forming foods (beans, onions, carbonation, chewing gum)    Avoid excessive juice and soda    Avoid sugar alcohols such as sorbitol or malitol    Drink plenty of fluids to prevent dehydration    Increase intake of potassium-rich foods, as indicated, like potatoes and bananas    Monitor tolerance to lactose-containing products to r/o chemo-related lactose intolerance     Materials Provided: Diet for Side Effects of Cancer Therapies     Readiness  to change nutrition related behavior: Readiness to Change Nutrition-Related Behavior Stage: Preparation   Barriers to learning: No Barriers   Stated preference to learning: Unable to assess   Method of teaching: Explanation      Nutrition Monitoring/Evaluation:  Nutrition Goal Indicator: Food intake  Nutrition Goal Criteria: Intake of greater than or equal to 75% of 3 meals per day  Nutrition Goal Progress: Some progress toward goal  Nutrition Goal 2 Indicator: Food intake  Nutrition Goal 2 Criteria: Intake of 1-3 oral nutrition supplements per day  Nutrition Goal 2 Progress: Some progress toward goal    [place], DTR

## 2021-12-31 NOTE — Progress Notes (Signed)
[facility] PROGRESS NOTE  ATTENDING ONLY     My date of service is 12/31/21.    24 Hour Course   Tested positive for norovirus.    Subjective  Sound asleep. On re-eval, arousable but no new complaints besides diarrhea.    Vitals  Temp:  [36.4 C (97.5 F)-36.9 C (98.4 F)] 36.7 C (98.1 F)  Heart Rate:  [75-96] 78  *Resp:  [16-18] 18  BP: (92-111)/(65-79) 111/79  SpO2:  [95 %-96 %] 96 %    MostRecent Weight:  (Unable to zero bed @ this time. Large difference in wt w/wt from day before)  Admission Weight: 79.6 kg (175 lb 7.8 oz)      Intake/Output Summary (Last 24 hours) at 12/31/2021 1619  Last data filed at 12/31/2021 1430  Gross per 24 hour   Intake 4333 ml   Output 1750 ml   Net 2583 ml     Pain Score: Eyes closed, patient calm    Physical Exam  Vitals reviewed.   Constitutional:       Appearance: She is ill-appearing.   HENT:      Head: Normocephalic and atraumatic.      Nose: Nose normal.      Mouth/Throat:      Mouth: Mucous membranes are moist.      Pharynx: Oropharynx is clear.   Eyes:      General: Scleral icterus present.      Extraocular Movements: Extraocular movements intact.      Pupils: Pupils are equal, round, and reactive to light.   Cardiovascular:      Rate and Rhythm: Normal rate and regular rhythm.      Pulses: Normal pulses.      Heart sounds: Normal heart sounds.   Pulmonary:      Effort: Pulmonary effort is normal.      Breath sounds: Normal breath sounds.   Abdominal:      General: Abdomen is flat. Bowel sounds are normal.      Palpations: Abdomen is soft.   Musculoskeletal:         General: No swelling. Normal range of motion.      Cervical back: Normal range of motion and neck supple.      Right lower leg: No edema.      Left lower leg: No edema.   Skin:     General: Skin is warm and dry.      Capillary Refill: Capillary refill takes less than 2 seconds.      Coloration: Skin is jaundiced.   Neurological:      General: No focal deficit present.      Mental Status: She  is alert and oriented to person, place, and time.      Comments: Slow to respond   Psychiatric:         Mood and Affect: Mood normal.         Speech: Speech is delayed.         Behavior: Behavior is slowed.     Scheduled Meds:   0.9% sodium chloride flush  3 mL Intravenous Q12H [place]    clotrimazole   Topical BID SCH    dicyclomine  20 mg Oral 4x Daily [place]    dry mouth oral rinse  5 mL Mucous Membrane 4x Daily [place]    ergocalciferol (vitamin D2)  50,000 Units Oral Q7 Days [place]    ganciclovir (CYTOVENE) IVPB  5 mg/kg (Dosing Weight) Intravenous Q12H [facility]  multivitamin complete chewable  1 tablet Oral Daily [place]    nitazoxanide  500 mg Oral Q12H [place]    QUEtiapine  50 mg Oral Once    rifAXIMin  550 mg Oral TID [facility]    thiamine  200 mg Intravenous Q24H    ursodioL  600 mg Oral BID [place]    vitamin E (dl, acetate)  800 Units Oral Daily [place]     Continuous Infusions:   dextrose 5 % and 0.9 % sodium chloride 75 mL/hr (12/31/21 0402)     PRN Meds:   0.9% sodium chloride flush  3 mL Intravenous PRN    albuterol  2.5 mg Nebulization Q4H PRN    Or    albuterol  2.5 mg Nebulization Q4H PRN    diphenhydrAMINE  50 mg Intravenous Once PRN    EPINEPHrine  0.3 mg Intramuscular Once PRN    hydrocortisone  100 mg Intravenous Once PRN    HYDROmorphone  0.3-0.8 mg Intravenous Q4H PRN    loperamide  4 mg Oral 4x Daily PRN    LORazepam  0.5 mg Oral Once PRN    magnesium sulfate in dextrose 5 %  2-4 g Intravenous Daily PRN    Or    magnesium sulfate in water  2-4 g Intravenous Daily PRN    ondansetron  8 mg Intravenous Q8H PRN    polyethylene glycol  17 g Oral Daily PRN    potassium chloride in sterile water  20-80 mEq Intravenous Daily PRN    Or    potassium chloride in sterile water  20-80 mEq Intravenous Daily PRN    Or    potassium chloride  20-80 mEq Oral Daily PRN     Data    CBC        12/31/21  0402   WBC 5.2   HGB 7.2*   HCT 21.9*   PLT 64*     Coags        12/31/21  0402   INR 1.3*     Chem7         12/31/21  0402 12/30/21  1610   NA 143 142   K 3.1* 3.5   CL 115* 115*   CO2 23 23   BUN 4* 4*   CREAT 0.39* 0.37*   GLU 78 97     Electrolytes        12/31/21  0402 12/30/21  1610   CA 7.4* 7.6*   MG 2.1  --      Liver Panel        12/31/21  0402   AST 88*   ALT 64*   ALKP 485*   TBILI 13.1*   TP 3.3*   ALB 1.2*     Lactate  No results found in last 36 hours    Microbiology Results (last 24 hours)     ** No results found for the last 24 hours. **        Radiology Results  No results found.     I discussed the patient with Kari Baars, MD from [place] regarding A&P.    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 The patient has received VRd X 4 (achieving VGPR) then Mel 200 ASCT 05/12/11. 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 She met the criteria for PD on 02/19/2020 and was started on third line  DARA PD in June 2021, which was recently held on 10/20/21 due to chronic diarrhea. Patient was transferred OSH after recent hypovolemic/distributive shock. Here for acute encephalopathy workup and acute liver failure workup.      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   June 2020 due to progression of M spike Revlimid was increased to 15 mg PO D1-21 every 28 days, plus dexamethasone  July 2021: started daratumumab, Pomylast 2 mg D 1-21, Q28 days, dexamethasone 12 mg PO Q weekly.   Primary oncologist: Dr. Iona Coach,  Dr. Kennon Rounds [place] [facility]  Receiving dexamethasone 12 mg weekly for diarrhea  Receiving MT with daratumumab with pomalyst and dex, most recent dose 10/16/21, has since been on hold due to worsening diarrhea.     DATA:   09/07/2010: IgG Kappa MM  12/02/2021: Ig M <5, IgG 447, IgA 20, Ig E <2, SFLC Kappa 16.6,  Lambda 1.6, Kappa/Lamda 10.38  12/14/21: IgG K on IFE, IgG 430, KLC 16.6, LLC 2.7, KLR 6.15    Chemo:   Holding Daratumumab and Pomalyst, last dose 10/20/21, given recent infection and acute liver failure.    Supportive Care:   Transfusion: Keep Hg >7, and Platelets >10.   Per daughter, has autoantibodies in blood was a difficult match. Would like to be contacted prior to transfusion.   Line: PICC placed 1/26  Outpatient Oncologist: Dr. Kennon Rounds    Dispo: TBD after resolution of diarrhea and liver failure, likely transfer back to OSH once stabilized    # Immunocompromised  # CMV colitis  # At risk for infections due to malignancy  # Shock, likely hypovolemic vs distributive in the setting of chronic diarrhea, + norovirus infection   # Acute on chronic diarrhea, norovirus positive., diff and OP negative.   -No prior evidence of adrenal insufficiency, AM cortisol levels normal, previously on dexamethasone 8 mg PO Qweekly, but has been held since early December.    Data:  - CT brain: No acute intracranial hemorrhage, herniation, or hydrocephalus.  - CT chest/A/P pending: Medium-sized bilateral pleural effusions with bilateral peribronchovascular groundglass opacities, suggestive of pulmonary edema. Clustered nodules in right middle lobe along the right minor fissure, which could represent mild aspiration or infection. Numerous sclerotic and mixed lytic/sclerotic lesions throughout the thoracic spine and bilateral ribs, increased in size and number compared to prior. Appearance is atypical for multiple myeloma but may represent a combination of treated, partially treated, and active disease. No acute pathologic fracture. Findings compatible with enterocolitis of infectious or inflammatory etiology. Involvement of the colon is greater than of the small bowel. Marked hepatic steatosis. Increased sclerosis of bone lesions throughout the thoracolumbar spine, bony pelvis, and proximal left femur. Appearance is atypical  for multiple myeloma but may represent a combination of treated, partially treated, and active disease. No acute pathologic fracture.  - 1/29:CMV PCR [id number]  - 1/31: GI PCR panel positive only for Norovirus.   2/1: LP results: Neutrophils 6% in tube 1, Lymphocytes 88% (predominant), protein 42 (normal), glucose 36 (low), rapid HSV in CSF negative. CMV CSF PCR negative    - 2/2 CMV PCR 179,555High   - 2/2 Flex Sig Biopsies prelim: "colonic mucosa with a rare small non-necrotizing mucosal granuloma and rare CMV immunolabeling"   - 2/5 [place] work  up: sIL2 (1556.6 high), ferritin (948 high), TG(258 high), NKA-IFN? 10 (WNL)  - 2/8: CMV PCR: 3107 (sig improvement)  - 2/10: C diff: negative.  - 2/12: Repeat GI viral PCR: positive for Norovirus  - 2/15 CMV 755    Plan:  - appreciate ID reccs   - Ganciclovir 5 mg/kg (1/31-). S/p one dose of IVIG on 2/7. Started on Alinia (nitazoxanide) for 3 days (2/16 - 2/18)   - GI consult: appreciate reccs: flex sig 2/2   - stop zosyn: s/p IV Zosyn 4.5 g Q8hrs (1/26-1/28, restarted 1/29 given recurrent fever-12/16/21)  - Weekly CMV  - Rifaximin dose adjusted to cover for SIBO  - OI ppx: Acyclovir IV    # Grade III neutropenia, at baseline  # Leukopenia  #Thrombocytopenia (possibly 2/2 liver disease)  Data: WBC of 2.2 on 12/05/21, on Zarxio at home  Plan: Continue to monitor    # Acute liver injury of unclear etiology  # Acute on chronic encephalopathy likely 2/2 infectious vs hepatic vs metabolic  # Hyperbilirubinemia  # Anasarca  # Hypoalbuminemia    Data:   -Steady rise in LFTs in 05/2021,  -12/02/21: AST 152--> 121, ALT 130--> 110, TB 5.4, Albumin 1.1  -Hepatitis panel negative.   -1/26:  Tbili 14.6, GGT 1160, ammonia 25  -CTAP 12/02/21: Scattered sclerotic lesions in the lumbar spine, hepatic steatosis.   -RUQ ultrasound: liver enlarged with diffuse increased echogenicity, consistent with fatty infiltration. 2cm stone in the gallbladder.   -HIDA scan: Normal radiotracer filling of  the gallbladder is identified, therefore the exam is not consistent with cystic duct obstruction or acute cholecystitis.  - [place] abd 1/27: Hepatomegaly with diffusely increased echogenicity which can be seen with hepatic amyloidosis or steatosis. Please refer to the recently performed biopsy for further details. Mild perihepatic ascites. Patent hepatic vasculature with normal waveforms. Cholelithiasis without acute cholecystitis. Right pleural effusion.  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 CMV stains: Negative.  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 Could be drug related. Increase Ursodiol to 600 mg BID, Check Hepatitis E. 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 Unlikely diarrhea from Ursodiol.                         - Consider avoiding steroid    - Daily LFT/INR  - RD consult placed to expand diet to more than protein shakes.  -- Family requesting appetite stimulant but risk > benefit.     # Acute toxic encephalopathy, improving  In the setting of liver failure, ammonia only 25. LP performed 2/1 to r/o encephalitis.     Data:   1/31: HIV, TSH, Folate, B12 WNL  1/30: CT head WNL other than sinus disease  2/1: LP results: Neutrophils 6% in tube 1, Lymphocytes  88% (predominant),  protein 42 (normal), glucose 36 (low), rapid HSV in CSF negative. CMV CSF PCR negative  2/5: Brain MRI: Smooth diffuse dural thickening and enhancement as well as bilateral holohemispheric subdural effusions, which may be on the basis of diminished intracranial pressure following recent lumbar puncture.  -2/8: EEG: non-specific mild cerebral dysfunction.  -2/9: CT Brain: similar to prior MRI with hyperdense areas likely hygromas    Plan:  - Neurology consult: 2/7: Encephalopathy likely from underlying medical disease. Delirium prevention. Avoid Ativan and try Seroquel for delirium. MRI findings is from LP. Okay to try caffiene tablets in the daytime to help with postural headache but patient is allergic to it.  -2/8: Repeat CT head to monitor the status of subdural effusion: stable, EEG: diffuse slowing, Thiamine iv prophylaxis.  -Speech path: Regular and thin liquid. Slightly higher risk for aspiration given encephalopathy.  - Start empiric rifaximin given possibility of HE without elevated ammonia  -- dose adjusted as above to cover for SIBO  -- holding off on lactulose given diarrhea    # Deconditioning  Pt has not been engaging much with PT/OT despite improved mental status. May be due in part to anxieties about diarrhea/incontinence.  - continue PT/OT  - continue encouragement to participate, particular by family/familiar faces    # Hypokalemia:  # Hypomagnesemia:  Replete as needed.    # Vitamin D deficiency:  -Supplements ordered.    # Moderate malnutrition:  -Seen by RD and given recs for supplements.    # History of PE, diagnosed in April 2019  - hold home [place] for now given recent anemia and concerns for bleeding.   - monitor for signs and symptoms of worsening bleeding.     # Elevated TSH at OSH, likely 2/2 subclinical hypothyroidism  -TSH WNL on 12/15/20.      # Anxiety, emotional distress:   -Patient has stressful social situation, intellectually disabled son, and regarding underlying disease  process.     # Chronic buttocks pressure wound  - Wound care consult, appreciate recs  - Position changes as tolerated, waffle cushion, etc  -- Pressure wound ruled out, treating bilateral buttocks moisture and friction damage     #VTE PPx:  Contraindicated: Other: anticipate Plt <50    Severity of Illness  Stable     During this hospitalization the patient is also being treated for:  - Sepsis (present on admission)    - Malignancy associated fatigue on admission  - Neoplasm/malignancy associated pain  - Hypoalbuminemia (present on admission)  - Fluid status: Hypovolemia on admission     - Cachexia  - Encephalopathy: Metabolic encephalopathy     Code Status: FULL    [name], MD  12/31/21

## 2022-01-01 LAB — COMPREHENSIVE METABOLIC PANEL (BMP, AST, ALT, T.BILI, ALKP, TP ALB)
AST: 100 U/L — ABNORMAL HIGH (ref 5–44)
Alanine transaminase: 71 U/L — ABNORMAL HIGH (ref 10–61)
Albumin, Serum / Plasma: 1.2 g/dL — ABNORMAL LOW (ref 3.4–4.8)
Alkaline Phosphatase: 522 U/L — ABNORMAL HIGH (ref 38–108)
Anion Gap: 4 (ref 4–14)
Bilirubin, Total: 13.2 mg/dL — ABNORMAL HIGH (ref 0.2–1.2)
Calcium, total, Serum / Plasma: 7.5 mg/dL — ABNORMAL LOW (ref 8.4–10.5)
Carbon Dioxide, Total: 23 mmol/L (ref 22–29)
Chloride, Serum / Plasma: 114 mmol/L — ABNORMAL HIGH (ref 101–110)
Creatinine: 0.39 mg/dL — ABNORMAL LOW (ref 0.55–1.02)
Glucose, non-fasting: 83 mg/dL (ref 70–199)
Potassium, Serum / Plasma: 3.4 mmol/L — ABNORMAL LOW (ref 3.5–5.0)
Protein, Total, Serum / Plasma: 3.4 g/dL — ABNORMAL LOW (ref 6.3–8.6)
Sodium, Serum / Plasma: 141 mmol/L (ref 135–145)
Urea Nitrogen, Serum / Plasma: 4 mg/dL — ABNORMAL LOW (ref 7–25)
eGFRcr: 113 mL/min/{1.73_m2} (ref >59)

## 2022-01-01 LAB — BASIC METABOLIC PANEL (NA, K, CL, CO2, BUN, CR, GLU, CA)
Anion Gap: 5 (ref 4–14)
Calcium, total, Serum / Plasma: 7.3 mg/dL — ABNORMAL LOW (ref 8.4–10.5)
Carbon Dioxide, Total: 22 mmol/L (ref 22–29)
Chloride, Serum / Plasma: 114 mmol/L — ABNORMAL HIGH (ref 101–110)
Creatinine: 0.38 mg/dL — ABNORMAL LOW (ref 0.55–1.02)
Glucose, non-fasting: 83 mg/dL (ref 70–199)
Potassium, Serum / Plasma: 3.4 mmol/L — ABNORMAL LOW (ref 3.5–5.0)
Sodium, Serum / Plasma: 141 mmol/L (ref 135–145)
Urea Nitrogen, Serum / Plasma: 4 mg/dL — ABNORMAL LOW (ref 7–25)
eGFRcr: 114 mL/min/{1.73_m2} (ref >59)

## 2022-01-01 LAB — COMPLETE BLOOD COUNT WITH DIFFERENTIAL
Abs Basophils: 0 10*9/L (ref 0.00–0.10)
Abs Eosinophils: 0 10*9/L (ref 0.00–0.40)
Abs Imm Granulocytes: 0.06 10*9/L (ref <0.10)
Abs Lymphocytes: 0.24 10*9/L — ABNORMAL LOW (ref 1.00–3.40)
Abs Monocytes: 0.1 10*9/L — ABNORMAL LOW (ref 0.20–0.80)
Abs Neutrophils: 4.28 10*9/L (ref 1.80–6.80)
Hematocrit: 22.7 % — ABNORMAL LOW (ref 36.0–46.0)
Hemoglobin: 7.3 g/dL — ABNORMAL LOW (ref 12.0–15.5)
MCH: 35.8 pg — ABNORMAL HIGH (ref 26.0–34.0)
MCHC: 32.2 g/dL (ref 31.0–36.0)
MCV: 111 fL — ABNORMAL HIGH (ref 80–100)
MPV: 13.5 fL — ABNORMAL HIGH (ref 9.1–12.6)
Platelet Count: 63 10*9/L — ABNORMAL LOW (ref 140–450)
RBC Count: 2.04 10*12/L — ABNORMAL LOW (ref 4.00–5.20)
RDW-CV: 24.9 % — ABNORMAL HIGH (ref 11.7–14.4)
WBC Count: 4.7 10*9/L (ref 3.4–10.0)

## 2022-01-01 LAB — PROTHROMBIN TIME
INR: 1.3 — ABNORMAL HIGH (ref 0.9–1.2)
PT: 15.5 s — ABNORMAL HIGH (ref 11.6–15.0)

## 2022-01-01 LAB — BILIRUBIN, DIRECT: Bilirubin, Direct: 9.5 mg/dL — ABNORMAL HIGH (ref <0.6)

## 2022-01-01 LAB — MAGNESIUM, SERUM / PLASMA: Magnesium, Serum / Plasma: 1.8 mg/dL (ref 1.6–2.6)

## 2022-01-01 LAB — FIBRINOGEN, FUNCTIONAL: Fibrinogen, Functional: 159 mg/dL — ABNORMAL LOW (ref 202–430)

## 2022-01-01 MED ORDER — POTASSIUM CHLORIDE 40 MEQ/100ML IN STERILE WATER INTRAVENOUS PIGGYBACK
40 | Freq: Once | INTRAVENOUS | Status: AC
Start: 2022-01-01 — End: 2022-01-01
  Administered 2022-01-01: 17:00:00 via INTRAVENOUS

## 2022-01-01 MED ORDER — MAGNESIUM SULFATE 2 GRAM/50 ML (4 %) IN WATER INTRAVENOUS PIGGYBACK
2 | Freq: Once | INTRAVENOUS | Status: AC
Start: 2022-01-01 — End: 2022-01-01
  Administered 2022-01-01: 17:00:00 2 g via INTRAVENOUS

## 2022-01-01 MED ORDER — MAGNESIUM SULFATE 2 GRAM/50 ML (4 %) IN WATER INTRAVENOUS PIGGYBACK
2 | Freq: Once | INTRAVENOUS | Status: DC
Start: 2022-01-01 — End: 2022-01-01

## 2022-01-01 MED ORDER — ELECTROLYTE-A IV BOLUS
Freq: Once | INTRAVENOUS | Status: AC
Start: 2022-01-01 — End: 2022-01-01
  Administered 2022-01-01: 18:00:00 1000 mL via INTRAVENOUS

## 2022-01-01 MED ORDER — POTASSIUM CHLORIDE 40 MEQ/100ML IN STERILE WATER INTRAVENOUS PIGGYBACK
40100 mEq/100 mL | Freq: Once | INTRAVENOUS | Status: DC
Start: 2022-01-01 — End: 2022-01-03

## 2022-01-01 MED ORDER — POTASSIUM CHLORIDE 40 MEQ/100ML IN STERILE WATER INTRAVENOUS PIGGYBACK
40 | Freq: Once | INTRAVENOUS | Status: AC
Start: 2022-01-01 — End: 2022-01-01
  Administered 2022-01-02: 06:00:00 via INTRAVENOUS

## 2022-01-01 MED FILL — ANTI-DIARRHEAL (LOPERAMIDE) 2 MG TABLET: 2 mg | ORAL | Qty: 2

## 2022-01-01 MED FILL — URSODIOL 300 MG CAPSULE: 300 mg | ORAL | Qty: 2

## 2022-01-01 MED FILL — XIFAXAN 550 MG TABLET: 550 mg | ORAL | Qty: 1

## 2022-01-01 MED FILL — DICYCLOMINE 10 MG CAPSULE: 10 mg | ORAL | Qty: 2

## 2022-01-01 MED FILL — MAGNESIUM SULFATE 2 GRAM/50 ML (4 %) IN WATER INTRAVENOUS PIGGYBACK: 2 gram/50 mL (4 %) | INTRAVENOUS | Qty: 50

## 2022-01-01 MED FILL — POTASSIUM CHLORIDE 40 MEQ/100ML IN STERILE WATER INTRAVENOUS PIGGYBACK: 40 mEq/100 mL | INTRAVENOUS | Qty: 100

## 2022-01-01 MED FILL — GANCICLOVIR SODIUM 500 MG INTRAVENOUS SOLUTION: 500 mg | INTRAVENOUS | Qty: 8

## 2022-01-01 MED FILL — THIAMINE HCL (VITAMIN B1) 100 MG/ML INJECTION SOLUTION: 100 mg/mL | INTRAMUSCULAR | Qty: 2

## 2022-01-01 MED FILL — VITAMIN E (DL, ACETATE) 45 MG (100 UNIT) CAPSULE: 45 mg (100 unit) | ORAL | Qty: 8

## 2022-01-01 MED FILL — FLINTSTONES COMPLETE (IRON) CHEWABLE TABLET: ORAL | Qty: 1

## 2022-01-01 MED FILL — ALINIA 100 MG/5 ML ORAL SUSPENSION: 100 mg/5 mL | ORAL | Qty: 25

## 2022-01-01 NOTE — Progress Notes (Signed)
[facility] PROGRESS NOTE  ATTENDING ONLY     My date of service is 01/01/22.    24 Hour Course  6:17 AM  K 3.4 repleted 40 meq IV,  Mg 1.8 repleted 2 grams of IV mg.     Subjective  Diarrhea persists. Still not excited about mobilizing.    Vitals  Temp:  [36.4 C (97.5 F)-36.7 C (98.1 F)] 36.7 C (98.1 F)  Heart Rate:  [63-80] 63  *Resp:  [16-18] 16  BP: (84-109)/(54-68) 96/61  SpO2:  [95 %-98 %] 97 %    MostRecent Weight:  (Unable to zero bed @ this time. Large difference in wt w/wt from day before)  Admission Weight: 79.6 kg (175 lb 7.8 oz)      Intake/Output Summary (Last 24 hours) at 01/01/2022 1639  Last data filed at 01/01/2022 1200  Gross per 24 hour   Intake 2938 ml   Output 2050 ml   Net 888 ml     Pain Score: Eyes closed, patient calm    Physical Exam  Vitals reviewed.   Constitutional:       Appearance: She is ill-appearing.   HENT:      Head: Normocephalic and atraumatic.      Nose: Nose normal.      Mouth/Throat:      Mouth: Mucous membranes are moist.      Pharynx: Oropharynx is clear.   Eyes:      General: Scleral icterus present.      Extraocular Movements: Extraocular movements intact.      Pupils: Pupils are equal, round, and reactive to light.   Cardiovascular:      Rate and Rhythm: Normal rate and regular rhythm.      Pulses: Normal pulses.      Heart sounds: Normal heart sounds.   Pulmonary:      Effort: Pulmonary effort is normal.      Breath sounds: Normal breath sounds.   Abdominal:      General: Abdomen is flat. Bowel sounds are normal.      Palpations: Abdomen is soft.   Musculoskeletal:         General: No swelling. Normal range of motion.      Cervical back: Normal range of motion and neck supple.      Right lower leg: No edema.      Left lower leg: No edema.   Skin:     General: Skin is warm and dry.      Capillary Refill: Capillary refill takes less than 2 seconds.      Coloration: Skin is jaundiced.   Neurological:      General: No focal deficit present.       Mental Status: She is alert and oriented to person, place, and time.      Comments: Slow to respond   Psychiatric:         Mood and Affect: Mood normal.         Speech: Speech is delayed.         Behavior: Behavior is slowed.     Scheduled Meds:   0.9% sodium chloride flush  3 mL Intravenous Q12H [place]    clotrimazole   Topical BID SCH    dicyclomine  20 mg Oral 4x Daily [place]    dry mouth oral rinse  5 mL Mucous Membrane 4x Daily [place]    ergocalciferol (vitamin D2)  50,000 Units Oral Q7 Days [place]    ganciclovir (CYTOVENE)  IVPB  5 mg/kg (Dosing Weight) Intravenous Q12H [place]    multivitamin complete chewable  1 tablet Oral Daily [place]    nitazoxanide  500 mg Oral Q12H [place]    QUEtiapine  50 mg Oral Once    rifAXIMin  550 mg Oral TID [facility]    thiamine  200 mg Intravenous Q24H    ursodioL  600 mg Oral BID [place]    vitamin E (dl, acetate)  800 Units Oral Daily [place]     Continuous Infusions:   dextrose 5 % and 0.9 % sodium chloride 75 mL/hr (12/31/21 1819)     PRN Meds:   0.9% sodium chloride flush  3 mL Intravenous PRN    albuterol  2.5 mg Nebulization Q4H PRN    Or    albuterol  2.5 mg Nebulization Q4H PRN    HYDROmorphone  0.3-0.8 mg Intravenous Q4H PRN    loperamide  4 mg Oral 4x Daily PRN    magnesium sulfate in dextrose 5 %  2-4 g Intravenous Daily PRN    Or    magnesium sulfate in water  2-4 g Intravenous Daily PRN    ondansetron  8 mg Intravenous Q8H PRN    polyethylene glycol  17 g Oral Daily PRN    potassium chloride in sterile water  20-80 mEq Intravenous Daily PRN    Or    potassium chloride in sterile water  20-80 mEq Intravenous Daily PRN    Or    potassium chloride  20-80 mEq Oral Daily PRN     Data    CBC        01/01/22  0334   WBC 4.7   HGB 7.3*   HCT 22.7*   PLT 63*     Coags        01/01/22  0334   INR 1.3*     Chem7        01/01/22  0334 12/31/21  1609   NA 141 141   K 3.4* 3.4*   CL 114* 114*   CO2 23 22   BUN 4* 4*   CREAT 0.39* 0.38*   GLU 83 83     Electrolytes        01/01/22  0334  12/31/21  1609   CA 7.5* 7.3*   MG 1.8  --      Liver Panel        01/01/22  0334   AST 100*   ALT 71*   ALKP 522*   TBILI 13.2*   TP 3.4*   ALB 1.2*     Lactate  No results found in last 36 hours    Microbiology Results (last 24 hours)     ** No results found for the last 24 hours. **        Radiology Results  No results found.     I discussed the patient with Kari Baars, MD from [facility] regarding A&P.    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 The patient has received VRd X 4 (achieving VGPR) then Mel 200 ASCT 05/12/11. 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 She met the criteria for PD on 02/19/2020 and was started on third line DARA PD in June 2021, which was recently held on 10/20/21 due to chronic diarrhea. Patient was transferred OSH after recent hypovolemic/distributive shock. Here for acute encephalopathy workup  and acute liver failure workup.      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   June 2020 due to progression of M spike Revlimid was increased to 15 mg PO D1-21 every 28 days, plus dexamethasone  July 2021: started daratumumab, Pomylast 2 mg D 1-21, Q28 days, dexamethasone 12 mg PO Q weekly.   Primary oncologist: Dr. Iona Coach,  Dr. Kennon Rounds [place] [facility]  Receiving dexamethasone 12 mg weekly for diarrhea  Receiving MT with daratumumab with pomalyst and dex, most recent dose 10/16/21, has since been on hold due to worsening diarrhea.     DATA:   09/07/2010: IgG Kappa MM  12/02/2021: Ig M <5, IgG 447, IgA 20, Ig E <2, SFLC Kappa 16.6, Lambda 1.6, Kappa/Lamda 10.38  12/14/21: IgG K on IFE, IgG 430, KLC 16.6, LLC 2.7, KLR 6.15    Chemo:   Holding Daratumumab and Pomalyst, last dose 10/20/21, given  recent infection and acute liver failure.    Supportive Care:   Transfusion: Keep Hg >7, and Platelets >10.   Per daughter, has autoantibodies in blood was a difficult match. Would like to be contacted prior to transfusion.   Line: PICC placed 1/26  Outpatient Oncologist: Dr. Kennon Rounds    Dispo: TBD after resolution of diarrhea and liver failure, likely transfer back to OSH once stabilized    # Immunocompromised  # CMV colitis  # At risk for infections due to malignancy  # Shock, likely hypovolemic vs distributive in the setting of chronic diarrhea, + norovirus infection   # Acute on chronic diarrhea, norovirus positive., diff and OP negative.   -No prior evidence of adrenal insufficiency, AM cortisol levels normal, previously on dexamethasone 8 mg PO Qweekly, but has been held since early December.    Data:  - CT brain: No acute intracranial hemorrhage, herniation, or hydrocephalus.  - CT chest/A/P pending: Medium-sized bilateral pleural effusions with bilateral peribronchovascular groundglass opacities, suggestive of pulmonary edema. Clustered nodules in right middle lobe along the right minor fissure, which could represent mild aspiration or infection. Numerous sclerotic and mixed lytic/sclerotic lesions throughout the thoracic spine and bilateral ribs, increased in size and number compared to prior. Appearance is atypical for multiple myeloma but may represent a combination of treated, partially treated, and active disease. No acute pathologic fracture. Findings compatible with enterocolitis of infectious or inflammatory etiology. Involvement of the colon is greater than of the small bowel. Marked hepatic steatosis. Increased sclerosis of bone lesions throughout the thoracolumbar spine, bony pelvis, and proximal left femur. Appearance is atypical for multiple myeloma but may represent a combination of treated, partially treated, and active disease. No acute pathologic fracture.  - 1/29:CMV PCR  878,676HMCN  - 1/31: GI PCR panel positive only for Norovirus.   2/1: LP results: Neutrophils 6% in tube 1, Lymphocytes 88% (predominant), protein 42 (normal), glucose 36 (low), rapid HSV in CSF negative. CMV CSF PCR negative    - 2/2 CMV PCR 179,555High   - 2/2 Flex Sig Biopsies prelim: "colonic mucosa with a rare small non-necrotizing mucosal granuloma and rare CMV immunolabeling"   - 2/5 [place] work up: sIL2 (1556.6 high), ferritin (948 high), TG(258 high), NKA-IFN? 10 (WNL)  - 2/8: CMV PCR: 3107 (sig improvement)  - 2/10: C diff: negative.  -  2/12: Repeat GI viral PCR: positive for Norovirus  - 2/15 CMV 755  - 2/17 IgG pending    Plan:  - appreciate ID reccs   - Ganciclovir 5 mg/kg (1/31-). S/p one dose of IVIG on 2/7. Started on Alinia (nitazoxanide) for 3 days (2/16 - 2/18)   - GI consult: appreciate reccs: flex sig 2/2   - stop zosyn: s/p IV Zosyn 4.5 g Q8hrs (1/26-1/28, restarted 1/29 given recurrent fever-12/16/21)  - Weekly CMV  - Rifaximin dose adjusted to cover for SIBO  - OI ppx: Acyclovir IV    # Grade III neutropenia, at baseline  # Leukopenia  #Thrombocytopenia (possibly 2/2 liver disease)  Data: WBC of 2.2 on 12/05/21, on Zarxio at home  Plan: Continue to monitor    # Acute liver injury of unclear etiology  # Acute on chronic encephalopathy likely 2/2 infectious vs hepatic vs metabolic  # Hyperbilirubinemia  # Anasarca  # Hypoalbuminemia    Data:   -Steady rise in LFTs in 05/2021,  -12/02/21: AST 152--> 121, ALT 130--> 110, TB 5.4, Albumin 1.1  -Hepatitis panel negative.   -1/26:  Tbili 14.6, GGT 1160, ammonia 25  -CTAP 12/02/21: Scattered sclerotic lesions in the lumbar spine, hepatic steatosis.   -RUQ ultrasound: liver enlarged with diffuse increased echogenicity, consistent with fatty infiltration. 2cm stone in the gallbladder.   -HIDA scan: Normal radiotracer filling of the gallbladder is identified, therefore the exam is not consistent with cystic duct obstruction or acute cholecystitis.  - [place]  abd 1/27: Hepatomegaly with diffusely increased echogenicity which can be seen with hepatic amyloidosis or steatosis. Please refer to the recently performed biopsy for further details. Mild perihepatic ascites. Patent hepatic vasculature with normal waveforms. Cholelithiasis without acute cholecystitis. Right pleural effusion.  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 CMV stains: Negative.  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 Could be drug related. Increase Ursodiol to 600 mg BID, Check Hepatitis E. 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 Unlikely diarrhea from Ursodiol.                         - Consider avoiding steroid    - Daily LFT/INR  - RD consult placed to expand diet to more than protein shakes.  -- Family requesting appetite stimulant but risk > benefit.     # Acute toxic encephalopathy, improving  In the setting of liver failure, ammonia only 25. LP performed 2/1 to r/o encephalitis.     Data:   1/31: HIV, TSH, Folate, B12 WNL  1/30: CT head WNL other than sinus disease  2/1: LP results: Neutrophils 6% in tube 1, Lymphocytes 88% (predominant), protein 42 (normal), glucose 36 (low), rapid HSV in CSF negative. CMV CSF PCR negative  2/5: Brain MRI: Smooth diffuse  dural  thickening and enhancement as well as bilateral holohemispheric subdural effusions, which may be on the basis of diminished intracranial pressure following recent lumbar puncture.  -2/8: EEG: non-specific mild cerebral dysfunction.  -2/9: CT Brain: similar to prior MRI with hyperdense areas likely hygromas    Plan:  - Neurology consult: 2/7: Encephalopathy likely from underlying medical disease. Delirium prevention. Avoid Ativan and try Seroquel for delirium. MRI findings is from LP. Okay to try caffiene tablets in the daytime to help with postural headache but patient is allergic to it.  -2/8: Repeat CT head to monitor the status of subdural effusion: stable, EEG: diffuse slowing, Thiamine iv prophylaxis.  -Speech path: Regular and thin liquid. Slightly higher risk for aspiration given encephalopathy.  - Start empiric rifaximin given possibility of HE without elevated ammonia  -- dose adjusted as above to cover for SIBO  -- holding off on lactulose given diarrhea    # Deconditioning  Pt has not been engaging much with PT/OT despite improved mental status. May be due in part to anxieties about diarrhea/incontinence.  - continue PT/OT  - continue encouragement to participate, particular by family/familiar faces    # Hypokalemia:  # Hypomagnesemia:  Replete as needed.    # Vitamin D deficiency:  -Supplements ordered.    # Moderate malnutrition:  -Seen by RD and given recs for supplements.    # History of PE, diagnosed in April 2019  - hold home [place] for now given recent anemia and concerns for bleeding.   - monitor for signs and symptoms of worsening bleeding.     # Elevated TSH at OSH, likely 2/2 subclinical hypothyroidism  -TSH WNL on 12/15/20.      # Anxiety, emotional distress:   -Patient has stressful social situation, intellectually disabled son, and regarding underlying disease process.     # Chronic buttocks pressure wound  - Wound care consult, appreciate recs  - Position changes as tolerated, waffle  cushion, etc  -- Pressure wound ruled out, treating bilateral buttocks moisture and friction damage     # VTE PPx:  Contraindicated: Other: anticipate Plt <50    Severity of Illness  Stable     During this hospitalization the patient is also being treated for:  - Sepsis (present on admission)    - Malignancy associated fatigue on admission  - Neoplasm/malignancy associated pain  - Hypoalbuminemia (present on admission)  - Fluid status: Hypovolemia on admission     - Cachexia  - Encephalopathy: Metabolic encephalopathy     Code Status: FULL    [name], MD  01/01/22

## 2022-01-01 NOTE — Plan of Care (Signed)
Problem: Discharge Planning - Adult  Goal: Knowledge of and participation in plan of care  Outcome: Progress within 12 hours     Problem: Fall, at Risk or Actual - Adult  Goal: Absence of falls and fall related injury  Outcome: Progress within 12 hours  Goal: Knowledge of fall prevention  Outcome: Progress within 12 hours     Problem: Delirium - Adult / Pediatric  Goal: Absence or resolution of delirium  Outcome: Progress within 12 hours     Problem: Pain,  Acute / Chronic- Adult  Goal: Control of chronic pain  Outcome: Progress within 12 hours  Goal: Control of acute pain  Outcome: Progress within 12 hours     Problem: Fluid Volume, Imbalanced - Hematological / Immunological / Oncological Condition - Adult  Goal: Absence of fluid imbalance signs and symptoms  Outcome: Progress within 12 hours     Problem: Gas Exchange, Impaired- Hematological / Immunological / Oncological Condition - Adult  Goal: Adequate oxygenation (absence of signs and symptoms of hypoxemia)  Outcome: Progress within 12 hours     Problem: GU Elimination Impaired - Hematological / Immunological / Oncological Condition - Adult  Goal: Absence of urinary tract infection signs and symptoms  Outcome: Progress within 12 hours     Problem: Infection, at Risk and Actual - Hematological / Immunological / Oncological Condition - Adult  Goal: Prevention of infection  Outcome: Progress within 12 hours     Problem: Injury, High Risk for - Hematological/Immunological/Oncological Condition - Adult  Goal: Absence of injury  Outcome: Progress within 12 hours     Problem: Nutrition, Alteration in - Hematological / Immunological / Oncological Condition - Adult  Goal: Adequate nutritional intake  Outcome: Progress within 12 hours     Problem: Oral Mucus Membranes, at Risk or Alteration in Integrity - Immunological / Oncological Condition - Adult  Goal: Maintain or restore integrity of oral mucus membranes  Outcome: Progress within 12 hours     Problem: Skin  Integrity, Impaired - Hematological / Immunological / Oncological Condition - Adult  Goal: Skin / incision / wound healing  Outcome: Progress within 12 hours

## 2022-01-02 LAB — TYPE AND SCREEN
ABO/RH(D): O POS
Antibody Screen: NEGATIVE

## 2022-01-02 LAB — COMPREHENSIVE METABOLIC PANEL (BMP, AST, ALT, T.BILI, ALKP, TP ALB)
AST: 120 U/L — ABNORMAL HIGH (ref 5–44)
Alanine transaminase: 78 U/L — ABNORMAL HIGH (ref 10–61)
Albumin, Serum / Plasma: 1.3 g/dL — ABNORMAL LOW (ref 3.4–4.8)
Alkaline Phosphatase: 529 U/L — ABNORMAL HIGH (ref 38–108)
Anion Gap: 4 (ref 4–14)
Bilirubin, Total: 12.4 mg/dL — ABNORMAL HIGH (ref 0.2–1.2)
Calcium, total, Serum / Plasma: 7.4 mg/dL — ABNORMAL LOW (ref 8.4–10.5)
Carbon Dioxide, Total: 22 mmol/L (ref 22–29)
Chloride, Serum / Plasma: 114 mmol/L — ABNORMAL HIGH (ref 101–110)
Creatinine: 0.37 mg/dL — ABNORMAL LOW (ref 0.55–1.02)
Glucose, non-fasting: 74 mg/dL (ref 70–199)
Potassium, Serum / Plasma: 3.7 mmol/L (ref 3.5–5.0)
Protein, Total, Serum / Plasma: 3.4 g/dL — ABNORMAL LOW (ref 6.3–8.6)
Sodium, Serum / Plasma: 140 mmol/L (ref 135–145)
Urea Nitrogen, Serum / Plasma: 4 mg/dL — ABNORMAL LOW (ref 7–25)
eGFRcr: 115 mL/min/{1.73_m2} (ref >59)

## 2022-01-02 LAB — BASIC METABOLIC PANEL (NA, K, CL, CO2, BUN, CR, GLU, CA)
Anion Gap: 6 (ref 4–14)
Calcium, total, Serum / Plasma: 7.2 mg/dL — ABNORMAL LOW (ref 8.4–10.5)
Carbon Dioxide, Total: 23 mmol/L (ref 22–29)
Chloride, Serum / Plasma: 113 mmol/L — ABNORMAL HIGH (ref 101–110)
Creatinine: 0.36 mg/dL — ABNORMAL LOW (ref 0.55–1.02)
Glucose, non-fasting: 82 mg/dL (ref 70–199)
Potassium, Serum / Plasma: 3.1 mmol/L — ABNORMAL LOW (ref 3.5–5.0)
Sodium, Serum / Plasma: 142 mmol/L (ref 135–145)
Urea Nitrogen, Serum / Plasma: 3 mg/dL — ABNORMAL LOW (ref 7–25)
eGFRcr: 115 mL/min/{1.73_m2} (ref >59)

## 2022-01-02 LAB — COMPLETE BLOOD COUNT WITH DIFFERENTIAL
Abs Basophils: 0.01 10*9/L (ref 0.00–0.10)
Abs Eosinophils: 0.01 10*9/L (ref 0.00–0.40)
Abs Imm Granulocytes: 0.08 10*9/L (ref <0.10)
Abs Lymphocytes: 0.21 10*9/L — ABNORMAL LOW (ref 1.00–3.40)
Abs Monocytes: 0.07 10*9/L — ABNORMAL LOW (ref 0.20–0.80)
Abs Neutrophils: 3.8 10*9/L (ref 1.80–6.80)
Hematocrit: 23 % — ABNORMAL LOW (ref 36.0–46.0)
Hemoglobin: 7.5 g/dL — ABNORMAL LOW (ref 12.0–15.5)
MCH: 36.1 pg — ABNORMAL HIGH (ref 26.0–34.0)
MCHC: 32.6 g/dL (ref 31.0–36.0)
MCV: 111 fL — ABNORMAL HIGH (ref 80–100)
MPV: 12.1 fL (ref 9.1–12.6)
Platelet Count: 68 10*9/L — ABNORMAL LOW (ref 140–450)
RBC Count: 2.08 10*12/L — ABNORMAL LOW (ref 4.00–5.20)
RDW-CV: 25.2 % — ABNORMAL HIGH (ref 11.7–14.4)
WBC Count: 4.2 10*9/L (ref 3.4–10.0)

## 2022-01-02 LAB — MAGNESIUM, SERUM / PLASMA: Magnesium, Serum / Plasma: 2 mg/dL (ref 1.6–2.6)

## 2022-01-02 LAB — FIBRINOGEN, FUNCTIONAL: Fibrinogen, Functional: 152 mg/dL — ABNORMAL LOW (ref 202–430)

## 2022-01-02 LAB — BILIRUBIN, DIRECT: Bilirubin, Direct: 8.7 mg/dL — ABNORMAL HIGH (ref <0.6)

## 2022-01-02 LAB — PROTHROMBIN TIME
INR: 1.2 (ref 0.9–1.2)
PT: 15 s (ref 11.6–15.0)

## 2022-01-02 MED ORDER — POTASSIUM CHLORIDE 40 MEQ/100ML IN STERILE WATER INTRAVENOUS PIGGYBACK
40 | Freq: Once | INTRAVENOUS | Status: AC
Start: 2022-01-02 — End: 2022-01-02
  Administered 2022-01-03: 03:00:00 40 meq via INTRAVENOUS

## 2022-01-02 MED ORDER — NITAZOXANIDE 500 MG TABLET
500 | Freq: Two times a day (BID) | ORAL | Status: AC
Start: 2022-01-02 — End: 2022-01-03
  Administered 2022-01-03: 02:00:00 500 mg via ORAL
  Administered 2022-01-03: 18:00:00 via ORAL

## 2022-01-02 MED ORDER — HYDROXYZINE HCL 10 MG TABLET
10 mg | Freq: Three times a day (TID) | ORAL | Status: AC | PRN
Start: 2022-01-02 — End: 2022-01-15
  Administered 2022-01-04 – 2022-01-11 (×4): 10 mg via ORAL

## 2022-01-02 MED ORDER — URSODIOL 60 MG/ML ORAL SYRUP
60 mg/mL | ORAL | Status: DC
  Administered 2022-01-03 – 2022-01-04 (×3): via ORAL
  Administered 2022-01-04 – 2022-01-05 (×2): 600 mg via ORAL
  Administered 2022-01-05 – 2022-01-06 (×3): via ORAL
  Administered 2022-01-07: 18:00:00 600 mg via ORAL
  Administered 2022-01-07 – 2022-01-13 (×13): via ORAL
  Administered 2022-01-14 (×2): 600 mg via ORAL
  Administered 2022-01-15: 20:00:00 via ORAL

## 2022-01-02 MED ORDER — POTASSIUM CHLORIDE 20 MEQ/50 ML IN STERILE WATER INTRAVENOUS PIGGYBACK
20 | Freq: Once | INTRAVENOUS | Status: AC
Start: 2022-01-02 — End: 2022-01-02
  Administered 2022-01-03: 06:00:00 20 meq via INTRAVENOUS

## 2022-01-02 MED FILL — URSODIOL 300 MG CAPSULE: 300 mg | ORAL | Qty: 2

## 2022-01-02 MED FILL — NITAZOXANIDE 500 MG TABLET: 500 mg | ORAL | Qty: 1

## 2022-01-02 MED FILL — THIAMINE HCL (VITAMIN B1) 100 MG/ML INJECTION SOLUTION: 100 mg/mL | INTRAMUSCULAR | Qty: 2

## 2022-01-02 MED FILL — POTASSIUM CHLORIDE 20 MEQ/50 ML IN STERILE WATER INTRAVENOUS PIGGYBACK: 20 mEq/50 mL | INTRAVENOUS | Qty: 50

## 2022-01-02 MED FILL — POTASSIUM CHLORIDE 40 MEQ/100ML IN STERILE WATER INTRAVENOUS PIGGYBACK: 40 mEq/100 mL | INTRAVENOUS | Qty: 100

## 2022-01-02 MED FILL — DICYCLOMINE 10 MG CAPSULE: 10 mg | ORAL | Qty: 2

## 2022-01-02 MED FILL — XIFAXAN 550 MG TABLET: 550 mg | ORAL | Qty: 1

## 2022-01-02 MED FILL — ANTI-DIARRHEAL (LOPERAMIDE) 2 MG TABLET: 2 mg | ORAL | Qty: 2

## 2022-01-02 MED FILL — ALINIA 100 MG/5 ML ORAL SUSPENSION: 100 mg/5 mL | ORAL | Qty: 25

## 2022-01-02 MED FILL — GANCICLOVIR SODIUM 500 MG INTRAVENOUS SOLUTION: 500 mg | INTRAVENOUS | Qty: 8

## 2022-01-02 MED FILL — DILAUDID (PF) 0.5 MG/0.5 ML INJECTION SYRINGE: 0.5 mg/ mL | INTRAMUSCULAR | Qty: 0.5

## 2022-01-02 MED FILL — ONDANSETRON HCL (PF) 4 MG/2 ML INJECTION SOLUTION: 4 mg/2 mL | INTRAMUSCULAR | Qty: 4

## 2022-01-02 MED FILL — FLINTSTONES COMPLETE (IRON) CHEWABLE TABLET: ORAL | Qty: 1

## 2022-01-02 MED FILL — VITAMIN E (DL, ACETATE) 45 MG (100 UNIT) CAPSULE: 45 mg (100 unit) | ORAL | Qty: 8

## 2022-01-02 NOTE — Plan of Care (Signed)
Pt continuing to have incontinent episodes of watery/loose stool overnight and during the day. Rectal tube placed d/t infection risk with foley catheter and sacral wounds. VSS.    Problem: Discharge Planning - Adult  Goal: Knowledge of and participation in plan of care  Outcome: Progress within 12 hours     Problem: Fall, at Risk or Actual - Adult  Goal: Absence of falls and fall related injury  Outcome: Progress within 12 hours  Goal: Knowledge of fall prevention  Outcome: Progress within 12 hours     Problem: Delirium - Adult / Pediatric  Goal: Absence or resolution of delirium  Outcome: Progress within 12 hours     Problem: Pain,  Acute / Chronic- Adult  Goal: Control of chronic pain  Outcome: Progress within 12 hours  Goal: Control of acute pain  Outcome: Progress within 12 hours     Problem: Fluid Volume, Imbalanced - Hematological / Immunological / Oncological Condition - Adult  Goal: Absence of fluid imbalance signs and symptoms  Outcome: Progress within 12 hours     Problem: Gas Exchange, Impaired- Hematological / Immunological / Oncological Condition - Adult  Goal: Adequate oxygenation (absence of signs and symptoms of hypoxemia)  Outcome: Progress within 12 hours     Problem: GI Elimination Impaired - Hematological / Immunological / Oncological Condition - Adult  Goal: Passage of soft, formed stool  Outcome: Progress within 12 hours     Problem: GU Elimination Impaired - Hematological / Immunological / Oncological Condition - Adult  Goal: Absence of urinary tract infection signs and symptoms  Outcome: Progress within 12 hours     Problem: Infection, at Risk and Actual - Hematological / Immunological / Oncological Condition - Adult  Goal: Prevention of infection  Outcome: Progress within 12 hours     Problem: Injury, High Risk for - Hematological/Immunological/Oncological Condition - Adult  Goal: Absence of injury  Outcome: Progress within 12 hours     Problem: Nutrition, Alteration in - Hematological /  Immunological / Oncological Condition - Adult  Goal: Adequate nutritional intake  Outcome: Progress within 12 hours     Problem: Oral Mucus Membranes, at Risk or Alteration in Integrity - Immunological / Oncological Condition - Adult  Goal: Maintain or restore integrity of oral mucus membranes  Outcome: Progress within 12 hours     Problem: Skin Integrity, Impaired - Hematological / Immunological / Oncological Condition - Adult  Goal: Skin / incision / wound healing  Outcome: Progress within 12 hours

## 2022-01-02 NOTE — Progress Notes (Signed)
[facility] PROGRESS NOTE  ATTENDING ONLY     My date of service is 01/02/22.    24 Hour Course  NAEON    Subjective  Diarrhea persists without much change. At time of interview pt requested to be changed due to diarrhea.    Vitals  Temp:  [36.3 C (97.3 F)-36.7 C (98.1 F)] 36.5 C (97.7 F)  Heart Rate:  [63-82] 73  *Resp:  [16-18] 16  BP: (96-109)/(61-72) 104/72  SpO2:  [95 %-98 %] 95 %    MostRecent Weight:  (Unable to zero bed @ this time. Large difference in wt w/wt from day before)  Admission Weight: 79.6 kg (175 lb 7.8 oz)      Intake/Output Summary (Last 24 hours) at 01/02/2022 1202  Last data filed at 01/02/2022 1000  Gross per 24 hour   Intake 4269 ml   Output 1350 ml   Net 2919 ml     Pain Score: 5    Physical Exam  Vitals reviewed.   Constitutional:       Appearance: She is ill-appearing.   HENT:      Head: Normocephalic and atraumatic.      Nose: Nose normal.      Mouth/Throat:      Mouth: Mucous membranes are moist.      Pharynx: Oropharynx is clear.   Eyes:      General: Scleral icterus present.      Extraocular Movements: Extraocular movements intact.      Pupils: Pupils are equal, round, and reactive to light.   Cardiovascular:      Rate and Rhythm: Normal rate and regular rhythm.      Pulses: Normal pulses.      Heart sounds: Normal heart sounds.   Pulmonary:      Effort: Pulmonary effort is normal.      Breath sounds: Normal breath sounds.   Abdominal:      General: Abdomen is flat. Bowel sounds are normal.      Palpations: Abdomen is soft.   Musculoskeletal:         General: No swelling. Normal range of motion.      Cervical back: Normal range of motion and neck supple.      Right lower leg: No edema.      Left lower leg: No edema.   Skin:     General: Skin is warm and dry.      Capillary Refill: Capillary refill takes less than 2 seconds.      Coloration: Skin is jaundiced.   Neurological:      General: No focal deficit present.      Mental Status: She is alert and oriented  to person, place, and time.   Psychiatric:         Mood and Affect: Mood normal.         Behavior: Behavior is slowed.     Scheduled Meds:   0.9% sodium chloride flush  3 mL Intravenous Q12H [place]    clotrimazole   Topical BID SCH    dicyclomine  20 mg Oral 4x Daily [place]    dry mouth oral rinse  5 mL Mucous Membrane 4x Daily [place]    ergocalciferol (vitamin D2)  50,000 Units Oral Q7 Days [place]    ganciclovir (CYTOVENE) IVPB  5 mg/kg (Dosing Weight) Intravenous Q12H [place]    multivitamin complete chewable  1 tablet Oral Daily [place]    nitazoxanide  500 mg Oral Q12H [facility]  potassium chloride in sterile water  40 mEq Intravenous Once    QUEtiapine  50 mg Oral Once    rifAXIMin  550 mg Oral TID [facility]    thiamine  200 mg Intravenous Q24H    ursodioL  600 mg Oral BID [place]    vitamin E (dl, acetate)  800 Units Oral Daily [place]     Continuous Infusions:   dextrose 5 % and 0.9 % sodium chloride 75 mL/hr (01/02/22 0557)     PRN Meds:   0.9% sodium chloride flush  3 mL Intravenous PRN    albuterol  2.5 mg Nebulization Q4H PRN    Or    albuterol  2.5 mg Nebulization Q4H PRN    HYDROmorphone  0.3-0.8 mg Intravenous Q4H PRN    loperamide  4 mg Oral 4x Daily PRN    magnesium sulfate in dextrose 5 %  2-4 g Intravenous Daily PRN    Or    magnesium sulfate in water  2-4 g Intravenous Daily PRN    ondansetron  8 mg Intravenous Q8H PRN    polyethylene glycol  17 g Oral Daily PRN    potassium chloride in sterile water  20-80 mEq Intravenous Daily PRN    Or    potassium chloride in sterile water  20-80 mEq Intravenous Daily PRN    Or    potassium chloride  20-80 mEq Oral Daily PRN     Data    CBC        01/02/22  0556 01/01/22  0334   WBC 4.2 4.7   HGB 7.5* 7.3*   HCT 23.0* 22.7*   PLT 68* 63*     Coags        01/02/22  0556 01/01/22  0334   INR 1.2 1.3*     Chem7        01/02/22  0556 01/01/22  1600 01/01/22  0334   NA 140 142 141   K 3.7 3.1* 3.4*   CL 114* 113* 114*   CO2 _0 BUN 4* 3* 4*   CREAT 0.37* 0.36* 0.39*    GLU 74 82 83     Electrolytes        01/02/22  0556 01/01/22  1600 01/01/22  0334   CA 7.4* 7.2* 7.5*   MG 2.0  --  1.8     Liver Panel        01/02/22  0556 01/01/22  0334   AST 120* 100*   ALT 78* 71*   ALKP 529* 522*   TBILI 12.4* 13.2*   TP 3.4* 3.4*   ALB 1.3* 1.2*     Lactate  No results found in last 36 hours    Microbiology Results (last 24 hours)     ** No results found for the last 24 hours. **        Radiology Results  No results found.     I discussed the patient with Kari Baars, MD from [facility] regarding A&P.    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 The patient has received VRd X 4 (achieving VGPR) then Mel 200 ASCT 05/12/11. 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 She met the criteria for PD on 02/19/2020 and was started on third line DARA PD in June 2021, which was recently held on 10/20/21 due to chronic  diarrhea. Patient was transferred OSH after recent hypovolemic/distributive shock. Here for acute encephalopathy workup and acute liver failure workup.      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   June 2020 due to progression of M spike Revlimid was increased to 15 mg PO D1-21 every 28 days, plus dexamethasone  July 2021: started daratumumab, Pomylast 2 mg D 1-21, Q28 days, dexamethasone 12 mg PO Q weekly.   Primary oncologist: Dr. Iona Coach,  Dr. Kennon Rounds [place] [facility]  Receiving dexamethasone 12 mg weekly for diarrhea  Receiving MT with daratumumab with pomalyst and dex, most recent dose 10/16/21, has since been on hold due to worsening diarrhea.     DATA:   09/07/2010: IgG Kappa MM  12/02/2021: Ig M <5, IgG 447, IgA 20, Ig E <2, SFLC Kappa 16.6, Lambda 1.6, Kappa/Lamda 10.38  12/14/21:  IgG K on IFE, IgG 430, KLC 16.6, LLC 2.7, KLR 6.15    Chemo:   Holding Daratumumab and Pomalyst, last dose 10/20/21, given recent infection and acute liver failure.    Supportive Care:   Transfusion: Keep Hg >7, and Platelets >10.   Per daughter, has autoantibodies in blood was a difficult match. Would like to be contacted prior to transfusion.   Line: PICC placed 1/26  Outpatient Oncologist: Dr. Kennon Rounds    Dispo: TBD after resolution of diarrhea and liver failure, likely transfer back to OSH once stabilized    # Immunocompromised  # CMV colitis  # At risk for infections due to malignancy  # Shock, likely hypovolemic vs distributive in the setting of chronic diarrhea, + norovirus infection   # Acute on chronic diarrhea, norovirus positive., diff and OP negative.   -No prior evidence of adrenal insufficiency, AM cortisol levels normal, previously on dexamethasone 8 mg PO Qweekly, but has been held since early December.    Data:  - CT brain: No acute intracranial hemorrhage, herniation, or hydrocephalus.  - CT chest/A/P pending: Medium-sized bilateral pleural effusions with bilateral peribronchovascular groundglass opacities, suggestive of pulmonary edema. Clustered nodules in right middle lobe along the right minor fissure, which could represent mild aspiration or infection. Numerous sclerotic and mixed lytic/sclerotic lesions throughout the thoracic spine and bilateral ribs, increased in size and number compared to prior. Appearance is atypical for multiple myeloma but may represent a combination of treated, partially treated, and active disease. No acute pathologic fracture. Findings compatible with enterocolitis of infectious or inflammatory etiology. Involvement of the colon is greater than of the small bowel. Marked hepatic steatosis. Increased sclerosis of bone lesions throughout the thoracolumbar spine, bony pelvis, and proximal left femur. Appearance is atypical for multiple myeloma but may represent a  combination of treated, partially treated, and active disease. No acute pathologic fracture.  - 1/29:CMV PCR [id number]  - 1/31: GI PCR panel positive only for Norovirus.   2/1: LP results: Neutrophils 6% in tube 1, Lymphocytes 88% (predominant), protein 42 (normal), glucose 36 (low), rapid HSV in CSF negative. CMV CSF PCR negative    - 2/2 CMV PCR 179,555High   - 2/2 Flex Sig Biopsies prelim: "colonic mucosa with a rare small non-necrotizing mucosal granuloma and rare CMV immunolabeling"   - 2/5 [place] work up: sIL2 (1556.6 high), ferritin (948 high), TG(258 high), NKA-IFN? 10 (WNL)  -  2/8: CMV PCR: 3107 (sig improvement)  - 2/10: C diff: negative.  - 2/12: Repeat GI viral PCR: positive for Norovirus  - 2/15 CMV 755  - 2/17 IgG pending    Plan:  - appreciate ID reccs  -- Ganciclovir 5 mg/kg (1/31-). S/p one dose of IVIG on 2/7. Started on Alinia (nitazoxanide) for 3 days (2/16 - 2/18)  -- s/p IV Zosyn 4.5 g Q8hrs (1/26-1/28, restarted 1/29 - 2/1 for recurrent fever)  - GI consult: appreciate recs: flex sig 2/2  - reconsult GI if Alinia fails  - Weekly CMV  - Rifaximin dose adjusted to cover for SIBO (2/14 - 2/16 400 mg, 550 mg TID 2/16->)  - OI ppx: Acyclovir IV    # Grade III neutropenia, at baseline  # Leukopenia  # Thrombocytopenia (possibly 2/2 liver disease)  Data: WBC of 2.2 on 12/05/21, on Zarxio at home  Plan: Continue to monitor    # Acute liver injury of unclear etiology  # Acute on chronic encephalopathy likely 2/2 infectious vs hepatic vs metabolic  # Hyperbilirubinemia  # Anasarca  # Hypoalbuminemia    Data:   -Steady rise in LFTs in 05/2021,  -12/02/21: AST 152--> 121, ALT 130--> 110, TB 5.4, Albumin 1.1  -Hepatitis panel negative.   -1/26:  Tbili 14.6, GGT 1160, ammonia 25  -CTAP 12/02/21: Scattered sclerotic lesions in the lumbar spine, hepatic steatosis.   -RUQ ultrasound: liver enlarged with diffuse increased echogenicity, consistent with fatty infiltration. 2cm stone in the gallbladder.   -HIDA  scan: Normal radiotracer filling of the gallbladder is identified, therefore the exam is not consistent with cystic duct obstruction or acute cholecystitis.  - [place] abd 1/27: Hepatomegaly with diffusely increased echogenicity which can be seen with hepatic amyloidosis or steatosis. Please refer to the recently performed biopsy for further details. Mild perihepatic ascites. Patent hepatic vasculature with normal waveforms. Cholelithiasis without acute cholecystitis. Right pleural effusion.  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 CMV stains: Negative.  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 Could be drug related. Increase Ursodiol to 600 mg BID, Check Hepatitis E. 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 Unlikely diarrhea from Ursodiol.  - Avoiding steroids  - Daily LFT/INR  - RD consult placed to expand diet to more than protein shakes.  -- Family requesting appetite stimulant but risk > benefit.     # Acute toxic encephalopathy, improving  In the setting of liver failure, ammonia only 25. LP performed 2/1 to r/o encephalitis.     Data:   1/31: HIV, TSH, Folate, B12 WNL  1/30: CT head WNL other than sinus disease  2/1: LP results: Neutrophils 6% in tube 1, Lymphocytes 88% (predominant),  protein 42 (normal), glucose 36 (low), rapid HSV in CSF negative. CMV CSF PCR negative  2/5: Brain  MRI: Smooth diffuse dural thickening and enhancement as well as bilateral holohemispheric subdural effusions, which may be on the basis of diminished intracranial pressure following recent lumbar puncture.  -2/8: EEG: non-specific mild cerebral dysfunction.  -2/9: CT Brain: similar to prior MRI with hyperdense areas likely hygromas    Plan:  - Neurology consult: 2/7: Encephalopathy likely from underlying medical disease. Delirium prevention. Avoid Ativan and try Seroquel for delirium. MRI findings is from LP. Okay to try caffiene tablets in the daytime to help with postural headache but patient is allergic to it.  -2/8: Repeat CT head to monitor the status of subdural effusion: stable, EEG: diffuse slowing, Thiamine iv prophylaxis.  -Speech path: Regular and thin liquid. Slightly higher risk for aspiration given encephalopathy.  - Start empiric rifaximin given possibility of HE without elevated ammonia (2/14 ->)  -- dose adjusted as above to cover for SIBO  -- holding off on lactulose given diarrhea    # Deconditioning  Pt has not been engaging much with PT/OT despite improved mental status. May be due in part to anxieties about diarrhea/incontinence.  - continue PT/OT  - continue encouragement to participate, particularly by family/familiar faces    # Hypokalemia:  # Hypomagnesemia:  Replete as needed.    # Vitamin D deficiency:  -Supplements ordered.    # Moderate malnutrition:  -Seen by RD and given recs for supplements.    # History of PE, diagnosed in April 2019  - hold home [place] for now given recent anemia and concerns for bleeding.   - monitor for signs and symptoms of worsening bleeding.     # Elevated TSH at OSH, likely 2/2 subclinical hypothyroidism  -TSH WNL on 12/15/20.      # Anxiety, emotional distress:   -Patient has stressful social situation, intellectually disabled son, and regarding underlying  disease process.     # Chronic buttocks pressure wound  - Wound care consult, appreciate recs  - Position changes as tolerated, waffle cushion, etc  -- Pressure wound ruled out, treating bilateral buttocks moisture and friction damage     Wound care:  - Bilateral Groin/Thighs   Gently cleanse with warm water & Remedy no-rinse foam (pH-balanced cleanser) with soft cloths, pat dry ensuring   thoroughly dried.   Do not use CHG to rash areas.   Apply Clotrimazole ointment to rash BID, ensure thoroughly rubbed in.   Apply a piece of Interdry textile [phone]) leaving 2 inches exposed from fold to facilitate wicking.     - Bilateral Buttocks   First rinse with perineal irrigation bottle & warm water to remove as much stool as possible to minimize wiping.   Cleanse wounds & surrounding skin with Anasept wound cleanser spray ([id number]#[id number]), pat dry with sterile gauze.   Apply a thick layer of Triad hydrophilic wound dressing paste (ION#[id number]) over wounds & periwound areas.   Promptly clean patient after episodes of incontinence using method described.   Remove only the top, soiled layer of Triad during incontinence cleaning to avoid further eroding the skin & apply another top   layer of Triad following cleaning.   Apply Triad at least BID & PRN to keep area protected.     - Pressure Injury Prevention   Continue q2h repositioning- 30 degree lateral turn to L & R only unless medically contraindicated to avoid direct pressure on   sacrum.   Place Waffle mattress overlay [phone]) under patient in bed to assist with pressure redistribution.   Use waffle  cushion while OOB to chair, reposition frequently while in chair every 30 minutes.   Additionally, float heels completely off of bed. If pillows do not suffice, order heel offloading boots (PMM# [id number]).     # VTE PPx:  Contraindicated: Other: anticipate Plt <50    Severity of Illness  Stable     During this hospitalization the patient is also being treated for:  - Sepsis  (present on admission)    - Malignancy associated fatigue on admission  - Neoplasm/malignancy associated pain  - Hypoalbuminemia (present on admission)  - Fluid status: Hypovolemia on admission     - Cachexia  - Encephalopathy: Metabolic encephalopathy     Code Status: FULL    [name], MD  01/02/22

## 2022-01-02 NOTE — Plan of Care (Signed)
Problem: Fall, at Risk or Actual - Adult  Goal: Absence of falls and fall related injury  Outcome: Progress within 12 hours     Problem: Delirium - Adult / Pediatric  Goal: Absence or resolution of delirium  Outcome: Progress within 12 hours   Remains AOx4, calls appropriately for assist.    Problem: Pain,  Acute / Chronic- Adult  Goal: Control of chronic pain  Outcome: Progress within 12 hours     Problem: GU Elimination Impaired - Hematological / Immunological / Oncological Condition - Adult  Goal: Absence of urinary tract infection signs and symptoms  Outcome: Not Progressing   4 loose/ watery BMs throughout the shift. Incontinent but able to use bedpan most of the time.    Problem: Skin Integrity, Impaired - Hematological / Immunological / Oncological Condition - Adult  Goal: Skin / incision / wound healing  Outcome: Progress within 12 hours  Wound care completed, incontinent cleanser/ anasept/ triad paste with each BM. Clotrimazole to rash on groin. Appears to be improving from previous photos taken.

## 2022-01-03 LAB — COMPREHENSIVE METABOLIC PANEL (BMP, AST, ALT, T.BILI, ALKP, TP ALB)
AST: 116 U/L — ABNORMAL HIGH (ref 5–44)
Alanine transaminase: 79 U/L — ABNORMAL HIGH (ref 10–61)
Albumin, Serum / Plasma: 1.2 g/dL — ABNORMAL LOW (ref 3.4–4.8)
Alkaline Phosphatase: 497 U/L — ABNORMAL HIGH (ref 38–108)
Anion Gap: 5 (ref 4–14)
Bilirubin, Total: 11.6 mg/dL — ABNORMAL HIGH (ref 0.2–1.2)
Calcium, total, Serum / Plasma: 7.2 mg/dL — ABNORMAL LOW (ref 8.4–10.5)
Carbon Dioxide, Total: 22 mmol/L (ref 22–29)
Chloride, Serum / Plasma: 112 mmol/L — ABNORMAL HIGH (ref 101–110)
Creatinine: 0.31 mg/dL — ABNORMAL LOW (ref 0.55–1.02)
Glucose, non-fasting: 83 mg/dL (ref 70–199)
Potassium, Serum / Plasma: 3.2 mmol/L — ABNORMAL LOW (ref 3.5–5.0)
Protein, Total, Serum / Plasma: 3.3 g/dL — ABNORMAL LOW (ref 6.3–8.6)
Sodium, Serum / Plasma: 139 mmol/L (ref 135–145)
Urea Nitrogen, Serum / Plasma: 3 mg/dL — ABNORMAL LOW (ref 7–25)
eGFRcr: 120 mL/min/{1.73_m2} (ref >59)

## 2022-01-03 LAB — COMPLETE BLOOD COUNT WITH DIFFERENTIAL
Abs Basophils: 0 10*9/L (ref 0.00–0.10)
Abs Eosinophils: 0 10*9/L (ref 0.00–0.40)
Abs Imm Granulocytes: 0.05 10*9/L (ref <0.10)
Abs Lymphocytes: 0.19 10*9/L — ABNORMAL LOW (ref 1.00–3.40)
Abs Monocytes: 0.09 10*9/L — ABNORMAL LOW (ref 0.20–0.80)
Abs Neutrophils: 3.17 10*9/L (ref 1.80–6.80)
Hematocrit: 21.8 % — ABNORMAL LOW (ref 36.0–46.0)
Hemoglobin: 7.3 g/dL — ABNORMAL LOW (ref 12.0–15.5)
MCH: 37.1 pg — ABNORMAL HIGH (ref 26.0–34.0)
MCHC: 33.5 g/dL (ref 31.0–36.0)
MCV: 111 fL — ABNORMAL HIGH (ref 80–100)
MPV: 13.3 fL — ABNORMAL HIGH (ref 9.1–12.6)
Platelet Count: 67 10*9/L — ABNORMAL LOW (ref 140–450)
RBC Count: 1.97 10*12/L — ABNORMAL LOW (ref 4.00–5.20)
RDW-CV: 24.9 % — ABNORMAL HIGH (ref 11.7–14.4)
WBC Count: 3.5 10*9/L (ref 3.4–10.0)

## 2022-01-03 LAB — BASIC METABOLIC PANEL (NA, K, CL, CO2, BUN, CR, GLU, CA)
Anion Gap: 4 (ref 4–14)
Calcium, total, Serum / Plasma: 7.2 mg/dL — ABNORMAL LOW (ref 8.4–10.5)
Carbon Dioxide, Total: 22 mmol/L (ref 22–29)
Chloride, Serum / Plasma: 112 mmol/L — ABNORMAL HIGH (ref 101–110)
Creatinine: 0.33 mg/dL — ABNORMAL LOW (ref 0.55–1.02)
Glucose, non-fasting: 80 mg/dL (ref 70–199)
Potassium, Serum / Plasma: 3.4 mmol/L — ABNORMAL LOW (ref 3.5–5.0)
Sodium, Serum / Plasma: 138 mmol/L (ref 135–145)
Urea Nitrogen, Serum / Plasma: 3 mg/dL — ABNORMAL LOW (ref 7–25)
eGFRcr: 118 mL/min/{1.73_m2} (ref >59)

## 2022-01-03 LAB — BILIRUBIN, DIRECT: Bilirubin, Direct: 8.3 mg/dL — ABNORMAL HIGH (ref <0.6)

## 2022-01-03 LAB — MAGNESIUM, SERUM / PLASMA: Magnesium, Serum / Plasma: 1.7 mg/dL (ref 1.6–2.6)

## 2022-01-03 LAB — FIBRINOGEN, FUNCTIONAL: Fibrinogen, Functional: 157 mg/dL — ABNORMAL LOW (ref 202–430)

## 2022-01-03 LAB — PROTHROMBIN TIME
INR: 1.4 — ABNORMAL HIGH (ref 0.9–1.2)
PT: 16.1 s — ABNORMAL HIGH (ref 11.6–15.0)

## 2022-01-03 MED ORDER — POTASSIUM CHLORIDE 40 MEQ/100ML IN STERILE WATER INTRAVENOUS PIGGYBACK
40 | Freq: Once | INTRAVENOUS | Status: DC
Start: 2022-01-03 — End: 2022-01-03

## 2022-01-03 MED ORDER — NITAZOXANIDE 500 MG TABLET
500 mg | Freq: Two times a day (BID) | ORAL | Status: AC
Start: 2022-01-03 — End: 2022-01-07
  Administered 2022-01-04: 05:00:00 via ORAL
  Administered 2022-01-04 – 2022-01-07 (×6): 500 mg via ORAL

## 2022-01-03 MED ORDER — NITAZOXANIDE 500 MG TABLET
500 | Freq: Two times a day (BID) | ORAL | Status: DC
Start: 2022-01-03 — End: 2022-01-03

## 2022-01-03 MED ORDER — MAGNESIUM SULFATE 2 GRAM/50 ML (4 %) IN WATER INTRAVENOUS PIGGYBACK
2 | Freq: Once | INTRAVENOUS | Status: DC
Start: 2022-01-03 — End: 2022-01-03

## 2022-01-03 MED ORDER — POTASSIUM CHLORIDE 20 MEQ/50 ML IN STERILE WATER INTRAVENOUS PIGGYBACK
20 | Freq: Once | INTRAVENOUS | Status: DC
Start: 2022-01-03 — End: 2022-01-03

## 2022-01-03 MED FILL — MAGNESIUM SULFATE 1 GRAM/100 ML IN DEXTROSE 5 % INTRAVENOUS PIGGYBACK: 1 g/00 mL | INTRAVENOUS | Qty: 100

## 2022-01-03 MED FILL — DICYCLOMINE 10 MG CAPSULE: 10 mg | ORAL | Qty: 2

## 2022-01-03 MED FILL — DILAUDID (PF) 0.5 MG/0.5 ML INJECTION SYRINGE: 0.5 mg/ mL | INTRAMUSCULAR | Qty: 1

## 2022-01-03 MED FILL — MAGNESIUM SULFATE 2 GRAM/50 ML (4 %) IN WATER INTRAVENOUS PIGGYBACK: 2 gram/50 mL (4 %) | INTRAVENOUS | Qty: 50

## 2022-01-03 MED FILL — POTASSIUM CHLORIDE 20 MEQ/50 ML IN STERILE WATER INTRAVENOUS PIGGYBACK: 20 mEq/50 mL | INTRAVENOUS | Qty: 50

## 2022-01-03 MED FILL — URSODIOL (BULK) 100 % POWDER: 100 % | Qty: 0.6

## 2022-01-03 MED FILL — GANCICLOVIR SODIUM 500 MG INTRAVENOUS SOLUTION: 500 mg | INTRAVENOUS | Qty: 8

## 2022-01-03 MED FILL — NITAZOXANIDE 500 MG TABLET: 500 mg | ORAL | Qty: 1

## 2022-01-03 MED FILL — THIAMINE HCL (VITAMIN B1) 100 MG/ML INJECTION SOLUTION: 100 mg/mL | INTRAMUSCULAR | Qty: 2

## 2022-01-03 MED FILL — CLOTRIMAZOLE 1 % TOPICAL CREAM: 1 % | TOPICAL | Qty: 15

## 2022-01-03 MED FILL — XIFAXAN 550 MG TABLET: 550 mg | ORAL | Qty: 1

## 2022-01-03 MED FILL — VITAMIN E (DL, ACETATE) 45 MG (100 UNIT) CAPSULE: 45 mg (100 unit) | ORAL | Qty: 8

## 2022-01-03 MED FILL — POTASSIUM CHLORIDE 40 MEQ/100ML IN STERILE WATER INTRAVENOUS PIGGYBACK: 40 mEq/100 mL | INTRAVENOUS | Qty: 100

## 2022-01-03 MED FILL — DILAUDID (PF) 0.5 MG/0.5 ML INJECTION SYRINGE: 0.5 mg/ mL | INTRAMUSCULAR | Qty: 0.5

## 2022-01-03 MED FILL — URSODIOL 300 MG CAPSULE: 300 mg | ORAL | Qty: 2

## 2022-01-03 MED FILL — FLINTSTONES COMPLETE (IRON) CHEWABLE TABLET: ORAL | Qty: 1

## 2022-01-03 NOTE — Progress Notes (Signed)
[facility] PROGRESS NOTE  ATTENDING ONLY     My date of service is 01/03/22.    24 Hour Course  Rectal tube placed    Subjective  Diarrhea persists without much change. Having pain from rectal tube but managed well with pain medication. When asked about exercise, mentions that she likes to exercise in bed, demonstrating a squeezing motion with her hands and plantarflexing her feet without resistance.    Vitals  Temp:  [36.7 C (98.1 F)-37.1 C (98.8 F)] 36.7 C (98.1 F)  Heart Rate:  [74-79] 79  *Resp:  [16-18] 16  BP: (96-112)/(65-78) 102/69  SpO2:  [95 %-97 %] 95 %    MostRecent Weight: 92.2 kg (203 lb 4.2 oz)  Admission Weight: 79.6 kg (175 lb 7.8 oz)      Intake/Output Summary (Last 24 hours) at 01/03/2022 1424  Last data filed at 01/03/2022 1300  Gross per 24 hour   Intake 5172 ml   Output 2550 ml   Net 2622 ml     Pain Score: 0    Physical Exam  Vitals reviewed.   Constitutional:       Appearance: She is ill-appearing.   HENT:      Head: Normocephalic and atraumatic.      Nose: Nose normal.      Mouth/Throat:      Mouth: Mucous membranes are moist.      Pharynx: Oropharynx is clear.   Eyes:      General: Scleral icterus present.      Extraocular Movements: Extraocular movements intact.      Pupils: Pupils are equal, round, and reactive to light.   Cardiovascular:      Rate and Rhythm: Normal rate and regular rhythm.      Pulses: Normal pulses.      Heart sounds: Normal heart sounds.   Pulmonary:      Effort: Pulmonary effort is normal.      Breath sounds: Normal breath sounds.   Abdominal:      General: Abdomen is flat. Bowel sounds are normal.      Palpations: Abdomen is soft.   Musculoskeletal:         General: No swelling. Normal range of motion.      Cervical back: Normal range of motion and neck supple.      Right lower leg: No edema.      Left lower leg: No edema.   Skin:     General: Skin is warm and dry.      Capillary Refill: Capillary refill takes less than 2 seconds.       Coloration: Skin is jaundiced (improving).   Neurological:      General: No focal deficit present.      Mental Status: She is alert and oriented to person, place, and time.   Psychiatric:         Mood and Affect: Mood normal.         Behavior: Behavior is slowed.     Scheduled Meds:   0.9% sodium chloride flush  3 mL Intravenous Q12H [place]    clotrimazole   Topical BID SCH    dicyclomine  20 mg Oral 4x Daily [place]    dry mouth oral rinse  5 mL Mucous Membrane 4x Daily [place]    ergocalciferol (vitamin D2)  50,000 Units Oral Q7 [place]    ganciclovir (CYTOVENE) IVPB  5 mg/kg (Dosing Weight) Intravenous Q12H [place]    multivitamin complete chewable  1 tablet Oral Daily [place]    nitazoxanide  500 mg Oral Q12H [place]    nitazoxanide  500 mg Oral Q12H [place]    QUEtiapine  50 mg Oral Once    rifAXIMin  550 mg Oral TID [facility]    thiamine  200 mg Intravenous Q24H    ursodiol  600 mg Oral BID [place]    vitamin E (dl, acetate)  800 Units Oral Daily [place]     Continuous Infusions:   dextrose 5 % and 0.9 % sodium chloride 75 mL/hr (01/03/22 0556)     PRN Meds:   0.9% sodium chloride flush  3 mL Intravenous PRN    albuterol  2.5 mg Nebulization Q4H PRN    Or    albuterol  2.5 mg Nebulization Q4H PRN    HYDROmorphone  0.3-0.8 mg Intravenous Q4H PRN    hydrOXYzine  10 mg Oral Q8H PRN    loperamide  4 mg Oral 4x Daily PRN    magnesium sulfate in dextrose 5 %  2-4 g Intravenous Daily PRN    Or    magnesium sulfate in water  2-4 g Intravenous Daily PRN    ondansetron  8 mg Intravenous Q8H PRN    polyethylene glycol  17 g Oral Daily PRN    potassium chloride in sterile water  20-80 mEq Intravenous Daily PRN    Or    potassium chloride in sterile water  20-80 mEq Intravenous Daily PRN    Or    potassium chloride  20-80 mEq Oral Daily PRN     Data    CBC        01/03/22  0426 01/02/22  0556   WBC 3.5 4.2   HGB 7.3* 7.5*   HCT 21.8* 23.0*   PLT 67* 68*     Coags        01/03/22  0426 01/02/22  0556   INR 1.4* 1.2     Chem7         01/03/22  0426 01/02/22  1613 01/02/22  0556   NA 139 138 140   K 3.2* 3.4* 3.7   CL 112* 112* 114*   CO2 22 22 22    BUN 3* 3* 4*   CREAT 0.31* 0.33* 0.37*   GLU 83 80 74     Electrolytes        01/03/22  0426 01/02/22  1613 01/02/22  0556   CA 7.2* 7.2* 7.4*   MG 1.7  --  2.0     Liver Panel        01/03/22  0426 01/02/22  0556   AST 116* 120*   ALT 79* 78*   ALKP 497* 529*   TBILI 11.6* 12.4*   TP 3.3* 3.4*   ALB 1.2* 1.3*     Lactate  No results found in last 36 hours    Microbiology Results (last 24 hours)     ** No results found for the last 24 hours. **        Radiology Results  No results found.     I discussed the patient with Kari Baars, MD from [facility] regarding A&P.    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 The patient has received VRd X 4 (achieving VGPR) then Mel 200 ASCT 05/12/11. 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 She met the  criteria for PD on 02/19/2020 and was started on third line DARA PD in June 2021, which was recently held on 10/20/21 due to chronic diarrhea. Patient was transferred OSH after recent hypovolemic/distributive shock. Here for acute encephalopathy workup and acute liver failure workup.      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   June 2020 due to progression of M spike Revlimid was increased to 15 mg PO D1-21 every 28 days, plus dexamethasone  July 2021: started daratumumab, Pomylast 2 mg D 1-21, Q28 days, dexamethasone 12 mg PO Q weekly.   Primary oncologist: Dr. Iona Coach,  Dr. Kennon Rounds [facility]  Receiving dexamethasone 12 mg weekly for diarrhea  Receiving MT with daratumumab with pomalyst and dex, most recent dose 10/16/21, has since been on hold due to  worsening diarrhea.     DATA:   09/07/2010: IgG Kappa MM  12/02/2021: Ig M <5, IgG 447, IgA 20, Ig E <2, SFLC Kappa 16.6, Lambda 1.6, Kappa/Lamda 10.38  12/14/21: IgG K on IFE, IgG 430, KLC 16.6, LLC 2.7, KLR 6.15    Chemo:   Holding Daratumumab and Pomalyst, last dose 10/20/21, given recent infection and acute liver failure.    Supportive Care:   Transfusion: Keep Hg >7, and Platelets >10.   Per daughter, has autoantibodies in blood was a difficult match. Would like to be contacted prior to transfusion.   Line: PICC placed 1/26  Outpatient Oncologist: Dr. Kennon Rounds    Dispo: TBD after resolution of diarrhea and liver failure, likely transfer back to OSH once stabilized    # Immunocompromised  # CMV colitis  # At risk for infections due to malignancy  # Shock, likely hypovolemic vs distributive in the setting of chronic diarrhea, + norovirus infection   # Acute on chronic diarrhea, norovirus positive., diff and OP negative.   -No prior evidence of adrenal insufficiency, AM cortisol levels normal, previously on dexamethasone 8 mg PO Qweekly, but has been held since early December.    Data:  - CT brain: No acute intracranial hemorrhage, herniation, or hydrocephalus.  - CT chest/A/P pending: Medium-sized bilateral pleural effusions with bilateral peribronchovascular groundglass opacities, suggestive of pulmonary edema. Clustered nodules in right middle lobe along the right minor fissure, which could represent mild aspiration or infection. Numerous sclerotic and mixed lytic/sclerotic lesions throughout the thoracic spine and bilateral ribs, increased in size and number compared to prior. Appearance is atypical for multiple myeloma but may represent a combination of treated, partially treated, and active disease. No acute pathologic fracture. Findings compatible with enterocolitis of infectious or inflammatory etiology. Involvement of the colon is greater than of the small bowel. Marked hepatic steatosis. Increased  sclerosis of bone lesions throughout the thoracolumbar spine, bony pelvis, and proximal left femur. Appearance is atypical for multiple myeloma but may represent a combination of treated, partially treated, and active disease. No acute pathologic fracture.  - 1/29:CMV PCR [id number]  - 1/31: GI PCR panel positive only for Norovirus.   2/1: LP results: Neutrophils 6% in tube 1, Lymphocytes 88% (predominant), protein 42 (normal), glucose 36 (low), rapid HSV in CSF negative. CMV CSF PCR negative    - 2/2 CMV PCR 179,555High   - 2/2 Flex Sig Biopsies prelim: "colonic mucosa with a rare small non-necrotizing mucosal  granuloma and rare CMV immunolabeling"   - 2/5 Novelty work up: sIL2 (1556.6 high), ferritin (948 high), TG(258 high), NKA-IFN? 10 (WNL)  - 2/8: CMV PCR: 3107 (sig improvement)  - 2/10: C diff: negative.  - 2/12: Repeat GI viral PCR: positive for Norovirus  - 2/15 CMV 755  - 2/17 IgG pending    Plan:  - appreciate ID recs  -- Ganciclovir 5 mg/kg (1/31-). S/p one dose of IVIG on 2/7. Started on Alinia (nitazoxanide) for 3 days (2/16 - 2/18), prolonged to 1 week course given persistent diarrhea (to end 2/21)  -- retest Norovirus PCR prior to end of Alinia course (ordered for 2/21)  -- s/p IV Zosyn 4.5 g Q8hrs (1/26-1/28, restarted 1/29 - 2/1 for recurrent fever)  - GI consult: appreciate recs: flex sig 2/2  - Weekly CMV  - Rifaximin dose adjusted to cover for SIBO (2/14 - 2/16 400 mg, 550 mg TID 2/16->)  - OI ppx: Acyclovir IV    # Grade III neutropenia, at baseline  # Leukopenia  # Thrombocytopenia (possibly 2/2 liver disease)  Data: WBC of 2.2 on 12/05/21, on Zarxio at home  Plan: Continue to monitor    # Acute liver injury of unclear etiology  # Acute on chronic encephalopathy likely 2/2 infectious vs hepatic vs metabolic  # Hyperbilirubinemia  # Anasarca  # Hypoalbuminemia    Data:   -Steady rise in LFTs in 05/2021,  -12/02/21: AST 152--> 121, ALT 130--> 110, TB 5.4, Albumin 1.1  -Hepatitis panel negative.    -1/26:  Tbili 14.6, GGT 1160, ammonia 25  -CTAP 12/02/21: Scattered sclerotic lesions in the lumbar spine, hepatic steatosis.   -RUQ ultrasound: liver enlarged with diffuse increased echogenicity, consistent with fatty infiltration. 2cm stone in the gallbladder.   -HIDA scan: Normal radiotracer filling of the gallbladder is identified, therefore the exam is not consistent with cystic duct obstruction or acute cholecystitis.  - [place] abd 1/27: Hepatomegaly with diffusely increased echogenicity which can be seen with hepatic amyloidosis or steatosis. Please refer to the recently performed biopsy for further details. Mild perihepatic ascites. Patent hepatic vasculature with normal waveforms. Cholelithiasis without acute cholecystitis. Right pleural effusion.  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 CMV stains: Negative.  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 Could be drug related. Increase Ursodiol to 600 mg BID, Check Hepatitis E. 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 Unlikely diarrhea from Ursodiol.  - Avoiding steroids  - Daily LFT/INR  - RD consult placed to expand diet to more than protein shakes.  -- Family requesting appetite stimulant but risk > benefit.     # Acute toxic  encephalopathy, improving  In the setting of liver failure, ammonia only 25. LP performed 2/1 to r/o encephalitis.     Data:   1/31: HIV,  TSH, Folate, B12 WNL  1/30: CT head WNL other than sinus disease  2/1: LP results: Neutrophils 6% in tube 1, Lymphocytes 88% (predominant), protein 42 (normal), glucose 36 (low), rapid HSV in CSF negative. CMV CSF PCR negative  2/5: Brain MRI: Smooth diffuse dural thickening and enhancement as well as bilateral holohemispheric subdural effusions, which may be on the basis of diminished intracranial pressure following recent lumbar puncture.  -2/8: EEG: non-specific mild cerebral dysfunction.  -2/9: CT Brain: similar to prior MRI with hyperdense areas likely hygromas    Plan:  - Neurology consult: 2/7: Encephalopathy likely from underlying medical disease. Delirium prevention. Avoid Ativan and try Seroquel for delirium. MRI findings is from LP. Okay to try caffiene tablets in the daytime to help with postural headache but patient is allergic to it.  -2/8: Repeat CT head to monitor the status of subdural effusion: stable, EEG: diffuse slowing, Thiamine iv prophylaxis.  -Speech path: Regular and thin liquid. Slightly higher risk for aspiration given encephalopathy.  - Start empiric rifaximin given possibility of HE without elevated ammonia (2/14 ->)  -- dose adjusted as above to cover for SIBO  -- holding off on lactulose given diarrhea    # Deconditioning  Pt has not been engaging much with PT/OT despite improved mental status. May be due in part to anxieties about diarrhea/incontinence.  - continue PT/OT  - continue encouragement to participate, particularly by family/familiar faces    # Hypokalemia:  # Hypomagnesemia:  Replete as needed.    # Vitamin D deficiency:  -Supplements ordered.    # Moderate malnutrition:  -Seen by RD and given recs for supplements.    # History of PE, diagnosed in April 2019  - hold home [facility] for now given recent anemia and concerns for bleeding.   -  monitor for signs and symptoms of worsening bleeding.     # Elevated TSH at OSH, likely 2/2 subclinical hypothyroidism  -TSH WNL on 12/15/20.      # Anxiety, emotional distress:   -Patient has stressful social situation, intellectually disabled son, and regarding underlying disease process.     # Chronic buttocks pressure wound  - Wound care consult, appreciate recs  - Position changes as tolerated, waffle cushion, etc  -- Pressure wound ruled out, treating bilateral buttocks moisture and friction damage   - Rectal tube placed 2/18 to avoid contamination of wounds by stool    Wound care:  - Bilateral Groin/Thighs   Gently cleanse with warm water & Remedy no-rinse foam (pH-balanced cleanser) with soft cloths, pat dry ensuring   thoroughly dried.   Do not use CHG to rash areas.   Apply Clotrimazole ointment to rash BID, ensure thoroughly rubbed in.   Apply a piece of Interdry textile [phone]) leaving 2 inches exposed from fold to facilitate wicking.     - Bilateral Buttocks   First rinse with perineal irrigation bottle & warm water to remove as much stool as possible to minimize wiping.   Cleanse wounds & surrounding skin with Anasept wound cleanser spray ([id number]), pat dry with sterile gauze.   Apply a thick layer of Triad hydrophilic wound dressing paste ([id number]) over wounds & periwound areas.   Promptly clean patient after episodes of incontinence using method described.   Remove only the top, soiled layer of Triad during incontinence cleaning to avoid further eroding the skin & apply another top   layer of Triad following cleaning.   Apply Triad at least BID & PRN to  keep area protected.     - Pressure Injury Prevention   Continue q2h repositioning- 30 degree lateral turn to L & R only unless medically contraindicated to avoid direct pressure on   sacrum.   Place Waffle mattress overlay [phone]) under patient in bed to assist with pressure redistribution.   Use waffle cushion while OOB to chair,  reposition frequently while in chair every 30 minutes.   Additionally, float heels completely off of bed. If pillows do not suffice, order heel offloading boots (PMM# [id number]).     # VTE PPx:  Contraindicated: Other: anticipate Plt <50    Severity of Illness  Stable     During this hospitalization the patient is also being treated for:  - Sepsis (present on admission)    - Malignancy associated fatigue on admission  - Neoplasm/malignancy associated pain  - Hypoalbuminemia (present on admission)  - Fluid status: Hypovolemia on admission     - Cachexia  - Encephalopathy: Metabolic encephalopathy     Code Status: FULL    [name], MD  01/03/22

## 2022-01-04 LAB — PROTHROMBIN TIME
INR: 1.3 — ABNORMAL HIGH (ref 0.9–1.2)
PT: 15.9 s — ABNORMAL HIGH (ref 11.6–15.0)

## 2022-01-04 LAB — BASIC METABOLIC PANEL (NA, K, CL, CO2, BUN, CR, GLU, CA)
Anion Gap: <1 — ABNORMAL LOW (ref 4–14)
Anion Gap: 5 (ref 4–14)
Calcium, total, Serum / Plasma: 6.9 mg/dL — ABNORMAL LOW (ref 8.4–10.5)
Calcium, total, Serum / Plasma: 7.3 mg/dL — ABNORMAL LOW (ref 8.4–10.5)
Carbon Dioxide, Total: 20 mmol/L — ABNORMAL LOW (ref 22–29)
Carbon Dioxide, Total: 22 mmol/L (ref 22–29)
Chloride, Serum / Plasma: 129 mmol/L — ABNORMAL HIGH (ref 101–110)
Chloride, Serum / Plasma: 113 mmol/L — ABNORMAL HIGH (ref 101–110)
Creatinine: 0.33 mg/dL — ABNORMAL LOW (ref 0.55–1.02)
Creatinine: 0.32 mg/dL — ABNORMAL LOW (ref 0.55–1.02)
Glucose, non-fasting: 97 mg/dL (ref 70–199)
Glucose, non-fasting: 97 mg/dL (ref 70–199)
Potassium, Serum / Plasma: >10 mmol/L — ABNORMAL HIGH (ref 3.5–5.0)
Potassium, Serum / Plasma: 4 mmol/L (ref 3.5–5.0)
Sodium, Serum / Plasma: 128 mmol/L — ABNORMAL LOW (ref 135–145)
Sodium, Serum / Plasma: 140 mmol/L (ref 135–145)
Urea Nitrogen, Serum / Plasma: 3 mg/dL — ABNORMAL LOW (ref 7–25)
Urea Nitrogen, Serum / Plasma: 3 mg/dL — ABNORMAL LOW (ref 7–25)
eGFRcr: 118 mL/min/{1.73_m2} (ref >59)
eGFRcr: 119 mL/min/{1.73_m2} (ref >59)

## 2022-01-04 LAB — COMPREHENSIVE METABOLIC PANEL (BMP, AST, ALT, T.BILI, ALKP, TP ALB)
AST: 106 U/L — ABNORMAL HIGH (ref 5–44)
Alanine transaminase: 78 U/L — ABNORMAL HIGH (ref 10–61)
Albumin, Serum / Plasma: 1.2 g/dL — ABNORMAL LOW (ref 3.4–4.8)
Alkaline Phosphatase: 479 U/L — ABNORMAL HIGH (ref 38–108)
Anion Gap: 4 (ref 4–14)
Bilirubin, Total: 11.6 mg/dL — ABNORMAL HIGH (ref 0.2–1.2)
Calcium, total, Serum / Plasma: 7.1 mg/dL — ABNORMAL LOW (ref 8.4–10.5)
Carbon Dioxide, Total: 23 mmol/L (ref 22–29)
Chloride, Serum / Plasma: 111 mmol/L — ABNORMAL HIGH (ref 101–110)
Creatinine: 0.31 mg/dL — ABNORMAL LOW (ref 0.55–1.02)
Glucose, non-fasting: 93 mg/dL (ref 70–199)
Potassium, Serum / Plasma: 2.7 mmol/L — CL (ref 3.5–5.0)
Protein, Total, Serum / Plasma: 3.4 g/dL — ABNORMAL LOW (ref 6.3–8.6)
Sodium, Serum / Plasma: 138 mmol/L (ref 135–145)
Urea Nitrogen, Serum / Plasma: 2 mg/dL — ABNORMAL LOW (ref 7–25)
eGFRcr: 120 mL/min/{1.73_m2} (ref >59)

## 2022-01-04 LAB — COMPLETE BLOOD COUNT WITH DIFFERENTIAL
Abs Basophils: 0 10*9/L (ref 0.00–0.10)
Abs Eosinophils: 0 10*9/L (ref 0.00–0.40)
Abs Imm Granulocytes: 0.05 10*9/L (ref <0.10)
Abs Lymphocytes: 0.19 10*9/L — ABNORMAL LOW (ref 1.00–3.40)
Abs Monocytes: 0.09 10*9/L — ABNORMAL LOW (ref 0.20–0.80)
Abs Neutrophils: 3.65 10*9/L (ref 1.80–6.80)
Hematocrit: 21.9 % — ABNORMAL LOW (ref 36.0–46.0)
Hemoglobin: 7.4 g/dL — ABNORMAL LOW (ref 12.0–15.5)
MCH: 37.6 pg — ABNORMAL HIGH (ref 26.0–34.0)
MCHC: 33.8 g/dL (ref 31.0–36.0)
MCV: 111 fL — ABNORMAL HIGH (ref 80–100)
MPV: 12.7 fL — ABNORMAL HIGH (ref 9.1–12.6)
Platelet Count: 66 10*9/L — ABNORMAL LOW (ref 140–450)
RBC Count: 1.97 10*12/L — ABNORMAL LOW (ref 4.00–5.20)
RDW-CV: UNDETERMINED % (ref 11.7–14.4)
WBC Count: 4 10*9/L (ref 3.4–10.0)

## 2022-01-04 LAB — MAGNESIUM, SERUM / PLASMA: Magnesium, Serum / Plasma: 1.9 mg/dL (ref 1.6–2.6)

## 2022-01-04 LAB — BILIRUBIN, DIRECT: Bilirubin, Direct: 8.3 mg/dL — ABNORMAL HIGH (ref <0.6)

## 2022-01-04 LAB — FIBRINOGEN, FUNCTIONAL: Fibrinogen, Functional: 161 mg/dL — ABNORMAL LOW (ref 202–430)

## 2022-01-04 MED ORDER — POTASSIUM CHLORIDE 40 MEQ/100ML IN STERILE WATER INTRAVENOUS PIGGYBACK
40 | Freq: Once | INTRAVENOUS | Status: AC
Start: 2022-01-04 — End: 2022-01-04
  Administered 2022-01-04: 18:00:00 via INTRAVENOUS

## 2022-01-04 MED ORDER — POTASSIUM CHLORIDE 40 MEQ/100ML IN STERILE WATER INTRAVENOUS PIGGYBACK
40 | Freq: Once | INTRAVENOUS | Status: AC
Start: 2022-01-04 — End: 2022-01-04
  Administered 2022-01-04: 15:00:00 via INTRAVENOUS

## 2022-01-04 MED ORDER — POTASSIUM CHLORIDE 20 MEQ/50 ML IN STERILE WATER INTRAVENOUS PIGGYBACK
20 | Freq: Once | INTRAVENOUS | Status: AC
Start: 2022-01-04 — End: 2022-01-04
  Administered 2022-01-04: 22:00:00 via INTRAVENOUS

## 2022-01-04 MED FILL — NITAZOXANIDE 500 MG TABLET: 500 mg | ORAL | Qty: 1

## 2022-01-04 MED FILL — DILAUDID (PF) 0.5 MG/0.5 ML INJECTION SYRINGE: 0.5 mg/ mL | INTRAMUSCULAR | Qty: 0.5

## 2022-01-04 MED FILL — DICYCLOMINE 10 MG CAPSULE: 10 mg | ORAL | Qty: 2

## 2022-01-04 MED FILL — VITAMIN E (DL, ACETATE) 45 MG (100 UNIT) CAPSULE: 45 mg (100 unit) | ORAL | Qty: 8

## 2022-01-04 MED FILL — POTASSIUM CHLORIDE 40 MEQ/100ML IN STERILE WATER INTRAVENOUS PIGGYBACK: 40 mEq/100 mL | INTRAVENOUS | Qty: 100

## 2022-01-04 MED FILL — XIFAXAN 550 MG TABLET: 550 mg | ORAL | Qty: 1

## 2022-01-04 MED FILL — GANCICLOVIR SODIUM 500 MG INTRAVENOUS SOLUTION: 500 mg | INTRAVENOUS | Qty: 8

## 2022-01-04 MED FILL — HYDROXYZINE HCL 10 MG TABLET: 10 mg | ORAL | Qty: 1

## 2022-01-04 MED FILL — POTASSIUM CHLORIDE 20 MEQ/50 ML IN STERILE WATER INTRAVENOUS PIGGYBACK: 20 mEq/50 mL | INTRAVENOUS | Qty: 50

## 2022-01-04 MED FILL — THIAMINE HCL (VITAMIN B1) 100 MG/ML INJECTION SOLUTION: 100 mg/mL | INTRAMUSCULAR | Qty: 2

## 2022-01-04 MED FILL — URSODIOL (BULK) 100 % POWDER: 100 % | Qty: 0.6

## 2022-01-04 MED FILL — MAGNESIUM SULFATE 2 GRAM/50 ML (4 %) IN WATER INTRAVENOUS PIGGYBACK: 2 gram/50 mL (4 %) | INTRAVENOUS | Qty: 50

## 2022-01-04 MED FILL — ONDANSETRON HCL (PF) 4 MG/2 ML INJECTION SOLUTION: 4 mg/2 mL | INTRAMUSCULAR | Qty: 4

## 2022-01-04 MED FILL — FLINTSTONES COMPLETE (IRON) CHEWABLE TABLET: ORAL | Qty: 1

## 2022-01-04 NOTE — Progress Notes (Signed)
[facility] PROGRESS NOTE  ATTENDING ONLY     My date of service is 01/04/22.    24 Hour Course  Alinia course extended due to ongoing diarrhea.  6:26 AM: K+ 2.7, repleted 100 meq of KCL.      Subjective  Denies pain currently. Thinks the diarrhea remains unchanged but is not sure if she had diarrhea last night or this morning. When asked about mobilizing, said she has not gotten in the chair recently but seemed interested in moving around in a wheelchair.    Vitals  Temp:  [36.5 C (97.7 F)-36.9 C (98.4 F)] 36.7 C (98.1 F)  Heart Rate:  [72-85] 72  *Resp:  [16-18] 18  BP: (95-107)/(63-80) 95/63  SpO2:  [94 %-96 %] 94 %    MostRecent Weight: 88.5 kg (195 lb 1.7 oz)  Admission Weight: 79.6 kg (175 lb 7.8 oz)      Intake/Output Summary (Last 24 hours) at 01/04/2022 6295  Last data filed at 01/04/2022 0500  Gross per 24 hour   Intake 2850 ml   Output 2525 ml   Net 325 ml     Pain Score: 0    Physical Exam  Vitals reviewed.   Constitutional:       Appearance: She is ill-appearing.   HENT:      Head: Normocephalic and atraumatic.      Nose: Nose normal.      Mouth/Throat:      Mouth: Mucous membranes are moist.      Pharynx: Oropharynx is clear.   Eyes:      General: Scleral icterus present.      Extraocular Movements: Extraocular movements intact.      Pupils: Pupils are equal, round, and reactive to light.   Cardiovascular:      Rate and Rhythm: Normal rate and regular rhythm.      Pulses: Normal pulses.      Heart sounds: Normal heart sounds.   Pulmonary:      Effort: Pulmonary effort is normal.      Breath sounds: Normal breath sounds.   Abdominal:      General: Abdomen is flat. Bowel sounds are normal.      Palpations: Abdomen is soft.   Musculoskeletal:         General: No swelling. Normal range of motion.      Cervical back: Normal range of motion and neck supple.      Right lower leg: No edema.      Left lower leg: No edema.   Skin:     General: Skin is warm and dry.      Capillary Refill:  Capillary refill takes less than 2 seconds.      Coloration: Skin is jaundiced (improving).   Neurological:      General: No focal deficit present.      Mental Status: She is alert and oriented to person, place, and time.   Psychiatric:         Mood and Affect: Mood normal.         Behavior: Behavior is slowed.     Scheduled Meds:   0.9% sodium chloride flush  3 mL Intravenous Q12H [name]    clotrimazole   Topical BID SCH    dicyclomine  20 mg Oral 4x Daily [place]    dry mouth oral rinse  5 mL Mucous Membrane 4x Daily [place]    ergocalciferol (vitamin D2)  50,000 Units Oral Q7 Days [facility]  ganciclovir (CYTOVENE) IVPB  5 mg/kg (Dosing Weight) Intravenous Q12H [place]    multivitamin complete chewable  1 tablet Oral Daily [place]    nitazoxanide  500 mg Oral Q12H [place]    potassium chloride in sterile water  40 mEq Intravenous Once    Followed by    potassium chloride in sterile water  20 mEq Intravenous Once    QUEtiapine  50 mg Oral Once    rifAXIMin  550 mg Oral TID [facility]    thiamine  200 mg Intravenous Q24H    ursodiol  600 mg Oral BID [place]    vitamin E (dl, acetate)  800 Units Oral Daily [place]     Continuous Infusions:   dextrose 5 % and 0.9 % sodium chloride 75 mL/hr (01/03/22 2114)     PRN Meds:   0.9% sodium chloride flush  3 mL Intravenous PRN    albuterol  2.5 mg Nebulization Q4H PRN    Or    albuterol  2.5 mg Nebulization Q4H PRN    HYDROmorphone  0.3-0.8 mg Intravenous Q4H PRN    hydrOXYzine  10 mg Oral Q8H PRN    loperamide  4 mg Oral 4x Daily PRN    magnesium sulfate in dextrose 5 %  2-4 g Intravenous Daily PRN    Or    magnesium sulfate in water  2-4 g Intravenous Daily PRN    ondansetron  8 mg Intravenous Q8H PRN    polyethylene glycol  17 g Oral Daily PRN    potassium chloride in sterile water  20-80 mEq Intravenous Daily PRN    Or    potassium chloride in sterile water  20-80 mEq Intravenous Daily PRN    Or    potassium chloride  20-80 mEq Oral Daily PRN     Data    CBC        01/04/22  0510  01/03/22  0426   WBC 4.0 3.5   HGB 7.4* 7.3*   HCT 21.9* 21.8*   PLT 66* 67*     Coags        01/04/22  0510 01/03/22  0426   INR 1.3* 1.4*     Chem7        01/04/22  0510 01/03/22  1649 01/03/22  1502 01/03/22  0426   NA 138 140 128* 139   K 2.7* 4.0 >10.0* 3.2*   CL 111* 113* 129* 112*   CO2 23 22 20* 22   BUN 2* 3* 3* 3*   CREAT 0.31* 0.32* 0.33* 0.31*   GLU 93 97 97 83     Electrolytes        01/04/22  0510 01/03/22  1649 01/03/22  1502 01/03/22  0426   CA 7.1* 7.3* 6.9* 7.2*   MG 1.9  --   --  1.7     Liver Panel        01/04/22  0510 01/03/22  0426   AST 106* 116*   ALT 78* 79*   ALKP 479* 497*   TBILI 11.6* 11.6*   TP 3.4* 3.3*   ALB 1.2* 1.2*     Lactate  No results found in last 36 hours    Microbiology Results (last 24 hours)     ** No results found for the last 24 hours. **        Radiology Results  No results found.     I discussed the patient with Carmin Muskrat, MD from [facility] regarding  A&P.    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 The patient has received VRd X 4 (achieving VGPR) then Mel 200 ASCT 05/12/11. 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 She met the criteria for PD on 02/19/2020 and was started on third line DARA PD in June 2021, which was recently held on 10/20/21 due to chronic diarrhea. Patient was transferred OSH after recent hypovolemic/distributive shock. Here for acute encephalopathy workup and acute liver failure workup.      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   June 2020 due to progression of M spike Revlimid was increased to 15 mg PO D1-21 every 28 days, plus dexamethasone  July 2021: started daratumumab, Pomylast 2 mg D 1-21, Q28 days, dexamethasone 12 mg PO Q  weekly.   Primary oncologist: Dr. Iona Coach,  Dr. Kennon Rounds  hematology  Receiving dexamethasone 12 mg weekly for diarrhea  Receiving MT with daratumumab with pomalyst and dex, most recent dose 10/16/21, has since been on hold due to worsening diarrhea.     DATA:   09/07/2010: IgG Kappa MM  12/02/2021: Ig M <5, IgG 447, IgA 20, Ig E <2, SFLC Kappa 16.6, Lambda 1.6, Kappa/Lamda 10.38  12/14/21: IgG K on IFE, IgG 430, KLC 16.6, LLC 2.7, KLR 6.15    Chemo:   Holding Daratumumab and Pomalyst, last dose 10/20/21, given recent infection and acute liver failure.    Supportive Care:   Transfusion: Keep Hg >7, and Platelets >10.   Per daughter, has autoantibodies in blood was a difficult match. Would like to be contacted prior to transfusion.   Line: PICC placed 1/26  Outpatient Oncologist: Dr. Kennon Rounds    Dispo: TBD after resolution of diarrhea and liver failure, likely transfer back to OSH once stabilized    # Immunocompromised  # CMV colitis  # At risk for infections due to malignancy  # Shock, likely hypovolemic vs distributive in the setting of chronic diarrhea, + norovirus infection   # Acute on chronic diarrhea, norovirus positive., diff and OP negative.   -No prior evidence of adrenal insufficiency, AM cortisol levels normal, previously on dexamethasone 8 mg PO Qweekly, but has been held since early December.    Data:  - CT brain: No acute intracranial hemorrhage, herniation, or hydrocephalus.  - CT chest/A/P pending: Medium-sized bilateral pleural effusions with bilateral peribronchovascular groundglass opacities, suggestive of pulmonary edema. Clustered nodules in right middle lobe along the right minor fissure, which could represent mild aspiration or infection. Numerous sclerotic and mixed lytic/sclerotic lesions throughout the thoracic spine and bilateral ribs, increased in size and number compared to prior. Appearance is atypical for multiple myeloma but may represent a combination of treated, partially  treated, and active disease. No acute pathologic fracture. Findings compatible with enterocolitis of infectious or inflammatory etiology. Involvement of the colon is greater than of the small bowel. Marked hepatic steatosis. Increased sclerosis of bone lesions throughout the thoracolumbar spine, bony pelvis, and proximal left femur. Appearance is atypical for multiple myeloma but may represent a combination of treated, partially treated, and active disease. No acute pathologic fracture.  - 1/29:CMV PCR 892,[id number]  - 1/31: GI PCR panel positive  only for Norovirus.   2/1: LP results: Neutrophils 6% in tube 1, Lymphocytes 88% (predominant), protein 42 (normal), glucose 36 (low), rapid HSV in CSF negative. CMV CSF PCR negative    - 2/2 CMV PCR 179,555High   - 2/2 Flex Sig Biopsies prelim: "colonic mucosa with a rare small non-necrotizing mucosal granuloma and rare CMV immunolabeling"   - 2/5 [place] work up: sIL2 (1556.6 high), ferritin (948 high), TG(258 high), NKA-IFN? 10 (WNL)  - 2/8: CMV PCR: 3107 (sig improvement)  - 2/10: C diff: negative.  - 2/12: Repeat GI viral PCR: positive for Norovirus  - 2/15 CMV 755  - 2/17 IgG pending    Plan:  - appreciate ID recs  -- Ganciclovir 5 mg/kg (1/31-). S/p one dose of IVIG on 2/7. Started on Alinia (nitazoxanide) for 3 days (2/16 - 2/18), prolonged to 1 week course given persistent diarrhea (to end 2/21)  -- retest Norovirus PCR prior to end of Alinia course (ordered for 2/21)  -- s/p IV Zosyn 4.5 g Q8hrs (1/26-1/28, restarted 1/29 - 2/1 for recurrent fever)  - GI consult: appreciate recs: flex sig 2/2  - Weekly CMV  - Rifaximin dose adjusted to cover for SIBO (2/14 - 2/16 400 mg, 550 mg TID 2/16->)  - OI ppx: Acyclovir IV    # Grade III neutropenia, at baseline  # Leukopenia  # Thrombocytopenia (possibly 2/2 liver disease)  Data: WBC of 2.2 on 12/05/21, on Zarxio at home  Plan: Continue to monitor    # Acute liver injury of unclear etiology  # Acute on chronic  encephalopathy likely 2/2 infectious vs hepatic vs metabolic  # Hyperbilirubinemia  # Anasarca  # Hypoalbuminemia    Data:   -Steady rise in LFTs in 05/2021,  -12/02/21: AST 152--> 121, ALT 130--> 110, TB 5.4, Albumin 1.1  -Hepatitis panel negative.   -1/26:  Tbili 14.6, GGT 1160, ammonia 25  -CTAP 12/02/21: Scattered sclerotic lesions in the lumbar spine, hepatic steatosis.   -RUQ ultrasound: liver enlarged with diffuse increased echogenicity, consistent with fatty infiltration. 2cm stone in the gallbladder.   -HIDA scan: Normal radiotracer filling of the gallbladder is identified, therefore the exam is not consistent with cystic duct obstruction or acute cholecystitis.  - [place] abd 1/27: Hepatomegaly with diffusely increased echogenicity which can be seen with hepatic amyloidosis or steatosis. Please refer to the recently performed biopsy for further details. Mild perihepatic ascites. Patent hepatic vasculature with normal waveforms. Cholelithiasis without acute cholecystitis. Right pleural effusion.  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 CMV stains: Negative.  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 Could be drug related. Increase Ursodiol to 600 mg BID, Check Hepatitis E. 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 Unlikely diarrhea from Ursodiol.  - Avoiding steroids  - Daily LFT/INR  -  RD consult placed to expand diet to more than protein shakes.  -- Family requesting appetite stimulant but risk > benefit.     # Acute toxic encephalopathy, improving  In the setting of liver failure, ammonia only 25. LP performed 2/1 to r/o encephalitis.     Data:   1/31: HIV, TSH, Folate, B12 WNL  1/30: CT head WNL other than sinus disease  2/1: LP results: Neutrophils 6% in tube 1, Lymphocytes 88% (predominant), protein 42 (normal), glucose 36 (low), rapid HSV in CSF negative. CMV CSF PCR negative  2/5: Brain MRI: Smooth diffuse dural thickening and enhancement as well as bilateral holohemispheric subdural effusions, which may be on the basis of diminished intracranial pressure following recent lumbar puncture.  -2/8: EEG: non-specific mild cerebral dysfunction.  -2/9: CT Brain: similar to prior MRI with hyperdense areas likely hygromas    Plan:  - Neurology consult: 2/7: Encephalopathy likely from underlying medical disease. Delirium prevention. Avoid Ativan and try Seroquel for delirium. MRI findings is from LP. Okay to try caffiene tablets in the daytime to help with postural headache but patient is allergic to it.  -2/8: Repeat CT head to monitor the status of subdural effusion: stable, EEG: diffuse slowing, Thiamine iv prophylaxis.  -Speech path: Regular and thin liquid. Slightly higher risk for aspiration given encephalopathy.  - Start empiric rifaximin given possibility of HE without elevated ammonia (2/14 ->)  -- dose adjusted as above to cover for SIBO  -- holding off on lactulose given diarrhea    # Deconditioning  Pt has not been engaging much with PT/OT despite improved mental status. May be due in part to anxieties about diarrhea/incontinence.  - continue PT/OT  - continue encouragement to participate, particularly by family/familiar faces    # Hypokalemia:  # Hypomagnesemia:  Replete as  needed.    # Vitamin D deficiency:  -Supplements ordered.    # Moderate malnutrition:  -Seen by RD and given recs for supplements.    # History of PE, diagnosed in April 2019  - hold home [place] for now given recent anemia and concerns for bleeding.   - monitor for signs and symptoms of worsening bleeding.     # Elevated TSH at OSH, likely 2/2 subclinical hypothyroidism  -TSH WNL on 12/15/20.      # Anxiety, emotional distress:   -Patient has stressful social situation, intellectually disabled son, and regarding underlying disease process.     # Chronic buttocks pressure wound  - Wound care consult, appreciate recs  - Position changes as tolerated, waffle cushion, etc  -- Pressure wound ruled out, treating bilateral buttocks moisture and friction damage   - Rectal tube placed 2/18 to avoid contamination of wounds by stool    Wound care:  - Bilateral Groin/Thighs   Gently cleanse with warm water & Remedy no-rinse foam (pH-balanced cleanser) with soft cloths, pat dry ensuring   thoroughly dried.   Do not use CHG to rash areas.   Apply Clotrimazole ointment to rash BID, ensure thoroughly rubbed in.   Apply a piece of Interdry textile [phone] leaving 2 inches exposed from fold to facilitate wicking.     - Bilateral Buttocks   First rinse with perineal irrigation bottle & warm water to remove as much stool as possible to minimize wiping.   Cleanse wounds & surrounding skin with Anasept wound cleanser spray (OFB#[id number]), pat dry with sterile gauze.   Apply a thick layer of Triad hydrophilic wound dressing paste (PMM#[id number]) over wounds &  periwound areas.   Promptly clean patient after episodes of incontinence using method described.   Remove only the top, soiled layer of Triad during incontinence cleaning to avoid further eroding the skin & apply another top   layer of Triad following cleaning.   Apply Triad at least BID & PRN to keep area protected.     - Pressure Injury Prevention   Continue q2h repositioning-  30 degree lateral turn to L & R only unless medically contraindicated to avoid direct pressure on   sacrum.   Place Waffle mattress overlay [phone] under patient in bed to assist with pressure redistribution.   Use waffle cushion while OOB to chair, reposition frequently while in chair every 30 minutes.   Additionally, float heels completely off of bed. If pillows do not suffice, order heel offloading boots (PMM# [id number]).     # VTE PPx:  Contraindicated: Other: anticipate Plt <50    Severity of Illness  Stable     During this hospitalization the patient is also being treated for:  - Sepsis (present on admission)    - Malignancy associated fatigue on admission  - Neoplasm/malignancy associated pain  - Hypoalbuminemia (present on admission)  - Fluid status: Hypovolemia on admission     - Cachexia  - Encephalopathy: Metabolic encephalopathy     Code Status: FULL    [name], MD  01/04/22

## 2022-01-05 LAB — BASIC METABOLIC PANEL (NA, K, CL, CO2, BUN, CR, GLU, CA)
Anion Gap: 4 (ref 4–14)
Calcium, total, Serum / Plasma: 7.3 mg/dL — ABNORMAL LOW (ref 8.4–10.5)
Carbon Dioxide, Total: 22 mmol/L (ref 22–29)
Chloride, Serum / Plasma: 112 mmol/L — ABNORMAL HIGH (ref 101–110)
Creatinine: 0.31 mg/dL — ABNORMAL LOW (ref 0.55–1.02)
Glucose, non-fasting: 94 mg/dL (ref 70–199)
Potassium, Serum / Plasma: 3.6 mmol/L (ref 3.5–5.0)
Sodium, Serum / Plasma: 138 mmol/L (ref 135–145)
Urea Nitrogen, Serum / Plasma: 2 mg/dL — ABNORMAL LOW (ref 7–25)
eGFRcr: 120 mL/min/{1.73_m2} (ref >59)

## 2022-01-05 LAB — COMPLETE BLOOD COUNT WITH DIFFERENTIAL
Abs Basophils: 0 10*9/L (ref 0.00–0.10)
Abs Eosinophils: 0 10*9/L (ref 0.00–0.40)
Abs Imm Granulocytes: 0.03 10*9/L (ref <0.10)
Abs Lymphocytes: 0.24 10*9/L — ABNORMAL LOW (ref 1.00–3.40)
Abs Monocytes: 0.11 10*9/L — ABNORMAL LOW (ref 0.20–0.80)
Abs Neutrophils: 3.43 10*9/L (ref 1.80–6.80)
Hematocrit: 22.7 % — ABNORMAL LOW (ref 36.0–46.0)
Hemoglobin: 7.5 g/dL — ABNORMAL LOW (ref 12.0–15.5)
MCH: 37.1 pg — ABNORMAL HIGH (ref 26.0–34.0)
MCHC: 33 g/dL (ref 31.0–36.0)
MCV: 112 fL — ABNORMAL HIGH (ref 80–100)
MPV: 13.4 fL — ABNORMAL HIGH (ref 9.1–12.6)
Platelet Count: 71 10*9/L — ABNORMAL LOW (ref 140–450)
RBC Count: 2.02 10*12/L — ABNORMAL LOW (ref 4.00–5.20)
RDW-CV: UNDETERMINED % (ref 11.7–14.4)
WBC Count: 3.8 10*9/L (ref 3.4–10.0)

## 2022-01-05 LAB — COMPREHENSIVE METABOLIC PANEL (BMP, AST, ALT, T.BILI, ALKP, TP ALB)
AST: 97 U/L — ABNORMAL HIGH (ref 5–44)
Alanine transaminase: 75 U/L — ABNORMAL HIGH (ref 10–61)
Albumin, Serum / Plasma: 1.2 g/dL — ABNORMAL LOW (ref 3.4–4.8)
Alkaline Phosphatase: 457 U/L — ABNORMAL HIGH (ref 38–108)
Anion Gap: 5 (ref 4–14)
Bilirubin, Total: 11.7 mg/dL — ABNORMAL HIGH (ref 0.2–1.2)
Calcium, total, Serum / Plasma: 7.1 mg/dL — ABNORMAL LOW (ref 8.4–10.5)
Carbon Dioxide, Total: 22 mmol/L (ref 22–29)
Chloride, Serum / Plasma: 112 mmol/L — ABNORMAL HIGH (ref 101–110)
Creatinine: 0.31 mg/dL — ABNORMAL LOW (ref 0.55–1.02)
Glucose, non-fasting: 92 mg/dL (ref 70–199)
Potassium, Serum / Plasma: 3.2 mmol/L — ABNORMAL LOW (ref 3.5–5.0)
Protein, Total, Serum / Plasma: 3.3 g/dL — ABNORMAL LOW (ref 6.3–8.6)
Sodium, Serum / Plasma: 139 mmol/L (ref 135–145)
Urea Nitrogen, Serum / Plasma: 2 mg/dL — ABNORMAL LOW (ref 7–25)
eGFRcr: 120 mL/min/{1.73_m2} (ref >59)

## 2022-01-05 LAB — MAGNESIUM, SERUM / PLASMA: Magnesium, Serum / Plasma: 2 mg/dL (ref 1.6–2.6)

## 2022-01-05 LAB — PROTHROMBIN TIME
INR: 1.4 — ABNORMAL HIGH (ref 0.9–1.2)
PT: 16.3 s — ABNORMAL HIGH (ref 11.6–15.0)

## 2022-01-05 LAB — FIBRINOGEN, FUNCTIONAL: Fibrinogen, Functional: 170 mg/dL — ABNORMAL LOW (ref 202–430)

## 2022-01-05 LAB — IGG, SERUM: IgG, serum: 745 mg/dL (ref 672–1760)

## 2022-01-05 LAB — BILIRUBIN, DIRECT: Bilirubin, Direct: 8.2 mg/dL — ABNORMAL HIGH (ref <0.6)

## 2022-01-05 MED ORDER — MELATONIN 5 MG TABLET
5 mg | Freq: Every day | ORAL | Status: DC
Start: 2022-01-05 — End: 2022-01-15
  Administered 2022-01-09 – 2022-01-15 (×3): 6 mg via ORAL

## 2022-01-05 MED ORDER — VALGANCICLOVIR 450 MG TABLET
450 | Freq: Two times a day (BID) | ORAL | Status: DC
Start: 2022-01-05 — End: 2022-01-07
  Administered 2022-01-06 – 2022-01-07 (×4): via ORAL

## 2022-01-05 MED ORDER — POTASSIUM CHLORIDE ER 10 MEQ TABLET,EXTENDED RELEASE (~~LOC~~ WRAPPER)
10 | Freq: Once | ORAL | Status: AC
Start: 2022-01-05 — End: 2022-01-05
  Administered 2022-01-06: 04:00:00 via ORAL

## 2022-01-05 MED FILL — GANCICLOVIR SODIUM 500 MG INTRAVENOUS SOLUTION: 500 mg | INTRAVENOUS | Qty: 8

## 2022-01-05 MED FILL — XIFAXAN 550 MG TABLET: 550 mg | ORAL | Qty: 1

## 2022-01-05 MED FILL — DICYCLOMINE 10 MG CAPSULE: 10 mg | ORAL | Qty: 2

## 2022-01-05 MED FILL — URSODIOL (BULK) 100 % POWDER: 100 % | Qty: 0.6

## 2022-01-05 MED FILL — POTASSIUM CHLORIDE 40 MEQ/100ML IN STERILE WATER INTRAVENOUS PIGGYBACK: 40 mEq/100 mL | INTRAVENOUS | Qty: 100

## 2022-01-05 MED FILL — VITAMIN E (DL, ACETATE) 45 MG (100 UNIT) CAPSULE: 45 mg (100 unit) | ORAL | Qty: 8

## 2022-01-05 MED FILL — NITAZOXANIDE 500 MG TABLET: 500 mg | ORAL | Qty: 1

## 2022-01-05 MED FILL — THIAMINE HCL (VITAMIN B1) 100 MG/ML INJECTION SOLUTION: 100 mg/mL | INTRAMUSCULAR | Qty: 2

## 2022-01-05 MED FILL — HYDROXYZINE HCL 10 MG TABLET: 10 mg | ORAL | Qty: 1

## 2022-01-05 MED FILL — FLINTSTONES COMPLETE (IRON) CHEWABLE TABLET: ORAL | Qty: 1

## 2022-01-05 MED FILL — DILAUDID (PF) 0.5 MG/0.5 ML INJECTION SYRINGE: 0.5 mg/ mL | INTRAMUSCULAR | Qty: 0.5

## 2022-01-05 NOTE — Consults (Signed)
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 The patient has received VRd X 4 (achieving VGPR) then Mel 200 ASCT 05/12/11. 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 She met the criteria for PD on 02/19/2020 and was started on third line DARA PD in June 2021, which was recently held on 10/20/21 due to chronic diarrhea. Patient was transferred OSH after recent hypovolemic/distributive shock. Here for acute encephalopathy workup and acute liver failure workup.       In-House Nutrition Orders:  Regular Diet         Anthropometric Data and Assessment:  Height: 153 cm (5' 0.24")  Ideal Body Weight: 45.99 kg    Usual body weight:90.91 kg (verified by pt's daughter)   Admit weight: 79.6 kg (175 lb 7.8 oz) (12/09/21 2356) (Unknown)  Current weight: 90.9 kg (200 lb 6.4 oz) (01/05/22 0500) (Bed scale)    Calculation Weight: 79.6 kg (Percent IBW (calculated) (%): 173)  Calc weight BMI: 34 kg/m^2  Alternative Weight (kg): 56.75 kg    Wt Readings from Last 10 Encounters:   01/05/22 90.9 kg (200 lb 6.4 oz)   12/12/18 96.3 kg (212 lb 4.9 oz)   08/08/18 98.9 kg (218 lb 1.6 oz)   05/23/18 98.7 kg (217 lb 9.6 oz)   04/11/18 98.5 kg (217 lb 1.6 oz)   01/10/18 98.4 kg (217 lb)   09/16/17 96.6 kg (213 lb)   06/21/17 96.4 kg (212 lb 9.6 oz)   03/15/17 95.6 kg (210 lb 12.8 oz)   12/14/16 94.7 kg (208 lb 12.8 oz)       Total weight change: 5.3 kg (6.19 %)  Time frame for weight change: 1 week    Assessment of weight change:    Using pt's last recorded UBW and admit weight, pt has lost 11 kg, 12% weight in the last 6-7 months, c/w severe weight loss. In-house weights likely influenced by fluid shifts given pt is edematous.    Nutrition-focused physical findings:   Visual Assessment: c/w BMI  Physical Exam (Date 2/21)  Muscles  Temples: Well-nourished: Can see/feel well-defined muscle  Clavicle: Well-nourished: Not visible in  female, visible but not prominent in female  Shoulders: Well-nourished: Rounded, curves at arm/shoulder/neck  Interosseous Muscles: Well-nourished: Muscle bulges, could be flat in some well-nourished people  Adipose  Orbital: Well-nourished: Slightly bulged fat pads. Fluid retention may mask loss  Triceps: Well-nourished: Ample fat tissue obvious between folds of skin    Edema (per RN assessment):   Generalized Edema: Moderate pitting, indentation subsides rapidly (01/04/22 2030)  RUE Edema: Moderate pitting, indentation subsides rapidly (01/03/22 0845)  LUE Edema: Moderate pitting, indentation subsides rapidly (01/03/22 0845)  RLE Edema: Deep pitting, indentation remains for a short time (01/04/22 2030)  LLE Edema: Deep pitting, indentation remains for a short time (01/04/22 2030)  Perineal: Non-pitting (01/04/22 2030)    Cognition (per RN assessment):  Level of Consciousness: Alert, Awake  Orientation Level: (S) Disoriented to situation, Disoriented to place, Disoriented to time, Oriented to person  Cognition: Short Term Memory Loss  Speech: Clear    GI Findings and Assessment: 1-3 loose Bm for last 3 days, down from >5 Bm per day  Mouth: Dry mouth   GI: Appetite, decrease, Diarrhea, Nausea   Per RN assessment:   Gastrointestinal  *Gastrointestinal (WDL): Exceptions to WDL (incontinent at times)  Mucous Membrane(s): Intact,  Dry  Abdomen Inspection: Distended, Rounded  Bowel Sounds (All Quadrants): Active  GI Symptoms: Loss of appetite, Other (Comment) (loose stools)  Relieved By: Medication (see MAR)    Respiratory support: None (Room air) (01/05/22 1504)    Lines, Drains, Airway, Wounds:   Patient Lines/Drains/Airways Status     Active LDAs     Name Placement date Placement time Site Days    PICC Double Lumen 12/10/21 Power Central Left;Upper Arm 1:Purple 2:Red 7 cm 12/10/21  1534  Arm  26    Indwelling Urinary Catheter 12/19/21 16 12/19/21  0550  --  17    Wound 12/19/21 Skin tear Sacrum 12/19/21  1900  Sacrum  16               Food and Nutrition Intake:   Information obtained from: Patient  Percentage of meals eaten for the past 168 hrs:   Percent Meals Eaten (%)   01/05/22 1506 100 %   01/05/22 1000 10 %   01/05/22 0900 100 %   01/04/22 2300 25 %   01/04/22 1515 30 %   01/03/22 2030 10 %   01/03/22 1522 50 %   01/03/22 1130 0 %   01/03/22 0419 0 %   01/02/22 2300 0 %   01/02/22 2034 25 %   01/02/22 1400 50 %   01/02/22 1000 0 %   01/01/22 2100 30 %   01/01/22 1200 50 %   01/01/22 0900 0 %   12/31/21 1430 20 %   12/31/21 0850 0 %   12/30/21 2000 25 %   12/30/21 1306 100 %   12/30/21 0922 100 %   12/30/21 0403 0 %   12/29/21 1930 25 %       Oral Food and Fluid Intake  Meal/Snack Pattern: 2 meals per day, 3 meals per day  Meal Intake Percentage: <50% of meals  Poor Meal Intake Assessment Period: greater than or equal to 7 days    Diet Recall: Other (comment)  Diet Recall Details: For the past week patient is ordering 2-3 meals per day. Breakfast order sent has been 1 Boost Plus. Patient has 12 unopened Boost at bed side suggesting she is not drinking. She orders multiple items per meal because she is not sure what she will tolerate. She states she has mostly been eating soup, juk, vanilla smoothies, and tuna sandwiches. She is getting fatigued of items like tuna and mashed potatoes.      Assessment of energy/protein intake: Suboptimal protein/energy intake  Intake met what percentage of estimated needs?: 25 - 50% of estimated needs  Duration of sub-optimal intake: greater than or equal to 7 days     Barriers to adequate nutrient intake: Altered mental status, Poor appetite, Nutrient/texture restriction, Unable to tolerate PO intake  Reports difficulty with dry foods. Limiting self to moist items. Tired of menu    Significant Labs:   Biochemical:  Lab Results   Component Value Date    Sodium, Serum / Plasma 139 01/05/2022    Potassium, Serum / Plasma 3.2 (L) 01/05/2022    Chloride, Serum / Plasma 112 (H) 01/05/2022    Carbon  Dioxide, Total 22 01/05/2022    Urea Nitrogen, Serum / Plasma 2 (L) 01/05/2022    Creatinine 0.31 (L) 01/05/2022    eGFRcr 120 01/05/2022    Glucose 101 (H) 05/10/2019    Glucose, non-fasting 92 01/05/2022    Calcium, total, Serum / Plasma 7.1 (L) 01/05/2022  Magnesium, Serum / Plasma 2.0 01/05/2022    Phosphorus, Serum / Plasma 2.8 12/23/2021     Lab Results   Component Value Date    Alanine transaminase 75 (H) 01/05/2022    AST 97 (H) 01/05/2022    Alkaline Phosphatase 457 (H) 01/05/2022    Bilirubin, Direct 8.2 (H) 01/05/2022    Bilirubin, Total 11.7 (H) 01/05/2022    Gamma-Glutamyl Transpeptidase 1,160 (H) 12/10/2021    Triglycerides, serum 259 (H) 12/20/2021    PT 16.3 (H) 01/05/2022    Ammonia 26 12/15/2021     No results found for: LACTWB  Lab Results   Component Value Date    Hemoglobin 7.5 (L) 01/05/2022    MCV 112 (H) 01/05/2022    Abs Neutrophils 3.43 01/05/2022     Lab Results   Component Value Date    Glucose, Glucometer 89 12/30/2021    Glucose, Glucometer 99 12/29/2021    Glucose, Glucometer 105 12/29/2021    Glucose, Glucometer 84 12/28/2021    Glucose, Glucometer 88 12/28/2021    Glucose, Glucometer 93 12/27/2021    Glucose, Glucometer 81 12/27/2021    Glucose, Glucometer 102 12/26/2021    Glucose, Glucometer 86 12/26/2021    Glucose, Glucometer 88 12/25/2021     Micronutrient Profile:  Lab Results   Component Value Date    Vitamin D, 25-Hydroxy 6 (L) 12/23/2021    Ferritin, Serum/Plasma 948 (H) 12/20/2021    Iron, serum 60 12/11/2021    Transferrin, Serum/Plasma 46 (L) 12/11/2021    % Saturation 93 (H) 12/11/2021    Vitamin B12 871 (H) 12/15/2021    Folate, serum 3.9 (L) 12/15/2021     Inflammatory profile:   Lab Results   Component Value Date    C-Reactive Protein 11.3 (H) 12/13/2021    Albumin, Serum / Plasma 1.2 (L) 01/05/2022    WBC Count 3.8 01/05/2022     Stool studies:  Lab Results   Component Value Date    Clostridium difficile Toxin gene: Not detected 12/25/2021    Clostridium difficile  Comment: Negative test 12/25/2021       Significant Meds:   Scheduled Meds:   0.9% sodium chloride flush  3 mL Intravenous Q12H [place]    clotrimazole   Topical BID SCH    dicyclomine  20 mg Oral 4x Daily [place]    dry mouth oral rinse  5 mL Mucous Membrane 4x Daily [place]    ergocalciferol (vitamin D2)  50,000 Units Oral Q7 Days [place]    melatonin  6 mg Oral Daily At Bedtime [facility]    multivitamin complete chewable  1 tablet Oral Daily [place]    nitazoxanide  500 mg Oral Q12H [place]    QUEtiapine  50 mg Oral Once    rifAXIMin  550 mg Oral TID [facility]    thiamine  200 mg Intravenous Q24H    ursodiol  600 mg Oral BID [facility]    valGANciclovir  900 mg Oral BID [place]    vitamin E (dl, acetate)  800 Units Oral Daily [place]     Continuous Infusions:   dextrose 5 % and 0.9 % sodium chloride 75 mL/hr (01/05/22 1506)     PRN Meds:.0.9% sodium chloride flush, albuterol **OR** albuterol, HYDROmorphone, hydrOXYzine, loperamide, magnesium sulfate in dextrose 5 % **OR** magnesium sulfate in water, ondansetron, polyethylene glycol, potassium chloride in sterile water **OR** potassium chloride in sterile water **OR** potassium chloride    Comparative Standards:    Energy Needs: MSJ: 1672.13- 1800.75 (  based on 1286.25: x 1.3 - 1.4)   Protein Needs: 73.78 g - 79.45 g (1.3 - 1.4 g/kg based on 56.75 kg)   Fluid needs: [place]-Segar: 2051.25 mL/d     Nutrition diagnosis:   Inadequate oral intake     related to physiological causes resulting in diminished appetite and intake as evidenced by sub-optimal PO intake for >1 month, severe weight loss, PO intake of <50% of meals while inpatient.. Active.                       Malnutrition Summary:  Moderate Chronic disease or condition related malnutrition related to AMS as evidenced by weight loss of >10% in 6 months and energy intake of <75% for >/= 1 month .  Present on admission.           Nutrition Intervention:  Oral Nutrition:    Inpatient Orders: Current diet order appropriate    Recommend Feeding  Assistance: Provided menu selection assistance    Nutrition recommendations: Energy Modification, Protein Modification  Energy Modification: Increased energy intake  Protein Modification: Increased protein intake    Modify schedule of food/fluids: Small, frequent meals     Oral Nutrition Supplements and/or Food Fortification  Recommendation for supplements to patient: Modify supplemental nourishment  Type of supplemental nourishment: Oral nutrition supplement  Supplement name and nutrient provision: Stop Boost order. Rec'd patient drink shake at breakfats daily     Vitamins/Minerals  Recommend vitamin supplement therapy    Specific vitamin recommendations: Vitamin D   --> Switch from ergocalciferol to 4,000 international units cholecalciferol daily            Nutrition Monitoring/Evaluation:  Nutrition Goal Indicator: Food intake  Nutrition Goal Progress: Some digression away from goal  Nutrition Goal 2 Indicator: Food intake  Nutrition Goal 2 Criteria: Intake of 1-3 oral nutrition supplements per day  Nutrition Goal 2 Progress: Some progress toward goal    Christel Mormon, RD, CNSC  [place] [phone]

## 2022-01-05 NOTE — Consults (Signed)
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 The patient has received VRd X 4 (achieving VGPR) then Mel 200 ASCT 05/12/11. 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 She met the criteria for PD on 02/19/2020 and was started on third line DARA PD in June 2021, which was recently held on 10/20/21 due to chronic diarrhea. Patient was transferred OSH after recent hypovolemic/distributive shock. Here for acute encephalopathy workup and acute liver failure workup    ASSESSMENT  Patient agreed to get out of bed at 1300 earlier today. However, at 1300 patient stating that she needs to eat lunch first before getting out of bed, and refuses to get out of bed. Educated patient on importance of OOB activity and that she hinders her own progress by not getting OOB. She tolerated bed in chair position for ~ 15 min with BP stable, and perform UE and LE therex. Patient with goal to get OOB tomorrow at 1100. She will benefit from skilled PT and placement for continued therapy to decrease caregiver burden with functional mobility.    Focus next session: upright tolerance, OOB, therex    RECOMMENDATIONS  DISCHARGE RECOMMENDATION  Comments Placement with ongoing PT needs      Patient Current Functional Status Sufficient for PT Discharge Recommendation Yes   Discharge DME needs TBD pending patient progress.   Discharge transportation needs [place]  Inpatient Rehab Assistive Device Recommendation Vertical dependent lift;Ceiling lift   Activity Recommendation Bed in chair position, OOB to chair with ceiling lift     SUBJECTIVE  Subjective report:  "You need to be patient."  Notable observations:  Supine in bed with HOB elevated. Bed in chair ~15 min. Supine in bed with HOB elevated at end of session.    SYSTEMS REVIEW  Cognition/Communication  Delirium screen:   Yes  Communication  Cognition/communication impaired:  Yes     Orientation: Disoriented to time  Communication: Difficulty making complex needs known  Cognition: Decreased attention, Decreased insight to deficits, Decreased safety awareness  Behavior: Self-limiting  Behavior comment: Behavioral. Extensive education on the importance of OOB activity.    Integumentary  Integumentary deficits:  YesIntegumentary deficit detail:  Jaundiced, edematous, wounds at buttocks, foley    Cardiopulmonary  Cardiopulmonary deficits:  Yes  Detail:  Decreased activity tolerance    Musculoskeletal  Musculoskeletal deficits:  Yes  Abnormal strength findings: Bilateral shoulder flexion 3-/5 bilaterally, elbow flexion at least 3/5 bilaterally. Hip flexion 2+/5 bilaterally, knee extension 2+/5 bilaterally, ankle DF 3-/5 bilaterally    Neuromuscular  Neuromuscular deficits:  Yes  Motor control or coordination: Impaired at bilateral lower extremities >> bilateral upper extremities        Pain     Currently in pain: Yes  Pain location: reports pressure at neck with bed in chair position.          COMPREHENSIVE MOVEMENT ANALYSIS/TREATMENT  Precautions/WB status: Yes  Precautions and weight bearing status comments: delrium, falls, skin      Hemodynamic response:   Normal hemodynamic response: Yes  Response:     Comments: BP stable in bed in chair position      Functional Mobility  Requires second person/additional health care providers: No    Bed mobility Current Initial    Rolling  Level of assist  Moderate assist;Minimal assist  Maximal assist (12/15/21 0932)   Device   Log roll (12/15/21 0932)   Intervention     Rolling comments:  Min-Mod A rolling L and R while performing pericare after being incontinent of BM. Patient refused to transfer to chair via ceiling lift stating she needs to eat lunch, despite 1300 being the agreed upon time at 1028. Patient stating, "I'm the boss of me." Bed placed in chair position to assess BP in an upright  position since patient has not been out of bed since last Thursday. BP stable with bed in chair, patient tolerated bed in chair for ~ 15 min, prior to lowering the bed height with max verbal cues on how to adjust HOB height.    Balance  Balance deficits noted:    Exercise/other interventions  Supine exercises comment (other exercise, details): Bed in chair position- bicep curls x 15, chest press x 15, knee extension with active assist x 10, ankle pumps x 15, hip adduction x 15  Other exercise: Extensive education on importance of upright activity and OOB activity to improve patient's functional mobility    Communication between other health care providers: Communication between other health care provider: OT;RN  Communication comment:       Education Careers adviser: Patient  Content: Plan of care, Activity recommendations  Response: Needs reinforcement  Outcome measures   Physical Therapist Global Assessment of Mobility  Activity Achieved: Bed Exercises or Sitting edge of bed <5 minutes, cycle or Active ROM  AMPAC 6-clicks basic mobility score: 9      PLAN  Plan of care status:  Plan of care adjusted to reflect progress  PT frequency:  4x/week  PT duration:  4 weeks.  Comment:  POC expires 2/26        Planned PT interventions:   Specific interventions: Progressive functional mobility training;Gait training;Balance training;NM Re-ed;Aerobic training;Ther ex  Education interventions: Caregiver training;Benefits of activity;Self-pacing/breathing;Exercise program;Mobility with precautions compliance;Fall risk reduction  Comment:          Leotis Shames, PT, DPT, NCS    01/05/2022

## 2022-01-05 NOTE — Progress Notes (Addendum)
[facility] PROGRESS NOTE  ATTENDING ONLY     My date of service is 01/05/22.    24 Hour Course   NAEON. Rectal tube removed.     Subjective  Sore at anus due to rectal tube removal, but otherwise no complaints. Per RN, rectal tube was removed due to formed stool being present.    Vitals  Temp:  [36.3 C (97.3 F)-37.1 C (98.8 F)] 36.3 C (97.3 F)  Heart Rate:  [67-85] 75  *Resp:  [16-20] 16  BP: (100-116)/(64-78) 105/70  SpO2:  [96 %-99 %] 99 %    MostRecent Weight: 90.9 kg (200 lb 6.4 oz)  Admission Weight: 79.6 kg (175 lb 7.8 oz)      Intake/Output Summary (Last 24 hours) at 01/05/2022 1137  Last data filed at 01/05/2022 1000  Gross per 24 hour   Intake 2101 ml   Output 1325 ml   Net 776 ml     Pain Score: 0    Physical Exam  Vitals reviewed.   Constitutional:       Appearance: She is ill-appearing.   HENT:      Head: Normocephalic and atraumatic.      Nose: Nose normal.      Mouth/Throat:      Mouth: Mucous membranes are moist.      Pharynx: Oropharynx is clear.   Eyes:      General: Scleral icterus present.      Extraocular Movements: Extraocular movements intact.      Pupils: Pupils are equal, round, and reactive to light.   Cardiovascular:      Rate and Rhythm: Normal rate and regular rhythm.      Pulses: Normal pulses.      Heart sounds: Normal heart sounds.   Pulmonary:      Effort: Pulmonary effort is normal.      Breath sounds: Normal breath sounds.   Abdominal:      General: Abdomen is flat. Bowel sounds are normal.      Palpations: Abdomen is soft.   Musculoskeletal:         General: No swelling. Normal range of motion.      Cervical back: Normal range of motion and neck supple.      Right lower leg: No edema.      Left lower leg: No edema.   Skin:     General: Skin is warm and dry.      Capillary Refill: Capillary refill takes less than 2 seconds.      Coloration: Skin is jaundiced (improving).   Neurological:      General: No focal deficit present.      Mental Status: She is  alert and oriented to person, place, and time.   Psychiatric:         Mood and Affect: Mood normal.         Behavior: Behavior is slowed.     Scheduled Meds:   0.9% sodium chloride flush  3 mL Intravenous Q12H [place]    clotrimazole   Topical BID SCH    dicyclomine  20 mg Oral 4x Daily [place]    dry mouth oral rinse  5 mL Mucous Membrane 4x Daily [place]    ergocalciferol (vitamin D2)  50,000 Units Oral Q7 Days [place]    ganciclovir (CYTOVENE) IVPB  5 mg/kg (Dosing Weight) Intravenous Q12H [place]    melatonin  6 mg Oral Daily At Bedtime [facility]    multivitamin complete chewable  1  tablet Oral Daily [place]    nitazoxanide  500 mg Oral Q12H [place]    QUEtiapine  50 mg Oral Once    rifAXIMin  550 mg Oral TID [facility]    thiamine  200 mg Intravenous Q24H    ursodiol  600 mg Oral BID [place]    vitamin E (dl, acetate)  800 Units Oral Daily [place]     Continuous Infusions:   dextrose 5 % and 0.9 % sodium chloride 75 mL/hr (01/05/22 0622)     PRN Meds:   0.9% sodium chloride flush  3 mL Intravenous PRN    albuterol  2.5 mg Nebulization Q4H PRN    Or    albuterol  2.5 mg Nebulization Q4H PRN    HYDROmorphone  0.3-0.8 mg Intravenous Q4H PRN    hydrOXYzine  10 mg Oral Q8H PRN    loperamide  4 mg Oral 4x Daily PRN    magnesium sulfate in dextrose 5 %  2-4 g Intravenous Daily PRN    Or    magnesium sulfate in water  2-4 g Intravenous Daily PRN    ondansetron  8 mg Intravenous Q8H PRN    polyethylene glycol  17 g Oral Daily PRN    potassium chloride in sterile water  20-80 mEq Intravenous Daily PRN    Or    potassium chloride in sterile water  20-80 mEq Intravenous Daily PRN    Or    potassium chloride  20-80 mEq Oral Daily PRN     Data    CBC        01/05/22  0520 01/04/22  0510   WBC 3.8 4.0   HGB 7.5* 7.4*   HCT 22.7* 21.9*   PLT 71* 66*     Coags        01/05/22  0520 01/04/22  0510   INR 1.4* 1.3*     Chem7        01/05/22  0520 01/04/22  1722 01/04/22  0510   NA 139 138 138   K 3.2* 3.6 2.7*   CL 112* 112* 111*   CO2 22 22 23    BUN  2* 2* 2*   CREAT 0.31* 0.31* 0.31*   GLU 92 94 93     Electrolytes        01/05/22  0520 01/04/22  1722 01/04/22  0510   CA 7.1* 7.3* 7.1*   MG 2.0  --  1.9     Liver Panel        01/05/22  0520 01/04/22  0510   AST 97* 106*   ALT 75* 78*   ALKP 457* 479*   TBILI 11.7* 11.6*   TP 3.3* 3.4*   ALB 1.2* 1.2*     Lactate  No results found in last 36 hours    Microbiology Results (last 24 hours)     ** No results found for the last 24 hours. **        Radiology Results  No results found.     I discussed the patient with Carmin Muskrat, MD from [facility] regarding A&P.    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 The patient has received VRd X 4 (achieving VGPR) then Mel 200 ASCT 05/12/11. 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 She met the criteria for PD on 02/19/2020 and was started on third line DARA  PD in June 2021, which was recently held on 10/20/21 due to chronic diarrhea. Patient was transferred OSH after recent hypovolemic/distributive shock. Here for acute encephalopathy workup and acute liver failure workup.      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   June 2020 due to progression of M spike Revlimid was increased to 15 mg PO D1-21 every 28 days, plus dexamethasone  July 2021: started daratumumab, Pomylast 2 mg D 1-21, Q28 days, dexamethasone 12 mg PO Q weekly.   Primary oncologist: Dr. Iona Coach,  Dr. Kennon Rounds [place] [facility]  Receiving dexamethasone 12 mg weekly for diarrhea  Receiving MT with daratumumab with pomalyst and dex, most recent dose 10/16/21, has since been on hold due to worsening diarrhea.     DATA:   09/07/2010: IgG Kappa MM  12/02/2021: Ig M <5, IgG 447, IgA 20, Ig E <2, SFLC Kappa 16.6,  Lambda 1.6, Kappa/Lamda 10.38  12/14/21: IgG K on IFE, IgG 430, KLC 16.6, LLC 2.7, KLR 6.15    Chemo:   Holding Daratumumab and Pomalyst, last dose 10/20/21, given recent infection and acute liver failure.    Supportive Care:   Transfusion: Keep Hg >7, and Platelets >10.   Per daughter, has autoantibodies in blood was a difficult match. Would like to be contacted prior to transfusion.   Line: PICC placed 1/26  Outpatient Oncologist: Dr. Kennon Rounds    Dispo: Given improvement in diarrhea and slowly improving Tbili (will take weeks to months per hepatology), plan to transfer back to prior OSH vs placement in SNF    # Immunocompromised  # CMV colitis  # At risk for infections due to malignancy  # Shock, likely hypovolemic vs distributive in the setting of chronic diarrhea, + norovirus infection   # Acute on chronic diarrhea, norovirus positive., diff and OP negative.   -No prior evidence of adrenal insufficiency, AM cortisol levels normal, previously on dexamethasone 8 mg PO Qweekly, but has been held since early December.    Data:  - CT brain: No acute intracranial hemorrhage, herniation, or hydrocephalus.  - CT chest/A/P pending: Medium-sized bilateral pleural effusions with bilateral peribronchovascular groundglass opacities, suggestive of pulmonary edema. Clustered nodules in right middle lobe along the right minor fissure, which could represent mild aspiration or infection. Numerous sclerotic and mixed lytic/sclerotic lesions throughout the thoracic spine and bilateral ribs, increased in size and number compared to prior. Appearance is atypical for multiple myeloma but may represent a combination of treated, partially treated, and active disease. No acute pathologic fracture. Findings compatible with enterocolitis of infectious or inflammatory etiology. Involvement of the colon is greater than of the small bowel. Marked hepatic steatosis. Increased sclerosis of bone lesions throughout the thoracolumbar spine,  bony pelvis, and proximal left femur. Appearance is atypical for multiple myeloma but may represent a combination of treated, partially treated, and active disease. No acute pathologic fracture.  - 1/29:CMV PCR 542,706CBJS  - 1/31: GI PCR panel positive only for Norovirus.   2/1: LP results: Neutrophils 6% in tube 1, Lymphocytes 88% (predominant), protein 42 (normal), glucose 36 (low), rapid HSV in CSF negative. CMV CSF PCR negative    - 2/2 CMV PCR 179,555High   - 2/2 Flex Sig Biopsies prelim: "colonic mucosa with a rare small non-necrotizing mucosal granuloma  and rare CMV immunolabeling"   - 2/5 [place] work up: sIL2 (1556.6 high), ferritin (948 high), TG(258 high), NKA-IFN? 10 (WNL)  - 2/8: CMV PCR: 3107 (sig improvement)  - 2/10: C diff: negative.  - 2/12: Repeat GI viral PCR: positive for Norovirus  - 2/15 CMV 755  - 2/17 IgG 745    Plan:  - appreciate ID recs  -- Valcyte 900 mg BID (2/21 ->)  -- Started on Alinia (nitazoxanide) for 3 days (2/16 - 2/18), prolonged to 1 week course given persistent diarrhea (to end 2/22)  -- Ganciclovir 5 mg/kg (1/31-2/21). S/p one dose of IVIG on 2/7.   -- retest Norovirus PCR prior to end of Alinia course (ordered for 2/21)  -- s/p IV Zosyn 4.5 g Q8hrs (1/26-1/28, restarted 1/29 - 2/1 for recurrent fever)  - GI consult: appreciate recs: flex sig 2/2  - Weekly CMV  - Rifaximin dose adjusted to cover for SIBO (2/14 - 2/16 400 mg, 550 mg TID 2/16->)  - OI ppx: Acyclovir IV    # Grade III neutropenia, at baseline  # Leukopenia  # Thrombocytopenia (possibly 2/2 liver disease)  Data: WBC of 2.2 on 12/05/21, on Zarxio at home  Plan: Continue to monitor    # Acute liver injury of unclear etiology  # Acute on chronic encephalopathy likely 2/2 infectious vs hepatic vs metabolic  # Hyperbilirubinemia  # Anasarca  # Hypoalbuminemia    Data:   -Steady rise in LFTs in 05/2021,  -12/02/21: AST 152--> 121, ALT 130--> 110, TB 5.4, Albumin 1.1  -Hepatitis panel negative.   -1/26:  Tbili 14.6,  GGT 1160, ammonia 25  -CTAP 12/02/21: Scattered sclerotic lesions in the lumbar spine, hepatic steatosis.   -RUQ ultrasound: liver enlarged with diffuse increased echogenicity, consistent with fatty infiltration. 2cm stone in the gallbladder.   -HIDA scan: Normal radiotracer filling of the gallbladder is identified, therefore the exam is not consistent with cystic duct obstruction or acute cholecystitis.  - [place] abd 1/27: Hepatomegaly with diffusely increased echogenicity which can be seen with hepatic amyloidosis or steatosis. Please refer to the recently performed biopsy for further details. Mild perihepatic ascites. Patent hepatic vasculature with normal waveforms. Cholelithiasis without acute cholecystitis. Right pleural effusion.  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 CMV stains: Negative.  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 Could be drug related. Increase Ursodiol to 600 mg BID, Check Hepatitis E. 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 Unlikely diarrhea from Ursodiol.  - Avoiding steroids  - Daily LFT/INR  - RD consult placed to expand diet to more than protein shakes.  -- Family requesting appetite stimulant but risk > benefit.     # Acute toxic encephalopathy, improving  In  the setting of liver failure, ammonia only 25. LP performed 2/1 to r/o  encephalitis.     Data:   1/31: HIV, TSH, Folate, B12 WNL  1/30: CT head WNL other than sinus disease  2/1: LP results: Neutrophils 6% in tube 1, Lymphocytes 88% (predominant), protein 42 (normal), glucose 36 (low), rapid HSV in CSF negative. CMV CSF PCR negative  2/5: Brain MRI: Smooth diffuse dural thickening and enhancement as well as bilateral holohemispheric subdural effusions, which may be on the basis of diminished intracranial pressure following recent lumbar puncture.  -2/8: EEG: non-specific mild cerebral dysfunction.  -2/9: CT Brain: similar to prior MRI with hyperdense areas likely hygromas    Plan:  - Neurology consult: 2/7: Encephalopathy likely from underlying medical disease. Delirium prevention. Avoid Ativan and try Seroquel for delirium. MRI findings is from LP. Okay to try caffiene tablets in the daytime to help with postural headache but patient is allergic to it.  -2/8: Repeat CT head to monitor the status of subdural effusion: stable, EEG: diffuse slowing, Thiamine iv prophylaxis.  -Speech path: Regular and thin liquid. Slightly higher risk for aspiration given encephalopathy.  - Start empiric rifaximin given possibility of HE without elevated ammonia (2/14 ->)  -- dose adjusted as above to cover for SIBO  -- holding off on lactulose given diarrhea    # Deconditioning  Pt has not been engaging much with PT/OT despite improved mental status. May be due in part to anxieties about diarrhea/incontinence.  - continue PT/OT  - continue encouragement to participate, particularly by family/familiar faces    # Hypokalemia:  # Hypomagnesemia:  Replete as needed.    # Vitamin D deficiency:  -Supplements ordered.    # Moderate malnutrition:  -Seen by RD and given recs for supplements.    # History of PE, diagnosed in April 2019  - hold home [place] for now given recent anemia and concerns for bleeding.   - monitor for signs and  symptoms of worsening bleeding.     # Elevated TSH at OSH, likely 2/2 subclinical hypothyroidism  -TSH WNL on 12/15/20.      # Anxiety, emotional distress:   -Patient has stressful social situation, intellectually disabled son, and regarding underlying disease process.     # Chronic buttocks pressure wound  - Wound care consult, appreciate recs  - Position changes as tolerated, waffle cushion, etc  -- Pressure wound ruled out, treating bilateral buttocks moisture and friction damage   - Rectal tube placed 2/18 to avoid contamination of wounds by stool    Wound care:  - Bilateral Groin/Thighs   Gently cleanse with warm water & Remedy no-rinse foam (pH-balanced cleanser) with soft cloths, pat dry ensuring   thoroughly dried.   Do not use CHG to rash areas.   Apply Clotrimazole ointment to rash BID, ensure thoroughly rubbed in.   Apply a piece of Interdry textile [id number]) leaving 2 inches exposed from fold to facilitate wicking.     - Bilateral Buttocks   First rinse with perineal irrigation bottle & warm water to remove as much stool as possible to minimize wiping.   Cleanse wounds & surrounding skin with Anasept wound cleanser spray (EQA#[id number]), pat dry with sterile gauze.   Apply a thick layer of Triad hydrophilic wound dressing paste (QIW#[id number]) over wounds & periwound areas.   Promptly clean patient after episodes of incontinence using method described.   Remove only the top, soiled layer of Triad during incontinence cleaning to avoid further eroding the skin & apply another top   layer of Triad following cleaning.  Apply Triad at least BID & PRN to keep area protected.     - Pressure Injury Prevention   Continue q2h repositioning- 30 degree lateral turn to L & R only unless medically contraindicated to avoid direct pressure on   sacrum.   Place Waffle mattress overlay [phone]) under patient in bed to assist with pressure redistribution.   Use waffle cushion while OOB to chair, reposition frequently  while in chair every 30 minutes.   Additionally, float heels completely off of bed. If pillows do not suffice, order heel offloading boots (PMM# [id number]).     # VTE PPx:  Contraindicated: Other: anticipate Plt <50    Severity of Illness  Stable     During this hospitalization the patient is also being treated for:  - Sepsis (present on admission)    - Malignancy associated fatigue on admission  - Neoplasm/malignancy associated pain  - Hypoalbuminemia (present on admission)  - Fluid status: Hypovolemia on admission     - Cachexia  - Encephalopathy: Metabolic encephalopathy     Code Status: FULL    [name], MD  01/05/22

## 2022-01-06 LAB — BASIC METABOLIC PANEL (NA, K, CL, CO2, BUN, CR, GLU, CA)
Anion Gap: 5 (ref 4–14)
Anion Gap: 5 (ref 4–14)
Calcium, total, Serum / Plasma: 7.1 mg/dL — ABNORMAL LOW (ref 8.4–10.5)
Calcium, total, Serum / Plasma: 7 mg/dL — ABNORMAL LOW (ref 8.4–10.5)
Carbon Dioxide, Total: 23 mmol/L (ref 22–29)
Carbon Dioxide, Total: 21 mmol/L — ABNORMAL LOW (ref 22–29)
Chloride, Serum / Plasma: 112 mmol/L — ABNORMAL HIGH (ref 101–110)
Chloride, Serum / Plasma: 115 mmol/L — ABNORMAL HIGH (ref 101–110)
Creatinine: 0.3 mg/dL — ABNORMAL LOW (ref 0.55–1.02)
Creatinine: 0.29 mg/dL — ABNORMAL LOW (ref 0.55–1.02)
Glucose, non-fasting: 87 mg/dL (ref 70–199)
Glucose, non-fasting: 81 mg/dL (ref 70–199)
Potassium, Serum / Plasma: 3.7 mmol/L (ref 3.5–5.0)
Potassium, Serum / Plasma: 3.1 mmol/L — ABNORMAL LOW (ref 3.5–5.0)
Sodium, Serum / Plasma: 140 mmol/L (ref 135–145)
Sodium, Serum / Plasma: 141 mmol/L (ref 135–145)
Urea Nitrogen, Serum / Plasma: 3 mg/dL — ABNORMAL LOW (ref 7–25)
Urea Nitrogen, Serum / Plasma: 3 mg/dL — ABNORMAL LOW (ref 7–25)
eGFRcr: 121 mL/min/{1.73_m2} (ref >59)
eGFRcr: 122 mL/min/{1.73_m2} (ref >59)

## 2022-01-06 LAB — PREPARE RBC: RBCs - Units Ready: 1

## 2022-01-06 LAB — COMPREHENSIVE METABOLIC PANEL (BMP, AST, ALT, T.BILI, ALKP, TP ALB)
AST: 85 U/L — ABNORMAL HIGH (ref 5–44)
Alanine transaminase: 66 U/L — ABNORMAL HIGH (ref 10–61)
Albumin, Serum / Plasma: 1.1 g/dL — ABNORMAL LOW (ref 3.4–4.8)
Alkaline Phosphatase: 413 U/L — ABNORMAL HIGH (ref 38–108)
Anion Gap: 2 — ABNORMAL LOW (ref 4–14)
Bilirubin, Total: 10.1 mg/dL — ABNORMAL HIGH (ref 0.2–1.2)
Calcium, total, Serum / Plasma: 7 mg/dL — ABNORMAL LOW (ref 8.4–10.5)
Carbon Dioxide, Total: 23 mmol/L (ref 22–29)
Chloride, Serum / Plasma: 115 mmol/L — ABNORMAL HIGH (ref 101–110)
Creatinine: 0.32 mg/dL — ABNORMAL LOW (ref 0.55–1.02)
Glucose, non-fasting: 258 mg/dL — ABNORMAL HIGH (ref 70–199)
Potassium, Serum / Plasma: 3.2 mmol/L — ABNORMAL LOW (ref 3.5–5.0)
Protein, Total, Serum / Plasma: 3.1 g/dL — ABNORMAL LOW (ref 6.3–8.6)
Sodium, Serum / Plasma: 140 mmol/L (ref 135–145)
Urea Nitrogen, Serum / Plasma: 3 mg/dL — ABNORMAL LOW (ref 7–25)
eGFRcr: 119 mL/min/{1.73_m2} (ref >59)

## 2022-01-06 LAB — CYTOMEGALOVIRUS DNA, QUANTITATIVE PCR, PLASMA
CMV DNA (IU/mL): 74 IU/mL — ABNORMAL HIGH (ref <30)
CMV DNA (Log IU/mL): 1.87 — ABNORMAL HIGH (ref <1.48)

## 2022-01-06 LAB — COMPLETE BLOOD COUNT WITH DIFFERENTIAL
Abs Basophils: 0 10*9/L (ref 0.00–0.10)
Abs Eosinophils: 0.01 10*9/L (ref 0.00–0.40)
Abs Imm Granulocytes: 0.04 10*9/L (ref <0.10)
Abs Lymphocytes: 0.19 10*9/L — ABNORMAL LOW (ref 1.00–3.40)
Abs Monocytes: 0.1 10*9/L — ABNORMAL LOW (ref 0.20–0.80)
Abs Neutrophils: 2.5 10*9/L (ref 1.80–6.80)
Hematocrit: 21.1 % — ABNORMAL LOW (ref 36.0–46.0)
Hemoglobin: 6.8 g/dL — CL (ref 12.0–15.5)
MCH: 37.2 pg — ABNORMAL HIGH (ref 26.0–34.0)
MCHC: 32.2 g/dL (ref 31.0–36.0)
MCV: 115 fL — ABNORMAL HIGH (ref 80–100)
MPV: 13.2 fL — ABNORMAL HIGH (ref 9.1–12.6)
Platelet Count: 62 10*9/L — ABNORMAL LOW (ref 140–450)
RBC Count: 1.83 10*12/L — ABNORMAL LOW (ref 4.00–5.20)
RDW-CV: UNDETERMINED % (ref 11.7–14.4)
WBC Count: 2.8 10*9/L — ABNORMAL LOW (ref 3.4–10.0)

## 2022-01-06 LAB — BILIRUBIN, DIRECT: Bilirubin, Direct: 7.1 mg/dL — ABNORMAL HIGH (ref <0.6)

## 2022-01-06 LAB — PROTHROMBIN TIME
INR: 1.5 — ABNORMAL HIGH (ref 0.9–1.2)
PT: 17.2 s — ABNORMAL HIGH (ref 11.6–15.0)

## 2022-01-06 LAB — FIBRINOGEN, FUNCTIONAL: Fibrinogen, Functional: 154 mg/dL — ABNORMAL LOW (ref 202–430)

## 2022-01-06 LAB — MAGNESIUM, SERUM / PLASMA: Magnesium, Serum / Plasma: 1.7 mg/dL (ref 1.6–2.6)

## 2022-01-06 MED ORDER — CHOLECALCIFEROL (VITAMIN D3) 25 MCG (1,000 UNIT) TABLET
1000 | Freq: Every day | ORAL | Status: DC
Start: 2022-01-06 — End: 2022-01-15
  Administered 2022-01-06 – 2022-01-14 (×9): 4000 [IU] via ORAL
  Administered 2022-01-15: 18:00:00 via ORAL

## 2022-01-06 MED ORDER — RIFAXIMIN 550 MG TABLET
550 | Freq: Two times a day (BID) | ORAL | Status: DC
Start: 2022-01-06 — End: 2022-01-15
  Administered 2022-01-07 – 2022-01-14 (×16): 550 mg via ORAL
  Administered 2022-01-15: 06:00:00 via ORAL
  Administered 2022-01-15: 18:00:00 550 mg via ORAL

## 2022-01-06 MED FILL — URSODIOL 60 MG/ML ORAL SYRUP: 60 mg/mL | ORAL | Qty: 10

## 2022-01-06 MED FILL — MAGNESIUM SULFATE 1 GRAM/100 ML IN DEXTROSE 5 % INTRAVENOUS PIGGYBACK: 1 g/00 mL | INTRAVENOUS | Qty: 100

## 2022-01-06 MED FILL — THIAMINE HCL (VITAMIN B1) 100 MG/ML INJECTION SOLUTION: 100 mg/mL | INTRAMUSCULAR | Qty: 2

## 2022-01-06 MED FILL — NITAZOXANIDE 500 MG TABLET: 500 mg | ORAL | Qty: 1

## 2022-01-06 MED FILL — MELATONIN 1 MG TABLET: 1 mg | ORAL | Qty: 1

## 2022-01-06 MED FILL — XIFAXAN 550 MG TABLET: 550 mg | ORAL | Qty: 1

## 2022-01-06 MED FILL — URSODIOL (BULK) 100 % POWDER: 100 % | Qty: 0.6

## 2022-01-06 MED FILL — ANTI-DIARRHEAL (LOPERAMIDE) 2 MG TABLET: 2 mg | ORAL | Qty: 2

## 2022-01-06 MED FILL — POTASSIUM CHLORIDE 40 MEQ/100ML IN STERILE WATER INTRAVENOUS PIGGYBACK: 40 mEq/100 mL | INTRAVENOUS | Qty: 100

## 2022-01-06 MED FILL — DICYCLOMINE 10 MG CAPSULE: 10 mg | ORAL | Qty: 2

## 2022-01-06 MED FILL — FLINTSTONES COMPLETE (IRON) CHEWABLE TABLET: ORAL | Qty: 1

## 2022-01-06 MED FILL — VITAMIN E (DL, ACETATE) 45 MG (100 UNIT) CAPSULE: 45 mg (100 unit) | ORAL | Qty: 8

## 2022-01-06 MED FILL — VALGANCICLOVIR 450 MG TABLET: 450 mg | ORAL | Qty: 2

## 2022-01-06 MED FILL — POTASSIUM CHLORIDE ER 10 MEQ TABLET,EXTENDED RELEASE: 10 mEq | ORAL | Qty: 2

## 2022-01-06 MED FILL — MAGNESIUM SULFATE 2 GRAM/50 ML (4 %) IN WATER INTRAVENOUS PIGGYBACK: 2 gram/50 mL (4 %) | INTRAVENOUS | Qty: 50

## 2022-01-06 MED FILL — DILAUDID (PF) 0.5 MG/0.5 ML INJECTION SYRINGE: 0.5 mg/ mL | INTRAMUSCULAR | Qty: 0.5

## 2022-01-06 MED FILL — CLOTRIMAZOLE 1 % TOPICAL CREAM: 1 % | TOPICAL | Qty: 15

## 2022-01-06 MED FILL — CHOLECALCIFEROL (VITAMIN D3) 25 MCG (1,000 UNIT) TABLET: 1000 UNITS | ORAL | Qty: 4

## 2022-01-06 NOTE — Consults (Signed)
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 The patient has received VRd X 4 (achieving VGPR) then Mel 200 ASCT 05/12/11. 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 She met the criteria for PD on 02/19/2020 and was started on third line DARA PD in June 2021, which was recently held on 10/20/21 due to chronic diarrhea. Patient was transferred OSH after recent hypovolemic/distributive shock. Here for acute encephalopathy workup and acute liver failure workup    Medical Update:        ASSESSMENT  Tried to see pt at 11 am - was receiving blood transfusion.  In pm, pt reported being too tired to get up and agreed to do bed exs after much coaxing.  Pt's LE strength is grossly 3-/5.  Emphasized and ed again on the need to get up.  Pt agreed to try to get up tomorrow.  Appt made for 1100.  Try standing in STEDY and up to chair tomorrow.    Focus next session: upright tolerance, OOB, therex. Try standing in STEDY .    RECOMMENDATIONS  DISCHARGE RECOMMENDATION  Comments Placement with ongoing PT needs  Pending medical stability.   Patient Current Functional Status Sufficient for PT Discharge Recommendation Yes   Discharge DME needs TBD pending patient progress.   Discharge transportation needs [facility] Potential Patient participates well in therapy and progressing towards goals     NURSING RECOMMENDATIONS  Inpatient Rehab Assistive Device Recommendation Vertical dependent lift;Ceiling lift   Activity Recommendation Bed in chair position, OOB to chair with ceiling lift     SUBJECTIVE  Subjective report:  Saw pt on 3rd attempt. Pt was receiving blood tr this am.  Pt now stating she is too tired to get up.  Having diarhhea.  Agreed to do bed exs w/ some encouragement.  Notable observations:  Received and left in bed.  All needs at reach and Rn informed.  Pt's kids came at end of session.    SYSTEMS  REVIEW  Cognition/Communication  Delirium screen:  Yes Delirium Screen:   Details:  Encephalopathic    Communication  Cognition/communication impaired:  Yes     Communication: Difficulty making complex needs known  Cognition: Decreased insight to deficits  Behavior: Self-limiting    Integumentary  Integumentary deficits:  YesIntegumentary deficit detail:  Jaundiced, edematous, wounds at buttocks (not observed), foley    Cardiopulmonary  Cardiopulmonary deficits:  Yes  Detail:  Decreased activity tolerance    Musculoskeletal  Musculoskeletal deficits:  Yes  Abnormal strength findings: Bilateral shoulder flexion 3-/5 bilaterally, elbow flexion at least 3/5 bilaterally. Hip flexion 2+/5 bilaterally, knee extension 2+/5 bilaterally, ankle DF 3-/5 bilaterally    Neuromuscular  Neuromuscular deficits:  Yes  Motor control or coordination: Impaired at bilateral lower extremities >> bilateral upper extremities        Pain                COMPREHENSIVE MOVEMENT ANALYSIS/TREATMENT  Precautions/WB status: Yes  Precautions and weight bearing status comments: delrium, falls, skin      Functional Mobility  Requires second person/additional health care providers: No    Bed mobility Current Initial    Rolling  Level of assist  Moderate assist  Maximal assist (12/15/21 0932)   Device   Log roll (12/15/21 0932)   Intervention     Rolling comments:  Balance  Balance deficits noted:    Exercise/other interventions  Supine exercises: Ankle pumps;Heel slides;Hip abd/add;Quad sets  Supine exercises comment (other exercise, details): and glut sets.  Performed all exs 10x each.  Other exercise: Extensive education on importance of upright activity and OOB activity to improve patient's functional mobility.  Also ed on elevation on B LEs    Communication between other health care providers: Communication between other health care provider: OT;RN  Communication comment: patient's functional status     Education assessment  Learner:  Patient  Content: Plan of care, Activity recommendations  Response: Needs reinforcement  Outcome measures   Physical Therapist Global Assessment of Mobility  Activity Achieved: Bed Exercises or Sitting edge of bed <5 minutes, cycle or Active ROM  AMPAC 6-clicks basic mobility score: 9      PLAN  Plan of care status:  Current plan of care remains appropriate  PT frequency:  4x/week  PT duration:  4 weeks.  Comment:  POC expires 2/26      Planned PT interventions:   Specific interventions: Progressive functional mobility training;Gait training;Balance training;NM Re-ed;Aerobic training;Ther ex  Education interventions: Caregiver training;Benefits of activity;Self-pacing/breathing;Exercise program;Mobility with precautions compliance;Fall risk reduction  Comment:          Craig Staggers, MPT    01/06/2022

## 2022-01-06 NOTE — Interdisciplinary (Signed)
Spiritual Care Services    Religion-Specific Care Provided    Meaning-making/Spirituality/Religion: [facility] Provided  Patient received the sacrament(s) of Blessings provided by Rev. Fr. Narcis Kabipi on 01/06/22.      Spiritual care is available 24/7. To reach the chaplain on call at any time:  [place] or call [place] or call [place] or call [phone]

## 2022-01-06 NOTE — Progress Notes (Signed)
[facility] PROGRESS NOTE  ATTENDING ONLY     My date of service is 01/06/22.    24 Hour Course  Had watery diarrhea this morning. Sent GI viral pathogen PCR     Subjective  Pt has no complaint. Denies any abdominal discomfort     Vitals  Temp:  [36.3 C (97.3 F)-37 C (98.6 F)] 37 C (98.6 F)  Heart Rate:  [68-78] 68  *Resp:  [14-20] 19  BP: (104-119)/(66-78) 116/75  SpO2:  [96 %-99 %] 96 %    MostRecent Weight: 90.3 kg (199 lb 1.2 oz)  Admission Weight: 79.6 kg (175 lb 7.8 oz)      Intake/Output Summary (Last 24 hours) at 01/06/2022 1445  Last data filed at 01/06/2022 1413  Gross per 24 hour   Intake 2732 ml   Output 1455 ml   Net 1277 ml     Pain Score: 0    Physical Exam  Vitals reviewed.   Constitutional:       Appearance: She is ill-appearing.   HENT:      Head: Normocephalic and atraumatic.      Nose: Nose normal.      Mouth/Throat:      Mouth: Mucous membranes are moist.      Pharynx: Oropharynx is clear.   Eyes:      General: Scleral icterus present.      Extraocular Movements: Extraocular movements intact.      Pupils: Pupils are equal, round, and reactive to light.   Cardiovascular:      Rate and Rhythm: Normal rate and regular rhythm.   Pulmonary:      Effort: Pulmonary effort is normal. No respiratory distress.   Abdominal:      General: Abdomen is flat. There is no distension.      Palpations: Abdomen is soft.      Tenderness: There is no abdominal tenderness.   Musculoskeletal:      Cervical back: Normal range of motion and neck supple.      Right lower leg: No edema.      Left lower leg: No edema.   Skin:     General: Skin is warm and dry.      Coloration: Skin is jaundiced.   Neurological:      Mental Status: She is alert and oriented to person, place, and time.   Psychiatric:         Mood and Affect: Mood normal.         Behavior: Behavior is slowed.     Scheduled Meds:   0.9% sodium chloride flush  3 mL Intravenous Q12H [place]    cholecalciferol (vitamin D3)  4,000 Units Oral  Daily [place]    clotrimazole   Topical BID SCH    dicyclomine  20 mg Oral 4x Daily [place]    dry mouth oral rinse  5 mL Mucous Membrane 4x Daily [place]    melatonin  6 mg Oral Daily At Bedtime [facility]    multivitamin complete chewable  1 tablet Oral Daily [place]    nitazoxanide  500 mg Oral Q12H [place]    QUEtiapine  50 mg Oral Once    rifAXIMin  550 mg Oral BID [place] [facility]    thiamine  200 mg Intravenous Q24H    ursodiol  600 mg Oral BID [facility]    valGANciclovir  900 mg Oral BID [place]    vitamin E (dl, acetate)  800 Units Oral Daily [facility]  Continuous Infusions:   dextrose 5 % and 0.9 % sodium chloride 75 mL/hr (01/06/22 1153)     PRN Meds:   0.9% sodium chloride flush  3 mL Intravenous PRN    albuterol  2.5 mg Nebulization Q4H PRN    Or    albuterol  2.5 mg Nebulization Q4H PRN    HYDROmorphone  0.3-0.8 mg Intravenous Q4H PRN    hydrOXYzine  10 mg Oral Q8H PRN    loperamide  4 mg Oral 4x Daily PRN    magnesium sulfate in dextrose 5 %  2-4 g Intravenous Daily PRN    Or    magnesium sulfate in water  2-4 g Intravenous Daily PRN    ondansetron  8 mg Intravenous Q8H PRN    polyethylene glycol  17 g Oral Daily PRN    potassium chloride in sterile water  20-80 mEq Intravenous Daily PRN    Or    potassium chloride in sterile water  20-80 mEq Intravenous Daily PRN    Or    potassium chloride  20-80 mEq Oral Daily PRN     Data    CBC        01/06/22  0300 01/05/22  0520   WBC 2.8* 3.8   HGB 6.8* 7.5*   HCT 21.1* 22.7*   PLT 62* 71*     Coags        01/06/22  0300 01/05/22  0520   INR 1.5* 1.4*     Chem7        01/06/22  0450 01/06/22  0300 01/05/22  1755 01/05/22  0520   NA 141 140 140 139   K 3.1* 3.2* 3.7 3.2*   CL 115* 115* 112* 112*   CO2 21* 23 23 22    BUN 3* 3* 3* 2*   CREAT 0.29* 0.32* 0.30* 0.31*   GLU 81 258* 87 92     Electrolytes        01/06/22  0450 01/06/22  0300 01/05/22  1755 01/05/22  0520   CA 7.0* 7.0* 7.1* 7.1*   MG  --  1.7  --  2.0     Liver Panel        01/06/22  0300 01/05/22  0520   AST 85* 97*   ALT 66*  75*   ALKP 413* 457*   TBILI 10.1* 11.7*   TP 3.1* 3.3*   ALB 1.1* 1.2*     Lactate  No results found in last 36 hours    Microbiology Results (last 24 hours)     Procedure Component Value Units Date/Time    Cytomegalovirus DNA, Quantitative PCR, Plasma [[id number]]  (Abnormal) Collected: 01/06/22 0300    Order Status: Completed Specimen: Not Applicable from Serum Updated: 01/06/22 1443     CMV DNA (IU/mL) 74 IU/mL      Comment: *SUBCRITICAL*  Performed by Real-Time PCR  Note new reference range.          CMV DNA (Log IU/mL) 1.87     Comment: Log10 IU/mL  Note new reference range.         GI Viral Panel PCR [[id number]] Collected: 01/06/22 0935    Order Status: Sent Specimen: Not Applicable from Stool Updated: 01/06/22 1218    COVID-19 RNA, RT-PCR/Nucleic Acid Amplification RETEST (Asymptomatic and No Specific COVID-19 Suspicion); No; No; No [[id number]] Collected: 01/06/22 0450    Order Status: Sent Specimen: Not Applicable from Bilateral Anterior Nares +/- OP Swab Updated: 01/06/22 1213  Radiology Results  No results found.     I discussed the patient with Carmin Muskrat, MD from [facility] regarding A&P.    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 The patient has received VRd X 4 (achieving VGPR) then Mel 200 ASCT 05/12/11. 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 She met the criteria for PD on 02/19/2020 and was started on third line DARA PD in June 2021, which was recently held on 10/20/21 due to chronic diarrhea. Patient was transferred OSH after recent hypovolemic/distributive shock. Here for acute encephalopathy workup and acute liver failure workup.      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    June 2020 due to progression of M spike Revlimid was increased to 15 mg PO D1-21 every 28 days, plus dexamethasone  July 2021: started daratumumab, Pomylast 2 mg D 1-21, Q28 days, dexamethasone 12 mg PO Q weekly.   Primary oncologist: Dr. Iona Coach,  Dr. Kennon Rounds [place] [facility]  Receiving dexamethasone 12 mg weekly for diarrhea  Receiving MT with daratumumab with pomalyst and dex, most recent dose 10/16/21, has since been on hold due to worsening diarrhea.     DATA:   09/07/2010: IgG Kappa MM  12/02/2021: Ig M <5, IgG 447, IgA 20, Ig E <2, SFLC Kappa 16.6, Lambda 1.6, Kappa/Lamda 10.38  12/14/21: IgG K on IFE, IgG 430, KLC 16.6, LLC 2.7, KLR 6.15    Chemo:   Holding Daratumumab and Pomalyst, last dose 10/20/21, given recent infection and acute liver failure.    Supportive Care:   Transfusion: Keep Hg >7, and Platelets >10.   Per daughter, has autoantibodies in blood was a difficult match. Would like to be contacted prior to transfusion.   Line: PICC placed 1/26  Outpatient Oncologist: Dr. Kennon Rounds    Dispo: Given improvement in diarrhea and slowly improving Tbili (will take weeks to months per hepatology), plan to transfer back to prior OSH vs placement in SNF    # Immunocompromised  # CMV colitis  # At risk for infections due to malignancy  # Shock, likely hypovolemic vs distributive in the setting of chronic diarrhea, + norovirus infection   # Acute on chronic diarrhea, norovirus positive., diff and OP negative.   -No prior evidence of adrenal insufficiency, AM cortisol levels normal, previously on dexamethasone 8 mg PO Qweekly, but has been held since early December.    Data:  - CT brain: No acute intracranial hemorrhage, herniation, or hydrocephalus.  - CT chest/A/P pending: Medium-sized bilateral pleural effusions with bilateral peribronchovascular groundglass opacities, suggestive of pulmonary edema. Clustered nodules in right middle lobe along the right minor fissure, which could represent mild aspiration  or infection. Numerous sclerotic and mixed lytic/sclerotic lesions throughout the thoracic spine and bilateral ribs, increased in size and number compared to prior. Appearance is atypical for multiple myeloma but may represent a combination of treated, partially treated, and active disease. No acute pathologic fracture. Findings compatible with enterocolitis of infectious or inflammatory etiology. Involvement of the colon is greater than of the small bowel. Marked hepatic steatosis. Increased sclerosis of bone lesions throughout the thoracolumbar spine, bony pelvis, and proximal left femur. Appearance is  atypical for multiple myeloma but may represent a combination of treated, partially treated, and active disease. No acute pathologic fracture.  - 1/29:CMV PCR [id number]  - 1/31: GI PCR panel positive only for Norovirus.   2/1: LP results: Neutrophils 6% in tube 1, Lymphocytes 88% (predominant), protein 42 (normal), glucose 36 (low), rapid HSV in CSF negative. CMV CSF PCR negative    - 2/2 CMV PCR 179,555High   - 2/2 Flex Sig Biopsies prelim: "colonic mucosa with a rare small non-necrotizing mucosal granuloma and rare CMV immunolabeling"   - 2/5 [place] work up: sIL2 (1556.6 high), ferritin (948 high), TG(258 high), NKA-IFN? 10 (WNL)  - 2/8: CMV PCR: 3107 (sig improvement)  - 2/10: C diff: negative.  - 2/12: Repeat GI viral PCR: positive for Norovirus  - 2/15 CMV 755  - 2/17 IgG 745  - 2/22: Repeat GI viral PCR pending   - 2/22 CMV pending     Plan:  - appreciate ID recs  -- Ganciclovir 5 mg/kg (1/31-2/21). S/p one dose of IVIG on 2/7.   -- Valcyte 900 mg BID (2/21 ->)  -- Started on Alinia (nitazoxanide) for 3 days (2/16 - 2/18), prolonged to 1 week course given persistent diarrhea (to end 2/22)  -- Re-testing Norovirus PCR prior to end of Alinia course (ordered for 2/21)  -- s/p IV Zosyn 4.5 g Q8hrs (1/26-1/28, restarted 1/29 - 2/1 for recurrent fever)  - GI consult: appreciate recs: flex sig 2/2  - Weekly CMV  -  Rifaximin dose adjusted to cover for SIBO (2/14 - 2/16 400 mg, 550 mg TID 2/16->)    # Acute liver injury of unclear etiology  # Acute on chronic encephalopathy likely 2/2 infectious vs hepatic vs metabolic  # Hyperbilirubinemia  # Anasarca  # Hypoalbuminemia    Data:   -Steady rise in LFTs in 05/2021,  -12/02/21: AST 152--> 121, ALT 130--> 110, TB 5.4, Albumin 1.1  -Hepatitis panel negative.   -1/26:  Tbili 14.6, GGT 1160, ammonia 25  -CTAP 12/02/21: Scattered sclerotic lesions in the lumbar spine, hepatic steatosis.   -RUQ ultrasound: liver enlarged with diffuse increased echogenicity, consistent with fatty infiltration. 2cm stone in the gallbladder.   -HIDA scan: Normal radiotracer filling of the gallbladder is identified, therefore the exam is not consistent with cystic duct obstruction or acute cholecystitis.  - [place] abd 1/27: Hepatomegaly with diffusely increased echogenicity which can be seen with hepatic amyloidosis or steatosis. Please refer to the recently performed biopsy for further details. Mild perihepatic ascites. Patent hepatic vasculature with normal waveforms. Cholelithiasis without acute cholecystitis. Right pleural effusion.  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 CMV stains: Negative.  - 1/30: Serum studies: Ferritin 2380, iron serum 60, transferrin 46 (low), 93 % sat (high), HEV IgM negative, PCR forVZV negative, HSV negative,CMV+ 443,961High,EBV negative, AMA negative, Hepatitis B core Ab, surface Ab negative, Hep A IgG +/IgM negative  - 2/3 IgG subclass 4: 2 (total 444, subclasses 1/2/3 = 308/25/11 respectively)    Plan:   - Hepatology  recs: 2/7: Path looks unlikely for vanishing bile duct syndrome. Could be drug related. Increase Ursodiol to 600 mg BID, Check Hepatitis E. 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 Unlikely diarrhea from Ursodiol.  - Avoiding steroids  - Daily LFT/INR  - RD consult placed to expand diet to more than protein shakes.  -- Family requesting appetite stimulant but risk > benefit.     # Acute toxic encephalopathy, improving  In the setting of liver failure, ammonia only 25. LP performed 2/1 to r/o encephalitis.     Data:   1/31: HIV, TSH, Folate, B12 WNL  1/30: CT head WNL other than sinus disease  2/1: LP results: Neutrophils 6% in tube 1, Lymphocytes 88% (predominant), protein 42 (normal), glucose 36 (low), rapid HSV in CSF negative. CMV CSF PCR negative  2/5: Brain MRI: Smooth diffuse dural thickening and enhancement as well as bilateral holohemispheric subdural effusions, which may be on the basis of diminished intracranial pressure following recent lumbar puncture.  -2/8: EEG: non-specific mild cerebral dysfunction.  -2/9: CT Brain: similar to prior MRI with hyperdense areas likely hygromas    Plan:  - Neurology consult: 2/7: Encephalopathy likely from underlying medical disease. Delirium prevention. Avoid Ativan and try Seroquel for delirium. MRI findings is from LP. Okay to try caffiene tablets in the daytime to help with postural headache but patient is allergic to it.  -2/8: Repeat CT head to monitor the status of subdural effusion: stable, EEG: diffuse slowing, Thiamine iv prophylaxis.  -Speech path: Regular and thin liquid. Slightly higher risk for aspiration given encephalopathy.  - Start empiric rifaximin given possibility of HE without elevated ammonia (2/14 ->)  -- Rifaximin dose adjusted to cover for SIBO, then de-escalated back to HE coverage on 2/22   -- holding off on lactulose given diarrhea    # Deconditioning  Pt has not been engaging much with PT/OT despite improved mental status. May be due in part to  anxieties about diarrhea/incontinence.  - continue PT/OT  - continue encouragement to participate, particularly by family/familiar faces    # Hypokalemia:  # Hypomagnesemia:  Replete as needed.    # Vitamin D deficiency:  -Supplements ordered.    # Moderate malnutrition:  -Seen by RD and given recs for supplements.    # History of PE, diagnosed in April 2019  - hold home [place] for now given recent anemia and concerns for bleeding.   - monitor for signs and symptoms of worsening bleeding.     # Elevated TSH at OSH, likely 2/2 subclinical hypothyroidism  -TSH WNL on 12/15/20.      # Anxiety, emotional distress:   -Patient has stressful social situation, intellectually disabled son, and regarding underlying disease process.     # Chronic buttocks pressure wound  - Wound care consult, appreciate recs  - Position changes as tolerated, waffle cushion, etc  -- Pressure wound ruled out, treating bilateral buttocks moisture and friction damage   - Rectal tube placed 2/18 to avoid contamination of wounds by stool    Wound care:  - Bilateral Groin/Thighs   Gently cleanse with warm water & Remedy no-rinse foam (pH-balanced cleanser) with soft cloths, pat dry ensuring   thoroughly dried.   Do not use CHG to rash areas.   Apply Clotrimazole ointment to rash BID, ensure thoroughly rubbed in.   Apply a piece of Interdry textile [phone]) leaving 2 inches exposed from fold to facilitate wicking.     - Bilateral Buttocks   First rinse with perineal irrigation bottle & warm water to remove as much stool as possible to minimize wiping.   Cleanse wounds & surrounding skin with  Anasept wound cleanser spray (PMM#[id number]), pat dry with sterile gauze.   Apply a thick layer of Triad hydrophilic wound dressing paste (NRW#[id number]) over wounds & periwound areas.   Promptly clean patient after episodes of incontinence using method described.   Remove only the top, soiled layer of Triad during incontinence cleaning to avoid further eroding  the skin & apply another top   layer of Triad following cleaning.   Apply Triad at least BID & PRN to keep area protected.     - Pressure Injury Prevention   Continue q2h repositioning- 30 degree lateral turn to L & R only unless medically contraindicated to avoid direct pressure on   sacrum.   Place Waffle mattress overlay [phone]) under patient in bed to assist with pressure redistribution.   Use waffle cushion while OOB to chair, reposition frequently while in chair every 30 minutes.   Additionally, float heels completely off of bed. If pillows do not suffice, order heel offloading boots (PMM# [id number]).      # VTE PPx:  Contraindicated: Other: anticipate Plt <50    Severity of Illness  Stable     During this hospitalization the patient is also being treated for:  - Sepsis (present on admission)    - Malignancy associated fatigue on admission  - Neoplasm/malignancy associated pain  - Hypoalbuminemia (present on admission)  - Fluid status: Hypovolemia on admission     - Cachexia  - Encephalopathy: Metabolic encephalopathy     Code Status: [place], MD  01/06/22

## 2022-01-07 LAB — BASIC METABOLIC PANEL (NA, K, CL, CO2, BUN, CR, GLU, CA)
Anion Gap: 5 (ref 4–14)
Calcium, total, Serum / Plasma: 7.3 mg/dL — ABNORMAL LOW (ref 8.4–10.5)
Carbon Dioxide, Total: 22 mmol/L (ref 22–29)
Chloride, Serum / Plasma: 112 mmol/L — ABNORMAL HIGH (ref 101–110)
Creatinine: 0.28 mg/dL — ABNORMAL LOW (ref 0.55–1.02)
Glucose, non-fasting: 101 mg/dL (ref 70–199)
Potassium, Serum / Plasma: 3.5 mmol/L (ref 3.5–5.0)
Sodium, Serum / Plasma: 139 mmol/L (ref 135–145)
Urea Nitrogen, Serum / Plasma: 3 mg/dL — ABNORMAL LOW (ref 7–25)
eGFRcr: 123 mL/min/{1.73_m2} (ref >59)

## 2022-01-07 LAB — TYPE AND SCREEN
ABO/RH(D): O POS
Antibody Screen: NEGATIVE
Blood Product Expiration Date: 202303012359
Blood Product Issue Date/Time: 202302221035
UDIV: 0
Unit Type and Rh: 5100
Unit Type and Rh: POSITIVE

## 2022-01-07 LAB — COMPLETE BLOOD COUNT WITH DIFFERENTIAL
Abs Basophils: 0.01 10*9/L (ref 0.00–0.10)
Abs Eosinophils: 0.01 10*9/L (ref 0.00–0.40)
Abs Imm Granulocytes: 0.06 10*9/L (ref <0.10)
Abs Lymphocytes: 0.23 10*9/L — ABNORMAL LOW (ref 1.00–3.40)
Abs Monocytes: 0.1 10*9/L — ABNORMAL LOW (ref 0.20–0.80)
Abs Neutrophils: 3.28 10*9/L (ref 1.80–6.80)
Hematocrit: 27.9 % — ABNORMAL LOW (ref 36.0–46.0)
Hemoglobin: 9.6 g/dL — ABNORMAL LOW (ref 12.0–15.5)
MCH: 35 pg — ABNORMAL HIGH (ref 26.0–34.0)
MCHC: 34.4 g/dL (ref 31.0–36.0)
MCV: 102 fL — ABNORMAL HIGH (ref 80–100)
MPV: 13.2 fL — ABNORMAL HIGH (ref 9.1–12.6)
Platelet Count: 60 10*9/L — ABNORMAL LOW (ref 140–450)
RBC Count: 2.74 10*12/L — ABNORMAL LOW (ref 4.00–5.20)
RDW-CV: UNDETERMINED % (ref 11.7–14.4)
WBC Count: 3.7 10*9/L (ref 3.4–10.0)

## 2022-01-07 LAB — COMPREHENSIVE METABOLIC PANEL (BMP, AST, ALT, T.BILI, ALKP, TP ALB)
AST: 82 U/L — ABNORMAL HIGH (ref 5–44)
Alanine transaminase: 64 U/L — ABNORMAL HIGH (ref 10–61)
Albumin, Serum / Plasma: 1.2 g/dL — ABNORMAL LOW (ref 3.4–4.8)
Alkaline Phosphatase: 409 U/L — ABNORMAL HIGH (ref 38–108)
Anion Gap: 4 (ref 4–14)
Bilirubin, Total: 11.1 mg/dL — ABNORMAL HIGH (ref 0.2–1.2)
Calcium, total, Serum / Plasma: 7.3 mg/dL — ABNORMAL LOW (ref 8.4–10.5)
Carbon Dioxide, Total: 23 mmol/L (ref 22–29)
Chloride, Serum / Plasma: 112 mmol/L — ABNORMAL HIGH (ref 101–110)
Creatinine: 0.31 mg/dL — ABNORMAL LOW (ref 0.55–1.02)
Glucose, non-fasting: 84 mg/dL (ref 70–199)
Potassium, Serum / Plasma: 2.8 mmol/L — CL (ref 3.5–5.0)
Protein, Total, Serum / Plasma: 3.2 g/dL — ABNORMAL LOW (ref 6.3–8.6)
Sodium, Serum / Plasma: 139 mmol/L (ref 135–145)
Urea Nitrogen, Serum / Plasma: 3 mg/dL — ABNORMAL LOW (ref 7–25)
eGFRcr: 120 mL/min/{1.73_m2} (ref >59)

## 2022-01-07 LAB — COVID-19 RNA, RT-PCR/NUCLEIC ACID AMPLIFICATION: COVID-19 RNA, RT-PCR/Nucleic Acid Amplification: NOT DETECTED

## 2022-01-07 LAB — BILIRUBIN, DIRECT: Bilirubin, Direct: 7.8 mg/dL — ABNORMAL HIGH (ref <0.6)

## 2022-01-07 LAB — PROTHROMBIN TIME
INR: 1.4 — ABNORMAL HIGH (ref 0.9–1.2)
PT: 16.6 s — ABNORMAL HIGH (ref 11.6–15.0)

## 2022-01-07 LAB — MAGNESIUM, SERUM / PLASMA: Magnesium, Serum / Plasma: 2 mg/dL (ref 1.6–2.6)

## 2022-01-07 LAB — FIBRINOGEN, FUNCTIONAL: Fibrinogen, Functional: 156 mg/dL — ABNORMAL LOW (ref 202–430)

## 2022-01-07 MED ORDER — VALGANCICLOVIR 450 MG TABLET
450 mg | Freq: Two times a day (BID) | ORAL | Status: DC
Start: 2022-01-07 — End: 2022-01-15
  Administered 2022-01-08: 05:00:00 450 mg via ORAL
  Administered 2022-01-08: 17:00:00 via ORAL
  Administered 2022-01-09 (×2): 450 mg via ORAL
  Administered 2022-01-10: 16:00:00 via ORAL
  Administered 2022-01-10 – 2022-01-11 (×2): 450 mg via ORAL
  Administered 2022-01-11: 16:00:00 via ORAL
  Administered 2022-01-12 – 2022-01-13 (×4): 450 mg via ORAL
  Administered 2022-01-14 (×2): via ORAL
  Administered 2022-01-15 (×2): 450 mg via ORAL

## 2022-01-07 MED ORDER — POTASSIUM CHLORIDE 20 MEQ/50 ML IN STERILE WATER INTRAVENOUS PIGGYBACK
20 | Freq: Once | INTRAVENOUS | Status: AC
Start: 2022-01-07 — End: 2022-01-07
  Administered 2022-01-07: 18:00:00 via INTRAVENOUS

## 2022-01-07 MED ORDER — ALUMINUM-MAG HYDROXIDE-SIMETHICONE 200 MG-200 MG-20 MG/5 ML ORAL SUSP
200-200-20 | Freq: Once | ORAL | Status: AC
Start: 2022-01-07 — End: 2022-01-07
  Administered 2022-01-07: via ORAL

## 2022-01-07 MED FILL — XIFAXAN 550 MG TABLET: 550 mg | ORAL | Qty: 1

## 2022-01-07 MED FILL — THIAMINE HCL (VITAMIN B1) 100 MG/ML INJECTION SOLUTION: 100 mg/mL | INTRAMUSCULAR | Qty: 2

## 2022-01-07 MED FILL — VALGANCICLOVIR 450 MG TABLET: 450 mg | ORAL | Qty: 2

## 2022-01-07 MED FILL — POTASSIUM CHLORIDE 40 MEQ/100ML IN STERILE WATER INTRAVENOUS PIGGYBACK: 40 mEq/100 mL | INTRAVENOUS | Qty: 100

## 2022-01-07 MED FILL — DICYCLOMINE 10 MG CAPSULE: 10 mg | ORAL | Qty: 2

## 2022-01-07 MED FILL — URSODIOL (BULK) 100 % POWDER: 100 % | Qty: 0.6

## 2022-01-07 MED FILL — ANTI-DIARRHEAL (LOPERAMIDE) 2 MG TABLET: 2 mg | ORAL | Qty: 2

## 2022-01-07 MED FILL — VITAMIN E (DL, ACETATE) 45 MG (100 UNIT) CAPSULE: 45 mg (100 unit) | ORAL | Qty: 8

## 2022-01-07 MED FILL — MELATONIN 1 MG TABLET: 1 mg | ORAL | Qty: 1

## 2022-01-07 MED FILL — ONDANSETRON HCL (PF) 4 MG/2 ML INJECTION SOLUTION: 4 mg/2 mL | INTRAMUSCULAR | Qty: 4

## 2022-01-07 MED FILL — MAG-AL PLUS 200 MG-200 MG-20 MG/5 ML ORAL SUSPENSION: 200-200-20 mg/5 mL | ORAL | Qty: 30

## 2022-01-07 MED FILL — CHOLECALCIFEROL (VITAMIN D3) 25 MCG (1,000 UNIT) TABLET: 1000 UNITS | ORAL | Qty: 4

## 2022-01-07 MED FILL — POTASSIUM CHLORIDE 20 MEQ/50 ML IN STERILE WATER INTRAVENOUS PIGGYBACK: 20 mEq/50 mL | INTRAVENOUS | Qty: 50

## 2022-01-07 MED FILL — FLINTSTONES COMPLETE (IRON) CHEWABLE TABLET: ORAL | Qty: 1

## 2022-01-07 NOTE — Consults (Signed)
PHYSICAL THERAPY TREATMENT NOTE    PT relevant HPI       Medical Update:        ASSESSMENT  Attempted to see pt 3x today.  Pt decined OOB "not today". Pt's dtr was ed on bed exs - handout issued. Dtr to assist pt w/ exs daily when she visits.   Extensive education on importance of upright activity and OOB activity to improve patient's functional mobility.  Discussed goals that pt has - pt was not able to verbalize. When asked if she wants therapy to return, pt said "yes" tomorrow at 2pm.    Focus next session: upright tolerance, OOB, therex. Try standing in [place] .    RECOMMENDATIONS  DISCHARGE RECOMMENDATION  Comments Placement with ongoing PT needs  Pending medical stability.   Patient Current Functional Status Sufficient for PT Discharge Recommendation Yes   Discharge DME needs TBD pending patient progress.   Discharge transportation needs [place]  Inpatient Rehab Assistive Device Recommendation Vertical dependent lift;Ceiling lift   Activity Recommendation Bed in chair position, OOB to chair with ceiling lift     SUBJECTIVE  Subjective report:  Attempted to see pt 3x today.  11, 1pm then 3:50pm.  Pt declined OOB "not today" even w/ max encouragement. Seen for bed ther ex ed to dtr.  Notable observations:  Received and left in bed.  All needs at reach and Rn informed.  Pt's dtr and and son present.    SYSTEMS REVIEW  Cognition/Communication  Delirium screen:  Yes Delirium Screen:   Details:  Encephalopathic    Communication  Cognition/communication impaired:  Yes     Orientation: Disoriented to time  Cognition: Decreased insight to deficits  Behavior: Self-limiting  Behavior comment: low motivation    Integumentary  Integumentary deficits:  YesIntegumentary deficit detail:  Jaundiced, edematous, wounds at buttocks (not observed), foley    Cardiopulmonary  Cardiopulmonary deficits:  Yes  Detail:  Decreased activity tolerance    Neuromuscular  Neuromuscular deficits:   Yes  Motor control or coordination: Impaired at bilateral lower extremities >> bilateral upper extremities        Pain                COMPREHENSIVE MOVEMENT ANALYSIS/TREATMENT  Precautions/WB status:      Functional Mobility     Balance  Balance deficits noted:    Exercise/other interventions  Supine exercises: Ankle pumps;Heel slides;Short arc quad;Hip abd/add;Quad sets  Supine exercises comment (other exercise, details): and glut sets.  Ed bed ther ex to dtr.  Hadnout issued.  Dtr to assist pt w/ exs daily when they visit.  Other exercise: Extensive education on importance of upright activity and OOB activity to improve patient's functional mobility.  Discussed goals that pt has - pt was not able to verbalize. When asked if she wants therapy to return, pt said "yes" tomorrow at 2pm.    Communication between other health care providers: Communication between other health care provider: OT;RN  Communication comment: patient's status     Education assessment  Learner: Patient, Caregiver  Content: Plan of care, Activity recommendations  Response: Needs reinforcement  Outcome measures   Physical Therapist Global Assessment of Mobility  Activity Achieved: Bed Exercises or Sitting edge of bed <5 minutes, cycle or Active ROM  AMPAC 6-clicks basic mobility score: 9      PLAN  Plan of care status:  Current plan of care remains appropriate  PT frequency:  3x/week  PT duration:  4 weeks.  Comment:  POC expires 2/26      Planned PT interventions:   Specific interventions: Progressive functional mobility training;Gait training;Balance training;NM Re-ed;Aerobic training;Ther ex  Education interventions: Caregiver training;Benefits of activity;Self-pacing/breathing;Exercise program;Mobility with precautions compliance;Fall risk reduction  Comment:          Craig Staggers, MPT    01/07/2022

## 2022-01-07 NOTE — Plan of Care (Signed)
Problem: Fall, at Risk or Actual - Adult  Goal: Absence of falls and fall related injury  Outcome: Progress within 12 hours     Problem: Delirium - Adult / Pediatric  Goal: Absence or resolution of delirium  Outcome: Progress within 12 hours     Problem: Pain,  Acute / Chronic- Adult  Goal: Control of chronic pain  Outcome: Progress within 12 hours

## 2022-01-07 NOTE — Progress Notes (Signed)
[facility] PROGRESS NOTE  ATTENDING ONLY     My date of service is 01/07/22.    24 Hour Course  GI viral pathogen still pending     Subjective  Reports having abdominal discomfort. Ordered maalox     Vitals  Temp:  [36.5 C (97.7 F)-37.2 C (99 F)] 37.1 C (98.8 F)  Heart Rate:  [71-77] 75  *Resp:  [16-20] 18  BP: (101-134)/(70-86) 101/70  SpO2:  [96 %-98 %] 97 %    MostRecent Weight: 89.5 kg (197 lb 5 oz)  Admission Weight: 79.6 kg (175 lb 7.8 oz)      Intake/Output Summary (Last 24 hours) at 01/07/2022 1518  Last data filed at 01/07/2022 1400  Gross per 24 hour   Intake 2673 ml   Output 1500 ml   Net 1173 ml     Pain Score: 0    Physical Exam  Vitals reviewed.   Constitutional:       Appearance: She is ill-appearing.   HENT:      Head: Normocephalic and atraumatic.      Nose: Nose normal.      Mouth/Throat:      Mouth: Mucous membranes are moist.      Pharynx: Oropharynx is clear.   Eyes:      General: Scleral icterus present.      Extraocular Movements: Extraocular movements intact.      Pupils: Pupils are equal, round, and reactive to light.   Cardiovascular:      Rate and Rhythm: Normal rate and regular rhythm.   Pulmonary:      Effort: Pulmonary effort is normal. No respiratory distress.   Abdominal:      General: Abdomen is flat. There is no distension.      Palpations: Abdomen is soft.      Tenderness: There is no abdominal tenderness.   Musculoskeletal:      Cervical back: Normal range of motion and neck supple.      Right lower leg: No edema.      Left lower leg: No edema.   Skin:     General: Skin is warm and dry.      Coloration: Skin is jaundiced.   Neurological:      Mental Status: She is alert and oriented to person, place, and time.   Psychiatric:         Mood and Affect: Mood normal.         Behavior: Behavior is slowed.     Scheduled Meds:   0.9% sodium chloride flush  3 mL Intravenous Q12H [place]    cholecalciferol (vitamin D3)  4,000 Units Oral Daily [place]    clotrimazole    Topical BID SCH    dicyclomine  20 mg Oral 4x Daily [place]    dry mouth oral rinse  5 mL Mucous Membrane 4x Daily [place]    melatonin  6 mg Oral Daily At Bedtime [facility]    multivitamin complete chewable  1 tablet Oral Daily [place]    QUEtiapine  50 mg Oral Once    rifAXIMin  550 mg Oral BID [place]    thiamine  200 mg Intravenous Q24H    ursodiol  600 mg Oral BID [facility]    valGANciclovir  450 mg Oral BID [place]    vitamin E (dl, acetate)  800 Units Oral Daily [place]     Continuous Infusions:   dextrose 5 % and 0.9 % sodium chloride 75 mL/hr (01/07/22 1408)  PRN Meds:   0.9% sodium chloride flush  3 mL Intravenous PRN    albuterol  2.5 mg Nebulization Q4H PRN    Or    albuterol  2.5 mg Nebulization Q4H PRN    HYDROmorphone  0.3-0.8 mg Intravenous Q4H PRN    hydrOXYzine  10 mg Oral Q8H PRN    loperamide  4 mg Oral 4x Daily PRN    magnesium sulfate in dextrose 5 %  2-4 g Intravenous Daily PRN    Or    magnesium sulfate in water  2-4 g Intravenous Daily PRN    ondansetron  8 mg Intravenous Q8H PRN    polyethylene glycol  17 g Oral Daily PRN    potassium chloride in sterile water  20-80 mEq Intravenous Daily PRN    Or    potassium chloride in sterile water  20-80 mEq Intravenous Daily PRN    Or    potassium chloride  20-80 mEq Oral Daily PRN     Data    CBC        01/07/22  0309   WBC 3.7   HGB 9.6*   HCT 27.9*   PLT 60*     Coags        01/07/22  0309   INR 1.4*     Chem7        01/07/22  0309 01/06/22  1519 01/06/22  0450   NA 139 139 141   K 2.8* 3.5 3.1*   CL 112* 112* 115*   CO2 23 22 21*   BUN 3* 3* 3*   CREAT 0.31* 0.28* 0.29*   GLU 84 101 81     Electrolytes        01/07/22  0309 01/06/22  1519 01/06/22  0450   CA 7.3* 7.3* 7.0*   MG 2.0  --   --      Liver Panel        01/07/22  0309   AST 82*   ALT 64*   ALKP 409*   TBILI 11.1*   TP 3.2*   ALB 1.2*     Lactate  No results found in last 36 hours    Microbiology Results (last 24 hours)     Procedure Component Value Units Date/Time    COVID-19 RNA,  RT-PCR/Nucleic Acid Amplification RETEST (Asymptomatic and No Specific COVID-19 Suspicion); No; No; No [[id number]] Collected: 01/06/22 0450    Order Status: Completed Specimen: Not Applicable from Bilateral Anterior Nares +/- OP Swab Updated: 01/06/22 2057     COVID-19 RNA, RT-PCR/Nucleic Acid Amplification Not detected     Comment: (Reported by Lab to [place].)  (Results reported to [facility])          Comments See Comment     Comment: in universal transport medium           Radiology Results  No results found.     I discussed the patient with Carmin Muskrat, MD from [facility] regarding A&P.    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 The patient has received VRd X 4 (achieving VGPR) then Mel 200 ASCT 05/12/11. 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 She met the criteria for PD on 02/19/2020 and was started on third line DARA PD in June 2021, which was recently held on 10/20/21 due to chronic diarrhea. Patient was transferred OSH after recent  hypovolemic/distributive shock. Here for acute encephalopathy workup and acute liver failure workup.      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   June 2020 due to progression of M spike Revlimid was increased to 15 mg PO D1-21 every 28 days, plus dexamethasone  July 2021: started daratumumab, Pomylast 2 mg D 1-21, Q28 days, dexamethasone 12 mg PO Q weekly.   Primary oncologist: Dr. Iona Coach,  Dr. Kennon Rounds [facility]  Receiving dexamethasone 12 mg weekly for diarrhea  Receiving MT with daratumumab with pomalyst and dex, most recent dose 10/16/21, has since been on hold due to worsening diarrhea.     DATA:   09/07/2010: IgG Kappa MM  12/02/2021: Ig M <5, IgG 447,  IgA 20, Ig E <2, SFLC Kappa 16.6, Lambda 1.6, Kappa/Lamda 10.38  12/14/21: IgG K on IFE, IgG 430, KLC 16.6, LLC 2.7, KLR 6.15    Chemo:   Holding Daratumumab and Pomalyst, last dose 10/20/21, given recent infection and acute liver failure.    Supportive Care:   Transfusion: Keep Hg >7, and Platelets >10.   Per daughter, has autoantibodies in blood was a difficult match. Would like to be contacted prior to transfusion.   Line: PICC placed 1/26    Outpatient Oncologist: Dr. Kennon Rounds    Dispo: Given improvement in diarrhea and slowly improving Tbili (will take weeks to months per hepatology), plan to transfer back to prior OSH vs placement in SNF    # Immunocompromised  # CMV colitis  # At risk for infections due to malignancy  # Shock, likely hypovolemic vs distributive in the setting of chronic diarrhea, + norovirus infection   # Acute on chronic diarrhea, norovirus positive., diff and OP negative.   -No prior evidence of adrenal insufficiency, AM cortisol levels normal, previously on dexamethasone 8 mg PO Qweekly, but has been held since early December.    Data:  - CT brain: No acute intracranial hemorrhage, herniation, or hydrocephalus.  - CT chest/A/P pending: Medium-sized bilateral pleural effusions with bilateral peribronchovascular groundglass opacities, suggestive of pulmonary edema. Clustered nodules in right middle lobe along the right minor fissure, which could represent mild aspiration or infection. Numerous sclerotic and mixed lytic/sclerotic lesions throughout the thoracic spine and bilateral ribs, increased in size and number compared to prior. Appearance is atypical for multiple myeloma but may represent a combination of treated, partially treated, and active disease. No acute pathologic fracture. Findings compatible with enterocolitis of infectious or inflammatory etiology. Involvement of the colon is greater than of the small bowel. Marked hepatic steatosis. Increased sclerosis of bone lesions  throughout the thoracolumbar spine, bony pelvis, and proximal left femur. Appearance is atypical for multiple myeloma but may represent a combination of treated, partially treated, and active disease. No acute pathologic fracture.  - 1/29:CMV PCR 710,626RSWN  - 1/31: GI PCR panel positive only for Norovirus.   2/1: LP results: Neutrophils 6% in tube 1, Lymphocytes 88% (predominant), protein 42 (normal), glucose 36 (low), rapid HSV in CSF negative. CMV CSF PCR negative    - 2/2 CMV PCR 179,555High   - 2/2 Flex Sig Biopsies prelim: "colonic mucosa with a rare small non-necrotizing mucosal granuloma and rare CMV immunolabeling"   - 2/5 [place] work up: sIL2 (1556.6 high), ferritin (948 high), TG(258  high), NKA-IFN? 10 (WNL)  - 2/8: CMV PCR: 3107 (sig improvement)  - 2/10: C diff: negative.  - 2/12: Repeat GI viral PCR: positive for Norovirus  - 2/15 CMV 755  - 2/17 IgG 745  - 2/22: Repeat GI viral PCR pending   - 2/22 CMV 74    Plan:  - appreciate ID recs  -- Ganciclovir 5 mg/kg (1/31-2/21). S/p one dose of IVIG on 2/7.   -- S/p Valcyte 900 mg BID (2/21 -> 2/23) -> transitioned to maintenance Valcyte 434m BID   -- Started on Alinia (nitazoxanide) for 3 days (2/16 - 2/18), prolonged to 1 week course given persistent diarrhea (to end 2/22)  -- Re-testing Norovirus PCR   -- s/p IV Zosyn 4.5 g Q8hrs (1/26-1/28, restarted 1/29 - 2/1 for recurrent fever)  - GI consult: appreciate recs: flex sig 2/2  - Weekly CMV  - Rifaximin dose adjusted to cover for SIBO (2/14 - 2/16 400 mg, 550 mg TID 2/16->)    # Acute liver injury of unclear etiology  # Acute on chronic encephalopathy likely 2/2 infectious vs hepatic vs metabolic  # Hyperbilirubinemia  # Anasarca  # Hypoalbuminemia    Data:   -Steady rise in LFTs in 05/2021,  -12/02/21: AST 152--> 121, ALT 130--> 110, TB 5.4, Albumin 1.1  -Hepatitis panel negative.   -1/26:  Tbili 14.6, GGT 1160, ammonia 25  -CTAP 12/02/21: Scattered sclerotic lesions in the lumbar spine, hepatic  steatosis.   -RUQ ultrasound: liver enlarged with diffuse increased echogenicity, consistent with fatty infiltration. 2cm stone in the gallbladder.   -HIDA scan: Normal radiotracer filling of the gallbladder is identified, therefore the exam is not consistent with cystic duct obstruction or acute cholecystitis.  - [place] 1/27: Hepatomegaly with diffusely increased echogenicity which can be seen with hepatic amyloidosis or steatosis. Please refer to the recently performed biopsy for further details. Mild perihepatic ascites. Patent hepatic vasculature with normal waveforms. Cholelithiasis without acute cholecystitis. Right pleural effusion.  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 CMV stains: Negative.  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 Could be drug related. Increase Ursodiol to 600 mg BID, Check Hepatitis E. 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 Unlikely diarrhea from Ursodiol.  - Avoiding steroids  - Daily LFT/INR  - RD consult placed to expand diet to more than protein shakes.  -- Family requesting appetite stimulant but risk > benefit.     # Acute toxic encephalopathy, improving  In the setting of liver failure, ammonia only 25. LP performed 2/1 to r/o encephalitis.     Data:    1/31: HIV, TSH, Folate, B12 WNL  1/30: CT head WNL other than sinus disease  2/1: LP results: Neutrophils 6% in tube 1, Lymphocytes 88% (predominant), protein 42 (normal), glucose 36 (low), rapid HSV  in CSF negative. CMV CSF PCR negative  2/5: Brain MRI: Smooth diffuse dural thickening and enhancement as well as bilateral holohemispheric subdural effusions, which may be on the basis of diminished intracranial pressure following recent lumbar puncture.  -2/8: EEG: non-specific mild cerebral dysfunction.  -2/9: CT Brain: similar to prior MRI with hyperdense areas likely hygromas    Plan:  - Neurology consult: 2/7: Encephalopathy likely from underlying medical disease. Delirium prevention. Avoid Ativan and try Seroquel for delirium. MRI findings is from LP. Okay to try caffiene tablets in the daytime to help with postural headache but patient is allergic to it.  -2/8: Repeat CT head to monitor the status of subdural effusion: stable, EEG: diffuse slowing, Thiamine iv prophylaxis.  -Speech path: Regular and thin liquid. Slightly higher risk for aspiration given encephalopathy.  - Start empiric rifaximin given possibility of HE without elevated ammonia (2/14 ->)  -- Rifaximin dose adjusted to cover for SIBO, then de-escalated back to HE coverage on 2/22   -- holding off on lactulose given diarrhea    # Deconditioning  Pt has not been engaging much with PT/OT despite improved mental status. May be due in part to anxieties about diarrhea/incontinence.  - continue PT/OT  - continue encouragement to participate, particularly by family/familiar faces    # Hypokalemia:  # Hypomagnesemia:  Replete as needed.    # Vitamin D deficiency:  -Supplements ordered.    # Moderate malnutrition:  -Seen by RD and given recs for supplements.    # History of PE, diagnosed in April 2019  - hold home [place] for now given recent anemia and concerns for bleeding.   - monitor for signs and symptoms of worsening bleeding.     # Elevated TSH  at OSH, likely 2/2 subclinical hypothyroidism  -TSH WNL on 12/15/20.      # Anxiety, emotional distress:   -Patient has stressful social situation, intellectually disabled son, and regarding underlying disease process.     # Chronic buttocks pressure wound  - Wound care consult, appreciate recs  - Position changes as tolerated, waffle cushion, etc  -- Pressure wound ruled out, treating bilateral buttocks moisture and friction damage   - Rectal tube placed 2/18 to avoid contamination of wounds by stool    Wound care:  - Bilateral Groin/Thighs   Gently cleanse with warm water & Remedy no-rinse foam (pH-balanced cleanser) with soft cloths, pat dry ensuring   thoroughly dried.   Do not use CHG to rash areas.   Apply Clotrimazole ointment to rash BID, ensure thoroughly rubbed in.   Apply a piece of Interdry textile [phone]) leaving 2 inches exposed from fold to facilitate wicking.     - Bilateral Buttocks   First rinse with perineal irrigation bottle & warm water to remove as much stool as possible to minimize wiping.   Cleanse wounds & surrounding skin with Anasept wound cleanser spray (ZHG#[id number]), pat dry with sterile gauze.   Apply a thick layer of Triad hydrophilic wound dressing paste (STM#[id number]) over wounds & periwound areas.   Promptly clean patient after episodes of incontinence using method described.   Remove only the top, soiled layer of Triad during incontinence cleaning to avoid further eroding the skin & apply another top   layer of Triad following cleaning.   Apply Triad at least BID & PRN to keep area protected.     - Pressure Injury Prevention   Continue q2h repositioning- 30 degree lateral turn to L & R only  unless medically contraindicated to avoid direct pressure on   sacrum.   Place Waffle mattress overlay [phone]) under patient in bed to assist with pressure redistribution.   Use waffle cushion while OOB to chair, reposition frequently while in chair every 30 minutes.   Additionally, float  heels completely off of bed. If pillows do not suffice, order heel offloading boots (PMM# [id number]).      # VTE PPx:  Contraindicated: Other: anticipate Plt <50    Severity of Illness  Stable     During this hospitalization the patient is also being treated for:  - Sepsis (present on admission)    - Malignancy associated fatigue on admission  - Neoplasm/malignancy associated pain  - Hypoalbuminemia (present on admission)  - Fluid status: Hypovolemia on admission     - Cachexia  - Encephalopathy: Metabolic encephalopathy     Code Status: [place], MD  01/07/22

## 2022-01-08 LAB — BASIC METABOLIC PANEL (NA, K, CL, CO2, BUN, CR, GLU, CA)
Anion Gap: 4 (ref 4–14)
Calcium, total, Serum / Plasma: 7.6 mg/dL — ABNORMAL LOW (ref 8.4–10.5)
Carbon Dioxide, Total: 22 mmol/L (ref 22–29)
Chloride, Serum / Plasma: 114 mmol/L — ABNORMAL HIGH (ref 101–110)
Creatinine: 0.29 mg/dL — ABNORMAL LOW (ref 0.55–1.02)
Glucose, non-fasting: 87 mg/dL (ref 70–199)
Potassium, Serum / Plasma: 4.3 mmol/L (ref 3.5–5.0)
Sodium, Serum / Plasma: 140 mmol/L (ref 135–145)
Urea Nitrogen, Serum / Plasma: 3 mg/dL — ABNORMAL LOW (ref 7–25)
eGFRcr: 122 mL/min/{1.73_m2} (ref >59)

## 2022-01-08 LAB — COMPLETE BLOOD COUNT WITH DIFFERENTIAL
Abs Basophils: 0.01 10*9/L (ref 0.00–0.10)
Abs Eosinophils: 0.01 10*9/L (ref 0.00–0.40)
Abs Imm Granulocytes: 0.06 10*9/L (ref <0.10)
Abs Lymphocytes: 0.26 10*9/L — ABNORMAL LOW (ref 1.00–3.40)
Abs Monocytes: 0.16 10*9/L — ABNORMAL LOW (ref 0.20–0.80)
Abs Neutrophils: 3.6 10*9/L (ref 1.80–6.80)
Hematocrit: 28.3 % — ABNORMAL LOW (ref 36.0–46.0)
Hemoglobin: 9.5 g/dL — ABNORMAL LOW (ref 12.0–15.5)
MCH: 34.1 pg — ABNORMAL HIGH (ref 26.0–34.0)
MCHC: 33.6 g/dL (ref 31.0–36.0)
MCV: 101 fL — ABNORMAL HIGH (ref 80–100)
MPV: 12.4 fL (ref 9.1–12.6)
Platelet Count: 61 10*9/L — ABNORMAL LOW (ref 140–450)
RBC Count: 2.79 10*12/L — ABNORMAL LOW (ref 4.00–5.20)
RDW-CV: UNDETERMINED % (ref 11.7–14.4)
WBC Count: 4.1 10*9/L (ref 3.4–10.0)

## 2022-01-08 LAB — COMPREHENSIVE METABOLIC PANEL (BMP, AST, ALT, T.BILI, ALKP, TP ALB)
AST: 77 U/L — ABNORMAL HIGH (ref 5–44)
Alanine transaminase: 64 U/L — ABNORMAL HIGH (ref 10–61)
Albumin, Serum / Plasma: 1.2 g/dL — ABNORMAL LOW (ref 3.4–4.8)
Alkaline Phosphatase: 418 U/L — ABNORMAL HIGH (ref 38–108)
Anion Gap: 3 — ABNORMAL LOW (ref 4–14)
Bilirubin, Total: 11.6 mg/dL — ABNORMAL HIGH (ref 0.2–1.2)
Calcium, total, Serum / Plasma: 7.5 mg/dL — ABNORMAL LOW (ref 8.4–10.5)
Carbon Dioxide, Total: 24 mmol/L (ref 22–29)
Chloride, Serum / Plasma: 114 mmol/L — ABNORMAL HIGH (ref 101–110)
Creatinine: 0.34 mg/dL — ABNORMAL LOW (ref 0.55–1.02)
Glucose, non-fasting: 80 mg/dL (ref 70–199)
Potassium, Serum / Plasma: 3.3 mmol/L — ABNORMAL LOW (ref 3.5–5.0)
Protein, Total, Serum / Plasma: 3.3 g/dL — ABNORMAL LOW (ref 6.3–8.6)
Sodium, Serum / Plasma: 141 mmol/L (ref 135–145)
Urea Nitrogen, Serum / Plasma: 2 mg/dL — ABNORMAL LOW (ref 7–25)
eGFRcr: 117 mL/min/{1.73_m2} (ref >59)

## 2022-01-08 LAB — PROTHROMBIN TIME
INR: 1.4 — ABNORMAL HIGH (ref 0.9–1.2)
PT: 16.7 s — ABNORMAL HIGH (ref 11.6–15.0)

## 2022-01-08 LAB — GI VIRAL PANEL PCR
Adenovirus 40/41: NOT DETECTED
Comments: NOT DETECTED
Norovirus GI/GII: DETECTED — AB
Rotavirus A: NOT DETECTED

## 2022-01-08 LAB — MAGNESIUM, SERUM / PLASMA: Magnesium, Serum / Plasma: 1.8 mg/dL (ref 1.6–2.6)

## 2022-01-08 LAB — FIBRINOGEN, FUNCTIONAL: Fibrinogen, Functional: 174 mg/dL — ABNORMAL LOW (ref 202–430)

## 2022-01-08 LAB — BILIRUBIN, DIRECT: Bilirubin, Direct: 8.1 mg/dL — ABNORMAL HIGH (ref <0.6)

## 2022-01-08 MED FILL — ANTI-DIARRHEAL (LOPERAMIDE) 2 MG TABLET: 2 mg | ORAL | Qty: 2

## 2022-01-08 MED FILL — THIAMINE HCL (VITAMIN B1) 100 MG/ML INJECTION SOLUTION: 100 mg/mL | INTRAMUSCULAR | Qty: 2

## 2022-01-08 MED FILL — DICYCLOMINE 10 MG CAPSULE: 10 mg | ORAL | Qty: 2

## 2022-01-08 MED FILL — POTASSIUM CHLORIDE 20 MEQ/50 ML IN STERILE WATER INTRAVENOUS PIGGYBACK: 20 mEq/50 mL | INTRAVENOUS | Qty: 50

## 2022-01-08 MED FILL — MAGNESIUM SULFATE 1 GRAM/100 ML IN DEXTROSE 5 % INTRAVENOUS PIGGYBACK: 1 g/00 mL | INTRAVENOUS | Qty: 200

## 2022-01-08 MED FILL — VALGANCICLOVIR 450 MG TABLET: 450 mg | ORAL | Qty: 1

## 2022-01-08 MED FILL — MELATONIN 1 MG TABLET: 1 mg | ORAL | Qty: 1

## 2022-01-08 MED FILL — XIFAXAN 550 MG TABLET: 550 mg | ORAL | Qty: 1

## 2022-01-08 MED FILL — DILAUDID (PF) 0.5 MG/0.5 ML INJECTION SYRINGE: 0.5 mg/ mL | INTRAMUSCULAR | Qty: 0.5

## 2022-01-08 MED FILL — FLINTSTONES COMPLETE (IRON) CHEWABLE TABLET: ORAL | Qty: 1

## 2022-01-08 MED FILL — MAGNESIUM SULFATE 2 GRAM/50 ML (4 %) IN WATER INTRAVENOUS PIGGYBACK: 2 gram/50 mL (4 %) | INTRAVENOUS | Qty: 50

## 2022-01-08 MED FILL — URSODIOL (BULK) 100 % POWDER: 100 % | Qty: 0.6

## 2022-01-08 MED FILL — HYDROXYZINE HCL 10 MG TABLET: 10 mg | ORAL | Qty: 1

## 2022-01-08 MED FILL — VITAMIN E (DL, ACETATE) 45 MG (100 UNIT) CAPSULE: 45 mg (100 unit) | ORAL | Qty: 8

## 2022-01-08 MED FILL — POTASSIUM CHLORIDE 40 MEQ/100ML IN STERILE WATER INTRAVENOUS PIGGYBACK: 40 mEq/100 mL | INTRAVENOUS | Qty: 100

## 2022-01-08 MED FILL — CHOLECALCIFEROL (VITAMIN D3) 25 MCG (1,000 UNIT) TABLET: 1000 UNITS | ORAL | Qty: 4

## 2022-01-08 NOTE — Interdisciplinary (Signed)
DISCHARGE PLAN HANDOFF NOTE      - Anticipated day and date of discharge: Saturday  - Time-sensitive tasks (with brief description): Yes - call transfer center early  - D/C plan: Other Acute Hospital: [place] (type, level of care, PCS form status):BLS  - CD needed? yes ordered? yes   - Orders complete?  no  - DC address if different from [place]  - Family contacts: Xan Sparkman [phone]   - Is the Patient and Family aware and agreeable with plan? Comments:  Needs to be called  - Weekend Service and Pager: First call  - Agency / Facility / DME (contact and phone numbers): [place]. [phone]                            F. [phone]  - Referral Method (Allscripts, EPIC):   - Primary Case Manager/Covering Case Manager: Pam Drown  - What needs to be done:    [ ]  Verify pt stable to dc  [ ]  Call Transfer center, they did not want anything faxed until they are ready to take pt, so will need to fax information (see above)  [ ]  Arrange BLS transport  [ ]  Call pt daughter (CM not able to reach her to let her know pt will transfer)  [ ]  send packet w/CD  [ ]  DC note

## 2022-01-08 NOTE — Consults (Signed)
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 The patient has received VRd X 4 (achieving VGPR) then Mel 200 ASCT 05/12/11. 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 She met the criteria for PD on 02/19/2020 and was started on third line DARA PD in June 2021, which was recently held on 10/20/21 due to chronic diarrhea. Patient was transferred OSH after recent hypovolemic/distributive shock. Here for acute encephalopathy workup and acute liver failure workup.  Assessment:      Recommendations    Recommended Discharge Disposition Placement for continued therapy       Discharge DME Recommendations Defer to next level of care   Equipment Recommendations     DME Comment     Discharge Recommendations Comment     Rehab Potential Patient participates well in therapy and is progressing towards goal   Anticipated Assistance Available at Discharge Family;Spouse/Significant other   Anticipated Type of Assistance Available at Discharge Assist as needed   Anticipated Time of Assistance Available at Discharge Unknown at this time   Barriers to Discharge Medical issues;Impaired Cognition;Insufficient activity tolerance;Behavioral issues;Insufficient physical ability   Patient's current functional ability appropriate for D/C recommendations Yes     Inpatient recommendations  OT Inpatient Recommendations Bed in chair position;Encourage participation in ADLs;Delirium precautions   Recommendation Comments Sling to chair and 2p assist   Current Maximal Level of Assist Needed       Prior Level of Function  Prior living environment comments:   Available equipment or existing home modifications:   Prior functional limitations:   Social supports:   Means to access community:   Occupation(s), roles and routines, exercise habits:   Fall history:   Fall history details:   Additional comments:           Currently in pain  Currently in pain: Yes  Pain location: reports pressure at neck with bed in chair position.  Pain scale: Wong-Baker  Pain description: moaning unable to describe and localize      Brace/Precautions   Brace/Orthotic/Prosthetic:   Precautions:Has weight bearing limitation or precaution: Yes  Precautions and weight bearing status comments: delrium, falls, skin     Subjective Report:"Why won't you comprimise with me?"    Patient/Family Goal:     Objective Findings and Interventions    Areas of Occupation    Grooming and Light Hygiene Supervision/safety;Set up   Cueing Required     Comment wash face   Self Feeding     Cueing Required     Comment     Toileting Moderate assistance   Cueing Required     Toileting Clothing Management Maximal assistance   Cueing Required     Comment mod A rolling to bed pan, max A pericare   Upper Body Dressing     Cueing Required     Comment     Lower Body Dressing     Cueing Required     Comment     Upper Body Bathing     Cueing Required     Comment     Lower Body Bathing     Cueing Required     Comment     ADL/IADL Comment         Functional Transfers    Bed Mobility From Supine   Bed Mobility To Sidelying   Level of Assist Moderate  assistance   Cueing Required     Transfer From Bed   Transfer To Chair   Level of Assist Dependent   Cueing Required     Technique     Functional Transfer Comment Started session in AM, pt requested to reschedule time due to fatigue. Pt agreeable to 2PM for dependent transfer to bed. Pt 2p hoyer tranfer to bed.         [place] for ADLs  6 Click Score: 11    Client Factors  Cognitive deficits noted in the following area(s):: Memory, Judgement/Safety, Arousal, Attention  Cognition comment:     Initiation;Organization;Sequencing;Problem ID;Problem solving;Information processing;Safety awareness  Executive functions comment:                                                     OT Education       OT Plan of Blanket, OT  01/08/2022 4:27 PM

## 2022-01-08 NOTE — Progress Notes (Signed)
[facility] PROGRESS NOTE  ATTENDING ONLY     My date of service is 01/08/22.    24 Hour Course  GI viral PCR came back positive for norovirus. ID consulted, ID said it is likely from colonization. ID stated that patient needs to be on enteric precaution but otherwise no plan for further treatment for norovirus. Patient is ok for transfer back to OSH      Subjective  Tearful after hearing that she has persistent norovirus. Stools are more formed. Denies abdominal pain    Vitals  Temp:  [36.6 C (97.9 F)-37.8 C (100 F)] 36.9 C (98.4 F)  Heart Rate:  [68-83] 71  *Resp:  [17-18] 18  BP: (102-127)/(65-78) 110/76  SpO2:  [95 %-99 %] 99 %    MostRecent Weight: 89.1 kg (196 lb 6.9 oz)  Admission Weight: 79.6 kg (175 lb 7.8 oz)      Intake/Output Summary (Last 24 hours) at 01/08/2022 1433  Last data filed at 01/08/2022 0900  Gross per 24 hour   Intake 2462 ml   Output 1210 ml   Net 1252 ml     Pain Score: 0    Physical Exam  Vitals reviewed.   Constitutional:       Appearance: She is ill-appearing.   HENT:      Head: Normocephalic and atraumatic.      Nose: Nose normal.      Mouth/Throat:      Mouth: Mucous membranes are moist.      Pharynx: Oropharynx is clear.   Eyes:      General: Scleral icterus present.      Extraocular Movements: Extraocular movements intact.      Pupils: Pupils are equal, round, and reactive to light.   Cardiovascular:      Rate and Rhythm: Normal rate and regular rhythm.   Pulmonary:      Effort: Pulmonary effort is normal. No respiratory distress.   Abdominal:      General: Abdomen is flat. There is no distension.      Palpations: Abdomen is soft.      Tenderness: There is no abdominal tenderness.   Musculoskeletal:      Cervical back: Normal range of motion and neck supple.      Right lower leg: No edema.      Left lower leg: No edema.   Skin:     General: Skin is warm and dry.      Coloration: Skin is jaundiced.   Neurological:      Mental Status: She is alert and oriented  to person, place, and time.   Psychiatric:         Mood and Affect: Mood normal.         Behavior: Behavior is slowed.     Scheduled Meds:   0.9% sodium chloride flush  3 mL Intravenous Q12H [place]    cholecalciferol (vitamin D3)  4,000 Units Oral Daily [place]    clotrimazole   Topical BID SCH    dicyclomine  20 mg Oral 4x Daily [place]    dry mouth oral rinse  5 mL Mucous Membrane 4x Daily [place]    melatonin  6 mg Oral Daily At Bedtime [facility]    multivitamin complete chewable  1 tablet Oral Daily [place]    QUEtiapine  50 mg Oral Once    rifAXIMin  550 mg Oral BID [place]    thiamine  200 mg Intravenous Q24H    ursodiol  600  mg Oral BID [facility]    valGANciclovir  450 mg Oral BID [place]    vitamin E (dl, acetate)  800 Units Oral Daily [place]     Continuous Infusions:   dextrose 5 % and 0.9 % sodium chloride 75 mL/hr (01/08/22 0329)     PRN Meds:   0.9% sodium chloride flush  3 mL Intravenous PRN    albuterol  2.5 mg Nebulization Q4H PRN    Or    albuterol  2.5 mg Nebulization Q4H PRN    HYDROmorphone  0.3-0.8 mg Intravenous Q4H PRN    hydrOXYzine  10 mg Oral Q8H PRN    loperamide  4 mg Oral 4x Daily PRN    magnesium sulfate in dextrose 5 %  2-4 g Intravenous Daily PRN    Or    magnesium sulfate in water  2-4 g Intravenous Daily PRN    ondansetron  8 mg Intravenous Q8H PRN    polyethylene glycol  17 g Oral Daily PRN    potassium chloride in sterile water  20-80 mEq Intravenous Daily PRN    Or    potassium chloride in sterile water  20-80 mEq Intravenous Daily PRN    Or    potassium chloride  20-80 mEq Oral Daily PRN     Data    CBC        01/08/22  0323 01/07/22  0309   WBC 4.1 3.7   HGB 9.5* 9.6*   HCT 28.3* 27.9*   PLT 61* 60*     Coags        01/08/22  0323 01/07/22  0309   INR 1.4* 1.4*     Chem7        01/08/22  0323 01/07/22  1600 01/07/22  0309   NA 141 140 139   K 3.3* 4.3 2.8*   CL 114* 114* 112*   CO2 24 22 23    BUN 2* 3* 3*   CREAT 0.34* 0.29* 0.31*   GLU 80 87 84     Electrolytes        01/08/22  0323  01/07/22  1600 01/07/22  0309   CA 7.5* 7.6* 7.3*   MG 1.8  --  2.0     Liver Panel        01/08/22  0323 01/07/22  0309   AST 77* 82*   ALT 64* 64*   ALKP 418* 409*   TBILI 11.6* 11.1*   TP 3.3* 3.2*   ALB 1.2* 1.2*     Lactate  No results found in last 36 hours    Microbiology Results (last 24 hours)     Procedure Component Value Units Date/Time    GI Viral Panel PCR [[id number]]  (Abnormal) Collected: 01/06/22 0935    Order Status: Completed Specimen: Not Applicable from Stool, Loose Updated: 01/07/22 2300     Adenovirus 40/41 Not detected     Norovirus GI/GII DETECTED     Rotavirus A Not detected     Comments Reference value for all analytes: Not Detected.        Radiology Results  No results found.     I discussed the patient with Carmin Muskrat, MD from [facility] regarding A&P.    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 The patient has received VRd X 4 (achieving VGPR) then Mel 200 ASCT 05/12/11. The patient had PD 03/2014 and was started on single-agent revelmid  as a second line therapy. She met the criteria for PD on 02/19/2020 and was started on third line DARA PD in June 2021, which was recently held on 10/20/21 due to chronic diarrhea. Patient was transferred OSH after recent hypovolemic/distributive shock. Here for acute encephalopathy workup and acute liver failure workup.      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   June 2020 due to progression of M spike Revlimid was increased to 15 mg PO D1-21 every 28 days, plus dexamethasone  July 2021: started daratumumab, Pomylast 2 mg D 1-21, Q28 days, dexamethasone 12 mg PO Q weekly.   Primary oncologist: Dr. Iona Coach,  Dr. Kennon Rounds [place] [facility]  Receiving dexamethasone 12 mg weekly for  diarrhea  Receiving MT with daratumumab with pomalyst and dex, most recent dose 10/16/21, has since been on hold due to worsening diarrhea.     DATA:   09/07/2010: IgG Kappa MM  12/02/2021: Ig M <5, IgG 447, IgA 20, Ig E <2, SFLC Kappa 16.6, Lambda 1.6, Kappa/Lamda 10.38  12/14/21: IgG K on IFE, IgG 430, KLC 16.6, LLC 2.7, KLR 6.15    Chemo:   Holding Daratumumab and Pomalyst, last dose 10/20/21, given recent infection and acute liver failure.    Supportive Care:   Transfusion: Keep Hg >7, and Platelets >10.   Per daughter, has autoantibodies in blood was a difficult match. Would like to be contacted prior to transfusion.   Line: PICC placed 1/26    Outpatient Oncologist: Dr. Kennon Rounds    Dispo: Given improvement in diarrhea and slowly improving Tbili (will take weeks to months per hepatology), plan to transfer back to prior OSH     # Immunocompromised  # CMV colitis  # At risk for infections due to malignancy  # Shock, likely hypovolemic vs distributive in the setting of chronic diarrhea, + norovirus infection   # Acute on chronic diarrhea, norovirus positive., diff and OP negative.   -No prior evidence of adrenal insufficiency, AM cortisol levels normal, previously on dexamethasone 8 mg PO Qweekly, but has been held since early December.    Data:  - CT brain: No acute intracranial hemorrhage, herniation, or hydrocephalus.  - CT chest/A/P pending: Medium-sized bilateral pleural effusions with bilateral peribronchovascular groundglass opacities, suggestive of pulmonary edema. Clustered nodules in right middle lobe along the right minor fissure, which could represent mild aspiration or infection. Numerous sclerotic and mixed lytic/sclerotic lesions throughout the thoracic spine and bilateral ribs, increased in size and number compared to prior. Appearance is atypical for multiple myeloma but may represent a combination of treated, partially treated, and active disease. No acute pathologic fracture. Findings  compatible with enterocolitis of infectious or inflammatory etiology. Involvement of the colon is greater than of the small bowel. Marked hepatic steatosis. Increased sclerosis of bone lesions throughout the thoracolumbar spine, bony pelvis, and proximal left femur. Appearance is atypical for multiple myeloma but may represent a combination of treated, partially treated, and active disease. No acute pathologic fracture.  - 1/29:CMV PCR [id number],672CNOB  - 1/31: GI PCR panel positive only for Norovirus.   2/1: LP results: Neutrophils 6% in tube 1, Lymphocytes 88% (predominant), protein 42 (normal), glucose 36 (low), rapid HSV in CSF negative. CMV CSF PCR negative    - 2/2 CMV  PCR 179,555High   - 2/2 Flex Sig Biopsies prelim: "colonic mucosa with a rare small non-necrotizing mucosal granuloma and rare CMV immunolabeling"   - 2/5 [place] work up: sIL2 (1556.6 high), ferritin (948 high), TG(258 high), NKA-IFN? 10 (WNL)  - 2/8: CMV PCR: 3107 (sig improvement)  - 2/10: C diff: negative.  - 2/12: Repeat GI viral PCR: positive for Norovirus  - 2/15 CMV 755  - 2/17 IgG 745  - 2/22: Repeat GI viral PCR positive for Norovirus    - 2/22 CMV 74    Plan:  - appreciate ID recs  -- Ganciclovir 5 mg/kg (1/31-2/21). S/p one dose of IVIG on 2/7.   -- S/p Valcyte 900 mg BID (2/21 -> 2/23) -> transitioned to maintenance Valcyte 450m BID   -- S/p Alinia (nitazoxanide) for 7 days (2/16 - 2/22) for norovirus treatment. Repeat GI viral PCR was persistently positive for Norovirus on 2/22, but ID said it is likely from colonization. No need to treat further  -- s/p IV Zosyn 4.5 g Q8hrs (1/26-1/28, restarted 1/29 - 2/1 for recurrent fever)  -- GI consult: appreciate recs: flex sig 2/2  -- Weekly CMV  -- Rifaximin for hepatic encephalopathy and SIBO as below     # Acute liver injury of unclear etiology  # Acute on chronic encephalopathy likely 2/2 infectious vs hepatic vs metabolic  # Hyperbilirubinemia  # Anasarca  # Hypoalbuminemia    Data:    -Steady rise in LFTs in 05/2021,  -12/02/21: AST 152--> 121, ALT 130--> 110, TB 5.4, Albumin 1.1  -Hepatitis panel negative.   -1/26:  Tbili 14.6, GGT 1160, ammonia 25  -CTAP 12/02/21: Scattered sclerotic lesions in the lumbar spine, hepatic steatosis.   -RUQ ultrasound: liver enlarged with diffuse increased echogenicity, consistent with fatty infiltration. 2cm stone in the gallbladder.   -HIDA scan: Normal radiotracer filling of the gallbladder is identified, therefore the exam is not consistent with cystic duct obstruction or acute cholecystitis.  - [place] 1/27: Hepatomegaly with diffusely increased echogenicity which can be seen with hepatic amyloidosis or steatosis. Please refer to the recently performed biopsy for further details. Mild perihepatic ascites. Patent hepatic vasculature with normal waveforms. Cholelithiasis without acute cholecystitis. Right pleural effusion.  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 CMV stains: Negative.  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 Could be drug related. Increase Ursodiol to 600 mg BID, Check Hepatitis E. 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 Unlikely diarrhea from Ursodiol.  - Avoiding steroids  - Daily LFT/INR  - RD consult placed to  expand diet to more than protein shakes.  -- Family requesting appetite stimulant but risk > benefit.     # Acute toxic encephalopathy, improving  In the setting of liver failure, ammonia only 25. LP performed 2/1 to r/o encephalitis.  Data:   1/31: HIV, TSH, Folate, B12 WNL  1/30: CT head WNL other than sinus disease  2/1: LP results: Neutrophils 6% in tube 1, Lymphocytes 88% (predominant), protein 42 (normal), glucose 36 (low), rapid HSV in CSF negative. CMV CSF PCR negative  2/5: Brain MRI: Smooth diffuse dural thickening and enhancement as well as bilateral holohemispheric subdural effusions, which may be on the basis of diminished intracranial pressure following recent lumbar puncture.  -2/8: EEG: non-specific mild cerebral dysfunction.  -2/9: CT Brain: similar to prior MRI with hyperdense areas likely hygromas    Plan:  - Neurology consult: 2/7: Encephalopathy likely from underlying medical disease. Delirium prevention. Avoid Ativan and try Seroquel for delirium. MRI findings is from LP. Okay to try caffiene tablets in the daytime to help with postural headache but patient is allergic to it.  -2/8: Repeat CT head to monitor the status of subdural effusion: stable, EEG: diffuse slowing, Thiamine iv prophylaxis.  -Speech path: Regular and thin liquid. Slightly higher risk for aspiration given encephalopathy.  - Start empiric rifaximin given possibility of HE without elevated ammonia (2/14 ->)  -- Rifaximin dose adjusted to cover for SIBO, then de-escalated back to HE coverage on 2/22   -- holding off on lactulose given diarrhea    # Deconditioning  Pt has not been engaging much with PT/OT despite improved mental status. May be due in part to anxieties about diarrhea/incontinence.  - continue PT/OT  - continue encouragement to participate, particularly by family/familiar faces    # Hypokalemia:  # Hypomagnesemia:  Replete as needed.    # Vitamin D deficiency:  -Supplements ordered.    # Moderate  malnutrition:  -Seen by RD and given recs for supplements.    # History of PE, diagnosed in April 2019  - hold home [place] for now given recent anemia and concerns for bleeding.   - monitor for signs and symptoms of worsening bleeding.     # Elevated TSH at OSH, likely 2/2 subclinical hypothyroidism  -TSH WNL on 12/15/20.      # Anxiety, emotional distress:   -Patient has stressful social situation, intellectually disabled son, and regarding underlying disease process.     # Chronic buttocks pressure wound  - Wound care consult, appreciate recs  - Position changes as tolerated, waffle cushion, etc  -- Pressure wound ruled out, treating bilateral buttocks moisture and friction damage   - Rectal tube placed 2/18 to avoid contamination of wounds by stool    Wound care:  - Bilateral Groin/Thighs   Gently cleanse with warm water & Remedy no-rinse foam (pH-balanced cleanser) with soft cloths, pat dry ensuring   thoroughly dried.   Do not use CHG to rash areas.   Apply Clotrimazole ointment to rash BID, ensure thoroughly rubbed in.   Apply a piece of Interdry textile [phone]) leaving 2 inches exposed from fold to facilitate wicking.     - Bilateral Buttocks   First rinse with perineal irrigation bottle & warm water to remove as much stool as possible to minimize wiping.   Cleanse wounds & surrounding skin with Anasept wound cleanser spray ([id number]#[id number]), pat dry with sterile gauze.   Apply a thick layer of Triad hydrophilic wound dressing paste (OMB#[id number]) over wounds & periwound areas.   Promptly clean patient after episodes of incontinence using method described.   Remove only the top, soiled layer of Triad during incontinence cleaning to avoid further eroding the skin & apply another top   layer of  Triad following cleaning.   Apply Triad at least BID & PRN to keep area protected.     - Pressure Injury Prevention   Continue q2h repositioning- 30 degree lateral turn to L & R only unless medically contraindicated to  avoid direct pressure on   sacrum.   Place Waffle mattress overlay [id number]) under patient in bed to assist with pressure redistribution.   Use waffle cushion while OOB to chair, reposition frequently while in chair every 30 minutes.   Additionally, float heels completely off of bed. If pillows do not suffice, order heel offloading boots (PMM# [id number]).      # VTE PPx:  Contraindicated: Other: anticipate Plt <50    Severity of Illness  Stable     During this hospitalization the patient is also being treated for:  - Sepsis (present on admission)    - Malignancy associated fatigue on admission  - Neoplasm/malignancy associated pain  - Hypoalbuminemia (present on admission)  - Fluid status: Hypovolemia on admission     - Cachexia  - Encephalopathy: Metabolic encephalopathy   - Pancytopenia 2/2 chemotherapy    Code Status: [place], MD  01/08/22

## 2022-01-08 NOTE — Interdisciplinary (Signed)
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 The patient has received VRd X 4 (achieving VGPR) then Mel 200 ASCT 05/12/11. 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 She met the criteria for PD on 02/19/2020 and was started on third line DARA PD in June 2021, which was recently held on 10/20/21 due to chronic diarrhea. Patient was transferred OSH after recent hypovolemic/distributive shock. Here for acute encephalopathy workup and acute liver failure workup.       Functional Status  Mobility/Safety  Activity: In bed  Level of Assistance: Moderate assist  Number of Person to Assist: 2 persons  Assistive Device (nursing): Ceiling lift  Rehab Assistive Device Recommendation: Vertical dependent lift, Ceiling lift    Financial Status  Payor:  AET[employer]  Second Payor:      EDD      Proposed Discharge Plan  Discharge to Facility: SNF  Facility Discharge Process: Patient/Surrogate decision-maker understands/agrees with discharge plan    Additional Assessment/Plan Details  CM alerted at [facility] pt ready to transfer back to sending hospital.  CM sw Mark @ [facility]: [phone] but he said they did not have beds today, and to call tomorrow to see if bed available.     Pam Drown  MSN, RN, CM  [facility]  Hematology, Oncology    01/08/2022

## 2022-01-08 NOTE — Plan of Care (Signed)
Problem: Fall, at Risk or Actual - Adult  Goal: Absence of falls and fall related injury  Outcome: Progress within 12 hours     Problem: Delirium - Adult / Pediatric  Goal: Absence or resolution of delirium  Outcome: Progress within 12 hours     Problem: Activity Intolerance / Fatigue - Hematological / Immunological / Oncological Condition - Adult  Goal: Able to perform physical activity as ordered  Outcome: Progress within 12 hours

## 2022-01-08 NOTE — Progress Notes (Signed)
INFECTIOUS DISEASES FOLLOW UP CONSULT NOTE  ATTENDING ONLY     My date of service is 01/08/2022.    Interim History since previous Infectious Disease note  Reconsulted for positive Norovirus infection.  Last seen by ID on 12/31/21, since then  - Persistent diarrhea  - 2/19-2/21,  rectal tube placed  - Took Nitazoxanide (2/19-2/22, 5 days)- no change in diarrhea  - Gan IV changed to Valgan PO (2/21)  - 2/22 CMV PCR 74 copies     Subjective  Afebrile, had 3 BM, no abd pain. No N/V    Medications  Scheduled Meds:   0.9% sodium chloride flush  3 mL Intravenous Q12H [place]    cholecalciferol (vitamin D3)  4,000 Units Oral Daily [place]    clotrimazole   Topical BID [place]    dicyclomine  20 mg Oral 4x Daily [place]    dry mouth oral rinse  5 mL Mucous Membrane 4x Daily [place]    melatonin  6 mg Oral Daily At Bedtime [facility]    multivitamin complete chewable  1 tablet Oral Daily [place]    QUEtiapine  50 mg Oral Once    rifAXIMin  550 mg Oral BID [place]    thiamine  200 mg Intravenous Q24H    ursodiol  600 mg Oral BID [facility]    valGANciclovir  450 mg Oral BID [place]    vitamin E (dl, acetate)  800 Units Oral Daily [place]     Continuous Infusions:   dextrose 5 % and 0.9 % sodium chloride 75 mL/hr (01/08/22 0329)       Antibiotic History    Valgan  01/06/22-     Nitazoxanide  2/19-2/22,  Ganciclovir  1/31-2/21/23  Pip-tazo  1/18-1/22, 1/24-1/27, 1/29-1/31  Micafungin  1/31-2/2  CTX 1/19  Vanc 1/18-1/19    High Medication Toxicity Risk: YES    Vitals  Temp:  [36.6 C (97.9 F)-37.8 C (100 F)] 36.9 C (98.4 F)  Heart Rate:  [68-83] 71  *Resp:  [17-18] 18  BP: (101-127)/(65-78) 110/76  SpO2:  [95 %-99 %] 99 %      Intake/Output Summary (Last 24 hours) at 01/08/2022 0955  Last data filed at 01/08/2022 0900  Gross per 24 hour   Intake 2662 ml   Output 1960 ml   Net 702 ml       Physical Examination    Constitutional: No distress.  HENT: NCAT.  Oropharynx is clear and moist and mucous membranes are normal.   Eyes: Conjunctivae and EOM are normal.  PERRL.  +Jaundice   Neck: Neck supple.  Cardiovascular: Normal rate, regular rhythm, S1/S2 normal.  No murmurs or friction rub.  Pulmonary/Chest: No respiratory distress. CTAB.  No wheezes or rhonchi.   GI: Soft. +BS.  No distension, tenderness, or rebound. No organomegaly.  Extremities: No clubbing, cyanosis, or + edema.  Neurological: A&Ox3.  Skin: No rashes, lesions, or peripheral stigmata of endocarditis.   Psychiatric: Normal mood and affect.     Lines/tubes:   Urinary catheter (12/19/21)  PICC LUE (1/26)    Data    CBC        01/08/22  0323 01/07/22  0309 01/06/22  0300   WBC 4.1 3.7 2.8*   HGB 9.5* 9.6* 6.8*   HCT 28.3* 27.9* 21.1*   PLT 61* 60* 62*   NEUTA 3.60 3.28 2.50   LYMA 0.26* 0.23* 0.19*   MOA 0.16* 0.10* 0.10*   EOA 0.01 0.01 0.01   BASOA 0.01 0.01  0.00       Chem7        01/08/22  0323 01/07/22  1600 01/07/22  0309   NA 141 140 139   K 3.3* 4.3 2.8*   CL 114* 114* 112*   CO2 24 22 23    BUN 2* 3* 3*   CREAT 0.34* 0.29* 0.31*   GLU 80 87 84       Liver Panel        01/08/22  0323 01/07/22  0309 01/06/22  0300 12/11/21  0328 12/10/21  0208   AST 77* 82* 85*   < > 101*   ALT 64* 64* 66*   < > 116*   ALKP 418* 409* 413*   < > 745*   TBILI 11.6* 11.1* 10.1*   < > 14.6*   TP 3.3* 3.2* 3.1*   < > 3.2*   ALB 1.2* 1.2* 1.1*   < > 2.0*   GGT  --   --   --   --  1,160*    < > = values in this interval not displayed.       CRP        12/13/21  0302   CRP 11.3*     Microbiology    2/22 Stool PCR + Norovirus  2/22 CMV PCR 74  2/15 CMV PCR 755  2/12  Stool PCR + Norovirus  2/10  C diff neg  2/8  CMV PCR 3,107  2/1  CSF   0 WBCs, normal protein, glucose low at 36.  HSV PCR neg  CMV PCR neg  2/1  C diff neg  2/1  GI PCR + norovirus  2/1  CMV PCR 179,555   1/30  UCx >100K C albicans  1/29  UCx >100K C albicans  1/29  cBCx x2 NG  1/29  CMV PCR 979,480  1/29  Hep E IgM neg  1/29  HBeAb neg  1/28  VZV PCR neg  1/28  HSV PCR neg  1/27  HAV IgG positive  1/27  EBV PCR neg  1/27  HBsAg neg, sAb 12  1/26  pBCx x2 NG  1/26  MRSA swab  neg  1/26  C19 PCR neg  1/26  UCx >100K C [place]  1/18  GI PCR: +norovirus  1/18:  flu, RSV, covid neg  1/22:  CMV PCR positive  1/22:  EBV PCR neg  1/24:  blood cx x2: neg  1/25:  VZV PCR: neg    Pathology  12/25/21 Colon Bx  - CMV +    1/27  Liver x  - Steatohepatitis, CMV neg    Radiology Results  2/15 US liver  - GB stone, no biliary duct dilatation, HM with steatosis, small ascites    1/30  CT abd  - Diffuse bowel wall edema.    I have personally reviewed and interpreted the above studies:    Assessment and Recommendations    SYMPHONY DEMURO is a 61 y.o. 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 Currently has about 3 BM. Took Nitazoxinde without obvious improvement. I would continue adequate hydration, hand washing. No obvious treatment other than decreasing immunosuppression (pt not on IS).  If transferred to other facility, need to be on enteric contact isolation.    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 Colon bx with CMV +, liver bx neg for CMV, CSF CMV PCR neg. Give 2 more week of oral valganciclovir.    Recommendations  - continue enteric contact isolation  - adequate hydration   - give 2 more week of valganciclovir PO    D/w Dr. Hampton Abbot    We will sign off.  Please page the Consult [facility] 1st Call with questions.    Marina Gravel, MD     I spent a total time of 50 minutes in this episode of care preparing to see the patient, obtaining and/or reviewing separately obtained history, performing a medically appropriate examination and/or evaluation, counseling and educating patient/family/caregiver, ordering and reviewing medications, tests, or procedures, referring and  communicating with other health care professionals, documenting clinical information in the electronic or other health record, and communicating results to the patient/family/caregiver, and/or other care coordination.

## 2022-01-09 LAB — BASIC METABOLIC PANEL (NA, K, CL, CO2, BUN, CR, GLU, CA)
Anion Gap: 4 (ref 4–14)
Anion Gap: 4 (ref 4–14)
Calcium, total, Serum / Plasma: 7.4 mg/dL — ABNORMAL LOW (ref 8.4–10.5)
Calcium, total, Serum / Plasma: 7.4 mg/dL — ABNORMAL LOW (ref 8.4–10.5)
Carbon Dioxide, Total: 24 mmol/L (ref 22–29)
Carbon Dioxide, Total: 23 mmol/L (ref 22–29)
Chloride, Serum / Plasma: 113 mmol/L — ABNORMAL HIGH (ref 101–110)
Chloride, Serum / Plasma: 113 mmol/L — ABNORMAL HIGH (ref 101–110)
Creatinine: 0.31 mg/dL — ABNORMAL LOW (ref 0.55–1.02)
Creatinine: 0.34 mg/dL — ABNORMAL LOW (ref 0.55–1.02)
Glucose, non-fasting: 87 mg/dL (ref 70–199)
Glucose, non-fasting: 88 mg/dL (ref 70–199)
Potassium, Serum / Plasma: 3.3 mmol/L — ABNORMAL LOW (ref 3.5–5.0)
Potassium, Serum / Plasma: 3.2 mmol/L — ABNORMAL LOW (ref 3.5–5.0)
Sodium, Serum / Plasma: 141 mmol/L (ref 135–145)
Sodium, Serum / Plasma: 140 mmol/L (ref 135–145)
Urea Nitrogen, Serum / Plasma: 2 mg/dL — ABNORMAL LOW (ref 7–25)
Urea Nitrogen, Serum / Plasma: 2 mg/dL — ABNORMAL LOW (ref 7–25)
eGFRcr: 120 mL/min/{1.73_m2} (ref >59)
eGFRcr: 117 mL/min/{1.73_m2} (ref >59)

## 2022-01-09 LAB — COMPREHENSIVE METABOLIC PANEL (BMP, AST, ALT, T.BILI, ALKP, TP ALB)
AST: 72 U/L — ABNORMAL HIGH (ref 5–44)
Alanine transaminase: 61 U/L (ref 10–61)
Albumin, Serum / Plasma: 1.1 g/dL — ABNORMAL LOW (ref 3.4–4.8)
Alkaline Phosphatase: 362 U/L — ABNORMAL HIGH (ref 38–108)
Anion Gap: <1 — ABNORMAL LOW (ref 4–14)
Bilirubin, Total: 10.7 mg/dL — ABNORMAL HIGH (ref 0.2–1.2)
Calcium, total, Serum / Plasma: 6.9 mg/dL — ABNORMAL LOW (ref 8.4–10.5)
Carbon Dioxide, Total: 21 mmol/L — ABNORMAL LOW (ref 22–29)
Chloride, Serum / Plasma: 126 mmol/L — ABNORMAL HIGH (ref 101–110)
Creatinine: 0.32 mg/dL — ABNORMAL LOW (ref 0.55–1.02)
Glucose, non-fasting: 87 mg/dL (ref 70–199)
Potassium, Serum / Plasma: >10 mmol/L — ABNORMAL HIGH (ref 3.5–5.0)
Protein, Total, Serum / Plasma: 3 g/dL — ABNORMAL LOW (ref 6.3–8.6)
Sodium, Serum / Plasma: 131 mmol/L — ABNORMAL LOW (ref 135–145)
Urea Nitrogen, Serum / Plasma: 2 mg/dL — ABNORMAL LOW (ref 7–25)
eGFRcr: 119 mL/min/{1.73_m2} (ref >59)

## 2022-01-09 LAB — COMPLETE BLOOD COUNT WITH DIFFERENTIAL
Abs Basophils: 0.01 10*9/L (ref 0.00–0.10)
Abs Eosinophils: 0.01 10*9/L (ref 0.00–0.40)
Abs Imm Granulocytes: 0.05 10*9/L (ref <0.10)
Abs Lymphocytes: 0.2 10*9/L — ABNORMAL LOW (ref 1.00–3.40)
Abs Monocytes: 0.22 10*9/L (ref 0.20–0.80)
Abs Neutrophils: 3.1 10*9/L (ref 1.80–6.80)
Hematocrit: 27.7 % — ABNORMAL LOW (ref 36.0–46.0)
Hemoglobin: 9.1 g/dL — ABNORMAL LOW (ref 12.0–15.5)
MCH: 34.3 pg — ABNORMAL HIGH (ref 26.0–34.0)
MCHC: 32.9 g/dL (ref 31.0–36.0)
MCV: 105 fL — ABNORMAL HIGH (ref 80–100)
MPV: 12.2 fL (ref 9.1–12.6)
Platelet Count: 57 10*9/L — ABNORMAL LOW (ref 140–450)
RBC Count: 2.65 10*12/L — ABNORMAL LOW (ref 4.00–5.20)
RDW-CV: UNDETERMINED % (ref 11.7–14.4)
WBC Count: 3.6 10*9/L (ref 3.4–10.0)

## 2022-01-09 LAB — FIBRINOGEN, FUNCTIONAL: Fibrinogen, Functional: 155 mg/dL — ABNORMAL LOW (ref 202–430)

## 2022-01-09 LAB — BILIRUBIN, DIRECT: Bilirubin, Direct: 7.5 mg/dL — ABNORMAL HIGH (ref <0.6)

## 2022-01-09 LAB — PROTHROMBIN TIME
INR: 1.4 — ABNORMAL HIGH (ref 0.9–1.2)
PT: 16.6 s — ABNORMAL HIGH (ref 11.6–15.0)

## 2022-01-09 LAB — MAGNESIUM, SERUM / PLASMA: Magnesium, Serum / Plasma: 1.8 mg/dL (ref 1.6–2.6)

## 2022-01-09 MED ORDER — LOPERAMIDE 2 MG TABLET
2 | Freq: Four times a day (QID) | ORAL | 0.00 refills | 30.00000 days | Status: DC | PRN
Start: 2022-01-09 — End: 2022-05-19

## 2022-01-09 MED ORDER — THIAMINE HCL (VITAMIN B1) 100 MG/ML INJECTION SOLUTION
100 mg/mL | INTRAMUSCULAR | Status: AC
Start: 2022-01-09 — End: 2022-04-20

## 2022-01-09 MED ORDER — POTASSIUM CHLORIDE 20 MEQ/50 ML IN STERILE WATER INTRAVENOUS PIGGYBACK
20 mEq/50 mL | INTRAVENOUS | Status: AC
  Administered 2022-01-10: 08:00:00 20 meq via INTRAVENOUS

## 2022-01-09 MED ORDER — VITAMIN E (DL, ACETATE) 45 MG (100 UNIT) CAPSULE
45100 mg (100 unit) | Freq: Every day | ORAL | Status: DC
Start: 2022-01-09 — End: 2022-04-20

## 2022-01-09 MED ORDER — HYDROMORPHONE (PF) 0.5 MG/0.5 ML INJECTION SYRINGE
0.5 | INTRAMUSCULAR | 0 refills | Status: DC | PRN
Start: 2022-01-09 — End: 2022-04-20

## 2022-01-09 MED ORDER — DICYCLOMINE 10 MG CAPSULE
10 mg | Freq: Four times a day (QID) | ORAL | Status: DC
Start: 2022-01-09 — End: 2022-04-20

## 2022-01-09 MED ORDER — RIFAXIMIN 550 MG TABLET
550 mg | Freq: Two times a day (BID) | ORAL | Status: AC
Start: 2022-01-09 — End: 2022-05-19

## 2022-01-09 MED ORDER — POTASSIUM CHLORIDE 20 MEQ/50 ML IN STERILE WATER INTRAVENOUS PIGGYBACK
20 mEq/50 mL | INTRAVENOUS | Status: AC
  Administered 2022-01-10: 10:00:00 20 meq via INTRAVENOUS

## 2022-01-09 MED ORDER — CLOTRIMAZOLE 1 % TOPICAL CREAM
1 | Freq: Two times a day (BID) | TOPICAL | 1.00 refills | 17.50000 days | Status: AC
Start: 2022-01-09 — End: 2022-04-20

## 2022-01-09 MED ORDER — MELATONIN 3 MG TABLET
3 | Freq: Every day | ORAL | Status: DC
Start: 2022-01-09 — End: 2022-04-20

## 2022-01-09 MED ORDER — HYDROXYZINE HCL 10 MG TABLET
10 | Freq: Three times a day (TID) | ORAL | 0.00 refills | 30.00000 days | Status: DC | PRN
Start: 2022-01-09 — End: 2022-04-20

## 2022-01-09 MED ORDER — PEDIATRIC MULTIVITAMIN WITH IRON AND OTHER MINERALS CHEWABLE TABLET
Freq: Every day | ORAL | Status: DC
Start: 2022-01-09 — End: 2022-04-20

## 2022-01-09 MED ORDER — ALBUTEROL SULFATE 2.5 MG/3 ML (0.083 %) SOLUTION FOR NEBULIZATION
2.5 | RESPIRATORY_TRACT | 2.00 refills | 25.00000 days | Status: DC | PRN
Start: 2022-01-09 — End: 2022-04-20

## 2022-01-09 MED ORDER — ONDANSETRON HCL (PF) 4 MG/2 ML INJECTION SOLUTION
4 | Freq: Three times a day (TID) | INTRAMUSCULAR | Status: DC | PRN
Start: 2022-01-09 — End: 2022-04-20

## 2022-01-09 MED ORDER — SALIVA SUBSTITUTE COMBO NO.9 MOUTHWASH
Freq: Four times a day (QID) | 1.00 refills | Status: DC
Start: 2022-01-09 — End: 2022-04-20

## 2022-01-09 MED ORDER — SODIUM CHLORIDE 0.9 % (FLUSH) INJECTION SYRINGE
0.9 | Freq: Two times a day (BID) | INTRAMUSCULAR | 3.00 refills | 30.00000 days | Status: AC | PRN
Start: 2022-01-09 — End: 2022-04-20

## 2022-01-09 MED ORDER — URSODIOL 60 MG/ML ORAL SYRUP
60 | Freq: Two times a day (BID) | ORAL | Status: DC
Start: 2022-01-09 — End: 2022-05-19

## 2022-01-09 MED ORDER — DEXTROSE 5 % AND 0.9 % SODIUM CHLORIDE INTRAVENOUS SOLUTION
INTRAVENOUS | Status: DC
Start: 2022-01-09 — End: 2022-04-20

## 2022-01-09 MED ORDER — VALGANCICLOVIR 450 MG TABLET
450 | Freq: Two times a day (BID) | ORAL | 11.00 refills | 30.00000 days | Status: DC
Start: 2022-01-09 — End: 2022-04-20

## 2022-01-09 MED ORDER — CHOLECALCIFEROL (VITAMIN D3) 25 MCG (1,000 UNIT) TABLET
1000 | Freq: Every day | ORAL | Status: DC
Start: 2022-01-09 — End: 2022-04-20

## 2022-01-09 MED FILL — CHOLECALCIFEROL (VITAMIN D3) 25 MCG (1,000 UNIT) TABLET: 1000 UNITS | ORAL | Qty: 4

## 2022-01-09 MED FILL — THIAMINE HCL (VITAMIN B1) 100 MG/ML INJECTION SOLUTION: 100 mg/mL | INTRAMUSCULAR | Qty: 2

## 2022-01-09 MED FILL — POTASSIUM CHLORIDE 20 MEQ/50 ML IN STERILE WATER INTRAVENOUS PIGGYBACK: 20 mEq/50 mL | INTRAVENOUS | Qty: 50

## 2022-01-09 MED FILL — POTASSIUM CHLORIDE 40 MEQ/100ML IN STERILE WATER INTRAVENOUS PIGGYBACK: 40 mEq/100 mL | INTRAVENOUS | Qty: 100

## 2022-01-09 MED FILL — VALGANCICLOVIR 450 MG TABLET: 450 mg | ORAL | Qty: 1

## 2022-01-09 MED FILL — XIFAXAN 550 MG TABLET: 550 mg | ORAL | Qty: 1

## 2022-01-09 MED FILL — URSODIOL (BULK) 100 % POWDER: 100 % | Qty: 0.6

## 2022-01-09 MED FILL — MELATONIN 1 MG TABLET: 1 mg | ORAL | Qty: 1

## 2022-01-09 MED FILL — ANTI-DIARRHEAL (LOPERAMIDE) 2 MG TABLET: 2 mg | ORAL | Qty: 2

## 2022-01-09 MED FILL — VITAMIN E (DL, ACETATE) 45 MG (100 UNIT) CAPSULE: 45 mg (100 unit) | ORAL | Qty: 8

## 2022-01-09 MED FILL — DICYCLOMINE 10 MG CAPSULE: 10 mg | ORAL | Qty: 2

## 2022-01-09 MED FILL — FLINTSTONES COMPLETE (IRON) CHEWABLE TABLET: ORAL | Qty: 1

## 2022-01-09 MED FILL — DILAUDID (PF) 0.5 MG/0.5 ML INJECTION SYRINGE: 0.5 mg/ mL | INTRAMUSCULAR | Qty: 0.5

## 2022-01-09 NOTE — Discharge Instructions (Signed)
Call clinic immediately for the following: ([phone])  Fever greater than 38o C or 100.4o F and/or shaking chills   Shortness of breath   Severe cough   Bleeding that does not stop on its own  Nausea/vomiting that does not get better with nausea medications  Inability to take oral medications  3 or more loose or liquid stools per day  If you have fallen *(see below)  Concerns about your central line    Do NOT use My Chart to communicate any of these symptoms.    ALWAYS CALL 911 for emergencies, such as:  New or increase in chest pain  New or increase in shortness of breath  Changes in mental status, speech, ability to walk  *If you have fallen and hit your head, are bleeding, unable to walk or move extremities, or are in severe pain.    Do NOT use My Chart to communicate any of these symptoms.    General tips:   A balance of activities is important. Be sure to gradually increase your exercise, but avoid overexertion & exhaustion.  Hand-washing is important. Be sure that you and the others you live with wash their hands frequently and always before preparing food or after using the bathroom.  You can resume a regular diet unless instructed otherwise by your doctor or nurse practitioner.  Please arrive 1 hour before your scheduled MD, NP or PIC ([facility]) appointment to have your labs drawn, unless you are told otherwise. This allows time for labs results to come in prior to your appointment.   Prescription refills can take time to process. Please request refills before discharge from the hospital, or from the clinic at least 1 week before you run out of your medication.  You should have your follow up visit scheduled within 24 hours (1 business day) of being discharged from the hospital. If you do not hear from our discharge coordinator within this time, please call our main line ([phone]), press #1, then #3 for an MD or NP appointment or #4 for a PIC (infusion) appointment. You can ask to  speak with the discharge coordinator when you reach a live person.

## 2022-01-09 NOTE — Interdisciplinary (Signed)
1000: CM called [facility]. [phone]. [phone]. Spoke to [facility]. No beds. Next update is at 1500. Their ED is full as well.  BS RN updated.    1600: spoke to Judson Roch from [facility]. No beds. No beds this evening CM to call in the morning. Team aware and BS rn.    MD confirms medsurg LOC needed.    Toneisha Savary, RN   Nurse Case Manager   Vascular/ Cardiology C&D   Thursday- Saturday only  Office: [place]

## 2022-01-09 NOTE — Progress Notes (Signed)
[facility] PROGRESS NOTE  ATTENDING ONLY     My date of service is 01/09/22.    24 Hour Course  NAEON     Subjective  Sleepy today but otherwise no specific complaints. Still feels very weak.    Vitals  Temp:  [36.6 C (97.9 F)-37.2 C (99 F)] 36.6 C (97.9 F)  Heart Rate:  [69-76] 71  *Resp:  [18] 18  BP: (104-124)/(64-81) 124/79  SpO2:  [96 %-97 %] 96 %    MostRecent Weight: 90.1 kg (198 lb 10.2 oz)  Admission Weight: 79.6 kg (175 lb 7.8 oz)      Intake/Output Summary (Last 24 hours) at 01/09/2022 1143  Last data filed at 01/09/2022 0411  Gross per 24 hour   Intake 2330 ml   Output 1900 ml   Net 430 ml     Pain Score: 0    Physical Exam  Vitals reviewed.   Constitutional:       Appearance: She is ill-appearing.   HENT:      Head: Normocephalic and atraumatic.      Nose: Nose normal.      Mouth/Throat:      Mouth: Mucous membranes are moist.      Pharynx: Oropharynx is clear.   Eyes:      General: Scleral icterus present.      Extraocular Movements: Extraocular movements intact.      Pupils: Pupils are equal, round, and reactive to light.   Cardiovascular:      Rate and Rhythm: Normal rate and regular rhythm.   Pulmonary:      Effort: Pulmonary effort is normal. No respiratory distress.   Abdominal:      General: Abdomen is flat. There is no distension.      Palpations: Abdomen is soft.      Tenderness: There is no abdominal tenderness.   Musculoskeletal:      Cervical back: Normal range of motion and neck supple.      Right lower leg: No edema.      Left lower leg: No edema.   Skin:     General: Skin is warm and dry.      Coloration: Skin is jaundiced.   Neurological:      Mental Status: She is alert and oriented to person, place, and time.   Psychiatric:         Mood and Affect: Mood normal.         Behavior: Behavior is cooperative.     Scheduled Meds:   0.9% sodium chloride flush  3 mL Intravenous Q12H [place]    cholecalciferol (vitamin D3)  4,000 Units Oral Daily [place]    clotrimazole    Topical BID SCH    dicyclomine  20 mg Oral 4x Daily [place]    dry mouth oral rinse  5 mL Mucous Membrane 4x Daily [place]    melatonin  6 mg Oral Daily At Bedtime [facility]    multivitamin complete chewable  1 tablet Oral Daily [place]    QUEtiapine  50 mg Oral Once    rifAXIMin  550 mg Oral BID [place]    thiamine  200 mg Intravenous Q24H    ursodiol  600 mg Oral BID [facility]    valGANciclovir  450 mg Oral BID [place]    vitamin E (dl, acetate)  800 Units Oral Daily [place]     Continuous Infusions:   dextrose 5 % and 0.9 % sodium chloride 75 mL/hr (01/09/22 0447)  PRN Meds:   0.9% sodium chloride flush  3 mL Intravenous PRN    albuterol  2.5 mg Nebulization Q4H PRN    Or    albuterol  2.5 mg Nebulization Q4H PRN    HYDROmorphone  0.3-0.8 mg Intravenous Q4H PRN    hydrOXYzine  10 mg Oral Q8H PRN    loperamide  4 mg Oral 4x Daily PRN    magnesium sulfate in dextrose 5 %  2-4 g Intravenous Daily PRN    Or    magnesium sulfate in water  2-4 g Intravenous Daily PRN    ondansetron  8 mg Intravenous Q8H PRN    polyethylene glycol  17 g Oral Daily PRN    potassium chloride in sterile water  20-80 mEq Intravenous Daily PRN    Or    potassium chloride in sterile water  20-80 mEq Intravenous Daily PRN    Or    potassium chloride  20-80 mEq Oral Daily PRN     Data    CBC        01/09/22  0417 01/08/22  0323   WBC 3.6 4.1   HGB 9.1* 9.5*   HCT 27.7* 28.3*   PLT 57* 61*     Coags        01/09/22  0417 01/08/22  0323   INR 1.4* 1.4*     Chem7        01/09/22  0651 01/09/22  0417 01/08/22  1557 01/08/22  0323   NA 140 131* 141 141   K 3.2* >10.0* 3.3* 3.3*   CL 113* 126* 113* 114*   CO2 23 21* 24 24   BUN 2* 2* 2* 2*   CREAT 0.34* 0.32* 0.31* 0.34*   GLU 88 87 87 80     Electrolytes        01/09/22  0651 01/09/22  0417 01/08/22  1557 01/08/22  0323   CA 7.4* 6.9* 7.4* 7.5*   MG  --  1.8  --  1.8     Liver Panel        01/09/22  0417 01/08/22  0323   AST 72* 77*   ALT 61 64*   ALKP 362* 418*   TBILI 10.7* 11.6*   TP 3.0* 3.3*   ALB 1.1*  1.2*     Lactate  No results found in last 36 hours    Microbiology Results (last 24 hours)     ** No results found for the last 24 hours. **        Radiology Results  No results found.     I discussed the patient with Carmin Muskrat, MD from [facility] regarding A&P.    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 The patient has received VRd X 4 (achieving VGPR) then Mel 200 ASCT 05/12/11. 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 She met the criteria for PD on 02/19/2020 and was started on third line DARA PD in June 2021, which was recently held on 10/20/21 due to chronic diarrhea. Patient was transferred OSH after recent hypovolemic/distributive shock. Here for acute encephalopathy workup and acute liver failure workup.      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   June 2020 due to progression of M spike Revlimid was increased to 15 mg PO D1-21 every 28 days, plus dexamethasone  July 2021: started daratumumab, Pomylast 2 mg D 1-21, Q28 days, dexamethasone 12 mg PO Q weekly.   Primary oncologist: Dr. Iona Coach,  Dr. Kennon Rounds [place] [facility]  Receiving dexamethasone 12 mg weekly for diarrhea  Receiving MT with daratumumab with pomalyst and dex, most recent dose 10/16/21, has since been on hold due to worsening diarrhea.     DATA:   09/07/2010: IgG Kappa MM  12/02/2021: Ig M <5, IgG 447, IgA 20, Ig E <2, SFLC Kappa 16.6, Lambda 1.6, Kappa/Lamda 10.38  12/14/21: IgG K on IFE, IgG 430, KLC 16.6, LLC 2.7, KLR 6.15    Chemo:   Holding Daratumumab and Pomalyst, last dose 10/20/21, given recent infection and acute liver failure.    Supportive Care:   Transfusion: Keep Hg >7, and Platelets >10.   Per daughter, has  autoantibodies in blood was a difficult match. Would like to be contacted prior to transfusion.   Line: PICC placed 1/26    Outpatient Oncologist: Dr. Kennon Rounds    Dispo: Given improvement in diarrhea and slowly improving Tbili (will take weeks to months per hepatology), plan to transfer back to prior OSH once bed available    # Immunocompromised  # CMV colitis  # At risk for infections due to malignancy  # Shock, likely hypovolemic vs distributive in the setting of chronic diarrhea, + norovirus infection   # Acute on chronic diarrhea, norovirus positive., diff and OP negative.   -No prior evidence of adrenal insufficiency, AM cortisol levels normal, previously on dexamethasone 8 mg PO Qweekly, but has been held since early December.    Data:  - CT brain: No acute intracranial hemorrhage, herniation, or hydrocephalus.  - CT chest/A/P pending: Medium-sized bilateral pleural effusions with bilateral peribronchovascular groundglass opacities, suggestive of pulmonary edema. Clustered nodules in right middle lobe along the right minor fissure, which could represent mild aspiration or infection. Numerous sclerotic and mixed lytic/sclerotic lesions throughout the thoracic spine and bilateral ribs, increased in size and number compared to prior. Appearance is atypical for multiple myeloma but may represent a combination of treated, partially treated, and active disease. No acute pathologic fracture. Findings compatible with enterocolitis of infectious or inflammatory etiology. Involvement of the colon is greater than of the small bowel. Marked hepatic steatosis. Increased sclerosis of bone lesions throughout the thoracolumbar spine, bony pelvis, and proximal left femur. Appearance is atypical for multiple myeloma but may represent a combination of treated, partially treated, and active disease. No acute pathologic fracture.  - 1/29:CMV PCR 309,407WKGS  - 1/31: GI PCR panel positive only for Norovirus.   2/1: LP results:  Neutrophils 6% in tube 1, Lymphocytes 88% (predominant), protein 42 (normal), glucose 36 (low), rapid HSV in CSF negative. CMV CSF PCR negative    - 2/2 CMV PCR 179,555High   - 2/2 Flex Sig Biopsies prelim: "colonic mucosa with a rare small non-necrotizing mucosal granuloma and rare CMV immunolabeling"   - 2/5 [place] work up: sIL2 (1556.6 high), ferritin (948 high), TG(258 high), NKA-IFN? 10 (WNL)  - 2/8: CMV PCR: 3107 (sig improvement)  - 2/10: C diff: negative.  - 2/12: Repeat GI viral PCR: positive for Norovirus  - 2/15 CMV 755  - 2/17 IgG 745  - 2/22: Repeat GI viral PCR positive for Norovirus    - 2/22  CMV 74    Plan:  - appreciate ID recs  -- Ganciclovir 5 mg/kg (1/31-2/21). S/p one dose of IVIG on 2/7.   -- S/p Valcyte 900 mg BID (2/21 -> 2/23) -> transitioned to maintenance Valcyte 449m BID   -- S/p Alinia (nitazoxanide) for 7 days (2/16 - 2/22) for norovirus treatment. Repeat GI viral PCR was persistently positive for Norovirus on 2/22, but ID said it is likely from colonization. No need to treat further  -- s/p IV Zosyn 4.5 g Q8hrs (1/26-1/28, restarted 1/29 - 2/1 for recurrent fever)  -- GI consult: appreciate recs: flex sig 2/2  -- Weekly CMV  -- Rifaximin for hepatic encephalopathy and SIBO as below     # Acute liver injury of unclear etiology  # Acute on chronic encephalopathy likely 2/2 infectious vs hepatic vs metabolic  # Hyperbilirubinemia  # Anasarca  # Hypoalbuminemia    Data:   -Steady rise in LFTs in 05/2021,  -12/02/21: AST 152--> 121, ALT 130--> 110, TB 5.4, Albumin 1.1  -Hepatitis panel negative.   -1/26:  Tbili 14.6, GGT 1160, ammonia 25  -CTAP 12/02/21: Scattered sclerotic lesions in the lumbar spine, hepatic steatosis.   -RUQ ultrasound: liver enlarged with diffuse increased echogenicity, consistent with fatty infiltration. 2cm stone in the gallbladder.   -HIDA scan: Normal radiotracer filling of the gallbladder is identified, therefore the exam is not consistent with cystic duct  obstruction or acute cholecystitis.  - [place] 1/27: Hepatomegaly with diffusely increased echogenicity which can be seen with hepatic amyloidosis or steatosis. Please refer to the recently performed biopsy for further details. Mild perihepatic ascites. Patent hepatic vasculature with normal waveforms. Cholelithiasis without acute cholecystitis. Right pleural effusion.  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 CMV stains: Negative.  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 Could be drug related. Increase Ursodiol to 600 mg BID, Check Hepatitis E. 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 Unlikely diarrhea from Ursodiol.  - Avoiding steroids  - Daily LFT/INR  - RD consult placed to expand diet to more than protein shakes.  -- Family requesting appetite stimulant but risk > benefit.     # Acute toxic encephalopathy, improving  In the setting of liver failure, ammonia only 25. LP performed 2/1 to r/o encephalitis.     Data:   1/31: HIV, TSH, Folate, B12 WNL  1/30: CT head WNL other than sinus disease  2/1: LP results: Neutrophils 6% in tube 1, Lymphocytes 88% (predominant), protein 42 (normal), glucose 36 (low), rapid HSV in CSF negative. CMV CSF PCR negative  2/5: Brain MRI: Smooth diffuse  dural thickening and enhancement as well as bilateral holohemispheric subdural effusions, which may be on the basis of diminished intracranial pressure following recent lumbar puncture.  -2/8: EEG: non-specific mild cerebral dysfunction.  -2/9: CT  Brain: similar to prior MRI with hyperdense areas likely hygromas    Plan:  - Neurology consult: 2/7: Encephalopathy likely from underlying medical disease. Delirium prevention. Avoid Ativan and try Seroquel for delirium. MRI findings is from LP. Okay to try caffiene tablets in the daytime to help with postural headache but patient is allergic to it.  -2/8: Repeat CT head to monitor the status of subdural effusion: stable, EEG: diffuse slowing, Thiamine iv prophylaxis.  -Speech path: Regular and thin liquid. Slightly higher risk for aspiration given encephalopathy.  - Start empiric rifaximin given possibility of HE without elevated ammonia (2/14 ->)  -- Rifaximin dose adjusted to cover for SIBO, then de-escalated back to HE coverage on 2/22   -- holding off on lactulose given diarrhea    # Deconditioning  Pt has not been engaging much with PT/OT despite improved mental status. May be due in part to anxieties about diarrhea/incontinence.  - continue PT/OT  - continue encouragement to participate, particularly by family/familiar faces    # Hypokalemia:  # Hypomagnesemia:  Replete as needed.    # Vitamin D deficiency:  -Supplements ordered.    # Moderate malnutrition:  -Seen by RD and given recs for supplements.    # History of PE, diagnosed in April 2019  - hold home [place] for now given recent anemia and concerns for bleeding.   - monitor for signs and symptoms of worsening bleeding.     # Elevated TSH at OSH, likely 2/2 subclinical hypothyroidism  -TSH WNL on 12/15/20.      # Anxiety, emotional distress:   -Patient has stressful social situation, intellectually disabled son, and regarding underlying disease process.     # Chronic buttocks pressure wound  - Wound care  consult, appreciate recs  - Position changes as tolerated, waffle cushion, etc  -- Pressure wound ruled out, treating bilateral buttocks moisture and friction damage   - Rectal tube placed 2/18 to avoid contamination of wounds by stool    Wound care:  - Bilateral Groin/Thighs   Gently cleanse with warm water & Remedy no-rinse foam (pH-balanced cleanser) with soft cloths, pat dry ensuring   thoroughly dried.   Do not use CHG to rash areas.   Apply Clotrimazole ointment to rash BID, ensure thoroughly rubbed in.   Apply a piece of Interdry textile [phone] leaving 2 inches exposed from fold to facilitate wicking.     - Bilateral Buttocks   First rinse with perineal irrigation bottle & warm water to remove as much stool as possible to minimize wiping.   Cleanse wounds & surrounding skin with Anasept wound cleanser spray ([id number]), pat dry with sterile gauze.   Apply a thick layer of Triad hydrophilic wound dressing paste ([id number]#[id number]) over wounds & periwound areas.   Promptly clean patient after episodes of incontinence using method described.   Remove only the top, soiled layer of Triad during incontinence cleaning to avoid further eroding the skin & apply another top   layer of Triad following cleaning.   Apply Triad at least BID & PRN to keep area protected.     - Pressure Injury Prevention   Continue q2h repositioning- 30 degree lateral turn to L & R only unless medically contraindicated to avoid direct pressure on   sacrum.   Place Waffle mattress overlay [phone] under patient in bed to assist with pressure redistribution.   Use waffle cushion while OOB to chair, reposition frequently while in chair every 30 minutes.   Additionally, float  heels completely off of bed. If pillows do not suffice, order heel offloading boots (PMM# [id number]).    # VTE PPx:  Contraindicated: Other: anticipate Plt <50    Severity of Illness  Stable     During this hospitalization the patient is also being treated for:  - Sepsis  (present on admission)    - Malignancy associated fatigue on admission  - Neoplasm/malignancy associated pain  - Hypoalbuminemia (present on admission)  - Fluid status: Hypovolemia on admission     - Cachexia  - Encephalopathy: Metabolic encephalopathy   - Pancytopenia 2/2 chemotherapy    Code Status: FULL    [name], MD  01/09/22

## 2022-01-10 LAB — COMPLETE BLOOD COUNT WITH DIFFERENTIAL
Abs Basophils: 0 10*9/L (ref 0.00–0.10)
Abs Eosinophils: 0.01 10*9/L (ref 0.00–0.40)
Abs Imm Granulocytes: 0.05 10*9/L (ref <0.10)
Abs Lymphocytes: 0.21 10*9/L — ABNORMAL LOW (ref 1.00–3.40)
Abs Monocytes: 0.19 10*9/L — ABNORMAL LOW (ref 0.20–0.80)
Abs Neutrophils: 2.71 10*9/L (ref 1.80–6.80)
Hematocrit: 27.7 % — ABNORMAL LOW (ref 36.0–46.0)
Hemoglobin: 9.2 g/dL — ABNORMAL LOW (ref 12.0–15.5)
MCH: 34.8 pg — ABNORMAL HIGH (ref 26.0–34.0)
MCHC: 33.2 g/dL (ref 31.0–36.0)
MCV: 105 fL — ABNORMAL HIGH (ref 80–100)
MPV: 13.4 fL — ABNORMAL HIGH (ref 9.1–12.6)
Platelet Count: 51 10*9/L — ABNORMAL LOW (ref 140–450)
RBC Count: 2.64 10*12/L — ABNORMAL LOW (ref 4.00–5.20)
RDW-CV: UNDETERMINED % (ref 11.7–14.4)
WBC Count: 3.2 10*9/L — ABNORMAL LOW (ref 3.4–10.0)

## 2022-01-10 LAB — COMPREHENSIVE METABOLIC PANEL (BMP, AST, ALT, T.BILI, ALKP, TP ALB)
AST: 77 U/L — ABNORMAL HIGH (ref 5–44)
Alanine transaminase: 64 U/L — ABNORMAL HIGH (ref 10–61)
Albumin, Serum / Plasma: 1.2 g/dL — ABNORMAL LOW (ref 3.4–4.8)
Alkaline Phosphatase: 372 U/L — ABNORMAL HIGH (ref 38–108)
Anion Gap: 5 (ref 4–14)
Bilirubin, Total: 11.2 mg/dL — ABNORMAL HIGH (ref 0.2–1.2)
Calcium, total, Serum / Plasma: 7.4 mg/dL — ABNORMAL LOW (ref 8.4–10.5)
Carbon Dioxide, Total: 24 mmol/L (ref 22–29)
Chloride, Serum / Plasma: 113 mmol/L — ABNORMAL HIGH (ref 101–110)
Creatinine: 0.33 mg/dL — ABNORMAL LOW (ref 0.55–1.02)
Glucose, non-fasting: 82 mg/dL (ref 70–199)
Potassium, Serum / Plasma: 3.4 mmol/L — ABNORMAL LOW (ref 3.5–5.0)
Protein, Total, Serum / Plasma: 3.3 g/dL — ABNORMAL LOW (ref 6.3–8.6)
Sodium, Serum / Plasma: 142 mmol/L (ref 135–145)
Urea Nitrogen, Serum / Plasma: 2 mg/dL — ABNORMAL LOW (ref 7–25)
eGFRcr: 118 mL/min/{1.73_m2} (ref >59)

## 2022-01-10 LAB — PROTHROMBIN TIME
INR: 1.4 — ABNORMAL HIGH (ref 0.9–1.2)
PT: 16.4 s — ABNORMAL HIGH (ref 11.6–15.0)

## 2022-01-10 LAB — BASIC METABOLIC PANEL (NA, K, CL, CO2, BUN, CR, GLU, CA)
Anion Gap: 5 (ref 4–14)
Calcium, total, Serum / Plasma: 7.4 mg/dL — ABNORMAL LOW (ref 8.4–10.5)
Carbon Dioxide, Total: 23 mmol/L (ref 22–29)
Chloride, Serum / Plasma: 112 mmol/L — ABNORMAL HIGH (ref 101–110)
Creatinine: 0.33 mg/dL — ABNORMAL LOW (ref 0.55–1.02)
Glucose, non-fasting: 82 mg/dL (ref 70–199)
Potassium, Serum / Plasma: 3.4 mmol/L — ABNORMAL LOW (ref 3.5–5.0)
Sodium, Serum / Plasma: 140 mmol/L (ref 135–145)
Urea Nitrogen, Serum / Plasma: 2 mg/dL — ABNORMAL LOW (ref 7–25)
eGFRcr: 118 mL/min/{1.73_m2} (ref >59)

## 2022-01-10 LAB — FIBRINOGEN, FUNCTIONAL: Fibrinogen, Functional: 172 mg/dL — ABNORMAL LOW (ref 202–430)

## 2022-01-10 LAB — BILIRUBIN, DIRECT: Bilirubin, Direct: 8 mg/dL — ABNORMAL HIGH (ref <0.6)

## 2022-01-10 LAB — MAGNESIUM, SERUM / PLASMA: Magnesium, Serum / Plasma: 1.7 mg/dL (ref 1.6–2.6)

## 2022-01-10 MED ORDER — SIMETHICONE 80 MG CHEWABLE TABLET
80 | Freq: Two times a day (BID) | ORAL | 0.00 refills | 10.50000 days | Status: AC | PRN
Start: 2022-01-10 — End: 2022-04-20

## 2022-01-10 MED ORDER — POTASSIUM CHLORIDE 20 MEQ/50 ML IN STERILE WATER INTRAVENOUS PIGGYBACK
20 | Freq: Once | INTRAVENOUS | Status: AC
Start: 2022-01-10 — End: 2022-01-10
  Administered 2022-01-11: 06:00:00 via INTRAVENOUS

## 2022-01-10 MED ORDER — SIMETHICONE 80 MG CHEWABLE TABLET
80 mg | Freq: Two times a day (BID) | ORAL | Status: AC | PRN
Start: 2022-01-10 — End: 2022-01-15
  Administered 2022-01-10: 23:00:00 80 mg via ORAL
  Administered 2022-01-13: 06:00:00 via ORAL
  Administered 2022-01-14 – 2022-01-15 (×2): 80 mg via ORAL

## 2022-01-10 MED ORDER — ALUMINUM-MAG HYDROXIDE-SIMETHICONE 200 MG-200 MG-20 MG/5 ML ORAL SUSP
200-200-205 mg/5 mL | Freq: Three times a day (TID) | ORAL | Status: AC | PRN
Start: 2022-01-10 — End: 2022-01-15
  Administered 2022-01-10: 22:00:00 30 mL via ORAL
  Administered 2022-01-13 – 2022-01-15 (×3): via ORAL

## 2022-01-10 MED ORDER — ALUMINUM-MAG HYDROXIDE-SIMETHICONE 200 MG-200 MG-20 MG/5 ML ORAL SUSP
200-200-205 mg/5 mL | Freq: Three times a day (TID) | ORAL | Status: DC | PRN
Start: 2022-01-10 — End: 2022-04-20

## 2022-01-10 MED ORDER — POTASSIUM CHLORIDE 20 MEQ/50 ML IN STERILE WATER INTRAVENOUS PIGGYBACK
20 | Freq: Once | INTRAVENOUS | Status: AC
Start: 2022-01-10 — End: 2022-01-10
  Administered 2022-01-11: 04:00:00 via INTRAVENOUS

## 2022-01-10 MED FILL — XIFAXAN 550 MG TABLET: 550 mg | ORAL | Qty: 1

## 2022-01-10 MED FILL — DICYCLOMINE 10 MG CAPSULE: 10 mg | ORAL | Qty: 2

## 2022-01-10 MED FILL — MAG-AL PLUS 200 MG-200 MG-20 MG/5 ML ORAL SUSPENSION: 200-200-20 mg/5 mL | ORAL | Qty: 30

## 2022-01-10 MED FILL — MAGNESIUM SULFATE 2 GRAM/50 ML (4 %) IN WATER INTRAVENOUS PIGGYBACK: 2 gram/50 mL (4 %) | INTRAVENOUS | Qty: 50

## 2022-01-10 MED FILL — THIAMINE HCL (VITAMIN B1) 100 MG/ML INJECTION SOLUTION: 100 mg/mL | INTRAMUSCULAR | Qty: 2

## 2022-01-10 MED FILL — POTASSIUM CHLORIDE 20 MEQ/50 ML IN STERILE WATER INTRAVENOUS PIGGYBACK: 20 mEq/50 mL | INTRAVENOUS | Qty: 50

## 2022-01-10 MED FILL — VITAMIN E (DL, ACETATE) 45 MG (100 UNIT) CAPSULE: 45 mg (100 unit) | ORAL | Qty: 8

## 2022-01-10 MED FILL — FLINTSTONES COMPLETE (IRON) CHEWABLE TABLET: ORAL | Qty: 1

## 2022-01-10 MED FILL — URSODIOL (BULK) 100 % POWDER: 100 % | Qty: 0.6

## 2022-01-10 MED FILL — VALGANCICLOVIR 450 MG TABLET: 450 mg | ORAL | Qty: 1

## 2022-01-10 MED FILL — GAS RELIEF 80 (SIMETHICONE) 80 MG CHEWABLE TABLET: 80 mg | ORAL | Qty: 1

## 2022-01-10 MED FILL — CHOLECALCIFEROL (VITAMIN D3) 25 MCG (1,000 UNIT) TABLET: 1000 UNITS | ORAL | Qty: 4

## 2022-01-10 MED FILL — DILAUDID (PF) 0.5 MG/0.5 ML INJECTION SYRINGE: 0.5 mg/ mL | INTRAMUSCULAR | Qty: 0.5

## 2022-01-10 MED FILL — POTASSIUM CHLORIDE 40 MEQ/100ML IN STERILE WATER INTRAVENOUS PIGGYBACK: 40 mEq/100 mL | INTRAVENOUS | Qty: 100

## 2022-01-10 MED FILL — MAGNESIUM SULFATE 1 GRAM/100 ML IN DEXTROSE 5 % INTRAVENOUS PIGGYBACK: 1 g/00 mL | INTRAVENOUS | Qty: 100

## 2022-01-10 MED FILL — MELATONIN 1 MG TABLET: 1 mg | ORAL | Qty: 1

## 2022-01-10 NOTE — Progress Notes (Signed)
[facility] PROGRESS NOTE  ATTENDING ONLY     My date of service is 01/10/22.    24 Hour Course  NAEON. No beds available at [facility]. Daughter updated.     Subjective  C/o some abdominal bloating. Mylanta might have helped yesterday. Denies constipation.  Daughter and son at bedside.    Vitals  Temp:  [36.6 C (97.9 F)-37.4 C (99.3 F)] 36.6 C (97.9 F)  Heart Rate:  [65-78] 74  *Resp:  [16-18] 18  BP: (107-126)/(70-81) 110/76  SpO2:  [94 %-98 %] 98 %    MostRecent Weight: 90.1 kg (198 lb 10.2 oz)  Admission Weight: 79.6 kg (175 lb 7.8 oz)      Intake/Output Summary (Last 24 hours) at 01/10/2022 1336  Last data filed at 01/10/2022 1321  Gross per 24 hour   Intake 100 ml   Output 2175 ml   Net -2075 ml     Pain Score: 0    Physical Exam  Vitals reviewed.   Constitutional:       Appearance: She is ill-appearing.   HENT:      Head: Normocephalic and atraumatic.      Nose: Nose normal.      Mouth/Throat:      Mouth: Mucous membranes are moist.      Pharynx: Oropharynx is clear.   Eyes:      General: Scleral icterus present.      Extraocular Movements: Extraocular movements intact.      Pupils: Pupils are equal, round, and reactive to light.   Cardiovascular:      Rate and Rhythm: Normal rate and regular rhythm.   Pulmonary:      Effort: Pulmonary effort is normal. No respiratory distress.   Abdominal:      General: Abdomen is flat. There is no distension.      Palpations: Abdomen is soft. There is no shifting dullness or fluid wave.      Tenderness: There is abdominal tenderness (mild, diffuse).   Musculoskeletal:      Cervical back: Normal range of motion and neck supple.      Right lower leg: No edema.      Left lower leg: No edema.   Skin:     General: Skin is warm and dry.      Coloration: Skin is jaundiced.   Neurological:      Mental Status: She is alert and oriented to person, place, and time.   Psychiatric:         Mood and Affect: Mood normal.         Behavior: Behavior is cooperative.      Scheduled Meds:   0.9% sodium chloride flush  3 mL Intravenous Q12H [place]    cholecalciferol (vitamin D3)  4,000 Units Oral Daily [place]    clotrimazole   Topical BID SCH    dicyclomine  20 mg Oral 4x Daily [place]    dry mouth oral rinse  5 mL Mucous Membrane 4x Daily [place]    melatonin  6 mg Oral Daily At Bedtime [facility]    multivitamin complete chewable  1 tablet Oral Daily [place]    QUEtiapine  50 mg Oral Once    rifAXIMin  550 mg Oral BID [place]    thiamine  200 mg Intravenous Q24H    ursodiol  600 mg Oral BID [facility]    valGANciclovir  450 mg Oral BID [place]    vitamin E (dl, acetate)  800 Units Oral  Daily [place]     Continuous Infusions:   dextrose 5 % and 0.9 % sodium chloride 75 mL/hr (01/10/22 0613)     PRN Meds:   0.9% sodium chloride flush  3 mL Intravenous PRN    albuterol  2.5 mg Nebulization Q4H PRN    Or    albuterol  2.5 mg Nebulization Q4H PRN    HYDROmorphone  0.3-0.8 mg Intravenous Q4H PRN    hydrOXYzine  10 mg Oral Q8H PRN    loperamide  4 mg Oral 4x Daily PRN    magnesium sulfate in dextrose 5 %  2-4 g Intravenous Daily PRN    Or    magnesium sulfate in water  2-4 g Intravenous Daily PRN    ondansetron  8 mg Intravenous Q8H PRN    polyethylene glycol  17 g Oral Daily PRN    potassium chloride in sterile water  20-80 mEq Intravenous Daily PRN    Or    potassium chloride in sterile water  20-80 mEq Intravenous Daily PRN    Or    potassium chloride  20-80 mEq Oral Daily PRN     Data    CBC        01/10/22  0613 01/09/22  0417   WBC 3.2* 3.6   HGB 9.2* 9.1*   HCT 27.7* 27.7*   PLT 51* 57*     Coags        01/10/22  0613 01/09/22  0417   INR 1.4* 1.4*     Chem7        01/10/22  0613 01/09/22  2143 01/09/22  0651 01/09/22  0417   NA 142 140 140 131*   K 3.4* 3.4* 3.2* >10.0*   CL 113* 112* 113* 126*   CO2 _0 21*   BUN 2* 2* 2* 2*   CREAT 0.33* 0.33* 0.34* 0.32*   GLU 82 82 88 87     Electrolytes        01/10/22  0613 01/09/22  2143 01/09/22  0651 01/09/22  0417   CA 7.4* 7.4* 7.4* 6.9*   MG 1.7   --   --  1.8     Liver Panel        01/10/22  0613 01/09/22  0417   AST 77* 72*   ALT 64* 61   ALKP 372* 362*   TBILI 11.2* 10.7*   TP 3.3* 3.0*   ALB 1.2* 1.1*     Lactate  No results found in last 36 hours    Microbiology Results (last 24 hours)     ** No results found for the last 24 hours. **        Radiology Results  No results found.     I discussed the patient with Carmin Muskrat, MD from [facility] regarding A&P.    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 The patient has received VRd X 4 (achieving VGPR) then Mel 200 ASCT 05/12/11. 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 She met the criteria for PD on 02/19/2020 and was started on third line DARA PD in June 2021, which was recently held on 10/20/21 due to chronic diarrhea. Patient was transferred OSH after recent hypovolemic/distributive shock. Here for acute encephalopathy workup and acute liver failure workup.      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   June 2020 due to progression of M spike Revlimid was increased to 15 mg PO D1-21 every 28 days, plus dexamethasone  July 2021: started daratumumab, Pomylast 2 mg D 1-21, Q28 days, dexamethasone 12 mg PO Q weekly.   Primary oncologist: Dr. Iona Coach,  Dr. Kennon Rounds [place] [facility]  Receiving dexamethasone 12 mg weekly for diarrhea  Receiving MT with daratumumab with pomalyst and dex, most recent dose 10/16/21, has since been on hold due to worsening diarrhea.     DATA:   09/07/2010: IgG Kappa MM  12/02/2021: Ig M <5, IgG 447, IgA 20, Ig E <2, SFLC Kappa 16.6, Lambda 1.6, Kappa/Lamda 10.38  12/14/21: IgG K on IFE, IgG 430, KLC 16.6, LLC 2.7, KLR 6.15    Chemo:   Holding Daratumumab and Pomalyst, last  dose 10/20/21, given recent infection and acute liver failure.    Supportive Care:   Transfusion: Keep Hg >7, and Platelets >10.   Per daughter, has autoantibodies in blood was a difficult match. Would like to be contacted prior to transfusion.   Line: PICC placed 1/26    Outpatient Oncologist: Dr. Kennon Rounds    Dispo: Given improvement in diarrhea and slowly improving Tbili (will take weeks to months per hepatology), plan to transfer back to prior OSH once bed available    # Immunocompromised  # CMV colitis  # At risk for infections due to malignancy  # Shock, likely hypovolemic vs distributive in the setting of chronic diarrhea, + norovirus infection   # Acute on chronic diarrhea, norovirus positive., diff and OP negative.   -No prior evidence of adrenal insufficiency, AM cortisol levels normal, previously on dexamethasone 8 mg PO Qweekly, but has been held since early December.    Data:  - CT brain: No acute intracranial hemorrhage, herniation, or hydrocephalus.  - CT chest/A/P pending: Medium-sized bilateral pleural effusions with bilateral peribronchovascular groundglass opacities, suggestive of pulmonary edema. Clustered nodules in right middle lobe along the right minor fissure, which could represent mild aspiration or infection. Numerous sclerotic and mixed lytic/sclerotic lesions throughout the thoracic spine and bilateral ribs, increased in size and number compared to prior. Appearance is atypical for multiple myeloma but may represent a combination of treated, partially treated, and active disease. No acute pathologic fracture. Findings compatible with enterocolitis of infectious or inflammatory etiology. Involvement of the colon is greater than of the small bowel. Marked hepatic steatosis. Increased sclerosis of bone lesions throughout the thoracolumbar spine, bony pelvis, and proximal left femur. Appearance is atypical for multiple myeloma but may represent a combination of treated, partially treated,  and active disease. No acute pathologic fracture.  - 1/29:CMV PCR [id number]  - 1/31: GI PCR panel positive only for Norovirus.   2/1: LP results: Neutrophils 6% in tube 1, Lymphocytes 88% (predominant), protein 42 (normal), glucose 36 (low), rapid HSV in CSF negative. CMV CSF PCR negative    - 2/2 CMV PCR 179,555High   - 2/2 Flex Sig Biopsies prelim: "colonic mucosa with a rare small non-necrotizing mucosal granuloma and rare CMV immunolabeling"   - 2/5 [place] work up: sIL2 (1556.6 high), ferritin (948 high), TG(258 high), NKA-IFN? 10 (WNL)  - 2/8: CMV PCR: 3107 (sig improvement)  - 2/10: C diff: negative.  - 2/12: Repeat GI viral PCR: positive for Norovirus  -  2/15 CMV 755  - 2/17 IgG 745  - 2/22: Repeat GI viral PCR positive for Norovirus    - 2/22 CMV 74    Plan:  - appreciate ID recs  -- Ganciclovir 5 mg/kg (1/31-2/21). S/p one dose of IVIG on 2/7.   -- S/p Valcyte 900 mg BID (2/21 -> 2/23) -> transitioned to maintenance Valcyte 461m BID   -- S/p Alinia (nitazoxanide) for 7 days (2/16 - 2/22) for norovirus treatment. Repeat GI viral PCR was persistently positive for Norovirus on 2/22, but ID said it is likely from colonization. No need to treat further  -- s/p IV Zosyn 4.5 g Q8hrs (1/26-1/28, restarted 1/29 - 2/1 for recurrent fever)  -- GI consult: appreciate recs: flex sig 2/2  -- Weekly CMV  -- Rifaximin for hepatic encephalopathy and SIBO as below     # Acute liver injury of unclear etiology  # Acute on chronic encephalopathy likely 2/2 infectious vs hepatic vs metabolic  # Hyperbilirubinemia  # Anasarca  # Hypoalbuminemia    Data:   -Steady rise in LFTs in 05/2021,  -12/02/21: AST 152--> 121, ALT 130--> 110, TB 5.4, Albumin 1.1  -Hepatitis panel negative.   -1/26:  Tbili 14.6, GGT 1160, ammonia 25  -CTAP 12/02/21: Scattered sclerotic lesions in the lumbar spine, hepatic steatosis.   -RUQ ultrasound: liver enlarged with diffuse increased echogenicity, consistent with fatty infiltration. 2cm stone in the  gallbladder.   -HIDA scan: Normal radiotracer filling of the gallbladder is identified, therefore the exam is not consistent with cystic duct obstruction or acute cholecystitis.  - [place] 1/27: Hepatomegaly with diffusely increased echogenicity which can be seen with hepatic amyloidosis or steatosis. Please refer to the recently performed biopsy for further details. Mild perihepatic ascites. Patent hepatic vasculature with normal waveforms. Cholelithiasis without acute cholecystitis. Right pleural effusion.  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 CMV stains: Negative.  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 Could be drug related. Increase Ursodiol to 600 mg BID, Check Hepatitis E. 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 Unlikely diarrhea from Ursodiol.  - Avoiding steroids  - Daily LFT/INR  - RD consult placed to expand diet to more than protein shakes.  -- Family requesting appetite stimulant but risk > benefit.     # Acute toxic encephalopathy, improving  In the setting of liver failure, ammonia only 25. LP performed 2/1 to r/o encephalitis.     Data:   1/31: HIV, TSH, Folate, B12 WNL  1/30: CT head WNL other than sinus disease  2/1: LP results: Neutrophils 6% in tube 1, Lymphocytes  88% (predominant), protein 42 (normal), glucose 36 (low), rapid HSV in CSF negative. CMV CSF PCR negative  2/5: Brain MRI: Smooth diffuse dural thickening and enhancement as well as bilateral holohemispheric subdural effusions, which  may be on the basis of diminished intracranial pressure following recent lumbar puncture.  -2/8: EEG: non-specific mild cerebral dysfunction.  -2/9: CT Brain: similar to prior MRI with hyperdense areas likely hygromas    Plan:  - Neurology consult: 2/7: Encephalopathy likely from underlying medical disease. Delirium prevention. Avoid Ativan and try Seroquel for delirium. MRI findings is from LP. Okay to try caffiene tablets in the daytime to help with postural headache but patient is allergic to it.  -2/8: Repeat CT head to monitor the status of subdural effusion: stable, EEG: diffuse slowing, Thiamine iv prophylaxis.  -Speech path: Regular and thin liquid. Slightly higher risk for aspiration given encephalopathy.  - Start empiric rifaximin given possibility of HE without elevated ammonia (2/14 ->)  -- Rifaximin dose adjusted to cover for SIBO, then de-escalated back to HE coverage on 2/22   -- holding off on lactulose given diarrhea    # Deconditioning  Pt has not been engaging much with PT/OT despite improved mental status. May be due in part to anxieties about diarrhea/incontinence.  - continue PT/OT  - continue encouragement to participate, particularly by family/familiar faces    # Bloating  May be due to indigestion. Denies constipation. Exam not c/w significant ascites.  - mylanta, simethicone prn    # Hypokalemia:  # Hypomagnesemia:  Replete as needed.    # Vitamin D deficiency:  -Supplements ordered.    # Moderate malnutrition:  -Seen by RD and given recs for supplements.    # History of PE, diagnosed in April 2019  - hold home [place] for now given recent anemia and concerns for bleeding.   - monitor for signs and symptoms of worsening bleeding.     # Elevated TSH at OSH,  likely 2/2 subclinical hypothyroidism  -TSH WNL on 12/15/20.      # Anxiety, emotional distress:   -Patient has stressful social situation, intellectually disabled son, and regarding underlying disease process.     # Chronic buttocks pressure wound  - Wound care consult, appreciate recs  - Position changes as tolerated, waffle cushion, etc  -- Pressure wound ruled out, treating bilateral buttocks moisture and friction damage   - Rectal tube placed 2/18 to avoid contamination of wounds by stool    Wound care:  - Bilateral Groin/Thighs   Gently cleanse with warm water & Remedy no-rinse foam (pH-balanced cleanser) with soft cloths, pat dry ensuring   thoroughly dried.   Do not use CHG to rash areas.   Apply Clotrimazole ointment to rash BID, ensure thoroughly rubbed in.   Apply a piece of Interdry textile [phone]) leaving 2 inches exposed from fold to facilitate wicking.     - Bilateral Buttocks   First rinse with perineal irrigation bottle & warm water to remove as much stool as possible to minimize wiping.   Cleanse wounds & surrounding skin with Anasept wound cleanser spray ([id number]#[id number]), pat dry with sterile gauze.   Apply a thick layer of Triad hydrophilic wound dressing paste ([id number]#[id number]) over wounds & periwound areas.   Promptly clean patient after episodes of incontinence using method described.   Remove only the top, soiled layer of Triad during incontinence cleaning to avoid further eroding the skin & apply another top   layer of Triad following cleaning.   Apply Triad at least BID & PRN to keep area protected.     - Pressure Injury Prevention   Continue q2h repositioning- 30 degree lateral turn to L & R only unless medically  contraindicated to avoid direct pressure on   sacrum.   Place Waffle mattress overlay [phone]) under patient in bed to assist with pressure redistribution.   Use waffle cushion while OOB to chair, reposition frequently while in chair every 30 minutes.   Additionally, float heels  completely off of bed. If pillows do not suffice, order heel offloading boots (PMM# [id number]).      # VTE PPx:  Contraindicated: Other: anticipate Plt <50    Severity of Illness  Stable     During this hospitalization the patient is also being treated for:  - Sepsis (present on admission)    - Malignancy associated fatigue on admission  - Neoplasm/malignancy associated pain  - Hypoalbuminemia (present on admission)  - Fluid status: Hypovolemia on admission     - Cachexia  - Encephalopathy: Metabolic encephalopathy   - Pancytopenia 2/2 chemotherapy    Code Status: FULL    [name], MD  01/10/22

## 2022-01-10 NOTE — Plan of Care (Signed)
Problem: Fall, at Risk or Actual - Adult  Goal: Absence of falls and fall related injury  Outcome: Progress within 12 hours  Goal: Knowledge of fall prevention  Outcome: Progress within 12 hours     Problem: Delirium - Adult / Pediatric  Goal: Absence or resolution of delirium  Outcome: Progress within 12 hours     Problem: Pain,  Acute / Chronic- Adult  Goal: Control of chronic pain  Outcome: Progress within 12 hours  Goal: Control of acute pain  Outcome: Progress within 12 hours     Problem: Activity Intolerance / Fatigue - Hematological / Immunological / Oncological Condition - Adult  Goal: Able to perform physical activity as ordered  Outcome: Progress within 12 hours     Problem: Coping Ineffective, Patient / Family / Caregiver - Hematological / Immunological / Oncological Condition - Adult  Goal: Effective coping (Patient / Family / Caregiver)  Outcome: Progress within 12 hours     Problem: Fluid Volume, Imbalanced - Hematological / Immunological / Oncological Condition - Adult  Goal: Absence of fluid imbalance signs and symptoms  Outcome: Progress within 12 hours     Problem: Gas Exchange, Impaired- Hematological / Immunological / Oncological Condition - Adult  Goal: Adequate oxygenation (absence of signs and symptoms of hypoxemia)  Outcome: Progress within 12 hours     Problem: GI Elimination Impaired - Hematological / Immunological / Oncological Condition - Adult  Goal: Passage of soft, formed stool  Outcome: Progress within 12 hours     Problem: GU Elimination Impaired - Hematological / Immunological / Oncological Condition - Adult  Goal: Absence of urinary tract infection signs and symptoms  Outcome: Progress within 12 hours     Problem: Infection, at Risk and Actual - Hematological / Immunological / Oncological Condition - Adult  Goal: Prevention of infection  Outcome: Progress within 12 hours     Problem: Injury, High Risk for - Hematological/Immunological/Oncological Condition - Adult  Goal: Absence  of injury  Outcome: Progress within 12 hours     Problem: Nutrition, Alteration in - Hematological / Immunological / Oncological Condition - Adult  Goal: Adequate nutritional intake  Outcome: Progress within 12 hours     Problem: Oral Mucus Membranes, at Risk or Alteration in Integrity - Immunological / Oncological Condition - Adult  Goal: Maintain or restore integrity of oral mucus membranes  Outcome: Progress within 12 hours     Problem: Skin Integrity, Impaired - Hematological / Immunological / Oncological Condition - Adult  Goal: Skin / incision / wound healing  Outcome: Progress within 12 hours

## 2022-01-10 NOTE — Plan of Care (Signed)
Problem: Discharge Planning - Adult  Goal: Knowledge of and participation in plan of care  Outcome: Progress within 12 hours     Problem: Fall, at Risk or Actual - Adult  Goal: Absence of falls and fall related injury  Outcome: Progress within 12 hours  Goal: Knowledge of fall prevention  Outcome: Progress within 12 hours     Problem: Delirium - Adult / Pediatric  Goal: Absence or resolution of delirium  Outcome: Progress within 12 hours     Problem: Pain,  Acute / Chronic- Adult  Goal: Control of chronic pain  Outcome: Progress within 12 hours  Goal: Control of acute pain  Outcome: Progress within 12 hours     Problem: Activity Intolerance / Fatigue - Hematological / Immunological / Oncological Condition - Adult  Goal: Able to perform physical activity as ordered  Outcome: Progress within 12 hours     Problem: Fluid Volume, Imbalanced - Hematological / Immunological / Oncological Condition - Adult  Goal: Absence of fluid imbalance signs and symptoms  Outcome: Progress within 12 hours     Problem: Gas Exchange, Impaired- Hematological / Immunological / Oncological Condition - Adult  Goal: Adequate oxygenation (absence of signs and symptoms of hypoxemia)  Outcome: Progress within 12 hours     Problem: GI Elimination Impaired - Hematological / Immunological / Oncological Condition - Adult  Goal: Passage of soft, formed stool  Outcome: Progress within 12 hours     Problem: GU Elimination Impaired - Hematological / Immunological / Oncological Condition - Adult  Goal: Absence of urinary tract infection signs and symptoms  Outcome: Progress within 12 hours     Problem: Infection, at Risk and Actual - Hematological / Immunological / Oncological Condition - Adult  Goal: Prevention of infection  Outcome: Progress within 12 hours     Problem: Injury, High Risk for - Hematological/Immunological/Oncological Condition - Adult  Goal: Absence of injury  Outcome: Progress within 12 hours     Problem: Nutrition, Alteration in -  Hematological / Immunological / Oncological Condition - Adult  Goal: Adequate nutritional intake  Outcome: Progress within 12 hours     Problem: Oral Mucus Membranes, at Risk or Alteration in Integrity - Immunological / Oncological Condition - Adult  Goal: Maintain or restore integrity of oral mucus membranes  Outcome: Progress within 12 hours     Problem: Skin Integrity, Impaired - Hematological / Immunological / Oncological Condition - Adult  Goal: Skin / incision / wound healing  Outcome: Progress within 12 hours

## 2022-01-10 NOTE — Interdisciplinary (Addendum)
Case Management Note    10:04a  CM called [facility], [phone]. CM spoke with Daphne. Per Darnelle Bos, she anticipates there will be beds opening up later today after discharges. She will call this CM later in the afternoon with an update.     2:34p  CM called [facility]. Per Daphne, there are no beds as of now. She is still waiting on discharges.  She asked that CM follow up again at 4pm. CM confirmed with [facility], that patient will be placed in isolation room due to current isolation requirements.   CM called patient's daughter, [name] and provided her with an update.     4:04p  CM called [facility]. Per Darnelle Bos, there are no beds today.     Patient placed on Acute Transfer Escalation list.       CM updated MD via voalte.    Loa Socks, MSN, RN Case Manager  CM assisting today only  Desk ext. [phone]/Voalte ext. [phone]   01/10/2022

## 2022-01-10 NOTE — Discharge Summary (Signed)
[facility]     Patient Name: Ariel Braun  Patient MRN: [id number]  Date of Birth: 07-26-1961     Facility: [facility]  Attending Physician: Ariel Braun, Ariel Braun: [phone]    Date of Admission: 12/09/2021  Date of Discharge: 01/10/2022    Admission Diagnosis: Multiple myeloma (CMS code) [C90.00]  Discharge Diagnosis: Multiple myeloma (CMS code)    Discharge Disposition: Other: [facility]    My date of service is 01/10/22.    History (with Chief Complaint)    Ariel Braun is a 61 year old female with pmx of MM. 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 M spike 32, kappa 596.9 mg/L, IgG 4750, B2M 3.88. The patient has received VRd X4 (achieving VGPR) then Mel 200 ASCT 05/12/11. 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 She met the criteria for PD on 02/19/2020 and was started on third line DARA PD in June 2021.     The patient was recently hospitalized at [facility] on 12/02/21 after recommendation of Ariel Braun during a video visit, she was complaining of increased weakness, persistent diarrhea. The patient had reporting having chronic diarrhea since her COVID booster in August 2022 and had developed clinical deterioration over the past month.     Her hospital course was complicated by ICU admission for hypovolemic shock intermittently requiring pressor support (phenylephrine gtt).  She was found to be pancytopenic with hg of 6.6 and Lactate of 11. Patient had initially refused prbc transfusion. Patient was found to be positive for norovirus. LFTs were elevated consistent with acute liver failure (AST 118, [name], Alp 958, TB 12.1). procal 1.2. MRI of the liver with MRCP was done which showed hepatic steatosis. RUQ ultrasound showed an enlarged liver with large 2cm stone in the gallbladder. HIDA scan was done: findings were  not consistent with cystic duct obstruction or acute cholecystitis. The patient was evaluated by GI and advised to follow with hepatology Dr. Loman Braun for acute liver failure workup, liver biopsy. Patient also has a history of pulmonary embolism on eliquis which was held for progressive anemia. Patient was found to have an appropriate rise in cortisol with ACTH stimulation, patient refusing decadron. Creatinine was wnls.     Per daughter, con fusion started about a week prior to coming into the hospital at the last admission. There has been some improvement from baseline. Still having diarrhea, although the frequency of the diarrhea has decreased during course of hospitalization.     Brief Hospital Course by Problem    # MM with lytic lesions in the L spine  IgG Kappa MM on 09/07/2010.  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   June 2020 due to progression of M spike Revlimid was increased to 15 mg PO D1-21 every 28 days, plus dexamethasone  July 2021: started daratumumab, Pomylast 2 mg D 1-21, Q28 days, dexamethasone 12 mg PO Q weekly.   Primary oncologist: Ariel Braun; Ariel Braun Ariel Braun Braun  Receiving dexamethasone 12 mg weekly for diarrhea as outpt  Receiving MT with daratumumab with pomalyst and dex, most recent dose 10/16/21, has since been on hold due to worsening diarrhea.    DATA:   09/07/2010: IgG Kappa MM  12/02/2021: Ig M <5, IgG 447, IgA 20, Ig E <2, SFLC Kappa 16.6, Lambda 1.6, Kappa/Lamda 10.38  12/14/21: IgG K on IFE, IgG 430, KLC 16.6, LLC 2.7, KLR 6.15    Chemo:   Holding Daratumumab and Pomalyst, last dose 10/20/21, given recent infection and acute liver failure.    Supportive Care:   Transfusion: Keep Hg >7, and Platelets >10.   Per daughter, has autoantibodies in blood was a difficult match. Would like to be contacted prior to transfusion.   Line:PICC  placed 1/26, kept in place for transfer    Outpatient Oncologist: Ariel Braun    Dispo: Transfer back to [facility], likely followed by SNF    # Immunocompromised  # CMV colitis  # At risk for infections due tomalignancy  # Shock, likely hypovolemic vs distributive in the setting of chronic diarrhea, + norovirus infection  # Acute on chronic diarrhea, norovirus positive (presumed colonized), O/P negative   -No prior evidence of adrenal insufficiency, AM cortisol levels normal, previously on dexamethasone 8 mg PO Qweekly, but has been held since early December.    Data:  - CT brain: No acute intracranial hemorrhage, herniation, or hydrocephalus.  - CT chest/A/P pending: Medium-sized bilateral pleural effusions with bilateral peribronchovascular groundglass opacities, suggestive of pulmonary edema. Clustered nodules in right middle lobe along the right minor fissure, which could represent mild aspiration or infection. Numerous sclerotic and mixed lytic/sclerotic lesions throughout the thoracic spine and bilateral ribs, increased in size and number compared to prior. Appearance is atypical for multiple myeloma but may represent a combination of treated, partially treated, and active disease. No acute pathologic fracture. Findings compatible with enterocolitis of infectious or inflammatory etiology. Involvement of the colon is greater than of the small bowel. Marked hepatic steatosis. Increased sclerosis of bone lesions throughout the thoracolumbar spine, bony pelvis, and proximal left femur. Appearance is atypical for multiple myeloma but may represent a combination of treated, partially treated, and active disease. No acute pathologic fracture.  - 1/29:CMV PCR [id number]  - 1/31: GI PCR panel positive only for Norovirus.   2/1: LP results: Neutrophils 6% in tube 1, Lymphocytes 88% (predominant), protein 42 (normal), glucose 36 (low), rapid HSV in CSF negative. CMV CSF PCR negative    - 2/2 CMV PCR  179,555High   - 2/2 Flex Sig Biopsies prelim: "colonic mucosa with a rare small non-necrotizing mucosal granuloma and rare CMV immunolabeling"   - 2/5 Ariel work up: sIL2 (1556.6 high), ferritin (948 high), TG(258 high), NKA-IFN? 10 (WNL)  - 2/8: CMV PCR: 3107 (sig improvement)  - 2/10: C diff: negative.  - 2/12: Repeat GI viral PCR: positive for Norovirus  - 2/15 CMV 755  - 2/17 IgG 745  - 2/22: Repeat GI viral PCR positive for Norovirus    - 2/22 CMV 74    Plan:  - appreciate ID recs  -- Ganciclovir 5 mg/kg (1/31-2/21). S/p one dose of IVIG on 2/7.   -- S/p Valcyte 900 mg BID (2/21 -> 2/23) -> transitioned to maintenance Valcyte 448m BID   -- S/p Alinia (nitazoxanide) for 7 days (2/16 - 2/22) for norovirus treatment. Repeat GI viral PCR was persistently positive for Norovirus on 2/22, but ID said it is likely from colonization. No need to treat further  -- s/p IV Zosyn4.5 g Q8hrs (1/26-1/28, restarted 1/29 - 2/1 for recurrent fever)  -- GI consulted, s/p flex sig 2/2  -- Weekly CMV  -- Rifaximin for hepatic encephalopathy and SIBO as  below     # Acute liver injury of unclear etiology  # Acuteon chronicencephalopathy likely 2/2 infectious vs hepaticvs metabolic  # Hyperbilirubinemia  # Anasarca  # Hypoalbuminemia    Data:  -Steady rise in LFTs in 05/2021,  -12/02/21: AST 152--> 121, ALT 130--> 110, TB 5.4, Albumin 1.1  -Hepatitis panel negative.  -1/26:  Tbili 14.6, GGT 1160, ammonia 25  -CTAP 12/02/21: Scattered sclerotic lesions in the lumbar spine, hepatic steatosis.   -RUQ ultrasound: liver enlarged with diffuse increased echogenicity, consistent with fatty infiltration. 2cm stone in the gallbladder.  -HIDA scan:Normal radiotracer filling of the gallbladder is identified, therefore the exam is not consistent with cystic duct obstruction or acute cholecystitis.  - [place] abd 1/27: Hepatomegaly with diffusely increased echogenicity which can be seen with hepatic amyloidosis or steatosis. Please refer to the  recently performed biopsy for further details. Mild perihepatic ascites.Patent hepatic vasculature with normal waveforms. Cholelithiasis without acute cholecystitis. Right pleural effusion.  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 CMV stains: Negative.  - 1/30: Serum studies: Ferritin 2380, iron serum 60, transferrin 46 (low), 93 % sat (high), HEV IgM negative, PCR forVZV negative, HSV negative,CMV+ 443,961High,EBV negative, AMA negative, Hepatitis B core Ab, surface Ab negative, Hep A IgG +/IgM negative  - 2/3 IgG subclass 4: 2 (total 444, subclasses 1/2/3 = 308/25/11 respectively)    Plan:  - Hepatology recs: 2/7: Path looks unlikely for vanishing bile duct syndrome. Could be drug related. Increase Ursodiol to 600 mg BID, Check Hepatitis E. 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 Unlikely diarrhea from Ursodiol.  -Avoiding steroids  - Daily LFT/INR  - RD consult placed to expand diet to more than protein shakes.  -- Family requesting appetite stimulant but risk > benefit.   - Expect improvement over weeks to months    # Acute toxic encephalopathy, improving  In the setting of liver failure, ammonia only 25. LP performed 2/1 to r/o encephalitis.     Data:   1/31: HIV, TSH, Folate, B12 WNL  1/30: CT head WNL other than sinus disease  2/1: LP results: Neutrophils 6% in tube 1, Lymphocytes 88% (predominant), protein 42 (normal), glucose 36 (low), rapid HSV in CSF negative. CMV CSF PCR negative  2/5: Brain MRI: Smooth diffuse dural thickening and enhancement as well as bilateral holohemispheric subdural effusions, which may be on the basis of diminished  intracranial pressure following recent lumbar puncture.  -2/8: EEG: non-specific mild cerebral dysfunction.  -2/9: CT Brain: similar to prior MRI with hyperdense areas likely hygromas    Plan:  - Neurology consult: 2/7: Encephalopathy likely from underlying medical disease. Delirium prevention. Avoid Ativan and try Seroquel for delirium. MRI findings is from LP. Okay to try caffiene tablets in the daytime to help with postural headache but patient is allergic to it.  -2/8: Repeat CT head to monitor the status of subdural effusion: stable, EEG: diffuse slowing, Thiamine iv prophylaxis.  -Speech path: Regular and thin liquid. Slightly higher risk for aspiration given encephalopathy.  - Start empiric rifaximin given possibility of HE without elevated ammonia (2/14 ->)  -- Rifaximin dose adjusted to cover for SIBO, then de-escalated back to HE coverage on  2/22   -- holding off on lactulose given diarrhea    # Deconditioning  Pt has not been engaging much with PT/OT despite improved mental status. May be due in part to anxieties about diarrhea/incontinence.  - continue PT/OT  - continue encouragement to participate, particularly by family/familiar faces  - anticipate placement in SNF/ARU following inpatient admission    # Bloating  May be due to indigestion. Denies constipation. Exam not c/Braun significant ascites.  - mylanta, simethicone prn    # Hypokalemia:  # Hypomagnesemia:  Replete as needed.    # Vitamin D deficiency:  -Supplements ordered.    # Moderate malnutrition:  -Seen by RD and given recs for supplements.    # History of PE, diagnosed in April 2019  - hold home [place] for now givenrecent anemia and concerns for bleeding.  - monitor for signs and symptoms of worsening bleeding.     # Elevated TSH at OSH, likely 2/2 subclinical hypothyroidism  -TSH WNL on 12/15/20.     # Anxiety, emotional distress:  -Patient has stressful social situation, intellectually disabled son, and regarding underlying  disease process.    # Chronic buttocks pressure wound  - Wound care consult, appreciate recs  - Position changes as tolerated, waffle cushion, etc  -- Pressure wound ruled out, treating bilateral buttocks moisture and friction damage   - Rectal tube placed 2/18 to avoid contamination of wounds by stool, removed once diarrhea resolved    Wound care:  - Bilateral Groin/Thighs   Gently cleanse with warm water & Remedy no-rinse foam (pH-balanced cleanser) with soft cloths, pat dry ensuring   thoroughly dried.   Do not use CHG to rash areas.   Apply Clotrimazole ointment to rash BID, ensure thoroughly rubbed in.   Apply a piece of Interdry textile [id number] leaving 2 inches exposed from fold to facilitate wicking.     - Bilateral Buttocks   First rinse with perineal irrigation bottle & warm water to remove as much stool as possible to minimize wiping.   Cleanse wounds & surrounding skin with Anasept wound cleanser spray ([id number]), pat dry with sterile gauze.   Apply a thick layer of Triad hydrophilic wound dressing paste (NKN#[id number]) over wounds & periwound areas.   Promptly clean patient after episodes of incontinence using method described.   Remove only the top, soiled layer of Triad during incontinence cleaning to avoid further eroding the skin & apply another top   layer of Triad following cleaning.   Apply Triad at least BID & PRN to keep area protected.     - Pressure Injury Prevention   Continue q2h repositioning- 30 degree lateral turn to L & R only unless medically contraindicated to avoid direct pressure on   sacrum.   Place Waffle mattress overlay [phone]) under patient in bed to assist with pressure redistribution.   Use waffle cushion while OOB to chair, reposition frequently while in chair every 30 minutes.   Additionally, float heels completely off of bed. If pillows do not suffice, order heel offloading boots (PMM# [id number]).      Nutritional Assessment  Moderate Chronic disease or condition  related malnutrition    The patient has the following conditions and comorbidities:  - Sepsis (present on admission)    - Malignancy associated fatigue on admission  - Neoplasm/malignancy associated pain  - Hypoalbuminemia (present on admission)  - Fluid status: Hypovolemia on admission     - Cachexia  - Encephalopathy: Metabolic encephalopathy   -  Pancytopenia 2/2 chemotherapy    Physical Exam at Discharge  BP 110/76 (BP Location: Right upper arm, Patient Position: Lying)   Pulse 74   Temp 36.6 C (97.9 F) (Oral)   Resp 18   Ht 153 cm (5' 0.24")   Wt 90.1 kg (198 lb 10.2 oz)   LMP  (LMP Unknown)   SpO2 98%   BMI 38.49 kg/m       Intake/Output Summary (Last 24 hours) at 01/10/2022 1449  Last data filed at 01/10/2022 1321  Gross per 24 hour   Intake 100 ml   Output 2175 ml   Net -2075 ml       Physical Exam  Vitals reviewed.   Constitutional:       Appearance: She is ill-appearing.   HENT:      Head: Normocephalic and atraumatic.      Nose: Nose normal.      Mouth/Throat:      Mouth: Mucous membranes are moist.      Pharynx: Oropharynx is clear.   Eyes:      General: Scleral icterus present.      Extraocular Movements: Extraocular movements intact.      Pupils: Pupils are equal, round, and reactive to light.   Cardiovascular:      Rate and Rhythm: Normal rate and regular rhythm.   Pulmonary:      Effort: Pulmonary effort is normal. No respiratory distress.   Abdominal:      General: Abdomen is flat. There is no distension.      Palpations: Abdomen is soft. There is no shifting dullness or fluid wave.      Tenderness: There is abdominal tenderness (mild, diffuse).   Musculoskeletal:      Cervical back: Normal range of motion and neck supple.      Right lower leg: No edema.      Left lower leg: No edema.   Skin:     General: Skin is warm and dry.      Coloration: Skin is jaundiced.   Neurological:      Mental Status: She is alert and oriented to person, place, and time.   Psychiatric:         Mood and Affect:  Mood normal.         Behavior: Behavior is cooperative.         Relevant Labs, Radiology, and Other Studies  Lab Results (last 72 hours)     Procedure Component Value Units Date/Time    Complete Blood Count with Differential [[id number]]  (Abnormal) Collected: 01/10/22 0613    Order Status: Completed Specimen: Whole Blood Updated: 01/10/22 0758     WBC Count 3.2 x10E9/L      RBC Count 2.64 x10E12/L      Hemoglobin 9.2 g/dL      Hematocrit 27.7 %      MCV 105 fL      MCH 34.8 pg      MCHC 33.2 g/dL      Platelet Count 51 x10E9/L      Comment: Repeated        MPV 13.4 fL      RDW-CV Unable to measure %      Abs Neutrophils 2.71 x10E9/L      Abs Lymphocytes 0.21 x10E9/L      Abs Monocytes 0.19 x10E9/L      Abs Eosinophils 0.01 x10E9/L      Abs Basophils 0.00 x10E9/L      Abs Imm Granulocytes  0.05 x10E9/L     Comprehensive Metabolic Panel (BMP, AST, ALT, T.BILI, ALKP, TP, ALB) [[id number]]  (Abnormal) Collected: 01/10/22 0613    Order Status: Completed Specimen: Blood Updated: 01/10/22 0737     Albumin, Serum / Plasma 1.2 g/dL      Alkaline Phosphatase 372 U/L      Alanine transaminase 64 U/L      AST 77 U/L      Bilirubin, Total 11.2 mg/dL      Urea Nitrogen, Serum / Plasma 2 mg/dL      Calcium, total, Serum / Plasma 7.4 mg/dL      Chloride, Serum / Plasma 113 mmol/L      Creatinine 0.33 mg/dL      eGFRcr 118 mL/min/1.15m      Comment: Effective 02/11/2021, eGFRcr calculated using CKD-EPI (2021) equation without race coefficient (NKF-ASN recommendations).        Potassium, Serum / Plasma 3.4 mmol/L      Sodium, Serum / Plasma 142 mmol/L      Protein, Total, Serum / Plasma 3.3 g/dL      Carbon Dioxide, Total 24 mmol/L      Anion Gap 5     Comment: Anion gap is calculated as Na-(Cl+CO2)        Glucose, non-fasting 82 mg/dL     Magnesium, Serum / Plasma [[[id number]]Collected: 01/10/22 0613    Order Status: Completed Specimen: Blood Updated: 01/10/22 0737     Magnesium, Serum / Plasma 1.7 mg/dL     Bilirubin, Direct  [[[id number]] (Abnormal) Collected: 01/10/22 0613    Order Status: Completed Specimen: Blood Updated: 01/10/22 0737     Bilirubin, Direct 8.0 mg/dL     Prothrombin Time [[[id number]] (Abnormal) Collected: 01/10/22 0613    Order Status: Completed Specimen: Blood Updated: 01/10/22 0726     PT 16.4 s      INR 1.4    Fibrinogen, Functional [[[id number]] (Abnormal) Collected: 01/10/22 09798   Order Status: Completed Specimen: Blood Updated: 01/10/22 0726     Fibrinogen, Functional 172 mg/dL     Basic Metabolic Panel (NA, K, CL, CO2, BUN, CR, GLU, CA) [[[id number]] (Abnormal) Collected: 01/09/22 2143    Order Status: Completed Specimen: Blood Updated: 01/09/22 2230     Urea Nitrogen, Serum / Plasma 2 mg/dL      Calcium, total, Serum / Plasma 7.4 mg/dL      Chloride, Serum / Plasma 112 mmol/L      Creatinine 0.33 mg/dL      eGFRcr 118 mL/min/1.779m     Comment: Effective 02/11/2021, eGFRcr calculated using CKD-EPI (2021) equation without race coefficient (NKF-ASN recommendations).        Potassium, Serum / Plasma 3.4 mmol/L      Glucose, non-fasting 82 mg/dL      Sodium, Serum / Plasma 140 mmol/L      Carbon Dioxide, Total 23 mmol/L      Anion Gap 5     Comment: Anion gap is calculated as Na-(Cl+CO2)       Comprehensive Metabolic Panel (BMP, AST, ALT, T.BILI, ALKP, TP, ALB) [[id number]](Abnormal) Collected: 01/09/22 0417    Order Status: Completed Specimen: Blood Updated: 01/09/22 0826     Albumin, Serum / Plasma 1.1 g/dL      Comment: Collection questioned, suggest repeat.        Alkaline Phosphatase 362 U/L      Comment: Collection questioned, suggest repeat.  Alanine transaminase 61 U/L      Comment: Collection questioned, suggest repeat.        AST 72 U/L      Comment: Collection questioned, suggest repeat.        Bilirubin, Total 10.7 mg/dL      Comment: Collection questioned, suggest repeat.        Urea Nitrogen, Serum / Plasma 2 mg/dL      Comment: Collection questioned, suggest repeat.        Calcium, total,  Serum / Plasma 6.9 mg/dL      Comment: Collection questioned, suggest repeat.        Chloride, Serum / Plasma 126 mmol/L      Comment: Collection questioned, suggest repeat.        Creatinine 0.32 mg/dL      Comment: Collection questioned, suggest repeat.        eGFRcr 119 mL/min/1.41m      Comment: Effective 02/11/2021, eGFRcr calculated using CKD-EPI (2021) equation without race coefficient (NKF-ASN recommendations).        Potassium, Serum / Plasma >10.0 mmol/L      Comment: Collection questioned, suggest repeat.  Reported with read back confirmation  TO [place] [id number] [id number] MEBM          Sodium, Serum / Plasma 131 mmol/L      Comment: Collection questioned, suggest repeat.        Protein, Total, Serum / Plasma 3.0 g/dL      Comment: Collection questioned, suggest repeat.        Carbon Dioxide, Total 21 mmol/L      Comment: Collection questioned, suggest repeat.        Anion Gap <1     Glucose, non-fasting 87 mg/dL      Comment: Collection questioned, suggest repeat.       Magnesium, Serum / Plasma [[[id number]]Collected: 01/09/22 0417    Order Status: Completed Specimen: Blood Updated: 01/09/22 0826     Magnesium, Serum / Plasma 1.8 mg/dL      Comment: Collection questioned, suggest repeat.       Bilirubin, Direct [[[id number]] (Abnormal) Collected: 01/09/22 0417    Order Status: Completed Specimen: Blood Updated: 01/09/22 0826     Bilirubin, Direct 7.5 mg/dL      Comment: Collection questioned, suggest repeat.       Basic Metabolic Panel (NA, K, CL, CO2, BUN, CR, GLU, CA) [[[id number]] (Abnormal) Collected: 01/09/22 0651    Order Status: Completed Specimen: Blood Updated: 01/09/22 0815     Urea Nitrogen, Serum / Plasma 2 mg/dL      Calcium, total, Serum / Plasma 7.4 mg/dL      Chloride, Serum / Plasma 113 mmol/L      Creatinine 0.34 mg/dL      eGFRcr 117 mL/min/1.78m     Comment: Effective 02/11/2021, eGFRcr calculated using CKD-EPI (2021) equation without race coefficient (NKF-ASN  recommendations).        Potassium, Serum / Plasma 3.2 mmol/L      Glucose, non-fasting 88 mg/dL      Sodium, Serum / Plasma 140 mmol/L      Carbon Dioxide, Total 23 mmol/L      Anion Gap 4     Comment: Anion gap is calculated as Na-(Cl+CO2)       Complete Blood Count with Differential [[id number]](Abnormal) Collected: 01/09/22 0417    Order Status: Completed Specimen: Whole Blood Updated: 01/09/22 [id number]  WBC Count 3.6 x10E9/L      RBC Count 2.65 x10E12/L      Hemoglobin 9.1 g/dL      Hematocrit 27.7 %      MCV 105 fL      MCH 34.3 pg      MCHC 32.9 g/dL      Platelet Count 57 x10E9/L      Comment: Repeated        MPV 12.2 fL      RDW-CV Unable to measure %      Abs Neutrophils 3.10 x10E9/L      Abs Lymphocytes 0.20 x10E9/L      Abs Monocytes 0.22 x10E9/L      Abs Eosinophils 0.01 x10E9/L      Abs Basophils 0.01 x10E9/L      Abs Imm Granulocytes 0.05 x10E9/L     Prothrombin Time [[id number]]  (Abnormal) Collected: 01/09/22 0417    Order Status: Completed Specimen: Blood Updated: 01/09/22 0618     PT 16.6 s      INR 1.4    Fibrinogen, Functional [[id number]]  (Abnormal) Collected: 01/09/22 0417    Order Status: Completed Specimen: Blood Updated: 01/09/22 0618     Fibrinogen, Functional 155 mg/dL     Basic Metabolic Panel (NA, K, CL, CO2, BUN, CR, GLU, CA) [[id number]]  (Abnormal) Collected: 01/08/22 1557    Order Status: Completed Specimen: Blood Updated: 01/08/22 1719     Urea Nitrogen, Serum / Plasma 2 mg/dL      Calcium, total, Serum / Plasma 7.4 mg/dL      Chloride, Serum / Plasma 113 mmol/L      Creatinine 0.31 mg/dL      eGFRcr 120 mL/min/1.72m      Comment: Effective 02/11/2021, eGFRcr calculated using CKD-EPI (2021) equation without race coefficient (NKF-ASN recommendations).        Potassium, Serum / Plasma 3.3 mmol/L      Glucose, non-fasting 87 mg/dL      Sodium, Serum / Plasma 141 mmol/L      Carbon Dioxide, Total 24 mmol/L      Anion Gap 4     Comment: Anion gap is calculated as Na-(Cl+CO2)        Comprehensive Metabolic Panel (BMP, AST, ALT, T.BILI, ALKP, TP, ALB) [[[id number]] (Abnormal) Collected: 01/08/22 0323    Order Status: Completed Specimen: Blood Updated: 01/08/22 0457     Albumin, Serum / Plasma 1.2 g/dL      Alkaline Phosphatase 418 U/L      Alanine transaminase 64 U/L      AST 77 U/L      Bilirubin, Total 11.6 mg/dL      Urea Nitrogen, Serum / Plasma 2 mg/dL      Calcium, total, Serum / Plasma 7.5 mg/dL      Chloride, Serum / Plasma 114 mmol/L      Creatinine 0.34 mg/dL      eGFRcr 117 mL/min/1.767m     Comment: Effective 02/11/2021, eGFRcr calculated using CKD-EPI (2021) equation without race coefficient (NKF-ASN recommendations).        Potassium, Serum / Plasma 3.3 mmol/L      Sodium, Serum / Plasma 141 mmol/L      Protein, Total, Serum / Plasma 3.3 g/dL      Carbon Dioxide, Total 24 mmol/L      Anion Gap 3     Comment: Repeated  Anion gap is calculated as Na-(Cl+CO2)          Glucose,  non-fasting 80 mg/dL     Magnesium, Serum / Plasma [[id number]] Collected: 01/08/22 0323    Order Status: Completed Specimen: Blood Updated: 01/08/22 0457     Magnesium, Serum / Plasma 1.8 mg/dL     Bilirubin, Direct [[id number]]  (Abnormal) Collected: 01/08/22 0323    Order Status: Completed Specimen: Blood Updated: 01/08/22 0457     Bilirubin, Direct 8.1 mg/dL     Prothrombin Time [[id number]]  (Abnormal) Collected: 01/08/22 0323    Order Status: Completed Specimen: Blood Updated: 01/08/22 0434     PT 16.7 s      INR 1.4    Fibrinogen, Functional [[id number]]  (Abnormal) Collected: 01/08/22 0323    Order Status: Completed Specimen: Blood Updated: 01/08/22 0434     Fibrinogen, Functional 174 mg/dL     Complete Blood Count with Differential [[id number]]  (Abnormal) Collected: 01/08/22 0323    Order Status: Completed Specimen: Whole Blood Updated: 01/08/22 0432     WBC Count 4.1 x10E9/L      RBC Count 2.79 x10E12/L      Hemoglobin 9.5 g/dL      Hematocrit 28.3 %      MCV 101 fL      MCH 34.1 pg      MCHC 33.6 g/dL       Platelet Count 61 x10E9/L      MPV 12.4 fL      RDW-CV Unable to measure %      Abs Neutrophils 3.60 x10E9/L      Abs Lymphocytes 0.26 x10E9/L      Abs Monocytes 0.16 x10E9/L      Abs Eosinophils 0.01 x10E9/L      Abs Basophils 0.01 x10E9/L      Abs Imm Granulocytes 0.06 x10E9/L     Basic Metabolic Panel (NA, K, CL, CO2, BUN, CR, GLU, CA) [[id number]]  (Abnormal) Collected: 01/07/22 1600    Order Status: Completed Specimen: Blood Updated: 01/07/22 1845     Urea Nitrogen, Serum / Plasma 3 mg/dL      Calcium, total, Serum / Plasma 7.6 mg/dL      Chloride, Serum / Plasma 114 mmol/L      Creatinine 0.29 mg/dL      eGFRcr 122 mL/min/1.23m      Comment: Effective 02/11/2021, eGFRcr calculated using CKD-EPI (2021) equation without race coefficient (NKF-ASN recommendations).        Potassium, Serum / Plasma 4.3 mmol/L      Glucose, non-fasting 87 mg/dL      Sodium, Serum / Plasma 140 mmol/L      Carbon Dioxide, Total 22 mmol/L      Anion Gap 4     Comment: Anion gap is calculated as Na-(Cl+CO2)             Procedures Performed and Complications  10/94PICC  1/27 IR liver biopsy  2/1 LP    Nutritional Assessment  Moderate Chronic disease or condition related malnutrition    During this hospital stay, the patient was treated for the following conditions:   - Sepsis (present on admission)    - Malignancy associated fatigue on admission  - Neoplasm/malignancy associated pain  - Hypoalbuminemia (present on admission)  - Fluid status: Hypovolemia on admission     - Cachexia  - Encephalopathy: Metabolic encephalopathy   - Pancytopenia 2/2 chemotherapy    I spent 45 minutes preparing discharge materials, prescriptions, follow up plans, and face-to-face time with the patient/family discussing inpatient findings/plans.    DISCHARGE INSTRUCTIONS  Discharge Diet  Regular Diet    PHYSICAL THERAPY ASSESSMENTS AND RECOMMENDATIONS AT DISCHARGE   Available equipment or existing home modifications:  No DME.  Prior functional limitations:   Independent without AD.  Rehab potential:  Patient participates well in therapy and progressing towards goals  Discharge Activity comments:  Bed in chair position, OOB to chair with ceiling lift    Functional Assessment at Discharge/Activity Goals  Patient Discharge Activity     Mobilize with assistance   As directed      Instructions: Prior Level of Function:    Prior Living Environment Comments: Pt lives in [facility] with children in [place]. Bedroom on 2nd story. Has stairs to enter.  Available equipment or existing home modifications: No DME.  Prior functional limitations (device for gait, ADL, IADL, vision, cognition) : Independent without AD.    Nursing Recommendations:    Rehab Assistive Device Recommendation: Vertical dependent lift, Ceiling lift  Activity recommendations comments: Bed in chair position, OOB to chair with ceiling lift      Up to chair for all meals; unless on bedrest; 3 Times Daily   As directed            Allergies and Medications at Discharge    Allergies: Diphenoxylate-atropine and Caffeine    Your Medications at the End of This Hospitalization             0.9% sodium chloride flush syringe Inject 3 mL into the vein 2 (two) times daily as needed (line flush after each use)    acyclovir (ZOVIRAX) 400 mg tablet Take 1 tablet (400 mg total) by mouth Twice a day    albuterol (PROVENTIL) 2.5 mg /3 mL (0.083 %) inhalation solution Use 3 mL (2.5 mg total) by nebulization every 4 (four) hours as needed (wheezing/shortness of breath)    aluminum-magnesium hydroxide-simethicone (MAALOX PLUS) 200-200-20 mg/5 mL suspension Take 30 mL by mouth every 8 (eight) hours as needed for Indigestion (heartburn, bloating (1st))    cholecalciferol, vitamin D3, 1000 UNITS tablet Take 4 tablets (4,000 Units total) by mouth daily    clotrimazole (LOTRIMIN) 1 % cream Apply topically Twice a day Thighs and groin    dextrose 5 % and 0.9 % sodium chloride infusion Inject 75 mL/hr into the vein Continuous    dicyclomine  (BENTYL) 10 mg capsule Take 2 capsules (20 mg total) by mouth in the morning and 2 capsules (20 mg total) at noon and 2 capsules (20 mg total) in the evening and 2 capsules (20 mg total) before bedtime.    dry mouth oral rinse (BIOTENE DRY MOUTH) solution 5 mL by Mucous Membrane route in the morning and 5 mL at noon and 5 mL in the evening and 5 mL before bedtime.    HYDROmorphone (DILAUDID) 0.5 mg/0.5 mL injection Inject 0.3-0.8 mL (0.3-0.8 mg total) into the vein every 4 (four) hours as needed (first line pain)    hydrOXYzine (ATARAX) 10 mg tablet Take 1 tablet (10 mg total) by mouth every 8 (eight) hours as needed for Anxiety    loperamide (IMODIUM A-D) 2 mg tablet Take 2 tablets (4 mg total) by mouth 4 (four) times daily as needed for Diarrhea    melatonin 3 mg tablet Take 2 tablets (6 mg total) by mouth nightly at bedtime    multivitamin complete chewable (FLINTSTONE'S COMPLETE) chewable tablet Chew 1 tablet by mouth daily    ondansetron (ZOFRAN) 4 mg/2 mL injection Inject 4 mL (8 mg total) into  the vein every 8 (eight) hours as needed (nausea/vomiting)    rifAXIMin (XIFAXAN) 550 mg tablet Take 1 tablet (550 mg total) by mouth 2 (two) times daily    simethicone (MYLICON) 80 mg chewable tablet Chew 1 tablet (80 mg total) by mouth 2 (two) times daily as needed (Bloating alone)    thiamine (VITAMIN B1) 100 mg/mL injection Inject 2 mL (200 mg total) into the vein Once a day    ursodiol (ACTIGALL) 60 mg/mL syrup Take 10 mL (600 mg total) by mouth 2 (two) times daily    valGANciclovir (VALCYTE) 450 mg tablet Take 1 tablet (450 mg total) by mouth 2 (two) times daily    vitamin E, dl,tocopheryl acet, (VITAMIN E, DL, ACETATE,) 45 mg (100 unit) capsule Take 8 capsules (800 Units total) by mouth daily          Discharge With Opioid Medications for:  ACUTE PAIN  According to our records, this patient was not taking opioid medications before this hospitalization.  We do not plan to manage refill and the Primary Care  Provider should manage refills.  Please evaluate with each refill of an opioid that your patient meets the criteria for ongoing opioid therapy with the goal to avoid chronic opioid use. Please ensure the patient knows about alternatives for pain management and understands how to taper opioids.    CURES database was reviewed and consistent with expected prescribing.        Pending Tests   None    Outside Follow-up   Other Patient Follow-up     [facility] [place] Follow up.    Contact information  [place] [address]  [phone]                        Booked [place] Appointments  No future appointments.    Pending [place] Referrals  None    Case Management Services Arranged  Case Management Services Arranged: (all recorded)     Outside Services Arranged For [place] Name         [facility]ACH/Hospital    Name of Agency/Facility --    Address Type (copy to patient address) --    Street address --    [place] --    Zip Code --    Phone number --    Fax number --    Authorization # --    MD to MD discussion completed --    RN to RN report phone number --    Start Date for Services --    Additional Instructions --                Discharge Assessment  Condition at discharge:  good  Final Discharge Disposition: Other [place]     Name Date    Pfizer Sars-Cov-2 Vaccine ([place]) 08/14/2020    Manufacturer: Unknown Manufacturer    Lot: [id number]    External: Auto Reconciled From Outside Source    Pfizer Sars-Cov-2 Vaccine ([place]) 03/18/2020    Manufacturer: Unknown Manufacturer    Lot: [id number]    External: Auto Reconciled From Outside [place] Documentation during this hospitalization:    Code Status: FULL    Last [place] (Valid for this  hospitalization only)     None        Advance directive, POLST, or Living Will Documents -- Patient Level:    Advance directive, POLST, or Living  Will Documents: None found at the patient level.           Advance Care Planning Documentation       Primary Care Physician  Eldridge Scot  Address: [address] [place] [address]   Phone: [phone]  Fax: [phone]     I spent 45 minutes preparing discharge materials, prescriptions, follow up plans, and face-to-face time with the patient/family.    Outside Providers, for pending tests please use the following numbers:   For [facility] - Please Call: [phone]    For [facility] - Please Call: [phone]   For [facility] - Please Call: [phone]    Signed,  [name], MD  01/10/22        Discharge Instructions provided to the patient (if any):    Discharge Instructions    Call clinic immediately for the following: [phone])  Fever greater than 38o C or 100.4o F and/or shaking chills   Shortness of breath   Severe cough   Bleeding that does not stop on its own  Nausea/vomiting that does not get better with nausea medications  Inability to take oral medications  3 or more loose or liquid stools per day  If you have fallen *(see below)  Concerns about your central line    Do NOT use My Chart to communicate any of these symptoms.    ALWAYS CALL 911 for emergencies, such as:  New or increase in chest pain  New or increase in shortness of breath  Changes in mental status, speech, ability to walk  *If you have fallen and hit your head, are bleeding, unable to walk or move extremities, or are in severe pain.    Do NOT use My Chart to communicate any of these symptoms.    General tips:   A balance of activities is important. Be sure to gradually increase your exercise, but avoid overexertion & exhaustion.  Hand-washing is important. Be sure that you and the others you live with wash their hands frequently and always before preparing food or after using the bathroom.  You can resume a regular diet unless instructed otherwise by your doctor or  nurse practitioner.  Please arrive 1 hour before your scheduled MD, NP or PIC ([facility]) appointment to have your labs drawn, unless you are told otherwise. This allows time for labs results to come in prior to your appointment.   Prescription refills can take time to process. Please request refills before discharge from the hospital, or from the clinic at least 1 week before you run out of your medication.  You should have your follow up visit scheduled within 24 hours (1 business day) of being discharged from the hospital. If you do not hear from our discharge coordinator within this time, please call our main line ([phone]), press #1, then #3 for an MD or NP appointment or #4 for a PIC (infusion) appointment. You can ask to speak with the discharge coordinator when you reach a live person.           Patient Instructions    None

## 2022-01-11 LAB — COMPLETE BLOOD COUNT WITH DIFFERENTIAL
Abs Basophils: 0 10*9/L (ref 0.00–0.10)
Abs Eosinophils: 0 10*9/L (ref 0.00–0.40)
Abs Imm Granulocytes: 0.07 10*9/L (ref <0.10)
Abs Lymphocytes: 0.12 10*9/L — ABNORMAL LOW (ref 1.00–3.40)
Abs Monocytes: 0.33 10*9/L (ref 0.20–0.80)
Abs Neutrophils: 3.94 10*9/L (ref 1.80–6.80)
Hematocrit: 26.1 % — ABNORMAL LOW (ref 36.0–46.0)
Hemoglobin: 8.6 g/dL — ABNORMAL LOW (ref 12.0–15.5)
MCH: 35.4 pg — ABNORMAL HIGH (ref 26.0–34.0)
MCHC: 33 g/dL (ref 31.0–36.0)
MCV: 107 fL — ABNORMAL HIGH (ref 80–100)
MPV: 13.4 fL — ABNORMAL HIGH (ref 9.1–12.6)
Platelet Count: 45 10*9/L — ABNORMAL LOW (ref 140–450)
RBC Count: 2.43 10*12/L — ABNORMAL LOW (ref 4.00–5.20)
RDW-CV: UNDETERMINED % (ref 11.7–14.4)
WBC Count: 4.5 10*9/L (ref 3.4–10.0)

## 2022-01-11 LAB — TYPE AND SCREEN
ABO/RH(D): O POS
Antibody Screen: NEGATIVE

## 2022-01-11 LAB — PROTHROMBIN TIME
INR: 1.5 — ABNORMAL HIGH (ref 0.9–1.2)
PT: 17 s — ABNORMAL HIGH (ref 11.6–15.0)

## 2022-01-11 LAB — COMPREHENSIVE METABOLIC PANEL (BMP, AST, ALT, T.BILI, ALKP, TP ALB)
AST: 78 U/L — ABNORMAL HIGH (ref 5–44)
Alanine transaminase: 63 U/L — ABNORMAL HIGH (ref 10–61)
Albumin, Serum / Plasma: 1.1 g/dL — ABNORMAL LOW (ref 3.4–4.8)
Alkaline Phosphatase: 334 U/L — ABNORMAL HIGH (ref 38–108)
Anion Gap: 3 — ABNORMAL LOW (ref 4–14)
Bilirubin, Total: 10.5 mg/dL — ABNORMAL HIGH (ref 0.2–1.2)
Calcium, total, Serum / Plasma: 7.4 mg/dL — ABNORMAL LOW (ref 8.4–10.5)
Carbon Dioxide, Total: 24 mmol/L (ref 22–29)
Chloride, Serum / Plasma: 114 mmol/L — ABNORMAL HIGH (ref 101–110)
Creatinine: 0.37 mg/dL — ABNORMAL LOW (ref 0.55–1.02)
Glucose, non-fasting: 116 mg/dL (ref 70–199)
Potassium, Serum / Plasma: 3.7 mmol/L (ref 3.5–5.0)
Protein, Total, Serum / Plasma: 3.1 g/dL — ABNORMAL LOW (ref 6.3–8.6)
Sodium, Serum / Plasma: 141 mmol/L (ref 135–145)
Urea Nitrogen, Serum / Plasma: 3 mg/dL — ABNORMAL LOW (ref 7–25)
eGFRcr: 115 mL/min/{1.73_m2} (ref >59)

## 2022-01-11 LAB — BASIC METABOLIC PANEL (NA, K, CL, CO2, BUN, CR, GLU, CA)
Anion Gap: 4 (ref 4–14)
Calcium, total, Serum / Plasma: 7.6 mg/dL — ABNORMAL LOW (ref 8.4–10.5)
Carbon Dioxide, Total: 24 mmol/L (ref 22–29)
Chloride, Serum / Plasma: 114 mmol/L — ABNORMAL HIGH (ref 101–110)
Creatinine: 0.33 mg/dL — ABNORMAL LOW (ref 0.55–1.02)
Glucose, non-fasting: 119 mg/dL (ref 70–199)
Potassium, Serum / Plasma: 3.5 mmol/L (ref 3.5–5.0)
Sodium, Serum / Plasma: 142 mmol/L (ref 135–145)
Urea Nitrogen, Serum / Plasma: 2 mg/dL — ABNORMAL LOW (ref 7–25)
eGFRcr: 118 mL/min/{1.73_m2} (ref >59)

## 2022-01-11 LAB — MAGNESIUM, SERUM / PLASMA: Magnesium, Serum / Plasma: 2.1 mg/dL (ref 1.6–2.6)

## 2022-01-11 LAB — FIBRINOGEN, FUNCTIONAL: Fibrinogen, Functional: 173 mg/dL — ABNORMAL LOW (ref 202–430)

## 2022-01-11 LAB — BILIRUBIN, DIRECT: Bilirubin, Direct: 7.6 mg/dL — ABNORMAL HIGH (ref <0.6)

## 2022-01-11 MED ORDER — POTASSIUM CHLORIDE 20 MEQ/50 ML IN STERILE WATER INTRAVENOUS PIGGYBACK
20 | Freq: Once | INTRAVENOUS | Status: AC
Start: 2022-01-11 — End: 2022-01-11
  Administered 2022-01-12: 04:00:00 via INTRAVENOUS

## 2022-01-11 MED ORDER — POTASSIUM CHLORIDE 20 MEQ/50 ML IN STERILE WATER INTRAVENOUS PIGGYBACK
20 | Freq: Once | INTRAVENOUS | Status: AC
Start: 2022-01-11 — End: 2022-01-11
  Administered 2022-01-12: 05:00:00 via INTRAVENOUS

## 2022-01-11 MED FILL — URSODIOL (BULK) 100 % POWDER: 100 % | Qty: 0.6

## 2022-01-11 MED FILL — XIFAXAN 550 MG TABLET: 550 mg | ORAL | Qty: 1

## 2022-01-11 MED FILL — VALGANCICLOVIR 450 MG TABLET: 450 mg | ORAL | Qty: 1

## 2022-01-11 MED FILL — POTASSIUM CHLORIDE 20 MEQ/50 ML IN STERILE WATER INTRAVENOUS PIGGYBACK: 20 mEq/50 mL | INTRAVENOUS | Qty: 50

## 2022-01-11 MED FILL — MELATONIN 1 MG TABLET: 1 mg | ORAL | Qty: 1

## 2022-01-11 MED FILL — VITAMIN E (DL, ACETATE) 45 MG (100 UNIT) CAPSULE: 45 mg (100 unit) | ORAL | Qty: 8

## 2022-01-11 MED FILL — DILAUDID (PF) 0.5 MG/0.5 ML INJECTION SYRINGE: 0.5 mg/ mL | INTRAMUSCULAR | Qty: 0.5

## 2022-01-11 MED FILL — DICYCLOMINE 10 MG CAPSULE: 10 mg | ORAL | Qty: 2

## 2022-01-11 MED FILL — THIAMINE HCL (VITAMIN B1) 100 MG/ML INJECTION SOLUTION: 100 mg/mL | INTRAMUSCULAR | Qty: 2

## 2022-01-11 MED FILL — MAG-AL PLUS 200 MG-200 MG-20 MG/5 ML ORAL SUSPENSION: 200-200-20 mg/5 mL | ORAL | Qty: 30

## 2022-01-11 MED FILL — HYDROXYZINE HCL 10 MG TABLET: 10 mg | ORAL | Qty: 1

## 2022-01-11 MED FILL — URSODIOL 60 MG/ML ORAL SYRUP: 60 mg/mL | ORAL | Qty: 10

## 2022-01-11 MED FILL — CHOLECALCIFEROL (VITAMIN D3) 25 MCG (1,000 UNIT) TABLET: 1000 UNITS | ORAL | Qty: 4

## 2022-01-11 MED FILL — FLINTSTONES COMPLETE (IRON) CHEWABLE TABLET: ORAL | Qty: 1

## 2022-01-11 NOTE — Consults (Signed)
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 The patient has received VRd X 4 (achieving VGPR) then Mel 200 ASCT 05/12/11. 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 She met the criteria for PD on 02/19/2020 and was started on third line DARA PD in June 2021, which was recently held on 10/20/21 due to chronic diarrhea. Patient was transferred OSH after recent hypovolemic/distributive shock. Here for acute encephalopathy workup and acute liver failure workup.    Assessment and Treatment Plan    OT Progress summary: Pt continues to require increased time, education and setup for any activity. Pt tolerated transfer to chair well with hoyer lift and reported comfort once positioned in chair with pillows and posey belt. Pt consisitently p/w decreased insight regarding impairments, need for mobility and risk of further medical conditions due to increased bedrest. Pt unable to identify goals or concerns about inability to mobility without hoyer lift. Pt stated she was content with family visiting in the hospital in her bed. Pt p/w fair LE strength and could make progress if pt was more agreeable to participate  and engage in strengthening. Pt requires dependent assist for ADLs. Recommend placement.  Progress with OT: No progress  Current maximal level of assist: Dependent assist  Next session focus:      Recommendations    Recommended Discharge Disposition Placement for continued therapy   Discharge DME Recommendations Defer to next level of care   Equipment Recommendations     DME Comment     Discharge Recommendations Comment     Rehab Potential Patient participates well in therapy and is progressing towards goal   Anticipated Assistance Available at Discharge Family;Spouse/Significant other   Anticipated Type of Assistance Available at Discharge Assist as needed   Anticipated Time of  Assistance Available at Discharge Unknown at this time   Barriers to Discharge Medical issues;Impaired Cognition;Insufficient activity tolerance;Behavioral issues;Insufficient physical ability   Patient's current functional ability appropriate for D/C recommendations Yes     Inpatient Recommendations  OT Inpatient Recommendations     Recommendation Comments Sling to chair and 2p assist   Current Maximal Level of Assist Needed Dependent assist     Brace/Precautions   Brace/Orthotic/Prosthetic:   Precautions:      Subjective Report:"I have done enough for you."    Patient/Family Goal:     Objective Findings and Interventions    Areas of Occupation    Grooming and Light Hygiene Supervision/safety;Set up   Cueing Required     Comment wash face, min cueing to perform   Self Feeding     Cueing Required     Comment     Toileting Maximal assistance   Cueing Required     Toileting Clothing Management Maximal assistance   Cueing Required     Comment max A rolling and bed pan placement, unable to have BM   Upper Body Dressing Contact guard assistance   Cueing Required     Comment don jacket in front   Lower Body Dressing     Cueing Required     Comment     Upper Body Bathing     Cueing Required     Comment     Lower Body Bathing     Cueing Required     Comment     ADL/IADL Comment  Functional Transfers    Bed Mobility From Supine   Bed Mobility To Sidelying   Level of Assist Maximal assistance   Cueing Required     Transfer From Bed   Transfer To Chair   Level of Assist Dependent   Cueing Required     Technique     Functional Transfer Comment Bilateral LE supine therex warm up. Max A toileting. Pt transferred to chair via hoyer and 2p assist. Provided increased education regarding mobility as part of pt treatment plan and essential to reduce secondary conditions. Pt nodded yes to therapist for tomorrow session once in chair.         [place] for ADLs  6 Click Score: 11    Client Factors  Cognitive  deficits noted in the following area(s):: Attention, Memory, Judgement/Safety  Cognition comment:     Initiation;Organization;Sequencing;Problem ID;Problem solving;Information processing;Safety awareness  Executive functions comment:                                                     OT Education       OT Plan of [place], OT  01/11/2022 2:27 PM

## 2022-01-11 NOTE — Progress Notes (Signed)
[facility] PROGRESS NOTE  ATTENDING ONLY     My date of service is 01/11/22.    24 Hour Course  NAEON. No beds available at [place]  No complaint this morning    Vitals  Temp:  [36.5 C (97.7 F)-37 C (98.6 F)] 36.9 C (98.4 F)  Heart Rate:  [78-95] 85  *Resp:  [16-18] 18  BP: (104-130)/(70-88) 130/88  SpO2:  [95 %-98 %] 98 %    MostRecent Weight: 93 kg (205 lb 0.4 oz)  Admission Weight: 79.6 kg (175 lb 7.8 oz)      Intake/Output Summary (Last 24 hours) at 01/11/2022 1603  Last data filed at 01/11/2022 0451  Gross per 24 hour   Intake 2090 ml   Output 350 ml   Net 1740 ml     Pain Score: 0    Physical Exam  Vitals reviewed.   Constitutional:       Appearance: She is ill-appearing.   HENT:      Head: Normocephalic and atraumatic.      Nose: Nose normal.      Mouth/Throat:      Mouth: Mucous membranes are moist.      Pharynx: Oropharynx is clear.   Eyes:      General: Scleral icterus present.      Extraocular Movements: Extraocular movements intact.      Pupils: Pupils are equal, round, and reactive to light.   Cardiovascular:      Rate and Rhythm: Normal rate and regular rhythm.   Pulmonary:      Effort: Pulmonary effort is normal. No respiratory distress.   Abdominal:      General: Abdomen is flat. There is no distension.      Palpations: Abdomen is soft. There is no shifting dullness or fluid wave.      Tenderness: There is no abdominal tenderness.   Musculoskeletal:      Cervical back: Normal range of motion and neck supple.      Right lower leg: No edema.      Left lower leg: No edema.   Skin:     General: Skin is warm and dry.      Coloration: Skin is jaundiced.   Neurological:      Mental Status: She is alert and oriented to person, place, and time.   Psychiatric:         Mood and Affect: Mood normal.         Behavior: Behavior is cooperative.     Scheduled Meds:   0.9% sodium chloride flush  3 mL Intravenous Q12H [place]    cholecalciferol (vitamin D3)  4,000 Units Oral  Daily [place]    clotrimazole   Topical BID SCH    dicyclomine  20 mg Oral 4x Daily [place]    dry mouth oral rinse  5 mL Mucous Membrane 4x Daily [place]    melatonin  6 mg Oral Daily At Bedtime [facility]    multivitamin complete chewable  1 tablet Oral Daily [place]    QUEtiapine  50 mg Oral Once    rifAXIMin  550 mg Oral BID [place]    thiamine  200 mg Intravenous Q24H    ursodiol  600 mg Oral BID [facility]    valGANciclovir  450 mg Oral BID [place]    vitamin E (dl, acetate)  800 Units Oral Daily [place]     Continuous Infusions:   dextrose 5 % and 0.9 %  sodium chloride 75 mL/hr (01/10/22 1800)     PRN Meds:   0.9% sodium chloride flush  3 mL Intravenous PRN    albuterol  2.5 mg Nebulization Q4H PRN    Or    albuterol  2.5 mg Nebulization Q4H PRN    aluminum-magnesium hydroxide-simethicone  30 mL Oral Q8H PRN    HYDROmorphone  0.3-0.8 mg Intravenous Q4H PRN    hydrOXYzine  10 mg Oral Q8H PRN    loperamide  4 mg Oral 4x Daily PRN    magnesium sulfate in dextrose 5 %  2-4 g Intravenous Daily PRN    Or    magnesium sulfate in water  2-4 g Intravenous Daily PRN    ondansetron  8 mg Intravenous Q8H PRN    polyethylene glycol  17 g Oral Daily PRN    potassium chloride in sterile water  20-80 mEq Intravenous Daily PRN    Or    potassium chloride in sterile water  20-80 mEq Intravenous Daily PRN    Or    potassium chloride  20-80 mEq Oral Daily PRN    simethicone  80 mg Oral BID PRN     Data    CBC        01/11/22  0441 01/10/22  0613   WBC 4.5 3.2*   HGB 8.6* 9.2*   HCT 26.1* 27.7*   PLT 45* 51*     Coags        01/11/22  0441 01/10/22  0613   INR 1.5* 1.4*     Chem7        01/11/22  0441 01/10/22  1608 01/10/22  0613   NA 141 142 142   K 3.7 3.5 3.4*   CL 114* 114* 113*   CO2 _0 BUN 3* 2* 2*   CREAT 0.37* 0.33* 0.33*   GLU 116 119 82     Electrolytes        01/11/22  0441 01/10/22  1608 01/10/22  0613   CA 7.4* 7.6* 7.4*   MG 2.1  --  1.7     Liver Panel        01/11/22  0441 01/10/22  0613   AST 78* 77*   ALT 63* 64*    ALKP 334* 372*   TBILI 10.5* 11.2*   TP 3.1* 3.3*   ALB 1.1* 1.2*     Lactate  No results found in last 36 hours    Microbiology Results (last 24 hours)     Procedure Component Value Units Date/Time    COVID-19 RNA, RT-PCR/Nucleic Acid Amplification RETEST (Asymptomatic and No Specific COVID-19 Suspicion); No; No; No [[id number]]     Order Status: Sent Specimen: Not Applicable from Bilateral Anterior Nares +/- OP Swab         Radiology Results  No results found.     I discussed the patient with Melanee Left, MD from [facility] regarding A&P.    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 The patient has received VRd X 4 (achieving VGPR) then Mel 200 ASCT 05/12/11. 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 She met the criteria for PD on 02/19/2020 and was started on third line DARA PD in June 2021, which was recently held on 10/20/21 due to chronic diarrhea. Patient was transferred OSH after recent hypovolemic/distributive shock. Here for acute encephalopathy  workup and acute liver failure workup.      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   June 2020 due to progression of M spike Revlimid was increased to 15 mg PO D1-21 every 28 days, plus dexamethasone  July 2021: started daratumumab, Pomylast 2 mg D 1-21, Q28 days, dexamethasone 12 mg PO Q weekly.   Primary oncologist: Dr. Iona Coach,  Dr. Kennon Rounds [place] hematology  Receiving dexamethasone 12 mg weekly for diarrhea  Receiving MT with daratumumab with pomalyst and dex, most recent dose 10/16/21, has since been on hold due to worsening diarrhea.     DATA:   09/07/2010: IgG Kappa MM  12/02/2021: Ig M <5, IgG 447, IgA 20, Ig E <2, SFLC Kappa 16.6, Lambda 1.6, Kappa/Lamda  10.38  12/14/21: IgG K on IFE, IgG 430, KLC 16.6, LLC 2.7, KLR 6.15    Chemo:   Holding Daratumumab and Pomalyst, last dose 10/20/21, given recent infection and acute liver failure.    Supportive Care:   Transfusion: Keep Hg >7, and Platelets >10.   Line: PICC placed 1/26    Outpatient Oncologist: Dr. Kennon Rounds    Dispo: Given improvement in diarrhea and slowly improving Tbili (will take weeks to months per hepatology), plan to transfer back to prior OSH once bed available    # Immunocompromised  # CMV colitis  # At risk for infections due to malignancy  # Shock, likely hypovolemic vs distributive in the setting of chronic diarrhea, + norovirus infection   # Acute on chronic diarrhea, norovirus positive., diff and OP negative.   -No prior evidence of adrenal insufficiency, AM cortisol levels normal, previously on dexamethasone 8 mg PO Qweekly, but has been held since early December.    Data:  - CT brain: No acute intracranial hemorrhage, herniation, or hydrocephalus.  - CT chest/A/P pending: Medium-sized bilateral pleural effusions with bilateral peribronchovascular groundglass opacities, suggestive of pulmonary edema. Clustered nodules in right middle lobe along the right minor fissure, which could represent mild aspiration or infection. Numerous sclerotic and mixed lytic/sclerotic lesions throughout the thoracic spine and bilateral ribs, increased in size and number compared to prior. Appearance is atypical for multiple myeloma but may represent a combination of treated, partially treated, and active disease. No acute pathologic fracture. Findings compatible with enterocolitis of infectious or inflammatory etiology. Involvement of the colon is greater than of the small bowel. Marked hepatic steatosis. Increased sclerosis of bone lesions throughout the thoracolumbar spine, bony pelvis, and proximal left femur. Appearance is atypical for multiple myeloma but may represent a combination of treated, partially  treated, and active disease. No acute pathologic fracture.  - 1/29:CMV PCR [id number]  - 1/31: GI PCR panel positive only for Norovirus.   2/1: LP results: Neutrophils 6% in tube 1, Lymphocytes 88% (predominant), protein 42 (normal), glucose 36 (low), rapid HSV in CSF negative. CMV CSF PCR negative    - 2/2 CMV PCR 179,555High   - 2/2 Flex Sig Biopsies prelim: "colonic mucosa with a rare small non-necrotizing mucosal granuloma and rare CMV immunolabeling"   - 2/5 [place] work up: sIL2 (1556.6 high), ferritin (948 high), TG(258 high), NKA-IFN? 10 (WNL)  - 2/8: CMV PCR: 3107 (sig improvement)  - 2/10: C diff: negative.  - 2/12: Repeat GI viral PCR: positive for  Norovirus  - 2/15 CMV 755  - 2/17 IgG 745  - 2/22: Repeat GI viral PCR positive for Norovirus    - 2/22 CMV 74    Plan:  - appreciate ID recs  -- Ganciclovir 5 mg/kg (1/31-2/21). S/p one dose of IVIG on 2/7.   -- S/p Valcyte 900 mg BID (2/21 -> 2/23) -> transitioned to maintenance Valcyte 456m BID   -- S/p Alinia (nitazoxanide) for 7 days (2/16 - 2/22) for norovirus treatment. Repeat GI viral PCR was persistently positive for Norovirus on 2/22, but ID said it is likely from colonization. No need to treat further  -- s/p IV Zosyn 4.5 g Q8hrs (1/26-1/28, restarted 1/29 - 2/1 for recurrent fever)  -- GI consult: appreciate recs: flex sig 2/2  -- Weekly CMV  -- Rifaximin for hepatic encephalopathy and SIBO as below     # Acute liver injury of unclear etiology  # Acute on chronic encephalopathy likely 2/2 infectious vs hepatic vs metabolic  # Hyperbilirubinemia  # Anasarca  # Hypoalbuminemia    Data:   -Steady rise in LFTs in 05/2021,  -12/02/21: AST 152--> 121, ALT 130--> 110, TB 5.4, Albumin 1.1  -Hepatitis panel negative.   -1/26:  Tbili 14.6, GGT 1160, ammonia 25  -CTAP 12/02/21: Scattered sclerotic lesions in the lumbar spine, hepatic steatosis.   -RUQ ultrasound: liver enlarged with diffuse increased echogenicity, consistent with fatty infiltration. 2cm stone  in the gallbladder.   -HIDA scan: Normal radiotracer filling of the gallbladder is identified, therefore the exam is not consistent with cystic duct obstruction or acute cholecystitis.  - [place] 1/27: Hepatomegaly with diffusely increased echogenicity which can be seen with hepatic amyloidosis or steatosis. Please refer to the recently performed biopsy for further details. Mild perihepatic ascites. Patent hepatic vasculature with normal waveforms. Cholelithiasis without acute cholecystitis. Right pleural effusion.  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 CMV stains: Negative.  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 Could be drug related. Increase Ursodiol to 600 mg BID, Check Hepatitis E. 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 Unlikely diarrhea from Ursodiol.  - Avoiding steroids  - Daily LFT/INR  - RD consult placed to expand diet to more than protein shakes.  -- Family requesting appetite stimulant but risk > benefit.     # Acute toxic encephalopathy, improving  In the setting of liver failure, ammonia only 25. LP performed 2/1 to r/o encephalitis.     Data:   1/31: HIV, TSH, Folate, B12 WNL  1/30: CT head WNL other than sinus disease  2/1: LP results: Neutrophils 6% in tube 1,  Lymphocytes 88% (predominant), protein 42 (normal), glucose 36 (low), rapid HSV in CSF negative. CMV CSF PCR negative  2/5: Brain MRI: Smooth diffuse dural thickening and enhancement as well as bilateral holohemispheric  subdural effusions, which may be on the basis of diminished intracranial pressure following recent lumbar puncture.  -2/8: EEG: non-specific mild cerebral dysfunction.  -2/9: CT Brain: similar to prior MRI with hyperdense areas likely hygromas    Plan:  - Neurology consult: 2/7: Encephalopathy likely from underlying medical disease. Delirium prevention. Avoid Ativan and try Seroquel for delirium. MRI findings is from LP. Okay to try caffiene tablets in the daytime to help with postural headache but patient is allergic to it.  -2/8: Repeat CT head to monitor the status of subdural effusion: stable, EEG: diffuse slowing, Thiamine iv prophylaxis.  -Speech path: Regular and thin liquid. Slightly higher risk for aspiration given encephalopathy.  - Start empiric rifaximin given possibility of HE without elevated ammonia (2/14 ->)  -- Rifaximin dose adjusted to cover for SIBO, then de-escalated back to HE coverage on 2/22   -- holding off on lactulose given diarrhea    # Deconditioning  Pt has not been engaging much with PT/OT despite improved mental status. May be due in part to anxieties about diarrhea/incontinence.  - continue PT/OT  - continue encouragement to participate, particularly by family/familiar faces    # Bloating  May be due to indigestion. Denies constipation. Exam not c/w significant ascites.  - mylanta, simethicone prn    # Hypokalemia:  # Hypomagnesemia:  Replete as needed.    # Vitamin D deficiency:  -Supplements ordered.    # Moderate malnutrition:  -Seen by RD and given recs for supplements.    # History of PE, diagnosed in April 2019  - hold home [place] for now given recent anemia and concerns for bleeding.   - monitor for signs and symptoms of worsening bleeding.     # Elevated  TSH at OSH, likely 2/2 subclinical hypothyroidism  -TSH WNL on 12/15/20.      # Anxiety, emotional distress:   -Patient has stressful social situation, intellectually disabled son, and regarding underlying disease process.     # Chronic buttocks pressure wound  - Wound care consult, appreciate recs  - Position changes as tolerated, waffle cushion, etc  -- Pressure wound ruled out, treating bilateral buttocks moisture and friction damage   - Rectal tube placed 2/18 to avoid contamination of wounds by stool    Wound care:  - Bilateral Groin/Thighs   Gently cleanse with warm water & Remedy no-rinse foam (pH-balanced cleanser) with soft cloths, pat dry ensuring   thoroughly dried.   Do not use CHG to rash areas.   Apply Clotrimazole ointment to rash BID, ensure thoroughly rubbed in.   Apply a piece of Interdry textile [phone]) leaving 2 inches exposed from fold to facilitate wicking.     - Bilateral Buttocks   First rinse with perineal irrigation bottle & warm water to remove as much stool as possible to minimize wiping.   Cleanse wounds & surrounding skin with Anasept wound cleanser spray (VCB#[id number]), pat dry with sterile gauze.   Apply a thick layer of Triad hydrophilic wound dressing paste ([id number]) over wounds & periwound areas.   Promptly clean patient after episodes of incontinence using method described.   Remove only the top, soiled layer of Triad during incontinence cleaning to avoid further eroding the skin & apply another top   layer of Triad following cleaning.   Apply Triad at least BID & PRN to keep area protected.     - Pressure Injury Prevention   Continue q2h repositioning- 30 degree lateral turn to L & R  only unless medically contraindicated to avoid direct pressure on   sacrum.   Place Waffle mattress overlay [id number] under patient in bed to assist with pressure redistribution.   Use waffle cushion while OOB to chair, reposition frequently while in chair every 30 minutes.   Additionally,  float heels completely off of bed. If pillows do not suffice, order heel offloading boots (PMM# [id number]).      # VTE PPx:  Contraindicated: Other: anticipate Plt <50    Severity of Illness  Stable     During this hospitalization the patient is also being treated for:  - Sepsis (present on admission)    - Malignancy associated fatigue on admission  - Neoplasm/malignancy associated pain  - Hypoalbuminemia (present on admission)  - Fluid status: Hypovolemia on admission     - Cachexia  - Encephalopathy: Metabolic encephalopathy   - Pancytopenia 2/2 chemotherapy    Code Status: [place], MD  01/11/22

## 2022-01-11 NOTE — Interdisciplinary (Addendum)
2/27:  0900: CM sw Mark at [facility].  No beds yet.  He said pt is on their list as priority and once a bed opens there will be a hold placed for the pt either today or tomorrow and he'll call CM when this happens.    1500: CM called transfer center to check on a bed.  No beds available, but pt is "on the top of the list."      2/28:   0845: CM sw Katharine Look @ [facility] today.  No beds, they said they have 13 people on wait in their ER and might have a bed that will open later today.    1500: CM called again and still no beds available.    3/2:CM sw [place] 1030.  No beds today they said hopefully tomorrow.

## 2022-01-11 NOTE — Plan of Care (Signed)
Problem: Discharge Planning - Adult  Goal: Knowledge of and participation in plan of care  Outcome: Progress within 12 hours     Problem: Fall, at Risk or Actual - Adult  Goal: Absence of falls and fall related injury  Outcome: Progress within 12 hours  Goal: Knowledge of fall prevention  Outcome: Progress within 12 hours     Problem: Delirium - Adult / Pediatric  Goal: Absence or resolution of delirium  Outcome: Progress within 12 hours     Problem: Pain,  Acute / Chronic- Adult  Goal: Control of chronic pain  Outcome: Progress within 12 hours  Goal: Control of acute pain  Outcome: Progress within 12 hours     Problem: Activity Intolerance / Fatigue - Hematological / Immunological / Oncological Condition - Adult  Goal: Able to perform physical activity as ordered  Outcome: Progress within 12 hours     Problem: Coping Ineffective, Patient / Family / Caregiver - Hematological / Immunological / Oncological Condition - Adult  Goal: Effective coping (Patient / Family / Caregiver)  Outcome: Progress within 12 hours     Problem: Fluid Volume, Imbalanced - Hematological / Immunological / Oncological Condition - Adult  Goal: Absence of fluid imbalance signs and symptoms  Outcome: Progress within 12 hours     Problem: Gas Exchange, Impaired- Hematological / Immunological / Oncological Condition - Adult  Goal: Adequate oxygenation (absence of signs and symptoms of hypoxemia)  Outcome: Progress within 12 hours     Problem: GI Elimination Impaired - Hematological / Immunological / Oncological Condition - Adult  Goal: Passage of soft, formed stool  Outcome: Progress within 12 hours     Problem: GU Elimination Impaired - Hematological / Immunological / Oncological Condition - Adult  Goal: Absence of urinary tract infection signs and symptoms  Outcome: Progress within 12 hours     Problem: Injury, High Risk for - Hematological/Immunological/Oncological Condition - Adult  Goal: Absence of injury  Outcome: Progress within 12  hours     Problem: Nutrition, Alteration in - Hematological / Immunological / Oncological Condition - Adult  Goal: Adequate nutritional intake  Outcome: Progress within 12 hours     Problem: Oral Mucus Membranes, at Risk or Alteration in Integrity - Immunological / Oncological Condition - Adult  Goal: Maintain or restore integrity of oral mucus membranes  Outcome: Progress within 12 hours     Problem: Skin Integrity, Impaired - Hematological / Immunological / Oncological Condition - Adult  Goal: Skin / incision / wound healing  Outcome: Progress within 12 hours

## 2022-01-11 NOTE — Discharge Summary (Deleted)
Ariel Braun     Patient Name: Ariel Braun  Patient MRN: [id number]  Date of Birth: Aug 03, 1961     Facility: [facility]  Attending Physician: Melanee Left, [place]: [phone]    Date of Admission: 12/09/2021  Date of Discharge: 01/10/2022    Admission Diagnosis: Multiple myeloma (CMS code) [C90.00]  Discharge Diagnosis: Multiple myeloma (CMS code)    Discharge Disposition: Other: [facility]    My date of service is 01/11/22.    History (with Chief Complaint)    [name] is a 61 year old female with pmx of MM. 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 M spike 32, kappa 596.9 mg/L, IgG 4750, B2M 3.88. The patient has received VRd X4 (achieving VGPR) then Mel 200 ASCT 05/12/11. 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 She met the criteria for PD on 02/19/2020 and was started on third line DARA PD in June 2021.     The patient was recently hospitalized at [facility] on 12/02/21 after recommendation of Trish Mage during a video visit, she was complaining of increased weakness, persistent diarrhea. The patient had reporting having chronic diarrhea since her COVID booster in August 2022 and had developed clinical deterioration over the past month.     Her hospital course was complicated by ICU admission for hypovolemic shock intermittently requiring pressor support (phenylephrine gtt).  She was found to be pancytopenic with hg of 6.6 and Lactate of 11. Patient had initially refused prbc transfusion. Patient was found to be positive for norovirus. LFTs were elevated consistent with acute liver failure (AST 118, [name], Alp 958, TB 12.1). procal 1.2. MRI of the liver with MRCP was done which showed hepatic steatosis. RUQ ultrasound showed an enlarged liver with large 2cm stone in the gallbladder. HIDA scan was done: findings were not  consistent with cystic duct obstruction or acute cholecystitis. The patient was evaluated by GI and advised to follow with hepatology Dr. Loman Brooklyn for acute liver failure workup, liver biopsy. Patient also has a history of pulmonary embolism on eliquis which was held for progressive anemia. Patient was found to have an appropriate rise in cortisol with ACTH stimulation, patient refusing decadron. Creatinine was wnls.     Per daughter, con fusion started about a week prior to coming into the hospital at the last admission. There has been some improvement from baseline. Still having diarrhea, although the frequency of the diarrhea has decreased during course of hospitalization.     Brief Hospital Course by Problem    # MM with lytic lesions in the L spine  IgG Kappa MM on 09/07/2010.  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   June 2020 due to progression of M spike Revlimid was increased to 15 mg PO D1-21 every 28 days, plus dexamethasone  July 2021: started daratumumab, Pomylast 2 mg D 1-21, Q28 days, dexamethasone 12 mg PO Q weekly.   Primary oncologist: Dr. Iona Coach; Dr. Kennon Rounds  hematology  Receiving dexamethasone 12 mg weekly for diarrhea as outpt  Receiving MT with daratumumab with pomalyst and dex, most recent dose 10/16/21, has since been on hold due to worsening diarrhea.    DATA:   09/07/2010: IgG Kappa MM  12/02/2021: Ig M <5, IgG 447, IgA 20, Ig E <2, SFLC Kappa 16.6, Lambda 1.6, Kappa/Lamda 10.38  12/14/21: IgG K on IFE, IgG 430, KLC 16.6, LLC 2.7, KLR 6.15    Chemo:   Holding Daratumumab and Pomalyst, last dose 10/20/21, given recent infection and acute liver failure.    Supportive Care:   Transfusion: Keep Hg >7, and Platelets >10.   Per daughter, has autoantibodies in blood was a difficult match. Would like to be contacted prior to transfusion.   Line:PICC placed  1/26, kept in place for transfer    Outpatient Oncologist: Dr. Kennon Rounds    Dispo: Transfer back to [facility], likely followed by SNF    # Immunocompromised  # CMV colitis  # At risk for infections due tomalignancy  # Shock, likely hypovolemic vs distributive in the setting of chronic diarrhea, + norovirus infection  # Acute on chronic diarrhea, norovirus positive (presumed colonized), O/P negative   -No prior evidence of adrenal insufficiency, AM cortisol levels normal, previously on dexamethasone 8 mg PO Qweekly, but has been held since early December.    Data:  - CT brain: No acute intracranial hemorrhage, herniation, or hydrocephalus.  - CT chest/A/P pending: Medium-sized bilateral pleural effusions with bilateral peribronchovascular groundglass opacities, suggestive of pulmonary edema. Clustered nodules in right middle lobe along the right minor fissure, which could represent mild aspiration or infection. Numerous sclerotic and mixed lytic/sclerotic lesions throughout the thoracic spine and bilateral ribs, increased in size and number compared to prior. Appearance is atypical for multiple myeloma but may represent a combination of treated, partially treated, and active disease. No acute pathologic fracture. Findings compatible with enterocolitis of infectious or inflammatory etiology. Involvement of the colon is greater than of the small bowel. Marked hepatic steatosis. Increased sclerosis of bone lesions throughout the thoracolumbar spine, bony pelvis, and proximal left femur. Appearance is atypical for multiple myeloma but may represent a combination of treated, partially treated, and active disease. No acute pathologic fracture.  - 1/29:CMV PCR 474,259DGLO  - 1/31: GI PCR panel positive only for Norovirus.   2/1: LP results: Neutrophils 6% in tube 1, Lymphocytes 88% (predominant), protein 42 (normal), glucose 36 (low), rapid HSV in CSF negative. CMV CSF PCR negative    - 2/2 CMV PCR 179,555High    - 2/2 Flex Sig Biopsies prelim: "colonic mucosa with a rare small non-necrotizing mucosal granuloma and rare CMV immunolabeling"   - 2/5 [place] work up: sIL2 (1556.6 high), ferritin (948 high), TG(258 high), NKA-IFN? 10 (WNL)  - 2/8: CMV PCR: 3107 (sig improvement)  - 2/10: C diff: negative.  - 2/12: Repeat GI viral PCR: positive for Norovirus  - 2/15 CMV 755  - 2/17 IgG 745  - 2/22: Repeat GI viral PCR positive for Norovirus    - 2/22 CMV 74    Plan:  - appreciate ID recs  -- Ganciclovir 5 mg/kg (1/31-2/21). S/p one dose of IVIG on 2/7.   -- S/p Valcyte 900 mg BID (2/21 -> 2/23) -> transitioned to maintenance Valcyte 416m BID   -- S/p Alinia (nitazoxanide) for 7 days (2/16 - 2/22) for norovirus treatment. Repeat GI viral PCR was persistently positive for Norovirus on 2/22, but ID said it is likely from colonization. No need to treat further  -- s/p IV Zosyn4.5 g Q8hrs (1/26-1/28, restarted 1/29 - 2/1 for recurrent fever)  -- GI consulted, s/p flex sig 2/2  -- Weekly CMV  -- Rifaximin for hepatic encephalopathy and SIBO as  below     # Acute liver injury of unclear etiology  # Acuteon chronicencephalopathy likely 2/2 infectious vs hepaticvs metabolic  # Hyperbilirubinemia  # Anasarca  # Hypoalbuminemia    Data:  -Steady rise in LFTs in 05/2021,  -12/02/21: AST 152--> 121, ALT 130--> 110, TB 5.4, Albumin 1.1  -Hepatitis panel negative.  -1/26:  Tbili 14.6, GGT 1160, ammonia 25  -CTAP 12/02/21: Scattered sclerotic lesions in the lumbar spine, hepatic steatosis.   -RUQ ultrasound: liver enlarged with diffuse increased echogenicity, consistent with fatty infiltration. 2cm stone in the gallbladder.  -HIDA scan:Normal radiotracer filling of the gallbladder is identified, therefore the exam is not consistent with cystic duct obstruction or acute cholecystitis.  - [place] abd 1/27: Hepatomegaly with diffusely increased echogenicity which can be seen with hepatic amyloidosis or steatosis. Please refer to the recently  performed biopsy for further details. Mild perihepatic ascites.Patent hepatic vasculature with normal waveforms. Cholelithiasis without acute cholecystitis. Right pleural effusion.  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 CMV stains: Negative.  - 1/30: Serum studies: Ferritin 2380, iron serum 60, transferrin 46 (low), 93 % sat (high), HEV IgM negative, PCR forVZV negative, HSV negative,CMV+ 443,961High,EBV negative, AMA negative, Hepatitis B core Ab, surface Ab negative, Hep A IgG +/IgM negative  - 2/3 IgG subclass 4: 2 (total 444, subclasses 1/2/3 = 308/25/11 respectively)    Plan:  - Hepatology recs: 2/7: Path looks unlikely for vanishing bile duct syndrome. Could be drug related. Increase Ursodiol to 600 mg BID, Check Hepatitis E. 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 Unlikely diarrhea from Ursodiol.  -Avoiding steroids  - Daily LFT/INR  - RD consult placed to expand diet to more than protein shakes.  -- Family requesting appetite stimulant but risk > benefit.   - Expect improvement over weeks to months    # Acute toxic encephalopathy, improving  In the setting of liver failure, ammonia only 25. LP performed 2/1 to r/o encephalitis.     Data:   1/31: HIV, TSH, Folate, B12 WNL  1/30: CT head WNL other than sinus disease  2/1: LP results: Neutrophils 6% in tube 1, Lymphocytes 88% (predominant), protein 42 (normal), glucose 36 (low), rapid HSV in CSF negative. CMV CSF PCR negative  2/5: Brain MRI: Smooth diffuse dural thickening and enhancement as well as bilateral holohemispheric subdural effusions, which may be on the basis of diminished intracranial  pressure following recent lumbar puncture.  -2/8: EEG: non-specific mild cerebral dysfunction.  -2/9: CT Brain: similar to prior MRI with hyperdense areas likely hygromas    Plan:  - Neurology consult: 2/7: Encephalopathy likely from underlying medical disease. Delirium prevention. Avoid Ativan and try Seroquel for delirium. MRI findings is from LP. Okay to try caffiene tablets in the daytime to help with postural headache but patient is allergic to it.  -2/8: Repeat CT head to monitor the status of subdural effusion: stable, EEG: diffuse slowing, Thiamine iv prophylaxis.  -Speech path: Regular and thin liquid. Slightly higher risk for aspiration given encephalopathy.  - Start empiric rifaximin given possibility of HE without elevated ammonia (2/14 ->)  -- Rifaximin dose adjusted to cover for SIBO, then de-escalated back to HE coverage on  2/22   -- holding off on lactulose given diarrhea    # Deconditioning  Pt has not been engaging much with PT/OT despite improved mental status. May be due in part to anxieties about diarrhea/incontinence.  - continue PT/OT  - continue encouragement to participate, particularly by family/familiar faces  - anticipate placement in SNF/ARU following inpatient admission    # Bloating  May be due to indigestion. Denies constipation. Exam not c/w significant ascites.  - mylanta, simethicone prn    # Hypokalemia:  # Hypomagnesemia:  Replete as needed.    # Vitamin D deficiency:  -Supplements ordered.    # Moderate malnutrition:  -Seen by RD and given recs for supplements.    # History of PE, diagnosed in April 2019  - hold home [place] for now givenrecent anemia and concerns for bleeding.  - monitor for signs and symptoms of worsening bleeding.     # Elevated TSH at OSH, likely 2/2 subclinical hypothyroidism  -TSH WNL on 12/15/20.     # Anxiety, emotional distress:  -Patient has stressful social situation, intellectually disabled son, and regarding underlying disease  process.    # Chronic buttocks pressure wound  - Wound care consult, appreciate recs  - Position changes as tolerated, waffle cushion, etc  -- Pressure wound ruled out, treating bilateral buttocks moisture and friction damage   - Rectal tube placed 2/18 to avoid contamination of wounds by stool, removed once diarrhea resolved    Wound care:  - Bilateral Groin/Thighs   Gently cleanse with warm water & Remedy no-rinse foam (pH-balanced cleanser) with soft cloths, pat dry ensuring   thoroughly dried.   Do not use CHG to rash areas.   Apply Clotrimazole ointment to rash BID, ensure thoroughly rubbed in.   Apply a piece of Interdry textile [phone] leaving 2 inches exposed from fold to facilitate wicking.     - Bilateral Buttocks   First rinse with perineal irrigation bottle & warm water to remove as much stool as possible to minimize wiping.   Cleanse wounds & surrounding skin with Anasept wound cleanser spray (EXB#[id number]), pat dry with sterile gauze.   Apply a thick layer of Triad hydrophilic wound dressing paste (GMW#[id number]) over wounds & periwound areas.   Promptly clean patient after episodes of incontinence using method described.   Remove only the top, soiled layer of Triad during incontinence cleaning to avoid further eroding the skin & apply another top   layer of Triad following cleaning.   Apply Triad at least BID & PRN to keep area protected.     - Pressure Injury Prevention   Continue q2h repositioning- 30 degree lateral turn to L & R only unless medically contraindicated to avoid direct pressure on   sacrum.   Place Waffle mattress overlay [phone] under patient in bed to assist with pressure redistribution.   Use waffle cushion while OOB to chair, reposition frequently while in chair every 30 minutes.   Additionally, float heels completely off of bed. If pillows do not suffice, order heel offloading boots (PMM# [id number]).      Nutritional Assessment  Moderate Chronic disease or condition related  malnutrition    The patient has the following conditions and comorbidities:  - Sepsis (present on admission)    - Malignancy associated fatigue on admission  - Neoplasm/malignancy associated pain  - Hypoalbuminemia (present on admission)  - Fluid status: Hypovolemia on admission     - Cachexia  - Encephalopathy: Metabolic encephalopathy   -  Pancytopenia 2/2 chemotherapy    Physical Exam at Discharge  BP (!) 130/88 (BP Location: Right upper arm, Patient Position: Lying)   Pulse 85   Temp 36.9 C (98.4 F) (Oral)   Resp 18   Ht 153 cm (5' 0.24")   Wt 93 kg (205 lb 0.4 oz)   LMP  (LMP Unknown)   SpO2 98%   BMI 39.73 kg/m       Intake/Output Summary (Last 24 hours) at 01/11/2022 1545  Last data filed at 01/11/2022 0451  Gross per 24 hour   Intake 3400 ml   Output 350 ml   Net 3050 ml       Physical Exam  Vitals reviewed.   Constitutional:       Appearance: She is ill-appearing.   HENT:      Head: Normocephalic and atraumatic.      Nose: Nose normal.      Mouth/Throat:      Mouth: Mucous membranes are moist.      Pharynx: Oropharynx is clear.   Eyes:      General: Scleral icterus present.      Extraocular Movements: Extraocular movements intact.      Pupils: Pupils are equal, round, and reactive to light.   Cardiovascular:      Rate and Rhythm: Normal rate and regular rhythm.   Pulmonary:      Effort: Pulmonary effort is normal. No respiratory distress.   Abdominal:      General: Abdomen is flat. There is no distension.      Palpations: Abdomen is soft. There is no shifting dullness or fluid wave.      Tenderness: There is abdominal tenderness (mild, diffuse).   Musculoskeletal:      Cervical back: Normal range of motion and neck supple.      Right lower leg: No edema.      Left lower leg: No edema.   Skin:     General: Skin is warm and dry.      Coloration: Skin is jaundiced.   Neurological:      Mental Status: She is alert and oriented to person, place, and time.   Psychiatric:         Mood and Affect: Mood  normal.         Behavior: Behavior is cooperative.         Relevant Labs, Radiology, and Other Studies  Lab Results (last 72 hours)     Procedure Component Value Units Date/Time    Complete Blood Count with Differential [[id number]]  (Abnormal) Collected: 01/11/22 0441    Order Status: Completed Specimen: Whole Blood Updated: 01/11/22 0642     WBC Count 4.5 x10E9/L      RBC Count 2.43 x10E12/L      Hemoglobin 8.6 g/dL      Hematocrit 26.1 %      MCV 107 fL      MCH 35.4 pg      MCHC 33.0 g/dL      Platelet Count 45 x10E9/L      Comment: Repeated        MPV 13.4 fL      RDW-CV Unable to measure %      Abs Neutrophils 3.94 x10E9/L      Abs Lymphocytes 0.12 x10E9/L      Abs Monocytes 0.33 x10E9/L      Abs Eosinophils 0.00 x10E9/L      Abs Basophils 0.00 x10E9/L      Abs Imm  Granulocytes 0.07 x10E9/L     Comprehensive Metabolic Panel (BMP, AST, ALT, T.BILI, ALKP, TP, ALB) [[id number]]  (Abnormal) Collected: 01/11/22 0441    Order Status: Completed Specimen: Blood Updated: 01/11/22 0627     Albumin, Serum / Plasma 1.1 g/dL      Alkaline Phosphatase 334 U/L      Alanine transaminase 63 U/L      AST 78 U/L      Bilirubin, Total 10.5 mg/dL      Urea Nitrogen, Serum / Plasma 3 mg/dL      Calcium, total, Serum / Plasma 7.4 mg/dL      Chloride, Serum / Plasma 114 mmol/L      Creatinine 0.37 mg/dL      eGFRcr 115 mL/min/1.38m      Comment: Effective 02/11/2021, eGFRcr calculated using CKD-EPI (2021) equation without race coefficient (NKF-ASN recommendations).        Potassium, Serum / Plasma 3.7 mmol/L      Sodium, Serum / Plasma 141 mmol/L      Protein, Total, Serum / Plasma 3.1 g/dL      Carbon Dioxide, Total 24 mmol/L      Anion Gap 3     Comment: Anion gap is calculated as Na-(Cl+CO2)        Glucose, non-fasting 116 mg/dL      Comment: If the patient is fasting, suggests impaired glucose homeostasis       Prothrombin Time [[[id number]] (Abnormal) Collected: 01/11/22 0441    Order Status: Completed Specimen: Blood Updated:  01/11/22 0608     PT 17.0 s      INR 1.5    Fibrinogen, Functional [[[id number]] (Abnormal) Collected: 01/11/22 0441    Order Status: Completed Specimen: Blood Updated: 01/11/22 0608     Fibrinogen, Functional 173 mg/dL     Magnesium, Serum / Plasma [[[id number]]Collected: 01/11/22 0441    Order Status: Completed Specimen: Blood Updated: 01/11/22 0600     Magnesium, Serum / Plasma 2.1 mg/dL     Bilirubin, Direct [[[id number]] (Abnormal) Collected: 01/11/22 0441    Order Status: Completed Specimen: Blood Updated: 01/11/22 0600     Bilirubin, Direct 7.6 mg/dL     Basic Metabolic Panel (NA, K, CL, CO2, BUN, CR, GLU, CA) [[[id number]] (Abnormal) Collected: 01/10/22 1608    Order Status: Completed Specimen: Blood Updated: 01/10/22 1721     Urea Nitrogen, Serum / Plasma 2 mg/dL      Calcium, total, Serum / Plasma 7.6 mg/dL      Chloride, Serum / Plasma 114 mmol/L      Creatinine 0.33 mg/dL      eGFRcr 118 mL/min/1.751m     Comment: Effective 02/11/2021, eGFRcr calculated using CKD-EPI (2021) equation without race coefficient (NKF-ASN recommendations).        Potassium, Serum / Plasma 3.5 mmol/L      Glucose, non-fasting 119 mg/dL      Comment: If the patient is fasting, suggests impaired glucose homeostasis        Sodium, Serum / Plasma 142 mmol/L      Carbon Dioxide, Total 24 mmol/L      Anion Gap 4     Comment: Anion gap is calculated as Na-(Cl+CO2)       Complete Blood Count with Differential [[id number]](Abnormal) Collected: 01/10/22 0613    Order Status: Completed Specimen: Whole Blood Updated: 01/10/22 0758     WBC Count 3.2 x10E9/L      RBC Count 2.64 x10E12/L  Hemoglobin 9.2 g/dL      Hematocrit 27.7 %      MCV 105 fL      MCH 34.8 pg      MCHC 33.2 g/dL      Platelet Count 51 x10E9/L      Comment: Repeated        MPV 13.4 fL      RDW-CV Unable to measure %      Abs Neutrophils 2.71 x10E9/L      Abs Lymphocytes 0.21 x10E9/L      Abs Monocytes 0.19 x10E9/L      Abs Eosinophils 0.01 x10E9/L      Abs Basophils 0.00  x10E9/L      Abs Imm Granulocytes 0.05 x10E9/L     Comprehensive Metabolic Panel (BMP, AST, ALT, T.BILI, ALKP, TP, ALB) [[id number]]  (Abnormal) Collected: 01/10/22 0613    Order Status: Completed Specimen: Blood Updated: 01/10/22 0737     Albumin, Serum / Plasma 1.2 g/dL      Alkaline Phosphatase 372 U/L      Alanine transaminase 64 U/L      AST 77 U/L      Bilirubin, Total 11.2 mg/dL      Urea Nitrogen, Serum / Plasma 2 mg/dL      Calcium, total, Serum / Plasma 7.4 mg/dL      Chloride, Serum / Plasma 113 mmol/L      Creatinine 0.33 mg/dL      eGFRcr 118 mL/min/1.22m      Comment: Effective 02/11/2021, eGFRcr calculated using CKD-EPI (2021) equation without race coefficient (NKF-ASN recommendations).        Potassium, Serum / Plasma 3.4 mmol/L      Sodium, Serum / Plasma 142 mmol/L      Protein, Total, Serum / Plasma 3.3 g/dL      Carbon Dioxide, Total 24 mmol/L      Anion Gap 5     Comment: Anion gap is calculated as Na-(Cl+CO2)        Glucose, non-fasting 82 mg/dL     Magnesium, Serum / Plasma [[[id number]]Collected: 01/10/22 0613    Order Status: Completed Specimen: Blood Updated: 01/10/22 0737     Magnesium, Serum / Plasma 1.7 mg/dL     Bilirubin, Direct [[[id number]] (Abnormal) Collected: 01/10/22 0613    Order Status: Completed Specimen: Blood Updated: 01/10/22 0737     Bilirubin, Direct 8.0 mg/dL     Prothrombin Time [[[id number]] (Abnormal) Collected: 01/10/22 0613    Order Status: Completed Specimen: Blood Updated: 01/10/22 0726     PT 16.4 s      INR 1.4    Fibrinogen, Functional [[[id number]] (Abnormal) Collected: 01/10/22 09201   Order Status: Completed Specimen: Blood Updated: 01/10/22 0726     Fibrinogen, Functional 172 mg/dL     Basic Metabolic Panel (NA, K, CL, CO2, BUN, CR, GLU, CA) [[[id number]] (Abnormal) Collected: 01/09/22 2143    Order Status: Completed Specimen: Blood Updated: 01/09/22 2230     Urea Nitrogen, Serum / Plasma 2 mg/dL      Calcium, total, Serum / Plasma 7.4 mg/dL      Chloride, Serum  / Plasma 112 mmol/L      Creatinine 0.33 mg/dL      eGFRcr 118 mL/min/1.741m     Comment: Effective 02/11/2021, eGFRcr calculated using CKD-EPI (2021) equation without race coefficient (NKF-ASN recommendations).        Potassium, Serum / Plasma 3.4 mmol/L  Glucose, non-fasting 82 mg/dL      Sodium, Serum / Plasma 140 mmol/L      Carbon Dioxide, Total 23 mmol/L      Anion Gap 5     Comment: Anion gap is calculated as Na-(Cl+CO2)       Comprehensive Metabolic Panel (BMP, AST, ALT, T.BILI, ALKP, TP, ALB) [[id number]]  (Abnormal) Collected: 01/09/22 0417    Order Status: Completed Specimen: Blood Updated: 01/09/22 0826     Albumin, Serum / Plasma 1.1 g/dL      Comment: Collection questioned, suggest repeat.        Alkaline Phosphatase 362 U/L      Comment: Collection questioned, suggest repeat.        Alanine transaminase 61 U/L      Comment: Collection questioned, suggest repeat.        AST 72 U/L      Comment: Collection questioned, suggest repeat.        Bilirubin, Total 10.7 mg/dL      Comment: Collection questioned, suggest repeat.        Urea Nitrogen, Serum / Plasma 2 mg/dL      Comment: Collection questioned, suggest repeat.        Calcium, total, Serum / Plasma 6.9 mg/dL      Comment: Collection questioned, suggest repeat.        Chloride, Serum / Plasma 126 mmol/L      Comment: Collection questioned, suggest repeat.        Creatinine 0.32 mg/dL      Comment: Collection questioned, suggest repeat.        eGFRcr 119 mL/min/1.37m      Comment: Effective 02/11/2021, eGFRcr calculated using CKD-EPI (2021) equation without race coefficient (NKF-ASN recommendations).        Potassium, Serum / Plasma >10.0 mmol/L      Comment: Collection questioned, suggest repeat.  Reported with read back confirmation  TO [place] [id number] MEBM          Sodium, Serum / Plasma 131 mmol/L      Comment: Collection questioned, suggest repeat.        Protein, Total, Serum / Plasma 3.0 g/dL      Comment:  Collection questioned, suggest repeat.        Carbon Dioxide, Total 21 mmol/L      Comment: Collection questioned, suggest repeat.        Anion Gap <1     Glucose, non-fasting 87 mg/dL      Comment: Collection questioned, suggest repeat.       Magnesium, Serum / Plasma [[[id number]]Collected: 01/09/22 0417    Order Status: Completed Specimen: Blood Updated: 01/09/22 0826     Magnesium, Serum / Plasma 1.8 mg/dL      Comment: Collection questioned, suggest repeat.       Bilirubin, Direct [[[id number]] (Abnormal) Collected: 01/09/22 0417    Order Status: Completed Specimen: Blood Updated: 01/09/22 0826     Bilirubin, Direct 7.5 mg/dL      Comment: Collection questioned, suggest repeat.       Basic Metabolic Panel (NA, K, CL, CO2, BUN, CR, GLU, CA) [[[id number]] (Abnormal) Collected: 01/09/22 0651    Order Status: Completed Specimen: Blood Updated: 01/09/22 0815     Urea Nitrogen, Serum / Plasma 2 mg/dL      Calcium, total, Serum / Plasma 7.4 mg/dL      Chloride, Serum / Plasma 113 mmol/L  Creatinine 0.34 mg/dL      eGFRcr 117 mL/min/1.48m      Comment: Effective 02/11/2021, eGFRcr calculated using CKD-EPI (2021) equation without race coefficient (NKF-ASN recommendations).        Potassium, Serum / Plasma 3.2 mmol/L      Glucose, non-fasting 88 mg/dL      Sodium, Serum / Plasma 140 mmol/L      Carbon Dioxide, Total 23 mmol/L      Anion Gap 4     Comment: Anion gap is calculated as Na-(Cl+CO2)       Complete Blood Count with Differential [[[id number]] (Abnormal) Collected: 01/09/22 0417    Order Status: Completed Specimen: Whole Blood Updated: 01/09/22 0628     WBC Count 3.6 x10E9/L      RBC Count 2.65 x10E12/L      Hemoglobin 9.1 g/dL      Hematocrit 27.7 %      MCV 105 fL      MCH 34.3 pg      MCHC 32.9 g/dL      Platelet Count 57 x10E9/L      Comment: Repeated        MPV 12.2 fL      RDW-CV Unable to measure %      Abs Neutrophils 3.10 x10E9/L      Abs Lymphocytes 0.20 x10E9/L      Abs Monocytes 0.22 x10E9/L      Abs  Eosinophils 0.01 x10E9/L      Abs Basophils 0.01 x10E9/L      Abs Imm Granulocytes 0.05 x10E9/L     Prothrombin Time [[[id number]] (Abnormal) Collected: 01/09/22 0417    Order Status: Completed Specimen: Blood Updated: 01/09/22 0618     PT 16.6 s      INR 1.4    Fibrinogen, Functional [[[id number]] (Abnormal) Collected: 01/09/22 0417    Order Status: Completed Specimen: Blood Updated: 01/09/22 0618     Fibrinogen, Functional 155 mg/dL     Basic Metabolic Panel (NA, K, CL, CO2, BUN, CR, GLU, CA) [[[id number]] (Abnormal) Collected: 01/08/22 1557    Order Status: Completed Specimen: Blood Updated: 01/08/22 1719     Urea Nitrogen, Serum / Plasma 2 mg/dL      Calcium, total, Serum / Plasma 7.4 mg/dL      Chloride, Serum / Plasma 113 mmol/L      Creatinine 0.31 mg/dL      eGFRcr 120 mL/min/1.747m     Comment: Effective 02/11/2021, eGFRcr calculated using CKD-EPI (2021) equation without race coefficient (NKF-ASN recommendations).        Potassium, Serum / Plasma 3.3 mmol/L      Glucose, non-fasting 87 mg/dL      Sodium, Serum / Plasma 141 mmol/L      Carbon Dioxide, Total 24 mmol/L      Anion Gap 4     Comment: Anion gap is calculated as Na-(Cl+CO2)             Procedures Performed and Complications  1/8/92ICC  1/27 IR liver biopsy  2/1 LP    Nutritional Assessment  Moderate Chronic disease or condition related malnutrition    During this hospital stay, the patient was treated for the following conditions:   - Sepsis (present on admission)    - Malignancy associated fatigue on admission  - Neoplasm/malignancy associated pain  - Hypoalbuminemia (present on admission)  - Fluid status: Hypovolemia on admission     - Cachexia  - Encephalopathy: Metabolic encephalopathy   -  Pancytopenia 2/2 chemotherapy    I spent 45 minutes preparing discharge materials, prescriptions, follow up plans, and face-to-face time with the patient/family discussing inpatient findings/plans.    DISCHARGE INSTRUCTIONS    Discharge Diet  Regular  Diet    PHYSICAL THERAPY ASSESSMENTS AND RECOMMENDATIONS AT DISCHARGE   Available equipment or existing home modifications:  No DME.  Prior functional limitations:  Independent without AD.  Rehab potential:  Patient participates well in therapy and progressing towards goals  Discharge Activity comments:  Bed in chair position, OOB to chair with ceiling lift    Functional Assessment at Discharge/Activity Goals  Patient Discharge Activity     Mobilize with assistance   As directed      Instructions: Prior Level of Function:    Prior Living Environment Comments: Pt lives in [address] [facility] with children in [place]. Bedroom on 2nd story. Has stairs to enter.  Available equipment or existing home modifications: No DME.  Prior functional limitations (device for gait, ADL, IADL, vision, cognition) : Independent without AD.    Nursing Recommendations:    Rehab Assistive Device Recommendation: Vertical dependent lift, Ceiling lift  Activity recommendations comments: Bed in chair position, OOB to chair with ceiling lift      Up to chair for all meals; unless on bedrest; 3 Times Daily   As directed            Allergies and Medications at Discharge    Allergies: Diphenoxylate-atropine and Caffeine    Your Medications at the End of This Hospitalization             0.9% sodium chloride flush syringe Inject 3 mL into the vein 2 (two) times daily as needed (line flush after each use)    acyclovir (ZOVIRAX) 400 mg tablet Take 1 tablet (400 mg total) by mouth Twice a day    albuterol (PROVENTIL) 2.5 mg /3 mL (0.083 %) inhalation solution Use 3 mL (2.5 mg total) by nebulization every 4 (four) hours as needed (wheezing/shortness of breath)    aluminum-magnesium hydroxide-simethicone (MAALOX PLUS) 200-200-20 mg/5 mL suspension Take 30 mL by mouth every 8 (eight) hours as needed for Indigestion (heartburn, bloating (1st))    cholecalciferol, vitamin D3, 1000 UNITS tablet Take 4 tablets (4,000 Units total) by mouth daily    clotrimazole (LOTRIMIN)  1 % cream Apply topically Twice a day Thighs and groin    dextrose 5 % and 0.9 % sodium chloride infusion Inject 75 mL/hr into the vein Continuous    dicyclomine (BENTYL) 10 mg capsule Take 2 capsules (20 mg total) by mouth in the morning and 2 capsules (20 mg total) at noon and 2 capsules (20 mg total) in the evening and 2 capsules (20 mg total) before bedtime.    dry mouth oral rinse (BIOTENE DRY MOUTH) solution 5 mL by Mucous Membrane route in the morning and 5 mL at noon and 5 mL in the evening and 5 mL before bedtime.    HYDROmorphone (DILAUDID) 0.5 mg/0.5 mL injection Inject 0.3-0.8 mL (0.3-0.8 mg total) into the vein every 4 (four) hours as needed (first line pain)    hydrOXYzine (ATARAX) 10 mg tablet Take 1 tablet (10 mg total) by mouth every 8 (eight) hours as needed for Anxiety    loperamide (IMODIUM A-D) 2 mg tablet Take 2 tablets (4 mg total) by mouth 4 (four) times daily as needed for Diarrhea    melatonin 3 mg tablet Take 2 tablets (6 mg total) by mouth nightly  at bedtime    multivitamin complete chewable (FLINTSTONE'S COMPLETE) chewable tablet Chew 1 tablet by mouth daily    ondansetron (ZOFRAN) 4 mg/2 mL injection Inject 4 mL (8 mg total) into the vein every 8 (eight) hours as needed (nausea/vomiting)    rifAXIMin (XIFAXAN) 550 mg tablet Take 1 tablet (550 mg total) by mouth 2 (two) times daily    simethicone (MYLICON) 80 mg chewable tablet Chew 1 tablet (80 mg total) by mouth 2 (two) times daily as needed (Bloating alone)    thiamine (VITAMIN B1) 100 mg/mL injection Inject 2 mL (200 mg total) into the vein Once a day    ursodiol (ACTIGALL) 60 mg/mL syrup Take 10 mL (600 mg total) by mouth 2 (two) times daily    valGANciclovir (VALCYTE) 450 mg tablet Take 1 tablet (450 mg total) by mouth 2 (two) times daily    vitamin E, dl,tocopheryl acet, (VITAMIN E, DL, ACETATE,) 45 mg (100 unit) capsule Take 8 capsules (800 Units total) by mouth daily          Discharge With Opioid Medications for:  ACUTE  PAIN   According to our records, this patient was not taking opioid medications before this hospitalization.   We do not plan to manage refill and the Primary Care Provider should manage refills.   Please evaluate with each refill of an opioid that your patient meets the criteria for ongoing opioid therapy with the goal to avoid chronic opioid use. Please ensure the patient knows about alternatives for pain management and understands how to taper opioids.    CURES database was reviewed and consistent with expected prescribing.        Pending Tests   None    Outside Follow-up   Contact information for other follow-up providers     [facility].    Contact information  [place] [address]  [phone]                   Contact information for after-discharge care     [facility] .    Service: [place] information  [place] [place]                            Booked [place] Appointments  No future appointments.    Pending [place] Referrals  None    Case Management Services Arranged  Case Management Services Arranged: (all recorded)     Outside Services Arranged For [place] Name         [facility]/Hospital    Name of Agency/Facility --    Address Type (copy to patient address) --    Street address --    [place] --    Zip Code --    Phone number --    Fax number --    Authorization # --    MD to MD discussion completed --    RN to RN report phone number --    Start Date for Services --    Additional Instructions --                Discharge Assessment  Condition at discharge:  good  Final Discharge Disposition: Other [facility]  Covid-19 Vaccines     Name Date    Pfizer Sars-Cov-2 Vaccine (Purple Top) 08/14/2020    Manufacturer: Unknown Manufacturer    Lot: [id number]    External: Auto Reconciled From Outside Source    Pfizer  Sars-Cov-2 Vaccine (Purple Top) 03/18/2020    Manufacturer: Unknown Manufacturer    Lot: [id number]    External: Auto Reconciled From Outside [place] Documentation during this hospitalization:    Code Status: FULL    Last [place] (Valid for this hospitalization only)     None        Advance directive, POLST, or Living Will Documents -- Patient Level:    Advance directive, POLST, or Living Will Documents: None found at the patient level.           Advance Care Planning Documentation       Primary Care Physician  Eldridge Scot  Address: [address] [place] [address]   Phone: [phone]  Fax: [phone]     I spent 45 minutes preparing discharge materials, prescriptions, follow up plans, and face-to-face time with the patient/family.    Outside Providers, for pending tests please use the following numbers:   For [facility] - Please Call: [phone]    For [facility] - Please Call: [phone]   For [facility] - Please Call: [phone]    Signed,  [place], MD  01/11/22        Discharge Instructions provided to the patient (if any):    Discharge Instructions    Call clinic immediately for the following: [phone])   Fever greater than 38o C or 100.4o F and/or shaking chills    Shortness of breath    Severe cough    Bleeding that does not stop on its own   Nausea/vomiting that does not get better with nausea medications   Inability to take oral medications   3 or more loose or liquid stools per day   If you have fallen *(see below)   Concerns about your central line    Do NOT use My Chart to communicate any of these symptoms.    ALWAYS CALL 911 for emergencies, such as:   New or increase in chest pain   New or increase in shortness of breath   Changes in mental status, speech, ability to walk   *If you have fallen and hit your head, are bleeding, unable to walk or move extremities, or are in  severe pain.    Do NOT use My Chart to communicate any of these symptoms.    General tips:    A balance of activities is important. Be sure to gradually increase your exercise, but avoid overexertion & exhaustion.   Hand-washing is important. Be sure that you and the others you live with wash their hands frequently and always before preparing food or after using the bathroom.   You can resume a regular diet unless instructed otherwise by your doctor or nurse practitioner.   Please arrive 1 hour before your scheduled MD, NP or PIC ([facility]) appointment to have your labs drawn, unless you are told otherwise. This allows time for labs results to come in prior to your appointment.    Prescription refills can take time to process. Please request refills before discharge from the hospital, or from the clinic at least 1 week before you run  out of your medication.   You should have your follow up visit scheduled within 24 hours (1 business day) of being discharged from the hospital. If you do not hear from our discharge coordinator within this time, please call our main line ([phone]), press #1, then #3 for an MD or NP appointment or #4 for a PIC (infusion) appointment. You can ask to speak with the discharge coordinator when you reach a live person.           Patient Instructions    None

## 2022-01-12 LAB — PROTHROMBIN TIME
INR: 1.5 — ABNORMAL HIGH (ref 0.9–1.2)
PT: 17 s — ABNORMAL HIGH (ref 11.6–15.0)

## 2022-01-12 LAB — BASIC METABOLIC PANEL (NA, K, CL, CO2, BUN, CR, GLU, CA)
Anion Gap: 6 (ref 4–14)
Calcium, total, Serum / Plasma: 7.6 mg/dL — ABNORMAL LOW (ref 8.4–10.5)
Carbon Dioxide, Total: 24 mmol/L (ref 22–29)
Chloride, Serum / Plasma: 113 mmol/L — ABNORMAL HIGH (ref 101–110)
Creatinine: 0.37 mg/dL — ABNORMAL LOW (ref 0.55–1.02)
Glucose, non-fasting: 100 mg/dL (ref 70–199)
Potassium, Serum / Plasma: 3.1 mmol/L — ABNORMAL LOW (ref 3.5–5.0)
Sodium, Serum / Plasma: 143 mmol/L (ref 135–145)
Urea Nitrogen, Serum / Plasma: 3 mg/dL — ABNORMAL LOW (ref 7–25)
eGFRcr: 115 mL/min/{1.73_m2} (ref >59)

## 2022-01-12 LAB — COMPREHENSIVE METABOLIC PANEL (BMP, AST, ALT, T.BILI, ALKP, TP ALB)
AST: 85 U/L — ABNORMAL HIGH (ref 5–44)
Alanine transaminase: 71 U/L — ABNORMAL HIGH (ref 10–61)
Albumin, Serum / Plasma: 1.1 g/dL — ABNORMAL LOW (ref 3.4–4.8)
Alkaline Phosphatase: 327 U/L — ABNORMAL HIGH (ref 38–108)
Anion Gap: 5 (ref 4–14)
Bilirubin, Total: 10.6 mg/dL — ABNORMAL HIGH (ref 0.2–1.2)
Calcium, total, Serum / Plasma: 7.4 mg/dL — ABNORMAL LOW (ref 8.4–10.5)
Carbon Dioxide, Total: 24 mmol/L (ref 22–29)
Chloride, Serum / Plasma: 114 mmol/L — ABNORMAL HIGH (ref 101–110)
Creatinine: 0.35 mg/dL — ABNORMAL LOW (ref 0.55–1.02)
Glucose, non-fasting: 101 mg/dL (ref 70–199)
Potassium, Serum / Plasma: 3 mmol/L — ABNORMAL LOW (ref 3.5–5.0)
Protein, Total, Serum / Plasma: 3.1 g/dL — ABNORMAL LOW (ref 6.3–8.6)
Sodium, Serum / Plasma: 143 mmol/L (ref 135–145)
Urea Nitrogen, Serum / Plasma: 3 mg/dL — ABNORMAL LOW (ref 7–25)
eGFRcr: 116 mL/min/{1.73_m2} (ref >59)

## 2022-01-12 LAB — FIBRINOGEN, FUNCTIONAL: Fibrinogen, Functional: 164 mg/dL — ABNORMAL LOW (ref 202–430)

## 2022-01-12 LAB — COMPLETE BLOOD COUNT WITH DIFFERENTIAL
Abs Basophils: 0 10*9/L (ref 0.00–0.10)
Abs Eosinophils: 0.01 10*9/L (ref 0.00–0.40)
Abs Imm Granulocytes: 0.04 10*9/L (ref <0.10)
Abs Lymphocytes: 0.17 10*9/L — ABNORMAL LOW (ref 1.00–3.40)
Abs Monocytes: 0.31 10*9/L (ref 0.20–0.80)
Abs Neutrophils: 2.79 10*9/L (ref 1.80–6.80)
Hematocrit: 25.7 % — ABNORMAL LOW (ref 36.0–46.0)
Hemoglobin: 8.5 g/dL — ABNORMAL LOW (ref 12.0–15.5)
MCH: 35.6 pg — ABNORMAL HIGH (ref 26.0–34.0)
MCHC: 33.1 g/dL (ref 31.0–36.0)
MCV: 108 fL — ABNORMAL HIGH (ref 80–100)
MPV: 12.6 fL (ref 9.1–12.6)
Platelet Count: 47 10*9/L — ABNORMAL LOW (ref 140–450)
RBC Count: 2.39 10*12/L — ABNORMAL LOW (ref 4.00–5.20)
RDW-CV: UNDETERMINED % (ref 11.7–14.4)
WBC Count: 3.3 10*9/L — ABNORMAL LOW (ref 3.4–10.0)

## 2022-01-12 LAB — MAGNESIUM, SERUM / PLASMA: Magnesium, Serum / Plasma: 1.9 mg/dL (ref 1.6–2.6)

## 2022-01-12 LAB — BILIRUBIN, DIRECT: Bilirubin, Direct: 7.5 mg/dL — ABNORMAL HIGH (ref <0.6)

## 2022-01-12 LAB — COVID-19 RNA, RT-PCR/NUCLEIC ACID AMPLIFICATION: COVID-19 RNA, RT-PCR/Nucleic Acid Amplification: NOT DETECTED

## 2022-01-12 MED FILL — XIFAXAN 550 MG TABLET: 550 mg | ORAL | Qty: 1

## 2022-01-12 MED FILL — DICYCLOMINE 10 MG CAPSULE: 10 mg | ORAL | Qty: 2

## 2022-01-12 MED FILL — FLINTSTONES COMPLETE (IRON) CHEWABLE TABLET: ORAL | Qty: 1

## 2022-01-12 MED FILL — VITAMIN E (DL, ACETATE) 45 MG (100 UNIT) CAPSULE: 45 mg (100 unit) | ORAL | Qty: 8

## 2022-01-12 MED FILL — URSODIOL (BULK) 100 % POWDER: 100 % | Qty: 0.6

## 2022-01-12 MED FILL — POTASSIUM CHLORIDE 20 MEQ/50 ML IN STERILE WATER INTRAVENOUS PIGGYBACK: 20 mEq/50 mL | INTRAVENOUS | Qty: 50

## 2022-01-12 MED FILL — POTASSIUM CHLORIDE 40 MEQ/100ML IN STERILE WATER INTRAVENOUS PIGGYBACK: 40 mEq/100 mL | INTRAVENOUS | Qty: 100

## 2022-01-12 MED FILL — CHOLECALCIFEROL (VITAMIN D3) 25 MCG (1,000 UNIT) TABLET: 1000 UNITS | ORAL | Qty: 4

## 2022-01-12 MED FILL — MELATONIN 1 MG TABLET: 1 mg | ORAL | Qty: 1

## 2022-01-12 MED FILL — VALGANCICLOVIR 450 MG TABLET: 450 mg | ORAL | Qty: 1

## 2022-01-12 MED FILL — THIAMINE HCL (VITAMIN B1) 100 MG/ML INJECTION SOLUTION: 100 mg/mL | INTRAMUSCULAR | Qty: 2

## 2022-01-12 MED FILL — DILAUDID (PF) 0.5 MG/0.5 ML INJECTION SYRINGE: 0.5 mg/ mL | INTRAMUSCULAR | Qty: 0.5

## 2022-01-12 NOTE — Consults (Signed)
OCCUPATIONAL THERAPY CANCELED SESSION NOTE     OT session missed for the following reason(s): Patient requests deferring treatment  Missed/Cancelled Session Comment: OT attempted to help pt get OOB but pt refused.  "I don't want to do anything today. I had a big bowel movement and I had to go back and forth and back and forth while they cleaned me up. Tomorrow, I will get up to the chair tomorrow." OT educated and encouraged pt to sit EOB or sling to chair later today and she was adamant about not participating stating "I want to call someone. You people just don't listen to me. I want to call someone about this."                  Helmut Muster, OT  01/12/22

## 2022-01-12 NOTE — Progress Notes (Signed)
[facility] PROGRESS NOTE  ATTENDING ONLY     My date of service is 01/12/22.    24 Hour Course  NAEON. No beds available at [facility] is formed now. No complaint this morning    Vitals  Temp:  [36.5 C (97.7 F)-37.1 C (98.8 F)] 36.9 C (98.4 F)  Heart Rate:  [69-86] 76  *Resp:  [16-18] 17  BP: (95-118)/(60-82) 99/68  SpO2:  [95 %-97 %] 97 %    MostRecent Weight: 93 kg (205 lb 0.4 oz)  Admission Weight: 79.6 kg (175 lb 7.8 oz)      Intake/Output Summary (Last 24 hours) at 01/12/2022 1548  Last data filed at 01/12/2022 1511  Gross per 24 hour   Intake 3359 ml   Output 2150 ml   Net 1209 ml     Pain Score: 0    Physical Exam  Vitals reviewed.   Constitutional:       Appearance: She is ill-appearing.   HENT:      Head: Normocephalic and atraumatic.      Nose: Nose normal.      Mouth/Throat:      Mouth: Mucous membranes are moist.      Pharynx: Oropharynx is clear.   Eyes:      General: Scleral icterus present.      Extraocular Movements: Extraocular movements intact.      Pupils: Pupils are equal, round, and reactive to light.   Cardiovascular:      Rate and Rhythm: Normal rate and regular rhythm.   Pulmonary:      Effort: Pulmonary effort is normal. No respiratory distress.   Abdominal:      General: Abdomen is flat. There is no distension.      Palpations: Abdomen is soft. There is no shifting dullness or fluid wave.      Tenderness: There is no abdominal tenderness.   Musculoskeletal:      Cervical back: Normal range of motion and neck supple.      Right lower leg: No edema.      Left lower leg: No edema.   Skin:     General: Skin is warm and dry.      Coloration: Skin is jaundiced.   Neurological:      Mental Status: She is alert and oriented to person, place, and time.   Psychiatric:         Mood and Affect: Mood normal.         Behavior: Behavior is cooperative.     Scheduled Meds:   0.9% sodium chloride flush  3 mL Intravenous Q12H [place]    cholecalciferol (vitamin  D3)  4,000 Units Oral Daily [place]    clotrimazole   Topical BID SCH    dicyclomine  20 mg Oral 4x Daily [place]    dry mouth oral rinse  5 mL Mucous Membrane 4x Daily [place]    melatonin  6 mg Oral Daily At Bedtime [facility]    multivitamin complete chewable  1 tablet Oral Daily [place]    QUEtiapine  50 mg Oral Once    rifAXIMin  550 mg Oral BID [place]    thiamine  200 mg Intravenous Q24H    ursodiol  600 mg Oral BID [facility]    valGANciclovir  450 mg Oral BID [place]    vitamin E (dl, acetate)  800 Units Oral Daily [place]     Continuous Infusions:   dextrose 5 %  and 0.9 % sodium chloride 75 mL/hr (01/12/22 0624)     PRN Meds:   0.9% sodium chloride flush  3 mL Intravenous PRN    albuterol  2.5 mg Nebulization Q4H PRN    Or    albuterol  2.5 mg Nebulization Q4H PRN    aluminum-magnesium hydroxide-simethicone  30 mL Oral Q8H PRN    HYDROmorphone  0.3-0.8 mg Intravenous Q4H PRN    hydrOXYzine  10 mg Oral Q8H PRN    loperamide  4 mg Oral 4x Daily PRN    magnesium sulfate in dextrose 5 %  2-4 g Intravenous Daily PRN    Or    magnesium sulfate in water  2-4 g Intravenous Daily PRN    ondansetron  8 mg Intravenous Q8H PRN    polyethylene glycol  17 g Oral Daily PRN    potassium chloride in sterile water  20-80 mEq Intravenous Daily PRN    Or    potassium chloride in sterile water  20-80 mEq Intravenous Daily PRN    Or    potassium chloride  20-80 mEq Oral Daily PRN    simethicone  80 mg Oral BID PRN     Data    CBC        01/12/22  0342 01/11/22  0441   WBC 3.3* 4.5   HGB 8.5* 8.6*   HCT 25.7* 26.1*   PLT 47* 45*     Coags        01/12/22  0342 01/11/22  0441   INR 1.5* 1.5*     Chem7        01/12/22  0342 01/11/22  1724 01/11/22  0441   NA 143 143 141   K 3.0* 3.1* 3.7   CL 114* 113* 114*   CO2 _0 BUN 3* 3* 3*   CREAT 0.35* 0.37* 0.37*   GLU 101 100 116     Electrolytes        01/12/22  0342 01/11/22  1724 01/11/22  0441   CA 7.4* 7.6* 7.4*   MG 1.9  --  2.1     Liver Panel        01/12/22  0342 01/11/22  0441   AST  85* 78*   ALT 71* 63*   ALKP 327* 334*   TBILI 10.6* 10.5*   TP 3.1* 3.1*   ALB 1.1* 1.1*     Lactate  No results found in last 36 hours    Microbiology Results (last 24 hours)     Procedure Component Value Units Date/Time    COVID-19 RNA, RT-PCR/Nucleic Acid Amplification RETEST (Asymptomatic and No Specific COVID-19 Suspicion); No; No; No [[id number]] Collected: 01/11/22 1724    Order Status: Completed Specimen: Not Applicable from Bilateral Anterior Nares +/- OP Swab Updated: 01/12/22 0248     COVID-19 RNA, RT-PCR/Nucleic Acid Amplification Not detected     Comment: (Reported by Lab to Public Health.)  (Results reported to [facility])          Comments See Comment     Comment: in universal transport medium           Radiology Results  No results found.     I discussed the patient with Melanee Left, MD from [facility] regarding A&P.    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 The patient has received VRd X 4 (achieving VGPR) then Mel 200 ASCT  05/12/11. 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 She met the criteria for PD on 02/19/2020 and was started on third line DARA PD in June 2021, which was recently held on 10/20/21 due to chronic diarrhea. Patient was transferred OSH after recent hypovolemic/distributive shock. Here for acute encephalopathy workup and acute liver failure workup.      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   June 2020 due to progression of M spike Revlimid was increased to 15 mg PO D1-21 every 28 days, plus dexamethasone  July 2021: started daratumumab, Pomylast 2 mg D 1-21, Q28 days, dexamethasone 12 mg PO Q weekly.   Primary oncologist: Dr. Iona Coach,  Dr. Kennon Rounds Thermal hematology  Receiving  dexamethasone 12 mg weekly for diarrhea  Receiving MT with daratumumab with pomalyst and dex, most recent dose 10/16/21, has since been on hold due to worsening diarrhea.     DATA:   09/07/2010: IgG Kappa MM  12/02/2021: Ig M <5, IgG 447, IgA 20, Ig E <2, SFLC Kappa 16.6, Lambda 1.6, Kappa/Lamda 10.38  12/14/21: IgG K on IFE, IgG 430, KLC 16.6, LLC 2.7, KLR 6.15    Chemo:   Holding Daratumumab and Pomalyst, last dose 10/20/21, given recent infection and acute liver failure.    Supportive Care:   Transfusion: Keep Hg >7, and Platelets >10.   Line: PICC placed 1/26    Outpatient Oncologist: Dr. Kennon Rounds    Dispo: Given improvement in diarrhea and slowly improving Tbili (will take weeks to months per hepatology), plan to transfer back to prior OSH once bed available    # Immunocompromised  # CMV colitis  # At risk for infections due to malignancy  # Shock, likely hypovolemic vs distributive in the setting of chronic diarrhea, + norovirus infection   # Acute on chronic diarrhea, norovirus positive., diff and OP negative.   -No prior evidence of adrenal insufficiency, AM cortisol levels normal, previously on dexamethasone 8 mg PO Qweekly, but has been held since early December.    Data:  - CT brain: No acute intracranial hemorrhage, herniation, or hydrocephalus.  - CT chest/A/P pending: Medium-sized bilateral pleural effusions with bilateral peribronchovascular groundglass opacities, suggestive of pulmonary edema. Clustered nodules in right middle lobe along the right minor fissure, which could represent mild aspiration or infection. Numerous sclerotic and mixed lytic/sclerotic lesions throughout the thoracic spine and bilateral ribs, increased in size and number compared to prior. Appearance is atypical for multiple myeloma but may represent a combination of treated, partially treated, and active disease. No acute pathologic fracture. Findings compatible with enterocolitis of infectious or inflammatory etiology.  Involvement of the colon is greater than of the small bowel. Marked hepatic steatosis. Increased sclerosis of bone lesions throughout the thoracolumbar spine, bony pelvis, and proximal left femur. Appearance is atypical for multiple myeloma but may represent a combination of treated, partially treated, and active disease. No acute pathologic fracture.  - 1/29:CMV PCR [id number]  - 1/31: GI PCR panel positive only for Norovirus.   2/1: LP results: Neutrophils 6% in tube 1, Lymphocytes 88% (predominant), protein 42 (normal), glucose 36 (low), rapid HSV in CSF negative. CMV CSF PCR negative    - 2/2 CMV PCR 179,555High   - 2/2  Flex Sig Biopsies prelim: "colonic mucosa with a rare small non-necrotizing mucosal granuloma and rare CMV immunolabeling"   - 2/5 [place] work up: sIL2 (1556.6 high), ferritin (948 high), TG(258 high), NKA-IFN? 10 (WNL)  - 2/8: CMV PCR: 3107 (sig improvement)  - 2/10: C diff: negative.  - 2/12: Repeat GI viral PCR: positive for Norovirus  - 2/15 CMV 755  - 2/17 IgG 745  - 2/22: Repeat GI viral PCR positive for Norovirus    - 2/22 CMV 74    Plan:  - appreciate ID recs  -- Ganciclovir 5 mg/kg (1/31-2/21). S/p one dose of IVIG on 2/7.   -- S/p Valcyte 900 mg BID (2/21 -> 2/23) -> transitioned to maintenance Valcyte 433m BID   -- S/p Alinia (nitazoxanide) for 7 days (2/16 - 2/22) for norovirus treatment. Repeat GI viral PCR was persistently positive for Norovirus on 2/22, but ID said it is likely from colonization. No need to treat further  -- s/p IV Zosyn 4.5 g Q8hrs (1/26-1/28, restarted 1/29 - 2/1 for recurrent fever)  -- GI consult: appreciate recs: flex sig 2/2  -- Weekly CMV  -- Rifaximin for hepatic encephalopathy and SIBO as below     # Acute liver injury of unclear etiology  # Acute on chronic encephalopathy likely 2/2 infectious vs hepatic vs metabolic  # Hyperbilirubinemia  # Anasarca  # Hypoalbuminemia    Data:   -Steady rise in LFTs in 05/2021,  -12/02/21: AST 152--> 121, ALT 130-->  110, TB 5.4, Albumin 1.1  -Hepatitis panel negative.   -1/26:  Tbili 14.6, GGT 1160, ammonia 25  -CTAP 12/02/21: Scattered sclerotic lesions in the lumbar spine, hepatic steatosis.   -RUQ ultrasound: liver enlarged with diffuse increased echogenicity, consistent with fatty infiltration. 2cm stone in the gallbladder.   -HIDA scan: Normal radiotracer filling of the gallbladder is identified, therefore the exam is not consistent with cystic duct obstruction or acute cholecystitis.  - [place] 1/27: Hepatomegaly with diffusely increased echogenicity which can be seen with hepatic amyloidosis or steatosis. Please refer to the recently performed biopsy for further details. Mild perihepatic ascites. Patent hepatic vasculature with normal waveforms. Cholelithiasis without acute cholecystitis. Right pleural effusion.  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 CMV stains: Negative.  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 Could be drug related. Increase Ursodiol to 600 mg BID, Check Hepatitis E. 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 Unlikely diarrhea from Ursodiol.  - Avoiding steroids  - Daily LFT/INR  - RD consult placed to expand diet to more than protein shakes.  -- Family requesting appetite  stimulant but risk > benefit.     # Acute toxic encephalopathy, improving  In the setting of liver failure, ammonia only 25. LP performed 2/1 to r/o encephalitis.     Data:  1/31: HIV, TSH, Folate, B12 WNL  1/30: CT head WNL other than sinus disease  2/1: LP results: Neutrophils 6% in tube 1, Lymphocytes 88% (predominant), protein 42 (normal), glucose 36 (low), rapid HSV in CSF negative. CMV CSF PCR negative  2/5: Brain MRI: Smooth diffuse dural thickening and enhancement as well as bilateral holohemispheric subdural effusions, which may be on the basis of diminished intracranial pressure following recent lumbar puncture.  -2/8: EEG: non-specific mild cerebral dysfunction.  -2/9: CT Brain: similar to prior MRI with hyperdense areas likely hygromas    Plan:  - Neurology consult: 2/7: Encephalopathy likely from underlying medical disease. Delirium prevention. Avoid Ativan and try Seroquel for delirium. MRI findings is from LP. Okay to try caffiene tablets in the daytime to help with postural headache but patient is allergic to it.  -2/8: Repeat CT head to monitor the status of subdural effusion: stable, EEG: diffuse slowing, Thiamine iv prophylaxis.  -Speech path: Regular and thin liquid. Slightly higher risk for aspiration given encephalopathy.  - Start empiric rifaximin given possibility of HE without elevated ammonia (2/14 ->)  -- Rifaximin dose adjusted to cover for SIBO, then de-escalated back to HE coverage on 2/22   -- holding off on lactulose given diarrhea    # Deconditioning  Pt has not been engaging much with PT/OT despite improved mental status. May be due in part to anxieties about diarrhea/incontinence.  - continue PT/OT  - continue encouragement to participate, particularly by family/familiar faces    # Bloating  May be due to indigestion. Denies constipation. Exam not c/w significant ascites.  - mylanta, simethicone prn    # Hypokalemia:  # Hypomagnesemia:  Replete as needed.    # Vitamin D  deficiency:  -Supplements ordered.    # Moderate malnutrition:  -Seen by RD and given recs for supplements.    # History of PE, diagnosed in April 2019  - hold home [place] for now given recent anemia and concerns for bleeding.   - monitor for signs and symptoms of worsening bleeding.     # Elevated TSH at OSH, likely 2/2 subclinical hypothyroidism  -TSH WNL on 12/15/20.      # Anxiety, emotional distress:   -Patient has stressful social situation, intellectually disabled son, and regarding underlying disease process.     # Chronic buttocks pressure wound  - Wound care consult, appreciate recs  - Position changes as tolerated, waffle cushion, etc  -- Pressure wound ruled out, treating bilateral buttocks moisture and friction damage   - Rectal tube placed 2/18 to avoid contamination of wounds by stool    Wound care:  - Bilateral Groin/Thighs   Gently cleanse with warm water & Remedy no-rinse foam (pH-balanced cleanser) with soft cloths, pat dry ensuring   thoroughly dried.   Do not use CHG to rash areas.   Apply Clotrimazole ointment to rash BID, ensure thoroughly rubbed in.   Apply a piece of Interdry textile [phone]) leaving 2 inches exposed from fold to facilitate wicking.     - Bilateral Buttocks   First rinse with perineal irrigation bottle & warm water to remove as much stool as possible to minimize wiping.   Cleanse wounds & surrounding skin with Anasept wound cleanser spray (XYV#[id number]), pat dry with sterile gauze.   Apply a thick layer of Triad hydrophilic wound dressing paste (KMQ#[id number]) over wounds & periwound areas.   Promptly clean patient after episodes of incontinence using method described.   Remove only the top, soiled  layer of Triad during incontinence cleaning to avoid further eroding the skin & apply another top   layer of Triad following cleaning.   Apply Triad at least BID & PRN to keep area protected.     - Pressure Injury Prevention   Continue q2h repositioning- 30 degree lateral turn  to L & R only unless medically contraindicated to avoid direct pressure on   sacrum.   Place Waffle mattress overlay [phone]) under patient in bed to assist with pressure redistribution.   Use waffle cushion while OOB to chair, reposition frequently while in chair every 30 minutes.   Additionally, float heels completely off of bed. If pillows do not suffice, order heel offloading boots (PMM# [id number]).      # VTE PPx:  Contraindicated: Other: anticipate Plt <50    Severity of Illness  Stable     During this hospitalization the patient is also being treated for:  - Sepsis (present on admission)    - Malignancy associated fatigue on admission  - Neoplasm/malignancy associated pain  - Hypoalbuminemia (present on admission)  - Fluid status: Hypovolemia on admission     - Cachexia  - Encephalopathy: Metabolic encephalopathy   - Pancytopenia 2/2 chemotherapy    Code Status: Stebbins, MD  01/12/22

## 2022-01-12 NOTE — Consults (Signed)
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 The patient has received VRd X 4 (achieving VGPR) then Mel 200 ASCT 05/12/11. 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 She met the criteria for PD on 02/19/2020 and was started on third line DARA PD in June 2021, which was recently held on 10/20/21 due to chronic diarrhea. Patient was transferred OSH after recent hypovolemic/distributive shock. Here for acute encephalopathy workup and acute liver failure workup.    Assessment and Treatment Plan    OT Progress summary: OT attempted session in am. She was A&O x 3 and very adamant about not sitting EOB or sling OOB to hoyer. OT retunred later in day and assisted with bedlevel pericare as pt was incontinent of bowel in bed. She requried mod/max A for rolling in bed due to BLE edema and gross weakness and was max A for pericare. Skin breakdown noted on buttocks. Pt was then slung OOB to chair. She reported discomfort in chair but would not accept any adjustments to help. She requested to return to bed.  OT educated on importance of OOB each day to maintain strength and engage in activities. Pt refused additional activities once in chair and kept eyes shut.  RN was at bedside during entire session and aware of bed sore. Chair had cushion and lap belt was applied. Pt would benefit form ongoing OT in house to maximize functional progression and participation. OT rec- placement  Progress with OT: No progress;Limited progress due to patient's cognition;Limited progress due to impaired compliance with therapy recommendations  Current maximal level of assist: Dependent assist  Next session focus: t/f OOB, ADLs    Recommendations    Recommended Discharge Disposition Placement for continued therapy   Discharge DME Recommendations Defer to next level of care;To be determined   Equipment  Recommendations     DME Comment     Discharge Recommendations Comment     Rehab Potential Patient participates well in therapy and is progressing towards goal;Unable to tolerate 3 hours of therapy a day   Anticipated Assistance Available at Discharge Family;Spouse/Significant other   Anticipated Type of Assistance Available at Discharge Assist as needed   Anticipated Time of Assistance Available at Discharge Unknown at this time   Barriers to Discharge Medical issues   Patient's current functional ability appropriate for D/C recommendations Yes     Inpatient Recommendations  OT Inpatient Recommendations Bed in chair position;Encourage participation in ADLs;Delirium precautions   Recommendation Comments Sling to chair and 2p assist   Current Maximal Level of Assist Needed Dependent assist     Brace/Precautions   Brace/Orthotic/Prosthetic:No  Precautions:Has weight bearing limitation or precaution: Yes  Precautions and weight bearing status comments: delrium, falls, skin     Subjective Report:"I want to go back to bed."    Patient/Family Goal:     Objective Findings and Interventions    Areas of Occupation    Grooming and Light Hygiene     Cueing Required     Comment pt refused   Self Feeding     Cueing Required     Comment     Toileting Maximal assistance   Cueing Required     Toileting Clothing Management     Cueing Required     Comment max A for incontinence in bed.  Mod/max to roll in bed  Upper Body Dressing     Cueing Required     Comment     Lower Body Dressing     Cueing Required     Comment     Upper Body Bathing     Cueing Required     Comment     Lower Body Bathing     Cueing Required     Comment     ADL/IADL Comment         Functional Transfers    Bed Mobility From     Bed Mobility To     Level of Assist     Cueing Required     Transfer From Bed   Transfer To Chair   Level of Assist Dependent   Cueing Required     Technique     Functional Transfer Comment hoyer OOB to chair. Refused additional activity          [employer] for ADLs  6 Click Score: 11    Client Factors     Cognition comment:A&O x person, [place], month, year, situation. Behavioral interference    Problem solving;Safety awareness;Information processing  Executive functions comment:                                                Psychosocial comment (coping, adherence): poor coping with activity and fatigue    OT Education  Education  Learner: Patient;Caregiver  Content: Activity recommendations  Response: Verbalizes understanding;Demonstrates understanding;Needs reinforcement  Communication between other health care provider: RN  Communication comment: patient's status    OT Plan of Care              Patient/Caregiver Agreeable to OT Plan of Care: Yes    Helmut Muster, OT  01/12/2022 3:38 PM

## 2022-01-12 NOTE — Plan of Care (Addendum)
Patient slept for the most part of the shift, refused CHG bath this morning. Education provided regarding the importance of CHG but she is adamant to go back to sleep for now and just do it later this morning. Potassium level this morning is 3.0, per protocol, needs 80 meq to run for 4 hours. 40 Meq of KCl started for now to run for 2 hours.             Problem: Discharge Planning - Adult  Goal: Knowledge of and participation in plan of care  Outcome: Progress within 12 hours     Problem: Fall, at Risk or Actual - Adult  Goal: Absence of falls and fall related injury  Outcome: Progress within 12 hours     Problem: Delirium - Adult / Pediatric  Goal: Absence or resolution of delirium  Outcome: Progress within 12 hours     Problem: Pain,  Acute / Chronic- Adult  Goal: Control of chronic pain  Outcome: Progress within 12 hours

## 2022-01-13 LAB — BASIC METABOLIC PANEL (NA, K, CL, CO2, BUN, CR, GLU, CA)
Anion Gap: 4 (ref 4–14)
Calcium, total, Serum / Plasma: 7.5 mg/dL — ABNORMAL LOW (ref 8.4–10.5)
Carbon Dioxide, Total: 24 mmol/L (ref 22–29)
Chloride, Serum / Plasma: 115 mmol/L — ABNORMAL HIGH (ref 101–110)
Creatinine: 0.34 mg/dL — ABNORMAL LOW (ref 0.55–1.02)
Glucose, non-fasting: 87 mg/dL (ref 70–199)
Potassium, Serum / Plasma: 3.8 mmol/L (ref 3.5–5.0)
Sodium, Serum / Plasma: 143 mmol/L (ref 135–145)
Urea Nitrogen, Serum / Plasma: 3 mg/dL — ABNORMAL LOW (ref 7–25)
eGFRcr: 117 mL/min/{1.73_m2} (ref >59)

## 2022-01-13 LAB — COMPREHENSIVE METABOLIC PANEL (BMP, AST, ALT, T.BILI, ALKP, TP ALB)
AST: 76 U/L — ABNORMAL HIGH (ref 5–44)
Alanine transaminase: 70 U/L — ABNORMAL HIGH (ref 10–61)
Albumin, Serum / Plasma: 1.1 g/dL — ABNORMAL LOW (ref 3.4–4.8)
Alkaline Phosphatase: 335 U/L — ABNORMAL HIGH (ref 38–108)
Anion Gap: 5 (ref 4–14)
Bilirubin, Total: 9.5 mg/dL — ABNORMAL HIGH (ref 0.2–1.2)
Calcium, total, Serum / Plasma: 7.1 mg/dL — ABNORMAL LOW (ref 8.4–10.5)
Carbon Dioxide, Total: 25 mmol/L (ref 22–29)
Chloride, Serum / Plasma: 114 mmol/L — ABNORMAL HIGH (ref 101–110)
Creatinine: 0.33 mg/dL — ABNORMAL LOW (ref 0.55–1.02)
Glucose, non-fasting: 79 mg/dL (ref 70–199)
Potassium, Serum / Plasma: 3.1 mmol/L — ABNORMAL LOW (ref 3.5–5.0)
Protein, Total, Serum / Plasma: 3.2 g/dL — ABNORMAL LOW (ref 6.3–8.6)
Sodium, Serum / Plasma: 144 mmol/L (ref 135–145)
Urea Nitrogen, Serum / Plasma: 3 mg/dL — ABNORMAL LOW (ref 7–25)
eGFRcr: 118 mL/min/{1.73_m2} (ref >59)

## 2022-01-13 LAB — COMPLETE BLOOD COUNT WITH DIFFERENTIAL
Abs Basophils: 0 10*9/L (ref 0.00–0.10)
Abs Eosinophils: 0 10*9/L (ref 0.00–0.40)
Abs Imm Granulocytes: 0.03 10*9/L (ref <0.10)
Abs Lymphocytes: 0.2 10*9/L — ABNORMAL LOW (ref 1.00–3.40)
Abs Monocytes: 0.28 10*9/L (ref 0.20–0.80)
Abs Neutrophils: 2.27 10*9/L (ref 1.80–6.80)
Hematocrit: 27 % — ABNORMAL LOW (ref 36.0–46.0)
Hemoglobin: 8.3 g/dL — ABNORMAL LOW (ref 12.0–15.5)
MCH: 34.7 pg — ABNORMAL HIGH (ref 26.0–34.0)
MCHC: 30.7 g/dL — ABNORMAL LOW (ref 31.0–36.0)
MCV: 113 fL — ABNORMAL HIGH (ref 80–100)
MPV: 13.4 fL — ABNORMAL HIGH (ref 9.1–12.6)
Platelet Count: 45 10*9/L — ABNORMAL LOW (ref 140–450)
RBC Count: 2.39 10*12/L — ABNORMAL LOW (ref 4.00–5.20)
RDW-CV: UNDETERMINED % (ref 11.7–14.4)
WBC Count: 2.8 10*9/L — ABNORMAL LOW (ref 3.4–10.0)

## 2022-01-13 LAB — BILIRUBIN, DIRECT: Bilirubin, Direct: 7.1 mg/dL — ABNORMAL HIGH (ref <0.6)

## 2022-01-13 LAB — MAGNESIUM, SERUM / PLASMA: Magnesium, Serum / Plasma: 2 mg/dL (ref 1.6–2.6)

## 2022-01-13 LAB — FIBRINOGEN, FUNCTIONAL: Fibrinogen, Functional: 160 mg/dL — ABNORMAL LOW (ref 202–430)

## 2022-01-13 LAB — PROTHROMBIN TIME
INR: 1.4 — ABNORMAL HIGH (ref 0.9–1.2)
PT: 16.6 s — ABNORMAL HIGH (ref 11.6–15.0)

## 2022-01-13 MED FILL — POTASSIUM CHLORIDE 40 MEQ/100ML IN STERILE WATER INTRAVENOUS PIGGYBACK: 40 mEq/100 mL | INTRAVENOUS | Qty: 100

## 2022-01-13 MED FILL — DICYCLOMINE 10 MG CAPSULE: 10 mg | ORAL | Qty: 2

## 2022-01-13 MED FILL — GAS RELIEF 80 (SIMETHICONE) 80 MG CHEWABLE TABLET: 80 mg | ORAL | Qty: 1

## 2022-01-13 MED FILL — FLINTSTONES COMPLETE (IRON) CHEWABLE TABLET: ORAL | Qty: 1

## 2022-01-13 MED FILL — THIAMINE HCL (VITAMIN B1) 100 MG/ML INJECTION SOLUTION: 100 mg/mL | INTRAMUSCULAR | Qty: 2

## 2022-01-13 MED FILL — XIFAXAN 550 MG TABLET: 550 mg | ORAL | Qty: 1

## 2022-01-13 MED FILL — MELATONIN 1 MG TABLET: 1 mg | ORAL | Qty: 1

## 2022-01-13 MED FILL — VALGANCICLOVIR 450 MG TABLET: 450 mg | ORAL | Qty: 1

## 2022-01-13 MED FILL — MAGNESIUM SULFATE 2 GRAM/50 ML (4 %) IN WATER INTRAVENOUS PIGGYBACK: 2 gram/50 mL (4 %) | INTRAVENOUS | Qty: 50

## 2022-01-13 MED FILL — URSODIOL (BULK) 100 % POWDER: 100 % | Qty: 0.6

## 2022-01-13 MED FILL — VITAMIN E (DL, ACETATE) 45 MG (100 UNIT) CAPSULE: 45 mg (100 unit) | ORAL | Qty: 8

## 2022-01-13 MED FILL — CLOTRIMAZOLE 1 % TOPICAL CREAM: 1 % | TOPICAL | Qty: 15

## 2022-01-13 MED FILL — CHOLECALCIFEROL (VITAMIN D3) 25 MCG (1,000 UNIT) TABLET: 1000 UNITS | ORAL | Qty: 4

## 2022-01-13 MED FILL — MAG-AL PLUS 200 MG-200 MG-20 MG/5 ML ORAL SUSPENSION: 200-200-20 mg/5 mL | ORAL | Qty: 30

## 2022-01-13 MED FILL — ANTI-DIARRHEAL (LOPERAMIDE) 2 MG TABLET: 2 mg | ORAL | Qty: 2

## 2022-01-13 MED FILL — QUETIAPINE 25 MG TABLET: 25 mg | ORAL | Qty: 2

## 2022-01-13 NOTE — Consults (Signed)
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 The patient has received VRd X 4 (achieving VGPR) then Mel 200 ASCT 05/12/11. 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 She met the criteria for PD on 02/19/2020 and was started on third line DARA PD in June 2021, which was recently held on 10/20/21 due to chronic diarrhea. Patient was transferred OSH after recent hypovolemic/distributive shock. Here for acute encephalopathy workup and acute liver failure workup    ASSESSMENT   Patient presents with impaired strength, balance, and activity tolerance. During this hospital stay, she has spent most of her time in bed. Patient required Min-Mod A to roll L and R to place sling underneath in order to dependently transfer to chair. Once seated in the chair, patient was unable to scoot back in the chair with Max A via lateral weight shifts L and R, and the ceiling lift was used a 2nd time to reposition patient back in the chair. Patient's functional mobility progress is dependent upon her participation. She will benefit from continued PT to decrease caregiver burden with functional mobility.    RECOMMENDATIONS  DISCHARGE RECOMMENDATION  Comments Placement with ongoing PT needs      Patient Current Functional Status Sufficient for PT Discharge Recommendation Yes   Discharge DME needs TBD pending patient progress.   Discharge transportation needs [place]  Inpatient Rehab Assistive Device Recommendation Vertical dependent lift;Ceiling lift   Activity Recommendation Bed in chair position, OOB to chair with ceiling lift     PRIOR LEVEL OF FUNCTION  Prior Living Environment: Pt lives in [address] [facility] with children in [place]. Bedroom on 2nd story. Has stairs to enter.  Available equipment or existing home modifications: No DME.  Prior functional limitations: Is normally independent with ambulation and  ADLs. Prior to OSH admit on 1/18, patient mostly in bed at home.  Social supports: Daughter  Community access:    Fall History: No  Other comments: From initial eval and chart review..    SUBJECTIVE  Subjective report:  At 1115. Patient states that she is in the chair. Inforrmed patient that she is in bed, and that PT will return at 2 pm to help her out of bed.  Patient goals:  "To relax"  Notable observations:  Supine in bed with HOB elevated at beginning of session. Seated in chair with sling underneath and safety belt to front. RN in room.    SYSTEMS REVIEW  Cognition/Communication  Delirium screen:  Yes Delirium Screen:   Details:  Encephalopathic    Communication  Cognition/communication impaired:  Yes     Cognition: Decreased insight to deficits  Behavior: Self-limiting  Behavior comment: Behavioral    Integumentary  Integumentary deficits:  YesIntegumentary deficit detail:  Jaundiced, edematous, wounds at buttocks (not observed), foley    Cardiopulmonary  Cardiopulmonary deficits:  Yes  Detail:  Decreased activity tolerance    Neuromuscular  Neuromuscular deficits:  Yes  Motor control or coordination: Impaired at bilateral lower extremities >> bilateral upper extremities        Pain     Currently in pain: Yes  Pain location: Reports pain/discomfort at back sitting in chair.          COMPREHENSIVE MOVEMENT ANALYSIS/TREATMENT  Precautions/WB status: Yes  Precautions and weight bearing status comments: delrium, falls, skin      Functional  Mobility  Requires second person/additional health care providers: Yes  Type additional health care provider: RN  Bed mobility Current Initial    Rolling  Level of assist  Moderate assist;Minimal assist  Maximal assist (12/15/21 0932)   Device  Log roll  Log roll (12/15/21 0932)   Intervention     Supine < > Sit  Level of assist   Dependent assist;Two person assist (12/15/21 0932)   Device   Through long sitting;Bed rail;Head of bed elevated (12/13/21 1430)   Intervention    Verbal cues;Tactile cues;External cues (12/13/21 1430)   Rolling comments:  Min-Mod A rolling L and R to place sling for dependent transfer  Supine<>sit comments:      Transfer Current Initial   Sit < > Stand  Level of assist   Maximal assist;Two person assist (12/16/21 0930)   Device     Intervention     Transfer  Level of assist  From: Bed  To: Chair  Dependent assist  Dependent assist (12/18/21 1962)   Device  Vertical dependent lift  Vertical dependent lift (12/18/21 2297)   Intervention   Verbal cues;Tactile cues (12/18/21 0938)   Sit < > Stand comments:   Transfer comments:   Extensive education on importance of OOB activity. Ceiling lift to chair. Unable to scoot back into chair with Max A via lateral weight shifts L and R. Use of ceiling lift to reposition patient in chair. Patient wanting her LEs elevated while in chair.      Balance  Balance deficits noted:    Exercise/other interventions  Supine exercises: Ankle pumps  Sitting exercises: Ankle pumps  Sitting exercises comment (other exercise, details): In chair    Communication between other health care providers: RN  Communication comment:      Education assessment  Learner: Patient, Caregiver  Content: Activity recommendations  Response: Needs reinforcement    Outcome measures   Physical Therapist Global Assessment of Mobility  Activity Achieved: Passive transfer to chair/commode  AMPAC 6-clicks basic mobility score: 9      PLAN  PT frequency:  3x/week  PT duration:  4 weeks.  Comment: POC expires 02/10/2022    GOALS  Flowsheet Rows    Flowsheet Row Most Recent Value   Patient will perform supine< >sitting with Mod A   Patient will perform bed to chair transfers with Mod A using the least restrictive device   Patient will ambulate x50 feet with FWW and minA   Patient will demonstrate understanding of Pacing strategies, Home activity program, Precautions for mobility   Patient/cargiver will be independent Aerobic exercise program, Walking program    Additional goal Patient will propel a manual w/c 50 ft with Sup SBA.        Planned PT interventions:   Specific interventions: Progressive functional mobility training;Gait training;Balance training;NM Re-ed;Aerobic training;Ther ex  Education interventions: Caregiver training;Benefits of activity;Self-pacing/breathing;Exercise program;Mobility with precautions compliance;Fall risk reduction  Comment:      Leotis Shames, PT, DPT, NCS    01/13/2022

## 2022-01-13 NOTE — Progress Notes (Signed)
[facility] PROGRESS NOTE  ATTENDING ONLY     My date of service is 01/13/22.    24 Hour Course  NAEON.      Subjective  Said had one stool this morning, denies any other changes    Vitals  Temp:  [36.7 C (98.1 F)-37.3 C (99.1 F)] 37 C (98.6 F)  Heart Rate:  [68-86] 72  *Resp:  [14-20] 18  BP: (99-117)/(66-80) 104/66  SpO2:  [95 %-98 %] 95 %    MostRecent Weight: 95.1 kg (209 lb 10.5 oz)  Admission Weight: 79.6 kg (175 lb 7.8 oz)      Intake/Output Summary (Last 24 hours) at 01/13/2022 1249  Last data filed at 01/13/2022 0940  Gross per 24 hour   Intake 2418 ml   Output 1450 ml   Net 968 ml     Pain Score: Eyes closed, patient calm    Physical Exam  Vitals reviewed.   Constitutional:       Appearance: She is ill-appearing.   HENT:      Head: Normocephalic and atraumatic.      Nose: Nose normal.      Mouth/Throat:      Mouth: Mucous membranes are moist.      Pharynx: Oropharynx is clear.   Eyes:      General: Scleral icterus present.      Extraocular Movements: Extraocular movements intact.      Pupils: Pupils are equal, round, and reactive to light.   Cardiovascular:      Rate and Rhythm: Normal rate and regular rhythm.   Pulmonary:      Effort: Pulmonary effort is normal. No respiratory distress.   Abdominal:      General: Abdomen is flat. There is no distension.      Palpations: Abdomen is soft. There is no shifting dullness or fluid wave.      Tenderness: There is no abdominal tenderness.   Musculoskeletal:      Cervical back: Normal range of motion and neck supple.      Right lower leg: No edema.      Left lower leg: No edema.   Skin:     General: Skin is warm and dry.      Coloration: Skin is jaundiced.   Neurological:      Mental Status: She is alert and oriented to person, place, and time.   Psychiatric:         Mood and Affect: Mood normal.         Behavior: Behavior is cooperative.     Scheduled Meds:   0.9% sodium chloride flush  3 mL Intravenous Q12H [place]    cholecalciferol  (vitamin D3)  4,000 Units Oral Daily [place]    clotrimazole   Topical BID SCH    dicyclomine  20 mg Oral 4x Daily [place]    dry mouth oral rinse  5 mL Mucous Membrane 4x Daily [place]    melatonin  6 mg Oral Daily At Bedtime [facility]    multivitamin complete chewable  1 tablet Oral Daily [place]    QUEtiapine  50 mg Oral Once    rifAXIMin  550 mg Oral BID [place]    thiamine  200 mg Intravenous Q24H    ursodiol  600 mg Oral BID [facility]    valGANciclovir  450 mg Oral BID [place]    vitamin E (dl, acetate)  800 Units Oral Daily [place]     Continuous Infusions:   dextrose 5 %  and 0.9 % sodium chloride 75 mL/hr (01/13/22 0940)     PRN Meds:   0.9% sodium chloride flush  3 mL Intravenous PRN    albuterol  2.5 mg Nebulization Q4H PRN    Or    albuterol  2.5 mg Nebulization Q4H PRN    aluminum-magnesium hydroxide-simethicone  30 mL Oral Q8H PRN    HYDROmorphone  0.3-0.8 mg Intravenous Q4H PRN    hydrOXYzine  10 mg Oral Q8H PRN    loperamide  4 mg Oral 4x Daily PRN    magnesium sulfate in dextrose 5 %  2-4 g Intravenous Daily PRN    Or    magnesium sulfate in water  2-4 g Intravenous Daily PRN    ondansetron  8 mg Intravenous Q8H PRN    polyethylene glycol  17 g Oral Daily PRN    potassium chloride in sterile water  20-80 mEq Intravenous Daily PRN    Or    potassium chloride in sterile water  20-80 mEq Intravenous Daily PRN    Or    potassium chloride  20-80 mEq Oral Daily PRN    simethicone  80 mg Oral BID PRN     Data    CBC        01/13/22  0446 01/12/22  0342   WBC 2.8* 3.3*   HGB 8.3* 8.5*   HCT 27.0* 25.7*   PLT 45* 47*     Coags        01/13/22  0446 01/12/22  0342   INR 1.4* 1.5*     Chem7        01/13/22  0446 01/12/22  1633 01/12/22  0342   NA 144 143 143   K 3.1* 3.8 3.0*   CL 114* 115* 114*   CO2 25 24 24    BUN 3* 3* 3*   CREAT 0.33* 0.34* 0.35*   GLU 79 87 101     Electrolytes        01/13/22  0446 01/12/22  1633 01/12/22  0342   CA 7.1* 7.5* 7.4*   MG 2.0  --  1.9     Liver Panel        01/13/22  0446 01/12/22  0342    AST 76* 85*   ALT 70* 71*   ALKP 335* 327*   TBILI 9.5* 10.6*   TP 3.2* 3.1*   ALB 1.1* 1.1*     Lactate  No results found in last 36 hours    Microbiology Results (last 24 hours)     Procedure Component Value Units Date/Time    Cytomegalovirus DNA, Quantitative PCR, Plasma [[id number]] Collected: 01/13/22 0446    Order Status: Sent Specimen: Not Applicable from Serum Updated: 01/13/22 0543        Radiology Results  No results found.     I discussed the patient with Melanee Left, MD from [facility] regarding A&P.    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 The patient has received VRd X 4 (achieving VGPR) then Mel 200 ASCT 05/12/11. 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 She met the criteria for PD on 02/19/2020 and was started on third line DARA PD in June 2021, which was recently held on 10/20/21 due to chronic diarrhea. Patient was transferred OSH after recent hypovolemic/distributive shock. Here for acute encephalopathy workup and acute liver failure workup.      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   June 2020 due to progression of M spike Revlimid was increased to 15 mg PO D1-21 every 28 days, plus dexamethasone  July 2021: started daratumumab, Pomylast 2 mg D 1-21, Q28 days, dexamethasone 12 mg PO Q weekly.   Primary oncologist: Dr. Iona Coach,  Dr. Kennon Rounds [facility]  Receiving dexamethasone 12 mg weekly for diarrhea  Receiving MT with daratumumab with pomalyst and dex, most recent dose 10/16/21, has since been on hold due to worsening diarrhea.     DATA:   09/07/2010: IgG Kappa MM  12/02/2021: Ig M <5, IgG 447, IgA 20, Ig E <2, SFLC Kappa 16.6, Lambda 1.6, Kappa/Lamda 10.38  12/14/21: IgG K on IFE, IgG  430, KLC 16.6, LLC 2.7, KLR 6.15    Chemo:   Holding Daratumumab and Pomalyst, last dose 10/20/21, given recent infection and acute liver failure.    Supportive Care:   Transfusion: Keep Hg >7, and Platelets >10.   Line: PICC placed 1/26    Outpatient Oncologist: Dr. Kennon Rounds    Dispo: Given improvement in diarrhea and slowly improving Tbili (will take weeks to months per hepatology), plan to transfer back to prior OSH once bed available    # Immunocompromised  # CMV colitis  # At risk for infections due to malignancy  # Shock, likely hypovolemic vs distributive in the setting of chronic diarrhea, + norovirus infection   # Acute on chronic diarrhea, norovirus positive., diff and OP negative.   -No prior evidence of adrenal insufficiency, AM cortisol levels normal, previously on dexamethasone 8 mg PO Qweekly, but has been held since early December.    Data:  - CT brain: No acute intracranial hemorrhage, herniation, or hydrocephalus.  - CT chest/A/P pending: Medium-sized bilateral pleural effusions with bilateral peribronchovascular groundglass opacities, suggestive of pulmonary edema. Clustered nodules in right middle lobe along the right minor fissure, which could represent mild aspiration or infection. Numerous sclerotic and mixed lytic/sclerotic lesions throughout the thoracic spine and bilateral ribs, increased in size and number compared to prior. Appearance is atypical for multiple myeloma but may represent a combination of treated, partially treated, and active disease. No acute pathologic fracture. Findings compatible with enterocolitis of infectious or inflammatory etiology. Involvement of the colon is greater than of the small bowel. Marked hepatic steatosis. Increased sclerosis of bone lesions throughout the thoracolumbar spine, bony pelvis, and proximal left femur. Appearance is atypical for multiple myeloma but may represent a combination of treated, partially treated, and active disease. No acute  pathologic fracture.  - 1/29:CMV PCR 099,833ASNK  - 1/31: GI PCR panel positive only for Norovirus.   2/1: LP results: Neutrophils 6% in tube 1, Lymphocytes 88% (predominant), protein 42 (normal), glucose 36 (low), rapid HSV in CSF negative. CMV CSF PCR negative    - 2/2 CMV PCR 179,555High   - 2/2 Flex Sig Biopsies prelim: "colonic mucosa with a rare small non-necrotizing mucosal granuloma and rare CMV immunolabeling"   - 2/5 [place] work up: sIL2 (1556.6 high), ferritin (948 high), TG(258 high), NKA-IFN? 10 (WNL)  - 2/8: CMV PCR: 3107 (sig improvement)  - 2/10: C diff: negative.  - 2/12: Repeat GI viral PCR: positive for Norovirus  - 2/15 CMV 755  - 2/17 IgG 745  -  2/22: Repeat GI viral PCR positive for Norovirus    - 2/22 CMV 74    Plan:  - appreciate ID recs  -- Ganciclovir 5 mg/kg (1/31-2/21). S/p one dose of IVIG on 2/7.   -- S/p Valcyte 900 mg BID (2/21 -> 2/23) -> transitioned to maintenance Valcyte 434m BID   -- S/p Alinia (nitazoxanide) for 7 days (2/16 - 2/22) for norovirus treatment. Repeat GI viral PCR was persistently positive for Norovirus on 2/22, but ID said it is likely from colonization. No need to treat further  -- s/p IV Zosyn 4.5 g Q8hrs (1/26-1/28, restarted 1/29 - 2/1 for recurrent fever)  -- GI consult: appreciate recs: flex sig 2/2  -- Weekly CMV  -- Rifaximin for hepatic encephalopathy and SIBO as below     # Acute liver injury of unclear etiology  # Acute on chronic encephalopathy likely 2/2 infectious vs hepatic vs metabolic  # Hyperbilirubinemia  # Anasarca  # Hypoalbuminemia    Data:   -Steady rise in LFTs in 05/2021,  -12/02/21: AST 152--> 121, ALT 130--> 110, TB 5.4, Albumin 1.1  -Hepatitis panel negative.   -1/26:  Tbili 14.6, GGT 1160, ammonia 25  -CTAP 12/02/21: Scattered sclerotic lesions in the lumbar spine, hepatic steatosis.   -RUQ ultrasound: liver enlarged with diffuse increased echogenicity, consistent with fatty infiltration. 2cm stone in the gallbladder.   -HIDA scan:  Normal radiotracer filling of the gallbladder is identified, therefore the exam is not consistent with cystic duct obstruction or acute cholecystitis.  - [place] 1/27: Hepatomegaly with diffusely increased echogenicity which can be seen with hepatic amyloidosis or steatosis. Please refer to the recently performed biopsy for further details. Mild perihepatic ascites. Patent hepatic vasculature with normal waveforms. Cholelithiasis without acute cholecystitis. Right pleural effusion.  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 CMV stains: Negative.  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 Could be drug related. Increase Ursodiol to 600 mg BID, Check Hepatitis E. 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 Unlikely diarrhea from Ursodiol.  - Avoiding steroids  - Daily LFT/INR  - RD consult placed to expand diet to more than protein shakes.  -- Family requesting appetite stimulant but risk > benefit.     # Acute toxic encephalopathy, improving  In the setting of liver failure, ammonia only 25. LP performed 2/1 to r/o encephalitis.     Data:   1/31: HIV, TSH, Folate, B12 WNL  1/30: CT head WNL other than sinus disease  2/1: LP results: Neutrophils 6% in tube 1, Lymphocytes 88% (predominant), protein  42 (normal), glucose 36 (low), rapid HSV in CSF negative. CMV CSF PCR negative  2/5: Brain MRI: Smooth diffuse dural thickening and enhancement as well as bilateral holohemispheric subdural effusions, which may be on the basis of diminished intracranial pressure following  recent lumbar puncture.  -2/8: EEG: non-specific mild cerebral dysfunction.  -2/9: CT Brain: similar to prior MRI with hyperdense areas likely hygromas    Plan:  - Neurology consult: 2/7: Encephalopathy likely from underlying medical disease. Delirium prevention. Avoid Ativan and try Seroquel for delirium. MRI findings is from LP. Okay to try caffiene tablets in the daytime to help with postural headache but patient is allergic to it.  -2/8: Repeat CT head to monitor the status of subdural effusion: stable, EEG: diffuse slowing, Thiamine iv prophylaxis.  -Speech path: Regular and thin liquid. Slightly higher risk for aspiration given encephalopathy.  - Start empiric rifaximin given possibility of HE without elevated ammonia (2/14 ->)  -- Rifaximin dose adjusted to cover for SIBO, then de-escalated back to HE coverage on 2/22   -- holding off on lactulose given diarrhea    # Deconditioning  Pt has not been engaging much with PT/OT despite improved mental status. May be due in part to anxieties about diarrhea/incontinence.  - continue PT/OT  - continue encouragement to participate, particularly by family/familiar faces    # Bloating  May be due to indigestion. Denies constipation. Exam not c/w significant ascites.  - mylanta, simethicone prn    # Hypokalemia:  # Hypomagnesemia:  Replete as needed.    # Vitamin D deficiency:  -Supplements ordered.    # Moderate malnutrition:  -Seen by RD and given recs for supplements.    # History of PE, diagnosed in April 2019  - hold home [place] for now given recent anemia and concerns for bleeding.   - monitor for signs and symptoms of worsening bleeding.     # Elevated TSH at OSH, likely 2/2 subclinical  hypothyroidism  -TSH WNL on 12/15/20.      # Anxiety, emotional distress:   -Patient has stressful social situation, intellectually disabled son, and regarding underlying disease process.     # Chronic buttocks pressure wound  - Wound care consult, appreciate recs  - Position changes as tolerated, waffle cushion, etc  -- Pressure wound ruled out, treating bilateral buttocks moisture and friction damage   - Rectal tube placed 2/18 to avoid contamination of wounds by stool    Wound care:  - Bilateral Groin/Thighs   Gently cleanse with warm water & Remedy no-rinse foam (pH-balanced cleanser) with soft cloths, pat dry ensuring   thoroughly dried.   Do not use CHG to rash areas.   Apply Clotrimazole ointment to rash BID, ensure thoroughly rubbed in.   Apply a piece of Interdry textile [phone]) leaving 2 inches exposed from fold to facilitate wicking.     - Bilateral Buttocks   First rinse with perineal irrigation bottle & warm water to remove as much stool as possible to minimize wiping.   Cleanse wounds & surrounding skin with Anasept wound cleanser spray ([id number]), pat dry with sterile gauze.   Apply a thick layer of Triad hydrophilic wound dressing paste (ZCH#[id number]) over wounds & periwound areas.   Promptly clean patient after episodes of incontinence using method described.   Remove only the top, soiled layer of Triad during incontinence cleaning to avoid further eroding the skin & apply another top   layer of Triad following cleaning.   Apply Triad at least BID & PRN to keep area protected.     - Pressure Injury Prevention   Continue q2h repositioning- 30 degree lateral turn to L & R only unless medically contraindicated to avoid direct pressure on   sacrum.  Place Waffle mattress overlay [phone]) under patient in bed to assist with pressure redistribution.   Use waffle cushion while OOB to chair, reposition frequently while in chair every 30 minutes.   Additionally, float heels completely off of bed. If  pillows do not suffice, order heel offloading boots ([id number]# [id number]).      # VTE PPx:  Contraindicated: Other: anticipate Plt <50    Severity of Illness  Stable     During this hospitalization the patient is also being treated for:  - Sepsis (present on admission)    - Malignancy associated fatigue on admission  - Neoplasm/malignancy associated pain  - Hypoalbuminemia (present on admission)  - Fluid status: Hypovolemia on admission     - Cachexia  - Encephalopathy: Metabolic encephalopathy   - Pancytopenia 2/2 chemotherapy    Code Status: FULL    Deboraha Sprang, MD  01/13/22

## 2022-01-13 NOTE — Consults (Signed)
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 The patient has received VRd X 4 (achieving VGPR) then Mel 200 ASCT 05/12/11. 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 She met the criteria for PD on 02/19/2020 and was started on third line DARA PD in June 2021, which was recently held on 10/20/21 due to chronic diarrhea. Patient was transferred OSH after recent hypovolemic/distributive shock. Here for acute encephalopathy workup and acute liver failure workup.     Pertinent interval history:  Nutrition following given poor PO intake and significant wt loss PTA    In-House Nutrition Orders:  Regular Diet     Anthropometric Data and Assessment:  Height: 153 cm (5' 0.24")  Ideal Body Weight: 45.99 kg    Usual body weight: 90.91 kg (verified by pt's daughter)  Admit weight: 79.6 kg (175 lb 7.8 oz) (12/09/21 2356) (Unknown)  Current weight: 95.1 kg (209 lb 10.5 oz) (01/13/22 0453) (Bed scale)    Calculation Weight: 79.6 kg (Percent IBW (calculated) (%): 173)  Calc weight BMI: 34 kg/m^2  Alternative Weight (kg): 56.75 kg    Wt Readings from Last 10 Encounters:  01/13/22  95.1 kg (209 lb 10.5 oz) Bed scale   01/11/22  93 kg (205 lb 0.4 oz)    01/09/22  90.1 kg (198 lb 10.2 oz) Bed scale   01/07/22  89.5 kg (197 lb 5 oz) Bed scale   01/06/22  90.3 kg (199 lb 1.2 oz) Bed scale   01/04/22  88.5 kg (195 lb 1.7 oz) Bed scale   01/03/22  92.2 kg (203 lb 4.2 oz) Bed scale   12/29/21  85.6 kg (188 lb 11.4 oz) Bed scale   12/27/21  85 kg (187 lb 6.3 oz) Bed scale   12/25/21  89.8 kg (197 lb 15.6 oz) Bed scale   12/24/21  90.7 kg (199 lb 15.3 oz) Bed scale   12/22/21  90.9 kg (200 lb 6.4 oz)    12/21/21  91.4 kg (201 lb 8 oz) Bed scale   12/20/21  90.6 kg (199 lb 11.8 oz)    12/17/21  89.8 kg (197 lb 15.6 oz) Bed scale   12/16/21  88.1 kg (194 lb 3.6 oz) Bed scale   12/15/21  90.2 kg (198 lb 13.7 oz) Bed  scale   12/14/21  88 kg (194 lb 0.1 oz)    12/13/21  88 kg (194 lb 0.1 oz)    12/12/21  81 kg (178 lb 9.2 oz)    12/11/21  83 kg (182 lb 15.7 oz)    12/09/21  79.6 kg (175 lb 7.8 oz) Admit Weight   06/25/21 90.7 kg (200 lb)  CE; Verbal Weight   12/12/20 96.3 kg (212 lb 4.9 oz)      Assessment of weight change: Severe weight loss  Using pt's last recorded UBW and admit weight, pt has lost 11 kg (12%) weight in the last 6-7 months, c/w severe weight loss. In-house weights likely influenced by fluid shifts given pt is edematous and net I/Os showing pt is +32 L since admit.  Will continue to use admit weight for calculations.    Nutrition-focused physical findings:   Visual Assessment: Pt appears very tired; limited participation in nutrition interview. Bed blanket pulled up to neck.    Physical Findings (Exam Date 02/22)  Muscles  Temples: Well-nourished: Can see/feel well-defined muscle  Clavicle: Well-nourished: Not visible in female, visible but not prominent in female  Shoulders: Well-nourished: Rounded, curves at arm/shoulder/neck  Interosseous Muscles: Well-nourished: Muscle bulges, could be flat in some well-nourished people  Adipose  Orbital: Well-nourished: Slightly bulged fat pads. Fluid retention may mask loss  Triceps: Well-nourished: Ample fat tissue obvious between folds of skin    Edema (per RN assessment):   Generalized Edema: Non-pitting (01/12/22 2000)  RUE Edema: Mild pitting, slight indentation (01/12/22 2000)  LUE Edema: Mild pitting, slight indentation (01/12/22 2000)  RLE Edema: Mild pitting, slight indentation (01/13/22 0925)  LLE Edema: Mild pitting, slight indentation (01/13/22 0925)  Perineal: Non-pitting (01/11/22 2100)    Cognition (per RN assessment):  Level of Consciousness: Awake, Alert  Orientation Level: Oriented to place, Oriented to person, Oriented to situation  Cognition: Short Term Memory Loss  Speech: Clear    GI Findings and Assessment:   Pt reporting formed stools now  Mouth: Dry  mouth   GI: Appetite, decrease, Nausea     Per RN assessment:   Gastrointestinal  *Gastrointestinal (WDL): Within Defined Limits  Mucous Membrane(s): Pink and moist  Abdomen Inspection: Soft  Bowel Sounds (All Quadrants): Active  Passing Flatus: Yes  GI Symptoms: Loss of appetite    Lines, Drains, Airway, Wounds:   Patient Lines/Drains/Airways Status     Active LDAs     Name Placement date Placement time Site Days    PICC Double Lumen 12/10/21 Power Central Left;Upper Arm 1:Purple 2:Red 7 cm 12/10/21  1534  Arm  33    Indwelling Urinary Catheter 12/19/21 16 12/19/21  0550  --  25    Wound 12/19/21 Skin tear Sacrum 12/19/21  1900  Sacrum  24              Food and Nutrition Intake:   Information obtained from: Patient     Percentage of meals eaten for the past 168 hrs:   Percent Meals Eaten (%)   01/13/22 0940 15 %   01/13/22 0000 100 %   01/12/22 2000 100 %   01/12/22 1642 50 %   01/11/22 2115 50 %   01/11/22 1600 30 %   01/10/22 2225 5 %   01/09/22 2108 0 %   01/08/22 1750 20 %   01/08/22 1440 0 %   01/08/22 1200 15 %   01/08/22 0900 5 %   01/07/22 1400 20 %   01/07/22 1215 0 %   01/07/22 1100 0 %   01/07/22 0900 5 %   01/06/22 2200 100 %   01/06/22 1952 50 %   01/06/22 1200 100 %   On average, pt eats 2 smaller meals/day.    Oral Food and Fluid Intake  Baseline Modified Diet: None  Meal/Snack Pattern: 3 meals per day  Meal Intake Percentage: <50% of meals    Diet Recall Details: Pt states she is able to eat 3 times/day, but struggles to finish her meals. She has been ordering smoothies/shakes, but doesn't drink Boost.     Intake Data (In-House)  Assessment of energy/protein intake:   Suboptimal protein/energy intake  Intake met what percentage of estimated needs?: 25 - 50% of estimated needs  Duration of sub-optimal intake: greater than or equal to 7 days     Barriers to adequate nutrient intake: Altered mental status, Poor appetite, Nutrient/texture restriction, Unable to tolerate PO intake  Reports difficulty  with dry foods. Limiting self to moist  items. Pt also reports menu fatigue and early satiety.    Significant Labs:   Biochemical:  Lab Results   Component Value Date    Sodium, Serum / Plasma 144 01/13/2022    Potassium, Serum / Plasma 3.1 (L) 01/13/2022    Chloride, Serum / Plasma 114 (H) 01/13/2022    Carbon Dioxide, Total 25 01/13/2022    Urea Nitrogen, Serum / Plasma 3 (L) 01/13/2022    Creatinine 0.33 (L) 01/13/2022    eGFRcr 118 01/13/2022    Glucose 101 (H) 05/10/2019    Glucose, non-fasting 79 01/13/2022    Calcium, total, Serum / Plasma 7.1 (L) 01/13/2022    Magnesium, Serum / Plasma 2.0 01/13/2022    Phosphorus, Serum / Plasma 2.8 12/23/2021   Noted hypokalemia --> repletion ordered    Lab Results   Component Value Date    Alanine transaminase 70 (H) 01/13/2022    AST 76 (H) 01/13/2022    Alkaline Phosphatase 335 (H) 01/13/2022    Bilirubin, Direct 7.1 (H) 01/13/2022    Bilirubin, Total 9.5 (H) 01/13/2022    PT 16.6 (H) 01/13/2022   Evidence of impaired liver fxn    No results found for: LACTWB  Lab Results   Component Value Date    Hemoglobin 8.3 (L) 01/13/2022    MCV 113 (H) 01/13/2022    Abs Neutrophils 2.27 01/13/2022   Evidence of macrocytic anemia    Lab Results   Component Value Date    Glucose, Glucometer 89 12/30/2021    Glucose, Glucometer 99 12/29/2021    Glucose, Glucometer 105 12/29/2021    Glucose, Glucometer 84 12/28/2021    Glucose, Glucometer 88 12/28/2021    Glucose, Glucometer 93 12/27/2021    Glucose, Glucometer 81 12/27/2021    Glucose, Glucometer 102 12/26/2021    Glucose, Glucometer 86 12/26/2021    Glucose, Glucometer 88 12/25/2021     Micronutrient Profile:  Lab Results   Component Value Date    Vitamin D, 25-Hydroxy 6 (L) 12/23/2021    Ferritin, Serum/Plasma 948 (H) 12/20/2021    Iron, serum 60 12/11/2021    Transferrin, Serum/Plasma 46 (L) 12/11/2021    % Saturation 93 (H) 12/11/2021    Vitamin B12 871 (H) 12/15/2021    Folate, serum 3.9 (L) 12/15/2021   Evidence of vitamin D  deficiency --> on supplementation    Inflammatory profile:   Lab Results   Component Value Date    C-Reactive Protein 11.3 (H) 12/13/2021    Albumin, Serum / Plasma 1.1 (L) 01/13/2022    WBC Count 2.8 (L) 01/13/2022     Stool studies:  Lab Results   Component Value Date    Clostridium difficile Toxin gene: Not detected 12/25/2021    Clostridium difficile Comment: Negative test 12/25/2021     Significant Meds:   Scheduled Meds:   0.9% sodium chloride flush  3 mL Intravenous Q12H [place]    cholecalciferol (vitamin D3)  4,000 Units Oral Daily [place]    clotrimazole   Topical BID SCH    dicyclomine  20 mg Oral 4x Daily [place]    dry mouth oral rinse  5 mL Mucous Membrane 4x Daily [place]    melatonin  6 mg Oral Daily At Bedtime [facility]    multivitamin complete chewable  1 tablet Oral Daily [place]    QUEtiapine  50 mg Oral Once    rifAXIMin  550 mg Oral BID SCH    thiamine  200 mg Intravenous Q24H  ursodiol  600 mg Oral BID [facility]    valGANciclovir  450 mg Oral BID [place]    vitamin E (dl, acetate)  800 Units Oral Daily [place]     Continuous Infusions:   dextrose 5 % and 0.9 % sodium chloride 75 mL/hr (01/13/22 0940)     PRN Meds:.0.9% sodium chloride flush, albuterol **OR** albuterol, aluminum-magnesium hydroxide-simethicone, HYDROmorphone, hydrOXYzine, loperamide, magnesium sulfate in dextrose 5 % **OR** magnesium sulfate in water, ondansetron, polyethylene glycol, potassium chloride in sterile water **OR** potassium chloride in sterile water **OR** potassium chloride, simethicone    Comparative Standards:    Energy Needs: MSJ: 4696.13- 1800.75 (based on 1286.25: x 1.3 - 1.4)   Protein Needs: 73.78 g - 79.45 g (1.3 - 1.4 g/kg based on 56.75 kg)   Fluid needs: [place]-Segar: 2051.25 mL/d     Nutrition diagnosis:   Inadequate oral intake related to physiological causes resulting in diminished appetite and intake as evidenced by sub-optimal PO intake for >1 month, severe weight loss, PO intake of <50% of meals while inpatient..  Active.    Malnutrition Summary:  Moderate Chronic disease or condition related malnutrition related to AMS as evidenced by weight loss of >10% in 6 months and energy intake of <75% for >/= 1 month .  Present on admission.    Nutrition Intervention:  Oral Nutrition:    Inpatient Orders: Current diet order appropriate    Nutrition recommendations: Energy Modification, Protein Modification  Energy Modification: Increased energy intake  Protein Modification: Increased protein intake    Modify schedule of food/fluids: Small, frequent meals     Oral Nutrition Supplements and/or Food Fortification  Supplemental nourishment plan: Continue oral nutrition supplements  Name and nutrient provision: Continue to order and drink shakes daily  Frequency recommendation: 2x daily     Vitamins/Minerals  Continue vitamin D, MVI, and thiamine as ordered     Nutrition Monitoring/Evaluation:  Nutrition Goal Indicator: Food intake  Nutrition Goal Criteria: Intake of greater than or equal to 75% of 3 meals per day  Nutrition Goal Progress: Some progress toward goal  Nutrition Goal 2 Indicator: Food intake  Nutrition Goal 2 Criteria: Intake of 1-3 oral nutrition supplements per day  Nutrition Goal 2 Progress: Some progress toward goal    Sonny Dandy, [place], [place]  [place] [phone]

## 2022-01-14 LAB — PROTHROMBIN TIME
INR: 1.4 — ABNORMAL HIGH (ref 0.9–1.2)
PT: 16.2 s — ABNORMAL HIGH (ref 11.6–15.0)

## 2022-01-14 LAB — COMPREHENSIVE METABOLIC PANEL (BMP, AST, ALT, T.BILI, ALKP, TP ALB)
AST: 79 U/L — ABNORMAL HIGH (ref 5–44)
Alanine transaminase: 70 U/L — ABNORMAL HIGH (ref 10–61)
Albumin, Serum / Plasma: 1.1 g/dL — ABNORMAL LOW (ref 3.4–4.8)
Alkaline Phosphatase: 339 U/L — ABNORMAL HIGH (ref 38–108)
Anion Gap: 6 (ref 4–14)
Bilirubin, Total: 9.4 mg/dL — ABNORMAL HIGH (ref 0.2–1.2)
Calcium, total, Serum / Plasma: 7.2 mg/dL — ABNORMAL LOW (ref 8.4–10.5)
Carbon Dioxide, Total: 23 mmol/L (ref 22–29)
Chloride, Serum / Plasma: 115 mmol/L — ABNORMAL HIGH (ref 101–110)
Creatinine: 0.32 mg/dL — ABNORMAL LOW (ref 0.55–1.02)
Glucose, non-fasting: 82 mg/dL (ref 70–199)
Urea Nitrogen, Serum / Plasma: 3 mg/dL — ABNORMAL LOW (ref 7–25)
eGFRcr: 119 mL/min/{1.73_m2} (ref >59)

## 2022-01-14 LAB — COMPLETE BLOOD COUNT WITH DIFFERENTIAL
Abs Basophils: 0.01 10*9/L (ref 0.00–0.10)
Abs Eosinophils: 0 10*9/L (ref 0.00–0.40)
Abs Imm Granulocytes: 0.02 10*9/L (ref <0.10)
Abs Lymphocytes: 0.19 10*9/L — ABNORMAL LOW (ref 1.00–3.40)
Abs Monocytes: 0.28 10*9/L (ref 0.20–0.80)
Hemoglobin: 8.6 g/dL — ABNORMAL LOW (ref 12.0–15.5)
MCH: 35.8 pg — ABNORMAL HIGH (ref 26.0–34.0)
RBC Count: 2.4 10*12/L — ABNORMAL LOW (ref 4.00–5.20)

## 2022-01-14 LAB — BASIC METABOLIC PANEL (NA, K, CL, CO2, BUN, CR, GLU, CA)
Anion Gap: 7 (ref 4–14)
Calcium, total, Serum / Plasma: 7.2 mg/dL — ABNORMAL LOW (ref 8.4–10.5)
Carbon Dioxide, Total: 21 mmol/L — ABNORMAL LOW (ref 22–29)
Chloride, Serum / Plasma: 116 mmol/L — ABNORMAL HIGH (ref 101–110)
Creatinine: 0.36 mg/dL — ABNORMAL LOW (ref 0.55–1.02)
Glucose, non-fasting: 86 mg/dL (ref 70–199)
Potassium, Serum / Plasma: 3.8 mmol/L (ref 3.5–5.0)
Sodium, Serum / Plasma: 144 mmol/L (ref 135–145)
Urea Nitrogen, Serum / Plasma: 3 mg/dL — ABNORMAL LOW (ref 7–25)
eGFRcr: 115 mL/min/{1.73_m2} (ref >59)

## 2022-01-14 LAB — MAGNESIUM, SERUM / PLASMA: Magnesium, Serum / Plasma: 1.8 mg/dL (ref 1.6–2.6)

## 2022-01-14 LAB — FIBRINOGEN, FUNCTIONAL: Fibrinogen, Functional: 168 mg/dL — ABNORMAL LOW (ref 202–430)

## 2022-01-14 MED FILL — VALGANCICLOVIR 450 MG TABLET: 450 mg | ORAL | Qty: 1

## 2022-01-14 MED FILL — FLINTSTONES COMPLETE (IRON) CHEWABLE TABLET: ORAL | Qty: 1

## 2022-01-14 MED FILL — MELATONIN 1 MG TABLET: 1 mg | ORAL | Qty: 1

## 2022-01-14 MED FILL — DICYCLOMINE 10 MG CAPSULE: 10 mg | ORAL | Qty: 2

## 2022-01-14 MED FILL — ANTI-DIARRHEAL (LOPERAMIDE) 2 MG TABLET: 2 mg | ORAL | Qty: 2

## 2022-01-14 MED FILL — MAG-AL PLUS 200 MG-200 MG-20 MG/5 ML ORAL SUSPENSION: 200-200-20 mg/5 mL | ORAL | Qty: 30

## 2022-01-14 MED FILL — XIFAXAN 550 MG TABLET: 550 mg | ORAL | Qty: 1

## 2022-01-14 MED FILL — VITAMIN E (DL, ACETATE) 45 MG (100 UNIT) CAPSULE: 45 mg (100 unit) | ORAL | Qty: 8

## 2022-01-14 MED FILL — CHOLECALCIFEROL (VITAMIN D3) 25 MCG (1,000 UNIT) TABLET: 1000 UNITS | ORAL | Qty: 4

## 2022-01-14 MED FILL — GAS RELIEF 80 (SIMETHICONE) 80 MG CHEWABLE TABLET: 80 mg | ORAL | Qty: 1

## 2022-01-14 MED FILL — MAGNESIUM SULFATE 2 GRAM/50 ML (4 %) IN WATER INTRAVENOUS PIGGYBACK: 2 gram/50 mL (4 %) | INTRAVENOUS | Qty: 50

## 2022-01-14 MED FILL — POTASSIUM CHLORIDE 40 MEQ/100ML IN STERILE WATER INTRAVENOUS PIGGYBACK: 40 mEq/100 mL | INTRAVENOUS | Qty: 100

## 2022-01-14 MED FILL — DILAUDID (PF) 0.5 MG/0.5 ML INJECTION SYRINGE: 0.5 mg/ mL | INTRAMUSCULAR | Qty: 0.5

## 2022-01-14 MED FILL — THIAMINE HCL (VITAMIN B1) 100 MG/ML INJECTION SOLUTION: 100 mg/mL | INTRAMUSCULAR | Qty: 2

## 2022-01-14 MED FILL — URSODIOL (BULK) 100 % POWDER: 100 % | Qty: 0.6

## 2022-01-14 NOTE — Consults (Signed)
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 The patient has received VRd X 4 (achieving VGPR) then Mel 200 ASCT 05/12/11. 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 She met the criteria for PD on 02/19/2020 and was started on third line DARA PD in June 2021, which was recently held on 10/20/21 due to chronic diarrhea. Patient was transferred OSH after recent hypovolemic/distributive shock. Here for acute encephalopathy workup and acute liver failure workup    Medical Update:        ASSESSMENT  Pt participatory in bed level therex, AAROM BLEs. Pt provided choice to either sit EOB or transfer to chair, requesting to stay in bed, ultimately chose OOB to chair with delayed response time and repeated offering of choises. Pt required min-modA for rolling L and R to place sling underneath and then was dependently lifted to chair via ceiling lift. Pt was left seated in chair with OT. Will continue to follow per PT POC to decreased CG burden with functional mobility. Rec placement.    Focus next session: upright tolerance, OOB, therex    RECOMMENDATIONS  DISCHARGE RECOMMENDATION  Comments Placement with ongoing PT needs  Pending medical stability.   Patient Current Functional Status Sufficient for PT Discharge Recommendation Yes   Discharge DME needs TBD pending patient progress.   Discharge transportation needs [facility] Potential Patient participates well in therapy and progressing towards goals     NURSING RECOMMENDATIONS  Inpatient Rehab Assistive Device Recommendation Vertical dependent lift;Ceiling lift   Activity Recommendation Bed in chair position, OOB to chair with ceiling lift     SUBJECTIVE  Subjective report:  "I want to rest and relax"  Notable observations:  Supine in bed with HOB elevated at beginning of session. Seated in chair with sling underneath and safety belt to front. OT in  room.    SYSTEMS REVIEW  Cognition/Communication  Delirium screen:  Yes Delirium Screen:   Details:  Encephalopathic    Communication  Cognition/communication impaired:  Yes     Cognition: Decreased insight to deficits, Delayed response  Cognition comment: Able to consistently follow one step commands when she chooses to. Impaired memory. Delayed respons to questions.  Behavior: Self-limiting  Behavior comment: Behavioral    Integumentary  Integumentary deficits:  YesIntegumentary deficit detail:  Jaundiced, edematous, wounds at buttocks, foley    Cardiopulmonary  Cardiopulmonary deficits:  Yes  Detail:  Decreased activity tolerance    Musculoskeletal  Musculoskeletal deficits:  Yes  Abnormal strength findings: Bilateral shoulder flexion 3-/5 bilaterally, elbow flexion at least 3/5 bilaterally. Hip flexion 2+/5 bilaterally, knee extension 2+/5 bilaterally, ankle DF 3-/5 bilaterally    Neuromuscular  Neuromuscular deficits:  Yes  Motor control or coordination: Impaired at bilateral lower extremities >> bilateral upper extremities        Pain     Currently in pain: No          COMPREHENSIVE MOVEMENT ANALYSIS/TREATMENT  Precautions/WB status: Yes  Precautions and weight bearing status comments: delrium, falls, skin      Hemodynamic response:   Normal hemodynamic response: Yes  Response:     Comments:        Functional Mobility  Requires second person/additional health care providers: Yes  Type additional health care provider: OT    Bed mobility Current Initial    Rolling  Level of assist  Moderate  assist;Minimal assist  Maximal assist (12/15/21 0932)   Device  Log roll  Log roll (12/15/21 0932)   Intervention  Verbal cues;Tactile cues  Verbal cues;Tactile cues (01/14/22 1305)   Rolling comments:  Min-Mod A rolling L and R to place sling for dependent transfer    Transfer Current Initial   Sit < > Stand  Level of assist   Maximal assist;Two person assist (12/16/21 0930)   Device     Intervention     Transfer  Level of  assist  From: Bed  To: Chair  Dependent assist  Dependent assist (12/18/21 9169)   Device  Vertical dependent lift  Vertical dependent lift (12/18/21 4503)   Intervention  Verbal cues;Tactile cues  Verbal cues;Tactile cues (12/18/21 0938)   Sit < > Stand comments:   Transfer comments:   vertical dependent lift supine > sitting in chair, posey belt    Balance  Balance deficits noted:    Exercise/other interventions  Supine exercises: Ankle pumps;Heel slides;Hip abd/add  Supine exercises comment (other exercise, details): AAROM  Other exercise: edu on importance of mobility    Communication between other health care providers: Communication between other health care provider: RN;OT  Communication comment: patient's status     Education assessment  Learner: Patient  Content: Activity recommendations  Response: Needs reinforcement  Outcome measures   Physical Therapist Global Assessment of Mobility  Activity Achieved: Passive transfer to chair/commode  AMPAC 6-clicks basic mobility score: 9      PLAN  Plan of care status:  Current plan of care remains appropriate  PT frequency:  3x/week  PT duration:  4 weeks.  Comment:  POC expires 02/10/2022      Planned PT interventions:   Specific interventions: Progressive functional mobility training;Gait training;Balance training;NM Re-ed;Aerobic training;Ther ex  Education interventions: Caregiver training;Benefits of activity;Self-pacing/breathing;Exercise program;Mobility with precautions compliance;Fall risk reduction  Comment:          Brien Mates, PT    01/14/2022

## 2022-01-14 NOTE — Consults (Signed)
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 The patient has received VRd X 4 (achieving VGPR) then Mel 200 ASCT 05/12/11. 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 She met the criteria for PD on 02/19/2020 and was started on third line DARA PD in June 2021, which was recently held on 10/20/21 due to chronic diarrhea. Patient was transferred OSH after recent hypovolemic/distributive shock. Here for acute encephalopathy workup and acute liver failure workup.    Assessment and Treatment Plan    OT Progress summary: Pt demonstrated improved engagement and agreeable to transer to chair via hoyer when offfer EOB or chair. Pt engaged in grooming at sink while seated in chair, requires cueing. Pt requires dependent assist for ADls but starting to make progress with agreeable to OOB activity. Recommend placement.  Progress with OT: Slow progress  Current maximal level of assist: Dependent assist  Next session focus:      Recommendations    Recommended Discharge Disposition Placement for continued therapy   Discharge DME Recommendations Defer to next level of care;To be determined   Equipment Recommendations     DME Comment     Discharge Recommendations Comment     Rehab Potential Patient participates well in therapy and is progressing towards goal;Unable to tolerate 3 hours of therapy a day   Anticipated Assistance Available at Discharge Family;Spouse/Significant other   Anticipated Type of Assistance Available at Discharge Assist as needed   Anticipated Time of Assistance Available at Discharge Unknown at this time   Barriers to Discharge Medical issues   Patient's current functional ability appropriate for D/C recommendations Yes     Inpatient Recommendations  OT Inpatient Recommendations     Recommendation Comments Sling to chair and 2p assist   Current Maximal Level of Assist Needed  Dependent assist     Brace/Precautions   Brace/Orthotic/Prosthetic:   Precautions:Has weight bearing limitation or precaution: Yes  Precautions and weight bearing status comments: delrium, falls, skin     Subjective Report:"I guess the chair"    Patient/Family Goal:     Objective Findings and Interventions    Areas of Occupation    Grooming and Light Hygiene Minimal assistance   Cueing Required     Comment Pt brush teeth with setup, wash face. max A comb hair   Self Feeding     Cueing Required     Comment     Toileting     Cueing Required     Toileting Clothing Management     Cueing Required     Comment     Upper Body Dressing     Cueing Required     Comment     Lower Body Dressing     Cueing Required     Comment     Upper Body Bathing     Cueing Required     Comment     Lower Body Bathing     Cueing Required     Comment     ADL/IADL Comment         Functional Transfers    Bed Mobility From Supine   Bed Mobility To Sidelying   Level of Assist Moderate assistance   Cueing Required     Transfer From Bed   Transfer To Chair   Level of Assist Dependent   Cueing Required  Technique     Functional Transfer Comment 2p hoyer to chair, engaged in ADLs in chair.         [place] for ADLs  6 Click Score: 11    Client Factors                                                 OT Education       OT Plan of [place], [place]  01/14/2022 2:29 PM

## 2022-01-14 NOTE — Progress Notes (Signed)
[facility] PROGRESS NOTE  ATTENDING ONLY     My date of service is 01/14/22.    24 Hour Course  NAEON.   LFTs slowly improving     Subjective  Reports feeling well. No abdominal pain    Vitals  Temp:  [36.8 C (98.2 F)-37.1 C (98.8 F)] 37 C (98.6 F)  Heart Rate:  [75-81] 75  *Resp:  [16-18] 18  BP: (101-122)/(70-80) 106/70  SpO2:  [96 %-98 %] 96 %    MostRecent Weight: 95.9 kg (211 lb 6.7 oz)  Admission Weight: 79.6 kg (175 lb 7.8 oz)      Intake/Output Summary (Last 24 hours) at 01/14/2022 1252  Last data filed at 01/14/2022 0930  Gross per 24 hour   Intake 2085 ml   Output 1700 ml   Net 385 ml     Pain Score: 0    Physical Exam  Vitals reviewed.   Constitutional:       Appearance: She is ill-appearing.   HENT:      Head: Normocephalic and atraumatic.      Nose: Nose normal.      Mouth/Throat:      Mouth: Mucous membranes are moist.      Pharynx: Oropharynx is clear.   Eyes:      General: Scleral icterus present.      Extraocular Movements: Extraocular movements intact.      Pupils: Pupils are equal, round, and reactive to light.   Cardiovascular:      Rate and Rhythm: Normal rate and regular rhythm.   Pulmonary:      Effort: Pulmonary effort is normal. No respiratory distress.   Abdominal:      General: Abdomen is flat. There is no distension.      Palpations: Abdomen is soft. There is no shifting dullness or fluid wave.      Tenderness: There is no abdominal tenderness.   Musculoskeletal:      Cervical back: Normal range of motion and neck supple.      Right lower leg: No edema.      Left lower leg: No edema.   Skin:     General: Skin is warm and dry.      Coloration: Skin is jaundiced.   Neurological:      Mental Status: She is alert and oriented to person, place, and time.   Psychiatric:         Mood and Affect: Mood normal.         Behavior: Behavior is cooperative.     Scheduled Meds:   0.9% sodium chloride flush  3 mL Intravenous Q12H [place]    cholecalciferol (vitamin D3)  4,000 Units  Oral Daily [place]    clotrimazole   Topical BID SCH    dicyclomine  20 mg Oral 4x Daily [place]    dry mouth oral rinse  5 mL Mucous Membrane 4x Daily [place]    melatonin  6 mg Oral Daily At Bedtime [facility]    multivitamin complete chewable  1 tablet Oral Daily [place]    QUEtiapine  50 mg Oral Once    rifAXIMin  550 mg Oral BID [place]    thiamine  200 mg Intravenous Q24H    ursodiol  600 mg Oral BID [facility]    valGANciclovir  450 mg Oral BID [place]    vitamin E (dl, acetate)  800 Units Oral Daily [place]     Continuous Infusions:   dextrose 5 % and 0.9 %  sodium chloride 75 mL/hr (01/13/22 2157)     PRN Meds:   0.9% sodium chloride flush  3 mL Intravenous PRN    albuterol  2.5 mg Nebulization Q4H PRN    Or    albuterol  2.5 mg Nebulization Q4H PRN    aluminum-magnesium hydroxide-simethicone  30 mL Oral Q8H PRN    HYDROmorphone  0.3-0.8 mg Intravenous Q4H PRN    hydrOXYzine  10 mg Oral Q8H PRN    loperamide  4 mg Oral 4x Daily PRN    magnesium sulfate in dextrose 5 %  2-4 g Intravenous Daily PRN    Or    magnesium sulfate in water  2-4 g Intravenous Daily PRN    ondansetron  8 mg Intravenous Q8H PRN    polyethylene glycol  17 g Oral Daily PRN    potassium chloride in sterile water  20-80 mEq Intravenous Daily PRN    Or    potassium chloride in sterile water  20-80 mEq Intravenous Daily PRN    Or    potassium chloride  20-80 mEq Oral Daily PRN    simethicone  80 mg Oral BID PRN     Data    CBC        01/14/22  0257 01/13/22  0446   WBC 2.6* 2.8*   HGB 8.6* 8.3*   HCT 26.4* 27.0*   PLT 43* 45*     Coags        01/14/22  0257 01/13/22  0446   INR 1.4* 1.4*     Chem7        01/14/22  0257 01/13/22  1818 01/13/22  0446   NA 144 144 144   K 3.0* 3.8 3.1*   CL 115* 116* 114*   CO2 23 21* 25   BUN 3* 3* 3*   CREAT 0.32* 0.36* 0.33*   GLU 82 86 79     Electrolytes        01/14/22  0257 01/13/22  1818 01/13/22  0446   CA 7.2* 7.2* 7.1*   MG 1.8  --  2.0     Liver Panel        01/14/22  0257 01/13/22  0446   AST 79* 76*   ALT 70*  70*   ALKP 339* 335*   TBILI 9.4* 9.5*   TP 3.1* 3.2*   ALB 1.1* 1.1*     Lactate  No results found in last 36 hours    Microbiology Results (last 24 hours)     Procedure Component Value Units Date/Time    Urine Culture [[id number]]     Order Status: Sent Specimen: Not Applicable from Urine, Midstream         Radiology Results  No results found.     I discussed the patient with Melanee Left, MD from [facility] regarding A&P.    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 The patient has received VRd X 4 (achieving VGPR) then Mel 200 ASCT 05/12/11. 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 She met the criteria for PD on 02/19/2020 and was started on third line DARA PD in June 2021, which was recently held on 10/20/21 due to chronic diarrhea. Patient was transferred OSH after recent hypovolemic/distributive shock. Here for acute encephalopathy workup and acute liver failure workup.      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   June 2020 due to progression of M spike Revlimid was increased to 15 mg PO D1-21 every 28 days, plus dexamethasone  July 2021: started daratumumab, Pomylast 2 mg D 1-21, Q28 days, dexamethasone 12 mg PO Q weekly.   Primary oncologist: Dr. Iona Coach,  Dr. Kennon Rounds [facility]  Receiving dexamethasone 12 mg weekly for diarrhea  Receiving MT with daratumumab with pomalyst and dex, most recent dose 10/16/21, has since been on hold due to worsening diarrhea.     DATA:   09/07/2010: IgG Kappa MM  12/02/2021: Ig M <5, IgG 447, IgA 20, Ig E <2, SFLC Kappa 16.6, Lambda 1.6, Kappa/Lamda 10.38  12/14/21: IgG K on IFE, IgG 430, KLC 16.6, LLC 2.7, KLR 6.15    Chemo:   Holding Daratumumab and Pomalyst, last dose  10/20/21, given recent infection and acute liver failure.    Supportive Care:   Transfusion: Keep Hg >7, and Platelets >10.   Line: PICC placed 1/26    Outpatient Oncologist: Dr. Kennon Rounds    Dispo: Given improvement in diarrhea and slowly improving Tbili (will take weeks to months per hepatology), plan to transfer back to prior OSH once bed available    # Immunocompromised  # CMV colitis  # At risk for infections due to malignancy  # Shock, likely hypovolemic vs distributive in the setting of chronic diarrhea, + norovirus infection   # Acute on chronic diarrhea, norovirus positive., diff and OP negative.   -No prior evidence of adrenal insufficiency, AM cortisol levels normal, previously on dexamethasone 8 mg PO Qweekly, but has been held since early December.    Data:  - CT brain: No acute intracranial hemorrhage, herniation, or hydrocephalus.  - CT chest/A/P pending: Medium-sized bilateral pleural effusions with bilateral peribronchovascular groundglass opacities, suggestive of pulmonary edema. Clustered nodules in right middle lobe along the right minor fissure, which could represent mild aspiration or infection. Numerous sclerotic and mixed lytic/sclerotic lesions throughout the thoracic spine and bilateral ribs, increased in size and number compared to prior. Appearance is atypical for multiple myeloma but may represent a combination of treated, partially treated, and active disease. No acute pathologic fracture. Findings compatible with enterocolitis of infectious or inflammatory etiology. Involvement of the colon is greater than of the small bowel. Marked hepatic steatosis. Increased sclerosis of bone lesions throughout the thoracolumbar spine, bony pelvis, and proximal left femur. Appearance is atypical for multiple myeloma but may represent a combination of treated, partially treated, and active disease. No acute pathologic fracture.  - 1/29:CMV PCR 275,170YFVC  - 1/31: GI PCR panel positive only for  Norovirus.   2/1: LP results: Neutrophils 6% in tube 1, Lymphocytes 88% (predominant), protein 42 (normal), glucose 36 (low), rapid HSV in CSF negative. CMV CSF PCR negative    - 2/2 CMV PCR 179,555High   - 2/2 Flex Sig Biopsies prelim: "colonic mucosa with a rare small non-necrotizing mucosal granuloma and rare CMV immunolabeling"   - 2/5 [place] work up: sIL2 (1556.6 high), ferritin (948 high), TG(258 high), NKA-IFN? 10 (WNL)  - 2/8: CMV PCR: 3107 (sig improvement)  - 2/10: C diff: negative.  - 2/12: Repeat GI viral PCR: positive for Norovirus  - 2/15 CMV 755  - 2/17 IgG 745  - 2/22: Repeat GI viral  PCR positive for Norovirus    - 2/22 CMV 74    Plan:  - appreciate ID recs  -- Ganciclovir 5 mg/kg (1/31-2/21). S/p one dose of IVIG on 2/7.   -- S/p Valcyte 900 mg BID (2/21 -> 2/23) -> transitioned to maintenance Valcyte 484m BID   -- S/p Alinia (nitazoxanide) for 7 days (2/16 - 2/22) for norovirus treatment. Repeat GI viral PCR was persistently positive for Norovirus on 2/22, but ID said it is likely from colonization. No need to treat further  -- s/p IV Zosyn 4.5 g Q8hrs (1/26-1/28, restarted 1/29 - 2/1 for recurrent fever)  -- GI consult: appreciate recs: flex sig 2/2  -- Weekly CMV  -- Rifaximin for hepatic encephalopathy and SIBO as below     # Acute liver injury of unclear etiology  # Acute on chronic encephalopathy likely 2/2 infectious vs hepatic vs metabolic  # Hyperbilirubinemia  # Anasarca  # Hypoalbuminemia    Data:   -Steady rise in LFTs in 05/2021,  -12/02/21: AST 152--> 121, ALT 130--> 110, TB 5.4, Albumin 1.1  -Hepatitis panel negative.   -1/26:  Tbili 14.6, GGT 1160, ammonia 25  -CTAP 12/02/21: Scattered sclerotic lesions in the lumbar spine, hepatic steatosis.   -RUQ ultrasound: liver enlarged with diffuse increased echogenicity, consistent with fatty infiltration. 2cm stone in the gallbladder.   -HIDA scan: Normal radiotracer filling of the gallbladder is identified, therefore the exam is not  consistent with cystic duct obstruction or acute cholecystitis.  - [place] 1/27: Hepatomegaly with diffusely increased echogenicity which can be seen with hepatic amyloidosis or steatosis. Please refer to the recently performed biopsy for further details. Mild perihepatic ascites. Patent hepatic vasculature with normal waveforms. Cholelithiasis without acute cholecystitis. Right pleural effusion.  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 CMV stains: Negative.  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 Could be drug related. Increase Ursodiol to 600 mg BID, Check Hepatitis E. 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 Unlikely diarrhea from Ursodiol.  - Avoiding steroids  - Daily LFT/INR  - RD consult placed to expand diet to more than protein shakes.  -- Family requesting appetite stimulant but risk > benefit.     # Acute toxic encephalopathy, improving  In the setting of liver failure, ammonia only 25. LP performed 2/1 to r/o encephalitis.     Data:   1/31: HIV, TSH, Folate, B12 WNL  1/30: CT head WNL other than sinus disease  2/1: LP results: Neutrophils 6% in tube 1, Lymphocytes 88% (predominant), protein 42 (normal), glucose 36 (low), rapid HSV in CSF negative. CMV CSF PCR negative  2/5:  Brain MRI: Smooth diffuse dural thickening and enhancement as well as bilateral holohemispheric subdural effusions, which may be on the basis of diminished intracranial pressure following recent lumbar puncture.  -  2/8: EEG: non-specific mild cerebral dysfunction.  -2/9: CT Brain: similar to prior MRI with hyperdense areas likely hygromas    Plan:  - Neurology consult: 2/7: Encephalopathy likely from underlying medical disease. Delirium prevention. Avoid Ativan and try Seroquel for delirium. MRI findings is from LP. Okay to try caffiene tablets in the daytime to help with postural headache but patient is allergic to it.  -2/8: Repeat CT head to monitor the status of subdural effusion: stable, EEG: diffuse slowing, Thiamine iv prophylaxis.  -Speech path: Regular and thin liquid. Slightly higher risk for aspiration given encephalopathy.  - Start empiric rifaximin given possibility of HE without elevated ammonia (2/14 ->)  -- Rifaximin dose adjusted to cover for SIBO, then de-escalated back to HE coverage on 2/22   -- holding off on lactulose given diarrhea    # Deconditioning  Pt has not been engaging much with PT/OT despite improved mental status. May be due in part to anxieties about diarrhea/incontinence.  - continue PT/OT  - continue encouragement to participate, particularly by family/familiar faces    # Bloating  May be due to indigestion. Denies constipation. Exam not c/w significant ascites.  - mylanta, simethicone prn    # Hypokalemia:  # Hypomagnesemia:  Replete as needed.    # Vitamin D deficiency:  -Supplements ordered.    # Moderate malnutrition:  -Seen by RD and given recs for supplements.    # History of PE, diagnosed in April 2019  - hold home Herman for now given recent anemia and concerns for bleeding.   - monitor for signs and symptoms of worsening bleeding.     # Elevated TSH at OSH, likely 2/2 subclinical hypothyroidism  -TSH WNL on 12/15/20.      # Anxiety, emotional distress:   -Patient has  stressful social situation, intellectually disabled son, and regarding underlying disease process.     # Chronic buttocks pressure wound  - Wound care consult, appreciate recs  - Position changes as tolerated, waffle cushion, etc  -- Pressure wound ruled out, treating bilateral buttocks moisture and friction damage   - Rectal tube placed 2/18 to avoid contamination of wounds by stool    Wound care:  - Bilateral Groin/Thighs   Gently cleanse with warm water & Remedy no-rinse foam (pH-balanced cleanser) with soft cloths, pat dry ensuring   thoroughly dried.   Do not use CHG to rash areas.   Apply Clotrimazole ointment to rash BID, ensure thoroughly rubbed in.   Apply a piece of Interdry textile [phone]) leaving 2 inches exposed from fold to facilitate wicking.     - Bilateral Buttocks   First rinse with perineal irrigation bottle & warm water to remove as much stool as possible to minimize wiping.   Cleanse wounds & surrounding skin with Anasept wound cleanser spray ([id number]), pat dry with sterile gauze.   Apply a thick layer of Triad hydrophilic wound dressing paste ([id number]) over wounds & periwound areas.   Promptly clean patient after episodes of incontinence using method described.   Remove only the top, soiled layer of Triad during incontinence cleaning to avoid further eroding the skin & apply another top   layer of Triad following cleaning.   Apply Triad at least BID & PRN to keep area protected.     - Pressure Injury Prevention   Continue q2h repositioning- 30 degree lateral turn to L & R only unless medically contraindicated to avoid direct pressure on   sacrum.   Place Waffle mattress  overlay ([id number]) under patient in bed to assist with pressure redistribution.   Use waffle cushion while OOB to chair, reposition frequently while in chair every 30 minutes.   Additionally, float heels completely off of bed. If pillows do not suffice, order heel offloading boots ([id number]# [id number]).      # VTE  PPx:  Contraindicated: Other: anticipate Plt <50    Severity of Illness  Stable     During this hospitalization the patient is also being treated for:  - Sepsis (present on admission)    - Malignancy associated fatigue on admission  - Neoplasm/malignancy associated pain  - Hypoalbuminemia (present on admission)  - Fluid status: Hypovolemia on admission     - Cachexia  - Encephalopathy: Metabolic encephalopathy   - Pancytopenia 2/2 chemotherapy    Code Status: FULL    Deboraha Sprang, MD  01/14/22

## 2022-01-15 LAB — TYPE AND SCREEN
ABO/RH(D): O POS
Antibody Screen: NEGATIVE

## 2022-01-15 LAB — COMPLETE BLOOD COUNT WITH DIFFERENTIAL
Abs Basophils: 0.01 10*9/L (ref 0.00–0.10)
Abs Eosinophils: 0.01 10*9/L (ref 0.00–0.40)
Abs Imm Granulocytes: 0.05 10*9/L (ref <0.10)
Abs Lymphocytes: 0.28 10*9/L — ABNORMAL LOW (ref 1.00–3.40)
Abs Monocytes: 0.36 10*9/L (ref 0.20–0.80)
Abs Neutrophils: 2.32 10*9/L (ref 1.80–6.80)
Hematocrit: 25.9 % — ABNORMAL LOW (ref 36.0–46.0)
Hemoglobin: 8.4 g/dL — ABNORMAL LOW (ref 12.0–15.5)
MCH: 35.1 pg — ABNORMAL HIGH (ref 26.0–34.0)
MCHC: 32.4 g/dL (ref 31.0–36.0)
MCV: 108 fL — ABNORMAL HIGH (ref 80–100)
MPV: 14 fL — ABNORMAL HIGH (ref 9.1–12.6)
Platelet Count: 47 10*9/L — ABNORMAL LOW (ref 140–450)
RBC Count: 2.39 10*12/L — ABNORMAL LOW (ref 4.00–5.20)
RDW-CV: UNDETERMINED % (ref 11.7–14.4)
WBC Count: 3 10*9/L — ABNORMAL LOW (ref 3.4–10.0)

## 2022-01-15 LAB — BASIC METABOLIC PANEL (NA, K, CL, CO2, BUN, CR, GLU, CA)
Anion Gap: 6 (ref 4–14)
Calcium, total, Serum / Plasma: 7.3 mg/dL — ABNORMAL LOW (ref 8.4–10.5)
Carbon Dioxide, Total: 21 mmol/L — ABNORMAL LOW (ref 22–29)
Chloride, Serum / Plasma: 116 mmol/L — ABNORMAL HIGH (ref 101–110)
Creatinine: 0.37 mg/dL — ABNORMAL LOW (ref 0.55–1.02)
Potassium, Serum / Plasma: 3.6 mmol/L (ref 3.5–5.0)
Sodium, Serum / Plasma: 143 mmol/L (ref 135–145)
Urea Nitrogen, Serum / Plasma: 3 mg/dL — ABNORMAL LOW (ref 7–25)
eGFRcr: 115 mL/min/{1.73_m2} (ref >59)

## 2022-01-15 LAB — CYTOMEGALOVIRUS DNA, QUANTITATIVE PCR, PLASMA
CMV DNA (IU/mL): 176 IU/mL — ABNORMAL HIGH (ref <30)
CMV DNA (Log IU/mL): 2.25 — ABNORMAL HIGH (ref <1.48)

## 2022-01-15 LAB — FIBRINOGEN, FUNCTIONAL: Fibrinogen, Functional: 155 mg/dL — ABNORMAL LOW (ref 202–430)

## 2022-01-15 LAB — BILIRUBIN, DIRECT: Bilirubin, Direct: 6.2 mg/dL — ABNORMAL HIGH (ref <0.6)

## 2022-01-15 LAB — COMPREHENSIVE METABOLIC PANEL (BMP, AST, ALT, T.BILI, ALKP, TP ALB)
AST: 81 U/L — ABNORMAL HIGH (ref 5–44)
Alanine transaminase: 71 U/L — ABNORMAL HIGH (ref 10–61)
Albumin, Serum / Plasma: 1.1 g/dL — ABNORMAL LOW (ref 3.4–4.8)
Alkaline Phosphatase: 341 U/L — ABNORMAL HIGH (ref 38–108)
Anion Gap: 5 (ref 4–14)
Bilirubin, Total: 8.7 mg/dL — ABNORMAL HIGH (ref 0.2–1.2)
Calcium, total, Serum / Plasma: 7.2 mg/dL — ABNORMAL LOW (ref 8.4–10.5)
Carbon Dioxide, Total: 23 mmol/L (ref 22–29)
Chloride, Serum / Plasma: 115 mmol/L — ABNORMAL HIGH (ref 101–110)
Creatinine: 0.34 mg/dL — ABNORMAL LOW (ref 0.55–1.02)
Glucose, non-fasting: 91 mg/dL (ref 70–199)
Potassium, Serum / Plasma: 3.5 mmol/L (ref 3.5–5.0)
Protein, Total, Serum / Plasma: 3.1 g/dL — ABNORMAL LOW (ref 6.3–8.6)
Sodium, Serum / Plasma: 143 mmol/L (ref 135–145)
Urea Nitrogen, Serum / Plasma: 2 mg/dL — ABNORMAL LOW (ref 7–25)
eGFRcr: 117 mL/min/{1.73_m2} (ref >59)

## 2022-01-15 LAB — MAGNESIUM, SERUM / PLASMA: Magnesium, Serum / Plasma: 1.9 mg/dL (ref 1.6–2.6)

## 2022-01-15 LAB — PROTHROMBIN TIME
INR: 1.5 — ABNORMAL HIGH (ref 0.9–1.2)
PT: 17.2 s — ABNORMAL HIGH (ref 11.6–15.0)

## 2022-01-15 MED ORDER — ACYCLOVIR 400 MG TABLET
400 | ORAL_TABLET | ORAL | 3 refills | Status: DC
Start: 2022-01-15 — End: 2022-04-20

## 2022-01-15 MED FILL — XIFAXAN 550 MG TABLET: 550 mg | ORAL | Qty: 1

## 2022-01-15 MED FILL — MELATONIN 1 MG TABLET: 1 mg | ORAL | Qty: 1

## 2022-01-15 MED FILL — POTASSIUM CHLORIDE 40 MEQ/100ML IN STERILE WATER INTRAVENOUS PIGGYBACK: 40 mEq/100 mL | INTRAVENOUS | Qty: 100

## 2022-01-15 MED FILL — ONDANSETRON HCL (PF) 4 MG/2 ML INJECTION SOLUTION: 4 mg/2 mL | INTRAMUSCULAR | Qty: 4

## 2022-01-15 MED FILL — URSODIOL 60 MG/ML ORAL SYRUP: 60 mg/mL | ORAL | Qty: 10

## 2022-01-15 MED FILL — DILAUDID (PF) 0.5 MG/0.5 ML INJECTION SYRINGE: 0.5 mg/ mL | INTRAMUSCULAR | Qty: 0.5

## 2022-01-15 MED FILL — GAS RELIEF 80 (SIMETHICONE) 80 MG CHEWABLE TABLET: 80 mg | ORAL | Qty: 1

## 2022-01-15 MED FILL — CHOLECALCIFEROL (VITAMIN D3) 25 MCG (1,000 UNIT) TABLET: 1000 UNITS | ORAL | Qty: 4

## 2022-01-15 MED FILL — VITAMIN E (DL, ACETATE) 45 MG (100 UNIT) CAPSULE: 45 mg (100 unit) | ORAL | Qty: 8

## 2022-01-15 MED FILL — DICYCLOMINE 10 MG CAPSULE: 10 mg | ORAL | Qty: 2

## 2022-01-15 MED FILL — VALGANCICLOVIR 450 MG TABLET: 450 mg | ORAL | Qty: 1

## 2022-01-15 MED FILL — ANTI-DIARRHEAL (LOPERAMIDE) 2 MG TABLET: 2 mg | ORAL | Qty: 2

## 2022-01-15 MED FILL — FLINTSTONES COMPLETE (IRON) CHEWABLE TABLET: ORAL | Qty: 1

## 2022-01-15 MED FILL — THIAMINE HCL (VITAMIN B1) 100 MG/ML INJECTION SOLUTION: 100 mg/mL | INTRAMUSCULAR | Qty: 2

## 2022-01-15 MED FILL — MAGNESIUM SULFATE 2 GRAM/50 ML (4 %) IN WATER INTRAVENOUS PIGGYBACK: 2 gram/50 mL (4 %) | INTRAVENOUS | Qty: 50

## 2022-01-15 MED FILL — MAG-AL PLUS 200 MG-200 MG-20 MG/5 ML ORAL SUSPENSION: 200-200-20 mg/5 mL | ORAL | Qty: 30

## 2022-01-15 MED FILL — URSODIOL (BULK) 100 % POWDER: 100 % | Qty: 0.6

## 2022-01-15 NOTE — Discharge Summary (Signed)
[facility]     Patient Name: Ariel Braun  Patient MRN: [id number]  Date of Birth: January 19, 1961     Facility: [facility]  Attending Physician: Ariel Braun, [place]: [phone]    Date of Admission: 12/09/2021  Date of Discharge: 01/15/2022    Admission Diagnosis: Multiple myeloma (CMS code) [C90.00]  Discharge Diagnosis: Multiple myeloma (CMS code)    Discharge Disposition: Other: [facility]    My date of service is 01/15/22.    History (with Chief Complaint)    Ariel Braun is a 61 year old female with pmx of MM. 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 M spike 32, kappa 596.9 mg/L, IgG 4750, B2M 3.88. The patient has received VRd X4 (achieving VGPR) then Mel 200 ASCT 05/12/11. 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 She met the criteria for PD on 02/19/2020 and was started on third line DARA PD in June 2021.     The patient was recently hospitalized at [facility] on 12/02/21 after recommendation of Ariel Braun during a video visit, she was complaining of increased weakness, persistent diarrhea. The patient had reporting having chronic diarrhea since her COVID booster in August 2022 and had developed clinical deterioration over the past month.     Her hospital course was complicated by ICU admission for hypovolemic shock intermittently requiring pressor support (phenylephrine gtt).  She was found to be pancytopenic with hg of 6.6 and Lactate of 11. Patient had initially refused prbc transfusion. Patient was found to be positive for norovirus. LFTs were elevated consistent with acute liver failure (AST 118, [name], Alp 958, TB 12.1). procal 1.2. MRI of the liver with MRCP was done which showed hepatic steatosis. RUQ ultrasound showed an enlarged liver with large 2cm stone in the gallbladder. HIDA scan was done: findings were not  consistent with cystic duct obstruction or acute cholecystitis. The patient was evaluated by GI and advised to follow with hepatology Ariel [facility] for acute liver failure workup, liver biopsy. Patient also has a history of pulmonary embolism on eliquis which was held for progressive anemia. Patient was found to have an appropriate rise in cortisol with ACTH stimulation, patient refusing decadron. Creatinine was wnls.     Per daughter, con fusion started about a week prior to coming into the hospital at the last admission. There has been some improvement from baseline. Still having diarrhea, although the frequency of the diarrhea has decreased during course of hospitalization.     Brief Hospital Course by Problem    # MM with lytic lesions in the L spine  IgG Kappa MM on 09/07/2010.  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   June 2020 due to progression of M spike Revlimid was increased to 15 mg PO D1-21 every 28 days, plus dexamethasone  July 2021: started daratumumab, Pomylast 2 mg D 1-21, Q28 days, dexamethasone 12 mg PO Q weekly.   Primary oncologist: Ariel [facility]; [facility]  Receiving dexamethasone 12 mg weekly for diarrhea as outpt  Receiving MT with daratumumab with pomalyst and dex, most recent dose 10/16/21, has since been on hold due to worsening diarrhea.    DATA:   09/07/2010: IgG Kappa MM  12/02/2021: Ig M <5, IgG 447, IgA 20, Ig E <2, SFLC Kappa 16.6, Lambda 1.6, Kappa/Lamda 10.38  12/14/21: IgG K on IFE, IgG 430, KLC 16.6, LLC 2.7, KLR 6.15    Chemo:   Holding Daratumumab and Pomalyst, last dose 10/20/21, given recent infection and acute liver failure.    Supportive Care:   Transfusion: Keep Hg >7, and Platelets >10.   Per daughter, has autoantibodies in blood was a difficult match. Would like to be contacted prior to transfusion.   Line:PICC placed  1/26, kept in place for transfer    Outpatient Oncologist: [facility]    Dispo: Transfer back to [facility], likely followed by SNF              Can have a video visit follow up with Ariel Braun in 2 weeks    # Immunocompromised  # CMV colitis  # At risk for infections due tomalignancy  # Shock, likely hypovolemic vs distributive in the setting of chronic diarrhea, + norovirus infection  # Acute on chronic diarrhea, norovirus positive (presumed colonized), O/P negative   -No prior evidence of adrenal insufficiency, AM cortisol levels normal, previously on dexamethasone 8 mg PO Qweekly, but has been held since early December.    Data:  - CT brain: No acute intracranial hemorrhage, herniation, or hydrocephalus.  - CT chest/A/P pending: Medium-sized bilateral pleural effusions with bilateral peribronchovascular groundglass opacities, suggestive of pulmonary edema. Clustered nodules in right middle lobe along the right minor fissure, which could represent mild aspiration or infection. Numerous sclerotic and mixed lytic/sclerotic lesions throughout the thoracic spine and bilateral ribs, increased in size and number compared to prior. Appearance is atypical for multiple myeloma but may represent a combination of treated, partially treated, and active disease. No acute pathologic fracture. Findings compatible with enterocolitis of infectious or inflammatory etiology. Involvement of the colon is greater than of the small bowel. Marked hepatic steatosis. Increased sclerosis of bone lesions throughout the thoracolumbar spine, bony pelvis, and proximal Braun femur. Appearance is atypical for multiple myeloma but may represent a combination of treated, partially treated, and active disease. No acute pathologic fracture.  - 1/29:CMV PCR [id number]  - 1/31: GI PCR panel positive only for Norovirus.   2/1: LP results: Neutrophils 6% in tube 1, Lymphocytes 88% (predominant), protein 42 (normal), glucose 36 (low),  rapid HSV in CSF negative. CMV CSF PCR negative    - 2/2 CMV PCR 179,555High   - 2/2 Flex Sig Biopsies prelim: "colonic mucosa with a rare small non-necrotizing mucosal granuloma and rare CMV immunolabeling"   - 2/5 Flowella work up: sIL2 (1556.6 high), ferritin (948 high), TG(258 high), NKA-IFN? 10 (WNL)  - 2/8: CMV PCR: 3107 (sig improvement)  - 2/10: C diff: negative.  - 2/12: Repeat GI viral PCR: positive for Norovirus  - 2/15 CMV 755  - 2/17 IgG 745  - 2/22: Repeat GI viral PCR positive for Norovirus    - 2/22 CMV 74    Plan:  - appreciate ID recs  -- Ganciclovir 5 mg/kg (1/31-2/21). S/p one dose of IVIG on 2/7.   -- S/p Valcyte 900 mg BID (2/21 -> 2/23) -> transitioned to maintenance Valcyte 476m BID   -- S/p Alinia (nitazoxanide) for 7 days (2/16 - 2/22) for norovirus treatment. Repeat GI viral PCR was persistently positive for Norovirus on 2/22, but ID said it is likely from colonization. No need to treat further  -- s/p IV Zosyn4.5 g Q8hrs (1/26-1/28, restarted  1/29 - 2/1 for recurrent fever)  -- GI consulted, s/p flex sig 2/2  -- Weekly CMV  -- Rifaximin for hepatic encephalopathy and SIBO as below     # Acute liver injury of unclear etiology  # Acuteon chronicencephalopathy likely 2/2 infectious vs hepaticvs metabolic  # Hyperbilirubinemia  # Anasarca  # Hypoalbuminemia    Data:  -Steady rise in LFTs in 05/2021,  -12/02/21: AST 152--> 121, ALT 130--> 110, TB 5.4, Albumin 1.1  -Hepatitis panel negative.  -1/26:  Tbili 14.6, GGT 1160, ammonia 25  -CTAP 12/02/21: Scattered sclerotic lesions in the lumbar spine, hepatic steatosis.   -RUQ ultrasound: liver enlarged with diffuse increased echogenicity, consistent with fatty infiltration. 2cm stone in the gallbladder.  -HIDA scan:Normal radiotracer filling of the gallbladder is identified, therefore the exam is not consistent with cystic duct obstruction or acute cholecystitis.  - [place] abd 1/27: Hepatomegaly with diffusely increased echogenicity which can be  seen with hepatic amyloidosis or steatosis. Please refer to the recently performed biopsy for further details. Mild perihepatic ascites.Patent hepatic vasculature with normal waveforms. Cholelithiasis without acute cholecystitis. Right pleural effusion.  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 CMV stains: Negative.  - 1/30: Serum studies: Ferritin 2380, iron serum 60, transferrin 46 (low), 93 % sat (high), HEV IgM negative, PCR forVZV negative, HSV negative,CMV+ 443,961High,EBV negative, AMA negative, Hepatitis B core Ab, surface Ab negative, Hep A IgG +/IgM negative  - 2/3 IgG subclass 4: 2 (total 444, subclasses 1/2/3 = 308/25/11 respectively)    Plan:  - Hepatology recs: 2/7: Path looks unlikely for vanishing bile duct syndrome. Could be drug related. Increase Ursodiol to 600 mg BID, Check Hepatitis E. 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 Unlikely diarrhea from Ursodiol.  -Avoiding steroids  - Daily LFT/INR  - RD consult placed to expand diet to more than protein shakes.  -- Family requesting appetite stimulant but risk > benefit.   - Expect improvement over weeks to months    # Acute toxic encephalopathy, improving  In the setting of liver failure, ammonia only 25. LP performed 2/1 to r/o encephalitis.     Data:   1/31: HIV, TSH, Folate, B12 WNL  1/30: CT head WNL other than sinus disease  2/1: LP results: Neutrophils 6% in tube 1, Lymphocytes 88% (predominant), protein 42 (normal), glucose 36 (low), rapid HSV in CSF negative. CMV CSF PCR negative  2/5: Brain MRI: Smooth diffuse dural thickening and enhancement as well as bilateral holohemispheric  subdural effusions, which may be on the basis of diminished intracranial pressure following recent lumbar puncture.  -2/8: EEG: non-specific mild cerebral dysfunction.  -2/9: CT Brain: similar to prior MRI with hyperdense areas likely hygromas    Plan:  - Neurology consult: 2/7: Encephalopathy likely from underlying medical disease. Delirium prevention. Avoid Ativan and try Seroquel for delirium. MRI findings is from LP. Okay to try caffiene tablets in the daytime to help with postural headache but patient is allergic to it.  -2/8: Repeat CT head to monitor the status of subdural effusion: stable, EEG: diffuse slowing, Thiamine iv prophylaxis.  -Speech path: Regular and thin liquid. Slightly higher risk for aspiration given encephalopathy.  - Start  empiric rifaximin given possibility of HE without elevated ammonia (2/14 ->)  -- Rifaximin dose adjusted to cover for SIBO, then de-escalated back to HE coverage on 2/22   -- holding off on lactulose given diarrhea    # Deconditioning  Pt has not been engaging much with PT/OT despite improved mental status. May be due in part to anxieties about diarrhea/incontinence.  - continue PT/OT  - continue encouragement to participate, particularly by family/familiar faces  - anticipate placement in SNF/ARU following inpatient admission    # Bloating  May be due to indigestion. Denies constipation. Exam not c/w significant ascites.  - mylanta, simethicone prn    # Hypokalemia:  # Hypomagnesemia:  Replete as needed.    # Vitamin D deficiency:  -Supplements ordered.    # Moderate malnutrition:  -Seen by RD and given recs for supplements.    # History of PE, diagnosed in April 2019  - hold home [place] for now givenrecent anemia and concerns for bleeding.  - monitor for signs and symptoms of worsening bleeding.     # Elevated TSH at OSH, likely 2/2 subclinical hypothyroidism  -TSH WNL on 12/15/20.     # Anxiety, emotional distress:  -Patient has stressful social situation,  intellectually disabled son, and regarding underlying disease process.    # Chronic buttocks pressure wound  - Wound care consult, appreciate recs  - Position changes as tolerated, waffle cushion, etc  -- Pressure wound ruled out, treating bilateral buttocks moisture and friction damage   - Rectal tube placed 2/18 to avoid contamination of wounds by stool, removed once diarrhea resolved    Wound care:  - Bilateral Groin/Thighs   Gently cleanse with warm water & Remedy no-rinse foam (pH-balanced cleanser) with soft cloths, pat dry ensuring   thoroughly dried.   Do not use CHG to rash areas.   Apply Clotrimazole ointment to rash BID, ensure thoroughly rubbed in.   Apply a piece of Interdry textile [phone]) leaving 2 inches exposed from fold to facilitate wicking.     - Bilateral Buttocks   First rinse with perineal irrigation bottle & warm water to remove as much stool as possible to minimize wiping.   Cleanse wounds & surrounding skin with Anasept wound cleanser spray (WUJ#[id number]), pat dry with sterile gauze.   Apply a thick layer of Triad hydrophilic wound dressing paste (NWG#[id number]) over wounds & periwound areas.   Promptly clean patient after episodes of incontinence using method described.   Remove only the top, soiled layer of Triad during incontinence cleaning to avoid further eroding the skin & apply another top   layer of Triad following cleaning.   Apply Triad at least BID & PRN to keep area protected.     - Pressure Injury Prevention   Continue q2h repositioning- 30 degree lateral turn to L & R only unless medically contraindicated to avoid direct pressure on   sacrum.   Place Waffle mattress overlay [phone]) under patient in bed to assist with pressure redistribution.   Use waffle cushion while OOB to chair, reposition frequently while in chair every 30 minutes.   Additionally, float heels completely off of bed. If pillows do not suffice, order heel offloading boots (PMM# [id number]).       Nutritional Assessment  Moderate Chronic disease or condition related malnutrition    The patient has the following conditions and comorbidities:  - Sepsis (present on admission)    - Malignancy associated fatigue on admission  - Neoplasm/malignancy associated pain  -  Hypoalbuminemia (present on admission)  - Fluid status: Hypovolemia on admission     - Cachexia  - Encephalopathy: Metabolic encephalopathy   - Pancytopenia 2/2 chemotherapy    Physical Exam at Discharge  BP 98/63 (BP Location: Right upper arm, Patient Position: Lying)   Pulse 73   Temp 36.7 C (98.1 F) (Oral)   Resp 16   Ht 153 cm (5' 0.24")   Wt 96 kg (211 lb 10.3 oz)   LMP  (LMP Unknown)   SpO2 96%   BMI 41.01 kg/m       Intake/Output Summary (Last 24 hours) at 01/15/2022 0954  Last data filed at 01/15/2022 0853  Gross per 24 hour   Intake 2056 ml   Output 1550 ml   Net 506 ml       Physical Exam  Vitals reviewed.   Constitutional:       Appearance: She is ill-appearing.   HENT:      Head: Normocephalic and atraumatic.      Nose: Nose normal.      Mouth/Throat:      Mouth: Mucous membranes are moist.      Pharynx: Oropharynx is clear.   Eyes:      General: Scleral icterus present.      Extraocular Movements: Extraocular movements intact.      Pupils: Pupils are equal, round, and reactive to light.   Cardiovascular:      Rate and Rhythm: Normal rate and regular rhythm.   Pulmonary:      Effort: Pulmonary effort is normal. No respiratory distress.   Abdominal:      General: Abdomen is flat. There is no distension.      Palpations: Abdomen is soft. There is no shifting dullness or fluid wave.      Tenderness: There is abdominal tenderness (mild, diffuse).   Musculoskeletal:      Cervical back: Normal range of motion and neck supple.      Right lower leg: No edema.      Braun lower leg: No edema.   Skin:     General: Skin is warm and dry.      Coloration: Skin is jaundiced.   Neurological:      Mental Status: She is alert and oriented to  person, place, and time.   Psychiatric:         Mood and Affect: Mood normal.         Behavior: Behavior is cooperative.         Relevant Labs, Radiology, and Other Studies  Lab Results (last 72 hours)     Procedure Component Value Units Date/Time    Comprehensive Metabolic Panel (BMP, AST, ALT, T.BILI, ALKP, TP, ALB) [[id number]]  (Abnormal) Collected: 01/15/22 0214    Order Status: Completed Specimen: Blood Updated: 01/15/22 0311     Albumin, Serum / Plasma 1.1 g/dL      Alkaline Phosphatase 341 U/L      Alanine transaminase 71 U/L      AST 81 U/L      Bilirubin, Total 8.7 mg/dL      Urea Nitrogen, Serum / Plasma 2 mg/dL      Calcium, total, Serum / Plasma 7.2 mg/dL      Chloride, Serum / Plasma 115 mmol/L      Creatinine 0.34 mg/dL      eGFRcr 117 mL/min/1.54m      Comment: Effective 02/11/2021, eGFRcr calculated using CKD-EPI (2021) equation without race coefficient (NKF-ASN recommendations).  Potassium, Serum / Plasma 3.5 mmol/L      Sodium, Serum / Plasma 143 mmol/L      Protein, Total, Serum / Plasma 3.1 g/dL      Carbon Dioxide, Total 23 mmol/L      Anion Gap 5     Comment: Anion gap is calculated as Na-(Cl+CO2)        Glucose, non-fasting 91 mg/dL     Magnesium, Serum / Plasma [[id number]] Collected: 01/15/22 0214    Order Status: Completed Specimen: Blood Updated: 01/15/22 0311     Magnesium, Serum / Plasma 1.9 mg/dL     Bilirubin, Direct [[id number]]  (Abnormal) Collected: 01/15/22 0214    Order Status: Completed Specimen: Blood Updated: 01/15/22 0311     Bilirubin, Direct 6.2 mg/dL     Fibrinogen, Functional [[id number]]  (Abnormal) Collected: 01/15/22 0214    Order Status: Completed Specimen: Blood Updated: 01/15/22 0310     Fibrinogen, Functional 155 mg/dL     Prothrombin Time [[id number]]  (Abnormal) Collected: 01/15/22 0214    Order Status: Completed Specimen: Blood Updated: 01/15/22 0305     PT 17.2 s      INR 1.5    Complete Blood Count with Differential [[id number]]  (Abnormal) Collected:  01/15/22 0214    Order Status: Completed Specimen: Whole Blood Updated: 01/15/22 0257     WBC Count 3.0 x10E9/L      RBC Count 2.39 x10E12/L      Hemoglobin 8.4 g/dL      Hematocrit 25.9 %      MCV 108 fL      MCH 35.1 pg      MCHC 32.4 g/dL      Platelet Count 47 x10E9/L      Comment: Repeated        MPV 14.0 fL      RDW-CV Unable to measure %      Abs Neutrophils 2.32 x10E9/L      Abs Lymphocytes 0.28 x10E9/L      Abs Monocytes 0.36 x10E9/L      Abs Eosinophils 0.01 x10E9/L      Abs Basophils 0.01 x10E9/L      Abs Imm Granulocytes 0.05 x10E9/L     Basic Metabolic Panel (NA, K, CL, CO2, BUN, CR, GLU, CA) [[id number]]  (Abnormal) Collected: 01/14/22 1731    Order Status: Completed Specimen: Blood Updated: 01/14/22 1928     Urea Nitrogen, Serum / Plasma 3 mg/dL      Calcium, total, Serum / Plasma 7.3 mg/dL      Chloride, Serum / Plasma 116 mmol/L      Creatinine 0.37 mg/dL      eGFRcr 115 mL/min/1.43m      Comment: Effective 02/11/2021, eGFRcr calculated using CKD-EPI (2021) equation without race coefficient (NKF-ASN recommendations).        Potassium, Serum / Plasma 3.6 mmol/L      Glucose, non-fasting 89 mg/dL      Sodium, Serum / Plasma 143 mmol/L      Carbon Dioxide, Total 21 mmol/L      Anion Gap 6     Comment: Anion gap is calculated as Na-(Cl+CO2)       Comprehensive Metabolic Panel (BMP, AST, ALT, T.BILI, ALKP, TP, ALB) [[[id number]] (Abnormal) Collected: 01/14/22 0257    Order Status: Completed Specimen: Blood Updated: 01/14/22 0442     Albumin, Serum / Plasma 1.1 g/dL      Alkaline Phosphatase 339 U/L      Alanine transaminase 70 U/L  AST 79 U/L      Bilirubin, Total 9.4 mg/dL      Urea Nitrogen, Serum / Plasma 3 mg/dL      Calcium, total, Serum / Plasma 7.2 mg/dL      Chloride, Serum / Plasma 115 mmol/L      Creatinine 0.32 mg/dL      eGFRcr 119 mL/min/1.81m      Comment: Effective 02/11/2021, eGFRcr calculated using CKD-EPI (2021) equation without race coefficient (NKF-ASN recommendations).         Potassium, Serum / Plasma 3.0 mmol/L      Sodium, Serum / Plasma 144 mmol/L      Protein, Total, Serum / Plasma 3.1 g/dL      Carbon Dioxide, Total 23 mmol/L      Anion Gap 6     Comment: Anion gap is calculated as Na-(Cl+CO2)        Glucose, non-fasting 82 mg/dL     Magnesium, Serum / Plasma [[[id number]]Collected: 01/14/22 0257    Order Status: Completed Specimen: Blood Updated: 01/14/22 0442     Magnesium, Serum / Plasma 1.8 mg/dL     Bilirubin, Direct [[[id number]] (Abnormal) Collected: 01/14/22 0257    Order Status: Completed Specimen: Blood Updated: 01/14/22 0442     Bilirubin, Direct 6.8 mg/dL     Prothrombin Time [[[id number]] (Abnormal) Collected: 01/14/22 0257    Order Status: Completed Specimen: Blood Updated: 01/14/22 0439     PT 16.2 s      INR 1.4    Fibrinogen, Functional [[[id number]] (Abnormal) Collected: 01/14/22 0257    Order Status: Completed Specimen: Blood Updated: 01/14/22 0439     Fibrinogen, Functional 168 mg/dL     Complete Blood Count with Differential [[[id number]] (Abnormal) Collected: 01/14/22 0257    Order Status: Completed Specimen: Whole Blood Updated: 01/14/22 0429     WBC Count 2.6 x10E9/L      RBC Count 2.40 x10E12/L      Hemoglobin 8.6 g/dL      Hematocrit 26.4 %      MCV 110 fL      MCH 35.8 pg      MCHC 32.6 g/dL      Platelet Count 43 x10E9/L      Comment: Repeated        MPV 14.2 fL      RDW-CV Unable to measure %      Abs Neutrophils 2.10 x10E9/L      Abs Lymphocytes 0.19 x10E9/L      Abs Monocytes 0.28 x10E9/L      Abs Eosinophils 0.00 x10E9/L      Abs Basophils 0.01 x10E9/L      Abs Imm Granulocytes 0.02 x10E9/L     Basic Metabolic Panel (NA, K, CL, CO2, BUN, CR, GLU, CA) [[[id number]] (Abnormal) Collected: 01/13/22 1818    Order Status: Completed Specimen: Blood Updated: 01/13/22 1917     Urea Nitrogen, Serum / Plasma 3 mg/dL      Calcium, total, Serum / Plasma 7.2 mg/dL      Chloride, Serum / Plasma 116 mmol/L      Creatinine 0.36 mg/dL      eGFRcr 115 mL/min/1.778m      Comment: Effective 02/11/2021, eGFRcr calculated using CKD-EPI (2021) equation without race coefficient (NKF-ASN recommendations).        Potassium, Serum / Plasma 3.8 mmol/L      Glucose, non-fasting 86 mg/dL      Sodium, Serum / Plasma 144 mmol/L  Carbon Dioxide, Total 21 mmol/L      Anion Gap 7     Comment: Anion gap is calculated as Na-(Cl+CO2)       Complete Blood Count with Differential [[id number]]  (Abnormal) Collected: 01/13/22 0446    Order Status: Completed Specimen: Whole Blood Updated: 01/13/22 0633     WBC Count 2.8 x10E9/L      RBC Count 2.39 x10E12/L      Hemoglobin 8.3 g/dL      Hematocrit 27.0 %      MCV 113 fL      MCH 34.7 pg      MCHC 30.7 g/dL      Platelet Count 45 x10E9/L      MPV 13.4 fL      RDW-CV Unable to measure %      Abs Neutrophils 2.27 x10E9/L      Abs Lymphocytes 0.20 x10E9/L      Abs Monocytes 0.28 x10E9/L      Abs Eosinophils 0.00 x10E9/L      Abs Basophils 0.00 x10E9/L      Abs Imm Granulocytes 0.03 x10E9/L     Comprehensive Metabolic Panel (BMP, AST, ALT, T.BILI, ALKP, TP, ALB) [[id number]]  (Abnormal) Collected: 01/13/22 0446    Order Status: Completed Specimen: Blood Updated: 01/13/22 0626     Albumin, Serum / Plasma 1.1 g/dL      Alkaline Phosphatase 335 U/L      Alanine transaminase 70 U/L      AST 76 U/L      Bilirubin, Total 9.5 mg/dL      Urea Nitrogen, Serum / Plasma 3 mg/dL      Calcium, total, Serum / Plasma 7.1 mg/dL      Chloride, Serum / Plasma 114 mmol/L      Creatinine 0.33 mg/dL      eGFRcr 118 mL/min/1.66m      Comment: Effective 02/11/2021, eGFRcr calculated using CKD-EPI (2021) equation without race coefficient (NKF-ASN recommendations).        Potassium, Serum / Plasma 3.1 mmol/L      Sodium, Serum / Plasma 144 mmol/L      Protein, Total, Serum / Plasma 3.2 g/dL      Carbon Dioxide, Total 25 mmol/L      Anion Gap 5     Comment: Anion gap is calculated as Na-(Cl+CO2)        Glucose, non-fasting 79 mg/dL     Magnesium, Serum / Plasma [[[id number]]Collected:  01/13/22 0446    Order Status: Completed Specimen: Blood Updated: 01/13/22 0626     Magnesium, Serum / Plasma 2.0 mg/dL     Bilirubin, Direct [[[id number]] (Abnormal) Collected: 01/13/22 0446    Order Status: Completed Specimen: Blood Updated: 01/13/22 0626     Bilirubin, Direct 7.1 mg/dL     Prothrombin Time [[[id number]] (Abnormal) Collected: 01/13/22 0446    Order Status: Completed Specimen: Blood Updated: 01/13/22 0616     PT 16.6 s      INR 1.4    Fibrinogen, Functional [[[id number]] (Abnormal) Collected: 01/13/22 0446    Order Status: Completed Specimen: Blood Updated: 01/13/22 0616     Fibrinogen, Functional 160 mg/dL     Basic Metabolic Panel (NA, K, CL, CO2, BUN, CR, GLU, CA) [[[id number]] (Abnormal) Collected: 01/12/22 [id number]    Order Status: Completed Specimen: Blood Updated: 01/12/22 1723     Urea Nitrogen, Serum / Plasma 3 mg/dL      Calcium, total, Serum / Plasma 7.5  mg/dL      Chloride, Serum / Plasma 115 mmol/L      Creatinine 0.34 mg/dL      eGFRcr 117 mL/min/1.5m      Comment: Effective 02/11/2021, eGFRcr calculated using CKD-EPI (2021) equation without race coefficient (NKF-ASN recommendations).        Potassium, Serum / Plasma 3.8 mmol/L      Glucose, non-fasting 87 mg/dL      Sodium, Serum / Plasma 143 mmol/L      Carbon Dioxide, Total 24 mmol/L      Anion Gap 4     Comment: Anion gap is calculated as Na-(Cl+CO2)             Procedures Performed and Complications  14/78PICC  1/27 IR liver biopsy  2/1 LP    Nutritional Assessment  Moderate Chronic disease or condition related malnutrition    During this hospital stay, the patient was treated for the following conditions:   - Sepsis (present on admission)    - Malignancy associated fatigue on admission  - Neoplasm/malignancy associated pain  - Hypoalbuminemia (present on admission)  - Fluid status: Hypovolemia on admission     - Cachexia  - Encephalopathy: Metabolic encephalopathy   - Pancytopenia 2/2 chemotherapy    I spent 45 minutes preparing  discharge materials, prescriptions, follow up plans, and face-to-face time with the patient/family discussing inpatient findings/plans.    DISCHARGE INSTRUCTIONS    Discharge Diet  Regular Diet    PHYSICAL THERAPY ASSESSMENTS AND RECOMMENDATIONS AT DISCHARGE   Available equipment or existing home modifications:  No DME.  Prior functional limitations:  Is normally independent with ambulation and ADLs. Prior to OSH admit on 1/18, patient mostly in bed at home.  Rehab potential:  Patient participates well in therapy and progressing towards goals  Discharge Activity comments:  Bed in chair position, OOB to chair with ceiling lift    Functional Assessment at Discharge/Activity Goals  Patient Discharge Activity     Mobilize with assistance   As directed      Instructions: Prior Level of Function:    Prior Living Environment Comments: [facility]. Has stairs to enter.  Available equipment or existing home modifications: No DME.  Prior functional limitations (device for gait, ADL, IADL, vision, cognition) : Independent without AD.    Nursing Recommendations:    Rehab Assistive Device Recommendation: Vertical dependent lift, Ceiling lift  Activity recommendations comments: Bed in chair position, OOB to chair with ceiling lift      Up to chair for all meals; unless on bedrest; 3 Times Daily   As directed            Allergies and Medications at Discharge    Allergies: Diphenoxylate-atropine and Caffeine    Your Medications at the End of This Hospitalization             0.9% sodium chloride flush syringe Inject 3 mL into the vein 2 (two) times daily as needed (line flush after each use)    acyclovir (ZOVIRAX) 400 mg tablet Take 1 tablet (400 mg total) by mouth Twice a day    albuterol (PROVENTIL) 2.5 mg /3 mL (0.083 %) inhalation solution Use 3 mL (2.5 mg total) by nebulization every 4 (four) hours as needed (wheezing/shortness of breath)    aluminum-magnesium  hydroxide-simethicone (MAALOX PLUS) 200-200-20 mg/5 mL suspension Take 30 mL by mouth every 8 (eight) hours as needed for Indigestion (  heartburn, bloating (1st))    cholecalciferol, vitamin D3, 1000 UNITS tablet Take 4 tablets (4,000 Units total) by mouth daily    clotrimazole (LOTRIMIN) 1 % cream Apply topically Twice a day Thighs and groin    dextrose 5 % and 0.9 % sodium chloride infusion Inject 75 mL/hr into the vein Continuous    dicyclomine (BENTYL) 10 mg capsule Take 2 capsules (20 mg total) by mouth in the morning and 2 capsules (20 mg total) at noon and 2 capsules (20 mg total) in the evening and 2 capsules (20 mg total) before bedtime.    dry mouth oral rinse (BIOTENE DRY MOUTH) solution 5 mL by Mucous Membrane route in the morning and 5 mL at noon and 5 mL in the evening and 5 mL before bedtime.    HYDROmorphone (DILAUDID) 0.5 mg/0.5 mL injection Inject 0.3-0.8 mL (0.3-0.8 mg total) into the vein every 4 (four) hours as needed (first line pain)    hydrOXYzine (ATARAX) 10 mg tablet Take 1 tablet (10 mg total) by mouth every 8 (eight) hours as needed for Anxiety    loperamide (IMODIUM A-D) 2 mg tablet Take 2 tablets (4 mg total) by mouth 4 (four) times daily as needed for Diarrhea    melatonin 3 mg tablet Take 2 tablets (6 mg total) by mouth nightly at bedtime    multivitamin complete chewable (FLINTSTONE'S COMPLETE) chewable tablet Chew 1 tablet by mouth daily    ondansetron (ZOFRAN) 4 mg/2 mL injection Inject 4 mL (8 mg total) into the vein every 8 (eight) hours as needed (nausea/vomiting)    rifAXIMin (XIFAXAN) 550 mg tablet Take 1 tablet (550 mg total) by mouth 2 (two) times daily    simethicone (MYLICON) 80 mg chewable tablet Chew 1 tablet (80 mg total) by mouth 2 (two) times daily as needed (Bloating alone)    thiamine (VITAMIN B1) 100 mg/mL injection Inject 2 mL (200 mg total) into the vein Once a day    ursodiol (ACTIGALL) 60 mg/mL syrup Take 10 mL (600 mg total) by mouth 2 (two) times daily     valGANciclovir (VALCYTE) 450 mg tablet Take 1 tablet (450 mg total) by mouth 2 (two) times daily    vitamin E, dl,tocopheryl acet, (VITAMIN E, DL, ACETATE,) 45 mg (100 unit) capsule Take 8 capsules (800 Units total) by mouth daily          Discharge With Opioid Medications for:  ACUTE PAIN  According to our records, this patient was not taking opioid medications before this hospitalization.  We do not plan to manage refill and the Primary Care Provider should manage refills.  Please evaluate with each refill of an opioid that your patient meets the criteria for ongoing opioid therapy with the goal to avoid chronic opioid use. Please ensure the patient knows about alternatives for pain management and understands how to taper opioids.    CURES database was reviewed and consistent with expected prescribing.        Pending Tests   Pending Labs     Order Current Status    Cytomegalovirus DNA, Quantitative PCR, Plasma In process      None    Outside Follow-up   Contact information for other follow-up providers     [facility] Follow up.    Contact information  [place] [address]  [phone]  Contact information for after-discharge care     [facility] .    Service: [facility] information  [place]                            Booked [facility] Appointments  No future appointments.    Pending [facility] Referrals  None    Case Management Services Arranged  Case Management Services Arranged: (all recorded)     Outside Services Arranged For [facility] Name         LTACH/Hospital    Name of Agency/Facility --    Address Type (copy to patient address) --    Street address --    [place] --    Zip Code --    Phone number --    Fax number --    Authorization # --    MD to MD discussion completed --    RN to RN report phone number --    Start  Date for Services --    Additional Instructions --                Discharge Assessment  Condition at discharge:  good  Final Discharge Disposition: Other [facility]     Name Date    Pfizer Sars-Cov-2 Vaccine ([place]) 08/14/2020    Manufacturer: Unknown Manufacturer    Lot: [id number]    External: Auto Reconciled From Outside Source    Pfizer Sars-Cov-2 Vaccine ([place]) 03/18/2020    Manufacturer: Unknown Manufacturer    Lot: [id number]    External: Auto Reconciled From Outside [facility] Documentation during this hospitalization:    Code Status: FULL    Last [place] (Valid for this hospitalization only)     None        Advance directive, POLST, or Living Will Documents -- Patient Level:    Advance directive, POLST, or Living Will Documents: None found at the patient level.           Advance Care Planning Documentation       Primary Care Physician  Eldridge Scot  Address: [address] [place] [address]   Phone: [phone]  Fax: [phone]     I spent 45 minutes preparing discharge materials, prescriptions, follow up plans, and face-to-face time with the patient/family.    Outside Providers, for pending tests please use the following numbers:   For [facility] - Please Call: [phone]    For [facility] - Please Call: [phone]   For  Pathology - Please Call: [phone]    Signed,  Deboraha Sprang, MD  01/15/22        Discharge Instructions provided to the patient (if any):    Discharge Instructions    Call clinic immediately for the following: [phone])  Fever greater than 38o C or 100.4o F and/or shaking chills   Shortness of breath   Severe cough   Bleeding that does not stop on its own  Nausea/vomiting that does not get better with nausea medications  Inability to take oral medications  3 or more loose or liquid stools per day  If you have fallen *(see below)  Concerns  about your central line    Do NOT use My Chart to communicate any of these symptoms.    ALWAYS CALL 911 for emergencies, such as:  New or increase in chest pain  New or increase in shortness of breath  Changes in mental status, speech, ability to walk  *If you have fallen and hit your head, are bleeding, unable to walk or move extremities, or are in severe pain.    Do NOT use My Chart to communicate any of these symptoms.    General tips:   A balance of activities is important. Be sure to gradually increase your exercise, but avoid overexertion & exhaustion.  Hand-washing is important. Be sure that you and the others you live with wash their hands frequently and always before preparing food or after using the bathroom.  You can resume a regular diet unless instructed otherwise by your doctor or nurse practitioner.  Please arrive 1 hour before your scheduled MD, NP or PIC ([place]) appointment to have your labs drawn, unless you are told otherwise. This allows time for labs results to come in prior to your appointment.   Prescription refills can take time to process. Please request refills before discharge from the hospital, or from the clinic at least 1 week before you run out of your medication.  You should have your follow up visit scheduled within 24 hours (1 business day) of being discharged from the hospital. If you do not hear from our discharge coordinator within this time, please call our main line ([phone]), press #1, then #3 for an MD or NP appointment or #4 for a PIC (infusion) appointment. You can ask to speak with the discharge coordinator when you reach a live person.           Patient Instructions    None

## 2022-01-15 NOTE — Interdisciplinary (Signed)
CASE MANAGEMENT DISCHARGE     CASE MANAGEMENT DISCHARGE (most recent)     Discharge Note Flowsheet - 12/08/21        Final Discharge Note    *Primary Case Manager (First and Last Name) Pam Drown     Final Discharge Disposition Other [facility]        LTACH/Hospital    MD to MD discussion completed Yes   Dr. Lisa Roca    RN to RN report phone number [phone], Rn to Rn: [phone]        Transportation Arrangements    Date of Transport 01/15/22     Time of Transport 1100     Transportation arrangements Case Management/Social Work coordinated Warehouse manager ([place] --   Transfer back                 Contact information for follow-up providers     [place]    [place] [address]   Phone: [phone]       Next Steps: Follow up          Contact information for after-discharge care            [place]    [place] [address]                       CM sw Elta [place] from [facility] transfer center, who gave above bed information.  Packet w/CD sent w/pt.  BS RN alerted pt and family of transfer back.    Pam Drown  MSN, RN, CM  [facility] Case Management  Hematology, Oncology

## 2022-01-22 NOTE — Progress Notes (Deleted)
Patient Name:  Ariel Braun    DOB:  24-Sep-1961    MEDICAL RECORD NUMBER [id number]    Date of Visit: 01/22/2022     Today I saw Ariel Braun in the [facility], in follow up regarding myeloma. She has been referred internally by Dr. Eliberto Ivory. I have reviewed her medical records from [name] and will summarize them here for our records.    CHIEF COMPLAINT: Myeloma    SUMMARY OF HEMATOLOGIC HISTORY:  Ariel Braun is a 61 y.o. female who presents with myeloma. 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 M-spike 32, kappa 596.9 mg/L, IgG 4750, b2M 3.88. She was treated with VRd x4 (achieving VGPR), then MEL 200 ASCT 05/12/11, then observation alone. 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 She met criteria for PD on 02/19/2020 and was started on third-line DARA-Pd in June 2021.       INTERVAL HISTORY:     - Started DARA-Pd 04/2020 (pom 81m 21/28 days starting 05/14/20, dara starting 05/16/2020), which she is tolerating well. No allergic reactions or symptoms of an HAE flare.      - Issues currently include problems sleeping and constipation. She attributes her insomnia to anxiety about her disease, her son Ariel Braun(who is intellectually disabled and has hyperparathyroidism with several kidney stones but he is getting better), and fears of COVID. Has been busy for her due to her son, Ariel Braun's med issues.      - She is the sole caregiver to her husband (diabetes) and son (as above), and unfortunately her family and church have not been as supportive as she had hoped.     Since 06/20/21, after getting covid vaccine, has been having  watery diarrhea episodically, helped by imodium. Takes prevalite which hasn't helped. Started when she started her covid vaccines and with each vax she would have diarrhea, but usually stops after a few days.  With her last vaccine in August, the diarrhea persists. Stool slightly formed while on dex,  but returns to watery after a few days. Prior to august, no diarrhea with Pom. 10/04/21 stool studies + Norovirus.  Continues to have diarrhea daily.  Feels very weak.    Stressed with son's brain tumor diagnosis, her focus is on him currently.    On VV with daughter Ariel [employer] lower, in bed most of the time.  Cannot even sit up in bed to eat.  Unable to eat solids.  Only eating liquids. Unable to stand, legs weak.  Feet are swollen. Less watery stools, less freq. Abd cramping helped with Bentyl.    Sx seem to be much worse in the past week.  Confused, struggles to complete sentences. No fever, infection symptoms.     Has not seen any providers in person recently. She last had dara 1st week of Dec.     Denies bone pain, denies leg numbness. No incontinence but requires bed pan for urination and bms as she is unable to get to the bathroom.     Daughter amenable to figuring how [name], otherwise will call 911.    [name]  [name], AMS, and persistent diarrhea.  [facility]  requiring intermittent pressor support and acute liver failure. 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 She was treated with nitazoxanide x7 days for norovirus, currently remains colonized but no futher tx per ID.  Treated with Ganciclovir and IVIG for CMV, now on maintenance Valcyte 450 bid.     ALLERGIES:  Allergies/Contraindications   Allergen Reactions    Diphenoxylate-Atropine Abdominal Pain     Severe stomach pain     Caffeine Anxiety     "Jitters"       MEDICATIONS:  Current Outpatient Medications   Medication Sig Dispense Refill    0.9% sodium chloride flush syringe Inject 3 mL into the vein 2 (two) times daily as needed (line flush after each use)       acyclovir (ZOVIRAX) 400 mg tablet ON HOLD FOR NOW while on valganciclovir 180 tablet 3    albuterol (PROVENTIL) 2.5 mg /3 mL (0.083 %) inhalation solution Use 3 mL (2.5 mg total) by nebulization every 4 (four) hours as needed (wheezing/shortness of breath)      aluminum-magnesium hydroxide-simethicone (MAALOX PLUS) 200-200-20 mg/5 mL suspension Take 30 mL by mouth every 8 (eight) hours as needed for Indigestion (heartburn, bloating (1st))      cholecalciferol, vitamin D3, 1000 UNITS tablet Take 4 tablets (4,000 Units total) by mouth daily      clotrimazole (LOTRIMIN) 1 % cream Apply topically Twice a day Thighs and groin      dextrose 5 % and 0.9 % sodium chloride infusion Inject 75 mL/hr into the vein Continuous      dicyclomine (BENTYL) 10 mg capsule Take 2 capsules (20 mg total) by mouth in the morning and 2 capsules (20 mg total) at noon and 2 capsules (20 mg total) in the evening and 2 capsules (20 mg total) before bedtime.      dry mouth oral rinse (BIOTENE DRY MOUTH) solution 5 mL by Mucous Membrane route in the morning and 5 mL at noon and 5 mL in the evening and 5 mL before bedtime.      HYDROmorphone (DILAUDID) 0.5 mg/0.5 mL injection Inject 0.3-0.8 mL (0.3-0.8 mg total) into the vein every 4 (four) hours as needed (first line pain)  0    hydrOXYzine (ATARAX) 10 mg tablet Take 1 tablet (10 mg total) by mouth every 8 (eight) hours as needed for Anxiety      loperamide (IMODIUM A-D) 2 mg tablet Take 2 tablets (4 mg total) by mouth 4 (four) times daily as needed for Diarrhea      melatonin 3 mg tablet Take 2 tablets (6 mg total) by mouth nightly at bedtime      multivitamin complete chewable (FLINTSTONE'S COMPLETE) chewable tablet Chew 1 tablet by mouth daily      ondansetron (ZOFRAN) 4 mg/2 mL injection Inject 4 mL (8 mg total) into the vein every 8 (eight) hours as needed (nausea/vomiting)      rifAXIMin (XIFAXAN) 550 mg tablet Take 1 tablet (550 mg total) by mouth 2 (two) times daily       simethicone (MYLICON) 80 mg chewable tablet Chew 1 tablet (80 mg total) by mouth 2 (two) times daily as needed (Bloating alone)      thiamine (VITAMIN B1) 100 mg/mL injection Inject 2 mL (200 mg total) into the vein Once a day 25 mL     ursodiol (ACTIGALL) 60 mg/mL syrup Take 10 mL (600 mg total) by mouth 2 (two) times daily  valGANciclovir (VALCYTE) 450 mg tablet Take 1 tablet (450 mg total) by mouth 2 (two) times daily      vitamin E, dl,tocopheryl acet, (VITAMIN E, DL, ACETATE,) 45 mg (100 unit) capsule Take 8 capsules (800 Units total) by mouth daily       No current facility-administered medications for this visit.       PAST MEDICAL HISTORY:  Past Medical History:   Diagnosis Date    Acid reflux disease     Anxiety     Depression     Hereditary angioedema (CMS code)     Multiple myeloma, without mention of having achieved remission     IgG kappa    Myeloma (CMS code) 05/10/2011       PAST SURGICAL HISTORY:  Past Surgical History:   Procedure Laterality Date    AUTOLOGOUS STEM CELL TRANSPLANTATION  05/12/11    BREAST CYST EXCISION  1981   R breast surgery on 10/02/20 (path negative)    FAMILY HISTORY:  Family History   Problem Relation Name Age of Onset    Diabetes Mother      Hypertension Mother      Colon cancer Father      Hypertension Brother ##Brother1        SOCIAL HISTORY:  Social History     Socioeconomic History    Marital status: Married   Tobacco Use    Smoking status: Unknown   Substance and Sexual Activity    Sexual activity: Not Currently       PHYSICAL EXAM:  PHYSICAL EXAM VIA VIDEO VISIT:  ECOG Performance Status: 1 - Symptomatic but completely ambulatory   On VV, patient is laying in bed and speaking slowly, difficulty with word finding. Cannot complete sentences.   Vital Signs: No vitals  General:  In NAD, but appears weak and confused  Respiratory:  Normal breathing, No respiratory distress        LABORATORY DATA:  Serum PEP Interp.   Date Value Ref Range Status    12/14/2021 Abnormal (A) Normal pattern. Final     Comment:     Hypoalbuminemia and hypogammaglobulinemia with a protein spike in the gamma region, consistent with a monoclonal protein, possibly daratumumab. Paraprotein spike = 0.2 g/dL.  Interpretation by Pathologist: Clelia Schaumann, M.D.       M-Protein (monoclonal), Serum   Date Value Ref Range Status   12/14/2021 0.2 (A) Negative g/dL Final     SPEP - External   Date Value Ref Range Status   08/04/2020 0.8  Final     Immunofixation Electrophoresis, serum   Date Value Ref Range Status   12/14/2021 IgG Kappa (A) Negative Final     Comment:     Paraprotein present, IgG Kappa. The IgG Kappa band is migrating in a region where daratumumab is known to localize on immunofixation.  If this patient is receiving daratumumab treatment, it is possible that the IgG Kappa band represents daratumumab   rather than an endogenous monoclonal protein.  Interpretation by Pathologist: Clelia Schaumann, M.D.       Lab Results   Component Value Date    IgA, Serum 20 (L) 10/13/2021    IgA - External 12 08/04/2020    IgG 585 (L) 10/13/2021    IgG, serum 745 01/01/2022    IgG - External 989 08/04/2020    IgM, Serum 7 (L) 10/13/2021    IgM - External 14 08/04/2020    Kappa (Serum, Free) - External 70.6  08/04/2020    Kappa Light Chain, Serum, Free 16.6 12/14/2021    Lambda (Serum, Free) - External 3.9 08/04/2020    Lambda Light Chain, Serum, Free 2.7 (L) 12/14/2021    Kappa/Lambda Ratio, Serum, Free 6.15 (H) 12/14/2021          PATHOLOGY:  As noted above.    RADIOLOGY:  As noted above.    ASSESSMENT &PLAN  The patient is a 61 y.o. female who presents for evaluation of myeloma.    # IgG kappa MM: Prior lines of therapy include (1) VRd --> MEL ASCT --> observation, (2) Rev --> Rd, and now (3) DARA-Pom/dex started 04/2020 for M-spike rising to 1.1. Now down to 0.8 as of 07/07/2020, although some of this may be DARA interference of course. Had brief increase in [facility] around time of breast  surgery 09/2020 (when surgery was on hold), but then resumed thereafter. 02/2021 SPEP M protein and SFLC downtrending and 08/18/2021 MM labs stable   - Patient has history of allergic reactions and HAE (was monitored 1-3 hrs after first doses, got Singulair pre-med initially) but no issues so far with DARA.   - HOLD DARA-Pd for 2-4 weeks due to Norovirus and persistent diarrhea  DARA once a month (last had this 1st week of Dec)  Pom 35m daily, 21 of 28 days (HOLD)  Dex once weekly, 21 of 28 days (HOLD)- we talked about stopping the dex but the pt wants to continue for now as it helps with the diarrhea  - no recent labs  - Continue monthly MM labs  - next line will consider CFZ/seli/dex but will need to discuss if seli AEs would be ok with her     #FTT: patient's performance status has been declining over the last 2-3 months, but is difficult to fully appreciate as our visit have been via telehealth.  Today, patient is in bed and and unable to have a proper conversation.  She is unable to answer questions appropriately. She needs stat labs, infection work up and brain imaging.  - urged patient's daughter Ariel Billowto call 911 and have [name]  - I will communicate with [name] for proper sign out and FYI Dr. JIona Coach - ED, please order cbc, cmp, ldh, spep, sIFE, serum free light chains, immunoglobulins, UA and culture, brain imaging    #Elevated LFTs: slightly increase since 05/2021  - CTM  - consider liver ultrasound    #Diarrhea: unclear cause, but patient first noted it after covid vaccinations. Not helped with prevalite but imodium helps. 08/27/21 CDiff and O+P negative, but + for norovirus.  - supportive care for norovirus  - recommend holding myeloma therapy for 2 weeks   - Dr. JIona Coachrx'ed lomotil which she has not used  - follow up GI  - bentyl prn for abdominal pain    # Anxiety, emotional distress: Causes of stress include her role as sole caregiver to her  son (intellectually disabled, severe hyperparathyroidism), husband (diabetes), and herself. Also stressed about the myeloma itself and its prognosis, and also about COVID.   - follow up SMS    - Can consider oncopsychology referral down the line    # Constipation: resolved.  Currently with diarrhea.    # Gr3 neutropenia: On 11/06/20 labs ([id number]) isolated. Possibly pom-related. No fevers or infectious symptoms. ANC is typically borderline, around 1000.    - Patient advised to monitor for infection symptoms  and notify local hematologist immediately  - continue zarxio for [place] <1000   - Continue to monitor    # R breast lesion: Noted on 12/2019 PET-CT, then confirmed on 04/2020 R breast US. She got the breast biopsy 07/08/2020 showed papillary lesion, recommending excisional biopsy which returned as benign, per patient   - follow up Dr. Iona Coach (local oncologist) and Dr. Maudie Mercury (surgeon)    # Hereditary angioedema:   - Follows with allergist locally   - No issues with DARA-Pd    # History of PEs: Occurred while on Revlimid.   - Continue low-dose Eliquis indefinitely, particularly while on Pom    #Bone health  - if she did not finish 2 years of zometa, then would recommend finishing a total of 2 years AFTER she finishes the dental work she needs currently    [facility]  - consider colonoscopy when ready    #Ppx:  - Continue acyclovir   - Continue Eliquis  - s/p [place] #1 02/26/20, #2 03/18/20, $3 08/14/20, #4 02/02/21, #5 06/20/2021, bivalent booster due now  - flu vaccine recommended  - 11/06/20 covid ab neg  - repeat covid Ab 03/03/21 was neg   - s/p evusheld 04/01/21, recommend repeat dosing now    #Follow-up:  Monthly visits with Local Doc; 2 months with Dr. Jacelyn Grip      Thank you for involving me in the care of your patient. Please do not hesitate to call me anytime to discuss this patient.    Kathrynn Running NP        Cc:    Dr. Iona Coach (Heme)  Dr. Herbert Moors (Surgeon)          This patient has a high complexity problem: multiple myeloma,  which is an incurable malignancy and poses a threat to life and bodily function.        Regarding data complexity, I have reviewed the following tests: CBC, CMP, SPEP, light chains     Medical decision making: high risk.  Advised continue holding Dara/Pom/Dex  for multiple myeloma due to active infection and now FTT.  The patient will continue on this therapy until progression or intolerance and therefore must  be closely monitored for toxicities, which may include low blood counts, infections, clots, secondary malignancies, GI toxicities, peripheal neuropathy, fatigue.  The patient was extensively counseled on this and agrees to continue with the treatment plan.     Communication sent to local hematologist and awaiting return call.  Will communicate with [facility] ED once patient is amenable to go.     I performed this evaluation using real-time telehealth tools, including a live video Zoom connection between my location and the patient's location. Prior to initiating, the patient consented to perform this evaluation using telehealth tools.

## 2022-02-01 NOTE — Telephone Encounter (Signed)
I called the daughter to check in on Mrs. Quincy Simmonds. She continues to be hospitalized and the daughter understands that she should reach out to [place] upon discharge so that we can reschedule her appointment (which has been cancelled).    I advised her to request monthly MM panels while the mother is admitted. The last panel was done on 01/22/2022.    This note will be routed to Dr. Jacelyn Grip and Trish Mage, NP.

## 2022-03-11 NOTE — Progress Notes (Signed)
Hematology, Blood & Marrow Transplant, and Cellular Therapy [facility]AT[facility]   Patient: Ariel Braun (MRN [id number])  Referring MD and contact phone number or email: Dr. Hervey Ard [phone])  Referring [facility]: Patient with suspected acute leukemia OR acute hematologic condition at an OSH.    BACKGROUND:    Follows with [place], Dr. Kennon Rounds.     Ariel Braun is a 61 y.o. female w/ IgG Kappa MM (s/p VRd x4 achieving VGPR; Mel 200 AutoHCT 05/12/11; c/b PD 03/2014 started on signle-agent revlimid 2nd-line c/b PD 02/2020; 3rd line DARA c/b PD in 04/2020, then [name] (last dose 10/20/21 due to infection and acute liver failure), with prolonged hospitalization since 12/02/21 (see below) for hypotension/shock in s/o norovirus and CMV viremia/colitis (both now treated).     Per Dr. Hervey Ard (03/11/22), feels like the diarrhea is improving with prn medications and s/p treatment for norovirus. Has remained on treatment with CMV viremia/colitis c/b mild leukopenia nadir WBC 0.95 (02/15/22) (WBC now improved), pt currently off valganciclovir x2 weeks after discussion with their inpatient ID. The larger issues have been surrounding nutrition as pt wasn't eating much, for which pt underwent PEG tube 03/01/22 with tube feeds. Since PEG tube placement, pt has been having episodes of hypotension requiring brief ICU stay, and currently being transferred back to ICU. States is very lethargic and responding only to basic stimuli. Pt's daughter requesting transfer to [place] per preference.     Of note, 02/23/22 SPEP with stable M-spike, SFLC decreased Kappa 44.2 / Lambda 5.5 = K/L ratio = 8.04 (overall downtrending still).     12/02/21-  12/09/21 [facility]: presented with hypovolemic shock 2/2 norovirus [name] (phenylephrine). MRCP performed with hepatic steatosis; RUQ US showed hepatomegaly with cholelithiasis (2 cm). HIDA scan performed that  did not show e/o cystic duct obstruction or acute cholecystitis. Pt was subsequently transferred to [facility] for further management of acute liver failure (AST 118, [name], Alp 958, TB 12.1).     12/09/21-  01/15/22 [place] Admission: for further evaluation of acute liver failure and shock. AM cortisol levels wnl so no e/o adrenal insufficiency. Pt was subsequently found to have e/o CMV colitis with CMV PCR (12/13/21) being elevated at 443,961 -> 179,555 (12/17/21) -> 74 (01/06/22). GI path panel (12/15/21) only positive for norovirus. LP performed 12/16/21 with 6% neutrophils, 88% lymphocytes, protein 42 nl, glucose 36 low, [name]; CMV CSF PCR neg. Pt underwent flex sig with GI (12/17/21) and biopsies showed rare small non-necrotizing mucosal granuloma and rare CMV immunolabeling (no active colitis); [name], but on repeat CMV stain there is more convincing labeling of rare epithelial cells and possibly endothelial cells and/or lymphocytes to raise consideration for a CMV contribution to diarrhea. [place], [name], [name], pt receive ganciclovir 5 mg/kg (01/31-02/21/23) and 1 dose of IVIg (12/22/21), the transitioned to valcyte 900 mg BID (02/21-02/23/23) -> valcyte 450 mg BID thereafter. Also s/p nitazoxanide x7d course (02/16-02/22/23) for norovirus treatment, with repeat GI viral PCR persistently positive for Norovirus (01/06/22; per ID likely from colonization).        From a liver injury standpoint, RUQ [place] (12/11/21) showed hepatomegaly; cholelithiasis without acute cholecystitis. 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 Ursodiol was  increased to 600 mg BID per hepatology recommendations with expectation in improvement over weeks to months. Pt did have associated acute toxic encephalopathy for which neurology was consulted. LP neg. EEG with diffuse slowing. Pt given empiric rifaximin given possibility of HE.     01/15/22-  Present [place] Admission: pt was transferred back from [facility] for SNF placement. Pt developed a UTI for which was treated with course of CTX. Due to norovirus stool PCR being positive again, pt was re-treated with nitazoxanide x7d course. IV ganciclovir was also re-initiated empirically 01/27/22 given hx of CMV, but transitioned to valganciclovir 400 mg BID indefinitely. Pt began OTC immodium on 02/10/22. Pt was reportedly not working with physical therapy for SNF, re-evaluation pending and thus was needing higher level of care though family (daughter) without the financial resources to do so. Reportedly PEG tube placed 03/01/22 and initiated on tube feeds. Course further c/b hypotension requiring ICU level of care 03/03/22 - 03/09/22; again being transferred to ICU 03/11/22.         ASSESSMENT:  Ariel Braun is a 61 y.o. female w/ IgG Kappa MM (s/p VRd x4 achieving VGPR; Mel 200 AutoHCT 05/12/11; c/b PD 03/2014 started on signle-agent revlimid 2nd-line c/b PD 02/2020; 3rd line DARA c/b PD in 04/2020, then [place] Dex (last dose 10/20/21 due to infection and acute liver failure), with prolonged hospitalization since 12/02/21 (see below) for hypotension/shock in s/o norovirus and CMV viremia/colitis (both now s/p treatment and improved/resolved).     Course now c/b malnutrition requiring PEG tube placement (03/01/22), pt being unable to participate with PT/OT as much, and now 03/11/22 recurrent hypotension for which pt is being admitted to the ICU for additional evaluation.     OSH requesting transfer to [place] for continued care, per daughter's request. Reassuringly, though pt has not been on MM treatment since  10/2021, the SPEP/SFLC still remain stable/somewhat downtrended overall (last checked 02/23/22 as outlined above). No hypercalcemia or e/o kidney dysfunction. They have been able to have ID and GI consultation support there at [facility].     Unfortunately, due to high census capacity and lack of medical necessity (pt is able to get the appropriate consultants there at [facility], with appropriate medical management), we are unable to accept transfer at this time.       RECOMMENDATIONS:  - I did NOT accept this patient for transfer to [place] to the [facility]ology [facility].   - We will give update to OP [place] hematologist, Dr. Jodi Mourning team as an [place].  - Recommend they re-check CMV serologies and discuss with their ID, GI, and ICU consultants if any additional treatment needed.    Case reviewed with Malignant Hematology Attending, Dr. Chancy Milroy.    Maretta Los., MD  Fellow, Hematology / Oncology  Malignant Hematology Fellow Pager: [phone]  03/10/2022

## 2022-03-31 NOTE — Progress Notes (Signed)
Hematology, Blood & Marrow Transplant, and Cellular Therapy [facility]   Patient: Ariel Braun (MRN [id number])  Referring Facility: [facility]    SITUATION: Patient with suspected acute leukemia OR acute hematologic condition at an OSH.    BACKGROUND:    Ariel Braun is a 61 y.o. 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     Per Dr. Hervey Ard (03/11/22), feels like the diarrhea is improving with prn medications and s/p treatment for norovirus. Has remained on treatment with CMV viremia/colitis c/b mild leukopenia nadir WBC 0.95 (02/15/22) (WBC now improved), pt currently off valganciclovir x2 weeks after discussion with their inpatient ID. The larger issues have been surrounding nutrition as pt wasn't eating much, for which pt underwent PEG tube 03/01/22 with tube feeds. Since PEG tube placement, pt has been having episodes of hypotension requiring brief ICU stay, and currently being transferred back to ICU. States is very lethargic and responding only to basic stimuli. Pt's daughter requesting transfer to [place] per preference.     Of note, 02/23/22 SPEP with stable M-spike, SFLC decreased Kappa 44.2 / Lambda 5.5 = K/L ratio = 8.04 (overall downtrending still).     12/02/21-  12/09/21          [facility]: presented with hypovolemic shock 2/2 norovirus [name] (phenylephrine). MRCP performed with hepatic steatosis; RUQ US showed hepatomegaly with cholelithiasis (2 cm). HIDA scan performed that did not show e/o cystic duct obstruction or acute cholecystitis. Pt was subsequently transferred to [facility] for  further management of acute liver failure (AST 118, [name], Alp 958, TB 12.1).     12/09/21-  01/15/22          [place] Admission: for further evaluation of acute liver failure and shock. AM cortisol levels wnl so no e/o adrenal insufficiency. Pt was subsequently found to have e/o CMV colitis with CMV PCR (12/13/21) being elevated at 443,961 -> 179,555 (12/17/21) -> 74 (01/06/22). GI path panel (12/15/21) only positive for norovirus. LP performed 12/16/21 with 6% neutrophils, 88% lymphocytes, protein 42 nl, glucose 36 low, [name]; CMV CSF PCR neg. Pt underwent flex sig with GI (12/17/21) and biopsies showed rare small non-necrotizing mucosal granuloma and rare CMV immunolabeling (no active colitis); [name], but on repeat CMV stain there is more convincing labeling of rare epithelial cells and possibly endothelial cells and/or lymphocytes to raise consideration for a CMV contribution to diarrhea. [place], [name], [name], pt receive ganciclovir 5 mg/kg (01/31-02/21/23) and 1 dose of IVIg (12/22/21), the transitioned to valcyte 900 mg BID (02/21-02/23/23) -> valcyte 450 mg BID thereafter. Also s/p nitazoxanide x7d course (02/16-02/22/23) for norovirus treatment, with repeat GI viral PCR persistently positive for Norovirus (01/06/22; per ID likely from colonization).                              From a liver injury standpoint, RUQ [place] (12/11/21) showed hepatomegaly; cholelithiasis without  acute cholecystitis. 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 Ursodiol was increased to 600 mg BID per hepatology recommendations with expectation in  improvement over weeks to months. Pt did have associated acute toxic encephalopathy for which neurology was consulted. LP neg. EEG with diffuse slowing. Pt given empiric rifaximin given possibility of HE.     01/15/22-  Present            [place] Admission: pt was transferred back from [facility] for SNF placement. Pt developed a UTI for which was treated with course of CTX. Due to norovirus stool PCR being positive again, pt was re-treated with nitazoxanide x7d course. IV ganciclovir was also re-initiated empirically 01/27/22 given hx of CMV, but transitioned to valganciclovir 400 mg BID indefinitely. Pt began OTC immodium on 02/10/22. Pt was reportedly not working with physical therapy for SNF, re-evaluation pending and thus was needing higher level of care though family (daughter) without the financial resources to do so. Reportedly PEG tube placed 03/01/22 and initiated on tube feeds. Course further c/b hypotension requiring ICU level of care 03/03/22 - 03/09/22;     See prior note on previous transfer request.    Since that request, patient has remained on valcyte with most recent CMV titers 5/9 <200. Repeat testing of norovirus additionally positive 5/8. She has had continued anasarca with almost weekly paracentesis. Her course overall continues to be complicated by profound weakness, nutritional deficiency and failure to thrive. Her most recent labs demonstrate WBC ~2.4, Hg 8.6,Plt ~55. Alb 1.1, Bili 8 (~6 dir). This transfer request is initiated by her family, who have requested 2nd opinion Infectious disease consult for norovirus management and 2nd opinion GI consult with Dr. Wilson Singer for consideration of fecal transplant for persistent norovirus.    Myeloma hx:  IgG Kappa MM on 09/07/2010.  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   June 2020 due to  progression of M spike Revlimid was increased to 15 mg PO D1-21 every 28 days, plus dexamethasone  July 2021: started daratumumab, Pomylast 2 mg D 1-21, Q28 days, dexamethasone 12 mg PO Q weekly.   Primary oncologist: Dr. Iona Coach; Dr. Kennon Rounds [facility]  Receiving dexamethasone 12 mg weekly for diarrhea as outpt  Receiving MT with daratumumab with pomalyst and dex, most recent dose 10/16/21, has since been on hold due to worsening diarrhea.    DATA:   09/07/2010: IgG Kappa MM  12/02/2021: Ig M <5, IgG 447, IgA 20, Ig E <2, SFLC Kappa 16.6, Lambda 1.6, Kappa/Lamda 10.38  12/14/21: IgG K on IFE, IgG 430, KLC 16.6, LLC 2.7, KLR 6.15   02/23/22 SPEP with stable M-spike, SFLC decreased Kappa 44.2 / Lambda 5.5 = K/L ratio = 8.04 (overall downtrending still).     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 More importantly, she has had dramatic functional decline and poor nutritional intake 2/2 her prolonged hospitalization. Her transfer request has been initiated by her family for consideration of fecal transplant for norovirus and ID consultation. We discussed that unfortunately, our service is currently at capacity and we are unable to accept additional transfesr at this time. Moreover, although she has severe chronic illness,  she is not acutely decompensated and her current active medical issues are not hematologic. We discussed it is reasonable to re-initiate the transfer process at a later date when the census of the service has improved.      Case reviewed with [facility] Attending, Dr. Howard Pouch, MD  Fellow, Hematology / Oncology  [facility] Fellow Pager: [phone]  03/31/2022

## 2022-04-19 NOTE — Telephone Encounter (Signed)
I spoke at length regarding to [facility] daughter Ariel Braun.  Ariel Braun has been discharged since Friday(6/2) from [facility].   She is bedbound and incont at home. [name] is alert and oriented. She had been having frequent  Paracentesis in [facility].   Pt has a long standing history of Norovirus.  Ariel Braun, would like [name] to come to [place] to be evaluated. Ariel Braun said that the ambulance from [place] will not come to [place]. Ariel Braun has a paid caregiver that helps at night with her mom.  They would be unable to bring [name] by themselves in [place]ar.  She states that [name] is a full code and that they want everything done for [name] at this time.  I said I would follow up would follow up with her team. She said there is nothing urgent tonight -and is aware that she will need to go to [facility] if something should happen over night by calling 911.  This message is routed to her team.   It sounds as if maybe a video visit could be set up  With goals of care maybe further further support and medically what is next step.  - Ariel Braun is available 6/6.

## 2022-04-20 ENCOUNTER — Telehealth: Admit: 2022-04-22 | Payer: PRIVATE HEALTH INSURANCE | Attending: Nurse Practitioner

## 2022-04-20 DIAGNOSIS — C9000 Multiple myeloma not having achieved remission: Secondary | ICD-10-CM

## 2022-04-20 MED ORDER — MIDODRINE 10 MG TABLET
10 mg | Freq: Three times a day (TID) | ORAL | Status: DC
Start: 2022-04-20 — End: 2022-05-19

## 2022-04-20 MED ORDER — LACTOBAC 2-BIFIDO 1-S. THERM ORAL
ORAL | Status: DC
Start: 2022-04-20 — End: 2022-05-19

## 2022-04-20 MED ORDER — MULTIVITAMIN WITH FOLIC ACID 400 MCG TABLET
Freq: Every day | ORAL | Status: DC
Start: 2022-04-20 — End: 2022-05-19

## 2022-04-20 MED ORDER — LETERMOVIR 480 MG TABLET
480 mg | Freq: Every day | ORAL | Status: DC
Start: 2022-04-20 — End: 2022-05-19

## 2022-04-20 MED ORDER — THIAMINE HCL (VITAMIN B1) 100 MG TABLET
100 mg | Freq: Every day | ORAL | Status: DC
Start: 2022-04-20 — End: 2022-05-19

## 2022-04-20 MED ORDER — HYDROCODONE 5 MG-ACETAMINOPHEN 325 MG TABLET
5-325 | Freq: Four times a day (QID) | ORAL | 0.00 refills | 30.00000 days | Status: DC | PRN
Start: 2022-04-20 — End: 2022-05-19

## 2022-04-20 MED ORDER — FOLIC ACID 1 MG TABLET
1 | Freq: Every day | ORAL | 0.00 refills | 30.00000 days | Status: AC
Start: 2022-04-20 — End: 2022-05-19

## 2022-04-20 MED ORDER — MIRTAZAPINE 15 MG TABLET
15 | Freq: Every day | ORAL | 2.00 refills | 30.00000 days | Status: DC
Start: 2022-04-20 — End: 2022-05-19

## 2022-04-20 MED ORDER — ONDANSETRON HCL 8 MG TABLET
8 mg | Freq: Two times a day (BID) | ORAL | Status: DC
Start: 2022-04-20 — End: 2022-05-19

## 2022-04-20 MED ORDER — ZINC SULFATE 50 MG ZINC (220 MG) CAPSULE
220 | Freq: Every day | ORAL | 0.00 refills | 30.00000 days | Status: DC
Start: 2022-04-20 — End: 2022-05-19

## 2022-04-20 NOTE — Telephone Encounter (Signed)
OUTPATIENT HEM/BMT CLINIC SW NOTE:    Data:  Ariel Braun is a 61 year-old female patient being followed by Dr. Rory Percy and Trish Mage, NP at the outpatient Hematology/BMT Clinic for dx of myeloma.      SW received referral from Trish Mage, NP in reference to transportation support as pt is being urgently admitted.    Assessment:  SW reached pt's daughter, Ariel Braun by phone. Ariel Braun reports that she has a list of transportation resources for her mother that were provided to her by [employer]. Ariel Braun reports that pt is bedridden and has a PEG tube for feeding. Pt needs gurney transportation due to current physical limitations.     SW called two gurney transportation companies in the area and inquired about availability. Both companies report that they can provide transportation today from [place] to [employer]. SW provided the following information and contact details to pt's daughter, Ariel Braun by phone. SW called other companies but they were not available for same day service.    [employer]  [place]  [phone]    Pt's daughter reports that she is unable to bring pt to hospital today due to the timing and other logistical factors. Pt's daughter inquired about pt admitting to the hospital tomorrow.     SW relayed the request above to NP, Trish Mage and asked covering Practice RN to reach pt's daughter for further planning.    Plan:  SW provided pt with contact information.   SW remains available for support and advocacy.      Neila Gear, MSW  Clinical Social Worker  Hematology, BMT & Cellular Therapy [facility]) Program  Office: [phone]

## 2022-04-20 NOTE — Patient Instructions (Addendum)
Plan for ED or urgent admission for paracentesis, ID consult, GI consult    Follow up with new attending in 1 month

## 2022-04-20 NOTE — Progress Notes (Signed)
This is an independent service.   The available consultant for this service is [name] Carolynn Serve, MD.          Patient Name:  Ariel Braun    DOB:  Apr 08, 1961    MEDICAL RECORD NUMBER [id number]    Date of Visit: 04/20/2022     Today I saw Ariel Braun in the [facility], in follow up regarding myeloma. She has been referred internally by Dr. Eliberto Ivory. I have reviewed her medical records from [place] and will summarize them here for our records.    CHIEF COMPLAINT: Myeloma    SUMMARY OF HEMATOLOGIC HISTORY:  Ariel Braun is a 61 y.o. female who presents with myeloma. 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 M-spike 32, kappa 596.9 mg/L, IgG 4750, b2M 3.88. She was treated with VRd x4 (achieving VGPR), then MEL 200 ASCT 05/12/11, then observation alone. 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 She met criteria for PD on 02/19/2020 and was started on third-line DARA-Pd in June 2021.       INTERVAL HISTORY:     - Started DARA-Pd 04/2020 (pom 7m 21/28 days starting 05/14/20, dara starting 05/16/2020). Chemo last given 10/16/21, held since due to acute illness, which she continues to deal with currently.     - Psychosocial stress - her son [name](who is intellectually disabled and has hyperparathyroidism, severe kidney stone, brain tumor), and fears of COVID. Has been busy for her due to her son, [name] med issues. She is the sole caregiver to her husband (diabetes).    - Since 06/20/21, after getting covid vaccine, has been having  watery diarrhea episodically. With each vax she would have diarrhea, but usually stops after a few days.  With her last vaccine in August, the diarrhea persisted. Stool slightly formed while on dex, but returns to watery after a few days. Prior to august, no diarrhea with Pom. 10/04/21 stool studies + Norovirus.      [facility], [facility] then [name] and finally discharged home on 04/16/22.  Hospital events include, CMV colitis, CMV viremia, norovirus, septic shock, acute liver injury and subsequent encephalopathy, malnutrition, pressure ulcer.       Has home health RN, PT, OT.   PCP discharge follow up tomorrow.   CMV stable, on letermorvir.  Valcyte caused cytopenias.  norovirus still +. On imodium daily. Having about 2 loose stools a day.  Continues to have nausea, stomach cramps. Has a PEG tube.  [facility]    Requires para about twice weekly. [facility]23[facility] Currently distended but not severely. Interested in lasix.    Quite weak and deconditioned.  Able to turn in bed, but not able to lift up to sitting or bear weight.     Healed sacral bed sore.    ALLERGIES:        Allergies/Contraindications   Allergen Reactions    Diphenoxylate-Atropine Abdominal Pain     Severe stomach pain     Caffeine Anxiety     "Jitters"       MEDICATIONS:         Current Outpatient Medications   Medication Sig Dispense Refill    0.9% sodium chloride flush syringe Inject 3 mL  into the vein 2 (two) times daily as needed (line flush after each use)      acyclovir (ZOVIRAX) 400 mg tablet ON HOLD FOR NOW while on valganciclovir 180 tablet 3    albuterol (PROVENTIL) 2.5 mg /3 mL (0.083 %) inhalation solution Use 3 mL (2.5 mg total) by nebulization every 4 (four) hours as needed (wheezing/shortness of breath)      aluminum-magnesium hydroxide-simethicone (MAALOX PLUS) 200-200-20 mg/5 mL suspension Take 30 mL by mouth every 8 (eight) hours as needed for Indigestion (heartburn, bloating (1st))      cholecalciferol, vitamin D3, 1000 UNITS tablet Take 4 tablets (4,000 Units total) by mouth daily      clotrimazole (LOTRIMIN) 1 % cream Apply topically Twice a day Thighs and groin      dextrose 5 % and 0.9 % sodium chloride infusion Inject  75 mL/hr into the vein Continuous      dicyclomine (BENTYL) 10 mg capsule Take 2 capsules (20 mg total) by mouth in the morning and 2 capsules (20 mg total) at noon and 2 capsules (20 mg total) in the evening and 2 capsules (20 mg total) before bedtime.      dry mouth oral rinse (BIOTENE DRY MOUTH) solution 5 mL by Mucous Membrane route in the morning and 5 mL at noon and 5 mL in the evening and 5 mL before bedtime.      HYDROmorphone (DILAUDID) 0.5 mg/0.5 mL injection Inject 0.3-0.8 mL (0.3-0.8 mg total) into the vein every 4 (four) hours as needed (first line pain)  0    hydrOXYzine (ATARAX) 10 mg tablet Take 1 tablet (10 mg total) by mouth every 8 (eight) hours as needed for Anxiety      loperamide (IMODIUM A-D) 2 mg tablet Take 2 tablets (4 mg total) by mouth 4 (four) times daily as needed for Diarrhea      melatonin 3 mg tablet Take 2 tablets (6 mg total) by mouth nightly at bedtime      multivitamin complete chewable (FLINTSTONE'S COMPLETE) chewable tablet Chew 1 tablet by mouth daily      ondansetron (ZOFRAN) 4 mg/2 mL injection Inject 4 mL (8 mg total) into the vein every 8 (eight) hours as needed (nausea/vomiting)      rifAXIMin (XIFAXAN) 550 mg tablet Take 1 tablet (550 mg total) by mouth 2 (two) times daily      simethicone (MYLICON) 80 mg chewable tablet Chew 1 tablet (80 mg total) by mouth 2 (two) times daily as needed (Bloating alone)      thiamine (VITAMIN B1) 100 mg/mL injection Inject 2 mL (200 mg total) into the vein Once a day 25 mL     ursodiol (ACTIGALL) 60 mg/mL syrup Take 10 mL (600 mg total) by mouth 2 (two) times daily      valGANciclovir (VALCYTE) 450 mg tablet Take 1 tablet (450 mg total) by mouth 2 (two) times daily      vitamin E, dl,tocopheryl acet, (VITAMIN E, DL, ACETATE,) 45 mg (100 unit) capsule Take 8 capsules (800 Units total) by mouth daily       No current facility-administered medications for this visit.       PAST MEDICAL HISTORY:        Past Medical History:   Diagnosis Date    Acid reflux disease     Anxiety     Depression     Hereditary angioedema (CMS code)     Multiple myeloma, without mention of having achieved remission  IgG kappa    Myeloma (CMS code) 05/10/2011       PAST SURGICAL HISTORY:        Past Surgical History:   Procedure Laterality Date    AUTOLOGOUS STEM CELL TRANSPLANTATION  05/12/11    BREAST CYST EXCISION  1981   R breast surgery on 10/02/20 (path negative)    FAMILY HISTORY:         Family History   Problem Relation Name Age of Onset    Diabetes Mother      Hypertension Mother      Colon cancer Father      Hypertension Brother ##Brother1        SOCIAL HISTORY:  Social History          Socioeconomic History    Marital status: Married   Tobacco Use    Smoking status: Unknown   Substance and Sexual Activity    Sexual activity: Not Currently       PHYSICAL EXAM:  PHYSICAL EXAM VIA VIDEO VISIT:  ECOG Performance Status: 1 - Symptomatic but completely ambulatory   On VV, patient is laying in bed and speaking slowly, difficulty with word finding. Cannot complete sentences.   Vital Signs: No vitals  General:  In NAD, but appears weak and confused  Respiratory:  Normal breathing, No respiratory distress        LABORATORY DATA:          sPEP Interpretation   Date Value Ref Range Status   12/14/2021 Abnormal (A) Normal pattern. Final     Comment:     Hypoalbuminemia and hypogammaglobulinemia with a protein spike in the gamma region, consistent with a monoclonal protein, possibly daratumumab. Paraprotein spike = 0.2 g/dL.  Interpretation by Pathologist: Clelia Schaumann, M.D.             M-Protein (monoclonal), Serum   Date Value Ref Range Status   12/14/2021 0.2 (A) Negative g/dL Final           M-Protein/sPEP - External   Date Value Ref Range Status   08/04/2020 0.8  Final             Immunofixation Electrophoresis, serum   Date Value Ref Range Status   12/14/2021 IgG Kappa (A)  Negative Final     Comment:     Paraprotein present, IgG Kappa. The IgG Kappa band is migrating in a region where daratumumab is known to localize on immunofixation.  If this patient is receiving daratumumab treatment, it is possible that the IgG Kappa band represents daratumumab   rather than an endogenous monoclonal protein.  Interpretation by Pathologist: Clelia Schaumann, M.D.             Lab Results   Component Value Date    IgA, Serum 20 (L) 10/13/2021    IgA - External 12 08/04/2020    IgG 585 (L) 10/13/2021    IgG, serum 745 01/01/2022    IgG - External 989 08/04/2020    IgM, Serum 7 (L) 10/13/2021    IgM - External 14 08/04/2020    Kappa (Serum, Free) - External 70.6 08/04/2020    Kappa Light Chain, Serum, Free 16.6 12/14/2021    Lambda (Serum, Free) - External 3.9 08/04/2020    Lambda Light Chain, Serum, Free 2.7 (L) 12/14/2021    Kappa/Lambda Ratio, Serum, Free 6.15 (H) 12/14/2021          PATHOLOGY:  As noted above.    RADIOLOGY:  As noted  above.    ASSESSMENT &PLAN  The patient is a 61 y.o. female who presents for evaluation of myeloma.    # IgG kappa MM: Prior lines of therapy include (1) VRd --> MEL ASCT --> observation, (2) Rev --> Rd, and now (3) DARA-Pom/dex started 04/2020 for M-spike rising to 1.1. Now down to 0.8 as of 07/07/2020, although some of this may be DARA interference of course. Had brief increase in [facility] around time of breast surgery 09/2020 (when surgery was on hold), but then resumed thereafter. 02/2021 SPEP M protein and SFLC downtrending and 08/18/2021 MM labs stable   - Patient has history of allergic reactions and HAE (was monitored 1-3 hrs after first doses, got Singulair pre-med initially) but no issues so far with DARA.   - Continue to HOLD DARA-Pd due to ongoing Norovirus and persistent diarrhea and poor performance status  DARA once a month (last had this 1st week of Dec)  Pom 64m daily, 21 of 28 days (HOLD)  Dex once weekly, 21 of 28 days (HOLD)- we  talked about stopping the dex but the pt wants to continue for now as it helps with the diarrhea  - no recent labs  - Continue monthly MM labs  - next line will consider CFZ/seli/dex but will need to discuss if seli AEs would be ok with her     #FTT: patient's performance status had been declining for 2-3 months prior to ED admission. Today 04/20/22, patient recently discharge home, laying is hospital bed.  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  01/13/22 CMV 176 international units/ml prior to discharge from [place]. Inpatient, treated with Ganciclovir IV, IVIG x1, and then discharged from [place] on Valcyte 4535mPO BID.  - 04/03/22 CMV PCR <200 (wnl) [facility]- Per patient's daughter, valcyte switched to letermovir due to count suppression with valcyte  - unclear if she still needs ppx at this point  - ID consult on admission  - check CMV PCR on admission and monitor weekly    #Norovirus: Patient first noted diarrhea after covid vaccinations. Not helped with prevalite, but imodium helped. After last covid vaccine 06/2021, she has had increase diarrhea. 08/27/21 CDiff and O+P negative, but + for norovirus.  [facility] for persistent diarrhea, FTT, malnutrition and new encelphalopathy.  Stool study last completed at [facility]/24/23, showing persistent Norovirus.  - continues on supportive care  - nutrition via PEG  - GI consult upon admission  - patient wants to discuss with [facility] re: fecal transplant due to persistent Norovirus     #Liver failure:  New AMS/encephalopathy noted during 12/02/21 VV.  Patient advised to go to ED.   T[facility] by [facility] 12[facility]21 and admitted for an extended period of time. On admission, T bili 5.4, Alk phos 1314, AST/ALT 152/130.  At worse T bili 12.1, alk phos 958, ast/alt 118/150 on 12/09/21. Hepatology consulted at [place], liver bx showed steatohepatitis with pericentral/sinusoidal and periportal fibrosis; possibly drug related (Pom?).  LFTs much improved. On 04/15/22, prior [facility] 0.[facility],  alk phos 230, ast/alt 72/34.  - on ursodiol  - continue to monitor    #Ascites: has been requiring paracentesis twice weekly.  Last had para on 04/13/22 prior to discharge.  Outpatient para scheduled for 04/27/22 locally, but she is starting to feel distended and uncomfortable.  - ED for para    # Anxiety, emotional distress: Causes of stress include her role as sole caregiver to her son (intellectually disabled, severe hyperparathyroidism), husband (diabetes), and herself. Also stressed about the myeloma itself and its prognosis, and also about COVID.   - follow up SMS    - Can consider oncopsychology referral down the line    # R breast lesion: Noted on 12/2019 PET-CT, then confirmed on 04/2020 R breast US. She got the breast biopsy 07/08/2020 showed papillary lesion, recommending excisional biopsy which returned as benign, per patient   - follow up Dr. Iona Coach (local oncologist) and Dr. Maudie Mercury (surgeon)    # Hereditary angioedema:   - Follows with allergist locally   - No issues with DARA-Pd    # History of PEs: Occurred while on Revlimid.   - Continue low-dose Eliquis indefinitely, particularly while on Pom    #Bone health  - if she did not finish 2 years of zometa, then would recommend finishing a total of 2 years AFTER she finishes the dental work she needs currently    [facility]  - follow up with PCP re: mammo, colonoscopy, vaccines    #Ppx:  - On letermovir for CMV  - s/p [place] #1 02/26/20, #2 03/18/20, #3 08/14/20, #4 02/02/21, #5 06/20/2021  - recommend bivalent covid booster  - recommend yearly flu  vaccine  - 11/06/20 covid ab neg  - repeat covid Ab 03/03/21 was neg   - s/p evusheld 04/01/21    #Follow-up:  TBD discharge; 1 month with new attending      Thank you for involving me in the care of your patient. Please do not hesitate to call me anytime to discuss this patient.    Kathrynn Running NP      Patient Care Team:  Eldridge Scot, DO as PCP - General (Family Medicine)  Trellis Paganini, MD (Hematology and Oncology)  Generic Provider Mychart  Sima Matas, MD (Palliative Care Medicine)  Dr. Herbert Moors (Surgeon)          This patient has a high complexity problem:multiple myeloma, which is an incurable malignancy and poses a threat to life and bodily function.     Regarding data complexity,I have reviewed the following tests: CBC, CMP, SPEP, light chains    Medical decision making: high risk. Advised continue holding Dara/Pom/Dex  for multiple myeloma due to recent infections, poor performance status, FTT. The patient will continue on this therapy until progression or intolerance and therefore must be closely monitored for toxicities, which may include low blood counts, infections, clots, secondary malignancies, GI toxicities, peripheal neuropathy, fatigue. The patient was extensively counseled on this and agrees to continue with the treatment plan.       I performed this evaluation using real-time telehealth tools, including a live video Zoom connection between my location and the patient's location. Prior to initiating, the patient consented to perform this evaluation using telehealth tools.

## 2022-04-22 DIAGNOSIS — C9000 Multiple myeloma not having achieved remission: Secondary | ICD-10-CM

## 2022-04-22 DIAGNOSIS — C889 Malignant immunoproliferative disease, unspecified: Secondary | ICD-10-CM

## 2022-04-22 DIAGNOSIS — K7200 Acute and subacute hepatic failure without coma: Secondary | ICD-10-CM

## 2022-04-22 NOTE — ED Provider Notes (Signed)
EMERGENCY DEPARTMENT PHYSICIAN NOTE        ED Attending(s):     Chief Complaint   Patient presents with    Failure To Thrive     Per referral note: FTT, ascites, abdominal pain, persistent diarrhea, medication management. BIBA from home for further workup per Dr. Kennon Rounds with hematology.        HISTORY       Interpreter used? (Optional): No      Ariel Braun is a 61 y.o. old female, with w/ IgG Kappa MM with prolonged hospitalization since 12/02/21 (see below) for hypotension/shock in s/o norovirus and CMV viremia/colitis (both now treated) referred for Braun for FTT and progression of MM  Recent Braun to [facility] 01/15/22-04/16/22 for norovirus (chronic)  Since discharge has had persistent diarrhea 5-6 episodes per day nonbloody  Increased abdominal distension  No abdominal pain  No chest pain or cough  No fevers  PEG tube dependent  No vomiting  Referred for admit for CRI service for heme eval for progressing myeloma      Per chart review:  "01/15/22-  6/2/23[name]: Patient had severe bone marrow suppression in response to ganciclovir and after 2 attempts (had 2 recurrances of CMV) was then transitioned to letermovir. 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 Patient has required multiple paracentesis due to ascites/malnutrition/liver insufficiency. [facility] rejected transfer on 5/18. Patient does not cooperate with care, refuses PT and will not take p.o. despite being able to so, peg/TF dependent. Has poor blood counts, malnutrition, has coccyx/sacral decub. Lengthy conversation with the patient's daughter on 04/13/2022 discussing the patient's extremely poor prognosis, multiple MD's including oncology and infectious disease and myself agree that the patient is unlikely to survive/recover and we recommend hospice. Dr. Caren Griffins  also had lengthy conversation with her. In the meantime the patient is stable does not require any further treatment in house and is unable to go to SNF due to above-mentioned infections. She will be discharged to home with home health and referral to home palliative care. Fever, UCx Klebsiella S LQ- Got 2 days Cefepime, DC with LQ to complete 5 D"        Allergies/Contraindications   Allergen Reactions    Diphenoxylate-Atropine Abdominal Pain     Severe stomach pain     Caffeine Anxiety     "Jitters"         Medical History    Past Medical History   Diagnosis Date    Acid reflux disease     Anxiety     Depression     Hereditary angioedema (CMS code)     Multiple myeloma, without mention of having achieved remission     IgG kappa    Myeloma (CMS code) 05/10/2011              Surgical History    Past Surgical History:   Procedure Laterality Date    AUTOLOGOUS STEM CELL TRANSPLANTATION  05/12/11    BREAST CYST EXCISION  1981              Family History    Family History   Problem Relation Name Age of Onset    Diabetes Mother      Hypertension Mother      Colon cancer Father      Hypertension Brother ##Brother1  Social History     Socioeconomic History    Marital status: Married   Tobacco Use    Smoking status: Unknown   Substance and Sexual Activity    Sexual activity: Not Currently           PHYSICAL EXAM       Triage Vital Signs:  BP: 116/74, Heart Rate: 86, Pulse - Palpated/Pleth: 86, Temp: 37.2 C (99 F), *Resp: 18, SpO2: 100 %    Physical Exam  Constitutional:       General: She is not in acute distress.     Appearance: She is ill-appearing. She is not toxic-appearing or diaphoretic.      Comments: Thin, ill appearing   HENT:      Head: Normocephalic and atraumatic.      Right Ear: Ear canal and external ear normal.      Left Ear: Ear canal and external ear normal.      Nose: Nose normal.      Mouth/Throat:      Mouth: Mucous membranes are dry.   Eyes:      General: No scleral icterus.      Extraocular Movements: Extraocular movements intact.      Conjunctiva/sclera: Conjunctivae normal.      Pupils: Pupils are equal, round, and reactive to light.   Cardiovascular:      Rate and Rhythm: Normal rate and regular rhythm.      Pulses: Normal pulses.      Heart sounds: Normal heart sounds.   Pulmonary:      Effort: Pulmonary effort is normal.      Breath sounds: Normal breath sounds.   Abdominal:      Comments: Distended  Fluid wave  Soft  nontender  PEG tube in place, clean and dry  Two superficial areas of skin breakdown each approx 3x2cm, covered with clean bandages   Musculoskeletal:         General: No swelling, tenderness, deformity or signs of injury. Normal range of motion.      Cervical back: Normal range of motion. Rigidity present.      Right lower leg: No edema.      Left lower leg: No edema.   Skin:     Comments: Diffuse bruising over entire body   Neurological:      General: No focal deficit present.      Mental Status: She is oriented to person, place, and time.   Psychiatric:      Comments: tearful         Medical Decision Making               Medical Decision Making  Concern for progression of MM   Consider infection but afebrile  Consider SBP, pt with nontender abdomen with clear ascites. On initial eval patient declines paracentesis  Consider electrolyte abnormality given persistent diarrhea     Plan: labs, symptom control prn, offer paracentesis therapeutic here, admit CRI    Multiple myeloma and immunoproliferative neoplasms (CMS code): chronic illness or injury  Norovirus: chronic illness or injury  Amount and/or Complexity of Data Reviewed  Labs: ordered.      Risk  Decision regarding hospitalization.                  Final Disposition and ED Course             admit to CRI  ED Course as of 04/23/22 0019   Adriana Simas [name] Documentation   Thu Apr 22, 2022   [id number] Paged CRI for admit   Fri Apr 23, 2022   0018 CRI to admit        [name],  MD  Resident  04/22/22 1941       Jose Persia, MD  04/24/22 [phone]

## 2022-04-22 NOTE — H&P (Incomplete)
[facility] H&P NOTE - ATTENDING ONLY     My date of service is 04/23/2022.    Treatment Team     Not on file          Primary [place]  [place] [address]  [phone]    Family/Surrogate Contact Info    APEX    Chief Complaint    Diarrhea, abdominal pain, worsening ascites, Failure to thrive.     History of Present Illness    [name] is a 61 year old female with pmhx of IgG Kappa MM ( sp VRd X4, achieving VGPR; Mel 200 Auto HCT 05/12/11, c/b PD 03/2014 started on single agent revlimid 2nd line c/b PD 04/21; 3rd line DARA c/b PD in 6/21, then [place] POM Dex (last dose on 10/20/21) due to infection and acute liver failure, with prolonged hospitalization since 12/02/21 for hypotension, shock, norovirus and CMV viremia/colitis, PEG tube dependence, presents to the ED on 04/22/22 for failure to thrive and progression of MM. She states that she has been having about 5-6 non bloody bowel movements per day. Patient follows with Dr. Boyd Kerbs, was last seen in the clinic on 04/22/22, patient was referred to the ED for further evaluation. The patient has had paracentesis twice weekly, last para on 5/30.     While in the ED,   Patient was afebrile, Tmax 99, HR 80-90's, Rr 15-18's, SBP 100-116's satting normally on RA.   Labs pending  Diagnostic paracentesis was performed. Patient declined therapeutic para.     ROS: ******    Patient was seen by Dr. Hervey Ard 03/11/22, feels like diarrhea is improving with PRN medications sp treatment for norovirus. Has remained on treatment for CMV viremia/colitis c/b mild leukopenia nadir WBC .95, with WBC improved off valganciclovir X2 weeks, after long discussion with inpatient ID. Due to lack of PO intake, she had PEG tube placed 03/01/22 with tube feeds started. She has had brief episodes of hypotension with IUC stays. Patient has been lethargic, only responding to basic stimuli. Per family request, patient was  transferred to [facility].     Of note, 02/23/22 SPEP with stable M-spike, SFLC decreased Kappa 44.2 / Lambda 5.5 = K/L ratio = 8.04 (overall downtrending still).    [facility] from 12/02/21-12/09/21: patient presented with hypovolemic shock 2/2 norovirus, [name], [name], RUQ US showed HPM with cholelithiasis, HIDA scan with no evidence of cystic duct obstruction or acute cholecystitis. Patient was transferred to [facility] for further management of acute liver failure with AST 118, [name], ALP 958 and TB 12.1.     [facility] admission from 1/25-01/15/22: acute liver failure and shock, no evidence of adrenal insufficiency. 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 GI path panel was positive for norovirus.     LP performed 12/16/21 with 6% neutrophils, 88% lymphocytes, protein 42 nl, glucose 36 low, [name]; CMV CSF PCR neg. Pt underwent flex sig with GI (12/17/21) and biopsies showed rare small non-necrotizing mucosal granuloma and rare CMV immunolabeling (no active colitis); [name], but on repeat CMV stain there is more convincing labeling of rare epithelial cells and possibly endothelial cells and/or lymphocytes to raise consideration for a CMV contribution to diarrhea. Giemsa, [name], and GMS stains negative for organisms. Adenovirus immunostain  negative. No e/o plasma cell neoplasm. For the CMV, pt receive ganciclovir 5 mg/kg (01/31-02/21/23) and 1 dose of IVIg (12/22/21), [name], with repeat GI viral PCR persistently positive for Norovirus (01/06/22; [name], RUQ [place] (12/11/21) showed  hepatomegaly; cholelithiasis without acute cholecystitis. 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 Ursodiol was increased to 600 mg BID per hepatology recommendations with expectation in improvement over weeks to months. Pt did have associated acute toxic encephalopathy for which neurology was consulted. LP neg. EEG with diffuse slowing. Pt given empiric rifaximin given possibility of HE.     01/15/22-  [facility] Admission: pt was transferred back from [facility] for SNF placement. Pt developed a UTI for which was treated with course of CTX. Due to norovirus stool PCR being positive again, pt was re-treated with nitazoxanide x7d course. IV ganciclovir was also re-initiated empirically 01/27/22 given hx of CMV, but transitioned to valganciclovir 400 mg BID indefinitely. Pt began OTC immodium on 02/10/22. Pt was reportedly not working with physical therapy for SNF, re-evaluation pending and thus was needing higher level of care though family (daughter) without the financial resources to do so. Reportedly PEG tube placed 03/01/22 and initiated on tube feeds. Course further c/b hypotension requiring ICU level of care 03/03/22 - 03/09/22;     See prior note on previous transfer request.    Since that request, patient has remained on valcyte with most recent CMV titers 5/9 <200. Repeat testing of norovirus additionally positive 5/8. She has had continued anasarca with almost weekly paracentesis. Her course overall continues to be complicated by profound weakness, nutritional deficiency and failure to thrive. Her most recent labs demonstrate WBC ~2.4, Hg 8.6,Plt ~55. Alb 1.1, Bili 8 (~6 dir). This transfer request is initiated by her family, who have requested 2nd opinion Infectious disease consult  for norovirus management and 2nd opinion GI consult with Dr. Wilson Singer for consideration of fecal transplant for persistent norovirus.    *************    Per chart review:  "01/15/22-  6/2/23[name] Admission:Patient had severe bone marrow suppression in response to ganciclovir and after 2 attempts (had 2 recurrances of CMV) was then transitioned to letermovir. 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 Patient has required multiple paracentesis due to ascites/malnutrition/liver insufficiency.     [place] rejected transfer on 5/18. Patient does not cooperate with care, refuses PT and will not take p.o. despite being able to so, peg/TF dependent. Has poor blood counts, malnutrition, has coccyx/sacral decub. Lengthy conversation with the patient's daughter on 04/13/2022 discussing the patient's extremely poor prognosis, multiple MD's including oncology and infectious disease and myself agree that the patient is unlikely to survive/recover and we recommend hospice. Dr. Caren Griffins also had lengthy conversation with her. In the meantime the patient is stable does not require any further treatment in house and is unable to go to SNF due to above-mentioned infections. She will be discharged to home with home health and referral to home palliative care. Fever, UCx Klebsiella S LQ- Got 2 days Cefepime, DC with LQ  to complete 5 D"    Patient was [facility] back [facility], was last discharged on Friday 04/16/22.   Since  Being at home with daughter, mentation has improved, has had improved PO intake since previously. Has had improved energy, has increased upper extremity strength.   Patient has had the PEG tube for over 3-4 weeks, which has helped her energy levels. Has not been receiving any abx at home, for UTI, course ended on Monday 6/5, UA/UCx on  Wednesday of last week Wednesday, patient was febrile. UCx showed a drug resistant strain and patient was discharged home on PO abx,     Patient had an appointment with Trish Mage, PA,   Patient was placed on valcyte for CMV at [facility], with leukopenia,  Patient started Letermovir 480 mg PO one time one time daily. Approximately a few week ago for leukopenias. Patient does not have her home dose with her, patient ran out of her home supply.     Patient has been having 2-3 loose stools per day, non bloody, no abdominal pain, uses peptobismal and zofran, sometimes uses norco for stomach cramps. Denies any fever, chills, has nausea ( intermittent) worse with AM meds, takes zofran prior to taking her medications. Has been having weight loss, 40 pounds in the last 6 months, no epistaxis, no chest pain or shortness of breath. Loose stools vary 2-3 per day to 3-4 per day. Patient is sensitive to lactose containing foods.   Patient presented to ED for HLOC, with GI, ID, Hepatology consultation to medically optimize her prior to restarting treatment for MM.     Oncologic History  Myeloma hx:  IgG Kappa MM on 09/07/2010.  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   June 2020 due to progression of M spike Revlimid was increased to 15 mg PO D1-21 every 28 days, plus dexamethasone  July 2021: started daratumumab, Pomylast 2 mg D 1-21, Q28 days, dexamethasone 12 mg PO Q weekly.   Primary oncologist: Dr. Iona Coach; Dr. Kennon Rounds [place] [facility]  Receiving dexamethasone 12 mg weekly for diarrhea as outpt  Receiving MT with daratumumab with pomalyst and dex, most recent dose 10/16/21, has since been on hold due to worsening diarrhea.    DATA:   09/07/2010: IgG Kappa MM  12/02/2021: Ig M <5, IgG 447, IgA 20, Ig E <2, SFLC Kappa 16.6, Lambda 1.6, Kappa/Lamda 10.38  12/14/21: IgG K on IFE, IgG 430, KLC  16.6, LLC 2.7, KLR 6.15   02/23/22 SPEP with stable M-spike, SFLC decreased Kappa 44.2 / Lambda 5.5 = K/L ratio = 8.04 (overall downtrending still).    Past Medical History:   Diagnosis Date    Acid reflux disease     Anxiety     Depression     Hereditary angioedema (CMS code)     Multiple myeloma, without mention of having achieved remission     IgG kappa    Myeloma (CMS code) 05/10/2011     Past Surgical History:   Procedure Laterality Date    AUTOLOGOUS STEM CELL TRANSPLANTATION  05/12/11    BREAST CYST EXCISION  1981       Immunization History   Administered Date(s) Administered    DT 01/21/2012, 07/06/2012    DTaP 07/06/2012    Diphtheria 01/21/2012, 07/06/2012    Hep B, Unspecified 08/28/2012    Hep B,Adult 01/21/2012, 08/28/2012  HiB PRP-OMP 01/21/2012, 07/14/2012    HiB PRP-T 01/21/2012, 02/12/2012    Hib, Unspecified 01/21/2012, 07/14/2012    IPV 01/21/2012, 07/24/2012    Influenza 09/30/2015, 09/23/2017, 11/12/2018    Influenza Inj Prsv Free 11/12/2018    Influenza Quad MDCK, Pfree 10/01/2016    Influenza Seasonal Inj 10/13/2009, 08/19/2012, 09/23/2017    Influenza Split 09/14/2013, 10/25/2014, 09/23/2017    Influenza, Unspecified 10/13/2009, 08/19/2012    Influenza-CC 09/28/2019    Pneumococcal Conjugate (Prevnar 13) 01/21/2012, 06/26/2012    Pneumococcal Polysaccharide (PPSV 23) 04/12/2017    Td 01/21/2012, 07/06/2012    Tdap 10/15/2005       This patient is immunocompromised and will not mount an adequate antibody response so will not recieve the pneumococcal or influenza vaccination during this admission. They will be screened and vaccinated in clinic when appropriate.    Allergies: Diphenoxylate-atropine and Caffeine     (Not in a hospital admission)      Social History     Socioeconomic History    Marital status: Married   Tobacco Use    Smoking status: Unknown   Substance and Sexual Activity    Sexual activity: Not Currently       *** Family History has not been  Marked As Reviewed in the last 24 hours. Please review it in the   "History" navigator and refresh this [place]. Alternatively you can free text your own Family History here.        Review of Systems   Constitutional: Negative.    HENT: Negative.    Eyes: Negative.    Respiratory: Negative.    Cardiovascular: Negative.    Gastrointestinal: Positive for diarrhea.   Genitourinary: Negative.    Musculoskeletal: Negative.    Skin: Negative.    Neurological: Negative.    Endo/Heme/Allergies: Negative.    Psychiatric/Behavioral: Negative.      {Please use SmartBlock above to document ROS then select from this list; other wording such as "see HPI" is not acceptable:[id number]}    Vitals  Temp:  [37.2 C (99 F)-37.4 C (99.3 F)] 37.4 C (99.3 F)  Heart Rate:  [86-93] 93  *Resp:  [15-18] 15  BP: (101-116)/(70-74) 101/70  SpO2:  [98 %-100 %] 98 %    No intake or output data in the 24 hours ending 04/23/22 0037    Pain Score:      Wt Readings from Last 2 Encounters:   01/15/22 96 kg (211 lb 10.3 oz)   12/12/18 96.3 kg (212 lb 4.9 oz)       KPS  {100 to 0:24767}    Physical Exam    Data    CBC  No results found in last 72 hours    Coags  No results found in last 72 hours    Chem7  No results found in last 72 hours    Electrolytes  No results found in last 72 hours    Liver Panel  No results found in last 72 hours    Amylase/Lipase  No results found in last 72 hours    Lactate  No results found in last 72 hours    Blood Gas  No results found in last 72 hours    BNP  No results found in last 72 hours    Troponin  No results found in last 72 hours      Lipid Panel  No results found in last 72 hours    Hemoglobin A1c  No results found in last  72 hours    TSH  No results found in last 72 hours    HIV  No results found in last 30 days     CRP  No results found in last 30 days  {Infectious Disease Labs:20248}    {Micro Results:27611}    Radiology Results  No results found.  {I discussed the findings with the  radiologist:3040101}    {All Reviewed?:304247681}    {I have personally reviewed and interpreted the followingstudies:304247701}    {IPOUTSIDERECORDSREVIEW_IP_CD_[facility]:30424510}    {I spoke with ... from ... regarding ...:16073710}    {The team contacted primary care doctor (or other physician):304250111}    {Consult Requested:[id number]}    Problem-based Assessment & Plan    #MM  IgG Kappa MM on 09/07/2010.  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   June 2020 due to progression of M spike Revlimid was increased to 15 mg PO D1-21 every 28 days, plus dexamethasone  July 2021: started daratumumab, Pomylast 2 mg D 1-21, Q28 days, dexamethasone 12 mg PO Q weekly.   Primary oncologist: Dr. Iona Coach; Dr. Kennon Rounds [facility]  Receiving dexamethasone 12 mg weekly for diarrhea as outpt  Receiving MT with daratumumab with pomalyst and dex, most recent dose 10/16/21, has since been on hold due to worsening diarrhea.    DATA:   09/07/2010: IgG Kappa MM  12/02/2021: Ig M <5, IgG 447, IgA 20, Ig E <2, SFLC Kappa 16.6, Lambda 1.6, Kappa/Lamda 10.38  12/14/21: IgG K on IFE, IgG 430, KLC 16.6, LLC 2.7, KLR 6.15   02/23/22 SPEP with stable M-spike, SFLC decreased Kappa 44.2 / Lambda 5.5 = K/L ratio = 8.04 (overall downtrending still).    Plan:   Follow up on MM lVA: IGg K on IFE, KLC, LLC, KLR  TBD, currently off therapy due to recurrent infections    # Diarrhea  # Hx of CMV Viremia and norovirus  > GI path positive for norovirus,   Flex sig with CMV   Sp ganiciclovir, IV IG, valcyte 900 BID, Ganciclovir, and valcyte,   Sp nitazoxanide X7 day course  X2 for treatment of norovirus.   -Order repeat C diff studies, GI Pathogen PCR  -Order repeat CMV titers  -ID consult and GI consult, discussion regarding possible fecal transplantation for noro virus. Was previously in communication with [facility] Dr. Wilson Singer ( for fecal transplant)  -If C diff negative, start immodium     # Worsening abdominal pain and anasarca in the setting of underlying liver disease   # Hx of pericentral/sinusioidal and periportal fibrosis, ductal reaction with focal pericholangitis from vanishing bile duct in the setting of pomalidomide.   -diagnostic paracentesis pending  -Patient had been requiring weekly paracentesis.   -Blood cultures X 2,  -Start IV Ceftriaxone, as empirical therapy for SBP  -receiving ursodiol 600 mg PO BID,   -Hepatology following.     # Failure to thrive  # Decreased PO intake with PEG tube placement        No new Assessment & Plan notes have been filed under this hospital service since the last note was generated.  Service: [facility]      VTE PPx:  {HEME BMT VTE PPX:30400238}      Severity of Illness  {Treating pain with IV opiates?:304247711}    The patient has the following conditions and comorbidities:  {BMT Conditions:29614}  Code Status: Prior    Carmine Savoy, MD  04/23/2022

## 2022-04-22 NOTE — Progress Notes (Signed)
[facility] ED REFERRALS     Please call the ED Clerk at [phone] as this is necessary to initiate the ED Expected encounter for your patient.    If you are sending the patient for admission, does the patient have an emergent issue preventing direct admission? If not please call Bed Control for a direct admission [phone].      Reason for sending the patient to the ED today - any brief relevant past history and the acute cause or need: FTT, ascites, abdominal pain, persistent diarrhea, medication management    Specific tests or actions you are requesting be considered today in the ED:  - paracentesis (requires twice weekly paracentesis; last para on 5/30 prior to discharge, next scheduled for 6/16 outpatient, but patient is getting distended and uncomfortable)  - persistent diarrhea secondary to Norovirus  - myeloma progressing; needs heme eval    Please admit to C[facility] service under Dr. Melanee Left.    Patient Code Status: unknown    Do you request a call back during the ED visit: no    If so do you need to speak to the Attending: no

## 2022-04-23 ENCOUNTER — Inpatient Hospital Stay: Admit: 2022-04-23 | Payer: PRIVATE HEALTH INSURANCE

## 2022-04-23 ENCOUNTER — Inpatient Hospital Stay: Admit: 2022-04-23 | Discharge: 2022-05-27 | Disposition: A | Discharge status: Skilled Nursing Facility | Payer: 59

## 2022-04-23 LAB — COMPLETE BLOOD COUNT WITH DIFFERENTIAL
% NRBC: 1 /100 WBC (ref 0–1)
Abs Basophils: 0 10*9/L (ref 0.00–0.10)
Abs Basophils: 0.02 10*9/L (ref 0.00–0.10)
Abs Eosinophils: 0 10*9/L (ref 0.00–0.40)
Abs Eosinophils: 0.02 10*9/L (ref 0.00–0.40)
Abs Imm Granulocytes: 0.07 10*9/L (ref <0.10)
Abs Imm Granulocytes: 0.05 10*9/L (ref <0.10)
Abs Lymphocytes: 0.54 10*9/L — ABNORMAL LOW (ref 1.00–3.40)
Abs Lymphocytes: 0.46 10*9/L — ABNORMAL LOW (ref 1.00–3.40)
Abs Monocytes: 0.34 10*9/L (ref 0.20–0.80)
Abs Monocytes: 0.39 10*9/L (ref 0.20–0.80)
Abs Neutrophils: 2.45 10*9/L (ref 1.80–6.80)
Abs Neutrophils: 2.46 10*9/L (ref 1.80–6.80)
Elliptocytes: 2 (ref 0–1)
Hematocrit: 27.1 % — ABNORMAL LOW (ref 36.0–46.0)
Hematocrit: 25.4 % — ABNORMAL LOW (ref 36.0–46.0)
Hemoglobin: 8.5 g/dL — ABNORMAL LOW (ref 12.0–15.5)
Hemoglobin: 8.2 g/dL — ABNORMAL LOW (ref 12.0–15.5)
MCH: 34.6 pg — ABNORMAL HIGH (ref 26.0–34.0)
MCH: 34.5 pg — ABNORMAL HIGH (ref 26.0–34.0)
MCHC: 31.4 g/dL (ref 31.0–36.0)
MCHC: 32.3 g/dL (ref 31.0–36.0)
MCV: 110 fL — ABNORMAL HIGH (ref 80–100)
MCV: 107 fL — ABNORMAL HIGH (ref 80–100)
MPV: 13.1 fL — ABNORMAL HIGH (ref 9.1–12.6)
MPV: 12.7 fL — ABNORMAL HIGH (ref 9.1–12.6)
Platelet Count: 77 10*9/L — ABNORMAL LOW (ref 140–450)
Platelet Count: 74 10*9/L — ABNORMAL LOW (ref 140–450)
Polychromasia: 2 (ref 0–1)
RBC Count: 2.46 10*12/L — ABNORMAL LOW (ref 4.00–5.20)
RBC Count: 2.38 10*12/L — ABNORMAL LOW (ref 4.00–5.20)
RDW-CV: 18.2 % — ABNORMAL HIGH (ref 11.7–14.4)
RDW-CV: 18.6 % — ABNORMAL HIGH (ref 11.7–14.4)
WBC Count: 3.4 10*9/L (ref 3.4–10.0)
WBC Count: 3.4 10*9/L (ref 3.4–10.0)

## 2022-04-23 LAB — CELL COUNT AND  DIFFERENTIAL, BODY FLUID
Conc Smear,BFL; # Cells: 100
Lymphs, BFL: 26 %
Mono,Histio,Mesothel: 73 %
Neuts, BFL: 1 %
RBCs, BFL: 0.147 10*9/L
Total Nucleated Cells, Body Fluid: 0.024 10*9/L

## 2022-04-23 LAB — COMPREHENSIVE METABOLIC PANEL (BMP, AST, ALT, T.BILI, ALKP, TP ALB)
AST: 90 U/L — ABNORMAL HIGH (ref 5–44)
Alanine transaminase: 30 U/L (ref 10–61)
Albumin, Serum / Plasma: 1.9 g/dL — ABNORMAL LOW (ref 3.4–4.8)
Alkaline Phosphatase: 183 U/L — ABNORMAL HIGH (ref 38–108)
Anion Gap: 6 (ref 4–14)
Bilirubin, Total: 1.1 mg/dL (ref 0.2–1.2)
Calcium, total, Serum / Plasma: 8.1 mg/dL — ABNORMAL LOW (ref 8.4–10.5)
Carbon Dioxide, Total: 20 mmol/L — ABNORMAL LOW (ref 22–29)
Chloride, Serum / Plasma: 109 mmol/L (ref 101–110)
Creatinine: 0.53 mg/dL — ABNORMAL LOW (ref 0.55–1.02)
Glucose, non-fasting: 77 mg/dL (ref 70–199)
Potassium, Serum / Plasma: 4.4 mmol/L (ref 3.5–5.0)
Protein, Total, Serum / Plasma: 4.8 g/dL — ABNORMAL LOW (ref 6.3–8.6)
Sodium, Serum / Plasma: 135 mmol/L (ref 135–145)
Urea Nitrogen, Serum / Plasma: 36 mg/dL — ABNORMAL HIGH (ref 7–25)
eGFRcr: 105 mL/min/{1.73_m2} (ref >59)

## 2022-04-23 LAB — VENOUS BLOOD GAS W/LACTATE
Base excess: NEGATIVE mmol/L
Bicarbonate: 22 mmol/L — ABNORMAL LOW (ref 23–29)
Calcium, Ionized, whole blood: 1.17 mmol/L (ref 1.13–1.27)
Chloride, whole blood: 109 mmol/L — ABNORMAL HIGH (ref 99–108)
Glucose, whole blood: 74 mg/dL (ref 70–199)
Hematocrit from Hb: 26 % — ABNORMAL LOW (ref 36–46)
Hemoglobin, Whole Blood: 8.6 g/dL — ABNORMAL LOW (ref 11.8–15.6)
Lactate, whole blood: 1 mmol/L (ref 0.5–2.0)
Oxygen Saturation: 95 % (ref 95–100)
PCO2: 31 mm Hg — ABNORMAL LOW (ref 32–46)
PO2: 67 mm Hg — ABNORMAL LOW (ref 83–108)
Potassium, Whole Blood: 4.2 mmol/L (ref 3.4–4.5)
Sodium, whole blood: 133 mmol/L — ABNORMAL LOW (ref 136–146)
pH, Blood: 7.46 — ABNORMAL HIGH (ref 7.35–7.45)

## 2022-04-23 LAB — KAPPA AND LAMBDA FREE LIGHT CHAINS, SERUM
Kappa Light Chain, Serum, Free: 70.7 mg/L — ABNORMAL HIGH (ref 3.3–19.4)
Kappa/Lambda Ratio, Serum, Free: 3.57 — ABNORMAL HIGH (ref 0.26–1.65)
Lambda Light Chain, Serum, Free: 19.8 mg/L (ref 5.7–26.3)

## 2022-04-23 LAB — BASIC METABOLIC PANEL (NA, K, CL, CO2, BUN, CR, GLU, CA)
Anion Gap: 4 (ref 4–14)
Calcium, total, Serum / Plasma: 8.1 mg/dL — ABNORMAL LOW (ref 8.4–10.5)
Carbon Dioxide, Total: 21 mmol/L — ABNORMAL LOW (ref 22–29)
Chloride, Serum / Plasma: 109 mmol/L (ref 101–110)
Creatinine: 0.53 mg/dL — ABNORMAL LOW (ref 0.55–1.02)
Glucose, non-fasting: 77 mg/dL (ref 70–199)
Potassium, Serum / Plasma: 4.3 mmol/L (ref 3.5–5.0)
Sodium, Serum / Plasma: 134 mmol/L — ABNORMAL LOW (ref 135–145)
Urea Nitrogen, Serum / Plasma: 36 mg/dL — ABNORMAL HIGH (ref 7–25)
eGFRcr: 105 mL/min/{1.73_m2} (ref >59)

## 2022-04-23 LAB — CLOSTRIDIUM DIFFICILE
Clostridium difficile: NEGATIVE
Clostridium difficile: NOT DETECTED
Comments: NEGATIVE

## 2022-04-23 LAB — IGM, SERUM: IgM, serum: 47 mg/dL (ref 39–333)

## 2022-04-23 LAB — ALANINE TRANSAMINASE: Alanine transaminase: 28 U/L (ref 10–61)

## 2022-04-23 LAB — PROTHROMBIN TIME
INR: 1.4 — ABNORMAL HIGH (ref 0.9–1.2)
PT: 16.7 s — ABNORMAL HIGH (ref 11.6–15.0)

## 2022-04-23 LAB — ASPARTATE TRANSAMINASE: AST: 88 U/L — ABNORMAL HIGH (ref 5–44)

## 2022-04-23 LAB — LACTATE DEHYDROGENASE, BODY FLUID: LDH, Fluid: 65 U/L

## 2022-04-23 LAB — COVID-19 RNA, RT-PCR/NUCLEIC ACID AMPLIFICATION: COVID-19 RNA, RT-PCR/Nucleic Acid Amplification: NOT DETECTED

## 2022-04-23 LAB — PROTEIN, TOTAL, BODY FLUID: Protein, Total, Body Fluid: 1.3 g/dL

## 2022-04-23 LAB — GLUCOSE, BODY FLUID: Glucose, Body fluid: 92 mg/dL

## 2022-04-23 LAB — ALKALINE PHOSPHATASE: Alkaline Phosphatase: 167 U/L — ABNORMAL HIGH (ref 38–108)

## 2022-04-23 LAB — BILIRUBIN, TOTAL: Bilirubin, Total: 0.9 mg/dL (ref 0.2–1.2)

## 2022-04-23 LAB — DRAW AND HOLD RAINBOW TUBES - 4 ML GOLD TOP (ED USE ONLY)

## 2022-04-23 LAB — CYTOMEGALOVIRUS DNA, QUANTITATIVE PCR, PLASMA
CMV DNA (IU/mL): 105 IU/mL — ABNORMAL HIGH (ref <30)
CMV DNA (Log IU/mL): 2.02 — ABNORMAL HIGH (ref <1.48)

## 2022-04-23 LAB — PHOSPHORUS, SERUM / PLASMA: Phosphorus, Serum / Plasma: 3.7 mg/dL (ref 2.3–4.7)

## 2022-04-23 LAB — MAGNESIUM, SERUM / PLASMA: Magnesium, Serum / Plasma: 1.8 mg/dL (ref 1.6–2.6)

## 2022-04-23 LAB — GAMMA-GLUTAMYL TRANSPEPTIDASE: Gamma-Glutamyl Transpeptidase: 135 U/L — ABNORMAL HIGH (ref 8–59)

## 2022-04-23 LAB — LIPASE: Lipase: 13 U/L (ref 8–78)

## 2022-04-23 LAB — IGG, SERUM: IgG, serum: 997 mg/dL (ref 672–1760)

## 2022-04-23 LAB — AMYLASE, BODY FLUID: Amylase, Body Fluid: 11 U/L

## 2022-04-23 LAB — LACTATE DEHYDROGENASE, BLOOD: LDH, ser/pl: 235 U/L (ref 125–243)

## 2022-04-23 MED ORDER — MIRTAZAPINE 15 MG TABLET
15 | Freq: Every day | ORAL | Status: DC
Start: 2022-04-23 — End: 2022-04-24
  Administered 2022-04-23: 16:00:00 via ORAL

## 2022-04-23 MED ORDER — RIFAXIMIN 550 MG TABLET
550 mg | Freq: Two times a day (BID) | ORAL | Status: DC
Start: 2022-04-23 — End: 2022-04-25
  Administered 2022-04-23 – 2022-04-24 (×2): 550 mg via ORAL
  Administered 2022-04-24
  Administered 2022-04-25: 18:00:00 via ORAL
  Administered 2022-04-25: 04:00:00 550 mg via ORAL

## 2022-04-23 MED ORDER — DIPHENOXYLATE-ATROPINE 2.5 MG-0.025 MG TABLET
Freq: Four times a day (QID) | ORAL | Status: DC | PRN
Start: 2022-04-23 — End: 2022-04-23

## 2022-04-23 MED ORDER — LOPERAMIDE 2 MG TABLET
2 | Freq: Four times a day (QID) | ORAL | Status: DC | PRN
Start: 2022-04-23 — End: 2022-04-26
  Administered 2022-04-23: 22:00:00 via ORAL
  Administered 2022-04-24
  Administered 2022-04-26: 18:00:00 4 mg via ORAL

## 2022-04-23 MED ORDER — SODIUM CHLORIDE 0.9 % (FLUSH) INJECTION SYRINGE
0.9 | Freq: Two times a day (BID) | INTRAMUSCULAR | Status: DC
Start: 2022-04-23 — End: 2022-05-27
  Administered 2022-04-24
  Administered 2022-04-24 – 2022-04-26 (×4): 3 mL via INTRAVENOUS
  Administered 2022-04-26 – 2022-04-27 (×2): via INTRAVENOUS
  Administered 2022-04-27: 17:00:00 3 mL via INTRAVENOUS
  Administered 2022-04-28 – 2022-04-30 (×4): via INTRAVENOUS
  Administered 2022-05-01 – 2022-05-02 (×2): 3 mL via INTRAVENOUS
  Administered 2022-05-02: 03:00:00 via INTRAVENOUS
  Administered 2022-05-03: 16:00:00 3 mL via INTRAVENOUS
  Administered 2022-05-03: 04:00:00 via INTRAVENOUS
  Administered 2022-05-04 – 2022-05-06 (×5): 3 mL via INTRAVENOUS
  Administered 2022-05-06: 04:00:00 via INTRAVENOUS
  Administered 2022-05-08 – 2022-05-13 (×11): 3 mL via INTRAVENOUS
  Administered 2022-05-14 – 2022-05-15 (×3): via INTRAVENOUS
  Administered 2022-05-15: 17:00:00 3 mL via INTRAVENOUS
  Administered 2022-05-16 – 2022-05-17 (×3): via INTRAVENOUS
  Administered 2022-05-17: 05:00:00 3 mL via INTRAVENOUS
  Administered 2022-05-18 – 2022-05-22 (×9): via INTRAVENOUS
  Administered 2022-05-23: 19:00:00 3 mL via INTRAVENOUS
  Administered 2022-05-24 (×2): via INTRAVENOUS
  Administered 2022-05-25: 06:00:00 3 mL via INTRAVENOUS
  Administered 2022-05-25 – 2022-05-27 (×5): via INTRAVENOUS

## 2022-04-23 MED ORDER — ONDANSETRON HCL 4 MG TABLET
4 mg | Freq: Four times a day (QID) | ORAL | Status: DC | PRN
Start: 2022-04-23 — End: 2022-04-26
  Administered 2022-04-24

## 2022-04-23 MED ORDER — CHOLECALCIFEROL (VITAMIN D3) 25 MCG (1,000 UNIT) TABLET
1000 | Freq: Every day | ORAL | Status: DC
Start: 2022-04-23 — End: 2022-04-25
  Administered 2022-04-23: 22:00:00 2000 [IU] via ORAL
  Administered 2022-04-24
  Administered 2022-04-24: 17:00:00 2000 [IU] via ORAL

## 2022-04-23 MED ORDER — SODIUM CHLORIDE 0.9 % (FLUSH) INJECTION SYRINGE
0.9 | INTRAMUSCULAR | Status: DC | PRN
Start: 2022-04-23 — End: 2022-05-27
  Administered 2022-04-24

## 2022-04-23 MED ORDER — URSODIOL 60 MG/ML ORAL SYRUP
60 | Freq: Two times a day (BID) | ORAL | Status: DC
Start: 2022-04-23 — End: 2022-04-25
  Administered 2022-04-23: 18:00:00 600 mg via ORAL
  Administered 2022-04-24
  Administered 2022-04-24 – 2022-04-25 (×3): 600 mg via ORAL

## 2022-04-23 MED ORDER — LIDOCAINE (PF) 10 MG/ML (1 %) INJECTION SOLUTION
10 mg/mL (1 %) | INTRAMUSCULAR | Status: DC
  Administered 2022-04-23: 20:00:00 via SUBCUTANEOUS
  Administered 2022-04-24

## 2022-04-23 MED ORDER — ACETAMINOPHEN 325 MG TABLET
325 mg | Freq: Three times a day (TID) | ORAL | Status: DC | PRN
Start: 2022-04-23 — End: 2022-04-30
  Administered 2022-04-24
  Administered 2022-04-29 – 2022-04-30 (×3): 650 mg via ORAL

## 2022-04-23 MED ORDER — LANSOPRAZOLE 30 MG CAPSULE,DELAYED RELEASE
30 | Freq: Every morning | ORAL | Status: DC
Start: 2022-04-23 — End: 2022-04-23

## 2022-04-23 MED ORDER — THIAMINE MONONITRATE (VITAMIN B1) 100 MG TABLET
100 | Freq: Every day | ORAL | Status: DC
Start: 2022-04-23 — End: 2022-04-25
  Administered 2022-04-23: 16:00:00 100 mg via ORAL
  Administered 2022-04-24
  Administered 2022-04-24: 17:00:00 100 mg via ORAL

## 2022-04-23 MED ORDER — HYDROCODONE 5 MG-ACETAMINOPHEN 325 MG TABLET
5-325 mg | Freq: Four times a day (QID) | ORAL | Status: DC | PRN
Start: 2022-04-23 — End: 2022-04-30
  Administered 2022-04-24
  Administered 2022-04-24 – 2022-04-30 (×8): 1 via ORAL

## 2022-04-23 MED ORDER — CEFTRIAXONE 2 GRAM SOLUTION FOR INJECTION
2 gram | Freq: Every day | INTRAMUSCULAR | Status: DC
Start: 2022-04-23 — End: 2022-04-23
  Administered 2022-04-23: 11:00:00 2 g via INTRAVENOUS

## 2022-04-23 MED ORDER — MULTIVITAMIN WITH FOLIC ACID 400 MCG TABLET
Freq: Every day | ORAL | Status: DC
Start: 2022-04-23 — End: 2022-04-25
  Administered 2022-04-23: 16:00:00 1 via ORAL
  Administered 2022-04-24
  Administered 2022-04-24: 17:00:00 via ORAL

## 2022-04-23 MED ORDER — ELECTROLYTE-A INTRAVENOUS SOLUTION
INTRAVENOUS | Status: DC
Start: 2022-04-23 — End: 2022-04-23

## 2022-04-23 MED ORDER — ZINC SULFATE 50 MG ZINC (220 MG) CAPSULE
220 (50) mg | Freq: Every day | ORAL | Status: DC
Start: 2022-04-23 — End: 2022-04-25
  Administered 2022-04-23 – 2022-04-24 (×2): 220 mg via ORAL
  Administered 2022-04-24
  Administered 2022-04-25: 18:00:00 via ORAL

## 2022-04-23 MED ORDER — LETERMOVIR 480 MG TABLET
480 mg | Freq: Every day | ORAL | Status: DC
Start: 2022-04-23 — End: 2022-04-25
  Administered 2022-04-23: 16:00:00 via ORAL
  Administered 2022-04-24
  Administered 2022-04-25: 04:00:00 via ORAL

## 2022-04-23 MED ORDER — SENNOSIDES 8.6 MG TABLET
8.6 mg | Freq: Every day | ORAL | Status: DC
Start: 2022-04-23 — End: 2022-04-25
  Administered 2022-04-24
  Administered 2022-04-25: 04:00:00 via ORAL

## 2022-04-23 MED ORDER — ONDANSETRON HCL (PF) 4 MG/2 ML INJECTION SOLUTION
4 | Freq: Four times a day (QID) | INTRAMUSCULAR | Status: DC | PRN
Start: 2022-04-23 — End: 2022-04-26
  Administered 2022-04-23 (×2): 4 mg via INTRAVENOUS
  Administered 2022-04-24
  Administered 2022-04-25 – 2022-04-26 (×3): 4 mg via INTRAVENOUS
  Administered 2022-04-26: 18:00:00 via INTRAVENOUS

## 2022-04-23 MED ORDER — FOLIC ACID 1 MG TABLET
1 mg | Freq: Every day | ORAL | Status: DC
Start: 2022-04-23 — End: 2022-04-30
  Administered 2022-04-23 – 2022-04-24 (×2): via ORAL
  Administered 2022-04-24
  Administered 2022-04-25 – 2022-04-28 (×3): via ORAL
  Administered 2022-04-29: 18:00:00 1 mg via ORAL

## 2022-04-23 MED FILL — XIFAXAN 550 MG TABLET: 550 mg | ORAL | Qty: 1

## 2022-04-23 MED FILL — ONDANSETRON HCL (PF) 4 MG/2 ML INJECTION SOLUTION: 4 mg/2 mL | INTRAMUSCULAR | Qty: 2

## 2022-04-23 MED FILL — MIRTAZAPINE 15 MG TABLET: 15 mg | ORAL | Qty: 1

## 2022-04-23 MED FILL — THIAMINE MONONITRATE (VITAMIN B1) 100 MG TABLET: 100 mg | ORAL | Qty: 1

## 2022-04-23 MED FILL — URSODIOL (BULK) 100 % POWDER: 100 % | Qty: 0.6

## 2022-04-23 MED FILL — ANTI-DIARRHEAL (LOPERAMIDE) 2 MG TABLET: 2 mg | ORAL | Qty: 2

## 2022-04-23 MED FILL — ZINC SULFATE 50 MG ZINC (220 MG) CAPSULE: 220 (50) mg | ORAL | Qty: 1

## 2022-04-23 MED FILL — PREVYMIS 480 MG TABLET: 480 mg | ORAL | Qty: 1

## 2022-04-23 MED FILL — TAB-A-VITE 400 MCG TABLET: 400 mcg | ORAL | Qty: 1

## 2022-04-23 MED FILL — FOLIC ACID 1 MG TABLET: 1 mg | ORAL | Qty: 1

## 2022-04-23 MED FILL — LIDOCAINE (PF) 10 MG/ML (1 %) INJECTION SOLUTION: 10 mg/mL (1 %) | INTRAMUSCULAR | Qty: 10

## 2022-04-23 MED FILL — CHOLECALCIFEROL (VITAMIN D3) 25 MCG (1,000 UNIT) TABLET: 1000 UNITS | ORAL | Qty: 2

## 2022-04-23 MED FILL — CEFTRIAXONE 2 GRAM SOLUTION FOR INJECTION: 2 gram | INTRAMUSCULAR | Qty: 2000

## 2022-04-23 NOTE — Consults (Addendum)
NUTRITION SERVICES ASSESSMENT    Patient History:    [name] is a 61 year old female with pmhx of IgG Kappa MM ( sp VRd X4, achieving VGPR; Mel 200 Auto HCT 05/12/11, c/b PD 03/2014 started on single agent revlimid 2nd line c/b PD 04/21; 3rd line DARA c/b PD in 6/21, then Dara POM Dex (last dose on 10/20/21) due to infection and acute liver failure, with prolonged hospitalization since 12/02/21 for hypotension, shock, norovirus and CMV viremia/colitis, PEG tube dependence, presents to the ED on 04/22/22 for failure to thrive and progression of MM. She states that she has been having about 5-6 non bloody bowel movements per day. Patient follows with Dr. Boyd Kerbs, was last seen in the clinic on 04/22/22, patient was referred to the ED for further evaluation. The patient has had paracentesis twice weekly, last para on 5/30.     PEG tube placed 04/17    Pertinent interval history:  Per H&P (06/09):   Endorses to 3-4 loose stools per day, decreased PO intake, weakness, intermittent confusion, also reporting GERD and nausea which has been responding to pepto bismul and Zofran at home. Patient reports 40 pound weight loss over the last 6 months, but denies any other localizing symptoms dyspnea, dysuria, fever or chills. Patient was discharged from [facility] on 04/16/22. Per daughter since discharge, she has had improvement in energy levels and appetite. Sometimes tolerating PO in addition to Tube feedings.   Of note, patient does not cooperate with care, refuses PT and will not take p.o. despite being able to so, peg/TF dependent.     In-House Nutrition Orders:  Dysphagia Diet Level 3 (Liquidized); Level 3 (Moderately/Honey Thick)     Tube Feeding-Adult (Continuous): Nutren 2.0; Nutren 1.0 Fiber   Order Comments:Start full strength @ 10 mL/hr  Advance 10 mL/hr every 8 hours as tolerated to goal rate.  Check residuals every 8 hours and hold if equal to or greater than 500 mL.    If PEG-J tube present, please use the J-port to  feed the patient.    Anthropometric Data and Assessment:  Height:   153 cm (5' 0.24")  Ideal Body Weight: 45.99 kg    Usual body weight:90.91 kg (per previous admissions)   Admit weight: No admit weight charted    Calculation Weight: 96 kg (Percent IBW (calculated) (%): 209)  Calc weight BMI: 41 kg/m^2  Alternative Weight (kg): 61.9 kg    Wt Readings from Last 10 Encounters:  01/15/22 96 kg (211 lb 10.3 oz)    01/13/22  95.1 kg (209 lb 10.5 oz) Bed scale   01/11/22  93 kg (205 lb 0.4 oz)    01/09/22  90.1 kg (198 lb 10.2 oz) Bed scale   01/07/22  89.5 kg (197 lb 5 oz) Bed scale   01/06/22  90.3 kg (199 lb 1.2 oz) Bed scale   01/04/22  88.5 kg (195 lb 1.7 oz) Bed scale   01/03/22  92.2 kg (203 lb 4.2 oz) Bed scale   12/29/21  85.6 kg (188 lb 11.4 oz) Bed scale   12/27/21  85 kg (187 lb 6.3 oz) Bed scale   12/25/21  89.8 kg (197 lb 15.6 oz) Bed scale   12/24/21  90.7 kg (199 lb 15.3 oz) Bed scale   12/22/21  90.9 kg (200 lb 6.4 oz)    12/21/21  91.4 kg (201 lb 8 oz) Bed scale   12/20/21  90.6 kg (199 lb 11.8 oz)  12/17/21  89.8 kg (197 lb 15.6 oz) Bed scale   12/16/21  88.1 kg (194 lb 3.6 oz) Bed scale   12/15/21  90.2 kg (198 lb 13.7 oz) Bed scale   12/14/21  88 kg (194 lb 0.1 oz)    12/13/21  88 kg (194 lb 0.1 oz)    12/12/21  81 kg (178 lb 9.2 oz)    12/11/21  83 kg (182 lb 15.7 oz)    12/09/21  79.6 kg (175 lb 7.8 oz) Admit Weight   06/25/21 90.7 kg (200 lb) CE; Verbal Weight   12/12/20 96.3 kg (212 lb 4.9 oz)      Assessment of weight change: Severe weight loss  No admit weight recorded. Patient reports 40 pound weight loss over the last 6 months. Charted weights likely influenced by fluid shifts given pt's history of edema. Last admission (01/13/22), it was found that pt had a 11 kg (12%) weight in the last 6-7 months, c/w severe weight loss. Will CTM and update calc weight.    Nutrition-focused physical findings:   Visual Assessment: Shows visual signs of malnutrition    Physical Findings (Exam Date  06/09)  Extremities: Lower extremity edema  Muscles  Temples: Severe Depletion: Hollowing, scooping, depression  Clavicle: Severe Depletion: Protruding, prominent bone  Shoulders: Severe Depletion: Shoulder to arm joint looks square. Bones prominent. Acromion protrusion very prominent.  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 Some nausea with TFs.  Mouth: Dry mouth   GI: Appetite, decrease, Nausea, Diarrhea     Lines, Drains, Airway, Wounds:   Patient Lines/Drains/Airways Status     Active LDAs     Name Placement date Placement time Site Days    Peripheral IV 04/23/22 Left;Posterior Hand 04/23/22  0258  Hand  less than 1    Wound 12/19/21 Skin tear Sacrum 12/19/21  1900  Sacrum  124    Feeding Tube Left lower quadrant --  --  LLQ  --              Food and Nutrition Intake:   Information obtained from: Family, Patient    Enteral/Parenteral Nutrition Intake  Enteral Nutrition Intake Details: Home TF Regimen: Nutren 1.5 @ 50 mL/hr continuous. Daughter states that it took time for patient to tolerate full TF volume, but was tolerating PTA. Used to be on Osmolite 1.5, but had to switch given out of stock. TF regimen provides 1200 mL, 1800 kcal, 82 g protein.     Oral Food and Fluid Intake  Meal/Snack Pattern: Grazing    Diet Recall Details: Per pt's daughter, pt grazes on foods during the day (bites of food, juice).  Vitamin/mineral and Herbal Supplements/CAM intake details: MVI, zinc, probiotic     Intake Data  Assessment of energy/protein intake:   Adequate protein/energy intake (greater than or equal to 75% estimated needs) (from TFs per daughter's report)  Barriers to adequate nutrient intake: Altered mental status, Poor appetite, Nutrient/texture restriction, Unable to tolerate PO intake       Significant Labs:   Biochemical:  Lab  Results   Component Value Date    Sodium, Serum / Plasma 134 (L) 04/23/2022    Potassium, Serum / Plasma 4.3 04/23/2022    Chloride, Serum / Plasma 109 04/23/2022    Carbon Dioxide, Total 21 (L) 04/23/2022  Urea Nitrogen, Serum / Plasma 36 (H) 04/23/2022    Creatinine 0.53 (L) 04/23/2022    eGFRcr 105 04/23/2022    Glucose 101 (H) 05/10/2019    Glucose, non-fasting 77 04/23/2022    Calcium, total, Serum / Plasma 8.1 (L) 04/23/2022    Calcium, Ionized, whole blood 1.17 04/23/2022    Magnesium, Serum / Plasma 1.8 04/23/2022    Phosphorus, Serum / Plasma 3.7 04/23/2022     Lab Results   Component Value Date    Alanine transaminase 28 04/23/2022    AST 88 (H) 04/23/2022    Alkaline Phosphatase 167 (H) 04/23/2022    Bilirubin, Direct 6.2 (H) 01/15/2022    Bilirubin, Total 0.9 04/23/2022    Gamma-Glutamyl Transpeptidase 135 (H) 04/23/2022    Triglycerides, serum 259 (H) 12/20/2021    PT 16.7 (H) 04/23/2022    Ammonia 26 12/15/2021     Lab Results   Component Value Date    Lactate, whole blood 1.0 04/23/2022     Lab Results   Component Value Date    Hemoglobin 8.2 (L) 04/23/2022    MCV 107 (H) 04/23/2022    Abs Neutrophils 2.46 04/23/2022     Lab Results   Component Value Date    Glucose, Glucometer 89 12/30/2021    Glucose, Glucometer 99 12/29/2021    Glucose, Glucometer 105 12/29/2021    Glucose, Glucometer 84 12/28/2021    Glucose, Glucometer 88 12/28/2021    Glucose, Glucometer 93 12/27/2021    Glucose, Glucometer 81 12/27/2021    Glucose, Glucometer 102 12/26/2021    Glucose, Glucometer 86 12/26/2021    Glucose, Glucometer 88 12/25/2021     Micronutrient Profile:  Lab Results   Component Value Date    Vitamin D, 25-Hydroxy 6 (L) 12/23/2021    Ferritin, Serum/Plasma 948 (H) 12/20/2021    Iron, serum 60 12/11/2021    Transferrin, Serum/Plasma 46 (L) 12/11/2021    % Saturation 93 (H) 12/11/2021    Vitamin B12 871 (H) 12/15/2021    Folate, serum 3.9 (L) 12/15/2021     Inflammatory profile:   Lab Results   Component  Value Date    C-Reactive Protein 11.3 (H) 12/13/2021    Albumin, Serum / Plasma 1.9 (L) 04/23/2022    WBC Count 3.4 04/23/2022     Stool studies:  Lab Results   Component Value Date    Clostridium difficile Toxin gene: Not detected 04/23/2022    Clostridium difficile Comment: Negative test 04/23/2022       Significant Meds:   Scheduled Meds:   0.9% sodium chloride flush  3 mL Intravenous [id number] [place]    folic acid  1 mg Oral Daily [place]    letermovir  480 mg Oral Daily [place]    lidocaine (PF)  10 mL Subcutaneous Once    mirtazapine  15 mg Oral Daily [place]    multivitamin  1 tablet Oral Daily [place]    rifAXIMin  550 mg Oral BID SCH    senna  17.2 mg Oral Daily At Bedtime [facility]    thiamine mononitrate (vit B1)  100 mg Oral Daily [place]    ursodiol  600 mg Oral BID [place]    zinc sulfate  220 mg Oral Daily [place]     Continuous Infusions:  PRN Meds:.0.9% sodium chloride flush, acetaminophen, HYDROcodone-acetaminophen, ondansetron **OR** ondansetron    Comparative Standards:    Energy Needs: MSJ: [place] (based on 1445.25: x 1.2 - 1.3)   Protein Needs: 80.47 g -  86.66 g (1.3 - 1.4 g/kg based on 61.9 kg)   Fluid needs: [place]: 2128.5 mL/d     Nutrition diagnosis:   Inadequate oral intake related to poor appetite as evidenced by reliance on EN supplementation to meet nutrient needs. New.      Malnutrition Summary:  Severe Chronic disease or condition related malnutrition related to AMS, poor PO tolerance as evidenced by weight loss of >10% in 6 months, severe loss of muscle mass, severe loss of body fat .  Present on admission.    Nutrition Intervention:  Oral Nutrition:    Inpatient Orders: Advance diet per SLP recommendations     Enteral Nutrition:   Enteral nutrition plan: Start enteral nutrition  --> Initiate Nutren 1.5 @ 10 mL/hr.  --> Advance by 20 mL/hr q 8 hrs as tolerated to goal rate of 50 mL/hr.  Provides 1200 mL, 1800 kcal, 82 g protein, 8.8 gm CHO/hr, 100% RDI for vitamins/minerals.    Tube Feed  Monitoring:  --> Weigh patient twice weekly  --> Monitor serum electrolytes, Cr, BUN, Mg, Phos, Ca/iCa daily (until stable)     Vitamins/Minerals  Recommend vitamin supplement therapy, Recommend mineral supplement therapy, Recommend micronutrient lab check    Specific vitamin recommendations: Vitamin D, Multivitamin, Thiamin, Folate   --> Continue MVI, folic acid and Thiamine as ordered  --> Vitamin D (cholecalciferol, 2000 units, daily) via FT.    Specific mineral supplements therapy: Zinc   --> Continue zinc as ordered    Recommend micronutrient lab check : Vitamin D 25-OH, Folate, Homocysteine  --> Check 25-(OH) Vitamin D with next lab draw to assess for deficiency.    Nutrition Monitoring/Evaluation:  Nutrition Goal Indicator: Energy intake  Nutrition Goal Criteria: TF to meet >75% estimated energy needs  Nutrition Goal Progress: New goal identified  Nutrition Goal 2 Indicator: Macronutrient intake  Nutrition Goal 2 Criteria: TF to meet >75% estimated protein needs  Nutrition Goal 2 Progress: New goal identified    Sonny Dandy, [place], [place]  [place] [phone]

## 2022-04-23 NOTE — H&P (Signed)
[facility] H[facility]PITALIST H&P NOTE - ATTENDING ONLY     My date of service is 04/23/2022.    Treatment Team     Provider Service Role Specialty From To Pager    Rounding Team Heme/Bmt/Cell Therapy A1 -- Primary Team  -- 04/23/22 0231 --   [place], MD [facility]ology Attending Provider Hematology 04/23/22 0231 --         Ariel Ores, MD -- Resident  [place] 04/23/22 0059 --   [phone]          Primary CarePhysician:     A[place]  [place] [address]  [phone]    Family/Surrogate Contact Info  APEX    Chief Complaint  Diarrhea, abdominal pain, worsening ascites, Failure to thrive.     History of Present Illness  History was obtained from daughter and chart review.     [name] is a 61 year old female with pmhx of IgG Kappa MM ( sp VRd X4, achieving VGPR; Mel 200 Auto HCT 05/12/11, c/b PD 03/2014 started on single agent revlimid 2nd line c/b PD 04/21; 3rd line DARA c/b PD in 6/21, then Oval POM Dex (last dose on 10/20/21) due to infection and acute liver failure, with prolonged hospitalization since 12/02/21 for hypotension, shock, norovirus and CMV viremia/colitis, PEG tube dependence, presents to the ED on 04/22/22 for failure to thrive and progression of MM. She states that she has been having about 5-6 non bloody bowel movements per day. Patient follows with Dr. Boyd Braun, was last seen in the clinic on 04/22/22, patient was referred to the ED for further evaluation. The patient has had paracentesis twice weekly, last para on 5/30.     While in the ED,   Patient was afebrile, Tmax 99, HR 80-90's, Rr 15-18's, SBP 100-116's satting normally on RA.   Labs pending  Diagnostic paracentesis was performed. Patient declined therapeutic para.     ROS: Endorses to 3-4 loose stools per day, decreased PO intake, weakness, intermittent confusion, also reporting GERD and nausea which has been responding to pepto bismul and Zofran at home. Patient  reports 40 pound weight loss over the last 6 months, but denies any other localizing symptoms dyspnea, dysuria, fever or chills. Patient was discharged from [facility] on 04/16/22. Per daughter since discharge, she has had improvement in energy levels and appetite. Sometimes tolerating PO in addition to Tube feedings. Diarrhea has been persistent and she reports about 3-4 loose stools per day, being treated with PO letermovir 480 mg (rather than valcyte due to leukopenia), for CMV colitis. Also receiving mirtazipine 15 mg PO one time daily, and norco PRN for abdominal pain. She had an appointment with Ariel Braun today in the clinic and she advised that the patient present to [place] for failure to thrive, concerns for SBP and unresolving diarrhea. Patient prefers to be evaluated at [facility] for subspecialty care.     Relevant History:   [facility] admission from 12/02/21-12/09/21: patient presented with hypovolemic shock 2/2 norovirus, [name], [name], RUQ US showed HPM with cholelithiasis, [name], [name], ALP 958 and TB 12.1.     - admission from 1/25-01/15/22: acute liver failure and shock, no evidence of adrenal insufficiency.  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 GI path panel was positive for norovirus. LP performed 12/16/21 with 6% neutrophils, 88% lymphocytes, protein 42 nl, glucose 36 low, [name]; CMV CSF PCR neg. Pt underwent flex sig with GI (12/17/21) and biopsies showed rare small non-necrotizing mucosal granuloma and rare CMV immunolabeling (no active colitis); [name], [name], [name], [name], pt receive ganciclovir 5 mg/kg (01/31-02/21/23) and 1 dose of IVIg (12/22/21), [name], with repeat GI viral PCR persistently positive for Norovirus (01/06/22; [name], RUQ [place] (12/11/21) showed hepatomegaly; cholelithiasis without acute cholecystitis. 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 Ursodiol was increased to 600 mg BID per hepatology recommendations with expectation in improvement over weeks to months. Pt did have associated acute toxic encephalopathy for which neurology was consulted. LP neg. EEG with diffuse slowing. Pt given empiric rifaximin given possibility of HE.     01/15/22-04/16/22  [facility]: pt was transferred back from [facility] for SNF placement. Pt developed a UTI for which was treated with course of CTX. Due to norovirus stool PCR being positive again, pt was re-treated with nitazoxanide x7 d course. IV ganciclovir was also re-initiated empirically 01/27/22 given hx of CMV, but transitioned to valganciclovir 400 mg BID indefinitely. Pt began OTC immodium on 02/10/22. Pt was reportedly not working with physical therapy for SNF, re-evaluation pending and thus was needing higher level of care though family (daughter) without the financial resources to do so. Reportedly PEG tube placed 03/01/22 and initiated on tube feeds.  Course further c/b hypotension requiring ICU level of care 03/03/22 - 03/09/22. Patient had multiple paracenesis due to ascites/malnutrition and liver insufficiency.     Patient was seen by Dr. Hervey Ard 03/11/22, feels like diarrhea is improving with PRN medications sp treatment for norovirus. Has remained on treatment for CMV viremia/colitis c/b mild leukopenia nadir WBC .95, with WBC improved off valganciclovir X 2 weeks, after long discussion with inpatient ID. Patient had been started on PO letermovir for CMV colitis, due to concerns for leukopenias.  Due to lack of PO intake, she had PEG tube placed 03/01/22 with tube feeds started. She has had brief episodes of hypotension with ICU stays. Patient has been lethargic, only responding to basic stimuli. Per family request, patient was transferred to [facility]. Of note, 02/23/22 SPEP with stable M-spike, SFLC decreased Kappa 44.2 / Lambda 5.5 = K/L ratio = 8.04 (overall downtrending still).    Family had requested transfer patient to [place] on 5/18, which was not approved. Since that request, patient has remained on valcyte with most recent CMV titers 5/9 <200. Repeat testing of norovirus additionally positive 5/8. She has had continued anasarca with almost weekly paracentesis. Her course overall continues to be complicated by profound weakness, nutritional deficiency and failure to thrive. Her most recent labs  demonstrate WBC ~2.4, Hg 8.6,Plt ~55. Alb 1.1, Bili 8 (~6 dir). This transfer request is initiated by her family, who have requested 2nd opinion Infectious disease consult for norovirus management and 2nd opinion GI consult with Dr. Wilson Singer for consideration of fecal transplant for persistent norovirus.     Of note, patient does not cooperate with care, refuses PT and will not take p.o. despite being able to so, peg/TF dependent. Has poor blood counts, malnutrition, has coccyx/sacral decub. Lengthy conversation with the patient's daughter on 04/13/2022 discussing the  patient's extremely poor prognosis, multiple MD's including oncology and infectious disease and myself agree that the patient is unlikely to survive/recover and we recommend hospice. Dr. Caren Griffins also had lengthy conversation with her. In the meantime the patient is stable does not require any further treatment in house and is unable to go to SNF due to above-mentioned infections. She will be discharged to home with home health and referral to home palliative care. Fever, UCx Klebsiella S LQ- Got 2 days Cefepime, DC with LQ to complete 5 D"    Oncologic History:     Myeloma hx:  IgG Kappa MM on 09/07/2010.  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   June 2020 due to progression of M spike Revlimid was increased to 15 mg PO D1-21 every 28 days, plus dexamethasone  July 2021: started daratumumab, Pomylast 2 mg D 1-21, Q28 days, dexamethasone 12 mg PO Q weekly.   Primary oncologist: Dr. Iona Coach; Dr. Kennon Rounds [place] [facility]  Receiving dexamethasone 12 mg weekly for diarrhea as outpt  Receiving MT with daratumumab with pomalyst and dex, most recent dose 10/16/21, has since been on hold due to worsening diarrhea.  DATA:   09/07/2010: IgG Kappa MM  12/02/2021: Ig M <5, IgG 447, IgA 20, Ig E <2, SFLC Kappa 16.6, Lambda 1.6, Kappa/Lamda 10.38  12/14/21: IgG K on IFE, IgG 430, KLC 16.6, LLC 2.7, KLR 6.15   02/23/22 SPEP with stable M-spike, SFLC decreased Kappa 44.2 / Lambda 5.5 = K/L ratio = 8.04 (overall downtrending still).    Past Medical History:   Diagnosis Date    Acid reflux disease     Anxiety     Depression     Hereditary angioedema (CMS code)     Multiple myeloma, without mention of having achieved remission     IgG kappa    Myeloma (CMS code) 05/10/2011     Past Surgical History:   Procedure Laterality Date    AUTOLOGOUS STEM CELL TRANSPLANTATION  05/12/11    BREAST CYST  EXCISION  1981     Immunization History   Administered Date(s) Administered    DT 01/21/2012, 07/06/2012    DTaP 07/06/2012    Diphtheria 01/21/2012, 07/06/2012    Hep B, Unspecified 08/28/2012    Hep B,Adult 01/21/2012, 08/28/2012    HiB PRP-OMP 01/21/2012, 07/14/2012    HiB PRP-T 01/21/2012, 02/12/2012    Hib, Unspecified 01/21/2012, 07/14/2012    IPV 01/21/2012, 07/24/2012    Influenza 09/30/2015, 09/23/2017, 11/12/2018    Influenza Inj Prsv Free 11/12/2018    Influenza Quad MDCK, Pfree 10/01/2016    Influenza Seasonal Inj 10/13/2009, 08/19/2012, 09/23/2017    Influenza Split 09/14/2013, 10/25/2014, 09/23/2017    Influenza, Unspecified 10/13/2009, 08/19/2012    Influenza-CC 09/28/2019    Pneumococcal Conjugate (Prevnar 13) 01/21/2012, 06/26/2012    Pneumococcal Polysaccharide (PPSV 23) 04/12/2017  Td 01/21/2012, 07/06/2012    Tdap 10/15/2005     This patient is immunocompromised and will not mount an adequate antibody response so will not recieve the pneumococcal or influenza vaccination during this admission. They will be screened and vaccinated in clinic when appropriate.    Allergies: Diphenoxylate-atropine and Caffeine     (Not in a hospital admission)    Social History     Socioeconomic History    Marital status: Married   Tobacco Use    Smoking status: Unknown   Substance and Sexual Activity    Sexual activity: Not Currently     Family History has not been Marked As Reviewed in the last 24 hours. Please review it in the     Review of Systems   Constitutional: Positive for malaise/fatigue and weight loss (40 pound weight loss). Negative for chills and fever.   HENT: Negative.    Eyes: Negative.    Respiratory: Negative.  Negative for cough, shortness of breath and wheezing.    Cardiovascular: Negative.  Negative for chest pain, palpitations, orthopnea, claudication, leg swelling and PND.   Gastrointestinal: Positive for diarrhea (3-4 loose stools per day), heartburn and nausea.  Negative for abdominal pain, blood in stool, constipation and vomiting.   Genitourinary: Negative.  Negative for dysuria, frequency and urgency.   Musculoskeletal: Negative.  Negative for myalgias.   Skin: Negative.         Ecchymosis/ purpura on the bilateral upper extremities   Neurological: Positive for speech change (slowed speech) and weakness (unable to ambulate, uses wheelchair). Negative for dizziness, tingling and headaches.   Endo/Heme/Allergies: Negative.    Psychiatric/Behavioral: Negative.      Vitals  Temp:  [37.2 C (99 F)-37.4 C (99.3 F)] 37.4 C (99.3 F)  Heart Rate:  [86-93] 93  *Resp:  [15-18] 17  BP: (101-116)/(70-74) 102/74  SpO2:  [98 %-100 %] 99 %    No intake or output data in the 24 hours ending 04/23/22 0307    Pain Score: 0    Wt Readings from Last 2 Encounters:   01/15/22 96 kg (211 lb 10.3 oz)   12/12/18 96.3 kg (212 lb 4.9 oz)       KPS  100    Physical Exam  Vitals and nursing note reviewed.   Constitutional:       General: She is not in acute distress.     Comments: Appears frail   HENT:      Head: Normocephalic and atraumatic.      Nose: Nose normal.      Mouth/Throat:      Mouth: Mucous membranes are dry.      Pharynx: Oropharyngeal exudate present.      Comments: Mucous membranes dry,   Oral leukoplakia on the buccal mucosa and anterior tongue, poor dentition.   Eyes:      General: No scleral icterus.     Extraocular Movements: Extraocular movements intact.      Conjunctiva/sclera: Conjunctivae normal.      Pupils: Pupils are equal, round, and reactive to light.   Cardiovascular:      Rate and Rhythm: Normal rate and regular rhythm.      Pulses: Normal pulses.      Heart sounds: Normal heart sounds. No murmur heard.    No friction rub. No gallop.   Pulmonary:      Effort: Pulmonary effort is normal. No respiratory distress.      Breath sounds: Normal breath sounds. No stridor.  No wheezing, rhonchi or rales.   Chest:      Chest wall: No tenderness.   Abdominal:      General:  There is distension.      Palpations: There is no mass.      Tenderness: There is no abdominal tenderness. There is no guarding.      Hernia: No hernia is present.      Comments: Distended, palpable fluid wave,   No asterixis.   No suprapubic tenderness.   No guarding or rebound  PEG tube in place C/D/I   Musculoskeletal:         General: No swelling or tenderness. Normal range of motion.      Cervical back: Normal range of motion and neck supple. No rigidity.      Right lower leg: No edema.      Left lower leg: No edema.   Skin:     Capillary Refill: Capillary refill takes less than 2 seconds.      Coloration: Skin is not jaundiced.      Comments: ecchymoses and purpura on the bilateral forearms   Neurological:      Mental Status: She is alert.      Cranial Nerves: No cranial nerve deficit.      Motor: Weakness present.      Comments: Alert and oriented X3 slowed mentation, memory impaired.     Strength 1-2/5 in the bilateral lower extremities, 4/5 in the bilateral upper extremities, sensation to light touch intact bilaterally.          Data    CBC  No results found in last 72 hours    Coags  No results found in last 72 hours    Chem7  No results found in last 72 hours    Electrolytes  No results found in last 72 hours    Liver Panel  No results found in last 72 hours    Amylase/Lipase  No results found in last 72 hours    Lactate  No results found in last 72 hours    Blood Gas  No results found in last 72 hours    BNP  No results found in last 72 hours    Troponin  No results found in last 72 hours      Lipid Panel  No results found in last 72 hours    Hemoglobin A1c  No results found in last 72 hours    TSH  No results found in last 72 hours    HIV  No results found in last 30 days     CRP  No results found in last 30 days  CMV Ab IgG  No results found in last 365 days    CMV PCR        01/13/22  0446   CMVQT 176*       Hepatitis Panel        12/11/21  1618   HBSAG NEG   HBCAB NEG     UA  No results found in last  7 days    Microscopy:  No results found in last 7 days      Microbiology Results (last 24 hours)     Procedure Component Value Units Date/Time    Bacterial Culture, Normally Sterile Sites, With Gram Stain [[id number]] Collected: 04/23/22 0300    Order Status: Sent Specimen: Not Applicable from Peritoneal Fluid     Cytomegalovirus DNA, Quantitative PCR, Plasma [[id number]] Collected: 04/23/22 0300  Order Status: Sent Specimen: Blood from Serum     Peripheral Blood Culture [[id number]] Collected: 04/23/22 0259    Order Status: Sent Specimen: Peripheral Blood     Peripheral Blood Culture [[id number]] Collected: 04/23/22 0259    Order Status: Sent Specimen: Peripheral Blood     GI Bacteria Panel PCR [[id number]]     Order Status: No result Specimen: Stool     Cytomegalovirus DNA, Quantitative PCR, Plasma [[id number]]     Order Status: Canceled Specimen: Blood     Clostridium Difficile [[id number]]     Order Status: No result Specimen: Not Applicable from Stool     Peripheral Blood Culture [[id number]]     Order Status: Canceled Specimen: Peripheral Blood     Peripheral Blood Culture [[id number]]     Order Status: Canceled Specimen: Peripheral Blood     Urine culture (UCxR) [[id number]]     Order Status: Sent Specimen: Not Applicable from Urine, Midstream         Radiology Results   XR Chest 1 View (AP Portable)    Result Date: 04/23/2022  FINDINGS/IMPRESSION: Low lung volumes. Interval removal of left peripherally inserted central venous catheter. No consolidations. No pleural effusion or pneumothorax. Unchanged cardiomediastinal silhouette.    Problem-based Assessment & Plan    # MM  IgG Kappa MM on 09/07/2010.  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   June 2020 due to progression of M spike Revlimid was increased to 15 mg PO D1-21 every 28 days, plus dexamethasone  July 2021: started  daratumumab, Pomylast 2 mg D 1-21, Q28 days, dexamethasone 12 mg PO Q weekly.   Primary oncologist: Dr. Iona Coach; Dr. Kennon Rounds [place] [facility]  Receiving dexamethasone 12 mg weekly for diarrhea as outpt  Receiving MT with daratumumab with pomalyst and dex, most recent dose 10/16/21, has since been on hold due to worsening diarrhea.    DATA:   09/07/2010: IgG Kappa MM  12/02/2021: Ig M <5, IgG 447, IgA 20, Ig E <2, SFLC Kappa 16.6, Lambda 1.6, Kappa/Lamda 10.38  12/14/21: IgG K on IFE, IgG 430, KLC 16.6, LLC 2.7, KLR 6.15  02/23/22 SPEP with stable M-spike, SFLC decreased Kappa 44.2 / Lambda 5.5 = K/L ratio = 8.04 (overall downtrending still).    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   # Hx of CMV Viremia  Patient with complicated prolonged hospital course both at [facility] and [place] from 1/23-04/16/22, with hypovolemic shock 2/2 norovirus, acute liver failure 2/2 pomylast, CMV colitis with positive CMV PCR 4443,961--> 179, 555-->74. Flex sig showed CMV colitis, sp multiple cycles of gancyclovir IV, IVIG and transitioned to valcyte, 900 mg PO BID, repeat GI viral panel positive for norovirus. Patient was started on rifaximin for suspected HE, patient was transitioned to letermovir 480 mg PO one time daily, due to concerns for leukopenia, 2/2 valcyclovir. Patient is here to follow up with inpatient GI regarding possible fecal transplant for recurrent norovirus and ID for management of CMV colitis, given disease recurrence/refractory disease.     Plan:     -Repeat infectious workup:  C diff studies, GI Pathogen PCR,  CMV viral titers, Blood cultures X 2.   -ID consult and GI consult, discussion regarding possible fecal transplantation for  noro virus. Was previously in communication with [facility] Dr. Wilson Singer ( for fecal transplant)  -If C diff negative, restart immodium     # Worsening abdominal pain and anasarca in the setting of underlying liver disease, with weekly paracentesis  # Hx of pericentral/sinusioidal and periportal fibrosis, ductal reaction with focal pericholangitis from vanishing bile duct in the setting of pomalidomide.   # Hx of Hepatic Encephalopathy  # intermittent hypotension, likely 2/2 underlying liver disease  -Diagnostic paracentesis pending, refused therapeutic para while in the ED  -Start IV Ceftriaxone, as empirical therapy for SBP  -Continue Ursodiol 600 mg PO BID, continue rifaximin 550 mg PO QD  -Consider Hepatobiliary consult   -Low sodium diet, 2 L fluid restriction  -Continue midodrine 10 mg PO TID  -Continue folic acid 1 mg PO one time daily  -Continue thiamine 100 mg PO one time daily  -continue MVI once daily.       # Failure to thrive  # Decreased PO intake with PEG tube dependence.   -Restart TF's at 10 cc/hr with to goal 40 cc/hr pending nutrition consult  -continue marinol 15 mg PO one time daily  -Nutrition consult  -Start dysphagia diet for oral gratification  -IV Zofran Q6hr PRN    # Physical Deconditioning  -PT/OT    No new Assessment & Plan notes have been filed under this hospital service since the last note was generated.  Service: Malignant Hematology    VTE PPx:  Heparin sq, will discontinue if plt <= 50    Severity of Illness  Treating pain with IV opiates.    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

## 2022-04-23 NOTE — Progress Notes (Signed)
[facility] PROGRESS NOTE  ATTENDING ONLY     My date of service is 04/23/22    24 Hour Course  - Admitted to hospital with c/f persistent norovirus. C diff, CMV PCR, and GI pathogen panel were sent   - Diagnostic para negative for SBP. Pt declined therapeutic para    Subjective  - Reports that she has been doing much better since discharge from OSH. Started to have PO intakes at home. Diarrhea is about 2x daily at this point (used to be ~10x daily)     Vitals  Temp:  [37.2 C (99 F)-37.4 C (99.3 F)] 37.4 C (99.3 F)  Heart Rate:  [86-93] 93  *Resp:  [15-18] 17  BP: (99-116)/(68-75) 101/70  SpO2:  [98 %-100 %] 99 %    MostRecent Weight:    Admission Weight:        Intake/Output Summary (Last 24 hours) at 04/23/2022 1346  Last data filed at 04/23/2022 0458  Gross per 24 hour   Intake 100 ml   Output --   Net 100 ml       Pain Score: 0    Physical Exam  Constitutional:       General: She is not in acute distress.     Appearance: She is ill-appearing (chronically ill appearing).   Cardiovascular:      Rate and Rhythm: Normal rate and regular rhythm.   Pulmonary:      Effort: Pulmonary effort is normal. No respiratory distress.      Breath sounds: No wheezing.   Abdominal:      General: There is distension.      Palpations: Abdomen is soft.      Tenderness: There is no abdominal tenderness. There is no guarding.   Musculoskeletal:      Right lower leg: No edema.      Left lower leg: No edema.   Skin:     Findings: Bruising present.   Neurological:      Mental Status: She is alert and oriented to person, place, and time. Mental status is at baseline.   Psychiatric:         Mood and Affect: Mood normal.         Behavior: Behavior normal.         Scheduled Meds:   0.9% sodium chloride flush  3 mL Intravenous E45W SCH    folic acid  1 mg Oral Daily [place]    letermovir  480 mg Oral Daily [place]    lidocaine (PF)  10 mL Subcutaneous Once    mirtazapine  15 mg Oral Daily [place]    multivitamin  1 tablet Oral  Daily [place]    rifAXIMin  550 mg Oral BID SCH    senna  17.2 mg Oral Daily At Bedtime [facility]    thiamine mononitrate (vit B1)  100 mg Oral Daily [place]    ursodiol  600 mg Oral BID [place]    zinc sulfate  220 mg Oral Daily [place]     Continuous Infusions:  PRN Meds:   0.9% sodium chloride flush  3 mL Intravenous PRN    acetaminophen  650 mg Oral Q8H PRN    HYDROcodone-acetaminophen  1 tablet Oral Q6H PRN    ondansetron  4 mg Oral Q6H PRN    Or    ondansetron  4 mg Intravenous Q6H PRN       Data    CBC  04/23/22  0504 04/23/22  0223   WBC 3.4 3.4   HGB 8.2* 8.5*   HCT 25.4* 27.1*   PLT 74* 77*     Coags        04/23/22  0223   INR 1.4*     Chem7        04/23/22  0504 04/23/22  0223   NA 134* 135   K 4.3 4.4   CL 109 109   CO2 21* 20*   BUN 36* 36*   CREAT 0.53* 0.53*   GLU 77 77     Electrolytes        04/23/22  0504 04/23/22  0223   CA 8.1* 8.1*   MG 1.8  --    PO4 3.7  --      Liver Panel        04/23/22  0504 04/23/22  0223   AST 88* 90*   ALT 28 30   ALKP 167* 183*   TBILI 0.9 1.1   TP  --  4.8*   ALB  --  1.9*       Microbiology Results (last 72 hours)     Procedure Component Value Units Date/Time    Cytomegalovirus DNA, Quantitative PCR, Plasma [[id number]]  (Abnormal) Collected: 04/23/22 0223    Order Status: Completed Specimen: Blood from Serum Updated: 04/23/22 1236     CMV DNA (IU/mL) 105 IU/mL      Comment: *SUBCRITICAL*  Performed by Real-Time PCR  Note new reference range.          CMV DNA (Log IU/mL) 2.02     Comment: Log10 IU/mL  Note new reference range.         Clostridium Difficile [[id number]] Collected: 04/23/22 0427    Order Status: Completed Specimen: Not Applicable from Stool Updated: 04/23/22 1157     Comments Reference range: Negative test     Clostridium difficile Toxin gene: Not detected      Comment: Negative test    GI Parasite Panel PCR [[id number]] Collected: 04/23/22 0427    Order Status: Sent Specimen: Stool in [place] Updated: 04/23/22 0846    GI Viral Panel PCR [[id number]]  Collected: 04/23/22 0427    Order Status: Sent Specimen: Stool in [place] Updated: 04/23/22 0846    GI Viral Panel PCR [[id number]]     Order Status: No result Specimen: Stool     GI Parasite Panel PCR [[id number]]     Order Status: No result Specimen: Stool     GI Viral Panel PCR [[id number]]     Order Status: Completed Specimen: Stool     GI Parasite Panel PCR [[id number]]     Order Status: Completed Specimen: Stool     GI Viral Panel PCR [[id number]]     Order Status: Completed Specimen: Stool     GI Parasite Panel PCR [[id number]]     Order Status: Completed Specimen: Stool     Bacterial Culture, Normally Sterile Sites, With Gram Stain [[id number]] Collected: 04/23/22 0028    Order Status: Sent Specimen: Not Applicable from Peritoneal Fluid Updated: 04/23/22 0706    GI Bacteria Panel PCR [[id number]] Collected: 04/23/22 0427    Order Status: Sent Specimen: Stool Updated: 04/23/22 0637    COVID-19 RNA, RT-PCR/Nucleic Acid Amplification Screening (Asymptomatic and No Specific COVID-19 Suspicion); No [[id number]] Collected: 04/23/22 0427    Order Status: Sent Specimen: Not Applicable from Bilateral Anterior Nares +/- OP Swab Updated: 04/23/22 7867    Peripheral Blood Culture [  [id number]] Collected: 04/23/22 0224    Order Status: Sent Specimen: Peripheral Blood Updated: 04/23/22 0515    Peripheral Blood Culture [[id number]] Collected: 04/23/22 0224    Order Status: Sent Specimen: Peripheral Blood Updated: 04/23/22 0515    Peripheral Blood Culture [[id number]]     Order Status: Canceled Specimen: Peripheral Blood     Peripheral Blood Culture [[id number]]     Order Status: Canceled Specimen: Peripheral Blood     MRSA Culture [[id number]]     Order Status: No result Specimen: Anterior Nares Swab     Cytomegalovirus DNA, Quantitative PCR, Plasma [[id number]]     Order Status: Canceled Specimen: Blood     Peripheral Blood Culture [[id number]]     Order Status: Canceled Specimen: Peripheral Blood     Peripheral Blood Culture  [[id number]]     Order Status: Canceled Specimen: Peripheral Blood     Urine culture (UCxR) [[id number]]     Order Status: Sent Specimen: Not Applicable from Urine, Midstream           Radiology Results   XR Chest 1 View (AP Portable)    Result Date: 04/23/2022  FINDINGS/IMPRESSION: Low lung volumes. Interval removal of left peripherally inserted central venous catheter. No consolidations. No pleural effusion or pneumothorax. Unchanged cardiomediastinal silhouette. Moderate volume ascites. Report dictated by: Ilda Foil, MD, signed by: Adrian Saran, MD Department of Radiology and Biomedical Imaging      I spoke with Dr. Boyd Kerbs from malignant hematology regarding A/P.    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   June 2020 due to progression of M spike Revlimid was increased to 15 mg PO D1-21 every 28 days, plus dexamethasone  July 2021: started daratumumab, Pomylast 2 mg D 1-21, Q28 days, dexamethasone 12 mg PO Q weekly.   Primary oncologist: Dr. Iona Coach; Dr. Kennon Rounds [place] [facility]  Receiving dexamethasone 12 mg weekly for diarrhea as outpt  Receiving MT with daratumumab with pomalyst and dex, most recent dose 10/16/21, has since been on hold due to worsening diarrhea.    DATA:   09/07/2010: IgG Kappa MM  12/02/2021: Ig M <5, IgG 447, IgA 20, Ig E <2, SFLC Kappa 16.6, Lambda 1.6, Kappa/Lamda 10.38  12/14/21: IgG K on IFE, IgG 430, KLC 16.6, LLC 2.7, KLR 6.15  02/23/22 SPEP with stable M-spike, SFLC decreased Kappa 44.2 / Lambda 5.5 = K/L ratio = 8.04 (overall downtrending still)  6/8 MM labs pending    Chemo:TBD, currently off therapy due to recurrent infections  IV Access: PIV  Supportive care: transfuse for Hg <7 and Platelets <10    Primary oncologist: Dr. 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   - Has had persistent norovirus s/p two 7-day course of nitazoxanide, but continues to have persistent norovirus (last norovirus testing was positive about 2 weeks ago prior to admission per daughter)  - For CMV, flex sig showed CMV colitis, sp multiple cycles of gancyclovir IV, IVIG, valcyte 900 mg PO BID, then ultimately transitioned to letermovir. her CMV PCR 443,961--> 179,555-->74.   -  On admission, C diff was negative, and CMV was 105 international units/ml    Plan  -Follow up with GI pathogen panel   -If GI pathogen comes back positive, then consider ID consult to discuss about other treatment modalities (e.g. fecal transplantation).    # Hx of acute liver failure, now requiring weekly paracentesis  # Hx of pericentral/sinusioidal and periportal fibrosis, ductal reaction with focal pericholangitis from vanishing bile duct in the setting of pomalidomide.   # Hx of Hepatic Encephalopathy  # intermittent hypotension, likely 2/2 underlying liver disease  -Diagnostic paracentesis negative for SBP, refused therapeutic para while in the ED  -Continue Ursodiol 600 mg PO BID, continue rifaximin 550 mg PO QD  -Low sodium diet, 2 L fluid restriction  -Continue midodrine 10 mg PO TID  -Continue folic acid 1 mg PO one time daily  -Continue thiamine 100 mg PO one time daily  -continue MVI once daily.       # Failure to thrive  # Decreased PO intake with PEG tube dependence.   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

## 2022-04-23 NOTE — Procedures (Signed)
Performed in ED      Paracentesis    Date/Time: 04/23/2022 12:53 AM  Performed by: [name], MD  Authorized by: Jose Persia, MD     Consent Process:     Emergent situation preventing consent process:  No    Discussion with patient included the following (comment on exceptions):  Diagnosis (the reason for which the procedure is being proposed) and proposed treatment or procedure, Risks, benefits, side effects, likelihood of success of the proposed treatment, anticipated recuperation, and the alternative options and Patient's questions related to the procedure were addressed  Universal Protocol - Time Out Checklist:     Patient ID verified:  Yes    Surgical/procedural site and side verified:  Yes    Site marking verified:  Yes  Pre-procedure details:     Procedure purpose:  Diagnostic    Skin preparation:  2% Chlorhexidine    Preparation: Patient was prepped and draped in sterile fashion    Pre-procedure medications (see MAR for exact dosages):     Procedural sedation: patient not sedated    Anesthetic used:  Local infiltration    Local anesthetic:  Lidocaine (ordered)    Amount of local anesthetic injected (ml): 10  Procedure details:     Ultrasound guidance: yes      Puncture site:  R lower quadrant    Needle gauge:  20    Number of attempts:  1      Fluid amount removed (ml): 30    Fluid appearance:  Clear    Fluid sent for: see orders for fluid testing    Dressing: adhesive bandage  Post-procedure details:     Patient tolerance of procedure:  Tolerated well, no immediate complications           [name], MD  Resident  04/23/22 0903       Jose Persia, MD  04/24/22 1206

## 2022-04-24 LAB — URINALYSIS WITH REFLEX TO CULTURE
Bilirubin, Urine: NEGATIVE
Glucose, (UA): NEGATIVE mg/dL
Hemoglobin (UA): NEGATIVE
Hyaline Cast: 0
Ketones, UA: NEGATIVE mg/dL
Nitrite: NEGATIVE
Protein, UA: 30 mg/dL — AB
RBCs, urine: 3 /HPF (ref <3)
Specific Gravity: 1.033 — ABNORMAL HIGH (ref 1.002–1.030)
Squam Epith Cells: >10 /LPF
Urobilinogen: NEGATIVE mg/dL(EU/dL)
WBC Esterase: POSITIVE — AB
WBCs, UR: 5 /HPF (ref <5)
pH, UA: 6 (ref 4.5–8.0)

## 2022-04-24 LAB — COMPLETE BLOOD COUNT WITH DIFFERENTIAL
Abs Basophils: 0.01 10*9/L (ref 0.00–0.10)
Abs Eosinophils: 0.02 10*9/L (ref 0.00–0.40)
Abs Imm Granulocytes: 0.12 10*9/L — ABNORMAL HIGH (ref <0.10)
Abs Lymphocytes: 0.49 10*9/L — ABNORMAL LOW (ref 1.00–3.40)
Abs Monocytes: 0.44 10*9/L (ref 0.20–0.80)
Abs Neutrophils: 1.99 10*9/L (ref 1.80–6.80)
Hematocrit: 26.1 % — ABNORMAL LOW (ref 36.0–46.0)
Hemoglobin: 8.2 g/dL — ABNORMAL LOW (ref 12.0–15.5)
MCH: 34.7 pg — ABNORMAL HIGH (ref 26.0–34.0)
MCHC: 31.4 g/dL (ref 31.0–36.0)
MCV: 111 fL — ABNORMAL HIGH (ref 80–100)
MPV: 12.9 fL — ABNORMAL HIGH (ref 9.1–12.6)
Platelet Count: 82 10*9/L — ABNORMAL LOW (ref 140–450)
RBC Count: 2.36 10*12/L — ABNORMAL LOW (ref 4.00–5.20)
RDW-CV: 18.2 % — ABNORMAL HIGH (ref 11.7–14.4)
WBC Count: 3.1 10*9/L — ABNORMAL LOW (ref 3.4–10.0)

## 2022-04-24 LAB — BASIC METABOLIC PANEL (NA, K, CL, CO2, BUN, CR, GLU, CA)
Anion Gap: 4 (ref 4–14)
Calcium, total, Serum / Plasma: 8.1 mg/dL — ABNORMAL LOW (ref 8.4–10.5)
Carbon Dioxide, Total: 21 mmol/L — ABNORMAL LOW (ref 22–29)
Chloride, Serum / Plasma: 113 mmol/L — ABNORMAL HIGH (ref 101–110)
Creatinine: 0.51 mg/dL — ABNORMAL LOW (ref 0.55–1.02)
Glucose, non-fasting: 119 mg/dL (ref 70–199)
Potassium, Serum / Plasma: 3.8 mmol/L (ref 3.5–5.0)
Sodium, Serum / Plasma: 138 mmol/L (ref 135–145)
Urea Nitrogen, Serum / Plasma: 35 mg/dL — ABNORMAL HIGH (ref 7–25)
eGFRcr: 105 mL/min/{1.73_m2} (ref >59)

## 2022-04-24 LAB — IMMUNOFIXATION ELECTROPHORESIS, SERUM

## 2022-04-24 LAB — PROTEIN ELECTROPHORESIS, SERUM
Albumin, sPEP: 1.8 g/dL — ABNORMAL LOW (ref 3.4–4.7)
Alpha 1 Globulin: 0.3 g/dL (ref 0.1–0.3)
Alpha 2 Globulin: 0.5 g/dL — ABNORMAL LOW (ref 0.6–1.0)
Beta Globulin: 0.7 g/dL (ref 0.7–1.4)
Gamma Globulin: 0.9 g/dL (ref 0.6–1.6)
M-Protein (monoclonal), Serum: 0.6 g/dL — AB
Protein, Total: 4.1 g/dL — ABNORMAL LOW (ref 6.3–8.6)
sPEP Interpretation: ABNORMAL — AB

## 2022-04-24 LAB — PHOSPHORUS, SERUM / PLASMA: Phosphorus, Serum / Plasma: 3.4 mg/dL (ref 2.3–4.7)

## 2022-04-24 LAB — GAMMA-GLUTAMYL TRANSPEPTIDASE: Gamma-Glutamyl Transpeptidase: 125 U/L — ABNORMAL HIGH (ref 8–59)

## 2022-04-24 LAB — BILIRUBIN, TOTAL: Bilirubin, Total: 0.7 mg/dL (ref 0.2–1.2)

## 2022-04-24 LAB — MAGNESIUM, SERUM / PLASMA: Magnesium, Serum / Plasma: 1.7 mg/dL (ref 1.6–2.6)

## 2022-04-24 LAB — VITAMIN D, 25-HYDROXY: Vitamin D, 25-Hydroxy: 11 ng/mL — ABNORMAL LOW (ref 20–50)

## 2022-04-24 LAB — ASPARTATE TRANSAMINASE: AST: 65 U/L — ABNORMAL HIGH (ref 5–44)

## 2022-04-24 LAB — ALKALINE PHOSPHATASE: Alkaline Phosphatase: 186 U/L — ABNORMAL HIGH (ref 38–108)

## 2022-04-24 LAB — ALANINE TRANSAMINASE: Alanine transaminase: 27 U/L (ref 10–61)

## 2022-04-24 MED ORDER — MIRTAZAPINE 15 MG TABLET
15 mg | Freq: Every day | ORAL | Status: DC
Start: 2022-04-24 — End: 2022-04-25
  Administered 2022-04-24
  Administered 2022-04-24: 09:00:00 15 mg via ORAL
  Administered 2022-04-25: 04:00:00 via ORAL

## 2022-04-24 MED ORDER — MAGNESIUM SULFATE 2 GRAM/50 ML (4 %) IN WATER INTRAVENOUS PIGGYBACK
2 | Freq: Once | INTRAVENOUS | Status: AC
Start: 2022-04-24 — End: 2022-04-24
  Administered 2022-04-24: 17:00:00 via INTRAVENOUS

## 2022-04-24 MED ORDER — MAGNESIUM SULFATE 1 GRAM/100 ML IN DEXTROSE 5 % INTRAVENOUS PIGGYBACK
1 | Freq: Once | INTRAVENOUS | Status: AC
Start: 2022-04-24 — End: 2022-04-24
  Administered 2022-04-24: 18:00:00 1 g via INTRAVENOUS

## 2022-04-24 MED ORDER — SUCRALFATE 100 MG/ML ORAL SUSPENSION
100 | Freq: Four times a day (QID) | ORAL | Status: DC
Start: 2022-04-24 — End: 2022-04-25
  Administered 2022-04-25: 04:00:00 via ORAL

## 2022-04-24 MED ORDER — POTASSIUM CHLORIDE ER 20 MEQ TABLET,EXTENDED RELEASE(PART/CRYST)
20 | Freq: Once | ORAL | Status: AC
Start: 2022-04-24 — End: 2022-04-24
  Administered 2022-04-24: 17:00:00 20 meq via ORAL

## 2022-04-24 MED FILL — HYDROCODONE 5 MG-ACETAMINOPHEN 325 MG TABLET: 5-325 mg | ORAL | Qty: 1

## 2022-04-24 MED FILL — URSODIOL 60 MG/ML ORAL SYRUP: 60 mg/mL | ORAL | Qty: 10

## 2022-04-24 MED FILL — ZINC SULFATE 50 MG ZINC (220 MG) CAPSULE: 220 (50) mg | ORAL | Qty: 1

## 2022-04-24 MED FILL — XIFAXAN 550 MG TABLET: 550 mg | ORAL | Qty: 1

## 2022-04-24 MED FILL — KLOR-CON M20 MEQ TABLET,EXTENDED RELEASE: 20 mEq | ORAL | Qty: 1

## 2022-04-24 MED FILL — MIRTAZAPINE 15 MG TABLET: 15 mg | ORAL | Qty: 1

## 2022-04-24 MED FILL — FOLIC ACID 1 MG TABLET: 1 mg | ORAL | Qty: 1

## 2022-04-24 MED FILL — URSODIOL (BULK) 100 % POWDER: 100 % | Qty: 0.6

## 2022-04-24 MED FILL — CHOLECALCIFEROL (VITAMIN D3) 25 MCG (1,000 UNIT) TABLET: 1000 UNITS | ORAL | Qty: 2

## 2022-04-24 MED FILL — TAB-A-VITE 400 MCG TABLET: 400 mcg | ORAL | Qty: 1

## 2022-04-24 MED FILL — MAGNESIUM SULFATE 1 GRAM/100 ML IN DEXTROSE 5 % INTRAVENOUS PIGGYBACK: 1 g/00 mL | INTRAVENOUS | Qty: 100

## 2022-04-24 MED FILL — PREVYMIS 480 MG TABLET: 480 mg | ORAL | Qty: 1

## 2022-04-24 MED FILL — THIAMINE MONONITRATE (VITAMIN B1) 100 MG TABLET: 100 mg | ORAL | Qty: 1

## 2022-04-24 MED FILL — MAGNESIUM SULFATE 2 GRAM/50 ML (4 %) IN WATER INTRAVENOUS PIGGYBACK: 2 gram/50 mL (4 %) | INTRAVENOUS | Qty: 50

## 2022-04-24 NOTE — Interdisciplinary (Signed)
Spiritual Care Services    Patient/Family Declined Visit: Chaplain visited patient at bedside. Introduced self and Spiritual Care Services. Advised that chaplains are available 24/7 by request to bedside nurse. Patient/family declined the visit and patient/family requests a visit at another time. Chaplain has made a note of patient's/family's preferences.    Spiritual Care Provider:   Rev. Terry Abila T. Kamaya Keckler, M.Div.   Chaplain Resident  [employer]      ______________________________________________________________________    Spiritual care is available 24/7. To reach the chaplain on call at any time:  [place] [facility] Chaplain On-Call [place] or call [phone]  [place] [facility] Chaplain On-Call [facility] or call [phone]  [place] [facility] Chaplain On-Call [facility] or call [phone]

## 2022-04-24 NOTE — Progress Notes (Signed)
[facility] PROGRESS NOTE  ATTENDING ONLY     My date of service is 04/24/22    24 Hour Course  Ongoing diarrhea x 3 since midnight    Subjective  Diarrhea worse past few days.  No fevers or chills.    Vitals  Temp:  [36.4 C (97.5 F)-37.3 C (99.1 F)] 37.1 C (98.8 F)  Heart Rate:  [84-94] 89  *Resp:  [16-18] 16  BP: (103-116)/(70-89) 106/73  SpO2:  [99 %-100 %] 100 %    MostRecent Weight:    Admission Weight:        Intake/Output Summary (Last 24 hours) at 04/24/2022 1259  Last data filed at 04/24/2022 1204  Gross per 24 hour   Intake 280 ml   Output 125 ml   Net 155 ml       Pain Score: 7    Physical Exam  Constitutional:       General: She is not in acute distress.     Appearance: She is ill-appearing (chronically ill appearing).   Cardiovascular:      Rate and Rhythm: Normal rate and regular rhythm.   Pulmonary:      Effort: Pulmonary effort is normal. No respiratory distress.      Breath sounds: No wheezing.   Abdominal:      General: There is distension.      Palpations: Abdomen is soft.      Tenderness: There is no abdominal tenderness. There is no guarding.   Musculoskeletal:      Right lower leg: No edema.      Left lower leg: No edema.   Skin:     Findings: Bruising present.   Neurological:      Mental Status: She is alert and oriented to person, place, and time. Mental status is at baseline.   Psychiatric:         Mood and Affect: Mood normal.         Behavior: Behavior normal.         Scheduled Meds:   0.9% sodium chloride flush  3 mL Intravenous Q12H [place]    cholecalciferol (vitamin D3)  2,000 Units Oral Daily [place]    folic acid  1 mg Oral Daily [place]    letermovir  480 mg Oral Daily [place]    lidocaine (PF)  10 mL Subcutaneous Once    mirtazapine  15 mg Oral Daily At Bedtime [facility]    multivitamin  1 tablet Oral Daily [place]    rifAXIMin  550 mg Oral BID SCH    senna  17.2 mg Oral Daily At Bedtime [facility]    thiamine mononitrate (vit B1)  100 mg Oral Daily [place]    ursodiol  600 mg Oral  BID [place]    zinc sulfate  220 mg Oral Daily [place]     Continuous Infusions:  PRN Meds:   0.9% sodium chloride flush  3 mL Intravenous PRN    acetaminophen  650 mg Oral Q8H PRN    HYDROcodone-acetaminophen  1 tablet Oral Q6H PRN    loperamide  4 mg Oral 4x Daily PRN    ondansetron  4 mg Oral Q6H PRN    Or    ondansetron  4 mg Intravenous Q6H PRN       Data    CBC        04/24/22  0551 04/23/22  0504 04/23/22  0223   WBC 3.1* 3.4 3.4   HGB 8.2* 8.2* 8.5*  HCT 26.1* 25.4* 27.1*   PLT 82* 74* 77*     Coags        04/23/22  0223   INR 1.4*     Chem7        04/24/22  0551 04/23/22  0504 04/23/22  0223   NA 138 134* 135   K 3.8 4.3 4.4   CL 113* 109 109   CO2 21* 21* 20*   BUN 35* 36* 36*   CREAT 0.51* 0.53* 0.53*   GLU 119 77 77     Electrolytes        04/24/22  0551 04/23/22  0504 04/23/22  0223   CA 8.1* 8.1* 8.1*   MG 1.7 1.8  --    PO4 3.4 3.7  --      Liver Panel        04/24/22  0551 04/23/22  0504 04/23/22  0223   AST 65* 88* 90*   ALT '27 28 30   '$ ALKP 186* 167* 183*   TBILI 0.7 0.9 1.1   TP  --   --  4.8*   ALB  --   --  1.9*       Microbiology Results (last 72 hours)     Procedure Component Value Units Date/Time    Peripheral Blood Culture [[id number]] Collected: 04/23/22 0224    Order Status: Completed Specimen: Peripheral Blood Updated: 04/24/22 1141     Peripheral Blood Culture No growth 1 day.    Peripheral Blood Culture [[id number]] Collected: 04/23/22 0224    Order Status: Completed Specimen: Peripheral Blood Updated: 04/24/22 1141     Peripheral Blood Culture No growth 1 day.    Urine Culture [[id number]] Collected: 04/24/22 0843    Order Status: Sent Specimen: Not Applicable from Urine, Midstream Updated: 04/24/22 1051    Bacterial Culture, Normally Sterile Sites, With Gram Stain [[id number]] Collected: 04/23/22 0028    Order Status: Completed Specimen: Not Applicable from Peritoneal Fluid Updated: 04/24/22 1045     Gram Stain No PMNs seen , Rare Host cells (not PMNs, RBCs, or squamous epithelial cells) ,  Few RBCs      No organisms seen     Bacterial Culture, Sterile Sites No growth 1 day.    Urine culture (UCxR) [[id number]] Collected: 04/23/22 1746    Order Status: Sent Specimen: Not Applicable from Urine, Midstream     COVID-19 RNA, RT-PCR/Nucleic Acid Amplification Screening (Asymptomatic and No Specific COVID-19 Suspicion); No [[id number]] Collected: 04/23/22 0427    Order Status: Completed Specimen: Not Applicable from Bilateral Anterior Nares +/- OP Swab Updated: 04/23/22 1511     COVID-19 RNA, RT-PCR/Nucleic Acid Amplification Not detected     Comment: (Reported by Lab to Public Health.)  (Results reported to [facility])          Comments See Comment     Comment: Bilateral Anterior nares and OP Swab  in universal transport medium         Cytomegalovirus DNA, Quantitative PCR, Plasma [[id number]]  (Abnormal) Collected: 04/23/22 0223    Order Status: Completed Specimen: Blood from Serum Updated: 04/23/22 1236     CMV DNA (IU/mL) 105 IU/mL      Comment: *SUBCRITICAL*  Performed by Real-Time PCR  Note new reference range.          CMV DNA (Log IU/mL) 2.02     Comment: Log10 IU/mL  Note new reference range.         Clostridium Difficile [[id number]] Collected: 04/23/22 0427  Order Status: Completed Specimen: Not Applicable from Stool Updated: 04/23/22 1157     Comments Reference range: Negative test     Clostridium difficile Toxin gene: Not detected      Comment: Negative test    GI Parasite Panel PCR [[id number]] Collected: 04/23/22 0427    Order Status: Sent Specimen: Stool in [place] Updated: 04/23/22 0846    GI Viral Panel PCR [[id number]] Collected: 04/23/22 0427    Order Status: Sent Specimen: Stool in [place] Updated: 04/23/22 0846    GI Viral Panel PCR [[id number]]     Order Status: Canceled Specimen: Stool     GI Parasite Panel PCR [[id number]]     Order Status: Canceled Specimen: Stool     GI Viral Panel PCR [[id number]]     Order Status: Completed Specimen: Stool     GI Parasite Panel PCR [[id number]]      Order Status: Completed Specimen: Stool     GI Viral Panel PCR [[id number]]     Order Status: Completed Specimen: Stool     GI Parasite Panel PCR [[id number]]     Order Status: Completed Specimen: Stool     GI Bacteria Panel PCR [[id number]] Collected: 04/23/22 0427    Order Status: Sent Specimen: Stool Updated: 04/23/22 0637    Peripheral Blood Culture [[id number]]     Order Status: Canceled Specimen: Peripheral Blood     Peripheral Blood Culture [[id number]]     Order Status: Canceled Specimen: Peripheral Blood     MRSA Culture [[id number]]     Order Status: No result Specimen: Anterior Nares Swab     Cytomegalovirus DNA, Quantitative PCR, Plasma [[id number]]     Order Status: Canceled Specimen: Blood     Peripheral Blood Culture [[id number]]     Order Status: Canceled Specimen: Peripheral Blood     Peripheral Blood Culture [[id number]]     Order Status: Canceled Specimen: Peripheral Blood           Radiology Results  No results found.    I spoke with Dr. Boyd Kerbs from malignant hematology regarding A/P.    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   June 2020 due to progression of M spike Revlimid was increased to 15 mg PO D1-21 every 28 days, plus dexamethasone  July 2021: started daratumumab, Pomylast 2 mg D 1-21, Q28 days, dexamethasone 12 mg PO Q weekly.   Primary oncologist: Dr. Iona Coach; Dr. Kennon Rounds [place] [facility]  Receiving dexamethasone 12 mg weekly for diarrhea as outpt  Receiving MT with daratumumab with pomalyst and dex, most recent dose 10/16/21, has since been on hold due to worsening diarrhea.    DATA:   09/07/2010: IgG Kappa MM  12/02/2021: Ig M <5, IgG 447, IgA 20, Ig E <2, SFLC  Kappa 16.6, Lambda 1.6, Kappa/Lamda 10.38  12/14/21: IgG K on IFE, IgG 430, KLC 16.6, LLC 2.7, KLR 6.15  02/23/22 SPEP with stable M-spike, SFLC decreased Kappa 44.2 / Lambda 5.5 = K/L ratio = 8.04 (overall downtrending still)  6/8 MM labs pending    Chemo:TBD, currently off therapy due to recurrent infections  IV Access: PIV  Supportive care: transfuse for Hg <7 and Platelets <10  Primary oncologist: Dr. 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   - Has had persistent norovirus s/p two 7-day course of nitazoxanide, but continues to have persistent norovirus (last norovirus testing was positive about 2 weeks ago prior to admission per daughter)  - For CMV, flex sig showed CMV colitis, sp multiple cycles of gancyclovir IV, IVIG, valcyte 900 mg PO BID, then ultimately transitioned to letermovir. her CMV PCR 443,961--> 179,555-->74.   - On admission, C diff was negative, and CMV was 105 international units/ml    Plan  -Follow up with GI pathogen panel   -If GI pathogen comes back positive, then consider ID consult to discuss about other treatment modalities (e.g. fecal transplantation).    # Hx of acute liver failure, now requiring weekly paracentesis  # Hx of pericentral/sinusioidal and periportal fibrosis, ductal reaction with focal pericholangitis from vanishing bile duct in the setting of pomalidomide.   # Hx of Hepatic Encephalopathy  # intermittent hypotension, likely 2/2 underlying liver disease  -Diagnostic paracentesis negative for SBP, refused therapeutic para while in the ED  -Continue Ursodiol 600 mg PO BID, continue rifaximin 550 mg PO QD  -Low sodium diet, 2 L fluid restriction  -Continue midodrine 10 mg PO TID  -Continue folic acid 1 mg PO one time  daily  -Continue thiamine 100 mg PO one time daily  -continue MVI once daily.       # Failure to thrive  # Decreased PO intake with PEG tube dependence.   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

## 2022-04-25 LAB — BASIC METABOLIC PANEL (NA, K, CL, CO2, BUN, CR, GLU, CA)
Anion Gap: 4 (ref 4–14)
Calcium, total, Serum / Plasma: 7.9 mg/dL — ABNORMAL LOW (ref 8.4–10.5)
Carbon Dioxide, Total: 20 mmol/L — ABNORMAL LOW (ref 22–29)
Chloride, Serum / Plasma: 113 mmol/L — ABNORMAL HIGH (ref 101–110)
Creatinine: 0.48 mg/dL — ABNORMAL LOW (ref 0.55–1.02)
Glucose, non-fasting: 91 mg/dL (ref 70–199)
Potassium, Serum / Plasma: 4.1 mmol/L (ref 3.5–5.0)
Sodium, Serum / Plasma: 137 mmol/L (ref 135–145)
Urea Nitrogen, Serum / Plasma: 30 mg/dL — ABNORMAL HIGH (ref 7–25)
eGFRcr: 107 mL/min/{1.73_m2} (ref >59)

## 2022-04-25 LAB — MAGNESIUM, SERUM / PLASMA: Magnesium, Serum / Plasma: 2.4 mg/dL (ref 1.6–2.6)

## 2022-04-25 LAB — GAMMA-GLUTAMYL TRANSPEPTIDASE: Gamma-Glutamyl Transpeptidase: 126 U/L — ABNORMAL HIGH (ref 8–59)

## 2022-04-25 LAB — COMPLETE BLOOD COUNT WITH DIFFERENTIAL
Abs Basophils: 0.01 10*9/L (ref 0.00–0.10)
Abs Eosinophils: 0.01 10*9/L (ref 0.00–0.40)
Abs Imm Granulocytes: 0.12 10*9/L — ABNORMAL HIGH (ref <0.10)
Abs Lymphocytes: 0.51 10*9/L — ABNORMAL LOW (ref 1.00–3.40)
Abs Monocytes: 0.51 10*9/L (ref 0.20–0.80)
Abs Neutrophils: 2.15 10*9/L (ref 1.80–6.80)
Hematocrit: 25.3 % — ABNORMAL LOW (ref 36.0–46.0)
Hemoglobin: 8.2 g/dL — ABNORMAL LOW (ref 12.0–15.5)
MCH: 35.7 pg — ABNORMAL HIGH (ref 26.0–34.0)
MCHC: 32.4 g/dL (ref 31.0–36.0)
MCV: 110 fL — ABNORMAL HIGH (ref 80–100)
MPV: 12 fL (ref 9.1–12.6)
Platelet Count: 82 10*9/L — ABNORMAL LOW (ref 140–450)
RBC Count: 2.3 10*12/L — ABNORMAL LOW (ref 4.00–5.20)
RDW-CV: 18.4 % — ABNORMAL HIGH (ref 11.7–14.4)
WBC Count: 3.3 10*9/L — ABNORMAL LOW (ref 3.4–10.0)

## 2022-04-25 LAB — BILIRUBIN, TOTAL: Bilirubin, Total: 0.9 mg/dL (ref 0.2–1.2)

## 2022-04-25 LAB — PHOSPHORUS, SERUM / PLASMA: Phosphorus, Serum / Plasma: 3.6 mg/dL (ref 2.3–4.7)

## 2022-04-25 LAB — ASPARTATE TRANSAMINASE: AST: 70 U/L — ABNORMAL HIGH (ref 5–44)

## 2022-04-25 LAB — ALANINE TRANSAMINASE: Alanine transaminase: 25 U/L (ref 10–61)

## 2022-04-25 LAB — ALKALINE PHOSPHATASE: Alkaline Phosphatase: 165 U/L — ABNORMAL HIGH (ref 38–108)

## 2022-04-25 MED ORDER — ZINC ACETATE 33 MG/ML ORAL SOLN (PEDI BCH-SF)
33 mg/mL (10 mg of elemental zinc/mL) | ORAL | Status: AC
  Administered 2022-04-26 – 2022-05-07 (×21): 25 mg via GASTROSTOMY

## 2022-04-25 MED ORDER — ERGOCALCIFEROL (VITAMIN D2) 200 MCG/ML (8,000 UNIT/ML) ORAL DROPS
200 mcg/mL (8,000 unit/mL) | ORAL | Status: DC
Start: 2022-04-25 — End: 2022-05-14
  Administered 2022-04-26 – 2022-05-09 (×3): via GASTROSTOMY

## 2022-04-25 MED ORDER — SODIUM CHLORIDE 0.9 % INTRAVENOUS SOLUTION
0.9 % | Freq: Every day | INTRAVENOUS | Status: DC
Start: 2022-04-25 — End: 2022-04-30
  Administered 2022-04-25 – 2022-04-30 (×6): 480 mg via INTRAVENOUS

## 2022-04-25 MED ORDER — ZINC ACETATE 33 MG/ML ORAL SOLN (PEDI BCH-SF)
33 | Freq: Two times a day (BID) | ORAL | Status: DC
Start: 2022-04-25 — End: 2022-04-25

## 2022-04-25 MED ORDER — THIAMINE MONONITRATE (VITAMIN B1) 100 MG TABLET
100 | Freq: Every day | ORAL | Status: DC
Start: 2022-04-25 — End: 2022-05-27
  Administered 2022-04-27 – 2022-05-27 (×30): via GASTROSTOMY

## 2022-04-25 MED ORDER — ZINC SULFATE 50 MG ZINC (220 MG) CAPSULE
220 | Freq: Every day | ORAL | Status: DC
Start: 2022-04-25 — End: 2022-04-25

## 2022-04-25 MED ORDER — RIFAXIMIN 550 MG TABLET
550 mg | Freq: Two times a day (BID) | ORAL | Status: DC
Start: 2022-04-25 — End: 2022-05-27
  Administered 2022-04-26 – 2022-05-06 (×21): via GASTROSTOMY
  Administered 2022-05-06: 04:00:00 550 mg via GASTROSTOMY
  Administered 2022-05-06 – 2022-05-27 (×41): via GASTROSTOMY

## 2022-04-25 MED ORDER — URSODIOL 60 MG/ML ORAL SYRUP
60 | Freq: Two times a day (BID) | ORAL | Status: DC
Start: 2022-04-25 — End: 2022-05-27
  Administered 2022-04-26 – 2022-05-27 (×62): 600 mg via GASTROSTOMY

## 2022-04-25 MED ORDER — LETERMOVIR 480 MG TABLET
480 | Freq: Every day | ORAL | Status: DC
Start: 2022-04-25 — End: 2022-04-25

## 2022-04-25 MED ORDER — CHOLECALCIFEROL (VITAMIN D3) 25 MCG (1,000 UNIT) TABLET
1000 UNITS | Freq: Every day | ORAL | Status: AC
Start: 2022-04-25 — End: 2022-05-27
  Administered 2022-04-25 – 2022-05-01 (×6): via GASTROSTOMY
  Administered 2022-05-02 – 2022-05-03 (×2): 2000 [IU] via GASTROSTOMY
  Administered 2022-05-04: 17:00:00 via GASTROSTOMY
  Administered 2022-05-05: 17:00:00 2000 [IU] via GASTROSTOMY
  Administered 2022-05-06: 17:00:00 via GASTROSTOMY
  Administered 2022-05-07 – 2022-05-11 (×5): 2000 [IU] via GASTROSTOMY
  Administered 2022-05-12 – 2022-05-27 (×16): via GASTROSTOMY

## 2022-04-25 MED ORDER — SENNOSIDES 8.6 MG TABLET
8.6 mg | Freq: Every day | ORAL | Status: DC
Start: 2022-04-25 — End: 2022-04-28

## 2022-04-25 MED ORDER — ERGOCALCIFEROL (VITAMIN D2) 1,250 MCG (50,000 UNIT) CAPSULE
50000 | ORAL | Status: DC
Start: 2022-04-25 — End: 2022-04-25

## 2022-04-25 MED ORDER — MULTIVITAMIN WITH FOLIC ACID 400 MCG TABLET
Freq: Every day | ORAL | Status: AC
Start: 2022-04-25 — End: 2022-05-10
  Administered 2022-04-27 – 2022-05-03 (×7): via GASTROSTOMY
  Administered 2022-05-04: 17:00:00 1 via GASTROSTOMY
  Administered 2022-05-05 – 2022-05-10 (×5): via GASTROSTOMY

## 2022-04-25 MED ORDER — MIRTAZAPINE 15 MG TABLET
15 mg | Freq: Every day | ORAL | Status: DC
Start: 2022-04-25 — End: 2022-05-02
  Administered 2022-04-26 – 2022-04-28 (×3): 15 mg via GASTROSTOMY
  Administered 2022-04-29: 05:00:00 via GASTROSTOMY
  Administered 2022-04-30 – 2022-05-02 (×3): 15 mg via GASTROSTOMY

## 2022-04-25 MED FILL — PREVYMIS 480 MG TABLET: 480 mg | ORAL | Qty: 1

## 2022-04-25 MED FILL — HYDROCODONE 5 MG-ACETAMINOPHEN 325 MG TABLET: 5-325 mg | ORAL | Qty: 1

## 2022-04-25 MED FILL — ERGOCALCIFEROL (VITAMIN D2) 1,250 MCG (50,000 UNIT) CAPSULE: 50000 unit | ORAL | Qty: 1

## 2022-04-25 MED FILL — CHOLECALCIFEROL (VITAMIN D3) 25 MCG (1,000 UNIT) TABLET: 1000 UNITS | ORAL | Qty: 2

## 2022-04-25 MED FILL — FOLIC ACID 1 MG TABLET: 1 mg | ORAL | Qty: 1

## 2022-04-25 MED FILL — TAB-A-VITE 400 MCG TABLET: 400 mcg | ORAL | Qty: 1

## 2022-04-25 MED FILL — SUCRALFATE 100 MG/ML ORAL SUSPENSION: 100 mg/mL | ORAL | Qty: 10

## 2022-04-25 MED FILL — XIFAXAN 550 MG TABLET: 550 mg | ORAL | Qty: 1

## 2022-04-25 MED FILL — ZINC SULFATE 50 MG ZINC (220 MG) CAPSULE: 220 (50) mg | ORAL | Qty: 1

## 2022-04-25 MED FILL — PREVYMIS 480 MG/24 ML INTRAVENOUS SOLUTION: 480 mg/24 mL | INTRAVENOUS | Qty: 24

## 2022-04-25 MED FILL — MIRTAZAPINE 15 MG TABLET: 15 mg | ORAL | Qty: 1

## 2022-04-25 MED FILL — ONDANSETRON HCL (PF) 4 MG/2 ML INJECTION SOLUTION: 4 mg/2 mL | INTRAMUSCULAR | Qty: 2

## 2022-04-25 MED FILL — SENNA LAX 8.6 MG TABLET: 8.6 mg | ORAL | Qty: 2

## 2022-04-25 MED FILL — URSODIOL 60 MG/ML ORAL SYRUP: 60 mg/mL | ORAL | Qty: 10

## 2022-04-25 MED FILL — THIAMINE MONONITRATE (VITAMIN B1) 100 MG TABLET: 100 mg | ORAL | Qty: 1

## 2022-04-25 MED FILL — URSODIOL (BULK) 100 % POWDER: 100 % | Qty: 0.6

## 2022-04-25 MED FILL — ERGOCALCIFEROL (VITAMIN D2) 200 MCG/ML (8,000 UNIT/ML) ORAL DROPS: 200 mcg/mL (8,000 unit/mL) | ORAL | Qty: 6.25

## 2022-04-25 NOTE — Progress Notes (Signed)
[facility] PROGRESS NOTE  ATTENDING ONLY     My date of service is 04/25/22    24 Hour Course  Changed meds to per feeding tube  Emesis x 1 overnight  PT eval still pending    Subjective  Diarrhea ongoing.  No fevers or chills.    Vitals  Temp:  [36.3 C (97.3 F)-37 C (98.6 F)] 37 C (98.6 F)  Heart Rate:  [84-92] 84  *Resp:  [16-18] 16  BP: (104-115)/(71-84) 115/84  SpO2:  [99 %-100 %] 100 %    MostRecent Weight: 66.2 kg (146 lb)  Admission Weight: 66.2 kg (146 lb)      Intake/Output Summary (Last 24 hours) at 04/25/2022 1535  Last data filed at 04/25/2022 1500  Gross per 24 hour   Intake 655 ml   Output 225 ml   Net 430 ml       Pain Score: Eyes closed, patient calm    Physical Exam  Constitutional:       General: She is not in acute distress.     Appearance: She is ill-appearing (chronically ill appearing).   Cardiovascular:      Rate and Rhythm: Normal rate and regular rhythm.   Pulmonary:      Effort: Pulmonary effort is normal. No respiratory distress.      Breath sounds: No wheezing.   Abdominal:      General: There is distension.      Palpations: Abdomen is soft.      Tenderness: There is no abdominal tenderness. There is no guarding.   Musculoskeletal:      Right lower leg: No edema.      Left lower leg: No edema.   Skin:     Findings: Bruising present.   Neurological:      Mental Status: She is alert and oriented to person, place, and time. Mental status is at baseline.   Psychiatric:         Mood and Affect: Mood normal.         Behavior: Behavior normal.         Scheduled Meds:   0.9% sodium chloride flush  3 mL Intravenous Q12H [place]    cholecalciferol (vitamin D3)  2,000 Units Per G Tube Daily [place]    ergocalciferol (vitamin D2)  50,000 Units Per G Tube Q7 Days    folic acid  1 mg Oral Daily [place]    letermovir (PREVYMIS) IVPB  480 mg Intravenous Daily [place]    lidocaine (PF)  10 mL Subcutaneous Once    mirtazapine  15 mg Per G Tube Daily At Bedtime [facility]    [START ON 04/26/2022]  multivitamin  1 tablet Per G Tube Daily [place]    rifAXIMin  550 mg Feeding Tube BID SCH    senna  17.2 mg Per G Tube Daily At Bedtime [facility]    [START ON 04/26/2022] thiamine mononitrate (vit B1)  100 mg Per G Tube Daily [place]    ursodiol  600 mg Per G Tube BID [place]    zinc acetate  25 mg of elemental zinc (Zn) Per G Tube BID [place]     Continuous Infusions:  PRN Meds:   0.9% sodium chloride flush  3 mL Intravenous PRN    acetaminophen  650 mg Oral Q8H PRN    HYDROcodone-acetaminophen  1 tablet Oral Q6H PRN    loperamide  4 mg Oral 4x Daily PRN    ondansetron  4 mg Oral Q6H  PRN    Or    ondansetron  4 mg Intravenous Q6H PRN       Data    CBC        04/25/22  0552 04/24/22  0551   WBC 3.3* 3.1*   HGB 8.2* 8.2*   HCT 25.3* 26.1*   PLT 82* 82*     Coags  No results found in last 36 hours  Chem7        04/25/22  0552 04/24/22  0551   NA 137 138   K 4.1 3.8   CL 113* 113*   CO2 20* 21*   BUN 30* 35*   CREAT 0.48* 0.51*   GLU 91 119     Electrolytes        04/25/22  0552 04/24/22  0551   CA 7.9* 8.1*   MG 2.4 1.7   PO4 3.6 3.4     Liver Panel        04/25/22  0552 04/24/22  0551   AST 70* 65*   ALT 25 27   ALKP 165* 186*   TBILI 0.9 0.7       Microbiology Results (last 72 hours)     Procedure Component Value Units Date/Time    Urine Culture [[id number]]  (Abnormal) Collected: 04/24/22 0843    Order Status: Completed Specimen: Not Applicable from Urine, Midstream Updated: 04/25/22 1305     Bacterial Culture, Urine w/o gram stain 100,000 Enterococcus spp      100,000 Yeast isolated (further ID to follow)    Bacterial Culture, Normally Sterile Sites, With Gram Stain [[id number]] Collected: 04/23/22 0028    Order Status: Completed Specimen: Not Applicable from Peritoneal Fluid Updated: 04/25/22 1211     Gram Stain No PMNs seen , Rare Host cells (not PMNs, RBCs, or squamous epithelial cells) , Few RBCs      No organisms seen     Bacterial Culture, Sterile Sites No growth 2 days.    Peripheral Blood Culture [[id number]] Collected:  04/23/22 0224    Order Status: Completed Specimen: Peripheral Blood Updated: 04/25/22 0559     Peripheral Blood Culture No growth 2 days.    Peripheral Blood Culture [[id number]] Collected: 04/23/22 0224    Order Status: Completed Specimen: Peripheral Blood Updated: 04/25/22 0559     Peripheral Blood Culture No growth 2 days.    Urine culture (UCxR) [[id number]] Collected: 04/23/22 1746    Order Status: Sent Specimen: Not Applicable from Urine, Midstream     COVID-19 RNA, RT-PCR/Nucleic Acid Amplification Screening (Asymptomatic and No Specific COVID-19 Suspicion); No [[id number]] Collected: 04/23/22 0427    Order Status: Completed Specimen: Not Applicable from Bilateral Anterior Nares +/- OP Swab Updated: 04/23/22 1511     COVID-19 RNA, RT-PCR/Nucleic Acid Amplification Not detected     Comment: (Reported by Lab to Public Health.)  (Results reported to [facility])          Comments See Comment     Comment: Bilateral Anterior nares and OP Swab  in universal transport medium         Cytomegalovirus DNA, Quantitative PCR, Plasma [[id number]]  (Abnormal) Collected: 04/23/22 0223    Order Status: Completed Specimen: Blood from Serum Updated: 04/23/22 1236     CMV DNA (IU/mL) 105 IU/mL      Comment: *SUBCRITICAL*  Performed by Real-Time PCR  Note new reference range.          CMV DNA (Log IU/mL) 2.02     Comment: Log10  IU/mL  Note new reference range.         Clostridium Difficile [[id number]] Collected: 04/23/22 0427    Order Status: Completed Specimen: Not Applicable from Stool Updated: 04/23/22 1157     Comments Reference range: Negative test     Clostridium difficile Toxin gene: Not detected      Comment: Negative test    GI Parasite Panel PCR [[id number]] Collected: 04/23/22 0427    Order Status: Sent Specimen: Stool in [place] Updated: 04/23/22 0846    GI Viral Panel PCR [[id number]] Collected: 04/23/22 0427    Order Status: Sent Specimen: Stool in [place] Updated: 04/23/22 0846    GI Viral Panel PCR [[id number]]      Order Status: Canceled Specimen: Stool     GI Parasite Panel PCR [[id number]]     Order Status: Canceled Specimen: Stool     GI Viral Panel PCR [[id number]]     Order Status: Completed Specimen: Stool     GI Parasite Panel PCR [[id number]]     Order Status: Completed Specimen: Stool     GI Viral Panel PCR [[id number]]     Order Status: Completed Specimen: Stool     GI Parasite Panel PCR [[id number]]     Order Status: Completed Specimen: Stool     GI Bacteria Panel PCR [[id number]] Collected: 04/23/22 0427    Order Status: Sent Specimen: Stool Updated: 04/23/22 0637    Peripheral Blood Culture [[id number]]     Order Status: Canceled Specimen: Peripheral Blood     Peripheral Blood Culture [[id number]]     Order Status: Canceled Specimen: Peripheral Blood     MRSA Culture [[id number]]     Order Status: Sent Specimen: Anterior Nares Swab     Cytomegalovirus DNA, Quantitative PCR, Plasma [[id number]]     Order Status: Canceled Specimen: Blood     Peripheral Blood Culture [[id number]]     Order Status: Canceled Specimen: Peripheral Blood     Peripheral Blood Culture [[id number]]     Order Status: Canceled Specimen: Peripheral Blood           Radiology Results  No results found.    I spoke with Dr. Boyd Kerbs from malignant hematology regarding A/P.    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   June 2020 due to progression of M spike Revlimid was increased to 15 mg PO D1-21 every 28 days, plus dexamethasone  July 2021: started daratumumab, Pomylast 2 mg D 1-21, Q28 days, dexamethasone 12 mg PO Q weekly.   Primary oncologist: Dr. Iona Coach; Dr. Kennon Rounds [place] [facility]  Receiving dexamethasone 12 mg weekly for  diarrhea as outpt  Receiving MT with daratumumab with pomalyst and dex, most recent dose 10/16/21, has since been on hold due to worsening diarrhea.    DATA:   09/07/2010: IgG Kappa MM  12/02/2021: Ig M <5, IgG 447, IgA 20, Ig E <2, SFLC Kappa 16.6, Lambda 1.6, Kappa/Lamda 10.38  12/14/21: IgG K on IFE, IgG 430, KLC 16.6, LLC 2.7, KLR 6.15  02/23/22 SPEP with stable M-spike, SFLC decreased Kappa 44.2 / Lambda 5.5 = K/L ratio = 8.04 (overall downtrending still)  6/8 MM labs pending  Chemo:TBD, currently off therapy due to recurrent infections  IV Access: PIV  Supportive care: transfuse for Hg <7 and Platelets <10    Primary oncologist: Dr. 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   - Has had persistent norovirus s/p two 7-day course of nitazoxanide, but continues to have persistent norovirus (last norovirus testing was positive about 2 weeks ago prior to admission per daughter)  - For CMV, flex sig showed CMV colitis, sp multiple cycles of gancyclovir IV, IVIG, valcyte 900 mg PO BID, then ultimately transitioned to letermovir. her CMV PCR 443,961--> 179,555-->74.   - On admission, C diff was negative, and CMV was 105 international units/ml    Plan  -Follow up with GI pathogen panel   -If GI pathogen comes back positive, then consider ID consult to discuss about other treatment modalities (e.g. fecal transplantation).        # Hx of acute liver failure, now requiring weekly paracentesis  # Hx of pericentral/sinusioidal and periportal fibrosis, ductal reaction with focal pericholangitis from vanishing bile duct in the setting of pomalidomide.   # Hx of Hepatic Encephalopathy  # intermittent hypotension, likely 2/2 underlying liver disease  -Diagnostic paracentesis negative  for SBP, refused therapeutic para while in the ED  -Continue Ursodiol 600 mg PO BID, continue rifaximin 550 mg PO QD  -Low sodium diet, 2 L fluid restriction  -Continue midodrine 10 mg PO TID  -Continue folic acid 1 mg PO one time daily  -Continue thiamine 100 mg PO one time daily  -continue MVI once daily.       # Failure to thrive  # Decreased PO intake with PEG tube dependence.   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

## 2022-04-26 LAB — GAMMA-GLUTAMYL TRANSPEPTIDASE: Gamma-Glutamyl Transpeptidase: 137 U/L — ABNORMAL HIGH (ref 8–59)

## 2022-04-26 LAB — COMPLETE BLOOD COUNT WITH DIFFERENTIAL
Abs Basophils: 0.03 10*9/L (ref 0.00–0.10)
Abs Eosinophils: 0.02 10*9/L (ref 0.00–0.40)
Abs Imm Granulocytes: 0.12 10*9/L — ABNORMAL HIGH (ref <0.10)
Abs Lymphocytes: 0.63 10*9/L — ABNORMAL LOW (ref 1.00–3.40)
Abs Monocytes: 0.58 10*9/L (ref 0.20–0.80)
Abs Neutrophils: 3.13 10*9/L (ref 1.80–6.80)
Hematocrit: 27.9 % — ABNORMAL LOW (ref 36.0–46.0)
Hemoglobin: 8.5 g/dL — ABNORMAL LOW (ref 12.0–15.5)
MCH: 34.3 pg — ABNORMAL HIGH (ref 26.0–34.0)
MCHC: 30.5 g/dL — ABNORMAL LOW (ref 31.0–36.0)
MCV: 113 fL — ABNORMAL HIGH (ref 80–100)
MPV: 12.7 fL — ABNORMAL HIGH (ref 9.1–12.6)
Platelet Count: 99 10*9/L — ABNORMAL LOW (ref 140–450)
RBC Count: 2.48 10*12/L — ABNORMAL LOW (ref 4.00–5.20)
RDW-CV: 18.3 % — ABNORMAL HIGH (ref 11.7–14.4)
WBC Count: 4.5 10*9/L (ref 3.4–10.0)

## 2022-04-26 LAB — BASIC METABOLIC PANEL (NA, K, CL, CO2, BUN, CR, GLU, CA)
Anion Gap: 7 (ref 4–14)
Calcium, total, Serum / Plasma: 8.1 mg/dL — ABNORMAL LOW (ref 8.4–10.5)
Carbon Dioxide, Total: 18 mmol/L — ABNORMAL LOW (ref 22–29)
Chloride, Serum / Plasma: 115 mmol/L — ABNORMAL HIGH (ref 101–110)
Creatinine: 0.5 mg/dL — ABNORMAL LOW (ref 0.55–1.02)
Glucose, non-fasting: 131 mg/dL (ref 70–199)
Potassium, Serum / Plasma: 3.4 mmol/L — ABNORMAL LOW (ref 3.5–5.0)
Sodium, Serum / Plasma: 140 mmol/L (ref 135–145)
Urea Nitrogen, Serum / Plasma: 32 mg/dL — ABNORMAL HIGH (ref 7–25)
eGFRcr: 106 mL/min/{1.73_m2} (ref >59)

## 2022-04-26 LAB — PHOSPHORUS, SERUM / PLASMA: Phosphorus, Serum / Plasma: 3.1 mg/dL (ref 2.3–4.7)

## 2022-04-26 LAB — GI BACTERIA PANEL PCR
Campylobacter: NOT DETECTED
Comments: NOT DETECTED
E. coli O157: NOT DETECTED
ETEC LT/ST: NOT DETECTED
STEC stx1/stx2: NOT DETECTED
Salmonella: NOT DETECTED
Shigella: NOT DETECTED
Vibrio cholera: NOT DETECTED

## 2022-04-26 LAB — BILIRUBIN, TOTAL: Bilirubin, Total: 0.9 mg/dL (ref 0.2–1.2)

## 2022-04-26 LAB — GI PARASITE PANEL PCR
Comments: NOT DETECTED
Cryptosporidium: NOT DETECTED
Entamoeba histolytica: NOT DETECTED
Giardia: NOT DETECTED

## 2022-04-26 LAB — ASPARTATE TRANSAMINASE: AST: 66 U/L — ABNORMAL HIGH (ref 5–44)

## 2022-04-26 LAB — URINE CULTURE
Bacterial Culture, Urine w/o gram stain: 100000 — AB
Bacterial Culture, Urine w/o gram stain: 100000 — AB

## 2022-04-26 LAB — ALANINE TRANSAMINASE: Alanine transaminase: 30 U/L (ref 10–61)

## 2022-04-26 LAB — GI VIRAL PANEL PCR
Adenovirus 40/41: NOT DETECTED
Comments: NOT DETECTED
Norovirus GI/GII: NOT DETECTED
Rotavirus A: NOT DETECTED

## 2022-04-26 LAB — ALKALINE PHOSPHATASE: Alkaline Phosphatase: 219 U/L — ABNORMAL HIGH (ref 38–108)

## 2022-04-26 LAB — PROT CONCENTRATION, UR: Prot Concentration,UR: 64 mg/dL

## 2022-04-26 LAB — MAGNESIUM, SERUM / PLASMA: Magnesium, Serum / Plasma: 2.1 mg/dL (ref 1.6–2.6)

## 2022-04-26 MED ORDER — LOPERAMIDE 2 MG TABLET
2 | Freq: Four times a day (QID) | ORAL | Status: DC
Start: 2022-04-26 — End: 2022-04-26

## 2022-04-26 MED ORDER — POTASSIUM CHLORIDE 20 MEQ ORAL PACKET
20 | Freq: Once | ORAL | Status: AC
Start: 2022-04-26 — End: 2022-04-26
  Administered 2022-04-26: 18:00:00 via GASTROSTOMY

## 2022-04-26 MED ORDER — LORAZEPAM 1 MG TABLET
1 | ORAL | Status: DC | PRN
Start: 2022-04-26 — End: 2022-04-26

## 2022-04-26 MED ORDER — LORAZEPAM 1 MG TABLET
1 mg | Freq: Once | ORAL | Status: DC | PRN
Start: 2022-04-26 — End: 2022-04-29

## 2022-04-26 MED ORDER — BISMUTH SUBSALICYLATE 262 MG/15 ML ORAL SUSPENSION
262 | Freq: Four times a day (QID) | ORAL | Status: DC | PRN
Start: 2022-04-26 — End: 2022-04-26

## 2022-04-26 MED ORDER — LOPERAMIDE 2 MG TABLET
2 | Freq: Four times a day (QID) | ORAL | Status: DC
Start: 2022-04-26 — End: 2022-05-07
  Administered 2022-04-26 – 2022-04-30 (×15): via GASTROSTOMY
  Administered 2022-05-01: 12:00:00 4 mg via GASTROSTOMY
  Administered 2022-05-01: 06:00:00 via GASTROSTOMY
  Administered 2022-05-01: 19:00:00 4 mg via GASTROSTOMY
  Administered 2022-05-01: 01:00:00 via GASTROSTOMY
  Administered 2022-05-02 – 2022-05-03 (×5): 4 mg via GASTROSTOMY
  Administered 2022-05-03 – 2022-05-04 (×5): via GASTROSTOMY
  Administered 2022-05-04: 02:00:00 4 mg via GASTROSTOMY
  Administered 2022-05-04: 18:00:00 via GASTROSTOMY
  Administered 2022-05-05 (×2): 4 mg via GASTROSTOMY
  Administered 2022-05-05: 12:00:00 via GASTROSTOMY
  Administered 2022-05-05: 20:00:00 4 mg via GASTROSTOMY
  Administered 2022-05-06: 19:00:00 via GASTROSTOMY
  Administered 2022-05-06: 08:00:00 4 mg via GASTROSTOMY
  Administered 2022-05-06 (×2): via GASTROSTOMY
  Administered 2022-05-07 – 2022-05-08 (×5): 4 mg via GASTROSTOMY

## 2022-04-26 MED ORDER — BISMUTH SUBSALICYLATE 262 MG/15 ML ORAL SUSPENSION
262 | Freq: Four times a day (QID) | ORAL | Status: DC | PRN
Start: 2022-04-26 — End: 2022-04-27
  Administered 2022-04-27 (×2): 30 mL via ORAL

## 2022-04-26 MED ORDER — HYDROXYZINE HCL 25 MG TABLET
25 | ORAL | Status: DC | PRN
Start: 2022-04-26 — End: 2022-05-27
  Administered 2022-04-29 – 2022-05-13 (×17): via GASTROSTOMY
  Administered 2022-05-13: 17:00:00 25 mg via GASTROSTOMY
  Administered 2022-05-14 – 2022-05-27 (×20): via GASTROSTOMY

## 2022-04-26 MED ORDER — ONDANSETRON HCL 4 MG TABLET
4 mg | Freq: Four times a day (QID) | ORAL | Status: DC | PRN
Start: 2022-04-26 — End: 2022-04-30

## 2022-04-26 MED ORDER — PROCHLORPERAZINE MALEATE 5 MG TABLET
5 | Freq: Three times a day (TID) | ORAL | Status: DC | PRN
Start: 2022-04-26 — End: 2022-04-29

## 2022-04-26 MED ORDER — LORAZEPAM 1 MG TABLET
1 | Freq: Once | ORAL | Status: DC | PRN
Start: 2022-04-26 — End: 2022-04-26

## 2022-04-26 MED ORDER — POTASSIUM CHLORIDE ER 20 MEQ TABLET,EXTENDED RELEASE(PART/CRYST)
20 | Freq: Once | ORAL | Status: DC
Start: 2022-04-26 — End: 2022-04-26

## 2022-04-26 MED ORDER — PROCHLORPERAZINE EDISYLATE 10 MG/2 ML (5 MG/ML) INJECTION SOLUTION
10 | Freq: Three times a day (TID) | INTRAMUSCULAR | Status: DC | PRN
Start: 2022-04-26 — End: 2022-04-29
  Administered 2022-04-26 – 2022-04-29 (×4): via INTRAVENOUS

## 2022-04-26 MED ORDER — ONDANSETRON HCL (PF) 4 MG/2 ML INJECTION SOLUTION
42 mg/2 mL | Freq: Four times a day (QID) | INTRAMUSCULAR | Status: DC | PRN
Start: 2022-04-26 — End: 2022-04-30
  Administered 2022-04-28: 18:00:00 via INTRAVENOUS
  Administered 2022-04-30: 17:00:00 4 mg via INTRAVENOUS

## 2022-04-26 MED FILL — PREVYMIS 480 MG/24 ML INTRAVENOUS SOLUTION: 480 mg/24 mL | INTRAVENOUS | Qty: 24

## 2022-04-26 MED FILL — PREVYMIS 240 MG/12 ML INTRAVENOUS SOLUTION: 240 mg/12 mL | INTRAVENOUS | Qty: 24

## 2022-04-26 MED FILL — TAB-A-VITE 400 MCG TABLET: 400 mcg | ORAL | Qty: 1

## 2022-04-26 MED FILL — HYDROCODONE 5 MG-ACETAMINOPHEN 325 MG TABLET: 5-325 mg | ORAL | Qty: 1

## 2022-04-26 MED FILL — MIRTAZAPINE 15 MG TABLET: 15 mg | ORAL | Qty: 1

## 2022-04-26 MED FILL — ZINC ACETATE DIHYDRATE (BULK) 100 % CRYSTALS: 100 % | Qty: 0.08

## 2022-04-26 MED FILL — THIAMINE MONONITRATE (VITAMIN B1) 100 MG TABLET: 100 mg | ORAL | Qty: 1

## 2022-04-26 MED FILL — XIFAXAN 550 MG TABLET: 550 mg | ORAL | Qty: 1

## 2022-04-26 MED FILL — ANTI-DIARRHEAL (LOPERAMIDE) 2 MG TABLET: 2 mg | ORAL | Qty: 2

## 2022-04-26 MED FILL — ONDANSETRON HCL (PF) 4 MG/2 ML INJECTION SOLUTION: 4 mg/2 mL | INTRAMUSCULAR | Qty: 2

## 2022-04-26 MED FILL — CHOLECALCIFEROL (VITAMIN D3) 25 MCG (1,000 UNIT) TABLET: 1000 UNITS | ORAL | Qty: 2

## 2022-04-26 MED FILL — PROCHLORPERAZINE EDISYLATE 10 MG/2 ML (5 MG/ML) INJECTION SOLUTION: 10 mg/2 mL (5 mg/mL) | INTRAMUSCULAR | Qty: 2

## 2022-04-26 MED FILL — URSODIOL (BULK) 100 % POWDER: 100 % | Qty: 0.6

## 2022-04-26 MED FILL — POTASSIUM CHLORIDE 20 MEQ ORAL PACKET: 20 mEq | ORAL | Qty: 2

## 2022-04-26 NOTE — Consults (Signed)
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 Emphasis of PT initial evaluation to discuss role of IP PT, patient's functional goals during hospitalization, benefits of participation, and risks of non-participation. At this time, patient expresses interest in participating in therapy with goals of being able to engage in EOB and OOB activities. She will benefit from ongoing PT intervention while in house to maximize patient's overall functional mobility.  Resulting in: physical assist needed for mobility, risk for falls, inability to perform prior life roles  In addition to baseline impairments include: deconditioning, sedentary lifestyle  Factors facilitating functional progress: social support  Complicating factors for functional progress or safe discharge: patient motivation  Anticipate patient can progress to: Mod A for bed mobility and functional transfers, wheelchair mobility household distances    RECOMMENDATIONS  DISCHARGE RECOMMENDATION  Comments Placement with ongoing PT needs  will continue to assess pending patient's participation   Patient Current Functional Status Sufficient for PT Discharge Recommendation No   Discharge DME needs TBD   Discharge transportation needs [facility] Potential       NURSING RECOMMENDATIONS  Inpatient Rehab Assistive Device Recommendation Vertical dependent lift;Ceiling lift;Lateral transfer device   Activity Recommendation turn every 2 hours, bed in chair position     PRIOR LEVEL OF FUNCTION  Prior Living Environment: Per chart review, lives in two-story home with  children. Stairs to enter  Available equipment or existing home modifications: hospital bed, manual wheelchair, commode  Prior functional limitations: Was home for 1 week, mostly bed dependent  Social supports: Daughter  Community access: Family/friends  Occupation(s), roles and routines, exercise habits:    Fall history: Fall History: No  Other comments: PLOF obtained from patient's daughter.    SUBJECTIVE  Subjective report:  "I just need one day to relax"  Patient goals:  To relax  Notable observations:  Left lying in semi-fowler position with family members at bedside. All needs in reach    SYSTEMS REVIEW  Cognition/Communication  Delirium screen: Yes Delirium Screen:   Details:  at risk d/t immobility    Cognition/communication impaired: Yes        Behavior: Self-limiting  Integumentary  Integumentary deficits: Yes  Integumentary deficit detail:  PIV  Cardiopulmonary  Cardiopulmonary deficits: Yes  Detail:  decreased activity tolerance  Musculoskeletal  Musculoskeletal deficits: Musculoskeletal deficits noted: Yes (patient declines assessment)     Neuromuscular  Neuromuscular deficits: Yes (patient declines assessment)       FUNCTIONAL MOBILITY  Precautions/WB status:  skin, falls       Balance  Balance deficits noted:   not assessed at time of PT eval  Exercise/other interventions  Other exercise: Edu on role of IP PT, discussed patient's goals, benefits of participation and risks of nonparticipation       Education assessment  Learner: Patient, Caregiver  Content: Plan of care, Activity recommendations  Response: Needs reinforcement    Communication between other health care providers: OT;RN  Communication comment: pts functional status    Outcome measures   Physical Therapist Global Assessment of Mobility  Activity Achieved: Passive ROM  AMPAC 6-clicks basic mobility score: 7  PLAN  PT frequency:  4x/week  PT duration:  4 weeks.  Comment: POC comment: POC expires 7/10    GOALS  Flowsheet Rows    Flowsheet  Row Most Recent Value   Patient will perform supine< >sitting Mod A   Patient will perform bed to chair transfers Mod A   Additional goal Patient will self-propel in wheelchair x 50' and CGA        Planned PT interventions:   Specific interventions: Progressive functional mobility training;Balance training;NM Re-ed;Aerobic training;Ther ex  Education interventions: Education interventions : Fall risk reduction;Exercise program;Self-pacing/breathing;Benefits of activity;Caregiver training    Lorella Nimrod, PT    04/26/2022

## 2022-04-26 NOTE — Consults (Signed)
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 Currently, pt requires Max/Dep A for ADLs and 2 person assist for bed mobility. Provided education to pt and pt's family on role of therapy and therapy goals with focus on prevention of further functional decline; pt demo decreased insight to functional deficits. Pt expressed desire to engage in EOB/OOB activities as a long-term goal, however declined adamantly today d/t frequent episodes of diarrhea and nausea. Pt's family was supportive at bedside. Will benefit from ongoing therapy services while in house to optimize function. Rec placement    Recommendations    Recommended Discharge Disposition Placement for continued therapy       Discharge DME Recommendations To be determined;Defer to next level of care   Equipment Recommendations     DME Comment     Discharge Recommendations Comment     Rehab Potential Patient participates well in therapy and is progressing towards goal;Unable to tolerate 3 hours of therapy a day   Anticipated Assistance Available at Discharge Family;Spouse/Significant other   Anticipated Type of Assistance Available at Discharge Assist as needed   Anticipated Time of Assistance Available at Discharge Unknown at this time   Barriers to Discharge Medical issues;Pain;Impaired Cognition;Insufficient physical ability;Insufficient activity tolerance   Patient's current functional ability appropriate for D/C recommendations No     Inpatient recommendations  OT Inpatient Recommendations Bed in chair position;Encourage participation in ADLs;Delirium precautions;Out of bed for meals and ADLs;Frequent turns   Recommendation  Comments Lateral transfer to neuro chair   Current Maximal Level of Assist Needed Dependent assist     Prior Level of Function  Prior living environment comments:Per chart review, lives in two-story home with children. 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  Fall history:No  Fall history details:   Additional comments:PLOF obtained from patient's daughter         Currently in pain  Currently in pain: No      Brace/Precautions   Brace/Orthotic/Prosthetic:   Precautions:Has weight bearing limitation or precaution: Yes  Precautions and weight bearing status comments: Falls, delirium     Subjective Report:"I need a vacation."    Patient/Family Goal:"I want to sit up at edge of the bed."    Objective Findings and Interventions    Areas of Occupation    Grooming and Light Hygiene     Cueing Required     Comment     Self Feeding     Cueing Required     Comment     Toileting     Cueing Required     Toileting Clothing Management     Cueing Required     Comment     Upper Body Dressing     Cueing Required     Comment     Lower Body Dressing     Cueing Required     Comment     Upper Body Bathing     Cueing Required     Comment  Lower Body Bathing     Cueing Required     Comment     ADL/IADL Comment Max/Dep A       Functional Transfers    Bed Mobility From Supine   Bed Mobility To Sidelying   Level of Assist Maximal assistance   Cueing Required     Transfer From     Transfer To     Level of Assist     Cueing Required     Technique     Functional Transfer Comment           [facility] AM-PAC for ADLs  6 Click Score: 10    Client Factors  Cognitive deficits noted in the following area(s):: Judgement/Safety,  Attention  Cognition comment:     Problem solving;Safety awareness;Information processing;Problem ID  Executive functions comment: Dec insight                                              Psychosocial comment (coping, adherence): Poor coping, low frustration tolerance    OT Education  Education  Learner: Patient;Caregiver  Content: Plan of care;Activity recommendations  Response: Needs reinforcement  Communication between other health care provider: OT;RN  Communication comment: pts functional status    OT Plan of Care  Planned OT Interventions: Activities of Daily Living retraining;Energy conservation education;Functional transfer training;Assistive/Adaptive device training;Cognitive re-training;Pain Management;Patient/Family/Caregiver education;Therapeutic exercise training;Delirium management;Mental health and wellness  OT Prognosis for Goals: Fair  OT Frequency: 4x/wk  OT Duration: 4;Weeks  Patient/Caregiver Agreeable to OT Plan of Care: Yes    OT Goals  Short Term Goal End Date: 05/24/22  Patient/Family Involved in Goal Setting: Yes    Toileting  Pt will complete toileting activities: With minimal assistance  Upper Body Dressing  Pt will dress upper body: With minimal assistance  Lower Body Dressing  Pt will dress lower body: With minimal assistance  Upper Body Bathing  Pt will bathe upper body: With minimal assistance  Lower Body Bathing  Pt will bathe lower body: With minimal assistance  Bed Mobility for ADLs  Bed Mobility for ADLs: With minimal assistance  Functional Transfers  Chair Transfer: With minimal assistance  Commode Transfer: With minimal assistance  Balance  Pt will maintain balance during: Activities of daily living, Functional transfers, Standing activities  Pt will maintain balance: With moderate assistance  Pt will maintain dynamic standing balance: With upper extremity support  Pt will maintain static standing balance: With upper extremity support  Pt will maintain dynamic sitting balance:  With upper extremity support  Pt will maintain static sitting balance: Without upper extremity support  Education  Patient/Caregiver will demonstrate understanding of: Energy conservation for ADLs, Cognitive strategies for ADLs, Home activity program, Pain management strategies for ADLs  Patient/Caregiver will be independent in: Functional exercise program, Delirium management strategies, Use of durable medical equipment, Use of ADL adaptive equipment               Rasheem Figiel, OT  04/26/2022 3:39 PM

## 2022-04-26 NOTE — Progress Notes (Signed)
[facility] PROGRESS NOTE  ATTENDING ONLY     My date of service is 04/26/22    24 Hour Course  -Afebrile, HDS  -Ongoing diarrhea, loperamide now scheduled ATC  -GI panel neg, including norovirus  -Nausea more responsive to compazine, now 1st line PRN  -PT recommends placement w/ ongoing PT needs  - No sx of dysuria, defer treatment of +UCx from 6/10  - Swallow eval ordered  - Atarax '25mg'$  ordered PRN for anxiety    Subjective  Diarrhea ongoing.  No fevers or chills, no dysuria.    Vitals  Temp:  [36.5 C (97.7 F)-37 C (98.6 F)] 36.5 C (97.7 F)  Heart Rate:  [85-95] 95  *Resp:  [16-18] 18  BP: (103-123)/(74-83) 112/74  SpO2:  [100 %] 100 %    MostRecent Weight: 66.2 kg (146 lb)  Admission Weight: 66.2 kg (146 lb)      Intake/Output Summary (Last 24 hours) at 04/26/2022 1348  Last data filed at 04/26/2022 0000  Gross per 24 hour   Intake 765 ml   Output 400 ml   Net 365 ml       Pain Score: 0    Physical Exam  Gen: NAD, alert and oriented, distressed by diarrhea  HEENT: EOMI, MMM  Resp: CTAB, no crackles/rhonchi/wheezing  Card: RRR, no murmurs  Gi: soft, distended, + bs   Ext: negative BLE edema   Neuro: CN II-XII grossly intact, no focal deficits  Lines: PIV x1 in place, clean dry & intact dressing    [place] Performance Status: 16, Disabled; requires special care and assistance (ECOG equivalent 3)    Scheduled Meds:   0.9% sodium chloride flush  3 mL Intravenous Q12H [place]    cholecalciferol (vitamin D3)  2,000 Units Per G Tube Daily [place]    ergocalciferol (vitamin D2)  50,000 Units Per G Tube Q7 Days    folic acid  1 mg Oral Daily [place]    letermovir (PREVYMIS) IVPB  480 mg Intravenous Daily [place]    lidocaine (PF)  10 mL Subcutaneous Once    loperamide  4 mg Oral Q6H    mirtazapine  15 mg Per G Tube Daily At Bedtime [facility]    multivitamin  1 tablet Per G Tube Daily [facility]    rifAXIMin  550 mg Feeding Tube BID SCH    senna  17.2 mg Per G Tube Daily At Bedtime [facility]    thiamine mononitrate  (vit B1)  100 mg Per G Tube Daily [place]    ursodiol  600 mg Per G Tube BID [facility]    zinc acetate  25 mg of elemental zinc (Zn) Per G Tube BID SCH     Continuous Infusions:  PRN Meds:   0.9% sodium chloride flush  3 mL Intravenous PRN    acetaminophen  650 mg Oral Q8H PRN    HYDROcodone-acetaminophen  1 tablet Oral Q6H PRN    LORazepam  1 mg Feeding Tube Once PRN    ondansetron  4 mg Oral Q6H PRN    Or    ondansetron  4 mg Intravenous Q6H PRN    prochlorperazine  10 mg Intravenous Q8H PRN    Or    prochlorperazine  10 mg Per G Tube Q8H PRN       Data    CBC        04/26/22  0619 04/25/22  0552   WBC 4.5 3.3*   HGB 8.5* 8.2*   HCT  27.9* 25.3*   PLT 99* 82*     Coags  No results found in last 36 hours  Chem7        04/26/22  0619 04/25/22  0552   NA 140 137   K 3.4* 4.1   CL 115* 113*   CO2 18* 20*   BUN 32* 30*   CREAT 0.50* 0.48*   GLU 131 91     Electrolytes        04/26/22  0619 04/25/22  0552   CA 8.1* 7.9*   MG 2.1 2.4   PO4 3.1 3.6     Liver Panel        04/26/22  0619 04/25/22  0552   AST 66* 70*   ALT 30 25   ALKP 219* 165*   TBILI 0.9 0.9       Microbiology Results (last 72 hours)     Procedure Component Value Units Date/Time    Urine Culture [[id number]]  (Abnormal) Collected: 04/24/22 0843    Order Status: Completed Specimen: Not Applicable from Urine, Midstream Updated: 04/26/22 1218     Bacterial Culture, Urine w/o gram stain 100,000  Enterococcus spp  (Susceptibility testing not performed as organisms generally respond to ampicillin, amoxicillin, nitrofurantoin, or doxycycline for uncomplicated cystitis.)        100,000 Candida albicans    Bacterial Culture, Normally Sterile Sites, With Gram Stain [[id number]] Collected: 04/23/22 0028    Order Status: Completed Specimen: Not Applicable from Peritoneal Fluid Updated: 04/26/22 0833     Gram Stain No PMNs seen , Rare Host cells (not PMNs, RBCs, or squamous epithelial cells) , Few RBCs      No organisms seen     Bacterial Culture, Sterile Sites No growth  3 days.    Peripheral Blood Culture [[id number]] Collected: 04/23/22 0224    Order Status: Completed Specimen: Peripheral Blood Updated: 04/26/22 0303     Peripheral Blood Culture No growth 3 days.    Peripheral Blood Culture [[id number]] Collected: 04/23/22 0224    Order Status: Completed Specimen: Peripheral Blood Updated: 04/26/22 0303     Peripheral Blood Culture No growth 3 days.    COVID-19 RNA, RT-PCR/Nucleic Acid Amplification Screening (Asymptomatic and No Specific COVID-19 Suspicion); No [[id number]]     Order Status: Sent Specimen: Not Applicable from Bilateral Anterior Nares +/- OP Swab     Urine culture (UCxR) [[id number]] Collected: 04/23/22 1746    Order Status: Canceled Specimen: Not Applicable from Urine, Midstream     COVID-19 RNA, RT-PCR/Nucleic Acid Amplification Screening (Asymptomatic and No Specific COVID-19 Suspicion); No [[id number]] Collected: 04/23/22 0427    Order Status: Completed Specimen: Not Applicable from Bilateral Anterior Nares +/- OP Swab Updated: 04/23/22 1511     COVID-19 RNA, RT-PCR/Nucleic Acid Amplification Not detected     Comment: (Reported by Lab to Public Health.)  (Results reported to [facility])          Comments See Comment     Comment: Bilateral Anterior nares and OP Swab  in universal transport medium               Radiology Results  No results found.    I spoke with Melanee Left, MD from [facility] regarding A/P.    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  2011: Bortezomib, Lenalidomide,  Dexamethasone  June 2012: Autologous Stem Cell Transplant  September 2015: Restarted revlimid 15 mg every other day, Dexamethasone 8 mg PO weekly.   June 2020 due to progression of M spike Revlimid was increased to 15 mg PO D1-21 every 28 days, plus dexamethasone  July 2021: started daratumumab, Pomylast 2 mg D 1-21, Q28 days, dexamethasone 12  mg PO Q weekly.   Primary oncologist: Dr. Iona Coach; Dr. Kennon Rounds [facility]  Receiving dexamethasone 12 mg weekly for diarrhea as outpt  Receiving MT with daratumumab with pomalyst and dex, most recent dose 10/16/21, has since been on hold due to worsening diarrhea.    DATA:   09/07/2010: IgG Kappa MM  12/02/2021: Ig M <5, IgG 447, IgA 20, Ig E <2, SFLC Kappa 16.6, Lambda 1.6, Kappa/Lamda 10.38  12/14/21: IgG K on IFE, IgG 430, KLC 16.6, LLC 2.7, KLR 6.15  02/23/22 SPEP with stable M-spike, SFLC decreased Kappa 44.2 / Lambda 5.5 = K/L ratio = 8.04 (overall downtrending still)  04/22/22 MM labs: m-spike 0.6, IgG K on IFE, IgG 997, [place] 70.7 / Lambda 19.8 = K/L ratio = 3.57    Chemo:TBD, currently off therapy due to recurrent infections  IV Access: PIV  Supportive care: transfuse for Hg <7 and Platelets <10    Primary oncologist: Dr. 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 First positive for norovirus in 08/2021, continued to have diarrhea and persistent norovirus. Received 7-day course of nitazoxanide (02/16-02/22/23), but norovirus continued to be positive (most recently on 04/07/22). As of 6/9, norovirus now negative.   Regarding CMV colitis, 12/17/21 flex sig showed CMV colitis, sp multiple cycles of gancyclovir IV, IVIG, valcyte 900 mg PO BID, then ultimately transitioned to letermovir. her CMV (iu/mL) 443,961--> 179,555--> 74 (2/22) --> 176 (3/1) --> 105 (6/9)   At home was taking loperamide BID and diarrhea was controlled to 2-3 times daily. Diarrhea had improved to 1 stool on 6/11, but worsened on 6/12 as not receiving loperamide.    Data:  04/07/22 Norovirus pos ('@OSH'$ )  6/9: C. Diff neg, GI bacterial/viral/parasite panels neg, norovirus neg  6/9: CMV PCR: 105 (from 174 on 3/1)    Plan  - Schedule  loperamide '4mg'$  q6h ATC  - GI infectious w/u currently neg, if diarrhea not improved on ATC loperamide consider GI consult to discuss about other treatment modalities (e.g. fecal transplantation).    # Hx of acute liver failure, now requiring weekly paracentesis  # Hx of pericentral/sinusioidal and periportal fibrosis, ductal reaction with focal pericholangitis from vanishing bile duct in the setting of pomalidomide.   # Hx of Hepatic Encephalopathy  # intermittent hypotension, likely 2/2 underlying liver disease  Diagnostic paracentesis negative for SBP, currently refusing therapeutic para and abdomen soft.   -Continue Ursodiol 600 mg PO BID  -Continue rifaximin 550 mg PO QD  -Low sodium diet, 2 L fluid restriction  -Continue midodrine 10 mg PO TID  -Continue folic acid 1 mg PO one time daily  -Continue thiamine 100 mg PO one time daily  -continue MVI once daily     # Failure to thrive  # Decreased PO intake with PEG tube dependence.   -Nutrition consulted  -Continue TF - Nutren 2.0 - start 80m/hr then advance by 255mhr q8h until at goal  68m/hr  -continue marinol 15 mg PO one time daily  -c/s SLP 6/12 for swallow eval as previously tolerating some solids at home  -IV Zofran Q6hr PRN    # Immunocompromised  Immunocompromised due to underlying malignancy. Not currently neutropenic. UCx on 6/10 w/ 100,000CFU enterococcus & C. albicans. Not currently having any sx of dysuria, previous UCx w/ C. albicans. Will defer treatment as asymptomatic sample likely contaminated w/ skin flora.     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)    - Thrombocytopenia (present on admission)    - Coagulopathy (present on admission)    - Malignancy  associated fatigue on admission  - Neoplasm/malignancy associated pain  - Hypoalbuminemia (present on admission)  - HYPOnatremia (present on admission)  - Fluid status: Volume overload on admission  - Cachexia    Code Status: FULL    MEvangeline Gula NP  Hematology-Oncology BMT/CT  Pager:: [phone] Cell: [phone] 04/26/22  1:57 PM     I spent a total time of 50 minutes in this episode of care preparing to see the patient, obtaining and/or reviewing separately obtained history, performing a medically appropriate examination and/or evaluation, counseling and educating patient/family/caregiver, ordering and reviewing medications, tests, or procedures, referring and communicating with other health care professionals, documenting clinical information in the electronic or other health record, and communicating results to the patient/family/caregiver, and/or other care coordination.

## 2022-04-27 LAB — BASIC METABOLIC PANEL (NA, K, CL, CO2, BUN, CR, GLU, CA)
Anion Gap: 4 (ref 4–14)
Calcium, total, Serum / Plasma: 8.2 mg/dL — ABNORMAL LOW (ref 8.4–10.5)
Carbon Dioxide, Total: 21 mmol/L — ABNORMAL LOW (ref 22–29)
Chloride, Serum / Plasma: 117 mmol/L — ABNORMAL HIGH (ref 101–110)
Creatinine: 0.48 mg/dL — ABNORMAL LOW (ref 0.55–1.02)
Glucose, non-fasting: 108 mg/dL (ref 70–199)
Potassium, Serum / Plasma: 3.6 mmol/L (ref 3.5–5.0)
Sodium, Serum / Plasma: 142 mmol/L (ref 135–145)
Urea Nitrogen, Serum / Plasma: 34 mg/dL — ABNORMAL HIGH (ref 7–25)
eGFRcr: 107 mL/min/{1.73_m2} (ref >59)

## 2022-04-27 LAB — PROTEIN ELECTROPHORESIS, URINE
% M-Protein (monoclonal), Urine: NEGATIVE %
uPEP Interpretation: NEGATIVE

## 2022-04-27 LAB — BETA-2 MICROGLOBULIN: Beta-2 Microglobulin: 7.6 mg/L — ABNORMAL HIGH (ref <2.35)

## 2022-04-27 LAB — ALKALINE PHOSPHATASE: Alkaline Phosphatase: 194 U/L — ABNORMAL HIGH (ref 38–108)

## 2022-04-27 LAB — COMPLETE BLOOD COUNT WITH DIFFERENTIAL
Abs Basophils: 0.02 10*9/L (ref 0.00–0.10)
Abs Eosinophils: 0.02 10*9/L (ref 0.00–0.40)
Abs Imm Granulocytes: 0.19 10*9/L — ABNORMAL HIGH (ref <0.10)
Abs Lymphocytes: 0.92 10*9/L — ABNORMAL LOW (ref 1.00–3.40)
Abs Monocytes: 0.78 10*9/L (ref 0.20–0.80)
Abs Neutrophils: 3.21 10*9/L (ref 1.80–6.80)
Hematocrit: 28.5 % — ABNORMAL LOW (ref 36.0–46.0)
Hemoglobin: 8.6 g/dL — ABNORMAL LOW (ref 12.0–15.5)
MCH: 34.1 pg — ABNORMAL HIGH (ref 26.0–34.0)
MCHC: 30.2 g/dL — ABNORMAL LOW (ref 31.0–36.0)
MCV: 113 fL — ABNORMAL HIGH (ref 80–100)
MPV: 13 fL — ABNORMAL HIGH (ref 9.1–12.6)
Platelet Count: 106 10*9/L — ABNORMAL LOW (ref 140–450)
RBC Count: 2.52 10*12/L — ABNORMAL LOW (ref 4.00–5.20)
RDW-CV: 18.3 % — ABNORMAL HIGH (ref 11.7–14.4)
WBC Count: 5.1 10*9/L (ref 3.4–10.0)

## 2022-04-27 LAB — GAMMA-GLUTAMYL TRANSPEPTIDASE: Gamma-Glutamyl Transpeptidase: 128 U/L — ABNORMAL HIGH (ref 8–59)

## 2022-04-27 LAB — ASPARTATE TRANSAMINASE: AST: 57 U/L — ABNORMAL HIGH (ref 5–44)

## 2022-04-27 LAB — MAGNESIUM, SERUM / PLASMA: Magnesium, Serum / Plasma: 2.1 mg/dL (ref 1.6–2.6)

## 2022-04-27 LAB — PHOSPHORUS, SERUM / PLASMA: Phosphorus, Serum / Plasma: 2.7 mg/dL (ref 2.3–4.7)

## 2022-04-27 LAB — BILIRUBIN, TOTAL: Bilirubin, Total: 0.8 mg/dL (ref 0.2–1.2)

## 2022-04-27 LAB — ALANINE TRANSAMINASE: Alanine transaminase: 28 U/L (ref 10–61)

## 2022-04-27 MED ORDER — ALBUTEROL SULFATE 2.5 MG/3 ML (0.083 %) SOLUTION FOR NEBULIZATION
2.530.083 mg /3 mL (0.083 %) | RESPIRATORY_TRACT | Status: DC | PRN
Start: 2022-04-27 — End: 2022-05-27

## 2022-04-27 MED ORDER — EPINEPHRINE 1 MG/ML (1 ML) INJECTION SOLUTION
11 mg/mL ( mL) | Freq: Once | INTRAMUSCULAR | Status: DC | PRN
Start: 2022-04-27 — End: 2022-04-29

## 2022-04-27 MED ORDER — IMMUNE GLOB G 20 GRAM/200 ML(10%)-GLY-IGA AVE 46 MCG/ML INJECTION SOLN
2020010 gram/0 mL (10 %) | Freq: Once | INTRAMUSCULAR | Status: DC
Start: 2022-04-27 — End: 2022-04-27

## 2022-04-27 MED ORDER — DEXAMETHASONE SODIUM PHOSPHATE 4 MG/ML INJECTION SOLUTION: 4 mg/mL | INTRAMUSCULAR | Status: DC | PRN

## 2022-04-27 MED ORDER — IMMUNE GLOB G 20 GRAM/200 ML(10%)-GLY-IGA AVE 46 MCG/ML INJECTION SOLN
20 gram/200 mL (10 %) | INTRAMUSCULAR | Status: AC
  Administered 2022-04-27: 20:00:00 via INTRAVENOUS

## 2022-04-27 MED ORDER — ALUMINUM-MAG HYDROXIDE-SIMETHICONE 200 MG-200 MG-20 MG/5 ML ORAL SUSP
200-200-205 mg/5 mL | ORAL | Status: DC | PRN
Start: 2022-04-27 — End: 2022-04-30
  Administered 2022-04-30: 13:00:00 15 mL via ORAL
  Administered 2022-04-30: 04:00:00 via ORAL

## 2022-04-27 MED ORDER — HYDROCORTISONE SOD SUCCINATE (PF) 100 MG/2 ML SOLUTION FOR INJECTION
100 | Freq: Once | INTRAMUSCULAR | Status: DC | PRN
Start: 2022-04-27 — End: 2022-04-27

## 2022-04-27 MED ORDER — OPIUM TINCTURE 10 MG/ML (MORPHINE) ORAL
10 | Freq: Four times a day (QID) | ORAL | Status: DC | PRN
Start: 2022-04-27 — End: 2022-05-27
  Administered 2022-04-27: 21:00:00 6 mg via ORAL
  Administered 2022-04-28: 05:00:00 via ORAL

## 2022-04-27 MED ORDER — DIPHENHYDRAMINE 50 MG/ML INJECTION SOLUTION
50 mg/mL | Freq: Once | INTRAMUSCULAR | Status: DC | PRN
Start: 2022-04-27 — End: 2022-04-29

## 2022-04-27 MED ORDER — IMMUNE GLOBU G 5 GRAM/50 ML(10 %)-GLY-IGA AVE 46 MCG/ML INJECTION SOLN
5 gram/50 mL (10 %) | INTRAMUSCULAR | Status: AC
  Administered 2022-04-27: 23:00:00 via INTRAVENOUS

## 2022-04-27 MED ORDER — ALBUTEROL SULFATE CONCENTRATE 2.5 MG/0.5 ML SOLUTION FOR NEBULIZATION
RESPIRATORY_TRACT | Status: DC | PRN
Start: 2022-04-27 — End: 2022-05-27

## 2022-04-27 MED FILL — ANTI-DIARRHEAL (LOPERAMIDE) 2 MG TABLET: 2 mg | ORAL | Qty: 2

## 2022-04-27 MED FILL — MIRTAZAPINE 15 MG TABLET: 15 mg | ORAL | Qty: 1

## 2022-04-27 MED FILL — PREVYMIS 240 MG/12 ML INTRAVENOUS SOLUTION: 240 mg/12 mL | INTRAVENOUS | Qty: 24

## 2022-04-27 MED FILL — URSODIOL (BULK) 100 % POWDER: 100 % | Qty: 0.6

## 2022-04-27 MED FILL — HYDROCODONE 5 MG-ACETAMINOPHEN 325 MG TABLET: 5-325 mg | ORAL | Qty: 1

## 2022-04-27 MED FILL — CHOLECALCIFEROL (VITAMIN D3) 25 MCG (1,000 UNIT) TABLET: 1000 UNITS | ORAL | Qty: 2

## 2022-04-27 MED FILL — PREVYMIS 480 MG/24 ML INTRAVENOUS SOLUTION: 480 mg/24 mL | INTRAVENOUS | Qty: 24

## 2022-04-27 MED FILL — STOMACH RELIEF 262 MG/15 ML ORAL SUSPENSION: 262 mg/15 mL | ORAL | Qty: 30

## 2022-04-27 MED FILL — ZINC ACETATE DIHYDRATE (BULK) 100 % CRYSTALS: 100 % | Qty: 0.08

## 2022-04-27 MED FILL — XIFAXAN 550 MG TABLET: 550 mg | ORAL | Qty: 1

## 2022-04-27 MED FILL — FOLIC ACID 1 MG TABLET: 1 mg | ORAL | Qty: 1

## 2022-04-27 MED FILL — TAB-A-VITE 400 MCG TABLET: 400 mcg | ORAL | Qty: 1

## 2022-04-27 MED FILL — MAG-AL PLUS 200 MG-200 MG-20 MG/5 ML ORAL SUSPENSION: 200-200-20 mg/5 mL | ORAL | Qty: 30

## 2022-04-27 MED FILL — URSODIOL 60 MG/ML ORAL SYRUP: 60 mg/mL | ORAL | Qty: 10

## 2022-04-27 MED FILL — GAMUNEX-C 5 GRAM/50 ML (10 %) INJECTION SOLUTION: 5 gram/0 mL (10 %) | INTRAMUSCULAR | Qty: 50

## 2022-04-27 MED FILL — GAMUNEX-C 20 GRAM/200 ML (10 %) INJECTION SOLUTION: 20 gram/0 mL (10 %) | INTRAMUSCULAR | Qty: 200

## 2022-04-27 MED FILL — OPIUM TINCTURE 10 MG/ML (MORPHINE) ORAL: 10 mg/mL (morphine) | ORAL | Qty: 1

## 2022-04-27 MED FILL — PROCHLORPERAZINE EDISYLATE 10 MG/2 ML (5 MG/ML) INJECTION SOLUTION: 10 mg/2 mL (5 mg/mL) | INTRAMUSCULAR | Qty: 2

## 2022-04-27 MED FILL — THIAMINE MONONITRATE (VITAMIN B1) 100 MG TABLET: 100 mg | ORAL | Qty: 1

## 2022-04-27 NOTE — Consults (Signed)
PHYSICAL THERAPY CANCELED SESSION NOTE     Cancellation: Cancelled session  PT session missed for the following reasons (S): Patient requests deferring treatment  Missed/Cancelled Session Comment: Patient requests to defer PT session d/t fatigue from eventful morning. Will follow up as able.  Attempted Therapy Session Time:: [place], [place]  04/27/2022

## 2022-04-27 NOTE — Consults (Addendum)
Gastroenterology Initial Consultation Note   Consult question:  Diarrhea  Consulting service: Oncology   Consulting attending: Hassell Done    HPI:    Ariel Braun is a 61 year old female with pmhx of IgG Kappa MM sp auto HCT 2012 but with progression of disease, last treated with daratumumab/pomalyst/dexamethasone in 10/2021 who presents for diarrhea and failure to thrive.      She has had a long course of diarrhea and daughter provided most of history. Prior to 06/2021 she would have intermittent diarrhea usually with chemotherapy but also with covid shots would have a week of diarrhea. 06/2021 had her 4th shot of covid and since then has had severe watery diarrhea. Initially it was up to 10 times a day, all day and all night. She was found to have norovirus 09/2021 and ultimately was admitted to [facility]18/[facility] for fatigue, diarrhea. Admitted to ICU for hypovolemic shock. Was persistently +norovirus. This hospital stay was complicated by severe acute liver injury and concern for ALF and was transferred to [facility]. Was admitted [facility] 1/25-01/15/2022 - HVPG was 8 and liver biopsy was done. 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 As for her diarrhea, ID and GI were consulted. FS on 12/17/2021 with erythema and biopsy + CMV. She also had profound Viremia and was treated with Ganciclovir. She also received nitazoxanide  for her norovirus. Eventually thought to be colonizer of norovirus. She was ultimately transferred back to [facility] 3/3-04/16/2022. She underwent another course of nitazoxanide, last 3/29. She was given oral hyper immunoglobulin. Despite this ongoing virus shedding until 5/26. 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  Interestingly also developed large volume ascites requiring LVPs (multiple times a week --> now ~once every two weeks per daughter). She also started to decline PO due to Lack of appetite as well  and crampiness (improved in some part by Bms, pain when ascites is large) and nausea but rare emesis. PEG tube placed 02/2022. She was actually Dc'ed with discussion for hospice Per daughter, at home since DC her diarrhea had improved with the imodium 1-2 times a day and at times could even start to look formed. Daughter is currently not interested in hospice. 6/6 Onc visit and due to concern for FTT and diarrhea, recommended inpatient management.     She was admitted 6/8 PM. Afebrile, Hr 93 BP 101/70 stable on RA. She has had 1-2 Bms a day, though day prior was 11BMs and today per RN with very diffuse watery Bms that required complete cleaning of patient. Daughter thinks this is definitely worse than at home due to not receiving daily imodium. Now has been resumed on standing imodium now at 85m QID. Vitals in past 24 hours are stable. Her GI infectious studies are negative including C diff and norovirus. Her CMV burden is also much improved from >400K in 11/2021 and now 105 this admission. CBC stable (WBC 3-5s, Hgb 8s, Plts 70-100), Chemistries with BUN/Cr 36/0.53 albumin <2.    Past Medical History:  Past Medical History:   Diagnosis Date    Acid reflux disease     Anxiety     Depression     Hereditary angioedema (CMS code)     Multiple myeloma, without mention of having achieved remission     IgG kappa    Myeloma (CMS code) 05/10/2011       Past Surgical History:   Procedure Laterality Date  AUTOLOGOUS STEM CELL TRANSPLANTATION  05/12/11    BREAST CYST EXCISION  1981        Medications:  Current Facility-Administered Medications   Medication Dose Route Frequency Provider Last Rate Last Admin    0.9% sodium chloride flush injection syringe 3 mL  3 mL Intravenous Q12H [facility] [name], DO   3 mL at 04/27/22 1004    0.9% sodium chloride flush injection syringe 3 mL  3 mL Intravenous PRN [name], DO        acetaminophen (TYLENOL) tablet 650 mg  650 mg Oral Q8H PRN [name], DO        albuterol  (PROVENTIL) 2.5 mg /3 mL (0.083 %) inhalation solution 2.5 mg  2.5 mg Nebulization Q4H PRN [name], NP        Or    albuterol (PROVENTIL) inhalation solution 2.5 mg  2.5 mg Nebulization Q4H PRN [name], NP        cholecalciferol (vitamin D3) tablet 2,000 Units  2,000 Units Per G Tube Daily [facility] [name], MD   2,000 Units at 04/27/22 0932    dexamethasone (DECADRON) injection 4 mg  4 mg Intravenous Once PRN Clelia Schaumann, PharmD        diphenhydrAMINE (BENADRYL) injection 50 mg  50 mg Intravenous Once PRN [name], NP        EPINEPHrine (ADRENALIN) 1 mg/mL (1 mL) injection 0.3 mg  0.3 mg Intramuscular Once PRN [name], NP        ergocalciferol (vitamin D2) (DRISDOL) drops 50,000 Units  50,000 Units Per G Tube Q7 Days [name], MD   50,000 Units at 70/35/00 9381    folic acid (FOLVITE) tablet 1 mg  1 mg Oral Daily [facility] [name], DO   1 mg at 04/27/22 0932    HYDROcodone-acetaminophen (NORCO) 5-325 mg tablet 1 tablet  1 tablet Oral Q6H PRN [name], DO   1 tablet at 04/27/22 1106    hydrOXYzine (ATARAX) tablet 25 mg  25 mg Feeding Tube Q4H PRN [name], NP        immune globulin (human) (GAMUNEX-C) 20 gram/200 mL (10 %) injection 20 g  20 g Intravenous Once Clelia Schaumann, PharmD        And    immune globulin (human) (GAMUNEX-C) 5 gram/50 mL (10 %) injection 5 g  5 g Intravenous Once Clelia Schaumann, PharmD        letermovir (PREVYMIS) 480 mg in sodium chloride 0.9 % 250 mL IVPB  480 mg Intravenous Daily [facility] [name], MD 250 mL/hr at 04/27/22 0933 480 mg at 04/27/22 0933    lidocaine (PF) (XYLOCAINE) 10 mg/mL (1 %) injection 10 mL  10 mL Subcutaneous Once [name], DO        loperamide (IMODIUM A-D) tablet 4 mg  4 mg Feeding Tube Q6H [place], NP   4 mg at 04/27/22 0557    LORazepam (ATIVAN) tablet 1 mg  1 mg Feeding Tube Once PRN [name], MD        mirtazapine (REMERON) tablet 15  mg  15 mg Per G Tube Daily At Bedtime [place], MD   15 mg at 04/27/22 0031    multivitamin tablet 1 tablet  1 tablet Per G Tube Daily [facility] [name], MD   1 tablet at 04/27/22 0932    ondansetron [facility])  tablet 4 mg  4 mg Oral Q6H PRN [name], NP        Or    ondansetron [facility]) injection 4 mg  4 mg Intravenous Q6H PRN [name], NP        opium tincture 6 mg  6 mg Oral 4x Daily PRN [name], NP        prochlorperazine (COMPAZINE) injection 10 mg  10 mg Intravenous Q8H PRN [name], NP   10 mg at 04/26/22 2112    Or    prochlorperazine (COMPAZINE) tablet 10 mg  10 mg Per G Tube Q8H PRN [name], NP        rifAXIMin Doreene Nest) tablet 550 mg  550 mg Feeding Tube BID [facility] [name], MD   550 mg at 04/27/22 0932    senna (SENOKOT) tablet 17.2 mg  17.2 mg Per G Tube Daily At Bedtime [place], MD        thiamine mononitrate (vit B1) tablet 100 mg  100 mg Per G Tube Daily [facility] [name], MD   100 mg at 04/27/22 0933    ursodiol (ACTIGALL) syrup 600 mg  600 mg Per G Tube BID [facility] [name], MD   600 mg at 04/27/22 0932    zinc acetate solution 25 mg of elemental zinc (Zn)  25 mg of elemental zinc (Zn) Per G Tube BID [place], PharmD   25 mg of elemental zinc (Zn) at 04/27/22 0931       Allergies:   She is allergic to diphenoxylate-atropine and caffeine.    Social History:  Social History     Socioeconomic History    Marital status: Married     Spouse name: Not on file    Number of children: Not on file    Years of education: Not on file    Highest education level: Not on file   Occupational History    Not on file   Tobacco Use    Smoking status: Unknown    Smokeless tobacco: Not on file   Substance and Sexual Activity    Alcohol use: Not on file    Drug use: Not on file    Sexual activity: Not Currently   Other Topics Concern    Not on file   Social History Narrative    Not on file     Social  Determinants of Health     Financial Resource Strain: Not on file   Food Insecurity: Not on file   Transportation Needs: Not on file     Family History:   Her family history includes Colon cancer in her father; Diabetes in her mother; Hypertension in her brother and mother.       Physical Exam:  Vital Signs:  BP 115/85 (BP Location: Left upper arm, Patient Position: Lying)   Pulse 101   Temp 36.7 C (98.1 F) (Oral)   Resp 18   Wt 66.2 kg (146 lb)   SpO2 98%   BMI 28.29 kg/m   General appearance:  frail, NAD  HEENT:  dry mucous membranes  Heart:  RRR   Lungs:  Normal respiratory effort  Abdomen:  distended, non tender. PEG site mildly erythematous but non purulent, dry and intact.   Skin: ecchymoses   Neuro:  tired but awakes to voice and oriented     Data:   I/Os: I/O last 3 completed shifts:  In: 57 [  NG/GT:240; Feeding Tube:450]  Out: 650 [Urine:300; Stool:350]    Labs:  Recent Labs     04/27/22  0650 04/26/22  0619 04/25/22  0552   NA 142 140 137   K 3.6 3.4* 4.1   CL 117* 115* 113*   CO2 21* 18* 20*   BUN 34* 32* 30*   CREAT 0.48* 0.50* 0.48*   CA 8.2* 8.1* 7.9*   MG 2.1 2.1 2.4   PO4 2.7 3.1 3.6     Recent Labs     04/27/22  0650 04/26/22  0619 04/25/22  0552   ALT 28 30 25    AST 57* 66* 70*   ALKP 194* 219* 165*   TBILI 0.8 0.9 0.9     Recent Labs     04/27/22  0650 04/26/22  0619 04/25/22  0552   WBC 5.1 4.5 3.3*   HGB 8.6* 8.5* 8.2*   HCT 28.5* 27.9* 25.3*   MCV 113* 113* 110*   PLT 106* 99* 82*     No results for input(s): INR, PTT in the last 72 hours.    Microbiology:  GI PCR bacteria, viral, parasite neg  C diff neg  CMV PCR 105 international units/mL    Radiology:     OSH CTAP 03/11/2022   Findings:   Bones and Soft Tissues: Diffuse anasarca.  Mixed lytic and sclerotic osseous metastatic disease is present within the axial skeleton without pathologic fracture and largely stable from prior examination.   Lower Thorax: Large bilateral pleural effusions and adjacent enhancing atelectasis.   Liver:  Hepatic steatosis.   Biliary Tree: No intrahepatic or common biliary ductal dilatation.   Gallbladder: Normal gallbladder.   Spleen: Within normal limits   Pancreas: The pancreas enhances homogeneously.  There is no main pancreatic ductal dilatation.  No distal pancreatic atrophy. No peripancreatic stranding.   Adrenal glands: Normal bilateral adrenal glands.   Kidneys: No enhancing renal masses. Small subcentimeter cyst in the interpolar right kidney.   Bowel: Percutaneous gastrostomy tube in place. Large volume abdominopelvic ascites. Significant fecal burden present within the colon. Mid small bowel mural thickening with submucosal edema and mucosal hyperemia, predominantly involving the jejunum also   seen in the ascending colon.   Vessels: Normal sized aorta.   Lymph nodes: There is no inguinal, external iliac, pelvic sidewall, internal iliac, or common iliac adenopathy. No gastrohepatic ligament, porta hepatis, portacaval, or retroperitoneal adenopathy.   Pelvic Organs: Small left adnexal cyst measuring 1.4 cm, stable. No right adnexal masses.Hypervascularity seen in the posterior right uterine body measuring 9 mm, likely reflecting a fibroid.   Bladder: Foley catheter present within a largely decompressed urinary bladder.     IMPRESSION:   1. Mid small bowel mural thickening with submucosal edema and mucosal hyperemia, predominantly involving the jejunum and ascending colon.  Findings are primarily consistent with an infectious enterocolitis. No evidence of bowel perforation or abscess.   2.  Large volume abdominopelvic ascites.   3.  Osseous disease from patient's known multiple myeloma again noted without significant interval change or evidence of acute fracture.   4.  Large bilateral pleural effusions and adjacent atelectasis.     Endoscopies:  12/17/2021 FS  IMPRESSIONS:       1.  Edematous colonic wall throughout with patches of   erythema.  Friable tissue.  Multiple biopsies were taken to rule out   CMV.    2. Retroflexion was not performed       INAL PATHOLOGIC DIAGNOSIS  Colon, biopsy:  Colonic mucosa with a rare small non-necrotizing mucosal  granuloma and rare CMV immunolabeling; see comment.        COMMENT:  Sections show colonic mucosa with a rare small non-necrotizing  granuloma, but no active colitis.  There is relatively depleted lamina  propria inflammatory cells and no overtly atypical plasma cells,  organisms, or viropathic change.  An initial CMV immunostain shows rare  luminal staining, possibly artifactual, but on repeat CMV stain there is  more convincing labeling of rare epithelial cells, and possibly  endothelial cells and/or lymphocytes, to raise consideration for a CMV  contribution to diarrhea (though a norovirus infection cannot be  excluded).  [place], [name], and GMS stains are negative for organisms.   Adenovirus immunostain is negative.  Kappa/lambda immunostains show  ~2-3:1 ratio (K:L) supporting a polytypic plasma cell population and  providing no evidence of a plasma cell neoplasm.    03/01/2022 EGD+PEG placement  UPPER ENDOSCOPY PROCEDURE    Findings:  ESOPHAGUS - The mucosa had no ulcers or erosions, strictures, masses, or varices.    STOMACH - Minimal clear fluid was found in the stomach and easily aspirated  The mucosa was normal with no ulcers, erosions, AVM's, polyps, or masses seen    DUODENUM - The first and second portion of the duodenum was normal without ulcers, erosions, AVMs, strictures, or masses seen    An appropriate location for PEG placement was found by transillumination and palpation of the anterior abdominal wall. The skin was prepped and draped in a sterile fashion, then the skin and underlying soft tissue were anesthestized with lidocaine. A needle was passed through the abdominal wall and grasped with snare, and brought out through the patient's mouth along with the endoscope. Following this, a 20 Fr PEG tube was placed by the pull technique. The patient tolerated  the procedure well, and there were no complications. He was taken to the recovery area in stable condition.    BIOPSY SPECIMENS:  * No specimens in log *  None    EGD IMPRESSION:  Normal esophagus, stomach, and duodenum  Successful PEG placement - 2 g of Ancef given at 1500    Assessment/Plan:    Ariel Braun is a 61 year old female with pmhx of IgG Kappa MM sp auto SCT 2012 but with progression of disease, last treated with daratumumab/pomalyst/dexamethasone in 10/2021 who presents for diarrhea and failure to thrive    She has had a prolonged course of diarrhea with both norovirus and CMV Colitis complicated at one point by hypovolemic shock. She is now being treated for CMV viremia with letemovir (previously ganciclovir, valcyte), sp norovirus treatment with nitazoxanide x2. With these she has had much improvement in her diarrhea but still using imodium for antidiarrheal use and overall failure to thrive with poor PO intake requiring PEG tube. She has new ascites despite no signs of cirrhosis and her albumin remains <2 despite on TF. Her prior endoscopies include EGD (normal but no biopsies) and FS (mild erythema, +CMV). Her last imaging 02/2022 jejunal and ascending colon enhancement.   In this immunocompromise patient and known complicated infectious course, infectious etiology is of concern but she also has had thorough treatment. Another possibility is PLE secondary to her known malignancy for which treatment has been held due to recurrent infections and diarrhea (may explain her low albumin, cb ascites, also with low IgG). Chemo related diarrhea is another etiology, but her last endoscopy biopsies did not seem to suggest  ICI related colitis or cytotoxic colon injury. SIBO and functional (like post infectious IBS) are possible.     We discussed EGD/Colonoscopy with patient to evaluate for above etiologies. For now, would like to trial anti-diarrheals prior to procedure.   (She needs Berinert prior to procedures  due to hx of hereditary angioedema).    Recommendations:   - We discussed EGD/Colonoscopy with family but they would like to defer for now and will trial medications first  - Continue ATC loperamide. If after 24 hours, ongoing diffuse diarrhea, can add Tincture of Opium as standing (currently at PRN)  - reasonable to continue Rifaximin   - DC Senna   - Please discuss with ID given her history of norovirus and CMV viremia and whether repeat endoscopy would change course of CMV treatment   - PPI one time daily for reflux symptoms reported  - Please record BM count to help [place] accurately quantify diarrhea burden    This case was discussed with service attending Dr. Max Fickle  on 04/27/22. Thank you for involving [place] in the care of Ariel Braun.  We will continue to follow with you. Please don't hesitate to page [phone] with any questions or concerns.    Leonard Schwartz, [facility] Fellow

## 2022-04-27 NOTE — Consults (Signed)
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  SLP consult for liberalization to diet.  Other history: Gtube iso FTT.  Per diet, current diet (liquidized moderately thick diet) possible carryover from OSH. Previous noted coughing with thins, water in particular.  Patient Status  Positioning: Upright  Cognition: Lethargic  Communication: Functional for expression of basic wants, needs, and thoughts  Respiratory: Stable       Oral-Peripheral Examination  Oral-Peripheral Examination  Lips: Unremarkable/WNL  Mandible: Unremarkable/WNL  Tongue: Unremarkable/WNL  Palate: Elevates normally on phonation  Oral status: Mucosal dryness;Scattered/missing teeth  Comment(s): some missing dentition, marked xerostomia.    Swallow Evaluation     Level 0 (Thin)  Administered by: Cup  Oral Phase: No gross oral phase deficits  Pharyngeal Phase: No gross pharyngeal phase deficits           Level 4 (Puree)  Administered by: Spoon;Self-fed  Oral Phase: No gross oral phase deficits  Pharyngeal Phase: No gross pharyngeal phase deficits           Mixed  Administered by: Self-fed  Oral Phase: No gross oral phase deficits  Pharyngeal Phase: No gross pharyngeal phase deficits    Assessment  Required encouragement to sit up in bed.  Endorsed intermittent belly pain.  Oromotor exam notable for marked xerostomia and dried, flaky phlegm crusting oral, palatal, dental and lingual surfaces. Coughing occurs post perceived timely swallows of water, no coughing occurs with almond milk.  Multiple swallows per single bolus of purees, expectorates graham cracker, stating it was too dry, better tolerated with embedding of cracker in puree.  Endorsed desire to sit back in bed.    Impressions and Recommendations:  Suspect at least mild oropharyngeal dysphagia due to deconditioning and possible rheologic impact (due to  xerostomia).  Recommend advancement to easy to chew diet with careful 1:1 feeding, encourage upright positioning, moist foods only.  Spoke with RD and team.  We will follow.  Current Functional Level  Domain: Swallowing  Swallowing: <Level 6>  Within functional limits/modified independence         Swallowing Recommendations  Solids: Easy to chew  Liquids: Level 0 (Thin)  Liquid administration: Cup  Medication administration: Crushed with puree, as able  Supervision during meals: 1:1 assistance  Mealtime strategies and positioning: Upright;Remain upright 30 minutes after eating;Alternate solids and liquids;Small bites;Small single sips;Slow pace  Meal strategy comment(s): encourage PO intake, self feeding                Plan  Plan  Plan: See listed goals  Frequency  Frequency: 2x/week  Short Term Goals  Short Term Goals: The patient will safely and efficiently ingest 100% of therapeutic trials to determine appropriate diet, at the discretion of the SLP;The patient/family/caregiver will identify and demonstrate compensatory swallow strategies and/or precautions to improve safe and efficient swallowing with 100% accuracy  Long Term Goals  Long Term Goals: Increase patient/family/caregiver education;Improve safe oral intake and/or maintain/improve swallow function to prevent negative outcomes           Pedro Earls, SLP  04/27/2022

## 2022-04-27 NOTE — Plan of Care (Signed)
Problem: Discharge Planning - Adult  Goal: Knowledge of and participation in plan of care  Outcome: Progress within 12 hours     Problem: Fall, at Risk or Actual - Adult  Goal: Absence of falls and fall related injury  Outcome: Progress within 12 hours     Problem: Delirium - Adult / Pediatric  Goal: Absence or resolution of delirium  Outcome: Progress within 12 hours     Problem: Nutrition, Alteration in -Adult  Goal: Adequate nutritional intake  Outcome: Progress within 12 hours  Goal: Maximize nutritional intake per patient condition  Outcome: Progress within 12 hours     Problem: Pressure Injury,At Risk and Actual- Adult  Goal: Absence of any new pressure injury  Outcome: Progress within 12 hours  Goal: Pressure injury healing/stabilization  Outcome: Progress within 12 hours  Goal: Participation in preventative efforts and treatment plan (Patient/Family/Caregiver)  Outcome: Progress within 12 hours  Goal: Adequate nutritional intake  Outcome: Progress within 12 hours

## 2022-04-27 NOTE — Progress Notes (Signed)
[facility] PROGRESS NOTE  ATTENDING ONLY     My date of service is 04/27/22    24 Hour Course  -Afebrile, HDS  -Diarrhea continues, now on ATC loperamide w/ tincture of opium PRN  -c/s GI, would offer upper/lower scopes, family considering  -IVIG 25g given today  -Cleared for easy to chew diet by SLP, tube feeding adjusted by nutrition    Subjective  Met with patient, family at bedside. Diarrhea continues today, Doristine also reports cramping and abdominal discomfort. Also has some indigestion, usually takes Pepto-Bismol at home.Trying to spend some more time sitting up. No fevers or chills.    Vitals  Temp:  [36.7 C (98.1 F)-37.2 C (99 F)] 36.7 C (98.1 F)  Heart Rate:  [98-112] 101  *Resp:  [16-18] 18  BP: (109-119)/(77-87) 115/85  SpO2:  [96 %-100 %] 98 %    MostRecent Weight: 66.2 kg (146 lb)  Admission Weight: 66.2 kg (146 lb)      Intake/Output Summary (Last 24 hours) at 04/27/2022 1157  Last data filed at 04/27/2022 1610  Gross per 24 hour   Intake 640 ml   Output 350 ml   Net 290 ml         Pain Score: Eyes closed, patient calm    Physical Exam  Gen: NAD, alert and oriented, distressed by diarrhea  HEENT: EOMI, MMM  Resp: CTAB, no crackles/rhonchi/wheezing  Card: RRR, no murmurs  Gi: soft, nontender, distended, + bs   Ext: negative BLE edema   Skin: Ecchymosis to BL UE  Neuro: no focal deficits  Lines: PIV x1 in place, clean dry & intact dressing    [place] Performance Status: 40, Disabled; requires special care and assistance (ECOG equivalent 3)    Scheduled Meds:   0.9% sodium chloride flush  3 mL Intravenous Q12H [place]    cholecalciferol (vitamin D3)  2,000 Units Per G Tube Daily [place]    ergocalciferol (vitamin D2)  50,000 Units Per G Tube Q7 Days    folic acid  1 mg Oral Daily [place]    immune globulin (human)  20 g Intravenous Once    And    immune globulin (human)  5 g Intravenous Once    letermovir (PREVYMIS) IVPB  480 mg Intravenous Daily [place]    lidocaine (PF)  10 mL Subcutaneous  Once    loperamide  4 mg Feeding Tube Q6H SCH    mirtazapine  15 mg Per G Tube Daily At Bedtime [facility]    multivitamin  1 tablet Per G Tube Daily [facility]    rifAXIMin  550 mg Feeding Tube BID SCH    senna  17.2 mg Per G Tube Daily At Bedtime [facility]    thiamine mononitrate (vit B1)  100 mg Per G Tube Daily [place]    ursodiol  600 mg Per G Tube BID [facility]    zinc acetate  25 mg of elemental zinc (Zn) Per G Tube BID [place]     Continuous Infusions:  PRN Meds:   0.9% sodium chloride flush  3 mL Intravenous PRN    acetaminophen  650 mg Oral Q8H PRN    albuterol  2.5 mg Nebulization Q4H PRN    Or    albuterol  2.5 mg Nebulization Q4H PRN    aluminum-magnesium hydroxide-simethicone  15 mL Oral Q4H PRN    dexamethasone  4 mg Intravenous Once PRN    diphenhydrAMINE  50 mg Intravenous Once PRN    EPINEPHrine  0.3 mg Intramuscular Once PRN    HYDROcodone-acetaminophen  1 tablet Oral Q6H PRN    hydrOXYzine  25 mg Feeding Tube Q4H PRN    LORazepam  1 mg Feeding Tube Once PRN    ondansetron  4 mg Oral Q6H PRN    Or    ondansetron  4 mg Intravenous Q6H PRN    opium  6 mg Oral 4x Daily PRN    prochlorperazine  10 mg Intravenous Q8H PRN    Or    prochlorperazine  10 mg Per G Tube Q8H PRN       Data    CBC        04/27/22  0650 04/26/22  0619   WBC 5.1 4.5   HGB 8.6* 8.5*   HCT 28.5* 27.9*   PLT 106* 99*       Coags  No results found in last 36 hours  Chem7        04/27/22  0650 04/26/22  0619   NA 142 140   K 3.6 3.4*   CL 117* 115*   CO2 21* 18*   BUN 34* 32*   CREAT 0.48* 0.50*   GLU 108 131       Electrolytes        04/27/22  0650 04/26/22  0619   CA 8.2* 8.1*   MG 2.1 2.1   PO4 2.7 3.1       Liver Panel        04/27/22  0650 04/26/22  0619   AST 57* 66*   ALT 28 30   ALKP 194* 219*   TBILI 0.8 0.9         Microbiology Results (last 72 hours)       Procedure Component Value Units Date/Time    Peripheral Blood Culture [[id number]] Collected: 04/23/22 0224    Order Status: Completed Specimen: Peripheral Blood Updated: 04/27/22 8527     Peripheral  Blood Culture No growth 4 days.    Peripheral Blood Culture [[id number]] Collected: 04/23/22 0224    Order Status: Completed Specimen: Peripheral Blood Updated: 04/27/22 1443     Peripheral Blood Culture No growth 4 days.    GI Viral Panel PCR [[id number]] Collected: 04/23/22 0427    Order Status: Completed Specimen: Stool in [place] Updated: 04/26/22 1357     Comments ADD ON TEST     Adenovirus 40/41 Not detected     Norovirus GI/GII Not detected     Rotavirus A Not detected     Comments Reference value for all analytes: Not Detected.    GI Parasite Panel PCR [[id number]] Collected: 04/23/22 0427    Order Status: Completed Specimen: Stool in [place] Updated: 04/26/22 1357     Comments ADD ON TEST     Cryptosporidium Not detected     Entamoeba histolytica Not detected     Giardia Not detected     Comments Reference value for all analytes: Not Detected.    GI Bacteria Panel PCR [[id number]] Collected: 04/23/22 0427    Order Status: Completed Specimen: Stool in [place] Updated: 04/26/22 1357     Campylobacter Not detected     E. coli O157 Not detected     ETEC LT/ST Not detected     Salmonella Not detected     Shigella Not detected     STEC stx1/stx2 Not detected     Vibrio cholera Not detected     Comments Reference value for all analytes: Not  Detected.    Urine Culture [[id number]]  (Abnormal) Collected: 04/24/22 0843    Order Status: Completed Specimen: Not Applicable from Urine, [place] Updated: 04/26/22 1218     Bacterial Culture, Urine w/o gram stain 100,000  Enterococcus spp  (Susceptibility testing not performed as organisms generally respond to ampicillin, amoxicillin, nitrofurantoin, or doxycycline for uncomplicated cystitis.)        100,000 Candida albicans    Bacterial Culture, Normally Sterile Sites, With Gram Stain [[id number]] Collected: 04/23/22 0028    Order Status: Completed Specimen: Not Applicable from Peritoneal Fluid Updated: 04/26/22 0833     Gram Stain No PMNs seen , Rare Host cells (not  PMNs, RBCs, or squamous epithelial cells) , Few RBCs      No organisms seen     Bacterial Culture, Sterile Sites No growth 3 days.    COVID-19 RNA, RT-PCR/Nucleic Acid Amplification Screening (Asymptomatic and No Specific COVID-19 Suspicion); No [[id number]]     Order Status: Sent Specimen: Not Applicable from Bilateral Anterior Nares +/- OP Swab             Radiology Results  No results found.    I spoke with Elliot Dally. Emilia Beck, MD from [facility] regarding A/P.    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   June 2020 due to progression of M spike Revlimid was increased to 15 mg PO D1-21 every 28 days, plus dexamethasone  July 2021: started daratumumab, Pomylast 2 mg D 1-21, Q28 days, dexamethasone 12 mg PO Q weekly.   Primary oncologist: Dr. Iona Coach; Dr. Kennon Rounds [place] [facility]  Receiving dexamethasone 12 mg weekly for diarrhea as outpt  Receiving MT with daratumumab with pomalyst and dex, most recent dose 10/16/21, has since been on hold due to worsening diarrhea.      DATA:   09/07/2010: IgG Kappa MM  12/02/2021: Ig M <5, IgG 447, IgA 20, Ig E <2, SFLC Kappa 16.6, Lambda 1.6, Kappa/Lamda 10.38  12/14/21: IgG K on IFE, IgG 430, KLC 16.6, LLC 2.7, KLR 6.15  02/23/22 SPEP with stable M-spike, SFLC decreased Kappa 44.2 / Lambda 5.5 = K/L ratio = 8.04 (overall downtrending still)  04/22/22 MM labs: m-spike 0.6, IgG K on IFE, IgG 997, [place] 70.7 / Lambda 19.8 = K/L ratio = 3.57     Chemo:TBD, currently off therapy due to recurrent infections  IV Access: PIV  Supportive care: transfuse for Hg <7 and Platelets <10     Primary oncologist: Dr. 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     First positive for norovirus in 08/2021, continued to have diarrhea and persistent norovirus. Received 2x 7-day course of nitazoxanide (02/16-02/22/23 & 01/2022), but norovirus continued to be positive (most recently on 04/07/22). As of 6/9, norovirus now negative.     Regarding CMV colitis, 12/17/21 flex sig showed CMV colitis, sp multiple cycles  of gancyclovir IV, IVIG, valcyte 900 mg PO BID, then ultimately transitioned to letermovir, which she continues. Her CMV (iu/mL) 443,961--> 179,555--> 74 (2/22) --> 176 (3/1) --> 105 (6/9)     At home was taking loperamide BID and diarrhea was controlled to 2-3 times daily. Diarrhea had improved to 1 stool on 6/11, but worsened on 6/12 (11 stools). Possibly r/t to inadequate dosing of loperamdie vs infectious etiology. GI c/s 6/13 for possible scope, will also plan to c/s ID on 6/14.     Data:  6/9: C. Diff neg, GI bacterial/viral/parasite panels neg, norovirus neg    Plan  - Continue letermovir 432m IV daily  - c/s GI on 6/13 for consideration of repeat scope other treatment modalities (e.g. fecal transplantation), appreciate recs:  -offered upper/lower scopes with gentle prep via feeding tube, family considering this  -if no improvement in diarrhea after 24h of ATC loperamide, schedule tincture of opium ATC  - Continue loperamide 434mq6h ATC  - tincture of opium 87m60mour times daily PRN   [ ]  consult ID [email or web address] for possible further diagnostics/CMV tx planning      # Hx of acute liver failure, now requiring weekly paracentesis  # Hx of pericentral/sinusioidal and periportal fibrosis, ductal reaction with focal pericholangitis from vanishing bile duct in the setting of pomalidomide.   # Hx of Hepatic Encephalopathy  #  intermittent hypotension, likely 2/2 underlying liver disease  Diagnostic paracentesis negative for SBP, currently declining therapeutic para and abdomen soft. Malnutrition may also be contributing to ascites (see below).   -Continue Ursodiol 600 mg PO BID  -Continue rifaximin 550 mg PO QD  -Low sodium diet, 2 L fluid restriction  -Continue midodrine 10 mg PO TID  -Continue folic acid 1 mg PO one time daily  -Continue thiamine 100 mg PO one time daily  -continue MVI once daily      # Failure to thrive  # Decreased PO intake with PEG tube dependence  PEG tube placed during last admission,   -Continue TF - Nutren 2.0 - start 37m49m then advance by 20ml59mq8h until at goal 50ml/101m-continue marinol 15 mg PO one time daily  -appreciate SLP recs:   -easy to chew diet  -IV Zofran Q6hr PRN     # Immunocompromised  Immunocompromised due to underlying malignancy. Not currently neutropenic. UCx on 6/10 w/ 100,000CFU enterococcus & C. albicans. Not currently having any sx of dysuria, previous UCx w/ C. albicans. Will defer treatment as asymptomatic sample likely contaminated w/ skin flora.     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)    - Thrombocytopenia (present on admission)    - Coagulopathy (present on admission)    - Malignancy associated fatigue on admission  - Neoplasm/malignancy associated pain  - Hypoalbuminemia (present on admission)  - HYPOnatremia (present on admission)  - Fluid status: Volume overload on admission  - Cachexia    Code Status: FULL    Malyah Ohlrich Evangeline GulaHematology-Oncology BMT/CT  Pager: [phone]: [phone]: [phone]  1:57 PM     I spent a total time of 50 minutes in this episode of care preparing to  see  the patient, obtaining and/or reviewing separately obtained history, performing a medically appropriate examination and/or evaluation, counseling and educating patient/family/caregiver, ordering and reviewing medications, tests, or procedures, referring and communicating with other health care professionals, documenting clinical information in the electronic or other health record, and communicating results to the patient/family/caregiver, and/or other care coordination.

## 2022-04-27 NOTE — Plan of Care (Signed)
Problem: Discharge Planning - Adult  Goal: Knowledge of and participation in plan of care  Outcome: Progress within 12 hours     Problem: Fall, at Risk or Actual - Adult  Goal: Absence of falls and fall related injury  Outcome: Progress within 12 hours     Problem: Delirium - Adult / Pediatric  Goal: Absence or resolution of delirium  Outcome: Progress within 12 hours

## 2022-04-28 LAB — PHOSPHORUS, SERUM / PLASMA: Phosphorus, Serum / Plasma: 2.8 mg/dL (ref 2.3–4.7)

## 2022-04-28 LAB — VITAMIN B12: Vitamin B12: 242 ng/L — ABNORMAL LOW (ref 301–816)

## 2022-04-28 LAB — BASIC METABOLIC PANEL (NA, K, CL, CO2, BUN, CR, GLU, CA)
Anion Gap: 5 (ref 4–14)
Calcium, total, Serum / Plasma: 8.1 mg/dL — ABNORMAL LOW (ref 8.4–10.5)
Carbon Dioxide, Total: 21 mmol/L — ABNORMAL LOW (ref 22–29)
Chloride, Serum / Plasma: 118 mmol/L — ABNORMAL HIGH (ref 101–110)
Creatinine: 0.46 mg/dL — ABNORMAL LOW (ref 0.55–1.02)
Glucose, non-fasting: 105 mg/dL (ref 70–199)
Potassium, Serum / Plasma: 3.7 mmol/L (ref 3.5–5.0)
Sodium, Serum / Plasma: 144 mmol/L (ref 135–145)
Urea Nitrogen, Serum / Plasma: 37 mg/dL — ABNORMAL HIGH (ref 7–25)
eGFRcr: 108 mL/min/{1.73_m2} (ref >59)

## 2022-04-28 LAB — COMPLETE BLOOD COUNT WITH DIFFERENTIAL
Abs Basophils: 0.02 10*9/L (ref 0.00–0.10)
Abs Eosinophils: 0.03 10*9/L (ref 0.00–0.40)
Abs Imm Granulocytes: 0.14 10*9/L — ABNORMAL HIGH (ref <0.10)
Abs Lymphocytes: 0.68 10*9/L — ABNORMAL LOW (ref 1.00–3.40)
Abs Monocytes: 0.66 10*9/L (ref 0.20–0.80)
Abs Neutrophils: 2.79 10*9/L (ref 1.80–6.80)
Hematocrit: 25.6 % — ABNORMAL LOW (ref 36.0–46.0)
Hemoglobin: 8 g/dL — ABNORMAL LOW (ref 12.0–15.5)
MCH: 34.5 pg — ABNORMAL HIGH (ref 26.0–34.0)
MCHC: 31.3 g/dL (ref 31.0–36.0)
MCV: 110 fL — ABNORMAL HIGH (ref 80–100)
MPV: 12.5 fL (ref 9.1–12.6)
Platelet Count: 96 10*9/L — ABNORMAL LOW (ref 140–450)
RBC Count: 2.32 10*12/L — ABNORMAL LOW (ref 4.00–5.20)
RDW-CV: 18.4 % — ABNORMAL HIGH (ref 11.7–14.4)
WBC Count: 4.3 10*9/L (ref 3.4–10.0)

## 2022-04-28 LAB — ALKALINE PHOSPHATASE: Alkaline Phosphatase: 184 U/L — ABNORMAL HIGH (ref 38–108)

## 2022-04-28 LAB — ASPARTATE TRANSAMINASE: AST: 57 U/L — ABNORMAL HIGH (ref 5–44)

## 2022-04-28 LAB — ALANINE TRANSAMINASE: Alanine transaminase: 26 U/L (ref 10–61)

## 2022-04-28 LAB — MAGNESIUM, SERUM / PLASMA: Magnesium, Serum / Plasma: 2 mg/dL (ref 1.6–2.6)

## 2022-04-28 LAB — BILIRUBIN, TOTAL: Bilirubin, Total: 0.8 mg/dL (ref 0.2–1.2)

## 2022-04-28 LAB — COVID-19 RNA, RT-PCR/NUCLEIC ACID AMPLIFICATION: COVID-19 RNA, RT-PCR/Nucleic Acid Amplification: NOT DETECTED

## 2022-04-28 LAB — GAMMA-GLUTAMYL TRANSPEPTIDASE: Gamma-Glutamyl Transpeptidase: 118 U/L — ABNORMAL HIGH (ref 8–59)

## 2022-04-28 MED ORDER — LANSOPRAZOLE 3 MG/ML ORAL SUSPENSION COMPOUNDING KIT
3 mg/mL | Freq: Every morning | ORAL | Status: AC
Start: 2022-04-28 — End: 2022-05-27
  Administered 2022-04-28 – 2022-05-27 (×28): via GASTROSTOMY

## 2022-04-28 MED ORDER — CYANOCOBALAMIN (VIT B-12) 1,000 MCG TABLET
1000 mcg | Freq: Every day | ORAL | Status: AC
Start: 2022-04-28 — End: 2022-05-01
  Administered 2022-04-28: 23:00:00 1000 ug via GASTROSTOMY
  Administered 2022-04-29 – 2022-05-01 (×2): via GASTROSTOMY

## 2022-04-28 MED FILL — LANSOPRAZOLE 3 MG/ML ORAL SUSPENSION COMPOUNDING KIT: 3 mg/mL | ORAL | Qty: 10

## 2022-04-28 MED FILL — URSODIOL 60 MG/ML ORAL SYRUP: 60 mg/mL | ORAL | Qty: 10

## 2022-04-28 MED FILL — FOLIC ACID 1 MG TABLET: 1 mg | ORAL | Qty: 1

## 2022-04-28 MED FILL — ONDANSETRON HCL (PF) 4 MG/2 ML INJECTION SOLUTION: 4 mg/2 mL | INTRAMUSCULAR | Qty: 2

## 2022-04-28 MED FILL — CHOLECALCIFEROL (VITAMIN D3) 25 MCG (1,000 UNIT) TABLET: 1000 UNITS | ORAL | Qty: 2

## 2022-04-28 MED FILL — ANTI-DIARRHEAL (LOPERAMIDE) 2 MG TABLET: 2 mg | ORAL | Qty: 2

## 2022-04-28 MED FILL — XIFAXAN 550 MG TABLET: 550 mg | ORAL | Qty: 1

## 2022-04-28 MED FILL — OPIUM TINCTURE 10 MG/ML (MORPHINE) ORAL: 10 mg/mL (morphine) | ORAL | Qty: 1

## 2022-04-28 MED FILL — MAG-AL PLUS 200 MG-200 MG-20 MG/5 ML ORAL SUSPENSION: 200-200-20 mg/5 mL | ORAL | Qty: 30

## 2022-04-28 MED FILL — MIRTAZAPINE 15 MG TABLET: 15 mg | ORAL | Qty: 1

## 2022-04-28 MED FILL — TAB-A-VITE 400 MCG TABLET: 400 mcg | ORAL | Qty: 1

## 2022-04-28 MED FILL — PREVYMIS 480 MG/24 ML INTRAVENOUS SOLUTION: 480 mg/24 mL | INTRAVENOUS | Qty: 24

## 2022-04-28 MED FILL — PROCHLORPERAZINE EDISYLATE 10 MG/2 ML (5 MG/ML) INJECTION SOLUTION: 10 mg/2 mL (5 mg/mL) | INTRAMUSCULAR | Qty: 2

## 2022-04-28 MED FILL — HYDROCODONE 5 MG-ACETAMINOPHEN 325 MG TABLET: 5-325 mg | ORAL | Qty: 1

## 2022-04-28 MED FILL — ZINC ACETATE 33 MG/ML ORAL SOLN (PEDI BCH-SF): 33 mg/mL (10 mg of elemental zinc/mL) | ORAL | Qty: 2.5

## 2022-04-28 MED FILL — THIAMINE MONONITRATE (VITAMIN B1) 100 MG TABLET: 100 mg | ORAL | Qty: 1

## 2022-04-28 MED FILL — VITAMIN B-12  1,000 MCG TABLET: 1000 mcg | ORAL | Qty: 1

## 2022-04-28 NOTE — Consults (Signed)
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 She required significant 2 PA to perform supine<>sit transfer with limited to no initation of task. Self-limiting behaviors contribute to need for physical assist. Emphasis of session to work on sitting balance, head and trunk control. functional endurance, and righting reactions. Patient able to tolerate 15 minutes seated at EOB, requiring mod/max A at trunk to maintain upright positioning. Patient presents with global deconditioning, decreased activity tolerance, reduced functional strength, which are limiting patient's ability to perform functional mobility without significant assist. She will benefit from ongoing PT intervention to maximize patient's overall functional mobility.    Focus next session: Harrel Lemon to chair    RECOMMENDATIONS  DISCHARGE RECOMMENDATION  Comments Home with home PT  will need 24/7 CG support   Patient Current Functional Status Sufficient for PT Discharge Recommendation No   Discharge DME needs hospital bed, [facility] lift, wheelchair   Discharge transportation needs [facility] Assistive Device Recommendation Vertical dependent lift;Ceiling lift;Lateral transfer device   Activity Recommendation turn every 2 hours, bed in chair position     SUBJECTIVE  Subjective report:  Max encouragement to participate. "I just want to relax"  Notable observations:  Left lying in semi-fowler position all needs in reach. RN informed    SYSTEMS REVIEW  Cognition/Communication  Delirium screen:  Yes Delirium Screen:   Details:  at risk d/t immobility    Communication  Cognition/communication impaired:  Yes        Behavior: Self-limiting    Integumentary  Integumentary deficits:  YesIntegumentary deficit  detail:  PIV, PEG    Cardiopulmonary  Cardiopulmonary deficits:  Yes  Detail:  decreased activity tolerance    Musculoskeletal  Musculoskeletal deficits:  Yes  Abnormal strength findings: grossly at least 3/5    Pain     Currently in pain: No          COMPREHENSIVE MOVEMENT ANALYSIS/TREATMENT  Precautions/WB status: Yes  Precautions and weight bearing status comments: Falls, delirium, skin, aspiration    Hemodynamic response:   Normal hemodynamic response: Yes  Comments: VSS on RA      Functional Mobility  Requires second person/additional health care providers: Yes  Type additional health care provider: OT    Bed mobility Current Initial    Supine < > Sit  Level of assist  Dependent assist;Two person assist  Dependent assist;Two person assist (04/28/22 1132)   Device  Bed rail;Increased time;Log roll  Bed rail;Increased time;Log roll (04/28/22 1132)   Intervention  Verbal cues;Tactile cues  Verbal cues;Tactile cues (04/28/22 1132)   Supine<>sit comments:   cues for whole body sequencing, no initiation of task    Balance  Balance deficits noted: Yes  Functional Balance for ADLs  Position Static Dynamic   Sit Static sitting level of assist: Maximal assist-      -        Stand  -      -        Exercise/other interventions  Sitting exercises comment (other exercise, details): Static sitting at EOB, while OT focused on ADLs, PT emphasized sitting balance, head control, righting reactions, and functional endurance    Communication between other health care providers: Communication between other health care provider:  OT;RN  Communication comment: pt's functional status     Education Careers adviser: Patient  Content: Plan of care, Activity recommendations  Response: Needs reinforcement  Outcome measures   Physical Therapist Global Assessment of Mobility  Activity Achieved: Sitting edge of bed with any level of assist for > 5 minutes  AMPAC 6-clicks basic mobility score: 8      PLAN  Plan of care status:  Current plan  of care remains appropriate  PT frequency:  4x/week  PT duration:  4 weeks.    Planned PT interventions:   Specific interventions: Progressive functional mobility training;Balance training;NM Re-ed;Aerobic training;Ther ex  Education interventions: Fall risk reduction;Exercise program;Self-pacing/breathing;Benefits of activity;Caregiver training      Lorella Nimrod, PT    04/28/2022

## 2022-04-28 NOTE — Consults (Signed)
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 Pt continues to request to be left alone and sleep despite spendng entire day in bed. Pt requires mod A for trunk control and able to perform head control. Plan to focus next session on hoyer lift transfer to w/c vs high back chair. Pt p/w limited progress with functional indepdence in last 6 months and since previous admission p/w decreased strength due to prolonged bedrest and refusal of PT at [facility]. Recommend home w/ full time caregiver assist, hoyer, hospital bed, BSC and w/c vs placement.  Progress with OT: Slow progress  Current maximal level of assist: Dependent assist  Next session focus:      Recommendations    Recommended Discharge Disposition Home;Home Health OT   Discharge DME Recommendations     Equipment Recommendations Wheelchair;Commode   DME Comment hoyer and hospital bed   Discharge Recommendations Comment full time caregiver assist and DME   Rehab Potential Patient participates well in therapy and is progressing towards goal;Unable to tolerate 3 hours of therapy a day   Anticipated Assistance Available at Discharge Family;Spouse/Significant other   Anticipated Type of Assistance Available at Discharge     Anticipated Time of Assistance Available at Discharge Unknown at this time   Barriers to Discharge Medical issues;Insufficient support;Insufficient equipment   Patient's current functional ability appropriate for D/C recommendations No     Inpatient Recommendations  OT Inpatient Recommendations     Recommendation Comments Hoyer lift to high back chair   Current Maximal Level of Assist Needed Dependent assist     Brace/Precautions    Brace/Orthotic/Prosthetic:   Precautions:Has weight bearing limitation or precaution: Yes  Precautions and weight bearing status comments: Falls, delirium, skin, aspiration     Subjective Report:"I just want people to leave me alone"    Patient/Family Goal:     Objective Findings and Interventions    Areas of Occupation    Grooming and Light Hygiene Contact guard assistance   Cueing Required     Comment Pt brushed teeth with setup and handed toothbrush, combed hair, and washed face min cueing   Self Feeding     Cueing Required     Comment     Toileting Dependent   Cueing Required     Toileting Clothing Management Dependent   Cueing Required     Comment [place]   Upper Body Dressing     Cueing Required     Comment     Lower Body Dressing     Cueing Required     Comment     Upper Body Bathing     Cueing Required     Comment     Lower Body Bathing     Cueing Required     Comment     ADL/IADL Comment         Functional Transfers    Bed Mobility From Supine   Bed Mobility To Sitting EOB   Level of Assist Maximal assistance   Cueing Required     Transfer From Sit   Transfer To     Level of Assist     Cueing Required  Technique     Functional Transfer Comment Pt demonstrated -3/5 bilateral shoulder strength, 3/5 elbow and hand. Pt supine to sit w/ HOB elevated and 2p max A. Pt required mod A for static sitting balance due leaning to L. Pt tolerated sitting EOB for 15 min w/ mod A. Pt continues to request to lie back down and just wants to relax. Pt sit to supine 2p max A.         [place] for ADLs  6 Click Score: 11    Client Factors  Cognitive deficits noted in the following area(s):: Attention, Memory, Judgement/Safety, Orientation  Cognition comment:Oriented to [facility] given [facility]. Unable to recall which hospital.    Problem solving;Safety awareness;Information processing;Problem ID;Initiation;Organization;Sequencing;Self-regulation;Working Chief of Staff functions  comment:                    Roma Schanz, OT  04/28/2022 11:20 AM

## 2022-04-28 NOTE — Progress Notes (Addendum)
[facility] PROGRESS NOTE     24 Hour Course  -BM x 5 past 24 hours, BM x 2 overnight  -Patient declines endoscopy at this time - favoring medical interventions with plan to re-assess if worsening symptoms  -B12 - 242 - borderline low  -CMV stable 102 and norovirus undectable    Subjective  Patient reports she is doing ok today.  Continues to have abdominal cramping and diffuse pain.  Declines allowing for full exam, but reports her symptoms.  Reports possible improvement in loose stools.      Vitals  Temp:  [36.4 C (97.5 F)-37.3 C (99.1 F)] 37.1 C (98.8 F)  Heart Rate:  [93-106] 99  *Resp:  [16-18] 16  BP: (104-125)/(71-85) 117/82  SpO2:  [98 %-100 %] 100 %    Most Recent Weight: 66.2 kg (146 lb)  Admission Weight: 66.2 kg (146 lb)      Intake/Output Summary (Last 24 hours) at 04/28/2022 1356  Last data filed at 04/28/2022 0932  Gross per 24 hour   Intake 1410 ml   Output 0 ml   Net 1410 ml       Pain Score: Eyes closed, patient calm    Physical Exam  Gen: NAD, alert and oriented x 4 distressed by diarrhea  Delayed mentation, flat affect  HEENT: EOMI, MMM  Resp: CTAB, no crackles/rhonchi/wheezing  Card: RRR, no murmurs  Gi: soft, nontender, distended, + bs   Ext: negative BLE edema   Skin: Ecchymosis to BL UE  Neuro: no focal deficits  Lines: PIV x1 in place, clean dry & intact dressing    [place] Performance Status: 40, Disabled; requires special care and assistance (ECOG equivalent 3)    Scheduled Meds:   0.9% sodium chloride flush  3 mL Intravenous Q12H [place]    cholecalciferol (vitamin D3)  2,000 Units Per G Tube Daily [place]    ergocalciferol (vitamin D2)  50,000 Units Per G Tube Q7 Days    folic acid  1 mg Oral Daily [place]    lansoprazole  30 mg Per G Tube Q AM Before Breakfast [place]    letermovir (PREVYMIS) IVPB  480 mg Intravenous Daily [place]    lidocaine (PF)  10 mL Subcutaneous Once    loperamide  4 mg Feeding Tube Q6H SCH    mirtazapine  15 mg Per G Tube Daily At Bedtime [facility]    multivitamin  1 tablet  Per G Tube Daily [facility]    rifAXIMin  550 mg Feeding Tube BID SCH    thiamine mononitrate (vit B1)  100 mg Per G Tube Daily [place]    ursodiol  600 mg Per G Tube BID SCH    zinc acetate  25 mg of elemental zinc (Zn) Per G Tube BID SCH     Continuous Infusions:  PRN Meds:   0.9% sodium chloride flush  3 mL Intravenous PRN    acetaminophen  650 mg Oral Q8H PRN    albuterol  2.5 mg Nebulization Q4H PRN    Or    albuterol  2.5 mg Nebulization Q4H PRN    aluminum-magnesium hydroxide-simethicone  15 mL Oral Q4H PRN    dexamethasone  4 mg Intravenous Once PRN    diphenhydrAMINE  50 mg Intravenous Once PRN    EPINEPHrine  0.3 mg Intramuscular Once PRN    HYDROcodone-acetaminophen  1 tablet Oral Q6H PRN    hydrOXYzine  25 mg Feeding Tube Q4H PRN    LORazepam  1 mg  Feeding Tube Once PRN    ondansetron  4 mg Oral Q6H PRN    Or    ondansetron  4 mg Intravenous Q6H PRN    opium  6 mg Oral 4x Daily PRN    prochlorperazine  10 mg Intravenous Q8H PRN    Or    prochlorperazine  10 mg Per G Tube Q8H PRN       Data    CBC        04/28/22  0542 04/27/22  0650   WBC 4.3 5.1   HGB 8.0* 8.6*   HCT 25.6* 28.5*   PLT 96* 106*       Coags  No results found in last 36 hours  Chem7        04/28/22  0542 04/27/22  0650   NA 144 142   K 3.7 3.6   CL 118* 117*   CO2 21* 21*   BUN 37* 34*   CREAT 0.46* 0.48*   GLU 105 108       Electrolytes        04/28/22  0542 04/27/22  0650   CA 8.1* 8.2*   MG 2.0 2.1   PO4 2.8 2.7       Liver Panel        04/28/22  0542 04/27/22  0650   AST 57* 57*   ALT 26 28   ALKP 184* 194*   TBILI 0.8 0.8         Microbiology Results (last 72 hours)       Procedure Component Value Units Date/Time    Bacterial Culture, Normally Sterile Sites, With Gram Stain [[id number]] Collected: 04/23/22 0028    Order Status: Completed Specimen: Not Applicable from Peritoneal Fluid Updated: 04/28/22 1128     Gram Stain No PMNs seen , Rare Host cells (not PMNs, RBCs, or squamous epithelial cells) , Few RBCs      No organisms seen     Bacterial  Culture, Sterile Sites No growth 5 days.    COVID-19 RNA, RT-PCR/Nucleic Acid Amplification Screening (Asymptomatic and No Specific COVID-19 Suspicion); No [[id number]] Collected: 04/28/22 0359    Order Status: Sent Specimen: Not Applicable from Bilateral Anterior Nares +/- OP Swab Updated: 04/28/22 1123    Peripheral Blood Culture [[id number]] Collected: 04/23/22 0224    Order Status: Completed Specimen: Peripheral Blood Updated: 04/28/22 0315     Peripheral Blood Culture No growth 5 days.    Peripheral Blood Culture [[id number]] Collected: 04/23/22 0224    Order Status: Completed Specimen: Peripheral Blood Updated: 04/28/22 0315     Peripheral Blood Culture No growth 5 days.    GI Viral Panel PCR [[id number]] Collected: 04/23/22 0427    Order Status: Completed Specimen: Stool in [place] Updated: 04/26/22 1357     Comments ADD ON TEST     Adenovirus 40/41 Not detected     Norovirus GI/GII Not detected     Rotavirus A Not detected     Comments Reference value for all analytes: Not Detected.    GI Parasite Panel PCR [[id number]] Collected: 04/23/22 0427    Order Status: Completed Specimen: Stool in [place] Updated: 04/26/22 1357     Comments ADD ON TEST     Cryptosporidium Not detected     Entamoeba histolytica Not detected     Giardia Not detected     Comments Reference value for all analytes: Not Detected.    GI Bacteria Panel PCR [[id number]] Collected: 04/23/22 0427  Order Status: Completed Specimen: Stool in [place] Updated: 04/26/22 1357     Campylobacter Not detected     E. coli O157 Not detected     ETEC LT/ST Not detected     Salmonella Not detected     Shigella Not detected     STEC stx1/stx2 Not detected     Vibrio cholera Not detected     Comments Reference value for all analytes: Not Detected.    Urine Culture [[id number]]  (Abnormal) Collected: 04/24/22 0843    Order Status: Completed Specimen: Not Applicable from Urine, [place] Updated: 04/26/22 1218     Bacterial Culture, Urine w/o gram stain  100,000  Enterococcus spp  (Susceptibility testing not performed as organisms generally respond to ampicillin, amoxicillin, nitrofurantoin, or doxycycline for uncomplicated cystitis.)        100,000 Candida albicans          Radiology Results  No results found.    I spoke with Melanee Left, MD from [facility] regarding A/P.    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   Receiving dexamethasone 12 mg weekly for diarrhea as outpt  10/16/21 - last Daratumumab dose (on hold due to diarrhea)     DATA:   09/07/2010: IgG Kappa MM  12/02/2021: Ig M <5, IgG 447, IgA 20, Ig E <2, SFLC Kappa 16.6, Lambda 1.6, Kappa/Lamda 10.38  12/14/21: IgG K on IFE, IgG 430, KLC 16.6, LLC 2.7, KLR 6.15  02/23/22 SPEP with stable M-spike, SFLC decreased Kappa 44.2 / Lambda 5.5 = K/L ratio = 8.04 (overall downtrending still)  04/23/22 M-spike 0.6, IgG K on IFE, IgG 997, Kappa 70.7/Lambda 19.8 = K/L ratio = 3.57     Chemo:  TBD, currently off therapy due to recurrent infections    Supportive Care:   Transfuse for Hg <7 and Platelets <10  IV Access: PIV     Primary Oncologist:   Dr. Jacelyn Grip ([place])  Dr. Iona Coach ([place])    Disposition:  Lives in [place] - anticipate return to home with [facility]    # Diarrhea,  # c/b CMV Viremia  # Hx prolonged norovirus infection  -Extended hospitalization at both [place] 1/23-04/16/22, with hypovolemic shock associated with norovirus, acute liver failure associated with pomylast, CMV colitis.   08/2021 - first  +norovirus with persistent +norovirus s/p 7-day courses x 2 nitazoxanide (2/16-02/22/23 & 01/2022), but norovirus continued to be positive (most recently 04/07/22)- subsequent clearance of norovirus per test 6/9,   -Regarding CMV colitis, 12/17/21 flex sig showed CMV colitis, sp multiple cycles of gancyclovir IV, IVIG, valcyte 900 mg PO BID, then ultimately transitioned to letermovir, which she continues. Her CMV (iu/mL) 443,961--> 179,555--> 74 (2/22) --> 176 (3/1) --> 105 (6/9)  -At home was taking loperamide BID and diarrhea was controlled to 2-3 times daily. Diarrhea had improved to 1 stool on 6/11, but worsened on 6/12 (11 stools). Possibly r/t  to inadequate dosing of loperamdie vs infectious etiology. GI c/s 6/13 for possible scope - per patient and family declined endoscopy in favor of supportive medical interventions and to revisit endoscopy if diarrhea doesn't improve.    6/14 - Given CMV stable and norovirus negative and enteric panel unrevealing - favor non-infectious etiologies for diarrhea    Data:  6/9: C. Diff neg, GI bacterial/viral/parasite panels neg, norovirus neg    Plan  -Continue letermovir 480 mg IV daily  -Appreciate GI consultation - last seen 6/13  -Defer endoscopy upon discussion with patient and family - revisit if medical management ineffective  - Continue loperamide '4mg'$  q6h ATC  - Continue tincture of opium '6mg'$  QID PRN   - Defer ID consultation 6/14 in absence of new ID findings on enteric workup     # Acute liver failure, now requiring weekly paracentesis  # Pericentral/sinusioidal and periportal fibrosis, ductal reaction with focal pericholangitis from vanishing bile duct in the setting of pomalidomide.   # Hepatic Encephalopathy  Diagnostic paracentesis negative for SBP, currently declining therapeutic para and abdomen soft. Malnutrition may also be contributing to ascites (see below).   -Continue Ursodiol 600 mg PO BID  -Continue rifaximin 550 mg PO QD  -Continue midodrine 10 mg PO  TID  -Low sodium diet  -2L fluid restriction  -Defer scheduled paracentesis in favor of prn discomfort     # Failure to thrive  # PEG tube dependence  # Microcytic anemia - B12 deficiency  PEG tube placed during last admission,   -Continue TF - Nutren 1.5 - goal 25m/hr  -continue marinol 15 mg PO one time daily     # Immunocompromised  Immunocompromised due to underlying malignancy.   Not currently neutropenic. UCx 6/10 w/ 100,000CFU enterococcus & C. albicans. Not currently having any sx of dysuria, previous UCx w/ C. albicans. Will defer treatment as asymptomatic sample likely contaminated w/ skin flora.     # Physical Deconditioning  -PT/OT - rec placement     VTE PPx:  Contraindicated: Platelets <= 50 (or around 50)       Nutritional Assessment  Severe Chronic disease or condition related malnutrition    During this hospitalization the patient is also being treated for:  - Anemia associated with malignancy (present on admission)    - Thrombocytopenia (present on admission)    - Coagulopathy (present on admission)    - Malignancy associated fatigue on admission  - Neoplasm/malignancy associated pain  - Hypoalbuminemia (present on admission)  - HYPOnatremia (present on admission)  - Fluid status: Volume overload on admission  - Cachexia    Code Status: FULL    CGardiner Rhyme NP  Pager:  [phone] Phone:  4[facility] HospitalIdentification #:  [id number]   I spent a total time of 50 minutes in this episode of care preparing to see the patient, obtaining and/or reviewing separately obtained history, performing a medically appropriate examination and/or evaluation, counseling and educating patient/family/caregiver, ordering and reviewing medications, tests, or procedures, referring and communicating with other health care professionals, documenting clinical information in the electronic or other health record, and communicating results to the patient/family/caregiver, and/or other care coordination.

## 2022-04-28 NOTE — Progress Notes (Signed)
Office of [place] Encounter    [place] reviewed chart for [place] laboratory result(s) ordered by [place] provider.    Patient has standing/serial order for CMV DNA, Quantitative PCR. Patient followed by the  BMT/HEM  team. PCP on record not notified.    Maudie Mercury  [place]  Phone: [phone]  [email or web address] .[place]ennie Pierini

## 2022-04-28 NOTE — Progress Notes (Addendum)
Office of [place] Encounter    [place] reviewed chart for [place] Laboratory result(s) ordered by [facility] provider.    Provider/team [name], NP  is aware and addressed, as evidenced by the progress note on 04/27/22. PCP on record not notified.    Accession number(s): [id number]    Ariel Braun  [place]  Phone: [phone]  [email or web address] .[place]ennie Pierini

## 2022-04-29 LAB — BILIRUBIN, TOTAL: Bilirubin, Total: 0.7 mg/dL (ref 0.2–1.2)

## 2022-04-29 LAB — COMPLETE BLOOD COUNT WITH DIFFERENTIAL
Abs Basophils: 0.02 10*9/L (ref 0.00–0.10)
Abs Eosinophils: 0.02 10*9/L (ref 0.00–0.40)
Abs Imm Granulocytes: 0.17 10*9/L — ABNORMAL HIGH (ref <0.10)
Abs Lymphocytes: 0.86 10*9/L — ABNORMAL LOW (ref 1.00–3.40)
Abs Monocytes: 0.52 10*9/L (ref 0.20–0.80)
Abs Neutrophils: 2.87 10*9/L (ref 1.80–6.80)
Hematocrit: 24.6 % — ABNORMAL LOW (ref 36.0–46.0)
Hemoglobin: 7.5 g/dL — ABNORMAL LOW (ref 12.0–15.5)
MCH: 34.6 pg — ABNORMAL HIGH (ref 26.0–34.0)
MCHC: 30.5 g/dL — ABNORMAL LOW (ref 31.0–36.0)
MCV: 113 fL — ABNORMAL HIGH (ref 80–100)
MPV: 12.1 fL (ref 9.1–12.6)
Platelet Count: 106 10*9/L — ABNORMAL LOW (ref 140–450)
RBC Count: 2.17 10*12/L — ABNORMAL LOW (ref 4.00–5.20)
RDW-CV: 18.4 % — ABNORMAL HIGH (ref 11.7–14.4)
WBC Count: 4.5 10*9/L (ref 3.4–10.0)

## 2022-04-29 LAB — PHOSPHORUS, SERUM / PLASMA: Phosphorus, Serum / Plasma: 3 mg/dL (ref 2.3–4.7)

## 2022-04-29 LAB — BASIC METABOLIC PANEL (NA, K, CL, CO2, BUN, CR, GLU, CA)
Anion Gap: 8 (ref 4–14)
Calcium, total, Serum / Plasma: 7.9 mg/dL — ABNORMAL LOW (ref 8.4–10.5)
Carbon Dioxide, Total: 18 mmol/L — ABNORMAL LOW (ref 22–29)
Chloride, Serum / Plasma: 117 mmol/L — ABNORMAL HIGH (ref 101–110)
Creatinine: 0.4 mg/dL — ABNORMAL LOW (ref 0.55–1.02)
Glucose, non-fasting: 105 mg/dL (ref 70–199)
Potassium, Serum / Plasma: 4.3 mmol/L (ref 3.5–5.0)
Sodium, Serum / Plasma: 143 mmol/L (ref 135–145)
Urea Nitrogen, Serum / Plasma: 35 mg/dL — ABNORMAL HIGH (ref 7–25)
eGFRcr: 112 mL/min/{1.73_m2} (ref >59)

## 2022-04-29 LAB — GAMMA-GLUTAMYL TRANSPEPTIDASE: Gamma-Glutamyl Transpeptidase: 118 U/L — ABNORMAL HIGH (ref 8–59)

## 2022-04-29 LAB — FOLATE: Folate, serum: 12.5 ng/mL (ref >4)

## 2022-04-29 LAB — PERIPHERAL BLOOD CULTURE
Peripheral Blood Culture: NO GROWTH
Peripheral Blood Culture: NO GROWTH

## 2022-04-29 LAB — ALANINE TRANSAMINASE: Alanine transaminase: 24 U/L (ref 10–61)

## 2022-04-29 LAB — ALKALINE PHOSPHATASE: Alkaline Phosphatase: 233 U/L — ABNORMAL HIGH (ref 38–108)

## 2022-04-29 LAB — AMMONIA: Ammonia: 32 umol/L (ref 18–72)

## 2022-04-29 LAB — ASPARTATE TRANSAMINASE: AST: 62 U/L — ABNORMAL HIGH (ref 5–44)

## 2022-04-29 LAB — MAGNESIUM, SERUM / PLASMA: Magnesium, Serum / Plasma: 1.8 mg/dL (ref 1.6–2.6)

## 2022-04-29 MED ORDER — PROCHLORPERAZINE MALEATE 5 MG TABLET
5 | Freq: Three times a day (TID) | ORAL | Status: DC | PRN
Start: 2022-04-29 — End: 2022-05-27
  Administered 2022-05-12: 15:00:00 5 mg via GASTROSTOMY
  Administered 2022-05-19: 01:00:00 via GASTROSTOMY

## 2022-04-29 MED ORDER — PROCHLORPERAZINE EDISYLATE 10 MG/2 ML (5 MG/ML) INJECTION SOLUTION
10 | Freq: Three times a day (TID) | INTRAMUSCULAR | Status: DC | PRN
Start: 2022-04-29 — End: 2022-05-27
  Administered 2022-04-29 – 2022-05-06 (×7): via INTRAVENOUS
  Administered 2022-05-07: 17:00:00 5 mg via INTRAVENOUS
  Administered 2022-05-08 – 2022-05-10 (×2): via INTRAVENOUS
  Administered 2022-05-17: 12:00:00 5 mg via INTRAVENOUS

## 2022-04-29 MED FILL — THIAMINE MONONITRATE (VITAMIN B1) 100 MG TABLET: 100 mg | ORAL | Qty: 1

## 2022-04-29 MED FILL — ANTI-DIARRHEAL (LOPERAMIDE) 2 MG TABLET: 2 mg | ORAL | Qty: 2

## 2022-04-29 MED FILL — TAB-A-VITE 400 MCG TABLET: 400 mcg | ORAL | Qty: 1

## 2022-04-29 MED FILL — LANSOPRAZOLE 3 MG/ML ORAL SUSPENSION COMPOUNDING KIT: 3 mg/mL | ORAL | Qty: 10

## 2022-04-29 MED FILL — ZINC ACETATE 33 MG/ML ORAL SOLN (PEDI BCH-SF): 33 mg/mL (10 mg of elemental zinc/mL) | ORAL | Qty: 2.5

## 2022-04-29 MED FILL — XIFAXAN 550 MG TABLET: 550 mg | ORAL | Qty: 1

## 2022-04-29 MED FILL — MIRTAZAPINE 15 MG TABLET: 15 mg | ORAL | Qty: 1

## 2022-04-29 MED FILL — URSODIOL (BULK) 100 % POWDER: 100 % | Qty: 0.6

## 2022-04-29 MED FILL — ZINC ACETATE DIHYDRATE (BULK) 100 % CRYSTALS: 100 % | Qty: 0.08

## 2022-04-29 MED FILL — ACETAMINOPHEN 325 MG TABLET: 325 mg | ORAL | Qty: 2

## 2022-04-29 MED FILL — FOLIC ACID 1 MG TABLET: 1 mg | ORAL | Qty: 1

## 2022-04-29 MED FILL — VITAMIN B-12  1,000 MCG TABLET: 1000 mcg | ORAL | Qty: 1

## 2022-04-29 MED FILL — CHOLECALCIFEROL (VITAMIN D3) 25 MCG (1,000 UNIT) TABLET: 1000 UNITS | ORAL | Qty: 2

## 2022-04-29 MED FILL — PROCHLORPERAZINE EDISYLATE 10 MG/2 ML (5 MG/ML) INJECTION SOLUTION: 10 mg/2 mL (5 mg/mL) | INTRAMUSCULAR | Qty: 2

## 2022-04-29 MED FILL — PREVYMIS 480 MG/24 ML INTRAVENOUS SOLUTION: 480 mg/24 mL | INTRAVENOUS | Qty: 24

## 2022-04-29 MED FILL — HYDROXYZINE HCL 25 MG TABLET: 25 mg | ORAL | Qty: 1

## 2022-04-29 NOTE — Consults (Signed)
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 Patient required total A bed>chair transfer with mechanical lift. Patient tolerates at least 30 minutes well-supported in chair with head/neck supported, VSS and patient asymptomatic t/o. OT led conversation regarding therapy goals. Patient expresses desire to progress functional OOB mobility in order to meaningfully engage in ADLs. Patient and patient's daughter were educated on trial of IP PT intervention with goals of possible placement with ongoing PT intervention. As a result, patient will need to actively participate. Patient continues to present with global deconditioning, decreased activity tolerance, reduced functional strength and will benefit from ongoing PT intervention to maximize patient's independence with functional mobility, reduce CG burden, improve patient's QOL.    Focus next session: Sitting tolerate EOB, trial STEDY    RECOMMENDATIONS  DISCHARGE RECOMMENDATION  Comments Placement with ongoing PT needs  pending medical optimization and progress while in house   Patient Current Functional Status Sufficient for PT Discharge Recommendation No   Discharge DME needs hospital bed, [facility] lift, wheelchair   [facility] Patient participates well in therapy and progressing towards goals     NURSING RECOMMENDATIONS  Inpatient Rehab Assistive Device Recommendation Vertical dependent lift;Ceiling lift;Lateral transfer device   Activity Recommendation Harrel Lemon to chair with fall precautions in place     SUBJECTIVE  Subjective report:  Wants to get out of bed  Notable observations:  Left sitting upright in chair with sling and fall precautions in place, OT in room. RN informed    SYSTEMS  REVIEW  Cognition/Communication  Delirium screen:  Yes Delirium Screen:   Details:  at risk d/t immobility    Communication  Cognition/communication impaired:  Yes        Behavior: Self-limiting    Integumentary  Integumentary deficits:  YesIntegumentary deficit detail:  PIV, PEG    Cardiopulmonary  Cardiopulmonary deficits:  Yes  Detail:  decreased activity tolerance    Musculoskeletal  Musculoskeletal deficits:  Yes  Abnormal strength findings: grossly at least 3/5    Pain     Currently in pain: No          COMPREHENSIVE MOVEMENT ANALYSIS/TREATMENT  Precautions/WB status: Yes  Precautions and weight bearing status comments: Falls, delirium, skin, aspiration, contact      Hemodynamic response:   Normal hemodynamic response: Yes  Comments: VSS on RA      Functional Mobility  Requires second person/additional health care providers: Yes  Type additional health care provider: OT    Transfer Current Initial   Sit < > Stand  Level of assist     Device     Intervention     Transfer  Level of assist  From: Bed  To: Chair  Dependent assist;Two person assist  Dependent assist;Two person assist (04/29/22 1209)   Device  Vertical dependent lift  Vertical dependent lift (04/29/22 1209)   Intervention         Balance  Balance deficits noted: Yes  Functional Balance for ADLs  Position Static Dynamic   Sit Static sitting level of assist: Contact guard assist-   Static sitting comment: Well supported in high back bedside chair, Posey belt donned, head/neck supported by pillow  -        Stand  -      -  Communication between other health care providers: Communication between other health care provider: OT;RN  Communication comment: goals of therapy discussion; pt's functional status     Education assessment  Learner: Patient, Caregiver  Content: Plan of care, Activity recommendations, Discharge recommendations  Response: Needs reinforcement  Outcome measures   Physical Therapist Global Assessment of Mobility  Activity  Achieved: Passive transfer to chair/commode  AMPAC 6-clicks basic mobility score: 8      PLAN  Plan of care status:  Current plan of care remains appropriate  PT frequency:  4x/week  PT duration:  4 weeks.      Planned PT interventions:   Specific interventions: Progressive functional mobility training;Balance training;NM Re-ed;Aerobic training;Ther ex  Education interventions: Fall risk reduction;Exercise program;Self-pacing/breathing;Benefits of activity;Caregiver training      Lorella Nimrod, PT    04/29/2022

## 2022-04-29 NOTE — Consults (Signed)
Treatment Note    Interval History     Lobbyist Needed?: (P) No (Simultaneous filing. User may not have seen previous data.)    Subjective  Subjective  Patient subjective: Pt in bed w/ adequate alertness and egnagement.  Daughter present and participating.  Pt agreeable to SLP services.  Daughter and Pt endorse Pt only consuming a few bites/sips a day w/ fair enjoyment at this time.         Objective  Short term goal(s) progress: F/u dysphagia mgmt..  Pt set-up for self-administration for PO trials of thin via cup/spoon, NTL via cup, puree, and hard solids.  Routine adequate acceptance w/ variable bolus hold noted, concern for diminished oropharyngeal coordination w/ thins > other consistencies leading to audible and multiple swallows (3-4) for thin f/b throat clear/coughing on ~ 50% of trials.  Spoon presentation appeared to eliminate audible swallow and cough w/ thins.  Reduced f/u swallows and w/o cough w/ advanced consistencies and NTL.  Prolonged mastication w/ hard solid w/ independent liquid wash to support oral clearance.  Extensive review of findings, aspiration concerns, and recommendation for MBSS w/ Pt/daughter stating understanding, asking clarifying questions, and requesting some time to consider [place].    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 Recommend to continue with easy to chew diet, 1:1 feeding, encourage upright positioning, moist foods encouraged, oral care prior to all PO.  Low threshold to hold if Pt unable to follow aforementioned guidelines.  Pt/family discussing w/ team GOC at this time.  If w/in GOC SLP recommending MBSS to objectively measure swallow physiology to best support diet recommendations and supportive strategies for reduce aspiration risk.  MD verbally consulted.  Will check in tomorrow to determine if MBSS  w/in GOC.  Current Functional Level  Domain: Swallowing  Swallowing: <Level 5>  Mild dysphagia: Distant supervision, may need one diet consistency restricted  Rehab Potential  Rehab potential judged to be: Good  Facilitators: Motivation;Family/community support;Insight into deficits  Barriers: Co-morbidities    Recommendations  Swallowing Recommendations  Solids: (P) Easy to chew  Liquids: (P) Level 0 (Thin)  Liquid administration: (P) Spoon;Cup;No straw  Medication administration: Non-oral  Supervision during meals: (P) 1:1 assistance  Mealtime strategies and positioning: Upright;Small bites;Small single sips;Slow pace;Remain upright 30 minutes after eating;Alternate solids and liquids  Meal strategy comment(s): encourage PO intake, self feeding; oral care prior to all PO                Plans and Goals  Plan: Continue per outlined plan of care (Awaiting GOC regarding MBSS (if family in agreement recommend MBSS))  Frequency: 2x/week    Discharge Recommendations: Placement for continued intensive therapy upon discharge with PT/OT input       Updated Short Term Goals    04/29/22 1442   Short Term Goals- Updated: The patient/family/caregiver will identify and demonstrate compensatory swallow strategies and/or precautions to improve safe and efficient swallowing with 100% accuracy       Abelardo Diesel, SLP  04/29/2022

## 2022-04-29 NOTE — Interdisciplinary (Addendum)
SOCIAL WORK NOTE    Patient is a 61 y.o. female with Multiple myeloma and immunoproliferative neoplasms (CMS code) who was referred to Social Work for supportive services.    Data:     Patient Information  Referral Source: Other (Comment), Medical Care Team (self)  Presenting Concerns: Psychosocial assessment, Emotional support  Mode of Interview: In-person  Source of Information: Patient, Family  Services/Teams: [facility] H[facility]ology    Assessment:     SW participated in MDR and treatment team requesting SW explore support. SW attempted to visit Pt for purpose of supportive services. Pt lying in bed with adult children at bedside. BSRN entered room and Pt excused SW to use bedpan. SW to attempt another visit at a later time.      ADDENDUM:   SW attempted to see Pt in afternoon. She was sound asleep. SW spoke with Pt's daughter, [name] who shared no psychosocial needs. She voiced previous concerns with SNF placement but hopeful treatment team will be able to find placement to support Pt in her recovery. SW provided contact information once again should they require social support.     Interventions/Plan:     Interventions      Flowsheet Row Most Recent Value   Interventions    Interventions Provided Psychosocial Assessment, Supportive counseling   Time Spent 15 min               Enid Baas, MPH  Clinical Social Worker   Inpatient Hematology/Oncology  Phone:  [phone]

## 2022-04-29 NOTE — Progress Notes (Signed)
[facility] PROGRESS NOTE     This is an independent service.   The available consultant for this service is Melanee Left, MD.              24 Hour Course  -Awaiting MMA and copper levels -  -BM x 2 overnight -     Subjective  Patient reports she is doing ok today.  Very slowed verbal responses with coaching and rephrasing questions  Denies abdominal pain today  Reports possible improvement in loose stools.    Generally weak - agreeable to therapy today - unable to articulate goals for discharge    Vitals  Temp:  [36.7 C (98.1 F)-37.5 C (99.5 F)] 36.7 C (98.1 F)  Heart Rate:  [97-105] 97  *Resp:  [16-18] 16  BP: (92-118)/(63-87) 118/87  SpO2:  [98 %-100 %] 100 %    Most Recent Weight: 66.2 kg (146 lb)  Admission Weight: 66.2 kg (146 lb)      Intake/Output Summary (Last 24 hours) at 04/29/2022 1211  Last data filed at 04/29/2022 0900  Gross per 24 hour   Intake 1150 ml   Output 200 ml   Net 950 ml       Pain Score: 0    Physical Exam  Gen: NAD, alert and oriented x 4, but slowed mentation and delayed verbal responses, flat affect  HEENT: EOMI, MMM  Resp: CTAB, no crackles/rhonchi/wheezing  Card: RRR, no murmurs  Gi: soft, nontender, distended, + bs   Ext: negative BLE edema   Skin: Ecchymosis to BUE  Neuro: no focal deficits  Lines: PIV x1 in place, clean dry & intact dressing    [place]sky Performance Status: 40, Disabled; requires special care and assistance (ECOG equivalent 3)    Scheduled Meds:   0.9% sodium chloride flush  3 mL Intravenous Q12H [place]    cholecalciferol (vitamin D3)  2,000 Units Per G Tube Daily [place]    cyanocobalamin (Vitamin B12)  1,000 mcg Per G Tube Daily [place]    ergocalciferol (vitamin D2)  50,000 Units Per G Tube Q7 Days    folic acid  1 mg Oral Daily [place]    lansoprazole  30 mg Per G Tube Q AM Before Breakfast [place]    letermovir (PREVYMIS) IVPB  480 mg Intravenous Daily [place]    loperamide  4 mg Feeding Tube Q6H SCH    mirtazapine  15 mg Per G Tube Daily At Bedtime [facility]    multivitamin   1 tablet Per G Tube Daily [facility]    rifAXIMin  550 mg Feeding Tube BID SCH    thiamine mononitrate (vit B1)  100 mg Per G Tube Daily [place]    ursodiol  600 mg Per G Tube BID SCH    zinc acetate  25 mg of elemental zinc (Zn) Per G Tube BID SCH     Continuous Infusions:  PRN Meds:   0.9% sodium chloride flush  3 mL Intravenous PRN    acetaminophen  650 mg Oral Q8H PRN    albuterol  2.5 mg Nebulization Q4H PRN    Or    albuterol  2.5 mg Nebulization Q4H PRN    aluminum-magnesium hydroxide-simethicone  15 mL Oral Q4H PRN    HYDROcodone-acetaminophen  1 tablet Oral Q6H PRN    hydrOXYzine  25 mg Feeding Tube Q4H PRN    ondansetron  4 mg Oral Q6H PRN    Or    ondansetron  4 mg Intravenous Q6H PRN  opium  6 mg Oral 4x Daily PRN    prochlorperazine  5 mg Intravenous Q8H PRN    Or    prochlorperazine  5 mg Per G Tube Q8H PRN     Data    CBC        04/29/22  0548 04/28/22  0542   WBC 4.5 4.3   HGB 7.5* 8.0*   HCT 24.6* 25.6*   PLT 106* 96*       Coags  No results found in last 36 hours  Chem7        04/29/22  0548 04/28/22  0542   NA 143 144   K 4.3 3.7   CL 117* 118*   CO2 18* 21*   BUN 35* 37*   CREAT 0.40* 0.46*   GLU 105 105       Electrolytes        04/29/22  0548 04/28/22  0542   CA 7.9* 8.1*   MG 1.8 2.0   PO4 3.0 2.8       Liver Panel        04/29/22  0548 04/28/22  0542   AST 62* 57*   ALT 24 26   ALKP 233* 184*   TBILI 0.7 0.8       Microbiology Results (last 72 hours)       Procedure Component Value Units Date/Time    Peripheral Blood Culture [[id number]] Collected: 04/23/22 0224    Order Status: Completed Specimen: Peripheral Blood Updated: 04/29/22 0548     Peripheral Blood Culture No growth 6 days.    Peripheral Blood Culture [[id number]] Collected: 04/23/22 0224    Order Status: Completed Specimen: Peripheral Blood Updated: 04/29/22 0548     Peripheral Blood Culture No growth 6 days.    COVID-19 RNA, RT-PCR/Nucleic Acid Amplification Screening (Asymptomatic and No Specific COVID-19 Suspicion); No [[id number]]  Collected: 04/28/22 0359    Order Status: Completed Specimen: Not Applicable from Bilateral Anterior Nares +/- OP Swab Updated: 04/28/22 1652     COVID-19 RNA, RT-PCR/Nucleic Acid Amplification Not detected     Comment: (Reported by Lab to Public Health.)  (Results reported to [facility])          Comments See Comment     Comment: Bilateral Anterior nares and OP Swab  in universal transport medium         Bacterial Culture, Normally Sterile Sites, With Gram Stain [[id number]] Collected: 04/23/22 0028    Order Status: Completed Specimen: Not Applicable from Peritoneal Fluid Updated: 04/28/22 1128     Gram Stain No PMNs seen , Rare Host cells (not PMNs, RBCs, or squamous epithelial cells) , Few RBCs      No organisms seen     Bacterial Culture, Sterile Sites No growth 5 days.    GI Viral Panel PCR [[id number]] Collected: 04/23/22 0427    Order Status: Completed Specimen: Stool in [place] Updated: 04/26/22 1357     Comments ADD ON TEST     Adenovirus 40/41 Not detected     Norovirus GI/GII Not detected     Rotavirus A Not detected     Comments Reference value for all analytes: Not Detected.    GI Parasite Panel PCR [[id number]] Collected: 04/23/22 0427    Order Status: Completed Specimen: Stool in [place] Updated: 04/26/22 1357     Comments ADD ON TEST     Cryptosporidium Not detected     Entamoeba histolytica Not detected     Giardia  Not detected     Comments Reference value for all analytes: Not Detected.    GI Bacteria Panel PCR [[id number]] Collected: 04/23/22 0427    Order Status: Completed Specimen: Stool in [place] Updated: 04/26/22 1357     Campylobacter Not detected     E. coli O157 Not detected     ETEC LT/ST Not detected     Salmonella Not detected     Shigella Not detected     STEC stx1/stx2 Not detected     Vibrio cholera Not detected     Comments Reference value for all analytes: Not Detected.    Urine Culture [[id number]]  (Abnormal) Collected: 04/24/22 0843    Order Status: Completed Specimen: Not  Applicable from Urine, [place] Updated: 04/26/22 1218     Bacterial Culture, Urine w/o gram stain 100,000  Enterococcus spp  (Susceptibility testing not performed as organisms generally respond to ampicillin, amoxicillin, nitrofurantoin, or doxycycline for uncomplicated cystitis.)        100,000 Candida albicans          Radiology Results  No results found.    I spoke with Melanee Left, MD from [facility] regarding A/P.    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   Receiving dexamethasone 12 mg weekly for diarrhea as outpt  10/16/21 - last Daratumumab dose (on hold due to diarrhea)     DATA:   09/07/2010: IgG Kappa MM  12/02/2021: Ig M <5, IgG 447, IgA 20, Ig E <2, SFLC Kappa 16.6, Lambda 1.6, Kappa/Lamda 10.38  12/14/21: IgG K on IFE, IgG 430, KLC 16.6, LLC 2.7, KLR 6.15  02/23/22 SPEP with stable M-spike, SFLC decreased Kappa 44.2 / Lambda 5.5 = K/L ratio = 8.04 (overall downtrending still)  04/23/22 M-spike 0.6, IgG K on IFE, IgG 997, Kappa 70.7/Lambda 19.8 = K/L ratio = 3.57     Chemo:  TBD, currently off therapy due to recurrent infections    Supportive Care:   Transfuse for Hg <7 and Platelets <10  IV Access: PIV     Primary Oncologist:   Dr. Jacelyn Grip ()  Dr. Iona Coach ([facility])    Disposition:  Lives in [place] - anticipate return to home with [facility]    # Immunocompromised  Immunocompromised due to underlying malignancy.   Not currently neutropenic. UCx 6/10 w/ 100,000CFU enterococcus & C. albicans. Not  currently having any sx of dysuria, previous UCx w/ C. albicans. Will defer treatment as asymptomatic sample likely contaminated w/ skin flora.     # Diarrhea,  # c/b CMV Viremia  # Hx prolonged norovirus infection  -Extended hospitalization at both [place] 1/23-04/16/22, with hypovolemic shock associated with norovirus, acute liver failure associated with pomylast, CMV colitis.   08/2021 - first +norovirus with persistent +norovirus s/p 7-day courses x 2 nitazoxanide (2/16-02/22/23 & 01/2022), but norovirus continued to be positive (most recently 04/07/22)- subsequent clearance of norovirus per test  6/9,   -Regarding CMV colitis, 12/17/21 flex sig showed CMV colitis, sp multiple cycles of gancyclovir IV, IVIG, valcyte 900 mg PO BID, then ultimately transitioned to letermovir, which she continues. Her CMV (iu/mL) 443,961--> 179,555--> 74 (2/22) --> 176 (3/1) --> 105 (6/9)  -At home was taking loperamide BID and diarrhea was controlled to 2-3 times daily. Diarrhea had improved to 1 stool on 6/11, but worsened on 6/12 (11 stools). Possibly r/t to inadequate dosing of loperamdie vs infectious etiology. GI c/s 6/13 for possible scope - per patient and family declined endoscopy in favor of supportive medical interventions and to revisit endoscopy if diarrhea doesn't improve.    6/14 - Given CMV stable and norovirus negative and enteric panel unrevealing - favor non-infectious etiologies for diarrhea    Data:  6/9: C. Diff neg, GI bacterial/viral/parasite panels neg, norovirus neg    Plan  -Continue letermovir 480 mg IV daily  -Appreciate GI consultation - last seen 6/13  -Defer endoscopy upon discussion with patient and family - revisit if medical management ineffective  - Continue loperamide '4mg'$  q6h ATC  - Continue tincture of opium '6mg'$  QID PRN   - Defer ID consultation in absence of new ID findings on enteric workup     # Acute liver failure, now requiring weekly paracentesis  # Pericentral/sinusioidal and periportal  fibrosis, ductal reaction with focal pericholangitis from vanishing bile duct in the setting of pomalidomide.   # Hepatic Encephalopathy  Diagnostic paracentesis negative for SBP, currently declining therapeutic para and abdomen soft. Malnutrition may also be contributing to ascites (see below).   -Continue Ursodiol 600 mg PO BID  -Continue rifaximin 550 mg PO QD  -Continue midodrine 10 mg PO TID  -Low sodium diet  -2L fluid restriction  -Defer scheduled paracentesis in favor of prn discomfort     # Failure to thrive  # PEG tube dependence  # Microcytic anemia - B12 deficiency  PEG tube placed during last admission,   -Continue TF - Nutren 1.5 - goal 91m/hr  -continue marinol 15 mg PO one time daily  -Follow up MMA  -Follow up Copper level      # Physical Deconditioning  -PT/OT - rec placement     VTE PPx:  Contraindicated: Platelets <= 50 (or around 50)     Nutritional Assessment  Severe Chronic disease or condition related malnutrition    During this hospitalization the patient is also being treated for:  - Anemia associated with malignancy (present on admission)    - Thrombocytopenia (present on admission)    - Coagulopathy (present on admission)    - Malignancy associated fatigue on admission  - Neoplasm/malignancy associated pain  - Hypoalbuminemia (present on admission)  - HYPOnatremia (present on admission)  - Fluid status: Volume overload on admission  - Cachexia    Code Status: FULL    CGardiner Rhyme NP  Pager:  [phone] Phone:  [phone] HospitalIdentification #:  [id number]   I spent a total time of 50 minutes in this episode of care preparing to see the patient, obtaining and/or reviewing separately obtained history, performing a medically appropriate examination and/or evaluation, counseling and educating patient/family/caregiver, ordering and reviewing medications, tests, or procedures, referring and communicating with other health care professionals, documenting clinical information in the  electronic or other health record, and communicating results to the patient/family/caregiver, and/or other care coordination.

## 2022-04-29 NOTE — Consults (Signed)
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 Ariel Braun, Ariel Braun, reported d'cing home from [place] [facility] with only a hospital bed and no other DME. Ariel demonstrated good tolerance today with hoyer lift to high back chair and motivated to transfer. Provided increased education regarding therapy and demosntrating good progress with consistent sessions, both Braun and Ariel agreeable to work with therapy. Ariel remains limited by global deconditioning and weakness resulting in fatigue, decreased head control when upright, impaired trunk strength requiring high back chair for support and impaired tolerance. Ariel requires max A for ADLs at this time but making progress in last two sessions as well agreeable. Recommend placement to continue progressing functional independence,strength and reduce caregiver burden.  Progress with OT: Good progress  Current maximal level of assist: Maximal assist  Next session focus:      Recommendations    Recommended Discharge Disposition Placement for continued therapy   Discharge DME Recommendations To be determined;Defer to next level of care   Equipment Recommendations     DME Comment     Discharge Recommendations Comment     Rehab Potential Unable to tolerate 3 hours of therapy a day;Patient participates well in therapy and is progressing towards goal   Anticipated Assistance Available at Discharge Family;Spouse/Significant other   Anticipated Type of Assistance Available at Discharge Assist as needed   Anticipated Time of Assistance Available at Discharge Unknown at this time   Barriers to Discharge Medical issues   Patient's current functional ability appropriate for D/C recommendations Yes     Inpatient Recommendations  OT Inpatient  Recommendations     Recommendation Comments Hoyer lift to high back chair   Current Maximal Level of Assist Needed Maximal assist     Brace/Precautions   Brace/Orthotic/Prosthetic:   Precautions:Has weight bearing limitation or precaution: Yes  Precautions and weight bearing status comments: Falls, delirium, skin, aspiration, contact     Subjective Report:"I did really well, didn't I?"    Patient/Family Goal:     Objective Findings and Interventions    Areas of Occupation    Grooming and Light Hygiene     Cueing Required     Comment     Self Feeding Contact guard assistance;Set up   Cueing Required     Comment engagingin in self feeding with spoon, increased education to continue progressing PO intake, weak cough with liquids.   Toileting     Cueing Required     Toileting Clothing Management     Cueing Required     Comment     Upper Body Dressing     Cueing Required     Comment     Lower Body Dressing     Cueing Required     Comment     Upper Body Bathing     Cueing Required     Comment     Lower Body Bathing     Cueing Required     Comment     ADL/IADL Comment         Functional Transfers    Bed Mobility From Supine;Sidelying   Bed Mobility To Sidelying;Supine   Level of Assist Moderate assistance;Minimal assistance   Cueing Required     Transfer From Bed  Transfer To Chair   Level of Assist Dependent   Cueing Required     Technique     Functional Transfer Comment Ariel requesting to get out of bed this AM. Ariel required min A rolling L and mod A rolling to R. 2p dependent asssist with hoyer to chair. Ariel Braun present and initiated conversation regarding Ariel and Braun's goal for therapy. Ariel agreeable to work with therapy and will continue to discuss primary goals w/c transferred identified as first goal.     Functional Balance for ADLs  Position Static Dynamic   Sit Static sitting level of assist: Contact guard assist-   Static sitting comment: Well supported in high back bedside chair, Posey belt donned,  head/neck supported by pillow  -        [place] for ADLs  6 Click Score: 11    Client Factors  Cognitive deficits noted in the following area(s):: Attention, Memory, Judgement/Safety, Orientation  Cognition comment:continues to have delayed processing, reported difficulty identfying therapy goals but nodding yes to toileting/shower transfers and ADLs                                                   OT Education  Education  Learner: Patient;Caregiver  Response: Needs reinforcement  Communication between other health care provider: PT;RN    Roma Schanz, OT  04/29/2022 12:17 PM

## 2022-04-29 NOTE — Plan of Care (Signed)
Problem: Discharge Planning - Adult  Goal: Knowledge of and participation in plan of care  Outcome: Progress within 12 hours     Problem: Fall, at Risk or Actual - Adult  Goal: Absence of falls and fall related injury  Outcome: Progress within 12 hours     Problem: Delirium - Adult / Pediatric  Goal: Absence or resolution of delirium  Outcome: Progress within 12 hours     Problem: Nutrition, Alteration in -Adult  Goal: Adequate nutritional intake  Outcome: Progress within 12 hours  Goal: Maximize nutritional intake per patient condition  Outcome: Progress within 12 hours     Problem: Pressure Injury,At Risk and Actual- Adult  Goal: Absence of any new pressure injury  Outcome: Progress within 12 hours  Goal: Pressure injury healing/stabilization  Outcome: Progress within 12 hours  Goal: Participation in preventative efforts and treatment plan (Patient/Family/Caregiver)  Outcome: Progress within 12 hours  Goal: Adequate nutritional intake  Outcome: Progress within 12 hours

## 2022-04-30 LAB — BASIC METABOLIC PANEL (NA, K, CL, CO2, BUN, CR, GLU, CA)
Anion Gap: 5 (ref 4–14)
Calcium, total, Serum / Plasma: 8.3 mg/dL — ABNORMAL LOW (ref 8.4–10.5)
Carbon Dioxide, Total: 23 mmol/L (ref 22–29)
Chloride, Serum / Plasma: 115 mmol/L — ABNORMAL HIGH (ref 101–110)
Creatinine: 0.46 mg/dL — ABNORMAL LOW (ref 0.55–1.02)
Glucose, non-fasting: 97 mg/dL (ref 70–199)
Potassium, Serum / Plasma: 4.5 mmol/L (ref 3.5–5.0)
Sodium, Serum / Plasma: 143 mmol/L (ref 135–145)
Urea Nitrogen, Serum / Plasma: 31 mg/dL — ABNORMAL HIGH (ref 7–25)
eGFRcr: 108 mL/min/{1.73_m2} (ref >59)

## 2022-04-30 LAB — ALANINE TRANSAMINASE: Alanine transaminase: 26 U/L (ref 10–61)

## 2022-04-30 LAB — COMPLETE BLOOD COUNT WITH DIFFERENTIAL
Abs Basophils: 0.02 10*9/L (ref 0.00–0.10)
Abs Eosinophils: 0.02 10*9/L (ref 0.00–0.40)
Abs Imm Granulocytes: 0.21 10*9/L — ABNORMAL HIGH (ref <0.10)
Abs Lymphocytes: 0.9 10*9/L — ABNORMAL LOW (ref 1.00–3.40)
Abs Monocytes: 0.52 10*9/L (ref 0.20–0.80)
Abs Neutrophils: 4.02 10*9/L (ref 1.80–6.80)
Hematocrit: 27.7 % — ABNORMAL LOW (ref 36.0–46.0)
Hemoglobin: 8.2 g/dL — ABNORMAL LOW (ref 12.0–15.5)
MCH: 33.3 pg (ref 26.0–34.0)
MCHC: 29.6 g/dL — ABNORMAL LOW (ref 31.0–36.0)
MCV: 113 fL — ABNORMAL HIGH (ref 80–100)
MPV: 12.3 fL (ref 9.1–12.6)
Platelet Count: 108 10*9/L — ABNORMAL LOW (ref 140–450)
RBC Count: 2.46 10*12/L — ABNORMAL LOW (ref 4.00–5.20)
RDW-CV: 18.3 % — ABNORMAL HIGH (ref 11.7–14.4)
WBC Count: 5.7 10*9/L (ref 3.4–10.0)

## 2022-04-30 LAB — ALKALINE PHOSPHATASE: Alkaline Phosphatase: 261 U/L — ABNORMAL HIGH (ref 38–108)

## 2022-04-30 LAB — BILIRUBIN, TOTAL: Bilirubin, Total: 0.9 mg/dL (ref 0.2–1.2)

## 2022-04-30 LAB — MAGNESIUM, SERUM / PLASMA: Magnesium, Serum / Plasma: 1.9 mg/dL (ref 1.6–2.6)

## 2022-04-30 LAB — PHOSPHORUS, SERUM / PLASMA: Phosphorus, Serum / Plasma: 3.4 mg/dL (ref 2.3–4.7)

## 2022-04-30 LAB — ASPARTATE TRANSAMINASE: AST: 66 U/L — ABNORMAL HIGH (ref 5–44)

## 2022-04-30 LAB — GAMMA-GLUTAMYL TRANSPEPTIDASE: Gamma-Glutamyl Transpeptidase: 128 U/L — ABNORMAL HIGH (ref 8–59)

## 2022-04-30 MED ORDER — ALUMINUM-MAG HYDROXIDE-SIMETHICONE 200 MG-200 MG-20 MG/5 ML ORAL SUSP
200-200-20 | ORAL | Status: DC | PRN
Start: 2022-04-30 — End: 2022-05-27
  Administered 2022-05-01 – 2022-05-07 (×4): via GASTROSTOMY
  Administered 2022-05-08: 07:00:00 15 mL via GASTROSTOMY
  Administered 2022-05-12 – 2022-05-17 (×5): via GASTROSTOMY

## 2022-04-30 MED ORDER — FOLIC ACID 1 MG TABLET
1 mg | Freq: Every day | ORAL | Status: DC
Start: 2022-04-30 — End: 2022-05-27
  Administered 2022-05-01 – 2022-05-07 (×7): via GASTROSTOMY
  Administered 2022-05-08: 16:00:00 1 mg via GASTROSTOMY
  Administered 2022-05-09 – 2022-05-27 (×19): via GASTROSTOMY

## 2022-04-30 MED ORDER — LETERMOVIR 480 MG TABLET
480 | Freq: Every day | ORAL | Status: DC
Start: 2022-04-30 — End: 2022-05-27
  Administered 2022-05-01 – 2022-05-15 (×14)
  Administered 2022-05-16 – 2022-05-27 (×12): 480 mg

## 2022-04-30 MED ORDER — ONDANSETRON HCL (PF) 4 MG/2 ML INJECTION SOLUTION
4 | Freq: Four times a day (QID) | INTRAMUSCULAR | Status: DC | PRN
Start: 2022-04-30 — End: 2022-04-30

## 2022-04-30 MED ORDER — HYDROCODONE 5 MG-ACETAMINOPHEN 325 MG TABLET
5-325 | Freq: Four times a day (QID) | ORAL | Status: DC | PRN
Start: 2022-04-30 — End: 2022-05-01
  Administered 2022-05-01 (×2): 1 via GASTROSTOMY

## 2022-04-30 MED ORDER — ONDANSETRON HCL 4 MG TABLET
4 | Freq: Four times a day (QID) | ORAL | Status: DC | PRN
Start: 2022-04-30 — End: 2022-04-30

## 2022-04-30 MED ORDER — FUROSEMIDE 20 MG TABLET
20 mg | Freq: Every day | ORAL | Status: DC
Start: 2022-04-30 — End: 2022-05-03
  Administered 2022-05-01 – 2022-05-03 (×4): 20 mg via GASTROSTOMY

## 2022-04-30 MED ORDER — SPIRONOLACTONE 25 MG TABLET
25 | Freq: Every day | ORAL | Status: DC
Start: 2022-04-30 — End: 2022-05-03
  Administered 2022-05-01 – 2022-05-03 (×4): 50 mg via GASTROSTOMY

## 2022-04-30 MED ORDER — ACETAMINOPHEN 325 MG TABLET
325 mg | Freq: Three times a day (TID) | ORAL | Status: DC | PRN
Start: 2022-04-30 — End: 2022-05-27
  Administered 2022-05-05 – 2022-05-27 (×6): 650 mg via GASTROSTOMY

## 2022-04-30 MED ORDER — ONDANSETRON HCL 4 MG TABLET
4 mg | Freq: Three times a day (TID) | ORAL | Status: DC
Start: 2022-04-30 — End: 2022-05-27
  Administered 2022-04-30 – 2022-05-09 (×25): via GASTROSTOMY
  Administered 2022-05-09: 01:00:00 8 mg via GASTROSTOMY
  Administered 2022-05-10 – 2022-05-27 (×48): via GASTROSTOMY

## 2022-04-30 MED FILL — XIFAXAN 550 MG TABLET: 550 mg | ORAL | Qty: 1

## 2022-04-30 MED FILL — PROCHLORPERAZINE EDISYLATE 10 MG/2 ML (5 MG/ML) INJECTION SOLUTION: 10 mg/2 mL (5 mg/mL) | INTRAMUSCULAR | Qty: 2

## 2022-04-30 MED FILL — ACETAMINOPHEN 325 MG TABLET: 325 mg | ORAL | Qty: 2

## 2022-04-30 MED FILL — ZINC ACETATE 33 MG/ML ORAL SOLN (PEDI BCH-SF): 33 mg/mL (10 mg of elemental zinc/mL) | ORAL | Qty: 2.5

## 2022-04-30 MED FILL — PREVYMIS 480 MG/24 ML INTRAVENOUS SOLUTION: 480 mg/24 mL | INTRAVENOUS | Qty: 24

## 2022-04-30 MED FILL — ZINC ACETATE DIHYDRATE (BULK) 100 % CRYSTALS: 100 % | Qty: 0.08

## 2022-04-30 MED FILL — URSODIOL (BULK) 100 % POWDER: 100 % | Qty: 0.6

## 2022-04-30 MED FILL — URSODIOL 60 MG/ML ORAL SYRUP: 60 mg/mL | ORAL | Qty: 10

## 2022-04-30 MED FILL — LANSOPRAZOLE 3 MG/ML ORAL SUSPENSION COMPOUNDING KIT: 3 mg/mL | ORAL | Qty: 10

## 2022-04-30 MED FILL — ONDANSETRON HCL 4 MG TABLET: 4 mg | ORAL | Qty: 2

## 2022-04-30 MED FILL — ANTI-DIARRHEAL (LOPERAMIDE) 2 MG TABLET: 2 mg | ORAL | Qty: 2

## 2022-04-30 MED FILL — HYDROCODONE 5 MG-ACETAMINOPHEN 325 MG TABLET: 5-325 mg | ORAL | Qty: 1

## 2022-04-30 MED FILL — PREVYMIS 480 MG TABLET: 480 mg | ORAL | Qty: 1

## 2022-04-30 MED FILL — HYDROXYZINE HCL 25 MG TABLET: 25 mg | ORAL | Qty: 1

## 2022-04-30 MED FILL — MAG-AL PLUS 200 MG-200 MG-20 MG/5 ML ORAL SUSPENSION: 200-200-20 mg/5 mL | ORAL | Qty: 30

## 2022-04-30 MED FILL — ONDANSETRON HCL (PF) 4 MG/2 ML INJECTION SOLUTION: 4 mg/2 mL | INTRAMUSCULAR | Qty: 2

## 2022-04-30 MED FILL — MIRTAZAPINE 15 MG TABLET: 15 mg | ORAL | Qty: 1

## 2022-04-30 NOTE — Consults (Signed)
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 Pt tolerated hoyer transfer to chair well and warmup supine prior. Pt continues to p/w global weakness and deconditioning however p/w improved head control during ADls in chair. Pt demonstrated delayed processing, impaired sustained attention and difficulty with cogntiive assessment see outcomes note. Pt demonstrating good participation in ADLs today with cueing. Pt limited by increased fatigue requriing encouragement and education to progress. Pt requires max A for ADLs at this time. Recommend placement.  Progress with OT: Good progress  Current maximal level of assist: Maximal assist  Next session focus:      Recommendations    Recommended Discharge Disposition Placement for continued therapy   Discharge DME Recommendations To be determined;Defer to next level of care   Equipment Recommendations     DME Comment     Discharge Recommendations Comment     Rehab Potential Unable to tolerate 3 hours of therapy a day;Patient participates well in therapy and is progressing towards goal   Anticipated Assistance Available at Discharge Family;Spouse/Significant other   Anticipated Type of Assistance Available at Discharge Assist as needed   Anticipated Time of Assistance Available at Discharge Unknown at this time   Barriers to Discharge Medical issues   Patient's current functional ability appropriate for D/C recommendations Yes     Inpatient Recommendations  OT Inpatient Recommendations     Recommendation Comments Hoyer lift to high back chair   Current Maximal Level of Assist Needed Maximal assist     Brace/Precautions   Brace/Orthotic/Prosthetic:   Precautions:Has weight bearing limitation or precaution:  Yes  Precautions and weight bearing status comments: Falls, delirium, skin, aspiration, contact     Subjective Report:"My arm hurts"    Patient/Family Goal:     Objective Findings and Interventions    Areas of Occupation    Grooming and Light Hygiene Supervision/safety;Set up   Cueing Required     Comment comb hair, wash face, and brush teeth   Self Feeding     Cueing Required     Comment     Toileting Moderate assistance   Cueing Required     Toileting Clothing Management Maximal assistance   Cueing Required     Comment mod A rolling, max A pericare incontinent of stool   Upper Body Dressing     Cueing Required     Comment     Lower Body Dressing     Cueing Required     Comment     Upper Body Bathing     Cueing Required     Comment     Lower Body Bathing     Cueing Required     Comment     ADL/IADL Comment         Functional Transfers    Bed Mobility From Supine;Sidelying   Bed Mobility To Sidelying;Supine   Level of Assist Moderate assistance   Cueing Required     Transfer From Bed   Transfer To Chair   Level of Assist Dependent   Cueing Required     Technique     Functional Transfer Comment Pt hoyer lift to chair 2p assist. Pt performed seated grooming activities and ADLs in chair. Pt tolerated 20 min and  breakfast arriving soon. Session 58 min     Functional Balance for ADLs  Position Static Dynamic   Sit Static sitting level of assist: Supervision-   Static sitting comment: posey belt in chair  -        Stand  -      -            [place] for ADLs  6 Click Score: 13    Client Factors  Cognitive deficits noted in the following area(s):: Attention, Memory, Judgement/Safety, Orientation  Cognition comment:See outcomes    Problem solving;Safety awareness;Information processing;Problem ID;Initiation;Organization;Sequencing;Self-regulation;Working Chief of Staff functions comment:                                         Roma Schanz, OT  04/30/2022 11:26 AM

## 2022-04-30 NOTE — Consults (Signed)
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  Check residuals every 8 hours and hold if equal to or greater than 500 mL.    If PEG-J tube present, please use the J-port to feed the patient.    Anthropometric Data and Assessment:  Height: 153 cm (5' 0.24")  Ideal Body Weight: 45.99 kg    Usual body weight:90.91 kg (per previous admissions)   Admit weight: 66.2 kg (146 lb) (04/25/22 1427) (Bed scale)  Current weight: 69 kg (152 lb 1.9 oz) (04/29/22 1900) (Other scale)    Calculation Weight: 69 kg (Percent IBW (calculated) (%): 150)  Calc weight BMI: 29.5 kg/m^2  Alternative Weight (kg): 53.4 kg (MAW)    Wt Readings from Last 10 Encounters:   04/29/22 69 kg (152 lb 1.9 oz)   01/15/22 96 kg (211 lb 10.3 oz)   12/12/18 96.3 kg (212 lb 4.9 oz)   08/08/18 98.9 kg (218 lb 1.6 oz)   05/23/18 98.7 kg (217 lb 9.6 oz)   04/11/18 98.5 kg (217 lb 1.6 oz)   01/10/18 98.4 kg (217 lb)   09/16/17 96.6 kg (213 lb)   06/21/17 96.4 kg (212 lb 9.6 oz)   03/15/17 95.6 kg (210 lb 12.8 oz)     Total weight change: -27 kg (-28.13 %)  Time frame for weight change: 3 months    Assessment of weight change: Severe weight loss  Per chart, pt's UBW seems to be between 210-220 lbs. Pt has had a recent severe weight loss of 28% in 3 months.    Nutrition-focused physical findings:   Visual Assessment: Shows visual signs of malnutrition    Physical Findings (Exam Date 06/16)  Extremities: Lower extremity edema  Muscles  Temples: Severe Depletion: Hollowing, scooping, depression  Clavicle: Severe Depletion: Protruding, prominent  bone  Shoulders: Severe Depletion: Shoulder to arm joint looks square. Bones prominent. Acromion protrusion very prominent.  Interosseous Muscles: Mild/Moderate Depletion: Slightly depressed  Patellae:  (UTA given edema)  Quadriceps:  (UTA given edema)  Calves:  (UTA given edema)  Adipose  Orbital: Severe Depletion: Hollow look, depressions, dark circles, loose skin  Triceps: Well-nourished: Ample fat tissue obvious between folds of skin    Edema (per RN assessment):   RLE Edema: Mild pitting, slight indentation (04/29/22 2100)  LLE Edema: Mild pitting, slight indentation (04/29/22 2100)    Cognition (per RN assessment):  Level of Consciousness: Somnolent  Orientation Level: Disoriented to time, Disoriented to place  Cognition: Follows commands  Speech: Delayed responses    GI Findings and Assessment:   Pt's daughter notes improvement in diarrhea. States she showed some signs of lactose intolerance with Boost drinks.      GI: Appetite, decrease, Diarrhea     Per RN assessment:   Gastrointestinal  *Gastrointestinal (WDL): Exceptions to WDL  Abdomen Inspection: Distended, Rounded, Tube/drain  Bowel Sounds (All Quadrants): Present  Tenderness: Tenderness  Passing Flatus: Yes    Lines, Drains, Airway, Wounds:   Patient Lines/Drains/Airways Status       Active LDAs  Name Placement date Placement time Site Days    Peripheral IV 04/27/22 Extended 4cm Anterior;Left;Upper Arm 04/27/22  1053  Arm  3    Wound 12/19/21 Skin tear Sacrum 12/19/21  1900  Sacrum  131    Wound 04/14/22 Back Right 04/14/22  --  Back  16    Wound 04/16/22 Abdomen Right 04/16/22  --  Abdomen  14    Feeding Tube Left lower quadrant --  --  LLQ  --                  Food and Nutrition Intake:   Information obtained from: Family    Percentage of meals eaten for the past 168 hrs:   Percent Meals Eaten (%)   04/30/22 1015 0 %   04/27/22 1800 15 %   04/27/22 1300 10 %   04/27/22 1230 0 %   04/27/22 0933 0 %   04/27/22 0425 50 %   04/26/22 2119 0 %    04/25/22 1616 2 %   04/24/22 0000 0 %     Enteral/Parenteral Nutrition Intake  Enteral Nutrition Intake Details: Home TF Regimen: Nutren 1.5 @ 50 mL/hr continuous. Daughter states that it took time for patient to tolerate full TF volume, but was tolerating PTA. Used to be on Osmolite 1.5, but had to switch given out of stock. TF regimen provides 1200 mL, 1800 kcal, 82 g protein.     Oral Food and Fluid Intake  Meal/Snack Pattern: Grazing    Diet Recall Details: Per pt's daughter, pt grazes on foods during the day (bites of food, juice). Her PO intake has been increasing while in-house. States she drinks a whole almond milk daily with bites of other soft/liquid foods. Daugher states that pt prefers liquid foods over solid.     Intake Data (06/10-06/15)  Average Daily Intake   Calories (total) Protein (total)   Enteral Nutrition 933 kcal (54%) 42.5 g (53%)        Assessment of energy/protein intake:   Suboptimal protein/energy intake  Intake met what percentage of estimated needs?: 51 - 75% of estimated needs  Duration of sub-optimal intake: greater than or equal to 7 days     Barriers to adequate nutrient intake: Workflow barriers, Poor appetite, Frequent holds to nutrition support regimen while hospitalized       Significant Labs:   Biochemical:  Lab Results   Component Value Date    Sodium, Serum / Plasma 143 04/30/2022    Potassium, Serum / Plasma 4.5 04/30/2022    Chloride, Serum / Plasma 115 (H) 04/30/2022    Carbon Dioxide, Total 23 04/30/2022    Urea Nitrogen, Serum / Plasma 31 (H) 04/30/2022    Creatinine 0.46 (L) 04/30/2022    eGFRcr 108 04/30/2022    Glucose 101 (H) 05/10/2019    Glucose, non-fasting 97 04/30/2022    Calcium, total, Serum / Plasma 8.3 (L) 04/30/2022    Calcium, Ionized, whole blood 1.17 04/23/2022    Magnesium, Serum / Plasma 1.9 04/30/2022    Phosphorus, Serum / Plasma 3.4 04/30/2022     Lab Results   Component Value Date    Alanine transaminase 26 04/30/2022    AST 66 (H) 04/30/2022     Alkaline Phosphatase 261 (H) 04/30/2022    Bilirubin, Direct 6.2 (H) 01/15/2022    Bilirubin, Total 0.9 04/30/2022    Gamma-Glutamyl Transpeptidase 128 (H) 04/30/2022    Triglycerides, serum 259 (H) 12/20/2021    PT 16.7 (H)  04/23/2022    Ammonia 32 04/29/2022     Lab Results   Component Value Date    Lactate, whole blood 1.0 04/23/2022     Lab Results   Component Value Date    Hemoglobin 8.2 (L) 04/30/2022    MCV 113 (H) 04/30/2022    Abs Neutrophils 4.02 04/30/2022     Lab Results   Component Value Date    Glucose, Glucometer 89 12/30/2021    Glucose, Glucometer 99 12/29/2021    Glucose, Glucometer 105 12/29/2021    Glucose, Glucometer 84 12/28/2021    Glucose, Glucometer 88 12/28/2021    Glucose, Glucometer 93 12/27/2021    Glucose, Glucometer 81 12/27/2021    Glucose, Glucometer 102 12/26/2021    Glucose, Glucometer 86 12/26/2021    Glucose, Glucometer 88 12/25/2021     Micronutrient Profile:  Lab Results   Component Value Date    Vitamin D, 25-Hydroxy 11 (L) 04/23/2022    Ferritin, Serum/Plasma 948 (H) 12/20/2021    Iron, serum 60 12/11/2021    Transferrin, Serum/Plasma 46 (L) 12/11/2021    % Saturation 93 (H) 12/11/2021    Vitamin B12 242 (L) 04/28/2022    Folate, serum 12.5 04/28/2022     Inflammatory profile:   Lab Results   Component Value Date    C-Reactive Protein 11.3 (H) 12/13/2021    Albumin, Serum / Plasma 1.9 (L) 04/23/2022    WBC Count 5.7 04/30/2022     Stool studies:  Lab Results   Component Value Date    Clostridium difficile Toxin gene: Not detected 04/23/2022    Clostridium difficile Comment: Negative test 04/23/2022     Significant Meds:   Scheduled Meds:   0.9% sodium chloride flush  3 mL Intravenous Q12H [place]    cholecalciferol (vitamin D3)  2,000 Units Per G Tube Daily [place]    cyanocobalamin (Vitamin B12)  1,000 mcg Per G Tube Daily [place]    ergocalciferol (vitamin D2)  50,000 Units Per G Tube Q7 Days    [START ON 9/60/4540] folic acid  1 mg Per G Tube Daily SCH    furosemide  20 mg Per G Tube  Daily [place]    lansoprazole  30 mg Per G Tube Q AM Before Breakfast [place]    [START ON 05/01/2022] letermovir  480 mg Other Daily SCH    loperamide  4 mg Feeding Tube Q6H SCH    mirtazapine  15 mg Per G Tube Daily At Bedtime [facility]    multivitamin  1 tablet Per G Tube Daily [place]    ondansetron  8 mg Per G Tube TID    rifAXIMin  550 mg Feeding Tube BID [facility]    spironolactone  50 mg Per G Tube Daily [place]    thiamine mononitrate (vit B1)  100 mg Per G Tube Daily [place]    ursodiol  600 mg Per G Tube BID [facility]    zinc acetate  25 mg of elemental zinc (Zn) Per G Tube BID [place]     Continuous Infusions:  PRN Meds:.0.9% sodium chloride flush, acetaminophen, albuterol **OR** albuterol, aluminum-magnesium hydroxide-simethicone, HYDROcodone-acetaminophen, hydrOXYzine, opium, prochlorperazine **OR** prochlorperazine    Comparative Standards:   Energy Needs: MSJ: 9811- 9147 (based on 1175.25: x 1.4 - 1.5)  Protein Needs: 69.42 g - 80.1 g (1.3 - 1.5 g/kg based on 53.4 kg (MAW))  Fluid needs: Holl[place]-Segar: 2001 mL/d     Nutrition diagnosis:   Inadequate oral intake related to poor appetite, AMS as  evidenced by reliance on EN supplementation to meet nutrient needs. Active.      Malnutrition Summary:  Severe Chronic disease or condition related malnutrition related to AMS, poor PO tolerance as evidenced by weight loss of >7.5% in 3 months, severe loss of muscle mass, severe loss of body fat .  Present on admission.    Nutrition Intervention:  Oral Nutrition:    Inpatient Orders: Current diet order appropriate    Oral Nutrition Supplements and/or Food Fortification:  --> Dillard [place] x1 daily    Enteral Nutrition:   Enteral nutrition plan: Continue enteral nutrition as ordered  --> Run Nutren 1.5 @ goal rate of 50 mL/hr.  Provides 1200 mL, 1800 kcal, 82 g protein, 8.8 gm CHO/hr, 100% RDI for vitamins/minerals.     Tube Feed Monitoring:  --> Weigh patient twice weekly  --> Monitor serum electrolytes, Cr, BUN, Mg, Phos, Ca/iCa daily (until  stable)    Hydration  --> Additional hydration per team     Vitamins/Minerals  Recommend vitamin supplement therapy, Recommend mineral supplement therapy    Specific vitamin recommendations: Vitamin D, Multivitamin, Thiamin, Vitamin B12  --> Continue Vitamin D, MVI, Thiamine, and Vitamin B12 as ordered    Specific mineral supplements therapy: Zinc  --> Continue Zinc while pt has diarrhea      Nutrition Related Medication Recommendations:   Appetite Stimulant: Continue  --> Per team    Nutrition Monitoring/Evaluation:  Nutrition Goal Indicator: Energy intake  Nutrition Goal Criteria: TF to meet >75% estimated energy needs  Nutrition Goal Progress: Some progress toward goal  Nutrition Goal 2 Indicator: Macronutrient intake  Nutrition Goal 2 Criteria: TF to meet >75% estimated protein needs  Nutrition Goal 2 Progress: Some progress toward goal    Sonny Dandy, [place], [place]  [place] [phone]

## 2022-04-30 NOTE — Progress Notes (Signed)
[facility] PROGRESS NOTE     This is an independent service.   The available consultant for this service is Kari Baars, MD.             24 Hour Course  -Awaiting MMA and copper levels  -Patient and daughter agreed to modified barium swallow test  -Starting diuresis with spironolactone and lasix for ascites  -Scheduled nausea meds given difficulty at medication times    Subjective  Patient reports she is doing ok today. She was quite anxious and emotional when I saw her in the morning as she reported an episode where she felt like she was choking. Otherwise she denies abdominal pain/tenderness. Reports improvement in loose stools - said she only had a few in 24 hours. Daughter at bedside later reported that her baseline at home is 2-3 BMs in 24 hours, so they feel that is back to baseline. Mild nausea with taking meds, usually takes zofran prior to meds at home and will try that here (now scheduled).    Vitals  Temp:  [36.6 C (97.8 F)-37.8 C (100 F)] 37 C (98.6 F)  Heart Rate:  [88-98] 92  *Resp:  [16-17] 16  BP: (99-119)/(63-79) 99/70  SpO2:  [99 %-100 %] 99 %    Most Recent Weight: 69 kg (152 lb 1.9 oz)  Admission Weight: 66.2 kg (146 lb)      Intake/Output Summary (Last 24 hours) at 04/30/2022 1404  Last data filed at 04/30/2022 1128  Gross per 24 hour   Intake 1517 ml   Output 250 ml   Net 1267 ml       Physical Exam  Gen: NAD, alert and oriented x4, but slowed mentation and delayed verbal responses  HEENT: EOMI, moist mucus membranes  Resp: no crackles/wheezing  Card: RRR, no murmurs  Gi: soft, nontender, distended  Ext: negative BLE edema   Skin: Ecchymosis to BUE  Neuro: no focal deficits  Lines: PIV x1 in place, clean dry & intact dressing    [place]sky Performance Status: 39, Disabled; requires special care and assistance (ECOG equivalent 3)    Scheduled Meds:   0.9% sodium chloride flush  3 mL Intravenous Q12H [place]    cholecalciferol (vitamin D3)  2,000 Units Per G Tube Daily [place]     cyanocobalamin (Vitamin B12)  1,000 mcg Per G Tube Daily [place]    ergocalciferol (vitamin D2)  50,000 Units Per G Tube Q7 Days    [START ON 7/42/5956] folic acid  1 mg Per G Tube Daily SCH    furosemide  20 mg Per G Tube Daily [place]    lansoprazole  30 mg Per G Tube Q AM Before Breakfast [facility]    [START ON 05/01/2022] letermovir  480 mg Other Daily SCH    loperamide  4 mg Feeding Tube Q6H SCH    mirtazapine  15 mg Per G Tube Daily At Bedtime [facility]    multivitamin  1 tablet Per G Tube Daily [place]    ondansetron  8 mg Per G Tube TID    rifAXIMin  550 mg Feeding Tube BID [facility]    spironolactone  50 mg Per G Tube Daily [place]    thiamine mononitrate (vit B1)  100 mg Per G Tube Daily [place]    ursodiol  600 mg Per G Tube BID [facility]    zinc acetate  25 mg of elemental zinc (Zn) Per G Tube BID SCH     Continuous Infusions:  PRN  Meds:   0.9% sodium chloride flush  3 mL Intravenous PRN    acetaminophen  650 mg Per G Tube Q8H PRN    albuterol  2.5 mg Nebulization Q4H PRN    Or    albuterol  2.5 mg Nebulization Q4H PRN    aluminum-magnesium hydroxide-simethicone  15 mL Per G Tube Q4H PRN    HYDROcodone-acetaminophen  1 tablet Per G Tube Q6H PRN    hydrOXYzine  25 mg Feeding Tube Q4H PRN    opium  6 mg Oral 4x Daily PRN    prochlorperazine  5 mg Intravenous Q8H PRN    Or    prochlorperazine  5 mg Per G Tube Q8H PRN     Data    CBC        04/30/22  0540 04/29/22  0548   WBC 5.7 4.5   HGB 8.2* 7.5*   HCT 27.7* 24.6*   PLT 108* 106*       Coags  No results found in last 36 hours  Chem7        04/30/22  0540 04/29/22  0548   NA 143 143   K 4.5 4.3   CL 115* 117*   CO2 23 18*   BUN 31* 35*   CREAT 0.46* 0.40*   GLU 97 105       Electrolytes        04/30/22  0540 04/29/22  0548   CA 8.3* 7.9*   MG 1.9 1.8   PO4 3.4 3.0       Liver Panel        04/30/22  0540 04/29/22  0548   AST 66* 62*   ALT 26 24   ALKP 261* 233*   TBILI 0.9 0.7       Microbiology Results (last 72 hours)       Procedure Component Value Units Date/Time    Bacterial Culture, Normally  Sterile Sites, With Gram Stain [[id number]] Collected: 04/23/22 0028    Order Status: Completed Specimen: Not Applicable from Peritoneal Fluid Updated: 04/30/22 1159     Gram Stain No PMNs seen , Rare Host cells (not PMNs, RBCs, or squamous epithelial cells) , Few RBCs      No organisms seen     Bacterial Culture, Sterile Sites No growth 7 days.    Peripheral Blood Culture [[id number]] Collected: 04/23/22 0224    Order Status: Completed Specimen: Peripheral Blood Updated: 04/29/22 0548     Peripheral Blood Culture No growth 6 days.    Peripheral Blood Culture [[id number]] Collected: 04/23/22 0224    Order Status: Completed Specimen: Peripheral Blood Updated: 04/29/22 0548     Peripheral Blood Culture No growth 6 days.    COVID-19 RNA, RT-PCR/Nucleic Acid Amplification Screening (Asymptomatic and No Specific COVID-19 Suspicion); No [[id number]] Collected: 04/28/22 0359    Order Status: Completed Specimen: Not Applicable from Bilateral Anterior Nares +/- OP Swab Updated: 04/28/22 1652     COVID-19 RNA, RT-PCR/Nucleic Acid Amplification Not detected     Comment: (Reported by Lab to Public Health.)  (Results reported to [facility])          Comments See Comment     Comment: Bilateral Anterior nares and OP Swab  in universal transport medium               Radiology Results  No results found.    I spoke with Kari Baars, MD from [facility] regarding A/P.    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   Receiving  dexamethasone 12 mg weekly for diarrhea as outpt  10/16/21 - last Daratumumab dose (on hold due to diarrhea)     DATA:   09/07/2010: IgG Kappa MM  12/02/2021: Ig M <5, IgG 447, IgA 20, Ig E <2, SFLC Kappa 16.6, Lambda 1.6, Kappa/Lamda 10.38  12/14/21: IgG K on IFE, IgG 430, KLC 16.6, LLC 2.7, KLR 6.15  02/23/22 SPEP with stable M-spike, SFLC decreased Kappa 44.2 / Lambda 5.5 = K/L ratio = 8.04 (overall downtrending still)  04/23/22 M-spike 0.6, IgG K on IFE, IgG 997, Kappa 70.7/Lambda 19.8 = K/L ratio = 3.57     Chemo:  TBD, currently off therapy due to recurrent infections    Supportive Care:   Transfuse for Hgb <7 and Platelets <10  Access: PIV     Primary Oncologist:   Dr. Jacelyn Grip ([place])  Dr. Iona Coach ([place])    Disposition:  Lives in [place] - anticipate return to home with [facility] vs SNF, currently working with PT    # Immunocompromised  Immunocompromised due to underlying malignancy.   Not currently neutropenic. UCx 6/10 w/ 100,000CFU enterococcus & C. albicans. Not currently having any sx of dysuria, previous UCx w/ C. albicans. Will defer treatment as asymptomatic sample likely contaminated w/ skin flora.     # Diarrhea  # c/b CMV Viremia  # Hx prolonged norovirus infection  -Extended hospitalization at both [facility] 1/23-04/16/22, with hypovolemic shock associated with norovirus, acute liver failure associated with pomylast, CMV colitis.   08/2021 - first +norovirus with persistent +norovirus s/p 7-day courses x 2 nitazoxanide (2/16-02/22/23 & 01/2022), but norovirus continued to be positive (most recently 04/07/22)- subsequent clearance of norovirus per test 6/9,   -Regarding CMV colitis, 12/17/21 flex sig showed CMV colitis, sp multiple cycles of gancyclovir IV, IVIG, valcyte 900 mg PO BID, then ultimately transitioned to letermovir, which she continues. Her CMV (iu/mL) 443,961--> 179,555--> 74 (2/22) --> 176 (3/1) --> 105 (6/9)  -At home was taking loperamide BID and diarrhea was controlled to 2-3 times daily. Diarrhea  had improved to 1 stool on 6/11, but worsened on 6/12 (11 stools). Possibly r/t to inadequate dosing of loperamdie vs infectious etiology. GI c/s 6/13 for possible scope - per patient and family declined endoscopy in favor of supportive medical interventions and to revisit endoscopy if diarrhea doesn't improve.    6/14 - Given CMV stable and norovirus negative and enteric panel unrevealing - favor non-infectious etiologies for diarrhea    Data:  6/9: C. Diff neg, GI bacterial/viral/parasite panels neg, norovirus neg    Plan  -Continue letermovir daily (now per G tube 6/16- )  -Appreciate GI consult - last seen 6/13  -Defer endoscopy upon discussion with patient and family - revisit if medical mgmt ineffective, but currently back to baseline (2-3 stools in 24 hours) per patient and daughter  - Continue loperamide q6h ATC  -  Continue tincture of opium QID PRN   - Defer ID consultation in absence of new ID findings on enteric workup     # Acute liver failure, now requiring weekly paracentesis  # Pericentral/sinusioidal and periportal fibrosis, ductal reaction with focal pericholangitis from vanishing bile duct in the setting of pomalidomide.   # Hepatic Encephalopathy  Diagnostic paracentesis negative for SBP, currently declining therapeutic para and abdomen soft. Malnutrition may also be contributing to ascites (see below).   -Start spironolactone ('50mg'$ ) / lasix ('20mg'$ ) to minimize accumulation of ascites (6/16- )  -Continue Ursodiol 600 mg PO BID  -Continue rifaximin 550 mg PO QD  -Continue midodrine 10 mg PO TID  -Low sodium diet  -2L fluid restriction  -Defer scheduled paracentesis in favor of prn discomfort     # Failure to thrive  # PEG tube dependence  # Macrocytic anemia - B12 deficiency  PEG tube placed during last admission.  -Continue TF - Nutren 1.5 - goal 48m/hr  -Continue cyanocobalamin 10081m daily  -Follow up MMA  -Follow up Copper level     # Dysphagia  Dysphagia and aspiration risk assessed by speech  language pathology. SLP recommending Modified Barium Swallow test to objectively measure swallow physiology to best support diet recommendations and supportive strategies for reduce aspiration risk. Patient and daughter agreeable today 04/30/22.  - Ordered MBSS eval  - See failure to thrive section as well re: PEG tube and nutritional deficiencies    # Physical Deconditioning  -PT/OT - recommend placement SNF     VTE PPx:  Contraindicated: Platelets <= 50 (or around 50)     Nutritional Assessment  Severe Chronic disease or condition related malnutrition    During this hospitalization the patient is also being treated for:  - Anemia associated with malignancy (present on admission)    - Thrombocytopenia (present on admission)    - Coagulopathy (present on admission)    - Malignancy associated fatigue on admission  - Neoplasm/malignancy associated pain  - Hypoalbuminemia (present on admission)  - HYPOnatremia (present on admission)  - Fluid status: Volume overload on admission  - Cachexia    Code Status: FULL    JaAbran DukeNP  Hematology-Oncology BMT/CT   04/30/22     I spent a total time of 50 minutes in this episode of care preparing to see the patient, obtaining and/or reviewing separately obtained history, performing a medically appropriate examination and/or evaluation, counseling and educating patient/family/caregiver, ordering and reviewing medications, tests, or procedures, referring and communicating with other health care professionals, documenting clinical information in the electronic or other health record, and communicating results to the patient/family/caregiver, and/or other care coordination.

## 2022-05-01 LAB — MISCELLANEOUS OUTSIDE LAB TEST: Ref Lab Test Code: 50340

## 2022-05-01 LAB — MAGNESIUM, SERUM / PLASMA: Magnesium, Serum / Plasma: 1.8 mg/dL (ref 1.6–2.6)

## 2022-05-01 LAB — COMPLETE BLOOD COUNT WITH DIFFERENTIAL
Abs Basophils: 0.02 10*9/L (ref 0.00–0.10)
Abs Eosinophils: 0.02 10*9/L (ref 0.00–0.40)
Abs Imm Granulocytes: 0.18 10*9/L — ABNORMAL HIGH (ref <0.10)
Abs Lymphocytes: 0.88 10*9/L — ABNORMAL LOW (ref 1.00–3.40)
Abs Monocytes: 0.62 10*9/L (ref 0.20–0.80)
Abs Neutrophils: 3.8 10*9/L (ref 1.80–6.80)
Hematocrit: 26.2 % — ABNORMAL LOW (ref 36.0–46.0)
Hemoglobin: 8 g/dL — ABNORMAL LOW (ref 12.0–15.5)
MCH: 34 pg (ref 26.0–34.0)
MCHC: 30.5 g/dL — ABNORMAL LOW (ref 31.0–36.0)
MCV: 112 fL — ABNORMAL HIGH (ref 80–100)
MPV: 12.6 fL (ref 9.1–12.6)
Platelet Count: 99 10*9/L — ABNORMAL LOW (ref 140–450)
RBC Count: 2.35 10*12/L — ABNORMAL LOW (ref 4.00–5.20)
RDW-CV: 18 % — ABNORMAL HIGH (ref 11.7–14.4)
WBC Count: 5.5 10*9/L (ref 3.4–10.0)

## 2022-05-01 LAB — ALKALINE PHOSPHATASE: Alkaline Phosphatase: 262 U/L — ABNORMAL HIGH (ref 38–108)

## 2022-05-01 LAB — BASIC METABOLIC PANEL (NA, K, CL, CO2, BUN, CR, GLU, CA)
Anion Gap: 5 (ref 4–14)
Calcium, total, Serum / Plasma: 8.2 mg/dL — ABNORMAL LOW (ref 8.4–10.5)
Carbon Dioxide, Total: 23 mmol/L (ref 22–29)
Chloride, Serum / Plasma: 115 mmol/L — ABNORMAL HIGH (ref 101–110)
Creatinine: 0.47 mg/dL — ABNORMAL LOW (ref 0.55–1.02)
Glucose, non-fasting: 100 mg/dL (ref 70–199)
Potassium, Serum / Plasma: 4.7 mmol/L (ref 3.5–5.0)
Sodium, Serum / Plasma: 143 mmol/L (ref 135–145)
Urea Nitrogen, Serum / Plasma: 31 mg/dL — ABNORMAL HIGH (ref 7–25)
eGFRcr: 108 mL/min/{1.73_m2} (ref >59)

## 2022-05-01 LAB — GAMMA-GLUTAMYL TRANSPEPTIDASE: Gamma-Glutamyl Transpeptidase: 115 U/L — ABNORMAL HIGH (ref 8–59)

## 2022-05-01 LAB — BILIRUBIN, TOTAL: Bilirubin, Total: 0.8 mg/dL (ref 0.2–1.2)

## 2022-05-01 LAB — PHOSPHORUS, SERUM / PLASMA: Phosphorus, Serum / Plasma: 3.1 mg/dL (ref 2.3–4.7)

## 2022-05-01 LAB — ALANINE TRANSAMINASE: Alanine transaminase: 28 U/L (ref 10–61)

## 2022-05-01 LAB — ASPARTATE TRANSAMINASE: AST: 68 U/L — ABNORMAL HIGH (ref 5–44)

## 2022-05-01 MED ORDER — HYDROCODONE 5 MG-ACETAMINOPHEN 325 MG TABLET
5-325 mg | Freq: Four times a day (QID) | ORAL | Status: DC | PRN
Start: 2022-05-01 — End: 2022-05-27
  Administered 2022-05-01 – 2022-05-05 (×5): 1 via GASTROSTOMY
  Administered 2022-05-05: 12:00:00 2 via GASTROSTOMY
  Administered 2022-05-06: 17:00:00 1 via GASTROSTOMY
  Administered 2022-05-06 – 2022-05-07 (×2): 2 via GASTROSTOMY
  Administered 2022-05-08 – 2022-05-10 (×5): 1 via GASTROSTOMY
  Administered 2022-05-11: 11:00:00 2 via GASTROSTOMY
  Administered 2022-05-11: 1 via GASTROSTOMY
  Administered 2022-05-12 – 2022-05-21 (×15): 2 via GASTROSTOMY
  Administered 2022-05-21 – 2022-05-24 (×5): 1 via GASTROSTOMY

## 2022-05-01 MED ORDER — HYDROMORPHONE (PF) 0.5 MG/0.5 ML INJECTION SYRINGE
0.5 | INTRAMUSCULAR | Status: DC | PRN
Start: 2022-05-01 — End: 2022-05-12
  Administered 2022-05-01 – 2022-05-12 (×10): 0 mg via INTRAVENOUS
  Administered 2022-05-12: 14:00:00 via INTRAVENOUS

## 2022-05-01 MED ORDER — HYDROCODONE 5 MG-ACETAMINOPHEN 325 MG TABLET
5-325 | Freq: Four times a day (QID) | ORAL | Status: DC | PRN
Start: 2022-05-01 — End: 2022-05-01

## 2022-05-01 MED ORDER — CYANOCOBALAMIN (VIT B-12) 1,000 MCG/ML INJECTION SOLUTION
1000 | Freq: Every day | INTRAMUSCULAR | Status: AC
Start: 2022-05-01 — End: 2022-05-08
  Administered 2022-05-01 – 2022-05-07 (×7): 1000 ug via SUBCUTANEOUS

## 2022-05-01 MED ORDER — CYANOCOBALAMIN (VIT B-12) 1,000 MCG/ML INJECTION SOLUTION
1000 mcg/mL | INTRAMUSCULAR | Status: DC
Start: 2022-05-01 — End: 2022-05-27
  Administered 2022-05-15 – 2022-05-22 (×2): 1000 ug via SUBCUTANEOUS

## 2022-05-01 MED ORDER — HYDROMORPHONE (PF) 0.5 MG/0.5 ML INJECTION SYRINGE
0.5 mg/ mL | Freq: Once | INTRAMUSCULAR | Status: DC
Start: 2022-05-01 — End: 2022-05-04

## 2022-05-01 MED FILL — ZINC ACETATE DIHYDRATE (BULK) 100 % CRYSTALS: 100 % | Qty: 0.08

## 2022-05-01 MED FILL — CYANOCOBALAMIN (VIT B-12) 1,000 MCG/ML INJECTION SOLUTION: 1000 mcg/mL | INTRAMUSCULAR | Qty: 1

## 2022-05-01 MED FILL — TAB-A-VITE 400 MCG TABLET: 400 mcg | ORAL | Qty: 1

## 2022-05-01 MED FILL — ONDANSETRON HCL 4 MG TABLET: 4 mg | ORAL | Qty: 2

## 2022-05-01 MED FILL — URSODIOL (BULK) 100 % POWDER: 100 % | Qty: 0.6

## 2022-05-01 MED FILL — ANTI-DIARRHEAL (LOPERAMIDE) 2 MG TABLET: 2 mg | ORAL | Qty: 2

## 2022-05-01 MED FILL — CHOLECALCIFEROL (VITAMIN D3) 25 MCG (1,000 UNIT) TABLET: 1000 UNITS | ORAL | Qty: 2

## 2022-05-01 MED FILL — DILAUDID (PF) 0.5 MG/0.5 ML INJECTION SYRINGE: 0.5 mg/ mL | INTRAMUSCULAR | Qty: 0.5

## 2022-05-01 MED FILL — PROCHLORPERAZINE EDISYLATE 10 MG/2 ML (5 MG/ML) INJECTION SOLUTION: 10 mg/2 mL (5 mg/mL) | INTRAMUSCULAR | Qty: 2

## 2022-05-01 MED FILL — FUROSEMIDE 20 MG TABLET: 20 mg | ORAL | Qty: 1

## 2022-05-01 MED FILL — MIRTAZAPINE 15 MG TABLET: 15 mg | ORAL | Qty: 1

## 2022-05-01 MED FILL — HYDROCODONE 5 MG-ACETAMINOPHEN 325 MG TABLET: 5-325 mg | ORAL | Qty: 1

## 2022-05-01 MED FILL — THIAMINE MONONITRATE (VITAMIN B1) 100 MG TABLET: 100 mg | ORAL | Qty: 1

## 2022-05-01 MED FILL — FOLIC ACID 1 MG TABLET: 1 mg | ORAL | Qty: 1

## 2022-05-01 MED FILL — VITAMIN B-12  1,000 MCG TABLET: 1000 mcg | ORAL | Qty: 1

## 2022-05-01 MED FILL — SPIRONOLACTONE 25 MG TABLET: 25 mg | ORAL | Qty: 2

## 2022-05-01 MED FILL — XIFAXAN 550 MG TABLET: 550 mg | ORAL | Qty: 1

## 2022-05-01 MED FILL — MAG-AL PLUS 200 MG-200 MG-20 MG/5 ML ORAL SUSPENSION: 200-200-20 mg/5 mL | ORAL | Qty: 30

## 2022-05-01 NOTE — Plan of Care (Signed)
Problem: Discharge Planning - Adult  Goal: Knowledge of and participation in plan of care  Outcome: Progress within 12 hours     Problem: Fall, at Risk or Actual - Adult  Goal: Absence of falls and fall related injury  Outcome: Progress within 12 hours     Problem: Delirium - Adult / Pediatric  Goal: Absence or resolution of delirium  Outcome: Progress within 12 hours     Problem: Nutrition, Alteration in -Adult  Goal: Adequate nutritional intake  Outcome: Progress within 12 hours  Goal: Maximize nutritional intake per patient condition  Outcome: Progress within 12 hours     Problem: Pressure Injury,At Risk and Actual- Adult  Goal: Absence of any new pressure injury  Outcome: Progress within 12 hours  Goal: Pressure injury healing/stabilization  Outcome: Progress within 12 hours  Goal: Participation in preventative efforts and treatment plan (Patient/Family/Caregiver)  Outcome: Progress within 12 hours  Goal: Adequate nutritional intake  Outcome: Progress within 12 hours

## 2022-05-01 NOTE — Progress Notes (Signed)
[facility] PROGRESS NOTE     This is an independent service.   The available consultant for this service is Kari Baars, MD.           24 Hour Course  -Awaiting MMA and copper levels  -KUB for abdominal distention and pain  -Plan for therapeutic paracentesis 6/21 per patient request  -Consult Palliative Care given complex symptom management and patient reporting struggle with coping    Subjective  Patient reports having worsening abdominal cramping today.  She wants to decline imodium as she feels like she is constipated, however RN reports she is still having frequent diarrhea.  We discussed possible symptoms related to her bowels that may seem like constipation - like distention, bloating, and ascites.  Will get KUB to make sure no obstructive process.   Agreeable to prn hydromorphone.  Reports she is lonely and scared.  We talked about getting Palliative Care involved as a lot of her current problems are related to symptoms and anguish  Reports dry mouth - difficult to eat or drink     Vitals  Temp:  [36.6 C (97.9 F)-37.1 C (98.7 F)] 36.9 C (98.4 F)  Heart Rate:  [89-98] 89  *Resp:  [16-20] 16  BP: (98-109)/(71-78) 98/71  SpO2:  [94 %-100 %] 100 %    Most Recent Weight: 69 kg (152 lb 1.9 oz)  Admission Weight: 66.2 kg (146 lb)      Intake/Output Summary (Last 24 hours) at 05/01/2022 1528  Last data filed at 05/01/2022 1300  Gross per 24 hour   Intake 1550 ml   Output 375 ml   Net 1175 ml       Physical Exam  Gen: NAD, alert and oriented x3,  but slowed mentation  HEENT: EOMI, moist mucus membranes  Resp: no crackles/wheezing  Card: RRR, no murmurs  Gi: soft, tender, distended  Ext: negative BLE edema   Skin: Ecchymosis to BUE  Neuro: no focal deficits  Lines: PIV x1 in place, clean dry & intact dressing    [place]sky Performance Status: 40, Disabled; requires special care and assistance (ECOG equivalent 3)    Scheduled Meds:   0.9% sodium chloride flush  3 mL Intravenous Q12H [place]     cholecalciferol (vitamin D3)  2,000 Units Per G Tube Daily [place]    cyanocobalamin (Vitamin B12)  1,000 mcg Subcutaneous Daily [place]    Followed by    Derrill Memo ON 05/08/2022] cyanocobalamin (Vitamin B12)  1,000 mcg Subcutaneous Q7 Days    ergocalciferol (vitamin D2)  50,000 Units Per G Tube Q7 Days    folic acid  1 mg Per G Tube Daily SCH    furosemide  20 mg Per G Tube Daily [facility]    HYDROmorphone  0.4 mg Intravenous Once    lansoprazole  30 mg Per G Tube Q AM Before Breakfast [facility]    letermovir  480 mg Other Daily [place]    loperamide  4 mg Feeding Tube Q6H SCH    mirtazapine  15 mg Per G Tube Daily At Bedtime [facility]    multivitamin  1 tablet Per G Tube Daily [facility]    ondansetron  8 mg Per G Tube TID    rifAXIMin  550 mg Feeding Tube BID [facility]    spironolactone  50 mg Per G Tube Daily [place]    thiamine mononitrate (vit B1)  100 mg Per G Tube Daily SCH    ursodiol  600 mg Per G Tube BID  [facility]    zinc acetate  25 mg of elemental zinc (Zn) Per G Tube BID [place]     Continuous Infusions:  PRN Meds:   0.9% sodium chloride flush  3 mL Intravenous PRN    acetaminophen  650 mg Per G Tube Q8H PRN    albuterol  2.5 mg Nebulization Q4H PRN    Or    albuterol  2.5 mg Nebulization Q4H PRN    aluminum-magnesium hydroxide-simethicone  15 mL Per G Tube Q4H PRN    HYDROcodone-acetaminophen  1-2 tablet Per G Tube Q6H PRN    HYDROmorphone  0.2-0.4 mg Intravenous Q4H PRN    hydrOXYzine  25 mg Feeding Tube Q4H PRN    opium  6 mg Oral 4x Daily PRN    prochlorperazine  5 mg Intravenous Q8H PRN    Or    prochlorperazine  5 mg Per G Tube Q8H PRN     Data    CBC        05/01/22  0557 04/30/22  0540   WBC 5.5 5.7   HGB 8.0* 8.2*   HCT 26.2* 27.7*   PLT 99* 108*       Coags  No results found in last 36 hours  Chem7        05/01/22  0557 04/30/22  0540   NA 143 143   K 4.7 4.5   CL 115* 115*   CO2 23 23   BUN 31* 31*   CREAT 0.47* 0.46*   GLU 100 97       Electrolytes        05/01/22  0557 04/30/22  0540   CA 8.2* 8.3*   MG 1.8 1.9   PO4 3.1 3.4       Liver Panel         05/01/22  0557 04/30/22  0540   AST 68* 66*   ALT 28 26   ALKP 262* 261*   TBILI 0.8 0.9       Microbiology Results (last 72 hours)       Procedure Component Value Units Date/Time    Bacterial Culture, Normally Sterile Sites, With Gram Stain [[id number]] Collected: 04/23/22 0028    Order Status: Completed Specimen: Not Applicable from Peritoneal Fluid Updated: 05/01/22 1254     Gram Stain No PMNs seen , Rare Host cells (not PMNs, RBCs, or squamous epithelial cells) , Few RBCs      No organisms seen     Bacterial Culture, Sterile Sites No growth 8 days.    Peripheral Blood Culture [[id number]] Collected: 04/23/22 0224    Order Status: Completed Specimen: Peripheral Blood Updated: 04/29/22 0548     Peripheral Blood Culture No growth 6 days.    Peripheral Blood Culture [[id number]] Collected: 04/23/22 0224    Order Status: Completed Specimen: Peripheral Blood Updated: 04/29/22 0548     Peripheral Blood Culture No growth 6 days.    COVID-19 RNA, RT-PCR/Nucleic Acid Amplification Screening (Asymptomatic and No Specific COVID-19 Suspicion); No [[id number]] Collected: 04/28/22 0359    Order Status: Completed Specimen: Not Applicable from Bilateral Anterior Nares +/- OP Swab Updated: 04/28/22 1652     COVID-19 RNA, RT-PCR/Nucleic Acid Amplification Not detected     Comment: (Reported by Lab to Public Health.)  (Results reported to [facility])          Comments See Comment     Comment: Bilateral Anterior nares and OP Swab  in universal transport medium  Radiology Results  No results found.    I spoke with Kari Baars, MD from [facility] regarding A/P.    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   Receiving dexamethasone 12 mg weekly for diarrhea as outpt  10/16/21 - last Daratumumab dose (on hold due to diarrhea)     DATA:   09/07/2010: IgG Kappa MM  12/02/2021: Ig M <5, IgG 447, IgA 20, Ig E <2, SFLC Kappa 16.6, Lambda 1.6, Kappa/Lamda 10.38  12/14/21: IgG K on IFE, IgG 430, KLC 16.6, LLC 2.7, KLR 6.15  02/23/22 SPEP with stable M-spike, SFLC decreased Kappa 44.2 / Lambda 5.5 = K/L ratio = 8.04 (overall downtrending still)  04/23/22 M-spike 0.6, IgG K on IFE, IgG 997, Kappa 70.7/Lambda 19.8 = K/L ratio = 3.57     Chemo:  TBD, currently off therapy due to recurrent infections    Supportive Care:   Transfuse for Hgb <7 and Platelets <10  Access: PIV     Primary Oncologist:   Dr. Jacelyn Grip ([place])  Dr. Iona Coach ([place])    Disposition:  Lives in [place] - anticipate return to home with [place] vs SNF, currently working with PT    # Immunocompromised  Immunocompromised due to underlying malignancy.   Not currently neutropenic. UCx 6/10 w/ 100,000CFU enterococcus & C. albicans. Not currently having any sx of dysuria, previous UCx w/ C. albicans. Will defer treatment as asymptomatic sample likely contaminated w/ skin flora.     # Diarrhea  # c/b CMV Viremia  # Hx prolonged norovirus infection  -Extended hospitalization at both [place] 1/23-04/16/22, with hypovolemic shock associated with norovirus, acute liver failure associated with pomylast, CMV colitis.   08/2021 - first +norovirus with persistent +norovirus s/p 7-day courses x 2 nitazoxanide (2/16-02/22/23 & 01/2022), but norovirus continued to be positive (most recently 04/07/22)- subsequent clearance of norovirus per test 6/9,   -Regarding CMV colitis, 12/17/21 flex sig showed CMV colitis, sp multiple cycles of gancyclovir IV, IVIG, valcyte 900 mg PO BID, then ultimately  transitioned to letermovir, which she continues. Her CMV (iu/mL) 443,961--> 179,555--> 74 (2/22) --> 176 (3/1) --> 105 (6/9)  -At home was taking loperamide BID and diarrhea was controlled to 2-3 times daily. Diarrhea had improved to 1 stool on 6/11, but worsened on 6/12 (11 stools). Possibly r/t to inadequate dosing of loperamdie vs infectious etiology. GI c/s 6/13 for possible scope - per patient and family declined endoscopy in favor of supportive medical interventions and to revisit endoscopy if diarrhea doesn't improve.    6/14 - Given CMV stable and norovirus negative and enteric panel unrevealing - favor non-infectious etiologies for diarrhea    Data:  6/9: C. Diff neg, GI bacterial/viral/parasite panels neg, norovirus neg    Plan  -Continue letermovir daily (now per G tube 6/16- )  -Appreciate GI consult - last seen 6/13  -Defer endoscopy upon discussion with patient and family -  revisit if medical mgmt ineffective, but currently back to baseline (2-3 stools in 24 hours) per patient and daughter  - Continue loperamide q6h ATC  - Continue tincture of opium QID PRN   - Defer ID consultation in absence of new ID findings on enteric workup     # Acute liver failure, now requiring weekly paracentesis  # Pericentral/sinusioidal and periportal fibrosis, ductal reaction with focal pericholangitis from vanishing bile duct in the setting of pomalidomide.   # Hepatic Encephalopathy  Diagnostic paracentesis negative for SBP, currently declining therapeutic para and abdomen soft. Malnutrition may also be contributing to ascites (see below).   -Continue spironolactone ('50mg'$ ) / lasix ('20mg'$ ) to minimize accumulation of ascites (6/16- )  -Continue Ursodiol 600 mg PO BID  -Continue rifaximin 550 mg PO QD  -Continue midodrine 10 mg PO TID  -Low sodium diet  -2L fluid restriction  -Defer scheduled paracentesis in favor of prn discomfort  -Follow up KUB 6/17 c/o abdominal pain     # Failure to thrive  # PEG tube dependence  #  Macrocytic anemia - B12 deficiency  PEG tube placed during last admission.  -Continue TF - Nutren 1.5 - goal 35m/hr  -Continue cyanocobalamin 10074m SQ daily x 7 days (6/17-6/23) , then weekly (6/24- )  -Follow up MMA  -Follow up [place] level     # Dysphagia  Dysphagia and aspiration risk assessed by speech language pathology. SLP recommending Modified Barium Swallow test to objectively measure swallow physiology to best support diet recommendations and supportive strategies for reduce aspiration risk. Patient and daughter agreeable today 04/30/22.  - MBSS eval 6/20  - See failure to thrive section as well re: PEG tube and nutritional deficiencies    # Pain  # Abdominal cramping   # mental anguish  -Consult Palliative Care 6/17 for SMS    # Physical Deconditioning  -PT/OT - recommend placement SNF     VTE PPx:  Contraindicated: Platelets <= 50 (or around 50)     Nutritional Assessment  Severe Chronic disease or condition related malnutrition    During this hospitalization the patient is also being treated for:  - Anemia associated with malignancy (present on admission)    - Thrombocytopenia (present on admission)    - Coagulopathy (present on admission)    - Malignancy associated fatigue on admission  - Neoplasm/malignancy associated pain  - Hypoalbuminemia (present on admission)  - HYPOnatremia (present on admission)  - Fluid status: Volume overload on admission  - Cachexia    Code Status: FULL    CaGardiner RhymeNPLeola Hospitaldentification #:  [id number] 05/01/22     I spent a total time of 50 minutes in this episode of care preparing to see the patient, obtaining and/or reviewing separately obtained history, performing a medically appropriate examination and/or evaluation, counseling and educating patient/family/caregiver, ordering and reviewing medications, tests, or procedures, referring and communicating with other health care professionals, documenting clinical information in the electronic or other health record,  and communicating results to the patient/family/caregiver, and/or other care coordination.

## 2022-05-02 LAB — COMPLETE BLOOD COUNT WITH DIFFERENTIAL
Abs Basophils: 0.02 10*9/L (ref 0.00–0.10)
Abs Eosinophils: 0.04 10*9/L (ref 0.00–0.40)
Abs Imm Granulocytes: 0.17 10*9/L — ABNORMAL HIGH (ref <0.10)
Abs Lymphocytes: 0.83 10*9/L — ABNORMAL LOW (ref 1.00–3.40)
Abs Monocytes: 0.54 10*9/L (ref 0.20–0.80)
Abs Neutrophils: 2.88 10*9/L (ref 1.80–6.80)
Hematocrit: 25.4 % — ABNORMAL LOW (ref 36.0–46.0)
Hemoglobin: 7.8 g/dL — ABNORMAL LOW (ref 12.0–15.5)
MCH: 34.4 pg — ABNORMAL HIGH (ref 26.0–34.0)
MCHC: 30.7 g/dL — ABNORMAL LOW (ref 31.0–36.0)
MCV: 112 fL — ABNORMAL HIGH (ref 80–100)
MPV: 12.5 fL (ref 9.1–12.6)
Platelet Count: 93 10*9/L — ABNORMAL LOW (ref 140–450)
RBC Count: 2.27 10*12/L — ABNORMAL LOW (ref 4.00–5.20)
RDW-CV: 17.9 % — ABNORMAL HIGH (ref 11.7–14.4)
WBC Count: 4.5 10*9/L (ref 3.4–10.0)

## 2022-05-02 LAB — BASIC METABOLIC PANEL (NA, K, CL, CO2, BUN, CR, GLU, CA)
Anion Gap: 6 (ref 4–14)
Calcium, total, Serum / Plasma: 8.1 mg/dL — ABNORMAL LOW (ref 8.4–10.5)
Carbon Dioxide, Total: 23 mmol/L (ref 22–29)
Chloride, Serum / Plasma: 113 mmol/L — ABNORMAL HIGH (ref 101–110)
Creatinine: 0.53 mg/dL — ABNORMAL LOW (ref 0.55–1.02)
Glucose, non-fasting: 102 mg/dL (ref 70–199)
Potassium, Serum / Plasma: 4.7 mmol/L (ref 3.5–5.0)
Sodium, Serum / Plasma: 142 mmol/L (ref 135–145)
Urea Nitrogen, Serum / Plasma: 32 mg/dL — ABNORMAL HIGH (ref 7–25)
eGFRcr: 105 mL/min/{1.73_m2} (ref >59)

## 2022-05-02 LAB — METHYLMALONIC ACID, SERUM: MMA, Serum: 0.32 umol/L (ref 0.00–0.40)

## 2022-05-02 LAB — ALKALINE PHOSPHATASE: Alkaline Phosphatase: 230 U/L — ABNORMAL HIGH (ref 38–108)

## 2022-05-02 LAB — PHOSPHORUS, SERUM / PLASMA: Phosphorus, Serum / Plasma: 3.5 mg/dL (ref 2.3–4.7)

## 2022-05-02 LAB — ASPARTATE TRANSAMINASE: AST: 59 U/L — ABNORMAL HIGH (ref 5–44)

## 2022-05-02 LAB — MAGNESIUM, SERUM / PLASMA: Magnesium, Serum / Plasma: 1.7 mg/dL (ref 1.6–2.6)

## 2022-05-02 LAB — GAMMA-GLUTAMYL TRANSPEPTIDASE: Gamma-Glutamyl Transpeptidase: 107 U/L — ABNORMAL HIGH (ref 8–59)

## 2022-05-02 LAB — BILIRUBIN, TOTAL: Bilirubin, Total: 0.7 mg/dL (ref 0.2–1.2)

## 2022-05-02 LAB — ALANINE TRANSAMINASE: Alanine transaminase: 25 U/L (ref 10–61)

## 2022-05-02 MED ORDER — MIRTAZAPINE 30 MG TABLET
30 | Freq: Every day | ORAL | Status: DC
Start: 2022-05-02 — End: 2022-05-27
  Administered 2022-05-03 – 2022-05-05 (×3): via GASTROSTOMY
  Administered 2022-05-06: 04:00:00 30 mg via GASTROSTOMY
  Administered 2022-05-06 – 2022-05-12 (×6): via GASTROSTOMY
  Administered 2022-05-13: 03:00:00 30 mg via GASTROSTOMY
  Administered 2022-05-14 – 2022-05-15 (×2): via GASTROSTOMY
  Administered 2022-05-16 – 2022-05-26 (×11): 30 mg via GASTROSTOMY
  Administered 2022-05-27: 04:00:00 via GASTROSTOMY

## 2022-05-02 MED FILL — URSODIOL (BULK) 100 % POWDER: 100 % | Qty: 0.6

## 2022-05-02 MED FILL — ONDANSETRON HCL 4 MG TABLET: 4 mg | ORAL | Qty: 2

## 2022-05-02 MED FILL — XIFAXAN 550 MG TABLET: 550 mg | ORAL | Qty: 1

## 2022-05-02 MED FILL — FOLIC ACID 1 MG TABLET: 1 mg | ORAL | Qty: 1

## 2022-05-02 MED FILL — ZINC ACETATE DIHYDRATE (BULK) 100 % CRYSTALS: 100 % | Qty: 0.08

## 2022-05-02 MED FILL — PREVYMIS 480 MG TABLET: 480 mg | ORAL | Qty: 1

## 2022-05-02 MED FILL — HYDROXYZINE HCL 25 MG TABLET: 25 mg | ORAL | Qty: 1

## 2022-05-02 MED FILL — CHOLECALCIFEROL (VITAMIN D3) 25 MCG (1,000 UNIT) TABLET: 1000 UNITS | ORAL | Qty: 2

## 2022-05-02 MED FILL — HYDROCODONE 5 MG-ACETAMINOPHEN 325 MG TABLET: 5-325 mg | ORAL | Qty: 1

## 2022-05-02 MED FILL — ANTI-DIARRHEAL (LOPERAMIDE) 2 MG TABLET: 2 mg | ORAL | Qty: 2

## 2022-05-02 MED FILL — ERGOCALCIFEROL (VITAMIN D2) 200 MCG/ML (8,000 UNIT/ML) ORAL DROPS: 200 mcg/mL (8,000 unit/mL) | ORAL | Qty: 6.25

## 2022-05-02 MED FILL — LANSOPRAZOLE 3 MG/ML ORAL SUSPENSION COMPOUNDING KIT: 3 mg/mL | ORAL | Qty: 10

## 2022-05-02 MED FILL — MIRTAZAPINE 15 MG TABLET: 15 mg | ORAL | Qty: 1

## 2022-05-02 MED FILL — FUROSEMIDE 20 MG TABLET: 20 mg | ORAL | Qty: 1

## 2022-05-02 MED FILL — THIAMINE MONONITRATE (VITAMIN B1) 100 MG TABLET: 100 mg | ORAL | Qty: 1

## 2022-05-02 MED FILL — PROCHLORPERAZINE EDISYLATE 10 MG/2 ML (5 MG/ML) INJECTION SOLUTION: 10 mg/2 mL (5 mg/mL) | INTRAMUSCULAR | Qty: 2

## 2022-05-02 MED FILL — DILAUDID (PF) 0.5 MG/0.5 ML INJECTION SYRINGE: 0.5 mg/ mL | INTRAMUSCULAR | Qty: 0.5

## 2022-05-02 MED FILL — TAB-A-VITE 400 MCG TABLET: 400 mcg | ORAL | Qty: 1

## 2022-05-02 MED FILL — SPIRONOLACTONE 25 MG TABLET: 25 mg | ORAL | Qty: 2

## 2022-05-02 NOTE — Assessment & Plan Note (Signed)
Not discussed at length during our initial visit today as patient reports that it is currently well controlled with her home norco prn and IV dilaudid prn.    Recommendations:  - Continue norco, dilaudid prn

## 2022-05-02 NOTE — Plan of Care (Signed)
Problem: Discharge Planning - Adult  Goal: Knowledge of and participation in plan of care  Outcome: Progress within 12 hours     Problem: Fall, at Risk or Actual - Adult  Goal: Absence of falls and fall related injury  Outcome: Progress within 12 hours     Problem: Delirium - Adult / Pediatric  Goal: Absence or resolution of delirium  Outcome: Progress within 12 hours     Problem: Nutrition, Alteration in -Adult  Goal: Adequate nutritional intake  Outcome: Progress within 12 hours  Goal: Maximize nutritional intake per patient condition  Outcome: Progress within 12 hours     Problem: Pressure Injury,At Risk and Actual- Adult  Goal: Absence of any new pressure injury  Outcome: Progress within 12 hours  Goal: Pressure injury healing/stabilization  Outcome: Progress within 12 hours  Goal: Participation in preventative efforts and treatment plan (Patient/Family/Caregiver)  Outcome: Progress within 12 hours  Goal: Adequate nutritional intake  Outcome: Progress within 12 hours

## 2022-05-02 NOTE — Progress Notes (Signed)
[facility] HOSPITALIST PROGRESS NOTE  ATTENDING ONLY     My date of service is 05/02/22.    24 Hour Course  Pall care consulted for existential pain.  KUB ordered given abdominal distension.     Subjective  Feels very cold; noted that fan was blowing directly at patient and blanket was off so addressed these during interview. Also complaining of mouth discomfort and overall poor appetite.    Vitals  Temp:  [36.4 C (97.6 F)-37.5 C (99.5 F)] 37.5 C (99.5 F)  Heart Rate:  [96-100] 96  *Resp:  [16-20] 16  BP: (104-126)/(74-82) 110/74  SpO2:  [99 %-100 %] 99 %    MostRecent Weight: 69 kg (152 lb 1.9 oz)  Admission Weight: 66.2 kg (146 lb)      Intake/Output Summary (Last 24 hours) at 05/02/2022 1433  Last data filed at 05/02/2022 1152  Gross per 24 hour   Intake 990 ml   Output 500 ml   Net 490 ml     Pain Score: 0    Physical Exam  Gen: NAD, alert and oriented x3,  but slowed mentation  HEENT: EOMI, moist mucus membranes  Resp: no crackles/wheezing  Card: RRR, no murmurs  Gi: soft, tender, distended  Ext: negative BLE edema   Skin: Ecchymosis to BUE  Neuro: no focal deficits  Lines: PIV x1 in place, clean dry & intact dressing    Scheduled Meds:   0.9% sodium chloride flush  3 mL Intravenous Q12H [place]    cholecalciferol (vitamin D3)  2,000 Units Per G Tube Daily [place]    cyanocobalamin (Vitamin B12)  1,000 mcg Subcutaneous Daily [place]    Followed by    Derrill Memo ON 05/08/2022] cyanocobalamin (Vitamin B12)  1,000 mcg Subcutaneous Q7 Days    ergocalciferol (vitamin D2)  50,000 Units Per G Tube Q7 Days    folic acid  1 mg Per G Tube Daily [place]    furosemide  20 mg Per G Tube Daily [facility]    HYDROmorphone  0.4 mg Intravenous Once    lansoprazole  30 mg Per G Tube Q AM Before Breakfast [facility]    letermovir  480 mg Other Daily [place]    loperamide  4 mg Feeding Tube Q6H [facility]    mirtazapine  15 mg Per G Tube Daily At Bedtime [facility]    multivitamin  1 tablet Per G Tube Daily [facility]    ondansetron  8 mg Per G Tube TID    rifAXIMin  550 mg  Feeding Tube BID [facility]    spironolactone  50 mg Per G Tube Daily [place]    thiamine mononitrate (vit B1)  100 mg Per G Tube Daily [place]    ursodiol  600 mg Per G Tube BID [facility]    zinc acetate  25 mg of elemental zinc (Zn) Per G Tube BID [place]     Continuous Infusions:  PRN Meds:   0.9% sodium chloride flush  3 mL Intravenous PRN    acetaminophen  650 mg Per G Tube Q8H PRN    albuterol  2.5 mg Nebulization Q4H PRN    Or    albuterol  2.5 mg Nebulization Q4H PRN    aluminum-magnesium hydroxide-simethicone  15 mL Per G Tube Q4H PRN    HYDROcodone-acetaminophen  1-2 tablet Per G Tube Q6H PRN    HYDROmorphone  0.2-0.4 mg Intravenous Q4H PRN    hydrOXYzine  25 mg Feeding Tube Q4H PRN    opium  6 mg Oral 4x Daily PRN    prochlorperazine  5 mg Intravenous Q8H PRN    Or    prochlorperazine  5 mg Per G Tube Q8H PRN       Data    CBC        05/02/22  0552 05/01/22  0557   WBC 4.5 5.5   HGB 7.8* 8.0*   HCT 25.4* 26.2*   PLT 93* 99*     Coags  No results found in last 36 hours  Chem7        05/02/22  0552 05/01/22  0557   NA 142 143   K 4.7 4.7   CL 113* 115*   CO2 23 23   BUN 32* 31*   CREAT 0.53* 0.47*   GLU 102 100     Electrolytes        05/02/22  0552 05/01/22  0557   CA 8.1* 8.2*   MG 1.7 1.8   PO4 3.5 3.1     Liver Panel        05/02/22  0552 05/01/22  0557   AST 59* 68*   ALT 25 28   ALKP 230* 262*   TBILI 0.7 0.8     UA  No results found in last 7 days  Microscopy:  No results found in last 7 days    Microbiology Results (last 72 hours)       Procedure Component Value Units Date/Time    Bacterial Culture, Normally Sterile Sites, With Gram Stain [[id number]] Collected: 04/23/22 0028    Order Status: Completed Specimen: Not Applicable from Peritoneal Fluid Updated: 05/01/22 1254     Gram Stain No PMNs seen , Rare Host cells (not PMNs, RBCs, or squamous epithelial cells) , Few RBCs      No organisms seen     Bacterial Culture, Sterile Sites No growth 8 days.            Radiology Results  No results found.    I discussed the patient  with Emi Holes, MD from [facility] regarding A&P.    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   Receiving dexamethasone 12 mg weekly for diarrhea as outpt  10/16/21 - last Daratumumab dose (on hold due to diarrhea)     DATA:   09/07/2010: IgG Kappa MM  12/02/2021: Ig M <5, IgG 447, IgA 20, Ig E <2, SFLC Kappa 16.6, Lambda 1.6, Kappa/Lamda 10.38  12/14/21: IgG K on IFE, IgG 430, KLC 16.6, LLC 2.7, KLR 6.15  02/23/22 SPEP with stable M-spike, SFLC decreased Kappa 44.2 / Lambda 5.5 = K/L ratio = 8.04 (overall downtrending still)  04/23/22 M-spike 0.6, IgG K on IFE, IgG 997, Kappa 70.7/Lambda 19.8 = K/L ratio = 3.57     Chemo:  TBD, currently off therapy due to recurrent infections     Supportive Care:   Transfuse for Hgb <7  and Platelets <10  Access: PIV; consider PICC placement to manage pain of blood draws     Primary Oncologist:   Dr. Jacelyn Grip ([place])  Dr. Iona Coach ([place])     Disposition:  Lives in [place] - anticipate return to home with [place] vs SNF, currently working with PT     # Immunocompromised  Immunocompromised due to underlying malignancy.   Not currently neutropenic. UCx 6/10 w/ 100,000CFU enterococcus & C. albicans. Not currently having any sx of dysuria, previous UCx w/ C. albicans. Will defer treatment as asymptomatic sample likely contaminated w/ skin flora.      # Diarrhea  # c/b CMV Viremia  # Hx prolonged norovirus infection  -Extended hospitalization at both [place] 1/23-04/16/22, with  hypovolemic shock associated with norovirus, acute liver failure associated with pomylast, CMV colitis.   08/2021 - first +norovirus with persistent +norovirus s/p 7-day courses x 2 nitazoxanide (2/16-02/22/23 & 01/2022), but norovirus continued to be positive (most recently 04/07/22)- subsequent clearance of norovirus per test 6/9,   -Regarding CMV colitis, 12/17/21 flex sig showed CMV colitis, sp multiple cycles of gancyclovir IV, IVIG, valcyte 900 mg PO BID, then ultimately transitioned to letermovir, which she continues. Her CMV (iu/mL) 443,961--> 179,555--> 74 (2/22) --> 176 (3/1) --> 105 (6/9)  -At home was taking loperamide BID and diarrhea was controlled to 2-3 times daily. Diarrhea had improved to 1 stool on 6/11, but worsened on 6/12 (11 stools). Possibly r/t to inadequate dosing of loperamdie vs infectious etiology. GI c/s 6/13 for possible scope - per patient and family declined endoscopy in favor of supportive medical interventions and to revisit endoscopy if diarrhea doesn't improve.    6/14 - Given CMV stable and norovirus negative and enteric panel unrevealing - favor non-infectious etiologies for diarrhea     Data:  6/9: C. Diff neg, GI bacterial/viral/parasite panels neg, norovirus neg     Plan  -Continue letermovir daily (now per G tube 6/16- )  -Appreciate GI consult - last seen 6/13  -Defer endoscopy upon discussion with patient and family - revisit if medical mgmt ineffective, but currently back to baseline (2-3 stools in 24 hours) per patient and daughter  - Continue loperamide q6h ATC  - Continue tincture of opium QID PRN   - Defer ID consultation in absence of new ID findings on enteric workup      # Acute liver failure, now requiring weekly paracentesis  # Pericentral/sinusioidal and periportal fibrosis, ductal reaction with focal pericholangitis from vanishing bile duct in the setting of pomalidomide.   # Hepatic Encephalopathy  Diagnostic paracentesis negative for SBP, currently declining  therapeutic para and abdomen soft. Malnutrition may also be contributing to ascites (see below).   -Continue spironolactone ('50mg'$ ) / lasix ('20mg'$ ) to minimize accumulation of ascites (6/16- )  -Continue Ursodiol 600 mg PO BID  -Continue rifaximin 550 mg PO QD  -Continue midodrine 10 mg PO TID  -Low sodium diet  -2L fluid restriction  - Consider therapeutic para on 6/20 (delayed due to holiday)  - 6/17 KUB shows mild ileus and density over the abdomen (likely ascitic fluid)     # Failure to thrive  # PEG tube dependence  # Macrocytic anemia - B12 deficiency  PEG tube placed during last admission.  -Continue TF - Nutren 1.5 - goal 92m/hr  -Continue cyanocobalamin 10032m SQ daily x 7 days (6/17-6/23) , then weekly (6/24- )  - 6/14 MMA 0.32 (WNL)  -Follow up [place] level     #  Dysphagia  Dysphagia and aspiration risk assessed by speech language pathology. SLP recommending Modified Barium Swallow test to objectively measure swallow physiology to best support diet recommendations and supportive strategies for reduce aspiration risk. Patient and daughter agreeable today 04/30/22.  - MBSS eval 6/20  - See failure to thrive section as well re: PEG tube and nutritional deficiencies     # Pain  # Abdominal cramping   # Mental anguish  -Consulted Palliative Care 6/17 for SMS     # Physical Deconditioning  -PT/OT - recommend placement SNF    VTE PPx:  Contraindicated: Platelets <= 50 or around 50    Severity of Illness  Stable    Nutritional Assessment  Severe Chronic disease or condition related malnutrition    During this hospitalization the patient is also being treated for:  - Anemia associated with malignancy (present on admission)    - Thrombocytopenia (present on admission)    - Coagulopathy (present on admission)    - Malignancy associated fatigue on admission  - Neoplasm/malignancy associated pain  - Hypoalbuminemia (present on admission)  - HYPOnatremia (present on admission)  - Fluid status: Volume overload on  admission  - Cachexia    Code Status: FULL    [name], MD  05/02/22

## 2022-05-02 NOTE — Consults (Signed)
PALLIATIVE CARE INITIAL CONSULT NOTE - ATTENDING ONLY     My date of service is 05/02/2022.    Consult requested by Dr. Jeannine Kitten of the Malignant Hematology service.    Reasons given by referring provider for initial PC consult (check all that apply): Pain management, Other symptom management, and Support for patient/family    Primary diagnosis leading to [facility] consult (check one): Cancer (Heme)    Surrogate decision maker: Identified as:   Name: Ariel Braun  Relationship to patient: Daughter  Surrogate is aware that they are designated surrogate? Yes       History of Present Illness  Ariel Braun is a 61F with MM, chronic diarrhea (CMV colitis vs norovirus infection), acute liver injury, admitted for workup of persistent diarrhea and FTT.  Palliative care was consulted for assistance with pain management, other symptom mangement, and patient/family support.  I met with the patient and her duaghter Ariel Braun and son Ariel Braun at bedside this afternoon.  See A&P for a summary of our conversation.      Symptom scores:   Pain: 0  Anxiety: 4  Nausea/vomiting:0  Dyspnea: 0  Colostomy output:        Palliative Performance Scale (PPS) at time of consult: 40% - Mainly in bed, Unable to do most activity, Extensive disease, Mainly assistance, Normal or reduced intake, Full or drowsy +/- confusion concious level    Screening and interventions (check all that apply):  Screening: Pain: Positive  Non-pain symptoms: Positive  Psychosocial needs: Positive  Spiritual needs:  Positive  Goals of care/Adv. care planning needs:  Positive    Intervened: Pain, Non-pain symptoms, Psychosocial needs, Spiritual needs, and Goals of care/Adv. care planning needs      Past Medical History:   Diagnosis Date    Acid reflux disease     Anxiety     Depression     Hereditary angioedema (CMS code)     Multiple myeloma, without mention of having achieved remission     IgG kappa    Myeloma (CMS code) 05/10/2011     Past Surgical History:   Procedure Laterality Date     AUTOLOGOUS STEM CELL TRANSPLANTATION  05/12/11    BREAST CYST EXCISION  1981       Allergies: Diphenoxylate-atropine and Caffeine     Scheduled Meds:   0.9% sodium chloride flush  3 mL Intravenous Q12H [place]    cholecalciferol (vitamin D3)  2,000 Units Per G Tube Daily [place]    cyanocobalamin (Vitamin B12)  1,000 mcg Subcutaneous Daily [place]    Followed by    Derrill Memo ON 05/08/2022] cyanocobalamin (Vitamin B12)  1,000 mcg Subcutaneous Q7 Days    ergocalciferol (vitamin D2)  50,000 Units Per G Tube Q7 Days    folic acid  1 mg Per G Tube Daily [facility]    furosemide  20 mg Per G Tube Daily [facility]    HYDROmorphone  0.4 mg Intravenous Once    lansoprazole  30 mg Per G Tube Q AM Before Breakfast [facility]    letermovir  480 mg Other Daily [place]    loperamide  4 mg Feeding Tube Q6H [facility]    mirtazapine  15 mg Per G Tube Daily At Bedtime [facility]    multivitamin  1 tablet Per G Tube Daily [facility]    ondansetron  8 mg Per G Tube TID    rifAXIMin  550 mg Feeding Tube BID [facility]    spironolactone  50 mg Per G Tube Daily [facility]  thiamine mononitrate (vit B1)  100 mg Per G Tube Daily [place]    ursodiol  600 mg Per G Tube BID [facility]    zinc acetate  25 mg of elemental zinc (Zn) Per G Tube BID [place]     Continuous Infusions:  PRNMeds:   0.9% sodium chloride flush  3 mL Intravenous PRN    acetaminophen  650 mg Per G Tube Q8H PRN    albuterol  2.5 mg Nebulization Q4H PRN    Or    albuterol  2.5 mg Nebulization Q4H PRN    aluminum-magnesium hydroxide-simethicone  15 mL Per G Tube Q4H PRN    HYDROcodone-acetaminophen  1-2 tablet Per G Tube Q6H PRN    HYDROmorphone  0.2-0.4 mg Intravenous Q4H PRN    hydrOXYzine  25 mg Feeding Tube Q4H PRN    opium  6 mg Oral 4x Daily PRN    prochlorperazine  5 mg Intravenous Q8H PRN    Or    prochlorperazine  5 mg Per G Tube Q8H PRN       Was the patient or family screened for psychosocial needs? Yes - Screen was completed    Social History     Socioeconomic History    Marital status: Married   Tobacco Use    Smoking status: Unknown   Substance  and Sexual Activity    Sexual activity: Not Currently       Primary Caregiver: Child/Child-in-law    Was the patient or family screened for spiritual care needs? Yes - Screen was completed    Spiritual screen: "What have you turned to in difficult times such as nature, prayer, family, God, writing, etc., and are you able to turn to this now?"  If screening done, patient/family response: Faith is huge source of strength from patient and she greatly benefits from prayer.      Family History was reviewed and is non-contributory to this illness.    Vitals  Temp:  [36.4 C (97.6 F)-37.5 C (99.5 F)] 37.5 C (99.5 F)  Heart Rate:  [96-100] 96  *Resp:  [16-20] 16  BP: (104-126)/(74-82) 110/74  SpO2:  [99 %-100 %] 99 %      Intake/Output Summary (Last 24 hours) at 05/02/2022 1528  Last data filed at 05/02/2022 1152  Gross per 24 hour   Intake 1040 ml   Output 500 ml   Net 540 ml       Pain Score: 0    Physical Exam  Gen: Lying in bed in NAD  HEENT: NCAT, MMM, sclera anicteric  Pulm: Normal work of breathing, no respiratory distress  Skin: No visible lesions  Neuro: Alert, interactive  Psych: Depressed mood and affect    Data    CBC        05/02/22  0552   WBC 4.5   HGB 7.8*   HCT 25.4*   PLT 93*       Chem7        05/02/22  0552   NA 142   K 4.7   CL 113*   CO2 23   BUN 32*   CREAT 0.53*   GLU 102        XR KUB, Flat Plate, Abdomen 1 View    Result Date: 05/02/2022  Lines/drains/medical devices: Rounded density projects over the stomach. Gaseous distention of the stomach, few loops of small bowel and transverse colon consistent with mild ileus. Multiple sclerotic osseous lesions, better characterized on prior CT and consistent  with multiple myeloma. Report dictated by: Arnaldo Natal, MD, signed by: Arnaldo Natal, MD Department of Radiology and Biomedical Imaging       Assessment and Recommendations    CHRYSA RAMPY is a 61 y.o. female with MM, chronic diarrhea (CMV colitis vs norovirus infection), acute liver injury, admitted for  workup of persistent diarrhea and FTT.  Palliative care was consulted for assistance with pain management, other symptom mangement, and patient/family support.     Counseling regarding advanced care planning and goals of care  Assessment & Plan  Advance Care Planning   Date: 05/02/2022  Goals of Care and Treatment Preferences Discussion With patient, daughter/surrogate Ariel Braun, son Ariel Braun .  Goals of care were confirmed as: Life prolongation - Non-curative  Treatment preferences were addressed as follows: FULL CODE  Surrogate decision maker: Ariel Braun (daughter) Phone Number:   Other treatment preferences discussed: Lives in [place] with her husband, daughter Ariel Braun, and son Ariel Braun (who has developmental delays).  Says her children are her primary motivation for continuing to push forward despite the many setbacks in her care.  Wants to be there to help take care of them and to spend as much time with them as possible.  Says that she will "never give up" even though the disease and its treatment are taking a significant toll on her physically and emotionally.  Gets strength from her faith and finds a lot of benefit with prayer.  Loves to crochet, do beadwork, and spend time by the [place] [facility] is her favorite place to visit).  Ariel Braun and Ariel Braun are staying locally in an AirBnB to support their mother.  Ariel Braun notes that her mother seems to be doing better the past few days and that she hopes that they are "past the worst of it."    Recommendations:  - We will continue to follow for support.  Please let us know if any family meetings are planned as we would be happy to participate           Pain  Assessment & Plan  Not discussed at length during our initial visit today as patient reports that it is currently well controlled with her home norco prn and IV dilaudid prn.    Recommendations:  - Continue norco, dilaudid prn    Anxiety  Assessment & Plan  Acute on chronic anxiety exacerbated by hospitalization and  recent clinical decline.  Currently taking mirtazapine 70m at bedtime and atarax prn.  Finds these medications helpful and is open to increasing mirtazapine to see if a higher dose provdies improved relief.  Also finds prayer helpful.  Says it's taken her a while to be comfortable taking medications for her mood but that she is glad that she uses medications rather than "using drugs to cope" like her siblings have chosen to do.    Recommendations:  - Increase mirtazapine from 134mto 308mt bedtime  - Continue atarax prn  - We will continue to follow for support and to explore other non-pharm strategies for managing anxiety        Outpatient Palliative Care Services: To be determined.    A family meeting was held today: No    Were goals of care discussed? Yes    In what manner was this visit conducted? In-person    Other interventions: None    Code Status: FULL      Counseling / Coordination of Care  I discussedwith the patient and/or family:  Goals of care  Education and  support  Treatment for pain and symptom management        I spent a total time of 60 minutes in this episode of care preparing to see the patient, obtaining and/or reviewing separately obtained history, performing a medically appropriate examination and/or evaluation, counseling and educating patient/family/caregiver, ordering and reviewing medications, tests, or procedures, referring and communicating with other health care professionals, documenting clinical information in the electronic or other health record, and communicating results to the patient/family/caregiver, and/or other care coordination.      Rico Sheehan, MD  05/02/2022

## 2022-05-02 NOTE — Assessment & Plan Note (Addendum)
Acute on chronic anxiety exacerbated by hospitalization and recent clinical decline.  Currently taking mirtazapine '15mg'$  at bedtime and atarax prn.  Finds these medications helpful and is open to increasing mirtazapine to see if a higher dose provdies improved relief.  Also finds prayer helpful.  Says it's taken her a while to be comfortable taking medications for her mood but that she is glad that she uses medications rather than "using drugs to cope" like her siblings have chosen to do.    Recommendations:  - Increase mirtazapine from '15mg'$  to '30mg'$  at bedtime  - Continue atarax prn  - We will continue to follow for support and to explore other non-pharm strategies for managing anxiety

## 2022-05-02 NOTE — Interdisciplinary (Signed)
Spiritual Care Services    Patient/Family Declined Visit: Chaplain visited patient and patient's child(ren) at bedside. Introduced self and Investment banker, operational. Advised that chaplains are available 24/7 by request to bedside nurse. Patient/family declined the visit and patient/family requests a visit at another time. Chaplain has made a note of patient's/family's preferences.    Spiritual Care Provider:     Elmer Bales, PhD  [place] Resident  [facility]  ______________________________________________________________________  Spiritual care is available 24/7. To reach the chaplain on call at any time:  [place] or call [place] or call [place] or call [phone]

## 2022-05-02 NOTE — Interdisciplinary (Signed)
CASE MANAGEMENT ASSESSMENT     CM ASSESSMENT (most recent)       CM Adult Assessment - 05/02/22 1535          Screening    Any anticipated barriers to discharge? No     Does patient have any discharge planning needs? Yes        Adult Assessment    Transfer from outside facility No     Does the Patient have a PCP? Yes     PCP name and contact information Eldridge Scot, DO  [phone]        Caregiver and Family Support    CM Assessment completed with daughter, [name]     Patient/Parent/Legal Guardian is Decision Maker Yes        Living Arrangements and Services    *Who does the patient live with? Other Family Members     *Patient's Current Residence Apartment/ House     Address Type Permanent     Residential Access Stairs/Steps (specify #)   3 STE, FOS to bedroom    *Support Systems Other family members     *Current Services in Place DME;Home health care (specify)     Current DME Hospital bed        Functional Status    Prior ADLs Needs Assistance     Current ADLs Needs Assistance     Prior IADLs Needs Assistance     Current IADLs Needs Assistance     Special Learning Needs Vision loss        Proposed Discharge Plan    Proposed Discharge Plan SNF     Anticipated Skilled or Acute needs Physical Therapy;Occupational Therapy;Nursing     Planned Transportation Arrangements Case Management/Social Work arranged transportation                   [place] called patient's room phone and daughter, [name] answered phone. CM completed assessment with daughter.     Patient lives at home with family. Patient recently discharged from [facility] and was home ~1 week before being admitted to the hospital.   Patient has a hospital bed [facility] which is set up in the home's main floor.     Family is agreeable to SNF placement and would prefer a facility closer to home (as far as [facility] or [facility] is okay).     CM initiated SNF referral in [place].     Loa Socks, MSN, RN Case Manager  Covering 05/02/22  only  Desk ext. [phone]/[place] ext. [phone]

## 2022-05-02 NOTE — Assessment & Plan Note (Addendum)
Advance Care Planning   Date: 05/02/2022  Goals of Care and Treatment Preferences Discussion With patient, daughter/surrogate [name], son Frederico Hamman .  Goals of care were confirmed as: Life prolongation - Non-curative  Treatment preferences were addressed as follows: FULL CODE  Surrogate decision maker: [name] (daughter) Phone Number:   Other treatment preferences discussed: Lives in [place] with her husband, daughter [name], and son Frederico Hamman (who has developmental delays).  Says her children are her primary motivation for continuing to push forward despite the many setbacks in her care.  Wants to be there to help take care of them and to spend as much time with them as possible.  Says that she will "never give up" even though the disease and its treatment are taking a significant toll on her physically and emotionally.  Gets strength from her faith and finds a lot of benefit with prayer.  Loves to crochet, do beadwork, and spend time by the [place] [facility] is her favorite place to visit).  [name] and Frederico Hamman are staying locally in an AirBnB to support their mother.  [name] notes that her mother seems to be doing better the past few days and that she hopes that they are "past the worst of it."    Recommendations:  - We will continue to follow for support.  Please let us know if any family meetings are planned as we would be happy to participate

## 2022-05-03 LAB — BILIRUBIN, TOTAL: Bilirubin, Total: 0.7 mg/dL (ref 0.2–1.2)

## 2022-05-03 LAB — COMPLETE BLOOD COUNT WITH DIFFERENTIAL
Abs Basophils: 0.01 10*9/L (ref 0.00–0.10)
Abs Eosinophils: 0.03 10*9/L (ref 0.00–0.40)
Abs Imm Granulocytes: 0.11 10*9/L — ABNORMAL HIGH (ref <0.10)
Abs Lymphocytes: 0.78 10*9/L — ABNORMAL LOW (ref 1.00–3.40)
Abs Monocytes: 0.59 10*9/L (ref 0.20–0.80)
Abs Neutrophils: 3.3 10*9/L (ref 1.80–6.80)
Hematocrit: 25 % — ABNORMAL LOW (ref 36.0–46.0)
Hemoglobin: 7.4 g/dL — ABNORMAL LOW (ref 12.0–15.5)
MCH: 33.3 pg (ref 26.0–34.0)
MCHC: 29.6 g/dL — ABNORMAL LOW (ref 31.0–36.0)
MCV: 113 fL — ABNORMAL HIGH (ref 80–100)
MPV: 12.7 fL — ABNORMAL HIGH (ref 9.1–12.6)
Platelet Count: 95 10*9/L — ABNORMAL LOW (ref 140–450)
RBC Count: 2.22 10*12/L — ABNORMAL LOW (ref 4.00–5.20)
RDW-CV: 18 % — ABNORMAL HIGH (ref 11.7–14.4)
WBC Count: 4.8 10*9/L (ref 3.4–10.0)

## 2022-05-03 LAB — ALKALINE PHOSPHATASE: Alkaline Phosphatase: 229 U/L — ABNORMAL HIGH (ref 38–108)

## 2022-05-03 LAB — GAMMA-GLUTAMYL TRANSPEPTIDASE: Gamma-Glutamyl Transpeptidase: 117 U/L — ABNORMAL HIGH (ref 8–59)

## 2022-05-03 LAB — BASIC METABOLIC PANEL (NA, K, CL, CO2, BUN, CR, GLU, CA)
Anion Gap: 5 (ref 4–14)
Calcium, total, Serum / Plasma: 8 mg/dL — ABNORMAL LOW (ref 8.4–10.5)
Carbon Dioxide, Total: 27 mmol/L (ref 22–29)
Chloride, Serum / Plasma: 110 mmol/L (ref 101–110)
Creatinine: 0.54 mg/dL — ABNORMAL LOW (ref 0.55–1.02)
Glucose, non-fasting: 107 mg/dL (ref 70–199)
Potassium, Serum / Plasma: 4.3 mmol/L (ref 3.5–5.0)
Sodium, Serum / Plasma: 142 mmol/L (ref 135–145)
Urea Nitrogen, Serum / Plasma: 31 mg/dL — ABNORMAL HIGH (ref 7–25)
eGFRcr: 104 mL/min/{1.73_m2} (ref >59)

## 2022-05-03 LAB — PHOSPHORUS, SERUM / PLASMA: Phosphorus, Serum / Plasma: 3.4 mg/dL (ref 2.3–4.7)

## 2022-05-03 LAB — ASPARTATE TRANSAMINASE: AST: 59 U/L — ABNORMAL HIGH (ref 5–44)

## 2022-05-03 LAB — MAGNESIUM, SERUM / PLASMA: Magnesium, Serum / Plasma: 1.8 mg/dL (ref 1.6–2.6)

## 2022-05-03 LAB — ALANINE TRANSAMINASE: Alanine transaminase: 26 U/L (ref 10–61)

## 2022-05-03 MED ORDER — SPIRONOLACTONE 100 MG TABLET
100 | Freq: Every day | ORAL | Status: DC
Start: 2022-05-03 — End: 2022-05-17
  Administered 2022-05-04 – 2022-05-17 (×11): via GASTROSTOMY

## 2022-05-03 MED ORDER — FUROSEMIDE 20 MG TABLET
20 mg | ORAL | Status: DC
  Administered 2022-05-04: 17:00:00 40 mg via GASTROSTOMY
  Administered 2022-05-05 – 2022-05-14 (×8): via GASTROSTOMY
  Administered 2022-05-15: 17:00:00 40 mg via GASTROSTOMY
  Administered 2022-05-16 – 2022-05-17 (×2): via GASTROSTOMY

## 2022-05-03 MED FILL — ONDANSETRON HCL 4 MG TABLET: 4 mg | ORAL | Qty: 2

## 2022-05-03 MED FILL — SPIRONOLACTONE 25 MG TABLET: 25 mg | ORAL | Qty: 2

## 2022-05-03 MED FILL — CHOLECALCIFEROL (VITAMIN D3) 25 MCG (1,000 UNIT) TABLET: 1000 UNITS | ORAL | Qty: 2

## 2022-05-03 MED FILL — CYANOCOBALAMIN (VIT B-12) 1,000 MCG/ML INJECTION SOLUTION: 1000 mcg/mL | INTRAMUSCULAR | Qty: 1

## 2022-05-03 MED FILL — TAB-A-VITE 400 MCG TABLET: 400 mcg | ORAL | Qty: 1

## 2022-05-03 MED FILL — XIFAXAN 550 MG TABLET: 550 mg | ORAL | Qty: 1

## 2022-05-03 MED FILL — PREVYMIS 480 MG TABLET: 480 mg | ORAL | Qty: 1

## 2022-05-03 MED FILL — ANTI-DIARRHEAL (LOPERAMIDE) 2 MG TABLET: 2 mg | ORAL | Qty: 2

## 2022-05-03 MED FILL — MIRTAZAPINE 30 MG TABLET: 30 mg | ORAL | Qty: 1

## 2022-05-03 MED FILL — FUROSEMIDE 20 MG TABLET: 20 mg | ORAL | Qty: 1

## 2022-05-03 MED FILL — THIAMINE MONONITRATE (VITAMIN B1) 100 MG TABLET: 100 mg | ORAL | Qty: 1

## 2022-05-03 MED FILL — FOLIC ACID 1 MG TABLET: 1 mg | ORAL | Qty: 1

## 2022-05-03 MED FILL — LANSOPRAZOLE 3 MG/ML ORAL SUSPENSION COMPOUNDING KIT: 3 mg/mL | ORAL | Qty: 10

## 2022-05-03 MED FILL — MAG-AL PLUS 200 MG-200 MG-20 MG/5 ML ORAL SUSPENSION: 200-200-20 mg/5 mL | ORAL | Qty: 30

## 2022-05-03 NOTE — Consults (Signed)
Treatment Note    Interval History  Interval History  Interval History: 6/17 JIZ:[id number] distention of the stomach, few loops of small bowel and transverse colon consistent with mild ileus.  Language Assistant  Interpreter Needed?: No    Subjective  Subjective  Patient subjective: Stable presentation.  Agreeable to SLP services.  Allowing HOB to be raised to about 80 degrees         Objective  Short term goal(s) progress: F/u dysphagia mgmt. and repeat education for MBSS prep.  Stable presentation and in agreement to go forward w/ MBSS.    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 Recommend to continue with easy to chew diet, 1:1 feeding, encourage upright positioning, moist foods encouraged, oral care prior to all PO. Low threshold to hold if Pt unable to follow aforementioned guidelines.  Pt/family in agreement w/ MBSS w/ plan to complete tomorrow ~10AM.  Current Functional Level  Domain: Swallowing  Swallowing: <Level 5>  Mild dysphagia: Distant supervision, may need one diet consistency restricted  Rehab Potential  Rehab potential judged to be: Good  Facilitators: Motivation;Family/community support;Insight into deficits  Barriers: Co-morbidities    Recommendations  Swallowing Recommendations  Solids: Easy to chew  Liquids: Level 0 (Thin)  Liquid administration: Cup;Straw  Medication administration: Crushed with puree, as able  Supervision during meals: 1:1 assistance  Mealtime strategies and positioning: Upright;Remain upright 30 minutes after eating;Alternate solids and liquids;Small bites;Small single sips;Slow pace  Meal strategy comment(s): encourage PO intake, self feeding; oral care prior to all PO                Plans and Goals  Plan: See listed goals;Complete instrumental swallow exam  Frequency: 2x/week    Discharge Recommendations: Placement for continued intensive therapy  upon discharge with PT/OT input       Updated Short Term Goals    04/29/22 1442 05/03/22 1324   Short Term Goals- Updated: The patient/family/caregiver will identify and demonstrate compensatory swallow strategies and/or precautions to improve safe and efficient swallowing with 100% accuracy The patient/family/caregiver will identify and demonstrate compensatory swallow strategies and/or precautions to improve safe and efficient swallowing with 100% accuracy       Abelardo Diesel, SLP  05/03/2022

## 2022-05-03 NOTE — Plan of Care (Signed)
Problem: Discharge Planning - Adult  Goal: Knowledge of and participation in plan of care  Outcome: Progress within 12 hours     Problem: Fall, at Risk or Actual - Adult  Goal: Absence of falls and fall related injury  Outcome: Progress within 12 hours     Problem: Delirium - Adult / Pediatric  Goal: Absence or resolution of delirium  Outcome: Progress within 12 hours     Problem: Nutrition, Alteration in -Adult  Goal: Adequate nutritional intake  Outcome: Progress within 12 hours  Goal: Maximize nutritional intake per patient condition  Outcome: Progress within 12 hours     Problem: Pressure Injury,At Risk and Actual- Adult  Goal: Absence of any new pressure injury  Outcome: Progress within 12 hours  Goal: Pressure injury healing/stabilization  Outcome: Progress within 12 hours  Goal: Participation in preventative efforts and treatment plan (Patient/Family/Caregiver)  Outcome: Progress within 12 hours  Goal: Adequate nutritional intake  Outcome: Progress within 12 hours

## 2022-05-03 NOTE — Progress Notes (Addendum)
[facility] PROGRESS NOTE     This is an independent service.   The available consultant for this service is [facility] Carolynn Serve, MD.           24 Hour Course  - No acute events overnight  - Plan for MBSS 6/20 ~10am per SLP  - Plan for therapeutic paracentesis by TAPS team 6/20  - Increased spironolactone/lasix medications for ascites volume management  - Increased mirtazapine to '30mg'$  per palliative care recs  - Ordered weekly CMV PCR to begin 6/20    Subjective  Patient said she wasn't having abdominal pain or tenderness at the time.  Patient seemed anxious and was unable to answer all questions - responded "I'll have to ask my mom about that" to a few questions about her symptoms.  Daughter at bedside was agreeable to the plan of upcoming procedures. She also reported that her mom is back to her baseline in terms of bowel movements, having 2-3 per day.    Vitals  Temp:  [37.3 C (99.1 F)-37.9 C (100.3 F)] 37.6 C (99.6 F)  Heart Rate:  [89-96] 90  *Resp:  [16-18] 16  BP: (99-112)/(70-77) 112/77  SpO2:  [97 %-100 %] 99 %    MostRecent Weight: 69 kg (152 lb 1.9 oz)  Admission Weight: 66.2 kg (146 lb)      Intake/Output Summary (Last 24 hours) at 05/03/2022 1752  Last data filed at 05/03/2022 1648  Gross per 24 hour   Intake 850 ml   Output 200 ml   Net 650 ml       Physical Exam  Gen: NAD, alert and oriented x3, but slowed mentation  HEENT: EOMI, dry mucus membranes but no open lesions/sores  Resp: no crackles/wheezing  Card: RRR  Gi: moderately distended, but not firm or tender to palpation  Ext: negative BLE edema   Skin: scattered ecchymosis to BUE  Neuro: no focal deficits  Lines: PIV x1 in place, clean dry & intact dressing    Scheduled Meds:   0.9% sodium chloride flush  3 mL Intravenous Q12H [place]    cholecalciferol (vitamin D3)  2,000 Units Per G Tube Daily [place]    cyanocobalamin (Vitamin B12)  1,000 mcg Subcutaneous Daily [place]    Followed by    Derrill Memo ON 05/08/2022] cyanocobalamin (Vitamin B12)  1,000  mcg Subcutaneous Q7 Days    ergocalciferol (vitamin D2)  50,000 Units Per G Tube Q7 Days    folic acid  1 mg Per G Tube Daily [facility]    [START ON 05/04/2022] furosemide  40 mg Per G Tube Daily [facility]    HYDROmorphone  0.4 mg Intravenous Once    lansoprazole  30 mg Per G Tube Q AM Before Breakfast [facility]    letermovir  480 mg Other Daily [place]    loperamide  4 mg Feeding Tube Q6H SCH    mirtazapine  30 mg Per G Tube Daily At Bedtime [facility]    multivitamin  1 tablet Per G Tube Daily SCH    ondansetron  8 mg Per G Tube TID    rifAXIMin  550 mg Feeding Tube BID [place]    [START ON 05/04/2022] spironolactone  100 mg Per G Tube Daily [place]    thiamine mononitrate (vit B1)  100 mg Per G Tube Daily [place]    ursodiol  600 mg Per G Tube BID SCH    zinc acetate  25 mg of elemental zinc (Zn) Per G Tube BID  [facility]     Continuous Infusions:  PRN Meds:   0.9% sodium chloride flush  3 mL Intravenous PRN    acetaminophen  650 mg Per G Tube Q8H PRN    albuterol  2.5 mg Nebulization Q4H PRN    Or    albuterol  2.5 mg Nebulization Q4H PRN    aluminum-magnesium hydroxide-simethicone  15 mL Per G Tube Q4H PRN    HYDROcodone-acetaminophen  1-2 tablet Per G Tube Q6H PRN    HYDROmorphone  0.2-0.4 mg Intravenous Q4H PRN    hydrOXYzine  25 mg Feeding Tube Q4H PRN    opium  6 mg Oral 4x Daily PRN    prochlorperazine  5 mg Intravenous Q8H PRN    Or    prochlorperazine  5 mg Per G Tube Q8H PRN       Data    CBC        05/03/22  0543   WBC 4.8   HGB 7.4*   HCT 25.0*   PLT 95*       Coags  No results found in last 36 hours  Chem7        05/03/22  0543   NA 142   K 4.3   CL 110   CO2 27   BUN 31*   CREAT 0.54*   GLU 107       Electrolytes        05/03/22  0543   CA 8.0*   MG 1.8   PO4 3.4       Liver Panel        05/03/22  0543   AST 59*   ALT 26   ALKP 229*   TBILI 0.7       UA  No results found in last 7 days  Microscopy:  No results found in last 7 days    Microbiology Results (last 72 hours)       Procedure Component Value Units Date/Time    Bacterial Culture, Normally  Sterile Sites, With Gram Stain [[id number]] Collected: 04/23/22 0028    Order Status: Completed Specimen: Not Applicable from Peritoneal Fluid Updated: 05/03/22 1215     Gram Stain No PMNs seen , Rare Host cells (not PMNs, RBCs, or squamous epithelial cells) , Few RBCs      No organisms seen     Bacterial Culture, Sterile Sites No growth 10 days.            Radiology Results  No results found.    I discussed the patient with Tilden Fossa, MD from [facility] regarding A&P.    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   Receiving dexamethasone 12 mg weekly for diarrhea as outpt  10/16/21 - last Daratumumab dose (on hold due to diarrhea)     DATA:   09/07/2010: IgG Kappa MM  12/02/2021: Ig M <5, IgG 447, IgA 20, Ig E <2, SFLC  Kappa 16.6, Lambda 1.6, Kappa/Lamda 10.38  12/14/21: IgG K on IFE, IgG 430, KLC 16.6, LLC 2.7, KLR 6.15  02/23/22 SPEP with stable M-spike, SFLC decreased Kappa 44.2 / Lambda 5.5 = K/L ratio = 8.04 (overall downtrending still)  04/23/22 M-spike 0.6, IgG K on IFE, IgG 997, Kappa 70.7/Lambda 19.8 = K/L ratio = 3.57     Chemo:  TBD, currently off therapy due to recurrent infections     Supportive Care:   Transfuse for Hgb <7 and Platelets <10  Access: PIV; consider PICC placement to manage pain of blood draws     Primary Oncologist:   Dr. Jacelyn Grip ([place])  Dr. Iona Coach ([place])     Disposition:  Lives in [place] - anticipate return to home with [place] vs SNF, currently working with PT     #  Immunocompromised  Due to underlying malignancy. Not currently neutropenic.  UCx 6/10 w/ 100,000CFU enterococcus & C. albicans. Not currently having any sx of dysuria, previous UCx w/ C. albicans. --> defer treatment as asymptomatic, sample likely contaminated w/ skin flora.    # Diarrhea  # c/b CMV Viremia  # Hx prolonged norovirus infection  -Extended hospitalization at both [place] 1/23-04/16/22, with hypovolemic shock associated with norovirus, acute liver failure associated with pomylast, CMV colitis.   08/2021 - first +norovirus with persistent +norovirus s/p 7-day courses x 2 nitazoxanide (2/16-02/22/23 & 01/2022), but norovirus continued to be positive (most recently 04/07/22)- subsequent clearance of norovirus per test 6/9,   -Regarding CMV colitis, 12/17/21 flex sig showed CMV colitis, sp multiple cycles of gancyclovir IV, IVIG, valcyte 900 mg PO BID, then ultimately transitioned to letermovir, which she continues. Her CMV (iu/mL) 443,961--> 179,555--> 74 (2/22) --> 176 (3/1) --> 105 (6/9)  -At home was taking loperamide BID and diarrhea was controlled to 2-3 times daily. Diarrhea had improved to 1 stool on 6/11, but worsened on 6/12 (11 stools). Possibly r/t to inadequate dosing of loperamdie vs infectious etiology. GI c/s 6/13 for possible scope - per patient and family declined endoscopy in favor of supportive medical interventions and to revisit endoscopy if diarrhea doesn't improve.    6/14 - Given CMV stable and norovirus negative and enteric panel unrevealing - favor non-infectious etiologies for diarrhea     Data:  6/9: C. Diff neg, GI bacterial/viral/parasite panels neg, norovirus neg     Plan  -Continue letermovir (6/16- )  - Trend weekly CMV PCR (beginning 6/20)  - Patient and family defer endoscopy (offered by GI previously)  - Revisit if medical mgmt ineffective, but currently back to baseline (2-3 stools in 24 hours) per daughter  - Continue loperamide q6h ATC  - Continue tincture of opium QID  PRN  - Defer ID consultation in absence of new ID findings on enteric workup     # Acute liver failure, now requiring weekly paracentesis  # Pericentral/sinusioidal and periportal fibrosis, ductal reaction with focal pericholangitis from vanishing bile duct in the setting of pomalidomide.   # Hepatic Encephalopathy  Diagnostic paracentesis negative for SBP. Malnutrition may also be contributing to ascites (see below).    Dx:  - 6/17 KUB: mild ileus and density over abdomen (likely ascitic fluid) - not clinically significant as patient is having regular bowel movements and no PVR noted.  '[ ]'$  6/20 therapeutic paracentesis by TAPS    Plan:  -Increase spironolactone ('100mg'$ ) / lasix ('40mg'$ ) to minimize accumulation of ascites (started 6/16, increased 6/19- )  -Continue Ursodiol 600 mg  PO BID  -Continue rifaximin 550 mg PO QD  -Continue midodrine 10 mg PO TID  -Low sodium diet  -2L fluid restriction     # Failure to thrive  # PEG tube dependence  # Macrocytic anemia - B12 deficiency  PEG tube placed during last admission. Low B12 level but MMA normal, which is not consistent with B12 deficiency, however still treating at this time as below.  -Continue TF - Nutren 1.5 - goal 23m/hr, at goal  -Continue cyanocobalamin 10039m SQ daily x7 days (6/17-6/23) , then weekly (6/24- )  -Pending copper level    # Dysphagia  Dysphagia and aspiration risk assessed by speech language pathology. SLP recommending Modified Barium Swallow test to objectively measure swallow physiology to best support diet recommendations and supportive strategies for reduce aspiration risk. Patient and daughter agreeable 04/30/22.  - MBSS eval 6/20 ~10am  - See failure to thrive section as well re: PEG tube and nutritional deficiencies     # Pain  # Abdominal cramping   # Mental anguish  -Consulted Palliative Care 6/17 for SMS  -Continue hydrocodone-acetaminophen PRN  -Continue hydromorphone PRN  -Increased mirtazapine to '30mg'$  nightly (6/18) per palliative  recs     # Physical Deconditioning  -PT/OT - recommend placement SNF    VTE PPx:  Contraindicated: Platelets <= 50 or around 50    Severity of Illness  Stable    Nutritional Assessment  Severe Chronic disease or condition related malnutrition    During this hospitalization the patient is also being treated for:  - Anemia associated with malignancy (present on admission)    - Thrombocytopenia (present on admission)    - Coagulopathy (present on admission)    - Malignancy associated fatigue on admission  - Neoplasm/malignancy associated pain  - Hypoalbuminemia (present on admission)  - HYPOnatremia (present on admission)  - Fluid status: Volume overload on admission  - Cachexia    Code Status: FULL      I spent a total time of 50 minutes in this episode of care preparing to see the patient, obtaining and/or reviewing separately obtained history, performing a medically appropriate examination and/or evaluation, counseling and educating patient/family/caregiver, ordering and reviewing medications, tests, or procedures, referring and communicating with other health care professionals, documenting clinical information in the electronic or other health record, and communicating results to the patient/family/caregiver, and/or other care coordination.    JaAbran DukeNP  05/03/22

## 2022-05-04 LAB — COMPLETE BLOOD COUNT WITH DIFFERENTIAL
Abs Basophils: 0.02 10*9/L (ref 0.00–0.10)
Abs Eosinophils: 0.03 10*9/L (ref 0.00–0.40)
Abs Imm Granulocytes: 0.16 10*9/L — ABNORMAL HIGH (ref <0.10)
Abs Lymphocytes: 0.94 10*9/L — ABNORMAL LOW (ref 1.00–3.40)
Abs Monocytes: 0.64 10*9/L (ref 0.20–0.80)
Abs Neutrophils: 3.88 10*9/L (ref 1.80–6.80)
Hematocrit: 25.2 % — ABNORMAL LOW (ref 36.0–46.0)
Hemoglobin: 7.7 g/dL — ABNORMAL LOW (ref 12.0–15.5)
MCH: 33.6 pg (ref 26.0–34.0)
MCHC: 30.6 g/dL — ABNORMAL LOW (ref 31.0–36.0)
MCV: 110 fL — ABNORMAL HIGH (ref 80–100)
MPV: 13.4 fL — ABNORMAL HIGH (ref 9.1–12.6)
Platelet Count: 96 10*9/L — ABNORMAL LOW (ref 140–450)
RBC Count: 2.29 10*12/L — ABNORMAL LOW (ref 4.00–5.20)
RDW-CV: 17.9 % — ABNORMAL HIGH (ref 11.7–14.4)
WBC Count: 5.7 10*9/L (ref 3.4–10.0)

## 2022-05-04 LAB — MAGNESIUM, SERUM / PLASMA: Magnesium, Serum / Plasma: 1.7 mg/dL (ref 1.6–2.6)

## 2022-05-04 LAB — BASIC METABOLIC PANEL (NA, K, CL, CO2, BUN, CR, GLU, CA)
Anion Gap: 5 (ref 4–14)
Calcium, total, Serum / Plasma: 7.8 mg/dL — ABNORMAL LOW (ref 8.4–10.5)
Carbon Dioxide, Total: 27 mmol/L (ref 22–29)
Chloride, Serum / Plasma: 109 mmol/L (ref 101–110)
Creatinine: 0.55 mg/dL (ref 0.55–1.02)
Glucose, non-fasting: 111 mg/dL (ref 70–199)
Potassium, Serum / Plasma: 4.3 mmol/L (ref 3.5–5.0)
Sodium, Serum / Plasma: 141 mmol/L (ref 135–145)
Urea Nitrogen, Serum / Plasma: 29 mg/dL — ABNORMAL HIGH (ref 7–25)
eGFRcr: 104 mL/min/{1.73_m2} (ref >59)

## 2022-05-04 LAB — ALANINE TRANSAMINASE: Alanine transaminase: 25 U/L (ref 10–61)

## 2022-05-04 LAB — PHOSPHORUS, SERUM / PLASMA: Phosphorus, Serum / Plasma: 3.3 mg/dL (ref 2.3–4.7)

## 2022-05-04 LAB — BILIRUBIN, TOTAL: Bilirubin, Total: 0.7 mg/dL (ref 0.2–1.2)

## 2022-05-04 LAB — ALKALINE PHOSPHATASE: Alkaline Phosphatase: 244 U/L — ABNORMAL HIGH (ref 38–108)

## 2022-05-04 LAB — GAMMA-GLUTAMYL TRANSPEPTIDASE: Gamma-Glutamyl Transpeptidase: 111 U/L — ABNORMAL HIGH (ref 8–59)

## 2022-05-04 LAB — ASPARTATE TRANSAMINASE: AST: 59 U/L — ABNORMAL HIGH (ref 5–44)

## 2022-05-04 MED ORDER — LORAZEPAM 1 MG TABLET
1 | Freq: Once | ORAL | Status: DC | PRN
Start: 2022-05-04 — End: 2022-05-04

## 2022-05-04 MED ORDER — HYDROMORPHONE (PF) 0.5 MG/0.5 ML INJECTION SYRINGE
0.5 | Freq: Once | INTRAMUSCULAR | Status: DC | PRN
Start: 2022-05-04 — End: 2022-05-12

## 2022-05-04 MED ORDER — ALBUMIN, HUMAN 25 % INTRAVENOUS SOLUTION
25 | INTRAVENOUS | Status: AC
Start: 2022-05-04 — End: 2022-05-04
  Administered 2022-05-04 – 2022-05-05 (×2): 50 mL via INTRAVENOUS

## 2022-05-04 MED ORDER — LORAZEPAM 1 MG TABLET
1 | Freq: Once | ORAL | Status: AC | PRN
Start: 2022-05-04 — End: 2022-05-04
  Administered 2022-05-04: 07:00:00 1 mg via GASTROSTOMY

## 2022-05-04 MED FILL — ANTI-DIARRHEAL (LOPERAMIDE) 2 MG TABLET: 2 mg | ORAL | Qty: 2

## 2022-05-04 MED FILL — ONDANSETRON HCL 4 MG TABLET: 4 mg | ORAL | Qty: 2

## 2022-05-04 MED FILL — CHOLECALCIFEROL (VITAMIN D3) 25 MCG (1,000 UNIT) TABLET: 1000 UNITS | ORAL | Qty: 2

## 2022-05-04 MED FILL — HYDROXYZINE HCL 25 MG TABLET: 25 mg | ORAL | Qty: 1

## 2022-05-04 MED FILL — HYDROCODONE 5 MG-ACETAMINOPHEN 325 MG TABLET: 5-325 mg | ORAL | Qty: 1

## 2022-05-04 MED FILL — ZINC ACETATE DIHYDRATE (BULK) 100 % CRYSTALS: 100 % | Qty: 0.08

## 2022-05-04 MED FILL — XIFAXAN 550 MG TABLET: 550 mg | ORAL | Qty: 1

## 2022-05-04 MED FILL — ALBUTEIN 25 % INTRAVENOUS SOLUTION: 25 % | INTRAVENOUS | Qty: 50

## 2022-05-04 MED FILL — LORAZEPAM 1 MG TABLET: 1 mg | ORAL | Qty: 1

## 2022-05-04 MED FILL — DILAUDID (PF) 0.5 MG/0.5 ML INJECTION SYRINGE: 0.5 mg/ mL | INTRAMUSCULAR | Qty: 0.5

## 2022-05-04 MED FILL — URSODIOL (BULK) 100 % POWDER: 100 % | Qty: 0.6

## 2022-05-04 MED FILL — FOLIC ACID 1 MG TABLET: 1 mg | ORAL | Qty: 1

## 2022-05-04 MED FILL — CYANOCOBALAMIN (VIT B-12) 1,000 MCG/ML INJECTION SOLUTION: 1000 mcg/mL | INTRAMUSCULAR | Qty: 1

## 2022-05-04 MED FILL — PREVYMIS 480 MG TABLET: 480 mg | ORAL | Qty: 1

## 2022-05-04 MED FILL — LANSOPRAZOLE 3 MG/ML ORAL SUSPENSION COMPOUNDING KIT: 3 mg/mL | ORAL | Qty: 10

## 2022-05-04 MED FILL — THIAMINE MONONITRATE (VITAMIN B1) 100 MG TABLET: 100 mg | ORAL | Qty: 1

## 2022-05-04 MED FILL — SPIRONOLACTONE 100 MG TABLET: 100 mg | ORAL | Qty: 1

## 2022-05-04 MED FILL — FUROSEMIDE 20 MG TABLET: 20 mg | ORAL | Qty: 2

## 2022-05-04 MED FILL — MIRTAZAPINE 30 MG TABLET: 30 mg | ORAL | Qty: 1

## 2022-05-04 MED FILL — TAB-A-VITE 400 MCG TABLET: 400 mcg | ORAL | Qty: 1

## 2022-05-04 NOTE — Procedures (Signed)
PROCEDURE NOTE    Patient Name: Ariel Braun  [id number]   1961/11/13                          Diagnoses:  1. Multiple myeloma and immunoproliferative neoplasms (CMS code)    2. Abdominal distension    3. Norovirus        Paracentesis    Date/Time: 05/04/2022 1:28 PM  Performed by: Alethia Berthold, MD  Authorized by: Alethia Berthold, MD     Consent Process:     Emergent situation preventing consent process:  No    Discussion with patient included the following (comment on exceptions):  Risks, benefits, side effects, likelihood of success of the proposed treatment, anticipated recuperation, and the alternative options, Diagnosis (the reason for which the procedure is being proposed) and proposed treatment or procedure and Patient's questions related to the procedure were addressed  Universal Protocol - Time Out Checklist:     Patient ID verified:  Yes    Surgical/procedural site and side verified:  Yes    Site marking verified:  Yes  Pre-procedure details:     Procedure purpose:  Therapeutic    Skin preparation:  Chloraprep    Preparation: Patient was prepped and draped in sterile fashion    Pre-procedure medications (see MAR for exact dosages):     Procedural sedation: patient not sedated    Anesthetic used:  Local infiltration    Local anesthetic:  Lidocaine in kit    Amount of local anesthetic injected (ml): 5  Procedure details:     Ultrasound guidance: yes      Puncture site:  L lower quadrant    Needle gauge:  20    Catheter size:  20 G    Number of attempts:  1      Fluid amount removed (ml): 4000    Fluid appearance:  Serous    Dressing: adhesive bandage  Post-procedure details:     25% albumin IV repletion indicated: 25% albumin IV repletion indicated    Repletion amount (ml): 100    Patient tolerance of procedure:  Tolerated well, no immediate complications  Comments:      Please see images of ultrasound and ascites below.   Thank you for this consultation.   Please contact us with any questions [phone].   Alethia Berthold,  MD      Alethia Berthold, MD  05/04/2022

## 2022-05-04 NOTE — Consults (Signed)
OCCUPATIONAL THERAPY CANCELED SESSION NOTE     OT session missed for the following reason(s): Procedure/imaging  Missed/Cancelled Session Comment: Pt off floor for MBSS in AM, attempted in PM getting tapped.                  Roma Schanz, OT  05/04/22

## 2022-05-04 NOTE — Interdisciplinary (Addendum)
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 Team's current goal is  to discuss possible chemo plan when she is less deconditioned.  Placement is recommended for now.      CM sw Janene Madeira @ [facility] [phone]  .  Informed her that no op follow up is planned for now, and will not be getting any chemo during placement.  She will review referral again and will respond tomorrow.     Pam Drown  MSN, RN, CM  [facility]  Hematology, Oncology    05/04/2022

## 2022-05-04 NOTE — Consults (Signed)
PHYSICAL THERAPY CANCELED SESSION NOTE     Cancellation: Cancelled session  PT session missed for the following reasons (S): Patient at Procedure/Imaging  Missed/Cancelled Session Comment: Receiving procedure in the room.    Berlinda Last, PT  05/04/2022

## 2022-05-04 NOTE — Consults (Signed)
Canceled Session    CaP- MBSS cancelled due to hip pain expressed during attempted transport for study.  Pt requested re-schedule for tomorrow tentatively planned for [place], SLP  05/04/2022

## 2022-05-04 NOTE — Progress Notes (Addendum)
[facility] PROGRESS NOTE     This is an independent service.   The available consultant for this service is [facility] Carolynn Serve, MD.           24 Hour Course  - No acute events overnight  - Plan for MBSS 6/21 (patient declined it on 6/20 due to abd pain)  - s/p therapeutic paracentesis by TAPS 6/20, -4L. Tolerated well and then received albumin.    Subjective  Patient said she wasn't having abdominal pain or tenderness at the time that I saw her, but per bedside RN she previously had severe abd pain and deferred MBSS.  Daughter at bedside still reports that her mom is back to her baseline in terms of BMs, having 2-3 per day. She did report that the patient mentioned feeling some constipation, but unsure if it was related to the significant ascites.    Vitals  Temp:  [36.9 C (98.4 F)-37.8 C (100 F)] 37 C (98.6 F)  Heart Rate:  [91-95] 93  *Resp:  [18-20] 18  BP: (92-110)/(58-74) 92/58  SpO2:  [96 %-100 %] 96 %    MostRecent Weight: 69 kg (152 lb 1.9 oz)  Admission Weight: 66.2 kg (146 lb)      Intake/Output Summary (Last 24 hours) at 05/04/2022 1842  Last data filed at 05/04/2022 1706  Gross per 24 hour   Intake 1953 ml   Output 4875 ml   Net -2922 ml       Physical Exam  Gen: NAD, alert and oriented x3, but slowed mentation  HEENT: EOMI, dry mucus membranes and lips, but no open lesions/sores  Resp: no crackles/wheezing  Card: RRR  Gi: significantly distended, mildly taut/firm, nontender to palpation  Ext: negative BLE edema   Skin: scattered ecchymosis to BUE  Neuro: no focal deficits  Lines: PIV x1 in place, clean dry & intact dressing    Scheduled Meds:   0.9% sodium chloride flush  3 mL Intravenous Q12H [place]    cholecalciferol (vitamin D3)  2,000 Units Per G Tube Daily [place]    cyanocobalamin (Vitamin B12)  1,000 mcg Subcutaneous Daily [place]    Followed by    Derrill Memo ON 05/08/2022] cyanocobalamin (Vitamin B12)  1,000 mcg Subcutaneous Q7 Days    ergocalciferol (vitamin D2)  50,000 Units Per G Tube Q7 Days     folic acid  1 mg Per G Tube Daily [place]    furosemide  40 mg Per G Tube Daily [place]    lansoprazole  30 mg Per G Tube Q AM Before Breakfast [facility]    letermovir  480 mg Other Daily [place]    loperamide  4 mg Feeding Tube Q6H [facility]    mirtazapine  30 mg Per G Tube Daily At Bedtime [facility]    multivitamin  1 tablet Per G Tube Daily [facility]    ondansetron  8 mg Per G Tube TID    rifAXIMin  550 mg Feeding Tube BID [facility]    spironolactone  100 mg Per G Tube Daily [place]    thiamine mononitrate (vit B1)  100 mg Per G Tube Daily [place]    ursodiol  600 mg Per G Tube BID [facility]    zinc acetate  25 mg of elemental zinc (Zn) Per G Tube BID [place]     Continuous Infusions:  PRN Meds:   0.9% sodium chloride flush  3 mL Intravenous PRN    acetaminophen  650 mg Per G Tube Q8H PRN  albuterol  2.5 mg Nebulization Q4H PRN    Or    albuterol  2.5 mg Nebulization Q4H PRN    aluminum-magnesium hydroxide-simethicone  15 mL Per G Tube Q4H PRN    HYDROcodone-acetaminophen  1-2 tablet Per G Tube Q6H PRN    HYDROmorphone  0.2-0.4 mg Intravenous Q4H PRN    HYDROmorphone  0.4 mg Intravenous Once PRN    hydrOXYzine  25 mg Feeding Tube Q4H PRN    opium  6 mg Oral 4x Daily PRN    prochlorperazine  5 mg Intravenous Q8H PRN    Or    prochlorperazine  5 mg Per G Tube Q8H PRN       Data    CBC        05/04/22  0538   WBC 5.7   HGB 7.7*   HCT 25.2*   PLT 96*       Coags  No results found in last 36 hours  Chem7        05/04/22  0538   NA 141   K 4.3   CL 109   CO2 27   BUN 29*   CREAT 0.55   GLU 111       Electrolytes        05/04/22  0538   CA 7.8*   MG 1.7   PO4 3.3       Liver Panel        05/04/22  0538   AST 59*   ALT 25   ALKP 244*   TBILI 0.7       UA  No results found in last 7 days  Microscopy:  No results found in last 7 days    Microbiology Results (last 72 hours)       Procedure Component Value Units Date/Time    Bacterial Culture, Normally Sterile Sites, With Gram Stain [[id number]] Collected: 04/23/22 0028    Order Status: Completed Specimen: Not Applicable from  Peritoneal Fluid Updated: 05/04/22 1055     Gram Stain No PMNs seen , Rare Host cells (not PMNs, RBCs, or squamous epithelial cells) , Few RBCs      No organisms seen     Bacterial Culture, Sterile Sites No growth 11 days.    Cytomegalovirus DNA, Quantitative PCR, Plasma [[id number]] Collected: 05/04/22 0538    Order Status: Sent Specimen: Blood from Serum Updated: 05/04/22 7793            Radiology Results  No results found.    I discussed the patient with Tilden Fossa, MD from [facility] regarding A&P.    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   Receiving dexamethasone 12 mg weekly for diarrhea as outpt  10/16/21 - last Daratumumab dose (on hold due to diarrhea)     DATA:   09/07/2010: IgG Kappa MM  12/02/2021: Ig M <5, IgG 447, IgA 20, Ig E <2, SFLC Kappa  16.6, Lambda 1.6, Kappa/Lamda 10.38  12/14/21: IgG K on IFE, IgG 430, KLC 16.6, LLC 2.7, KLR 6.15  02/23/22 SPEP with stable M-spike, SFLC decreased Kappa 44.2 / Lambda 5.5 = K/L ratio = 8.04 (overall downtrending still)  04/23/22 M-spike 0.6, IgG K on IFE, IgG 997, Kappa 70.7/Lambda 19.8 = K/L ratio = 3.57     Chemo:  TBD, currently off therapy due to recurrent infections.  Not currently a candidate due to significant deconditioning, ongoing review of goals of care as patient demonstrating lack of progress, as evidenced by minimal PO intake, inability to tolerate PT, uncontrolled pain, and refusal to participate in plan of care.    Supportive Care:    Transfuse for Hgb <7 and Platelets <10  Access: PIV; consider PICC placement to manage pain of blood draws     Primary Oncologist:   Dr. Jacelyn Grip ([place])  Dr. Iona Coach ([place])     Disposition:  Lives in [place] - anticipate return to home with [facility] vs SNF, currently working with PT  - Patient centered rounds: 6/20, awaiting possible SNF placement vs discharge home. At this time, placement is difficult given patient has been disengaged in care. High risk for readmission.     # Immunocompromised  Due to underlying malignancy. Not currently neutropenic.  UCx 6/10 w/ 100,000CFU enterococcus & C. albicans. Not currently having any sx of dysuria, previous UCx w/ C. albicans. --> defer treatment as asymptomatic, sample likely contaminated w/ skin flora.    # Diarrhea  # c/b CMV Viremia  # Hx prolonged norovirus infection  -Extended hospitalization at both [place] 1/23-04/16/22, with hypovolemic shock associated with norovirus, acute liver failure associated with pomylast, CMV colitis.   08/2021 - first +norovirus with persistent +norovirus s/p 7-day courses x 2 nitazoxanide (2/16-02/22/23 & 01/2022), but norovirus continued to be positive (most recently 04/07/22)- subsequent clearance of norovirus per test 6/9,   -Regarding CMV colitis, 12/17/21 flex sig showed CMV colitis, sp multiple cycles of gancyclovir IV, IVIG, valcyte 900 mg PO BID, then ultimately transitioned to letermovir, which she continues. Her CMV (iu/mL) 443,961--> 179,555--> 74 (2/22) --> 176 (3/1) --> 105 (6/9)  -At home was taking loperamide BID and diarrhea was controlled to 2-3 times daily. Diarrhea had improved to 1 stool on 6/11, but worsened on 6/12 (11 stools). Possibly r/t to inadequate dosing of loperamdie vs infectious etiology. GI c/s 6/13 for possible scope - per patient and family declined endoscopy in favor of supportive medical interventions and to revisit endoscopy if diarrhea doesn't improve.    6/14 - Given CMV stable and norovirus negative and  enteric panel unrevealing - favor non-infectious etiologies for diarrhea     Data:  6/9: C. Diff neg, GI bacterial/viral/parasite panels neg, norovirus neg     Plan  -Continue letermovir (6/16- )  - Trend weekly CMV PCR, pending 6/20 result '[ ]'$  next draw 6/27  - Patient and family defer endoscopy (offered by GI previously)  - Revisit if medical mgmt ineffective, but currently back to baseline (2-3 stools in 24 hours) per daughter  - Continue loperamide q6h ATC  - Continue tincture of opium QID PRN  - Defer ID consultation in absence of new ID findings on enteric workup     # Acute liver failure, now requiring weekly paracentesis  # Pericentral/sinusioidal and periportal fibrosis, ductal reaction with focal pericholangitis from vanishing bile duct in the setting of pomalidomide.   # Hepatic Encephalopathy  Diagnostic paracentesis negative for SBP.  Malnutrition may also be contributing to ascites (see below).    Dx:  - 6/17 KUB: mild ileus and density over abdomen (likely ascitic fluid) - not clinically significant as patient is having regular bowel movements and no PVR noted.  - 6/20 therapeutic paracentesis by TAPS, -4L + albumin    Plan:  -Continue spironolactone ('100mg'$ ) / lasix ('40mg'$ ) to minimize accumulation of ascites (started 6/16, increased 6/19- )  -Continue Ursodiol 600 mg PO BID  -Continue rifaximin 550 mg PO QD  -Continue midodrine 10 mg PO TID  -Low sodium diet  -2L fluid restriction     # Failure to thrive  # PEG tube dependence  # Macrocytic anemia - B12 deficiency  PEG tube placed during last admission. Low B12 level but MMA normal, which is not consistent with B12 deficiency, however still treating at this time as below.  -Continue TF - Nutren 1.5 - goal 6m/hr, at goal  -Continue cyanocobalamin 10072m SQ daily x7 days (6/17-6/23) , then weekly (6/24- )  -Pending copper level    # Dysphagia  Dysphagia and aspiration risk assessed by speech language pathology. SLP recommending Modified Barium Swallow  test to objectively measure swallow physiology to best support diet recommendations and supportive strategies for reduce aspiration risk. Patient and daughter agreeable 04/30/22.  - MBSS eval rescheduled for 6/21  - See failure to thrive section as well re: PEG tube and nutritional deficiencies     # Pain  # Abdominal cramping   # Mental anguish  -Consulted Palliative Care 6/17 for SMS  -Continue hydrocodone-acetaminophen PRN  -Continue hydromorphone PRN  -Increased mirtazapine to '30mg'$  nightly (6/18) per palliative recs     # Physical Deconditioning  -PT/OT - recommend placement SNF    VTE PPx:  Contraindicated: Anticipated procedure    Severity of Illness  Stable    Nutritional Assessment  Severe Chronic disease or condition related malnutrition    During this hospitalization the patient is also being treated for:  - Anemia associated with malignancy (present on admission)    - Thrombocytopenia (present on admission)    - Coagulopathy (present on admission)    - Malignancy associated fatigue on admission  - Neoplasm/malignancy associated pain  - Hypoalbuminemia (present on admission)  - HYPOnatremia (present on admission)  - Fluid status: Volume overload on admission  - Cachexia    Code Status: FULL      I spent a total time of 70 minutes in this episode of care preparing to see the patient, obtaining and/or reviewing separately obtained history, performing a medically appropriate examination and/or evaluation, counseling and educating patient/family/caregiver, ordering and reviewing medications, tests, or procedures, referring and communicating with other health care professionals, documenting clinical information in the electronic or other health record, and communicating results to the patient/family/caregiver, and/or other care coordination.    JaAbran DukeNP  05/04/22

## 2022-05-04 NOTE — Plan of Care (Signed)
Problem: Discharge Planning - Adult  Goal: Knowledge of and participation in plan of care  Outcome: Progress within 12 hours     Problem: Fall, at Risk or Actual - Adult  Goal: Absence of falls and fall related injury  Outcome: Progress within 12 hours     Problem: Delirium - Adult / Pediatric  Goal: Absence or resolution of delirium  Outcome: Progress within 12 hours     Problem: Nutrition, Alteration in -Adult  Goal: Adequate nutritional intake  Outcome: Progress within 12 hours  Goal: Maximize nutritional intake per patient condition  Outcome: Progress within 12 hours     Problem: Pressure Injury,At Risk and Actual- Adult  Goal: Absence of any new pressure injury  Outcome: Progress within 12 hours  Goal: Pressure injury healing/stabilization  Outcome: Progress within 12 hours  Goal: Participation in preventative efforts and treatment plan (Patient/Family/Caregiver)  Outcome: Progress within 12 hours  Goal: Adequate nutritional intake  Outcome: Progress within 12 hours

## 2022-05-05 LAB — COMPLETE BLOOD COUNT WITH DIFFERENTIAL
Abs Basophils: 0.01 10*9/L (ref 0.00–0.10)
Abs Eosinophils: 0.02 10*9/L (ref 0.00–0.40)
Abs Imm Granulocytes: 0.1 10*9/L — ABNORMAL HIGH (ref <0.10)
Abs Lymphocytes: 0.7 10*9/L — ABNORMAL LOW (ref 1.00–3.40)
Abs Monocytes: 0.5 10*9/L (ref 0.20–0.80)
Abs Neutrophils: 3.34 10*9/L (ref 1.80–6.80)
Hematocrit: 25 % — ABNORMAL LOW (ref 36.0–46.0)
Hemoglobin: 7.6 g/dL — ABNORMAL LOW (ref 12.0–15.5)
MCH: 33.5 pg (ref 26.0–34.0)
MCHC: 30.4 g/dL — ABNORMAL LOW (ref 31.0–36.0)
MCV: 110 fL — ABNORMAL HIGH (ref 80–100)
MPV: 12.8 fL — ABNORMAL HIGH (ref 9.1–12.6)
Platelet Count: 84 10*9/L — ABNORMAL LOW (ref 140–450)
RBC Count: 2.27 10*12/L — ABNORMAL LOW (ref 4.00–5.20)
RDW-CV: 17.8 % — ABNORMAL HIGH (ref 11.7–14.4)
WBC Count: 4.7 10*9/L (ref 3.4–10.0)

## 2022-05-05 LAB — GAMMA-GLUTAMYL TRANSPEPTIDASE: Gamma-Glutamyl Transpeptidase: 90 U/L — ABNORMAL HIGH (ref 8–59)

## 2022-05-05 LAB — CYTOMEGALOVIRUS DNA, QUANTITATIVE PCR, PLASMA
CMV DNA (IU/mL): 70 IU/mL — ABNORMAL HIGH (ref <30)
CMV DNA (Log IU/mL): 1.84 — ABNORMAL HIGH (ref <1.48)

## 2022-05-05 LAB — PHOSPHORUS, SERUM / PLASMA: Phosphorus, Serum / Plasma: 3.4 mg/dL (ref 2.3–4.7)

## 2022-05-05 LAB — ALKALINE PHOSPHATASE: Alkaline Phosphatase: 211 U/L — ABNORMAL HIGH (ref 38–108)

## 2022-05-05 LAB — MAGNESIUM, SERUM / PLASMA: Magnesium, Serum / Plasma: 1.7 mg/dL (ref 1.6–2.6)

## 2022-05-05 LAB — BASIC METABOLIC PANEL (NA, K, CL, CO2, BUN, CR, GLU, CA)
Anion Gap: 6 (ref 4–14)
Calcium, total, Serum / Plasma: 7.9 mg/dL — ABNORMAL LOW (ref 8.4–10.5)
Carbon Dioxide, Total: 27 mmol/L (ref 22–29)
Chloride, Serum / Plasma: 108 mmol/L (ref 101–110)
Creatinine: 0.6 mg/dL (ref 0.55–1.02)
Glucose, non-fasting: 100 mg/dL (ref 70–199)
Potassium, Serum / Plasma: 4.3 mmol/L (ref 3.5–5.0)
Sodium, Serum / Plasma: 141 mmol/L (ref 135–145)
Urea Nitrogen, Serum / Plasma: 27 mg/dL — ABNORMAL HIGH (ref 7–25)
eGFRcr: 101 mL/min/{1.73_m2} (ref >59)

## 2022-05-05 LAB — ALANINE TRANSAMINASE: Alanine transaminase: 20 U/L (ref 10–61)

## 2022-05-05 LAB — ASPARTATE TRANSAMINASE: AST: 49 U/L — ABNORMAL HIGH (ref 5–44)

## 2022-05-05 LAB — BILIRUBIN, TOTAL: Bilirubin, Total: 0.7 mg/dL (ref 0.2–1.2)

## 2022-05-05 MED FILL — URSODIOL (BULK) 100 % POWDER: 100 % | Qty: 0.6

## 2022-05-05 MED FILL — CHOLECALCIFEROL (VITAMIN D3) 25 MCG (1,000 UNIT) TABLET: 1000 UNITS | ORAL | Qty: 2

## 2022-05-05 MED FILL — ZINC ACETATE DIHYDRATE (BULK) 100 % CRYSTALS: 100 % | Qty: 0.08

## 2022-05-05 MED FILL — SPIRONOLACTONE 100 MG TABLET: 100 mg | ORAL | Qty: 1

## 2022-05-05 MED FILL — ANTI-DIARRHEAL (LOPERAMIDE) 2 MG TABLET: 2 mg | ORAL | Qty: 2

## 2022-05-05 MED FILL — HYDROXYZINE HCL 25 MG TABLET: 25 mg | ORAL | Qty: 1

## 2022-05-05 MED FILL — FUROSEMIDE 20 MG TABLET: 20 mg | ORAL | Qty: 2

## 2022-05-05 MED FILL — DILAUDID (PF) 0.5 MG/0.5 ML INJECTION SYRINGE: 0.5 mg/ mL | INTRAMUSCULAR | Qty: 0.5

## 2022-05-05 MED FILL — PREVYMIS 480 MG TABLET: 480 mg | ORAL | Qty: 1

## 2022-05-05 MED FILL — ACETAMINOPHEN 325 MG TABLET: 325 mg | ORAL | Qty: 2

## 2022-05-05 MED FILL — LANSOPRAZOLE 3 MG/ML ORAL SUSPENSION COMPOUNDING KIT: 3 mg/mL | ORAL | Qty: 10

## 2022-05-05 MED FILL — MIRTAZAPINE 30 MG TABLET: 30 mg | ORAL | Qty: 1

## 2022-05-05 MED FILL — TAB-A-VITE 400 MCG TABLET: 400 mcg | ORAL | Qty: 1

## 2022-05-05 MED FILL — HYDROCODONE 5 MG-ACETAMINOPHEN 325 MG TABLET: 5-325 mg | ORAL | Qty: 1

## 2022-05-05 MED FILL — XIFAXAN 550 MG TABLET: 550 mg | ORAL | Qty: 1

## 2022-05-05 MED FILL — ONDANSETRON HCL 4 MG TABLET: 4 mg | ORAL | Qty: 2

## 2022-05-05 MED FILL — CYANOCOBALAMIN (VIT B-12) 1,000 MCG/ML INJECTION SOLUTION: 1000 mcg/mL | INTRAMUSCULAR | Qty: 1

## 2022-05-05 MED FILL — HYDROCODONE 5 MG-ACETAMINOPHEN 325 MG TABLET: 5-325 mg | ORAL | Qty: 2

## 2022-05-05 MED FILL — THIAMINE MONONITRATE (VITAMIN B1) 100 MG TABLET: 100 mg | ORAL | Qty: 1

## 2022-05-05 MED FILL — FOLIC ACID 1 MG TABLET: 1 mg | ORAL | Qty: 1

## 2022-05-05 NOTE — Consults (Signed)
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 Speech fluent and spontaneous. Linear. Participates with PT goal setting. Receptive to encouragement and teaching. Profound global weakness with symmetrical strength, ROM deficits, bed mobility and transfer deficits, balance dysfunctions, and decreased activity and exercise tolerance. Max A for supine to short sitting. KUSBS of 1+. Performs perturbation activities while sitting EOB to improve sitting balance and core control. Dtr educated on gentle ROME to B LE and UE for jt health. Verbalizes understanding. Tolerates sitting EOB x 2 mins. Rolling Max A for off loading. Dtr educated on delirium prevention strategies. Pt verbalizes understanding.  Pt will continue to benefit from acute skilled PT interventions to progress functional limitations and to prevent progression of hospital acquired NM weakness. Thank you for this referral.    Focus next session: Sitting tolerate EOB, trial STEDY    RECOMMENDATIONS  DISCHARGE RECOMMENDATION  Comments Placement with ongoing PT needs  pending medical optimization and progress while in house   Patient Current Functional Status Sufficient for PT Discharge Recommendation No   Discharge DME needs hospital bed, [facility] lift, wheelchair   Discharge transportation needs [facility] Potential Patient participates well in therapy and progressing towards goals     NURSING RECOMMENDATIONS  Inpatient Rehab Assistive Device Recommendation Vertical dependent lift;Ceiling lift;Lateral transfer device   Activity Recommendation up ad lib with nursing     SUBJECTIVE  Subjective report:  Agreeable. PLeasant  Notable observations:  Left supine in bed. TTilted to the L. RN notified. Dtr is present    SYSTEMS  REVIEW  Cardiopulmonary  Cardiopulmonary deficits:  Yes  Detail:  decreased activity tolerance    Musculoskeletal  Musculoskeletal deficits:  Yes  Abnormal strength findings: B LE 3-/5        Pain     Currently in pain: Yes  Pain location: buttocks and skin area  Pain scale: Wong-Baker     Pain Level Upon Arrival: Hurts a little bit  Highest Pain Level During Therapy: Hurts even more  Pain Level End of Therapy: Hurts a little bit    COMPREHENSIVE MOVEMENT ANALYSIS/TREATMENT  Precautions/WB status: Yes  Precautions and weight bearing status comments: Falls, delirium, skin, aspiration, contact      Functional Mobility     Balance  Balance deficits noted: Yes  Functional Balance for ADLs  Position Static Dynamic   Sit Static sitting level of assist: Moderate assist-   Static sitting comment: sling left on during static sit on BSC for posterior support and safety  -        Stand  -      -          Communication between other health care providers: Communication between other health care provider: RN;MD  Communication comment: goals of therapy discussion; pt's functional status     Education assessment  Learner: Patient, Caregiver  Content: Plan of care, Activity recommendations, Discharge recommendations  Response: Needs reinforcement, Verbalizes understanding, Demonstrates understanding  Outcome measures   Physical Therapist Global Assessment of Mobility  Activity Achieved: Passive ROM  AMPAC 6-clicks basic mobility score: 8      PLAN  Plan of care status:  Current plan of care remains appropriate  PT frequency:  5x/week  PT duration:  4 weeks.  Comment:  POC expires 7/10      Planned PT interventions:   Specific interventions: Progressive functional mobility training;Balance training;NM Re-ed;Aerobic training;Ther ex  Education interventions: Fall risk reduction;Exercise program;Self-pacing/breathing;Benefits of activity;Caregiver training  Comment:          Berlinda Last, PT    05/05/2022

## 2022-05-05 NOTE — Interdisciplinary (Signed)
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   Team is aware that if this SNF does not accept, pt will have to dc home w/ DME and HH.     Pam Drown  MSN, RN, CM  [place] [facility]  Hematology, Oncology    05/05/2022

## 2022-05-05 NOTE — Plan of Care (Signed)
Problem: Discharge Planning - Adult  Goal: Knowledge of and participation in plan of care  05/05/2022 0250 by Ross Marcus, NP  Outcome: Progress within 12 hours  05/05/2022 0249 by Ross Marcus, NP  Outcome: Progress within 12 hours     Problem: Fall, at Risk or Actual - Adult  Goal: Absence of falls and fall related injury  05/05/2022 0250 by Ross Marcus, NP  Outcome: Progress within 12 hours  05/05/2022 0249 by Ross Marcus, NP  Outcome: Progress within 12 hours     Problem: Delirium - Adult / Pediatric  Goal: Absence or resolution of delirium  05/05/2022 0250 by Ross Marcus, NP  Outcome: Progress within 12 hours  05/05/2022 0249 by Ross Marcus, NP  Outcome: Progress within 12 hours     Problem: Nutrition, Alteration in -Adult  Goal: Adequate nutritional intake  05/05/2022 0250 by Ross Marcus, NP  Outcome: Progress within 12 hours  05/05/2022 0249 by Ross Marcus, NP  Outcome: Progress within 12 hours  Goal: Maximize nutritional intake per patient condition  05/05/2022 0250 by Ross Marcus, NP  Outcome: Progress within 12 hours  05/05/2022 0249 by Ross Marcus, NP  Outcome: Progress within 12 hours     Problem: Pressure Injury,At Risk and Actual- Adult  Goal: Absence of any new pressure injury  05/05/2022 0250 by Ross Marcus, NP  Outcome: Progress within 12 hours  05/05/2022 0249 by Ross Marcus, NP  Outcome: Progress within 12 hours  Goal: Pressure injury healing/stabilization  05/05/2022 0250 by Ross Marcus, NP  Outcome: Progress within 12 hours  05/05/2022 0249 by Ross Marcus, NP  Outcome: Progress within 12 hours  Goal: Participation in preventative efforts and treatment plan (Patient/Family/Caregiver)  05/05/2022 0250 by Ross Marcus, NP  Outcome: Progress within 12 hours  05/05/2022 0249 by Ross Marcus, NP  Outcome: Progress within 12 hours  Goal: Adequate nutritional intake  05/05/2022 0250 by Ross Marcus, NP  Outcome: Progress within 12 hours  05/05/2022 0249 by Ross Marcus, NP  Outcome:  Progress within 12 hours     Problem: GI Elimination Impaired - Adult  Goal: Passage of soft, formed stool  05/05/2022 0250 by Ross Marcus, NP  Outcome: Progress within 12 hours  05/05/2022 0249 by Ross Marcus, NP  Outcome: Progress within 12 hours  Goal: Stool elimination per clinical condition ( e.g. GI ostomies )  05/05/2022 0250 by Ross Marcus, NP  Outcome: Progress within 12 hours  05/05/2022 0249 by Ross Marcus, NP  Outcome: Progress within 12 hours

## 2022-05-05 NOTE — Progress Notes (Addendum)
[facility] PROGRESS NOTE     This is an independent service.   The available consultant for this service is [facility] Carolynn Serve, MD.           24 Hour Course  - No acute events overnight, HDS, afebrile  - MBSS today showed moderate pharyngeal dysphagia; updated diet to reg w/ mildly thickened liquids per SLP recs  - Wishes to continue to pursue full medical management/treatment,     Subjective  Patient sleeping in bed, spoke with daughter. Ariel Braun has less abdominal pain today after her paracentesis yesterday. Diarrhea at baseline. Worked w/ OT this morning and was OOB.    Vitals  Temp:  [36.6 C (97.9 F)-37 C (98.6 F)] 36.6 C (97.9 F)  Heart Rate:  [85-93] 85  *Resp:  [15-20] 15  BP: (91-110)/(58-71) 91/60  SpO2:  [96 %-100 %] 99 %    MostRecent Weight: 69 kg (152 lb 1.9 oz)  Admission Weight: 66.2 kg (146 lb)      Intake/Output Summary (Last 24 hours) at 05/05/2022 1045  Last data filed at 05/05/2022 0249  Gross per 24 hour   Intake 1020 ml   Output 4550 ml   Net -3530 ml       Physical Exam  Gen: NAD, alert and oriented x3, but slowed mentation  HEENT: dry mucus membranes  Resp: normal work of breathing  Card: RRR  Gi: Mildy distended, soft, non-tender  Ext: negative BLE edema   Skin: scattered ecchymosis to BUE  Lines: PIV x1 in place, clean dry & intact dressing    Scheduled Meds:   0.9% sodium chloride flush  3 mL Intravenous Q12H [place]    cholecalciferol (vitamin D3)  2,000 Units Per G Tube Daily [place]    cyanocobalamin (Vitamin B12)  1,000 mcg Subcutaneous Daily [place]    Followed by    Derrill Memo ON 05/08/2022] cyanocobalamin (Vitamin B12)  1,000 mcg Subcutaneous Q7 Days    ergocalciferol (vitamin D2)  50,000 Units Per G Tube Q7 Days    folic acid  1 mg Per G Tube Daily [place]    furosemide  40 mg Per G Tube Daily [place]    lansoprazole  30 mg Per G Tube Q AM Before Breakfast [facility]    letermovir  480 mg Other Daily [place]    loperamide  4 mg Feeding Tube Q6H SCH    mirtazapine  30 mg Per G Tube Daily At Bedtime [facility]     multivitamin  1 tablet Per G Tube Daily SCH    ondansetron  8 mg Per G Tube TID    rifAXIMin  550 mg Feeding Tube BID [facility]    spironolactone  100 mg Per G Tube Daily [place]    thiamine mononitrate (vit B1)  100 mg Per G Tube Daily [place]    ursodiol  600 mg Per G Tube BID [facility]    zinc acetate  25 mg of elemental zinc (Zn) Per G Tube BID [place]     Continuous Infusions:  PRN Meds:   0.9% sodium chloride flush  3 mL Intravenous PRN    acetaminophen  650 mg Per G Tube Q8H PRN    albuterol  2.5 mg Nebulization Q4H PRN    Or    albuterol  2.5 mg Nebulization Q4H PRN    aluminum-magnesium hydroxide-simethicone  15 mL Per G Tube Q4H PRN    HYDROcodone-acetaminophen  1-2 tablet Per G Tube Q6H PRN    HYDROmorphone  0.2-0.4 mg Intravenous Q4H PRN    HYDROmorphone  0.4 mg Intravenous Once PRN    hydrOXYzine  25 mg Feeding Tube Q4H PRN    opium  6 mg Oral 4x Daily PRN    prochlorperazine  5 mg Intravenous Q8H PRN    Or    prochlorperazine  5 mg Per G Tube Q8H PRN       Data    CBC        05/05/22  0548 05/04/22  0538   WBC 4.7 5.7   HGB 7.6* 7.7*   HCT 25.0* 25.2*   PLT 84* 96*       Coags  No results found in last 36 hours  Chem7        05/05/22  0548 05/04/22  0538   NA 141 141   K 4.3 4.3   CL 108 109   CO2 27 27   BUN 27* 29*   CREAT 0.60 0.55   GLU 100 111       Electrolytes        05/05/22  0548 05/04/22  0538   CA 7.9* 7.8*   MG 1.7 1.7   PO4 3.4 3.3       Liver Panel        05/05/22  0548 05/04/22  0538   AST 49* 59*   ALT 20 25   ALKP 211* 244*   TBILI 0.7 0.7       UA  No results found in last 7 days  Microscopy:  No results found in last 7 days    Microbiology Results (last 72 hours)       Procedure Component Value Units Date/Time    Cytomegalovirus DNA, Quantitative PCR, Plasma [[id number]]  (Abnormal) Collected: 05/04/22 0538    Order Status: Completed Specimen: Blood from Serum Updated: 05/05/22 0912     CMV DNA (IU/mL) 70 IU/mL      Comment: *SUBCRITICAL*  Performed by Real-Time PCR  Note new reference range.          CMV  DNA (Log IU/mL) 1.84     Comment: Log10 IU/mL  Note new reference range.         Bacterial Culture, Normally Sterile Sites, With Gram Stain [[id number]] Collected: 04/23/22 0028    Order Status: Completed Specimen: Not Applicable from Peritoneal Fluid Updated: 05/04/22 1055     Gram Stain No PMNs seen , Rare Host cells (not PMNs, RBCs, or squamous epithelial cells) , Few RBCs      No organisms seen     Bacterial Culture, Sterile Sites No growth 11 days.            Radiology Results  No results found.    I discussed the patient with Tilden Fossa, MD from [facility] regarding A&P.    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   Receiving dexamethasone 12 mg weekly for diarrhea  as outpt  10/16/21 - last Daratumumab dose (on hold due to diarrhea)     DATA:   09/07/2010: IgG Kappa MM  12/02/2021: Ig M <5, IgG 447, IgA 20, Ig E <2, SFLC Kappa 16.6, Lambda 1.6, Kappa/Lamda 10.38  12/14/21: IgG K on IFE, IgG 430, KLC 16.6, LLC 2.7, KLR 6.15  02/23/22 SPEP with stable M-spike, SFLC decreased Kappa 44.2 / Lambda 5.5 = K/L ratio = 8.04 (overall downtrending still)  04/23/22 M-spike 0.6, IgG K on IFE, IgG 997, Kappa 70.7/Lambda 19.8 = K/L ratio = 3.57     Chemo:  TBD, currently off therapy due to recurrent infections.  Not currently a candidate due to significant deconditioning, improved participation w/ OT today.     Supportive Care:   Transfuse for Hgb <7 and Platelets <10  Access: PIV; consider PICC placement to manage  pain of blood draws     Primary Oncologist:   Dr. Jacelyn Grip ([place])  Dr. Iona Coach ([place])     Disposition:  Lives in [place] - anticipate return to home with [facility] vs SNF, currently working with PT  - Patient centered rounds: 6/20, awaiting possible SNF placement vs discharge home. At this time, placement is difficult given patient has been disengaged in care. High risk for readmission.     # Immunocompromised  Due to underlying malignancy. Not currently neutropenic.  UCx 6/10 w/ 100,000CFU enterococcus & C. albicans. Not currently having any sx of dysuria, previous UCx w/ C. albicans. --> defer treatment as asymptomatic, sample likely contaminated w/ skin flora.    # Diarrhea  # c/b CMV Viremia  # Hx prolonged norovirus infection  -Extended hospitalization at both [place] 1/23-04/16/22, with hypovolemic shock associated with norovirus, acute liver failure associated with pomylast, CMV colitis.   08/2021 - first +norovirus with persistent +norovirus s/p 7-day courses x 2 nitazoxanide (2/16-02/22/23 & 01/2022), but norovirus continued to be positive (most recently 04/07/22)- subsequent clearance of norovirus per test 6/9,   -Regarding CMV colitis, 12/17/21 flex sig showed CMV colitis, sp multiple cycles of gancyclovir IV, IVIG, valcyte 900 mg PO BID, then ultimately transitioned to letermovir, which she continues. Her CMV (iu/mL) 443,961--> 179,555--> 74 (2/22) --> 176 (3/1) --> 105 (6/9) --> 70 (6/20)  -At home was taking loperamide BID and diarrhea was controlled to 2-3 times daily. Diarrhea had improved to 1 stool on 6/11, but worsened on 6/12 (11 stools). Possibly r/t to inadequate dosing of loperamdie vs infectious etiology. GI c/s 6/13 for possible scope - per patient and family declined endoscopy in favor of supportive medical interventions and to revisit endoscopy if diarrhea doesn't improve.    6/14 - Given CMV stable and norovirus negative and enteric panel unrevealing - favor non-infectious etiologies for diarrhea      Data:  6/9: C. Diff neg, GI bacterial/viral/parasite panels neg, norovirus neg     Plan  -Continue letermovir indefinitely per Dr Jacelyn Grip on 6/21 (6/16- )  - Trend weekly CMV PCR, next draw 6/27  - Patient and family defer endoscopy (offered by GI previously)  - Revisit if medical mgmt ineffective, but currently back to baseline (2-3 stools in 24 hours) per daughter  - Continue loperamide q6h ATC  - Continue tincture of opium QID PRN  - Defer ID consultation in absence of new ID findings on enteric workup     # Acute liver failure, now requiring weekly paracentesis  # Pericentral/sinusioidal and periportal fibrosis, ductal reaction with focal pericholangitis from vanishing bile duct  in the setting of pomalidomide.   # Hepatic Encephalopathy  Diagnostic paracentesis negative for SBP. Malnutrition may also be contributing to ascites (see below).    Dx:  - 6/17 KUB: mild ileus and density over abdomen (likely ascitic fluid) - not clinically significant as patient is having regular bowel movements and no PVR noted.  - 6/20 therapeutic paracentesis by TAPS, -4L + albumin    Plan:  -Continue spironolactone ('100mg'$ ) / lasix ('40mg'$ ) to minimize accumulation of ascites   (started 6/16, increased 6/19- )  Spironolactone w/ hold parameters for hypotension, consider reduced dose  -Continue Ursodiol 600 mg PO BID  -Continue rifaximin 550 mg PO QD  -Continue midodrine 10 mg PO TID  -Low sodium diet  -2L fluid restriction     # Failure to thrive  # PEG tube dependence  # Macrocytic anemia - B12 deficiency  PEG tube placed during last admission. Low B12 level but MMA normal, which is not consistent with B12 deficiency, however still treating at this time as below.  -Continue TF - Nutren 1.5 - goal 80m/hr, at goal  -Continue cyanocobalamin 10070m SQ daily x7 days (6/17-6/23) , then weekly (6/24- )  -Pending copper level    # Dysphagia  Dysphagia and aspiration risk assessed by speech language pathology. SLP recommended Modified  Barium Swallow (completed on 6/21) which showed moderate pharyngeal aspiration; updated diet to regular diet w/ mildly thickened liquids. Appreciate SLP recs.   - See failure to thrive section as well re: PEG tube and nutritional deficiencies     # Pain  # Abdominal cramping   # Mental anguish  Discussed on 6/21 and pain r/t pressure injuries as well as abdominal pain limit pt's participation in PT/OT. Recommended pain management prior to/after PT/OT sessions to improve tolerance.   -Consulted Palliative Care 6/17 for SMS  -Continue hydrocodone-acetaminophen PRN  -Continue hydromorphone PRN  -Increased mirtazapine to '30mg'$  nightly (6/18) per palliative recs     # Physical Deconditioning  -PT/OT - recommend placement SNF    VTE PPx:  Contraindicated: Anticipated procedure    Severity of Illness  Stable    Nutritional Assessment  Severe Chronic disease or condition related malnutrition    During this hospitalization the patient is also being treated for:  - Anemia associated with malignancy (present on admission)    - Thrombocytopenia (present on admission)    - Coagulopathy (present on admission)    - Malignancy associated fatigue on admission  - Neoplasm/malignancy associated pain  - Hypoalbuminemia (present on admission)  - HYPOnatremia (present on admission)  - Fluid status: Volume overload on admission  - Cachexia    Code Status: FULL      I spent a total time of 70 minutes in this episode of care preparing to see the patient, obtaining and/or reviewing separately obtained history, performing a medically appropriate examination and/or evaluation, counseling and educating patient/family/caregiver, ordering and reviewing medications, tests, or procedures, referring and communicating with other health care professionals, documenting clinical information in the electronic or other health record, and communicating results to the patient/family/caregiver, and/or other care coordination.    MeJohnnette Litter NP  05/05/22

## 2022-05-05 NOTE — Plan of Care (Signed)
Problem: Discharge Planning - Adult  Goal: Knowledge of and participation in plan of care  Outcome: Progress within 12 hours     Problem: Fall, at Risk or Actual - Adult  Goal: Absence of falls and fall related injury  Outcome: Progress within 12 hours     Problem: Delirium - Adult / Pediatric  Goal: Absence or resolution of delirium  Outcome: Progress within 12 hours     Problem: Nutrition, Alteration in -Adult  Goal: Adequate nutritional intake  Outcome: Progress within 12 hours  Goal: Maximize nutritional intake per patient condition  Outcome: Progress within 12 hours     Problem: Pressure Injury,At Risk and Actual- Adult  Goal: Absence of any new pressure injury  Outcome: Progress within 12 hours  Goal: Pressure injury healing/stabilization  Outcome: Progress within 12 hours  Goal: Participation in preventative efforts and treatment plan (Patient/Family/Caregiver)  Outcome: Progress within 12 hours  Goal: Adequate nutritional intake  Outcome: Progress within 12 hours     Problem: GI Elimination Impaired - Adult  Goal: Passage of soft, formed stool  Outcome: Progress within 12 hours  Goal: Stool elimination per clinical condition ( e.g. GI ostomies )  Outcome: Progress within 12 hours

## 2022-05-05 NOTE — Consults (Addendum)
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 SLP consult for liberalization to diet and further assessment.  Swallowing history: No history of dysphagia  Recent Imaging: 05/01/2022 XR KUB, FLAT PLATE, ABDOMEN 1 VIEW: Lines/drains/medical devices: Rounded density projects over the stomach. Gaseous distention of the stomach, few loops of small bowel and transverse colon consistent with mild ileus.     Multiple sclerotic osseous lesions, better characterized on prior CT and consistent with multiple myeloma.  Other history: Gtube iso FTT.  Patient Status  Positioning: Upright  Cognition: Alert;Cooperative  Communication: Functional for expression of basic wants, needs, and thoughts  Respiratory: Room air  Patient status comment(s): Pt. was cooperative and welcoming to SLP at time of MBSS.  Lobbyist Needed?: No      Fluoroscopic Swallow Examination  Views  Projections: A-P;Lateral  Deviations in Anatomic Structures  Abnormalities observed: (P) None  Epiglottis abnormality: (P) Partial inversion of epiglottis.  Trials  Level 0 (Thin): Cup;Self-administered  Level 2 (Mildly Thick): Cup;Self-administered  Level 4 (Puree): Spoon;Self-administered  Regular: Self-administered  Oral Phase Residue  Oral residue: (P) None  Oral Phase Physiologic Impairments  Oral impairments: Bolus preparation/mastication  Bolus preparation/mastication description: Slow and prolonged chewing/mashing with complete re-collection  Oral phase impairment comment(s): xerostomia likely impacting  Pharyngeal Residue  Pharyngeal residue: (P) Yes  Base of tongue: (P) Bilateral  Bilateral- base of tongue: Trace/coating  Posterior pharyngeal wall: Bilateral  Bilateral- posterior pharyngeal wall: Trace/coating  Valleculae: (P)  Bilateral  Bilateral- valleculae: Collection of residue  Pyriform sinuses: Bilateral  Bilateral- pyriform sinuses: Trace/coating  Pharyngeal Phase Physiology  Pharyngeal phase physiologic components: Details  Soft palate elevation: No bolus between soft palate/pharyngeal wall  Laryngeal elevation: Partial superior movement of thyroid cartilage/partial approximation of arytenoids to epiglottic petiole  Anterior hyoid excursion: Partial anterior movement  Epiglottic movement: No inversion  Laryngeal vestibule closure - height of swallow: Incomplete, narrow column air/contrast in laryngeal vestibule  Pharyngeal stripping wave: Diminished  Pharyngoesophageal segment opening: Complete distension and complete duration, no obstruction of flow  Tongue base retraction: Trace column of contrast or air between tongue base and posterior pharyngeal wall  Penetration/Aspiration  Level 0 (Thin) liquid: Aspiration  Level 0 (Thin) liquid aspiration: During swallow attempt with no response;During swallow attempt with delayed cough  Level 2 (Mildly Thick) liquid: Penetration  Level 2 (Mildly Thick) liquid penetration: After swallow attempt with no response  Level 4 (Puree): Penetration  Level 4 (Puree) penetration: After swallow attempt with no response  Worst pen-asp scale score: 8 - Material enters the airway, passes below the vocal folds, and no effort is made to eject     Strategies  Chin tuck: Ineffective  Effortful swallow: Ineffective  Liquid wash: Effective    Assessment  Assessment  Impressions: MBSS completed and yielding appearance of mild-moderate oropharyngeal dysphagia impacting both safety and efficiency.  Routine trace aspiration on thins during the swallow, sensate ~ 50% of the time w/ poor clearance of aspirated material w/ reflexive cough; cued volitional cough effective at clearing majority of aspirated material.  Mild-moderate diffuse pharyngeal residue (increased viscosities > thin) improved w/ f/u swallow and  liquid wash.  Although not noted during exam, concern for potential aspiration on residue after the swallow.  Recommend GOC at this time.  Recommend a regular  diet and mildly thickened liquids using strict precautions + strategies w/ free water protocol outside of meals vs. continued ETC diet w/ thin liquids w/ precautions + strategies if reducing aspiration risk is priority.  1:1 assist for oral care prior to PO; upright positioning; small single bites/sips; 3-5 f/u swallows; liquid wash; slow pacing; prophylactic cough on all sips of thin and used every few bites/sip + re-swallow.  SLP will f/u for repeat education and therapeutic support.  Recommend repeat imaging in 1-2 months to support further diet recommendations.  Current Functional Level  Domain: Swallowing  Swallowing: <Level 5>  Mild dysphagia: Distant supervision, may need one diet consistency restricted  Rehab Potential  Rehab potential judged to be: Good  Facilitators: Motivation;Family/community support;Insight into deficits  Barriers: Co-morbidities    Recommendations  Swallowing Recommendations  Solids: Regular  Liquids: Level 2 (Mildly thick);Water for pleasure  Liquid administration: Cup;Spoon  Medication administration: Non-oral  Supervision during meals: 1:1 assistance  Mealtime strategies and positioning: Upright;Remain upright 30 minutes after eating;Alternate solids and liquids;Small bites;Small single sips;Slow pace  Meal strategy comment(s): take small bites,3-5 swallows per bite/sip, and practice oral care prior to all PO.  prophylatic cough                Plan  Plan  Plan: See listed goals;Continue plan of care with updated goals  Frequency  Frequency: 3x/week  Short Term Goals  Short Term Goals: The patient will safely and efficiently ingest 100% of therapeutic trials to determine appropriate diet, at the discretion of the SLP;The patient/family/caregiver will identify and demonstrate compensatory swallow strategies and/or precautions to  improve safe and efficient swallowing with 100% accuracy  Long Term Goals  Long Term Goals: Improve safe oral intake and/or maintain/improve swallow function to prevent negative outcomes;Increase patient/family/caregiver education  Discharge Recommendations  Discharge Recommendations: Placement for continued intensive therapy upon discharge with PT/OT input;Outpatient SLP        Abelardo Diesel, SLP  05/05/2022

## 2022-05-05 NOTE — Consults (Signed)
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 Pt focused session on toileting and toilet transfers OOB vs bed pan. Pt requires min A for rolling but increased time with sling placement 2/2 impaired skin. Pt tolerated toileting transfer with hoyer lift to [facility] well and able to void on BSC. Pt continues to p/w increased fatigue and over the weekend started flipping day/night schedule. Pt poses a high risk for delirium and further confusion due to impaired sleep schedule. Pt requires max A for ADls but very engaged and motivated today. Plan to focus next session on EOB sitting balance and progress to scooting. Recommend placement.  Progress with OT: Good progress  Current maximal level of assist: Maximal assist  Next session focus:      Recommendations    Recommended Discharge Disposition Placement for continued therapy   Discharge DME Recommendations Defer to next level of care   Equipment Recommendations     DME Comment     Discharge Recommendations Comment     Rehab Potential Patient participates well in therapy and is progressing towards goal   Anticipated Assistance Available at Discharge Family;Spouse/Significant other   Anticipated Type of Assistance Available at Discharge Assist as needed   Anticipated Time of Assistance Available at Discharge     Barriers to Discharge Medical issues   Patient's current functional ability appropriate for D/C recommendations Yes     Inpatient Recommendations  OT Inpatient Recommendations     Recommendation Comments Hoyer lift to high back chair   Current Maximal Level of Assist Needed Maximal assist     Brace/Precautions   Brace/Orthotic/Prosthetic:   Precautions:Has weight bearing limitation or precaution: Yes  Precautions  and weight bearing status comments: Falls, delirium, skin, aspiration, contact     Subjective Report:"Have you heard what's been happening? Tornadoes!"    Patient/Family Goal:     Objective Findings and Interventions    Areas of Occupation    Curator Required     Comment     Self Feeding     Cueing Required     Comment     Toileting Dependent   Cueing Required     Toileting Clothing Management Maximal assistance   Cueing Required     Comment min A rolling, max A for sling. Maximove 2p transfer to [facility], able to urinate max A pericare. Sling left on for trunk control and safety   Upper Body Dressing     Cueing Required     Comment     Lower Body Dressing     Cueing Required     Comment     Upper Body Bathing     Cueing Required     Comment     Lower Body Bathing     Cueing Required     Comment     ADL/IADL Comment         Functional Transfers    Bed Mobility From Supine;Sidelying   Bed Mobility To Sidelying;Supine   Level of Assist Minimal assistance   Cueing Required     Transfer From Bed   Transfer To Chair;Commode - drop arm   Level of Assist Dependent   Cueing Required  Technique     Functional Transfer Comment Min A rolling in bed, pt agreeable to use BSC vs. bedpan. 2p hoyer lift transfer to [facility]. Pt able to urinate in BSC, max A pericare. Pt transferred to high back chair.     Functional Balance for ADLs  Position Static Dynamic   Sit Static sitting level of assist: Moderate assist-   Static sitting comment: sling left on during static sit on [facility] for posterior support and safety  -        Stand  -      -            [place] for ADLs  6 Click Score: 14    Client Factors     Cognition comment:More alert today, reported recognizing therapist. Pt confused regarding movie as being live news. Delayed processing.                              Roma Schanz, OT  05/05/2022 1:53 PM

## 2022-05-05 NOTE — Consults (Signed)
PHYSICAL THERAPY CANCELED SESSION NOTE     Charting Type: Treatment  Cancellation: Cancelled session  PT session missed for the following reasons (S): Patient at Procedure/Imaging  Missed/Cancelled Session Comment: Pt is in swallow test.    Berlinda Last, PT  05/05/2022

## 2022-05-06 LAB — BASIC METABOLIC PANEL (NA, K, CL, CO2, BUN, CR, GLU, CA)
Anion Gap: 5 (ref 4–14)
Calcium, total, Serum / Plasma: 7.8 mg/dL — ABNORMAL LOW (ref 8.4–10.5)
Carbon Dioxide, Total: 29 mmol/L (ref 22–29)
Chloride, Serum / Plasma: 106 mmol/L (ref 101–110)
Creatinine: 0.62 mg/dL (ref 0.55–1.02)
Glucose, non-fasting: 113 mg/dL (ref 70–199)
Potassium, Serum / Plasma: 4.1 mmol/L (ref 3.5–5.0)
Sodium, Serum / Plasma: 140 mmol/L (ref 135–145)
Urea Nitrogen, Serum / Plasma: 27 mg/dL — ABNORMAL HIGH (ref 7–25)
eGFRcr: 101 mL/min/{1.73_m2} (ref >59)

## 2022-05-06 LAB — COMPLETE BLOOD COUNT WITH DIFFERENTIAL
Abs Basophils: 0.01 10*9/L (ref 0.00–0.10)
Abs Eosinophils: 0.02 10*9/L (ref 0.00–0.40)
Abs Imm Granulocytes: 0.1 10*9/L — ABNORMAL HIGH (ref <0.10)
Abs Lymphocytes: 0.85 10*9/L — ABNORMAL LOW (ref 1.00–3.40)
Abs Monocytes: 0.61 10*9/L (ref 0.20–0.80)
Abs Neutrophils: 4.05 10*9/L (ref 1.80–6.80)
Hematocrit: 25.1 % — ABNORMAL LOW (ref 36.0–46.0)
Hemoglobin: 7.7 g/dL — ABNORMAL LOW (ref 12.0–15.5)
MCH: 33 pg (ref 26.0–34.0)
MCHC: 30.7 g/dL — ABNORMAL LOW (ref 31.0–36.0)
MCV: 108 fL — ABNORMAL HIGH (ref 80–100)
MPV: 12.9 fL — ABNORMAL HIGH (ref 9.1–12.6)
Platelet Count: 102 10*9/L — ABNORMAL LOW (ref 140–450)
RBC Count: 2.33 10*12/L — ABNORMAL LOW (ref 4.00–5.20)
RDW-CV: 17.5 % — ABNORMAL HIGH (ref 11.7–14.4)
WBC Count: 5.6 10*9/L (ref 3.4–10.0)

## 2022-05-06 LAB — BACTERIAL CULTURE, STERILE SITES, WITH GRAM STAIN
Bacterial Culture, Sterile Sites: NO GROWTH
Gram Stain: NONE SEEN

## 2022-05-06 LAB — ALANINE TRANSAMINASE: Alanine transaminase: 22 U/L (ref 10–61)

## 2022-05-06 LAB — BILIRUBIN, TOTAL: Bilirubin, Total: 0.7 mg/dL (ref 0.2–1.2)

## 2022-05-06 LAB — GAMMA-GLUTAMYL TRANSPEPTIDASE: Gamma-Glutamyl Transpeptidase: 93 U/L — ABNORMAL HIGH (ref 8–59)

## 2022-05-06 LAB — PHOSPHORUS, SERUM / PLASMA: Phosphorus, Serum / Plasma: 3.2 mg/dL (ref 2.3–4.7)

## 2022-05-06 LAB — MAGNESIUM, SERUM / PLASMA: Magnesium, Serum / Plasma: 1.7 mg/dL (ref 1.6–2.6)

## 2022-05-06 LAB — ALKALINE PHOSPHATASE: Alkaline Phosphatase: 220 U/L — ABNORMAL HIGH (ref 38–108)

## 2022-05-06 LAB — ASPARTATE TRANSAMINASE: AST: 53 U/L — ABNORMAL HIGH (ref 5–44)

## 2022-05-06 MED FILL — ZINC ACETATE DIHYDRATE (BULK) 100 % CRYSTALS: 100 % | Qty: 0.08

## 2022-05-06 MED FILL — THIAMINE MONONITRATE (VITAMIN B1) 100 MG TABLET: 100 mg | ORAL | Qty: 1

## 2022-05-06 MED FILL — ONDANSETRON HCL 4 MG TABLET: 4 mg | ORAL | Qty: 2

## 2022-05-06 MED FILL — HYDROXYZINE HCL 25 MG TABLET: 25 mg | ORAL | Qty: 1

## 2022-05-06 MED FILL — URSODIOL (BULK) 100 % POWDER: 100 % | Qty: 0.6

## 2022-05-06 MED FILL — MIRTAZAPINE 30 MG TABLET: 30 mg | ORAL | Qty: 1

## 2022-05-06 MED FILL — PROCHLORPERAZINE EDISYLATE 10 MG/2 ML (5 MG/ML) INJECTION SOLUTION: 10 mg/2 mL (5 mg/mL) | INTRAMUSCULAR | Qty: 2

## 2022-05-06 MED FILL — MAG-AL PLUS 200 MG-200 MG-20 MG/5 ML ORAL SUSPENSION: 200-200-20 mg/5 mL | ORAL | Qty: 30

## 2022-05-06 MED FILL — ANTI-DIARRHEAL (LOPERAMIDE) 2 MG TABLET: 2 mg | ORAL | Qty: 2

## 2022-05-06 MED FILL — XIFAXAN 550 MG TABLET: 550 mg | ORAL | Qty: 1

## 2022-05-06 MED FILL — CYANOCOBALAMIN (VIT B-12) 1,000 MCG/ML INJECTION SOLUTION: 1000 mcg/mL | INTRAMUSCULAR | Qty: 1

## 2022-05-06 MED FILL — SPIRONOLACTONE 100 MG TABLET: 100 mg | ORAL | Qty: 1

## 2022-05-06 MED FILL — TAB-A-VITE 400 MCG TABLET: 400 mcg | ORAL | Qty: 1

## 2022-05-06 MED FILL — LANSOPRAZOLE 3 MG/ML ORAL SUSPENSION COMPOUNDING KIT: 3 mg/mL | ORAL | Qty: 10

## 2022-05-06 MED FILL — FUROSEMIDE 20 MG TABLET: 20 mg | ORAL | Qty: 2

## 2022-05-06 MED FILL — HYDROCODONE 5 MG-ACETAMINOPHEN 325 MG TABLET: 5-325 mg | ORAL | Qty: 2

## 2022-05-06 MED FILL — PREVYMIS 480 MG TABLET: 480 mg | ORAL | Qty: 1

## 2022-05-06 MED FILL — FOLIC ACID 1 MG TABLET: 1 mg | ORAL | Qty: 1

## 2022-05-06 MED FILL — CHOLECALCIFEROL (VITAMIN D3) 25 MCG (1,000 UNIT) TABLET: 1000 UNITS | ORAL | Qty: 2

## 2022-05-06 MED FILL — ACETAMINOPHEN 325 MG TABLET: 325 mg | ORAL | Qty: 2

## 2022-05-06 MED FILL — HYDROCODONE 5 MG-ACETAMINOPHEN 325 MG TABLET: 5-325 mg | ORAL | Qty: 1

## 2022-05-06 MED FILL — DILAUDID (PF) 0.5 MG/0.5 ML INJECTION SYRINGE: 0.5 mg/ mL | INTRAMUSCULAR | Qty: 0.5

## 2022-05-06 NOTE — Consults (Signed)
Treatment Note    Interval History  Interval History  Interval History: 6/21 MBSS completed and yielding appearance of mild-moderate oropharyngeal dysphagia impacting both safety and efficiency. Routine trace aspiration on thins during the swallow, sensate ~ 50% of the time w/ poor clearance of aspirated material w/ reflexive cough; cued volitional cough effective at clearing majority of aspirated material. Mild-moderate diffuse pharyngeal residue (increased viscosities > thin) improved w/ f/u swallow and liquid wash. Although not noted during exam, concern for potential aspiration on residue after the swallow.  Language Assistant  Interpreter Needed?: No    Subjective  Subjective  Patient subjective: Pt. was asleep at the time of education. Daughter was welcoming to SLP services and reccomendations.        Ariel Braun Pain Scale  Pain Level Upon Arrival: No hurt  Highest Pain Level During Therapy: Hurts a little bit  Pain Level End of Therapy: Hurts a little bit                                     Objective  Short term goal(s) progress: MBSS findings and imaging reviewed with pt's daughter. Family members were educated on IDSSI levels for pt (7/2) and aspiration precautions. Daughter noted preference of thickening liquid herself for increased options in liquids for pt and increased carryover. Recommendations for therapeutic interventions and outpatient SLP services reviewed. Family members agreed to outpatient SLP services with recommend secondary imaging in 1-2 months to assess swallow progress.    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  Recommend continue a regular diet w/ mildly thickened liquids; pills via PEG.  Daughter requesting liquids be delivered thin and will self-thicken w/ brought in thickening agent, aware of  NTL consistency 2/2 for liberalization of liquid options.  1:1 assist for Upright 90 degrees; small single bites/sips; encourage 3-5 swallows; alternate bite/sips; slow pacing; cued cough + re-swallow every few bites/sips; oral care prior to PO; remain upright 30 minutes post meals.  Allow ice/thin water outside of meals after oral care w/ Pt encouraged to cough + re-swallow after each trial.  SLP will follow up for therapeutic intervention w/ OP SLP warranted upon DC; repeat MBSS in 1-2 months or w/ clinical improvement.  Current Functional Level  Domain: Swallowing  Swallowing: <Level 4>  Mild-moderate dysphagia: Intermittent supervision/cueing, one or two consistencies restricted  Rehab Potential  Rehab potential judged to be: Good  Facilitators: (P) Motivation;Family/community support;Insight into deficits  Barriers: Co-morbidities    Recommendations  Swallowing Recommendations  Solids: Regular  Liquids: Level 2 (Mildly thick)  Liquids recommendation comment(s): Daughter prefers to thicken liquids with personal powder for consistency. When daughter is not pt., consent provided to provide with mildly thick liquids provided by hospital.  Liquid administration: Cup;Spoon  Medication administration: Non-oral  Supervision during meals: 1:1 assistance  Mealtime strategies and positioning: Upright;Remain upright 30 minutes after eating;Alternate solids and liquids;Small bites;Small single sips;Slow pace  Meal strategy comment(s): take small bites,3-5 swallows per bite/sip, and practice oral care prior to all PO.  prophylatic cough                Plans and Goals  Plan: Continue per outlined plan of care  Frequency: 2x/week    Discharge Recommendations: Outpatient SLP  Updated Short Term Goals    05/03/22 1324 05/06/22 1316   Short Term Goals- Updated: The patient/family/caregiver will identify and demonstrate compensatory swallow strategies and/or precautions to improve safe and efficient swallowing with 100%  accuracy The patient/family/caregiver will identify and demonstrate compensatory swallow strategies and/or precautions to improve safe and efficient swallowing with 100% accuracy       Ariel Braun  05/06/2022

## 2022-05-06 NOTE — Consults (Signed)
PHYSICAL THERAPY TREATMENT NOTE    PT relevant HPI  43F MM, chronic diarrhea (CMV colitis vs norovirus infection), acute liver injury, admitted for persistent diarrhea and FTT    Medical Update:        ASSESSMENT  Continues to demonstrate appreciable gains. Pt had productive OT session. Encouraged frequent EOB activities with rehab. Alternating somnolence. Max A for supine to short sitting. KUSBS of 1+. Orthostatic tolerant tolerates sitting EOB ~ 3 mins. Cont PT POC. Thank you for this referral.    Focus next session: Sitting tolerate EOB, trial STEDY when appropriate    RECOMMENDATIONS  DISCHARGE RECOMMENDATION  Comments Placement with ongoing PT needs  pending medical optimization and progress while in house   Patient Current Functional Status Sufficient for PT Discharge Recommendation No   Discharge DME needs hospital bed, [facility] lift, wheelchair   Discharge transportation needs [facility] Potential Patient participates well in therapy and progressing towards goals     NURSING RECOMMENDATIONS  Inpatient Rehab Assistive Device Recommendation Vertical dependent lift;Ceiling lift;Lateral transfer device   Activity Recommendation up ad lib with nursing     SUBJECTIVE  Subjective report:  Agreeable. PLeasant  Notable observations:  Left supine in bed. Dtr is present.    SYSTEMS REVIEW  Unremarkable since the last full evaluation or reevaluation.        Pain     Currently in pain: Yes  Pain location: abdominal pain  Pain scale: Wong-Baker     Pain Level Upon Arrival: No hurt  Highest Pain Level During Therapy: Hurts a little bit  Pain Level End of Therapy: Hurts a little bit    COMPREHENSIVE MOVEMENT ANALYSIS/TREATMENT  Precautions/WB status: Yes  Precautions and weight bearing status comments: Falls, delirium, skin, aspiration, contact      Functional Mobility     Balance  Balance deficits noted: Yes  Functional Balance for ADLs  Position Static Dynamic   Sit Static sitting level of assist: Maximal assist-    Static sitting comment: Able to sustain static sit with bilateral UE support for 1 minute  -        Stand  -      -          Communication between other health care providers: Communication between other health care provider: RN  Communication comment: goals of therapy discussion; pt's functional status     Education assessment  Learner: Patient, Caregiver  Content: Plan of care, Activity recommendations, Discharge recommendations  Response: Needs reinforcement, Verbalizes understanding, Demonstrates understanding  Outcome measures   Physical Therapist Global Assessment of Mobility  Activity Achieved: Passive ROM  AMPAC 6-clicks basic mobility score: 8      PLAN  Plan of care status:  Current plan of care remains appropriate  PT frequency:  5x/week  PT duration:  4 weeks.  Comment:  POC expires 7/10      Planned PT interventions:   Specific interventions: Progressive functional mobility training;Balance training;NM Re-ed;Aerobic training;Ther ex  Education interventions: Fall risk reduction;Exercise program;Self-pacing/breathing;Benefits of activity;Caregiver training  Comment:          Berlinda Last, PT    05/06/2022

## 2022-05-06 NOTE — Consults (Signed)
OCCUPATIONAL THERAPY PROGRESS NOTE      Diagnosis and brief medical history: 26F MM, chronic diarrhea (CMV colitis vs norovirus infection), acute liver injury, admitted for persistent diarrhea and FTT    Assessment and Treatment Plan    OT Progress summary: Pt continues to be pleasant and agreeable to OT. Pt tolerated EOB for 15 min engaging in ADLs. Pt demonstrated globally impaired trunk strength, balance and endurance in static sit likely due to prolonged bedrest and deconditioning. Pt p/w improved engagement in ADLs and cooperative. Pt continues to require hoyer lift transfer due to LE weakness and tolerates activity in chair well additional 20 min during session. Pt left in chair for lunch. Pt requires max A for ADLs and continues to make gains with OT. Recommend placement.  Progress with OT: Good progress  Current maximal level of assist: Maximal assist  Next session focus:      Recommendations    Recommended Discharge Disposition Placement for continued therapy   Discharge DME Recommendations Defer to next level of care   Equipment Recommendations     DME Comment     Discharge Recommendations Comment     Rehab Potential Patient participates well in therapy and is progressing towards goal   Anticipated Assistance Available at Discharge Family;Spouse/Significant other   Anticipated Type of Assistance Available at Discharge Assist as needed   Anticipated Time of Assistance Available at Discharge Unknown at this time   Barriers to Discharge Medical issues   Patient's current functional ability appropriate for D/C recommendations Yes     Inpatient Recommendations  OT Inpatient Recommendations Bed in chair position;Encourage participation in ADLs;Delirium precautions;Out of bed for meals and ADLs;Frequent turns   Recommendation Comments Hoyer lift to high back chair   Current Maximal Level of Assist Needed Maximal assist     Brace/Precautions   Brace/Orthotic/Prosthetic:   Precautions:Has weight bearing limitation  or precaution: Yes  Precautions and weight bearing status comments: Falls, delirium, skin, aspiration, contact     Subjective Report:"This is really hard"    Patient/Family Goal:     Objective Findings and Interventions    Areas of Occupation    Grooming and Light Hygiene Contact guard assistance   Cueing Required     Comment wash face with setup and posterior support. Pt able to setup toothpaste and toothbrush, oral care performed.   Self Feeding Supervision/safety;Set up   Cueing Required     Comment min A to start peeling orange then able to complete. Setup for thick apple juice   Toileting     Cueing Required     Toileting Clothing Management     Cueing Required     Comment     Upper Body Dressing     Cueing Required     Comment     Lower Body Dressing Maximal assistance   Cueing Required     Comment don socks   Upper Body Bathing     Cueing Required     Comment     Lower Body Bathing     Cueing Required     Comment     ADL/IADL Comment         Functional Transfers    Bed Mobility From Supine   Bed Mobility To Sitting EOB   Level of Assist Maximal assistance   Cueing Required     Transfer From Sit   Transfer To Chair   Level of Assist Dependent   Cueing Required     Technique  Functional Transfer Comment Pt max A 2p assist. Pt tolerated sitting EOB for 15 min focusing on trunk control, sitting balance. Pt able to sustain static sit for 10 seconds before losing balance posteriorly. Pt able to sustain static sit using bilateral UEs support on therapist. Pt engaged in grooming. Pt hoyer lift transfer to chair. Pt engaged in feeding in chair.     Functional Balance for ADLs  Position Static Dynamic   Sit Static sitting level of assist: Maximal assist-   Static sitting comment: Able to sustain static sit with bilateral UE support for 1 minute  -        Stand  -      -            [employer] [facility]	[place] AM-PAC for ADLs  6 Click Score: 14    COT Plan of Care  Planned OT Interventions: Activities of Daily Living  retraining;Energy conservation education;Functional transfer training;Assistive/Adaptive device training;Cognitive re-training;Pain Management;Patient/Family/Caregiver education;Therapeutic exercise training;Delirium management;Mental health and wellness                Roma Schanz, OT  05/06/2022 12:43 PM

## 2022-05-06 NOTE — ACP (Advance Care Planning) (Signed)
DATE OF DISCUSSION: 05/06/22  Parties present: Garen Grams, Gray Bernhardt, Cleaster Corin, Kennon Rounds, MD  Patient designated surrogate decision-maker: daughter, [name]     DETAILS OF DISCUSSION :    Met with pt and family at bedside to discuss goals of care. Shaelin continued to express she wishes to continue to pursue full medical management for her ongoing medical problems. We discussed that she is presently not a candidate for treatment for multiple myeloma due to deconditioning. Her myeloma is presently stable, without treatment since 10/2021, but should it progress she would not be eligible for treatment and this would be life-threatening.     She expressed her desire to prolong her life so that she can spend time with family, and she agrees to engage with PT/OT to improve her performance status in hopes of eventually becoming eligible for treatment.     - Regarding life-sustaining therapy/escalation of care and at this time the patient wants all possible interventions to be done to lengthen life including but not limited to ICU admission, intubation, chest compressions, mechanical ventilation and defibrillation        PREFERENCES FOR LIFE-SUSTAINING THERAPY:  Cardio-pulmonary resuscitation (CPR):  Yes  Intubation and mechanical ventilation:  Yes  Escalation of care:  Yes    POLST: COMPLETED:  No    Plan:  - We will update this note  as we continue to clarify and align with patient and family's goals of care.

## 2022-05-06 NOTE — Progress Notes (Signed)
[facility] PROGRESS NOTE     This is an independent service.   The available consultant for this service is [facility] Carolynn Serve, MD.           24 Hour Course  - No acute events overnight, HDS, afebrile  - Diarrhea resolved, having 1 soft stool daily on current regimen  - Continues work w/ SLP, PT, OT    Subjective  Feeling overall better today, has not had a watery stool in 2 days. Feels her abdominal pain is much better. Does not have much appetite but amenable to eating more solids. Had good sessions w/ PT & OT yesterday.     Vitals  Temp:  [36.8 C (98.2 F)-37.3 C (99.1 F)] 37.1 C (98.8 F)  Heart Rate:  [88-97] 88  *Resp:  [15-16] 16  BP: (103-105)/(70-73) 105/73  SpO2:  [96 %-100 %] 96 %    MostRecent Weight: 69 kg (152 lb 1.9 oz)  Admission Weight: 66.2 kg (146 lb)      Intake/Output Summary (Last 24 hours) at 05/06/2022 1150  Last data filed at 05/06/2022 0745  Gross per 24 hour   Intake 705 ml   Output 500 ml   Net 205 ml       Physical Exam  Gen: NAD, alert and oriented x4, answers questions slowly but appropriate and engaged  HEENT: MMM, no oral lesions  Resp: normal work of breathing  Card: RRR  Gi: Mildy distended, soft, non-tender  Ext: negative BLE edema   Skin: scattered ecchymosis to BUE  Lines: PIV x1 in place, clean dry & intact dressing    Scheduled Meds:   0.9% sodium chloride flush  3 mL Intravenous Q12H [place]    cholecalciferol (vitamin D3)  2,000 Units Per G Tube Daily [place]    cyanocobalamin (Vitamin B12)  1,000 mcg Subcutaneous Daily [place]    Followed by    Derrill Memo ON 05/08/2022] cyanocobalamin (Vitamin B12)  1,000 mcg Subcutaneous Q7 Days    ergocalciferol (vitamin D2)  50,000 Units Per G Tube Q7 Days    folic acid  1 mg Per G Tube Daily [place]    furosemide  40 mg Per G Tube Daily [place]    lansoprazole  30 mg Per G Tube Q AM Before Breakfast [facility]    letermovir  480 mg Other Daily [place]    loperamide  4 mg Feeding Tube Q6H [place]    mirtazapine  30 mg Per G Tube Daily At Bedtime [facility]     multivitamin  1 tablet Per G Tube Daily [place]    ondansetron  8 mg Per G Tube TID    rifAXIMin  550 mg Feeding Tube BID [facility]    spironolactone  100 mg Per G Tube Daily [place]    thiamine mononitrate (vit B1)  100 mg Per G Tube Daily [place]    ursodiol  600 mg Per G Tube BID [facility]    zinc acetate  25 mg of elemental zinc (Zn) Per G Tube BID [place]     Continuous Infusions:  PRN Meds:   0.9% sodium chloride flush  3 mL Intravenous PRN    acetaminophen  650 mg Per G Tube Q8H PRN    albuterol  2.5 mg Nebulization Q4H PRN    Or    albuterol  2.5 mg Nebulization Q4H PRN    aluminum-magnesium hydroxide-simethicone  15 mL Per G Tube Q4H PRN    HYDROcodone-acetaminophen  1-2 tablet Per G Tube  Q6H PRN    HYDROmorphone  0.2-0.4 mg Intravenous Q4H PRN    HYDROmorphone  0.4 mg Intravenous Once PRN    hydrOXYzine  25 mg Feeding Tube Q4H PRN    opium  6 mg Oral 4x Daily PRN    prochlorperazine  5 mg Intravenous Q8H PRN    Or    prochlorperazine  5 mg Per G Tube Q8H PRN       Data    CBC        05/06/22  0542 05/05/22  0548   WBC 5.6 4.7   HGB 7.7* 7.6*   HCT 25.1* 25.0*   PLT 102* 84*       Coags  No results found in last 36 hours  Chem7        05/06/22  0542 05/05/22  0548   NA 140 141   K 4.1 4.3   CL 106 108   CO2 29 27   BUN 27* 27*   CREAT 0.62 0.60   GLU 113 100       Electrolytes        05/06/22  0542 05/05/22  0548   CA 7.8* 7.9*   MG 1.7 1.7   PO4 3.2 3.4       Liver Panel        05/06/22  0542 05/05/22  0548   AST 53* 49*   ALT 22 20   ALKP 220* 211*   TBILI 0.7 0.7       UA  No results found in last 7 days  Microscopy:  No results found in last 7 days    Microbiology Results (last 72 hours)       Procedure Component Value Units Date/Time    Bacterial Culture, Normally Sterile Sites, With Gram Stain [[id number]] Collected: 04/23/22 0028    Order Status: Completed Specimen: Not Applicable from Peritoneal Fluid Updated: 05/06/22 1050     Gram Stain No PMNs seen , Rare Host cells (not PMNs, RBCs, or squamous epithelial cells) , Few RBCs       No organisms seen     Bacterial Culture, Sterile Sites No growth 13 days.    Cytomegalovirus DNA, Quantitative PCR, Plasma [[id number]]  (Abnormal) Collected: 05/04/22 0538    Order Status: Completed Specimen: Blood from Serum Updated: 05/05/22 0912     CMV DNA (IU/mL) 70 IU/mL      Comment: *SUBCRITICAL*  Performed by Real-Time PCR  Note new reference range.          CMV DNA (Log IU/mL) 1.84     Comment: Log10 IU/mL  Note new reference range.                 Radiology Results  No results found.    I discussed the patient with Tilden Fossa, MD from [facility] regarding A&P.    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  2011: VRd bortezomib, Lenalidomide, Dexamethasone  04/2011: Auto SCT  07/2014: Rd - revlimid 15 mg every other day, dexamethasone 8 mg PO weekly.   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   Receiving  dexamethasone 12 mg weekly for diarrhea as outpt  10/16/21 - last Daratumumab dose (on hold due to diarrhea)     DATA:   09/07/2010: IgG Kappa MM  12/02/2021: Ig M <5, IgG 447, IgA 20, Ig E <2, SFLC Kappa 16.6, Lambda 1.6, Kappa/Lamda 10.38  12/14/21: IgG K on IFE, IgG 430, KLC 16.6, LLC 2.7, KLR 6.15  02/23/22 SPEP with stable M-spike, SFLC decreased Kappa 44.2 / Lambda 5.5 = K/L ratio = 8.04 (overall downtrending still)  04/23/22 M-spike 0.6, IgG K on IFE, IgG 997, Kappa 70.7/Lambda 19.8 = K/L ratio = 3.57     Chemo:  TBD, currently off therapy due to recurrent infections.  Not currently a candidate due to significant deconditioning, may be candidate in the future if improved.     Supportive Care:   Transfuse for Hgb <7 and Platelets <10  Access: PIV; consider PICC placement to  manage pain of blood draws     Primary Oncologist:   Dr. Jacelyn Grip ([place]), will transfer to Dr. Hassell Done ([place])  Dr. Iona Coach ([place])     Disposition:  Lives in [place] - anticipate return to home with [facility] vs SNF, currently working with PT  - Patient centered rounds: 6/20, awaiting possible SNF placement vs discharge home.     # Immunocompromised  # Diarrhea  # c/b CMV Viremia  # Hx prolonged norovirus infection  Immunocompromised due to underlying  malignancy, not currently neutropenic. Recent extended hospitalization at both [place] 1/23-04/16/22, with hypovolemic shock associated with norovirus, CMV colitis, acute liver failure associated with pomylast (see ALF section).  Currently admitted for ongoing diarrhea, initially began in 8/22, recently managed at home w/ loperamide BID w/ 2-3 watery stools daily. Diarrhea worsened on 6/12 (11 stools). Possibly r/t to inadequate dosing of loperamdie vs infectious etiology. GI c/s 6/13 for possible scope - per patient and family declined endoscopy at this time.  Given CMV improving, norovirus negative, and enteric panel unrevealing - favor non-infectious etiologies for diarrhea.    Data:  Norovirus  First +norovirus 08/2021 with persistent +norovirus s/p 7-day courses x 2 nitazoxanide (2/16-02/22/23 & 01/2022). Continued to be norovirus positive (most recently on 04/07/22). 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 Her CMV (iu/mL) 443,961--> 179,555--> 74 (2/22) --> 176 (3/1) --> 105 (6/9) --> 70 (6/20)    6/9: C. Diff neg, GI bacterial/viral/parasite panels neg, norovirus neg     Plan  - Continue letermovir indefinitely per Dr Jacelyn Grip on 6/21 (6/16- )  - Trend weekly CMV PCR, next draw 6/27  - Patient and family defer endoscopy (offered by GI previously)  - Revisit if medical mgmt  ineffective; diarrhea currently resolved  - Continue loperamide q6h ATC  - Continue tincture of opium QID PRN  - Defer ID consultation in absence of new ID findings on enteric workup     # Hx Acute liver failure, now requiring paracentesis  # Pericentral/sinusioidal and periportal fibrosis, ductal reaction with focal pericholangitis from vanishing bile duct in the setting of pomalidomide.   # Hepatic Encephalopathy  Diagnostic paracentesis negative for SBP. Malnutrition may also be contributing to ascites (see below).    Dx:  - 6/17 KUB: mild ileus and density over abdomen (likely ascitic fluid) - not clinically significant as patient is having regular bowel movements and  no PVR noted.  - 6/20 therapeutic paracentesis by TAPS, -4L + albumin    Plan:  -Continue spironolactone ('100mg'$ ) / lasix ('40mg'$ ) to minimize accumulation of ascites   (started 6/16, increased 6/19- )  Spironolactone w/ hold parameters for hypotension  -Continue Ursodiol 600 mg PO BID  -Continue rifaximin 550 mg PO QD  -Continue midodrine 10 mg PO TID  -Low sodium diet  -2L fluid restriction     # Failure to thrive  # PEG tube dependence  # Macrocytic anemia - B12 deficiency  PEG tube placed during last admission. Low B12 level but MMA normal, which is not consistent with B12 deficiency, however still treating at this time as below. Encouraged PO intake in addition to tube feeds, pt amenable on 6/22.   -Continue TF - Nutren 1.5 - goal 77m/hr, at goal  -Continue cyanocobalamin 10034m SQ daily x7 days (6/17-6/23) , then weekly (6/24- )  -Pending copper level    # Dysphagia  Dysphagia and aspiration risk assessed by speech language pathology. SLP recommended Modified Barium Swallow (completed on 6/21) which showed moderate pharyngeal aspiration; SLP recommends regular diet w/ mildly thickened liquids.   6/22 - Pt and daughter request to thicken liquids themselves at bedside for more variety, understand risk of aspiration w/ thins. Liberalized diet per  their request to regular.   Appreciate SLP recs.   - See failure to thrive section as well re: PEG tube and nutritional deficiencies     # Pain  # Abdominal cramping   # Mental anguish  Discussed on 6/21 and pain r/t pressure injuries as well as abdominal pain limit pt's participation in PT/OT. Recommended pain management prior to/after PT/OT sessions to improve tolerance.   -Consulted Palliative Care 6/17 for SMS  -Continue hydrocodone-acetaminophen PRN  -Continue hydromorphone PRN  -Increased mirtazapine to '30mg'$  nightly (6/18) per palliative recs     # Physical Deconditioning  Pt motivated to participate w/ PT/OT, deconditioned after prior extended hospitalization (as above)  - Continue PT/OT   - recommend placement SNF    #GOC  Pt is consistent and clear that she wishes to pursue full medical management of her ongoing medical problems, as above, with the goal of prolonging her life. She is motivated by her desire to spend time with her family. This is consistent in conversations with [facility] (6/18) & Dr. WoJacelyn Grip6/21).     VTE PPx:  Contraindicated: Anticipated procedure    Severity of Illness  Stable    Nutritional Assessment  Severe Chronic disease or condition related malnutrition    During this hospitalization the patient is also being treated for:  - Anemia associated with malignancy (present on admission)    - Thrombocytopenia (present on admission)    - Coagulopathy (present on admission)    - Malignancy associated fatigue on admission  - Neoplasm/malignancy associated pain  - Hypoalbuminemia (present on admission)  - HYPOnatremia (present on admission)  - Fluid status: Volume overload on admission  - Cachexia  - Acute liver failure, present on admission    Code Status: FULL    I spent a total time of 70 minutes in this episode of care preparing to see the patient, obtaining and/or reviewing separately obtained history, performing a medically appropriate examination and/or evaluation, counseling and  educating patient/family/caregiver, ordering and reviewing medications, tests, or procedures, referring and communicating with other health care professionals, documenting clinical information in the electronic or other health record, and communicating results to the patient/family/caregiver, and/or other care  coordination.    [name], NP  05/06/22

## 2022-05-06 NOTE — Plan of Care (Signed)
Problem: Discharge Planning - Adult  Goal: Knowledge of and participation in plan of care  Outcome: Progress within 12 hours     Problem: Fall, at Risk or Actual - Adult  Goal: Absence of falls and fall related injury  Outcome: Progress within 12 hours     Problem: Delirium - Adult / Pediatric  Goal: Absence or resolution of delirium  Outcome: Progress within 12 hours     Problem: Nutrition, Alteration in -Adult  Goal: Adequate nutritional intake  Outcome: Progress within 12 hours  Goal: Maximize nutritional intake per patient condition  Outcome: Progress within 12 hours     Problem: Pressure Injury,At Risk and Actual- Adult  Goal: Absence of any new pressure injury  Outcome: Progress within 12 hours  Goal: Pressure injury healing/stabilization  Outcome: Progress within 12 hours  Goal: Participation in preventative efforts and treatment plan (Patient/Family/Caregiver)  Outcome: Progress within 12 hours  Goal: Adequate nutritional intake  Outcome: Progress within 12 hours     Problem: GI Elimination Impaired - Adult  Goal: Passage of soft, formed stool  Outcome: Progress within 12 hours  Goal: Stool elimination per clinical condition ( e.g. GI ostomies )  Outcome: Progress within 12 hours

## 2022-05-07 LAB — COMPLETE BLOOD COUNT WITH DIFFERENTIAL
Abs Basophils: 0.02 10*9/L (ref 0.00–0.10)
Abs Eosinophils: 0.03 10*9/L (ref 0.00–0.40)
Abs Imm Granulocytes: 0.09 10*9/L (ref <0.10)
Abs Lymphocytes: 0.95 10*9/L — ABNORMAL LOW (ref 1.00–3.40)
Abs Monocytes: 0.63 10*9/L (ref 0.20–0.80)
Hematocrit: 27.2 % — ABNORMAL LOW (ref 36.0–46.0)
Hemoglobin: 8.2 g/dL — ABNORMAL LOW (ref 12.0–15.5)
MCH: 33.5 pg (ref 26.0–34.0)
MCHC: 30.1 g/dL — ABNORMAL LOW (ref 31.0–36.0)
MCV: 111 fL — ABNORMAL HIGH (ref 80–100)
MPV: 12.9 fL — ABNORMAL HIGH (ref 9.1–12.6)
RBC Count: 2.45 10*12/L — ABNORMAL LOW (ref 4.00–5.20)
RDW-CV: 17.3 % — ABNORMAL HIGH (ref 11.7–14.4)
WBC Count: 5.8 10*9/L (ref 3.4–10.0)

## 2022-05-07 LAB — BASIC METABOLIC PANEL (NA, K, CL, CO2, BUN, CR, GLU, CA)
Anion Gap: 6 (ref 4–14)
Calcium, total, Serum / Plasma: 8.2 mg/dL — ABNORMAL LOW (ref 8.4–10.5)
Carbon Dioxide, Total: 30 mmol/L — ABNORMAL HIGH (ref 22–29)
Chloride, Serum / Plasma: 104 mmol/L (ref 101–110)
Creatinine: 0.63 mg/dL (ref 0.55–1.02)
Glucose, non-fasting: 82 mg/dL (ref 70–199)
Sodium, Serum / Plasma: 140 mmol/L (ref 135–145)
eGFRcr: 100 mL/min/{1.73_m2} (ref >59)

## 2022-05-07 LAB — ALANINE TRANSAMINASE: Alanine transaminase: 21 U/L (ref 10–61)

## 2022-05-07 LAB — PHOSPHORUS, SERUM / PLASMA: Phosphorus, Serum / Plasma: 3.6 mg/dL (ref 2.3–4.7)

## 2022-05-07 LAB — ALKALINE PHOSPHATASE: Alkaline Phosphatase: 202 U/L — ABNORMAL HIGH (ref 38–108)

## 2022-05-07 LAB — MAGNESIUM, SERUM / PLASMA: Magnesium, Serum / Plasma: 1.8 mg/dL (ref 1.6–2.6)

## 2022-05-07 LAB — BILIRUBIN, TOTAL: Bilirubin, Total: 0.8 mg/dL (ref 0.2–1.2)

## 2022-05-07 LAB — ASPARTATE TRANSAMINASE: AST: 50 U/L — ABNORMAL HIGH (ref 5–44)

## 2022-05-07 MED ORDER — LORAZEPAM 0.5 MG TABLET
0.5 | ORAL | Status: DC | PRN
Start: 2022-05-07 — End: 2022-05-07

## 2022-05-07 MED ORDER — LOPERAMIDE 2 MG TABLET
2 | Freq: Three times a day (TID) | ORAL | Status: DC
Start: 2022-05-07 — End: 2022-05-11
  Administered 2022-05-08 – 2022-05-11 (×11): 4 mg via GASTROSTOMY

## 2022-05-07 MED ORDER — ONDANSETRON HCL (PF) 4 MG/2 ML INJECTION SOLUTION
4 | Freq: Three times a day (TID) | INTRAMUSCULAR | Status: DC | PRN
Start: 2022-05-07 — End: 2022-05-09
  Administered 2022-05-08: 07:00:00 via INTRAVENOUS

## 2022-05-07 MED ORDER — LORAZEPAM 2 MG/ML INJECTION SOLUTION
2 | INTRAMUSCULAR | Status: DC | PRN
Start: 2022-05-07 — End: 2022-05-07

## 2022-05-07 MED FILL — MIRTAZAPINE 30 MG TABLET: 30 mg | ORAL | Qty: 1

## 2022-05-07 MED FILL — ZINC ACETATE 33 MG/ML ORAL SOLN (PEDI BCH-SF): 33 mg/mL (10 mg of elemental zinc/mL) | ORAL | Qty: 2.5

## 2022-05-07 MED FILL — MAG-AL PLUS 200 MG-200 MG-20 MG/5 ML ORAL SUSPENSION: 200-200-20 mg/5 mL | ORAL | Qty: 30

## 2022-05-07 MED FILL — URSODIOL 60 MG/ML ORAL SYRUP: 60 mg/mL | ORAL | Qty: 10

## 2022-05-07 MED FILL — XIFAXAN 550 MG TABLET: 550 mg | ORAL | Qty: 1

## 2022-05-07 MED FILL — ANTI-DIARRHEAL (LOPERAMIDE) 2 MG TABLET: 2 mg | ORAL | Qty: 2

## 2022-05-07 MED FILL — HYDROXYZINE HCL 25 MG TABLET: 25 mg | ORAL | Qty: 1

## 2022-05-07 MED FILL — ONDANSETRON HCL 4 MG TABLET: 4 mg | ORAL | Qty: 2

## 2022-05-07 MED FILL — HYDROCODONE 5 MG-ACETAMINOPHEN 325 MG TABLET: 5-325 mg | ORAL | Qty: 2

## 2022-05-07 MED FILL — URSODIOL (BULK) 100 % POWDER: 100 % | Qty: 0.6

## 2022-05-07 MED FILL — DILAUDID (PF) 0.5 MG/0.5 ML INJECTION SYRINGE: 0.5 mg/ mL | INTRAMUSCULAR | Qty: 0.5

## 2022-05-07 MED FILL — ZINC ACETATE DIHYDRATE (BULK) 100 % CRYSTALS: 100 % | Qty: 0.08

## 2022-05-07 MED FILL — LORAZEPAM 2 MG/ML INJECTION SOLUTION: 2 mg/mL | INTRAMUSCULAR | Qty: 1

## 2022-05-07 NOTE — Consults (Signed)
NUTRITION SERVICES ASSESSMENT    Patient History:   22F MM, chronic diarrhea (CMV colitis vs norovirus infection), acute liver injury, admitted for workup of persistent diarrhea and FTT       In-House Nutrition Orders:  Regular Diet     Tube Feeding-Adult (Continuous): Nutren 1.5   Order Comments:Start full strength @ 10 mL/hr  Advance 20 mL/hr every 8 hours as tolerated to goal rate.  Check residuals every 8 hours and hold if equal to or greater than 500 mL.    If PEG-J tube present, please use the J-port to feed the patient.    Anthropometric Data and Assessment:  Height: 153 cm  Ideal Body Weight: 45.99 kg    Usual body weight:90.91 kg (per previous admissions)   Admit weight: 66.2 kg (146 lb) (04/25/22 1427) (Bed scale)  Current weight: 73.7 kg (162 lb 7.7 oz) (05/06/22 1800) (Unknown)    Calculation Weight: 69 kg (Percent IBW (calculated) (%): 150)  Calc weight BMI: 29.5 kg/m^2  Alternative Weight (kg): 53.4 kg (MAW)    Wt Readings from Last 10 Encounters:   05/06/22 73.7 kg (162 lb 7.7 oz)   01/15/22 96 kg (211 lb 10.3 oz)   12/12/18 96.3 kg (212 lb 4.9 oz)   08/08/18 98.9 kg (218 lb 1.6 oz)   05/23/18 98.7 kg (217 lb 9.6 oz)   04/11/18 98.5 kg (217 lb 1.6 oz)   01/10/18 98.4 kg (217 lb)   09/16/17 96.6 kg (213 lb)   06/21/17 96.4 kg (212 lb 9.6 oz)   03/15/17 95.6 kg (210 lb 12.8 oz)       Total weight change: -27 kg (-28.13 %)  Time frame for weight change: 3 months    Assessment of weight change: Severe weight loss  Per chart, pt's UBW seems to be between 210-220 lbs. Pt has had a recent severe weight loss of 28% in 3 months.    Nutrition-focused physical findings:        Physical Findings (Exam Date 6/16)  Extremities: Lower extremity edema  Muscles  Temples: Severe Depletion: Hollowing, scooping, depression  Clavicle: Severe Depletion: Protruding, prominent bone  Shoulders: Severe Depletion: Shoulder to arm joint looks square. Bones prominent. Acromion protrusion very prominent.  Interosseous Muscles:  Mild/Moderate Depletion: Slightly depressed  Patellae:  (UTA given edema)  Quadriceps:  (UTA given edema)  Calves:  (UTA given edema)  Adipose  Orbital: Severe Depletion: Hollow look, depressions, dark circles, loose skin  Triceps: Well-nourished: Ample fat tissue obvious between folds of skin    Edema (per RN assessment):   Generalized Edema: Mild pitting, slight indentation (05/07/22 0850)  Sacral: Mild pitting, slight indentation (05/06/22 2300)  RLE Edema: Mild pitting, slight indentation (05/07/22 0850)  LLE Edema: Mild pitting, slight indentation (05/06/22 2300)    Cognition (per RN assessment):  Level of Consciousness: Awake, Alert  Orientation Level: Oriented X4  Cognition: Short Term Memory Loss, Poor attention/concentration, Poor safety awareness, Poor judgment, Impulsive  Speech: Clear    GI Findings and Assessment: Daughter reports stomach discomfort the last 2-3 days, somewhat relieved by Maalox. Diarrhea improving. 6/22 BM x2, emesis today      GI: Emesis   Per RN assessment:   Gastrointestinal  *Gastrointestinal (WDL): Exceptions to WDL  Mucous Membrane(s): Dry  Abdomen Inspection: Distended  Bowel Sounds (All Quadrants): Present  Tenderness: Tenderness  Passing Flatus: Yes  GI Symptoms: Loss of appetite    Lines, Drains, Airway, Wounds:   Patient Lines/Drains/Airways Status  Active LDAs       Name Placement date Placement time Site Days    Peripheral IV 05/07/22 Anterior;Distal;Left;Lower Arm 05/07/22  0516  Arm  less than 1    Wound 12/19/21 Skin tear Sacrum 12/19/21  1900  Sacrum  138    Wound 04/14/22 Back Right 04/14/22  --  Back  23    Wound 04/16/22 Abdomen Right;Left 04/16/22  --  Abdomen  21    Feeding Tube Left lower quadrant --  --  LLQ  --                    Food and Nutrition Intake:   Information obtained from: Patient, Family, Chart  Percentage of meals eaten for the past 168 hrs:   Percent Meals Eaten (%)   05/07/22 0000 0 %   05/06/22 2300 0 %   05/06/22 0800 0 %   05/06/22 0000  0 %   05/05/22 2300 0 %   05/05/22 1225 100 %   05/05/22 0000 0 %   05/04/22 2300 0 %   05/04/22 0900 0 %   05/03/22 2000 0 %   05/03/22 1648 0 %   05/03/22 1018 0 %   05/02/22 2000 0 %   05/02/22 1300 0 %   05/01/22 1706 0 %   05/01/22 1300 0 %   05/01/22 1000 0 %   04/30/22 1747 0 %       Oral Food and Fluid Intake  Meal Intake Percentage: <50% of meals  Poor Meal Intake Assessment Period: greater than or equal to 7 days    Diet Recall: Other (comment)  Diet Recall Details: Ordered for a regular diet on 6/22. Minimal to no food intake over the last 7 days. Only takign sips of juice and Gatorade.       Intake Data (Date(s): 6/16-22)  Average Daily Intake   Calories (total) Protein (total)   Oral Nutrition       Enteral Nutrition 985 kcal (60% est kcal) 45 g (65% est protein)   Parenteral Nutrition       Total intake per day 985 kcal 45 g        Assessment of energy/protein intake: Suboptimal protein/energy intake  Intake met what percentage of estimated needs?: 51 - 75% of estimated needs  Duration of sub-optimal intake: greater than or equal to 7 days     Barriers to adequate nutrient intake: Altered GI function, Dysphagia, Poor appetite, Frequent holds to nutrition support regimen while hospitalized, Slow advancement of nutrition support       Significant Labs:   Biochemical:  Lab Results   Component Value Date    Sodium, Serum / Plasma 140 05/07/2022    Potassium, Serum / Plasma 4.1 05/07/2022    Chloride, Serum / Plasma 104 05/07/2022    Carbon Dioxide, Total 30 (H) 05/07/2022    Urea Nitrogen, Serum / Plasma 27 (H) 05/07/2022    Creatinine 0.63 05/07/2022    eGFRcr 100 05/07/2022    Glucose 101 (H) 05/10/2019    Glucose, non-fasting 82 05/07/2022    Calcium, total, Serum / Plasma 8.2 (L) 05/07/2022    Calcium, Ionized, whole blood 1.17 04/23/2022    Magnesium, Serum / Plasma 1.8 05/07/2022    Phosphorus, Serum / Plasma 3.6 05/07/2022     Lab Results   Component Value Date    Alanine transaminase 21 05/07/2022     AST 50 (H) 05/07/2022    Alkaline  Phosphatase 202 (H) 05/07/2022    Bilirubin, Direct 6.2 (H) 01/15/2022    Bilirubin, Total 0.8 05/07/2022    Gamma-Glutamyl Transpeptidase 93 (H) 05/06/2022    Triglycerides, serum 259 (H) 12/20/2021    PT 16.7 (H) 04/23/2022    Ammonia 32 04/29/2022     Lab Results   Component Value Date    Lactate, whole blood 1.0 04/23/2022     Lab Results   Component Value Date    Hemoglobin 8.2 (L) 05/07/2022    MCV 111 (H) 05/07/2022    Abs Neutrophils 4.12 05/07/2022     Lab Results   Component Value Date    Glucose, Glucometer 89 12/30/2021    Glucose, Glucometer 99 12/29/2021    Glucose, Glucometer 105 12/29/2021    Glucose, Glucometer 84 12/28/2021    Glucose, Glucometer 88 12/28/2021    Glucose, Glucometer 93 12/27/2021    Glucose, Glucometer 81 12/27/2021    Glucose, Glucometer 102 12/26/2021    Glucose, Glucometer 86 12/26/2021    Glucose, Glucometer 88 12/25/2021     Micronutrient Profile:  Lab Results   Component Value Date    Vitamin D, 25-Hydroxy 11 (L) 04/23/2022    Ferritin, Serum/Plasma 948 (H) 12/20/2021    Iron, serum 60 12/11/2021    Transferrin, Serum/Plasma 46 (L) 12/11/2021    % Saturation 93 (H) 12/11/2021    Vitamin B12 242 (L) 04/28/2022    MMA, Serum 0.32 04/28/2022    Folate, serum 12.5 04/28/2022     Inflammatory profile:   Lab Results   Component Value Date    C-Reactive Protein 11.3 (H) 12/13/2021    Albumin, Serum / Plasma 1.9 (L) 04/23/2022    WBC Count 5.8 05/07/2022     Stool studies:  Lab Results   Component Value Date    Clostridium difficile Toxin gene: Not detected 04/23/2022    Clostridium difficile Comment: Negative test 04/23/2022       Significant Meds:   Scheduled Meds:   0.9% sodium chloride flush  3 mL Intravenous Q12H [place]    cholecalciferol (vitamin D3)  2,000 Units Per G Tube Daily [facility]    [START ON 05/08/2022] cyanocobalamin (Vitamin B12)  1,000 mcg Subcutaneous Q7 Days    ergocalciferol (vitamin D2)  50,000 Units Per G Tube Q7 Days    folic acid   1 mg Per G Tube Daily [facility]    furosemide  40 mg Per G Tube Daily [place]    lansoprazole  30 mg Per G Tube Q AM Before Breakfast [facility]    letermovir  480 mg Other Daily Center    loperamide  4 mg Feeding Tube Q6H [facility]    mirtazapine  30 mg Per G Tube Daily At Bedtime [facility]    multivitamin  1 tablet Per G Tube Daily [place]    ondansetron  8 mg Per G Tube TID    rifAXIMin  550 mg Feeding Tube BID [facility]    spironolactone  100 mg Per G Tube Daily [place]    thiamine mononitrate (vit B1)  100 mg Per G Tube Daily [place]    ursodiol  600 mg Per G Tube BID [facility]    zinc acetate  25 mg of elemental zinc (Zn) Per G Tube BID [facility]     Continuous Infusions:  PRN Meds:.0.9% sodium chloride flush, acetaminophen, albuterol **OR** albuterol, aluminum-magnesium hydroxide-simethicone, HYDROcodone-acetaminophen, HYDROmorphone, HYDROmorphone, hydrOXYzine, LORazepam **OR** LORazepam, opium, prochlorperazine **OR** prochlorperazine    Comparative Standards:   Energy Needs:  MSJ: 4270- 6237 (based on 1175.25: x 1.4 - 1.5)  Protein Needs: 69.42 g - 80.1 g (1.3 - 1.5 g/kg based on 53.4 kg (MAW))  Fluid needs: 30 mL/kg: 1602 mL/d     Nutrition diagnosis:   Inadequate oral intake     related to poor appetite, AMS as evidenced by reliance on EN supplementation to meet nutrient needs, 0% PO on I/Os. Active.    Inadequate energy intake     related to insufficient enteral nutrition infusion as evidenced by patient receivign <75% estimated calorie and protein needs from TFs. New.                  Malnutrition Summary:  Severe Chronic disease or condition related malnutrition related to AMS, poor PO tolerance as evidenced by weight loss of >7.5% in 3 months, severe loss of muscle mass, severe loss of body fat .  Present on admission.         Nutrition Intervention:  Oral Nutrition:    Inpatient Orders: Current diet order appropriate         Nutrition recommendations: Energy Modification, Protein Modification  Energy Modification: Increased energy intake  Protein Modification:  Increased protein intake          Oral Nutrition Supplements and/or Food Fortification  Supplemental nourishment plan: Continue oral nutrition supplements  Name and nutrient provision: Dillard [place]  Frequency recommendation: 1x daily     Enteral Nutrition:   Enteral nutrition plan: Modify rate of enteral nutrition, Modify schedule of enteral nutrition  Cyclic Tube Feedings  Discussed with daughter and patient options for optimizing EN regimen:  1) Option 1 - cyclic nocturnal TF to encourage PO intake during the day  2)  Option 2 - gravity feeds during the day to simulate meal time  Based on patient complaint of stomach discomfort, nausea, emesis, and feelings of being overwhelmed with uncomfortable procedures this week, decided to pursue less aggressive regimen of nocturnal TF. Patient with no desire to eat food, rec'd continue cycle Tf at 100% needs.     --> Cycle Nutren 1.5 @ 75 mL/hr for 16 hrs from 18:00 to 10:00.  Provides 1200 mL, 1800 kcal (100%), 82 gm protein (100%), 13 gm CHO/hr during cycle, 100% RDI for vitamins/minerals.    Tube Feed Monitoring:  TF M&E: Weigh patient weekly  Monitor serum electrolytes, Cr, BUN, Mg, Phos, Ca/iCa daily (until stable)  Monitor oral intake and reduce TF volume to encourage PO.  Hydration:        Vitamins/Minerals  Discontinue micronutrient supplementation       Specific mineral supplements therapy: Zinc   Patient s/p 12 days of zinc supplementation, diarrhea improved.        Nutrition Monitoring/Evaluation:  Nutrition Goal Indicator: Energy intake  Nutrition Goal Criteria: TF to meet >75% estimated energy needs  Nutrition Goal Progress: Some progress toward goal  Nutrition Goal 2 Indicator: Macronutrient intake  Nutrition Goal 2 Criteria: TF to meet >75% estimated protein needs  Nutrition Goal 2 Progress: Some progress toward goal  Nutrition Goal 3 Indicator: Food intake  Nutrition Goal 3 Criteria: Patient consuming >50% of 1 meal + 1 ONS per day  Nutrition Goal 3 Progress:  New goal identified    Christel Mormon, RD, CNSC  [place] [phone]

## 2022-05-07 NOTE — Plan of Care (Signed)
Problem: Discharge Planning - Adult  Goal: Knowledge of and participation in plan of care  Outcome: Progress within 12 hours     Problem: Fall, at Risk or Actual - Adult  Goal: Absence of falls and fall related injury  Outcome: Progress within 12 hours     Problem: Delirium - Adult / Pediatric  Goal: Absence or resolution of delirium  Outcome: Progress within 12 hours     Problem: Nutrition, Alteration in -Adult  Goal: Adequate nutritional intake  Outcome: Progress within 12 hours  Goal: Maximize nutritional intake per patient condition  Outcome: Progress within 12 hours     Problem: Pressure Injury,At Risk and Actual- Adult  Goal: Absence of any new pressure injury  Outcome: Progress within 12 hours  Goal: Pressure injury healing/stabilization  Outcome: Progress within 12 hours  Goal: Participation in preventative efforts and treatment plan (Patient/Family/Caregiver)  Outcome: Progress within 12 hours  Goal: Adequate nutritional intake  Outcome: Progress within 12 hours     Problem: GI Elimination Impaired - Adult  Goal: Passage of soft, formed stool  Outcome: Progress within 12 hours  Goal: Stool elimination per clinical condition ( e.g. GI ostomies )  Outcome: Progress within 12 hours

## 2022-05-07 NOTE — Progress Notes (Signed)
[facility] PROGRESS NOTE     This is an independent service.   The available consultant for this service is [facility] Carolynn Serve, Ariel Braun.           24 Hour Course  - No acute events overnight, HDS, afebrile  - New report of visual hallucination, delirium precautions ordered, checking ammonia w/ 6/24 labs  - Switched to cyclic tube feeds, appreciate ongoing RD recs    Subjective  Had some nausea today after morning meds, emesis x1. Does also report some upper abdominal discomfort. Not straining to have BMs, still soft and formed. Denies visual hallucinations, but does feel confused.     Vitals  Temp:  [36.9 C (98.4 F)-37.3 C (99.2 F)] 37.3 C (99.2 F)  Heart Rate:  [82-89] 82  *Resp:  [15-18] 18  BP: (90-110)/(60-69) 110/69  SpO2:  [98 %-100 %] 98 %    MostRecent Weight: 73.7 kg (162 lb 7.7 oz)  Admission Weight: 66.2 kg (146 lb)      Intake/Output Summary (Last 24 hours) at 05/07/2022 1647  Last data filed at 05/07/2022 1104  Gross per 24 hour   Intake 710 ml   Output 650 ml   Net 60 ml       Physical Exam  Gen: NAD, alert and oriented to person and year, not place, answers questions slowly but appropriate and engaged  HEENT: MMM, no oral lesions, sclera non-icteric  Resp: normal work of breathing  Card: RRR  Gi: Mildy distended, soft, non-tender  Ext: negative BLE edema   Skin: scattered ecchymosis to BUE  Lines: PIV x1 in place, clean dry & intact dressing    Scheduled Meds:   0.9% sodium chloride flush  3 mL Intravenous Q12H [place]    cholecalciferol (vitamin D3)  2,000 Units Per G Tube Daily [facility]    [START ON 05/08/2022] cyanocobalamin (Vitamin B12)  1,000 mcg Subcutaneous Q7 Days    ergocalciferol (vitamin D2)  50,000 Units Per G Tube Q7 Days    folic acid  1 mg Per G Tube Daily [place]    furosemide  40 mg Per G Tube Daily [place]    lansoprazole  30 mg Per G Tube Q AM Before Breakfast [facility]    letermovir  480 mg Other Daily [place]    loperamide  4 mg Feeding Tube Q6H [place]    mirtazapine  30 mg Per G Tube Daily At  Bedtime [facility]    multivitamin  1 tablet Per G Tube Daily [place]    ondansetron  8 mg Per G Tube TID    rifAXIMin  550 mg Feeding Tube BID [facility]    spironolactone  100 mg Per G Tube Daily [place]    thiamine mononitrate (vit B1)  100 mg Per G Tube Daily [place]    ursodiol  600 mg Per G Tube BID [place]     Continuous Infusions:  PRN Meds:   0.9% sodium chloride flush  3 mL Intravenous PRN    acetaminophen  650 mg Per G Tube Q8H PRN    albuterol  2.5 mg Nebulization Q4H PRN    Or    albuterol  2.5 mg Nebulization Q4H PRN    aluminum-magnesium hydroxide-simethicone  15 mL Per G Tube Q4H PRN    HYDROcodone-acetaminophen  1-2 tablet Per G Tube Q6H PRN    HYDROmorphone  0.2-0.4 mg Intravenous Q4H PRN    HYDROmorphone  0.4 mg Intravenous Once PRN    hydrOXYzine  25  mg Feeding Tube Q4H PRN    opium  6 mg Oral 4x Daily PRN    prochlorperazine  5 mg Intravenous Q8H PRN    Or    prochlorperazine  5 mg Per G Tube Q8H PRN       Data    CBC        05/07/22  0609 05/06/22  0542   WBC 5.8 5.6   HGB 8.2* 7.7*   HCT 27.2* 25.1*   PLT 107* 102*       Coags  No results found in last 36 hours  Chem7        05/07/22  0609 05/06/22  0542   NA 140 140   K 4.1 4.1   CL 104 106   CO2 30* 29   BUN 27* 27*   CREAT 0.63 0.62   GLU 82 113       Electrolytes        05/07/22  0609 05/06/22  0542   CA 8.2* 7.8*   MG 1.8 1.7   PO4 3.6 3.2       Liver Panel        05/07/22  0609 05/06/22  0542   AST 50* 53*   ALT 21 22   ALKP 202* 220*   TBILI 0.8 0.7       UA  No results found in last 7 days  Microscopy:  No results found in last 7 days    Microbiology Results (last 72 hours)       Procedure Component Value Units Date/Time    Bacterial Culture, Normally Sterile Sites, With Gram Stain [[id number]] Collected: 04/23/22 0028    Order Status: Completed Specimen: Not Applicable from Peritoneal Fluid Updated: 05/07/22 0936     Gram Stain No PMNs seen , Rare Host cells (not PMNs, RBCs, or squamous epithelial cells) , Few RBCs      No organisms seen     Bacterial Culture,  Sterile Sites No growth 14 days.    Cytomegalovirus DNA, Quantitative PCR, Plasma [[id number]]  (Abnormal) Collected: 05/04/22 0538    Order Status: Completed Specimen: Blood from Serum Updated: 05/05/22 0912     CMV DNA (IU/mL) 70 IU/mL      Comment: *SUBCRITICAL*  Performed by Real-Time PCR  Note new reference range.          CMV DNA (Log IU/mL) 1.84     Comment: Log10 IU/mL  Note new reference range.                 Radiology Results  No results found.    I discussed the patient with Tilden Fossa, Ariel Braun from [facility] regarding A&P.    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  2011: VRd bortezomib, Lenalidomide, Dexamethasone  04/2011: Auto SCT  07/2014: Rd - revlimid 15 mg every other day, dexamethasone 8 mg PO weekly.   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   Receiving dexamethasone 12 mg weekly for diarrhea as outpt  10/16/21 - last Daratumumab dose (on hold due to diarrhea)     DATA:   09/07/2010: IgG  Kappa MM  12/02/2021: Ig M <5, IgG 447, IgA 20, Ig E <2, SFLC Kappa 16.6, Lambda 1.6, Kappa/Lamda 10.38  12/14/21: IgG K on IFE, IgG 430, KLC 16.6, LLC 2.7, KLR 6.15  02/23/22 SPEP with stable M-spike, SFLC decreased Kappa 44.2 / Lambda 5.5 = K/L ratio = 8.04 (overall downtrending still)  04/23/22 M-spike 0.6, IgG K on IFE, IgG 997, Kappa 70.7/Lambda 19.8 = K/L ratio = 3.57     Chemo:  TBD, therapy held since 10/2021 due to recurrent infections and now deconditioning  Not currently a candidate due to significant deconditioning, may be candidate in the future if improved.     Supportive Care:   Transfuse for Hgb <7 and Platelets <10  Access: PIV; consider PICC placement to manage pain of blood  draws     Primary Oncologist:   Dr. Jacelyn Grip ([place]), will transfer to Dr. Hassell Done ([place])  Dr. Iona Coach ([place])     Disposition:  Lives in [place] - anticipate return to home with [facility] vs SNF, currently working with PT  - Patient centered rounds: 6/20, awaiting SNF placement     # Immunocompromised  # Diarrhea  # c/b CMV Viremia  # Hx prolonged norovirus infection  Immunocompromised due to underlying  malignancy, not currently neutropenic. Recent extended hospitalization at both [place] 1/23-04/16/22, with hypovolemic shock associated with norovirus, CMV colitis, acute liver failure associated with pomylast (see ALF section).  Currently admitted for ongoing diarrhea, initially began in 8/22, recently managed at home w/ loperamide BID w/ 2-3 watery stools daily. Diarrhea worsened on 6/12 (11 stools). Possibly r/t to inadequate dosing of loperamdie vs infectious etiology. GI c/s 6/13 for possible scope - per patient and family declined endoscopy at this time.  Given CMV improving, norovirus negative, and enteric panel unrevealing - favor non-infectious etiologies for diarrhea. ID has requested we maintain enteric precautions for hospitalization d/t prolonged prior GI infections.     Data:  Norovirus  First +norovirus 08/2021 with persistent +norovirus s/p 7-day courses x 2 nitazoxanide (2/16-02/22/23 & 01/2022). Continued to be norovirus positive (most recently on 04/07/22). 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 Her CMV (iu/mL) 443,961--> 179,555--> 74 (2/22) --> 176 (3/1) --> 105 (6/9) --> 70 (6/20)    6/9: C. Diff neg, GI bacterial/viral/parasite panels neg, norovirus neg     Plan  - Continue letermovir indefinitely per Dr Jacelyn Grip on 6/21 (6/16- )  - Trend weekly CMV PCR, next draw 6/27  - Patient and family defer endoscopy (offered  by GI previously)  - Revisit if medical mgmt ineffective; diarrhea currently resolved  - Decreased loperamide q8h ATC 6/23  - Continue tincture of opium QID PRN  - Defer ID consultation in absence of new ID findings on enteric workup     # Hx Acute liver failure, now requiring paracentesis  # Pericentral/sinusioidal and periportal fibrosis, ductal reaction with focal pericholangitis from vanishing bile duct in the setting of pomalidomide.   # Hepatic Encephalopathy  Diagnostic paracentesis negative for SBP. Malnutrition may also be contributing to ascites (see below). New report of hallucination on 6/23.    Dx:  - 6/17 KUB: mild ileus and density over abdomen (likely ascitic fluid) - not clinically significant as patient is having regular bowel movements and no PVR noted.  - 6/20  therapeutic paracentesis by TAPS, -4L + albumin  - 6/23 ammonia pending '[ ]'$     Plan:  -Continue spironolactone ('100mg'$ ) / lasix ('40mg'$ ) to minimize accumulation of ascites   (started 6/16, increased 6/19- )  Spironolactone w/ hold parameters for hypotension  -Continue Ursodiol 600 mg PO BID  -Continue rifaximin 550 mg PO QD  -Continue midodrine 10 mg PO TID  -Low sodium diet  -2L fluid restriction  -Delirium precautions     # Failure to thrive  # PEG tube dependence  # Macrocytic anemia - B12 deficiency  PEG tube placed during last admission. Low B12 level but MMA normal, which is not consistent with B12 deficiency, however still treating at this time as below. Encouraged PO intake in addition to tube feeds, pt amenable on 6/22.   -Continue TF - Nutren 1.5 59m/hr   67/78 RD rec'd cyclic tube feeds w/ Nutren 1.5 at 75 mL/hr for 16 hours from 18:00 to 10:00.    Check residuals every 8 hours during feeding time and hold if equal to or greater than 500 mL.  Pt declined transition to cyclic feeds on 62/42 consider in future  -Continue cyanocobalamin 10027m SQ weekly (6/24- )   -s/p Cyanocobalamin 100040mSQ daily x7 days (6/17-6/23)  -s/p Zinc  acetate 6/11-6/23)  -Pending copper level    # Dysphagia  Dysphagia and aspiration risk assessed by speech language pathology. SLP recommended Modified Barium Swallow (completed on 6/21) which showed moderate pharyngeal aspiration; SLP recommends regular diet w/ mildly thickened liquids.   6/22 - Pt and daughter request to thicken liquids themselves at bedside for more variety, understand risk of aspiration w/ thins. Liberalized diet per their request to regular.   Appreciate SLP recs.   - See failure to thrive section as well re: PEG tube and nutritional deficiencies     # Pain  # Abdominal cramping   # Mental anguish  Discussed on 6/21 and pain r/t pressure injuries as well as abdominal pain limit pt's participation in PT/OT. Recommended pain management prior to/after PT/OT sessions to improve tolerance.   -Consulted Palliative Care 6/17 for SMS, now s/o  -Continue hydrocodone-acetaminophen PRN  -Continue hydromorphone PRN  -Increased mirtazapine to '30mg'$  nightly (6/18) per palliative recs     # Physical Deconditioning  Pt motivated to participate w/ PT/OT, deconditioned after prior extended hospitalization (as above)  - Continue PT/OT   - recommend placement SNF    #GOC  Pt is consistent and clear that she wishes to pursue full medical management of her ongoing medical problems, as above, with the goal of prolonging her life. She is motivated by her desire to spend time with her family. This is consistent in conversations with [facility] (6/18) & Dr. WonJacelyn Grip/21).     VTE PPx:  Contraindicated: Anticipated procedure    Severity of Illness  Stable    Nutritional Assessment  Severe Chronic disease or condition related malnutrition    During this hospitalization the patient is also being treated for:  - Anemia associated with malignancy (present on admission)    - Thrombocytopenia (present on admission)    - Coagulopathy (present on admission)    - Malignancy associated fatigue on admission  - Neoplasm/malignancy  associated pain  - Hypoalbuminemia (present on admission)  - HYPOnatremia (present on admission)  - Fluid status: Volume overload on admission  - Cachexia  - Acute liver failure, present on admission    Code Status: FULL    I spent a total  time of 70 minutes in this episode of care preparing to see the patient, obtaining and/or reviewing separately obtained history, performing a medically appropriate examination and/or evaluation, counseling and educating patient/family/caregiver, ordering and reviewing medications, tests, or procedures, referring and communicating with other health care professionals, documenting clinical information in the electronic or other health record, and communicating results to the patient/family/caregiver, and/or other care coordination.    Ariel Litter, NP  05/07/22

## 2022-05-07 NOTE — Consults (Signed)
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 Pt limited by nausea with emesis upon arrival however received anti nausea meds then agreeable to transfer to chair. Pt very tearful and scared today, disoriented and p/w delirium. Pt required increased cueing to engage in ADLs and maintain arousal. Pt following commands and able to particpate however very fatigued from flipping day/night sleep schedule and ongoing confusion. Pt tolerated transfer to chair well. Recommend placement.  Progress with OT: Good progress  Current maximal level of assist: Maximal assist  Next session focus:      Recommendations    Recommended Discharge Disposition Placement for continued therapy   Discharge DME Recommendations Defer to next level of care   Equipment Recommendations     DME Comment     Discharge Recommendations Comment     Rehab Potential Patient participates well in therapy and is progressing towards goal   Anticipated Assistance Available at Discharge Family;Spouse/Significant other   Anticipated Type of Assistance Available at Discharge Assist as needed   Anticipated Time of Assistance Available at Discharge Unknown at this time   Barriers to Discharge Medical issues   Patient's current functional ability appropriate for D/C recommendations Yes     Inpatient Recommendations  OT Inpatient Recommendations Bed in chair position;Encourage participation in ADLs;Delirium precautions;Out of bed for meals and ADLs;Frequent turns   Recommendation Comments Hoyer lift to high back chair   Current Maximal Level of Assist Needed Maximal assist     Brace/Precautions   Brace/Orthotic/Prosthetic:   Precautions:Has weight bearing limitation or precaution: Yes  Precautions and  weight bearing status comments: Falls, delirium, skin, aspiration, contact     Subjective Report:"I am really scared, somone was in my sheets. I had to get out of bed and leave"    Patient/Family Goal:     Objective Findings and Interventions    Areas of Occupation    Grooming and Light Hygiene Moderate assistance   Cueing Required     Comment Assist with oral care to insure cleanliness. min A for UE support to comb hair. Able to wash face   Self Feeding     Cueing Required     Comment     Toileting     Cueing Required     Toileting Clothing Management     Cueing Required     Comment     Upper Body Dressing     Cueing Required     Comment     Lower Body Dressing Maximal assistance   Cueing Required     Comment don socks   Upper Body Bathing     Cueing Required     Comment     Lower Body Bathing     Cueing Required     Comment     ADL/IADL Comment         Functional Transfers    Bed Mobility From Supine   Bed Mobility To Sidelying   Level of Assist Minimal assistance   Cueing Required     Transfer From Bed   Transfer To Chair   Level of Assist Dependent   Cueing Required     Technique     Functional Transfer Comment Pt received supine crying. Pt  delirious, reported someone in her bed overnight. Provided increased orientation and listening. Pt became nauseous with emesis, mod A for anterior trunk shifting and cueing to flex neck. Pt received anti nausea meds, hoyer lift transfer to chair. Pt engaged in ADLs in chair with min A and lots of cues due to impaired attention. Pt left seated, posey donned, daughter arriving.     Functional Balance for ADLs  Position Static Dynamic   Sit Static sitting level of assist: Contact guard assist-   Static sitting comment: high back chair  -        Stand  -      -            [employer] [facility]	[place] AM-PAC for ADLs  6 Click Score: 14    Client Factors  Cognitive deficits noted in the following area(s):: Attention, Memory, Judgement/Safety, Orientation, Arousal  Cognition comment:RASS -1,  disoriented only oriented to self. Able to follow commands but fearful and reported someone in her bed last night and leaving her bed.    Problem solving;Safety awareness;Information processing;Problem ID;Initiation;Organization;Sequencing;Self-regulation;Working Chief of Staff functions comment:                     Roma Schanz, OT  05/07/2022 12:03 PM

## 2022-05-07 NOTE — Plan of Care (Addendum)
AOx4 but having visual hallucinations in AM. Thinks someone is crawling up in her bed. Patient reassured but looks fear full. Refused opening of windows. Knows its mornings.     Improved later in the day. Family at bedside. More engage able in morning when family is around.     Nausea in morning after meds. Emesis prns given. Another line of nausea meds ordered but not given due to possible confusion.     Tube feedings stopped per patient and family request. Provider notified.     In the afternoon, RN attempted to restart tube feeding. Patient observably overwhelmed. Doesn't want TF to be started. Provider notified.     Patient agreeable to tube feeding at 1800. Started.     Patient states when she talked to nutritionist, they wanted to put her on cycliced feedings. Patient did not want cyclic feedings.     Problem: Discharge Planning - Adult  Goal: Knowledge of and participation in plan of care  Outcome: Progress within 12 hours     Problem: Fall, at Risk or Actual - Adult  Goal: Absence of falls and fall related injury  Outcome: Progress within 12 hours     Problem: Delirium - Adult / Pediatric  Goal: Absence or resolution of delirium  Outcome: Progress within 12 hours     Problem: Nutrition, Alteration in -Adult  Goal: Adequate nutritional intake  Outcome: Progress within 12 hours  Goal: Maximize nutritional intake per patient condition  Outcome: Progress within 12 hours     Problem: GI Elimination Impaired - Adult  Goal: Passage of soft, formed stool  Outcome: Progress within 12 hours  Goal: Stool elimination per clinical condition ( e.g. GI ostomies )  Outcome: Progress within 12 hours

## 2022-05-07 NOTE — Interdisciplinary (Signed)
INPATIENT PALLIATIVE CARE SOCIAL WORK NOTE    Patient is a 61 y.o. 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     Data:     Patient Information  Referral Source: Medical Care Team  Referral Name: Heme/BMT  Presenting Concerns: Palliative Care consultation  Mode of Interview: In-person  Source of Information: Patient  Interpreter Needed?: No  Services/Teams: Palliative Care (Inpatient)    Please see PCS MD notes 05/02/22 from Ariel Braun for further information.    PCS team attempted to see pt multiple times over the week - Ariel Braun was either with other providers or asleep during our attempts. PCS SW and Resident, Ariel Braun, attempted to visit Ariel Braun today. She was sitting upright in a chair, with her daughter Ariel Braun and son Ariel Braun in the room. Upon introduction, Ariel Braun noted it was not a good time, did not have any needs at the moment, and she was waiting to be transferred back into her bed. Discussed care with primary team - diarrhea has resolved and no other needs at this time aside from caregiver burden assessment/support.    Interventions/Plan:     - PCS SW provided warm hand-off to Heme/BMT SW, Ariel Braun, for caregiver support follow-up.    Interventions      Flowsheet Row Most Recent Value   Interventions    Interventions Provided Consulted with Medical Team, [place] with primary team   Time Spent 15 min          We will sign off today, 05/07/2022. Please don't hesitate to contact us with questions: [phone].     Currently the patient remains at [place].      Code status at the time of sign off is Code Status: FULL     During the course of our consult, patient screened positive for: Pain: Positive  Non-pain symptoms:  Positive  Psychosocial needs: Positive  Spiritual needs:  Positive  Goals of care/Adv. care planning needs:  Positive     We intervened on the following issues: Pain, Non-pain symptoms, Psychosocial needs, Spiritual needs, and Goals of care/Adv. care planning needs    Other activities: None    Ariel Braun, MSW  Clinical Social Worker  [place] or [phone]

## 2022-05-08 LAB — POCT GLUCOSE: Glucose, Glucometer: 79 mg/dL (ref 70–199)

## 2022-05-08 MED FILL — CHOLECALCIFEROL (VITAMIN D3) 25 MCG (1,000 UNIT) TABLET: 1000 UNITS | ORAL | Qty: 2

## 2022-05-08 MED FILL — ONDANSETRON HCL 4 MG TABLET: 4 mg | ORAL | Qty: 2

## 2022-05-08 MED FILL — PREVYMIS 480 MG TABLET: 480 mg | ORAL | Qty: 1

## 2022-05-08 MED FILL — THIAMINE MONONITRATE (VITAMIN B1) 100 MG TABLET: 100 mg | ORAL | Qty: 1

## 2022-05-08 MED FILL — HYDROCODONE 5 MG-ACETAMINOPHEN 325 MG TABLET: 5-325 mg | ORAL | Qty: 1

## 2022-05-08 MED FILL — TAB-A-VITE 400 MCG TABLET: 400 mcg | ORAL | Qty: 1

## 2022-05-08 MED FILL — HYDROXYZINE HCL 25 MG TABLET: 25 mg | ORAL | Qty: 1

## 2022-05-08 MED FILL — ANTI-DIARRHEAL (LOPERAMIDE) 2 MG TABLET: 2 mg | ORAL | Qty: 2

## 2022-05-08 MED FILL — SPIRONOLACTONE 100 MG TABLET: 100 mg | ORAL | Qty: 1

## 2022-05-08 MED FILL — URSODIOL (BULK) 100 % POWDER: 100 % | Qty: 0.6

## 2022-05-08 MED FILL — FOLIC ACID 1 MG TABLET: 1 mg | ORAL | Qty: 1

## 2022-05-08 MED FILL — XIFAXAN 550 MG TABLET: 550 mg | ORAL | Qty: 1

## 2022-05-08 MED FILL — FUROSEMIDE 20 MG TABLET: 20 mg | ORAL | Qty: 2

## 2022-05-08 MED FILL — DILAUDID (PF) 0.5 MG/0.5 ML INJECTION SYRINGE: 0.5 mg/ mL | INTRAMUSCULAR | Qty: 0.5

## 2022-05-08 MED FILL — CYANOCOBALAMIN (VIT B-12) 1,000 MCG/ML INJECTION SOLUTION: 1000 mcg/mL | INTRAMUSCULAR | Qty: 1

## 2022-05-08 MED FILL — ONDANSETRON HCL (PF) 4 MG/2 ML INJECTION SOLUTION: 4 mg/2 mL | INTRAMUSCULAR | Qty: 4

## 2022-05-08 MED FILL — LANSOPRAZOLE 3 MG/ML ORAL SUSPENSION COMPOUNDING KIT: 3 mg/mL | ORAL | Qty: 10

## 2022-05-09 LAB — COMPLETE BLOOD COUNT WITH DIFFERENTIAL
Abs Basophils: 0.02 10*9/L (ref 0.00–0.10)
Abs Eosinophils: 0.05 10*9/L (ref 0.00–0.40)
Abs Imm Granulocytes: 0.07 10*9/L (ref <0.10)
Abs Lymphocytes: 0.87 10*9/L — ABNORMAL LOW (ref 1.00–3.40)
Abs Monocytes: 0.59 10*9/L (ref 0.20–0.80)
Abs Neutrophils: 4.77 10*9/L (ref 1.80–6.80)
Hematocrit: 28.3 % — ABNORMAL LOW (ref 36.0–46.0)
Hemoglobin: 8.7 g/dL — ABNORMAL LOW (ref 12.0–15.5)
MCH: 33.7 pg (ref 26.0–34.0)
MCHC: 30.7 g/dL — ABNORMAL LOW (ref 31.0–36.0)
MCV: 110 fL — ABNORMAL HIGH (ref 80–100)
MPV: 13.6 fL — ABNORMAL HIGH (ref 9.1–12.6)
Platelet Count: 117 10*9/L — ABNORMAL LOW (ref 140–450)
RBC Count: 2.58 10*12/L — ABNORMAL LOW (ref 4.00–5.20)
RDW-CV: 17.4 % — ABNORMAL HIGH (ref 11.7–14.4)
WBC Count: 6.4 10*9/L (ref 3.4–10.0)

## 2022-05-09 LAB — BILIRUBIN, TOTAL: Bilirubin, Total: 0.8 mg/dL (ref 0.2–1.2)

## 2022-05-09 LAB — BASIC METABOLIC PANEL (NA, K, CL, CO2, BUN, CR, GLU, CA)
Anion Gap: 6 (ref 4–14)
Calcium, total, Serum / Plasma: 8.2 mg/dL — ABNORMAL LOW (ref 8.4–10.5)
Carbon Dioxide, Total: 32 mmol/L — ABNORMAL HIGH (ref 22–29)
Chloride, Serum / Plasma: 101 mmol/L (ref 101–110)
Creatinine: 0.71 mg/dL (ref 0.55–1.02)
Glucose, non-fasting: 93 mg/dL (ref 70–199)
Potassium, Serum / Plasma: 4.3 mmol/L (ref 3.5–5.0)
Sodium, Serum / Plasma: 139 mmol/L (ref 135–145)
Urea Nitrogen, Serum / Plasma: 27 mg/dL — ABNORMAL HIGH (ref 7–25)
eGFRcr: 96 mL/min/{1.73_m2} (ref >59)

## 2022-05-09 LAB — ALKALINE PHOSPHATASE: Alkaline Phosphatase: 240 U/L — ABNORMAL HIGH (ref 38–108)

## 2022-05-09 LAB — MAGNESIUM, SERUM / PLASMA: Magnesium, Serum / Plasma: 2.1 mg/dL (ref 1.6–2.6)

## 2022-05-09 LAB — ALANINE TRANSAMINASE: Alanine transaminase: 21 U/L (ref 10–61)

## 2022-05-09 LAB — PHOSPHORUS, SERUM / PLASMA: Phosphorus, Serum / Plasma: 3.1 mg/dL (ref 2.3–4.7)

## 2022-05-09 LAB — ASPARTATE TRANSAMINASE: AST: 51 U/L — ABNORMAL HIGH (ref 5–44)

## 2022-05-09 MED FILL — ONDANSETRON HCL 4 MG TABLET: 4 mg | ORAL | Qty: 2

## 2022-05-09 MED FILL — SPIRONOLACTONE 100 MG TABLET: 100 mg | ORAL | Qty: 1

## 2022-05-09 MED FILL — URSODIOL (BULK) 100 % POWDER: 100 % | Qty: 0.6

## 2022-05-09 MED FILL — ANTI-DIARRHEAL (LOPERAMIDE) 2 MG TABLET: 2 mg | ORAL | Qty: 2

## 2022-05-09 MED FILL — HYDROCODONE 5 MG-ACETAMINOPHEN 325 MG TABLET: 5-325 mg | ORAL | Qty: 1

## 2022-05-09 MED FILL — HYDROXYZINE HCL 25 MG TABLET: 25 mg | ORAL | Qty: 1

## 2022-05-09 MED FILL — THIAMINE MONONITRATE (VITAMIN B1) 100 MG TABLET: 100 mg | ORAL | Qty: 1

## 2022-05-09 MED FILL — ONDANSETRON HCL (PF) 4 MG/2 ML INJECTION SOLUTION: 4 mg/2 mL | INTRAMUSCULAR | Qty: 4

## 2022-05-09 MED FILL — XIFAXAN 550 MG TABLET: 550 mg | ORAL | Qty: 1

## 2022-05-09 MED FILL — FOLIC ACID 1 MG TABLET: 1 mg | ORAL | Qty: 1

## 2022-05-09 MED FILL — FUROSEMIDE 20 MG TABLET: 20 mg | ORAL | Qty: 2

## 2022-05-09 MED FILL — PREVYMIS 480 MG TABLET: 480 mg | ORAL | Qty: 1

## 2022-05-09 MED FILL — DILAUDID (PF) 0.5 MG/0.5 ML INJECTION SYRINGE: 0.5 mg/ mL | INTRAMUSCULAR | Qty: 0.5

## 2022-05-09 MED FILL — MIRTAZAPINE 30 MG TABLET: 30 mg | ORAL | Qty: 1

## 2022-05-09 MED FILL — ERGOCALCIFEROL (VITAMIN D2) 200 MCG/ML (8,000 UNIT/ML) ORAL DROPS: 200 mcg/mL (8,000 unit/mL) | ORAL | Qty: 6.25

## 2022-05-09 MED FILL — LANSOPRAZOLE 3 MG/ML ORAL SUSPENSION COMPOUNDING KIT: 3 mg/mL | ORAL | Qty: 10

## 2022-05-09 MED FILL — TAB-A-VITE 400 MCG TABLET: 400 mcg | ORAL | Qty: 1

## 2022-05-09 MED FILL — CHOLECALCIFEROL (VITAMIN D3) 25 MCG (1,000 UNIT) TABLET: 1000 UNITS | ORAL | Qty: 2

## 2022-05-09 NOTE — Plan of Care (Signed)
Problem: Discharge Planning - Adult  Goal: Knowledge of and participation in plan of care  Outcome: Progress within 12 hours     Problem: Fall, at Risk or Actual - Adult  Goal: Absence of falls and fall related injury  Outcome: Progress within 12 hours     Problem: Delirium - Adult / Pediatric  Goal: Absence or resolution of delirium  Outcome: Progress within 12 hours     Problem: Nutrition, Alteration in -Adult  Goal: Adequate nutritional intake  Outcome: Progress within 12 hours  Goal: Maximize nutritional intake per patient condition  Outcome: Progress within 12 hours     Problem: Pressure Injury,At Risk and Actual- Adult  Goal: Absence of any new pressure injury  Outcome: Progress within 12 hours  Goal: Pressure injury healing/stabilization  Outcome: Progress within 12 hours  Goal: Participation in preventative efforts and treatment plan (Patient/Family/Caregiver)  Outcome: Progress within 12 hours  Goal: Adequate nutritional intake  Outcome: Progress within 12 hours     Problem: GI Elimination Impaired - Adult  Goal: Passage of soft, formed stool  Outcome: Progress within 12 hours  Goal: Stool elimination per clinical condition ( e.g. GI ostomies )  Outcome: Progress within 12 hours

## 2022-05-09 NOTE — Progress Notes (Addendum)
[facility] PROGRESS NOTE     My date of service is 05/09/22.     24 Hour Course  - No acute events overnight, HDS, afebrile  - 1x stool yesterday    Subjective  Feeling improved, has ongoing abdominal pain but it's not worse. Denies dyspnea, chest pain, diarrhea. Intermittent nausea. Taking a little bit of PO fluid, but doesn't have much appetite. Wants to understand the plan for her moving forward, asks questions about who is deciding on the plan for her. States she wants her daughter to help her with making decisions but her daughter is not always available to come to the hospital due to work.     Vitals  Temp:  [36.7 C (98.1 F)-37.5 C (99.5 F)] 36.7 C (98.1 F)  Heart Rate:  [78-87] 87  *Resp:  [16-18] 17  BP: (99-108)/(67-75) 101/67  SpO2:  [92 %-100 %] 100 %    MostRecent Weight: 73.7 kg (162 lb 7.7 oz)  Admission Weight: 66.2 kg (146 lb)      Intake/Output Summary (Last 24 hours) at 05/09/2022 0820  Last data filed at 05/09/2022 0600  Gross per 24 hour   Intake 1670 ml   Output 1150 ml   Net 520 ml       Physical Exam  Gen: NAD, answers questions slowly but appropriate and engaged  HEENT: MMM, no oral lesions, sclera non-icteric  Resp: normal work of breathing, lungs clear in anterior exam  Card: RRR, no murmurs gallops or rubs  Gi: Mildy distended, soft, non-tender, G tube in place no erythema or drainage  Ext: negative BLE edema   Skin: scattered ecchymosis to BUE  Neuro: Alert, oriented x2 (states 2023 for year but unable to state month or day), generally weak with strength symmetric, no sig asterixis  Lines: PIV x1 in place, clean dry & intact dressing    Scheduled Meds:   0.9% sodium chloride flush  3 mL Intravenous Q12H [place]    cholecalciferol (vitamin D3)  2,000 Units Per G Tube Daily [place]    cyanocobalamin (Vitamin B12)  1,000 mcg Subcutaneous Q7 Days    ergocalciferol (vitamin D2)  50,000 Units Per G Tube Q7 Days    folic acid  1 mg Per G Tube Daily [place]    furosemide  40 mg Per G Tube Daily  [place]    lansoprazole  30 mg Per G Tube Q AM Before Breakfast [facility]    letermovir  480 mg Other Daily [place]    loperamide  4 mg Feeding Tube Q8H    mirtazapine  30 mg Per G Tube Daily At Bedtime [facility]    multivitamin  1 tablet Per G Tube Daily [facility]    ondansetron  8 mg Per G Tube TID    rifAXIMin  550 mg Feeding Tube BID [facility]    spironolactone  100 mg Per G Tube Daily [place]    thiamine mononitrate (vit B1)  100 mg Per G Tube Daily [place]    ursodiol  600 mg Per G Tube BID [place]     Continuous Infusions:  PRN Meds:   0.9% sodium chloride flush  3 mL Intravenous PRN    acetaminophen  650 mg Per G Tube Q8H PRN    albuterol  2.5 mg Nebulization Q4H PRN    Or    albuterol  2.5 mg Nebulization Q4H PRN    aluminum-magnesium hydroxide-simethicone  15 mL Per G Tube Q4H PRN    HYDROcodone-acetaminophen  1-2  tablet Per G Tube Q6H PRN    HYDROmorphone  0.2-0.4 mg Intravenous Q4H PRN    HYDROmorphone  0.4 mg Intravenous Once PRN    hydrOXYzine  25 mg Feeding Tube Q4H PRN    ondansetron  8 mg Intravenous Q8H PRN    opium  6 mg Oral 4x Daily PRN    prochlorperazine  5 mg Intravenous Q8H PRN    Or    prochlorperazine  5 mg Per G Tube Q8H PRN       Data    CBC        05/09/22  0543 05/08/22  0627   WBC 6.4 6.9   HGB 8.7* 7.9*   HCT 28.3* 25.9*   PLT 117* 113*       Coags  No results found in last 36 hours    Chem7        05/09/22  0543 05/08/22  0627   NA 139 139   K 4.3 4.1   CL 101 103   CO2 32* 31*   BUN 27* 29*   CREAT 0.71 0.66   GLU 93 117       Electrolytes        05/09/22  0543 05/08/22  0627   CA 8.2* 8.2*   MG 2.1 2.0   PO4 3.1 3.4       Liver Panel        05/09/22  0543 05/08/22  0627   AST 51* 44   ALT 21 20   ALKP 240* 183*   TBILI 0.8 0.7   ALB  --  1.9*       UA  No results found in last 7 days    Microscopy:  No results found in last 7 days    Microbiology Results (last 72 hours)       Procedure Component Value Units Date/Time    Bacterial Culture, Normally Sterile Sites, With Gram Stain [[id number]] Collected: 04/23/22 0028    Order  Status: Completed Specimen: Not Applicable from Peritoneal Fluid Updated: 05/07/22 0936     Gram Stain No PMNs seen , Rare Host cells (not PMNs, RBCs, or squamous epithelial cells) , Few RBCs      No organisms seen     Bacterial Culture, Sterile Sites No growth 14 days.            Radiology Results  No results found.    I discussed the patient with Tilden Fossa, MD from [facility] regarding A&P.    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  2011: VRd bortezomib, Lenalidomide, Dexamethasone  04/2011: Auto SCT  07/2014: Rd - revlimid 15 mg every other day, dexamethasone 8 mg PO weekly.   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   Receiving dexamethasone 12 mg weekly for diarrhea as outpt  10/16/21 - last Daratumumab dose (on hold due to diarrhea)     DATA:   09/07/2010: IgG Kappa MM  12/02/2021: Ig M <5, IgG 447, IgA 20, Ig E <2, SFLC Kappa 16.6, Lambda 1.6, Kappa/Lamda 10.38  12/14/21: IgG K on IFE, IgG 430, KLC 16.6,  LLC 2.7, KLR 6.15  02/23/22 SPEP with stable M-spike, SFLC decreased Kappa 44.2 / Lambda 5.5 = K/L ratio = 8.04 (overall downtrending still)  04/23/22 M-spike 0.6, IgG K on IFE, IgG 997, Kappa 70.7/Lambda 19.8 = K/L ratio = 3.57  Monitor MM labs monthly per Dr Jacelyn Grip     Chemo:  TBD, therapy held since 10/2021 due to recurrent infections and now deconditioning  Not currently a candidate due to significant deconditioning, may be candidate in the future if improved.     Supportive Care:   Transfuse for Hgb <7 and Platelets <10  Access: PIV     Primary Oncologist:   Dr. Jacelyn Grip ([place]), will transfer to Dr. Hassell Done ([place])  Dr. Iona Coach ([place])     Disposition:  Lives in [place] - anticipate  return to home with [facility] vs SNF, currently working with PT  - Patient centered rounds: 6/20, awaiting SNF placement     # Immunocompromised  # Diarrhea  # c/b CMV Viremia  # Hx prolonged norovirus infection  Immunocompromised due to underlying  malignancy, not currently neutropenic. Recent extended hospitalization at both [place] 1/23-04/16/22, with hypovolemic shock associated with norovirus, CMV colitis, acute liver failure associated with pomylast (see ALF section).  Currently admitted for ongoing diarrhea and FTT in brief time at home after dc from [facility], initially began in 8/22, recently managed at home w/ loperamide BID w/ 2-3 watery stools daily. Diarrhea worsened on 6/12 (11 stools). Possibly r/t to inadequate dosing of loperamide vs infectious etiology. GI c/s 6/13 for possible scope - per patient and family declined endoscopy at this time.  Given CMV improving, norovirus negative, and enteric panel unrevealing - favor non-infectious etiologies for diarrhea. ID has requested we maintain enteric precautions for hospitalization d/t prolonged prior GI infections.   Overall diarrhea is improved and manageable with loperamide TID     Data:  Norovirus  First +norovirus 08/2021 with persistent +norovirus s/p 7-day courses x 2 nitazoxanide (2/16-02/22/23 & 01/2022). Continued to be norovirus positive (most recently on 04/07/22). 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 Her CMV (iu/mL) 443,961--> 179,555--> 74 (2/22) --> 176 (3/1) --> 105 (6/9) --> 70 (6/20)    6/9: C. Diff neg, GI bacterial/viral/parasite panels neg, norovirus neg     Plan  - Continue letermovir indefinitely per Dr Jacelyn Grip on 6/21 (6/16- )  - Trend weekly CMV PCR, next draw 6/27  - Patient and family defer endoscopy (offered by GI previously)  - Revisit if  medical mgmt ineffective; diarrhea currently resolved  - Decreased loperamide q8h ATC 6/23  - Continue tincture of opium QID PRN  - Defer ID consultation in absence of new ID findings on enteric workup     # Hx Acute liver failure, now requiring paracentesis  # Pericentral/sinusioidal and periportal fibrosis, ductal reaction with focal pericholangitis from vanishing bile duct in the setting of pomalidomide.   # Hepatic Encephalopathy  Diagnostic paracentesis negative for SBP. Malnutrition may also be contributing to ascites (see below). New report of hallucination on 6/23, resolved. Likely component of delirium     Dx:  - 6/17 KUB: mild ileus and density over abdomen (likely ascitic fluid) - not clinically significant as patient is having regular bowel movements and no PVR noted.  - 6/20 therapeutic paracentesis by TAPS, -  4L + albumin  - 6/23 ammonia normal    Plan:  -Continue spironolactone ('100mg'$ ) / lasix ('40mg'$ ) to minimize accumulation of ascites   (started 6/16, increased 6/19- )  Spironolactone w/ hold parameters for hypotension  Will likely need ~q weekly therapeutic paracentesis inpt/outpt - will need to be arranged for d/c   -Continue Ursodiol 600 mg PO BID  -Continue rifaximin 550 mg PO BID  -Continue midodrine 10 mg PO TID  -Low sodium diet  -2L fluid restriction  -Delirium precautions     # Failure to thrive  # PEG tube dependence  # Macrocytic anemia - B12 deficiency  PEG tube placed during last admission. Low B12 level but MMA normal, which is not consistent with B12 deficiency, however still treating at this time as below. Encouraged PO intake in addition to tube feeds, pt amenable on 6/22.   -Continue TF - Nutren 1.5 71m/hr   61/61 RD rec'd cyclic tube feeds w/ Nutren 1.5 at 75 mL/hr for 16 hours from 18:00 to 10:00.    Check residuals every 8 hours during feeding time and hold if equal to or greater than 500 mL.  Pt declined transition to cyclic feeds on 60/96 consider in future  -Continue  cyanocobalamin 10020m SQ weekly (6/24- )   -Recheck INR - if elevated would give vitamin K   -s/p Cyanocobalamin 100047mSQ daily x7 days (6/17-6/23)  -s/p Zinc acetate 6/11-6/23)  -Pending copper level    # Dysphagia  Dysphagia and aspiration risk assessed by speech language pathology. SLP recommended Modified Barium Swallow (completed on 6/21) which showed moderate pharyngeal aspiration; SLP recommends regular diet w/ mildly thickened liquids.   6/22 - Pt and daughter request to thicken liquids themselves at bedside for more variety, understand risk of aspiration w/ thins. Liberalized diet per their request to regular.   Appreciate SLP recs.   - See failure to thrive section as well re: PEG tube and nutritional deficiencies     # Pain  # Abdominal cramping   # Mental anguish  Discussed on 6/21 and pain r/t pressure injuries as well as abdominal pain limit pt's participation in PT/OT. Recommended pain management prior to/after PT/OT sessions to improve tolerance.   -Consulted Palliative Care 6/17 for SMS, now s/o  -Continue hydrocodone-acetaminophen PRN  -Continue hydromorphone PRN  -Increased mirtazapine to '30mg'$  nightly (6/18) per palliative recs     # Physical Deconditioning  Pt motivated to participate w/ PT/OT, deconditioned after prior extended hospitalization (as above)  - Continue PT/OT   - recommend placement SNF    #GOC  Pt is consistent and clear that she wishes to pursue full medical management of her ongoing medical problems, as above, with the goal of prolonging her life. She is motivated by her desire to spend time with her family. This is consistent in conversations with [facility] (6/18) & Dr. WonJacelyn Grip/21).     VTE PPx:  Contraindicated: Anticipated procedure    Severity of Illness  Stable    Nutritional Assessment  Severe Chronic disease or condition related malnutrition    During this hospitalization the patient is also being treated for:  - Anemia associated with malignancy (present on admission)     - Thrombocytopenia (present on admission)    - Coagulopathy (present on admission)    - Malignancy associated fatigue on admission  - Neoplasm/malignancy associated pain  - Hypoalbuminemia (present on admission)  - HYPOnatremia (present on admission)  - Fluid status: Volume overload on admission  -  Cachexia  - Acute liver failure, present on admission    Code Status: FULL    I spent a total time of 50 minutes in this episode of care preparing to see the patient, obtaining and/or reviewing separately obtained history, performing a medically appropriate examination and/or evaluation, counseling and educating patient/family/caregiver, ordering and reviewing medications, tests, or procedures, referring and communicating with other health care professionals, documenting clinical information in the electronic or other health record, and communicating results to the patient/family/caregiver, and/or other care coordination.    Festus Aloe, MD  05/09/22

## 2022-05-10 LAB — COMPLETE BLOOD COUNT WITH DIFFERENTIAL
Abs Basophils: 0.02 10*9/L (ref 0.00–0.10)
Abs Eosinophils: 0.05 10*9/L (ref 0.00–0.40)
Abs Imm Granulocytes: 0.06 10*9/L (ref <0.10)
Abs Lymphocytes: 0.83 10*9/L — ABNORMAL LOW (ref 1.00–3.40)
Abs Monocytes: 0.61 10*9/L (ref 0.20–0.80)
Abs Neutrophils: 4.78 10*9/L (ref 1.80–6.80)
Hematocrit: 26.2 % — ABNORMAL LOW (ref 36.0–46.0)
MCH: 32.2 pg (ref 26.0–34.0)
MCHC: 29.8 g/dL — ABNORMAL LOW (ref 31.0–36.0)
MCV: 108 fL — ABNORMAL HIGH (ref 80–100)
MPV: 12.4 fL (ref 9.1–12.6)
Platelet Count: 126 10*9/L — ABNORMAL LOW (ref 140–450)
RBC Count: 2.42 10*12/L — ABNORMAL LOW (ref 4.00–5.20)
RDW-CV: 17.6 % — ABNORMAL HIGH (ref 11.7–14.4)

## 2022-05-10 LAB — BASIC METABOLIC PANEL (NA, K, CL, CO2, BUN, CR, GLU, CA)
Anion Gap: 7 (ref 4–14)
Calcium, total, Serum / Plasma: 8 mg/dL — ABNORMAL LOW (ref 8.4–10.5)
Carbon Dioxide, Total: 28 mmol/L (ref 22–29)
Chloride, Serum / Plasma: 103 mmol/L (ref 101–110)
Creatinine: 0.76 mg/dL (ref 0.55–1.02)
Glucose, non-fasting: 95 mg/dL (ref 70–199)
Potassium, Serum / Plasma: 4.6 mmol/L (ref 3.5–5.0)
Urea Nitrogen, Serum / Plasma: 26 mg/dL — ABNORMAL HIGH (ref 7–25)
eGFRcr: 89 mL/min/{1.73_m2} (ref >59)

## 2022-05-10 LAB — PROTHROMBIN TIME
INR: 1.3 — ABNORMAL HIGH (ref 0.9–1.2)
PT: 15.6 s — ABNORMAL HIGH (ref 11.6–15.0)

## 2022-05-10 LAB — BILIRUBIN, TOTAL: Bilirubin, Total: 0.7 mg/dL (ref 0.2–1.2)

## 2022-05-10 LAB — ASPARTATE TRANSAMINASE: AST: 52 U/L — ABNORMAL HIGH (ref 5–44)

## 2022-05-10 LAB — PHOSPHORUS, SERUM / PLASMA: Phosphorus, Serum / Plasma: 2.9 mg/dL (ref 2.3–4.7)

## 2022-05-10 LAB — ALKALINE PHOSPHATASE: Alkaline Phosphatase: 230 U/L — ABNORMAL HIGH (ref 38–108)

## 2022-05-10 LAB — MAGNESIUM, SERUM / PLASMA: Magnesium, Serum / Plasma: 2 mg/dL (ref 1.6–2.6)

## 2022-05-10 LAB — ALBUMIN, SERUM / PLASMA: Albumin, Serum / Plasma: 1.9 g/dL — ABNORMAL LOW (ref 3.4–4.8)

## 2022-05-10 LAB — COPPER, SERUM/PLASMA: Copper, serum/plasma: 47 ug/dL — ABNORMAL LOW (ref 70–175)

## 2022-05-10 LAB — ALANINE TRANSAMINASE: Alanine transaminase: 20 U/L (ref 10–61)

## 2022-05-10 MED ORDER — MULTIVITAMIN-IRON 9 MG-FOLIC ACID 400 MCG-CALCIUM AND MINERALS TABLET
9 | Freq: Every day | ORAL | Status: DC
Start: 2022-05-10 — End: 2022-05-27
  Administered 2022-05-11 – 2022-05-12 (×3): 1 via GASTROSTOMY
  Administered 2022-05-13 – 2022-05-27 (×15): via GASTROSTOMY

## 2022-05-10 MED ORDER — PEDIATRIC MULTIVITAMIN WITH IRON AND OTHER MINERALS CHEWABLE TABLET
Freq: Every day | ORAL | Status: DC
Start: 2022-05-10 — End: 2022-05-10
  Administered 2022-05-10: 21:00:00 via GASTROSTOMY

## 2022-05-10 MED FILL — URSODIOL (BULK) 100 % POWDER: 100 % | Qty: 0.6

## 2022-05-10 MED FILL — CHOLECALCIFEROL (VITAMIN D3) 25 MCG (1,000 UNIT) TABLET: 1000 UNITS | ORAL | Qty: 2

## 2022-05-10 MED FILL — ONDANSETRON HCL 4 MG TABLET: 4 mg | ORAL | Qty: 2

## 2022-05-10 MED FILL — FLINTSTONES COMPLETE (IRON) CHEWABLE TABLET: ORAL | Qty: 1

## 2022-05-10 MED FILL — HYDROXYZINE HCL 25 MG TABLET: 25 mg | ORAL | Qty: 1

## 2022-05-10 MED FILL — THERA M PLUS (FERROUS FUMARATE) 9 MG IRON-400 MCG TABLET: 9 mg iron-400 mcg | ORAL | Qty: 1

## 2022-05-10 MED FILL — ANTI-DIARRHEAL (LOPERAMIDE) 2 MG TABLET: 2 mg | ORAL | Qty: 2

## 2022-05-10 MED FILL — MIRTAZAPINE 30 MG TABLET: 30 mg | ORAL | Qty: 1

## 2022-05-10 MED FILL — FOLIC ACID 1 MG TABLET: 1 mg | ORAL | Qty: 1

## 2022-05-10 MED FILL — PREVYMIS 480 MG TABLET: 480 mg | ORAL | Qty: 1

## 2022-05-10 MED FILL — HYDROCODONE 5 MG-ACETAMINOPHEN 325 MG TABLET: 5-325 mg | ORAL | Qty: 1

## 2022-05-10 MED FILL — TAB-A-VITE 400 MCG TABLET: 400 mcg | ORAL | Qty: 1

## 2022-05-10 MED FILL — XIFAXAN 550 MG TABLET: 550 mg | ORAL | Qty: 1

## 2022-05-10 MED FILL — SPIRONOLACTONE 100 MG TABLET: 100 mg | ORAL | Qty: 1

## 2022-05-10 MED FILL — FUROSEMIDE 20 MG TABLET: 20 mg | ORAL | Qty: 2

## 2022-05-10 MED FILL — LANSOPRAZOLE 3 MG/ML ORAL SUSPENSION COMPOUNDING KIT: 3 mg/mL | ORAL | Qty: 10

## 2022-05-10 MED FILL — PROCHLORPERAZINE EDISYLATE 10 MG/2 ML (5 MG/ML) INJECTION SOLUTION: 10 mg/2 mL (5 mg/mL) | INTRAMUSCULAR | Qty: 2

## 2022-05-10 MED FILL — THIAMINE MONONITRATE (VITAMIN B1) 100 MG TABLET: 100 mg | ORAL | Qty: 1

## 2022-05-10 NOTE — Consults (Signed)
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 is making appreciable gains in PT warranting continued skilled PT interventions. AO x 4. Speech fluent and spontaneous. Linear. Improving activity and exercise tolerance. Tolerates separate rehab sessions. Pt is able to tolerate sitting on EOB x 10 mins now with KUSSB of 3 from 2. Performs dynamic activities while sitting on EOB performing task oriented activities in diagonal pattern to optimize sitting balance dynamically. Pt performs AAROME on B UE and LE while sitting on EOB for jt health. Dep x 1 chair to bed transfer using mechanical lift. Rolling L<>R x 4 bouts using side rails for linen change, Mod A x 1. Pt will continue to benefit from acute skilled PT interventions to progress functional limitations and to prevent progression of hospital acquired NM weakness. Thank you for this referral.    Focus next session: Sitting tolerate EOB, trial STEDY or SARA plus when appropriate    RECOMMENDATIONS  DISCHARGE RECOMMENDATION  Comments Placement with ongoing PT needs  pending medical optimization and progress while in house   Patient Current Functional Status Sufficient for PT Discharge Recommendation No   Discharge DME needs hospital bed, [facility] lift, wheelchair   Discharge transportation needs [facility] Potential Patient participates well in therapy and progressing towards goals     NURSING RECOMMENDATIONS  Inpatient Rehab Assistive Device Recommendation Vertical dependent lift;Ceiling lift;Lateral transfer device   Activity Recommendation up ad lib with nursing     SUBJECTIVE  Subjective report:  Agreeable, pleasant  Notable observations:  Left supine in bed. ALarm active. RN notified    SYSTEMS REVIEW  Cardiopulmonary  Cardiopulmonary deficits:  Yes  Detail:  decreased activity  tolerance    Neuromuscular  Neuromuscular deficits:  Yes           Pain     Currently in pain: Yes  Pain location: abdominal pain  Pain scale: Wong-Baker  Alleviators/aggravators/intervention: R lower quadrant after sitting EOB     Pain Level Upon Arrival: Hurts a little bit  Highest Pain Level During Therapy: Hurts a little bit  Pain Level End of Therapy: Hurts a little bit    COMPREHENSIVE MOVEMENT ANALYSIS/TREATMENT  Precautions/WB status: Yes  Precautions and weight bearing status comments: Falls, delirium, skin, aspiration, contact      Functional Mobility     Balance  Balance deficits noted:      Communication between other health care providers: Communication between other health care provider: RN  Communication comment: goals of therapy discussion; pt's functional status     Education assessment  Learner: Patient  Content: Plan of care, Activity recommendations, Discharge recommendations  Response: Needs reinforcement, Verbalizes understanding, Demonstrates understanding  Outcome measures   Physical Therapist Global Assessment of Mobility  Activity Achieved: Passive transfer to bed  AMPAC 6-clicks basic mobility score: 8      PLAN  Plan of care status:  Current plan of care remains appropriate  PT frequency:  5x/week  PT duration:  4 weeks.  Comment:  POC expires 7/10      Planned PT interventions:   Specific interventions: Progressive functional mobility training;Balance training;NM Re-ed;Aerobic training;Ther ex  Education interventions: Fall risk reduction;Exercise program;Self-pacing/breathing;Benefits of activity;Caregiver training  Comment:          Berlinda Last,  PT    05/10/2022

## 2022-05-10 NOTE — Plan of Care (Signed)
Problem: Discharge Planning - Adult  Goal: Knowledge of and participation in plan of care  Outcome: Progress within 12 hours     Problem: Fall, at Risk or Actual - Adult  Goal: Absence of falls and fall related injury  Outcome: Progress within 12 hours     Problem: Delirium - Adult / Pediatric  Goal: Absence or resolution of delirium  Outcome: Progress within 12 hours     Problem: Nutrition, Alteration in -Adult  Goal: Adequate nutritional intake  Outcome: Progress within 12 hours  Goal: Maximize nutritional intake per patient condition  Outcome: Progress within 12 hours     Problem: Pressure Injury,At Risk and Actual- Adult  Goal: Absence of any new pressure injury  Outcome: Progress within 12 hours  Goal: Pressure injury healing/stabilization  Outcome: Progress within 12 hours  Goal: Participation in preventative efforts and treatment plan (Patient/Family/Caregiver)  Outcome: Progress within 12 hours  Goal: Adequate nutritional intake  Outcome: Progress within 12 hours     Problem: GI Elimination Impaired - Adult  Goal: Passage of soft, formed stool  Outcome: Progress within 12 hours  Goal: Stool elimination per clinical condition ( e.g. GI ostomies )  Outcome: Progress within 12 hours

## 2022-05-10 NOTE — Progress Notes (Addendum)
[facility] PROGRESS NOTE     This is an independent service.   The available consultant for this service is Alena Bills, MD.           My date of service is 05/10/22.     24 Hour Course  - No acute events overnight, HDS, afebrile  - Abdomen distended, requested paracentesis by TAPS on 6/27  - Copper level 47, started MVI w/ copper  - Start cyclic tube feeds at 75 mL/hr for 16 hours from 18:00 to 10:00     Subjective  Pt sitting in bedside chair, feeling OK today. She has some intermittent upper abdominal pain but it is stable. She feels bloated and would like a paracentesis. No diarrhea. Is feeling less confused today. She does not have much appetite and is agreeable to trying cyclic tube feeds. improved, has ongoing abdominal pain but it's not worse. Denies dyspnea, chest pain, nausea.    Vitals  Temp:  [36.8 C (98.2 F)-37.4 C (99.3 F)] 37.2 C (99 F)  Heart Rate:  [85-86] 86  *Resp:  [15-20] 15  BP: (94-102)/(64-71) 94/66  SpO2:  [95 %-98 %] 97 %    MostRecent Weight: 73.7 kg (162 lb 7.7 oz)  Admission Weight: 66.2 kg (146 lb)      Intake/Output Summary (Last 24 hours) at 05/10/2022 1037  Last data filed at 05/10/2022 0800  Gross per 24 hour   Intake 1470 ml   Output 325 ml   Net 1145 ml       Physical Exam  Gen: NAD, appropriate and engaged  HEENT: MMM, no oral lesions, sclera non-icteric  Resp: normal work of breathing, CTAB  Card: RRR  Gi: distended, non-tender, G tube in place no erythema or drainage  Ext: negative BLE edema   Skin: scattered ecchymosis to BUE  Neuro: Alert, oriented x2-3, generally weak with strength symmetric, no sig asterixis  Lines: PIV x1 in place, clean dry & intact dressing    Scheduled Meds:   0.9% sodium chloride flush  3 mL Intravenous Q12H [place]    cholecalciferol (vitamin D3)  2,000 Units Per G Tube Daily [place]    cyanocobalamin (Vitamin B12)  1,000 mcg Subcutaneous Q7 Days    ergocalciferol (vitamin D2)  50,000 Units Per G Tube Q7 Days    folic acid  1 mg  Per G Tube Daily [place]    furosemide  40 mg Per G Tube Daily [place]    lansoprazole  30 mg Per G Tube Q AM Before Breakfast [facility]    letermovir  480 mg Other Daily [place]    loperamide  4 mg Feeding Tube Q8H    mirtazapine  30 mg Per G Tube Daily At Bedtime [facility]    multivitamin complete chewable  1 tablet Feeding Tube Daily [facility]    ondansetron  8 mg Per G Tube TID    rifAXIMin  550 mg Feeding Tube BID [facility]    spironolactone  100 mg Per G Tube Daily [place]    thiamine mononitrate (vit B1)  100 mg Per G Tube Daily [place]    ursodiol  600 mg Per G Tube BID [place]     Continuous Infusions:  PRN Meds:   0.9% sodium chloride flush  3 mL Intravenous PRN    acetaminophen  650 mg Per G Tube Q8H PRN    albuterol  2.5 mg Nebulization Q4H PRN    Or    albuterol  2.5 mg Nebulization Q4H  PRN    aluminum-magnesium hydroxide-simethicone  15 mL Per G Tube Q4H PRN    HYDROcodone-acetaminophen  1-2 tablet Per G Tube Q6H PRN    HYDROmorphone  0.2-0.4 mg Intravenous Q4H PRN    HYDROmorphone  0.4 mg Intravenous Once PRN    hydrOXYzine  25 mg Feeding Tube Q4H PRN    opium  6 mg Oral 4x Daily PRN    prochlorperazine  5 mg Intravenous Q8H PRN    Or    prochlorperazine  5 mg Per G Tube Q8H PRN       Data    CBC        05/10/22  0608 05/09/22  0543   WBC 6.4 6.4   HGB 7.8* 8.7*   HCT 26.2* 28.3*   PLT 126* 117*       Coags        05/10/22  0608   INR 1.3*       Chem7        05/10/22  0608 05/09/22  0543   NA 138 139   K 4.6 4.3   CL 103 101   CO2 28 32*   BUN 26* 27*   CREAT 0.76 0.71   GLU 95 93       Electrolytes        05/10/22  0608 05/09/22  0543   CA 8.0* 8.2*   MG 2.0 2.1   PO4 2.9 3.1       Liver Panel        05/10/22  0608 05/09/22  0543   AST 52* 51*   ALT 20 21   ALKP 230* 240*   TBILI 0.7 0.8   ALB 1.9*  --        UA  No results found in last 7 days    Microscopy:  No results found in last 7 days    Microbiology Results (last 72 hours)       Procedure Component Value Units Date/Time    Helicobacter pylori Antigen [[id number]]     Order Status: Sent  Specimen: Stool             Radiology Results  No results found.    I discussed the patient with Johnney Ou* from [facility] regarding A&P.    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  2011: VRd bortezomib, Lenalidomide, Dexamethasone  04/2011: Auto SCT  07/2014: Rd - revlimid 15 mg every other day, dexamethasone 8 mg PO weekly.   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   Receiving dexamethasone 12 mg weekly for diarrhea as outpt  10/16/21 - last Daratumumab dose (on hold due to diarrhea)     DATA:   09/07/2010: IgG Kappa MM  12/02/2021: Ig M <5, IgG 447, IgA 20, Ig E <2, SFLC Kappa 16.6, Lambda 1.6, Kappa/Lamda 10.38  12/14/21: IgG K on IFE, IgG 430, KLC 16.6, LLC 2.7, KLR 6.15  02/23/22 SPEP with stable M-spike, SFLC decreased Kappa 44.2 / Lambda 5.5 = K/L ratio = 8.04 (overall downtrending still)  04/23/22 M-spike 0.6, IgG K on IFE, IgG 997, Kappa 70.7/Lambda 19.8 =  K/L ratio = 3.57  Monitor MM labs monthly per Dr Jacelyn Grip, next due 7/9     Chemo:  TBD, therapy held since 10/2021 due to recurrent infections and now deconditioning  Not currently a candidate due to significant deconditioning, may be candidate in the future if improved.     Supportive Care:   Transfuse for Hgb <7 and Platelets <10  Access: PIV     Primary Oncologist:   Dr. Jacelyn Grip ([place]), will transfer to Dr. Hassell Done ([place])  Dr. Iona Coach ([place])     Disposition:  Lives in [place] - anticipate return to home with [facility] vs SNF, currently working with PT  - Patient centered rounds: 6/20, awaiting SNF placement     # Immunocompromised  # Diarrhea  # c/b CMV Viremia  # Hx prolonged norovirus infection  Immunocompromised due to underlying   malignancy, not currently neutropenic. Recent extended hospitalization at both [place] 1/23-04/16/22, with hypovolemic shock associated with norovirus, CMV colitis, acute liver failure associated with pomylast (see ALF section).  Currently admitted for ongoing diarrhea and FTT in brief time at home after dc from [facility] ([facility]), initially began in 8/22, recently managed at home w/ loperamide BID w/ 2-3 watery stools daily. Diarrhea worsened on 6/12 (11 stools). Possibly r/t to inadequate dosing of loperamide vs infectious etiology. GI c/s 6/13 for possible scope - per patient and family declined endoscopy at this time.  Given CMV improving, norovirus negative, and enteric panel unrevealing - favor non-infectious etiologies for diarrhea. ID has requested we maintain enteric precautions for hospitalization d/t prolonged prior GI infections.   Overall diarrhea is improved and manageable with loperamide TID     Data:  Norovirus  First +norovirus 08/2021 with persistent +norovirus s/p 7-day courses x 2 nitazoxanide (2/16-02/22/23 & 01/2022). Continued to be norovirus positive (most recently on 04/07/22). 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 Her CMV (iu/mL) 443,961--> 179,555--> 74 (2/22) --> 176 (3/1) --> 105 (6/9) --> 70 (6/20)    6/9: C. Diff neg, GI bacterial/viral/parasite panels neg, norovirus neg     Plan  - Continue letermovir indefinitely per Dr Jacelyn Grip on 6/21 (6/16- )  - Trend weekly CMV PCR, next draw 6/27  - Patient and family defer endoscopy (offered by GI previously)  - Revisit if medical mgmt ineffective; diarrhea currently resolved  - Decreased loperamide q8h ATC 6/23  - Continue tincture of opium QID PRN  - Defer ID consultation in absence of new ID findings on enteric workup     # Hx Acute liver failure, now requiring  paracentesis  # Pericentral/sinusioidal and periportal fibrosis, ductal reaction with focal pericholangitis from vanishing bile duct in the setting of pomalidomide.   # Hepatic Encephalopathy  Diagnostic paracentesis negative for SBP. Malnutrition may also be contributing to ascites (see below). New report of hallucination on 6/23, resolved. Likely component of delirium     Dx:  - 6/17 KUB: mild ileus and density over abdomen (likely ascitic fluid) - not clinically significant as patient is having regular bowel movements and no PVR noted.  - 6/20 therapeutic paracentesis by TAPS, -4L + albumin  - 6/23 ammonia normal  - 6/27 requested paracentesis by TAPS w/ cell count/diff, cytology, albumin, bacterial gram stain, aerobic & anaerobic cx, LDH, bilirubin, amylase    Plan:  -Continue  spironolactone ('100mg'$ ) / lasix ('40mg'$ ) to minimize accumulation of ascites   (started 6/16, increased 6/19- )  Spironolactone w/ hold parameters for hypotension  Will likely need ~q weekly therapeutic paracentesis inpt/outpt - will need to be arranged for d/c   -Continue Ursodiol 600 mg PO BID  -Continue rifaximin 550 mg PO BID  -Continue midodrine 10 mg PO TID  -Low sodium diet  -2L fluid restriction  -Delirium precautions     # Failure to thrive  # PEG tube dependence  # Macrocytic anemia - B12 deficiency  PEG tube placed during last admission. Low B12 level but MMA normal, which is not consistent with B12 deficiency, however still treating at this time as below. Encouraged PO intake in addition to tube feeds, pt amenable on 6/22.   -Continue TF - Nutren 1.5 44m/hr   63/15 RD rec'd cyclic tube feeds w/ Nutren 1.5 at 75 mL/hr for 16 hours from 18:00 to 10:00.    Check residuals every 8 hours during feeding time and hold if equal to or greater than 500 mL.  Pt declined transition to cyclic feeds on 61/76 consider in future  -Continue cyanocobalamin 10089m SQ weekly (6/24- )   -Start Thera-M Plus MVI for copper deficiency (Copper = 47 on  6/16)  -INR 1.3, consider Vit K  -s/p Cyanocobalamin 100068mSQ daily x7 days (6/17-6/23)  -s/p Zinc acetate 6/11-6/23)    # Dysphagia  Dysphagia and aspiration risk assessed by speech language pathology. SLP recommended Modified Barium Swallow (completed on 6/21) which showed moderate pharyngeal aspiration; SLP recommends regular diet w/ mildly thickened liquids.   6/22 - Pt and daughter request to thicken liquids themselves at bedside for more variety, understand risk of aspiration w/ thins. Liberalized diet per their request to regular.   Appreciate SLP recs.   - See failure to thrive section as well re: PEG tube and nutritional deficiencies     # Pain  # Abdominal cramping   # Mental anguish  Discussed on 6/21 and pain r/t pressure injuries as well as abdominal pain limit pt's participation in PT/OT. Recommended pain management prior to/after PT/OT sessions to improve tolerance.   -Consulted Palliative Care 6/17 for SMS, now s/o  -Continue hydrocodone-acetaminophen PRN  -Continue hydromorphone PRN  -Increased mirtazapine to '30mg'$  nightly (6/18) per palliative recs     # Physical Deconditioning  Pt motivated to participate w/ PT/OT, deconditioned after prior extended hospitalization (as above)  - Continue PT/OT   - recommend placement SNF    #GOC  Pt is consistent and clear that she wishes to pursue full medical management of her ongoing medical problems, as above, with the goal of prolonging her life. She is motivated by her desire to spend time with her family. This is consistent in conversations with [facility] (6/18) & Dr. WonJacelyn Grip/21).     VTE PPx:  Contraindicated: Anticipated procedure    Severity of Illness  Stable    Nutritional Assessment  Severe Chronic disease or condition related malnutrition    During this hospitalization the patient is also being treated for:  - Anemia associated with malignancy (present on admission)    - Thrombocytopenia (present on admission)    - Coagulopathy (present on admission)     - Malignancy associated fatigue on admission  - Neoplasm/malignancy associated pain  - Hypoalbuminemia (present on admission)  - HYPOnatremia (present on admission)  - Fluid status: Volume overload on admission  - Cachexia  - Acute liver failure, present on admission  Code Status: FULL    I spent a total time of 60 minutes in this episode of care preparing to see the patient, obtaining and/or reviewing separately obtained history, performing a medically appropriate examination and/or evaluation, counseling and educating patient/family/caregiver, ordering and reviewing medications, tests, or procedures, referring and communicating with other health care professionals, documenting clinical information in the electronic or other health record, and communicating results to the patient/family/caregiver, and/or other care coordination.    [name], NP  05/10/22

## 2022-05-10 NOTE — Consults (Signed)
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 Pt p/w globally weak trunk requiring mod to max A for sitting balance in order to engage in bathing. Pt limited by progressive fatigue EOB however tolerated hoyer transfer to continue ADLs in chair. Pt more alert upon arrival and cognitively engaged in session. Pt reported difficulty with memory and recall which demosntrated improved insight regarding impairments. Pt receptive to cueing and redirection during session. Pt continues to be limited by prolonged bed rest, weakness and deconditioning requiring max A for ADLs. Recommend placement.  Progress with OT: Good progress  Current maximal level of assist: Maximal assist  Next session focus:      Recommendations    Recommended Discharge Disposition Placement for continued therapy   Discharge DME Recommendations Defer to next level of care   Equipment Recommendations     DME Comment     Discharge Recommendations Comment     Rehab Potential Patient participates well in therapy and is progressing towards goal   Anticipated Assistance Available at Discharge Family;Spouse/Significant other   Anticipated Type of Assistance Available at Discharge Assist as needed   Anticipated Time of Assistance Available at Discharge Unknown at this time   Barriers to Discharge Medical issues   Patient's current functional ability appropriate for D/C recommendations Yes     Inpatient Recommendations  OT Inpatient Recommendations Bed in chair position;Encourage participation in ADLs;Delirium precautions;Out of bed for meals and ADLs;Frequent turns   Recommendation Comments Hoyer lift to high back chair   Current Maximal Level of Assist Needed Maximal assist      Brace/Precautions   Brace/Orthotic/Prosthetic:   Precautions:Has weight bearing limitation or precaution: Yes  Precautions and weight bearing status comments: Falls, delirium, skin, aspiration, contact     Subjective Report:"I am supposed to have occupational therapy at 9:30"    Patient/Family Goal:     Objective Findings and Interventions    Areas of Occupation    Grooming and Light Hygiene Supervision/safety;Set up   [place] with oral care to insure cleanliness. washed face   Self Feeding     Cueing Required     Comment     Toileting     Cueing Required     Toileting Clothing Management     Cueing Required     Comment     Upper Body Dressing Minimal assistance   Cueing Required     Comment doff and don gown   Lower Body Dressing Maximal assistance   Cueing Required     Comment don socks   Upper Body Bathing Minimal assistance   Cueing Required     Comment CHG wipes EOB   Lower Body Bathing Moderate assistance   Cueing Required     Comment CHG wipes EOB, assist with lower LEs   ADL/IADL Comment         Functional Transfers    Bed Mobility From Supine   Bed Mobility To Sitting EOB   Level of Assist Moderate assistance   Cueing Required     Transfer From Sit   Transfer To Chair   Level of Assist Dependent   Cueing Required     Technique  Functional Transfer Comment Pt more alert upon arrival and recognized therapist. Pt supine to sit w/ HOB elevated and mod A. Pt intially reporting urge to have BM, then resovled, offerred BSC but pt deferred. Pt tolerated sitting EOB for 20 minutes engaging in UB/LB bathing. Pt limited by progressive abdominal pain, hoyer lift 2p transfer to chair. Pt engaged in grooming in chair. Pt tolerated 45 min session well.         [place] for ADLs  6 Click Score: 14    Client Factors     Cognition comment:Oriented to year/month/hospital unable to recall which hospital, day of the week. Pt p/w decreased STM and impaired attention however more  alert and engaged cogntively during session.    Problem solving;Safety awareness;Information processing;Problem ID;Initiation;Organization;Sequencing;Self-regulation;Working Chief of Staff functions comment:                     Roma Schanz, OT  05/10/2022 11:40 AM

## 2022-05-10 NOTE — Consults (Signed)
Hospital Medicine Procedure Service Consultation Note    My date of service is 05/10/2022.    Consulting service: CRI/BMT    Reason for consult:       -----------------Paracentesis------------------, Diagnostic evaluation of ascites, and Management of symptomatic ascites    HPI: Ariel Braun is a 61 y.o. female with multiple myeloma and diarrhea and ascites.    BP 93/71 (BP Location: Left upper arm, Patient Position: Lying)   Pulse 84 Comment: 84  Temp 37 C (98.6 F) (Oral)   Resp 16   Wt 73.7 kg (162 lb 7.7 oz)   SpO2 100%   BMI 31.48 kg/m    Exam:  The patient was not seen/examined today (non-billing consult note)    Lab Results   Component Value Date    INR 1.3 (H) 05/10/2022    PT 15.6 (H) 05/10/2022      Lab Results   Component Value Date    Platelet Count 126 (L) 05/10/2022      Urea Nitrogen, Serum / Plasma (mg/dL)   Date Value   05/10/2022 26 (H)      Creatinine (mg/dL)   Date Value   05/10/2022 0.76     Creatinine Whole Blood (mg/dL)   Date Value   06/05/2014 0.8            Risk assessment (Link to guidance):            ----------Para/Thora/Arthrocentesis Average Risk------------  Average risk as evidenced by:         - Platelets and INR within guidance         - No clinical suspicion for low fibrinogen         - No/held anticoagulants or ok to be given per guidance    Assessment and plan:  Procedure team unable to perform procedure today. The procedure team will next be available to reassess on 6/27 .         The primary team was contacted regarding our recommendations.  The procedure service does not typically follow patients following procedure completion/decision unless otherwise stated above.   Please contact the procedure service pager at [phone] with any questions or new procedure requests.    Earley Brooke, MD

## 2022-05-11 LAB — COMPLETE BLOOD COUNT WITH DIFFERENTIAL
Abs Basophils: 0.01 10*9/L (ref 0.00–0.10)
Abs Eosinophils: 0.05 10*9/L (ref 0.00–0.40)
Abs Imm Granulocytes: 0.04 10*9/L (ref <0.10)
Abs Lymphocytes: 0.81 10*9/L — ABNORMAL LOW (ref 1.00–3.40)
Abs Monocytes: 0.59 10*9/L (ref 0.20–0.80)
Abs Neutrophils: 5.44 10*9/L (ref 1.80–6.80)
Hematocrit: 25.7 % — ABNORMAL LOW (ref 36.0–46.0)
Hemoglobin: 7.8 g/dL — ABNORMAL LOW (ref 12.0–15.5)
MCH: 33.3 pg (ref 26.0–34.0)
MCHC: 30.4 g/dL — ABNORMAL LOW (ref 31.0–36.0)
MCV: 110 fL — ABNORMAL HIGH (ref 80–100)
MPV: 13.4 fL — ABNORMAL HIGH (ref 9.1–12.6)
Platelet Count: 125 10*9/L — ABNORMAL LOW (ref 140–450)
RBC Count: 2.34 10*12/L — ABNORMAL LOW (ref 4.00–5.20)
RDW-CV: 17.6 % — ABNORMAL HIGH (ref 11.7–14.4)
WBC Count: 6.9 10*9/L (ref 3.4–10.0)

## 2022-05-11 LAB — CELL COUNT AND  DIFFERENTIAL, BODY FLUID
Conc Smear,BFL; # Cells: 100
Mono,Histio,Mesothel: 33 %
Neuts, BFL: 9 %
RBCs, BFL: 0.027 10*9/L
Total Nucleated Cells, Body Fluid: 0.105 10*9/L

## 2022-05-11 LAB — ALKALINE PHOSPHATASE: Alkaline Phosphatase: 246 U/L — ABNORMAL HIGH (ref 38–108)

## 2022-05-11 LAB — ALANINE TRANSAMINASE: Alanine transaminase: 20 U/L (ref 10–61)

## 2022-05-11 LAB — BILIRUBIN, TOTAL: Bilirubin, Total: 0.6 mg/dL (ref 0.2–1.2)

## 2022-05-11 LAB — BASIC METABOLIC PANEL (NA, K, CL, CO2, BUN, CR, GLU, CA)
Carbon Dioxide, Total: 30 mmol/L — ABNORMAL HIGH (ref 22–29)
Chloride, Serum / Plasma: 100 mmol/L — ABNORMAL LOW (ref 101–110)
Creatinine: 0.67 mg/dL (ref 0.55–1.02)
Sodium, Serum / Plasma: 138 mmol/L (ref 135–145)
eGFRcr: 99 mL/min/{1.73_m2} (ref >59)

## 2022-05-11 LAB — BILIRUBIN, TOTAL, BODY FLUID: Bilirubin, Total, Body Fluid: 0.3 mg/dL

## 2022-05-11 LAB — MAGNESIUM, SERUM / PLASMA: Magnesium, Serum / Plasma: 2 mg/dL (ref 1.6–2.6)

## 2022-05-11 LAB — LACTATE DEHYDROGENASE, BODY FLUID: LDH, Fluid: 66 U/L

## 2022-05-11 LAB — ASPARTATE TRANSAMINASE: AST: 47 U/L — ABNORMAL HIGH (ref 5–44)

## 2022-05-11 LAB — PHOSPHORUS, SERUM / PLASMA: Phosphorus, Serum / Plasma: 2.8 mg/dL (ref 2.3–4.7)

## 2022-05-11 LAB — PROTEIN, TOTAL, BODY FLUID: Protein, Total, Body Fluid: 1.3 g/dL

## 2022-05-11 LAB — ALBUMIN, BODY FLUID: Albumin, Body Fluid: 0.7 g/dL

## 2022-05-11 LAB — AMYLASE, BODY FLUID: Amylase, Body Fluid: 10 U/L

## 2022-05-11 MED ORDER — ALBUMIN, HUMAN 25 % INTRAVENOUS SOLUTION
25 | INTRAVENOUS | Status: AC
Start: 2022-05-11 — End: 2022-05-11
  Administered 2022-05-11 (×2): 50 mL via INTRAVENOUS
  Administered 2022-05-11: 19:00:00 via INTRAVENOUS

## 2022-05-11 MED ORDER — ALBUMIN, HUMAN 25 % INTRAVENOUS SOLUTION
25 | INTRAVENOUS | Status: DC
Start: 2022-05-11 — End: 2022-05-11

## 2022-05-11 MED ORDER — LIDOCAINE (PF) 10 MG/ML (1 %) INJECTION SOLUTION
10 mg/mL (1 %) | INTRAMUSCULAR | Status: AC
  Administered 2022-05-11: 18:00:00 10 mL via SUBCUTANEOUS

## 2022-05-11 MED ORDER — SIMETHICONE 80 MG CHEWABLE TABLET
80 | Freq: Four times a day (QID) | ORAL | Status: DC | PRN
Start: 2022-05-11 — End: 2022-05-11

## 2022-05-11 MED ORDER — SIMETHICONE 80 MG CHEWABLE TABLET
80 | Freq: Four times a day (QID) | ORAL | Status: DC | PRN
Start: 2022-05-11 — End: 2022-05-12
  Administered 2022-05-12: 08:00:00 80 mg via GASTROSTOMY

## 2022-05-11 MED ORDER — LOPERAMIDE 2 MG TABLET
2 | Freq: Two times a day (BID) | ORAL | Status: DC
Start: 2022-05-11 — End: 2022-05-14
  Administered 2022-05-12: 17:00:00 via GASTROSTOMY
  Administered 2022-05-12: 04:00:00 4 mg via GASTROSTOMY
  Administered 2022-05-13 – 2022-05-14 (×4): via GASTROSTOMY

## 2022-05-11 MED ORDER — LIDOCAINE HCL 10 MG/ML (1 %) INJECTION SOLUTION: 10 mg/mL (1 %) | INTRAMUSCULAR | Status: DC

## 2022-05-11 MED FILL — LIDOCAINE HCL 10 MG/ML (1 %) INJECTION SOLUTION: 10 mg/mL (1 %) | INTRAMUSCULAR | Qty: 10

## 2022-05-11 MED FILL — ALBUTEIN 25 % INTRAVENOUS SOLUTION: 25 % | INTRAVENOUS | Qty: 50

## 2022-05-11 MED FILL — ONDANSETRON HCL 4 MG TABLET: 4 mg | ORAL | Qty: 2

## 2022-05-11 MED FILL — THERA M PLUS (FERROUS FUMARATE) 9 MG IRON-400 MCG TABLET: 9 mg iron-400 mcg | ORAL | Qty: 1

## 2022-05-11 MED FILL — HYDROXYZINE HCL 25 MG TABLET: 25 mg | ORAL | Qty: 1

## 2022-05-11 MED FILL — CHOLECALCIFEROL (VITAMIN D3) 25 MCG (1,000 UNIT) TABLET: 1000 UNITS | ORAL | Qty: 2

## 2022-05-11 MED FILL — XIFAXAN 550 MG TABLET: 550 mg | ORAL | Qty: 1

## 2022-05-11 MED FILL — MIRTAZAPINE 30 MG TABLET: 30 mg | ORAL | Qty: 1

## 2022-05-11 MED FILL — ACETAMINOPHEN 325 MG TABLET: 325 mg | ORAL | Qty: 2

## 2022-05-11 MED FILL — HYDROCODONE 5 MG-ACETAMINOPHEN 325 MG TABLET: 5-325 mg | ORAL | Qty: 2

## 2022-05-11 MED FILL — LIDOCAINE (PF) 10 MG/ML (1 %) INJECTION SOLUTION: 10 mg/mL (1 %) | INTRAMUSCULAR | Qty: 10

## 2022-05-11 MED FILL — SPIRONOLACTONE 100 MG TABLET: 100 mg | ORAL | Qty: 1

## 2022-05-11 MED FILL — FOLIC ACID 1 MG TABLET: 1 mg | ORAL | Qty: 1

## 2022-05-11 MED FILL — THIAMINE MONONITRATE (VITAMIN B1) 100 MG TABLET: 100 mg | ORAL | Qty: 1

## 2022-05-11 MED FILL — ANTI-DIARRHEAL (LOPERAMIDE) 2 MG TABLET: 2 mg | ORAL | Qty: 2

## 2022-05-11 MED FILL — FUROSEMIDE 20 MG TABLET: 20 mg | ORAL | Qty: 2

## 2022-05-11 MED FILL — URSODIOL (BULK) 100 % POWDER: 100 % | Qty: 0.6

## 2022-05-11 MED FILL — PREVYMIS 480 MG TABLET: 480 mg | ORAL | Qty: 1

## 2022-05-11 MED FILL — LANSOPRAZOLE 3 MG/ML ORAL SUSPENSION COMPOUNDING KIT: 3 mg/mL | ORAL | Qty: 10

## 2022-05-11 NOTE — Plan of Care (Signed)
Problem: Discharge Planning - Adult  Goal: Knowledge of and participation in plan of care  Outcome: Progress within 12 hours     Problem: Fall, at Risk or Actual - Adult  Goal: Absence of falls and fall related injury  Outcome: Progress within 12 hours     Problem: Delirium - Adult / Pediatric  Goal: Absence or resolution of delirium  Outcome: Progress within 12 hours     Problem: Nutrition, Alteration in -Adult  Goal: Adequate nutritional intake  Outcome: Progress within 12 hours  Goal: Maximize nutritional intake per patient condition  Outcome: Progress within 12 hours     Problem: Pressure Injury,At Risk and Actual- Adult  Goal: Absence of any new pressure injury  Outcome: Progress within 12 hours  Goal: Pressure injury healing/stabilization  Outcome: Progress within 12 hours  Goal: Participation in preventative efforts and treatment plan (Patient/Family/Caregiver)  Outcome: Progress within 12 hours  Goal: Adequate nutritional intake  Outcome: Progress within 12 hours     Problem: GI Elimination Impaired - Adult  Goal: Passage of soft, formed stool  Outcome: Progress within 12 hours  Goal: Stool elimination per clinical condition ( e.g. GI ostomies )  Outcome: Progress within 12 hours

## 2022-05-11 NOTE — Consults (Signed)
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  Pt required min A for rolling in the bed for placement of hover mat and was transferred laterally to the combi with 2P assist.  She was able to stand at 35 degrees x5 minutes but was having increased pain in her gastrocs and feet 2/2 muscle tightness.  Placed towel under heels to off weight and able to tolerate an additional 10 minutes in standing at 45 degrees with participation in ADLs and UE/LE therex.  Combi placed in chair position and left in seated position with family at bedside.  Overall this pt continues to benefit from skilled PT to address impairments, maximize functional mobility, and restore tolerance to activity.    Focus next session: activity tolerance, EOB tolerance, combi    RECOMMENDATIONS  DISCHARGE RECOMMENDATION  Comments Placement with ongoing PT needs  to placement at this time   Patient Current Functional Status Sufficient for PT Discharge Recommendation Yes   Discharge DME needs TBD, defer if going to placement   Discharge transportation needs [facility] Potential Patient participates well in therapy and progressing towards goals     NURSING RECOMMENDATIONS  Inpatient Rehab Assistive Device Recommendation Vertical dependent lift;Ceiling lift;Lateral transfer device   Activity Recommendation OOB to chair via sling lift     SUBJECTIVE  Subjective report:  Agreeable, pleasant  Notable observations:  pt left in combi, family at bedside, all needs met and within reach, RN updated on pt status    SYSTEMS REVIEW  Cognition/Communication  Delirium screen:  Yes Delirium Screen:   Details:  at risk d/t  immobility    Communication  Cognition/communication impaired:  Yes     Communication comment or intervention : slow to respond at times  Behavior: Self-limiting  Behavior comment: needs encouragement    Integumentary  Integumentary deficits:  YesIntegumentary deficit detail:  PIV, PEG    Cardiopulmonary  Cardiopulmonary deficits:  Yes  Detail:  decreased activity tolerance    Musculoskeletal  Musculoskeletal deficits:  Yes  Abnormal strength findings: B LE 3-/5  Abnormal range of motion findings: gastroc tightness ankles unable to achieve neutral, shoulder flexion AAROM ~120 degrees    Neuromuscular  Neuromuscular deficits:  Yes  Motor control or coordination: impaired LEs        Pain     Currently in pain: Yes  Pain location: calf and foot pain with weight bearing  Pain scale: Wong-Baker  Pain description: pulling, pressure  Alleviators/aggravators/intervention: improved with heel support     Pain Level Upon Arrival: Hurts a little bit  Highest Pain Level During Therapy: Hurts whole lot  Pain Level End of Therapy: Hurts a little bit    COMPREHENSIVE MOVEMENT ANALYSIS/TREATMENT  Precautions/WB status: Yes  Precautions and weight bearing status comments: Falls, delirium, skin, aspiration, contact      Hemodynamic response:   Normal hemodynamic response: Yes  Response:     Comments: VSS on RA      Functional Mobility  Requires second person/additional health care providers: Yes  Type additional health care provider: OT    Bed mobility Current Initial    Rolling  Level of assist  Minimal assist  Minimal assist (05/11/22  1331)   Device  Bed rail  Bed rail (05/11/22 1331)   Intervention  Verbal cues  Verbal cues (05/11/22 1331)   Rolling comments:     Transfer Current Initial   Sit < > Stand  Level of assist     Device     Intervention     Transfer  Level of assist  From: Bed  To: Combilizer  Dependent assist;Two person assist  Dependent assist;Two person assist (04/29/22 1209)   Device   (hover mat)  Vertical dependent  lift (04/29/22 1209)   Intervention     Sit < > Stand comments:   Transfer comments:   lateral transfer to combi    Balance  Balance deficits noted: Yes  Functional Balance for ADLs  Position Static Dynamic   Sit  -      -        Stand Static Standing level of assist: Maximal assist-   Static standing comment: combilizer  -        Exercise/other interventions  Standing exercises comment (other exercise, details): standing in combi x15 minutes, 5 minutes at 35 degrees and 10  minutes at 45 degrees, mini squats and shoulder flexion in semi standing position    Communication between other health care providers: Communication between other health care provider: OT;RN  Communication comment:       Education Careers adviser: Patient  Content: Plan of care, Activity recommendations, Discharge recommendations  Response: Needs reinforcement, Verbalizes understanding, Demonstrates understanding  Outcome measures   Physical Therapist Global Assessment of Mobility  Activity Achieved: Passive stand  AMPAC 6-clicks basic mobility score: 9      PLAN  Plan of care status:  Current plan of care remains appropriate  PT frequency:  5x/week  PT duration:  4 weeks.  Comment:       Planned PT interventions:   Specific interventions:    Education interventions:    Comment:          Armanda Heritage, PT    05/11/2022

## 2022-05-11 NOTE — Procedures (Signed)
PROCEDURE NOTE    Patient Name: Ariel Braun  [id number]   August 03, 1961                            Diagnoses:  1. Multiple myeloma and immunoproliferative neoplasms (CMS code)    2. Abdominal distension    3. Norovirus    4. Other ascites        Paracentesis    Date/Time: 05/11/2022 12:05 PM  Performed by: Darlin Drop, MD  Authorized by: Alena Bills, MD     Consent Process:     Emergent situation preventing consent process:  No    Discussion with patient included the following (comment on exceptions):  Diagnosis (the reason for which the procedure is being proposed) and proposed treatment or procedure, Risks, benefits, side effects, likelihood of success of the proposed treatment, anticipated recuperation, and the alternative options and Patient's questions related to the procedure were addressed  Universal Protocol - Time Out Checklist:     Patient ID verified:  Yes    Surgical/procedural site and side verified:  Yes    Site marking verified:  Yes  Pre-procedure details:     Procedure purpose:  Diagnostic and therapeutic    Skin preparation:  Chloraprep    Preparation: Patient was prepped and draped in sterile fashion    Pre-procedure medications (see MAR for exact dosages):     Procedural sedation: patient not sedated    Anesthetic used:  Topical application  Procedure details:     Ultrasound guidance: yes      Puncture site:  L lower quadrant    Needle gauge:  18    Catheter size:  8 Fr    Number of attempts:  1      Fluid amount removed (ml): 5000    Fluid appearance:  Serous    Fluid sent for: cell count/diff, bacterial culture, albumin, protein, cytology and see orders for fluid testing    Dressing: gauze and adhesive bandage  Post-procedure details:     25% albumin IV repletion indicated: 25% albumin IV repletion indicated    Repletion amount (ml): 150    Patient tolerance of procedure:  Tolerated well, no immediate complications  Comments:      Pt tolerated procedure well. 5L serous fluid  removed. 133m IV albumin given.     ADarlin Drop MD  05/11/2022

## 2022-05-11 NOTE — Interdisciplinary (Signed)
SOCIAL WORK NOTE    Patient is a 61 y.o. female with Multiple myeloma and immunoproliferative neoplasms (CMS code) who was referred to Social Work for supportive services.    Data:     Patient Information  Referral Source: Other (Comment) (self)  Presenting Concerns: Emotional support, Caregiver support  Mode of Contact: In-person, Phone  Source of Information: Patient, Family  Services/Teams: Malignant Hematology    Assessment:     SW attempted visit with Pt. She reported tired from procedure and did not wish for visit. Pt receptive to SW communicating with Pt's daughter to offer support.     SW spoke with [name] who shared coping appropriately at this time. She shared eager to know if Pt could be placed in SNF. She shared challenges from Pt's previous discharge home. Caregiver reported financial hardship with hiring help. Caregiver requested SW assist should no SNF placement. SW provided reflective listening and reassurance. SW will collaborate with CM and support Pt/family as needed.    Caregiver continuing to remain in [place] at [place] with her brother to offer Pt emotional support. [name] denied any concerns with her employment as she continues to have work flexibility. SW offered caregiver supportive resources. She denied any resources at this time.     Interventions/Plan:     Interventions      Flowsheet Row Most Recent Value   Interventions    Interventions Provided Advocacy, Supportive counseling   Palliative Care Collaboration with primary team   Time Spent 15 min               Enid Baas, MPH  Clinical Social Worker   Inpatient Hematology/Oncology  Phone:  [phone]

## 2022-05-11 NOTE — Consults (Signed)
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 Pt tolerated standing well at 45 degrees with heel support. Pt able to engage in grooming in standing, and UE therex. Pt more alert and talkative after standing in combilizer and engaging with daughter. Pt continues to p/w global weakness in LEs, UEs and trunk. Pt making good progress but still p/w decreased trunk strength and LE strength for stedy transfers or squat pivot transfers for bathing or toileting. Recommend placement.  Progress with OT: Good progress  Current maximal level of assist: Maximal assist  Next session focus:      Recommendations    Recommended Discharge Disposition Placement for continued therapy   Discharge DME Recommendations Defer to next level of care   Equipment Recommendations     DME Comment     Discharge Recommendations Comment     Rehab Potential Patient participates well in therapy and is progressing towards goal   Anticipated Assistance Available at Discharge Family;Spouse/Significant other   Anticipated Type of Assistance Available at Discharge Assist as needed   Anticipated Time of Assistance Available at Discharge Unknown at this time   Barriers to Discharge Medical issues   Patient's current functional ability appropriate for D/C recommendations Yes     Inpatient Recommendations  OT Inpatient Recommendations Bed in chair position;Encourage participation in ADLs;Delirium precautions;Out of bed for meals and ADLs;Frequent turns   Recommendation Comments Hoyer lift to high back chair   Current Maximal Level of Assist Needed Maximal assist     Brace/Precautions   Brace/Orthotic/Prosthetic:   Precautions:Has weight bearing  limitation or precaution: Yes  Precautions and weight bearing status comments: Falls, delirium, skin, aspiration, contact     Subjective Report:"We did well today"    Patient/Family Goal:     Objective Findings and Interventions    Areas of Occupation    Grooming and Light Hygiene Contact guard assistance   Cueing Required     Comment wash face and brush teeth standing in combilizer   Self Feeding     Cueing Required     Comment     Toileting     Cueing Required     Toileting Clothing Management     Cueing Required     Comment     Upper Body Dressing     Cueing Required     Comment     Lower Body Dressing     Cueing Required     Comment     Upper Body Bathing     Cueing Required     Comment     Lower Body Bathing     Cueing Required     Comment     ADL/IADL Comment         Functional Transfers    Bed Mobility From Supine;Sidelying   Bed Mobility To Sidelying;Supine   Level of Assist Minimal assistance;Moderate assistance   Cueing Required     Transfer From Bed   Transfer To Other (comment);Chair   Level of Assist Dependent   Cueing Required     Technique Lateral   Functional Transfer Comment 2p lateral transfer to combilizer. Pt tolerated 35 degrees for 5 minutes reported increased  calf and foot pain. Pt tolerated supine rest break engaging in AROM of UEs. Pt tolerated 15 min at 45 degrees well and able to tolerate transition to chair.     Functional Balance for ADLs  Position Static Dynamic   Sit  -      -        Stand Static Standing level of assist: Maximal assist-   Static standing comment: combilizer  [employer] for ADLs  6 Click Score: 14    Client Factors  Cognitive deficits noted in the following area(s):: Attention, Memory, Judgement/Safety  Cognition comment:     Problem solving;Safety awareness;Information processing;Problem ID;Initiation;Organization;Sequencing;Self-regulation;Working Chief of Staff functions comment:                     Roma Schanz, OT  05/11/2022 2:35  PM

## 2022-05-11 NOTE — Progress Notes (Addendum)
[facility] PROGRESS NOTE     This is an independent service.   The available consultant for this service is Alena Bills, MD.           My date of service is 05/11/22.     24 Hour Course  - No acute events overnight, HDS, afebrile  - Paracentesis (5L) with albumin replacement by TAPS  - Weight bearing w/ OT & comobilizer support for first time in 5 months  - Diarrhea controlled to 1-2 stools/day; decreased loperamide to '4mg'$  BID & added simethicone for gas    Subjective   Sitting in bed comfortably, reports she feels she may be leaning more towards constipation than diarrhea. Is amenable to reducing loperamide dose. Having occasional abdominal pain and gas pain, looking forward to some relief from paracentesis. Appetite still minimal. Denies dyspnea, chest pain, nausea. Updated pt's family at bedside.     Vitals  Temp:  [36.8 C (98.3 F)-37.2 C (99 F)] 36.9 C (98.4 F)  Heart Rate:  [82-92] 84  *Resp:  [16] 16  BP: (92-103)/(67-79) 100/79  SpO2:  [96 %-100 %] 100 %    MostRecent Weight: 73.7 kg (162 lb 7.7 oz)  Admission Weight: 66.2 kg (146 lb)      Intake/Output Summary (Last 24 hours) at 05/11/2022 1307  Last data filed at 05/11/2022 1155  Gross per 24 hour   Intake 2090 ml   Output 5700 ml   Net -3610 ml       Physical Exam  Gen: NAD, appropriate and engaged  HEENT: MMM, no oral lesions, sclera non-icteric  Resp: normal work of breathing, CTAB, no rhonchi/wheezing  Card: RRR  Gi: distended, non-tender  Ext: negative BLE edema   Skin: scattered ecchymosis to BUE  Neuro: Alert, oriented x2-3, no focal neuro deficits  Lines: PIV x1 in place, clean dry & intact dressing    Scheduled Meds:   0.9% sodium chloride flush  3 mL Intravenous Q12H [place]    cholecalciferol (vitamin D3)  2,000 Units Per G Tube Daily [place]    cyanocobalamin (Vitamin B12)  1,000 mcg Subcutaneous Q7 Days    ergocalciferol (vitamin D2)  50,000 Units Per G Tube Q7 Days    folic acid  1 mg Per G Tube Daily [place]    furosemide  40  mg Per G Tube Daily [place]    lansoprazole  30 mg Per G Tube Q AM Before Breakfast [facility]    letermovir  480 mg Other Daily [place]    loperamide  4 mg Feeding Tube Q12H SCH    mirtazapine  30 mg Per G Tube Daily At Bedtime [facility]    multivitamin with iron-minerals-folic acid  1 tablet Feeding Tube Daily SCH    ondansetron  8 mg Per G Tube TID    rifAXIMin  550 mg Feeding Tube BID [facility]    spironolactone  100 mg Per G Tube Daily [place]    thiamine mononitrate (vit B1)  100 mg Per G Tube Daily [place]    ursodiol  600 mg Per G Tube BID [place]     Continuous Infusions:  PRN Meds:   0.9% sodium chloride flush  3 mL Intravenous PRN    acetaminophen  650 mg Per G Tube Q8H PRN    albuterol  2.5 mg Nebulization Q4H PRN    Or    albuterol  2.5 mg Nebulization Q4H PRN    aluminum-magnesium hydroxide-simethicone  15 mL Per G Tube Q4H PRN  HYDROcodone-acetaminophen  1-2 tablet Per G Tube Q6H PRN    HYDROmorphone  0.2-0.4 mg Intravenous Q4H PRN    HYDROmorphone  0.4 mg Intravenous Once PRN    hydrOXYzine  25 mg Feeding Tube Q4H PRN    opium  6 mg Oral 4x Daily PRN    prochlorperazine  5 mg Intravenous Q8H PRN    Or    prochlorperazine  5 mg Per G Tube Q8H PRN       Data    CBC        05/11/22  0620 05/10/22  0608   WBC 6.9 6.4   HGB 7.8* 7.8*   HCT 25.7* 26.2*   PLT 125* 126*       Coags        05/10/22  0608   INR 1.3*         Chem7        05/11/22  0620 05/10/22  0608   NA 138 138   K 4.0 4.6   CL 100* 103   CO2 30* 28   BUN 29* 26*   CREAT 0.67 0.76   GLU 119 95       Electrolytes        05/11/22  0620 05/10/22  0608   CA 7.9* 8.0*   MG 2.0 2.0   PO4 2.8 2.9       Liver Panel        05/11/22  0620 05/10/22  0608   AST 47* 52*   ALT 20 20   ALKP 246* 230*   TBILI 0.6 0.7   ALB  --  1.9*       UA  No results found in last 7 days    Microscopy:  No results found in last 7 days    Microbiology Results (last 72 hours)       Procedure Component Value Units Date/Time    Cytomegalovirus DNA, Quantitative PCR, Plasma [[id number]] Collected: 05/11/22 0620     Order Status: Sent Specimen: Blood from Serum Updated: 05/11/22 0825    Bacterial Culture, Normally Sterile Sites, With Gram Stain [[id number]]     Order Status: Sent Specimen: Not Applicable from Peritoneal Fluid     Helicobacter pylori Antigen [[id number]]     Order Status: Sent Specimen: Stool             Radiology Results  No results found.    I discussed the patient with Johnney Ou* from [facility] regarding A&P.    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  2011: VRd bortezomib, Lenalidomide, Dexamethasone  04/2011: Auto SCT  07/2014: Rd - revlimid 15 mg every other day, dexamethasone 8 mg PO weekly.   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   Receiving dexamethasone 12 mg weekly for diarrhea as outpt  10/16/21 - last Daratumumab dose (on hold due to diarrhea)     DATA:   09/07/2010: IgG Kappa MM  12/02/2021: Ig M <5, IgG 447, IgA 20, Ig E <2, SFLC Kappa 16.6, Lambda 1.6, Kappa/Lamda 10.38  12/14/21: IgG K on IFE, IgG 430, KLC 16.6,  LLC 2.7, KLR 6.15  02/23/22 SPEP with stable M-spike, SFLC decreased Kappa 44.2 / Lambda 5.5 = K/L ratio = 8.04 (overall downtrending still)  04/23/22 M-spike 0.6, IgG K on IFE, IgG 997, Kappa 70.7/Lambda 19.8 = K/L ratio = 3.57  Monitor MM labs monthly per Dr Jacelyn Grip, next due 7/9     Chemo:  TBD, therapy held since 10/2021 due to recurrent infections and now deconditioning  Not currently a candidate due to significant deconditioning, may be candidate in the future if improved.     Supportive Care:   Transfuse for Hgb <7 and Platelets <10  Access: PIV     Primary Oncologist:   Dr. Jacelyn Grip ([place]), will transfer to Dr. Hassell Done  ([place])  Dr. Iona Coach ([place])     Disposition:  Lives in [place] - anticipate discharge to SNF, currently working with PT  - Patient centered rounds: 6/20, awaiting SNF placement     # Immunocompromised  # Diarrhea - controlled  # c/b CMV Viremia  # Hx prolonged norovirus infection  Immunocompromised due to underlying  malignancy, not currently neutropenic. Recent extended hospitalization at both [place] 1/23-04/16/22, with hypovolemic shock associated with norovirus, CMV colitis, acute liver failure associated with pomylast (see ALF section).  Currently admitted for ongoing diarrhea and FTT in brief time at home after dc from [facility], initially began in 8/22, recently managed at home w/ loperamide BID w/ 2-3 watery stools daily. Diarrhea worsened on 6/12 (11 stools). Possibly r/t to inadequate dosing of loperamide vs infectious etiology. GI c/s 6/13 for possible scope - per patient and family declined endoscopy at this time. ID consultation deferred as infectious w/u negative.   Given CMV improving, norovirus negative, and enteric panel unrevealing - favor non-infectious etiologies for diarrhea. ID has requested we maintain enteric precautions for hospitalization d/t prolonged prior GI infections. Diarrhea controlled on current regimen to 1-2 stools/day as of 6/27.     Data  - Norovirus  - First +norovirus 08/2021 with persistent +norovirus s/p 7-day courses x 2 nitazoxanide (2/16-02/22/23 & 01/2022). Continued to be norovirus positive (most recently on 04/07/22). Norovirus, cdiff, GI PCR neg this admission (6/9).   - CMV  -Regarding CMV colitis, 12/17/21 flex sig showed CMV colitis, sp multiple cycles of gancyclovir IV, IVIG, valcyte 900 mg PO BID, then ultimately transitioned to letermovir, which she continues. 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   # Hepatic Encephalopathy  Steady rise in LFTs began 05/2021. Acute rise in 11/2021,coinciding with acute CMV viremia but liver biopsy CMV stains negative (12/11/21). 12/14/21: Hepatitis/viral work-up negative in 11/2021. LP was done on 12/16/21 for hepatic encephalopathy w/ CMV CSF PCR negative. Suspected to be 2/2 pomalidamide, though liver path from 12/22/21 unlikely for vanishing bile duct syndrome.   Malnutrition may also be contributing to ascites (see below). Upon admission was more somnolent and one episode of possible visual hallucination reported on 6/23.  Her mentation has improved throughout this hospitalization, currently A&Ox3 as of 6/27.    Dx:  - 6/17 KUB: mild ileus and density over abdomen (likely ascitic fluid) - not clinically significant as patient is having regular bowel movements and no PVR noted.  - 6/20 therapeutic paracentesis by TAPS, -4L + albumin  - 6/23 ammonia normal  - 6/27 paracentesis by TAPS -5L + albumin, SBP studies pending '[ ]'$      Plan:  -Continue spironolactone ('100mg'$ ) / lasix ('40mg'$ ) to minimize accumulation of ascites   (started 6/16, increased 6/19- )  Spironolactone w/ hold parameters for hypotension  Will likely need ~q weekly therapeutic paracentesis inpt/outpt - will need to be arranged for d/c   -Continue Ursodiol 600 mg PO BID  -Continue rifaximin 550 mg PO BID  -Continue midodrine 10 mg PO TID  -Low sodium diet  -2L fluid restriction  -Delirium precautions     # Failure to thrive  # PEG tube dependence  # Macrocytic anemia - B12 deficiency  PEG tube placed during last admission. Low B12 level but MMA normal,  which is not consistent with B12 deficiency, however still treating at this time as below. Encouraged PO intake in addition to tube feeds, pt amenable on 6/22.   -Continue TF - Nutren 1.5 63m/hr   66/96 RD rec'd cyclic tube feeds w/ Nutren 1.5 at 75 mL/hr for 16 hours from 18:00 to 10:00.    Check residuals every 8 hours during feeding time and hold if equal to or greater than 500 mL.  Pt declined transition to cyclic feeds on 67/89 consider in future  -Continue cyanocobalamin 10091m SQ weekly (6/24- )   -Start Thera-M Plus MVI for copper deficiency (Copper = 47 on 6/16)  -Continue mirtazapine  '30mg'$  nightly (incr 6/18- ) per palliative recs  -INR 1.3, consider Vit K  -s/p Cyanocobalamin 100064mSQ daily x7 days (6/17-6/23)  -s/p Zinc acetate 6/11-6/23)    # Dysphagia  Dysphagia and aspiration risk assessed by speech language pathology. SLP recommended Modified Barium Swallow (completed on 6/21) which showed moderate pharyngeal aspiration; SLP recommends regular diet w/ mildly thickened liquids.   6/22 - Pt and daughter request to thicken liquids themselves at bedside for more variety, understand risk of aspiration w/ thins. Liberalized diet per their request to regular.   Appreciate SLP recs.   - See failure to thrive section as well re: PEG tube and nutritional deficiencies     # Pain  # Pressure Injury (sacrum)  Pain primarily r/t pressure injuries though occasionally also d/t abdominal cramping. Wound healing likely limited d/t poor nutritional status (see above) and limited mobility (improving).  -Consider wound care RN c/s  -Consulted Palliative Care 6/17 for SMS, now s/o  -Continue hydrocodone-acetaminophen PRN  -Continue hydromorphone PRN     # Physical Deconditioning  Pt motivated to participate w/ PT/OT, deconditioned after prior extended hospitalization (as above)  - Continue PT/OT   - recommend placement SNF    #GOC  Pt is consistent and clear that she wishes to pursue full medical management of her  ongoing medical problems, as above, with the goal of prolonging her life. She is motivated by her desire to spend time with her family. This is consistent in conversations with [facility] (6/18) & Dr. WonJacelyn Grip/21).     VTE PPx:  Contraindicated: Anticipated procedure    Severity of Illness  Stable    Nutritional Assessment  Severe Chronic disease or condition related malnutrition  During this hospitalization the patient is also being treated for:  - Anemia associated with malignancy (present on admission)    - Thrombocytopenia (present on admission)    - Coagulopathy (present on admission)    - Malignancy associated fatigue on admission  - Neoplasm/malignancy associated pain  - Hypoalbuminemia (present on admission)  - HYPOnatremia (present on admission)  - Fluid status: Volume overload on admission  - Cachexia  - Acute liver failure, present on admission    Code Status: FULL    I spent a total time of 60 minutes in this episode of care preparing to see the patient, obtaining and/or reviewing separately obtained history, performing a medically appropriate examination and/or evaluation, counseling and educating patient/family/caregiver, ordering and reviewing medications, tests, or procedures, referring and communicating with other health care professionals, documenting clinical information in the electronic or other health record, and communicating results to the patient/family/caregiver, and/or other care coordination.    [name], NP  05/11/22

## 2022-05-11 NOTE — Plan of Care (Signed)
Problem: Discharge Planning - Adult  Goal: Knowledge of and participation in plan of care  Outcome: Progress within 12 hours     Problem: Delirium - Adult / Pediatric  Goal: Absence or resolution of delirium  Outcome: Progress within 12 hours     Problem: Nutrition, Alteration in -Adult  Goal: Adequate nutritional intake  Outcome: Progress within 12 hours  Goal: Maximize nutritional intake per patient condition  Outcome: Progress within 12 hours     Problem: Pressure Injury,At Risk and Actual- Adult  Goal: Absence of any new pressure injury  Outcome: Progress within 12 hours  Goal: Pressure injury healing/stabilization  Outcome: Progress within 12 hours  Goal: Participation in preventative efforts and treatment plan (Patient/Family/Caregiver)  Outcome: Progress within 12 hours  Goal: Adequate nutritional intake  Outcome: Progress within 12 hours     Problem: GI Elimination Impaired - Adult  Goal: Passage of soft, formed stool  Outcome: Progress within 12 hours  Goal: Stool elimination per clinical condition ( e.g. GI ostomies )  Outcome: Progress within 12 hours

## 2022-05-12 LAB — BASIC METABOLIC PANEL (NA, K, CL, CO2, BUN, CR, GLU, CA)
Anion Gap: 8 (ref 4–14)
Calcium, total, Serum / Plasma: 8.3 mg/dL — ABNORMAL LOW (ref 8.4–10.5)
Carbon Dioxide, Total: 29 mmol/L (ref 22–29)
Chloride, Serum / Plasma: 101 mmol/L (ref 101–110)
Creatinine: 0.66 mg/dL (ref 0.55–1.02)
Glucose, non-fasting: 114 mg/dL (ref 70–199)
Potassium, Serum / Plasma: 4.4 mmol/L (ref 3.5–5.0)
Urea Nitrogen, Serum / Plasma: 27 mg/dL — ABNORMAL HIGH (ref 7–25)

## 2022-05-12 LAB — COMPLETE BLOOD COUNT WITH DIFFERENTIAL
Abs Basophils: 0.01 10*9/L (ref 0.00–0.10)
Abs Eosinophils: 0.05 10*9/L (ref 0.00–0.40)
Abs Imm Granulocytes: 0.06 10*9/L (ref <0.10)
Abs Lymphocytes: 0.68 10*9/L — ABNORMAL LOW (ref 1.00–3.40)
Hematocrit: 25.9 % — ABNORMAL LOW (ref 36.0–46.0)
Hemoglobin: 7.9 g/dL — ABNORMAL LOW (ref 12.0–15.5)
MCH: 33.3 pg (ref 26.0–34.0)
MCV: 109 fL — ABNORMAL HIGH (ref 80–100)
MPV: 12.9 fL — ABNORMAL HIGH (ref 9.1–12.6)
Platelet Count: 131 10*9/L — ABNORMAL LOW (ref 140–450)
RBC Count: 2.37 10*12/L — ABNORMAL LOW (ref 4.00–5.20)
RDW-CV: 17.5 % — ABNORMAL HIGH (ref 11.7–14.4)
WBC Count: 5.9 10*9/L (ref 3.4–10.0)

## 2022-05-12 LAB — CYTOLOGY, NON-GYNECOLOGIC (DELIVER SPECIMEN TO PATHOLOGY): Pathologist: 97638

## 2022-05-12 LAB — ASPARTATE TRANSAMINASE: AST: 38 U/L (ref 5–44)

## 2022-05-12 LAB — PHOSPHORUS, SERUM / PLASMA: Phosphorus, Serum / Plasma: 2.8 mg/dL (ref 2.3–4.7)

## 2022-05-12 LAB — CYTOMEGALOVIRUS DNA, QUANTITATIVE PCR, PLASMA
CMV DNA (IU/mL): 44 IU/mL — ABNORMAL HIGH (ref <30)
CMV DNA (Log IU/mL): 1.64 — ABNORMAL HIGH (ref <1.48)

## 2022-05-12 LAB — ALANINE TRANSAMINASE: Alanine transaminase: 16 U/L (ref 10–61)

## 2022-05-12 LAB — MAGNESIUM, SERUM / PLASMA: Magnesium, Serum / Plasma: 2 mg/dL (ref 1.6–2.6)

## 2022-05-12 LAB — ALKALINE PHOSPHATASE: Alkaline Phosphatase: 198 U/L — ABNORMAL HIGH (ref 38–108)

## 2022-05-12 LAB — BILIRUBIN, TOTAL: Bilirubin, Total: 0.6 mg/dL (ref 0.2–1.2)

## 2022-05-12 MED ORDER — SIMETHICONE 40 MG/0.6 ML ORAL DROPS,SUSPENSION
40 mg/0.6 mL | Freq: Four times a day (QID) | ORAL | Status: DC
Start: 2022-05-12 — End: 2022-05-21
  Administered 2022-05-12 – 2022-05-13 (×2): via ORAL
  Administered 2022-05-13: 23:00:00 80 mg via ORAL
  Administered 2022-05-13: 03:00:00 via ORAL
  Administered 2022-05-13: 17:00:00 80 mg via ORAL
  Administered 2022-05-14 – 2022-05-15 (×7): via ORAL
  Administered 2022-05-16 (×2): 80 mg via ORAL
  Administered 2022-05-16 – 2022-05-20 (×14): via ORAL
  Administered 2022-05-20 (×2): 80 mg via ORAL
  Administered 2022-05-20 (×2): via ORAL
  Administered 2022-05-21: 19:00:00 80 mg via ORAL
  Administered 2022-05-21 (×2): via ORAL

## 2022-05-12 MED ORDER — HYDROMORPHONE 2 MG TABLET
2 | ORAL | Status: DC | PRN
Start: 2022-05-12 — End: 2022-05-27
  Administered 2022-05-13 – 2022-05-20 (×6): 1 mg via ORAL

## 2022-05-12 MED FILL — HYDROCODONE 5 MG-ACETAMINOPHEN 325 MG TABLET: 5-325 mg | ORAL | Qty: 2

## 2022-05-12 MED FILL — XIFAXAN 550 MG TABLET: 550 mg | ORAL | Qty: 1

## 2022-05-12 MED FILL — ONDANSETRON HCL 4 MG TABLET: 4 mg | ORAL | Qty: 2

## 2022-05-12 MED FILL — ANTI-DIARRHEAL (LOPERAMIDE) 2 MG TABLET: 2 mg | ORAL | Qty: 2

## 2022-05-12 MED FILL — HYDROXYZINE HCL 25 MG TABLET: 25 mg | ORAL | Qty: 1

## 2022-05-12 MED FILL — PROCHLORPERAZINE MALEATE 5 MG TABLET: 5 mg | ORAL | Qty: 1

## 2022-05-12 MED FILL — FUROSEMIDE 20 MG TABLET: 20 mg | ORAL | Qty: 2

## 2022-05-12 MED FILL — LANSOPRAZOLE 3 MG/ML ORAL SUSPENSION COMPOUNDING KIT: 3 mg/mL | ORAL | Qty: 10

## 2022-05-12 MED FILL — PREVYMIS 480 MG TABLET: 480 mg | ORAL | Qty: 1

## 2022-05-12 MED FILL — DILAUDID (PF) 0.5 MG/0.5 ML INJECTION SYRINGE: 0.5 mg/ mL | INTRAMUSCULAR | Qty: 0.5

## 2022-05-12 MED FILL — THIAMINE MONONITRATE (VITAMIN B1) 100 MG TABLET: 100 mg | ORAL | Qty: 1

## 2022-05-12 MED FILL — URSODIOL (BULK) 100 % POWDER: 100 % | Qty: 0.6

## 2022-05-12 MED FILL — CHOLECALCIFEROL (VITAMIN D3) 25 MCG (1,000 UNIT) TABLET: 1000 UNITS | ORAL | Qty: 2

## 2022-05-12 MED FILL — GAS RELIEF 80 (SIMETHICONE) 80 MG CHEWABLE TABLET: 80 mg | ORAL | Qty: 1

## 2022-05-12 MED FILL — MAG-AL PLUS 200 MG-200 MG-20 MG/5 ML ORAL SUSPENSION: 200-200-20 mg/5 mL | ORAL | Qty: 30

## 2022-05-12 MED FILL — MIRTAZAPINE 30 MG TABLET: 30 mg | ORAL | Qty: 1

## 2022-05-12 MED FILL — THERA M PLUS (FERROUS FUMARATE) 9 MG IRON-400 MCG TABLET: 9 mg iron-400 mcg | ORAL | Qty: 1

## 2022-05-12 MED FILL — SIMETHICONE 40 MG/0.6 ML ORAL DROPS,SUSPENSION: 40 mg/0.6 mL | ORAL | Qty: 1.2

## 2022-05-12 MED FILL — SPIRONOLACTONE 100 MG TABLET: 100 mg | ORAL | Qty: 1

## 2022-05-12 MED FILL — FOLIC ACID 1 MG TABLET: 1 mg | ORAL | Qty: 1

## 2022-05-12 MED FILL — URSODIOL 60 MG/ML ORAL SYRUP: 60 mg/mL | ORAL | Qty: 10

## 2022-05-12 NOTE — Plan of Care (Signed)
Delirious in AM. Thinks she is at her daughters house. However, when asked orientation questions. Patient was able to answer questions.    In bed in AM. Patient observably sad. Patient work with occupational therapy in AM. Stayed in wheelchair until patient requested to be helped back to bed. Estimated length of stay in wheelchair, 30 mins.      Problem: Discharge Planning - Adult  Goal: Knowledge of and participation in plan of care  Outcome: Progress within 12 hours     Problem: Fall, at Risk or Actual - Adult  Goal: Absence of falls and fall related injury  Outcome: Progress within 12 hours     Problem: Delirium - Adult / Pediatric  Goal: Absence or resolution of delirium  Outcome: Progress within 12 hours     Problem: Nutrition, Alteration in -Adult  Goal: Adequate nutritional intake  Outcome: Progress within 12 hours  Goal: Maximize nutritional intake per patient condition  Outcome: Progress within 12 hours     Problem: Pressure Injury,At Risk and Actual- Adult  Goal: Absence of any new pressure injury  Outcome: Progress within 12 hours  Goal: Pressure injury healing/stabilization  Outcome: Progress within 12 hours  Goal: Participation in preventative efforts and treatment plan (Patient/Family/Caregiver)  Outcome: Progress within 12 hours  Goal: Adequate nutritional intake  Outcome: Progress within 12 hours     Problem: GI Elimination Impaired - Adult  Goal: Passage of soft, formed stool  Outcome: Progress within 12 hours  Goal: Stool elimination per clinical condition ( e.g. GI ostomies )  Outcome: Progress within 12 hours

## 2022-05-12 NOTE — Consults (Signed)
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 Pt making good progress with daily time OOB in chair and tolerated sitting for over 2 hours. Pt demosntrated improved head and trunk control in w/c however still very weak requiring mod A to anterior weight shift, reach and grasp objects at sink. Pt continues to p/w impaired executive functioning, impaired memory and recall. Pt requires max A for ADls but making good gains in the last two weeks. Recommend placement.  Progress with OT: Good progress  Current maximal level of assist: Maximal assist  Next session focus:      Recommendations    Recommended Discharge Disposition Placement for continued therapy   Discharge DME Recommendations Defer to next level of care   Equipment Recommendations     DME Comment     Discharge Recommendations Comment     Rehab Potential Patient participates well in therapy and is progressing towards goal   Anticipated Assistance Available at Discharge Family;Spouse/Significant other   Anticipated Type of Assistance Available at Discharge Assist as needed   Anticipated Time of Assistance Available at Discharge Unknown at this time   Barriers to Discharge Medical issues   Patient's current functional ability appropriate for D/C recommendations Yes     Inpatient Recommendations  OT Inpatient Recommendations Bed in chair position;Encourage participation in ADLs;Delirium precautions;Out of bed for meals and ADLs;Frequent turns   Recommendation Comments Hoyer lift to high back chair   Current Maximal Level of Assist Needed Maximal assist     Brace/Precautions   Brace/Orthotic/Prosthetic:   Precautions:Has weight bearing limitation or precaution: Yes  Precautions and weight  bearing status comments: Falls, delirium, skin, aspiration, contact     Subjective Report:"I am having a crying moment"    Patient/Family Goal:     Objective Findings and Interventions    Areas of Occupation    Grooming and Light Hygiene Minimal assistance   Cueing Required     Comment min A for anterior weight shifting, max encouragement. Pt brushed teeth, wash hands and face.   Self Feeding     Cueing Required     Comment     Toileting Minimal assistance   Cueing Required     Toileting Clothing Management Maximal assistance   Cueing Required     Comment min A rolling, max A pericare.   Upper Body Dressing Moderate assistance   Cueing Required     Comment doff and don gown   Lower Body Dressing     Cueing Required     Comment     Upper Body Bathing     Cueing Required     Comment     Lower Body Bathing     Cueing Required     Comment     ADL/IADL Comment         Functional Transfers    Bed Mobility From Supine;Sidelying   Bed Mobility To Sidelying;Supine   Level of Assist Minimal assistance   Cueing Required     Transfer From Bed   Transfer To Wheelchair   Level of Assist Dependent   Cueing Required     Technique     Functional Transfer Comment Hoyer lift from bed to w/c. Pt wheeled to sink  to engage in grooming. Pt requires mod A for anterior weight shifting and reaching at sink. Pt wheeled around floor with max A. Pt enjoyed feeling outside air in stairwell door. Pt tearful when leaving but agreeable to next session.     Functional Balance for ADLs  Position Static Dynamic   Sit  -     Dynamic sitting level of assist: Moderate assist-        Stand  -      -            [employer] [facility]	[place] AM-PAC for ADLs  6 Click Score: 14    Client Factors  Cognitive deficits noted in the following area(s):: Attention, Memory, Judgement/Safety  Cognition comment:     Problem solving;Safety awareness;Information processing;Problem ID;Initiation;Organization;Sequencing;Self-regulation;Working Chief of Staff functions comment:                         Roma Schanz, OT  05/12/2022 12:18 PM

## 2022-05-12 NOTE — Plan of Care (Signed)
Problem: Discharge Planning - Adult  Goal: Knowledge of and participation in plan of care  Outcome: Progress within 12 hours     Problem: Fall, at Risk or Actual - Adult  Goal: Absence of falls and fall related injury  Outcome: Progress within 12 hours     Problem: Delirium - Adult / Pediatric  Goal: Absence or resolution of delirium  Outcome: Progress within 12 hours     Problem: Nutrition, Alteration in -Adult  Goal: Adequate nutritional intake  Outcome: Progress within 12 hours  Goal: Maximize nutritional intake per patient condition  Outcome: Progress within 12 hours     Problem: Pressure Injury,At Risk and Actual- Adult  Goal: Absence of any new pressure injury  Outcome: Progress within 12 hours  Goal: Pressure injury healing/stabilization  Outcome: Progress within 12 hours  Goal: Participation in preventative efforts and treatment plan (Patient/Family/Caregiver)  Outcome: Progress within 12 hours  Goal: Adequate nutritional intake  Outcome: Progress within 12 hours     Problem: GI Elimination Impaired - Adult  Goal: Passage of soft, formed stool  Outcome: Progress within 12 hours  Goal: Stool elimination per clinical condition ( e.g. GI ostomies )  Outcome: Progress within 12 hours

## 2022-05-12 NOTE — Progress Notes (Addendum)
[facility] PROGRESS NOTE     This is an independent service.   The available consultant for this service is Alena Bills, MD.           My date of service is 05/12/22.     24 Hour Course  - scheduled simethicone for gas  - furosemide + spironolactone held x 1 due to soft BP  - hydromorphone IV switched to G-tube due to disorientation  - Weight obtained via life scale 67.3 kg  - CMV level 44 - c/w ongoing control  - Peritoneal fluid studies unrevealing for malignancy, infection    Subjective  Sitting up in bed - tearful - feels lonely.  Inquiring about her Daughter.  Intermittently has dreams where she gets confused where she is but otherwise able to re-orient and answers all her orientation questions appropriately.  Her biggest issue today is ongoing gas pain and cramping.  She has no other complaints.      Vitals  Temp:  [36.8 C (98.2 F)-37.8 C (100.1 F)] 37.8 C (100.1 F)  Heart Rate:  [79-86] 86  *Resp:  [17-18] 18  BP: (84-99)/(60-69) 96/66  SpO2:  [95 %-99 %] 95 %    MostRecent Weight: 67.3 kg (148 lb 5.9 oz)  Admission Weight: 66.2 kg (146 lb)      Intake/Output Summary (Last 24 hours) at 05/12/2022 1624  Last data filed at 05/12/2022 1500  Gross per 24 hour   Intake 1315 ml   Output 900 ml   Net 415 ml       Physical Exam  Gen: NAD, appropriate and engaged  HEENT: MMM, no oral lesions, sclera non-icteric  Resp: normal work of breathing, CTAB, no rhonchi/wheezing  Card: RRR  Gi: distended, non-tender  Ext: negative BLE edema   Skin: scattered ecchymosis to BUE  Neuro: Alert, oriented x3, no focal neuro deficits  Lines: PIV x1 in place, clean dry & intact dressing    Scheduled Meds:   0.9% sodium chloride flush  3 mL Intravenous Q12H [place]    cholecalciferol (vitamin D3)  2,000 Units Per G Tube Daily [place]    cyanocobalamin (Vitamin B12)  1,000 mcg Subcutaneous Q7 Days    ergocalciferol (vitamin D2)  50,000 Units Per G Tube Q7 Days    folic acid  1 mg Per G Tube Daily [place]    furosemide  40  mg Per G Tube Daily [place]    lansoprazole  30 mg Per G Tube Q AM Before Breakfast [facility]    letermovir  480 mg Other Daily [place]    loperamide  4 mg Feeding Tube Q12H [place]    mirtazapine  30 mg Per G Tube Daily At Bedtime [facility]    multivitamin with iron-minerals-folic acid  1 tablet Feeding Tube Daily SCH    ondansetron  8 mg Per G Tube TID    rifAXIMin  550 mg Feeding Tube BID SCH    simethicone  80 mg Oral 4x Daily [place]    spironolactone  100 mg Per G Tube Daily [place]    thiamine mononitrate (vit B1)  100 mg Per G Tube Daily [place]    ursodiol  600 mg Per G Tube BID [place]     Continuous Infusions:  PRN Meds:   0.9% sodium chloride flush  3 mL Intravenous PRN    acetaminophen  650 mg Per G Tube Q8H PRN    albuterol  2.5 mg Nebulization Q4H PRN    Or  albuterol  2.5 mg Nebulization Q4H PRN    aluminum-magnesium hydroxide-simethicone  15 mL Per G Tube Q4H PRN    HYDROcodone-acetaminophen  1-2 tablet Per G Tube Q6H PRN    HYDROmorphone  1 mg Oral Q4H PRN    hydrOXYzine  25 mg Feeding Tube Q4H PRN    opium  6 mg Oral 4x Daily PRN    prochlorperazine  5 mg Intravenous Q8H PRN    Or    prochlorperazine  5 mg Per G Tube Q8H PRN       Data    CBC        05/12/22  0617 05/11/22  0620   WBC 5.9 6.9   HGB 7.9* 7.8*   HCT 25.9* 25.7*   PLT 131* 125*       Coags  No results found in last 36 hours      Chem7        05/12/22  0617 05/11/22  0620   NA 138 138   K 4.4 4.0   CL 101 100*   CO2 29 30*   BUN 27* 29*   CREAT 0.66 0.67   GLU 114 119       Electrolytes        05/12/22  0617 05/11/22  0620   CA 8.3* 7.9*   MG 2.0 2.0   PO4 2.8 2.8       Liver Panel        05/12/22  0617 05/11/22  0620   AST 38 47*   ALT 16 20   ALKP 198* 246*   TBILI 0.6 0.6       UA  No results found in last 7 days    Microscopy:  No results found in last 7 days    Microbiology Results (last 72 hours)       Procedure Component Value Units Date/Time    Cytomegalovirus DNA, Quantitative PCR, Plasma [[id number]]  (Abnormal) Collected: 05/11/22 0620    Order Status: Completed  Specimen: Blood from Serum Updated: 05/12/22 1514     CMV DNA (IU/mL) 44 IU/mL      Comment: *SUBCRITICAL*  Performed by Real-Time PCR  Note new reference range.          CMV DNA (Log IU/mL) 1.64     Comment: Log10 IU/mL  Note new reference range.         Bacterial Culture, Normally Sterile Sites, With Gram Stain [[id number]] Collected: 05/11/22 1115    Order Status: Completed Specimen: Not Applicable from Peritoneal Fluid Updated: 05/12/22 1114     Gram Stain Rare Host cells (not PMNs, RBCs, or squamous epithelial cells) , No PMNs seen      No organisms seen     Bacterial Culture, Sterile Sites No growth at 19 hours.          Radiology Results  No results found.    I discussed the patient with Johnney Ou* from [facility] regarding A&P.    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  2011: VRd bortezomib, Lenalidomide, Dexamethasone  04/2011: Auto SCT  07/2014: Rd - revlimid 15 mg every other day, dexamethasone 8 mg PO weekly.   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   Receiving dexamethasone 12 mg weekly for diarrhea as outpt  10/16/21 - last Daratumumab dose (on hold due to diarrhea)     DATA:   09/07/2010: IgG Kappa MM  12/02/2021: Ig M <5, IgG 447, IgA 20, Ig E <2, SFLC Kappa 16.6, Lambda 1.6, Kappa/Lamda 10.38  12/14/21: IgG K on IFE, IgG 430, KLC 16.6, LLC 2.7, KLR 6.15  02/23/22 SPEP with stable M-spike, SFLC decreased Kappa 44.2 / Lambda 5.5 = K/L ratio = 8.04 (overall downtrending still)  04/23/22 M-spike 0.6, IgG K on IFE, IgG 997, Kappa 70.7/Lambda 19.8 = K/L ratio = 3.57  Monitor MM labs monthly per Dr Jacelyn Grip, next due 7/9     Chemo:  TBD, therapy held  since 10/2021 due to recurrent infections and now deconditioning  Not currently a candidate due to significant deconditioning, may be candidate in the future if improved.     Supportive Care:   Transfuse for Hgb <7 and Platelets <10  Access: PIV     Primary Oncologist:   Dr. Jacelyn Grip ([place]), will transfer to Dr. Hassell Done ([place])  Dr. Iona Coach ([place])     Disposition:  Lives in [place] - anticipate discharge to SNF, currently working with PT  - Patient centered rounds: 6/20, awaiting SNF placement     # Immunocompromised  # Diarrhea - controlled  # c/b CMV Viremia  # Hx prolonged norovirus infection  Immunocompromised due to underlying  malignancy, not currently neutropenic. Recent extended hospitalization at both [facility] 1/23-04/16/22, with hypovolemic shock associated with norovirus, CMV colitis, acute liver failure associated with pomylast (see ALF section).  Currently admitted for ongoing diarrhea and FTT in brief time at home after dc from [facility], initially began in 8/22, recently managed at home w/ loperamide BID w/ 2-3 watery stools daily. Diarrhea worsened on 6/12 (11 stools). Possibly r/t to inadequate dosing of loperamide vs infectious etiology. GI c/s 6/13 for possible scope - per patient and family declined endoscopy at this time. ID consultation deferred as infectious w/u negative.   Given CMV improving, norovirus negative, and enteric panel unrevealing - favor non-infectious etiologies for diarrhea. ID has requested we maintain enteric precautions for hospitalization d/t prolonged prior GI infections.   Diarrhea controlled on current regimen to 1-2 stools/day as of 6/27.     Data  - Norovirus  - First +norovirus 08/2021 with persistent +norovirus s/p 7-day courses x 2 nitazoxanide (2/16-02/22/23 & 01/2022). Continued to be norovirus positive (most recently on 04/07/22). Norovirus, cdiff, GI PCR neg this admission (6/9).   - CMV  -Regarding CMV colitis, 12/17/21 flex sig showed CMV colitis, sp multiple cycles of  gancyclovir IV, IVIG, valcyte 900 mg PO BID, then ultimately transitioned to letermovir, which she continues. 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   #  Hepatic Encephalopathy  Steady rise in LFTs began 05/2021. Acute rise in 11/2021,coinciding with acute CMV viremia but liver biopsy CMV stains negative (12/11/21). 12/14/21: Hepatitis/viral work-up negative in 11/2021. LP was done on 12/16/21 for hepatic encephalopathy w/ CMV CSF PCR negative. Suspected to be 2/2 pomalidamide, though liver path from 12/22/21 unlikely for vanishing bile duct syndrome.   Malnutrition may also be contributing to ascites (see below). Upon admission was more somnolent and one episode of possible visual hallucination reported on 6/23. Her mentation has improved throughout this hospitalization  As of 6/27 AxOx3    Dx:  - 6/17 KUB: mild ileus and density over abdomen (likely ascitic fluid) - not clinically significant as patient is having regular bowel movements and no PVR noted.  - 6/20 therapeutic paracentesis by TAPS, -4L + albumin  - 6/23 ammonia normal  - 6/27 paracentesis by TAPS -5L + albumin, SBP unrevealing or infection. cytology negative for malignancy    Plan:  -Continue spironolactone ('100mg'$ ) + lasix ('40mg'$ ) to minimize accumulation of ascites   (started 6/16, increased 6/19- )  Hold for SBP<90 or MAP<60  Will likely need ~q weekly therapeutic paracentesis inpt/outpt - will need to be  arranged for d/c   -Continue Ursodiol 600 mg PO BID  -Continue rifaximin 550 mg PO BID  -Low sodium diet  -2L fluid restriction  -Delirium precautions     # Failure to thrive  # PEG tube dependence  # Macrocytic anemia - B12 deficiency  PEG tube placed during last admission. Low B12 level but MMA normal, which is not consistent with B12 deficiency, however still treating at this time as below. Encouraged PO intake in addition to tube feeds, pt amenable on 6/22  6/44 - tolerated cyclic TF and working on oral intake    Plan:  -Cyclic TF - Nutren 1.5 - 75 mL x 16 hours (1800-1000) - TID residuals per RN protocol  -Continue Thera-M Plus MVI for copper deficiency (Copper = 47 on 6/16)  -Continue mirtazapine  '30mg'$  nightly (incr 6/18- ) per palliative recs  -Continue cyanocobalamin 1046mg SQ weekly (6/24- )   -s/p Cyanocobalamin 10032m SQ daily x7 days (6/17-6/23)  -s/p Zinc acetate 6/11-6/23)    # Dysphagia  Dysphagia and aspiration risk assessed by speech language pathology. SLP recommended Modified Barium Swallow (completed on 6/21) which showed moderate pharyngeal aspiration; SLP recommends regular diet w/ mildly thickened liquids.   6/22 - Pt and daughter request to thicken liquids themselves at bedside for more variety, understand risk of aspiration w/ thins. Liberalized diet per their request to regular.   Appreciate SLP recs.   - See failure to thrive section as well re: PEG tube and nutritional deficiencies     # Pain  # Pressure Injury (sacrum)  Pain primarily r/t pressure injuries though occasionally also d/t abdominal cramping. Wound healing likely limited d/t poor nutritional status (see above) and limited mobility (improving).  -Consider wound care RN c/s  -Consulted Palliative Care 6/17 for SMS, now s/o  -Continue hydrocodone-acetaminophen PRN  -Continue hydromorphone PRN     # Physical Deconditioning  Pt motivated to participate w/ PT/OT, deconditioned after prior extended hospitalization (as above)  -  Continue PT/OT   - recommend placement SNF    #GOC  Pt is consistent and clear that she wishes to pursue full medical management of her ongoing medical problems, as above, with the goal of prolonging her life. She is motivated by her desire to spend time with her family. This  is consistent in conversations with [facility] (6/18) & Dr. Jacelyn Grip (6/21).     VTE PPx:  Contraindicated: Anticipated procedure    Severity of Illness  Stable    Nutritional Assessment  Severe Chronic disease or condition related malnutrition    During this hospitalization the patient is also being treated for:  - Anemia associated with malignancy (present on admission)    - Thrombocytopenia (present on admission)    - Coagulopathy (present on admission)    - Malignancy associated fatigue on admission  - Neoplasm/malignancy associated pain  - Hypoalbuminemia (present on admission)  - HYPOnatremia (present on admission)  - Fluid status: Volume overload on admission  - Cachexia  - Acute liver failure, present on admission    Code Status: FULL    I spent a total time of 60 minutes in this episode of care preparing to see the patient, obtaining and/or reviewing separately obtained history, performing a medically appropriate examination and/or evaluation, counseling and educating patient/family/caregiver, ordering and reviewing medications, tests, or procedures, referring and communicating with other health care professionals, documenting clinical information in the electronic or other health record, and communicating results to the patient/family/caregiver, and/or other care coordination.    Gardiner Rhyme, NP  Hospital Identification #:  [id number]

## 2022-05-12 NOTE — Interdisciplinary (Signed)
Delivered medium; bilateral multipodus boot(s) to prevent plantarflexion contractures and heel ulceration while the patient is in bed. Further instruction to be delivered by medical team.

## 2022-05-13 LAB — MAGNESIUM, SERUM / PLASMA: Magnesium, Serum / Plasma: 2 mg/dL (ref 1.6–2.6)

## 2022-05-13 LAB — BASIC METABOLIC PANEL (NA, K, CL, CO2, BUN, CR, GLU, CA)
Anion Gap: 9 (ref 4–14)
Calcium, total, Serum / Plasma: 8 mg/dL — ABNORMAL LOW (ref 8.4–10.5)
Carbon Dioxide, Total: 26 mmol/L (ref 22–29)
Chloride, Serum / Plasma: 102 mmol/L (ref 101–110)
Creatinine: 0.63 mg/dL (ref 0.55–1.02)
Glucose, non-fasting: 111 mg/dL (ref 70–199)
Potassium, Serum / Plasma: 4 mmol/L (ref 3.5–5.0)
Sodium, Serum / Plasma: 137 mmol/L (ref 135–145)
Urea Nitrogen, Serum / Plasma: 27 mg/dL — ABNORMAL HIGH (ref 7–25)
eGFRcr: 100 mL/min/{1.73_m2} (ref >59)

## 2022-05-13 LAB — COMPLETE BLOOD COUNT WITH DIFFERENTIAL
Abs Basophils: 0.02 10*9/L (ref 0.00–0.10)
Abs Eosinophils: 0.07 10*9/L (ref 0.00–0.40)
Abs Imm Granulocytes: 0.06 10*9/L (ref <0.10)
Abs Lymphocytes: 0.85 10*9/L — ABNORMAL LOW (ref 1.00–3.40)
Abs Monocytes: 0.66 10*9/L (ref 0.20–0.80)
Abs Neutrophils: 4.01 10*9/L (ref 1.80–6.80)
Hematocrit: 25.3 % — ABNORMAL LOW (ref 36.0–46.0)
Hemoglobin: 7.7 g/dL — ABNORMAL LOW (ref 12.0–15.5)
MCH: 32.8 pg (ref 26.0–34.0)
MCHC: 30.4 g/dL — ABNORMAL LOW (ref 31.0–36.0)
MCV: 108 fL — ABNORMAL HIGH (ref 80–100)
Platelet Count: 134 10*9/L — ABNORMAL LOW (ref 140–450)
RDW-CV: 17.4 % — ABNORMAL HIGH (ref 11.7–14.4)
WBC Count: 5.7 10*9/L (ref 3.4–10.0)

## 2022-05-13 LAB — PHOSPHORUS, SERUM / PLASMA: Phosphorus, Serum / Plasma: 3.1 mg/dL (ref 2.3–4.7)

## 2022-05-13 LAB — ALANINE TRANSAMINASE: Alanine transaminase: 16 U/L (ref 10–61)

## 2022-05-13 LAB — ASPARTATE TRANSAMINASE: AST: 39 U/L (ref 5–44)

## 2022-05-13 LAB — ALKALINE PHOSPHATASE: Alkaline Phosphatase: 185 U/L — ABNORMAL HIGH (ref 38–108)

## 2022-05-13 LAB — BILIRUBIN, TOTAL: Bilirubin, Total: 0.6 mg/dL (ref 0.2–1.2)

## 2022-05-13 MED FILL — SIMETHICONE 40 MG/0.6 ML ORAL DROPS,SUSPENSION: 40 mg/0.6 mL | ORAL | Qty: 1.2

## 2022-05-13 MED FILL — LANSOPRAZOLE 3 MG/ML ORAL SUSPENSION COMPOUNDING KIT: 3 mg/mL | ORAL | Qty: 10

## 2022-05-13 MED FILL — XIFAXAN 550 MG TABLET: 550 mg | ORAL | Qty: 1

## 2022-05-13 MED FILL — HYDROMORPHONE 2 MG TABLET: 2 mg | ORAL | Qty: 1

## 2022-05-13 MED FILL — ONDANSETRON HCL 4 MG TABLET: 4 mg | ORAL | Qty: 2

## 2022-05-13 MED FILL — FOLIC ACID 1 MG TABLET: 1 mg | ORAL | Qty: 1

## 2022-05-13 MED FILL — CHOLECALCIFEROL (VITAMIN D3) 25 MCG (1,000 UNIT) TABLET: 1000 UNITS | ORAL | Qty: 2

## 2022-05-13 MED FILL — MIRTAZAPINE 30 MG TABLET: 30 mg | ORAL | Qty: 1

## 2022-05-13 MED FILL — HYDROXYZINE HCL 25 MG TABLET: 25 mg | ORAL | Qty: 1

## 2022-05-13 MED FILL — ANTI-DIARRHEAL (LOPERAMIDE) 2 MG TABLET: 2 mg | ORAL | Qty: 2

## 2022-05-13 MED FILL — PREVYMIS 480 MG TABLET: 480 mg | ORAL | Qty: 1

## 2022-05-13 MED FILL — THERA M PLUS (FERROUS FUMARATE) 9 MG IRON-400 MCG TABLET: 9 mg iron-400 mcg | ORAL | Qty: 1

## 2022-05-13 MED FILL — THIAMINE MONONITRATE (VITAMIN B1) 100 MG TABLET: 100 mg | ORAL | Qty: 1

## 2022-05-13 MED FILL — HYDROCODONE 5 MG-ACETAMINOPHEN 325 MG TABLET: 5-325 mg | ORAL | Qty: 2

## 2022-05-13 MED FILL — SPIRONOLACTONE 100 MG TABLET: 100 mg | ORAL | Qty: 1

## 2022-05-13 MED FILL — FUROSEMIDE 20 MG TABLET: 20 mg | ORAL | Qty: 2

## 2022-05-13 NOTE — Consults (Signed)
PHYSICAL THERAPY TREATMENT NOTE    PT relevant HPI       Medical Update:        ASSESSMENT  Pt participating in PT session. Endorsing some anxiety surrounding level of challenge. Provided pt/fam edu on gradually increasing level of challenge in order to build muscle strength and activity tolerance. Pt tolerates 25 min progressive standing in combilizer with active rest breaks at 20-30 degrees and stands ranging between 40-60 degrees. Continue daily PT to optimize her functional outcomes and recovery.    Focus next session:      RECOMMENDATIONS  DISCHARGE RECOMMENDATION  Comments Placement with ongoing PT needs  SNF   Patient Current Functional Status Sufficient for PT Discharge Recommendation Yes   Discharge DME needs TBD   Discharge transportation needs [facility] Potential Patient participates well in therapy and progressing towards goals     NURSING RECOMMENDATIONS  Inpatient Rehab Assistive Device Recommendation Vertical dependent lift;Lateral transfer device   Activity Recommendation OOB to chair daily via sling lift     SUBJECTIVE  Subjective report:  Pt agreeable to participate in PT with facilitation. Endorses that she worked with OT this afternoon as well.  Notable observations:  Pt sitting up in combi at end of session with goal of 1-2 hours.    SYSTEMS REVIEW  Cognition/Communication  Delirium screen:  Yes Delirium Screen:   Details:  at risk given prolonged hospitalizations and decreased mob    Communication  Cognition/communication impaired:  Yes     Cognition comment: impaired memory  Behavior: Self-limiting, Anxious    Integumentary  Integumentary deficits:  YesIntegumentary deficit detail:  PEG    Cardiopulmonary  Cardiopulmonary deficits:  Yes  Detail:  decreased activity tolerance    Musculoskeletal  Musculoskeletal deficits:  Yes  Abnormal strength findings: B LE grossly 3-/5  Abnormal range of motion findings: gastroc tightness ankles unable to achieve neutral, shoulder flexion AAROM ~100  degrees    Neuromuscular  Neuromuscular deficits:  Yes  Motor control or coordination: impaired LEs        Pain     Currently in pain: No          COMPREHENSIVE MOVEMENT ANALYSIS/TREATMENT  Precautions/WB status: No    Hemodynamic response:   Normal hemodynamic response: Yes  Response:     Comments: VSS on RA      Functional Mobility  Requires second person/additional health care providers: Yes  Type additional health care provider: RN    Bed mobility Current Initial    Rolling  Level of assist  Minimal assist  Minimal assist (05/11/22 1331)   Device  Bed rail;Log roll  Bed rail (05/11/22 1331)   Intervention  Verbal cues;Tactile cues;External cues  Verbal cues (05/11/22 1331)   Rolling comments:     Transfer Current Initial   Sit < > Stand  Level of assist     Device     Intervention     Transfer  Level of assist  From: Bed  To: Combilizer  Dependent assist;Two person assist  Dependent assist;Two person assist (04/29/22 1209)   Device   Vertical dependent lift (04/29/22 1209)   Intervention     Sit < > Stand comments:   Transfer comments:   hovermat to combi, 25 min progressive standing in combi, warm-up and active rest at 20-30 degrees, progressive stands at 35, 40, 50, and 60 degrees, towels positioned at heels 2/2 decreased DF ROM    Balance  Balance deficits noted: Yes  Functional Balance for ADLs  Position Static Dynamic   Sit Static sitting level of assist: Minimal assist-     Dynamic sitting level of assist: Minimal assist-  Dynamic sitting comment: with UE support on tray     Stand  -      -        Exercise/other interventions  Standing exercises comment (other exercise, details): ant punches, UE press-ups, glute/quad squeezes, passive knee bends with active knee ext at 35 and 50 degrees    Communication between other health care providers: Communication between other health care provider: RN;OT  Communication comment:       Education Careers adviser: Patient  Content: Plan of care, Activity  recommendations, Discharge recommendations  Response: Needs reinforcement, Verbalizes understanding, Demonstrates understanding  Outcome measures   Physical Therapist Global Assessment of Mobility  Activity Achieved: Tilt Table positioning/neuro chair  AMPAC 6-clicks basic mobility score: 9      PLAN  Plan of care status:  Current plan of care remains appropriate  PT frequency:  5x/week  PT duration:  4 weeks.  Comment:       Planned PT interventions:   Specific interventions: Progressive functional mobility training;Balance training;NM Re-ed;Aerobic training;Ther ex  Education interventions: Fall risk reduction;Exercise program;Self-pacing/breathing;Benefits of activity;Caregiver training  Comment:          Vita Erm, PT    05/13/2022

## 2022-05-13 NOTE — Progress Notes (Signed)
[facility] PROGRESS NOTE     This is an independent service.   The available consultant for this service is Alena Bills, MD.           My date of service is 05/13/22.     24 Hour Course  - HDS, afebrile  - Episode of anxiety overnight, given atarax with relief  - Diarrhea well controlled w/ loperamide BID    Subjective  Sitting up in bed - cheerful and humorous today. Was glad to be able to get fresh air at stairwell with OT yesterday and looking forward to doing so again tomorrow. Has been able to tolerate smoothies PO but has little appetite for most foods, thinks cyclic tube feeds have helped her tolerate more PO. She feels her bowels are "perfectly balanced" & having one soft formed BM daily. No abdominal pain, no other focal complaints.     Vitals  Temp:  [36.8 C (98.2 F)-37.1 C (98.8 F)] 36.8 C (98.2 F)  Heart Rate:  [81-84] 83  *Resp:  [16-18] 16  BP: (91-101)/(66-78) 100/78  SpO2:  [95 %-100 %] 100 %    MostRecent Weight: 67.3 kg (148 lb 5.9 oz)  Admission Weight: 66.2 kg (146 lb)      Intake/Output Summary (Last 24 hours) at 05/13/2022 1023  Last data filed at 05/13/2022 2536  Gross per 24 hour   Intake 960 ml   Output 450 ml   Net 510 ml       Physical Exam  Gen: NAD, appropriate and engaged  HEENT: MMM, no oral lesions, sclera non-icteric  Resp: CTAB, no rhonchi/wheezing  Card: RRR  Gi: distended, soft, non-tender, +bs x4  Ext: negative BLE edema   Skin: scattered ecchymosis to BUE  Neuro: Alert, oriented x3, no focal neuro deficits  Lines: PIV x1 in place, clean dry & intact dressing    Scheduled Meds:   0.9% sodium chloride flush  3 mL Intravenous Q12H [place]    cholecalciferol (vitamin D3)  2,000 Units Per G Tube Daily [place]    cyanocobalamin (Vitamin B12)  1,000 mcg Subcutaneous Q7 Days    ergocalciferol (vitamin D2)  50,000 Units Per G Tube Q7 Days    folic acid  1 mg Per G Tube Daily [place]    furosemide  40 mg Per G Tube Daily [place]    lansoprazole  30 mg Per G Tube Q AM Before  Breakfast [facility]    letermovir  480 mg Other Daily [place]    loperamide  4 mg Feeding Tube Q12H [place]    mirtazapine  30 mg Per G Tube Daily At Bedtime [facility]    multivitamin with iron-minerals-folic acid  1 tablet Feeding Tube Daily SCH    ondansetron  8 mg Per G Tube TID    rifAXIMin  550 mg Feeding Tube BID SCH    simethicone  80 mg Oral 4x Daily [place]    spironolactone  100 mg Per G Tube Daily [place]    thiamine mononitrate (vit B1)  100 mg Per G Tube Daily [place]    ursodiol  600 mg Per G Tube BID [place]     Continuous Infusions:  PRN Meds:   0.9% sodium chloride flush  3 mL Intravenous PRN    acetaminophen  650 mg Per G Tube Q8H PRN    albuterol  2.5 mg Nebulization Q4H PRN    Or    albuterol  2.5 mg Nebulization Q4H PRN    aluminum-magnesium  hydroxide-simethicone  15 mL Per G Tube Q4H PRN    HYDROcodone-acetaminophen  1-2 tablet Per G Tube Q6H PRN    HYDROmorphone  1 mg Oral Q4H PRN    hydrOXYzine  25 mg Feeding Tube Q4H PRN    opium  6 mg Oral 4x Daily PRN    prochlorperazine  5 mg Intravenous Q8H PRN    Or    prochlorperazine  5 mg Per G Tube Q8H PRN       Data    CBC        05/13/22  0543 05/12/22  0617   WBC 5.7 5.9   HGB 7.7* 7.9*   HCT 25.3* 25.9*   PLT 134* 131*       Coags  No results found in last 36 hours      Chem7        05/13/22  0543 05/12/22  0617   NA 137 138   K 4.0 4.4   CL 102 101   CO2 26 29   BUN 27* 27*   CREAT 0.63 0.66   GLU 111 114       Electrolytes        05/13/22  0543 05/12/22  0617   CA 8.0* 8.3*   MG 2.0 2.0   PO4 3.1 2.8       Liver Panel        05/13/22  0543 05/12/22  0617   AST 39 38   ALT 16 16   ALKP 185* 198*   TBILI 0.6 0.6       UA  No results found in last 7 days    Microscopy:  No results found in last 7 days    Microbiology Results (last 72 hours)       Procedure Component Value Units Date/Time    Bacterial Culture, Normally Sterile Sites, With Gram Stain [[id number]] Collected: 05/11/22 1115    Order Status: Completed Specimen: Not Applicable from Peritoneal Fluid Updated: 05/13/22 0826      Gram Stain Rare Host cells (not PMNs, RBCs, or squamous epithelial cells) , No PMNs seen      No organisms seen     Bacterial Culture, Sterile Sites No growth 2 days.    Cytomegalovirus DNA, Quantitative PCR, Plasma [[id number]]  (Abnormal) Collected: 05/11/22 0620    Order Status: Completed Specimen: Blood from Serum Updated: 05/12/22 1514     CMV DNA (IU/mL) 44 IU/mL      Comment: *SUBCRITICAL*  Performed by Real-Time PCR  Note new reference range.          CMV DNA (Log IU/mL) 1.64     Comment: Log10 IU/mL  Note new reference range.               Radiology Results  No results found.    I discussed the patient with Johnney Ou* from [facility] regarding A&P.    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  2011: VRd bortezomib, Lenalidomide, Dexamethasone  04/2011: Auto SCT  07/2014: Rd - revlimid 15 mg every other day, dexamethasone 8 mg PO weekly.   04/2019 due to progression of M spike Revlimid was increased to 15 mg PO D1-21 every 28 days, plus dexamethasone  05/2020: [facility]ara-Pd -Daratumumab +  Pomylast 2 mg D 1-21, Q28 days, dexamethasone 12 mg PO Q weekly.   Receiving dexamethasone 12 mg weekly for diarrhea as outpt  10/16/21 - last Daratumumab dose (on hold due to diarrhea)     DATA:   09/07/2010: IgG Kappa MM  12/02/2021: Ig M <5, IgG 447, IgA 20, Ig E <2, SFLC Kappa 16.6, Lambda 1.6, Kappa/Lamda 10.38  12/14/21: IgG K on IFE, IgG 430, KLC 16.6, LLC 2.7, KLR 6.15  02/23/22 SPEP with stable M-spike, SFLC decreased Kappa 44.2 / Lambda 5.5 = K/L ratio = 8.04 (overall downtrending still)  04/23/22 M-spike 0.6, IgG K on IFE, IgG 997, Kappa 70.7/Lambda 19.8 = K/L ratio = 3.57  Monitor MM labs monthly per Dr Jacelyn Grip, next due 7/9     Chemo:  TBD, therapy held since 10/2021 due to recurrent  infections and now deconditioning  Not currently a candidate due to significant deconditioning, may be candidate in the future if improved.     Supportive Care:   Transfuse for Hgb <7 and Platelets <10  Access: PIV     Primary Oncologist:   Dr. Jacelyn Grip ([place]), will transfer to Dr. Hassell Done ([place])  Dr. Iona Coach ([place])     Disposition:  Lives in [place] - anticipate discharge to SNF, currently working with PT/OT  - Patient centered rounds: 6/20, awaiting SNF placement     # Immunocompromised  # Diarrhea - controlled  # c/b CMV Viremia  # Hx prolonged norovirus infection  Immunocompromised due to underlying  malignancy, not currently neutropenic. Recent extended hospitalization at both [facility] [place] 1/23-04/16/22, with hypovolemic shock associated with norovirus, CMV colitis, acute liver failure associated with pomylast (see ALF section).  Currently admitted for ongoing diarrhea and FTT in brief time at home after dc from [facility], initially began in 8/22, recently managed at home w/ loperamide BID w/ 2-3 watery stools daily. Diarrhea worsened on 6/12 (11 stools). Possibly r/t to inadequate dosing of loperamide vs infectious etiology. GI c/s 6/13 for possible scope - per patient and family declined endoscopy at this time. ID consultation deferred as infectious w/u negative.   Given CMV improving, norovirus negative, and enteric panel unrevealing - favor non-infectious etiologies for diarrhea. ID has requested we maintain enteric precautions for hospitalization d/t prolonged prior GI infections.   Diarrhea controlled on current regimen to 1-2 stools/day as of 6/27.     Data  - Norovirus  - First +norovirus 08/2021 with persistent +norovirus s/p 7-day courses x 2 nitazoxanide (2/16-02/22/23 & 01/2022). Continued to be norovirus positive (most recently on 04/07/22). Norovirus, cdiff, GI PCR neg this admission (6/9).   - CMV  -Regarding CMV colitis, 12/17/21 flex sig showed CMV colitis, sp multiple cycles of gancyclovir IV, IVIG, valcyte  900 mg PO BID, then ultimately transitioned to letermovir, which she continues. 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   # Hepatic  Encephalopathy  Steady rise in LFTs began 05/2021. Acute rise in 11/2021,coinciding with acute CMV viremia but liver biopsy CMV stains negative (12/11/21). 12/14/21: Hepatitis/viral work-up negative in 11/2021. LP was done on 12/16/21 for hepatic encephalopathy w/ CMV CSF PCR negative. Suspected to be 2/2 pomalidamide, though liver path from 12/22/21 unlikely for vanishing bile duct syndrome.   Malnutrition may also be contributing to ascites (see below). Upon admission was more somnolent and one episode of possible visual hallucination reported on 6/23. Her mentation has improved throughout this hospitalization  As of 6/29 AxOx3    Dx:  - 6/17 KUB: mild ileus and density over abdomen (likely ascitic fluid) - not clinically significant as patient is having regular bowel movements and no PVR noted.  - 6/20 therapeutic paracentesis by TAPS, -4L + albumin  - 6/23 ammonia normal  - 6/27 paracentesis by TAPS -5L + albumin, SBP unrevealing or infection. cytology negative for malignancy    Plan:  -Continue spironolactone ('100mg'$ ) + lasix ('40mg'$ ) to minimize accumulation of ascites   (started 6/16, increased 6/19- )  Hold for SBP<90 or MAP<60  Will likely need ~q weekly therapeutic paracentesis inpt/outpt - will need to be arranged for d/c    -Continue Ursodiol 600 mg PO BID  -Continue rifaximin 550 mg PO BID  -Low sodium diet  -2L fluid restriction  -Delirium precautions     # Failure to thrive  # PEG tube dependence  # Macrocytic anemia - B12 deficiency  PEG tube placed during last admission. Low B12 level but MMA normal, which is not consistent with B12 deficiency, however still treating at this time as below. Encouraged PO intake in addition to tube feeds. Transitioned to cyclic tube feeds on 8/67.  6/29 - tolerating cyclic TF and improving oral intake    Plan:  -Continue cyclic TF - Nutren 1.5 @ 75 mL x 16 hours (1800-1000) - TID residuals per RN protocol  -Continue Thera-M Plus MVI for copper deficiency (Copper = 47 on 6/16)  -Continue mirtazapine  '30mg'$  nightly (incr 6/18- ) per palliative recs  -Continue cyanocobalamin 1021mg SQ weekly (6/24- )   -s/p Cyanocobalamin 10069m SQ daily x7 days (6/17-6/23)  -s/p Zinc acetate 6/11-6/23)    # Dysphagia  Dysphagia and aspiration risk assessed by speech language pathology. SLP recommended Modified Barium Swallow (completed on 6/21) which showed moderate pharyngeal aspiration; SLP recommends regular diet w/ mildly thickened liquids.   6/22 - Pt and daughter request to thicken liquids themselves at bedside for more variety, understand risk of aspiration w/ thins. Liberalized diet per their request to regular.   Appreciate SLP recs.   - See failure to thrive section as well re: PEG tube and nutritional deficiencies     # Pain  # Pressure Injury (sacrum)  Pain primarily r/t pressure injuries though occasionally also d/t abdominal cramping. Wound healing likely limited d/t poor nutritional status (see above) and limited mobility (improving).  -Consider wound care RN c/s  -Consulted Palliative Care 6/17 for SMS, now s/o  -Continue hydrocodone-acetaminophen PRN  -Continue hydromorphone per G tube PRN     # Physical Deconditioning  Pt motivated to participate w/ PT/OT, deconditioned after prior extended  hospitalization (as above). Showing marked improvements in endurance w/ static siting balance & bed mobility.   - Continue PT/OT   - recommend SNF placement    #GOC  Pt is consistent and clear that she wishes to pursue full medical management of her ongoing medical problems, as above, with the  goal of prolonging her life. She is motivated by her desire to spend time with her family. This is consistent in conversations with [facility] (6/18) & Dr. Jacelyn Grip (6/21).     VTE PPx:  Contraindicated: Anticipated procedure    Severity of Illness  Stable    Nutritional Assessment  Severe Chronic disease or condition related malnutrition    During this hospitalization the patient is also being treated for:  - Anemia associated with malignancy (present on admission)    - Thrombocytopenia (present on admission)    - Coagulopathy (present on admission)    - Malignancy associated fatigue on admission  - Neoplasm/malignancy associated pain  - Hypoalbuminemia (present on admission)  - HYPOnatremia (present on admission)  - Fluid status: Volume overload on admission  - Cachexia  - Acute liver failure, present on admission    Code Status: FULL        I spent a total time of 50 minutes in this episode of care preparing to see the patient, obtaining and/or reviewing separately obtained history, performing a medically appropriate examination and/or evaluation, counseling and educating patient/family/caregiver, ordering and reviewing medications, tests, or procedures, referring and communicating with other health care professionals, documenting clinical information in the electronic or other health record, and communicating results to the patient/family/caregiver, and/or other care coordination.    Evangeline Gula, NP  Hematology-Oncology BMT/CT  Pager: [phone]  Cell: [phone]  05/13/22  5:15 PM

## 2022-05-13 NOTE — Plan of Care (Signed)
Problem: Discharge Planning - Adult  Goal: Knowledge of and participation in plan of care  Outcome: Progress within 12 hours     Problem: Discharge Planning - Adult  Goal: Knowledge of and participation in plan of care  Outcome: Progress within 12 hours     Problem: Nutrition, Alteration in -Adult  Goal: Adequate nutritional intake  Outcome: Progress within 12 hours  Goal: Maximize nutritional intake per patient condition  Outcome: Progress within 12 hours     Problem: Pressure Injury,At Risk and Actual- Adult  Goal: Absence of any new pressure injury  Outcome: Progress within 12 hours  Goal: Pressure injury healing/stabilization  Outcome: Progress within 12 hours  Goal: Participation in preventative efforts and treatment plan (Patient/Family/Caregiver)  Outcome: Progress within 12 hours  Goal: Adequate nutritional intake  Outcome: Progress within 12 hours

## 2022-05-13 NOTE — Interdisciplinary (Addendum)
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  She reported pt is clinically accepted but needed to submit to insurance for auth.  CM sent updated rehab notes per her request.  CM will check back tomorrow.    [facility] acute, [phone] CM sw [place] - She asked if pt was on Isolation, but since whe's not, will try to submit the auth tomorrow 7/4, and will possibly know by 7/5.       Pam Drown  MSN, RN, CM  [facility] Case Management  Hematology, Oncology    05/13/2022

## 2022-05-13 NOTE — Consults (Signed)
OCCUPATIONAL THERAPY PROGRESS NOTE      Diagnosis and brief medical history: 55F MM, chronic diarrhea (CMV colitis vs norovirus infection), acute liver injury, admitted for persistent diarrhea and FTT    Assessment and Treatment Plan    OT Progress summary: Pt agreeable and motivated for OT session. Pt demonstrated improved static sitting balance as well as bed mobility. Pt however p/w decreased back extensor strength requiring mod A for 1x LOB anteriorly. Pt min A scooting to EOB. Pt engaged in grooming EOB with bilateral UE support on tray for balance. Pt demosntrated improved strength with lateral leans requiring min A to recover. Pt tolerated session well and making good progress. Pt continues to be limited by global weakness in LEs, UEs, and trunk. Pt requires max A for ADls. Recommend placement.  Progress with OT: Good progress  Current maximal level of assist: Maximal assist  Next session focus:      Recommendations    Recommended Discharge Disposition Placement for continued therapy   Discharge DME Recommendations Defer to next level of care   Equipment Recommendations     DME Comment     Discharge Recommendations Comment     Rehab Potential Patient participates well in therapy and is progressing towards goal   Anticipated Assistance Available at Discharge Family;Spouse/Significant other   Anticipated Type of Assistance Available at Discharge Assist as needed   Anticipated Time of Assistance Available at Discharge Unknown at this time   Barriers to Discharge Medical issues   Patient's current functional ability appropriate for D/C recommendations Yes     Inpatient Recommendations  OT Inpatient Recommendations     Recommendation Comments Hoyer lift to high back chair   Current Maximal Level of Assist Needed Maximal assist     Brace/Precautions   Brace/Orthotic/Prosthetic:   Precautions:Has weight bearing limitation or precaution: Yes  Precautions and weight bearing status comments: Falls, delirium, skin,  aspiration, contact     Subjective Report:"I think we had to get this spot to get better"    Patient/Family Goal:     Objective Findings and Interventions    Areas of Occupation    Grooming and Light Hygiene Minimal assistance   Cueing Required     Comment min A sitting balance, to CG with tray table for bilateral UE support. Brush teeth and wash face   Self Feeding     Cueing Required     Comment     Toileting Contact guard assistance   Cueing Required     Toileting Clothing Management Maximal assistance   Cueing Required     Comment CG rolling and max A pericare   Upper Body Dressing     Cueing Required     Comment     Lower Body Dressing     Cueing Required     Comment     Upper Body Bathing     Cueing Required     Comment     Lower Body Bathing     Cueing Required     Comment     ADL/IADL Comment         Functional Transfers    Bed Mobility From Supine   Bed Mobility To Sitting EOB   Level of Assist Moderate assistance   Cueing Required     Transfer From Sit   Transfer To     Level of Assist     Cueing Required     Technique     Functional Transfer Comment Supine to  sit w/ mod A and HOB elevated. Pt tolerated sitting EOB for 15 minutes with bilateral UE support for grooming at EOB. Pt instructed in lateral leaning and focusing on static sitting balance. Min A to recover from lateral leaning and CG for static sitting balance. 1x mod A for LOB anteriorly. Pt sit to supine 2p max A.     Functional Balance for ADLs  Position Static Dynamic   Sit Static sitting level of assist: Minimal assist-     Dynamic sitting level of assist: Minimal assist-  Dynamic sitting comment: with UE support on tray     Stand  -      [facility] AM-PAC for ADLs  6 Click Score: 14    Client Factors  Cognitive deficits noted in the following area(s):: Attention, Memory, Judgement/Safety  Cognition comment:     Problem solving;Safety awareness;Information processing;Problem  ID;Initiation;Organization;Sequencing;Self-regulation;Working Chief of Staff functions comment:                                   Roma Schanz, OT  05/13/2022 3:03 PM

## 2022-05-13 NOTE — Interdisciplinary (Incomplete Revision)
CASE MANAGEMENT FOLLOW UP NOTE     Clinical Status  40F MM, chronic diarrhea (CMV colitis vs protracted norovirus infection - now cleared), acute liver injury, admitted for workup of persistent diarrhea (non-infectious, now at baseline 1-2/day) and FTT       Functional Status  Mobility/Safety  Number of Person to Assist: 2 persons  Assistive Device Used: None    Financial Status  Payor:  AETNA  Second Payor:      EDD           Additional Assessment/Plan Details  Pt sw Ashley @ [facility] [phone].  She reported pt is clinically accepted but needed to submit to insurance for auth.  CM sent updated rehab notes per her request.  CM will check back tomorrow.        Pam Drown  MSN, RN, CM  [facility] Case Management  Hematology, Oncology    05/13/2022

## 2022-05-14 LAB — BASIC METABOLIC PANEL (NA, K, CL, CO2, BUN, CR, GLU, CA)
Anion Gap: 4 (ref 4–14)
Calcium, total, Serum / Plasma: 8.1 mg/dL — ABNORMAL LOW (ref 8.4–10.5)
Carbon Dioxide, Total: 31 mmol/L — ABNORMAL HIGH (ref 22–29)
Chloride, Serum / Plasma: 102 mmol/L (ref 101–110)
Creatinine: 0.64 mg/dL (ref 0.55–1.02)
Glucose, non-fasting: 118 mg/dL (ref 70–199)
Potassium, Serum / Plasma: 4.1 mmol/L (ref 3.5–5.0)
Sodium, Serum / Plasma: 137 mmol/L (ref 135–145)
Urea Nitrogen, Serum / Plasma: 30 mg/dL — ABNORMAL HIGH (ref 7–25)
eGFRcr: 100 mL/min/{1.73_m2} (ref >59)

## 2022-05-14 LAB — ASPARTATE TRANSAMINASE: AST: 41 U/L (ref 5–44)

## 2022-05-14 LAB — COMPLETE BLOOD COUNT WITH DIFFERENTIAL
Abs Basophils: 0.02 10*9/L (ref 0.00–0.10)
Abs Eosinophils: 0.06 10*9/L (ref 0.00–0.40)
Abs Imm Granulocytes: 0.07 10*9/L (ref <0.10)
Abs Lymphocytes: 0.79 10*9/L — ABNORMAL LOW (ref 1.00–3.40)
Abs Monocytes: 0.59 10*9/L (ref 0.20–0.80)
Abs Neutrophils: 4.28 10*9/L (ref 1.80–6.80)
Hematocrit: 27.2 % — ABNORMAL LOW (ref 36.0–46.0)
Hemoglobin: 8.2 g/dL — ABNORMAL LOW (ref 12.0–15.5)
MCH: 32.7 pg (ref 26.0–34.0)
MCHC: 30.1 g/dL — ABNORMAL LOW (ref 31.0–36.0)
MCV: 108 fL — ABNORMAL HIGH (ref 80–100)
MPV: 13 fL — ABNORMAL HIGH (ref 9.1–12.6)
Platelet Count: 155 10*9/L (ref 140–450)
RBC Count: 2.51 10*12/L — ABNORMAL LOW (ref 4.00–5.20)
RDW-CV: 17.1 % — ABNORMAL HIGH (ref 11.7–14.4)
WBC Count: 5.8 10*9/L (ref 3.4–10.0)

## 2022-05-14 LAB — PHOSPHORUS, SERUM / PLASMA: Phosphorus, Serum / Plasma: 3.3 mg/dL (ref 2.3–4.7)

## 2022-05-14 LAB — ALANINE TRANSAMINASE: Alanine transaminase: 18 U/L (ref 10–61)

## 2022-05-14 LAB — MAGNESIUM, SERUM / PLASMA: Magnesium, Serum / Plasma: 2.1 mg/dL (ref 1.6–2.6)

## 2022-05-14 LAB — ALKALINE PHOSPHATASE: Alkaline Phosphatase: 186 U/L — ABNORMAL HIGH (ref 38–108)

## 2022-05-14 LAB — BILIRUBIN, TOTAL: Bilirubin, Total: 0.6 mg/dL (ref 0.2–1.2)

## 2022-05-14 MED ORDER — LOPERAMIDE 2 MG TABLET
2 | Freq: Three times a day (TID) | ORAL | Status: DC
Start: 2022-05-14 — End: 2022-05-27
  Administered 2022-05-15 – 2022-05-28 (×40): via GASTROSTOMY

## 2022-05-14 MED FILL — ONDANSETRON HCL 4 MG TABLET: 4 mg | ORAL | Qty: 2

## 2022-05-14 MED FILL — URSODIOL (BULK) 100 % POWDER: 100 % | Qty: 0.6

## 2022-05-14 MED FILL — HYDROMORPHONE 2 MG TABLET: 2 mg | ORAL | Qty: 1

## 2022-05-14 MED FILL — SIMETHICONE 40 MG/0.6 ML ORAL DROPS,SUSPENSION: 40 mg/0.6 mL | ORAL | Qty: 1.2

## 2022-05-14 MED FILL — XIFAXAN 550 MG TABLET: 550 mg | ORAL | Qty: 1

## 2022-05-14 MED FILL — SPIRONOLACTONE 100 MG TABLET: 100 mg | ORAL | Qty: 1

## 2022-05-14 MED FILL — THERA M PLUS (FERROUS FUMARATE) 9 MG IRON-400 MCG TABLET: 9 mg iron-400 mcg | ORAL | Qty: 1

## 2022-05-14 MED FILL — FUROSEMIDE 20 MG TABLET: 20 mg | ORAL | Qty: 2

## 2022-05-14 MED FILL — ANTI-DIARRHEAL (LOPERAMIDE) 2 MG TABLET: 2 mg | ORAL | Qty: 2

## 2022-05-14 MED FILL — LANSOPRAZOLE 3 MG/ML ORAL SUSPENSION COMPOUNDING KIT: 3 mg/mL | ORAL | Qty: 10

## 2022-05-14 MED FILL — FOLIC ACID 1 MG TABLET: 1 mg | ORAL | Qty: 1

## 2022-05-14 MED FILL — HYDROCODONE 5 MG-ACETAMINOPHEN 325 MG TABLET: 5-325 mg | ORAL | Qty: 2

## 2022-05-14 MED FILL — HYDROXYZINE HCL 25 MG TABLET: 25 mg | ORAL | Qty: 1

## 2022-05-14 MED FILL — PREVYMIS 480 MG TABLET: 480 mg | ORAL | Qty: 1

## 2022-05-14 MED FILL — THIAMINE MONONITRATE (VITAMIN B1) 100 MG TABLET: 100 mg | ORAL | Qty: 1

## 2022-05-14 MED FILL — CHOLECALCIFEROL (VITAMIN D3) 25 MCG (1,000 UNIT) TABLET: 1000 UNITS | ORAL | Qty: 2

## 2022-05-14 MED FILL — MIRTAZAPINE 30 MG TABLET: 30 mg | ORAL | Qty: 1

## 2022-05-14 NOTE — Consults (Signed)
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    Medical Update:        ASSESSMENT  Pt super anxious today and c/o abdominal pain. States that she is worried she may throw up and once she starts it's hard to stop. Pt expressing feelings of not being listened to and taking a back seat to other peoples agendas. Session took on more of a therapeutic listening approach for rapport building, goal setting, and level setting realistic expectations. Pt with some cyclical speech and poor integration of edu provided regarding how to meet her mobility goals to improve her sense of control. Pt reports that she feels PT is going from A to Z without doing the steps in between to progress her mobility. Per discussion with dtr, pt was last ambulatory in 10/2021. Pt has been largely bed and w/c bound x7 months. Provided pt edu on adequate challenge needed during therapy session in order to realistically achieve her stated goal of being able to walk again. If patient is unable to engage in daily multidisciplinary rehab and mobility OOB with nursing, her functional prognosis will be limited and anticipate she we dependent on lift equipment and social support for all mob and ADLs. 40 min spent with patient at bedside and pt disappointed to have PT leave. Next session needs to be centered on functional mobility EOB or with LE weight bearing in combi.    Focus next session:      RECOMMENDATIONS  DISCHARGE RECOMMENDATION  Comments Placement with ongoing PT needs  SNF   Patient Current Functional Status Sufficient for PT Discharge Recommendation Yes   Discharge DME needs TBD   Discharge transportation needs [facility] Potential Patient participates well in therapy and progressing towards goals     NURSING RECOMMENDATIONS  Inpatient Rehab Assistive Device Recommendation Vertical dependent lift;Lateral  transfer device   Activity Recommendation OOB to chair daily via sling lift     SUBJECTIVE  Subjective report:  Pt c/o abdominal pain and anxiety. Resistant to mobility with PT today. Expressins stress surrounding her mother's care who is a diabetic and her son who has a brain tumor and will need surgery.  Notable observations:  Pt in bed throughout session. Encouraged OOB with nursing assist for hoyer to w/c in PM.    SYSTEMS REVIEW  Cognition/Communication  Delirium screen:  Yes Delirium Screen:   Details:  at risk given prolonged hospitalizations and decreased mob    Communication  Cognition/communication impaired:  Yes     Cognition comment: impaired memory, cyclical speech surrounding her anxieties and stressors related to her and her families health, decreased receptivity and understanding of PT edu regarding realistic expectations in order to attain her mobility goals  Behavior: Self-limiting, Anxious  Behavior comment: poor self-initiation in mob    Integumentary  Integumentary deficits:  YesIntegumentary deficit detail:  PEG    Cardiopulmonary  Cardiopulmonary deficits:  Yes  Detail:  decreased activity tolerance    Musculoskeletal  Musculoskeletal deficits:  Yes  Abnormal strength findings: B LE grossly 3-/5  Abnormal range of motion findings: gastroc tightness ankles unable to achieve neutral, shoulder flexion AAROM ~100 degrees    Neuromuscular  Neuromuscular deficits:  Yes  Motor control or coordination: impaired LEs        Pain     Currently in pain: Yes  Pain location: abdominal pain,  medicated during session by RN          COMPREHENSIVE MOVEMENT ANALYSIS/TREATMENT  Precautions/WB status: No    Functional Mobility  Requires second person/additional health care providers: Yes  Type additional health care provider: RN  Balance  Balance deficits noted:      Communication between other health care providers: Communication between other health care provider: RN;OT  Communication comment:       Education  assessment  Learner: Patient  Content: Plan of care, Activity recommendations, Discharge recommendations  Response: Needs reinforcement, Verbalizes understanding, Demonstrates understanding  Outcome measures      AMPAC 6-clicks basic mobility score:        PLAN  Plan of care status:  Current plan of care remains appropriate  PT frequency:  5x/week  PT duration:  4 weeks.  Comment:       Planned PT interventions:   Specific interventions: Progressive functional mobility training;Balance training;NM Re-ed;Aerobic training;Ther ex  Education interventions: Fall risk reduction;Exercise program;Self-pacing/breathing;Benefits of activity;Caregiver training  Comment:          Vita Erm, PT    05/14/2022

## 2022-05-14 NOTE — Consults (Signed)
Treatment Note    Interval History  Interval History  Interval History: 6/21 MBSS completed and yielding appearance of mild-moderate oropharyngeal dysphagia impacting both safety and efficiency. Routine trace aspiration on thins during the swallow, sensate ~ 50% of the time w/ poor clearance of aspirated material w/ reflexive cough; cued volitional cough effective at clearing majority of aspirated material. Mild-moderate diffuse pharyngeal residue (increased viscosities > thin) improved w/ f/u swallow and liquid wash. Although not noted during exam, concern for potential aspiration on residue after the swallow.  Language Assistant  Interpreter Needed?: No    Subjective  Subjective  Patient subjective: Received pt. sitting upright in chair. Daughter and son were at bed side upon arrival. Pt. reported she would prefer to conduct education and DYS tx on another day following brief visit        Rodena Goldmann Pain Scale  Pain Level Upon Arrival: No hurt  Highest Pain Level During Therapy: Hurts whole lot  Pain Level End of Therapy: Hurts a little bit           Objective  Short term goal(s) progress: Dysphagia f/u tx. Pt. endorsed remaining on soft food diet despite being placed on regular diet with poor PO 2/2 to suspected poor appetite or abdominal pain. Pt.'s daughter reported multiple swallows per bite/sip are partically effective w/ pt's swallowing slightly improving. Reviewed compensatory swallow strategie (i.e., 3-5 swallows, prophylactic cough and swallow, alternating liquids and solids) + aspiration precautions. New exercise of "effortful swallow" was introduced to aid pharyngeal strengthening upon request of daughter "is there anything else she can be doing to help her swallow?" The pt. stated preference to conduct tx on a different day due to fatigue. Education was redirected to daughter in which the SLP team discussed bringing food from outside the hospital to optimize PO intake, will discuss w/ team. The pt.  reports wanting to try a hamburger.    Assessment  Assessment  Impressions: Per 6/21 MBSS findings, pt appears to continue to have mild-moderate oropharyngeal dysphagia suspected 2/2 to deconditioning and possible rheological impact c/w xerostomia exacerbated by cognitive decline. Continue to regular solids w/ mildly thickened liquids; pills via PEG; regular/unthickened liquids allowed outside of meals. Spoke with daugher and pt, and later primary team, about allowing food from outside hospital to optimize PO. Continue to practice strategies of small single bites/sips, 3-5 swallows per bite/sip, slow pacing, cued cough + re-swallow, oral care prior to PO, upright for 30 minutes following PO and after oral care. Introduced pharyngeal strengthening exercise (i.e., effortful swallow). SLP will follow up for tx intervention w/ OP SLP warranted upon DC. Recommending to repeat MBSS in 1-2 months or w/ swallow improvement. Will continue to follow while in house.  Current Functional Level  Domain: Swallowing  Swallowing: <Level 4>  Mild-moderate dysphagia: Intermittent supervision/cueing, one or two consistencies restricted  Rehab Potential  Rehab potential judged to be: Good  Facilitators: Motivation;Family/community support;Insight into deficits  Barriers: Co-morbidities    Recommendations  Swallowing Recommendations  Solids: Regular  Liquids: Level 0 (Thin)  Liquids recommendation comment(s): Daughter prefers to thicken liquids with personal powder for consistency. When daughter is not pt., consent provided to provide with mildly thick liquids provided by hospital.  Liquid administration: Spoon;Cup  Medication administration: Non-oral  Supervision during meals: 1:1 assistance  Mealtime strategies and positioning: Upright;Remain upright 30 minutes after eating;Alternate solids and liquids;Small bites;Small single sips;Slow pace  Meal strategy comment(s): take small bites, 3-5 swallows per bite/sip, and practice oral care  prior to all PO, prophylatic cough                Plans and Goals  Plan: Continue per outlined plan of care       Discharge Recommendations: Outpatient SLP       Updated Short Term Goals    05/03/22 1324 05/06/22 1316   Short Term Goals- Updated: The patient/family/caregiver will identify and demonstrate compensatory swallow strategies and/or precautions to improve safe and efficient swallowing with 100% accuracy The patient/family/caregiver will identify and demonstrate compensatory swallow strategies and/or precautions to improve safe and efficient swallowing with 100% accuracy       [name]  05/14/2022

## 2022-05-14 NOTE — Progress Notes (Signed)
[facility] PROGRESS NOTE     This is an independent service.   The available consultant for this service is Alena Bills, MD.             24 Hour Course  - HDS, afebrile  - uncharted multiple bowel movements overnight --> increased loperamide back up to TID atc  - See CM note for d/c updates, informed family   - see SLP eval, wrote order for OK for outside food    Subjective  Sitting up in bed, OT in room during assessment as well, reports abdominal upset (attributes to multiple BMs overnight and or switch to cyclic feeds though unclear).     Vitals  Temp:  [36.7 C (98.1 F)-37.1 C (98.8 F)] 37 C (98.6 F)  Heart Rate:  [82-84] 82  *Resp:  [18] 18  BP: (90-100)/(62-74) 91/71  SpO2:  [97 %-100 %] 99 %    MostRecent Weight: 67.4 kg (148 lb 9.4 oz)  Admission Weight: 66.2 kg (146 lb)      Intake/Output Summary (Last 24 hours) at 05/14/2022 1813  Last data filed at 05/14/2022 1719  Gross per 24 hour   Intake 1620 ml   Output 500 ml   Net 1120 ml       Physical Exam  Gen: NAD, appropriate and engaged  HEENT: MMM, no oral lesions, sclera non-icteric  Resp: CTAB, no rhonchi/wheezing  Card: RRR  Gi: distended, soft, non-tender, +bs x4  Ext: negative BLE edema   Skin: scattered ecchymosis to BUE  Neuro: Alert, oriented x3, no focal neuro deficits  Lines: PIV x1 in place, clean dry & intact dressing    Scheduled Meds:   0.9% sodium chloride flush  3 mL Intravenous Q12H [place]    cholecalciferol (vitamin D3)  2,000 Units Per G Tube Daily [place]    cyanocobalamin (Vitamin B12)  1,000 mcg Subcutaneous Q7 Days    folic acid  1 mg Per G Tube Daily [place]    furosemide  40 mg Per G Tube Daily [place]    lansoprazole  30 mg Per G Tube Q AM Before Breakfast [facility]    letermovir  480 mg Other Daily [place]    loperamide  4 mg Feeding Tube Q8H    mirtazapine  30 mg Per G Tube Daily At Bedtime [facility]    multivitamin with iron-minerals-folic acid  1 tablet Feeding Tube Daily SCH    ondansetron  8 mg Per G Tube TID    rifAXIMin  550 mg  Feeding Tube BID SCH    simethicone  80 mg Oral 4x Daily [place]    spironolactone  100 mg Per G Tube Daily [place]    thiamine mononitrate (vit B1)  100 mg Per G Tube Daily [place]    ursodiol  600 mg Per G Tube BID [place]     Continuous Infusions:  PRN Meds:   0.9% sodium chloride flush  3 mL Intravenous PRN    acetaminophen  650 mg Per G Tube Q8H PRN    albuterol  2.5 mg Nebulization Q4H PRN    Or    albuterol  2.5 mg Nebulization Q4H PRN    aluminum-magnesium hydroxide-simethicone  15 mL Per G Tube Q4H PRN    HYDROcodone-acetaminophen  1-2 tablet Per G Tube Q6H PRN    HYDROmorphone  1 mg Oral Q4H PRN    hydrOXYzine  25 mg Feeding Tube Q4H PRN    opium  6 mg Oral 4x Daily  PRN    prochlorperazine  5 mg Intravenous Q8H PRN    Or    prochlorperazine  5 mg Per G Tube Q8H PRN       Data    CBC        05/14/22  0606   WBC 5.8   HGB 8.2*   HCT 27.2*   PLT 155       Coags  No results found in last 36 hours      Chem7        05/14/22  0606   NA 137   K 4.1   CL 102   CO2 31*   BUN 30*   CREAT 0.64   GLU 118       Electrolytes        05/14/22  0606   CA 8.1*   MG 2.1   PO4 3.3       Liver Panel        05/14/22  0606   AST 41   ALT 18   ALKP 186*   TBILI 0.6       UA  No results found in last 7 days    Microscopy:  No results found in last 7 days    Microbiology Results (last 72 hours)       Procedure Component Value Units Date/Time    Bacterial Culture, Normally Sterile Sites, With Gram Stain [[id number]] Collected: 05/11/22 1115    Order Status: Completed Specimen: Not Applicable from Peritoneal Fluid Updated: 05/14/22 0921     Gram Stain Rare Host cells (not PMNs, RBCs, or squamous epithelial cells) , No PMNs seen      No organisms seen     Bacterial Culture, Sterile Sites No growth 3 days.    Cytomegalovirus DNA, Quantitative PCR, Plasma [[id number]]  (Abnormal) Collected: 05/11/22 0620    Order Status: Completed Specimen: Blood from Serum Updated: 05/12/22 1514     CMV DNA (IU/mL) 44 IU/mL      Comment: *SUBCRITICAL*  Performed by  Real-Time PCR  Note new reference range.          CMV DNA (Log IU/mL) 1.64     Comment: Log10 IU/mL  Note new reference range.               Radiology Results  No results found.    I discussed the patient with Johnney Ou* from [facility] regarding A&P.    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  2011: VRd bortezomib, Lenalidomide, Dexamethasone  04/2011: Auto SCT  07/2014: Rd - revlimid 15 mg every other day, dexamethasone 8 mg PO weekly.   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   Receiving dexamethasone 12 mg weekly for diarrhea as outpt  10/16/21 - last Daratumumab dose (on hold due to diarrhea)     DATA:   09/07/2010: IgG Kappa MM  12/02/2021: Ig M <5, IgG 447, IgA 20, Ig E <2, SFLC Kappa 16.6, Lambda 1.6, Kappa/Lamda 10.38  12/14/21: IgG K on IFE, IgG 430, KLC 16.6, LLC 2.7, KLR 6.15  02/23/22  SPEP with stable M-spike, SFLC decreased Kappa 44.2 / Lambda 5.5 = K/L ratio = 8.04 (overall downtrending still)  04/23/22 M-spike 0.6, IgG K on IFE, IgG 997, Kappa 70.7/Lambda 19.8 = K/L ratio = 3.57  Monitor MM labs monthly per Dr Jacelyn Grip, next due 7/9     Chemo:  TBD, therapy held since 10/2021 due to recurrent infections and now deconditioning  Not currently a candidate due to significant deconditioning, may be candidate in the future if improved.     Supportive Care:   Transfuse for Hgb <7 and Platelets <10  Access: PIV     Primary Oncologist:   Dr. Jacelyn Grip ([place]), will transfer to Dr. Hassell Done ([place])  Dr. Iona Coach ([place])     Disposition:  Lives in [place] - anticipate discharge to SNF, currently working with PT/OT  - Patient  centered rounds: 6/20  - 6/30 update - awaiting SNF insurance authorization  '[ ]'$  will need paracentesis q7-10 days coordinated outpatient with IR and transportation arranged with family       # Immunocompromised  # Diarrhea - controlled  # c/b CMV Viremia  # Hx prolonged norovirus infection  Immunocompromised due to underlying  malignancy, not currently neutropenic. Recent extended hospitalization at both [place] 1/23-04/16/22, with hypovolemic shock associated with norovirus, CMV colitis, acute liver failure associated with pomylast (see ALF section).  Currently admitted for ongoing diarrhea and FTT in brief time at home after dc from [facility] , initially began in 8/22, recently managed at home w/ loperamide BID w/ 2-3 watery stools daily. Diarrhea worsened on 6/12 (11 stools). Possibly r/t to inadequate dosing of loperamide vs infectious etiology. GI c/s 6/13 for possible scope - per patient and family declined endoscopy at this time. ID consultation deferred as infectious w/u negative.   Given CMV improving, norovirus negative, and enteric panel unrevealing - favor non-infectious etiologies for diarrhea. ID has requested we maintain enteric precautions for hospitalization d/t prolonged prior GI infections.   Diarrhea controlled on current regimen to 1-2 stools/day as of 6/27.     Data  - Norovirus  - First +norovirus 08/2021 with persistent +norovirus s/p 7-day courses x 2 nitazoxanide (2/16-02/22/23 & 01/2022). Continued to be norovirus positive (most recently on 04/07/22). Norovirus, cdiff, GI PCR neg this admission (6/9).   - CMV  -Regarding CMV colitis, 12/17/21 flex sig showed CMV colitis, sp multiple cycles of gancyclovir IV, IVIG, valcyte 900 mg PO BID, then ultimately transitioned to letermovir, which she continues. 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   # Hepatic Encephalopathy  Steady rise in LFTs began 05/2021. Acute rise in 11/2021,coinciding with acute CMV viremia but liver biopsy CMV stains negative (12/11/21). 12/14/21: Hepatitis/viral work-up negative in 11/2021. LP was done on 12/16/21 for hepatic encephalopathy w/ CMV CSF PCR negative. Suspected to be 2/2 pomalidamide, though liver path from 12/22/21 unlikely for vanishing bile  duct syndrome.   Malnutrition may also be contributing to ascites (see below). Upon admission was more somnolent and one episode of possible visual hallucination reported on 6/23. Her mentation has improved throughout this hospitalization  As of 6/29 AxOx3    Dx:  - 6/17 KUB: mild ileus and density over abdomen (likely ascitic fluid) - not clinically significant as patient is having regular bowel movements and no PVR noted.  - 6/20 therapeutic paracentesis by TAPS, -4L + albumin  - 6/23 ammonia normal  - 6/27 paracentesis by TAPS -5L + albumin, SBP unrevealing or infection. cytology negative for malignancy    Plan:  -Continue spironolactone ('100mg'$ ) + lasix ('40mg'$ ) to minimize accumulation of ascites   (started 6/16, increased 6/19- )  Hold for SBP<90 or MAP<60  Will likely need ~q weekly therapeutic paracentesis inpt/outpt - will need to be arranged for d/c   -Continue Ursodiol 600 mg PO BID  -Continue rifaximin 550 mg PO BID  -Low sodium diet  -2L fluid restriction  -Delirium precautions     # Failure to thrive  # PEG tube dependence  # Macrocytic anemia - B12 deficiency  PEG tube placed during last admission. Low B12 level but MMA  normal, which is not consistent with B12 deficiency, however still treating at this time as below. Encouraged PO intake in addition to tube feeds. Transitioned to cyclic tube feeds on 0/98.  6/29 - tolerating cyclic TF and improving oral intake    Plan:  -Continue cyclic TF - Nutren 1.5 @ 75 mL x 16 hours (1800-1000) - TID residuals per RN protocol  -Continue Thera-M Plus MVI for copper deficiency (Copper = 47 on 6/16)  -Continue mirtazapine  '30mg'$  nightly (incr 6/18- ) per palliative recs  -Continue cyanocobalamin 102mg SQ weekly (6/24- )   -s/p Cyanocobalamin 10092m SQ daily x7 days (6/17-6/23)  -s/p Zinc acetate 6/11-6/23)    # Dysphagia  Dysphagia and aspiration risk assessed by speech language pathology. SLP recommended Modified Barium Swallow (completed on 6/21) which showed moderate pharyngeal aspiration; SLP recommends regular diet w/ mildly thickened liquids.   6/22 - Pt and daughter request to thicken liquids themselves at bedside for more variety, understand risk of aspiration w/ thins. Liberalized diet per their request to regular.   Appreciate SLP recs.   - See failure to thrive section as well re: PEG tube and nutritional deficiencies     # Pain  # Pressure Injury (sacrum)  Pain primarily r/t pressure injuries though occasionally also d/t abdominal cramping. Wound healing likely limited d/t poor nutritional status (see above) and limited mobility (improving).  -Consider wound care RN c/s  -Consulted Palliative Care 6/17 for SMS, now s/o  -Continue hydrocodone-acetaminophen PRN  -Continue hydromorphone per G tube PRN     # Physical Deconditioning  Pt motivated to participate w/ PT/OT, deconditioned after prior extended hospitalization (as above). Showing marked improvements in endurance w/ static siting balance & bed mobility.   - Continue PT/OT   - recommend SNF placement    #GOC  Pt is consistent and clear that she wishes to pursue full medical management of her ongoing medical problems, as above,  with the goal of prolonging her life. She is motivated by her desire to spend time with her family. This is consistent in conversations with [facility] (6/18) & Dr. WoJacelyn Grip6/21).     VTE PPx:  Contraindicated: Anticipated procedure    Severity of Illness  Stable    Nutritional Assessment  Severe Chronic disease  or condition related malnutrition    During this hospitalization the patient is also being treated for:  - Anemia associated with malignancy (present on admission)    - Thrombocytopenia (present on admission)    - Coagulopathy (present on admission)    - Malignancy associated fatigue on admission  - Neoplasm/malignancy associated pain  - Hypoalbuminemia (present on admission)  - HYPOnatremia (present on admission)  - Fluid status: Volume overload on admission  - Cachexia  - Acute liver failure, present on admission    Code Status: FULL        I spent a total time of 40 minutes in this episode of care preparing to see the patient, obtaining and/or reviewing separately obtained history, performing a medically appropriate examination and/or evaluation, counseling and educating patient/family/caregiver, ordering and reviewing medications, tests, or procedures, referring and communicating with other health care professionals, documenting clinical information in the electronic or other health record, and communicating results to the patient/family/caregiver, and/or other care coordination.    Rosemary Holms, NP  Hematology-Oncology BMT/CT   Pager # [phone]  05/14/22   6:13 PM

## 2022-05-14 NOTE — Consults (Signed)
OCCUPATIONAL THERAPY PROGRESS NOTE      Diagnosis and brief medical history: 48F MM, chronic diarrhea (CMV colitis vs norovirus infection), acute liver injury, admitted for persistent diarrhea and FTT    Assessment and Treatment Plan    OT Progress summary: Pt continues to make good progress with increased time OOB in chair and today wheelchair. Pt making good progress with trunk strength and anterior weight shifting during seated grooming. Pt engaged in self propelling w/c able to perform 10' w/  increased rest breaks. 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 Pt requires max A for ADLs and continues to demosntrate impaired LE strength and unable to transfer squat pivot or stedy. Recommend placement.  Progress with OT: Good progress  Current maximal level of assist: Maximal assist  Next session focus:      Recommendations    Recommended Discharge Disposition Placement for continued therapy   Discharge DME Recommendations Defer to next level of care   Equipment Recommendations     DME Comment     Discharge Recommendations Comment     Rehab Potential Patient participates well in therapy and is progressing towards goal   Anticipated Assistance Available at Discharge Family;Spouse/Significant other   Anticipated Type of Assistance Available at Discharge Assist as needed   Anticipated Time of Assistance Available at Discharge Unknown at this time   Barriers to Discharge Medical issues   Patient's current functional ability appropriate for D/C recommendations Yes     Inpatient Recommendations  OT Inpatient Recommendations     Recommendation Comments Hoyer lift to high back chair or wheelchair   Current Maximal Level of Assist Needed Maximal assist     Brace/Precautions   Brace/Orthotic/Prosthetic:No  Precautions:Has weight bearing limitation or precaution: Yes  Precautions and weight bearing status comments: Falls, delirium, skin, aspiration, contact     Subjective Report:"Thank  you"    Patient/Family Goal:     Objective Findings and Interventions    Areas of Occupation    Grooming and Light Hygiene Contact guard assistance   Cueing Required     Comment min A for anterior weight shifting to reach toothbrush, toothpaste, and cup.   Self Feeding     Cueing Required     Comment     Toileting     Cueing Required     Toileting Clothing Management     Cueing Required     Comment     Upper Body Dressing     Cueing Required     Comment     Lower Body Dressing     Cueing Required     Comment     Upper Body Bathing     Cueing Required     Comment     Lower Body Bathing     Cueing Required     Comment     ADL/IADL Comment         Functional Transfers    Bed Mobility From Supine;Sidelying   Bed Mobility To Sidelying;Supine   Level of Assist Minimal assistance   Cueing Required     Transfer From Bed   Transfer To Wheelchair   Level of Assist Dependent   Cueing Required     Technique     Functional Transfer Comment Hoyer lift transfer to w/c 1p. Pt wheeled to sink, performed seated grooming. Pt self propel w/c 10' with frequent rest breaks and increased cueing. Pt wheeled after 10' 2/2 fatigue.     Functional Balance  for ADLs  Position Static Dynamic   Sit Static sitting level of assist: Supervision-   Static sitting comment: wheelchair  -        [place] for ADLs  6 Click Score: 14    OT Education  Education  Learner: Patient  Content: Plan of care;Activity recommendations;Discharge recommendations  Response: Needs reinforcement;Verbalizes understanding;Demonstrates understanding  Communication between other health care provider: RN;OT      Roma Schanz, OT  05/14/2022 2:43 PM

## 2022-05-14 NOTE — Consults (Signed)
NUTRITION SERVICES ASSESSMENT    Patient History:   89F MM, chronic diarrhea (CMV colitis vs protracted norovirus infection - now cleared), acute liver injury, admitted for workup of persistent diarrhea (non-infectious, now at baseline 1-2/day) and FTT     Pertinent interval history: nutrition following for TF management .     In-House Nutrition Orders:  Regular Diet     Tube Feeding-Adult (Cyclic): Nutren 1.5   Order Comments:Feeding Route: PEG - Percutaneous Endoscopic Gastrostomy  Run 75 mL/hr for 16 hours from 18:00 to 10:00.    Check residuals every 8 hours during feeding time and hold if equal to or greater than 500 mL.      Anthropometric Data and Assessment:  Height: 153 cm  Ideal Body Weight: 45.99 kg    Usual body weight:90.91 kg (per previous admissions)   Admit weight: 66.2 kg (146 lb) (04/25/22 1427) (Bed scale)  Current weight: 67.4 kg (148 lb 9.4 oz) (05/14/22 0915) (Bed scale)    Calculation Weight: 69 kg (Percent IBW (calculated) (%): 150)  Calc weight BMI: 29.5 kg/m^2  Alternative Weight (kg): 53.4 kg (MAW)    Wt Readings from Last 10 Encounters:   05/14/22 67.4 kg (148 lb 9.4 oz)   01/15/22 96 kg (211 lb 10.3 oz)   12/12/18 96.3 kg (212 lb 4.9 oz)   08/08/18 98.9 kg (218 lb 1.6 oz)   05/23/18 98.7 kg (217 lb 9.6 oz)   04/11/18 98.5 kg (217 lb 1.6 oz)   01/10/18 98.4 kg (217 lb)   09/16/17 96.6 kg (213 lb)   06/21/17 96.4 kg (212 lb 9.6 oz)   03/15/17 95.6 kg (210 lb 12.8 oz)       Total weight change: 1.2 kg (1.81 %)  Time frame for weight change: 2 weeks    Assessment of weight change: Weight stable  Wt stable in-house    Nutrition-focused physical findings:   Physical Findings (Exam Date 6/30)  Extremities: Lower extremity edema  Muscles  Temples: Severe Depletion: Hollowing, scooping, depression  Clavicle: Severe Depletion: Protruding, prominent bone  Shoulders: Severe Depletion: Shoulder to arm joint looks square. Bones prominent. Acromion protrusion very prominent.  Interosseous Muscles:  Mild/Moderate Depletion: Slightly depressed  Patellae:  (UTA, pt declined lower body NFPE)  Quadriceps:  (UTA, pt declined lower body NFPE)  Calves:  (UTA, pt declined lower body NFPE)  Adipose  Orbital: Mild/Moderate Depletion: Slightly dark circles, somewhat hollow look  Triceps: Well-nourished: Ample fat tissue obvious between folds of skin    Edema (per RN assessment):   Generalized Edema: Non-pitting (05/14/22 0930)  Sacral: Mild pitting, slight indentation (05/06/22 2300)  RLE Edema: Non-pitting (05/11/22 2057)  LLE Edema: Non-pitting (05/11/22 2057)    Cognition (per RN assessment):  Level of Consciousness: Alert, Awake  Orientation Level: Oriented to place, Oriented to time, Oriented to person, Disoriented to situation  Cognition:  (redirectable)    GI Findings and Assessment: She endorsed gas today, having x1-x2 BM/day in-house over past 2 weeks      GI: Excessive flatus   Per RN assessment:   Gastrointestinal  *Gastrointestinal (WDL): Exceptions to WDL  Mucous Membrane(s): Intact, Dry  Abdomen Inspection: Soft, Rounded, Tube/drain  Bowel Sounds (All Quadrants): Present  Tenderness: Tenderness  Passing Flatus: Yes  GI Symptoms: Nausea    Lines, Drains, Airway, Wounds:   Patient Lines/Drains/Airways Status       Active LDAs       Name Placement date Placement time Site Days  Peripheral IV 05/11/22 Left;Posterior Hand 05/11/22  1155  Hand  3    Wound 12/19/21 Skin tear Sacrum 12/19/21  1900  Sacrum  145    Wound 04/14/22 Back Right 04/14/22  --  Back  30    Wound 04/16/22 Abdomen Right;Left 04/16/22  --  Abdomen  28    Feeding Tube Left lower quadrant --  --  LLQ  --                    Medical Tests and Procedures:   6/22 SLP   Recommendations  Swallowing Recommendations  Solids: Regular  Liquids: Level 2 (Mildly thick)  Liquids recommendation comment(s): Daughter prefers to thicken liquids with personal powder for consistency. When daughter is not pt., consent provided to provide with mildly thick liquids  provided by hospital.  Liquid administration: Cup;Spoon  Medication administration: Non-oral  Supervision during meals: 1:1 assistance  Mealtime strategies and positioning: Upright;Remain upright 30 minutes after eating;Alternate solids and liquids;Small bites;Small single sips;Slow pace  Meal strategy comment(s): take small bites,3-5 swallows per bite/sip, and practice oral care prior to all PO.  prophylatic cough    Food and Nutrition Intake:   Information obtained from: Patient, Chart  Percentage of meals eaten for the past 168 hrs:   Percent Meals Eaten (%)   05/14/22 1121 0 %   05/14/22 0945 0 %   05/14/22 0600 0 %   05/13/22 2000 0 %   05/13/22 1100 0 %   05/11/22 0900 0 %   05/11/22 0400 0 %   05/10/22 0800 0 %   05/10/22 0500 0 %   05/10/22 0300 0 %   05/09/22 2300 0 %   05/09/22 1500 10 %   05/09/22 1300 0 %   05/09/22 1000 0 %   05/08/22 2137 0 %   05/08/22 1100 0 %   05/07/22 2155 0 %     Enteral/Parenteral Nutrition Intake  Enteral Nutrition Intake Details: 6/27 transitioned to cycle TF, currently running @ goal    Oral Food and Fluid Intake  Meal Intake Percentage: <50% of meals  Poor Meal Intake Assessment Period: greater than or equal to 7 days    Diet Recall: Other (comment)  Diet Recall Details: minimal PO in-house continues, TF meeting estimated needs       Intake Data (Date(s): 6/23-6/29)  Average Daily Intake   Calories (total) Protein (total)   Enteral Nutrition 1666 kcal 76 g   Total intake per day 1666 kcal 76 g    Assessment of energy/protein intake: Adequate protein/energy intake (greater than or equal to 75% estimated needs)                Significant Labs:   Biochemical:  Lab Results   Component Value Date    Sodium, Serum / Plasma 137 05/14/2022    Potassium, Serum / Plasma 4.1 05/14/2022    Chloride, Serum / Plasma 102 05/14/2022    Carbon Dioxide, Total 31 (H) 05/14/2022    Urea Nitrogen, Serum / Plasma 30 (H) 05/14/2022    Creatinine 0.64 05/14/2022    eGFRcr 100 05/14/2022    Glucose  101 (H) 05/10/2019    Glucose, non-fasting 118 05/14/2022    Calcium, total, Serum / Plasma 8.1 (L) 05/14/2022    Calcium, Ionized, whole blood 1.17 04/23/2022    Magnesium, Serum / Plasma 2.1 05/14/2022    Phosphorus, Serum / Plasma 3.3 05/14/2022     Lab Results  Component Value Date    Alanine transaminase 18 05/14/2022    AST 41 05/14/2022    Alkaline Phosphatase 186 (H) 05/14/2022    Bilirubin, Total 0.6 05/14/2022    Gamma-Glutamyl Transpeptidase 93 (H) 05/06/2022    PT 15.6 (H) 05/10/2022    Ammonia 32 05/08/2022     Lab Results   Component Value Date    Lactate, whole blood 1.0 04/23/2022     Lab Results   Component Value Date    Hemoglobin 8.2 (L) 05/14/2022    MCV 108 (H) 05/14/2022    Abs Neutrophils 4.28 05/14/2022     Lab Results   Component Value Date    Glucose, Glucometer 79 05/07/2022    Glucose, Glucometer 89 12/30/2021    Glucose, Glucometer 99 12/29/2021    Glucose, Glucometer 105 12/29/2021    Glucose, Glucometer 84 12/28/2021    Glucose, Glucometer 88 12/28/2021    Glucose, Glucometer 93 12/27/2021    Glucose, Glucometer 81 12/27/2021    Glucose, Glucometer 102 12/26/2021    Glucose, Glucometer 86 12/26/2021     Micronutrient Profile:  Lab Results   Component Value Date    Vitamin D, 25-Hydroxy 11 (L) 04/23/2022    Ferritin, Serum/Plasma 948 (H) 12/20/2021    Iron, serum 60 12/11/2021    Transferrin, Serum/Plasma 46 (L) 12/11/2021    % Saturation 93 (H) 12/11/2021    Vitamin B12 242 (L) 04/28/2022    MMA, Serum 0.32 04/28/2022    Folate, serum 12.5 04/28/2022    Copper, serum/plasma 47 (L) 04/30/2022   -Deficient Vit D, on appropriate repletion dosage   -noted low copper, started on TheraM Plus MVI to provide 3 mg copper daily   -deficient B12, on appropriate repletion dosage     Inflammatory profile:   Lab Results   Component Value Date    C-Reactive Protein 11.3 (H) 12/13/2021    Albumin, Serum / Plasma 1.9 (L) 05/10/2022    WBC Count 5.8 05/14/2022     Stool studies:  Lab Results    Component Value Date    Clostridium difficile Toxin gene: Not detected 04/23/2022    Clostridium difficile Comment: Negative test 04/23/2022       Significant Meds:   Scheduled Meds:   0.9% sodium chloride flush  3 mL Intravenous Q12H [place]    cholecalciferol (vitamin D3)  2,000 Units Per G Tube Daily [place]    cyanocobalamin (Vitamin B12)  1,000 mcg Subcutaneous Q7 Days    ergocalciferol (vitamin D2)  50,000 Units Per G Tube Q7 Days    folic acid  1 mg Per G Tube Daily [facility]    furosemide  40 mg Per G Tube Daily [place]    lansoprazole  30 mg Per G Tube Q AM Before Breakfast [facility]    letermovir  480 mg Other Daily [place]    loperamide  4 mg Feeding Tube Q12H [facility]    mirtazapine  30 mg Per G Tube Daily At Bedtime [facility]    multivitamin with iron-minerals-folic acid  1 tablet Feeding Tube Daily SCH    ondansetron  8 mg Per G Tube TID    rifAXIMin  550 mg Feeding Tube BID SCH    simethicone  80 mg Oral 4x Daily [place]    spironolactone  100 mg Per G Tube Daily [place]    thiamine mononitrate (vit B1)  100 mg Per G Tube Daily [place]    ursodiol  600 mg Per G Tube BID [facility]  PRN Meds:.0.9% sodium chloride flush, acetaminophen, albuterol **OR** albuterol, aluminum-magnesium hydroxide-simethicone, HYDROcodone-acetaminophen, HYDROmorphone, hydrOXYzine, opium, prochlorperazine **OR** prochlorperazine    Comparative Standards:   Energy Needs: MSJ: 8119- 1478 (based on 1175.25: x 1.4 - 1.5)  Protein Needs: 69.42 g - 80.1 g (1.3 - 1.5 g/kg based on 53.4 kg (MAW))  Fluid needs: 30 mL/kg: 1602 mL/d     Nutrition diagnosis:   Inadequate oral intake     related to poor appetite, AMS as evidenced by reliance on EN supplementation to meet nutrient needs, 0% PO on I/Os. Active.    Inadequate energy intake     related to insufficient enteral nutrition infusion as evidenced by >75% estimated kcal/pro needs received from TF over past week. Resolved.                  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 .  Present on admission.         Nutrition Intervention:  Oral Nutrition:    Inpatient Orders: Current diet order appropriate         Nutrition recommendations: Energy Modification, Protein Modification  Energy Modification: Increased energy intake  Protein Modification: Increased protein intake          Oral Nutrition Supplements and/or Food Fortification  Supplemental nourishment plan: Continue oral nutrition supplements  Name and nutrient provision: [place]  Frequency recommendation: 1x daily     Enteral Nutrition:   Enteral nutrition plan: Continue enteral nutrition as ordered  Cyclic Tube Feedings  --> Cycle Nutren 1.5 @ 75 mL/hr for 16 hrs from 18:00 to 10:00.  Provides 1200 mL, 1800 kcal (100%), 82 gm protein (100%), 13 gm CHO/hr during cycle, 100% RDI for vitamins/minerals.    Tube Feed Monitoring:  TF M&E: Weigh patient twice weekly  Monitor serum electrolytes, Cr, BUN, Mg, Phos, Ca/iCa daily (until stable)    Hydration:   --> Recommend free water flush of 217m q 12hours  --> TF + free water provides 16055mtotal volume       Vitamins/Minerals  Discontinue ergocalciferol, ordered for daily:   --> Vitamin D (cholecalciferol, 2000 units, daily) FT/PO.  Continue as ordered:   --> MVI, B1G95--> folic acid and thiamin per primary team      Nutrition Related Medication Recommendations:   Other Nutrition Related Medication: Continue loperamide prn    Nutrition Monitoring/Evaluation:  Nutrition Goal Indicator: Energy intake  Nutrition Goal Criteria: TF to meet >75% estimated energy needs  Nutrition Goal Progress: Goal achieved  Nutrition Goal 2 Indicator: Macronutrient intake  Nutrition Goal 2 Criteria: TF to meet >75% estimated protein needs  Nutrition Goal 2 Progress: Goal achieved  Nutrition Goal 3 Indicator: Food intake  Nutrition Goal 3 Criteria: Patient consuming >50% of 1 meal + 1 ONS  per day  Nutrition Goal 3 Progress: Some digression away from goal    MaGlenice LaineRD

## 2022-05-14 NOTE — Plan of Care (Signed)
Problem: Discharge Planning - Adult  Goal: Knowledge of and participation in plan of care  Outcome: Progress within 12 hours     Problem: Fall, at Risk or Actual - Adult  Goal: Absence of falls and fall related injury  Outcome: Progress within 12 hours     Problem: Delirium - Adult / Pediatric  Goal: Absence or resolution of delirium  Outcome: Progress within 12 hours     Problem: Nutrition, Alteration in -Adult  Goal: Adequate nutritional intake  Outcome: Progress within 12 hours  Goal: Maximize nutritional intake per patient condition  Outcome: Progress within 12 hours     Problem: Pressure Injury,At Risk and Actual- Adult  Goal: Absence of any new pressure injury  Outcome: Progress within 12 hours  Goal: Pressure injury healing/stabilization  Outcome: Progress within 12 hours  Goal: Participation in preventative efforts and treatment plan (Patient/Family/Caregiver)  Outcome: Progress within 12 hours  Goal: Adequate nutritional intake  Outcome: Progress within 12 hours     Problem: GI Elimination Impaired - Adult  Goal: Passage of soft, formed stool  Outcome: Progress within 12 hours  Goal: Stool elimination per clinical condition ( e.g. GI ostomies )  Outcome: Progress within 12 hours

## 2022-05-15 LAB — COMPLETE BLOOD COUNT WITH DIFFERENTIAL
Abs Basophils: 0.02 10*9/L (ref 0.00–0.10)
Abs Eosinophils: 0.07 10*9/L (ref 0.00–0.40)
Abs Imm Granulocytes: 0.06 10*9/L (ref <0.10)
Abs Lymphocytes: 0.94 10*9/L — ABNORMAL LOW (ref 1.00–3.40)
Abs Monocytes: 0.77 10*9/L (ref 0.20–0.80)
Abs Neutrophils: 4.26 10*9/L (ref 1.80–6.80)
Hematocrit: 28.1 % — ABNORMAL LOW (ref 36.0–46.0)
Hemoglobin: 8.4 g/dL — ABNORMAL LOW (ref 12.0–15.5)
MCH: 32.4 pg (ref 26.0–34.0)
MCHC: 29.9 g/dL — ABNORMAL LOW (ref 31.0–36.0)
MCV: 109 fL — ABNORMAL HIGH (ref 80–100)
MPV: 12.5 fL (ref 9.1–12.6)
Platelet Count: 153 10*9/L (ref 140–450)
RBC Count: 2.59 10*12/L — ABNORMAL LOW (ref 4.00–5.20)
RDW-CV: 17.1 % — ABNORMAL HIGH (ref 11.7–14.4)
WBC Count: 6.1 10*9/L (ref 3.4–10.0)

## 2022-05-15 LAB — ALANINE TRANSAMINASE: Alanine transaminase: 17 U/L (ref 10–61)

## 2022-05-15 LAB — BILIRUBIN, TOTAL: Bilirubin, Total: 0.6 mg/dL (ref 0.2–1.2)

## 2022-05-15 LAB — BASIC METABOLIC PANEL (NA, K, CL, CO2, BUN, CR, GLU, CA)
Anion Gap: 7 (ref 4–14)
Calcium, total, Serum / Plasma: 8.1 mg/dL — ABNORMAL LOW (ref 8.4–10.5)
Carbon Dioxide, Total: 30 mmol/L — ABNORMAL HIGH (ref 22–29)
Chloride, Serum / Plasma: 100 mmol/L — ABNORMAL LOW (ref 101–110)
Creatinine: 0.69 mg/dL (ref 0.55–1.02)
Glucose, non-fasting: 89 mg/dL (ref 70–199)
Potassium, Serum / Plasma: 4.3 mmol/L (ref 3.5–5.0)
Sodium, Serum / Plasma: 137 mmol/L (ref 135–145)
Urea Nitrogen, Serum / Plasma: 27 mg/dL — ABNORMAL HIGH (ref 7–25)
eGFRcr: 98 mL/min/{1.73_m2} (ref >59)

## 2022-05-15 LAB — ALKALINE PHOSPHATASE: Alkaline Phosphatase: 186 U/L — ABNORMAL HIGH (ref 38–108)

## 2022-05-15 LAB — MAGNESIUM, SERUM / PLASMA: Magnesium, Serum / Plasma: 2.1 mg/dL (ref 1.6–2.6)

## 2022-05-15 LAB — PHOSPHORUS, SERUM / PLASMA: Phosphorus, Serum / Plasma: 3.3 mg/dL (ref 2.3–4.7)

## 2022-05-15 LAB — ASPARTATE TRANSAMINASE: AST: 40 U/L (ref 5–44)

## 2022-05-15 MED FILL — ONDANSETRON HCL 4 MG TABLET: 4 mg | ORAL | Qty: 2

## 2022-05-15 MED FILL — MIRTAZAPINE 30 MG TABLET: 30 mg | ORAL | Qty: 1

## 2022-05-15 MED FILL — MAG-AL PLUS 200 MG-200 MG-20 MG/5 ML ORAL SUSPENSION: 200-200-20 mg/5 mL | ORAL | Qty: 30

## 2022-05-15 MED FILL — FOLIC ACID 1 MG TABLET: 1 mg | ORAL | Qty: 1

## 2022-05-15 MED FILL — HYDROXYZINE HCL 25 MG TABLET: 25 mg | ORAL | Qty: 1

## 2022-05-15 MED FILL — CHOLECALCIFEROL (VITAMIN D3) 25 MCG (1,000 UNIT) TABLET: 1000 UNITS | ORAL | Qty: 2

## 2022-05-15 MED FILL — THERA M PLUS (FERROUS FUMARATE) 9 MG IRON-400 MCG TABLET: 9 mg iron-400 mcg | ORAL | Qty: 1

## 2022-05-15 MED FILL — URSODIOL (BULK) 100 % POWDER: 100 % | Qty: 0.6

## 2022-05-15 MED FILL — SPIRONOLACTONE 100 MG TABLET: 100 mg | ORAL | Qty: 1

## 2022-05-15 MED FILL — PREVYMIS 480 MG TABLET: 480 mg | ORAL | Qty: 1

## 2022-05-15 MED FILL — THIAMINE MONONITRATE (VITAMIN B1) 100 MG TABLET: 100 mg | ORAL | Qty: 1

## 2022-05-15 MED FILL — ANTI-DIARRHEAL (LOPERAMIDE) 2 MG TABLET: 2 mg | ORAL | Qty: 2

## 2022-05-15 MED FILL — XIFAXAN 550 MG TABLET: 550 mg | ORAL | Qty: 1

## 2022-05-15 MED FILL — CYANOCOBALAMIN (VIT B-12) 1,000 MCG/ML INJECTION SOLUTION: 1000 mcg/mL | INTRAMUSCULAR | Qty: 1

## 2022-05-15 MED FILL — HYDROCODONE 5 MG-ACETAMINOPHEN 325 MG TABLET: 5-325 mg | ORAL | Qty: 2

## 2022-05-15 MED FILL — FUROSEMIDE 20 MG TABLET: 20 mg | ORAL | Qty: 2

## 2022-05-15 MED FILL — LANSOPRAZOLE 3 MG/ML ORAL SUSPENSION COMPOUNDING KIT: 3 mg/mL | ORAL | Qty: 10

## 2022-05-15 NOTE — Progress Notes (Signed)
[facility] PROGRESS NOTE     This is an independent service.   The available consultant for this service is Alena Bills, MD.             24 Hour Course  - HDS, afebrile  - Bmx4 - still early to further adjust anti-diarrheals, but may consider QID dosing loperamide  - Small emesis x 1 while staffing cleaning patient up - TF residuals normal     Subjective  Sitting up in bed - overall reports feeling sad and down today- became tearful.  I opened her room blinds to offer sunlight.  Encouraged her to sit up.  She has intermittent abdominal cramping, but not new.  Denies nausea.  Denies pain    Vitals  Temp:  [36.4 C (97.6 F)-37.1 C (98.8 F)] 37.1 C (98.8 F)  Heart Rate:  [81-85] 85  *Resp:  [16-18] 16  BP: (97-105)/(66-71) 99/71  SpO2:  [96 %-99 %] 96 %    MostRecent Weight: 67.4 kg (148 lb 9.4 oz)  Admission Weight: 66.2 kg (146 lb)      Intake/Output Summary (Last 24 hours) at 05/15/2022 1428  Last data filed at 05/15/2022 1038  Gross per 24 hour   Intake 1978 ml   Output 400 ml   Net 1578 ml       Physical Exam  Gen: NAD, appropriate and engaged, but tearful  HEENT: MMM, no oral lesions, sclera non-icteric  Resp: CTAB, no rhonchi/wheezing  Card: RRR  Gi: distended, soft, non-tender, +bs x4  Ext: negative BLE edema   Skin: scattered ecchymosis to BUE  Neuro: Alert, oriented x3, no focal neuro deficits  Lines: PIV x1 in place, clean dry & intact dressing    Scheduled Meds:   0.9% sodium chloride flush  3 mL Intravenous Q12H [place]    cholecalciferol (vitamin D3)  2,000 Units Per G Tube Daily [place]    cyanocobalamin (Vitamin B12)  1,000 mcg Subcutaneous Q7 Days    folic acid  1 mg Per G Tube Daily [place]    furosemide  40 mg Per G Tube Daily [place]    lansoprazole  30 mg Per G Tube Q AM Before Breakfast [facility]    letermovir  480 mg Other Daily [place]    loperamide  4 mg Feeding Tube Q8H    mirtazapine  30 mg Per G Tube Daily At Bedtime [facility]    multivitamin with iron-minerals-folic acid  1 tablet Feeding Tube  Daily SCH    ondansetron  8 mg Per G Tube TID    rifAXIMin  550 mg Feeding Tube BID SCH    simethicone  80 mg Oral 4x Daily [place]    spironolactone  100 mg Per G Tube Daily [place]    thiamine mononitrate (vit B1)  100 mg Per G Tube Daily [place]    ursodiol  600 mg Per G Tube BID [place]     Continuous Infusions:  PRN Meds:   0.9% sodium chloride flush  3 mL Intravenous PRN    acetaminophen  650 mg Per G Tube Q8H PRN    albuterol  2.5 mg Nebulization Q4H PRN    Or    albuterol  2.5 mg Nebulization Q4H PRN    aluminum-magnesium hydroxide-simethicone  15 mL Per G Tube Q4H PRN    HYDROcodone-acetaminophen  1-2 tablet Per G Tube Q6H PRN    HYDROmorphone  1 mg Oral Q4H PRN    hydrOXYzine  25 mg Feeding Tube  Q4H PRN    opium  6 mg Oral 4x Daily PRN    prochlorperazine  5 mg Intravenous Q8H PRN    Or    prochlorperazine  5 mg Per G Tube Q8H PRN       Data    CBC        05/15/22  0544 05/14/22  0606   WBC 6.1 5.8   HGB 8.4* 8.2*   HCT 28.1* 27.2*   PLT 153 155       Coags  No results found in last 36 hours      Chem7        05/15/22  0544 05/14/22  0606   NA 137 137   K 4.3 4.1   CL 100* 102   CO2 30* 31*   BUN 27* 30*   CREAT 0.69 0.64   GLU 89 118       Electrolytes        05/15/22  0544 05/14/22  0606   CA 8.1* 8.1*   MG 2.1 2.1   PO4 3.3 3.3       Liver Panel        05/15/22  0544 05/14/22  0606   AST 40 41   ALT 17 18   ALKP 186* 186*   TBILI 0.6 0.6       UA  No results found in last 7 days    Microscopy:  No results found in last 7 days    Microbiology Results (last 72 hours)       Procedure Component Value Units Date/Time    Bacterial Culture, Normally Sterile Sites, With Gram Stain [[id number]] Collected: 05/11/22 1115    Order Status: Completed Specimen: Not Applicable from Peritoneal Fluid Updated: 05/15/22 0929     Gram Stain Rare Host cells (not PMNs, RBCs, or squamous epithelial cells) , No PMNs seen      No organisms seen     Bacterial Culture, Sterile Sites No growth 4 days.    Cytomegalovirus DNA, Quantitative PCR,  Plasma [[id number]]  (Abnormal) Collected: 05/11/22 0620    Order Status: Completed Specimen: Blood from Serum Updated: 05/12/22 1514     CMV DNA (IU/mL) 44 IU/mL      Comment: *SUBCRITICAL*  Performed by Real-Time PCR  Note new reference range.          CMV DNA (Log IU/mL) 1.64     Comment: Log10 IU/mL  Note new reference range.               Radiology Results  No results found.    I discussed the patient with Johnney Ou* from [facility] regarding A&P.    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  2011: VRd bortezomib, Lenalidomide, Dexamethasone  04/2011: Auto SCT  07/2014: Rd - revlimid 15 mg every other day, dexamethasone 8 mg PO weekly.   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   Receiving dexamethasone 12 mg weekly for diarrhea as outpt  10/16/21 - last Daratumumab dose (on hold due to diarrhea)  DATA:   09/07/2010: IgG Kappa MM  12/02/2021: Ig M <5, IgG 447, IgA 20, Ig E <2, SFLC Kappa 16.6, Lambda 1.6, Kappa/Lamda 10.38  12/14/21: IgG K on IFE, IgG 430, KLC 16.6, LLC 2.7, KLR 6.15  02/23/22 SPEP with stable M-spike, SFLC decreased Kappa 44.2 / Lambda 5.5 = K/L ratio = 8.04 (overall downtrending still)  04/23/22 M-spike 0.6, IgG K on IFE, IgG 997, Kappa 70.7/Lambda 19.8 = K/L ratio = 3.57  Monitor MM labs monthly per Dr Jacelyn Grip, next due 7/9     Chemo:  TBD, therapy held since 10/2021 due to recurrent infections and now deconditioning  Not currently a candidate due to significant deconditioning, may be candidate in the future if improved.     Supportive Care:   Transfuse for Hgb <7 and Platelets <10  Access: PIV      Primary Oncologist:   Dr. Jacelyn Grip ([place]), will transfer to Dr. Hassell Done ()  Dr. Iona Coach ([place]den)     Disposition:  Lives in [place] - anticipate discharge to [place] insurance athorization per [facility] 6/29  '[ ]'$  will need paracentesis q7-10 days coordinated outpatient with IR and transportation arranged with family       # Immunocompromised  # Diarrhea - controlled  # c/b CMV Viremia  # Hx prolonged norovirus infection  Immunocompromised due to underlying  malignancy, not currently neutropenic. Recent extended hospitalization at both [place] 1/23-04/16/22, with hypovolemic shock associated with norovirus, CMV colitis, acute liver failure associated with pomylast (see ALF section).  Currently admitted for ongoing diarrhea and FTT in brief time at home after dc from [facility], initially began in 8/22, recently managed at home w/ loperamide BID w/ 2-3 watery stools daily. Diarrhea worsened on 6/12 (11 stools). Possibly r/t to inadequate dosing of loperamide vs infectious etiology. GI c/s 6/13 for possible scope - per patient and family declined endoscopy at this time. ID consultation deferred as infectious w/u negative.   Given CMV improving, norovirus negative, and enteric panel unrevealing - favor non-infectious etiologies for diarrhea. ID has requested we maintain enteric precautions for hospitalization d/t prolonged prior GI infections.   Diarrhea ~4 stools/day as of 7/1    Data  - Norovirus  - First +norovirus 08/2021 with persistent +norovirus s/p 7-day courses x 2 nitazoxanide (2/16-02/22/23 & 01/2022). Continued to be norovirus positive (most recently on 04/07/22). Norovirus, cdiff, GI PCR neg this admission (6/9).   - CMV  -Regarding CMV colitis, 12/17/21 flex sig showed CMV colitis, sp multiple cycles of gancyclovir IV, IVIG, valcyte 900 mg PO BID, then ultimately transitioned to letermovir, which she continues. 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   # Hepatic Encephalopathy  Steady rise in LFTs began 05/2021. Acute rise in 11/2021,coinciding with acute CMV viremia but liver  biopsy CMV stains negative (12/11/21). 12/14/21: Hepatitis/viral work-up negative in 11/2021. LP was done on 12/16/21 for hepatic encephalopathy w/ CMV CSF PCR negative. Suspected to be 2/2 pomalidamide, though liver path from 12/22/21 unlikely for vanishing bile duct syndrome.   Malnutrition may also be contributing to ascites (see below). Upon admission was more somnolent and one episode of possible visual hallucination reported on 6/23. Her mentation has improved throughout this hospitalization  As of 6/29 AxOx3    Dx:  - 6/17 KUB: mild ileus and density over abdomen (likely ascitic fluid) - not clinically significant as patient is having regular bowel movements and no PVR noted.  - 6/20 therapeutic paracentesis by TAPS, -4L + albumin  - 6/23 ammonia normal  - 6/27 paracentesis by TAPS -5L + albumin, SBP unrevealing or infection. cytology negative for malignancy    Plan:  -Continue spironolactone ('100mg'$ ) + lasix ('40mg'$ ) to minimize accumulation of ascites   (started 6/16, increased 6/19- )  Hold for SBP<90 or MAP<60  Will likely need ~q weekly therapeutic paracentesis inpt/outpt - will need to be arranged for d/c   -Continue Ursodiol 600 mg PO BID  -Continue rifaximin 550 mg PO BID  -Low sodium diet  -2L fluid  restriction  -Delirium precautions     # Failure to thrive  # PEG tube dependence  # Macrocytic anemia - B12 deficiency  PEG tube placed during last admission. Low B12 level but MMA normal, which is not consistent with B12 deficiency, however still treating at this time as below. Encouraged PO intake in addition to tube feeds. Transitioned to cyclic tube feeds on 0/63.  6/29 - tolerating cyclic TF and improving oral intake    Plan:  -Continue cyclic TF - Nutren 1.5 @ 75 mL x 16 hours (1800-1000) - TID residuals per RN protocol  -Continue Thera-M Plus MVI for copper deficiency (Copper = 47 on 6/16)  -Continue mirtazapine  '30mg'$  nightly (incr 6/18- ) per palliative recs  -Continue cyanocobalamin 1048mg SQ weekly (6/24- )   -s/p Cyanocobalamin 10082m SQ daily x7 days (6/17-6/23)  -s/p Zinc acetate 6/11-6/23)    # Dysphagia  Dysphagia and aspiration risk assessed by speech language pathology. SLP recommended Modified Barium Swallow (completed on 6/21) which showed moderate pharyngeal aspiration; SLP recommends regular diet w/ mildly thickened liquids.   6/22 - Pt and daughter request to thicken liquids themselves at bedside for more variety, understand risk of aspiration w/ thins. Liberalized diet per their request to regular.   Appreciate SLP recs.   - See failure to thrive section as well re: PEG tube and nutritional deficiencies     # Pain  # Pressure Injury (sacrum)  Pain primarily r/t pressure injuries though occasionally also d/t abdominal cramping. Wound healing likely limited d/t poor nutritional status (see above) and limited mobility (improving).  -Consider wound care RN c/s  -Consulted Palliative Care 6/17 for SMS, now s/o  -Continue hydrocodone-acetaminophen PRN  -Continue hydromorphone per G tube PRN     # Physical Deconditioning  Pt motivated to participate w/ PT/OT, deconditioned after prior extended hospitalization (as above). Showing marked improvements in endurance w/ static siting balance & bed  mobility.   - Continue PT/OT   - recommend SNF placement    #GOC  Pt is consistent and clear that she wishes to pursue full medical management of her ongoing medical problems, as above, with the goal of prolonging her life. She is motivated by her desire to spend time with her family. This  is consistent in conversations with [facility] (6/18) & Dr. Jacelyn Grip (6/21).     VTE PPx:  Contraindicated: Anticipated procedure    Severity of Illness  Stable    Nutritional Assessment  Severe Chronic disease or condition related malnutrition    During this hospitalization the patient is also being treated for:  - Anemia associated with malignancy (present on admission)    - Thrombocytopenia (present on admission)    - Coagulopathy (present on admission)    - Malignancy associated fatigue on admission  - Neoplasm/malignancy associated pain  - Hypoalbuminemia (present on admission)  - HYPOnatremia (present on admission)  - Fluid status: Volume overload on admission  - Cachexia  - Acute liver failure, present on admission    Code Status: FULL        I spent a total time of 50 minutes in this episode of care preparing to see the patient, obtaining and/or reviewing separately obtained history, performing a medically appropriate examination and/or evaluation, counseling and educating patient/family/caregiver, ordering and reviewing medications, tests, or procedures, referring and communicating with other health care professionals, documenting clinical information in the electronic or other health record, and communicating results to the patient/family/caregiver, and/or other care coordination.    Gardiner Rhyme, NP  Hospital Identification #:  [id number]

## 2022-05-16 LAB — COMPLETE BLOOD COUNT WITH DIFFERENTIAL
Abs Basophils: 0.02 10*9/L (ref 0.00–0.10)
Abs Eosinophils: 0.05 10*9/L (ref 0.00–0.40)
Abs Imm Granulocytes: 0.1 10*9/L — ABNORMAL HIGH (ref <0.10)
Abs Lymphocytes: 0.7 10*9/L — ABNORMAL LOW (ref 1.00–3.40)
Abs Monocytes: 0.59 10*9/L (ref 0.20–0.80)
Abs Neutrophils: 3.34 10*9/L (ref 1.80–6.80)
Hematocrit: 25.5 % — ABNORMAL LOW (ref 36.0–46.0)
Hemoglobin: 7.9 g/dL — ABNORMAL LOW (ref 12.0–15.5)
MCH: 33.1 pg (ref 26.0–34.0)
MCHC: 31 g/dL (ref 31.0–36.0)
MCV: 107 fL — ABNORMAL HIGH (ref 80–100)
MPV: 12.7 fL — ABNORMAL HIGH (ref 9.1–12.6)
Platelet Count: 147 10*9/L (ref 140–450)
RBC Count: 2.39 10*12/L — ABNORMAL LOW (ref 4.00–5.20)
RDW-CV: 17.2 % — ABNORMAL HIGH (ref 11.7–14.4)
WBC Count: 4.8 10*9/L (ref 3.4–10.0)

## 2022-05-16 LAB — BASIC METABOLIC PANEL (NA, K, CL, CO2, BUN, CR, GLU, CA)
Anion Gap: 6 (ref 4–14)
Calcium, total, Serum / Plasma: 7.9 mg/dL — ABNORMAL LOW (ref 8.4–10.5)
Carbon Dioxide, Total: 30 mmol/L — ABNORMAL HIGH (ref 22–29)
Chloride, Serum / Plasma: 101 mmol/L (ref 101–110)
Creatinine: 0.63 mg/dL (ref 0.55–1.02)
Glucose, non-fasting: 111 mg/dL (ref 70–199)
Potassium, Serum / Plasma: 4.2 mmol/L (ref 3.5–5.0)
Sodium, Serum / Plasma: 137 mmol/L (ref 135–145)
Urea Nitrogen, Serum / Plasma: 28 mg/dL — ABNORMAL HIGH (ref 7–25)
eGFRcr: 100 mL/min/{1.73_m2} (ref >59)

## 2022-05-16 LAB — PHOSPHORUS, SERUM / PLASMA: Phosphorus, Serum / Plasma: 2.9 mg/dL (ref 2.3–4.7)

## 2022-05-16 LAB — MAGNESIUM, SERUM / PLASMA: Magnesium, Serum / Plasma: 2.1 mg/dL (ref 1.6–2.6)

## 2022-05-16 LAB — ALKALINE PHOSPHATASE: Alkaline Phosphatase: 169 U/L — ABNORMAL HIGH (ref 38–108)

## 2022-05-16 LAB — BILIRUBIN, TOTAL: Bilirubin, Total: 0.5 mg/dL (ref 0.2–1.2)

## 2022-05-16 LAB — ASPARTATE TRANSAMINASE: AST: 38 U/L (ref 5–44)

## 2022-05-16 LAB — ALANINE TRANSAMINASE: Alanine transaminase: 15 U/L (ref 10–61)

## 2022-05-16 MED FILL — URSODIOL (BULK) 100 % POWDER: 100 % | Qty: 0.6

## 2022-05-16 MED FILL — ONDANSETRON HCL 4 MG TABLET: 4 mg | ORAL | Qty: 2

## 2022-05-16 MED FILL — THERA M PLUS (FERROUS FUMARATE) 9 MG IRON-400 MCG TABLET: 9 mg iron-400 mcg | ORAL | Qty: 1

## 2022-05-16 MED FILL — FOLIC ACID 1 MG TABLET: 1 mg | ORAL | Qty: 1

## 2022-05-16 MED FILL — THIAMINE MONONITRATE (VITAMIN B1) 100 MG TABLET: 100 mg | ORAL | Qty: 1

## 2022-05-16 MED FILL — XIFAXAN 550 MG TABLET: 550 mg | ORAL | Qty: 1

## 2022-05-16 MED FILL — SIMETHICONE 40 MG/0.6 ML ORAL DROPS,SUSPENSION: 40 mg/0.6 mL | ORAL | Qty: 1.2

## 2022-05-16 MED FILL — HYDROXYZINE HCL 25 MG TABLET: 25 mg | ORAL | Qty: 1

## 2022-05-16 MED FILL — HYDROCODONE 5 MG-ACETAMINOPHEN 325 MG TABLET: 5-325 mg | ORAL | Qty: 2

## 2022-05-16 MED FILL — CHOLECALCIFEROL (VITAMIN D3) 25 MCG (1,000 UNIT) TABLET: 1000 UNITS | ORAL | Qty: 2

## 2022-05-16 MED FILL — ANTI-DIARRHEAL (LOPERAMIDE) 2 MG TABLET: 2 mg | ORAL | Qty: 2

## 2022-05-16 MED FILL — FUROSEMIDE 20 MG TABLET: 20 mg | ORAL | Qty: 2

## 2022-05-16 MED FILL — MAG-AL PLUS 200 MG-200 MG-20 MG/5 ML ORAL SUSPENSION: 200-200-20 mg/5 mL | ORAL | Qty: 30

## 2022-05-16 MED FILL — SPIRONOLACTONE 100 MG TABLET: 100 mg | ORAL | Qty: 1

## 2022-05-16 MED FILL — PREVYMIS 480 MG TABLET: 480 mg | ORAL | Qty: 1

## 2022-05-16 MED FILL — LANSOPRAZOLE 3 MG/ML ORAL SUSPENSION COMPOUNDING KIT: 3 mg/mL | ORAL | Qty: 10

## 2022-05-16 MED FILL — MIRTAZAPINE 30 MG TABLET: 30 mg | ORAL | Qty: 1

## 2022-05-16 NOTE — Plan of Care (Signed)
Problem: Fall, at Risk or Actual - Adult  Goal: Absence of falls and fall related injury  Outcome: Progress within 12 hours     Problem: Nutrition, Alteration in -Adult  Goal: Adequate nutritional intake  Outcome: Progress within 12 hours  Goal: Maximize nutritional intake per patient condition  Outcome: Progress within 12 hours     Problem: Pressure Injury,At Risk and Actual- Adult  Goal: Absence of any new pressure injury  Outcome: Progress within 12 hours  Goal: Pressure injury healing/stabilization  Outcome: Progress within 12 hours  Goal: Participation in preventative efforts and treatment plan (Patient/Family/Caregiver)  Outcome: Progress within 12 hours  Goal: Adequate nutritional intake  Outcome: Progress within 12 hours

## 2022-05-16 NOTE — Plan of Care (Signed)
Problem: Discharge Planning - Adult  Goal: Knowledge of and participation in plan of care  Outcome: Progress within 12 hours     Problem: Fall, at Risk or Actual - Adult  Goal: Absence of falls and fall related injury  Outcome: Progress within 12 hours     Problem: Delirium - Adult / Pediatric  Goal: Absence or resolution of delirium  Outcome: Progress within 12 hours     Problem: Nutrition, Alteration in -Adult  Goal: Adequate nutritional intake  Outcome: Progress within 12 hours  Goal: Maximize nutritional intake per patient condition  Outcome: Progress within 12 hours     Problem: Pressure Injury,At Risk and Actual- Adult  Goal: Absence of any new pressure injury  Outcome: Progress within 12 hours  Goal: Pressure injury healing/stabilization  Outcome: Progress within 12 hours  Goal: Participation in preventative efforts and treatment plan (Patient/Family/Caregiver)  Outcome: Progress within 12 hours  Goal: Adequate nutritional intake  Outcome: Progress within 12 hours     Problem: GI Elimination Impaired - Adult  Goal: Passage of soft, formed stool  Outcome: Progress within 12 hours  Goal: Stool elimination per clinical condition ( e.g. GI ostomies )  Outcome: Progress within 12 hours

## 2022-05-16 NOTE — Progress Notes (Signed)
[facility] PROGRESS NOTE     This is an independent service.   The available consultant for this service is Alena Bills, MD.             24 Hour Course  - HDS, afebrile  - BMx1 today, no change in loperamide  - C/s hepatology to help optimize management of ascites, appreciate recs  - Ordered RUQ u/s w/ doppler    Subjective  Sitting up in bed - Continues to have intermittent abdominal pain and cramping. Does not feel overly distended. No nausea. Has had some issues with PureWick but working during our conversation.     Vitals  Temp:  [37.1 C (98.8 F)-37.4 C (99.3 F)] 37.4 C (99.3 F)  Heart Rate:  [82-88] 82  *Resp:  [16-20] 19  BP: (94-106)/(63-82) 94/63  SpO2:  [95 %-100 %] 97 %    MostRecent Weight: 67.4 kg (148 lb 9.4 oz)  Admission Weight: 66.2 kg (146 lb)      Intake/Output Summary (Last 24 hours) at 05/16/2022 1047  Last data filed at 05/16/2022 1829  Gross per 24 hour   Intake 1183 ml   Output 200 ml   Net 983 ml       Physical Exam  Gen: NAD, appropriate and engaged, but tearful  HEENT: MMM, no oral lesions, sclera non-icteric  Resp: CTAB, no rhonchi/wheezing  Card: RRR  Gi: distended, soft, non-tender, +bs x4  Ext: negative BLE edema   Skin: scattered ecchymosis to BUE  Neuro: Alert, oriented x3, no focal neuro deficits  Lines: PIV x1 in place, clean dry & intact dressing    Scheduled Meds:   0.9% sodium chloride flush  3 mL Intravenous Q12H [place]    cholecalciferol (vitamin D3)  2,000 Units Per G Tube Daily [place]    cyanocobalamin (Vitamin B12)  1,000 mcg Subcutaneous Q7 Days    folic acid  1 mg Per G Tube Daily [place]    furosemide  40 mg Per G Tube Daily [place]    lansoprazole  30 mg Per G Tube Q AM Before Breakfast [facility]    letermovir  480 mg Other Daily [place]    loperamide  4 mg Feeding Tube Q8H    mirtazapine  30 mg Per G Tube Daily At Bedtime [facility]    multivitamin with iron-minerals-folic acid  1 tablet Feeding Tube Daily SCH    ondansetron  8 mg Per G Tube TID    rifAXIMin  550 mg Feeding  Tube BID SCH    simethicone  80 mg Oral 4x Daily [place]    spironolactone  100 mg Per G Tube Daily [place]    thiamine mononitrate (vit B1)  100 mg Per G Tube Daily [place]    ursodiol  600 mg Per G Tube BID [place]     Continuous Infusions:  PRN Meds:   0.9% sodium chloride flush  3 mL Intravenous PRN    acetaminophen  650 mg Per G Tube Q8H PRN    albuterol  2.5 mg Nebulization Q4H PRN    Or    albuterol  2.5 mg Nebulization Q4H PRN    aluminum-magnesium hydroxide-simethicone  15 mL Per G Tube Q4H PRN    HYDROcodone-acetaminophen  1-2 tablet Per G Tube Q6H PRN    HYDROmorphone  1 mg Oral Q4H PRN    hydrOXYzine  25 mg Feeding Tube Q4H PRN    opium  6 mg Oral 4x Daily PRN    prochlorperazine  5 mg Intravenous Q8H PRN    Or    prochlorperazine  5 mg Per G Tube Q8H PRN       Data    CBC        05/16/22  0539 05/15/22  0544   WBC 4.8 6.1   HGB 7.9* 8.4*   HCT 25.5* 28.1*   PLT 147 153       Coags  No results found in last 36 hours      Chem7        05/16/22  0539 05/15/22  0544   NA 137 137   K 4.2 4.3   CL 101 100*   CO2 30* 30*   BUN 28* 27*   CREAT 0.63 0.69   GLU 111 89       Electrolytes        05/16/22  0539 05/15/22  0544   CA 7.9* 8.1*   MG 2.1 2.1   PO4 2.9 3.3       Liver Panel        05/16/22  0539 05/15/22  0544   AST 38 40   ALT 15 17   ALKP 169* 186*   TBILI 0.5 0.6       UA  No results found in last 7 days    Microscopy:  No results found in last 7 days    Microbiology Results (last 72 hours)       Procedure Component Value Units Date/Time    Bacterial Culture, Normally Sterile Sites, With Gram Stain [[id number]] Collected: 05/11/22 1115    Order Status: Completed Specimen: Not Applicable from Peritoneal Fluid Updated: 05/15/22 0929     Gram Stain Rare Host cells (not PMNs, RBCs, or squamous epithelial cells) , No PMNs seen      No organisms seen     Bacterial Culture, Sterile Sites No growth 4 days.          Radiology Results  No results found.    I discussed the patient with Johnney Ou* from [facility] regarding  A&P.    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  2011: VRd bortezomib, Lenalidomide, Dexamethasone  04/2011: Auto SCT  07/2014: Rd - revlimid 15 mg every other day, dexamethasone 8 mg PO weekly.   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   Receiving dexamethasone 12 mg weekly for diarrhea as outpt  10/16/21 - last Daratumumab dose (on hold due to diarrhea)     DATA:   09/07/2010: IgG Kappa MM  12/02/2021: Ig M <5, IgG 447, IgA 20, Ig E <2, SFLC Kappa 16.6, Lambda 1.6, Kappa/Lamda 10.38  12/14/21: IgG K on IFE, IgG 430, KLC 16.6, LLC 2.7, KLR 6.15  02/23/22 SPEP with stable M-spike, SFLC decreased Kappa 44.2 / Lambda 5.5 = K/L ratio = 8.04 (overall downtrending still)  04/23/22 M-spike 0.6, IgG K on IFE, IgG 997, Kappa 70.7/Lambda 19.8 = K/L ratio = 3.57  Monitor MM labs monthly per Dr Jacelyn Grip, next due 7/9     Chemo:  TBD, therapy held since 10/2021 due  to recurrent infections and now deconditioning  Not currently a candidate due to significant deconditioning, may be candidate in the future if improved.     Supportive Care:   Transfuse for Hgb <7 and Platelets <10  Access: PIV     Primary Oncologist:   Dr. Jacelyn Grip ([place]), will transfer to Dr. Hassell Done ([place])  Dr. Iona Coach ([place])     Disposition:  Lives in [place] - anticipate discharge to [employer] insurance athorization per [facility] 6/29  '[ ]'$  will need paracentesis q7-10 days coordinated outpatient with IR and transportation arranged with family       # Immunocompromised  # Diarrhea - controlled  # c/b CMV Viremia  # Hx prolonged norovirus  infection  Immunocompromised due to underlying  malignancy, not currently neutropenic. Recent extended hospitalization at both [place] 1/23-04/16/22, with hypovolemic shock associated with norovirus, CMV colitis, acute liver failure associated with pomylast (see ALF section).  Currently admitted for ongoing diarrhea and FTT in brief time at home after dc from [facility], initially began in 8/22, recently managed at home w/ loperamide BID w/ 2-3 watery stools daily. Diarrhea worsened on 6/12 (11 stools). Possibly r/t to inadequate dosing of loperamide vs infectious etiology. GI c/s 6/13 for possible scope - per patient and family declined endoscopy at this time. ID consultation deferred as infectious w/u negative.   Given CMV improving, norovirus negative, and enteric panel unrevealing - favor non-infectious etiologies for diarrhea. ID has requested we maintain enteric precautions for hospitalization d/t prolonged prior GI infections.   Diarrhea ~4 stools/day as of 7/1, improved on 7/2.     Data  - Norovirus  - First +norovirus 08/2021 with persistent +norovirus s/p 7-day courses x 2 nitazoxanide (2/16-02/22/23 & 01/2022). Continued to be norovirus positive (most recently on 04/07/22). Norovirus, cdiff, GI PCR neg this admission (6/9).   - CMV  -Regarding CMV colitis, 12/17/21 flex sig showed CMV colitis, sp multiple cycles of gancyclovir IV, IVIG, valcyte 900 mg PO BID, then ultimately transitioned to letermovir, which she continues. 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   # Hepatic Encephalopathy  Steady rise in LFTs began 05/2021. Acute rise in 11/2021,coinciding with acute CMV viremia but liver biopsy CMV stains negative (12/11/21). 12/14/21: Hepatitis/viral work-up negative in 11/2021. LP was done on 12/16/21 for hepatic encephalopathy w/ CMV CSF PCR negative. Suspected to be 2/2 pomalidamide, though liver path from 12/22/21 unlikely for vanishing bile duct syndrome.   Malnutrition may also be contributing to ascites (see below). Upon admission was more somnolent and one episode of possible visual hallucination reported on 6/23. Her mentation has improved throughout this hospitalization  As of 7/2 AxOx3, anticipate repeat para prior to discharging to SNF. Non-urgent c/s Hepatology 7/2 regarding optimizing medical management and improving ascites.  Dx:  - 6/17 KUB: mild ileus and density over abdomen (likely ascitic fluid) - not clinically significant as patient is having regular bowel movements and no PVR noted.  - 6/20 therapeutic paracentesis by TAPS, -4L + albumin  - 6/23 ammonia normal  - 6/27 paracentesis by TAPS -5L + albumin, SBP unrevealing or infection. cytology negative for malignancy  - 7/2 RUQ u/s w/ doppler: pending '[ ]'$      Plan:  -Appreciate hepatology recs:  Recommended u/s w/ doppler and anticipate optimizing diuresis, formal recs to follow  -Continue spironolactone ('100mg'$ ) + lasix ('40mg'$ ) to minimize accumulation of ascites   (started 6/16, increased 6/19- )  Hold for SBP<90 or MAP<60  Will likely need ~q 7-10d therapeutic paracentesis inpt/outpt - will need to be arranged for d/c   -Continue Ursodiol 600 mg PO BID  -Continue rifaximin 550 mg PO BID  -Low sodium diet  -2L fluid restriction  -Delirium precautions     # Failure to thrive  # PEG tube dependence  # Macrocytic anemia - B12 deficiency  PEG tube  placed during last admission. Low B12 level but MMA normal, which is not consistent with B12 deficiency, however still treating at this time as below. Encouraged PO intake in addition to tube feeds. Transitioned to cyclic tube feeds on 0/93.  6/29 - tolerating cyclic TF and improving oral intake    Plan:  -Continue cyclic TF - Nutren 1.5 @ 75 mL x 16 hours (1800-1000) - TID residuals per RN protocol  -Continue Thera-M Plus MVI for copper deficiency (Copper = 47 on 6/16)  -Continue mirtazapine  '30mg'$  nightly (incr 6/18- ) per palliative recs  -Continue cyanocobalamin 1064mg SQ weekly (6/24- )   -s/p Cyanocobalamin 10042m SQ daily x7 days (6/17-6/23)  -s/p Zinc acetate 6/11-6/23)    # Dysphagia  Dysphagia and aspiration risk assessed by speech language pathology. SLP recommended Modified Barium Swallow (completed on 6/21) which showed moderate pharyngeal aspiration; SLP recommends regular diet w/ mildly thickened liquids.   6/22 - Pt and daughter request to thicken liquids themselves at bedside for more variety, understand risk of aspiration w/ thins. Liberalized diet per their request to regular.   Appreciate SLP recs.   - See failure to thrive section as well re: PEG tube and nutritional deficiencies     # Pain  # Pressure Injury (sacrum)  Pain primarily r/t pressure injuries though occasionally also d/t abdominal cramping. Wound healing likely limited d/t poor nutritional status (see above) and limited mobility (improving).  -Consider wound care RN c/s  -Consulted Palliative Care 6/17 for SMS, now s/o  -Continue hydrocodone-acetaminophen PRN  -Continue hydromorphone per G tube PRN     # Physical Deconditioning  Pt motivated to participate w/ PT/OT, deconditioned after prior extended hospitalization (as above). Showing marked improvements in endurance w/ static siting balance & bed mobility.   - Continue PT/OT   - recommend SNF placement    #GOC  Pt is consistent and clear that she wishes to pursue full medical  management of her ongoing medical problems, as above, with the goal of prolonging her life. She is motivated by her desire to spend time with her family. This is consistent in conversations with [facility] (6/18) & Dr. WoJacelyn Grip6/21).     VTE PPx:  Contraindicated: Anticipated procedure    Severity of Illness  Stable    Nutritional Assessment  Severe Chronic disease or condition related malnutrition    During this hospitalization the patient is also being  treated for:  - Anemia associated with malignancy (present on admission)    - Thrombocytopenia (present on admission)    - Coagulopathy (present on admission)    - Malignancy associated fatigue on admission  - Neoplasm/malignancy associated pain  - Hypoalbuminemia (present on admission)  - HYPOnatremia (present on admission)  - Fluid status: Volume overload on admission  - Cachexia  - Acute liver failure, present on admission    Code Status: FULL            I spent a total time of 60 minutes in this episode of care preparing to see the patient, obtaining and/or reviewing separately obtained history, performing a medically appropriate examination and/or evaluation, counseling and educating patient/family/caregiver, ordering and reviewing medications, tests, or procedures, referring and communicating with other health care professionals, documenting clinical information in the electronic or other health record, and communicating results to the patient/family/caregiver, and/or other care coordination.    Evangeline Gula, NP  Hematology-Oncology BMT/CT  Pager: [phone]  Cell: [phone]  05/16/22  2:06 PM

## 2022-05-17 LAB — PROTHROMBIN TIME
INR: 1.2 (ref 0.9–1.2)
PT: 15.1 s — ABNORMAL HIGH (ref 11.6–15.0)

## 2022-05-17 LAB — COMPLETE BLOOD COUNT WITH DIFFERENTIAL
Abs Basophils: 0.02 10*9/L (ref 0.00–0.10)
Abs Eosinophils: 0.05 10*9/L (ref 0.00–0.40)
Abs Imm Granulocytes: 0.08 10*9/L (ref <0.10)
Abs Lymphocytes: 0.79 10*9/L — ABNORMAL LOW (ref 1.00–3.40)
Abs Monocytes: 0.59 10*9/L (ref 0.20–0.80)
Abs Neutrophils: 3.58 10*9/L (ref 1.80–6.80)
Hematocrit: 27.7 % — ABNORMAL LOW (ref 36.0–46.0)
Hemoglobin: 8.4 g/dL — ABNORMAL LOW (ref 12.0–15.5)
MCH: 31.8 pg (ref 26.0–34.0)
MCHC: 30.3 g/dL — ABNORMAL LOW (ref 31.0–36.0)
MCV: 105 fL — ABNORMAL HIGH (ref 80–100)
MPV: 12.7 fL — ABNORMAL HIGH (ref 9.1–12.6)
Platelet Count: 160 10*9/L (ref 140–450)
RBC Count: 2.64 10*12/L — ABNORMAL LOW (ref 4.00–5.20)
RDW-CV: 17.2 % — ABNORMAL HIGH (ref 11.7–14.4)
WBC Count: 5.1 10*9/L (ref 3.4–10.0)

## 2022-05-17 LAB — ALBUMIN, SERUM / PLASMA: Albumin, Serum / Plasma: 2 g/dL — ABNORMAL LOW (ref 3.4–4.8)

## 2022-05-17 LAB — BASIC METABOLIC PANEL (NA, K, CL, CO2, BUN, CR, GLU, CA)
Anion Gap: 6 (ref 4–14)
Calcium, total, Serum / Plasma: 7.9 mg/dL — ABNORMAL LOW (ref 8.4–10.5)
Carbon Dioxide, Total: 29 mmol/L (ref 22–29)
Chloride, Serum / Plasma: 101 mmol/L (ref 101–110)
Creatinine: 0.62 mg/dL (ref 0.55–1.02)
Glucose, non-fasting: 115 mg/dL (ref 70–199)
Potassium, Serum / Plasma: 4.4 mmol/L (ref 3.5–5.0)
Sodium, Serum / Plasma: 136 mmol/L (ref 135–145)
Urea Nitrogen, Serum / Plasma: 27 mg/dL — ABNORMAL HIGH (ref 7–25)
eGFRcr: 101 mL/min/{1.73_m2} (ref >59)

## 2022-05-17 LAB — ALANINE TRANSAMINASE: Alanine transaminase: 18 U/L (ref 10–61)

## 2022-05-17 LAB — MAGNESIUM, SERUM / PLASMA: Magnesium, Serum / Plasma: 2.1 mg/dL (ref 1.6–2.6)

## 2022-05-17 LAB — ASPARTATE TRANSAMINASE: AST: 50 U/L — ABNORMAL HIGH (ref 5–44)

## 2022-05-17 LAB — PHOSPHORUS, SERUM / PLASMA: Phosphorus, Serum / Plasma: 3 mg/dL (ref 2.3–4.7)

## 2022-05-17 LAB — BILIRUBIN, TOTAL: Bilirubin, Total: 0.6 mg/dL (ref 0.2–1.2)

## 2022-05-17 LAB — ALKALINE PHOSPHATASE: Alkaline Phosphatase: 181 U/L — ABNORMAL HIGH (ref 38–108)

## 2022-05-17 MED ORDER — FUROSEMIDE 20 MG TABLET
20 mg | Freq: Every day | ORAL | Status: DC
Start: 2022-05-17 — End: 2022-05-27
  Administered 2022-05-18 – 2022-05-27 (×10): via GASTROSTOMY

## 2022-05-17 MED ORDER — SPIRONOLACTONE 25 MG TABLET
25 mg | Freq: Every day | ORAL | Status: DC
Start: 2022-05-17 — End: 2022-05-27
  Administered 2022-05-18 – 2022-05-21 (×4): 150 mg via GASTROSTOMY
  Administered 2022-05-22 – 2022-05-23 (×2): via GASTROSTOMY
  Administered 2022-05-24 – 2022-05-25 (×2): 150 mg via GASTROSTOMY
  Administered 2022-05-26: 17:00:00 via GASTROSTOMY
  Administered 2022-05-27: 15:00:00 150 mg via GASTROSTOMY

## 2022-05-17 MED FILL — HYDROXYZINE HCL 25 MG TABLET: 25 mg | ORAL | Qty: 1

## 2022-05-17 MED FILL — HYDROMORPHONE 2 MG TABLET: 2 mg | ORAL | Qty: 1

## 2022-05-17 MED FILL — PREVYMIS 480 MG TABLET: 480 mg | ORAL | Qty: 1

## 2022-05-17 MED FILL — FOLIC ACID 1 MG TABLET: 1 mg | ORAL | Qty: 1

## 2022-05-17 MED FILL — CHOLECALCIFEROL (VITAMIN D3) 25 MCG (1,000 UNIT) TABLET: 1000 UNITS | ORAL | Qty: 2

## 2022-05-17 MED FILL — SPIRONOLACTONE 100 MG TABLET: 100 mg | ORAL | Qty: 1

## 2022-05-17 MED FILL — ANTI-DIARRHEAL (LOPERAMIDE) 2 MG TABLET: 2 mg | ORAL | Qty: 2

## 2022-05-17 MED FILL — HYDROCODONE 5 MG-ACETAMINOPHEN 325 MG TABLET: 5-325 mg | ORAL | Qty: 2

## 2022-05-17 MED FILL — ONDANSETRON HCL 4 MG TABLET: 4 mg | ORAL | Qty: 2

## 2022-05-17 MED FILL — SIMETHICONE 40 MG/0.6 ML ORAL DROPS,SUSPENSION: 40 mg/0.6 mL | ORAL | Qty: 1.2

## 2022-05-17 MED FILL — MAG-AL PLUS 200 MG-200 MG-20 MG/5 ML ORAL SUSPENSION: 200-200-20 mg/5 mL | ORAL | Qty: 30

## 2022-05-17 MED FILL — URSODIOL (BULK) 100 % POWDER: 100 % | Qty: 0.6

## 2022-05-17 MED FILL — PROCHLORPERAZINE EDISYLATE 10 MG/2 ML (5 MG/ML) INJECTION SOLUTION: 10 mg/2 mL (5 mg/mL) | INTRAMUSCULAR | Qty: 2

## 2022-05-17 MED FILL — XIFAXAN 550 MG TABLET: 550 mg | ORAL | Qty: 1

## 2022-05-17 MED FILL — FUROSEMIDE 20 MG TABLET: 20 mg | ORAL | Qty: 2

## 2022-05-17 MED FILL — THIAMINE MONONITRATE (VITAMIN B1) 100 MG TABLET: 100 mg | ORAL | Qty: 1

## 2022-05-17 MED FILL — LANSOPRAZOLE 3 MG/ML ORAL SUSPENSION COMPOUNDING KIT: 3 mg/mL | ORAL | Qty: 10

## 2022-05-17 MED FILL — MIRTAZAPINE 30 MG TABLET: 30 mg | ORAL | Qty: 1

## 2022-05-17 MED FILL — THERA M PLUS (FERROUS FUMARATE) 9 MG IRON-400 MCG TABLET: 9 mg iron-400 mcg | ORAL | Qty: 1

## 2022-05-17 NOTE — Plan of Care (Signed)
Problem: Discharge Planning - Adult  Goal: Knowledge of and participation in plan of care  Outcome: Progress within 12 hours     Problem: Fall, at Risk or Actual - Adult  Goal: Absence of falls and fall related injury  Outcome: Progress within 12 hours     Problem: Delirium - Adult / Pediatric  Goal: Absence or resolution of delirium  Outcome: Progress within 12 hours     Problem: Nutrition, Alteration in -Adult  Goal: Adequate nutritional intake  Outcome: Progress within 12 hours  Goal: Maximize nutritional intake per patient condition  Outcome: Progress within 12 hours     Problem: Pressure Injury,At Risk and Actual- Adult  Goal: Absence of any new pressure injury  Outcome: Progress within 12 hours  Goal: Pressure injury healing/stabilization  Outcome: Progress within 12 hours  Goal: Participation in preventative efforts and treatment plan (Patient/Family/Caregiver)  Outcome: Progress within 12 hours  Goal: Adequate nutritional intake  Outcome: Progress within 12 hours     Problem: GI Elimination Impaired - Adult  Goal: Passage of soft, formed stool  Outcome: Progress within 12 hours  Goal: Stool elimination per clinical condition ( e.g. GI ostomies )  Outcome: Progress within 12 hours

## 2022-05-17 NOTE — Consults (Signed)
Hepatology Initial Consultation Note     Consult question: management of ascites  Consulting service: heme/bmt/cell  Consulting attending: seshadri    History of Present Illness:   [name] is a 61 year old man with IgG kappa multiple myeloma in 08/2010 with lytic spine lesions s/p autologous stem cell transplant in 2012 s/p daratumumab/pomalyst/dexamethasone (last 10/2021 due to diarrhea), PE s/p apixaban, hereditary angioedema, chronic GERD, MDD/GAD, chronic diarrhea w/ norovirus (09/2021 c/b hypovolemic shock), and history of acute liver injury s/p transjugular liver biopsy in 11/2021 with mild elevated HVPG 8 mmHg c/f drug-induced liver injury in the setting of NASH, now with ongoing ascites requiring weekly LVP in the setting of protein caloric malnutrition s/p PEG-dependent on tube feeds. Hepatology consulted for further management of her ascites.     History is notable for severe diarrhea secondary to norovirus (treated with nitazoxanide x 2) complicated by hypovolemic shock and acute liver injury in 12/02/2021, transferred to [place] with prolonged hospital course (1/25-01/15/2022). Her transjugular liver biopsy on 12/11/21 showed mild HVPG of 8 mmHg and steatohepatitis with pericentral/sinusoidal and periportal fibrosis, along with ductal reaction with focal pericholangitis, suggestive of drug-induced liver injury in the setting of underlying NASH with possible culprit including thalidomide derivatives (pomalidomide) and dexamethasone (which can exacerbation of pre-existing NASH). Flexible sigmoidoscopy on 12/17/2021 showed CMV colitis and was treated with ganciclovir followed by letemovir due to bone marrow suppression.     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     Her ascites fluid studies is notable for high SAAG >1.1 and low protein <1.5 suggestive of portal hypertension (mild elevated HVPG of 8 mmHg [WHVP 16 mmHg - FHVP 8 mmHg]on 12/11/21). She has no history of SBP with recent studies on 05/11/22 (PMN 9) and 04/23/22 (PMN 1); other studies notable negative amylase, negative for malignancy on cytology on 05/12/22. Her labs are notable for persistently hypoalbuminemia <2 to 1.2-1.9 since 11/2021 and thrombocytopenia since 12/2021, along with elevated INR to 1.4-1.5 since 12/2021 and with reassuring Tb 0.5-1.1 since 04/2022 (from 8-16 from 12/2021-04/2022).    Her imaging with CTAP on 12/14/21 showed marked steatosis along with US doppler on 12/11/21 showing steatosis with portal vein diameter of 15 mm. US abdomen on 12/30/21 showed steatosis with normal surface contour. [place] with doppler on 05/16/22 showed mild liver surface nodularity compatible with early cirrhosis with sequela of portal hypertension including moderate ascites and splenomegaly.     She is currently on furosemide 40 mg and spironolactone 100 mg daily (from 20 mg and 50 mg daily, respectively, on 05/03/22). Unfortunately, continues to require weekly LVP (last 05/04/22 with 4L and 05/11/22 with 5L). Her TTE on 12/23/21 showed EF 70-75% with no LVH, no WMA, normal RA/RV, no significantly valvular disease. Urine studies with mild proteinuria.     Past Medical History:  Past Medical History:   Diagnosis Date    Acid reflux disease     Anxiety     Depression     Hereditary angioedema (CMS code)     Multiple myeloma, without mention of having achieved remission     IgG kappa    Myeloma (CMS code) 05/10/2011       Past Surgical History:   Procedure Laterality Date    AUTOLOGOUS STEM CELL TRANSPLANTATION  05/12/11    BREAST CYST EXCISION  1981       Medications:  Current Facility-Administered Medications   Medication Dose Route Frequency Provider Last Rate Last Admin    0.9% sodium chloride  flush injection syringe 3 mL  3 mL Intravenous Q12H [facility] [name], DO   3 mL at 05/17/22 0957    0.9% sodium chloride flush injection syringe 3 mL  3 mL Intravenous PRN [name], DO        acetaminophen (TYLENOL) tablet 650 mg  650 mg Per G Tube Q8H PRN [name], NP   650 mg at 05/11/22 0922    albuterol (PROVENTIL) 2.5 mg /3 mL (0.083 %) inhalation solution 2.5 mg  2.5 mg Nebulization Q4H PRN [name], NP        Or    albuterol (PROVENTIL) inhalation solution 2.5 mg  2.5 mg Nebulization Q4H PRN [name], NP        aluminum-magnesium hydroxide-simethicone (MAALOX PLUS) 200-200-20 mg/5 mL suspension 15 mL  15 mL Per G Tube Q4H PRN [name], NP   15 mL at 05/16/22 2244    cholecalciferol (vitamin D3) tablet 2,000 Units  2,000 Units Per G Tube Daily [facility] [name], MD   2,000 Units at 05/17/22 0957    cyanocobalamin (Vitamin B12) injection 1,000 mcg  1,000 mcg Subcutaneous Q7 Days [name], NP   1,000 mcg at 75/64/33 2951    folic acid (FOLVITE) tablet 1 mg  1 mg Per G Tube Daily [facility] [name], NP   1 mg at 05/17/22 0957    [START ON 05/18/2022] furosemide (LASIX) tablet 60 mg  60 mg Per G Tube Daily [place], NP        HYDROcodone-acetaminophen (NORCO) 5-325 mg tablet 1-2 tablet  1-2 tablet Per G Tube Q6H PRN [name], NP   2 tablet at 05/17/22 1354    HYDROmorphone (DILAUDID) tablet 1 mg  1 mg Oral Q4H PRN [name], NP   1 mg at 05/16/22 2313    hydrOXYzine (ATARAX) tablet 25 mg  25 mg Feeding Tube Q4H PRN [name], NP   25 mg at 05/17/22 0957    lansoprazole (PREVACID) suspension 30 mg  30 mg Per G Tube Q AM Before Breakfast [facility] [name], NP   30 mg at 05/17/22 0956    letermovir (PREVYMIS) tablet 480 mg  480 mg Other Daily [facility] [name], NP   480 mg at 05/17/22 0957    loperamide (IMODIUM A-D) tablet 4 mg  4 mg Feeding Tube Q8H [name], NP   4 mg at  05/17/22 1808    mirtazapine (REMERON) tablet 30 mg  30 mg Per G Tube Daily At Bedtime [facility] [name], MD   30 mg at 05/16/22 2204    multivitamin with iron-minerals-folic acid (THERA M PLUS) tablet 1 tablet  1 tablet Feeding Tube Daily [place], NP   1 tablet at 05/17/22 0957    ondansetron (ZOFRAN) tablet 8 mg  8 mg Per G Tube TID [name], NP   8 mg at 05/17/22 1808    opium tincture 6 mg  6 mg Oral 4x Daily PRN [name], NP   6 mg at 04/27/22 2219    prochlorperazine (COMPAZINE) injection 5 mg  5 mg Intravenous Q8H PRN [name], NP   5 mg at 05/17/22  1093    Or    prochlorperazine (COMPAZINE) tablet 5 mg  5 mg Per G Tube Q8H PRN [name], NP   5 mg at 05/12/22 0730    rifAXIMin (XIFAXAN) tablet 550 mg  550 mg Feeding Tube BID [facility] [name], MD   550 mg at 05/17/22 0957    simethicone (MYLICON) drops 80 mg  80 mg Oral 4x Daily [facility] [name], NP   80 mg at 05/17/22 1808    [START ON 05/18/2022] spironolactone (ALDACTONE) tablet 150 mg  150 mg Per G Tube Daily [place], NP        thiamine mononitrate (vit B1) tablet 100 mg  100 mg Per G Tube Daily [facility] [name], MD   100 mg at 05/17/22 0956    ursodiol (ACTIGALL) syrup 600 mg  600 mg Per G Tube BID [facility] [name], MD   600 mg at 05/17/22 1002       Allergies:   She is allergic to diphenoxylate-atropine and caffeine.    Social History:  Social History     Socioeconomic History    Marital status: Married     Spouse name: Not on file    Number of children: Not on file    Years of education: Not on file    Highest education level: Not on file   Occupational History    Not on file   Tobacco Use    Smoking status: Unknown    Smokeless tobacco: Not on file   Substance and Sexual Activity    Alcohol use: Not on file    Drug use: Not on file    Sexual activity: Not Currently   Other Topics Concern    Not on file   Social History Narrative    Not on file      Social Determinants of Health     Financial Resource Strain: Not on file   Food Insecurity: Not on file   Transportation Needs: Not on file       Family History:   Her family history includes Colon cancer in her father; Diabetes in her mother; Hypertension in her brother and mother.     Review of Systems:  General: No fevers, chills, night sweats.   HEENT: No sore throat, no hoarseness  Pulm: No shortness of breath  Cards: No chest pain, palpitations  GI: No nausea/vomiting/diarrhea/constipation  GU: No changes in urination  MSK: No joint pain, swollen joints  Derm: No rashes  Neuro: No LOC, no focal weaknesses  Endo: No polyuria, polydipsia    Physical Exam:  Temp:  [36.8 C (98.2 F)-37.2 C (99 F)] 37.2 C (99 F)  Heart Rate:  [77-81] 77  *Resp:  [16-18] 16  BP: (90-97)/(66-76) 97/66  Vitals:    05/17/22 0953 05/17/22 1353 05/17/22 1454 05/17/22 1546   BP: 97/69   97/66   BP Location: Right upper arm   Right upper arm   Patient Position: Lying   Lying   Pulse: 80   77   Resp: 18 16 16 16    Temp: 36.9 C (98.4 F)   37.2 C (99 F)   TempSrc: Oral   Oral   SpO2: 100%   100%   Weight:          GENERAL: appears ill, comfortable, NAD, normocephalic, atraumatic  HEENT: atraumatic, normocephalic, moist mucous membranes  LUNG: no w/r/c  ABDOMEN: soft, PEG in place, ND, NT  no g/r/r  EXT: BLE edema, WWP  SKIN: no jaundice, spider angiomata, palmar erythema  NEURO: AAOx3, no focal deficits  PSYCH: nml mood/affect    Intake/Output    Intake/Output Summary (Last 24 hours) at 05/17/2022 1851  Last data filed at 05/17/2022 1547  Gross per 24 hour   Intake 695 ml   Output 825 ml   Net -130 ml     I/O last 3 completed shifts:  In: 2123 [P.O.:710; I.V.:3; NG/GT:210; Feeding Tube:1200]  Out: 900 [Urine:900]  07/02 1901 - 07/03 1900  In: 695 [P.O.:290]  Out: 825 [Urine:825]    Labs: I have personally reviewed the following laboratory studies:    Recent Labs     05/17/22  0559 05/16/22  0539 05/15/22  0544   NA 136 137 137   K 4.4  4.2 4.3   CL 101 101 100*   CO2 29 30* 30*   BUN 27* 28* 27*   CREAT 0.62 0.63 0.69   GLU 115 111 89   CA 7.9* 7.9* 8.1*   MG 2.1 2.1 2.1   PO4 3.0 2.9 3.3     Recent Labs     05/17/22  0559 05/16/22  0539 05/15/22  0544   ALT 18 15 17    AST 50* 38 40   ALKP 181* 169* 186*   TBILI 0.6 0.5 0.6     Recent Labs     05/17/22  0559 05/16/22  0539 05/15/22  0544   WBC 5.1 4.8 6.1   HGB 8.4* 7.9* 8.4*   HCT 27.7* 25.5* 28.1*   MCV 105* 107* 109*   PLT 160 147 153     Recent Labs     05/17/22  0559   INR 1.2   PT 15.1*       No results found for: AMY  Lab Results   Component Value Date    Lipase 13 04/23/2022     Lab Results   Component Value Date    C-Reactive Protein 11.3 (H) 12/13/2021      Lab Results   Component Value Date    Sedimentation Rate 5 12/13/2021        Microbiology:  Microbiology Results (last 72 hours)       Procedure Component Value Units Date/Time    Bacterial Culture, Normally Sterile Sites, With Gram Stain [[id number]] Collected: 05/11/22 1115    Order Status: Completed Specimen: Not Applicable from Peritoneal Fluid Updated: 05/17/22 1217     Gram Stain Rare Host cells (not PMNs, RBCs, or squamous epithelial cells) , No PMNs seen      No organisms seen     Bacterial Culture, Sterile Sites No growth 6 days.          Radiology:   Radiology Results (last 24 hours)       ** No results found for the last 24 hours. **          Studies:  No results found for this or any previous visit.  No results found for this or any previous visit.    Endoscopy:  No results found for this or any previous visit.  No results found for this or any previous visit.     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 Unfortunately, she has developed complications as below.   -- Monitor MELDNa at every other is okay    # Ascites, large, high SAAG >1.1 and low protein <1.5, without history of spontaneous bacterial peritonitis, chronic/recurrent  History of ascites with high SAAG >1.1 and low protein <1.5 suggestive of liver-related portal hypertension, without history of SBP on blood cultures. Other etiologies to consider malignancy/infection, nephrotic syndrome, and protein caloric enteropathy, though less likely given high SAAG ascites. No other evidence of cardiac etiology and/or nephrotic syndrome. Given weekly LVP, would increase diuretic regimen as tolerated per renal function. If persistent despite max diuretics, then would also consider TIPS given low MELDNa and mild hepatic encephalopathy.  -- Increase furosemide to 60 mg daily from 40 mg daily (max dose is 160 mg/day)  -- Increase spironolactone to 150 mg daily from 100 mg daily (max dose is 400 mg/day)  -- If tolerated, then increase using 2:5 ratio of furosemide and spironolactone every 3-4 days while monitoring serum sodium and Cr  -- If persistent despite max diuretics as tolerated, then could consider TIPS; however, her HVPG was only 8 mmHg in 11/2021, thus may not benefit with further decreased in HVPG and would rather prioritize in optimizing nutrition for improve hypoalbuminemia  -- Continue to optimize nutrition/tube feeds to improve albumin as hypoalbuminemia is contributory to recurrent large ascites due to low oncotic pressure and decreased effectiveness of diuretics  -- Continue low sodium diet <2 g/day    # Hepatic encephalopathy, mild  -- Continue rifaximin 550 mg bid amd lactulose 10 g tid, though latter limited due to chronic diarrhea    #  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 Liane Comber.    Thank you for this consult, we will continue to follow with you.  Please don't hesitate to page [phone] with any questions or changes in the patient's clinical status.      Ariel Wannamaker B. Lamonte Sakai, MD, MAS  Advanced/Transplant Hepatology Fellow

## 2022-05-17 NOTE — Consults (Signed)
PHYSICAL THERAPY TREATMENT NOTE    PT relevant HPI  65F MM, chronic diarrhea (CMV colitis vs norovirus infection), acute liver injury, admitted for persistent diarrhea and FTT.    ASSESSMENT  Patient was motivated to participate in therapy. Continued BLE WB exercises and standing activity tolerance in Combilizer. Patient tolerated 25 minutes progressive standing with BLE strengthening exercises and BUE AROM exercises for aerobic endurance. She remains motivated to progress BLE strengthening and standing. Patient will benefit from ongoing PT intervention to progress towards goals of maximizing patient's overall functional mobility.    Focus next session: activity tolerance, EOB tolerance, combi    RECOMMENDATIONS  DISCHARGE RECOMMENDATION  Comments Placement with ongoing PT needs      Patient Current Functional Status Sufficient for PT Discharge Recommendation Yes   Discharge DME needs TBD   Discharge transportation needs [facility] Potential Patient participates well in therapy and progressing towards goals     NURSING RECOMMENDATIONS  Inpatient Rehab Assistive Device Recommendation Vertical dependent lift;Lateral transfer device   Activity Recommendation OOB to chair daily via sling lift     SUBJECTIVE  Subjective report:  Agreeable and cooperative to therapy. "What's next?"  Notable observations:  Returned lying in bed with all needs in reach. Family at bedside    SYSTEMS REVIEW  Cognition/Communication  Delirium screen:  Yes Delirium Screen:   Details:  at risk given prolonged hospitalizations and decreased mob    Communication  Cognition/communication impaired:  Yes        Behavior: Anxious    Integumentary  Integumentary deficits:  YesIntegumentary deficit detail:  PEG    Cardiopulmonary  Cardiopulmonary deficits:  Yes  Detail:  decreased activity tolerance    Musculoskeletal  Musculoskeletal deficits:  Yes  Abnormal strength findings: B LE grossly 3-/5  Abnormal range of motion findings: gastroc tightness  ankles unable to achieve neutral, shoulder flexion AAROM ~100 degrees      Pain     Currently in pain: Yes  Pain location: sacral and periarea pain  Alleviators/aggravators/intervention: improves when offloaded in Combilizer     Pain Level Upon Arrival: Hurts a little bit  Highest Pain Level During Therapy: Hurts little more  Pain Level End of Therapy: Hurts a little bit    COMPREHENSIVE MOVEMENT ANALYSIS/TREATMENT  Precautions/WB status: Yes  Precautions and weight bearing status comments: Falls, delirium, skin, aspiration, contact      Hemodynamic response:   Normal hemodynamic response: Yes  Response:     Comments: VSS on RA      Functional Mobility  Requires second person/additional health care providers: Yes  Type additional health care provider: Rehab aide    Bed mobility Current Initial    Rolling  Level of assist  Minimal assist  Minimal assist (05/11/22 1331)   Device  Bed rail;Log roll  Bed rail (05/11/22 1331)   Intervention  Verbal cues;Tactile cues;External cues  Verbal cues (05/11/22 1331)   Rolling comments:     Transfer Current Initial   Sit < > Stand  Level of assist     Device     Intervention     Transfer  Level of assist  From: Bed  To: Combilizer  Dependent assist;Two person assist  Dependent assist;Two person assist (04/29/22 1209)   Device   Vertical dependent lift (04/29/22 1209)   Intervention     Sit < > Stand comments:   Transfer comments:   hovermat to combi, 25 min progressive standing in combi, progressive stands at  40, 60, and 65 degrees, towels positioned at heels 2/2 decreased DF ROM    Balance  Balance deficits noted:    Exercise/other interventions  Standing exercises comment (other exercise, details): mini squats, UE bicep curls and punches in combi    Communication between other health care providers: Communication between other health care provider: RN  Communication comment: pt's functional status     Education Careers adviser: Patient  Content: Plan of care, Activity  recommendations, Discharge recommendations  Response: Needs reinforcement, Verbalizes understanding, Demonstrates understanding  Outcome measures   Physical Therapist Global Assessment of Mobility  Activity Achieved: Tilt Table positioning/neuro chair  AMPAC 6-clicks basic mobility score: 10      PLAN  Plan of care status:  Current plan of care remains appropriate  PT frequency:  5x/week  PT duration:  4 weeks.      Planned PT interventions:   Specific interventions: Progressive functional mobility training;Balance training;NM Re-ed;Aerobic training;Ther ex  Education interventions: Fall risk reduction;Exercise program;Self-pacing/breathing;Benefits of activity;Caregiver training      Lorella Nimrod, PT    05/17/2022

## 2022-05-17 NOTE — Progress Notes (Addendum)
[facility] PROGRESS NOTE     This is an independent service.   The available consultant for this service is Marion Downer, MD.           24 Hour Course  - HDS, afebrile  - BMx1 today, no change in loperamide  - RUQ u/s w/ doppler c/w early cirrhosis and portal HTN, increased aldactone/lasix as per hepatology recs  - Standing in steady x60mn w/ PT today    Subjective  Sitting up in bed - Was feeling quite anxious at the beginning of our conversation and shared some childhood traumas that surface when she is alone or at night. Felt better at the end of our conversation. She noted ongoing pain d/t pressure injury and relief w/ premedication before PT/OT and application of cream. Still has intermittent abdominal pain and cramping but back to one soft BM/day.Not feeling distended. Her appetite is improved and yesterday she ate several solid foods and tolerated this well.     Vitals  Temp:  [36.8 C (98.2 F)-37.4 C (99.3 F)] 36.9 C (98.4 F)  Heart Rate:  [77-82] 80  *Resp:  [16-18] 18  BP: (90-97)/(62-76) 97/69  SpO2:  [96 %-100 %] 100 %    MostRecent Weight: 67.4 kg (148 lb 9.4 oz)  Admission Weight: 66.2 kg (146 lb)      Intake/Output Summary (Last 24 hours) at 05/17/2022 1013  Last data filed at 05/17/2022 0500  Gross per 24 hour   Intake 430 ml   Output 900 ml   Net -470 ml       Physical Exam  Gen: NAD, appropriate and engaged  HEENT: MMM, no oral lesions, sclera non-icteric  Resp: CTAB, no rhonchi/wheezing  Card: RRR  Gi: distended, soft, non-tender, +bs x4  Ext: negative BLE edema   Skin: scattered ecchymosis to BUE  Neuro: Alert, oriented x3, no focal neuro deficits  Lines: PIV x1 in place, clean dry & intact dressing    Scheduled Meds:   0.9% sodium chloride flush  3 mL Intravenous Q12H SS[place]   cholecalciferol (vitamin D3)  2,000 Units Per G Tube Daily [place]   cyanocobalamin (Vitamin B12)  1,000 mcg Subcutaneous Q7 Days    folic acid  1 mg Per G Tube Daily SS[place]   furosemide  40 mg Per G Tube Daily  [place]   lansoprazole  30 mg Per G Tube Q AM Before Breakfast [facility]   letermovir  480 mg Other Daily SF[place]   loperamide  4 mg Feeding Tube Q8H    mirtazapine  30 mg Per G Tube Daily [facility]time [facility]   multivitamin with iron-minerals-folic acid  1 tablet Feeding Tube Daily SCH    ondansetron  8 mg Per G Tube TID    rifAXIMin  550 mg Feeding Tube BID SCH    simethicone  80 mg Oral 4x Daily [place]   spironolactone  100 mg Per G Tube Daily [place]   thiamine mononitrate (vit B1)  100 mg Per G Tube Daily [place]   ursodiol  600 mg Per G Tube BID SSan Manuel    Continuous Infusions:  PRN Meds:   0.9% sodium chloride flush  3 mL Intravenous PRN    acetaminophen  650 mg Per G Tube Q8H PRN    albuterol  2.5 mg Nebulization Q4H PRN    Or    albuterol  2.5 mg Nebulization Q4H PRN    aluminum-magnesium  hydroxide-simethicone  15 mL Per G Tube Q4H PRN    HYDROcodone-acetaminophen  1-2 tablet Per G Tube Q6H PRN    HYDROmorphone  1 mg Oral Q4H PRN    hydrOXYzine  25 mg Feeding Tube Q4H PRN    opium  6 mg Oral 4x Daily PRN    prochlorperazine  5 mg Intravenous Q8H PRN    Or    prochlorperazine  5 mg Per G Tube Q8H PRN       Data    CBC        05/17/22  0559 05/16/22  0539   WBC 5.1 4.8   HGB 8.4* 7.9*   HCT 27.7* 25.5*   PLT 160 147       Coags        05/17/22  0559   INR 1.2         Chem7        05/17/22  0559 05/16/22  0539   NA 136 137   K 4.4 4.2   CL 101 101   CO2 29 30*   BUN 27* 28*   CREAT 0.62 0.63   GLU 115 111       Electrolytes        05/17/22  0559 05/16/22  0539   CA 7.9* 7.9*   MG 2.1 2.1   PO4 3.0 2.9       Liver Panel        05/17/22  0559 05/16/22  0539   AST 50* 38   ALT 18 15   ALKP 181* 169*   TBILI 0.6 0.5   ALB 2.0*  --        UA  No results found in last 7 days    Microscopy:  No results found in last 7 days    Microbiology Results (last 72 hours)       Procedure Component Value Units Date/Time    Bacterial Culture, Normally Sterile Sites, With Gram Stain [[id number]] Collected: 05/11/22 1115    Order Status: Completed Specimen:  Not Applicable from Peritoneal Fluid Updated: 05/16/22 1439     Gram Stain Rare Host cells (not PMNs, RBCs, or squamous epithelial cells) , No PMNs seen      No organisms seen     Bacterial Culture, Sterile Sites No growth 5 days.          Radiology Results   US Abdomen Limited with Doppler (Radiology Performed)    Result Date: 05/16/2022  1.  Mild liver surface nodularity compatible with early cirrhosis and/or fibrosis. Patent vasculature with normal direction of flow. 2.  Sequela of portal hypertension including increased now moderate ascites and splenomegaly. 3.  Cholelithiasis. Report dictated by: Arnaldo Natal, MD, signed by: Arnaldo Natal, MD Department of Radiology and Biomedical Imaging     I discussed the patient with Marion Downer, MD from [facility] regarding A&P.    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  2011: VRd bortezomib, Lenalidomide, Dexamethasone  04/2011: Auto SCT  07/2014: Rd - revlimid 15 mg every other day, dexamethasone 8 mg PO weekly.   04/2019 due to progression of M spike Revlimid was increased to 15 mg PO D1-21 every 28 days, plus dexamethasone  05/2020: Dara-Pd -Daratumumab + Pomylast  2 mg D 1-21, Q28 days, dexamethasone 12 mg PO Q weekly.   Receiving dexamethasone 12 mg weekly for diarrhea as outpt  10/16/21 - last Daratumumab dose (on hold due to diarrhea)     DATA:   09/07/2010: IgG Kappa MM  12/02/2021: Ig M <5, IgG 447, IgA 20, Ig E <2, SFLC Kappa 16.6, Lambda 1.6, Kappa/Lamda 10.38  12/14/21: IgG K on IFE, IgG 430, KLC 16.6, LLC 2.7, KLR 6.15  02/23/22 SPEP with stable M-spike, SFLC decreased Kappa 44.2 / Lambda 5.5 = K/L ratio = 8.04 (overall downtrending still)  04/23/22 M-spike 0.6, IgG K on IFE, IgG 997, Kappa 70.7/Lambda 19.8 = K/L ratio = 3.57  Monitor MM labs  monthly per Dr Jacelyn Grip, next due 7/9     Chemo:  TBD, therapy held since 10/2021 due to recurrent infections and now deconditioning  Not currently a candidate due to significant deconditioning, may be candidate in the future if improved.     Supportive Care:   Transfuse for Hgb <7 and Platelets <10  Access: PIV     Primary Oncologist:   Dr. Jacelyn Grip ([place]), will transfer to Dr. Hassell Done ([place])  Dr. Iona Coach ([place])     Disposition:  Lives in [place] - anticipate discharge to [place] [facility] athorization per [facility] 6/29  '[ ]'$  will need paracentesis q7-10 days coordinated outpatient with IR and transportation arranged with family       # Immunocompromised  # Diarrhea - controlled  # c/b CMV Viremia  # Hx prolonged norovirus infection  Immunocompromised due to underlying  malignancy, not currently neutropenic. Recent extended hospitalization at both [facility] 1/23-04/16/22, with hypovolemic shock associated with norovirus, CMV colitis, acute liver failure associated with pomylast (see ALF section).  Currently admitted for ongoing diarrhea and FTT in brief time at home after dc from [facility], initially began in 8/22, recently managed at home w/ loperamide BID w/ 2-3 watery stools daily. Diarrhea worsened on 6/12 (11 stools). Possibly r/t to inadequate dosing of loperamide vs infectious etiology. GI c/s 6/13 for possible scope - per patient and family declined endoscopy at this time. ID consultation deferred as infectious w/u negative.   Given CMV improving, norovirus negative, and enteric panel unrevealing - favor non-infectious etiologies for diarrhea. ID has requested we maintain enteric precautions for hospitalization d/t prolonged prior GI infections.   Diarrhea ~4 stools/day as of 7/1, improved to 1 soft/formed stool/d on 7/3.Marland Kitchen     Data  - Norovirus  - First +norovirus 08/2021 with persistent +norovirus s/p 7-day courses x 2 nitazoxanide (2/16-02/22/23 & 01/2022). Continued to be norovirus  positive (most recently on 04/07/22). Norovirus, cdiff, GI PCR neg this admission (6/9).   - CMV  -Regarding CMV colitis, 12/17/21 flex sig showed CMV colitis, sp multiple cycles of gancyclovir IV, IVIG, valcyte 900 mg PO BID, then ultimately transitioned to letermovir, which she continues. 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   # Hepatic Encephalopathy  Steady rise in LFTs began 05/2021. Acute rise in 11/2021,coinciding with acute CMV viremia but liver biopsy CMV stains negative (12/11/21). 12/14/21: Hepatitis/viral work-up negative in 11/2021. LP was done on 12/16/21 for hepatic encephalopathy w/ CMV CSF PCR negative. Suspected to be 2/2 pomalidamide, though liver path from 12/22/21 unlikely for vanishing bile duct syndrome.   Upon admission was more somnolent and one episode of possible visual hallucination reported on 6/23. Her mentation has improved throughout this hospitalization  As of 7/3 AxOx3, anticipate repeat para prior to discharging to SNF. C/s to Hepatology (7/2) regarding optimizing medical management and improving ascites: ascites felt to be 2/2 portal HTN and malnutrition (see below). Goal will be to reduce paracentesis need to 1x/month by optimizing diuresis and improving nutritional status. Appreciate recs.    Dx:  - 6/17 KUB: mild ileus and density over abdomen (likely ascitic fluid) - not clinically significant as patient is having regular  bowel movements and no PVR noted.  - 6/20 therapeutic paracentesis by TAPS, -4L + albumin  - 6/23 ammonia normal  - 6/27 paracentesis by TAPS -5L + albumin, SBP unrevealing or infection. cytology negative for malignancy  - 7/2 RUQ u/s w/ doppler: Mild liver surface nodularity c/w early cirrhosis and/or fibrosis. Patent vasculature with normal direction of flow. Sequela of portal hypertension including increased now moderate ascites and splenomegaly. Cholelithiasis.    Plan:  -Appreciate hepatology recs:  Increase spironolactone & lasix as below, with goal of optimizing so that paracentesis requirement is improved to 1x/month. Hepatology will continue to follow.   -Increase spironolactone '150mg'$  + lasix '60mg'$  to minimize accumulation of ascites   (started 6/16, increased 6/19-, last incr 7/3- )  Hold for SBP<90 or MAP<60  Currently requiring paracentesis q7-10d - will need to be arranged for d/c   -Continue Ursodiol 600 mg PO BID  -Continue rifaximin 550 mg PO BID  -Low sodium diet  -2L fluid restriction  -Delirium precautions     # Failure to thrive  # PEG tube dependence  # Macrocytic anemia - B12 deficiency  PEG tube placed during last admission. Low B12 level but MMA normal, which is not consistent with B12 deficiency, however still treating at this time as below. Encouraged PO intake in addition to tube feeds. Transitioned to cyclic tube feeds on 9/38.  6/29 - tolerating cyclic TF and improving oral intake  7/3 - tolerating more oral intake, including solids    Plan:  -Continue cyclic TF - Nutren 1.5 @ 75 mL x 16 hours (1800-1000) - TID residuals per RN protocol  -Continue Thera-M Plus MVI for copper deficiency (Copper = 47 on 6/16)  -Continue mirtazapine  '30mg'$  nightly (incr 6/18- ) per palliative recs  -Continue cyanocobalamin 1022mg SQ weekly (6/24- )   -s/p Cyanocobalamin 10070m SQ daily x7 days (6/17-6/23)  -s/p Zinc acetate 6/11-6/23)    # Dysphagia  Dysphagia and aspiration risk assessed by speech language  pathology. SLP recommended Modified Barium Swallow (completed on 6/21) which showed moderate pharyngeal aspiration; SLP recommends regular diet w/ mildly thickened liquids.   6/22 - Pt and daughter request to thicken liquids themselves at bedside for more variety, understand risk of aspiration w/ thins. Liberalized diet per their request to regular.   Appreciate SLP recs.   - See failure to thrive section as well re: PEG tube and nutritional deficiencies     #  Pain  # Pressure Injury (sacrum)  Pain primarily r/t pressure injuries though occasionally also d/t abdominal cramping. Wound healing likely limited d/t poor nutritional status (see above) and limited mobility (improving).  -Consulted Palliative Care 6/17 for SMS, now s/o  -Continue hydrocodone-acetaminophen PRN  -Continue hydromorphone per G tube PRN     # Physical Deconditioning  Pt motivated to participate w/ PT/OT, deconditioned after prior extended hospitalization (as above). Showing marked improvements in endurance w/ static siting balance & bed mobility.   - Continue PT/OT   - recommend SNF placement    #GOC  Pt is consistent and clear that she wishes to pursue full medical management of her ongoing medical problems, as above, with the goal of prolonging her life. She is motivated by her desire to spend time with her family. This is consistent in conversations with [facility] (6/18) & Dr. Jacelyn Grip (6/21).     VTE PPx:  Contraindicated: Anticipated procedure    Severity of Illness  Stable    Nutritional Assessment  Severe Chronic disease or condition related malnutrition    During this hospitalization the patient is also being treated for:  - Anemia associated with malignancy (present on admission)    - Thrombocytopenia (present on admission)    - Coagulopathy (present on admission)    - Malignancy associated fatigue on admission  - Neoplasm/malignancy associated pain  - Hypoalbuminemia (present on admission)  - HYPOnatremia (present on admission)  - Fluid  status: Volume overload on admission  - Cachexia  - Acute liver failure, present on admission    Code Status: FULL        I spent a total time of 70 minutes in this episode of care preparing to see the patient, obtaining and/or reviewing separately obtained history, performing a medically appropriate examination and/or evaluation, counseling and educating patient/family/caregiver, ordering and reviewing medications, tests, or procedures, referring and communicating with other health care professionals, documenting clinical information in the electronic or other health record, and communicating results to the patient/family/caregiver, and/or other care coordination.    Evangeline Gula, NP  Hematology-Oncology BMT/CT  Pager: [phone]  05/17/22  4:07 PM

## 2022-05-18 LAB — MAGNESIUM, SERUM / PLASMA: Magnesium, Serum / Plasma: 2 mg/dL (ref 1.6–2.6)

## 2022-05-18 LAB — BASIC METABOLIC PANEL (NA, K, CL, CO2, BUN, CR, GLU, CA)
Anion Gap: 7 (ref 4–14)
Calcium, total, Serum / Plasma: 7.8 mg/dL — ABNORMAL LOW (ref 8.4–10.5)
Carbon Dioxide, Total: 28 mmol/L (ref 22–29)
Chloride, Serum / Plasma: 100 mmol/L — ABNORMAL LOW (ref 101–110)
Creatinine: 0.65 mg/dL (ref 0.55–1.02)
Glucose, non-fasting: 114 mg/dL (ref 70–199)
Potassium, Serum / Plasma: 4.1 mmol/L (ref 3.5–5.0)
Sodium, Serum / Plasma: 135 mmol/L (ref 135–145)
Urea Nitrogen, Serum / Plasma: 25 mg/dL (ref 7–25)
eGFRcr: 99 mL/min/{1.73_m2} (ref >59)

## 2022-05-18 LAB — COMPLETE BLOOD COUNT WITH DIFFERENTIAL
Abs Basophils: 0.01 10*9/L (ref 0.00–0.10)
Abs Eosinophils: 0.08 10*9/L (ref 0.00–0.40)
Abs Imm Granulocytes: 0.07 10*9/L (ref <0.10)
Abs Lymphocytes: 0.73 10*9/L — ABNORMAL LOW (ref 1.00–3.40)
Abs Monocytes: 0.56 10*9/L (ref 0.20–0.80)
Abs Neutrophils: 3.03 10*9/L (ref 1.80–6.80)
Hematocrit: 26.4 % — ABNORMAL LOW (ref 36.0–46.0)
Hemoglobin: 8.1 g/dL — ABNORMAL LOW (ref 12.0–15.5)
MCH: 32.3 pg (ref 26.0–34.0)
MCHC: 30.7 g/dL — ABNORMAL LOW (ref 31.0–36.0)
MCV: 105 fL — ABNORMAL HIGH (ref 80–100)
MPV: 12.5 fL (ref 9.1–12.6)
Platelet Count: 154 10*9/L (ref 140–450)
RBC Count: 2.51 10*12/L — ABNORMAL LOW (ref 4.00–5.20)
RDW-CV: 17.2 % — ABNORMAL HIGH (ref 11.7–14.4)
WBC Count: 4.5 10*9/L (ref 3.4–10.0)

## 2022-05-18 LAB — PHOSPHORUS, SERUM / PLASMA: Phosphorus, Serum / Plasma: 3.1 mg/dL (ref 2.3–4.7)

## 2022-05-18 LAB — BILIRUBIN, TOTAL: Bilirubin, Total: 0.5 mg/dL (ref 0.2–1.2)

## 2022-05-18 LAB — ALKALINE PHOSPHATASE: Alkaline Phosphatase: 169 U/L — ABNORMAL HIGH (ref 38–108)

## 2022-05-18 LAB — ALANINE TRANSAMINASE: Alanine transaminase: 16 U/L (ref 10–61)

## 2022-05-18 LAB — ASPARTATE TRANSAMINASE: AST: 47 U/L — ABNORMAL HIGH (ref 5–44)

## 2022-05-18 MED ORDER — PROCHLORPERAZINE EDISYLATE 10 MG/2 ML (5 MG/ML) INJECTION SOLUTION
10 | Freq: Once | INTRAMUSCULAR | Status: AC
Start: 2022-05-18 — End: 2022-05-18
  Administered 2022-05-19: 06:00:00 via INTRAVENOUS

## 2022-05-18 MED FILL — XIFAXAN 550 MG TABLET: 550 mg | ORAL | Qty: 1

## 2022-05-18 MED FILL — ONDANSETRON HCL 4 MG TABLET: 4 mg | ORAL | Qty: 2

## 2022-05-18 MED FILL — SPIRONOLACTONE 25 MG TABLET: 25 mg | ORAL | Qty: 2

## 2022-05-18 MED FILL — HYDROXYZINE HCL 25 MG TABLET: 25 mg | ORAL | Qty: 1

## 2022-05-18 MED FILL — MIRTAZAPINE 30 MG TABLET: 30 mg | ORAL | Qty: 1

## 2022-05-18 MED FILL — FUROSEMIDE 20 MG TABLET: 20 mg | ORAL | Qty: 3

## 2022-05-18 MED FILL — MAG-AL PLUS 200 MG-200 MG-20 MG/5 ML ORAL SUSPENSION: 200-200-20 mg/5 mL | ORAL | Qty: 30

## 2022-05-18 MED FILL — LANSOPRAZOLE 3 MG/ML ORAL SUSPENSION COMPOUNDING KIT: 3 mg/mL | ORAL | Qty: 10

## 2022-05-18 MED FILL — CHOLECALCIFEROL (VITAMIN D3) 25 MCG (1,000 UNIT) TABLET: 1000 UNITS | ORAL | Qty: 2

## 2022-05-18 MED FILL — THERA M PLUS (FERROUS FUMARATE) 9 MG IRON-400 MCG TABLET: 9 mg iron-400 mcg | ORAL | Qty: 1

## 2022-05-18 MED FILL — URSODIOL 60 MG/ML ORAL SYRUP: 60 mg/mL | ORAL | Qty: 10

## 2022-05-18 MED FILL — HYDROCODONE 5 MG-ACETAMINOPHEN 325 MG TABLET: 5-325 mg | ORAL | Qty: 2

## 2022-05-18 MED FILL — SIMETHICONE 40 MG/0.6 ML ORAL DROPS,SUSPENSION: 40 mg/0.6 mL | ORAL | Qty: 1.2

## 2022-05-18 MED FILL — ANTI-DIARRHEAL (LOPERAMIDE) 2 MG TABLET: 2 mg | ORAL | Qty: 2

## 2022-05-18 MED FILL — URSODIOL (BULK) 100 % POWDER: 100 % | Qty: 0.6

## 2022-05-18 MED FILL — FOLIC ACID 1 MG TABLET: 1 mg | ORAL | Qty: 1

## 2022-05-18 MED FILL — THIAMINE MONONITRATE (VITAMIN B1) 100 MG TABLET: 100 mg | ORAL | Qty: 1

## 2022-05-18 MED FILL — PREVYMIS 480 MG TABLET: 480 mg | ORAL | Qty: 1

## 2022-05-18 NOTE — Progress Notes (Signed)
[facility] PROGRESS NOTE     This is an independent service.   The available consultant for this service is Marion Downer, MD.           24 Hour Course  - HDS, afebrile  - Bmx2 today, no change in loperamide  - Awaiting disposition plan    Subjective  Overall feels her abdominal pain is much improved.  Denies cramping today.  No nausea either.  Looking forward to her visit with her Daughter.  I opened the window blinds to allow for natural sunlight.  She had no questions moving forward    Vitals  Temp:  [36.9 C (98.4 F)-37.2 C (99 F)] 37.1 C (98.8 F)  Heart Rate:  [76-82] 82  *Resp:  [16-18] 18  BP: (87-97)/(59-75) 94/70  SpO2:  [96 %-100 %] 97 %    MostRecent Weight: 67.4 kg (148 lb 9.4 oz)  Admission Weight: 66.2 kg (146 lb)      Intake/Output Summary (Last 24 hours) at 05/18/2022 1140  Last data filed at 05/18/2022 1025  Gross per 24 hour   Intake 930 ml   Output 430 ml   Net 500 ml       Physical Exam  Gen: NAD, appropriate and engaged  HEENT: MMM, no oral lesions, sclera non-icteric  Resp: CTAB, no rhonchi/wheezing  Card: RRR  Gi: distended, soft, non-tender, +bs x4  Ext: negative BLE edema   Skin: scattered ecchymosis to BUE  Neuro: Alert, oriented x3, no focal neuro deficits  Lines: PIV x1 in place, clean dry & intact dressing    Scheduled Meds:   0.9% sodium chloride flush  3 mL Intravenous Q12H [place]    cholecalciferol (vitamin D3)  2,000 Units Per G Tube Daily [place]    cyanocobalamin (Vitamin B12)  1,000 mcg Subcutaneous Q7 Days    folic acid  1 mg Per G Tube Daily [place]    furosemide  60 mg Per G Tube Daily [place]    lansoprazole  30 mg Per G Tube Q AM Before Breakfast [facility]    letermovir  480 mg Other Daily [place]    loperamide  4 mg Feeding Tube Q8H    mirtazapine  30 mg Per G Tube Daily At Bedtime [facility]    multivitamin with iron-minerals-folic acid  1 tablet Feeding Tube Daily SCH    ondansetron  8 mg Per G Tube TID    rifAXIMin  550 mg Feeding Tube BID SCH    simethicone  80 mg Oral 4x Daily [place]     spironolactone  150 mg Per G Tube Daily [place]    thiamine mononitrate (vit B1)  100 mg Per G Tube Daily [place]    ursodiol  600 mg Per G Tube BID [place]     Continuous Infusions:  PRN Meds:   0.9% sodium chloride flush  3 mL Intravenous PRN    acetaminophen  650 mg Per G Tube Q8H PRN    albuterol  2.5 mg Nebulization Q4H PRN    Or    albuterol  2.5 mg Nebulization Q4H PRN    aluminum-magnesium hydroxide-simethicone  15 mL Per G Tube Q4H PRN    HYDROcodone-acetaminophen  1-2 tablet Per G Tube Q6H PRN    HYDROmorphone  1 mg Oral Q4H PRN    hydrOXYzine  25 mg Feeding Tube Q4H PRN    opium  6 mg Oral 4x Daily PRN    prochlorperazine  5 mg Intravenous Q8H PRN  Or    prochlorperazine  5 mg Per G Tube Q8H PRN     Data    CBC        05/18/22  0611 05/17/22  0559   WBC 4.5 5.1   HGB 8.1* 8.4*   HCT 26.4* 27.7*   PLT 154 160       Coags        05/17/22  0559   INR 1.2         Chem7        05/18/22  0611 05/17/22  0559   NA 135 136   K 4.1 4.4   CL 100* 101   CO2 28 29   BUN 25 27*   CREAT 0.65 0.62   GLU 114 115       Electrolytes        05/18/22  0611 05/17/22  0559   CA 7.8* 7.9*   MG 2.0 2.1   PO4 3.1 3.0       Liver Panel        05/18/22  0611 05/17/22  0559   AST 47* 50*   ALT 16 18   ALKP 169* 181*   TBILI 0.5 0.6   ALB  --  2.0*       UA  No results found in last 7 days    Microscopy:  No results found in last 7 days    Microbiology Results (last 72 hours)       Procedure Component Value Units Date/Time    Cytomegalovirus DNA, Quantitative PCR, Plasma [[id number]] Collected: 05/18/22 0611    Order Status: Sent Specimen: Blood from Serum Updated: 05/18/22 0706    Bacterial Culture, Normally Sterile Sites, With Gram Stain [[id number]] Collected: 05/11/22 1115    Order Status: Completed Specimen: Not Applicable from Peritoneal Fluid Updated: 05/17/22 1217     Gram Stain Rare Host cells (not PMNs, RBCs, or squamous epithelial cells) , No PMNs seen      No organisms seen     Bacterial Culture, Sterile Sites No growth 6 days.           Radiology Results  No results found.    I discussed the patient with Marion Downer, MD from [facility] regarding A&P.    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  2011: VRd bortezomib, Lenalidomide, Dexamethasone  04/2011: Auto SCT  07/2014: Rd - revlimid 15 mg every other day, dexamethasone 8 mg PO weekly.   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   Receiving dexamethasone 12 mg weekly for diarrhea as outpt  10/16/21 - last Daratumumab dose (on hold due to diarrhea)     DATA:   09/07/2010: IgG Kappa MM  12/02/2021: Ig M <5, IgG 447, IgA 20, Ig E <2, SFLC Kappa 16.6, Lambda 1.6, Kappa/Lamda 10.38  12/14/21: IgG K on IFE, IgG 430, KLC 16.6, LLC 2.7, KLR 6.15  02/23/22 SPEP with stable M-spike, SFLC decreased Kappa 44.2 / Lambda 5.5 = K/L ratio = 8.04 (overall downtrending still)  04/23/22 M-spike 0.6, IgG K on IFE,  IgG 997, Kappa 70.7/Lambda 19.8 = K/L ratio = 3.57  Monitor MM labs monthly per Dr Jacelyn Grip, next due 7/9     Chemo:  TBD, therapy held since 10/2021 due to recurrent infections and now deconditioning  Not currently a candidate due to significant deconditioning, may be candidate in the future if improved.     Supportive Care:   Transfuse for Hgb <7 and Platelets <10  Access: PIV     Primary Oncologist:   Dr. Jacelyn Grip ([place]), will transfer to Dr. Hassell Done ([place])  Dr. Iona Coach ([place])     Disposition:  Lives in [place] - anticipate discharge to [place] insurance athorization per [facility] 6/29  '[ ]'$  will need paracentesis q7-10 days coordinated outpatient with IR and transportation arranged with  family       # Immunocompromised  # Diarrhea - controlled  # c/b CMV Viremia  # Hx prolonged norovirus infection  Immunocompromised due to underlying  malignancy, not currently neutropenic. Recent extended hospitalization at both [place] 1/23-04/16/22, with hypovolemic shock associated with norovirus, CMV colitis, acute liver failure associated with pomylast (see ALF section).  Currently admitted for ongoing diarrhea and FTT in brief time at home after dc from [facility], initially began in 8/22, recently managed at home w/ loperamide BID w/ 2-3 watery stools daily. Diarrhea worsened on 6/12 (11 stools). Possibly r/t to inadequate dosing of loperamide vs infectious etiology. GI c/s 6/13 for possible scope - per patient and family declined endoscopy at this time. ID consultation deferred as infectious w/u negative.   Given CMV improving, norovirus negative, and enteric panel unrevealing - favor non-infectious etiologies for diarrhea. ID has requested we maintain enteric precautions for hospitalization d/t prolonged prior GI infections.   Diarrhea ~4 stools/day as of 7/1, improved to 1 soft/formed stool/d on 7/3.Marland Kitchen     Data  - Norovirus  - First +norovirus 08/2021 with persistent +norovirus s/p 7-day courses x 2 nitazoxanide (2/16-02/22/23 & 01/2022). Continued to be norovirus positive (most recently on 04/07/22). Norovirus, cdiff, GI PCR neg this admission (6/9).   - CMV  -Regarding CMV colitis, 12/17/21 flex sig showed CMV colitis, sp multiple cycles of gancyclovir IV, IVIG, valcyte 900 mg PO BID, then ultimately transitioned to letermovir, which she continues. 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   # Hepatic Encephalopathy  Steady rise in LFTs began 05/2021. Acute rise in 11/2021,coinciding with acute CMV viremia but liver biopsy CMV stains negative (12/11/21). 12/14/21: Hepatitis/viral work-up negative in 11/2021. LP was done on 12/16/21 for hepatic encephalopathy w/ CMV CSF PCR negative. Suspected to be 2/2 pomalidamide, though liver path from 12/22/21 unlikely for vanishing bile duct syndrome.   Upon admission was more somnolent and one episode of possible visual hallucination reported on 6/23. Her mentation has improved throughout this hospitalization  As of 7/3  AxOx3, anticipate repeat para prior to discharging to SNF. C/s to Hepatology (7/2) regarding optimizing medical management and improving ascites: ascites felt to be 2/2 portal HTN and malnutrition (see below). Goal will be to reduce paracentesis need to 1x/month by optimizing diuresis and improving nutritional status. Appreciate recs.    Dx:  - 6/17 KUB: mild ileus and density over abdomen (likely ascitic fluid) - not clinically significant as patient is having regular bowel movements and no PVR noted.  - 6/20 therapeutic paracentesis by TAPS, -4L + albumin  - 6/23 ammonia normal  - 6/27 paracentesis by TAPS -5L + albumin, SBP unrevealing or infection. cytology negative for malignancy  - 7/2 RUQ u/s w/ doppler: Mild liver surface nodularity c/w early cirrhosis and/or fibrosis. Patent vasculature with normal direction of flow. Sequela of portal hypertension including increased now moderate ascites and splenomegaly. Cholelithiasis.    Plan:  -Appreciate hepatology recs:  Increase spironolactone & lasix as below, with goal of optimizing so that paracentesis requirement is improved to 1x/month. Hepatology will continue  to follow.   -Increase spironolactone '150mg'$  + lasix '60mg'$  to minimize accumulation of ascites   (started 6/16, increased 6/19-, last incr 7/3- )  Hold for SBP<90 or MAP<60  Currently requiring paracentesis q7-10d - will need to be arranged for d/c   -Continue Ursodiol 600 mg PO BID  -Continue rifaximin 550 mg PO BID  -Low sodium diet  -2L fluid restriction  -Delirium precautions     # Failure to thrive  # PEG tube dependence  # Macrocytic anemia - B12 deficiency  PEG tube placed during last admission. Low B12 level but MMA normal, which is not consistent with B12 deficiency, however still treating at this time as below. Encouraged PO intake in addition to tube feeds. Transitioned to cyclic tube feeds on 8/65.  6/29 - tolerating cyclic TF and improving oral intake  7/3 - tolerating more oral intake, including solids    Plan:  -Continue cyclic TF - Nutren 1.5 @ 75 mL x 16 hours (1800-1000) - TID residuals per RN protocol  -Continue Thera-M Plus MVI for copper deficiency (Copper = 47 on 6/16)  -Continue mirtazapine  '30mg'$  nightly (incr 6/18- ) per palliative recs  -Continue cyanocobalamin 1067mg SQ weekly (6/24- )   -s/p Cyanocobalamin 10043m SQ daily x7 days (6/17-6/23)  -s/p Zinc acetate 6/11-6/23)    # Dysphagia  Dysphagia and aspiration risk assessed by speech language pathology. SLP recommended Modified Barium Swallow (completed on 6/21) which showed moderate pharyngeal aspiration; SLP recommends regular diet w/ mildly thickened liquids.   6/22 - Pt and daughter request to thicken liquids themselves at bedside for more variety, understand risk of aspiration w/ thins. Liberalized diet per their request to regular.   Appreciate SLP recs.   - See failure to thrive section as well re: PEG tube and nutritional deficiencies     # Pain  # Pressure Injury (sacrum)  Pain primarily r/t pressure injuries though occasionally also d/t abdominal cramping. Wound healing likely limited d/t poor nutritional status (see above) and  limited mobility (improving).  -Consulted Palliative Care 6/17 for SMS, now s/o  -Continue hydrocodone-acetaminophen PRN  -Continue hydromorphone per G tube PRN     # Physical Deconditioning  Pt motivated to participate w/ PT/OT, deconditioned after prior extended hospitalization (as above). Showing marked improvements in endurance w/ static siting balance & bed mobility.   - Continue PT/OT   - recommend SNF placement    #GOC  Pt is consistent  and clear that she wishes to pursue full medical management of her ongoing medical problems, as above, with the goal of prolonging her life. She is motivated by her desire to spend time with her family. This is consistent in conversations with [facility] (6/18) & Dr. Jacelyn Grip (6/21).     VTE PPx:  Contraindicated: Anticipated procedure    Severity of Illness  Stable    Nutritional Assessment  Severe Chronic disease or condition related malnutrition    During this hospitalization the patient is also being treated for:  - Anemia associated with malignancy (present on admission)    - Thrombocytopenia (present on admission)    - Coagulopathy (present on admission)    - Malignancy associated fatigue on admission  - Neoplasm/malignancy associated pain  - Hypoalbuminemia (present on admission)  - HYPOnatremia (present on admission)  - Fluid status: Volume overload on admission  - Cachexia  - Acute liver failure, present on admission    Code Status: FULL        I spent a total time of 70 minutes in this episode of care preparing to see the patient, obtaining and/or reviewing separately obtained history, performing a medically appropriate examination and/or evaluation, counseling and educating patient/family/caregiver, ordering and reviewing medications, tests, or procedures, referring and communicating with other health care professionals, documenting clinical information in the electronic or other health record, and communicating results to the patient/family/caregiver, and/or other care  coordination.    Gardiner Rhyme, NP  Hospital Identification #:  [id number]

## 2022-05-18 NOTE — Consults (Signed)
Hepatology Follow-up Consult Note     24 Hour Course  -- Increased spironolactone to 150 mg daily (from 100 mg) on 05/17/22  -- Increased furosemide to 60 mg daily (from 40 mg) on 05/17/22  -- Send stool alpha-1-antitrypsin level to rule out protein loosing enteropathy    Subjective  Feels overall okay and happy to be able to eat some food by mouth (ate 2 burgers, chicken, and snacks)    Objective    Medications  Current Facility-Administered Medications   Medication Dose Route Frequency Provider Last Rate Last Admin    0.9% sodium chloride flush injection syringe 3 mL  3 mL Intravenous Q12H [facility] [name], DO   3 mL at 05/18/22 1027    0.9% sodium chloride flush injection syringe 3 mL  3 mL Intravenous PRN [name], DO        acetaminophen (TYLENOL) tablet 650 mg  650 mg Per G Tube Q8H PRN [name], NP   650 mg at 05/11/22 0922    albuterol (PROVENTIL) 2.5 mg /3 mL (0.083 %) inhalation solution 2.5 mg  2.5 mg Nebulization Q4H PRN [name], NP        Or    albuterol (PROVENTIL) inhalation solution 2.5 mg  2.5 mg Nebulization Q4H PRN [name], NP        aluminum-magnesium hydroxide-simethicone (MAALOX PLUS) 200-200-20 mg/5 mL suspension 15 mL  15 mL Per G Tube Q4H PRN [name], NP   15 mL at 05/16/22 2244    cholecalciferol (vitamin D3) tablet 2,000 Units  2,000 Units Per G Tube Daily [facility] [name], MD   2,000 Units at 05/18/22 1007    cyanocobalamin (Vitamin B12) injection 1,000 mcg  1,000 mcg Subcutaneous Q7 Days [name], NP   1,000 mcg at 78/46/96 2952    folic acid (FOLVITE) tablet 1 mg  1 mg Per G Tube Daily [facility] [name], NP   1 mg at 05/18/22 1006    furosemide (LASIX) tablet 60 mg  60 mg Per G Tube Daily [place], NP   60 mg at 05/18/22 1007    HYDROcodone-acetaminophen (NORCO) 5-325 mg tablet 1-2 tablet  1-2 tablet Per G Tube Q6H PRN [name], NP   2 tablet at 05/17/22 2156    HYDROmorphone  (DILAUDID) tablet 1 mg  1 mg Oral Q4H PRN [name], NP   1 mg at 05/16/22 2313    hydrOXYzine (ATARAX) tablet 25 mg  25 mg Feeding Tube Q4H PRN [name], NP   25 mg at 05/18/22 0333    lansoprazole (PREVACID) suspension 30 mg  30 mg Per G Tube Q AM Before Breakfast [name] [name], NP   30 mg at 05/18/22 1016    letermovir (PREVYMIS) tablet 480 mg  480 mg Other Daily [name] [name], NP   480 mg at 05/18/22 1007    loperamide (IMODIUM A-D) tablet 4 mg  4 mg Feeding Tube Q8H [name], NP   4 mg at 05/18/22 1006    mirtazapine (REMERON) tablet 30 mg  30 mg Per G Tube Daily At Bedtime [facility] [name], MD   30 mg at 05/17/22 2156    multivitamin with iron-minerals-folic acid (THERA M PLUS) tablet 1 tablet  1 tablet Feeding Tube Daily [name], NP   1 tablet at 05/18/22 1007    ondansetron (ZOFRAN) tablet 8 mg  8 mg Per G Tube TID [name], NP   8 mg at 05/18/22 1010    opium tincture 6 mg  6 mg Oral 4x Daily PRN [name], NP   6 mg at 04/27/22 2219    prochlorperazine (COMPAZINE) injection 5 mg  5 mg Intravenous Q8H PRN [name], NP   5 mg at 05/17/22 6314    Or    prochlorperazine (COMPAZINE) tablet 5 mg  5 mg Per G Tube Q8H PRN [name], NP   5 mg at 05/12/22 0730    rifAXIMin (XIFAXAN) tablet 550 mg  550 mg Feeding Tube BID [name] [name], MD   550 mg at 05/18/22 1006    simethicone (MYLICON) drops 80 mg  80 mg Oral 4x Daily [name] [name], NP   80 mg at 05/18/22 1016    spironolactone (ALDACTONE) tablet 150 mg  150 mg Per G Tube Daily [place], NP   150 mg at 05/18/22 1006    thiamine mononitrate (vit B1) tablet 100 mg  100 mg Per G Tube Daily [name] [name], MD   100 mg at 05/18/22 1008    ursodiol (ACTIGALL) syrup 600 mg  600 mg Per G Tube BID [name] [name], MD   600 mg at 05/18/22 1010       Vitals  Temp:  [36.9 C (98.4 F)-37.2 C (99 F)] 37.1  C (98.8 F)  Heart Rate:  [76-82] 82  *Resp:  [16-18] 18  BP: (87-97)/(59-75) 94/70  Vitals:    05/17/22 2240 05/18/22 0335 05/18/22 0336 05/18/22 1009   BP:  (!) 87/59 91/70 94/70    BP Location:  Right upper arm Left upper arm Left upper arm   Patient Position:  Lying  Lying   Pulse:  81  82   Resp: 16 17  18    Temp:  36.9 C (98.4 F)  37.1 C (98.8 F)   TempSrc:  Oral  Oral   SpO2:  96%  97%   Weight:          GENERAL:  Well developed, NAD, normocephalic, atraumatic  HEENT:  Atraumatic, normocephalic, normal lips, teeth, and gum, moist mucous membranes  CV:  RRR, nml S1/S2, no m/r/g  LUNG: CTAB, no w/r/c  ABDOMEN: soft, ND, NT, normoactive BS, no g/r/r  EXT: no BLE edema, WWP  SKIN: no jaundice, spider angiomata, palmar erythema  NEURO: AAOx3, CN2-12 intact, no focal deficits  PSYCH: nml mood/affect    Intake/Output    Intake/Output Summary (Last 24 hours) at 05/18/2022 1433  Last data filed at 05/18/2022 1200  Gross per 24 hour   Intake 1160 ml   Output 455 ml   Net 705 ml     I/O last 3 completed shifts:  In: 1250 [P.O.:290; NG/GT:360; Feeding Tube:600]  Out: 1030 [Urine:1025; Drains/NG:5]  07/03 1901 - 07/04 1900  In: 1050 [P.O.:230]  Out: 355 [Urine:350; Drains/NG:5]    Data  Labs: I have personally reviewed and interpreted the following laboratory studies:    Recent Labs     05/18/22  0611 05/17/22  0559 05/16/22  0539   NA 135 136 137   K 4.1 4.4 4.2   CL 100* 101 101   CO2 28 29 30*   BUN 25 27* 28*   CREAT 0.65 0.62 0.63   GLU 114 115 111   CA 7.8* 7.9* 7.9*   MG 2.0 2.1 2.1  PO4 3.1 3.0 2.9     Recent Labs     05/18/22  0611 05/17/22  0559 05/16/22  0539   ALT 16 18 15    AST 47* 50* 38   ALKP 169* 181* 169*   TBILI 0.5 0.6 0.5     Recent Labs     05/18/22  0611 05/17/22  0559 05/16/22  0539   WBC 4.5 5.1 4.8   HGB 8.1* 8.4* 7.9*   HCT 26.4* 27.7* 25.5*   MCV 105* 105* 107*   PLT 154 160 147     Recent Labs     05/17/22  0559   INR 1.2   PT 15.1*     MELD-Na: 8 at 05/18/2022  6:11 AM  MELD: 8 at 05/18/2022   6:11 AM  Calculated from:  Serum Creatinine: 0.65 mg/dL (Using min of 1 mg/dL) at 05/18/2022  6:11 AM  Serum Sodium: 135 mmol/L at 05/18/2022  6:11 AM  Total Bilirubin: 0.5 mg/dL (Using min of 1 mg/dL) at 05/18/2022  6:11 AM  INR(ratio): 1.2 at 05/17/2022  5:59 AM      Microbiology:  Microbiology Results (last 24 hours)       Procedure Component Value Units Date/Time    Bacterial Culture, Normally Sterile Sites, With Gram Stain [[id number]] Collected: 05/11/22 1115    Order Status: Completed Specimen: Not Applicable from Peritoneal Fluid Updated: 05/18/22 1243     Gram Stain Rare Host cells (not PMNs, RBCs, or squamous epithelial cells) , No PMNs seen      No organisms seen     Bacterial Culture, Sterile Sites No growth 7 days.    Cytomegalovirus DNA, Quantitative PCR, Plasma [[id number]] Collected: 05/18/22 0611    Order Status: Sent Specimen: Blood from Serum Updated: 05/18/22 0706            Radiology:   Radiology Results (last 24 hours)       ** No results found for the last 24 hours. **          Radiology  US ABDOMEN LIMITED WITH DOPPLER ON 05/16/2022  FINDINGS:   Liver:  Spans 14.1 cm. Coarse and diffusely hyperechoic parenchyma with mild surface nodularity.  Bile ducts: The common bile duct measures 0.5 cm. No intrahepatic bile duct dilation.  Gallbladder:  Cholelithiasis with mild wall thickening, likely reactive. No focal tenderness.  Spleen: 13.3 cm, previously 9.7 cm.     Doppler findings:  Splenic vein: Patent with normal direction of flow.  Main portal vein diameter: 1.1 cm  Portal veins: Patent with normal direction of flow.  Hepatic artery: Patent with normal arterial waveforms.  Hepatic veins: Patent with phasic waveforms.  Inferior vena cava: Patent with phasic flow.     Increased now moderate volume 4 quadrant ascites. Trace right pleural effusion.     IMPRESSION:   1.  Mild liver surface nodularity compatible with early cirrhosis and/or fibrosis. Patent vasculature with normal direction of flow.  2.  Sequela  of portal hypertension including increased now moderate ascites and splenomegaly.     3.  Cholelithiasis.  Endoscopy  SIGMOIDOSCOPY ON 12/17/2021  IMPRESSION:  1.  Edematous colonic wall throughout with patches of  erythema.  Friable tissue. Multiple biopsies were taken to rule out  CMV.   2. Retroflexion was not performed     Pathology:  LIVER BIOPSY ON 12/11/2021  FINAL PATHOLOGIC DIAGNOSIS  Liver, transjugular biopsy:  1.  Steatohepatitis with pericentral/sinusoidal and periportal fibrosis; see comment.  2.  Ductular reaction with focal pericholangitis; see comment.     COMMENT:  The biopsy shows severe steatosis with pericentral/sinusoidal and periportal fibrosis, histiocytic and neutrophilic lobular inflammation, Mallory hyaline as well as rare ballooned hepatocytes. The findings are consistent with steatohepatitis (stage 2, scale 0-4, NASH-CRN method). Clinical correlation with steatohepatitis risk factors is suggested, including for any history of massive weight loss which may lead to an aggressive onset of steatohepatitis.The history of dexamethasone use is noted; corticosteroid use has been associated with exacerbation of pre-existing fatty liver disease.     Additionally, there is ductular reaction with focal pericholangitis, raising consideration for a duct obstructive process. Clinical correlation is advised. The concern for other drug-induced liver injury is noted. In particular, thalidomide derivatives (such as pomalidomide) have been reported in association with vanishing bile duct syndrome (VBDS) [3]; however, there is no convincing duct loss to suggest VBDS. There are no clusters of plasma cells seen to suggest involvement by myeloma.    Assessment/Plan:   Ariel Braun is a 61 year old man with IgG kappa multiple myeloma in 08/2010 with lytic spine lesions s/p autologous stem cell transplant in 2012 s/p daratumumab/pomalyst/dexamethasone (last 10/2021 due to diarrhea), PE s/p apixaban, hereditary  angioedema, chronic GERD, MDD/GAD, chronic diarrhea w/ norovirus (09/2021 c/b hypovolemic shock), and history of acute liver injury s/p transjugular liver biopsy in 11/2021 with mild elevated HVPG 8 mmHg c/f drug-induced liver injury in the setting of NASH, now with ongoing ascites requiring weekly LVP in the setting of protein caloric malnutrition s/p PEG-dependent on tube feeds. Hepatology consulted for further management of her ascites.      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 Unfortunately, she has developed complications as below.   -- Monitor MELDNa at every other is okay     # Ascites, large, high SAAG >1.1 and low protein <1.5, without history of spontaneous bacterial peritonitis, chronic/recurrent  History of ascites with high SAAG >1.1 and low protein <1.5 suggestive of liver-related portal hypertension, without history of SBP on blood cultures. Other etiologies to consider malignancy/infection, nephrotic syndrome, and protein caloric enteropathy, though less likely given high SAAG ascites. No other evidence of cardiac etiology and/or nephrotic syndrome. Given weekly LVP, would increase diuretic regimen as tolerated per renal function. If persistent despite max diuretics, then would also consider TIPS given low MELDNa and mild hepatic encephalopathy.  -- Increase furosemide to 60 mg daily from 40 mg daily (max dose is 160 mg/day)  -- Increase spironolactone to 150 mg daily from 100 mg daily  (max dose is 400 mg/day)  -- If tolerated, then increase using 2:5 ratio of furosemide and spironolactone every 3-4 days while monitoring serum sodium and Cr  -- Although TIPS are usually consider potential treatment for refractory ascites requiring LVP,  her HVPG was only 8 mmHg in 11/2021, thus may not benefit with further decreased in HVPG and would rather prioritize in optimizing nutrition for improve hypoalbuminemia  -- Send stool alpha-1-antitrypsin to rule out protein loosing enteropathy   -- Continue to optimize nutrition/tube feeds to improve albumin as hypoalbuminemia is contributory to recurrent large ascites due to  low oncotic pressure and decreased effectiveness of diuretics  -- Continue low sodium diet <2 g/day     # Hepatic encephalopathy, mild  -- Continue rifaximin 550 mg bid amd lactulose 10 g tid, though latter limited due to chronic diarrhea     # Portal hypertension 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 Liane Comber.    Thank you for this consult, we will continue to follow with you.  Please don't hesitate to page [phone] with any questions or changes in the patient's clinical status.      Gardiner Rhyme, MD, MAS  Resident

## 2022-05-18 NOTE — Plan of Care (Signed)
Problem: Discharge Planning - Adult  Goal: Knowledge of and participation in plan of care  Outcome: Progress within 12 hours     Problem: Fall, at Risk or Actual - Adult  Goal: Absence of falls and fall related injury  Outcome: Progress within 12 hours     Problem: Delirium - Adult / Pediatric  Goal: Absence or resolution of delirium  Outcome: Progress within 12 hours     Problem: Nutrition, Alteration in -Adult  Goal: Adequate nutritional intake  Outcome: Progress within 12 hours  Goal: Maximize nutritional intake per patient condition  Outcome: Progress within 12 hours     Problem: Pressure Injury,At Risk and Actual- Adult  Goal: Absence of any new pressure injury  Outcome: Progress within 12 hours  Goal: Pressure injury healing/stabilization  Outcome: Progress within 12 hours  Goal: Participation in preventative efforts and treatment plan (Patient/Family/Caregiver)  Outcome: Progress within 12 hours  Goal: Adequate nutritional intake  Outcome: Progress within 12 hours     Problem: GI Elimination Impaired - Adult  Goal: Passage of soft, formed stool  Outcome: Progress within 12 hours  Goal: Stool elimination per clinical condition ( e.g. GI ostomies )  Outcome: Progress within 12 hours

## 2022-05-19 LAB — ECG 12-LEAD
Atrial Rate: 76 {beats}/min
Calculated P Axis: 52 degrees
Calculated R Axis: -22 degrees
Calculated T Axis: -3 degrees
P-R Interval: 132 ms
QRS Duration: 70 ms
QT Interval: 416 ms
QTcb: 468 ms
Ventricular Rate: 76 {beats}/min

## 2022-05-19 LAB — COMPLETE BLOOD COUNT WITH DIFFERENTIAL
Abs Basophils: 0.02 10*9/L (ref 0.00–0.10)
Abs Eosinophils: 0.09 10*9/L (ref 0.00–0.40)
Abs Imm Granulocytes: 0.09 10*9/L (ref <0.10)
Abs Lymphocytes: 0.65 10*9/L — ABNORMAL LOW (ref 1.00–3.40)
Abs Monocytes: 0.52 10*9/L (ref 0.20–0.80)
Abs Neutrophils: 3.44 10*9/L (ref 1.80–6.80)
Hematocrit: 26.1 % — ABNORMAL LOW (ref 36.0–46.0)
Hemoglobin: 8.3 g/dL — ABNORMAL LOW (ref 12.0–15.5)
MCH: 32.7 pg (ref 26.0–34.0)
MCHC: 31.8 g/dL (ref 31.0–36.0)
MCV: 103 fL — ABNORMAL HIGH (ref 80–100)
MPV: 12.1 fL (ref 9.1–12.6)
Platelet Count: 149 10*9/L (ref 140–450)
RBC Count: 2.54 10*12/L — ABNORMAL LOW (ref 4.00–5.20)
RDW-CV: 16.9 % — ABNORMAL HIGH (ref 11.7–14.4)
WBC Count: 4.8 10*9/L (ref 3.4–10.0)

## 2022-05-19 LAB — MAGNESIUM, SERUM / PLASMA: Magnesium, Serum / Plasma: 1.9 mg/dL (ref 1.6–2.6)

## 2022-05-19 LAB — BILIRUBIN, TOTAL: Bilirubin, Total: 0.5 mg/dL (ref 0.2–1.2)

## 2022-05-19 LAB — PHOSPHORUS, SERUM / PLASMA: Phosphorus, Serum / Plasma: 3.3 mg/dL (ref 2.3–4.7)

## 2022-05-19 LAB — BASIC METABOLIC PANEL (NA, K, CL, CO2, BUN, CR, GLU, CA)
Anion Gap: 5 (ref 4–14)
Calcium, total, Serum / Plasma: 7.9 mg/dL — ABNORMAL LOW (ref 8.4–10.5)
Carbon Dioxide, Total: 27 mmol/L (ref 22–29)
Chloride, Serum / Plasma: 101 mmol/L (ref 101–110)
Creatinine: 0.64 mg/dL (ref 0.55–1.02)
Glucose, non-fasting: 126 mg/dL (ref 70–199)
Potassium, Serum / Plasma: 4.2 mmol/L (ref 3.5–5.0)
Sodium, Serum / Plasma: 133 mmol/L — ABNORMAL LOW (ref 135–145)
Urea Nitrogen, Serum / Plasma: 24 mg/dL (ref 7–25)
eGFRcr: 100 mL/min/{1.73_m2} (ref >59)

## 2022-05-19 LAB — ALANINE TRANSAMINASE: Alanine transaminase: 17 U/L (ref 10–61)

## 2022-05-19 LAB — ALKALINE PHOSPHATASE: Alkaline Phosphatase: 182 U/L — ABNORMAL HIGH (ref 38–108)

## 2022-05-19 LAB — ASPARTATE TRANSAMINASE: AST: 44 U/L (ref 5–44)

## 2022-05-19 LAB — CYTOMEGALOVIRUS DNA, QUANTITATIVE PCR, PLASMA
CMV DNA (IU/mL): 50 IU/mL — ABNORMAL HIGH (ref <30)
CMV DNA (Log IU/mL): 1.7 — ABNORMAL HIGH (ref <1.48)

## 2022-05-19 MED ORDER — LETERMOVIR 480 MG TABLET
480 | ORAL | Status: DC
Start: 2022-05-19 — End: 2022-05-19

## 2022-05-19 MED ORDER — CHOLECALCIFEROL (VITAMIN D3) 25 MCG (1,000 UNIT) TABLET
1000 | Freq: Every day | ORAL | Status: DC
Start: 2022-05-19 — End: 2023-07-01

## 2022-05-19 MED ORDER — SPIRONOLACTONE 50 MG TABLET
50 | Freq: Every day | ORAL | 2.00 refills | 30.00000 days | Status: DC
Start: 2022-05-19 — End: 2023-03-01

## 2022-05-19 MED ORDER — LETERMOVIR 480 MG TABLET
480 mg | ORAL_TABLET | ORAL | 3 refills | Status: DC
Start: 2022-05-19 — End: 2022-11-24

## 2022-05-19 MED ORDER — MIRTAZAPINE 30 MG TABLET
30 | Freq: Every day | ORAL | 2.00 refills | 30.00000 days | Status: DC
Start: 2022-05-19 — End: 2023-06-29

## 2022-05-19 MED ORDER — FUROSEMIDE 20 MG TABLET
20 | Freq: Every day | ORAL | Status: DC
Start: 2022-05-19 — End: 2023-03-01

## 2022-05-19 MED ORDER — PROCHLORPERAZINE MALEATE 5 MG TABLET
5 | Freq: Three times a day (TID) | ORAL | Status: DC | PRN
Start: 2022-05-19 — End: 2023-07-01

## 2022-05-19 MED ORDER — HYDROCODONE 5 MG-ACETAMINOPHEN 325 MG TABLET
5-325 | Freq: Four times a day (QID) | ORAL | 0 refills | Status: DC | PRN
Start: 2022-05-19 — End: 2022-05-27

## 2022-05-19 MED ORDER — THIAMINE MONONITRATE (VITAMIN B1) 100 MG TABLET
100 | Freq: Every day | ORAL | Status: DC
Start: 2022-05-19 — End: 2023-07-01

## 2022-05-19 MED ORDER — URSODIOL 60 MG/ML ORAL SYRUP
60 | Freq: Two times a day (BID) | ORAL | Status: DC
Start: 2022-05-19 — End: 2023-03-01

## 2022-05-19 MED ORDER — HYDROXYZINE HCL 25 MG TABLET
25 | ORAL | Status: DC | PRN
Start: 2022-05-19 — End: 2023-05-19

## 2022-05-19 MED ORDER — CYANOCOBALAMIN (VIT B-12) 1,000 MCG/ML INJECTION SOLUTION
1000 | INTRAMUSCULAR | 0.00 refills | 37.50000 days | Status: DC
Start: 2022-05-19 — End: 2022-05-24

## 2022-05-19 MED ORDER — MULTIVITAMIN-IRON 9 MG-FOLIC ACID 400 MCG-CALCIUM AND MINERALS TABLET
9 | Freq: Every day | ORAL | Status: DC
Start: 2022-05-19 — End: 2023-07-01

## 2022-05-19 MED ORDER — ONDANSETRON 4 MG DISINTEGRATING TABLET
4 | Freq: Once | ORAL | Status: AC | PRN
Start: 2022-05-19 — End: 2022-05-19
  Administered 2022-05-19: 08:00:00 via GASTROSTOMY

## 2022-05-19 MED ORDER — SIMETHICONE 40 MG/0.6 ML ORAL DROPS,SUSPENSION
40 | Freq: Four times a day (QID) | ORAL | 0.00 refills | 2.00000 days | Status: DC
Start: 2022-05-19 — End: 2023-03-01

## 2022-05-19 MED ORDER — LANSOPRAZOLE 3 MG/ML ORAL SUSPENSION COMPOUNDING KIT
3 | Freq: Every morning | ORAL | Status: DC
Start: 2022-05-19 — End: 2023-02-24

## 2022-05-19 MED ORDER — ONDANSETRON HCL 8 MG TABLET
8 | Freq: Three times a day (TID) | ORAL | Status: DC
Start: 2022-05-19 — End: 2023-07-01

## 2022-05-19 MED ORDER — RIFAXIMIN 550 MG TABLET
550 | Freq: Two times a day (BID) | ORAL | Status: DC
Start: 2022-05-19 — End: 2022-05-27

## 2022-05-19 MED ORDER — LOPERAMIDE 2 MG TABLET
2 | ORAL | Status: DC
Start: 2022-05-19 — End: 2023-03-01

## 2022-05-19 MED ORDER — FOLIC ACID 1 MG TABLET
1 | Freq: Every day | ORAL | Status: DC
Start: 2022-05-19 — End: 2023-07-01

## 2022-05-19 MED FILL — HYDROCODONE 5 MG-ACETAMINOPHEN 325 MG TABLET: 5-325 mg | ORAL | Qty: 2

## 2022-05-19 MED FILL — THIAMINE MONONITRATE (VITAMIN B1) 100 MG TABLET: 100 mg | ORAL | Qty: 1

## 2022-05-19 MED FILL — FUROSEMIDE 20 MG TABLET: 20 mg | ORAL | Qty: 3

## 2022-05-19 MED FILL — URSODIOL (BULK) 100 % POWDER: 100 % | Qty: 0.6

## 2022-05-19 MED FILL — SIMETHICONE 40 MG/0.6 ML ORAL DROPS,SUSPENSION: 40 mg/0.6 mL | ORAL | Qty: 1.2

## 2022-05-19 MED FILL — ONDANSETRON 4 MG DISINTEGRATING TABLET: 4 mg | ORAL | Qty: 1

## 2022-05-19 MED FILL — SPIRONOLACTONE 25 MG TABLET: 25 mg | ORAL | Qty: 2

## 2022-05-19 MED FILL — ANTI-DIARRHEAL (LOPERAMIDE) 2 MG TABLET: 2 mg | ORAL | Qty: 2

## 2022-05-19 MED FILL — CHOLECALCIFEROL (VITAMIN D3) 25 MCG (1,000 UNIT) TABLET: 1000 UNITS | ORAL | Qty: 2

## 2022-05-19 MED FILL — XIFAXAN 550 MG TABLET: 550 mg | ORAL | Qty: 1

## 2022-05-19 MED FILL — ONDANSETRON HCL 4 MG TABLET: 4 mg | ORAL | Qty: 2

## 2022-05-19 MED FILL — THERA M PLUS (FERROUS FUMARATE) 9 MG IRON-400 MCG TABLET: 9 mg iron-400 mcg | ORAL | Qty: 1

## 2022-05-19 MED FILL — PROCHLORPERAZINE EDISYLATE 10 MG/2 ML (5 MG/ML) INJECTION SOLUTION: 10 mg/2 mL (5 mg/mL) | INTRAMUSCULAR | Qty: 2

## 2022-05-19 MED FILL — MIRTAZAPINE 30 MG TABLET: 30 mg | ORAL | Qty: 1

## 2022-05-19 MED FILL — FOLIC ACID 1 MG TABLET: 1 mg | ORAL | Qty: 1

## 2022-05-19 MED FILL — PROCHLORPERAZINE MALEATE 5 MG TABLET: 5 mg | ORAL | Qty: 1

## 2022-05-19 MED FILL — LANSOPRAZOLE 3 MG/ML ORAL SUSPENSION COMPOUNDING KIT: 3 mg/mL | ORAL | Qty: 10

## 2022-05-19 MED FILL — PREVYMIS 480 MG TABLET: 480 mg | ORAL | Qty: 1

## 2022-05-19 NOTE — Plan of Care (Signed)
Problem: Discharge Planning - Adult  Goal: Knowledge of and participation in plan of care  Outcome: Progress within 12 hours     Problem: Fall, at Risk or Actual - Adult  Goal: Absence of falls and fall related injury  Outcome: Progress within 12 hours     Problem: Delirium - Adult / Pediatric  Goal: Absence or resolution of delirium  Outcome: Progress within 12 hours     Problem: Nutrition, Alteration in -Adult  Goal: Adequate nutritional intake  Outcome: Progress within 12 hours  Goal: Maximize nutritional intake per patient condition  Outcome: Progress within 12 hours     Problem: Pressure Injury,At Risk and Actual- Adult  Goal: Absence of any new pressure injury  Outcome: Progress within 12 hours  Goal: Pressure injury healing/stabilization  Outcome: Progress within 12 hours  Goal: Participation in preventative efforts and treatment plan (Patient/Family/Caregiver)  Outcome: Progress within 12 hours  Goal: Adequate nutritional intake  Outcome: Progress within 12 hours     Problem: GI Elimination Impaired - Adult  Goal: Passage of soft, formed stool  Outcome: Progress within 12 hours  Goal: Stool elimination per clinical condition ( e.g. GI ostomies )  Outcome: Progress within 12 hours

## 2022-05-19 NOTE — Plan of Care (Signed)
Problem: Discharge Planning - Adult  Goal: Knowledge of and participation in plan of care  Outcome: Progress within 12 hours     Problem: Fall, at Risk or Actual - Adult  Goal: Absence of falls and fall related injury  Outcome: Progress within 12 hours     Problem: Delirium - Adult / Pediatric  Goal: Absence or resolution of delirium  Outcome: Progress within 12 hours     Problem: Nutrition, Alteration in -Adult  Goal: Adequate nutritional intake  Outcome: Progress within 12 hours  Goal: Maximize nutritional intake per patient condition  Outcome: Progress within 12 hours     Problem: Pressure Injury,At Risk and Actual- Adult  Goal: Absence of any new pressure injury  Outcome: Progress within 12 hours  Goal: Pressure injury healing/stabilization  Outcome: Progress within 12 hours  Goal: Participation in preventative efforts and treatment plan (Patient/Family/Caregiver)  Outcome: Progress within 12 hours  Goal: Adequate nutritional intake  Outcome: Progress within 12 hours

## 2022-05-19 NOTE — Progress Notes (Addendum)
[facility] PROGRESS NOTE     This is an independent service.   The available consultant for this service is Marion Downer, MD.           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  Had episode of nausea overnight received compazine and zofran with resolution.  Patient hopeful to be able to go off unit with PT    Vitals  Temp:  [36.6 C (97.8 F)-37.4 C (99.4 F)] 36.8 C (98.2 F)  Heart Rate:  [74-80] 75  *Resp:  [16-18] 18  BP: (87-96)/(61-76) 92/76  SpO2:  [97 %-99 %] 98 %    MostRecent Weight: 67.4 kg (148 lb 9.4 oz)  Admission Weight: 66.2 kg (146 lb)      Intake/Output Summary (Last 24 hours) at 05/19/2022 1721  Last data filed at 05/19/2022 1340  Gross per 24 hour   Intake 1700 ml   Output 550 ml   Net 1150 ml       Physical Exam  Gen: NAD, appropriate and engaged  HEENT: MMM, no oral lesions, sclera non-icteric  Resp: CTAB, no rhonchi/wheezing  Card: RRR  Gi: distended, soft, non-tender, +bs x4  Ext: negative BLE edema   Skin: scattered ecchymosis to BUE  Neuro: Alert, oriented x3, no focal neuro deficits  Lines: PIV x1 in place, clean dry & intact dressing    Scheduled Meds:   0.9% sodium chloride flush  3 mL Intravenous Q12H [place]    cholecalciferol (vitamin D3)  2,000 Units Per G Tube Daily [place]    cyanocobalamin (Vitamin B12)  1,000 mcg Subcutaneous Q7 Days    folic acid  1 mg Per G Tube Daily [place]    furosemide  60 mg Per G Tube Daily [place]    lansoprazole  30 mg Per G Tube Q  AM Before Breakfast [facility]    letermovir  480 mg Other Daily [place]    loperamide  4 mg Feeding Tube Q8H    mirtazapine  30 mg Per G Tube Daily At Bedtime [facility]    multivitamin with iron-minerals-folic acid  1 tablet Feeding Tube Daily SCH    ondansetron  8 mg Per G Tube TID    rifAXIMin  550 mg Feeding Tube BID SCH    simethicone  80 mg Oral 4x Daily [place]    spironolactone  150 mg Per G Tube Daily [place]    thiamine mononitrate (vit B1)  100 mg Per G Tube Daily [place]    ursodiol  600 mg Per G Tube BID [place]     Continuous Infusions:  PRN Meds:   0.9% sodium chloride flush  3 mL Intravenous PRN    acetaminophen  650 mg Per G Tube Q8H PRN    albuterol  2.5 mg Nebulization Q4H PRN    Or    albuterol  2.5 mg Nebulization Q4H PRN    aluminum-magnesium hydroxide-simethicone  15  mL Per G Tube Q4H PRN    HYDROcodone-acetaminophen  1-2 tablet Per G Tube Q6H PRN    HYDROmorphone  1 mg Oral Q4H PRN    hydrOXYzine  25 mg Feeding Tube Q4H PRN    opium  6 mg Oral 4x Daily PRN    prochlorperazine  5 mg Intravenous Q8H PRN    Or    prochlorperazine  5 mg Per G Tube Q8H PRN     Data  CBC        05/19/22  0727 05/18/22  0611   WBC 4.8 4.5   HGB 8.3* 8.1*   HCT 26.1* 26.4*   PLT 149 154     Coags  No results found in last 36 hours    Chem7        05/19/22  0727 05/18/22  0611   NA 133* 135   K 4.2 4.1   CL 101 100*   CO2 27 28   BUN 24 25   CREAT 0.64 0.65   GLU 126 114     Electrolytes        05/19/22  0727 05/18/22  0611   CA 7.9* 7.8*   MG 1.9 2.0   PO4 3.3 3.1     Liver Panel        05/19/22  0727 05/18/22  0611   AST 44 47*   ALT 17 16   ALKP 182* 169*   TBILI 0.5 0.5     UA  No results found in last 7 days    Microscopy:  No results found in last 7 days    Microbiology Results (last 72 hours)       Procedure Component Value Units Date/Time    Cytomegalovirus DNA, Quantitative PCR, Plasma [[id number]]  (Abnormal) Collected: 05/18/22 0611    Order Status: Completed Specimen: Blood from Serum Updated: 05/19/22 1626     CMV DNA (IU/mL) 50 IU/mL       Comment: *SUBCRITICAL*  Performed by Real-Time PCR  Note new reference range.          CMV DNA (Log IU/mL) 1.70     Comment: Log10 IU/mL  Note new reference range.         Bacterial Culture, Normally Sterile Sites, With Gram Stain [[id number]] Collected: 05/11/22 1115    Order Status: Completed Specimen: Not Applicable from Peritoneal Fluid Updated: 05/19/22 1239     Gram Stain Rare Host cells (not PMNs, RBCs, or squamous epithelial cells) , No PMNs seen      No organisms seen     Bacterial Culture, Sterile Sites No growth 8 days.          Radiology Results  No results found.    I discussed the patient with Marion Downer, MD from [facility] regarding A&P.    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  2011: VRd bortezomib, Lenalidomide, Dexamethasone  04/2011: Auto SCT  07/2014: Rd - revlimid 15 mg every other day, dexamethasone 8 mg PO weekly.   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  Receiving dexamethasone 12 mg weekly for diarrhea as outpt  10/16/21 - last Daratumumab dose (on hold due to diarrhea)     DATA:   09/07/2010: IgG Kappa MM  12/02/2021: Ig M <5, IgG 447, IgA 20, Ig E <2, SFLC Kappa 16.6, Lambda 1.6, Kappa/Lamda 10.38  12/14/21: IgG K on IFE, IgG 430, KLC 16.6, LLC 2.7, KLR 6.15  02/23/22 SPEP with stable M-spike, SFLC decreased Kappa 44.2 / Lambda 5.5 = K/L ratio = 8.04 (overall downtrending still)  04/23/22 M-spike 0.6, IgG K on IFE, IgG 997, Kappa 70.7/Lambda 19.8 = K/L ratio = 3.57  Monitor MM labs monthly per Dr Jacelyn Grip, next due 7/9     Chemo:  TBD, therapy held since 10/2021 due to recurrent infections and now  deconditioning  Not currently a candidate due to significant deconditioning, may be candidate in the future if improved.     Supportive Care:   Transfuse for Hgb <7 and Platelets <10  Access: PIV     Primary Oncologist:   Dr. Jacelyn Grip ([place]), will transfer to Dr. Hassell Done ([place])  Dr. Iona Coach ([place])     Disposition:  Lives in [place] - anticipate discharge to [place] insurance athorization per [facility] 6/29  '[ ]'$  will need paracentesis q7-10 days coordinated outpatient with IR and transportation arranged with family - needs paracentesis prior to discharge -     # Immunocompromised  # Diarrhea - controlled  # c/b CMV Viremia  # Hx prolonged norovirus infection  Immunocompromised due to underlying  malignancy, not currently neutropenic. Recent extended hospitalization at both [facility] 1/23-04/16/22, with hypovolemic shock associated with norovirus, CMV colitis, acute liver failure associated with pomylast (see ALF section).  Currently admitted for ongoing diarrhea and FTT in brief time at home after dc from [facility], initially began in 8/22, recently managed at home w/ loperamide BID w/ 2-3 watery stools daily. Diarrhea worsened on 6/12 (11 stools). Possibly r/t to inadequate dosing of loperamide vs infectious etiology. GI c/s 6/13 for possible scope - per patient and family declined endoscopy at this time. ID consultation deferred as infectious w/u negative.   Given CMV improving, norovirus negative, and enteric panel unrevealing - favor non-infectious etiologies for diarrhea. ID has requested we maintain enteric precautions for hospitalization d/t prolonged prior GI infections.   Diarrhea ~4 stools/day as of 7/1, improved to 1 soft/formed stool/d on 7/3.Mar[place] Kitchen     Data  - Norovirus  - First +norovirus 08/2021 with persistent +norovirus s/p 7-day courses x 2 nitazoxanide (2/16-02/22/23 & 01/2022). Continued to be norovirus positive (most recently on 04/07/22). Norovirus, cdiff, GI PCR neg this  admission (6/9).   - CMV  -Regarding CMV colitis, 12/17/21 flex sig showed CMV colitis, sp multiple cycles of gancyclovir IV, IVIG, valcyte 900 mg PO BID, then ultimately transitioned to letermovir, which she continues. 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   # Hepatic Encephalopathy  Steady rise in LFTs began 05/2021. Acute rise in 11/2021,coinciding with acute CMV viremia but liver biopsy CMV stains negative (12/11/21). 12/14/21: Hepatitis/viral work-up negative in 11/2021. LP was done on 12/16/21 for hepatic encephalopathy w/ CMV CSF PCR negative. Suspected to be 2/2 pomalidamide, though liver path from 12/22/21 unlikely for vanishing bile duct syndrome.   Upon admission was more somnolent and one episode of possible visual hallucination reported on 6/23. Her mentation has improved throughout this hospitalization  As of 7/3 AxOx3, anticipate repeat para prior to discharging to SNF. C/s to Hepatology (7/2) regarding optimizing medical management and improving ascites: ascites felt to be 2/2 portal HTN and malnutrition (see below). Goal will be to reduce paracentesis need to 1x/month by optimizing diuresis and improving nutritional status. Appreciate recs.    Dx:  - 6/17 KUB: mild ileus and density over abdomen (likely ascitic fluid) - not clinically significant as patient is having regular bowel movements and no PVR noted.  - 6/20 therapeutic  paracentesis by TAPS, -4L + albumin  - 6/23 ammonia normal  - 6/27 paracentesis by TAPS -5L + albumin, SBP unrevealing or infection. cytology negative for malignancy  - 7/2 RUQ u/s w/ doppler: Mild liver surface nodularity c/w early cirrhosis and/or fibrosis. Patent vasculature with normal direction of flow. Sequela of portal hypertension including increased now moderate ascites and splenomegaly. Cholelithiasis.    Plan:  -Appreciate hepatology recs:  -Continue spironolactone 150 mg + furosemide 60 mg to minimize accumulation of ascites   (started 6/16, increased 6/19-, last incr 7/3- )  -Hold for SBP<90 or MAP<60  -goal of optimizing so that paracentesis requirement is improved to 1x/month  -Currently requiring paracentesis q7-10d - will need to be arranged for d/c   -Follow up Alpha 1 antitrypsin stool study sent 7/4 '[ ]'$  - rule out malabsorption disorder   -Continue Ursodiol 600 mg PO BID  -Continue rifaximin 550 mg PO BID  -Low sodium diet - challenging in context of tube feeds  -2L fluid restriction  -Delirium precautions     # Failure to thrive  # PEG tube dependence  # Macrocytic anemia - B12 deficiency  PEG tube placed during last admission. Low B12 level but MMA normal, which is not consistent with B12 deficiency, however still treating at this time as below. Encouraged PO intake in addition to tube feeds. Transitioned to cyclic tube feeds on 3/33.  6/29 - tolerating cyclic TF and improving oral intake  7/3 - tolerating more oral intake, including solids    Plan:  -Continue cyclic TF - Nutren 1.5 @ 75 mL x 16 hours (1800-1000) - TID residuals per RN protocol   -RD notified 7/5 patient and daughter requesting discussion for additional   -Continue Thera-M Plus MVI for copper deficiency (Copper = 47 on 6/16)  -Continue mirtazapine '30mg'$  nightly (incr 6/18- ) per palliative recs  -Continue cyanocobalamin 1033mg SQ weekly (6/24- )   -s/p Cyanocobalamin 10064m SQ daily x7 days (6/17-6/23)  -s/p Zinc acetate  6/11-6/23)    # Dysphagia  Dysphagia and aspiration risk assessed by speech language pathology. SLP recommended Modified Barium Swallow (completed on 6/21) which showed moderate pharyngeal aspiration; SLP recommends regular diet w/ mildly thickened liquids.   6/22 - Pt and daughter request to thicken liquids themselves at bedside for more variety, understand risk of aspiration w/ thins. Liberalized diet per their request to regular.   Appreciate SLP  recs.   - See failure to thrive section as well re: PEG tube and nutritional deficiencies     # Pain  # Pressure Injury (sacrum)  Pain primarily r/t pressure injuries though occasionally also d/t abdominal cramping. Wound healing likely limited d/t poor nutritional status (see above) and limited mobility (improving).  -Consulted Palliative Care 6/17 for SMS, now s/o  -Continue hydrocodone-acetaminophen PRN  -Continue hydromorphone per G tube PRN     # Physical Deconditioning  Pt motivated to participate w/ PT/OT, deconditioned after prior extended hospitalization (as above). Showing marked improvements in endurance w/ static siting balance & bed mobility.   - Continue PT/OT   - recommend SNF placement    # GOC  Pt is consistent and clear that she wishes to pursue full medical management of her ongoing medical problems, as above, with the goal of prolonging her life. She is motivated by her desire to spend time with her family. This is consistent in conversations with [facility] (6/18) & Dr. Jacelyn Grip (6/21).     VTE PPx:  Contraindicated: Anticipated procedure    Severity of Illness  Stable    Nutritional Assessment  Severe Chronic disease or condition related malnutrition    During this hospitalization the patient is also being treated for:  - Anemia associated with malignancy (present on admission)    - Thrombocytopenia (present on admission)    - Coagulopathy (present on admission)    - Malignancy associated fatigue on admission  - Neoplasm/malignancy associated pain  -  Hypoalbuminemia (present on admission)  - HYPOnatremia (present on admission)  - Fluid status: Volume overload on admission  - Cachexia  - Acute liver failure, present on admission    Code Status: FULL      I spent a total time of 70 minutes in this episode of care preparing to see the patient, obtaining and/or reviewing separately obtained history, performing a medically appropriate examination and/or evaluation, counseling and educating patient/family/caregiver, ordering and reviewing medications, tests, or procedures, referring and communicating with other health care professionals, documenting clinical information in the electronic or other health record, and communicating results to the patient/family/caregiver, and/or other care coordination.    Gardiner Rhyme, NP  Hospital Identification #:  [id number]

## 2022-05-19 NOTE — Consults (Signed)
OCCUPATIONAL THERAPY PROGRESS NOTE      Diagnosis and brief medical history: 15F MM, chronic diarrhea (CMV colitis vs norovirus infection), acute liver injury, admitted for persistent diarrhea and FTT    Assessment and Treatment Plan    OT Progress summary: Pt agreeable and excited to go outside for the first time in 27 days. Pt tolerated hoyer lift to w/c transfer well and able to assist with remote and lowering. Pt self identified oral care during session today> pt tolerated 30 min of therapeutic activity on 6L patio with family. Pt reported enjoying being outside and engaging in strengthening. Recommend placement.  Progress with OT: Good progress  Current maximal level of assist: Maximal assist  Next session focus:      Recommendations    Recommended Discharge Disposition Placement for continued therapy   Discharge DME Recommendations Defer to next level of care   Equipment Recommendations     DME Comment     Discharge Recommendations Comment     Rehab Potential Patient participates well in therapy and is progressing towards goal   Anticipated Assistance Available at Discharge Family;Spouse/Significant other   Anticipated Type of Assistance Available at Discharge Assist as needed   Anticipated Time of Assistance Available at Discharge     Barriers to Discharge Medical issues   Patient's current functional ability appropriate for D/C recommendations Yes     Inpatient Recommendations  OT Inpatient Recommendations     Recommendation Comments Hoyer lift to high back chair or wheelchair   Current Maximal Level of Assist Needed Maximal assist     Brace/Precautions   Brace/Orthotic/Prosthetic:   Precautions:Has weight bearing limitation or precaution: Yes  Precautions and weight bearing status comments: Falls, delirium, skin, aspiration, contact     Subjective Report:"I think I smell the ocean, I love the smell of the ocean"    Patient/Family Goal:     Objective Findings and Interventions    Areas of  Occupation    Grooming and [place];Set up   Cueing Required     Comment Brush teeth and wash face   Self Feeding     Cueing Required     Comment     Toileting Minimal assistance   Cueing Required     Toileting Clothing Management Maximal assistance   Cueing Required     Comment min A  rolling and max A pericare   Upper Body Dressing Minimal assistance   Cueing Required     Comment don jacket   Lower Body Dressing     Cueing Required     Comment     Upper Body Bathing     Cueing Required     Comment     Lower Body Bathing     Cueing Required     Comment     ADL/IADL Comment         Functional Transfers    Bed Mobility From     Bed Mobility To     Level of Assist     Cueing Required     Transfer From Bed   Transfer To Chair   Level of Assist Dependent   Cueing Required     Technique     Functional Transfer Comment Hoyer lift transfer to w/c 1p. Pt wheeled to outdoor space on 6L with family. Pt instructed in UE HEP with 2lb bar and LE therex in chair. Pt tolerated 30 min outside performed therapeutic activity.  [place] for ADLs  6 Click Score: [place], [place]  05/19/2022 2:32 PM

## 2022-05-19 NOTE — Plan of Care (Deleted)
Problem: Discharge Planning - Adult  Goal: Knowledge of and participation in plan of care  05/19/2022 1408 by Ardelle Balls, RN  Outcome: Progress within 12 hours  05/19/2022 1113 by Ardelle Balls, RN  Outcome: Progress within 12 hours     Problem: Fall, at Risk or Actual - Adult  Goal: Absence of falls and fall related injury  05/19/2022 1408 by Ardelle Balls, RN  Outcome: Progress within 12 hours  05/19/2022 1113 by Ardelle Balls, RN  Outcome: Progress within 12 hours     Problem: Delirium - Adult / Pediatric  Goal: Absence or resolution of delirium  05/19/2022 1408 by Ardelle Balls, RN  Outcome: Progress within 12 hours  05/19/2022 1113 by Ardelle Balls, RN  Outcome: Progress within 12 hours     Problem: Nutrition, Alteration in -Adult  Goal: Adequate nutritional intake  05/19/2022 1408 by Ardelle Balls, RN  Outcome: Progress within 12 hours  05/19/2022 1113 by Ardelle Balls, RN  Outcome: Progress within 12 hours  Goal: Maximize nutritional intake per patient condition  05/19/2022 1408 by Ardelle Balls, RN  Outcome: Progress within 12 hours  05/19/2022 1113 by Ardelle Balls, RN  Outcome: Progress within 12 hours     Problem: Pressure Injury,At Risk and Actual- Adult  Goal: Absence of any new pressure injury  05/19/2022 1408 by Ardelle Balls, RN  Outcome: Progress within 12 hours  05/19/2022 1113 by Ardelle Balls, RN  Outcome: Progress within 12 hours  Goal: Pressure injury healing/stabilization  05/19/2022 1408 by Ardelle Balls, RN  Outcome: Progress within 12 hours  05/19/2022 1113 by Ardelle Balls, RN  Outcome: Progress within 12 hours  Goal: Participation in preventative efforts and treatment plan (Patient/Family/Caregiver)  05/19/2022 1408 by Ardelle Balls, RN  Outcome: Progress within 12 hours  05/19/2022 1113 by Ardelle Balls, RN  Outcome: Progress within 12 hours  Goal: Adequate nutritional intake  05/19/2022 1408 by Ardelle Balls, RN  Outcome: Progress within 12  hours  05/19/2022 1113 by Ardelle Balls, RN  Outcome: Progress within 12 hours     Problem: GI Elimination Impaired - Adult  Goal: Passage of soft, formed stool  05/19/2022 1408 by Ardelle Balls, RN  Outcome: Progress within 12 hours  05/19/2022 1113 by Ardelle Balls, RN  Outcome: Progress within 12 hours  Goal: Stool elimination per clinical condition ( e.g. GI ostomies )  05/19/2022 1408 by Ardelle Balls, RN  Outcome: Progress within 12 hours  05/19/2022 1113 by Ardelle Balls, RN  Outcome: Progress within 12 hours

## 2022-05-20 LAB — COMPLETE BLOOD COUNT WITH DIFFERENTIAL
Abs Basophils: 0.02 10*9/L (ref 0.00–0.10)
Abs Eosinophils: 0.06 10*9/L (ref 0.00–0.40)
Abs Imm Granulocytes: 0.09 10*9/L (ref <0.10)
Abs Lymphocytes: 0.72 10*9/L — ABNORMAL LOW (ref 1.00–3.40)
Abs Monocytes: 0.63 10*9/L (ref 0.20–0.80)
Abs Neutrophils: 3.82 10*9/L (ref 1.80–6.80)
Hematocrit: 26.5 % — ABNORMAL LOW (ref 36.0–46.0)
Hemoglobin: 8.4 g/dL — ABNORMAL LOW (ref 12.0–15.5)
MCH: 32.8 pg (ref 26.0–34.0)
MCHC: 31.7 g/dL (ref 31.0–36.0)
MCV: 104 fL — ABNORMAL HIGH (ref 80–100)
MPV: 12.3 fL (ref 9.1–12.6)
Platelet Count: 153 10*9/L (ref 140–450)
RBC Count: 2.56 10*12/L — ABNORMAL LOW (ref 4.00–5.20)
RDW-CV: 16.9 % — ABNORMAL HIGH (ref 11.7–14.4)
WBC Count: 5.3 10*9/L (ref 3.4–10.0)

## 2022-05-20 LAB — ASPARTATE TRANSAMINASE: AST: 47 U/L — ABNORMAL HIGH (ref 5–44)

## 2022-05-20 LAB — BASIC METABOLIC PANEL (NA, K, CL, CO2, BUN, CR, GLU, CA)
Anion Gap: 5 (ref 4–14)
Calcium, total, Serum / Plasma: 8 mg/dL — ABNORMAL LOW (ref 8.4–10.5)
Carbon Dioxide, Total: 28 mmol/L (ref 22–29)
Chloride, Serum / Plasma: 99 mmol/L — ABNORMAL LOW (ref 101–110)
Creatinine: 0.65 mg/dL (ref 0.55–1.02)
Glucose, non-fasting: 110 mg/dL (ref 70–199)
Potassium, Serum / Plasma: 4.5 mmol/L (ref 3.5–5.0)
Sodium, Serum / Plasma: 132 mmol/L — ABNORMAL LOW (ref 135–145)
Urea Nitrogen, Serum / Plasma: 26 mg/dL — ABNORMAL HIGH (ref 7–25)
eGFRcr: 99 mL/min/{1.73_m2} (ref >59)

## 2022-05-20 LAB — BILIRUBIN, TOTAL: Bilirubin, Total: 0.5 mg/dL (ref 0.2–1.2)

## 2022-05-20 LAB — ALANINE TRANSAMINASE: Alanine transaminase: 17 U/L (ref 10–61)

## 2022-05-20 LAB — PHOSPHORUS, SERUM / PLASMA: Phosphorus, Serum / Plasma: 3.5 mg/dL (ref 2.3–4.7)

## 2022-05-20 LAB — ALKALINE PHOSPHATASE: Alkaline Phosphatase: 183 U/L — ABNORMAL HIGH (ref 38–108)

## 2022-05-20 LAB — ALBUMIN, SERUM / PLASMA: Albumin, Serum / Plasma: 1.9 g/dL — ABNORMAL LOW (ref 3.4–4.8)

## 2022-05-20 LAB — MAGNESIUM, SERUM / PLASMA: Magnesium, Serum / Plasma: 1.9 mg/dL (ref 1.6–2.6)

## 2022-05-20 MED FILL — HYDROXYZINE HCL 25 MG TABLET: 25 mg | ORAL | Qty: 1

## 2022-05-20 MED FILL — HYDROMORPHONE 2 MG TABLET: 2 mg | ORAL | Qty: 1

## 2022-05-20 MED FILL — XIFAXAN 550 MG TABLET: 550 mg | ORAL | Qty: 1

## 2022-05-20 MED FILL — ONDANSETRON HCL 4 MG TABLET: 4 mg | ORAL | Qty: 2

## 2022-05-20 MED FILL — HYDROCODONE 5 MG-ACETAMINOPHEN 325 MG TABLET: 5-325 mg | ORAL | Qty: 2

## 2022-05-20 MED FILL — SIMETHICONE 40 MG/0.6 ML ORAL DROPS,SUSPENSION: 40 mg/0.6 mL | ORAL | Qty: 1.2

## 2022-05-20 MED FILL — ANTI-DIARRHEAL (LOPERAMIDE) 2 MG TABLET: 2 mg | ORAL | Qty: 2

## 2022-05-20 MED FILL — MIRTAZAPINE 30 MG TABLET: 30 mg | ORAL | Qty: 1

## 2022-05-20 MED FILL — THERA M PLUS (FERROUS FUMARATE) 9 MG IRON-400 MCG TABLET: 9 mg iron-400 mcg | ORAL | Qty: 1

## 2022-05-20 MED FILL — THIAMINE MONONITRATE (VITAMIN B1) 100 MG TABLET: 100 mg | ORAL | Qty: 1

## 2022-05-20 MED FILL — PREVYMIS 480 MG TABLET: 480 mg | ORAL | Qty: 1

## 2022-05-20 MED FILL — FUROSEMIDE 20 MG TABLET: 20 mg | ORAL | Qty: 3

## 2022-05-20 MED FILL — CHOLECALCIFEROL (VITAMIN D3) 25 MCG (1,000 UNIT) TABLET: 1000 UNITS | ORAL | Qty: 2

## 2022-05-20 MED FILL — LANSOPRAZOLE 3 MG/ML ORAL SUSPENSION COMPOUNDING KIT: 3 mg/mL | ORAL | Qty: 10

## 2022-05-20 MED FILL — FOLIC ACID 1 MG TABLET: 1 mg | ORAL | Qty: 1

## 2022-05-20 MED FILL — URSODIOL (BULK) 100 % POWDER: 100 % | Qty: 0.6

## 2022-05-20 MED FILL — SPIRONOLACTONE 25 MG TABLET: 25 mg | ORAL | Qty: 2

## 2022-05-20 NOTE — Consults (Signed)
OCCUPATIONAL THERAPY PROGRESS NOTE      Diagnosis and brief medical history: 24F MM, chronic diarrhea (CMV colitis vs norovirus infection), acute liver injury, admitted for persistent diarrhea and FTT    Assessment and Treatment Plan    OT Progress summary: Pt continues to be agreeable to OT despite feeling more anxious and tearful regarding needing to be cleaned for BM. Pt making good progress with bed mobility and rolling CG to min A as well as cueing. Pt making good progress with anterior weight shifting in w/c for grooming and seated ADLs. Pt requires increased time, encouragement and initially min A to attempt then progressed to CG. Pt continues to p/w global weakness, impaired balance, decreased endurance and fatigue. Pt requires max A for ADLs but demonstrating good motivation and participation. Recommend placement.  Progress with OT: Good progress  Current maximal level of assist: Maximal assist  Next session focus:      Recommendations    Recommended Discharge Disposition Placement for continued therapy   Discharge DME Recommendations Defer to next level of care   Equipment Recommendations     DME Comment     Discharge Recommendations Comment     Rehab Potential Patient participates well in therapy and is progressing towards goal   Anticipated Assistance Available at Discharge Family;Spouse/Significant other   Anticipated Type of Assistance Available at Discharge Assist as needed   Anticipated Time of Assistance Available at Discharge Unknown at this time   Barriers to Discharge Medical issues   Patient's current functional ability appropriate for D/C recommendations Yes     Inpatient Recommendations  OT Inpatient Recommendations     Recommendation Comments Hoyer lift to high back chair or wheelchair   Current Maximal Level of Assist Needed Maximal assist     Brace/Precautions   Brace/Orthotic/Prosthetic:   Precautions:Has weight bearing limitation or precaution: Yes  Precautions and weight bearing status  comments: Falls, delirium, skin, aspiration, contact     Subjective Report:"Why do bad things keep happening to me?"    Patient/Family Goal:     Objective Findings and Interventions    Areas of Occupation    Grooming and [place];Set up   [place] to engage in anterior weight shift for reach of grooming items with initially min A progressed to CG. Brushed teeth, washed face no setup required, min cueing.   Self Feeding     Cueing Required     Comment     Toileting Minimal assistance   Cueing Required     Toileting Clothing Management Maximal assistance   Cueing Required     Comment min A  rolling and max A pericare   Upper Body Dressing     Cueing Required     Comment     Lower Body Dressing Maximal assistance   Cueing Required     Comment don brief and sling   Upper Body Bathing     Cueing Required     Comment     Lower Body Bathing     Cueing Required     Comment     ADL/IADL Comment         Functional Transfers    Bed Mobility From Supine;Sidelying   Bed Mobility To Sidelying;Supine   Level of Assist Minimal assistance   Cueing Required     Transfer From Bed   Transfer To Wheelchair   Level of Assist Dependent   Cueing Required  Technique     Functional Transfer Comment Hoyer lift tranfer to w/c, pt tearful this am requesting to defer EOB balance for ADls. Pt tolerated transfer well and max A to wheel to sink. Pt engaged in grooming with min cueing. Pt very anxious this morning and tearful requiring increased time for listening and redirection.     Functional Balance for ADLs  Position Static Dynamic   Sit Static sitting level of assist: Supervision-   Static sitting comment: wheelchair  -        Stand  -      -            [place] for ADLs  6 Click Score: [address] OT  05/20/2022 10:58 AM

## 2022-05-20 NOTE — Plan of Care (Signed)
Problem: Discharge Planning - Adult  Goal: Knowledge of and participation in plan of care  Outcome: Progress within 48 hours     Problem: Fall, at Risk or Actual - Adult  Goal: Absence of falls and fall related injury  Outcome: Progress within 12 hours     Problem: Delirium - Adult / Pediatric  Goal: Absence or resolution of delirium  Outcome: Progress within 12 hours     Problem: Nutrition, Alteration in -Adult  Goal: Adequate nutritional intake  Outcome: Progress within 12 hours  Goal: Maximize nutritional intake per patient condition  Outcome: Progress within 12 hours     Problem: Pressure Injury,At Risk and Actual- Adult  Goal: Absence of any new pressure injury  Outcome: Progress within 12 hours  Goal: Pressure injury healing/stabilization  Outcome: Progress within 12 hours  Goal: Participation in preventative efforts and treatment plan (Patient/Family/Caregiver)  Outcome: Progress within 12 hours  Goal: Adequate nutritional intake  Outcome: Progress within 12 hours     Problem: GI Elimination Impaired - Adult  Goal: Passage of soft, formed stool  Outcome: Progress within 12 hours  Goal: Stool elimination per clinical condition ( e.g. GI ostomies )  Outcome: Progress within 12 hours

## 2022-05-20 NOTE — Consults (Signed)
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    ASSESSMENT  Patient required increased motivation to participate today, appeared more anxious and reporting "personal issues," difficult to redirect to functional and therapeutic activities. Patient was sitting up in chair with waffle cushion for 1 hour prior to PT arrival, reporting increased sacral wound pain. Edu patient on seated offloading strategies with BUE assist. Provided numerous options to progress OOB mobility including w/c propulsion, attempt to stand, however patient becomes tearful and refuses. She required total A mechanical lift back to bed. Patient will benefit from ongoing PT intervention to progress towards maximizing patient's overall functional mobility.    Focus next session: activity tolerance, EOB tolerance, combi    RECOMMENDATIONS  DISCHARGE RECOMMENDATION  Comments Placement with ongoing PT needs      Patient Current Functional Status Sufficient for PT Discharge Recommendation Yes   Discharge DME needs TBD   Discharge transportation needs [facility] Potential Patient participates well in therapy and progressing towards goals     NURSING RECOMMENDATIONS  Inpatient Rehab Assistive Device Recommendation Vertical dependent lift;Lateral transfer device   Activity Recommendation OOB to chair daily via sling lift     SUBJECTIVE  Subjective report:  Max encouragement to participate today. Reports feeling anxious and dealing with some "personal issues." Endorses sacral wound pain.  Notable observations:  Returned lying in bed with all needs in reach. RN informed    SYSTEMS REVIEW  Cognition/Communication  Delirium screen:  Yes Delirium Screen:   Details:  at risk given prolonged hospitalizations and decreased mob    Communication  Cognition/communication impaired:  Yes        Behavior:  Anxious    Cardiopulmonary  Cardiopulmonary deficits:  Yes  Detail:  decreased activity tolerance    Musculoskeletal  Musculoskeletal deficits:  Yes  Abnormal strength findings: B LE grossly 3-/5    Pain     Currently in pain: Yes  Pain location: sacral and periarea pain  Pain scale: Wong-Baker     Pain Level Upon Arrival: Hurts a little bit  Highest Pain Level During Therapy: Hurts little more  Pain Level End of Therapy: Hurts a little bit    COMPREHENSIVE MOVEMENT ANALYSIS/TREATMENT  Precautions/WB status: Yes  Precautions and weight bearing status comments: Falls, delirium, skin, aspiration, contact      Hemodynamic response:   Normal hemodynamic response: Yes  Comments: VSS on RA      Functional Mobility  Requires second person/additional health care providers: Yes  Type additional health care provider: Rehab aide    Bed mobility Current Initial    Rolling  Level of assist  Contact guard assist  Minimal assist (05/11/22 1331)   Device  Bed rail;Log roll  Bed rail (05/11/22 1331)   Intervention  Verbal cues;Tactile cues;External cues  Verbal cues (05/11/22 1331)   Rolling comments:     Transfer Current Initial   Sit < > Stand  Level of assist     Device     Intervention     Transfer  Level of assist  From: Wheelchair  To: Bed  Dependent assist;Two person assist  Dependent assist;Two person assist (04/29/22 1209)   Device  Vertical dependent lift  Vertical dependent lift (04/29/22 1209)   Intervention     Sit < > Stand comments:   Transfer comments:      Balance  Balance deficits noted: Yes  Functional Balance for ADLs  Position Static Dynamic   Sit Static sitting level of assist: Supervision-   Static sitting comment: wheelchair  -        Stand  -      -        Exercise/other interventions  Sitting exercises: Long arc quad;Ankle pumps  Sitting exercises comment (other exercise, details): BLE AROM, glute squeezes, forward trunk leans, weight shifting, and tricep dips for offloading painful sacral  region    Communication between other health care providers: Communication between other health care provider: RN  Communication comment: pt's functional status     Education assessment  Learner: Patient  Content: Plan of care, Activity recommendations, Discharge recommendations  Response: Needs reinforcement, Verbalizes understanding, Demonstrates understanding  Outcome measures   Physical Therapist Global Assessment of Mobility  Activity Achieved: Passive transfer to bed  AMPAC 6-clicks basic mobility score: 10      PLAN  Plan of care status:  Current plan of care remains appropriate  PT frequency:  5x/week  PT duration:  4 weeks.      Planned PT interventions:   Specific interventions: Progressive functional mobility training;Balance training;NM Re-ed;Aerobic training;Ther ex  Education interventions: Fall risk reduction;Exercise program;Self-pacing/breathing;Benefits of activity;Caregiver training        Lorella Nimrod, PT    05/20/2022

## 2022-05-20 NOTE — Interdisciplinary (Addendum)
CASE MANAGEMENT FOLLOW UP NOTE     Clinical Status  33F MM w/ chronic diarrhea (CMV colitis vs protracted norovirus infection - now cleared), acute liver injury, admitted for workup of persistent diarrhea (non-infectious, now at baseline 1-2/day) and FTT       Functional Status       Financial Status  Payor:  [employer]  Second Payor:      EDD         CM sw Traci,[phone] admission coordinator from  [place] [facility], [phone] aka: [facility] Phone: [phone]     Pt [employer] insurance denied SNF stay. They provided information for:     Insurance Peer [phone] option 4      Appeal ph: [phone] Ref number: [id number]   CM sw pt daughter and gave her the appeal information.  She said she has had to appeal before for SNF, and was not successful, but pt was unable to participate in therapy at the time.  Now that she has improved and is actively participating in therapy she is more hopeful to succeed in getting the auth.      Per MDR, provider tried to arrange peer to peer today, [employer] made appointment then canceled and rescheduled 1PM tomorrow.  Covering CM to follow up on outcome.     Pam Drown  MSN, RN, CM  [place] Case Management  Hematology, Oncology      05/20/2022

## 2022-05-20 NOTE — Consults (Signed)
Hospital Medicine Procedure Service Consultation Note    My date of service is 05/20/2022.    Consulting service: CRI/BMT    Reason for consult: Management of symptomatic ascites    HPI:     89F MM w/ chronic diarrhea (CMV colitis vs protracted norovirus infection - now cleared), acute liver injury, admitted for workup of persistent diarrhea (non-infectious, now at baseline 1-2/day) and FTT       Lab Results   Component Value Date    INR 1.2 05/17/2022    PT 15.1 (H) 05/17/2022      Lab Results   Component Value Date    Platelet Count 153 05/20/2022         Risk assessment (Link to guidance):  Pending further evaluation    Assessment and plan:  Procedure team unable to perform procedure today. The procedure team will next be available to reassess on 05/21/22 .     The primary team was contacted regarding our recommendations.    Please contact the procedure service pager at [phone] with any questions or new procedure requests.    Mady Gemma, MD

## 2022-05-20 NOTE — Progress Notes (Addendum)
[facility] PROGRESS NOTE     This is an independent service.   The available consultant for this service is Marion Downer, MD.           24 Hour Course  -HDS, afebrile  -Bmx1 today, no change in loperamide  -Na trending down, no change to diuretics today  -Peer to peer w/ insurance for SNF placement authorization scheduled for 7/7  -TAPS planning paracentesis 05/21/22    Subjective  Feeling anxious this morning, but working with OT and out of bed in wheelchair. She has intermittent gas pains and pain d/t sacral pressure injury. Her abdomen is feeling tighter but she would like to defer paracentesis to tomorrow.     Vitals  Temp:  [36.8 C (98.2 F)-37.4 C (99.4 F)] 36.9 C (98.5 F)  Heart Rate:  [75-81] 80  *Resp:  [16-18] 16  BP: (86-108)/(67-79) 94/70  SpO2:  [97 %-99 %] 98 %    MostRecent Weight: 67.4 kg (148 lb 9.4 oz)  Admission Weight: 66.2 kg (146 lb)      Intake/Output Summary (Last 24 hours) at 05/20/2022 0805  Last data filed at 05/20/2022 0426  Gross per 24 hour   Intake 620 ml   Output 750 ml   Net -130 ml       Physical Exam  Gen: NAD, appropriate and engaged  HEENT: MMM, no oral lesions, sclera non-icteric  Resp: CTAB, no rhonchi/wheezing  Card: RRR  Gi: distended, firm, non-tender, +bs x4  Ext: negative BLE edema   Skin: scattered ecchymosis to BUE  Neuro: Alert, oriented x3, no focal neuro deficits  Lines: PIV x1 in place, clean dry & intact dressing    Scheduled Meds:   0.9% sodium chloride flush  3 mL Intravenous Q12H [place]    cholecalciferol (vitamin D3)  2,000 Units Per G Tube Daily [place]    cyanocobalamin (Vitamin B12)  1,000 mcg Subcutaneous Q7 Days    folic acid  1 mg Per G Tube Daily [place]    furosemide  60 mg Per G Tube Daily [place]    lansoprazole  30 mg Per G Tube Q AM Before Breakfast [facility]    letermovir  480 mg Other Daily [place]    loperamide  4 mg Feeding Tube Q8H    mirtazapine  30 mg Per G Tube Daily At Bedtime [facility]    multivitamin with iron-minerals-folic acid  1 tablet Feeding Tube  Daily SCH    ondansetron  8 mg Per G Tube TID    rifAXIMin  550 mg Feeding Tube BID SCH    simethicone  80 mg Oral 4x Daily [place]    spironolactone  150 mg Per G Tube Daily [place]    thiamine mononitrate (vit B1)  100 mg Per G Tube Daily [place]    ursodiol  600 mg Per G Tube BID [place]     Continuous Infusions:  PRN Meds:   0.9% sodium chloride flush  3 mL Intravenous PRN    acetaminophen  650 mg Per G Tube Q8H PRN    albuterol  2.5 mg Nebulization Q4H PRN    Or    albuterol  2.5 mg Nebulization Q4H PRN    aluminum-magnesium hydroxide-simethicone  15 mL Per G Tube Q4H PRN    HYDROcodone-acetaminophen  1-2 tablet Per G Tube Q6H PRN    HYDROmorphone  1 mg Oral Q4H PRN    hydrOXYzine  25 mg Feeding Tube Q4H PRN    opium  6 mg Oral 4x Daily PRN    prochlorperazine  5 mg Intravenous Q8H PRN    Or    prochlorperazine  5 mg Per G Tube Q8H PRN     Data  CBC        05/20/22  0552 05/19/22  0727   WBC 5.3 4.8   HGB 8.4* 8.3*   HCT 26.5* 26.1*   PLT 153 149     Coags  No results found in last 36 hours    Chem7        05/20/22  0552 05/19/22  0727   NA 132* 133*   K 4.5 4.2   CL 99* 101   CO2 28 27   BUN 26* 24   CREAT 0.65 0.64   GLU 110 126     Electrolytes        05/20/22  0552 05/19/22  0727   CA 8.0* 7.9*   MG 1.9 1.9   PO4 3.5 3.3     Liver Panel        05/20/22  0552 05/19/22  0727   AST 47* 44   ALT 17 17   ALKP 183* 182*   TBILI 0.5 0.5   ALB 1.9*  --      UA  No results found in last 7 days    Microscopy:  No results found in last 7 days    Microbiology Results (last 72 hours)       Procedure Component Value Units Date/Time    Cytomegalovirus DNA, Quantitative PCR, Plasma [[id number]]  (Abnormal) Collected: 05/18/22 0611    Order Status: Completed Specimen: Blood from Serum Updated: 05/19/22 1626     CMV DNA (IU/mL) 50 IU/mL      Comment: *SUBCRITICAL*  Performed by Real-Time PCR  Note new reference range.          CMV DNA (Log IU/mL) 1.70     Comment: Log10 IU/mL  Note new reference range.         Bacterial Culture, Normally  Sterile Sites, With Gram Stain [[id number]] Collected: 05/11/22 1115    Order Status: Completed Specimen: Not Applicable from Peritoneal Fluid Updated: 05/19/22 1239     Gram Stain Rare Host cells (not PMNs, RBCs, or squamous epithelial cells) , No PMNs seen      No organisms seen     Bacterial Culture, Sterile Sites No growth 8 days.          Radiology Results  No results found.    I discussed the patient with Marion Downer, MD from [facility] regarding A&P.    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  2011: VRd bortezomib, Lenalidomide, Dexamethasone  04/2011: Auto SCT  07/2014: Rd - revlimid 15 mg every other day, dexamethasone 8 mg PO weekly.   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   Receiving dexamethasone 12 mg weekly for diarrhea as outpt  10/16/21 - last Daratumumab dose (on hold due to diarrhea)     DATA:   09/07/2010: IgG Kappa MM  12/02/2021: Ig M <5,  IgG 447, IgA 20, Ig E <2, SFLC Kappa 16.6, Lambda 1.6, Kappa/Lamda 10.38  12/14/21: IgG K on IFE, IgG 430, KLC 16.6, LLC 2.7, KLR 6.15  02/23/22 SPEP with stable M-spike, SFLC decreased Kappa 44.2 / Lambda 5.5 = K/L ratio = 8.04 (overall downtrending still)  04/23/22 M-spike 0.6, IgG K on IFE, IgG 997, Kappa 70.7/Lambda 19.8 = K/L ratio = 3.57  Monitor MM labs monthly per Dr Jacelyn Grip, next due 7/9     Chemo:  TBD, therapy held since 10/2021 due to recurrent infections and now deconditioning  Not currently a candidate due to significant deconditioning, but functional status improving and may become a candidate in the future with ongoing rehab.     Supportive Care:   Transfuse  for Hgb <7 and Platelets <10  Access: PIV     Primary Oncologist:   Dr. Jacelyn Grip ([place]), will transfer to Dr. Hassell Done ()  Dr. Iona Coach ([place])     Disposition:  Lives in [place] - anticipate discharge to [place] denied by [facility] on 7/6, peer to peer scheduled for 7/7 at 1300  '[ ]'$  will need paracentesis q7-10 days coordinated outpatient with IR and transportation arranged with family      # Immunocompromised  # Diarrhea - controlled  # c/b CMV Viremia  # Hx prolonged norovirus infection  Immunocompromised due to underlying  malignancy, not currently neutropenic. Recent extended hospitalization at both [place] 1/23-04/16/22, with hypovolemic shock associated with norovirus, CMV colitis, acute liver failure associated with pomylast (see ALF section).  Currently admitted for ongoing diarrhea and FTT in brief time at home after dc from [facility], initially began in 8/22, recently managed at home w/ loperamide BID w/ 2-3 watery stools daily. Diarrhea worsened on 6/12 (11 stools). Possibly r/t to inadequate dosing of loperamide vs infectious etiology. GI c/s 6/13 for possible scope - per patient and family declined endoscopy at this time. ID consultation deferred as infectious w/u negative.   Given CMV improving, norovirus negative, and enteric panel unrevealing - favor non-infectious etiologies for diarrhea. ID has requested we maintain enteric precautions for hospitalization d/t prolonged prior GI infections.   Diarrhea ~4 stools/day as of 7/1, improved to 1 soft/formed stool/d on 7/6.    Data  - Norovirus  - First +norovirus 08/2021 with persistent +norovirus s/p 7-day courses x 2 nitazoxanide (2/16-02/22/23 & 01/2022). Continued to be norovirus positive (most recently on 04/07/22). Norovirus, cdiff, GI PCR neg this admission (6/9).   - CMV  -Regarding CMV colitis, 12/17/21 flex sig showed CMV colitis, sp multiple cycles of gancyclovir IV, IVIG, valcyte 900 mg PO BID, then ultimately  transitioned to letermovir, which she continues. 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 Acute rise in 11/2021,coinciding with acute CMV viremia but liver biopsy CMV stains negative (12/11/21). 12/14/21: Hepatitis/viral work-up negative in 11/2021. LP was done on 12/16/21 for hepatic encephalopathy w/ CMV CSF PCR negative. Suspected to be 2/2 pomalidamide, though liver path from 12/22/21 unlikely for vanishing bile duct syndrome.   Upon admission was more somnolent and one episode of possible visual hallucination reported on 6/23. Her mentation has improved throughout this hospitalization. A&Ox3 as of 7/6.   C/s to Hepatology (7/2) regarding optimizing medical management and improving ascites: ascites felt to be primarily 2/2 malnutrition (see below) and some element of portal HTN. Goal will be to reduce paracentesis need to 1x/month by optimizing diuresis and improving nutritional status. Appreciate recs. Serum Na trending down as of 7/6 (132), asymptomatic.     Dx:  - 6/17 KUB: mild ileus and density over abdomen (likely ascitic fluid) - not clinically significant as patient is having regular bowel movements and no PVR noted.  - 6/20 therapeutic paracentesis by TAPS, -4L + albumin  - 6/23 ammonia  normal  - 6/27 paracentesis by TAPS -5L + albumin, SBP unrevealing or infection. cytology negative for malignancy  - 7/2 RUQ u/s w/ doppler: Mild liver surface nodularity c/w early cirrhosis and/or fibrosis. Patent vasculature with normal direction of flow. Sequela of portal hypertension including increased now moderate ascites and splenomegaly. Cholelithiasis.  - 7/7 planned paracentesis by TAPS '[ ]'$      Plan:  -Appreciate hepatology recs:  -Continue spironolactone 150 mg + furosemide 60 mg to minimize accumulation of ascites   (started 6/16, increased 6/19-, last incr 7/3- )  -Hold for SBP<90 or MAP<60  -Monitor I/Os & serum Na  -goal of optimizing so that paracentesis requirement is improved to 1x/month  -Currently requiring paracentesis q7-10d - will need to be arranged for d/c   -Follow up Alpha 1 antitrypsin stool study sent 7/4 '[ ]'$  - rule out malabsorption disorder   -Paracentesis planned for 7/7 by TAPS w/ cell count, diff, culture  -Continue Ursodiol 600 mg PO BID  -Continue rifaximin 550 mg PO BID  -Low sodium diet - challenging in context of tube feeds  -2L fluid restriction  -Delirium precautions  -[place] surveillance next in 11/2022     # Failure to thrive  # PEG tube dependence  # Macrocytic anemia - B12 deficiency  PEG tube placed during last admission. Low B12 level but MMA normal, which is not consistent with B12 deficiency, however still treating at this time as below. Encouraged PO intake in addition to tube feeds. Transitioned to cyclic tube feeds on 5/09.  6/29 - tolerating cyclic TF and improving oral intake  7/3 - tolerating more oral intake, including solids  7/6 - continued improvement in oral intake, including ~50% of lunch on 7/5    Plan:  -Continue cyclic TF - Nutren 1.5 @ 75 mL x 16 hours (1800-1000) - TID residuals per RN protocol   -Discussed TF formula w/ RD on 7/5, pending further discussion among pt/family, no change at present  -Continue Thera-M Plus MVI for copper deficiency (Copper =  47 on 6/16)  -Continue mirtazapine '30mg'$  nightly (incr 6/18- ) per palliative recs  -Continue cyanocobalamin 1078mg SQ weekly (6/24- )   -s/p Cyanocobalamin 10024m SQ daily x7 days (6/17-6/23)  -s/p Zinc acetate 6/11-6/23)    # Dysphagia  Dysphagia and aspiration risk assessed by speech language pathology. SLP recommended Modified Barium Swallow (completed on 6/21)  which showed moderate pharyngeal aspiration; SLP recommends regular diet w/ mildly thickened liquids.   6/22 - Pt and daughter request to thicken liquids themselves at bedside for more variety, understand risk of aspiration w/ thins. Liberalized diet per their request to regular.   Appreciate SLP recs.   - See failure to thrive section as well re: PEG tube and nutritional deficiencies     # Pain  # Pressure Injury (sacrum)  Pain primarily r/t pressure injuries though occasionally also d/t abdominal cramping. Wound healing likely limited d/t poor nutritional status (see above) and limited mobility (improving).  -Consulted Palliative Care 6/17 for SMS, now s/o  -Continue hydrocodone-acetaminophen PRN  -Continue hydromorphone per G tube PRN     # Physical Deconditioning  Pt motivated to participate w/ PT/OT, deconditioned after prior extended hospitalization (as above). Showing marked improvements in anterioir weight shifting in WC, endurance w/ static siting balance & bed mobility. Still limited by global weakness, impaired balance, decreased endurance and fatigue.  - Continue PT/OT   - SNF placement recommended    # GOC  Pt is consistent and clear that she wishes to pursue full medical management of her ongoing medical problems, as above, with the goal of prolonging her life. She is motivated by her desire to spend time with her family. This is consistent in conversations with [facility] (6/18) & Dr. Jacelyn Grip (6/21).     VTE PPx:  Contraindicated: Anticipated procedure    Severity of Illness  Stable    Nutritional Assessment  Severe Chronic disease or condition  related malnutrition    During this hospitalization the patient is also being treated for:  - Anemia associated with malignancy (present on admission)    - Thrombocytopenia (present on admission)    - Coagulopathy (present on admission)    - Malignancy associated fatigue on admission  - Neoplasm/malignancy associated pain  - Hypoalbuminemia (present on admission)  - HYPOnatremia (present on admission)  - Fluid status: Volume overload on admission  - Cachexia  - Acute liver failure, present on admission    Code Status: FULL          I spent a total time of 80 minutes in this episode of care preparing to see the patient, obtaining and/or reviewing separately obtained history, performing a medically appropriate examination and/or evaluation, counseling and educating patient/family/caregiver, ordering and reviewing medications, tests, or procedures, referring and communicating with other health care professionals, documenting clinical information in the electronic or other health record, and communicating results to the patient/family/caregiver, and/or other care coordination.    Evangeline Gula, NP  Hematology-Oncology BMT/CT  Pager: [phone]  05/20/22  1:58 PM

## 2022-05-21 LAB — ALKALINE PHOSPHATASE: Alkaline Phosphatase: 195 U/L — ABNORMAL HIGH (ref 38–108)

## 2022-05-21 LAB — BASIC METABOLIC PANEL (NA, K, CL, CO2, BUN, CR, GLU, CA)
Anion Gap: 5 (ref 4–14)
Calcium, total, Serum / Plasma: 8 mg/dL — ABNORMAL LOW (ref 8.4–10.5)
Carbon Dioxide, Total: 27 mmol/L (ref 22–29)
Chloride, Serum / Plasma: 98 mmol/L — ABNORMAL LOW (ref 101–110)
Creatinine: 0.7 mg/dL (ref 0.55–1.02)
Glucose, non-fasting: 96 mg/dL (ref 70–199)
Potassium, Serum / Plasma: 4.5 mmol/L (ref 3.5–5.0)
Sodium, Serum / Plasma: 130 mmol/L — ABNORMAL LOW (ref 135–145)
Urea Nitrogen, Serum / Plasma: 25 mg/dL (ref 7–25)
eGFRcr: 98 mL/min/{1.73_m2} (ref >59)

## 2022-05-21 LAB — ALANINE TRANSAMINASE: Alanine transaminase: 18 U/L (ref 10–61)

## 2022-05-21 LAB — COMPLETE BLOOD COUNT WITH DIFFERENTIAL
Abs Basophils: 0.02 10*9/L (ref 0.00–0.10)
Abs Eosinophils: 0.05 10*9/L (ref 0.00–0.40)
Abs Imm Granulocytes: 0.1 10*9/L — ABNORMAL HIGH (ref <0.10)
Abs Lymphocytes: 0.73 10*9/L — ABNORMAL LOW (ref 1.00–3.40)
Abs Monocytes: 0.8 10*9/L (ref 0.20–0.80)
Abs Neutrophils: 4.47 10*9/L (ref 1.80–6.80)
Hematocrit: 25.5 % — ABNORMAL LOW (ref 36.0–46.0)
Hemoglobin: 8.2 g/dL — ABNORMAL LOW (ref 12.0–15.5)
MCH: 32.8 pg (ref 26.0–34.0)
MCHC: 32.2 g/dL (ref 31.0–36.0)
MCV: 102 fL — ABNORMAL HIGH (ref 80–100)
MPV: 12.9 fL — ABNORMAL HIGH (ref 9.1–12.6)
Platelet Count: 145 10*9/L (ref 140–450)
RBC Count: 2.5 10*12/L — ABNORMAL LOW (ref 4.00–5.20)
RDW-CV: 16.8 % — ABNORMAL HIGH (ref 11.7–14.4)
WBC Count: 6.2 10*9/L (ref 3.4–10.0)

## 2022-05-21 LAB — ASPARTATE TRANSAMINASE: AST: 47 U/L — ABNORMAL HIGH (ref 5–44)

## 2022-05-21 LAB — PHOSPHORUS, SERUM / PLASMA: Phosphorus, Serum / Plasma: 3.2 mg/dL (ref 2.3–4.7)

## 2022-05-21 LAB — ALPHA-1-ANTITRYPSIN, RANDOM STOOL: Alpha-1-Antitrypsin, Random Stool: <16 mg/dL (ref <55)

## 2022-05-21 LAB — BILIRUBIN, TOTAL: Bilirubin, Total: 0.5 mg/dL (ref 0.2–1.2)

## 2022-05-21 LAB — MAGNESIUM, SERUM / PLASMA: Magnesium, Serum / Plasma: 1.9 mg/dL (ref 1.6–2.6)

## 2022-05-21 MED ORDER — GUAIFENESIN 100 MG/5 ML ORAL LIQUID
100 mg/5 mL | ORAL | Status: DC | PRN
  Administered 2022-05-22: 06:00:00 via ORAL

## 2022-05-21 MED ORDER — SIMETHICONE 40 MG/0.6 ML ORAL DROPS,SUSPENSION
40 | Freq: Four times a day (QID) | ORAL | Status: DC
Start: 2022-05-21 — End: 2022-05-27
  Administered 2022-05-21 – 2022-05-23 (×6): 160 mg via ORAL
  Administered 2022-05-23: 22:00:00 via ORAL
  Administered 2022-05-23: 05:00:00 160 mg via ORAL
  Administered 2022-05-24 (×2): via ORAL
  Administered 2022-05-24 – 2022-05-28 (×14): 160 mg via ORAL

## 2022-05-21 MED ORDER — SIMETHICONE 40 MG/0.6 ML ORAL DROPS,SUSPENSION
40 | Freq: Four times a day (QID) | ORAL | 0.00 refills | 10.50000 days | Status: DC
Start: 2022-05-21 — End: 2023-03-01

## 2022-05-21 MED ORDER — LIDOCAINE (PF) 10 MG/ML (1 %) INJECTION SOLUTION
10 | Freq: Once | INTRAMUSCULAR | Status: AC
Start: 2022-05-21 — End: 2022-05-21
  Administered 2022-05-21: 19:00:00 10 mL via SUBCUTANEOUS

## 2022-05-21 MED FILL — HYDROCODONE 5 MG-ACETAMINOPHEN 325 MG TABLET: 5-325 mg | ORAL | Qty: 2

## 2022-05-21 MED FILL — ONDANSETRON HCL 4 MG TABLET: 4 mg | ORAL | Qty: 2

## 2022-05-21 MED FILL — FOLIC ACID 1 MG TABLET: 1 mg | ORAL | Qty: 1

## 2022-05-21 MED FILL — HYDROCODONE 5 MG-ACETAMINOPHEN 325 MG TABLET: 5-325 mg | ORAL | Qty: 1

## 2022-05-21 MED FILL — SIMETHICONE 40 MG/0.6 ML ORAL DROPS,SUSPENSION: 40 mg/0.6 mL | ORAL | Qty: 1.2

## 2022-05-21 MED FILL — ANTI-DIARRHEAL (LOPERAMIDE) 2 MG TABLET: 2 mg | ORAL | Qty: 2

## 2022-05-21 MED FILL — URSODIOL (BULK) 100 % POWDER: 100 % | Qty: 0.6

## 2022-05-21 MED FILL — XIFAXAN 550 MG TABLET: 550 mg | ORAL | Qty: 1

## 2022-05-21 MED FILL — FUROSEMIDE 20 MG TABLET: 20 mg | ORAL | Qty: 3

## 2022-05-21 MED FILL — HYDROXYZINE HCL 25 MG TABLET: 25 mg | ORAL | Qty: 1

## 2022-05-21 MED FILL — THIAMINE MONONITRATE (VITAMIN B1) 100 MG TABLET: 100 mg | ORAL | Qty: 1

## 2022-05-21 MED FILL — SPIRONOLACTONE 25 MG TABLET: 25 mg | ORAL | Qty: 2

## 2022-05-21 MED FILL — THERA M PLUS (FERROUS FUMARATE) 9 MG IRON-400 MCG TABLET: 9 mg iron-400 mcg | ORAL | Qty: 1

## 2022-05-21 MED FILL — CHOLECALCIFEROL (VITAMIN D3) 25 MCG (1,000 UNIT) TABLET: 1000 UNITS | ORAL | Qty: 2

## 2022-05-21 MED FILL — SIMETHICONE 40 MG/0.6 ML ORAL DROPS,SUSPENSION: 40 mg/0.6 mL | ORAL | Qty: 2.4

## 2022-05-21 MED FILL — LANSOPRAZOLE 3 MG/ML ORAL SUSPENSION COMPOUNDING KIT: 3 mg/mL | ORAL | Qty: 10

## 2022-05-21 MED FILL — MIRTAZAPINE 30 MG TABLET: 30 mg | ORAL | Qty: 1

## 2022-05-21 MED FILL — PREVYMIS 480 MG TABLET: 480 mg | ORAL | Qty: 1

## 2022-05-21 NOTE — Interdisciplinary (Addendum)
CASE MANAGEMENT FOLLOW UP NOTE     Clinical Status  66F MM w/ chronic diarrhea (CMV colitis vs protracted norovirus infection - now cleared), acute liver injury, admitted for workup of persistent diarrhea (non-infectious, now at baseline 1-2/day) and FTT       Functional Status  Mobility/Safety  Number of Person to Assist: 2 persons  Assistive Device Used: Lateral transfer device    Financial Status  Payor:  AETNA  Second Payor:      EDD  05/24/22          Additional Assessment/Plan Details    1500-  Per MDR, patient appeal for SNF has been denied as requested by family.  Per APP Gardiner Rhyme at [facility], Peer to Peer with MD at [facility] ( Dr. Alfonse Ras )was approved initially but due to coinciding appeal filed by family which resulted in denial, approved appeal per MD has been denied until families denial can be appealed. Per Dr. Alfonse Ras, additional appeal with family needs to be refilled for consideration. Case number [id number]. CM reached out to Dtr [name] [phone]) and informed of need to complete second appeal. CM provided update to APP Gardiner Rhyme.      Gerhard Perches RN float case manager   Phone ext [place]  05/21/2022

## 2022-05-21 NOTE — Procedures (Signed)
PROCEDURE NOTE    Patient Name: Ariel Braun  [id number]   August 27, 1961                          Diagnoses:  1. Multiple myeloma and immunoproliferative neoplasms (CMS code)    2. Abdominal distension    3. Norovirus    4. Other ascites    5. Pain        Paracentesis    Date/Time: 05/21/2022 11:47 AM  Performed by: Darlin Drop, MD  Authorized by: Marion Downer, MD     Consent Process:     Emergent situation preventing consent process:  No    Discussion with patient included the following (comment on exceptions):  Diagnosis (the reason for which the procedure is being proposed) and proposed treatment or procedure, Risks, benefits, side effects, likelihood of success of the proposed treatment, anticipated recuperation, and the alternative options and Patient's questions related to the procedure were addressed  Universal Protocol - Time Out Checklist:     Patient ID verified:  Yes    Surgical/procedural site and side verified:  Yes    Site marking verified:  Yes  Pre-procedure details:     Procedure purpose:  Diagnostic and therapeutic    Skin preparation:  Chloraprep    Preparation: Patient was prepped and draped in sterile fashion    Pre-procedure medications (see MAR for exact dosages):     Procedural sedation: patient not sedated    Anesthetic used:  Local infiltration    Amount of local anesthetic injected (ml): 8  Procedure details:     Ultrasound guidance: yes      Puncture site:  R lower quadrant    Needle gauge:  18    Catheter size:  8 Fr    Number of attempts:  1      Fluid amount removed (ml): 3800    Fluid appearance:  Serous    Fluid sent for: cell count/diff, bacterial culture and gram stain    Dressing: gauze and adhesive bandage  Post-procedure details:     25% albumin IV repletion indicated: no 25% albumin IV repletion indicated    Patient tolerance of procedure:  Tolerated well, no immediate complications  Comments:      RLQ dx/tx para with 3.8L clear/light serous ascites sent for cell ct +  gs/cx, no IV albumin repletion indicated. Pt tolerated well without immediate complications.    Darlin Drop, MD  05/21/2022

## 2022-05-21 NOTE — Plan of Care (Signed)
Problem: Discharge Planning - Adult  Goal: Knowledge of and participation in plan of care  Outcome: Progress within 48 hours     Problem: Fall, at Risk or Actual - Adult  Goal: Absence of falls and fall related injury  Outcome: Progress within 12 hours     Problem: Delirium - Adult / Pediatric  Goal: Absence or resolution of delirium  Outcome: Progress within 12 hours     Problem: Nutrition, Alteration in -Adult  Goal: Adequate nutritional intake  Outcome: Progress within 12 hours  Goal: Maximize nutritional intake per patient condition  Outcome: Progress within 12 hours     Problem: Pressure Injury,At Risk and Actual- Adult  Goal: Absence of any new pressure injury  Outcome: Progress within 12 hours  Goal: Pressure injury healing/stabilization  Outcome: Progress within 12 hours  Goal: Participation in preventative efforts and treatment plan (Patient/Family/Caregiver)  Outcome: Progress within 12 hours  Goal: Adequate nutritional intake  Outcome: Progress within 12 hours     Problem: GI Elimination Impaired - Adult  Goal: Passage of soft, formed stool  Outcome: Progress within 12 hours  Goal: Stool elimination per clinical condition ( e.g. GI ostomies )  Outcome: Progress within 12 hours

## 2022-05-21 NOTE — Consults (Signed)
PHYSICAL THERAPY TREATMENT NOTE    PT relevant HPI  32F MM, chronic diarrhea (CMV colitis vs norovirus infection), acute liver injury, admitted for persistent diarrhea and FTT.    Medical Update:   paracentesis this AM    ASSESSMENT  Patient continues to be limited by anxiousness and fearful of mobility, redirectable. Patient participates in bed mobility supine>sit requiring mod A. Patient demonstrates improved sitting static balance, requiring CGA. Patient required max A x 2 for sit<>stand in STEDY x 3 bouts, max tactile cues for hip/trunk extension. Able to transfer to chair with STEDY this session. She will benefit from ongoing PT intervention to maximize patient's overall functional mobility.    Focus next session: sit<>stand training    RECOMMENDATIONS  DISCHARGE RECOMMENDATION  Comments Placement with ongoing PT needs      Patient Current Functional Status Sufficient for PT Discharge Recommendation Yes   Discharge DME needs TBD   Discharge transportation needs [facility] Patient participates well in therapy and progressing towards goals     NURSING RECOMMENDATIONS  Inpatient Rehab Assistive Device Recommendation Vertical dependent lift;Lateral transfer device   Activity Recommendation OOB to chair daily via sling lift     SUBJECTIVE  Subjective report:  Agreeable with encouragement. Reports feeling anxious and fearful  Notable observations:  Left sitting upright in wheelchair. All needs in reach. Family at bedside    SYSTEMS REVIEW  Cognition/Communication  Delirium screen:  Yes Delirium Screen:   Details:  at risk given prolonged hospitalizations and decreased mob    Communication  Cognition/communication impaired:  Yes        Behavior: Anxious    Integumentary  Integumentary deficits:  YesIntegumentary deficit detail:  PEG    Cardiopulmonary  Cardiopulmonary deficits:  Yes  Detail:  decreased activity tolerance    Musculoskeletal  Musculoskeletal deficits:  Yes  Abnormal strength findings: B LE  grossly 3-/5      Pain     Pain location: sacral and periarea pain     Pain Level Upon Arrival: Hurts a little bit  Highest Pain Level During Therapy: Hurts even more  Pain Level End of Therapy: Hurts a little bit    COMPREHENSIVE MOVEMENT ANALYSIS/TREATMENT  Precautions/WB status: Yes  Precautions and weight bearing status comments: Falls, delirium, skin, aspiration, contact      Hemodynamic response:   Normal hemodynamic response: Yes  Comments: VSS on RA      Functional Mobility  Requires second person/additional health care providers: Yes  Type additional health care provider: OT    Bed mobility Current Initial    Supine < > Sit  Level of assist  Moderate assist  Dependent assist;Two person assist (04/28/22 1132)   Device  Bed rail;Increased time;Log roll  Bed rail;Increased time;Log roll (04/28/22 1132)   Intervention  Verbal cues;Tactile cues  Verbal cues;Tactile cues (04/28/22 1132)   Supine<>sit comments:   cues for whole body sequencing    Transfer Current Initial   Sit < > Stand  Level of assist  Maximal assist;Two person assist  Maximal assist;Two person assist (05/21/22 1621)   Device  STEDY  STEDY (05/21/22 1621)   Intervention  Verbal cues;Tactile cues;Sequencing;Safe technique  Verbal cues;Tactile cues;Sequencing;Safe technique (05/21/22 1621)   Transfer  Level of assist  From: Bed  To: Wheelchair  Dependent assist;Two person assist  Dependent assist;Two person assist (04/29/22 1209)   Device  STEDY  Vertical dependent lift (04/29/22 1209)   Intervention     Sit < >  Stand comments:  cues for hip/trunk extension, towel under B heels d/t impaired B DF ROM/calf tightness  Transfer comments:      Balance  Balance deficits noted: Yes  Functional Balance for ADLs  Position Static Dynamic   Sit Static sitting level of assist: Contact guard assist-   Static sitting comment: EOB Dynamic sitting level of assist: Moderate assist-  Dynamic sitting comment: scooting forward     Stand  -      -           Communication between other health care providers: Communication between other health care provider: RN  Communication comment: pt's functional status     Education assessment  Learner: Patient  Content: Plan of care, Activity recommendations, Discharge recommendations  Response: Needs reinforcement, Verbalizes understanding, Demonstrates understanding  Outcome measures   Physical Therapist Global Assessment of Mobility  Activity Achieved: Passive transfer to chair/commode  AMPAC 6-clicks basic mobility score: 10      PLAN  Plan of care status:  Current plan of care remains appropriate  PT frequency:  5x/week  PT duration:  4 weeks.      Planned PT interventions:   Specific interventions: Progressive functional mobility training;Balance training;NM Re-ed;Aerobic training;Ther ex  Education interventions: Fall risk reduction;Exercise program;Self-pacing/breathing;Benefits of activity;Caregiver training      Lorella Nimrod, PT    05/21/2022

## 2022-05-21 NOTE — Plan of Care (Signed)
Problem: Discharge Planning - Adult  Goal: Knowledge of and participation in plan of care  Outcome: Progress within 12 hours     Problem: Fall, at Risk or Actual - Adult  Goal: Absence of falls and fall related injury  Outcome: Progress within 12 hours     Problem: Delirium - Adult / Pediatric  Goal: Absence or resolution of delirium  Outcome: Progress within 12 hours     Problem: Nutrition, Alteration in -Adult  Goal: Adequate nutritional intake  Outcome: Progress within 12 hours  Goal: Maximize nutritional intake per patient condition  Outcome: Progress within 12 hours     Problem: Pressure Injury,At Risk and Actual- Adult  Goal: Absence of any new pressure injury  Outcome: Progress within 12 hours  Goal: Pressure injury healing/stabilization  Outcome: Progress within 12 hours  Goal: Participation in preventative efforts and treatment plan (Patient/Family/Caregiver)  Outcome: Progress within 12 hours  Goal: Adequate nutritional intake  Outcome: Progress within 12 hours     Problem: GI Elimination Impaired - Adult  Goal: Passage of soft, formed stool  Outcome: Progress within 12 hours  Goal: Stool elimination per clinical condition ( e.g. GI ostomies )  Outcome: Progress within 12 hours

## 2022-05-21 NOTE — Consults (Signed)
OCCUPATIONAL THERAPY PROGRESS NOTE      Diagnosis and brief medical history: 16F MM, chronic diarrhea (CMV colitis vs norovirus infection), acute liver injury, admitted for persistent diarrhea and FTT    Assessment and Treatment Plan    OT Progress summary: Pt making good progress and continues to be agreeable to therapy despite having anxiety today. Pt demosntrated improved sitting balance EOB with CG and improved bed mobility requiring mod A. Pt continues to be limited by impaired LE strength requiring 2p max A to stand in stedy. Pt demosntrated improved tolerance, able to stand 3x with stedy. Pt transferred to w/c first time with stedy in months. Pt continues to require max A for ADls. Recommend placement and ongoing OT.  Progress with OT: Good progress  Current maximal level of assist: Maximal assist  Next session focus:      Recommendations    Recommended Discharge Disposition Placement for continued therapy   Discharge DME Recommendations Defer to next level of care   Equipment Recommendations     DME Comment     Discharge Recommendations Comment     Rehab Potential Patient participates well in therapy and is progressing towards goal   Anticipated Assistance Available at Discharge Family;Spouse/Significant other   Anticipated Type of Assistance Available at Discharge Assist as needed   Anticipated Time of Assistance Available at Discharge Unknown at this time   Barriers to Discharge Medical issues   Patient's current functional ability appropriate for D/C recommendations Yes     Inpatient Recommendations  OT Inpatient Recommendations Bed in chair position;Encourage participation in ADLs;Delirium precautions;Out of bed for meals and ADLs;Frequent turns   Recommendation Comments Hoyer lift to high back chair or wheelchair   Current Maximal Level of Assist Needed Maximal assist     Brace/Precautions   Brace/Orthotic/Prosthetic:   Precautions:Has weight bearing limitation or precaution: Yes  Precautions and weight  bearing status comments: Falls, delirium, skin, aspiration, contact     Subjective Report:"I did do it"    Patient/Family Goal:     Objective Findings and Interventions    Areas of Occupation    Grooming and Light Hygiene     Cueing Required     Comment     Self Feeding     Cueing Required     Comment     Toileting     Cueing Required     Toileting Clothing Management     Cueing Required     Comment     Upper Body Dressing     Cueing Required     Comment     Lower Body Dressing Maximal assistance   Cueing Required     Comment don brief   Upper Body Bathing     Cueing Required     Comment     Lower Body Bathing     Cueing Required     Comment     ADL/IADL Comment         Functional Transfers    Bed Mobility From Supine   Bed Mobility To Sitting EOB   Level of Assist Moderate assistance   Cueing Required     Transfer From Sit   Transfer To Stand   Level of Assist Maximal assistance   Cueing Required     Technique     Functional Transfer Comment Pt supine to sit w/ HOB elevated and mod A. Pt demonstrated CG sitting balance EOB for 10 minutes. Sit to stand w/ stedy 2p max A. Pt  reported increased calf pain returned to EOB. sit to stand 2p max A and built up blankets heels, able to close flaps. Transfer to w/c. Sit to stand w/ 2p mod A from flaps. Pt setup for breakfast in w/c.     Functional Balance for ADLs  Position Static Dynamic   Sit Static sitting level of assist: Contact guard assist-   Static sitting comment: EOB Dynamic sitting level of assist: Moderate assist-  Dynamic sitting comment: scooting forward     Stand  -      -            [facility]	[place] AM-PAC for ADLs  6 Click Score: 16    Client Factors  Cognitive deficits noted in the following area(s):: Orientation, Attention, Memory  Cognition comment:Oriented to year and self. "August"    Organization;Information processing;Working Chief of Staff functions comment:                                                     OT Education  Education  Content: Plan  of care;Activity recommendations;Discharge recommendations  Response: Needs reinforcement;Verbalizes understanding;Demonstrates understanding    OT Plan of Care  Planned OT Interventions: Activities of Daily Living retraining;Energy conservation education;Functional transfer training;Assistive/Adaptive device training;Cognitive re-training;Pain Management;Patient/Family/Caregiver education;Therapeutic exercise training;Delirium management;Mental health and wellness  OT Prognosis for Goals: Good  OT Frequency: 4x/wk          Roma Schanz, OT  05/21/2022 2:08 PM

## 2022-05-21 NOTE — Progress Notes (Signed)
[facility] PROGRESS NOTE     This is an independent service.   The available consultant for this service is Marion Downer, MD.           24 Hour Course  -HDS, afebrile  -Bmx4 today, no change in loperamide  -Na trending down, no change to diuretics today - will adjust when Na <130  -Peer to peer w/ insurance for SNF placement authorization 7/7 - approved, however patient had prior appeal that will need to be escalated - see CM note  -s/p paracentesis 3.5 Liters removed - gram stain unrevealing  -R hip X-ray with joint disease, but no c/f myeloma lytic disease    Subjective  Continues to work with PT/OT - optimistic she'll do well at rehab facility.  Reports new R hip pain, but able to continue PT.  Has gas pain at night, which she attributes to tube feed - increased simethicone  No other complaints today    Vitals  Temp:  [36.7 C (98.1 F)-37.3 C (99.1 F)] 37.1 C (98.7 F)  Heart Rate:  [76-92] 76  *Resp:  [16] 16  BP: (87-109)/(64-73) 101/65  SpO2:  [97 %-100 %] 100 %    MostRecent Weight: 67.4 kg (148 lb 9.4 oz)  Admission Weight: 66.2 kg (146 lb)      Intake/Output Summary (Last 24 hours) at 05/21/2022 1842  Last data filed at 05/21/2022 1800  Gross per 24 hour   Intake 1885 ml   Output 800 ml   Net 1085 ml       Physical Exam  Gen: NAD, appropriate and engaged  HEENT: MMM, no oral lesions, sclera non-icteric  Resp: CTAB, no rhonchi/wheezing  Card: RRR  Gi: distended, firm, non-tender, +bs x4  Ext: negative BLE edema   Skin: scattered ecchymosis to BUE  Neuro: Alert, oriented x3, no focal neuro deficits  Lines: PIV x1 in place, clean dry & intact dressing    Scheduled Meds:   0.9% sodium chloride flush  3 mL Intravenous Q12H [place]    cholecalciferol (vitamin D3)  2,000 Units Per G Tube Daily [place]    cyanocobalamin (Vitamin B12)  1,000 mcg Subcutaneous Q7 Days    folic acid  1 mg Per G Tube Daily [place]    furosemide  60 mg Per G Tube Daily [place]    lansoprazole  30 mg Per G Tube Q AM Before Breakfast [place]     letermovir  480 mg Other Daily [place]    loperamide  4 mg Feeding Tube Q8H    mirtazapine  30 mg Per G Tube Daily At Bedtime [facility]    multivitamin with iron-minerals-folic acid  1 tablet Feeding Tube Daily SCH    ondansetron  8 mg Per G Tube TID    rifAXIMin  550 mg Feeding Tube BID SCH    simethicone  160 mg Oral 4x Daily [place]    spironolactone  150 mg Per G Tube Daily [place]    thiamine mononitrate (vit B1)  100 mg Per G Tube Daily [place]    ursodiol  600 mg Per G Tube BID [place]     Continuous Infusions:  PRN Meds:   0.9% sodium chloride flush  3 mL Intravenous PRN    acetaminophen  650 mg Per G Tube Q8H PRN    albuterol  2.5 mg Nebulization Q4H PRN    Or    albuterol  2.5 mg Nebulization Q4H PRN    aluminum-magnesium hydroxide-simethicone  15 mL  Per G Tube Q4H PRN    HYDROcodone-acetaminophen  1-2 tablet Per G Tube Q6H PRN    HYDROmorphone  1 mg Oral Q4H PRN    hydrOXYzine  25 mg Feeding Tube Q4H PRN    opium  6 mg Oral 4x Daily PRN    prochlorperazine  5 mg Intravenous Q8H PRN    Or    prochlorperazine  5 mg Per G Tube Q8H PRN     Data  CBC        05/21/22  0533   WBC 6.2   HGB 8.2*   HCT 25.5*   PLT 145     Coags  No results found in last 36 hours    Chem7        05/21/22  0533   NA 130*   K 4.5   CL 98*   CO2 27   BUN 25   CREAT 0.70   GLU 96     Electrolytes        05/21/22  0533   CA 8.0*   MG 1.9   PO4 3.2     Liver Panel        05/21/22  0533   AST 47*   ALT 18   ALKP 195*   TBILI 0.5     UA  No results found in last 7 days    Microscopy:  No results found in last 7 days    Microbiology Results (last 72 hours)       Procedure Component Value Units Date/Time    Bacterial Culture, Normally Sterile Sites, With Gram Stain [[id number]] Collected: 05/21/22 1141    Order Status: Completed Specimen: Not Applicable from Peritoneal Fluid Updated: 05/21/22 1500     Gram Stain Rare RBCs , Few Host cells (not PMNs, RBCs, or squamous epithelial cells) , No PMNs seen      No organisms seen     Bacterial Culture, Sterile Sites  PENDING    Bacterial Culture, Normally Sterile Sites, With Gram Stain [[id number]] Collected: 05/11/22 1115    Order Status: Completed Specimen: Not Applicable from Peritoneal Fluid Updated: 05/21/22 1255     Gram Stain Rare Host cells (not PMNs, RBCs, or squamous epithelial cells) , No PMNs seen      No organisms seen     Bacterial Culture, Sterile Sites No growth 10 days.    Cytomegalovirus DNA, Quantitative PCR, Plasma [[id number]]  (Abnormal) Collected: 05/18/22 0611    Order Status: Completed Specimen: Blood from Serum Updated: 05/19/22 1626     CMV DNA (IU/mL) 50 IU/mL      Comment: *SUBCRITICAL*  Performed by Real-Time PCR  Note new reference range.          CMV DNA (Log IU/mL) 1.70     Comment: Log10 IU/mL  Note new reference range.               Radiology Results  Discussed in A/P    I discussed the patient with Marion Downer, MD from [facility] regarding A&P.    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  2011: VRd bortezomib, Lenalidomide, Dexamethasone  04/2011: Auto SCT  07/2014: Rd - revlimid 15 mg every other day, dexamethasone 8 mg PO weekly.  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   Receiving dexamethasone 12 mg weekly for diarrhea as outpt  10/16/21 - last Daratumumab dose (on hold due to diarrhea)     DATA:   09/07/2010: IgG Kappa MM  12/02/2021: Ig M <5, IgG 447, IgA 20, Ig E <2, SFLC Kappa 16.6, Lambda 1.6, Kappa/Lamda 10.38  12/14/21: IgG K on IFE, IgG 430, KLC 16.6, LLC 2.7, KLR 6.15  02/23/22 SPEP with stable M-spike, SFLC decreased Kappa 44.2 / Lambda 5.5 = K/L ratio = 8.04 (overall downtrending  still)  04/23/22 M-spike 0.6, IgG K on IFE, IgG 997, Kappa 70.7/Lambda 19.8 = K/L ratio = 3.57  Monitor MM labs monthly per Dr Jacelyn Grip, next due 7/9     Chemo:  TBD, therapy held since 10/2021 due to recurrent infections and now deconditioning  Not currently a candidate due to significant deconditioning, but functional status improving and may become a candidate in the future with ongoing rehab.     Supportive Care:   Transfuse for Hgb <7 and Platelets <10  Access: PIV     Primary Oncologist:   Dr. Jacelyn Grip ([place]), will transfer to Dr. Hassell Done ([place])  Dr. Iona Coach ([place])     Disposition:  Lives in [place] - anticipate discharge to [place]   -Peer to Peer 7/7 with favorable outcome, however patient with already enacted appeal, which will have to be re-appealed - CM following  '[ ]'$  will need paracentesis q7-10 days coordinated outpatient with IR and transportation arranged with family      # Immunocompromised  # Diarrhea - controlled  # c/b CMV Viremia  # Hx prolonged norovirus infection  Immunocompromised due to underlying  malignancy, not currently neutropenic. Recent extended hospitalization at both [place] 1/23-04/16/22, with hypovolemic shock associated with norovirus, CMV colitis, acute liver failure associated with pomylast (see ALF section).  Currently admitted for ongoing diarrhea and FTT in brief time at home after dc from [facility], initially began in 8/22, recently managed at home w/ loperamide BID w/ 2-3 watery stools daily. Diarrhea worsened on 6/12 (11 stools). Possibly r/t to inadequate dosing of loperamide vs infectious etiology. GI c/s 6/13 for possible scope - per patient and family declined endoscopy at this time. ID consultation deferred as infectious w/u negative.   Given CMV improving, norovirus negative, and enteric panel unrevealing - favor non-infectious etiologies for diarrhea. ID has requested we maintain enteric precautions for hospitalization d/t prolonged prior GI  infections.   Diarrhea ~4 stools/day as of 7/1, improved to 1 soft/formed stool/d on 7/6.    Data  - Norovirus  - First +norovirus 08/2021 with persistent +norovirus s/p 7-day courses x 2 nitazoxanide (2/16-02/22/23 & 01/2022). Continued to be norovirus positive (most recently on 04/07/22). Norovirus, cdiff, GI PCR neg this admission (6/9).   - CMV  -Regarding CMV colitis, 12/17/21 flex sig showed CMV colitis, sp multiple cycles of gancyclovir IV, IVIG, valcyte 900 mg PO BID, then ultimately transitioned to letermovir, which she continues. 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 Acute rise in 11/2021,coinciding with acute CMV viremia but liver biopsy CMV stains negative (12/11/21). 12/14/21: Hepatitis/viral work-up negative in 11/2021. LP was done on 12/16/21 for hepatic encephalopathy w/ CMV CSF PCR negative. Suspected to be 2/2 pomalidamide, though liver path from 12/22/21 unlikely for vanishing bile duct syndrome.   Upon admission was more somnolent and one episode of possible visual hallucination reported on 6/23. Her mentation has improved throughout this hospitalization. A&Ox3 as of 7/6.   C/s to Hepatology (7/2) regarding optimizing medical management and improving ascites: ascites felt to be primarily  2/2 malnutrition (see below) and some element of portal HTN. Goal will be to reduce paracentesis need to 1x/month by optimizing diuresis and improving nutritional status. Appreciate recs. Serum Na trending down as of 7/6 (132), asymptomatic.     Dx:  - 6/17 KUB: mild ileus and density over abdomen (likely ascitic fluid) - not clinically significant as patient is having regular bowel movements and no PVR noted.  - 6/20 therapeutic paracentesis by TAPS, -4L + albumin  - 6/23 ammonia normal  - 6/27 paracentesis by TAPS -5L + albumin, SBP unrevealing or infection. cytology negative for malignancy  - 7/2 RUQ u/s w/ doppler: Mild liver surface nodularity c/w early cirrhosis and/or fibrosis. Patent vasculature with normal direction of flow. Sequela of portal hypertension including increased now moderate ascites and splenomegaly. Cholelithiasis.  - 7/7 s/p paracentesis by TAPS - 3.5 L fluid removed - CCD, gram stain unrevealing for SBP    Plan:  -Appreciate hepatology recs:  -Continue spironolactone 150 mg + furosemide 60 mg to minimize accumulation of ascites - consider reducing spironolactone if sodium worsens <130 on 7/8 - (started 6/16, increased 6/19-, last incr 7/3- )  -Hold for SBP<90 or MAP<60  -Monitor I/Os & serum Na  -goal of optimizing so that paracentesis requirement is improved to 1x/month  -Currently requiring paracentesis q7-10d - will need to be arranged for d/c   -Follow up Alpha 1 antitrypsin stool study sent 7/4 '[ ]'$  - rule out malabsorption disorder   -Continue Ursodiol 600 mg PO BID  -Continue rifaximin 550 mg PO BID  -Low sodium diet - challenging in context of tube feeds  -2L fluid restriction  -Delirium precautions  -[place] surveillance next in 11/2022     # Failure to thrive  # PEG tube dependence  # Macrocytic anemia - B12 deficiency  PEG tube placed during last admission. Low B12 level but MMA normal, which is not consistent with B12 deficiency, however still treating at this time as below. Encouraged  PO intake in addition to tube feeds. Transitioned to cyclic tube feeds on 9/79.  6/29 - tolerating cyclic TF and improving oral intake  7/3 - tolerating more oral intake, including solids  7/6 - continued improvement in oral intake, including ~50% of lunch on 7/5    Plan:  -Continue cyclic TF - Nutren 1.5 @ 75 mL x 16 hours (1800-1000) - TID residuals per  RN protocol  -RD following -  -Continue Thera-M Plus MVI for copper deficiency (Copper = 47 on 6/16)  -Continue mirtazapine '30mg'$  nightly (incr 6/18- ) per palliative recs  -Continue cyanocobalamin 1072mg SQ weekly (6/24- )   -s/p Cyanocobalamin 10056m SQ daily x7 days (6/17-6/23)  -s/p Zinc acetate 6/11-6/23)    # Dysphagia  Dysphagia and aspiration risk assessed by speech language pathology. SLP recommended Modified Barium Swallow (completed on 6/21) which showed moderate pharyngeal aspiration; SLP recommends regular diet w/ mildly thickened liquids.   6/22 - Pt and daughter request to thicken liquids themselves at bedside for more variety, understand risk of aspiration w/ thins. Liberalized diet per their request to regular.   Appreciate SLP recs.   - See failure to thrive section as well re: PEG tube and nutritional deficiencies     # Pain  # Pressure Injury (sacrum)  Pain primarily r/t pressure injuries though occasionally also d/t abdominal cramping. Wound healing likely limited d/t poor nutritional status (see above) and limited mobility (improving).  -Consulted Palliative Care 6/17 for SMS, now s/o  -Continue hydrocodone-acetaminophen PRN  -Continue hydromorphone per G tube PRN     # Physical Deconditioning  Pt motivated to participate w/ PT/OT, deconditioned after prior extended hospitalization (as above). Showing marked improvements in anterioir weight shifting in WC, endurance w/ static siting balance & bed mobility. Still limited by global weakness, impaired balance, decreased endurance and fatigue.  - Continue PT/OT   - SNF placement recommended    #  GOC  Pt is consistent and clear that she wishes to pursue full medical management of her ongoing medical problems, as above, with the goal of prolonging her life. She is motivated by her desire to spend time with her family. This is consistent in conversations with [facility] (6/18) & Dr. WoJacelyn Grip6/21).     VTE PPx:  Contraindicated: Anticipated procedure    Severity of Illness  Stable    Nutritional Assessment  Severe Chronic disease or condition related malnutrition    During this hospitalization the patient is also being treated for:  - Anemia associated with malignancy (present on admission)    - Thrombocytopenia (present on admission)    - Coagulopathy (present on admission)    - Malignancy associated fatigue on admission  - Neoplasm/malignancy associated pain  - Hypoalbuminemia (present on admission)  - HYPOnatremia (present on admission)  - Fluid status: Volume overload on admission  - Cachexia  - Acute liver failure, present on admission    Code Status: FULL          I spent a total time of 80 minutes in this episode of care preparing to see the patient, obtaining and/or reviewing separately obtained history, performing a medically appropriate examination and/or evaluation, counseling and educating patient/family/caregiver, ordering and reviewing medications, tests, or procedures, referring and communicating with other health care professionals, documenting clinical information in the electronic or other health record, and communicating results to the patient/family/caregiver, and/or other care coordination.    CaGardiner RhymeNP  Hospital Identification #:  [id number]

## 2022-05-21 NOTE — Consults (Signed)
NUTRITION SERVICES ASSESSMENT    Patient History:   68F MM w/ chronic diarrhea (CMV colitis vs protracted norovirus infection - now cleared), acute liver injury, admitted for workup of persistent diarrhea (non-infectious, now at baseline 1-2/day) and FTT     Pertinent interval history: nutrition following for TF management .     In-House Nutrition Orders:  Regular Diet     Tube Feeding-Adult (Cyclic): Nutren 1.5   Order Comments:Feeding Route: PEG - Percutaneous Endoscopic Gastrostomy  Run 75 mL/hr for 16 hours from 18:00 to 10:00.    Check residuals every 8 hours during feeding time and hold if equal to or greater than 500 mL.      Anthropometric Data and Assessment:  Height: 153 cm  Ideal Body Weight: 45.99 kg    Usual body weight:90.91 kg   Admit weight: 66.2 kg (146 lb) (04/25/22 1427) (Bed scale)  Current weight: 67.4 kg (148 lb 9.4 oz) (05/14/22 0915) (Bed scale)    Calculation Weight: 69 kg (Percent IBW (calculated) (%): 150)  Calc weight BMI: 29.5 kg/m^2  Alternative Weight (kg): 53.4 kg (MAW)    Wt Readings from Last 10 Encounters:   05/14/22 67.4 kg (148 lb 9.4 oz)   01/15/22 96 kg (211 lb 10.3 oz)   12/12/18 96.3 kg (212 lb 4.9 oz)   08/08/18 98.9 kg (218 lb 1.6 oz)   05/23/18 98.7 kg (217 lb 9.6 oz)   04/11/18 98.5 kg (217 lb 1.6 oz)   01/10/18 98.4 kg (217 lb)   09/16/17 96.6 kg (213 lb)   06/21/17 96.4 kg (212 lb 9.6 oz)   03/15/17 95.6 kg (210 lb 12.8 oz)       Total weight change: 1.2 kg (1.81 %)  Time frame for weight change: 2 weeks    Assessment of weight change: Weight stable  Wt stable in-house per chart, no new weight since 6/30    Nutrition-focused physical findings:   Visual Assessment: c/w anthros, sunken temples visible    Physical Findings (Exam Date 6/30)  Extremities: Lower extremity edema  Muscles  Temples: Severe Depletion: Hollowing, scooping, depression  Clavicle: Severe Depletion: Protruding, prominent bone  Shoulders: Severe Depletion: Shoulder to arm joint looks square. Bones  prominent. Acromion protrusion very prominent.  Interosseous Muscles: Mild/Moderate Depletion: Slightly depressed  Patellae:  (UTA, pt declined lower body NFPE)  Quadriceps:  (UTA, pt declined lower body NFPE)  Calves:  (UTA, pt declined lower body NFPE)  Adipose  Orbital: Mild/Moderate Depletion: Slightly dark circles, somewhat hollow look  Triceps: Well-nourished: Ample fat tissue obvious between folds of skin    Edema (per RN assessment):   Generalized Edema: Non-pitting (05/21/22 0950)  Sacral: Mild pitting, slight indentation (05/16/22 0834)  RLE Edema: Non-pitting (05/18/22 2057)  LLE Edema: Non-pitting (05/18/22 2057)    Cognition (per RN assessment):  Level of Consciousness: Alert, Awake  Orientation Level: Oriented to situation, Oriented to person, Oriented to time, Oriented to place  Cognition: Short Term Memory Loss  Speech: Delayed responses    GI Findings and Assessment: LBMx1 today      GI: Excessive flatus   Per RN assessment:   Gastrointestinal  *Gastrointestinal (WDL): Exceptions to WDL  Abdomen Inspection: Distended, Rounded, Tube/drain  Bowel Sounds (All Quadrants): Present  Tenderness: Tenderness    Lines, Drains, Airway, Wounds:   Patient Lines/Drains/Airways Status       Active LDAs       Name Placement date Placement time Site Days    Peripheral IV  05/11/22 Left;Posterior Hand 05/11/22  1155  Hand  10    Wound 12/19/21 Skin tear Sacrum 12/19/21  1900  Sacrum  152    Wound 04/14/22 Back Right 04/14/22  --  Back  37    Wound 04/16/22 Abdomen Right;Left 04/16/22  --  Abdomen  35    Feeding Tube Left lower quadrant --  --  LLQ  --                  Food and Nutrition Intake:   Information obtained from: Patient, Chart, Family  Percentage of meals eaten for the past 168 hrs:   Percent Meals Eaten (%)   05/19/22 2000 0 %   05/19/22 1046 20 %   05/19/22 0041 0 %   05/18/22 2041 0 %   05/18/22 1650 50 %   05/18/22 1037 0 %   05/18/22 0355 0 %   05/17/22 1800 50 %   05/17/22 1547 50 %   05/17/22 0950 0  %   05/17/22 0101 0 %   05/16/22 1800 0 %   05/16/22 1230 20 %   05/16/22 0834 0 %   05/15/22 2006 0 %   05/15/22 1509 0 %   05/15/22 1038 0 %   05/14/22 2127 20 %   05/14/22 1719 20 %       Enteral/Parenteral Nutrition Intake  Enteral Nutrition Intake Details: 6/27 transitioned to cycle TF, currently running @ goal       Oral Food and Fluid Intake  Meal Intake Percentage: <50% of meals  Poor Meal Intake Assessment Period: greater than or equal to 7 days    Diet Recall: Other (comment)  Diet Recall Details: minimal PO in-house continues, TF meeting estimated needs.       Intake Data (Date(s): 6/30-7/4)  Average Daily Intake   Calories (total) Protein (total)   Oral Nutrition       Enteral Nutrition 1278 kcal (met 78% of needs) 58 g (met 84% of needs)   Parenteral Nutrition       Total intake per day 1278 kcal 58 g        Assessment of energy/protein intake: Adequate protein/energy intake (greater than or equal to 75% estimated needs)        Barriers to adequate nutrient intake: Poor appetite       Significant Labs:   Biochemical:  Lab Results   Component Value Date    Sodium, Serum / Plasma 130 (L) 05/21/2022    Potassium, Serum / Plasma 4.5 05/21/2022    Chloride, Serum / Plasma 98 (L) 05/21/2022    Carbon Dioxide, Total 27 05/21/2022    Urea Nitrogen, Serum / Plasma 25 05/21/2022    Creatinine 0.70 05/21/2022    eGFRcr 98 05/21/2022    Glucose 101 (H) 05/10/2019    Glucose, non-fasting 96 05/21/2022    Calcium, total, Serum / Plasma 8.0 (L) 05/21/2022    Calcium, Ionized, whole blood 1.17 04/23/2022    Magnesium, Serum / Plasma 1.9 05/21/2022    Phosphorus, Serum / Plasma 3.2 05/21/2022     Lab Results   Component Value Date    Alanine transaminase 18 05/21/2022    AST 47 (H) 05/21/2022    Alkaline Phosphatase 195 (H) 05/21/2022    Bilirubin, Direct 6.2 (H) 01/15/2022    Bilirubin, Total 0.5 05/21/2022    Gamma-Glutamyl Transpeptidase 93 (H) 05/06/2022    Triglycerides, serum 259 (H) 12/20/2021    PT 15.1 (H)  05/17/2022    Ammonia 32 05/08/2022     Lab Results   Component Value Date    Lactate, whole blood 1.0 04/23/2022     Lab Results   Component Value Date    Hemoglobin 8.2 (L) 05/21/2022    MCV 102 (H) 05/21/2022    Abs Neutrophils 4.47 05/21/2022     Lab Results   Component Value Date    Glucose, Glucometer 79 05/07/2022    Glucose, Glucometer 89 12/30/2021    Glucose, Glucometer 99 12/29/2021    Glucose, Glucometer 105 12/29/2021    Glucose, Glucometer 84 12/28/2021    Glucose, Glucometer 88 12/28/2021    Glucose, Glucometer 93 12/27/2021    Glucose, Glucometer 81 12/27/2021    Glucose, Glucometer 102 12/26/2021    Glucose, Glucometer 86 12/26/2021     Micronutrient Profile:  Lab Results   Component Value Date    Vitamin D, 25-Hydroxy 11 (L) 04/23/2022    Ferritin, Serum/Plasma 948 (H) 12/20/2021    Iron, serum 60 12/11/2021    Transferrin, Serum/Plasma 46 (L) 12/11/2021    % Saturation 93 (H) 12/11/2021    Vitamin B12 242 (L) 04/28/2022    MMA, Serum 0.32 04/28/2022    Folate, serum 12.5 04/28/2022    Copper, serum/plasma 47 (L) 04/30/2022     Inflammatory profile:   Lab Results   Component Value Date    C-Reactive Protein 11.3 (H) 12/13/2021    Albumin, Serum / Plasma 1.9 (L) 05/20/2022    WBC Count 6.2 05/21/2022     Stool studies:  Lab Results   Component Value Date    Alpha-1-Antitrypsin, Random Stool <16 05/18/2022    Clostridium difficile Toxin gene: Not detected 04/23/2022    Clostridium difficile Comment: Negative test 04/23/2022       Significant Meds:   Scheduled Meds:   0.9% sodium chloride flush  3 mL Intravenous Q12H [place]    cholecalciferol (vitamin D3)  2,000 Units Per G Tube Daily [place]    cyanocobalamin (Vitamin B12)  1,000 mcg Subcutaneous Q7 Days    folic acid  1 mg Per G Tube Daily SCH    furosemide  60 mg Per G Tube Daily [place]    lansoprazole  30 mg Per G Tube Q AM Before Breakfast [facility]    letermovir  480 mg Other Daily [place]    loperamide  4 mg Feeding Tube Q8H    mirtazapine  30 mg Per G Tube Daily  At Bedtime [facility]    multivitamin with iron-minerals-folic acid  1 tablet Feeding Tube Daily SCH    ondansetron  8 mg Per G Tube TID    rifAXIMin  550 mg Feeding Tube BID SCH    simethicone  160 mg Oral 4x Daily [place]    spironolactone  150 mg Per G Tube Daily [place]    thiamine mononitrate (vit B1)  100 mg Per G Tube Daily [place]    ursodiol  600 mg Per G Tube BID [place]     Continuous Infusions:  PRN Meds:.0.9% sodium chloride flush, acetaminophen, albuterol **OR** albuterol, aluminum-magnesium hydroxide-simethicone, HYDROcodone-acetaminophen, HYDROmorphone, hydrOXYzine, opium, prochlorperazine **OR** prochlorperazine    Comparative Standards:   Energy Needs: MSJ: 4332- 9518 (based on 1175.25: x 1.4 - 1.5)  Protein Needs: 69.42 g - 80.1 g (1.3 - 1.5 g/kg based on 53.4 kg (MAW))  Fluid needs: [place]iday-Segar: 2001 mL/d     Nutrition diagnosis:   Inadequate oral intake  related to poor appetite, AMS as  evidenced by reliance on EN supplementation to meet nutrient needs, minimal intake on I/Os. Active.          Malnutrition Summary:  Severe Chronic disease or condition related malnutrition related to AMS, poor PO tolerance as evidenced by weight loss of >7.5% in 3 months, severe loss of muscle mass, mild/moderate fat loss. .  Present on admission.         Nutrition Intervention:  Oral Nutrition:    Inpatient Orders: Current diet order appropriate        Nutrition recommendations: Energy Modification, Protein Modification  Energy Modification: Increased energy intake  Protein Modification: Increased protein intake        Oral Nutrition Supplements and/or Food Fortification  Supplemental nourishment plan: Continue oral nutrition supplements  Name and nutrient provision: Dillard [place]  Frequency recommendation: 1x daily     Enteral Nutrition:   Enteral nutrition plan: Continue enteral nutrition as ordered  Cyclic Tube Feedings  --> Cycle Nutren 1.5 @ 75 mL/hr for 16 hrs from 18:00 to 10:00.  Provides 1200 mL, 1800 kcal (100%), 82 gm  protein (100%), 13 gm CHO/hr during cycle, 100% RDI for vitamins/minerals.     Tube Feed Monitoring:  TF M&E: Weigh patient twice weekly  Monitor serum electrolytes, Cr, BUN, Mg, Phos, Ca/iCa daily (until stable)     Hydration:   --> Recommend free water flush of 223m q 12hours  --> TF + free water provides 1601mtotal volume        Vitamins/Minerals  Discontinue ergocalciferol, ordered for daily:   --> Vitamin D (cholecalciferol, 2000 units, daily) FT/PO.  Continue as ordered:   --> MVI, [id number]--> folic acid and thiamin per primary team      Nutrition Related Medication Recommendations:   Other Nutrition Related Medication: Continue loperamide prn    Nutrition Monitoring/Evaluation:  Nutrition Goal Indicator: Energy intake  Nutrition Goal Criteria: TF to meet >75% estimated energy needs  Nutrition Goal Progress: Goal achieved  Nutrition Goal 2 Indicator: Macronutrient intake  Nutrition Goal 2 Criteria: TF to meet >75% estimated protein needs  Nutrition Goal 2 Progress: Goal achieved  Nutrition Goal 3 Indicator: Food intake  Nutrition Goal 3 Criteria: Patient consuming >50% of 1 meal + 1 ONS per day  Nutrition Goal 3 Progress: Some digression away from goal    IsBretta BangMS, RD

## 2022-05-22 LAB — COMPLETE BLOOD COUNT WITH DIFFERENTIAL
Abs Basophils: 0.02 10*9/L (ref 0.00–0.10)
Abs Eosinophils: 0.07 10*9/L (ref 0.00–0.40)
Abs Imm Granulocytes: 0.1 10*9/L — ABNORMAL HIGH (ref <0.10)
Abs Lymphocytes: 0.68 10*9/L — ABNORMAL LOW (ref 1.00–3.40)
Abs Monocytes: 0.56 10*9/L (ref 0.20–0.80)
Abs Neutrophils: 4.31 10*9/L (ref 1.80–6.80)
Hematocrit: 23.4 % — ABNORMAL LOW (ref 36.0–46.0)
Hemoglobin: 7.1 g/dL — ABNORMAL LOW (ref 12.0–15.5)
MCH: 31.7 pg (ref 26.0–34.0)
MCHC: 30.3 g/dL — ABNORMAL LOW (ref 31.0–36.0)
MCV: 105 fL — ABNORMAL HIGH (ref 80–100)
MPV: 13.5 fL — ABNORMAL HIGH (ref 9.1–12.6)
Platelet Count: 89 10*9/L — ABNORMAL LOW (ref 140–450)
RBC Count: 2.24 10*12/L — ABNORMAL LOW (ref 4.00–5.20)
RDW-CV: 16.9 % — ABNORMAL HIGH (ref 11.7–14.4)
WBC Count: 5.7 10*9/L (ref 3.4–10.0)

## 2022-05-22 LAB — MAGNESIUM, SERUM / PLASMA: Magnesium, Serum / Plasma: 1.9 mg/dL (ref 1.6–2.6)

## 2022-05-22 LAB — BASIC METABOLIC PANEL (NA, K, CL, CO2, BUN, CR, GLU, CA)
Anion Gap: 6 (ref 4–14)
Calcium, total, Serum / Plasma: 8 mg/dL — ABNORMAL LOW (ref 8.4–10.5)
Carbon Dioxide, Total: 27 mmol/L (ref 22–29)
Chloride, Serum / Plasma: 98 mmol/L — ABNORMAL LOW (ref 101–110)
Creatinine: 0.61 mg/dL (ref 0.55–1.02)
Glucose, non-fasting: 97 mg/dL (ref 70–199)
Potassium, Serum / Plasma: 4.5 mmol/L (ref 3.5–5.0)
Sodium, Serum / Plasma: 131 mmol/L — ABNORMAL LOW (ref 135–145)
Urea Nitrogen, Serum / Plasma: 20 mg/dL (ref 7–25)
eGFRcr: 101 mL/min/{1.73_m2} (ref >59)

## 2022-05-22 LAB — ASPARTATE TRANSAMINASE: AST: 49 U/L — ABNORMAL HIGH (ref 5–44)

## 2022-05-22 LAB — ALANINE TRANSAMINASE: Alanine transaminase: 19 U/L (ref 10–61)

## 2022-05-22 LAB — PHOSPHORUS, SERUM / PLASMA: Phosphorus, Serum / Plasma: 3.4 mg/dL (ref 2.3–4.7)

## 2022-05-22 LAB — BILIRUBIN, TOTAL: Bilirubin, Total: 0.7 mg/dL (ref 0.2–1.2)

## 2022-05-22 LAB — ALKALINE PHOSPHATASE: Alkaline Phosphatase: 182 U/L — ABNORMAL HIGH (ref 38–108)

## 2022-05-22 MED FILL — PREVYMIS 480 MG TABLET: 480 mg | ORAL | Qty: 1

## 2022-05-22 MED FILL — SIMETHICONE 40 MG/0.6 ML ORAL DROPS,SUSPENSION: 40 mg/0.6 mL | ORAL | Qty: 2.4

## 2022-05-22 MED FILL — LANSOPRAZOLE 3 MG/ML ORAL SUSPENSION COMPOUNDING KIT: 3 mg/mL | ORAL | Qty: 10

## 2022-05-22 MED FILL — THIAMINE MONONITRATE (VITAMIN B1) 100 MG TABLET: 100 mg | ORAL | Qty: 1

## 2022-05-22 MED FILL — THERA M PLUS (FERROUS FUMARATE) 9 MG IRON-400 MCG TABLET: 9 mg iron-400 mcg | ORAL | Qty: 1

## 2022-05-22 MED FILL — FOLIC ACID 1 MG TABLET: 1 mg | ORAL | Qty: 1

## 2022-05-22 MED FILL — HYDROCODONE 5 MG-ACETAMINOPHEN 325 MG TABLET: 5-325 mg | ORAL | Qty: 1

## 2022-05-22 MED FILL — CYANOCOBALAMIN (VIT B-12) 1,000 MCG/ML INJECTION SOLUTION: 1000 mcg/mL | INTRAMUSCULAR | Qty: 1

## 2022-05-22 MED FILL — CHOLECALCIFEROL (VITAMIN D3) 25 MCG (1,000 UNIT) TABLET: 1000 UNITS | ORAL | Qty: 2

## 2022-05-22 MED FILL — GUAIFENESIN 100 MG/5 ML ORAL LIQUID: 100 mg/5 mL | ORAL | Qty: 10

## 2022-05-22 MED FILL — ONDANSETRON HCL 4 MG TABLET: 4 mg | ORAL | Qty: 2

## 2022-05-22 MED FILL — FUROSEMIDE 20 MG TABLET: 20 mg | ORAL | Qty: 3

## 2022-05-22 MED FILL — URSODIOL (BULK) 100 % POWDER: 100 % | Qty: 0.6

## 2022-05-22 MED FILL — MIRTAZAPINE 30 MG TABLET: 30 mg | ORAL | Qty: 1

## 2022-05-22 MED FILL — XIFAXAN 550 MG TABLET: 550 mg | ORAL | Qty: 1

## 2022-05-22 MED FILL — ANTI-DIARRHEAL (LOPERAMIDE) 2 MG TABLET: 2 mg | ORAL | Qty: 2

## 2022-05-22 MED FILL — SPIRONOLACTONE 25 MG TABLET: 25 mg | ORAL | Qty: 2

## 2022-05-22 NOTE — Plan of Care (Signed)
Problem: Discharge Planning - Adult  Goal: Knowledge of and participation in plan of care  Outcome: Progress within 12 hours     Problem: Fall, at Risk or Actual - Adult  Goal: Absence of falls and fall related injury  Outcome: Progress within 12 hours     Problem: Delirium - Adult / Pediatric  Goal: Absence or resolution of delirium  Outcome: Progress within 12 hours     Problem: Nutrition, Alteration in -Adult  Goal: Adequate nutritional intake  Outcome: Progress within 12 hours  Goal: Maximize nutritional intake per patient condition  Outcome: Progress within 12 hours     Problem: Pressure Injury,At Risk and Actual- Adult  Goal: Absence of any new pressure injury  Outcome: Progress within 12 hours  Goal: Pressure injury healing/stabilization  Outcome: Progress within 12 hours  Goal: Participation in preventative efforts and treatment plan (Patient/Family/Caregiver)  Outcome: Progress within 12 hours  Goal: Adequate nutritional intake  Outcome: Progress within 12 hours     Problem: GI Elimination Impaired - Adult  Goal: Passage of soft, formed stool  Outcome: Progress within 12 hours  Goal: Stool elimination per clinical condition ( e.g. GI ostomies )  Outcome: Progress within 12 hours

## 2022-05-22 NOTE — Progress Notes (Signed)
[facility] PROGRESS NOTE     This is an independent service.            24 Hour Course  -HDS, afebrile  -Bmx2 today, no change in loperamide  -Na trending down, no change to diuretics today - will adjust when Na <130  -peritoneal cx no organism seen    Subjective  Continues to work with PT/OT - optimistic she'll do well at rehab facility.  Has some mild cough.    Vitals  Temp:  [37.1 C (98.7 F)-37.2 C (99 F)] 37.1 C (98.8 F)  Heart Rate:  [76-85] 76  *Resp:  [16-18] 16  BP: (91-105)/(65-71) 91/71  SpO2:  [99 %-100 %] 100 %    MostRecent Weight: 67.4 kg (148 lb 9.4 oz)  Admission Weight: 66.2 kg (146 lb)      Intake/Output Summary (Last 24 hours) at 05/22/2022 1146  Last data filed at 05/22/2022 1100  Gross per 24 hour   Intake 1845 ml   Output 4850 ml   Net -3005 ml       Physical Exam  Gen: NAD, appropriate and engaged  HEENT: MMM, no oral lesions, sclera non-icteric  Resp: CTAB, no rhonchi/wheezing  Card: RRR  Gi: distended, firm, non-tender, +bs x4  Ext: negative BLE edema   Skin: scattered ecchymosis to BUE  Neuro: Alert, oriented x3, no focal neuro deficits  Lines: PIV x1 in place, clean dry & intact dressing    Scheduled Meds:   0.9% sodium chloride flush  3 mL Intravenous Q12H [place]    cholecalciferol (vitamin D3)  2,000 Units Per G Tube Daily [place]    cyanocobalamin (Vitamin B12)  1,000 mcg Subcutaneous Q7 Days    folic acid  1 mg Per G Tube Daily [place]    furosemide  60 mg Per G Tube Daily [place]    lansoprazole  30 mg Per G Tube Q AM Before Breakfast [facility]    letermovir  480 mg Other Daily [place]    loperamide  4 mg Feeding Tube Q8H    mirtazapine  30 mg Per G Tube Daily At Bedtime [facility]    multivitamin with iron-minerals-folic acid  1 tablet Feeding Tube Daily SCH    ondansetron  8 mg Per G Tube TID    rifAXIMin  550 mg Feeding Tube BID SCH    simethicone  160 mg Oral 4x Daily [place]    spironolactone  150 mg Per G Tube Daily [place]    thiamine mononitrate (vit B1)  100 mg Per G Tube Daily [place]    ursodiol  600 mg  Per G Tube BID [place]     Continuous Infusions:  PRN Meds:   0.9% sodium chloride flush  3 mL Intravenous PRN    acetaminophen  650 mg Per G Tube Q8H PRN    albuterol  2.5 mg Nebulization Q4H PRN    Or    albuterol  2.5 mg Nebulization Q4H PRN    aluminum-magnesium hydroxide-simethicone  15 mL Per G Tube Q4H PRN    guaiFENesin  200 mg Oral Q4H PRN    HYDROcodone-acetaminophen  1-2 tablet Per G Tube Q6H PRN    HYDROmorphone  1 mg Oral Q4H PRN    hydrOXYzine  25 mg Feeding Tube Q4H PRN    opium  6 mg Oral 4x Daily PRN    prochlorperazine  5 mg Intravenous Q8H PRN    Or    prochlorperazine  5 mg Per G Tube  Q8H PRN     Data  CBC        05/22/22  0547 05/21/22  0533   WBC 5.7 6.2   HGB 7.1* 8.2*   HCT 23.4* 25.5*   PLT 89* 145     Coags  No results found in last 36 hours    Chem7        05/22/22  0547 05/21/22  0533   NA 131* 130*   K 4.5 4.5   CL 98* 98*   CO2 27 27   BUN 20 25   CREAT 0.61 0.70   GLU 97 96     Electrolytes        05/22/22  0547 05/21/22  0533   CA 8.0* 8.0*   MG 1.9 1.9   PO4 3.4 3.2     Liver Panel        05/22/22  0547 05/21/22  0533   AST 49* 47*   ALT 19 18   ALKP 182* 195*   TBILI 0.7 0.5     UA  No results found in last 7 days    Microscopy:  No results found in last 7 days    Microbiology Results (last 72 hours)       Procedure Component Value Units Date/Time    Bacterial Culture, Normally Sterile Sites, With Gram Stain [[id number]] Collected: 05/21/22 1141    Order Status: Completed Specimen: Not Applicable from Peritoneal Fluid Updated: 05/21/22 1500     Gram Stain Rare RBCs , Few Host cells (not PMNs, RBCs, or squamous epithelial cells) , No PMNs seen      No organisms seen     Bacterial Culture, Sterile Sites PENDING    Bacterial Culture, Normally Sterile Sites, With Gram Stain [[id number]] Collected: 05/11/22 1115    Order Status: Completed Specimen: Not Applicable from Peritoneal Fluid Updated: 05/21/22 1255     Gram Stain Rare Host cells (not PMNs, RBCs, or squamous epithelial cells) , No PMNs  seen      No organisms seen     Bacterial Culture, Sterile Sites No growth 10 days.    Cytomegalovirus DNA, Quantitative PCR, Plasma [[id number]]  (Abnormal) Collected: 05/18/22 0611    Order Status: Completed Specimen: Blood from Serum Updated: 05/19/22 1626     CMV DNA (IU/mL) 50 IU/mL      Comment: *SUBCRITICAL*  Performed by Real-Time PCR  Note new reference range.          CMV DNA (Log IU/mL) 1.70     Comment: Log10 IU/mL  Note new reference range.               Radiology Results  Discussed in A/P    I discussed the patient with Marion Downer, MD from [facility] regarding A&P.    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  2011: VRd bortezomib, Lenalidomide, Dexamethasone  04/2011: Auto SCT  07/2014: Rd - revlimid 15 mg every other day, dexamethasone 8 mg PO weekly.   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   Receiving dexamethasone 12  mg weekly for diarrhea as outpt  10/16/21 - last Daratumumab dose (on hold due to diarrhea)     DATA:   09/07/2010: IgG Kappa MM  12/02/2021: Ig M <5, IgG 447, IgA 20, Ig E <2, SFLC Kappa 16.6, Lambda 1.6, Kappa/Lamda 10.38  12/14/21: IgG K on IFE, IgG 430, KLC 16.6, LLC 2.7, KLR 6.15  02/23/22 SPEP with stable M-spike, SFLC decreased Kappa 44.2 / Lambda 5.5 = K/L ratio = 8.04 (overall downtrending still)  04/23/22 M-spike 0.6, IgG K on IFE, IgG 997, Kappa 70.7/Lambda 19.8 = K/L ratio = 3.57  Monitor MM labs monthly per Dr Jacelyn Grip, next due 7/9     Chemo:  TBD, therapy held since 10/2021 due to recurrent infections and now deconditioning  Not currently a candidate due to significant deconditioning,  but functional status improving and may become a candidate in the future with ongoing rehab.     Supportive Care:   Transfuse for Hgb <7 and Platelets <10  Access: PIV     Primary Oncologist:   Dr. Jacelyn Grip ([place]), will transfer to Dr. Hassell Done ([place])  Dr. Iona Coach ([place])     Disposition:  Lives in [place] - anticipate discharge to [place]   -Peer to Peer 7/7 with favorable outcome, however patient with already enacted appeal, which will have to be re-appealed - CM following  '[ ]'$  will need paracentesis q7-10 days coordinated outpatient with IR and transportation arranged with family      # Immunocompromised  # Diarrhea - controlled  # c/b CMV Viremia  # Hx prolonged norovirus infection  Immunocompromised due to underlying  malignancy, not currently neutropenic. Recent extended hospitalization at both [place] 1/23-04/16/22, with hypovolemic shock associated with norovirus, CMV colitis, acute liver failure associated with pomylast (see ALF section).  Currently admitted for ongoing diarrhea and FTT in brief time at home after dc from [facility], initially began in 8/22, recently managed at home w/ loperamide BID w/ 2-3 watery stools daily. Diarrhea worsened on 6/12 (11 stools). Possibly r/t to inadequate dosing of loperamide vs infectious etiology. GI c/s 6/13 for possible scope - per patient and family declined endoscopy at this time. ID consultation deferred as infectious w/u negative.   Given CMV improving, norovirus negative, and enteric panel unrevealing - favor non-infectious etiologies for diarrhea. ID has requested we maintain enteric precautions for hospitalization d/t prolonged prior GI infections.   Diarrhea ~4 stools/day as of 7/1, improved to 1 soft/formed stool/d on 7/6.    Data  - Norovirus  - First +norovirus 08/2021 with persistent +norovirus s/p 7-day courses x 2 nitazoxanide (2/16-02/22/23 & 01/2022). Continued to be norovirus positive (most recently on 04/07/22). Norovirus, cdiff, GI  PCR neg this admission (6/9).   - CMV  -Regarding CMV colitis, 12/17/21 flex sig showed CMV colitis, sp multiple cycles of gancyclovir IV, IVIG, valcyte 900 mg PO BID, then ultimately transitioned to letermovir, which she continues. 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 Acute rise in 11/2021,coinciding with acute CMV viremia but liver biopsy CMV stains negative (12/11/21). 12/14/21: Hepatitis/viral work-up negative in 11/2021. LP was done on 12/16/21 for hepatic encephalopathy w/ CMV CSF PCR negative. Suspected to be 2/2 pomalidamide, though liver path from 12/22/21 unlikely for vanishing bile duct syndrome.   Upon admission was more somnolent and one episode of possible visual hallucination reported on 6/23. Her mentation has improved throughout this hospitalization. A&Ox3 as of 7/6.   C/s to Hepatology (7/2) regarding optimizing medical management and improving ascites: ascites felt to be primarily 2/2 malnutrition (see below) and some element of portal HTN. Goal will be to reduce paracentesis need to 1x/month by optimizing diuresis and improving nutritional status. Appreciate recs. Serum Na stable at 131, asymptomatic.     Dx:  - 6/17 KUB: mild ileus and density over abdomen (likely ascitic fluid) - not  clinically significant as patient is having regular bowel movements and no PVR noted.  - 6/20 therapeutic paracentesis by TAPS, -4L + albumin  - 6/23 ammonia normal  - 6/27 paracentesis by TAPS -5L + albumin, SBP unrevealing or infection. cytology negative for malignancy  - 7/2 RUQ u/s w/ doppler: Mild liver surface nodularity c/w early cirrhosis and/or fibrosis. Patent vasculature with normal direction of flow. Sequela of portal hypertension including increased now moderate ascites and splenomegaly. Cholelithiasis.  - 7/7 s/p paracentesis by TAPS - 3.5 L fluid removed - CCD, gram stain unrevealing for SBP    Plan:  -Appreciate hepatology recs:  -Continue spironolactone 150 mg + furosemide 60 mg to minimize accumulation of ascites - consider reducing spironolactone if sodium worsens <130 on 7/8 - (started 6/16, increased 6/19-, last incr 7/3- )  -Hold for SBP<90 or MAP<60  -Monitor I/Os & serum Na  -goal of optimizing so that paracentesis requirement is improved to 1x/month  -Currently requiring paracentesis q7-10d - will need to be arranged for d/c   -Follow up Alpha 1 antitrypsin stool study sent 7/4 '[ ]'$  - rule out malabsorption disorder   -Continue Ursodiol 600 mg PO BID  -Continue rifaximin 550 mg PO BID  -Low sodium diet - challenging in context of tube feeds  -2L fluid restriction  -Delirium precautions  -[place] surveillance next in 11/2022     # Failure to thrive  # PEG tube dependence  # Macrocytic anemia - B12 deficiency  PEG tube placed during last admission. Low B12 level but MMA normal, which is not consistent with B12 deficiency, however still treating at this time as below. Encouraged PO intake in addition to tube feeds. Transitioned to cyclic tube feeds on 1/75.  6/29 - tolerating cyclic TF and improving oral intake  7/3 - tolerating more oral intake, including solids  7/6 - continued improvement in oral intake, including ~50% of lunch on 7/5    Plan:  -Continue cyclic TF - Nutren 1.5 @ 75 mL x 16 hours  (1800-1000) - TID residuals per RN protocol  -RD following -  -Continue Thera-M Plus MVI for copper deficiency (Copper = 47 on 6/16)  -Continue mirtazapine '30mg'$  nightly (incr 6/18- ) per palliative recs  -Continue cyanocobalamin 1020mg SQ weekly (6/24- )   -s/p Cyanocobalamin 10030m SQ daily x7  days (6/17-6/23)  -s/p Zinc acetate 6/11-6/23)    # Dysphagia  Dysphagia and aspiration risk assessed by speech language pathology. SLP recommended Modified Barium Swallow (completed on 6/21) which showed moderate pharyngeal aspiration; SLP recommends regular diet w/ mildly thickened liquids.   6/22 - Pt and daughter request to thicken liquids themselves at bedside for more variety, understand risk of aspiration w/ thins. Liberalized diet per their request to regular.   Appreciate SLP recs.   - See failure to thrive section as well re: PEG tube and nutritional deficiencies     # Pain  # Pressure Injury (sacrum)  Pain primarily r/t pressure injuries though occasionally also d/t abdominal cramping. Wound healing likely limited d/t poor nutritional status (see above) and limited mobility (improving).  -Consulted Palliative Care 6/17 for SMS, now s/o  -Continue hydrocodone-acetaminophen PRN  -Continue hydromorphone per G tube PRN     # Physical Deconditioning  Pt motivated to participate w/ PT/OT, deconditioned after prior extended hospitalization (as above). Showing marked improvements in anterioir weight shifting in WC, endurance w/ static siting balance & bed mobility. Still limited by global weakness, impaired balance, decreased endurance and fatigue.  - Continue PT/OT   - SNF placement recommended    # GOC  Pt is consistent and clear that she wishes to pursue full medical management of her ongoing medical problems, as above, with the goal of prolonging her life. She is motivated by her desire to spend time with her family. This is consistent in conversations with [facility] (6/18) & Dr. Jacelyn Grip (6/21).     VTE  PPx:  Contraindicated: Anticipated procedure    Severity of Illness  Stable    Nutritional Assessment  Severe Chronic disease or condition related malnutrition    During this hospitalization the patient is also being treated for:  - Anemia associated with malignancy (present on admission)    - Thrombocytopenia (present on admission)    - Coagulopathy (present on admission)    - Malignancy associated fatigue on admission  - Neoplasm/malignancy associated pain  - Hypoalbuminemia (present on admission)  - HYPOnatremia (present on admission)  - Fluid status: Volume overload on admission  - Cachexia  - Acute liver failure, present on admission    Code Status: FULL          I spent a total time of 80 minutes in this episode of care preparing to see the patient, obtaining and/or reviewing separately obtained history, performing a medically appropriate examination and/or evaluation, counseling and educating patient/family/caregiver, ordering and reviewing medications, tests, or procedures, referring and communicating with other health care professionals, documenting clinical information in the electronic or other health record, and communicating results to the patient/family/caregiver, and/or other care coordination.    Deboraha Sprang, MD

## 2022-05-23 MED FILL — SIMETHICONE 40 MG/0.6 ML ORAL DROPS,SUSPENSION: 40 mg/0.6 mL | ORAL | Qty: 2.4

## 2022-05-23 MED FILL — THIAMINE MONONITRATE (VITAMIN B1) 100 MG TABLET: 100 mg | ORAL | Qty: 1

## 2022-05-23 MED FILL — URSODIOL (BULK) 100 % POWDER: 100 % | Qty: 0.6

## 2022-05-23 MED FILL — FUROSEMIDE 20 MG TABLET: 20 mg | ORAL | Qty: 3

## 2022-05-23 MED FILL — LANSOPRAZOLE 3 MG/ML ORAL SUSPENSION COMPOUNDING KIT: 3 mg/mL | ORAL | Qty: 10

## 2022-05-23 MED FILL — HYDROCODONE 5 MG-ACETAMINOPHEN 325 MG TABLET: 5-325 mg | ORAL | Qty: 1

## 2022-05-23 MED FILL — SPIRONOLACTONE 25 MG TABLET: 25 mg | ORAL | Qty: 2

## 2022-05-23 MED FILL — PREVYMIS 480 MG TABLET: 480 mg | ORAL | Qty: 1

## 2022-05-23 MED FILL — HYDROXYZINE HCL 25 MG TABLET: 25 mg | ORAL | Qty: 1

## 2022-05-23 MED FILL — MIRTAZAPINE 30 MG TABLET: 30 mg | ORAL | Qty: 1

## 2022-05-23 MED FILL — XIFAXAN 550 MG TABLET: 550 mg | ORAL | Qty: 1

## 2022-05-23 MED FILL — THERA M PLUS (FERROUS FUMARATE) 9 MG IRON-400 MCG TABLET: 9 mg iron-400 mcg | ORAL | Qty: 1

## 2022-05-23 MED FILL — CHOLECALCIFEROL (VITAMIN D3) 25 MCG (1,000 UNIT) TABLET: 1000 UNITS | ORAL | Qty: 2

## 2022-05-23 MED FILL — ANTI-DIARRHEAL (LOPERAMIDE) 2 MG TABLET: 2 mg | ORAL | Qty: 2

## 2022-05-23 MED FILL — FOLIC ACID 1 MG TABLET: 1 mg | ORAL | Qty: 1

## 2022-05-23 MED FILL — ONDANSETRON HCL 4 MG TABLET: 4 mg | ORAL | Qty: 2

## 2022-05-23 NOTE — Progress Notes (Addendum)
[facility] PROGRESS NOTE     This is an independent service.            24 Hour Course  -HDS, afebrile  -Bmx2 today, no change in loperamide  -Na stable at 134, no change to diuretics today - will adjust when Na <130  -peritoneal cx no organism seen    Subjective  No abdominal pain. Reports feeling cold in room request more blankets    Vitals  Temp:  [36.8 C (98.2 F)-37.4 C (99.3 F)] 36.8 C (98.2 F)  Heart Rate:  [79-83] 83  *Resp:  [16-18] 16  BP: (93-108)/(67-73) 108/73  SpO2:  [97 %-100 %] 100 %    MostRecent Weight: 67.4 kg (148 lb 9.4 oz)  Admission Weight: 66.2 kg (146 lb)      Intake/Output Summary (Last 24 hours) at 05/23/2022 1144  Last data filed at 05/23/2022 0600  Gross per 24 hour   Intake 1195.03 ml   Output 1700 ml   Net -504.97 ml       Physical Exam  Gen: NAD, appropriate and engaged  HEENT: MMM, no oral lesions, sclera non-icteric  Resp: CTAB, no rhonchi/wheezing  Card: RRR  Gi: distended, firm, non-tender, +bs x4  Ext: negative BLE edema   Skin: scattered ecchymosis to BUE  Neuro: Alert, oriented x3, no focal neuro deficits  Lines: PIV x1 in place, clean dry & intact dressing    Scheduled Meds:   0.9% sodium chloride flush  3 mL Intravenous Q12H [place]    cholecalciferol (vitamin D3)  2,000 Units Per G Tube Daily [place]    cyanocobalamin (Vitamin B12)  1,000 mcg Subcutaneous Q7 Days    folic acid  1 mg Per G Tube Daily [place]    furosemide  60 mg Per G Tube Daily [place]    lansoprazole  30 mg Per G Tube Q AM Before Breakfast [facility]    letermovir  480 mg Other Daily [place]    loperamide  4 mg Feeding Tube Q8H    mirtazapine  30 mg Per G Tube Daily At Bedtime [facility]    multivitamin with iron-minerals-folic acid  1 tablet Feeding Tube Daily SCH    ondansetron  8 mg Per G Tube TID    rifAXIMin  550 mg Feeding Tube BID SCH    simethicone  160 mg Oral 4x Daily [place]    spironolactone  150 mg Per G Tube Daily [place]    thiamine mononitrate (vit B1)  100 mg Per G Tube Daily [place]    ursodiol  600 mg Per G Tube BID [place]      Continuous Infusions:  PRN Meds:   0.9% sodium chloride flush  3 mL Intravenous PRN    acetaminophen  650 mg Per G Tube Q8H PRN    albuterol  2.5 mg Nebulization Q4H PRN    Or    albuterol  2.5 mg Nebulization Q4H PRN    aluminum-magnesium hydroxide-simethicone  15 mL Per G Tube Q4H PRN    guaiFENesin  200 mg Oral Q4H PRN    HYDROcodone-acetaminophen  1-2 tablet Per G Tube Q6H PRN    HYDROmorphone  1 mg Oral Q4H PRN    hydrOXYzine  25 mg Feeding Tube Q4H PRN    opium  6 mg Oral 4x Daily PRN    prochlorperazine  5 mg Intravenous Q8H PRN    Or    prochlorperazine  5 mg Per G Tube Q8H PRN  Data  CBC        05/23/22  0547 05/22/22  0547   WBC 6.1 5.7   HGB 8.8* 7.1*   HCT 27.8* 23.4*   PLT 150 89*     Coags  No results found in last 36 hours    Chem7        05/23/22  0547 05/22/22  0547   NA 134* 131*   K 4.4 4.5   CL 101 98*   CO2 28 27   BUN 21 20   CREAT 0.60 0.61   GLU 110 97     Electrolytes        05/23/22  0547 05/22/22  0547   CA 8.0* 8.0*   MG 2.0 1.9   PO4 3.1 3.4     Liver Panel        05/23/22  0547 05/22/22  0547   AST 49* 49*   ALT 20 19   ALKP 182* 182*   TBILI 0.4 0.7     UA  No results found in last 7 days    Microscopy:  No results found in last 7 days    Microbiology Results (last 72 hours)       Procedure Component Value Units Date/Time    Bacterial Culture, Normally Sterile Sites, With Gram Stain [[id number]] Collected: 05/21/22 1141    Order Status: Completed Specimen: Not Applicable from Peritoneal Fluid Updated: 05/23/22 0839     Gram Stain Rare RBCs , Few Host cells (not PMNs, RBCs, or squamous epithelial cells) , No PMNs seen      No organisms seen     Bacterial Culture, Sterile Sites No growth 2 days.    Bacterial Culture, Normally Sterile Sites, With Gram Stain [[id number]] Collected: 05/11/22 1115    Order Status: Completed Specimen: Not Applicable from Peritoneal Fluid Updated: 05/22/22 1411     Gram Stain Rare Host cells (not PMNs, RBCs, or squamous epithelial cells) , No PMNs seen       No organisms seen     Bacterial Culture, Sterile Sites No growth 11 days.          Radiology Results  Discussed in A/P    I discussed the patient with Marion Downer, MD from [facility] regarding A&P.    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  2011: VRd bortezomib, Lenalidomide, Dexamethasone  04/2011: Auto SCT  07/2014: Rd - revlimid 15 mg every other day, dexamethasone 8 mg PO weekly.   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   Receiving dexamethasone 12 mg weekly for diarrhea as outpt  10/16/21 - last Daratumumab dose (on hold due to diarrhea)     DATA:   09/07/2010: IgG Kappa MM  12/02/2021: Ig M <5, IgG 447, IgA 20, Ig E <2, SFLC Kappa 16.6, Lambda 1.6, Kappa/Lamda 10.38  12/14/21: IgG K on IFE, IgG 430, KLC 16.6, LLC 2.7, KLR 6.15  02/23/22 SPEP with stable M-spike, SFLC decreased Kappa 44.2 / Lambda 5.5 = K/L ratio = 8.04 (overall downtrending still)  04/23/22 M-spike 0.6, IgG  K on IFE, IgG 997, Kappa 70.7/Lambda 19.8 = K/L ratio = 3.57  Monitor MM labs monthly per Dr Jacelyn Grip  05/23/22 MM labs PENDING     Chemo:  TBD, therapy held since 10/2021 due to recurrent infections and now deconditioning  Not currently a candidate due to significant deconditioning, but functional status improving and may become a candidate in the future with ongoing rehab.     Supportive Care:   Transfuse for Hgb <7 and Platelets <10  Access: PIV     Primary Oncologist:   Dr. Jacelyn Grip ([place]), will transfer to Dr. Hassell Done ([place])  Dr. Iona Coach ([place])     Disposition:  Lives in [place] - anticipate discharge to [facility]   -Peer to  Peer 7/7 with favorable outcome, however patient with already enacted appeal, which will have to be re-appealed - CM following  '[ ]'$  will need paracentesis q7-10 days coordinated outpatient with IR and transportation arranged with family      # Immunocompromised  # Diarrhea - controlled  # c/b CMV Viremia  # Hx prolonged norovirus infection  Immunocompromised due to underlying  malignancy, not currently neutropenic. Recent extended hospitalization at both [facility] 1/23-04/16/22, with hypovolemic shock associated with norovirus, CMV colitis, acute liver failure associated with pomylast (see ALF section).  Currently admitted for ongoing diarrhea and FTT in brief time at home after dc from [facility], initially began in 8/22, recently managed at home w/ loperamide BID w/ 2-3 watery stools daily. Diarrhea worsened on 6/12 (11 stools). Possibly r/t to inadequate dosing of loperamide vs infectious etiology. GI c/s 6/13 for possible scope - per patient and family declined endoscopy at this time. ID consultation deferred as infectious w/u negative.   Given CMV improving, norovirus negative, and enteric panel unrevealing - favor non-infectious etiologies for diarrhea. ID has requested we maintain enteric precautions for hospitalization d/t prolonged prior GI infections.   Diarrhea ~4 stools/day as of 7/1, improved to 1 soft/formed stool/d on 7/6.    Data  - Norovirus  - First +norovirus 08/2021 with persistent +norovirus s/p 7-day courses x 2 nitazoxanide (2/16-02/22/23 & 01/2022). Continued to be norovirus positive (most recently on 04/07/22). Norovirus, cdiff, GI PCR neg this admission (6/9).   - CMV  -Regarding CMV colitis, 12/17/21 flex sig showed CMV colitis, sp multiple cycles of gancyclovir IV, IVIG, valcyte 900 mg PO BID, then ultimately transitioned to letermovir, which she continues. 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 Acute rise in 11/2021,coinciding with acute CMV viremia but liver biopsy CMV stains negative (12/11/21). 12/14/21: Hepatitis/viral work-up negative in 11/2021. LP was done on 12/16/21 for hepatic encephalopathy w/ CMV CSF PCR negative. Suspected  to be 2/2 pomalidamide, though liver path from 12/22/21 unlikely for vanishing bile duct syndrome.   Upon admission was more somnolent and one episode of possible visual hallucination reported on 6/23. Her mentation has improved throughout this hospitalization. A&Ox3 as of 7/6.   C/s to Hepatology (7/2) regarding optimizing medical management and improving ascites: ascites felt to be primarily 2/2 malnutrition (see below) and some element of portal HTN. Goal will be to reduce paracentesis need to 1x/month by optimizing diuresis and improving nutritional status. Appreciate recs. Serum Na stable at 134, asymptomatic.     Dx:  - 6/17 KUB: mild ileus and density over abdomen (likely ascitic fluid) - not clinically significant as patient is having regular bowel movements and no PVR noted.  - 6/20 therapeutic paracentesis by TAPS, -4L + albumin  - 6/23 ammonia normal  - 6/27 paracentesis by TAPS -5L + albumin, SBP unrevealing or infection. cytology negative for malignancy  - 7/2 RUQ u/s w/ doppler: Mild liver surface nodularity c/w early cirrhosis and/or fibrosis.  Patent vasculature with normal direction of flow. Sequela of portal hypertension including increased now moderate ascites and splenomegaly. Cholelithiasis.  - 7/7 s/p paracentesis by TAPS - 3.5 L fluid removed - CCD, gram stain unrevealing for SBP    Plan:  -Appreciate hepatology recs:  -Continue spironolactone 150 mg + furosemide 60 mg to minimize accumulation of ascites - consider reducing spironolactone if sodium worsens <130 on 7/8 - (started 6/16, increased 6/19-, last incr 7/3- )  -Hold for SBP<90 or MAP<60  -Monitor I/Os & serum Na  -goal of optimizing so that paracentesis requirement is improved to 1x/month  -Currently requiring paracentesis q7-10d - will need to be arranged for d/c   -Follow up Alpha 1 antitrypsin stool study sent 7/4 '[ ]'$  - rule out malabsorption disorder   -Continue Ursodiol 600 mg PO BID  -Continue rifaximin 550 mg PO BID  -Low sodium diet - challenging in context of tube feeds  -2L fluid restriction  -Delirium precautions  -[place] surveillance next in 11/2022     # Failure to thrive  # PEG tube dependence  # Macrocytic anemia - B12 deficiency  PEG tube placed during last admission. Low B12 level but MMA normal, which is not consistent with B12 deficiency, however still treating at this time as below. Encouraged PO intake in addition to tube feeds. Transitioned to cyclic tube feeds on 0/27.  6/29 - tolerating cyclic TF and improving oral intake  7/3 - tolerating more oral intake, including solids  7/6 - continued improvement in oral intake, including ~50% of lunch on 7/5    Plan:  -Continue cyclic TF - Nutren 1.5 @ 75 mL x 16 hours (1800-1000) - TID residuals per RN protocol  -RD following -  -Continue Thera-M Plus MVI for copper deficiency (Copper = 47 on 6/16)  -Continue mirtazapine '30mg'$  nightly (incr 6/18- ) per palliative recs  -Continue cyanocobalamin 1041mg SQ weekly (6/24- )   -s/p Cyanocobalamin 10040m SQ daily x7 days (6/17-6/23)  -s/p Zinc acetate 6/11-6/23)    #  Dysphagia  Dysphagia and aspiration risk assessed by speech language pathology. SLP recommended Modified Barium Swallow (completed on 6/21) which showed moderate pharyngeal aspiration; SLP recommends regular diet w/ mildly thickened liquids.   6/22 - Pt and daughter request to thicken liquids themselves at bedside for more variety, understand risk of aspiration w/ thins. Liberalized diet per their request to regular.   Appreciate SLP recs.   - See failure to thrive  section as well re: PEG tube and nutritional deficiencies     # Pain  # Pressure Injury (sacrum)  Pain primarily r/t pressure injuries though occasionally also d/t abdominal cramping. Wound healing likely limited d/t poor nutritional status (see above) and limited mobility (improving).  -Consulted Palliative Care 6/17 for SMS, now s/o  -Continue hydrocodone-acetaminophen PRN  -Continue hydromorphone per G tube PRN     # Physical Deconditioning  Pt motivated to participate w/ PT/OT, deconditioned after prior extended hospitalization (as above). Showing marked improvements in anterioir weight shifting in WC, endurance w/ static siting balance & bed mobility. Still limited by global weakness, impaired balance, decreased endurance and fatigue.  - Continue PT/OT   - SNF placement recommended    # GOC  Pt is consistent and clear that she wishes to pursue full medical management of her ongoing medical problems, as above, with the goal of prolonging her life. She is motivated by her desire to spend time with her family. This is consistent in conversations with [facility] (6/18) & Dr. Jacelyn Grip (6/21).     VTE PPx:  Contraindicated: Anticipated procedure    Severity of Illness  Stable    Nutritional Assessment  Severe Chronic disease or condition related malnutrition    During this hospitalization the patient is also being treated for:  - Anemia associated with malignancy (present on admission)    - Thrombocytopenia (present on admission)    - Coagulopathy (present on  admission)    - Malignancy associated fatigue on admission  - Neoplasm/malignancy associated pain  - Hypoalbuminemia (present on admission)  - HYPOnatremia (present on admission)  - Fluid status: Volume overload on admission  - Cachexia  - Acute liver failure, present on admission    Code Status: FULL          I spent a total time of 80 minutes in this episode of care preparing to see the patient, obtaining and/or reviewing separately obtained history, performing a medically appropriate examination and/or evaluation, counseling and educating patient/family/caregiver, ordering and reviewing medications, tests, or procedures, referring and communicating with other health care professionals, documenting clinical information in the electronic or other health record, and communicating results to the patient/family/caregiver, and/or other care coordination.    Deboraha Sprang, MD

## 2022-05-24 MED FILL — SIMETHICONE 40 MG/0.6 ML ORAL DROPS,SUSPENSION: 40 mg/0.6 mL | ORAL | Qty: 2.4

## 2022-05-24 MED FILL — ANTI-DIARRHEAL (LOPERAMIDE) 2 MG TABLET: 2 mg | ORAL | Qty: 2

## 2022-05-24 MED FILL — PREVYMIS 480 MG TABLET: 480 mg | ORAL | Qty: 1

## 2022-05-24 MED FILL — XIFAXAN 550 MG TABLET: 550 mg | ORAL | Qty: 1

## 2022-05-24 MED FILL — SPIRONOLACTONE 25 MG TABLET: 25 mg | ORAL | Qty: 2

## 2022-05-24 MED FILL — FOLIC ACID 1 MG TABLET: 1 mg | ORAL | Qty: 1

## 2022-05-24 MED FILL — THIAMINE MONONITRATE (VITAMIN B1) 100 MG TABLET: 100 mg | ORAL | Qty: 1

## 2022-05-24 MED FILL — URSODIOL (BULK) 100 % POWDER: 100 % | Qty: 0.6

## 2022-05-24 MED FILL — ONDANSETRON HCL 4 MG TABLET: 4 mg | ORAL | Qty: 2

## 2022-05-24 MED FILL — FUROSEMIDE 20 MG TABLET: 20 mg | ORAL | Qty: 3

## 2022-05-24 MED FILL — THERA M PLUS (FERROUS FUMARATE) 9 MG IRON-400 MCG TABLET: 9 mg iron-400 mcg | ORAL | Qty: 1

## 2022-05-24 MED FILL — HYDROXYZINE HCL 25 MG TABLET: 25 mg | ORAL | Qty: 1

## 2022-05-24 MED FILL — HYDROCODONE 5 MG-ACETAMINOPHEN 325 MG TABLET: 5-325 mg | ORAL | Qty: 1

## 2022-05-24 MED FILL — LANSOPRAZOLE 3 MG/ML ORAL SUSPENSION COMPOUNDING KIT: 3 mg/mL | ORAL | Qty: 10

## 2022-05-24 MED FILL — CHOLECALCIFEROL (VITAMIN D3) 25 MCG (1,000 UNIT) TABLET: 1000 UNITS | ORAL | Qty: 2

## 2022-05-24 MED FILL — MIRTAZAPINE 30 MG TABLET: 30 mg | ORAL | Qty: 1

## 2022-05-24 NOTE — Plan of Care (Signed)
Problem: Fall, at Risk or Actual - Adult  Goal: Absence of falls and fall related injury  Outcome: Progress within 48 hours     Problem: Delirium - Adult / Pediatric  Goal: Absence or resolution of delirium  Outcome: Progress within 48 hours     Problem: Nutrition, Alteration in -Adult  Goal: Adequate nutritional intake  Outcome: Progress within 48 hours  Goal: Maximize nutritional intake per patient condition  Outcome: Progress within 48 hours     Problem: GI Elimination Impaired - Adult  Goal: Passage of soft, formed stool  Outcome: Progress within 48 hours  Goal: Stool elimination per clinical condition ( e.g. GI ostomies )  Outcome: Progress within 48 hours     Problem: Pressure Injury,At Risk and Actual- Adult  Goal: Absence of any new pressure injury  Outcome: Progress within 48 hours  Goal: Pressure injury healing/stabilization  Outcome: Progress within 48 hours  Goal: Participation in preventative efforts and treatment plan (Patient/Family/Caregiver)  Outcome: Progress within 48 hours  Goal: Adequate nutritional intake  Outcome: Progress within 48 hours

## 2022-05-24 NOTE — Consults (Signed)
Treatment Note    Interval History  Interval History  Interval History: Pt. reported increased intake for regular solid. Daughter noted pt. is eating up to 1/2 sandwich a day. Introduced compensatory strategy of effortful swallow. 6/21 MBSS completed and yielding appearance of mild-moderate oropharyngeal dysphagia impacting both safety and efficiency. Routine trace aspiration on thins during the swallow, sensate ~ 50% of the time w/ poor clearance of aspirated material w/ reflexive cough; cued volitional cough effective at clearing majority of aspirated material. Mild-moderate diffuse pharyngeal residue (increased viscosities > thin) improved w/ f/u swallow and liquid wash. Although not noted during exam, concern for potential aspiration on residue after the swallow.  Language Assistant  Interpreter Needed?: No    Subjective  Subjective  Patient subjective: Received pt. sitting upright in bed about to begin lunch. Daughter and son were at bed side upon arrival.     Objective  Short term goal(s) progress: Dysphagia f/u tx. Pt and daughter endorsed increased PO for regular solids. At first, pt. declined to conduct DYS tx w/ expressing wanting to eat lunch but was cooperative to tx upon SLP education. Reviewed compensatory swallow strategies (3-5 swallows, alternating solids/liquids, prophylactic cough + swallow) prior and during PO. Pt. performed self-facilitated PO trials for regular solids (melon + sandwich) and mildly thickened liquids (orange juice.). Pt w/ delayed dry cough/throat clear on 2/20.  Pt. expressed sandwich was tasteless. SLP team reviewed previous order allowing family to bring food from outside to optimize PO.    Assessment  Assessment  Impressions: Per 6/21 MBSS findings, pt. appears to continue to have mild-moderate oropharyngeal dysphagia suspected 2/2 to deconditioning c/b possible rheological impact c/w xerostomia exacerbated by cognitive decline. Continue diet of regular solids w/mildly  thickened liquids; pills via PEG; thin (unthickened) water allowed outside of meal after oral care. Reviewed with daughter about allowing food from outside hospital to increase PO. Continue to practice strategies of effortful swallow, small bites/sips, 3-5 swallows per bite/sip, slow pacing, + cued cough & re-swallow, oral care prior to PO, upright 30 minutes following PO and after oral care.  SLP will follow up for additional tx later this week + OP SLP warranted upon DC. Recommending serial MBSS 1-2 months post 6/21 MBSS.  Current Functional Level  Domain: Swallowing  Swallowing: <Level 4>  Mild-moderate dysphagia: Intermittent supervision/cueing, one or two consistencies restricted  Rehab Potential  Rehab potential judged to be: Good  Facilitators: Motivation;Family/community support;Insight into deficits  Barriers: Co-morbidities    Recommendations  Swallowing Recommendations  Solids: Regular  Liquids: Level 0 (Thin);Level 2 (Mildly thick)  Liquids recommendation comment(s): Daughter prefers to thicken liquids with personal powder for consistency. When daughter is not pt., consent provided to provide with mildly thick liquids provided by hospital.  Liquid administration: Cup;Spoon  Medication administration: Non-oral  Supervision during meals: 1:1 assistance  Mealtime strategies and positioning: Upright;Remain upright 30 minutes after eating;Alternate solids and liquids;Small bites;Small single sips;Slow pace  Meal strategy comment(s): take small bites, 3-5 swallows per bite/sip, and practice oral care prior to all PO, prophylatic cough                Plans and Goals  Plan: Continue per outlined plan of care  Frequency: 2x/week    Discharge Recommendations: Outpatient SLP;Placement for continued intensive therapy upon discharge with PT/OT input       Updated Short Term Goals    05/06/22 1316 05/24/22 1427   Short Term Goals- Updated: The patient/family/caregiver will identify and demonstrate compensatory swallow  strategies and/or precautions to improve safe and efficient swallowing with 100% accuracy The patient/family/caregiver will identify and demonstrate compensatory swallow strategies and/or precautions to improve safe and efficient swallowing with 100% accuracy;The patient will safely and efficiently ingest 100% of therapeutic trials to determine appropriate diet, at the discretion of the SLP       [facility]  05/24/2022

## 2022-05-24 NOTE — Progress Notes (Signed)
[facility] PROGRESS NOTE     This is an independent service.   The available consultant for this service is Alena Bills, MD.           24 Hour Course  -HDS, afebrile  -Awaiting results from appeal to [employer] by patient's daughter regarding SNF [employer]emnet    Subjective  No abdominal pain. Eating and drinking ok.  Reassured that her hip pain is not related to MM findings.  CXR negative    Vitals  Temp:  [36.8 C (98.2 F)-37.3 C (99.1 F)] 37.3 C (99.1 F)  Heart Rate:  [73-87] 87  *Resp:  [16] 16  BP: (90-110)/(57-74) 106/74  SpO2:  [98 %-99 %] 99 %    MostRecent Weight: 67.4 kg (148 lb 9.4 oz)  Admission Weight: 66.2 kg (146 lb)      Intake/Output Summary (Last 24 hours) at 05/24/2022 1636  Last data filed at 05/24/2022 1107  Gross per 24 hour   Intake 150 ml   Output 550 ml   Net -400 ml       Physical Exam  Gen: NAD, appropriate and engaged  HEENT: MMM, no oral lesions, sclera non-icteric  Resp: CTAB, no rhonchi/wheezing  Card: RRR  Gi: distended, firm, non-tender, +bs x4  Ext: negative BLE edema   Skin: scattered ecchymosis to BUE  Neuro: Alert, oriented x3, no focal neuro deficits  Lines: PIV x1 in place, clean dry & intact dressing    Scheduled Meds:   0.9% sodium chloride flush  3 mL Intravenous Q12H [place]    cholecalciferol (vitamin D3)  2,000 Units Per G Tube Daily [place]    cyanocobalamin (Vitamin B12)  1,000 mcg Subcutaneous Q7 Days    folic acid  1 mg Per G Tube Daily [place]    furosemide  60 mg Per G Tube Daily [place]    lansoprazole  30 mg Per G Tube Q AM Before Breakfast [facility]    letermovir  480 mg Other Daily [place]    loperamide  4 mg Feeding Tube Q8H    mirtazapine  30 mg Per G Tube Daily At Bedtime [facility]    multivitamin with iron-minerals-folic acid  1 tablet Feeding Tube Daily SCH    ondansetron  8 mg Per G Tube TID    rifAXIMin  550 mg Feeding Tube BID SCH    simethicone  160 mg Oral 4x Daily [place]    spironolactone  150 mg Per G Tube Daily [place]    thiamine mononitrate (vit B1)  100 mg Per G Tube  Daily [place]    ursodiol  600 mg Per G Tube BID [place]     Continuous Infusions:  PRN Meds:   0.9% sodium chloride flush  3 mL Intravenous PRN    acetaminophen  650 mg Per G Tube Q8H PRN    albuterol  2.5 mg Nebulization Q4H PRN    Or    albuterol  2.5 mg Nebulization Q4H PRN    aluminum-magnesium hydroxide-simethicone  15 mL Per G Tube Q4H PRN    guaiFENesin  200 mg Oral Q4H PRN    HYDROcodone-acetaminophen  1-2 tablet Per G Tube Q6H PRN    HYDROmorphone  1 mg Oral Q4H PRN    hydrOXYzine  25 mg Feeding Tube Q4H PRN    opium  6 mg Oral 4x Daily PRN    prochlorperazine  5 mg Intravenous Q8H PRN    Or    prochlorperazine  5 mg Per G  Tube Q8H PRN     Data  CBC        05/24/22  0537 05/23/22  0547   WBC 5.7 6.1   HGB 7.9* 8.8*   HCT 25.8* 27.8*   PLT 95* 150     Coags        05/24/22  0537   INR 1.2       Chem7        05/24/22  0537 05/23/22  0547   NA 132* 134*   K 4.9 4.4   CL 100* 101   CO2 28 28   BUN 22 21   CREAT 0.64 0.60   GLU 120 110     Electrolytes        05/24/22  0537 05/23/22  0547   CA 8.0* 8.0*   MG 1.8 2.0   PO4 3.1 3.1     Liver Panel        05/24/22  0537 05/23/22  0547   AST 62* 49*   ALT 21 20   ALKP 183* 182*   TBILI 0.4 0.4   ALB 1.8*  --      UA  No results found in last 7 days    Microscopy:  No results found in last 7 days    Microbiology Results (last 72 hours)       Procedure Component Value Units Date/Time    Bacterial Culture, Normally Sterile Sites, With Gram Stain [[id number]] Collected: 05/11/22 1115    Order Status: Completed Specimen: Not Applicable from Peritoneal Fluid Updated: 05/24/22 1456     Gram Stain Rare Host cells (not PMNs, RBCs, or squamous epithelial cells) , No PMNs seen      No organisms seen     Bacterial Culture, Sterile Sites No growth 13 days.    Bacterial Culture, Normally Sterile Sites, With Gram Stain [[id number]] Collected: 05/21/22 1141    Order Status: Completed Specimen: Not Applicable from Peritoneal Fluid Updated: 05/24/22 0848     Gram Stain Rare RBCs , Few Host  cells (not PMNs, RBCs, or squamous epithelial cells) , No PMNs seen      No organisms seen     Bacterial Culture, Sterile Sites No growth 3 days.          Radiology Results  Discussed in A/P    I discussed the patient with Johnney Ou* from [facility] regarding A&P.    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  2011: VRd bortezomib, Lenalidomide, Dexamethasone  04/2011: Auto SCT  07/2014: Rd - revlimid 15 mg every other day, dexamethasone 8 mg PO weekly.   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   Receiving dexamethasone 12 mg weekly for diarrhea as outpt  10/16/21 - last Daratumumab dose (on hold due to diarrhea)     DATA:   09/07/2010: IgG Kappa MM  12/02/2021: Ig M <5, IgG 447, IgA 20, Ig E <2, SFLC Kappa 16.6, Lambda 1.6, Kappa/Lamda 10.38  12/14/21: IgG K on IFE, IgG 430, KLC 16.6, LLC 2.7, KLR 6.15  02/23/22 SPEP with  stable M-spike, SFLC decreased Kappa 44.2 / Lambda 5.5 = K/L ratio = 8.04 (overall downtrending still)  04/23/22 M-spike 0.6, IgG K on IFE, IgG 997, Kappa 70.7/Lambda 19.8 = K/L ratio = 3.57  Monitor MM labs monthly per Dr Jacelyn Grip  05/23/22 [place] 82.9, LLC 24.4, KLC 3.4     Chemo:  TBD, therapy held since 10/2021 due to recurrent infections and now deconditioning  Not currently a candidate due to significant deconditioning, but functional status improving and may become a candidate in the future with ongoing rehab.     Supportive Care:   Transfuse for Hgb <7 and Platelets <10  Access: PIV     Primary Oncologist:   Dr. Jacelyn Grip ([place]), will transfer to Dr. Hassell Done ([place])  Dr. Iona Coach ([place])     Disposition:  Lives  in [place] - anticipate discharge to [place]   -Peer to Peer 7/7 with favorable outcome, however patient with already enacted appeal, which will have to be re-appealed - CM following  '[ ]'$  will need paracentesis q7-10 days coordinated outpatient with IR and transportation arranged with family      # Immunocompromised  # Diarrhea - controlled  # c/b CMV Viremia  # Hx prolonged norovirus infection  Immunocompromised due to underlying  malignancy, not currently neutropenic. Recent extended hospitalization at both [place] 1/23-04/16/22, with hypovolemic shock associated with norovirus, CMV colitis, acute liver failure associated with pomylast (see ALF section).  Currently admitted for ongoing diarrhea and FTT in brief time at home after dc from [facility], initially began in 8/22, recently managed at home w/ loperamide BID w/ 2-3 watery stools daily. Diarrhea worsened on 6/12 (11 stools). Possibly r/t to inadequate dosing of loperamide vs infectious etiology. GI c/s 6/13 for possible scope - per patient and family declined endoscopy at this time. ID consultation deferred as infectious w/u negative.   Given CMV improving, norovirus negative, and enteric panel unrevealing - favor non-infectious etiologies for diarrhea. ID has requested we maintain enteric precautions for hospitalization d/t prolonged prior GI infections.   Diarrhea ~4 stools/day as of 7/1, improved to 1 soft/formed stool/d on 7/6.    Data  - Norovirus  - First +norovirus 08/2021 with persistent +norovirus s/p 7-day courses x 2 nitazoxanide (2/16-02/22/23 & 01/2022). Continued to be norovirus positive (most recently on 04/07/22). Norovirus, cdiff, GI PCR neg this admission (6/9).   - CMV  -Regarding CMV colitis, 12/17/21 flex sig showed CMV colitis, sp multiple cycles of gancyclovir IV, IVIG, valcyte 900 mg PO BID, then ultimately transitioned to letermovir, which she continues. 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 Acute rise in 11/2021,coinciding with acute CMV viremia but liver  biopsy CMV stains negative (12/11/21). 12/14/21: Hepatitis/viral work-up negative in 11/2021. LP was done on 12/16/21 for hepatic encephalopathy w/ CMV CSF PCR negative. Suspected to be 2/2 pomalidamide, though liver path from 12/22/21 unlikely for vanishing bile duct syndrome.   Upon admission was more somnolent and one episode of possible visual hallucination reported on 6/23. Her mentation has improved throughout this hospitalization. A&Ox3 as of 7/6.   C/s to Hepatology (7/2) regarding optimizing medical management and improving ascites: ascites felt to be primarily 2/2 malnutrition (see below) and some element of portal HTN. Goal will be to reduce paracentesis need to 1x/month by optimizing diuresis and improving nutritional status. Appreciate recs. Serum Na stable at 134, asymptomatic.     Dx:  - 6/17 KUB: mild ileus and density over abdomen (likely ascitic fluid) - not clinically significant as patient is having regular bowel movements and no PVR noted.  - 6/20 therapeutic paracentesis by TAPS, -4L + albumin  - 6/23 ammonia normal  - 6/27 paracentesis by TAPS -5L + albumin, SBP unrevealing or infection. cytology negative for malignancy  - 7/2 RUQ u/s  w/ doppler: Mild liver surface nodularity c/w early cirrhosis and/or fibrosis. Patent vasculature with normal direction of flow. Sequela of portal hypertension including increased now moderate ascites and splenomegaly. Cholelithiasis.  - 7/7 s/p paracentesis by TAPS - 3.5 L fluid removed - CCD, gram stain unrevealing for SBP    Plan:  -Appreciate hepatology recs:  -Continue spironolactone 150 mg + furosemide 60 mg to minimize accumulation of ascites - consider reducing spironolactone if sodium worsens <130 on 7/8 - (started 6/16, increased 6/19-, last incr 7/3- )  -Hold for SBP<90 or MAP<60  -Monitor I/Os & serum Na  -goal of optimizing so that paracentesis requirement is improved to 1x/month  -Currently requiring paracentesis q7-10d - will need to be arranged for d/c   -Follow up Alpha 1 antitrypsin stool study sent 7/4 '[ ]'$  - rule out malabsorption disorder   -Continue Ursodiol 600 mg PO BID  -Continue rifaximin 550 mg PO BID  -Low sodium diet - challenging in context of tube feeds  -2L fluid restriction  -Delirium precautions  -Moffat surveillance next in 11/2022     # Failure to thrive  # PEG tube dependence  # Macrocytic anemia - B12 deficiency  PEG tube placed during last admission. Low B12 level but MMA normal, which is not consistent with B12 deficiency, however still treating at this time as below. Encouraged PO intake in addition to tube feeds. Transitioned to cyclic tube feeds on 8/12.  6/29 - tolerating cyclic TF and improving oral intake  7/3 - tolerating more oral intake, including solids  7/6 - continued improvement in oral intake, including ~50% of lunch on 7/5    Plan:  -Continue cyclic TF - Nutren 1.5 @ 75 mL x 16 hours (1800-1000) - TID residuals per RN protocol  -RD following -  -Continue Thera-M Plus MVI for copper deficiency (Copper = 47 on 6/16)  -Continue mirtazapine '30mg'$  nightly (incr 6/18- ) per palliative recs  -Continue cyanocobalamin 1050mg SQ weekly (6/24- )   -s/p Cyanocobalamin 10053m SQ  daily x7 days (6/17-6/23)  -s/p Zinc acetate 6/11-6/23)    # Dysphagia  Dysphagia and aspiration risk assessed by speech language pathology. SLP recommended Modified Barium Swallow (completed on 6/21) which showed moderate pharyngeal aspiration; SLP recommends regular diet w/ mildly thickened liquids.   6/22 - Pt and daughter request to thicken liquids themselves at bedside for more variety,  understand risk of aspiration w/ thins. Liberalized diet per their request to regular.   Appreciate SLP recs.   - See failure to thrive section as well re: PEG tube and nutritional deficiencies     # Pain  # Pressure Injury (sacrum)  Pain primarily r/t pressure injuries though occasionally also d/t abdominal cramping. Wound healing likely limited d/t poor nutritional status (see above) and limited mobility (improving).  -Consulted Palliative Care 6/17 for SMS, now s/o  -Continue hydrocodone-acetaminophen PRN  -Continue hydromorphone per G tube PRN     # Physical Deconditioning  Pt motivated to participate w/ PT/OT, deconditioned after prior extended hospitalization (as above). Showing marked improvements in anterioir weight shifting in WC, endurance w/ static siting balance & bed mobility. Still limited by global weakness, impaired balance, decreased endurance and fatigue.  - Continue PT/OT   - SNF placement recommended    # GOC  Pt is consistent and clear that she wishes to pursue full medical management of her ongoing medical problems, as above, with the goal of prolonging her life. She is motivated by her desire to spend time with her family. This is consistent in conversations with [facility] (6/18) & Dr. Jacelyn Grip (6/21).     VTE PPx:  Contraindicated: Anticipated procedure    Severity of Illness  Stable    Nutritional Assessment  Severe Chronic disease or condition related malnutrition    During this hospitalization the patient is also being treated for:  - Anemia associated with malignancy (present on admission)    -  Thrombocytopenia (present on admission)    - Coagulopathy (present on admission)    - Malignancy associated fatigue on admission  - Neoplasm/malignancy associated pain  - Hypoalbuminemia (present on admission)  - HYPOnatremia (present on admission)  - Fluid status: Volume overload on admission  - Cachexia  - Acute liver failure, present on admission    Code Status: FULL    I spent a total time of 80 minutes in this episode of care preparing to see the patient, obtaining and/or reviewing separately obtained history, performing a medically appropriate examination and/or evaluation, counseling and educating patient/family/caregiver, ordering and reviewing medications, tests, or procedures, referring and communicating with other health care professionals, documenting clinical information in the electronic or other health record, and communicating results to the patient/family/caregiver, and/or other care coordination.    Gardiner Rhyme, NP  Hospital Identification #:  [id number]

## 2022-05-24 NOTE — Consults (Signed)
OCCUPATIONAL THERAPY PROGRESS NOTE      Diagnosis and brief medical history: 25F MM, chronic diarrhea (CMV colitis vs norovirus infection), acute liver injury, admitted for persistent diarrhea and FTT    Assessment and Treatment Plan    OT Progress summary: Pt continues to be agreeable to OT session. Pt tolerated transfer to w/c well and demosntrated good engagement in seated ADLs in w/c. Pt limited by LE weakness and impaired trunk strength requiring max A for LB dressing and mod A for LB bathing. Pt making progress with reach and grasp at sink only min cueing to initiate for seated grooming. Pt requires max A overall but tolerated 55 min session. Recommend placement.  Progress with OT: Good progress  Current maximal level of assist: Maximal assist  Next session focus:      Recommendations    Recommended Discharge Disposition Placement for continued therapy   Discharge DME Recommendations Defer to next level of care;To be determined   Equipment Recommendations     DME Comment     Discharge Recommendations Comment     Rehab Potential Patient participates well in therapy and is progressing towards goal   Anticipated Assistance Available at Discharge Family;Spouse/Significant other   Anticipated Type of Assistance Available at Discharge Assist as needed   Anticipated Time of Assistance Available at Discharge Unknown at this time   Barriers to Discharge Medical issues   Patient's current functional ability appropriate for D/C recommendations Yes     Inpatient Recommendations  OT Inpatient Recommendations Bed in chair position;Encourage participation in ADLs;Delirium precautions;Out of bed for meals and ADLs;Frequent turns   Recommendation Comments Hoyer lift to high back chair or wheelchair   Current Maximal Level of Assist Needed Maximal assist     Brace/Precautions   Brace/Orthotic/Prosthetic:   Precautions:Has weight bearing limitation or precaution: Yes  Precautions and weight bearing status comments: Falls,  delirium, skin, aspiration, contact     Subjective Report:"I am scared you are going to make me do something I cant do"    Patient/Family Goal:     Objective Findings and Interventions    Areas of Occupation    Grooming and Light Hygiene Supervision/safety;Set up   Cueing Required     Comment min cueing to initiate, wash face, brush teeth   Self Feeding     Cueing Required     Comment     Toileting Minimal assistance   Cueing Required     Toileting Clothing Management Maximal assistance   Cueing Required     Comment min A  rolling and max A pericare   Upper Body Dressing Minimal assistance   Cueing Required     Comment don jacket and new gown. Able to doff jacket and gown   Lower Body Dressing Maximal assistance   Cueing Required     Comment unable to cross legs, perform figure 4 or reach feet   Upper Body Bathing Contact guard assistance   Cueing Required     Comment CHG wipes in w/c   Lower Body Bathing Moderate assistance   Cueing Required     Comment CHG wipe in w/c only able to reach mid shin area due to impaired anterior weight shifting   ADL/IADL Comment         Functional Transfers    Bed Mobility From Supine;Sidelying   Bed Mobility To Sidelying;Supine   Level of Assist Minimal assistance   Cueing Required     Transfer From Bed   Transfer To Wheelchair  Level of Assist Dependent   Cueing Required     Technique     Functional Transfer Comment Hoyer to w/c w/ 1p assist. Pt wheeled to sink, engaged in seated grooming. Pt performed seated bathing and dressing in w/c. 55 min session     Functional Balance for ADLs  Position Static Dynamic   Sit  -     Dynamic sitting level of assist: Contact guard assist-  Dynamic sitting comment: anterior weight shifting     Stand  -      -            [employer] [facility]	[place] AM-PAC for ADLs  6 Click Score: 16    Client Factors  Cognitive deficits noted in the following area(s):: Orientation, Attention, Memory  Cognition comment:     Organization;Information processing;Working  Chief of Staff functions comment:                                      Roma Schanz, OT  05/24/2022 12:00 PM

## 2022-05-25 LAB — COMPLETE BLOOD COUNT WITH DIFFERENTIAL
Abs Basophils: 0.01 10*9/L (ref 0.00–0.10)
Abs Eosinophils: 0.07 10*9/L (ref 0.00–0.40)
Abs Imm Granulocytes: 0.08 10*9/L (ref <0.10)
Abs Lymphocytes: 0.62 10*9/L — ABNORMAL LOW (ref 1.00–3.40)
Abs Monocytes: 0.54 10*9/L (ref 0.20–0.80)
Abs Neutrophils: 3.71 10*9/L (ref 1.80–6.80)
Hematocrit: 26.8 % — ABNORMAL LOW (ref 36.0–46.0)
Hemoglobin: 8.3 g/dL — ABNORMAL LOW (ref 12.0–15.5)
MCH: 30.7 pg (ref 26.0–34.0)
MCHC: 31 g/dL (ref 31.0–36.0)
MCV: 99 fL (ref 80–100)
MPV: 12 fL (ref 9.1–12.6)
Platelet Count: 137 10*9/L — ABNORMAL LOW (ref 140–450)
RBC Count: 2.7 10*12/L — ABNORMAL LOW (ref 4.00–5.20)
RDW-CV: 16.8 % — ABNORMAL HIGH (ref 11.7–14.4)
WBC Count: 5 10*9/L (ref 3.4–10.0)

## 2022-05-25 LAB — PHOSPHORUS, SERUM / PLASMA: Phosphorus, Serum / Plasma: 3.6 mg/dL (ref 2.3–4.7)

## 2022-05-25 LAB — BASIC METABOLIC PANEL (NA, K, CL, CO2, BUN, CR, GLU, CA)
Anion Gap: 6 (ref 4–14)
Calcium, total, Serum / Plasma: 8 mg/dL — ABNORMAL LOW (ref 8.4–10.5)
Carbon Dioxide, Total: 25 mmol/L (ref 22–29)
Chloride, Serum / Plasma: 100 mmol/L — ABNORMAL LOW (ref 101–110)
Creatinine: 0.64 mg/dL (ref 0.55–1.02)
Glucose, non-fasting: 118 mg/dL (ref 70–199)
Potassium, Serum / Plasma: 4.3 mmol/L (ref 3.5–5.0)
Sodium, Serum / Plasma: 131 mmol/L — ABNORMAL LOW (ref 135–145)
Urea Nitrogen, Serum / Plasma: 21 mg/dL (ref 7–25)
eGFRcr: 100 mL/min/{1.73_m2} (ref >59)

## 2022-05-25 LAB — ALKALINE PHOSPHATASE: Alkaline Phosphatase: 176 U/L — ABNORMAL HIGH (ref 38–108)

## 2022-05-25 LAB — ASPARTATE TRANSAMINASE: AST: 48 U/L — ABNORMAL HIGH (ref 5–44)

## 2022-05-25 LAB — BACTERIAL CULTURE, STERILE SITES, WITH GRAM STAIN
Bacterial Culture, Sterile Sites: NO GROWTH
Gram Stain: NONE SEEN

## 2022-05-25 LAB — ALANINE TRANSAMINASE: Alanine transaminase: 19 U/L (ref 10–61)

## 2022-05-25 LAB — MAGNESIUM, SERUM / PLASMA: Magnesium, Serum / Plasma: 1.8 mg/dL (ref 1.6–2.6)

## 2022-05-25 LAB — BILIRUBIN, TOTAL: Bilirubin, Total: 0.4 mg/dL (ref 0.2–1.2)

## 2022-05-25 MED FILL — HYDROXYZINE HCL 25 MG TABLET: 25 mg | ORAL | Qty: 1

## 2022-05-25 MED FILL — ANTI-DIARRHEAL (LOPERAMIDE) 2 MG TABLET: 2 mg | ORAL | Qty: 2

## 2022-05-25 MED FILL — SIMETHICONE 40 MG/0.6 ML ORAL DROPS,SUSPENSION: 40 mg/0.6 mL | ORAL | Qty: 2.4

## 2022-05-25 MED FILL — URSODIOL (BULK) 100 % POWDER: 100 % | Qty: 0.6

## 2022-05-25 MED FILL — LANSOPRAZOLE 3 MG/ML ORAL SUSPENSION COMPOUNDING KIT: 3 mg/mL | ORAL | Qty: 10

## 2022-05-25 MED FILL — CHOLECALCIFEROL (VITAMIN D3) 25 MCG (1,000 UNIT) TABLET: 1000 UNITS | ORAL | Qty: 2

## 2022-05-25 MED FILL — ACETAMINOPHEN 325 MG TABLET: 325 mg | ORAL | Qty: 2

## 2022-05-25 MED FILL — ONDANSETRON HCL 4 MG TABLET: 4 mg | ORAL | Qty: 2

## 2022-05-25 MED FILL — MIRTAZAPINE 30 MG TABLET: 30 mg | ORAL | Qty: 1

## 2022-05-25 MED FILL — THIAMINE MONONITRATE (VITAMIN B1) 100 MG TABLET: 100 mg | ORAL | Qty: 1

## 2022-05-25 MED FILL — XIFAXAN 550 MG TABLET: 550 mg | ORAL | Qty: 1

## 2022-05-25 MED FILL — SPIRONOLACTONE 25 MG TABLET: 25 mg | ORAL | Qty: 2

## 2022-05-25 MED FILL — THERA M PLUS (FERROUS FUMARATE) 9 MG IRON-400 MCG TABLET: 9 mg iron-400 mcg | ORAL | Qty: 1

## 2022-05-25 MED FILL — FUROSEMIDE 20 MG TABLET: 20 mg | ORAL | Qty: 3

## 2022-05-25 MED FILL — PREVYMIS 480 MG TABLET: 480 mg | ORAL | Qty: 1

## 2022-05-25 MED FILL — FOLIC ACID 1 MG TABLET: 1 mg | ORAL | Qty: 1

## 2022-05-25 NOTE — Consults (Signed)
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 Pt continues to demonstrate good engagement, participation and functional progress with OT. Pt tolerated 25 minutes EOB with 2x posterior rest breaks focusing on sitting balance and trunk control in order to progress toilet transfers. Pt limited by onset of urgent BM requiring mod A for bed pan placement unable to tolerate hoyer transfer to [facility] due to urgency. Pt continues to require max A for ADLs but making progress with static sitting balance and dynamic sitting balance. Plan to progress squat pivot transfers next session. Recommend placement.    Recommendations    Recommended Discharge Disposition Placement for continued therapy       Discharge DME Recommendations Defer to next level of care;To be determined   Equipment Recommendations     DME Comment     Discharge Recommendations Comment     Rehab Potential Able to tolerate 3 hours of therapy a day;Patient participates well in therapy and is progressing towards goal   Anticipated Assistance Available at Discharge Family;Spouse/Significant other   Anticipated Type of Assistance Available at Discharge Assist as needed   Anticipated Time of Assistance Available at Discharge Unknown at this time   Barriers to Discharge Medical issues   Patient's current functional ability appropriate for D/C recommendations Yes     Inpatient recommendations  OT Inpatient Recommendations Bed in chair position;Encourage participation in ADLs;Delirium precautions;Out of bed for meals and ADLs;Frequent turns   Recommendation Comments Hoyer lift to high back chair or wheelchair   Current Maximal Level of Assist Needed Maximal assist     Prior Level of  Function  Prior living environment comments:Per chart review, lives in two-story home with children. 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  Fall history:No  Fall history details:   Additional comments:          Currently in pain  Currently in pain: No  Pain location: sacral and periarea pain  Pain scale: Wong-Baker  Pain description: pulling, pressure  Alleviators/aggravators/intervention: improves when offloaded in [place]      Brace/Precautions   Brace/Orthotic/Prosthetic:   Precautions:Has weight bearing limitation or precaution: Yes  Precautions and weight bearing status comments: Falls, delirium, skin, aspiration, contact     Subjective Report:"This is hard"    Patient/Family Goal:     Objective Findings and Interventions    Areas of Occupation    Grooming and Light Hygiene Contact guard assistance   Cueing Required     Comment min A sitting balance, brush teeth and wash face   Self Feeding Contact guard assistance   Cueing Required     Comment setup with almond milk drinking EOB   Toileting Moderate assistance   Cueing Required     Toileting Clothing Management Maximal assistance   Cueing Required     Comment mod A rolling for bed pan placement   Upper Body Dressing     Cueing Required     Comment     Lower Body Dressing  Cueing Required     Comment     Upper Body Bathing     Cueing Required     Comment     Lower Body Bathing     Cueing Required     Comment     ADL/IADL Comment         Functional Transfers    Bed Mobility From Supine   Bed Mobility To Sitting EOB   Level of Assist Moderate assistance   Cueing Required     Transfer From     Transfer To     Level of Assist     Cueing Required      Technique     Functional Transfer Comment Pt tolerated sitting EOB for 25 minutes with 2x posterior rest breaks with mod to min A. Pt required posterior support for dynamic sitting for ADLs. Pt instructed in lateral leaning min A on R and CG of L performed 4x each side. Pt limited by onset of BM deferring w/c.     Functional Balance for ADLs  Position Static Dynamic   Sit Static sitting level of assist: Contact guard assist-   Static sitting comment: EOB Dynamic sitting level of assist: Minimal assist-  Dynamic sitting comment: posterior support     Stand  -      -            [employer] [facility]	[place] AM-PAC for ADLs  6 Click Score: 17    Client Factors  Cognitive deficits noted in the following area(s):: Attention, Judgement/Safety, Memory  Cognition comment:     Organization;Information processing;Working [employer] ID  Executive functions comment:                                                     OT Radiation protection practitioner: Patient  Content: Plan of care;Activity recommendations;Discharge recommendations  Response: Needs reinforcement;Verbalizes understanding;Demonstrates understanding  Communication between other health care provider: RN    OT Plan of Care  Planned OT Interventions: Activities of Daily Living retraining;Energy conservation education;Functional transfer training;Assistive/Adaptive device training;Cognitive re-training;Pain Management;Patient/Family/Caregiver education;Therapeutic exercise training;Delirium management;Mental health and wellness  OT Prognosis for Goals: Good  OT Frequency: 5x/wk  OT Duration: 4;Weeks  Patient/Caregiver Agreeable to OT Plan of Care: Yes    OT Goals  Short Term Goal End Date: 06/25/22       Toileting  Pt will complete toileting activities: With minimal assistance  Toileting Comment: squat pivot  Upper Body Dressing  Pt will dress upper body: With supervision  Lower Body Dressing  Pt will dress lower body: With minimal assistance  Upper Body Bathing  Pt will  bathe upper body: With supervision  Lower Body Bathing  Pt will bathe lower body: With minimal assistance  Bed Mobility for ADLs  Bed Mobility for ADLs: With supervision  Functional Transfers  Chair Transfer: With minimal assistance  Chair Transfer Comment: FWW  Shower Transfer: With minimal assistance  Shower Transfer Comment: FWW  Toilet Transfer: With minimal assistance  Toilet Transfer Comment: FWW  Balance  Pt will maintain balance during: Activities of daily living, Functional transfers, Standing activities                  Roma Schanz, [place]  05/25/2022 1:09 PM

## 2022-05-25 NOTE — Plan of Care (Signed)
Problem: Fall, at Risk or Actual - Adult  Goal: Absence of falls and fall related injury  Outcome: Progress within 12 hours     Problem: Nutrition, Alteration in -Adult  Goal: Adequate nutritional intake  Outcome: Progress within 12 hours  Goal: Maximize nutritional intake per patient condition  Outcome: Progress within 12 hours     Problem: Pressure Injury,At Risk and Actual- Adult  Goal: Absence of any new pressure injury  Outcome: Progress within 12 hours  Goal: Pressure injury healing/stabilization  Outcome: Progress within 12 hours  Goal: Participation in preventative efforts and treatment plan (Patient/Family/Caregiver)  Outcome: Progress within 12 hours  Goal: Adequate nutritional intake  Outcome: Progress within 12 hours     Problem: GI Elimination Impaired - Adult  Goal: Passage of soft, formed stool  Outcome: Progress within 12 hours  Goal: Stool elimination per clinical condition ( e.g. GI ostomies )  Outcome: Progress within 12 hours

## 2022-05-25 NOTE — Plan of Care (Signed)
Problem: Discharge Planning - Adult  Goal: Knowledge of and participation in plan of care  Outcome: Progress within 12 hours     Problem: Fall, at Risk or Actual - Adult  Goal: Absence of falls and fall related injury  Outcome: Progress within 12 hours     Problem: Delirium - Adult / Pediatric  Goal: Absence or resolution of delirium  Outcome: Progress within 12 hours     Problem: Nutrition, Alteration in -Adult  Goal: Adequate nutritional intake  Outcome: Progress within 12 hours  Goal: Maximize nutritional intake per patient condition  Outcome: Progress within 12 hours     Problem: Pressure Injury,At Risk and Actual- Adult  Goal: Absence of any new pressure injury  Outcome: Progress within 12 hours  Goal: Pressure injury healing/stabilization  Outcome: Progress within 12 hours  Goal: Participation in preventative efforts and treatment plan (Patient/Family/Caregiver)  Outcome: Progress within 12 hours  Goal: Adequate nutritional intake  Outcome: Progress within 12 hours     Problem: GI Elimination Impaired - Adult  Goal: Passage of soft, formed stool  Outcome: Progress within 12 hours  Goal: Stool elimination per clinical condition ( e.g. GI ostomies )  Outcome: Progress within 12 hours

## 2022-05-25 NOTE — Progress Notes (Signed)
[facility] PROGRESS NOTE     This is an independent service.   The available consultant for this service is Alena Bills, MD.           24 Hour Course  -HDS, afebrile  -Awaiting results from appeal to [facility] by patient's daughter regarding SNF placement.  Patient is ready for discharge for PT/OT    Subjective  No abdominal pain. Eating and drinking ok.  No issues to report today.    Vitals  Temp:  [36.7 C (98.1 F)-37.7 C (99.9 F)] 37.7 C (99.9 F)  Heart Rate:  [74-80] 78  *Resp:  [14-18] 16  BP: (97-115)/(63-73) 101/71  SpO2:  [98 %-100 %] 100 %    MostRecent Weight: 67.4 kg (148 lb 9.4 oz)  Admission Weight: 66.2 kg (146 lb)      Intake/Output Summary (Last 24 hours) at 05/25/2022 1610  Last data filed at 05/25/2022 1429  Gross per 24 hour   Intake 1590 ml   Output 2000 ml   Net -410 ml       Physical Exam  Gen: NAD, appropriate and engaged  HEENT: MMM, no oral lesions, sclera non-icteric  Resp: CTAB, no rhonchi/wheezing  Card: RRR  Gi: distended, firm, non-tender, +bs x4  Ext: negative BLE edema   Skin: scattered ecchymosis to BUE  Neuro: Alert, oriented x3, no focal neuro deficits  Lines: PIV x1 in place, clean dry & intact dressing    Scheduled Meds:   0.9% sodium chloride flush  3 mL Intravenous Q12H [place]    cholecalciferol (vitamin D3)  2,000 Units Per G Tube Daily [place]    cyanocobalamin (Vitamin B12)  1,000 mcg Subcutaneous Q7 Days    folic acid  1 mg Per G Tube Daily [place]    furosemide  60 mg Per G Tube Daily [place]    lansoprazole  30 mg Per G Tube Q AM Before Breakfast [facility]    letermovir  480 mg Other Daily [place]    loperamide  4 mg Feeding Tube Q8H    mirtazapine  30 mg Per G Tube Daily At Bedtime [facility]    multivitamin with iron-minerals-folic acid  1 tablet Feeding Tube Daily SCH    ondansetron  8 mg Per G Tube TID    rifAXIMin  550 mg Feeding Tube BID SCH    simethicone  160 mg Oral 4x Daily [place]    spironolactone  150 mg Per G Tube Daily [place]    thiamine mononitrate (vit B1)  100 mg Per G  Tube Daily [place]    ursodiol  600 mg Per G Tube BID [place]     Continuous Infusions:  PRN Meds:   0.9% sodium chloride flush  3 mL Intravenous PRN    acetaminophen  650 mg Per G Tube Q8H PRN    albuterol  2.5 mg Nebulization Q4H PRN    Or    albuterol  2.5 mg Nebulization Q4H PRN    aluminum-magnesium hydroxide-simethicone  15 mL Per G Tube Q4H PRN    guaiFENesin  200 mg Oral Q4H PRN    HYDROcodone-acetaminophen  1-2 tablet Per G Tube Q6H PRN    HYDROmorphone  1 mg Oral Q4H PRN    hydrOXYzine  25 mg Feeding Tube Q4H PRN    opium  6 mg Oral 4x Daily PRN    prochlorperazine  5 mg Intravenous Q8H PRN    Or    prochlorperazine  5 mg Per G Tube  Q8H PRN     Data  CBC        05/25/22  0606 05/24/22  0537   WBC 5.0 5.7   HGB 8.3* 7.9*   HCT 26.8* 25.8*   PLT 137* 95*     Coags        05/24/22  0537   INR 1.2       Chem7        05/25/22  0606 05/24/22  0537   NA 131* 132*   K 4.3 4.9   CL 100* 100*   CO2 25 28   BUN 21 22   CREAT 0.64 0.64   GLU 118 120     Electrolytes        05/25/22  0606 05/24/22  0537   CA 8.0* 8.0*   MG 1.8 1.8   PO4 3.6 3.1     Liver Panel        05/25/22  0606 05/24/22  0537   AST 48* 62*   ALT 19 21   ALKP 176* 183*   TBILI 0.4 0.4   ALB  --  1.8*     UA  No results found in last 7 days    Microscopy:  No results found in last 7 days    Microbiology Results (last 72 hours)       Procedure Component Value Units Date/Time    Bacterial Culture, Normally Sterile Sites, With Gram Stain [[id number]] Collected: 05/11/22 1115    Order Status: Completed Specimen: Not Applicable from Peritoneal Fluid Updated: 05/25/22 1357     Gram Stain Rare Host cells (not PMNs, RBCs, or squamous epithelial cells) , No PMNs seen      No organisms seen     Bacterial Culture, Sterile Sites No growth 14 days.    Bacterial Culture, Normally Sterile Sites, With Gram Stain [[id number]] Collected: 05/21/22 1141    Order Status: Completed Specimen: Not Applicable from Peritoneal Fluid Updated: 05/25/22 1057     Gram Stain Rare RBCs , Few  Host cells (not PMNs, RBCs, or squamous epithelial cells) , No PMNs seen      No organisms seen     Bacterial Culture, Sterile Sites No growth 4 days.    Cytomegalovirus DNA, Quantitative PCR, Plasma [[id number]] Collected: 05/25/22 0606    Order Status: Sent Specimen: Blood from Serum Updated: 05/25/22 0616          Radiology Results  Discussed in A/P    I discussed the patient with Johnney Ou* from [facility] regarding A&P.    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  2011: VRd bortezomib, Lenalidomide, Dexamethasone  04/2011: Auto SCT  07/2014: Rd - revlimid 15 mg every other day, dexamethasone 8 mg PO weekly.   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   Receiving dexamethasone 12 mg weekly for diarrhea as outpt  10/16/21 - last Daratumumab dose (on hold due to diarrhea)     DATA:   09/07/2010: IgG Kappa MM  12/02/2021: Ig M <5, IgG 447, IgA 20, Ig E <2, SFLC  Kappa 16.6, Lambda 1.6, Kappa/Lamda 10.38  12/14/21: IgG K on IFE, IgG 430, KLC 16.6, LLC 2.7, KLR 6.15  02/23/22 SPEP with stable M-spike, SFLC decreased Kappa 44.2 / Lambda 5.5 = K/L ratio = 8.04 (overall downtrending still)  04/23/22 M-spike 0.6, IgG K on IFE, IgG 997, Kappa 70.7/Lambda 19.8 = K/L ratio = 3.57  Monitor MM labs monthly per Dr Jacelyn Grip  05/23/22 KLC 82.9, LLC 24.4, KLC 3.4     Chemo:  TBD, therapy held since 10/2021 due to recurrent infections and now deconditioning  Not currently a candidate due to significant deconditioning, but functional status improving and may become a candidate in the future with ongoing rehab.     Supportive Care:   Transfuse for  Hgb <7 and Platelets <10  Access: PIV     Primary Oncologist:   Dr. Jacelyn Grip ([place]), will transfer to Dr. Hassell Done ([place])  Dr. Iona Coach ([place])     Disposition:  Lives in [place] - anticipate discharge to [place]   -Peer to Peer 7/7 with favorable outcome, however patient with already enacted appeal, which will have to be re-appealed - CM following  '[ ]'$  will need paracentesis q7-10 days coordinated outpatient with IR and transportation arranged with family      # Immunocompromised  # Diarrhea - controlled  # c/b CMV Viremia  # Hx prolonged norovirus infection  Immunocompromised due to underlying  malignancy, not currently neutropenic. Recent extended hospitalization at both [place] 1/23-04/16/22, with hypovolemic shock associated with norovirus, CMV colitis, acute liver failure associated with pomylast (see ALF section).  Currently admitted for ongoing diarrhea and FTT in brief time at home after dc from [facility], initially began in 8/22, recently managed at home w/ loperamide BID w/ 2-3 watery stools daily. Diarrhea worsened on 6/12 (11 stools). Possibly r/t to inadequate dosing of loperamide vs infectious etiology. GI c/s 6/13 for possible scope - per patient and family declined endoscopy at this time. ID consultation deferred as infectious w/u negative.   Given CMV improving, norovirus negative, and enteric panel unrevealing - favor non-infectious etiologies for diarrhea. ID has requested we maintain enteric precautions for hospitalization d/t prolonged prior GI infections.   Diarrhea ~4 stools/day as of 7/1, improved to 1 soft/formed stool/d on 7/6.    Data  - Norovirus  - First +norovirus 08/2021 with persistent +norovirus s/p 7-day courses x 2 nitazoxanide (2/16-02/22/23 & 01/2022). Continued to be norovirus positive (most recently on 04/07/22). Norovirus, cdiff, GI PCR neg this admission (6/9).   - CMV  -Regarding CMV colitis, 12/17/21 flex sig showed CMV colitis, sp multiple cycles of  gancyclovir IV, IVIG, valcyte 900 mg PO BID, then ultimately transitioned to letermovir, which she continues. 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 Acute rise in 11/2021,coinciding with acute CMV viremia but liver biopsy CMV stains negative (12/11/21). 12/14/21: Hepatitis/viral work-up negative in 11/2021. LP was done on 12/16/21 for hepatic encephalopathy w/ CMV CSF PCR negative. Suspected to be 2/2 pomalidamide, though liver path from 12/22/21 unlikely for vanishing bile duct syndrome.   Upon admission was more somnolent and one episode of possible visual hallucination reported on 6/23. Her mentation has improved throughout this hospitalization. A&Ox3 as of 7/6.   C/s to Hepatology (7/2) regarding optimizing medical management and improving ascites: ascites felt to be primarily 2/2 malnutrition (see below) and some element of portal HTN. Goal will be to reduce paracentesis need to 1x/month by optimizing diuresis and improving nutritional status. Appreciate recs. Serum Na stable at 134, asymptomatic.     Dx:  - 6/17 KUB: mild ileus and density over abdomen (likely ascitic fluid) - not clinically significant as patient is having regular bowel movements and no PVR noted.  - 6/20 therapeutic paracentesis  by TAPS, -4L + albumin  - 6/23 ammonia normal  - 6/27 paracentesis by TAPS -5L + albumin, SBP unrevealing or infection. cytology negative for malignancy  - 7/2 RUQ u/s w/ doppler: Mild liver surface nodularity c/w early cirrhosis and/or fibrosis. Patent vasculature with normal direction of flow. Sequela of portal hypertension including increased now moderate ascites and splenomegaly. Cholelithiasis.  - 7/7 s/p paracentesis by TAPS - 3.5 L fluid removed - CCD, gram stain unrevealing for SBP    Plan:  -Appreciate hepatology recs:  -Continue spironolactone 150 mg + furosemide 60 mg to minimize accumulation of ascites - consider reducing spironolactone if sodium worsens <130 on 7/8 - (started 6/16, increased 6/19-, last incr 7/3- )  -Hold for SBP<90 or MAP<60  -Monitor I/Os & serum Na  -goal of optimizing so that paracentesis requirement is improved to 1x/month  -Currently requiring paracentesis q7-10d - will need to be arranged for d/c   -Follow up Alpha 1 antitrypsin stool study sent 7/4 '[ ]'$  - rule out malabsorption disorder   -Continue Ursodiol 600 mg PO BID  -Continue rifaximin 550 mg PO BID  -Low sodium diet - challenging in context of tube feeds  -2L fluid restriction  -Delirium precautions  -[place] surveillance next in 11/2022     # Failure to thrive  # PEG tube dependence  # Macrocytic anemia - B12 deficiency  PEG tube placed during last admission. Low B12 level but MMA normal, which is not consistent with B12 deficiency, however still treating at this time as below. Encouraged PO intake in addition to tube feeds. Transitioned to cyclic tube feeds on 0/98.  6/29 - tolerating cyclic TF and improving oral intake  7/3 - tolerating more oral intake, including solids  7/6 - continued improvement in oral intake, including ~50% of lunch on 7/5    Plan:  -Continue cyclic TF - Nutren 1.5 @ 75 mL x 16 hours (1800-1000) - TID residuals per RN protocol  -RD following -  -Continue Thera-M Plus MVI for copper deficiency (Copper =  47 on 6/16)  -Continue mirtazapine '30mg'$  nightly (incr 6/18- ) per palliative recs  -Continue cyanocobalamin 1064mg SQ weekly (6/24- )   -s/p Cyanocobalamin 10071m SQ daily x7 days (6/17-6/23)  -s/p Zinc acetate 6/11-6/23)    # Dysphagia  Dysphagia and aspiration risk assessed by speech language pathology. SLP recommended Modified Barium Swallow (completed on 6/21) which showed moderate pharyngeal aspiration; SLP recommends  regular diet w/ mildly thickened liquids.   6/22 - Pt and daughter request to thicken liquids themselves at bedside for more variety, understand risk of aspiration w/ thins. Liberalized diet per their request to regular.   Appreciate SLP recs.   - See failure to thrive section as well re: PEG tube and nutritional deficiencies     # Pain  # Pressure Injury (sacrum)  Pain primarily r/t pressure injuries though occasionally also d/t abdominal cramping. Wound healing likely limited d/t poor nutritional status (see above) and limited mobility (improving).  -Consulted Palliative Care 6/17 for SMS, now s/o  -Continue hydrocodone-acetaminophen PRN  -Continue hydromorphone per G tube PRN     # Physical Deconditioning  Pt motivated to participate w/ PT/OT, deconditioned after prior extended hospitalization (as above). Showing marked improvements in anterioir weight shifting in WC, endurance w/ static siting balance & bed mobility. Still limited by global weakness, impaired balance, decreased endurance and fatigue.  - Continue PT/OT   - SNF placement recommended    # GOC  Pt is consistent and clear that she wishes to pursue full medical management of her ongoing medical problems, as above, with the goal of prolonging her life. She is motivated by her desire to spend time with her family. This is consistent in conversations with [facility] (6/18) & Dr. Jacelyn Grip (6/21).     VTE PPx:  Contraindicated: Anticipated procedure    Severity of Illness  Stable    Nutritional Assessment  Severe Chronic disease or condition  related malnutrition    During this hospitalization the patient is also being treated for:  - Anemia associated with malignancy (present on admission)    - Thrombocytopenia (present on admission)    - Coagulopathy (present on admission)    - Malignancy associated fatigue on admission  - Neoplasm/malignancy associated pain  - Hypoalbuminemia (present on admission)  - HYPOnatremia (present on admission)  - Fluid status: Volume overload on admission  - Cachexia  - Acute liver failure, present on admission    Code Status: FULL    I spent a total time of 80 minutes in this episode of care preparing to see the patient, obtaining and/or reviewing separately obtained history, performing a medically appropriate examination and/or evaluation, counseling and educating patient/family/caregiver, ordering and reviewing medications, tests, or procedures, referring and communicating with other health care professionals, documenting clinical information in the electronic or other health record, and communicating results to the patient/family/caregiver, and/or other care coordination.    Gardiner Rhyme, NP  Hospital Identification #:  [id number]

## 2022-05-26 LAB — PHOSPHORUS, SERUM / PLASMA: Phosphorus, Serum / Plasma: 3.4 mg/dL (ref 2.3–4.7)

## 2022-05-26 LAB — BASIC METABOLIC PANEL (NA, K, CL, CO2, BUN, CR, GLU, CA)
Anion Gap: 6 (ref 4–14)
Calcium, total, Serum / Plasma: 8.1 mg/dL — ABNORMAL LOW (ref 8.4–10.5)
Carbon Dioxide, Total: 26 mmol/L (ref 22–29)
Chloride, Serum / Plasma: 99 mmol/L — ABNORMAL LOW (ref 101–110)
Creatinine: 0.62 mg/dL (ref 0.55–1.02)
Glucose, non-fasting: 121 mg/dL (ref 70–199)
Potassium, Serum / Plasma: 4.7 mmol/L (ref 3.5–5.0)
Sodium, Serum / Plasma: 131 mmol/L — ABNORMAL LOW (ref 135–145)
Urea Nitrogen, Serum / Plasma: 23 mg/dL (ref 7–25)
eGFRcr: 101 mL/min/{1.73_m2} (ref >59)

## 2022-05-26 LAB — ALKALINE PHOSPHATASE: Alkaline Phosphatase: 200 U/L — ABNORMAL HIGH (ref 38–108)

## 2022-05-26 LAB — COMPLETE BLOOD COUNT WITH DIFFERENTIAL
Abs Basophils: 0.01 10*9/L (ref 0.00–0.10)
Abs Eosinophils: 0.06 10*9/L (ref 0.00–0.40)
Abs Imm Granulocytes: 0.08 10*9/L (ref <0.10)
Abs Lymphocytes: 0.83 10*9/L — ABNORMAL LOW (ref 1.00–3.40)
Abs Monocytes: 0.61 10*9/L (ref 0.20–0.80)
Abs Neutrophils: 4.15 10*9/L (ref 1.80–6.80)
Hematocrit: 26.7 % — ABNORMAL LOW (ref 36.0–46.0)
Hemoglobin: 8.7 g/dL — ABNORMAL LOW (ref 12.0–15.5)
MCH: 32.8 pg (ref 26.0–34.0)
MCHC: 32.6 g/dL (ref 31.0–36.0)
MCV: 101 fL — ABNORMAL HIGH (ref 80–100)
MPV: 12.4 fL (ref 9.1–12.6)
Platelet Count: 136 10*9/L — ABNORMAL LOW (ref 140–450)
RBC Count: 2.65 10*12/L — ABNORMAL LOW (ref 4.00–5.20)
RDW-CV: 16.7 % — ABNORMAL HIGH (ref 11.7–14.4)
WBC Count: 5.7 10*9/L (ref 3.4–10.0)

## 2022-05-26 LAB — ALANINE TRANSAMINASE: Alanine transaminase: 21 U/L (ref 10–61)

## 2022-05-26 LAB — BILIRUBIN, TOTAL: Bilirubin, Total: 0.4 mg/dL (ref 0.2–1.2)

## 2022-05-26 LAB — CYTOMEGALOVIRUS DNA, QUANTITATIVE PCR, PLASMA
CMV DNA (IU/mL): DETECTED IU/mL (ref <30)
CMV DNA (Log IU/mL): DETECTED (ref <1.48)

## 2022-05-26 LAB — ASPARTATE TRANSAMINASE: AST: 49 U/L — ABNORMAL HIGH (ref 5–44)

## 2022-05-26 LAB — MAGNESIUM, SERUM / PLASMA: Magnesium, Serum / Plasma: 1.8 mg/dL (ref 1.6–2.6)

## 2022-05-26 MED ORDER — CYANOCOBALAMIN (VIT B-12) 1,000 MCG/ML INJECTION SOLUTION
1000 | INTRAMUSCULAR | 0.00 refills | 37.50000 days | Status: DC
Start: 2022-05-26 — End: 2022-12-31

## 2022-05-26 MED FILL — URSODIOL (BULK) 100 % POWDER: 100 % | Qty: 0.6

## 2022-05-26 MED FILL — ANTI-DIARRHEAL (LOPERAMIDE) 2 MG TABLET: 2 mg | ORAL | Qty: 2

## 2022-05-26 MED FILL — SIMETHICONE 40 MG/0.6 ML ORAL DROPS,SUSPENSION: 40 mg/0.6 mL | ORAL | Qty: 2.4

## 2022-05-26 MED FILL — SPIRONOLACTONE 25 MG TABLET: 25 mg | ORAL | Qty: 2

## 2022-05-26 MED FILL — CHOLECALCIFEROL (VITAMIN D3) 25 MCG (1,000 UNIT) TABLET: 1000 UNITS | ORAL | Qty: 2

## 2022-05-26 MED FILL — MIRTAZAPINE 30 MG TABLET: 30 mg | ORAL | Qty: 1

## 2022-05-26 MED FILL — ACETAMINOPHEN 325 MG TABLET: 325 mg | ORAL | Qty: 2

## 2022-05-26 MED FILL — XIFAXAN 550 MG TABLET: 550 mg | ORAL | Qty: 1

## 2022-05-26 MED FILL — FUROSEMIDE 20 MG TABLET: 20 mg | ORAL | Qty: 3

## 2022-05-26 MED FILL — FOLIC ACID 1 MG TABLET: 1 mg | ORAL | Qty: 1

## 2022-05-26 MED FILL — PREVYMIS 480 MG TABLET: 480 mg | ORAL | Qty: 1

## 2022-05-26 MED FILL — ONDANSETRON HCL 4 MG TABLET: 4 mg | ORAL | Qty: 2

## 2022-05-26 MED FILL — HYDROXYZINE HCL 25 MG TABLET: 25 mg | ORAL | Qty: 1

## 2022-05-26 MED FILL — THIAMINE MONONITRATE (VITAMIN B1) 100 MG TABLET: 100 mg | ORAL | Qty: 1

## 2022-05-26 MED FILL — THERA M PLUS (FERROUS FUMARATE) 9 MG IRON-400 MCG TABLET: 9 mg iron-400 mcg | ORAL | Qty: 1

## 2022-05-26 NOTE — Consults (Signed)
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    ASSESSMENT  Patient presents to PT re-evaluation d/t expired POC. Since initiation of PT intervention, patient has been participating in [place] activities in [place] and [place]. Able to improve initiation of functional activities, though continues to require significant physical assist for standing activities. At times, patient requires increased time for active listening, reassurance, and to address anxiousness and fear of mobility. Functional mobility progression limited this session by bowel urgency. Donned diaper on prior to mobility and provided patient reassurance, however patient difficult to redirect to functional activity. Patient required mod A for supine<>sit transfer. She required CGA for sitting static balance with B feet supported on STEDY foot platform and BUE support on handrails. Tolerates EOB activities x 10 minutes, increased time needed d/t anxious behaviors, therapeutic rest breaks with emphasis on deep breathing and relaxation strategies. Patient able to tolerate 1 stand from EOB and 1 stand from STEDY flaps, requiring max A x 2. Standing activity tolerance limited to <10 sec bouts, fear of falling and anxious behaviors as well as decreased BLE muscular strength and endurance contributing to impaired standing tolerance. Patient was returned to supine with bedpan in place d/t bowel urgency. She will benefit from ongoing PT intervention to maximize patient's overall functional mobility.    Focus next session: sit<>stand training, encourage OOB    RECOMMENDATIONS  DISCHARGE RECOMMENDATION  Comments Placement with ongoing PT needs      Patient Current Functional Status Sufficient for PT Discharge Recommendation Yes   Discharge DME needs TBD   Discharge transportation needs [facility] Potential Patient  participates well in therapy and progressing towards goals     NURSING RECOMMENDATIONS  Inpatient Rehab Assistive Device Recommendation Vertical dependent lift;Lateral transfer device   Activity Recommendation OOB to chair daily via sling lift     SUBJECTIVE  Subjective report:  Agreeable with encouragement. Reports feeling anxious and fearful  Notable observations:  Returned lying in bed with bed pain. All needs in reach. RN informed    SYSTEMS REVIEW  Cognition/Communication  Delirium screen:  Yes Delirium Screen:   Details:  at risk given prolonged hospitalizations and decreased mob    Communication  Cognition/communication impaired:  Yes        Behavior: Anxious    Integumentary  Integumentary deficits:  YesIntegumentary deficit detail:  PEG    Cardiopulmonary  Cardiopulmonary deficits:  Yes  Detail:  decreased activity tolerance    Musculoskeletal  Musculoskeletal deficits:  Yes  Abnormal strength findings: B LE grossly 3-/5        Pain     Currently in pain: No (denies)     Pain Level Upon Arrival: No hurt  Highest Pain Level During Therapy: No hurt  Pain Level End of Therapy: No hurt    COMPREHENSIVE MOVEMENT ANALYSIS/TREATMENT  Precautions/WB status: Yes  Precautions and weight bearing status comments: Falls, delirium, skin, aspiration, contact      Hemodynamic response:   Normal hemodynamic response: Yes  Comments: VSS on RA      Functional Mobility  Requires second person/additional health care providers: Yes  Type additional health care provider: Rehab aide    Bed mobility Current Initial    Rolling  Level of assist  Contact guard assist  Minimal assist (05/11/22 1331)   Device  Bed rail;Log roll  Bed rail (05/11/22  1331)   Intervention  Verbal cues;Tactile cues;External cues  Verbal cues (05/11/22 1331)   Rolling comments:     Bed mobility Current Initial    Supine < > Sit  Level of assist  Moderate assist  Dependent assist;Two person assist (04/28/22 1132)   Device  Bed rail;Increased time;Log roll  Bed  rail;Increased time;Log roll (04/28/22 1132)   Intervention  Verbal cues;Tactile cues  Verbal cues;Tactile cues (04/28/22 1132)   Supine<>sit comments:   cues for whole body sequencing    Transfer Current Initial   Sit < > Stand  Level of assist  Maximal assist;Two person assist  Maximal assist;Two person assist (05/21/22 1621)   Device  STEDY  STEDY (05/21/22 1621)   Intervention  Verbal cues;Tactile cues;Sequencing;Safe technique  Verbal cues;Tactile cues;Sequencing;Safe technique (05/21/22 1621)   Transfer  Level of assist  From: Bed  To: Wheelchair   Dependent assist;Two person assist (04/29/22 1209)   Device   Vertical dependent lift (04/29/22 1209)   Intervention     Sit < > Stand comments:  tactile cues for hip/trunk extension; u/a to tolerate bed<>chair transfer with STEDY d/t bowel urgency    Balance  Balance deficits noted: Yes  Functional Balance for ADLs  Position Static Dynamic   Sit Static sitting level of assist: Contact guard assist-   Static sitting comment: EOB Dynamic sitting level of assist: Minimal assist-  Dynamic sitting comment: anterior scooting     Stand Static Standing level of assist: Maximal assist-   Static standing comment: STEDY  -          Communication between other health care providers: Communication between other health care provider: RN  Communication comment: pt's functional status     Education assessment  Learner: Patient  Content: Plan of care, Activity recommendations, Discharge recommendations  Response: Needs reinforcement, Verbalizes understanding, Demonstrates understanding  Outcome measures   Physical Therapist Global Assessment of Mobility  Activity Achieved: Active stand with any level of assistance  AMPAC 6-clicks basic mobility score: 10      PLAN  Plan of care status:  Current plan of care remains appropriate  PT frequency:  5x/week  PT duration:  4 weeks.  Comment:  POC expires 8/9    GOALS  Flowsheet Rows      Flowsheet Row Most Recent Value   Patient will  perform supine< >sitting Min A   Patient will perform bed to chair transfers Max A with LRAD   Additional goal Patient will self-propel in wheelchair x 50' and CGA   Additional goal Patient will perform sit<>stand transfer with max A and LRAD          Planned PT interventions:   Specific interventions: Progressive functional mobility training;Balance training;NM Re-ed;Aerobic training;Ther ex  Education interventions: Fall risk reduction;Exercise program;Self-pacing/breathing;Benefits of activity;Caregiver training      Lorella Nimrod, PT    05/26/2022

## 2022-05-26 NOTE — Progress Notes (Addendum)
[facility] PROGRESS NOTE     This is an independent service.   The available consultant for this service is Alena Bills, MD.           24 Hour Course  -HDS, afebrile  -Expedited appeal approved for patient to go to SNF - now waiting for [facility] to work out insurance and [employer] - in the meantime patient continues to engage with PT/OT   - CMV now only detectable, but not quantifiable - c/w favorable treatment response  on letermovir     Subjective  No abdominal pain. Eating and drinking ok.  No issues to report today.    Vitals  Temp:  [37 C (98.6 F)-37.3 C (99.1 F)] 37.3 C (99.1 F)  Heart Rate:  [75-78] 75  *Resp:  [14-16] 16  BP: (94-106)/(66-77) 101/66  SpO2:  [99 %-100 %] 99 %    MostRecent Weight: 67.4 kg (148 lb 9.4 oz)  Admission Weight: 66.2 kg (146 lb)      Intake/Output Summary (Last 24 hours) at 05/26/2022 1619  Last data filed at 05/26/2022 1500  Gross per 24 hour   Intake 1395 ml   Output 900 ml   Net 495 ml       Physical Exam  Gen: NAD, appropriate and engaged  HEENT: MMM, no oral lesions, sclera non-icteric  Resp: CTAB, no rhonchi/wheezing  Card: RRR  Gi: distended, firm, non-tender, +bs x4  Ext: negative BLE edema   Skin: scattered ecchymosis to BUE  Neuro: Alert, oriented x3, no focal neuro deficits  Lines: PIV x1 in place, clean dry & intact dressing    Scheduled Meds:   0.9% sodium chloride flush  3 mL Intravenous Q12H [place]    cholecalciferol (vitamin D3)  2,000 Units Per G Tube Daily [place]    cyanocobalamin (Vitamin B12)  1,000 mcg Subcutaneous Q7 Days    folic acid  1 mg Per G Tube Daily [place]    furosemide  60 mg Per G Tube Daily [place]    lansoprazole  30 mg Per G Tube Q AM Before Breakfast [facility]    letermovir  480 mg Other Daily [place]    loperamide  4 mg Feeding Tube Q8H    mirtazapine  30 mg Per G Tube Daily At Bedtime [facility]    multivitamin with iron-minerals-folic acid  1 tablet Feeding Tube Daily SCH    ondansetron  8 mg Per G Tube TID    rifAXIMin  550 mg Feeding  Tube BID SCH    simethicone  160 mg Oral 4x Daily [place]    spironolactone  150 mg Per G Tube Daily [place]    thiamine mononitrate (vit B1)  100 mg Per G Tube Daily [place]    ursodiol  600 mg Per G Tube BID [place]     Continuous Infusions:  PRN Meds:   0.9% sodium chloride flush  3 mL Intravenous PRN    acetaminophen  650 mg Per G Tube Q8H PRN    albuterol  2.5 mg Nebulization Q4H PRN    Or    albuterol  2.5 mg Nebulization Q4H PRN    aluminum-magnesium hydroxide-simethicone  15 mL Per G Tube Q4H PRN    guaiFENesin  200 mg Oral Q4H PRN    HYDROcodone-acetaminophen  1-2 tablet Per G Tube Q6H PRN    HYDROmorphone  1 mg Oral Q4H PRN    hydrOXYzine  25 mg Feeding Tube Q4H PRN    opium  6 mg Oral 4x Daily PRN    prochlorperazine  5 mg Intravenous Q8H PRN    Or    prochlorperazine  5 mg Per G Tube Q8H PRN     Data  CBC        05/26/22  0548 05/25/22  0606   WBC 5.7 5.0   HGB 8.7* 8.3*   HCT 26.7* 26.8*   PLT 136* 137*     Coags  No results found in last 36 hours    Chem7        05/26/22  0548 05/25/22  0606   NA 131* 131*   K 4.7 4.3   CL 99* 100*   CO2 26 25   BUN 23 21   CREAT 0.62 0.64   GLU 121 118     Electrolytes        05/26/22  0548 05/25/22  0606   CA 8.1* 8.0*   MG 1.8 1.8   PO4 3.4 3.6     Liver Panel        05/26/22  0548 05/25/22  0606   AST 49* 48*   ALT 21 19   ALKP 200* 176*   TBILI 0.4 0.4     UA  No results found in last 7 days    Microscopy:  No results found in last 7 days    Microbiology Results (last 72 hours)       Procedure Component Value Units Date/Time    Bacterial Culture, Normally Sterile Sites, With Gram Stain [[id number]] Collected: 05/21/22 1141    Order Status: Completed Specimen: Not Applicable from Peritoneal Fluid Updated: 05/26/22 1236     Gram Stain Rare RBCs , Few Host cells (not PMNs, RBCs, or squamous epithelial cells) , No PMNs seen      No organisms seen     Bacterial Culture, Sterile Sites No growth 5 days.    Cytomegalovirus DNA, Quantitative PCR, Plasma [[id number]] Collected: 05/25/22 0606     Order Status: Completed Specimen: Blood from Serum Updated: 05/25/22 1707     CMV DNA (IU/mL) DETECTED IU/mL      Comment: Detected, <30 IU/mL, not quantifiable.  *SUBCRITICAL*  Performed by Real-Time PCR  Note new reference range.          CMV DNA (Log IU/mL) DETECTED     Comment: Detected, <1.48 Log IU/mL, not quantifiable.  Note new reference range.         Bacterial Culture, Normally Sterile Sites, With Gram Stain [[id number]] Collected: 05/11/22 1115    Order Status: Completed Specimen: Not Applicable from Peritoneal Fluid Updated: 05/25/22 1357     Gram Stain Rare Host cells (not PMNs, RBCs, or squamous epithelial cells) , No PMNs seen      No organisms seen     Bacterial Culture, Sterile Sites No growth 14 days.          Radiology Results  Discussed in A/P    I discussed the patient with Johnney Ou* from [facility] regarding A&P.    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  2011: VRd bortezomib, Lenalidomide, Dexamethasone  04/2011: Auto SCT  07/2014: Rd - revlimid 15 mg every other day, dexamethasone 8 mg PO weekly.   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   Receiving dexamethasone 12 mg weekly for diarrhea as outpt  10/16/21 - last Daratumumab dose (on hold due to diarrhea)     DATA:   09/07/2010: IgG Kappa MM  12/02/2021: Ig M <5, IgG 447, IgA 20, Ig E <2, SFLC  Kappa 16.6, Lambda 1.6, Kappa/Lamda 10.38  12/14/21: IgG K on IFE, IgG 430, KLC 16.6, LLC 2.7, KLR 6.15  02/23/22 SPEP with stable M-spike, SFLC decreased Kappa 44.2 / Lambda 5.5 = K/L ratio = 8.04 (overall downtrending still)  04/23/22 M-spike 0.6, IgG K on IFE, IgG 997, Kappa 70.7/Lambda 19.8 = K/L ratio = 3.57  Monitor MM labs monthly per Dr Jacelyn Grip  05/23/22 KLC 82.9, LLC 24.4, KLC 3.4     Chemo:  TBD, therapy held since 10/2021 due to recurrent infections and now deconditioning  Not currently a candidate due to significant deconditioning, but functional status improving and may become a candidate in the future with ongoing rehab.     Supportive Care:   Transfuse for Hgb <7 and Platelets <10  Access: PIV     Primary Oncologist:   Dr. Jacelyn Grip ([place]), will transfer to Dr. Hassell Done ([place])  Dr. Iona Coach ([place])     Disposition:  Lives in [place] - anticipate discharge to [place] with [facility] representative - expedited appear approved for discharge to SNF - now waiting for [facility] to approve patient  '[ ]'$  will need paracentesis q7-10 days coordinated outpatient with IR and transportation arranged with family      # Immunocompromised  # Diarrhea - controlled  # c/b CMV Viremia  # Hx prolonged norovirus infection  Immunocompromised due to underlying  malignancy, not currently neutropenic. Recent extended hospitalization at both [facility] 1/23-04/16/22, with hypovolemic shock associated with norovirus, CMV colitis, acute liver failure associated with pomylast (see ALF section).  Currently admitted for ongoing diarrhea and FTT in brief time at home after dc from [facility], initially began in 8/22, recently managed at home w/ loperamide BID w/ 2-3 watery stools daily. Diarrhea worsened on 6/12 (11 stools). Possibly r/t to inadequate dosing of loperamide vs infectious etiology. GI c/s 6/13 for possible scope - per patient and family declined endoscopy at this time. ID consultation deferred as infectious  w/u negative.   Given CMV improving, norovirus negative, and enteric panel unrevealing - favor non-infectious etiologies for diarrhea. ID has requested we maintain enteric precautions for hospitalization d/t prolonged prior GI infections.   Diarrhea ~4 stools/day as of 7/1, improved to 1 soft/formed stool/d on 7/6.    Data  - Norovirus  - First +norovirus 08/2021 with persistent +norovirus s/p 7-day courses x 2 nitazoxanide (2/16-02/22/23 & 01/2022). Continued to be norovirus positive (most recently on 04/07/22). Norovirus, cdiff, GI PCR neg this admission (6/9).   - CMV  -Regarding CMV colitis, 12/17/21 flex sig showed CMV colitis, sp multiple cycles of gancyclovir IV, IVIG, valcyte 900 mg PO BID, then ultimately transitioned to letermovir, which she continues. 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 Acute rise in 11/2021,coinciding with acute CMV viremia but liver biopsy CMV stains negative (12/11/21). 12/14/21: Hepatitis/viral work-up negative in 11/2021. LP was done on 12/16/21 for hepatic encephalopathy w/ CMV CSF PCR negative. Suspected to be 2/2 pomalidamide, though liver path from 12/22/21 unlikely for vanishing bile duct syndrome.   Upon admission was more somnolent and  one episode of possible visual hallucination reported on 6/23. Her mentation has improved throughout this hospitalization. A&Ox3 as of 7/6.   C/s to Hepatology (7/2) regarding optimizing medical management and improving ascites: ascites felt to be primarily 2/2 malnutrition (see below) and some element of portal HTN. Goal will be to reduce paracentesis need to 1x/month by optimizing diuresis and improving nutritional status. Appreciate recs. Serum Na stable at 134, asymptomatic.     Dx:  - 6/17 KUB: mild ileus and density over abdomen (likely ascitic fluid) - not clinically significant as patient is having regular bowel movements and no PVR noted.  - 6/20 therapeutic paracentesis by TAPS, -4L + albumin  - 6/23 ammonia normal  - 6/27 paracentesis by TAPS -5L + albumin, SBP unrevealing or infection. cytology negative for malignancy  - 7/2 RUQ u/s w/ doppler: Mild liver surface nodularity c/w early cirrhosis and/or fibrosis. Patent vasculature with normal direction of flow. Sequela of portal hypertension including increased now moderate ascites and splenomegaly. Cholelithiasis.  - 7/7 s/p paracentesis by TAPS - 3.5 L fluid removed - CCD, gram stain unrevealing for SBP    Plan:  -Appreciate hepatology recs:  -Continue spironolactone 150 mg + furosemide 60 mg to minimize accumulation of ascites - consider reducing spironolactone if sodium worsens <130  - (started 6/16, increased 6/19-, last incr 7/3- )  -Hold for SBP<90 or MAP<60  -Monitor I/Os & serum Na  -goal of optimizing so that paracentesis requirement is improved to 1x/month  -Currently requiring paracentesis q7-10d - will need to be arranged for discharge - SNF will need to monitor and arrange accordingly - now that patient has been medically optimized by diuresis - may not need as frequent paracenteses  -Continue Ursodiol 600 mg PO BID  -Continue rifaximin 550 mg PO BID  -Low sodium diet - challenging in context of tube feeds  -2L fluid restriction  -Delirium  precautions  -[place] surveillance next in 11/2022     # Failure to thrive  # PEG tube dependence  # Macrocytic anemia - B12 deficiency  PEG tube placed during last admission. Low B12 level but MMA normal, which is not consistent with B12 deficiency, however still treating at this time as below. Encouraged PO intake in addition to tube feeds. Transitioned to cyclic tube feeds on 3/53.  6/29 - tolerating cyclic TF and  improving oral intake  7/3 - tolerating more oral intake, including solids  7/6 - continued improvement in oral intake, including ~50% of lunch on 7/5    Plan:  -Continue cyclic TF - Nutren 1.5 @ 75 mL x 16 hours (1800-1000) - TID residuals per RN protocol  -RD following -  -Continue Thera-M Plus MVI for copper deficiency (Copper = 47 on 6/16)  -Continue mirtazapine '30mg'$  nightly (incr 6/18- ) per palliative recs  -Continue cyanocobalamin 1014mg SQ weekly (6/24- )   -s/p Cyanocobalamin 10050m SQ daily x7 days (6/17-6/23)  -s/p Zinc acetate 6/11-6/23)    # Dysphagia  Dysphagia and aspiration risk assessed by speech language pathology. SLP recommended Modified Barium Swallow (completed on 6/21) which showed moderate pharyngeal aspiration; SLP recommends regular diet w/ mildly thickened liquids.   6/22 - Pt and daughter request to thicken liquids themselves at bedside for more variety, understand risk of aspiration w/ thins. Liberalized diet per their request to regular.   Appreciate SLP recs.   - See failure to thrive section as well re: PEG tube and nutritional deficiencies     # Pain  # Pressure Injury (sacrum)  Pain primarily r/t pressure injuries though occasionally also d/t abdominal cramping. Wound healing likely limited d/t poor nutritional status (see above) and limited mobility (improving).  -Consulted Palliative Care 6/17 for SMS, now s/o  -Continue hydrocodone-acetaminophen PRN  -Continue hydromorphone per G tube PRN     # Physical Deconditioning  Pt motivated to participate w/ PT/OT, deconditioned  after prior extended hospitalization (as above). Showing marked improvements in anterioir weight shifting in WC, endurance w/ static siting balance & bed mobility. Still limited by global weakness, impaired balance, decreased endurance and fatigue.  - Continue PT/OT   - SNF placement recommended    # GOC  Pt is consistent and clear that she wishes to pursue full medical management of her ongoing medical problems, as above, with the goal of prolonging her life. She is motivated by her desire to spend time with her family. This is consistent in conversations with [facility] (6/18) & Dr. WoJacelyn Grip6/21).     VTE PPx:  Contraindicated: Anticipated procedure    Severity of Illness  Stable    Nutritional Assessment  Severe Chronic disease or condition related malnutrition    During this hospitalization the patient is also being treated for:  - Anemia associated with malignancy (present on admission)    - Thrombocytopenia (present on admission)    - Coagulopathy (present on admission)    - Malignancy associated fatigue on admission  - Neoplasm/malignancy associated pain  - Hypoalbuminemia (present on admission)  - HYPOnatremia (present on admission)  - Fluid status: Volume overload on admission  - Cachexia  - Acute liver failure, present on admission    Code Status: FULL    I spent a total time of 80 minutes in this episode of care preparing to see the patient, obtaining and/or reviewing separately obtained history, performing a medically appropriate examination and/or evaluation, counseling and educating patient/family/caregiver, ordering and reviewing medications, tests, or procedures, referring and communicating with other health care professionals, documenting clinical information in the electronic or other health record, and communicating results to the patient/family/caregiver, and/or other care coordination.    CaGardiner RhymeNP  Hospital Identification #:  [id number]

## 2022-05-26 NOTE — Plan of Care (Signed)
Problem: Discharge Planning - Adult  Goal: Knowledge of and participation in plan of care  Outcome: Progress within 12 hours     Problem: Fall, at Risk or Actual - Adult  Goal: Absence of falls and fall related injury  Outcome: Progress within 12 hours     Problem: Delirium - Adult / Pediatric  Goal: Absence or resolution of delirium  Outcome: Progress within 12 hours     Problem: Nutrition, Alteration in -Adult  Goal: Adequate nutritional intake  Outcome: Progress within 12 hours  Goal: Maximize nutritional intake per patient condition  Outcome: Progress within 12 hours     Problem: Pressure Injury,At Risk and Actual- Adult  Goal: Absence of any new pressure injury  Outcome: Progress within 12 hours  Goal: Pressure injury healing/stabilization  Outcome: Progress within 12 hours  Goal: Participation in preventative efforts and treatment plan (Patient/Family/Caregiver)  Outcome: Progress within 12 hours  Goal: Adequate nutritional intake  Outcome: Progress within 12 hours     Problem: GI Elimination Impaired - Adult  Goal: Passage of soft, formed stool  Outcome: Progress within 12 hours  Goal: Stool elimination per clinical condition ( e.g. GI ostomies )  Outcome: Progress within 12 hours

## 2022-05-26 NOTE — Interdisciplinary (Addendum)
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 hx of protracted norovirus infection - now cleared), acute liver injury, admitted for workup of persistent diarrhea (non-infectious, now at baseline 1-2/day) - overall deconditioned - making excellent strides with PT/OT, but will need SNF placement for rehabilitation, nutrition, wound care, and nursing with goal to transition to home - not currently chemotherapy candidate until ECOG improves       Functional Status  Mobility/Safety  Number of Person to Assist: none  Assistive Device Used: None  Rehab Assistive Device Recommendation: Vertical dependent lift, Lateral transfer device    Financial Status  Payor:  [employer]  Second Payor:      EDD          Additional Assessment/Plan Details   CM was alerted by provider that appeal for pt to dc to SNF was accepted.  CM called Traci @ [facility] Phone:  [phone], [phone] and left VM.  She called back saying she did not have auth or a contact @ [employer] to admit pt yet.  CM called an sw customer service Jes [phone].  She said she had the number but would not give it to CM bc all the information (NPI of SNF, etc.) not readily available.  CM called Traci back, go agreed to call back in the morning.  Once auth is obtained pt can dc.     CM called Traci again in the morning, she said no one called to verify auth number was approved and when it the end date would be.  She also needed a CM managing the case so she can call that person for an extension of care when auth expires.  CM called [employer] after going through phone tree and eventually being connected to [employer] customer service.  CM obtained active auth: [id number]  Approved from 7/12-7/16.  Clinicals need to be sent to [phone] incl auth number, clinicals and ins id, Nurse number:  Modesta Messing CM is following the case and provided Traci's ph number.      Pt family and team alerted.     [phone]: Ask for RN, Main lobby    Pam Drown  MSN, RN, CM  [facility] Case Management  Hematology, Oncology    05/26/2022 - 05/27/22

## 2022-05-26 NOTE — Discharge Instructions (Signed)
An appointment request has been made for Video Visit with Dr. Hassell Done who will be your new provider since Dr. Jacelyn Grip has left.  At this time you are receiving rehabilitation services as a skilled nursing facility until you are strong enough to receive treatment.      When you get stronger, you can follow up at the [facility] outpatient Hematology & BMT clinic to re-establish with Dr. Hassell Done and continue lab monitoring, and as needed infusions.    You have also be receiving regular paracentesis due to fluid accumulation in your abdomen. You were seen by Hepatology ([facility]) and it was recommended to manage abdominal swelling with improved nutrition and diuretic therapy.  We have placed a 1 time referral to Interventional Radiology to have your abdomen drain of fluid due around 06/04/22.  If you need more of these procedures in the future, please coordinate with a provider.        If you are not called regarding scheduling an appointment within 24 hours - please see instructions below.     Call clinic immediately for the following: [phone])  Fever greater than 38o C or 100.4o F and/or shaking chills   Shortness of breath   Severe cough   Bleeding that does not stop on its own  Nausea/vomiting that does not get better with nausea medications  Inability to take oral medications  3 or more loose or liquid stools per day  If you have fallen *(see below)  Concerns about your central line    Do NOT use My Chart to communicate any of these symptoms.    ALWAYS CALL 911 for emergencies, such as:  New or increase in chest pain  New or increase in shortness of breath  Changes in mental status, speech, ability to walk  *If you have fallen and hit your head, are bleeding, unable to walk or move extremities, or are in severe pain.    Do NOT use My Chart to communicate any of these symptoms.    General tips:   A balance of activities is important. Be sure to gradually increase your exercise, but avoid overexertion &  exhaustion.  Hand-washing is important. Be sure that you and the others you live with wash their hands frequently and always before preparing food or after using the bathroom.  You can resume a regular diet unless instructed otherwise by your doctor or nurse practitioner.  Please arrive 1 hour before your scheduled MD, NP or PIC ([place]) appointment to have your labs drawn, unless you are told otherwise. This allows time for labs results to come in prior to your appointment.   Prescription refills can take time to process. Please request refills before discharge from the hospital, or from the clinic at least 1 week before you run out of your medication.  You should have your follow up visit scheduled within 24 hours (1 business day) of being discharged from the hospital. If you do not hear from our discharge coordinator within this time, please call our main line ([phone]), press #1, then #3 for an MD or NP appointment or #4 for a PIC (infusion) appointment. You can ask to speak with the discharge coordinator when you reach a live person.

## 2022-05-27 LAB — BASIC METABOLIC PANEL (NA, K, CL, CO2, BUN, CR, GLU, CA)
Anion Gap: 6 (ref 4–14)
Calcium, total, Serum / Plasma: 8.2 mg/dL — ABNORMAL LOW (ref 8.4–10.5)
Carbon Dioxide, Total: 27 mmol/L (ref 22–29)
Chloride, Serum / Plasma: 99 mmol/L — ABNORMAL LOW (ref 101–110)
Creatinine: 0.57 mg/dL (ref 0.55–1.02)
Glucose, non-fasting: 106 mg/dL (ref 70–199)
Potassium, Serum / Plasma: 4.6 mmol/L (ref 3.5–5.0)
Sodium, Serum / Plasma: 132 mmol/L — ABNORMAL LOW (ref 135–145)
Urea Nitrogen, Serum / Plasma: 23 mg/dL (ref 7–25)
eGFRcr: 103 mL/min/{1.73_m2} (ref >59)

## 2022-05-27 LAB — COMPLETE BLOOD COUNT WITH DIFFERENTIAL
Abs Basophils: 0.03 10*9/L (ref 0.00–0.10)
Abs Eosinophils: 0.05 10*9/L (ref 0.00–0.40)
Abs Imm Granulocytes: 0.09 10*9/L (ref <0.10)
Abs Lymphocytes: 0.73 10*9/L — ABNORMAL LOW (ref 1.00–3.40)
Abs Monocytes: 0.69 10*9/L (ref 0.20–0.80)
Abs Neutrophils: 3.36 10*9/L (ref 1.80–6.80)
Hematocrit: 28.3 % — ABNORMAL LOW (ref 36.0–46.0)
Hemoglobin: 8.5 g/dL — ABNORMAL LOW (ref 12.0–15.5)
MCH: 30.5 pg (ref 26.0–34.0)
MCHC: 30 g/dL — ABNORMAL LOW (ref 31.0–36.0)
MCV: 101 fL — ABNORMAL HIGH (ref 80–100)
MPV: 12.1 fL (ref 9.1–12.6)
Platelet Count: 130 10*9/L — ABNORMAL LOW (ref 140–450)
RBC Count: 2.79 10*12/L — ABNORMAL LOW (ref 4.00–5.20)
RDW-CV: 16.6 % — ABNORMAL HIGH (ref 11.7–14.4)
WBC Count: 5 10*9/L (ref 3.4–10.0)

## 2022-05-27 LAB — BILIRUBIN, TOTAL: Bilirubin, Total: 0.4 mg/dL (ref 0.2–1.2)

## 2022-05-27 LAB — ALANINE TRANSAMINASE: Alanine transaminase: 20 U/L (ref 10–61)

## 2022-05-27 LAB — MAGNESIUM, SERUM / PLASMA: Magnesium, Serum / Plasma: 1.9 mg/dL (ref 1.6–2.6)

## 2022-05-27 LAB — ALBUMIN, SERUM / PLASMA: Albumin, Serum / Plasma: 2 g/dL — ABNORMAL LOW (ref 3.4–4.8)

## 2022-05-27 LAB — PHOSPHORUS, SERUM / PLASMA: Phosphorus, Serum / Plasma: 3.9 mg/dL (ref 2.3–4.7)

## 2022-05-27 LAB — ALKALINE PHOSPHATASE: Alkaline Phosphatase: 179 U/L — ABNORMAL HIGH (ref 38–108)

## 2022-05-27 LAB — ASPARTATE TRANSAMINASE: AST: 50 U/L — ABNORMAL HIGH (ref 5–44)

## 2022-05-27 MED ORDER — RIFAXIMIN 550 MG TABLET
550 mg | ORAL_TABLET | Freq: Two times a day (BID) | ORAL | 0 refills | Status: DC
Start: 2022-05-27 — End: 2022-08-25

## 2022-05-27 MED ORDER — HYDROCODONE 5 MG-ACETAMINOPHEN 325 MG TABLET
5-325 | ORAL_TABLET | Freq: Four times a day (QID) | ORAL | 0 refills | 30.00000 days | Status: DC | PRN
Start: 2022-05-27 — End: 2023-05-19

## 2022-05-27 MED FILL — CHOLECALCIFEROL (VITAMIN D3) 25 MCG (1,000 UNIT) TABLET: 1000 UNITS | ORAL | Qty: 2

## 2022-05-27 MED FILL — SIMETHICONE 40 MG/0.6 ML ORAL DROPS,SUSPENSION: 40 mg/0.6 mL | ORAL | Qty: 2.4

## 2022-05-27 MED FILL — FUROSEMIDE 20 MG TABLET: 20 mg | ORAL | Qty: 3

## 2022-05-27 MED FILL — ANTI-DIARRHEAL (LOPERAMIDE) 2 MG TABLET: 2 mg | ORAL | Qty: 2

## 2022-05-27 MED FILL — THIAMINE MONONITRATE (VITAMIN B1) 100 MG TABLET: 100 mg | ORAL | Qty: 1

## 2022-05-27 MED FILL — HYDROXYZINE HCL 25 MG TABLET: 25 mg | ORAL | Qty: 1

## 2022-05-27 MED FILL — XIFAXAN 550 MG TABLET: 550 mg | ORAL | Qty: 1

## 2022-05-27 MED FILL — ONDANSETRON HCL 4 MG TABLET: 4 mg | ORAL | Qty: 2

## 2022-05-27 MED FILL — FOLIC ACID 1 MG TABLET: 1 mg | ORAL | Qty: 1

## 2022-05-27 MED FILL — THERA M PLUS (FERROUS FUMARATE) 9 MG IRON-400 MCG TABLET: 9 mg iron-400 mcg | ORAL | Qty: 1

## 2022-05-27 MED FILL — SPIRONOLACTONE 25 MG TABLET: 25 mg | ORAL | Qty: 2

## 2022-05-27 MED FILL — MIRTAZAPINE 30 MG TABLET: 30 mg | ORAL | Qty: 1

## 2022-05-27 MED FILL — ACETAMINOPHEN 325 MG TABLET: 325 mg | ORAL | Qty: 2

## 2022-05-27 MED FILL — PREVYMIS 480 MG TABLET: 480 mg | ORAL | Qty: 1

## 2022-05-27 MED FILL — URSODIOL 60 MG/ML ORAL SYRUP: 60 mg/mL | ORAL | Qty: 10

## 2022-05-27 MED FILL — URSODIOL (BULK) 100 % POWDER: 100 % | Qty: 0.6

## 2022-05-27 MED FILL — LANSOPRAZOLE 3 MG/ML ORAL SUSPENSION COMPOUNDING KIT: 3 mg/mL | ORAL | Qty: 10

## 2022-05-27 NOTE — Interdisciplinary (Addendum)
CASE MANAGEMENT DISCHARGE     CASE MANAGEMENT DISCHARGE (most recent)       Discharge Note Flowsheet - 04/22/22          Final Discharge Note    *Primary Case Manager (First and Last Name) Pam Drown     Final Discharge Disposition [facility]     Skilled or Acute needs Physical Therapy;Occupational Therapy     Patient Choice CMS Provider List was given to patient and/or designee.  Patient's 1st and 2nd choice of provider not available, arrangements for alternative provider have been made.  Patient, family or legal decision maker, and team agree with this discharge plan     Patient/Parent/Surrogate Decision Maker agrees with the plan Yes        SNF/Subacute    RN to RN report phone number [phone]        Transportation Arrangements    Date of Transport 05/27/22     Time of Transport 1800     Transportation arrangements Case Management/Social Work coordinated Medical illustrator needs assessment complete [place] agrees to pay     [employer] [facility]   $401                    Contact information for after-discharge care                [facility]    [address]  [place] [address]   Phone: [phone]                     CM sw Traci, and DON Remo Lipps, clarified pt is NOT on iso precautions.  CM obtained auth from [place] and gave info to [place].  Final packet sent in AS and in [place] [place].    CM received call from [facility] RN that pt had not been picked up by 7:15 (scheduled for 6PM).  Lucianne Lei came at 7:30.  CM instructed BS RN to tell daughter to pay upfront for Lucianne Lei.      Pam Drown  MSN, RN, CM  [facility] Case Management  Hematology, Oncology

## 2022-05-27 NOTE — Discharge Summary (Signed)
[facility] SUMMARY     Patient Name: Ariel Braun  Patient MRN: [id number]  Date of Birth: Oct 09, 1961    Facility: [facility]  Attending Physician: Alena Bills, MD    Date of Admission: 04/22/2022  Date of Discharge: 05/27/2022    Admission Diagnosis: Multiple Myeloma   Discharge Diagnosis: Multiple myeloma and immunoproliferative neoplasms (CMS code)    Discharge Disposition: [place].  Facility Name: [facility]     History (with Chief Complaint)    Ariel Braun is a 61 y.o. female w/ chronic diarrhea (CMV colitis - controlled and hx of protracted norovirus infection - now cleared), acute liver injury, admitted for workup of persistent diarrhea (non-infectious, now at baseline 1-2/day) - overall deconditioned - making excellent strides with PT/OT, but will need SNF placement for rehabilitation, nutrition, wound care, and nursing with goal to transition to home - not currently chemotherapy candidate until ECOG improves.    On day of discharge, patient feels okay. She has no abdominal pain. She denies headache, dizziness, cough, congestion, runny nose, sore throat, chest pain, SOB, N/V/D.       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  2011: VRd bortezomib, Lenalidomide, Dexamethasone  04/2011: Auto SCT  07/2014: Rd - revlimid 15 mg every other day, dexamethasone 8 mg PO weekly.   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   Receiving dexamethasone 12 mg weekly for diarrhea as outpt  10/16/21 - last Daratumumab dose (on hold due to diarrhea)     DATA:   09/07/2010: IgG Kappa MM  12/02/2021: Ig M <5, IgG 447, IgA 20, Ig E <2, SFLC Kappa 16.6, Lambda 1.6, Kappa/Lamda 10.38  12/14/21: IgG K on IFE, IgG 430, KLC 16.6, LLC 2.7, KLR 6.15  02/23/22 SPEP with stable M-spike, SFLC decreased Kappa 44.2 / Lambda 5.5 = K/L ratio = 8.04 (overall downtrending still)  04/23/22 M-spike 0.6, IgG K on IFE, IgG 997, Kappa 70.7/Lambda 19.8 = K/L ratio = 3.57  Monitor MM labs monthly per Dr Jacelyn Grip  05/23/22 KLC 82.9, LLC 24.4, KLC 3.4     Chemo:  TBD, therapy held since 10/2021 due to recurrent infections and now deconditioning  Not currently a candidate due to significant deconditioning, but functional status improving and may become a candidate in the future with ongoing rehab.      Supportive Care:   Transfuse for Hgb <7 and Platelets <10  Access: PIV     Primary Oncologist:   Dr. Jacelyn Grip ([place]), will transfer to Dr. Hassell Done ([place])  Dr. Iona Coach ([facility])     Disposition:  Lives in [place] - anticipate discharge to [facility] with [facility] representative - expedited appear approved for discharge to SNF - [facility] approve transfer on 7/13.  _0  will  need paracentesis q10-14 days coordinated outpatient with IR. Spoke to daughter [name] and they will schedule all future appointments through [facility] as they had been previously.      # Immunocompromised  # Diarrhea - controlled  # c/b CMV Viremia  # Hx prolonged norovirus infection  Immunocompromised due to underlying  malignancy, not currently neutropenic. Recent extended hospitalization at both [facility] 1/23-04/16/22, with hypovolemic shock associated with norovirus, CMV colitis, acute liver failure associated with pomylast (see ALF section).  Currently admitted for  ongoing diarrhea and FTT in brief time at home after dc from [facility], initially began in 8/22, recently managed at home w/ loperamide BID w/ 2-3 watery stools daily. Diarrhea worsened on 6/12 (11 stools). Possibly r/t to inadequate dosing of loperamide vs infectious etiology. GI c/s 6/13 for possible scope - per patient and family declined endoscopy at this time. ID consultation deferred as infectious w/u negative.   Given CMV improving, norovirus negative, and enteric panel unrevealing - favor non-infectious etiologies for diarrhea. ID has requested we maintain enteric precautions for hospitalization d/t prolonged prior GI infections.   Diarrhea ~4 stools/day as of 7/1, improved to 1 soft/formed stool/d on 7/6.     Data  - Norovirus  - First +norovirus 08/2021 with persistent +norovirus s/p 7-day courses x 2 nitazoxanide (2/16-02/22/23 & 01/2022). Continued to be norovirus positive (most recently on 04/07/22). Norovirus, cdiff, GI PCR neg this admission (6/9).   - CMV  -Regarding CMV colitis, 12/17/21 flex sig showed CMV colitis, sp multiple cycles of gancyclovir IV, IVIG, valcyte 900 mg PO BID, then ultimately transitioned to letermovir, which she continues. 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 Acute rise in 11/2021,coinciding with acute CMV viremia but liver biopsy CMV stains negative (12/11/21). 12/14/21: Hepatitis/viral work-up negative in 11/2021. LP was done on 12/16/21 for hepatic encephalopathy w/ CMV CSF PCR negative. Suspected to be 2/2 pomalidamide, though liver path from 12/22/21 unlikely for vanishing bile duct syndrome.   Upon admission was more somnolent and one episode of possible visual hallucination reported on 6/23. Her mentation has improved throughout this hospitalization. A&Ox3 as of 7/6.   C/s to Hepatology (7/2) regarding optimizing medical management and improving ascites: ascites felt to be primarily 2/2 malnutrition (see below) and some element of portal HTN. Goal will be to reduce paracentesis need to 1x/month by optimizing diuresis and improving nutritional status. Appreciate recs. Serum Na stable at 134, asymptomatic.      Dx:  - 6/17 KUB: mild ileus and density over abdomen (likely ascitic fluid) - not clinically significant as patient is having regular bowel movements and no PVR noted.  - 6/20 therapeutic  paracentesis by TAPS, -4L + albumin  - 6/23 ammonia normal  - 6/27 paracentesis by TAPS -5L + albumin, SBP unrevealing or infection. cytology negative for malignancy  - 7/2 RUQ u/s w/ doppler: Mild liver surface nodularity c/w early cirrhosis and/or fibrosis. Patent vasculature with normal direction of flow. Sequela of portal hypertension including increased now moderate ascites and splenomegaly. Cholelithiasis.  - 7/7 s/p paracentesis by TAPS - 3.5 L fluid removed - CCD, gram stain unrevealing for SBP     Plan:  -Appreciate hepatology recs:  -Continue spironolactone 150 mg + furosemide 60 mg to minimize accumulation of ascites - consider reducing spironolactone if sodium worsens <130  - (started 6/16, increased 6/19-, last incr 7/3- )  -Hold for SBP<90 or MAP<60  -Monitor I/Os & serum Na  -goal of optimizing so that paracentesis requirement is improved to  1x/month  -Currently requiring paracentesis q7-10d - will need to be arranged for discharge - SNF will need to monitor and arrange accordingly - now that patient has been medically optimized by diuresis - may not need as frequent paracenteses  -Continue Ursodiol 600 mg PO BID  -Continue rifaximin 550 mg PO BID  -Low sodium diet - challenging in context of tube feeds  -2L fluid restriction  -Delirium precautions  -[place] surveillance next in 11/2022     # Failure to thrive  # PEG tube dependence  # Macrocytic anemia - B12 deficiency  PEG tube placed during last admission. Low B12 level but MMA normal, which is not consistent with B12 deficiency, however still treating at this time as below. Encouraged PO intake in addition to tube feeds. Transitioned to cyclic tube feeds on 4/03.  6/29 - tolerating cyclic TF and improving oral intake  7/3 - tolerating more oral intake, including solids  7/6 - continued improvement in oral intake, including ~50% of lunch on 7/5     Plan:  -Continue cyclic TF - Nutren 1.5 @ 75 mL x 16 hours (1800-1000) - TID residuals per RN protocol  -RD following -  -Continue Thera-M Plus MVI for copper deficiency (Copper = 47 on 6/16)  -Continue mirtazapine 76m nightly (incr 6/18- ) per palliative recs  -Continue cyanocobalamin 1005m SQ weekly (6/24- )   -s/p Cyanocobalamin 100024mSQ daily x7 days (6/17-6/23)  -s/p Zinc acetate 6/11-6/23)     # Dysphagia  Dysphagia and aspiration risk assessed by speech language pathology. SLP recommended Modified Barium Swallow (completed on 6/21) which showed moderate pharyngeal aspiration; SLP recommends regular diet w/ mildly thickened liquids.   6/22 - Pt and daughter request to thicken liquids themselves at bedside for more variety, understand risk of aspiration w/ thins. Liberalized diet per their request to regular.   Appreciate SLP recs.   - See failure to thrive section as well re: PEG tube and nutritional deficiencies     # Pain  # Pressure Injury  (sacrum)  Pain primarily r/t pressure injuries though occasionally also d/t abdominal cramping. Wound healing likely limited d/t poor nutritional status (see above) and limited mobility (improving).  -Consulted Palliative Care 6/17 for SMS, now s/o  -Continue hydrocodone-acetaminophen PRN  -Continue hydromorphone per G tube PRN     # Physical Deconditioning  Pt motivated to participate w/ PT/OT, deconditioned after prior extended hospitalization (as above). Showing marked improvements in anterioir weight shifting in WC, endurance w/ static siting balance & bed mobility. Still limited by global weakness, impaired balance, decreased endurance and fatigue.  - Continue PT/OT   - SNF  placement recommended     # GOC  Pt is consistent and clear that she wishes to pursue full medical management of her ongoing medical problems, as above, with the goal of prolonging her life. She is motivated by her desire to spend time with her family. This is consistent in conversations with [facility] (6/18) & Dr. Jacelyn Grip (6/21).       Physical Exam at Discharge  BP 120/70 (BP Location: Left upper arm, Patient Position: Lying)   Pulse 78   Temp 36.9 C (98.4 F) (Oral)   Resp 14   Wt 67.4 kg (148 lb 9.4 oz)   SpO2 100%   BMI 28.79 kg/m       Intake/Output Summary (Last 24 hours) at 05/27/2022 1316  Last data filed at 05/27/2022 1025  Gross per 24 hour   Intake 1203.75 ml   Output 1100 ml   Net 103.75 ml       Physical Exam  HENT:      Head: Normocephalic and atraumatic.      Mouth/Throat:      Mouth: Mucous membranes are dry.      Pharynx: Oropharynx is clear.   Cardiovascular:      Rate and Rhythm: Normal rate and regular rhythm.      Pulses: Normal pulses.      Heart sounds: Normal heart sounds.   Pulmonary:      Effort: Pulmonary effort is normal.      Breath sounds: Normal breath sounds.   Abdominal:      General: Bowel sounds are normal. There is distension.   Musculoskeletal:         General: Normal range of motion.      Cervical  back: Normal range of motion.   Skin:     General: Skin is warm and dry.   Neurological:      General: No focal deficit present.      Mental Status: She is alert and oriented to person, place, and time.       Nutritional Assessment  Severe Chronic disease or condition related malnutrition      During this hospital stay, the patient was treated for the following conditions:   - - Anemia associated with malignancy (present on admission)    - Thrombocytopenia (present on admission)    - Coagulopathy (present on admission)    - Malignancy associated fatigue on admission  - Neoplasm/malignancy associated pain  - Hypoalbuminemia (present on admission)  - HYPOnatremia (present on admission)  - Fluid status: Volume overload on admission  - Cachexia  - Acute liver failure, present on admission    I spent 90 minutes preparing discharge materials, prescriptions, follow up plans, and face-to-face time with the patient/family discussing inpatient findings/plans.    DISCHARGE INSTRUCTIONS    Discharge Diet  Regular Diet    PHYSICAL THERAPY ASSESSMENTS AND RECOMMENDATIONS AT DISCHARGE   Available equipment or existing home modifications:  hospital bed, manual wheelchair, commode  Prior functional limitations:  Was home for 1 week, mostly bed dependent  Rehab potential:  Patient participates well in therapy and progressing towards goals  Discharge Activity comments:  OOB to chair daily via sling lift    Functional Assessment at Discharge/Activity Goals  Patient Discharge Activity       Mobilize with assistance   As directed      Provider NPI: [id number]    Instructions: Prior Level of Function:    Prior Living Environment Comments: Per chart review, lives in  two-story home with children. Stairs to enter  Available equipment or existing home modifications: hospital bed, manual wheelchair, commode  Prior functional limitations (device for gait, ADL, IADL, vision, cognition) : Was home for 1 week, mostly bed dependent    Nursing  Recommendations:    Rehab Assistive Device Recommendation: Vertical dependent lift, Lateral transfer device  Activity recommendations comments: OOB to chair daily via sling lift      Up to chair for all meals; unless on bedrest; 3 Times Daily   As directed              Allergies and Medications at Discharge    Allergies: Diphenoxylate-atropine and Caffeine    Your Medications at the End of This Hospitalization               cholecalciferol, vitamin D3, 1000 UNITS tablet 2 tablets (2,000 Units total) by Feeding Tube route daily    cyanocobalamin, Vitamin B12, 1,000 mcg/mL injection Inject 1 mL (1,000 mcg total) under the skin every 7 (seven) days Every Saturday    folic acid (FOLVITE) 1 mg tablet 1 tablet (1 mg total) by Feeding Tube route daily    furosemide (LASIX) 20 mg tablet 3 tablets (60 mg total) by Feeding Tube route daily Hold for SBP less than 90.    HYDROcodone-acetaminophen (NORCO) 5-325 mg tablet 1-2 tablets by Feeding Tube route every 6 (six) hours as needed for Pain    hydrOXYzine (ATARAX) 25 mg tablet 1 tablet (25 mg total) by Feeding Tube route every 4 (four) hours as needed for Anxiety    lansoprazole (PREVACID) 3 mg/mL suspension 10 mL (30 mg total) by Feeding Tube route every morning before breakfast    letermovir (PREVYMIS) tablet 1 tablet (480 mg) by feeding tube daily    loperamide (IMODIUM A-D) 2 mg tablet Take 2 tablets (4 mg) by feeding tube three times daily.    mirtazapine (REMERON) 30 mg tablet 1 tablet (30 mg total) by Per G Tube route nightly at bedtime    multivitamin with iron-minerals-folic acid (THERA M PLUS) 9 mg iron-400 mcg tablet 1 tablet by Feeding Tube route daily    ondansetron (ZOFRAN) 8 mg tablet 1 tablet (8 mg total) by Feeding Tube route in the morning and 1 tablet (8 mg total) at noon and 1 tablet (8 mg total) in the evening.    prochlorperazine (COMPAZINE) 5 mg tablet 1 tablet (5 mg total) by Feeding Tube route every 8 (eight) hours as needed for Nausea    rifAXIMin  (XIFAXAN) 550 mg tablet 1 tablet (550 mg total) by Feeding Tube route 2 (two) times daily    simethicone (MYLICON) 40 VB/1.6 mL drops 1.2 mL (80 mg total) by Feeding Tube route in the morning and 1.2 mL (80 mg total) at noon and 1.2 mL (80 mg total) in the evening and 1.2 mL (80 mg total) before bedtime.    simethicone (MYLICON) 40 OM/6.0 mL drops Take 2.4 mL (160 mg total) by mouth in the morning and 2.4 mL (160 mg total) at noon and 2.4 mL (160 mg total) in the evening and 2.4 mL (160 mg total) before bedtime.    spironolactone (ALDACTONE) 50 mg tablet 3 tablets (150 mg total) by Feeding Tube route daily Hold for SBP less than 90.    thiamine mononitrate, vit B1, 100 mg tablet 1 tablet (100 mg total) by Feeding Tube route daily    ursodiol (ACTIGALL) 60 mg/mL syrup 10 mL (600 mg total)  by Feeding Tube route 2 (two) times daily            Discharge With Opioid Medications for:  CHRONIC PAIN   According to our records, this patient was taking opioid medications before this hospitalization.  We do not plan to manage refill and the Primary Care Provider should manage refills.  Please evaluate with each refill of an opioid that your patient meets the criteria for intensified opioid therapy with the goal to avoid escalation of chronic opioid use. Please ensure the patient knows about alternatives for pain management and understands how to taper opioids.        Pending Tests       Outside Follow-up          Booked [place] Appointments  No future appointments.    Pending  Referrals  Discharge Referral to Hepatology     Case Management Services Arranged  Case Management Services Arranged: (all recorded)             Discharge Assessment  Condition at discharge:  fair              Covid-19 Vaccines       Name Date    [place] Covid Vaccine [place] Cables Top) 06/20/2021    Lot: [id number]    External: Auto Reconciled From Outside [place] Covid Vaccine Pearline Cables Top) 02/02/2021    Lot: [id number]    External: Auto Reconciled From Outside  [place] Sars-Cov-2 Vaccine (Purple Top) 08/14/2020    Manufacturer: Unknown Manufacturer    Lot: [id number]    External: Auto Reconciled From Outside [place] Sars-Cov-2 Vaccine ([place]) 03/18/2020    Manufacturer: Unknown Manufacturer    Lot: [id number]    External: Auto Reconciled From Outside [place] Sars-Cov-2 Vaccine ([place]) 02/26/2020    Manufacturer: Unknown Manufacturer    Lot: [id number]    External: Auto Reconciled From Outside [place] Documentation during this hospitalization:    Code Status: FULL    Last Orally Vader (Valid for this hospitalization only)       None          Advance directive, POLST, or Living Will Documents -- Patient Level:    Advance directive, POLST, or Living Will Documents: None found at the patient level.         Palliative Care Advance Care Planning Documentation             Noted       Advance Care Planning    Counseling regarding advanced care planning and goals of care 05/02/2022    Current Assessment & Plan 04/22/2022 Hospital Encounter Edited 05/02/2022  3:40 PM by Rico Sheehan, MD     Advance Care Planning   Date: 05/02/2022  Goals of Care and Treatment Preferences Discussion With patient, daughter/surrogate [name], son Frederico Hamman .  Goals of care were confirmed as: Life prolongation - Non-curative  Treatment preferences were addressed as follows: FULL CODE  Surrogate decision maker: [name] (daughter) Phone Number:   Other treatment preferences discussed: Lives in [place] with her husband, daughter [name], and son Frederico Hamman (who has developmental delays).  Says her children are her primary motivation for continuing to push forward despite the many setbacks in her care.  Wants to be there to help take care of them and to spend as much time with them  as possible.  Says that she will "never give up" even though the disease and its treatment are taking a significant toll on her physically and emotionally.   Gets strength from her faith and finds a lot of benefit with prayer.  Loves to crochet, do beadwork, and spend time by the [place] [facility] is her favorite place to visit).  [name] and Frederico Hamman are staying locally in an AirBnB to support their mother.  [name] notes that her mother seems to be doing better the past few days and that she hopes that they are "past the worst of it."    Recommendations:  - We will continue to follow for support.  Please let us know if any family meetings are planned as we would be happy to participate                    Advance Care Plan Note        ACP (Advance Care Planning) by [name], NP at 05/06/2022  1:31 PM       Author: [name], NP Service: Malignant Hematology Author Type: Nurse Practitioner    Filed: 05/07/2022  4:47 PM Date of Service: 05/06/2022  1:31 PM Status: Signed    Editor: [name], NP (Nurse Practitioner) Cosigner: Virl Diamond Carolynn Serve, MD at 05/07/2022  4:54 PM         DATE OF DISCUSSION: 05/06/22  Parties present: Garen Grams, Gray Bernhardt, Cleaster Corin, Kennon Rounds, MD  Patient designated surrogate decision-maker: daughter, [name]     DETAILS OF DISCUSSION :    Met with pt and family at bedside to discuss goals of care. Ariel Braun continued to express she wishes to continue to pursue full medical management for her ongoing medical problems. We discussed that she is presently not a candidate for treatment for multiple myeloma due to deconditioning. Her myeloma is presently stable, without treatment since 10/2021, but should it progress she would not be eligible for treatment and this would be life-threatening.     She expressed her desire to prolong her life so that she can spend time with family, and she agrees to engage with PT/OT to improve her performance status in hopes of eventually becoming eligible for treatment.     - Regarding life-sustaining therapy/escalation of care and at this time the patient wants all possible interventions  to be done to lengthen life including but not limited to ICU admission, intubation, chest compressions, mechanical ventilation and defibrillation        PREFERENCES FOR LIFE-SUSTAINING THERAPY:  Cardio-pulmonary resuscitation (CPR):  Yes  Intubation and mechanical ventilation:  Yes  Escalation of care:  Yes    POLST: COMPLETED:  No    Plan:  - We will update this note  as we continue to clarify and align with patient and family's goals of care.             Primary Care Physician  Eldridge Scot  Address: [address] [place] [address]   Phone: [phone]  Fax: [phone]     Outside Providers, for pending tests please use the following numbers:   For [facility] - Please Call: [phone]    For [facility] - Please Call: [phone]   For [facility] - Please Call: [phone]    Signed,  Isaiah Blakes, NP  05/27/2022          Discharge Instructions provided to the patient (  if any):    Discharge Instructions    An appointment request has been made for Video Visit with Dr. Hassell Done who will be your new provider since Dr. Jacelyn Grip has left.  At this time you are receiving rehabilitation services as a skilled nursing facility until you are strong enough to receive treatment.      When you get stronger, you can follow up at the [facility] outpatient Hematology & BMT clinic to re-establish with Dr. Hassell Done and continue lab monitoring, and as needed infusions.    You have also be receiving regular paracentesis due to fluid accumulation in your abdomen. You were seen by Hepatology ([facility]) and it was recommended to manage abdominal swelling with improved nutrition and diuretic therapy.  We have placed a 1 time referral to Interventional Radiology to have your abdomen drain of fluid due around 06/04/22.  If you need more of these procedures in the future, please coordinate with a provider.        If you are not called regarding scheduling an appointment  within 24 hours - please see instructions below.     Call clinic immediately for the following: [phone])  Fever greater than 38o C or 100.4o F and/or shaking chills   Shortness of breath   Severe cough   Bleeding that does not stop on its own  Nausea/vomiting that does not get better with nausea medications  Inability to take oral medications  3 or more loose or liquid stools per day  If you have fallen *(see below)  Concerns about your central line    Do NOT use My Chart to communicate any of these symptoms.    ALWAYS CALL 911 for emergencies, such as:  New or increase in chest pain  New or increase in shortness of breath  Changes in mental status, speech, ability to walk  *If you have fallen and hit your head, are bleeding, unable to walk or move extremities, or are in severe pain.    Do NOT use My Chart to communicate any of these symptoms.    General tips:   A balance of activities is important. Be sure to gradually increase your exercise, but avoid overexertion & exhaustion.  Hand-washing is important. Be sure that you and the others you live with wash their hands frequently and always before preparing food or after using the bathroom.  You can resume a regular diet unless instructed otherwise by your doctor or nurse practitioner.  Please arrive 1 hour before your scheduled MD, NP or PIC ([place]) appointment to have your labs drawn, unless you are told otherwise. This allows time for labs results to come in prior to your appointment.   Prescription refills can take time to process. Please request refills before discharge from the hospital, or from the clinic at least 1 week before you run out of your medication.  You should have your follow up visit scheduled within 24 hours (1 business day) of being discharged from the hospital. If you do not hear from our discharge coordinator within this time, please call our main line ([phone]), press #1, then #3 for an MD or NP appointment or  #4 for a PIC (infusion) appointment. You can ask to speak with the discharge coordinator when you reach a live person.           Patient Instructions    None

## 2022-05-27 NOTE — Plan of Care (Signed)
Problem: Discharge Planning - Adult  Goal: Knowledge of and participation in plan of care  Outcome: Adequate for Discharge     Problem: Fall, at Risk or Actual - Adult  Goal: Absence of falls and fall related injury  Outcome: Adequate for Discharge     Problem: Delirium - Adult / Pediatric  Goal: Absence or resolution of delirium  Outcome: Adequate for Discharge     Problem: Nutrition, Alteration in -Adult  Goal: Adequate nutritional intake  Outcome: Adequate for Discharge  Goal: Maximize nutritional intake per patient condition  Outcome: Adequate for Discharge     Problem: Pressure Injury,At Risk and Actual- Adult  Goal: Absence of any new pressure injury  Outcome: Adequate for Discharge  Goal: Pressure injury healing/stabilization  Outcome: Adequate for Discharge  Goal: Participation in preventative efforts and treatment plan (Patient/Family/Caregiver)  Outcome: Adequate for Discharge  Goal: Adequate nutritional intake  Outcome: Adequate for Discharge     Problem: GI Elimination Impaired - Adult  Goal: Passage of soft, formed stool  Outcome: Adequate for Discharge  Goal: Stool elimination per clinical condition ( e.g. GI ostomies )  Outcome: Adequate for Discharge

## 2022-05-27 NOTE — Other (Signed)
Pt discharging to SNF. Report called. Pt going with  [facility] at 1800. PIV removed prior to transfer.

## 2022-05-28 MED FILL — ANTI-DIARRHEAL (LOPERAMIDE) 2 MG TABLET: 2 mg | ORAL | Qty: 2

## 2022-05-28 MED FILL — SIMETHICONE 40 MG/0.6 ML ORAL DROPS,SUSPENSION: 40 mg/0.6 mL | ORAL | Qty: 2.4

## 2022-05-28 MED FILL — URSODIOL 60 MG/ML ORAL SYRUP: 60 mg/mL | ORAL | Qty: 10

## 2022-06-02 NOTE — Progress Notes (Deleted)
Patient Name:  Ariel Braun    DOB:  November 12, 1961    MEDICAL RECORD NUMBER [id number]     Date of Visit: 04/20/2022      Today I saw Ariel Braun in the [facility], in follow up regarding myeloma. She has been referred internally by Dr. Eliberto Ivory. I have reviewed her medical records from [facility] and will summarize them here for our records.     CHIEF COMPLAINT: Myeloma     SUMMARY OF HEMATOLOGIC HISTORY:  Ariel Braun is a 61 y.o. female who presents with myeloma. 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 M-spike 32, kappa 596.9 mg/L, IgG 4750, b2M 3.88. She was treated with VRd x4 (achieving VGPR), then MEL 200 ASCT 05/12/11, then observation alone. 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 She met criteria for PD on 02/19/2020 and was started on third-line DARA-Pd in June 2021.        INTERVAL HISTORY:  Seen today for hospital discharge evaluation. Admitted for persistent diarrhea. Found to have norovirus. Now under control and discharged to SNF.      - Started DARA-Pd 04/2020 (pom 67m 21/28 days starting 05/14/20, dara starting 05/16/2020). Chemo last given 10/16/21, held since due to acute illness, which she continues to deal with currently.      - Psychosocial stress - her son [name](who is intellectually disabled and has hyperparathyroidism, severe kidney stone, brain tumor), and fears of COVID. Has been busy for her due to her son, Ariel Braun's med issues. She is the sole caregiver to her husband (diabetes).     - Since 06/20/21, after getting covid vaccine, has been having  watery diarrhea episodically. With each vax she would have diarrhea, but usually stops after a few days.  With her last vaccine in August, the diarrhea persisted. Stool slightly formed while on dex, but returns to watery after a few days. Prior to august, no diarrhea with Pom. 10/04/21 stool studies + Norovirus.       [name] and finally discharged home on 04/16/22.  Hospital events include, CMV colitis, CMV viremia, norovirus, septic shock, acute liver injury and subsequent encephalopathy, malnutrition, pressure ulcer.         Has home health RN, PT, OT.   PCP discharge follow up tomorrow.   CMV stable, on letermorvir.  Valcyte caused cytopenias.  norovirus still +. On imodium daily. Having about 2 loose stools a day.  Continues to have nausea, stomach cramps. Has a PEG tube.  [facility]     Requires para about twice weekly. [facility]23[facility] Currently distended but not severely. Interested in lasix.     Quite weak and deconditioned.  Able to turn in bed, but not able to lift up to sitting or bear weight.      Healed sacral bed sore.     ALLERGIES:        Allergies/Contraindications   Allergen Reactions    Diphenoxylate-Atropine Abdominal Pain       Severe stomach pain     Caffeine Anxiety       "Jitters"         MEDICATIONS:         Current Outpatient Medications  Medication Sig Dispense Refill    0.9% sodium chloride flush syringe Inject 3 mL into the vein 2 (two) times daily as needed (line flush after each use)        acyclovir (ZOVIRAX) 400 mg tablet ON HOLD FOR NOW while on valganciclovir 180 tablet 3    albuterol (PROVENTIL) 2.5 mg /3 mL (0.083 %) inhalation solution Use 3 mL (2.5 mg total) by nebulization every 4 (four) hours as needed (wheezing/shortness of breath)        aluminum-magnesium hydroxide-simethicone (MAALOX PLUS) 200-200-20 mg/5 mL suspension Take 30 mL by mouth every 8 (eight) hours as needed for Indigestion (heartburn, bloating (1st))        cholecalciferol, vitamin D3, 1000 UNITS tablet Take 4 tablets (4,000 Units total) by mouth daily        clotrimazole (LOTRIMIN) 1 % cream Apply topically Twice a day Thighs and groin        dextrose 5 % and 0.9 % sodium  chloride infusion Inject 75 mL/hr into the vein Continuous        dicyclomine (BENTYL) 10 mg capsule Take 2 capsules (20 mg total) by mouth in the morning and 2 capsules (20 mg total) at noon and 2 capsules (20 mg total) in the evening and 2 capsules (20 mg total) before bedtime.        dry mouth oral rinse (BIOTENE DRY MOUTH) solution 5 mL by Mucous Membrane route in the morning and 5 mL at noon and 5 mL in the evening and 5 mL before bedtime.        HYDROmorphone (DILAUDID) 0.5 mg/0.5 mL injection Inject 0.3-0.8 mL (0.3-0.8 mg total) into the vein every 4 (four) hours as needed (first line pain)   0    hydrOXYzine (ATARAX) 10 mg tablet Take 1 tablet (10 mg total) by mouth every 8 (eight) hours as needed for Anxiety        loperamide (IMODIUM A-D) 2 mg tablet Take 2 tablets (4 mg total) by mouth 4 (four) times daily as needed for Diarrhea        melatonin 3 mg tablet Take 2 tablets (6 mg total) by mouth nightly at bedtime        multivitamin complete chewable (FLINTSTONE'S COMPLETE) chewable tablet Chew 1 tablet by mouth daily        ondansetron (ZOFRAN) 4 mg/2 mL injection Inject 4 mL (8 mg total) into the vein every 8 (eight) hours as needed (nausea/vomiting)        rifAXIMin (XIFAXAN) 550 mg tablet Take 1 tablet (550 mg total) by mouth 2 (two) times daily        simethicone (MYLICON) 80 mg chewable tablet Chew 1 tablet (80 mg total) by mouth 2 (two) times daily as needed (Bloating alone)        thiamine (VITAMIN B1) 100 mg/mL injection Inject 2 mL (200 mg total) into the vein Once a day 25 mL      ursodiol (ACTIGALL) 60 mg/mL syrup Take 10 mL (600 mg total) by mouth 2 (two) times daily        valGANciclovir (VALCYTE) 450 mg tablet Take 1 tablet (450 mg total) by mouth 2 (two) times daily        vitamin E, dl,tocopheryl acet, (VITAMIN E, DL, ACETATE,) 45 mg (100 unit) capsule Take 8 capsules (800 Units total) by mouth daily          No current facility-administered medications for this visit.  PAST MEDICAL  HISTORY:       Past Medical History:   Diagnosis Date    Acid reflux disease      Anxiety      Depression      Hereditary angioedema (CMS code)      Multiple myeloma, without mention of having achieved remission       IgG kappa    Myeloma (CMS code) 05/10/2011         PAST SURGICAL HISTORY:        Past Surgical History:   Procedure Laterality Date    AUTOLOGOUS STEM CELL TRANSPLANTATION   05/12/11    BREAST CYST EXCISION   1981   R breast surgery on 10/02/20 (path negative)     FAMILY HISTORY:         Family History   Problem Relation Name Age of Onset    Diabetes Mother        Hypertension Mother        Colon cancer Father        Hypertension Brother ##Brother1           SOCIAL HISTORY:  Social History           Socioeconomic History    Marital status: Married   Tobacco Use    Smoking status: Unknown   Substance and Sexual Activity    Sexual activity: Not Currently         PHYSICAL EXAM:  PHYSICAL EXAM VIA VIDEO VISIT:  ECOG Performance Status: 1 - Symptomatic but completely ambulatory   On VV, patient is laying in bed and speaking slowly, difficulty with word finding. Cannot complete sentences.   Vital Signs: No vitals  General:  In NAD, but appears weak and confused  Respiratory:  Normal breathing, No respiratory distress           LABORATORY DATA:          sPEP Interpretation   Date Value Ref Range Status   12/14/2021 Abnormal (A) Normal pattern. Final       Comment:       Hypoalbuminemia and hypogammaglobulinemia with a protein spike in the gamma region, consistent with a monoclonal protein, possibly daratumumab. Paraprotein spike = 0.2 g/dL.  Interpretation by Pathologist: Clelia Schaumann, M.D.               M-Protein (monoclonal), Serum   Date Value Ref Range Status   12/14/2021 0.2 (A) Negative g/dL Final            M-Protein/sPEP - External   Date Value Ref Range Status   08/04/2020 0.8   Final              Immunofixation Electrophoresis, serum   Date Value Ref Range Status   12/14/2021 IgG Kappa (A)  Negative Final       Comment:       Paraprotein present, IgG Kappa. The IgG Kappa band is migrating in a region where daratumumab is known to localize on immunofixation.  If this patient is receiving daratumumab treatment, it is possible that the IgG Kappa band represents daratumumab   rather than an endogenous monoclonal protein.  Interpretation by Pathologist: Clelia Schaumann, M.D.               Lab Results   Component Value Date     IgA, Serum 20 (L) 10/13/2021     IgA - External 12 08/04/2020     IgG 585 (L) 10/13/2021  IgG, serum 745 01/01/2022     IgG - External 989 08/04/2020     IgM, Serum 7 (L) 10/13/2021     IgM - External 14 08/04/2020     Kappa (Serum, Free) - External 70.6 08/04/2020     Kappa Light Chain, Serum, Free 16.6 12/14/2021     Lambda (Serum, Free) - External 3.9 08/04/2020     Lambda Light Chain, Serum, Free 2.7 (L) 12/14/2021     Kappa/Lambda Ratio, Serum, Free 6.15 (H) 12/14/2021            PATHOLOGY:  As noted above.     RADIOLOGY:  As noted above.     ASSESSMENT &PLAN  The patient is a 61 y.o. female who presents for evaluation of myeloma.     # IgG kappa MM: Prior lines of therapy include (1) VRd --> MEL ASCT --> observation, (2) Rev --> Rd, and now (3) DARA-Pom/dex started 04/2020 for M-spike rising to 1.1. Now down to 0.8 as of 07/07/2020, although some of this may be DARA interference of course. Had brief increase in [facility] around time of breast surgery 09/2020 (when surgery was on hold), but then resumed thereafter. 02/2021 SPEP M protein and SFLC downtrending and 08/18/2021 MM labs stable   - Patient has history of allergic reactions and HAE (was monitored 1-3 hrs after first doses, got Singulair pre-med initially) but no issues so far with DARA.   - Continue to HOLD DARA-Pd due to ongoing Norovirus and persistent diarrhea and poor performance status  DARA once a month (last had this 1st week of Dec)  Pom 32m daily, 21 of 28 days (HOLD)  Dex once weekly, 21 of 28 days (HOLD)- we  talked about stopping the dex but the pt wants to continue for now as it helps with the diarrhea  - no recent labs  - Continue monthly MM labs  - next line will consider CFZ/seli/dex but will need to discuss if seli AEs would be ok with her      #FTT: patient's performance status had been declining for 2-3 months prior to ED admission. Today 04/20/22, patient recently discharge home, laying is hospital bed.  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  01/13/22 CMV 176 international units/ml prior to discharge from [facility]. Inpatient, treated with Ganciclovir IV, IVIG x1, and then discharged from [place] on Valcyte 4551mPO BID.  - 04/03/22 CMV PCR <200 (wnl) [facility]- Per patient's daughter, valcyte switched to letermovir due to count suppression with valcyte  - unclear if she still needs ppx at this point  - ID consult on admission  - check CMV PCR on admission and monitor weekly     #Norovirus: Patient first noted diarrhea after covid vaccinations. Not helped with prevalite, but imodium helped. After last covid vaccine 06/2021, she has had increase diarrhea. 08/27/21 CDiff and O+P negative, but + for norovirus.  [facility] for persistent diarrhea, FTT, malnutrition and new encelphalopathy.  Stool study last completed at [facility], showing persistent Norovirus.  - continues on supportive care  - nutrition via  PEG  - GI consult upon admission  - patient wants to discuss with [facility] re: fecal transplant due to persistent Norovirus      #Liver failure:  New AMS/encephalopathy noted during 12/02/21 VV.  Patient advised to go to ED.   Taken by ambulance to [facility] 12/02/21 and admitted for an extended period of time. On admission, T bili 5.4, Alk phos 1314, AST/ALT 152/130.  At worse T bili 12.1, alk phos 958, ast/alt 118/150 on 12/09/21. Hepatology consulted at [place], liver bx showed steatohepatitis with pericentral/sinusoidal and periportal fibrosis; possibly drug related (Pom?).  LFTs much improved. On 04/15/22, prior to d/c from [facility], T bili 0.7, alk phos 230, ast/alt 72/34.  - on ursodiol  - continue to monitor     #Ascites: has been requiring paracentesis twice weekly.  Last had para on 04/13/22 prior to discharge.  Outpatient para scheduled for 04/27/22 locally, but she is starting to feel distended and uncomfortable.  - ED for para     # Anxiety, emotional distress: Causes of stress include her role as sole caregiver to her son (intellectually disabled, severe hyperparathyroidism), husband (diabetes), and herself. Also stressed about the myeloma itself and its prognosis, and also about COVID.   - follow up SMS    - Can consider oncopsychology referral down the line     # R breast lesion: Noted on 12/2019 PET-CT, then confirmed on 04/2020 R breast US. She got the breast biopsy 07/08/2020 showed papillary lesion, recommending excisional biopsy which returned as benign, per patient   - follow up Dr. Iona Coach (local oncologist) and Dr. Maudie Mercury (surgeon)     # Hereditary angioedema:   - Follows with allergist locally   - No issues with DARA-Pd     # History of PEs: Occurred while on Revlimid.   - Continue low-dose Eliquis indefinitely, particularly while on Pom     #Bone health  - if she did not finish 2 years of zometa, then would recommend finishing a total of 2 years AFTER she finishes the dental work she needs currently     [facility]  - follow up with PCP re: mammo, colonoscopy, vaccines     #Ppx:  - On letermovir for CMV  - s/p [place] #1 02/26/20, #2 03/18/20, #3 08/14/20, #4 02/02/21, #5 06/20/2021  - recommend bivalent covid booster  - recommend yearly flu  vaccine  - 11/06/20 covid ab neg  - repeat covid Ab 03/03/21 was neg   - s/p evusheld 04/01/21     This is an independent service.   The available consultant for this service is Harrietta Guardian, MD.             Signed by Reymundo Poll, PA-C  06/02/2022    {MDM list HEMONC (optional):[id number]}

## 2022-06-04 LAB — BACTERIAL CULTURE, STERILE SITES, WITH GRAM STAIN
Bacterial Culture, Sterile Sites: NO GROWTH
Gram Stain: NONE SEEN

## 2022-06-11 ENCOUNTER — Telehealth: Admit: 2022-06-11 | Payer: PRIVATE HEALTH INSURANCE | Attending: Nurse Practitioner

## 2022-06-11 DIAGNOSIS — C9000 Multiple myeloma not having achieved remission: Secondary | ICD-10-CM

## 2022-06-11 NOTE — Patient Instructions (Signed)
Please use my standing order for monthly labs    Continue to focus on recovery, increasing oral intake, and increasing strength    Continue letermovir, rifaximan, and ursodiol    Follow up with Izora Gala in 1 month and Dr. Hassell Done in 2 months via telehealth

## 2022-06-11 NOTE — Progress Notes (Signed)
This is an independent service.   The available consultant for this service is Harrietta Guardian, MD.                         Patient Name:  Ariel Braun    DOB:  1961-01-23    MEDICAL RECORD NUMBER [id number]     Date of Visit: 04/20/2022      Today I saw Ariel Braun in the [facility], in follow up regarding myeloma. She has been referred internally by Dr. Eliberto Ivory. I have reviewed her medical records from [place] and will summarize them here for our records.     CHIEF COMPLAINT: Myeloma     SUMMARY OF HEMATOLOGIC HISTORY:  Ariel Braun is a 61 y.o. female who presents with myeloma. 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 M-spike 32, kappa 596.9 mg/L, IgG 4750, b2M 3.88. She was treated with VRd x4 (achieving VGPR), then MEL 200 ASCT 05/12/11, then observation alone. 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 She met criteria for PD on 02/19/2020 and was started on third-line DARA-Pd in June 2021.        INTERVAL HISTORY:      - Started DARA-Pd 04/2020 (pom 60m 21/28 days starting 05/14/20, dara starting 05/16/2020). Chemo last given 10/16/21, held since due to acute illness, which she continues to deal with currently.      - Psychosocial stress - her son Ariel Braun(who is intellectually disabled and has hyperparathyroidism, severe kidney stone, brain tumor), and fears of COVID. Has been busy for her due to her son, Ariel Braun's med issues. She is the sole caregiver to her husband (diabetes).     - Since 06/20/21, after getting covid vaccine, has been having  watery diarrhea episodically. With each vax she would have diarrhea, but usually stops after a few days.  With her last vaccine in August, the diarrhea persisted. Stool slightly formed while on dex, but returns to watery after a few days. Prior to august, no diarrhea with Pom. 10/04/21 stool studies + Norovirus.       [name] and finally discharged home on 04/16/22.  Hospital events include, CMV colitis, CMV viremia, norovirus, septic shock, acute liver injury and subsequent encephalopathy, malnutrition, pressure ulcer.         Has home health RN, PT, OT.   PCP discharge follow up tomorrow.   CMV stable, on letermorvir.  Valcyte caused cytopenias.  norovirus still +. On imodium daily. Having about 2 loose stools a day.  Continues to have nausea, stomach cramps. Has a PEG tube.  [facility]     Requires para about twice weekly. [facility] Currently distended but not severely. Interested in lasix.     Quite weak and deconditioned.  Able to turn in bed, but not able to lift up to sitting or bear weight.      Healed sacral bed sore.     [facility] work up of persistent diarrhea and severe deconditioning.  Discharged to SNF ([facility]    Presents for telehealth visit with her daughter JJanett Braun    PT/OT few times a week.  Continues to get supplemental nutrition, will be changing formula soon due to shortage of current one.  Able to eat solid foods, eats more at lunch and dinner.  Not an egg person.      Abdomen not currently distended, last para 2 weeks, not needed on since discharge.    Legs are still very weak.  Started to transfer     Has soft stools about twice a day and imodium twice daily.     No recent infection.    ALLERGIES:        Allergies/Contraindications   Allergen Reactions    Diphenoxylate-Atropine Abdominal Pain       Severe stomach pain     Caffeine Anxiety       "Jitters"         MEDICATIONS:         Current Outpatient Medications   Medication Sig Dispense Refill    0.9% sodium chloride flush syringe Inject 3 mL into the vein 2 (two) times daily as needed (line flush after each use)        acyclovir (ZOVIRAX) 400 mg tablet ON HOLD FOR NOW  while on valganciclovir 180 tablet 3    albuterol (PROVENTIL) 2.5 mg /3 mL (0.083 %) inhalation solution Use 3 mL (2.5 mg total) by nebulization every 4 (four) hours as needed (wheezing/shortness of breath)        aluminum-magnesium hydroxide-simethicone (MAALOX PLUS) 200-200-20 mg/5 mL suspension Take 30 mL by mouth every 8 (eight) hours as needed for Indigestion (heartburn, bloating (1st))        cholecalciferol, vitamin D3, 1000 UNITS tablet Take 4 tablets (4,000 Units total) by mouth daily        clotrimazole (LOTRIMIN) 1 % cream Apply topically Twice a day Thighs and groin        dextrose 5 % and 0.9 % sodium chloride infusion Inject 75 mL/hr into the vein Continuous        dicyclomine (BENTYL) 10 mg capsule Take 2 capsules (20 mg total) by mouth in the morning and 2 capsules (20 mg total) at noon and 2 capsules (20 mg total) in the evening and 2 capsules (20 mg total) before bedtime.        dry mouth oral rinse (BIOTENE DRY MOUTH) solution 5 mL by Mucous Membrane route in the morning and 5 mL at noon and 5 mL in the evening and 5 mL before bedtime.        HYDROmorphone (DILAUDID) 0.5 mg/0.5 mL injection Inject 0.3-0.8 mL (0.3-0.8 mg total) into the vein every 4 (four) hours as needed (first line pain)   0    hydrOXYzine (ATARAX) 10 mg tablet Take 1 tablet (10 mg total) by mouth every 8 (eight) hours as needed for Anxiety        loperamide (IMODIUM A-D) 2 mg tablet Take 2 tablets (4 mg total) by mouth 4 (four) times daily as needed for Diarrhea        melatonin 3 mg tablet Take 2 tablets (6 mg total) by mouth nightly at bedtime        multivitamin complete chewable (FLINTSTONE'S COMPLETE) chewable tablet Chew 1 tablet by mouth daily        ondansetron (ZOFRAN) 4 mg/2 mL injection Inject 4 mL (8 mg total) into the vein every 8 (eight) hours as needed (nausea/vomiting)        rifAXIMin (XIFAXAN) 550 mg tablet Take 1 tablet (550 mg total) by mouth 2 (two) times daily  simethicone (MYLICON) 80 mg chewable  tablet Chew 1 tablet (80 mg total) by mouth 2 (two) times daily as needed (Bloating alone)        thiamine (VITAMIN B1) 100 mg/mL injection Inject 2 mL (200 mg total) into the vein Once a day 25 mL      ursodiol (ACTIGALL) 60 mg/mL syrup Take 10 mL (600 mg total) by mouth 2 (two) times daily        valGANciclovir (VALCYTE) 450 mg tablet Take 1 tablet (450 mg total) by mouth 2 (two) times daily        vitamin E, dl,tocopheryl acet, (VITAMIN E, DL, ACETATE,) 45 mg (100 unit) capsule Take 8 capsules (800 Units total) by mouth daily          No current facility-administered medications for this visit.         PAST MEDICAL HISTORY:       Past Medical History:   Diagnosis Date    Acid reflux disease      Anxiety      Depression      Hereditary angioedema (CMS code)      Multiple myeloma, without mention of having achieved remission       IgG kappa    Myeloma (CMS code) 05/10/2011         PAST SURGICAL HISTORY:        Past Surgical History:   Procedure Laterality Date    AUTOLOGOUS STEM CELL TRANSPLANTATION   05/12/11    BREAST CYST EXCISION   1981   R breast surgery on 10/02/20 (path negative)     FAMILY HISTORY:         Family History   Problem Relation Name Age of Onset    Diabetes Mother        Hypertension Mother        Colon cancer Father        Hypertension Brother ##Brother1           SOCIAL HISTORY:  Social History           Socioeconomic History    Marital status: Married   Tobacco Use    Smoking status: Unknown   Substance and Sexual Activity    Sexual activity: Not Currently         PHYSICAL EXAM:  PHYSICAL EXAM VIA VIDEO VISIT:  ECOG Performance Status: 1 - Symptomatic but completely ambulatory   On VV, patient is laying in bed and speaking slowly, difficulty with word finding. Cannot complete sentences.   Vital Signs: No vitals  General:  In NAD, but appears weak and confused  Respiratory:  Normal breathing, No respiratory distress           LABORATORY DATA:          sPEP Interpretation   Date Value Ref Range  Status   12/14/2021 Abnormal (A) Normal pattern. Final       Comment:       Hypoalbuminemia and hypogammaglobulinemia with a protein spike in the gamma region, consistent with a monoclonal protein, possibly daratumumab. Paraprotein spike = 0.2 g/dL.  Interpretation by Pathologist: Clelia Schaumann, M.D.               M-Protein (monoclonal), Serum   Date Value Ref Range Status   12/14/2021 0.2 (A) Negative g/dL Final            M-Protein/sPEP - External   Date Value Ref Range Status   08/04/2020 0.8   Final  Immunofixation Electrophoresis, serum   Date Value Ref Range Status   12/14/2021 IgG Kappa (A) Negative Final       Comment:       Paraprotein present, IgG Kappa. The IgG Kappa band is migrating in a region where daratumumab is known to localize on immunofixation.  If this patient is receiving daratumumab treatment, it is possible that the IgG Kappa band represents daratumumab   rather than an endogenous monoclonal protein.  Interpretation by Pathologist: Clelia Schaumann, M.D.               Lab Results   Component Value Date     IgA, Serum 20 (L) 10/13/2021     IgA - External 12 08/04/2020     IgG 585 (L) 10/13/2021     IgG, serum 745 01/01/2022     IgG - External 989 08/04/2020     IgM, Serum 7 (L) 10/13/2021     IgM - External 14 08/04/2020     Kappa (Serum, Free) - External 70.6 08/04/2020     Kappa Light Chain, Serum, Free 16.6 12/14/2021     Lambda (Serum, Free) - External 3.9 08/04/2020     Lambda Light Chain, Serum, Free 2.7 (L) 12/14/2021     Kappa/Lambda Ratio, Serum, Free 6.15 (H) 12/14/2021            PATHOLOGY:  As noted above.     RADIOLOGY:  As noted above.     ASSESSMENT &PLAN  The patient is a 61 y.o. female who presents for evaluation of myeloma.     # IgG kappa MM: Prior lines of therapy include (1) VRd --> MEL ASCT --> observation, (2) Rev --> Rd, and now (3) DARA-Pom/dex started 04/2020 for M-spike rising to 1.1. Now down to 0.8 as of 07/07/2020, although some of this may be DARA  interference of course. Had brief increase in [facility] around time of breast surgery 09/2020 (when surgery was on hold), but then resumed thereafter. 02/2021 SPEP M protein and SFLC downtrending and 08/18/2021 MM labs stable   - Patient has history of allergic reactions and HAE (was monitored 1-3 hrs after first doses, got Singulair pre-med initially) but no issues so far with DARA.   - Continue to HOLD DARA-Pd due to recent prolonged hospitalization for multiple infections and liver failure.  Continue to recover and regain strength.  DARA once a month (HOLD, last had this 1st week of Dec 2022)  Pom 14m daily, 21 of 28 days (HOLD)  Dex once weekly, 21 of 28 days (HOLD)  - no recent labs  - Continue monthly MM labs  - next line will consider CFZ/seli/dex but will need to discuss if seli AEs would be ok with her      #Poor performance status: patient's performance status had been declining for 2-3 months prior to ED admission. In early 04/2022, patient was discharged home but still debilitated and laying in hospital bed and minimally conversant.  [facility], even recalling accurately past events.  - Plan to remain at SNF and continue PT/OT  - Resuming myeloma therapy will depend on improvement of functional status.  Ideally she should be able to move around with a walker or cane and no longer requiring tube feeds.    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  01/13/22 CMV 176 international units/ml prior to discharge from [place]. Inpatient, treated with Ganciclovir IV, IVIG x1,  and then discharged from [place] on Valcyte 472m PO BID.  04/03/22 CMV PCR <200 (wnl) [name]  05/25/22 CMV PCR at [place] detectable <30.  - Continue letermovir indefinitely (switched from valcyte due to count suppression)  - check CMV PCR monthly     #Norovirus: Patient first noted diarrhea after covid vaccinations. Not helped with prevalite, but imodium helped. After last  covid vaccine 06/2021, she has had increase diarrhea. 08/27/21 CDiff and O+P negative, but + for norovirus.  [facility] for persistent diarrhea, FTT, malnutrition and new encelphalopathy.  Stool [facility] last [name], showing persistent Norovirus.  04/23/22 repeat GI viral PCR negative for Noro.  - continues on supportive care  - nutrition via PEG  - Norovirus negative as for 04/23/22     #Liver failure:  New AMS/encephalopathy noted during 12/02/21 VV.  Patient advised to go to ED.  [name] and admitted for an extended period of time. On admission, T bili 5.4, Alk phos 1314, AST/ALT 152/130.  At worse T bili 12.1, alk phos 958, ast/alt 118/150 on 12/09/21. Hepatology consulted at [place], liver bx showed steatohepatitis with pericentral/sinusoidal and periportal fibrosis; possibly drug related (Pom?). On 04/15/22, prior to d/c from [name] T bili 0.7, alk phos 230, ast/alt 72/34.  LFTs improved and stable.   - on ursodiol  - continue to monitor     #Ascites: has been requiring paracentesis twice weekly.  [facility] Has not needed another since, abdomen distention much improved and stable.  - paracentesis PRN [name]    # Anxiety, emotional distress: Causes of stress include her role as sole caregiver to her son (intellectually disabled, severe hyperparathyroidism), husband (diabetes), and herself. Also stressed about the myeloma itself and its prognosis, and also about COVID.   - follow up SMS    - Can consider oncopsychology referral down the line     # R breast lesion: Noted on 12/2019 PET-CT, then confirmed on 04/2020 R breast [place] She got the breast biopsy 07/08/2020 showed papillary lesion, recommending excisional biopsy which returned as benign, per patient   - follow up Dr. JIona Coach(local oncologist) and Dr. KMaudie Mercury(surgeon)     # Hereditary angioedema:   - Follows with allergist locally   - No issues with  DARA-Pd     # History of PEs: Occurred while on Revlimid.   - Continue low-dose Eliquis indefinitely, particularly while on Pom     #Bone health  - if she did not finish 2 years of zometa, then would recommend finishing a total of 2 years AFTER she finishes the dental work she needs currently     #[name] - follow up with PCP re: mammo, colonoscopy, vaccines     #Ppx:  - On letermovir for CMV  - s/p PNardin#1 02/26/20, #2 03/18/20, #3 08/14/20, #4 02/02/21, #5 06/20/2021  - recommend bivalent covid booster  - recommend yearly flu vaccine  - 11/06/20 covid ab neg  - repeat covid Ab 03/03/21 was neg   - s/p evusheld 04/01/21     #Follow-up:  1 month with NP or Dr. MHassell Done       Thank you for involving me in the care of your patient. Please do not hesitate to call me anytime to discuss this patient.     NKathrynn RunningNP     Patient  Care Team:  Eldridge Scot, DO as PCP - General (Family Medicine)  Trellis Paganini, MD (Hematology and Oncology)  Generic Provider Mychart  Sima Matas, MD ([place])  Ferdinand Cava III, MD (Hematology)  Dr. Herbert Moors (Surgeon)       I, Azucena Cecil, NP, spent a total of 60 minutes on this patient's care on the day of their visit excluding time spent related to any billed procedures. This time includes time spent with the patient as well as time spent documenting in the medical record, reviewing patient's records and tests, obtaining history, placing orders, communicating with other healthcare professionals, counseling the patient, family, or caregiver, and/or care coordination for the diagnoses above.       This patient has a high complexity problem: multiple myeloma, which is an incurable malignancy and poses a threat to life and bodily function.   Patient is at high risk of disease progression and must be closely monitored.     Regarding data complexity, I have reviewed the following tests: CBC, CMP, SPEP, light chains     Medical decision making: high  risk.  Continue hold myeloma therapy due to recent severe illness and prolonged hospitalization.  Patient with recent multiple infections, liver failure, and remains in SNF for rehab.    The patient was extensively counseled on this and agrees to continue with the treatment plan.         I performed this evaluation using real-time telehealth tools, including a live video Zoom connection between my location and the patient's location. Prior to initiating, the patient consented to perform this evaluation using telehealth tools.

## 2022-06-30 LAB — PLATELETS - EXTERNAL: Platelets - External: 185

## 2022-06-30 LAB — ALT - EXTERNAL: ALT - External: 12

## 2022-06-30 LAB — RBC - EXTERNAL: RBC - External: 3.39

## 2022-06-30 LAB — HEMOGLOBIN - EXTERNAL: Hemoglobin - External: 10.1

## 2022-06-30 LAB — EOSINOPHIL ABS COUNT - EXTERNAL: Eosinophil Abs Count - External: 0.1

## 2022-06-30 LAB — HEMATOCRIT - EXTERNAL: Hematocrit - External: 30.8

## 2022-06-30 LAB — CREATININE, SERUM - EXTERNAL: Creatinine, Serum - External: 0.79

## 2022-06-30 LAB — BUN - EXTERNAL: BUN - External: 23

## 2022-06-30 LAB — POTASSIUM - EXTERNAL: Potassium - External: 3.8

## 2022-06-30 LAB — TOTAL PROTEIN, SERUM - EXTERNAL: Total Protein, Serum - External: 5.1

## 2022-06-30 LAB — WBC - EXTERNAL: WBC - External: 4.7

## 2022-06-30 LAB — ALKALINE PHOSPHATASE - EXTERNAL: Alkaline Phosphatase - External: 87

## 2022-06-30 LAB — BILIRUBIN, DIRECT - EXTERNAL: Bilirubin, Direct - External: 0.11

## 2022-06-30 LAB — CALCIUM - EXTERNAL: Calcium - External: 8.5

## 2022-06-30 LAB — MCV - EXTERNAL: MCV - External: 91

## 2022-06-30 LAB — ALC (ABSOLUTE LYMPHOCYTE COUNT) - EXTERNAL: Abs Lymphocytes (External): 1.3

## 2022-06-30 LAB — SODIUM - EXTERNAL: Sodium - External: 137

## 2022-06-30 LAB — BILIRUBIN, TOTAL - EXTERNAL: Bilirubin, Total - External: 0.5

## 2022-06-30 LAB — MONOCYTE ABS COUNT - EXTERNAL: Monocyte Abs Count - External: 0.6

## 2022-06-30 LAB — AST - EXTERNAL: AST - External: 22

## 2022-06-30 LAB — EXTERNAL RESULT REPORT SCANNED (YES/NO)?

## 2022-06-30 LAB — ANC (ABSOLUTE NEUTROPHIL COUNT) - EXTERNAL: ANC (Absolute Neutrophil Count) - External: 2.5

## 2022-06-30 LAB — ALBUMIN, SERUM - EXTERNAL: Albumin, Serum - External: 2.8 g/dL — AB (ref 3.5–4.8)

## 2022-07-02 LAB — BUN - EXTERNAL: BUN - External: 20

## 2022-07-02 LAB — PLATELETS - EXTERNAL: Platelets - External: 165

## 2022-07-02 LAB — HEMATOCRIT - EXTERNAL: Hematocrit - External: 30.2

## 2022-07-02 LAB — EOSINOPHIL ABS COUNT - EXTERNAL: Eosinophil Abs Count - External: 0.1

## 2022-07-02 LAB — ALBUMIN, SERUM - EXTERNAL: Albumin, Serum - External: 2.8 g/dL — AB (ref 3.5–4.8)

## 2022-07-02 LAB — AST - EXTERNAL: AST - External: 21

## 2022-07-02 LAB — WBC - EXTERNAL: WBC - External: 4

## 2022-07-02 LAB — ALKALINE PHOSPHATASE - EXTERNAL: Alkaline Phosphatase - External: 103

## 2022-07-02 LAB — KAPPA (SERUM, FREE) - EXTERNAL: Kappa (Serum, Free) - External: 79.6

## 2022-07-02 LAB — CREATININE, SERUM - EXTERNAL: Creatinine, Serum - External: 0.61

## 2022-07-02 LAB — LAMBDA (SERUM, FREE) - EXTERNAL: Lambda (Serum, Free) - External: 17

## 2022-07-02 LAB — POTASSIUM - EXTERNAL: Potassium - External: 3.9

## 2022-07-02 LAB — BILIRUBIN, TOTAL - EXTERNAL: Bilirubin, Total - External: 0.5

## 2022-07-02 LAB — HEMOGLOBIN - EXTERNAL: Hemoglobin - External: 9.9

## 2022-07-02 LAB — SODIUM - EXTERNAL: Sodium - External: 138

## 2022-07-02 LAB — CALCIUM - EXTERNAL: Calcium - External: 8.3

## 2022-07-02 LAB — MONOCYTE ABS COUNT - EXTERNAL: Monocyte Abs Count - External: 0.5

## 2022-07-02 LAB — ALT - EXTERNAL: ALT - External: 10

## 2022-07-02 LAB — ALC (ABSOLUTE LYMPHOCYTE COUNT) - EXTERNAL: Abs Lymphocytes (External): 1.2

## 2022-07-02 LAB — RBC - EXTERNAL: RBC - External: 3.33

## 2022-07-02 LAB — MCV - EXTERNAL: MCV - External: 91

## 2022-07-02 LAB — ANC (ABSOLUTE NEUTROPHIL COUNT) - EXTERNAL: ANC (Absolute Neutrophil Count) - External: 2.2

## 2022-07-03 LAB — EXTERNAL RESULT REPORT SCANNED (YES/NO)?

## 2022-07-06 ENCOUNTER — Telehealth: Admit: 2022-07-14 | Discharge: 2022-07-14 | Payer: PRIVATE HEALTH INSURANCE | Attending: Nurse Practitioner

## 2022-07-06 DIAGNOSIS — C9000 Multiple myeloma not having achieved remission: Secondary | ICD-10-CM

## 2022-07-06 NOTE — Patient Instructions (Addendum)
Ok to get [id number] (pneumonia vaccine)    Labs monthly (next due around 08/02/22, then again around 09/01/22) - please make sure they draw the full panel)  Serum protein electrophoresis (need to monitor M protein)  Serum immunofixation electrophoresis  Serum free light chains  IgG  IgA  IgM  Complete blood count  Comprehensive metabolic panel  CMV PCR (they drew CMV IgM last time which is not the same)    Follow up with Dr. Hassell Done 09/09/22

## 2022-07-06 NOTE — Progress Notes (Unsigned)
This is an independent service.   The available consultant for this service is Shaune Pascal, MD.                         Patient Name:  Ariel Braun    DOB:  1961-01-10    MEDICAL RECORD NUMBER [id number]     Date of Visit: 04/20/2022      Today I saw Ariel Braun in the [facility], in follow up regarding myeloma. She has been referred internally by Dr. Sheppard Penton. I have reviewed her medical records from Epic and will summarize them here for our records.     CHIEF COMPLAINT: Myeloma     SUMMARY OF HEMATOLOGIC HISTORY:  Ariel Braun is a 61 y.o. female who presents with myeloma. 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 M-spike 32, kappa 596.9 mg/L, IgG 4750, b2M 3.88. She was treated with VRd x4 (achieving VGPR), then MEL 200 ASCT 05/12/11, then observation alone. 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 She met criteria for PD on 02/19/2020 and was started on third-line DARA-Pd in June 2021.        INTERVAL HISTORY:      - Started DARA-Pd 04/2020 (pom 2mg  21/28 days starting 8/41/66, dara starting 05/16/2020). Chemo last given 10/16/21, held since due to acute illness, which she continues to deal with currently.      - Psychosocial stress - her son Ariel Braun (who is intellectually disabled and has hyperparathyroidism, severe kidney stone, brain tumor), and fears of COVID. Has been busy for her due to her son, Ariel Braun's med issues. She is the sole caregiver to her husband (diabetes).     - Since 06/20/21, after getting covid vaccine, has been having  watery diarrhea episodically. With each vax she would have diarrhea, but usually stops after a few days.  With her last vaccine in August, the diarrhea persisted. Stool slightly formed while on dex, but returns to watery after a few days. Prior to august, no diarrhea with Pom. 10/04/21 stool studies + Norovirus.       She has been hospitalized at [facility] since 12/02/21, transferred to [facility]  12/09/21, then transferred back to [facility] 01/15/22 and finally discharged home on 04/16/22.  Hospital events include, CMV colitis, CMV viremia, norovirus, septic shock, acute liver injury and subsequent encephalopathy, malnutrition, pressure ulcer.         Has home health RN, PT, OT.   PCP discharge follow up tomorrow.   CMV stable, on letermorvir.  Valcyte caused cytopenias.  norovirus still +. On imodium daily. Having about 2 loose stools a day.  Continues to have nausea, stomach cramps. Has a PEG tube.  Prefers care at [facility]. Losing trust in local hospital.     Requires para about twice weekly. Last on 5/30/23Next scheduled 6/13 at [facility]. Currently distended but not severely. Interested in lasix.     Quite weak and deconditioned.  Able to turn in bed, but not able to lift up to sitting or bear weight.      Healed sacral bed sore.     Admitted to [facility] 04/22/22 - 05/27/22 for work up of persistent diarrhea and severe deconditioning.  Discharged to SNF [facility]).    [facility] post acute    Presents for telehealth visit with her daughter Ariel Braun.  PT/OT few times a week.  Continues to get supplemental nutrition, will be changing formula soon due to shortage of current one.  Able to eat solid foods, eats more at lunch and dinner.  Not an egg person.  Tube feedings on hold. Stomach cramps improved on solids. No nausea.     Abdomen not currently distended, last para 2 weeks, not needed on since discharge.    Legs are still very weak.  Started to transfer     Has soft stools about twice a day and imodium 3x daily but hold for constipation.    No recent infection.    Able to walk using parallel.  Still needs help with transferring. Mostly in bed, but in chair during PT/OT.    Uses adult briefs. No recent infection.      ALLERGIES:        Allergies/Contraindications   Allergen Reactions    Diphenoxylate-Atropine Abdominal Pain       Severe stomach pain     Caffeine Anxiety       "Jitters"          MEDICATIONS:         Current Outpatient Medications   Medication Sig Dispense Refill    0.9% sodium chloride flush syringe Inject 3 mL into the vein 2 (two) times daily as needed (line flush after each use)        acyclovir (ZOVIRAX) 400 mg tablet ON HOLD FOR NOW while on valganciclovir 180 tablet 3    albuterol (PROVENTIL) 2.5 mg /3 mL (0.083 %) inhalation solution Use 3 mL (2.5 mg total) by nebulization every 4 (four) hours as needed (wheezing/shortness of breath)        aluminum-magnesium hydroxide-simethicone (MAALOX PLUS) 200-200-20 mg/5 mL suspension Take 30 mL by mouth every 8 (eight) hours as needed for Indigestion (heartburn, bloating (1st))        cholecalciferol, vitamin D3, 1000 UNITS tablet Take 4 tablets (4,000 Units total) by mouth daily        clotrimazole (LOTRIMIN) 1 % cream Apply topically Twice a day Thighs and groin        dextrose 5 % and 0.9 % sodium chloride infusion Inject 75 mL/hr into the vein Continuous        dicyclomine (BENTYL) 10 mg capsule Take 2 capsules (20 mg total) by mouth in the morning and 2 capsules (20 mg total) at noon and 2 capsules (20 mg total) in the evening and 2 capsules (20 mg total) before bedtime.        dry mouth oral rinse (BIOTENE DRY MOUTH) solution 5 mL by Mucous Membrane route in the morning and 5 mL at noon and 5 mL in the evening and 5 mL before bedtime.        HYDROmorphone (DILAUDID) 0.5 mg/0.5 mL injection Inject 0.3-0.8 mL (0.3-0.8 mg total) into the vein every 4 (four) hours as needed (first line pain)   0    hydrOXYzine (ATARAX) 10 mg tablet Take 1 tablet (10 mg total) by mouth every 8 (eight) hours as needed for Anxiety        loperamide (IMODIUM A-D) 2 mg tablet Take 2 tablets (4 mg total) by mouth 4 (four) times daily as needed for Diarrhea        melatonin 3 mg tablet Take 2 tablets (6 mg total) by mouth nightly at bedtime        multivitamin complete chewable (FLINTSTONE'S COMPLETE) chewable tablet Chew 1 tablet by mouth daily  ondansetron (ZOFRAN) 4 mg/2 mL injection Inject 4 mL (8 mg total) into the vein every 8 (eight) hours as needed (nausea/vomiting)        rifAXIMin (XIFAXAN) 550 mg tablet Take 1 tablet (550 mg total) by mouth 2 (two) times daily        simethicone (MYLICON) 80 mg chewable tablet Chew 1 tablet (80 mg total) by mouth 2 (two) times daily as needed (Bloating alone)        thiamine (VITAMIN B1) 100 mg/mL injection Inject 2 mL (200 mg total) into the vein Once a day 25 mL      ursodiol (ACTIGALL) 60 mg/mL syrup Take 10 mL (600 mg total) by mouth 2 (two) times daily        valGANciclovir (VALCYTE) 450 mg tablet Take 1 tablet (450 mg total) by mouth 2 (two) times daily        vitamin E, dl,tocopheryl acet, (VITAMIN E, DL, ACETATE,) 45 mg (161 unit) capsule Take 8 capsules (800 Units total) by mouth daily          No current facility-administered medications for this visit.         PAST MEDICAL HISTORY:       Past Medical History:   Diagnosis Date    Acid reflux disease      Anxiety      Depression      Hereditary angioedema (CMS code)      Multiple myeloma, without mention of having achieved remission       IgG kappa    Myeloma (CMS code) 05/10/2011         PAST SURGICAL HISTORY:        Past Surgical History:   Procedure Laterality Date    AUTOLOGOUS STEM CELL TRANSPLANTATION   05/12/11    BREAST CYST EXCISION   1981   R breast surgery on 10/02/20 (path negative)     FAMILY HISTORY:         Family History   Problem Relation Name Age of Onset    Diabetes Mother        Hypertension Mother        Colon cancer Father        Hypertension Brother ##Brother1           SOCIAL HISTORY:  Social History           Socioeconomic History    Marital status: Married   Tobacco Use    Smoking status: Unknown   Substance and Sexual Activity    Sexual activity: Not Currently         PHYSICAL EXAM:  PHYSICAL EXAM VIA VIDEO VISIT:  ECOG Performance Status: 1 - Symptomatic but completely ambulatory   On VV, patient is laying in bed and speaking  slowly, difficulty with word finding. Cannot complete sentences.   Vital Signs: No vitals  General:  In NAD, but appears weak and confused  Respiratory:  Normal breathing, No respiratory distress           LABORATORY DATA:          sPEP Interpretation   Date Value Ref Range Status   12/14/2021 Abnormal (A) Normal pattern. Final       Comment:       Hypoalbuminemia and hypogammaglobulinemia with a protein spike in the gamma region, consistent with a monoclonal protein, possibly daratumumab. Paraprotein spike = 0.2 g/dL.  Interpretation by Pathologist: Ariel Braun, M.D.  M-Protein (monoclonal), Serum   Date Value Ref Range Status   12/14/2021 0.2 (A) Negative g/dL Final            M-Protein/sPEP - External   Date Value Ref Range Status   08/04/2020 0.8   Final              Immunofixation Electrophoresis, serum   Date Value Ref Range Status   12/14/2021 IgG Kappa (A) Negative Final       Comment:       Paraprotein present, IgG Kappa. The IgG Kappa band is migrating in a region where daratumumab is known to localize on immunofixation.  If this patient is receiving daratumumab treatment, it is possible that the IgG Kappa band represents daratumumab   rather than an endogenous monoclonal protein.  Interpretation by Pathologist: Ariel Braun, M.D.               Lab Results   Component Value Date     IgA, Serum 20 (L) 10/13/2021     IgA - External 12 08/04/2020     IgG 585 (L) 10/13/2021     IgG, serum 745 01/01/2022     IgG - External 989 08/04/2020     IgM, Serum 7 (L) 10/13/2021     IgM - External 14 08/04/2020     Kappa (Serum, Free) - External 70.6 08/04/2020     Kappa Light Chain, Serum, Free 16.6 12/14/2021     Lambda (Serum, Free) - External 3.9 08/04/2020     Lambda Light Chain, Serum, Free 2.7 (L) 12/14/2021     Kappa/Lambda Ratio, Serum, Free 6.15 (H) 12/14/2021            PATHOLOGY:  As noted above.     RADIOLOGY:  As noted above.     ASSESSMENT &PLAN  The patient is a 61 y.o. female who  presents for evaluation of myeloma.     # IgG kappa MM: Prior lines of therapy include (1) VRd --> MEL ASCT --> observation, (2) Rev --> Rd, and now (3) DARA-Pom/dex started 04/2020 for M-spike rising to 1.1. Now down to 0.8 as of 07/07/2020, although some of this may be DARA interference of course. Had brief increase in [facility] around time of breast surgery 09/2020 (when surgery was on hold), but then resumed thereafter. 02/2021 SPEP M protein and SFLC downtrending and 08/18/2021 MM labs stable   - Patient has history of allergic reactions and HAE (was monitored 1-3 hrs after first doses, got Singulair pre-med initially) but no issues so far with DARA.   - Continue to HOLD DARA-Pd due to recent prolonged hospitalization for multiple infections and liver failure.  Continue to recover and regain strength.  DARA once a month (HOLD, last had this 1st week of Dec 2022)  Pom 2mg  daily, 21 of 28 days (HOLD)  Dex once weekly, 21 of 28 days (HOLD)  - no recent labs  - Continue monthly MM labs  - next line will consider CFZ/seli/dex but will need to discuss if seli AEs would be ok with her      #Poor performance status: patient's performance status had been declining for 2-3 months prior to ED admission. In early 04/2022, patient was discharged home but still debilitated and laying in hospital bed and minimally conversant.  Today, 06/11/22, patient is at [facility] and sitting up in chair and conversant, even recalling accurately past events.  - Plan to remain at SNF and continue PT/OT  -  Resuming myeloma therapy will depend on improvement of functional status.  Ideally she should be able to move around with a walker or cane and no longer requiring tube feeds.    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  01/13/22 CMV 176 international units/ml prior to discharge from [place]. Inpatient, treated with Ganciclovir IV, IVIG x1, and then discharged from [place] on Valcyte 450mg  PO BID.  04/03/22 CMV PCR <200 (wnl) [facility].  05/25/22  CMV PCR at [place] detectable <30.  - Continue letermovir indefinitely (switched from valcyte due to count suppression)  - check CMV PCR monthly     #Norovirus: Patient first noted diarrhea after covid vaccinations. Not helped with prevalite, but imodium helped. After last covid vaccine 06/2021, she has had increase diarrhea. 08/27/21 CDiff and O+P negative, but + for norovirus.  Went to [facility] ED 12/02/21 for persistent diarrhea, FTT, malnutrition and new encelphalopathy.  Stool study last completed at [facility] 04/07/22, showing persistent Norovirus.  04/23/22 repeat GI viral PCR negative for Noro.  - continues on supportive care  - nutrition via PEG  - Norovirus negative as for 04/23/22     #Liver failure:  New AMS/encephalopathy noted during 12/02/21 VV.  Patient advised to go to ED.  Taken by ambulance to [facility] ED 12/02/21 and admitted for an extended period of time. On admission, T bili 5.4, Alk phos 1314, AST/ALT 152/130.  At worse T bili 12.1, alk phos 958, ast/alt 118/150 on 12/09/21. Hepatology consulted at [facility], liver bx showed steatohepatitis with pericentral/sinusoidal and periportal fibrosis; possibly drug related (Pom?). On 04/15/22, prior to d/c from [facility], T bili 0.7, alk phos 230, ast/alt 72/34.  LFTs improved and stable.   - on ursodiol  - continue to monitor     #Ascites: has been requiring paracentesis twice weekly.  Last had para on 05/21/22 at [facility] prior to discharge. Has not needed another since, abdomen distention much improved and stable.  - paracentesis PRN [facility]     # Anxiety, emotional distress: Causes of stress include her role as sole caregiver to her son (intellectually disabled, severe hyperparathyroidism), husband (diabetes), and herself. Also stressed about the myeloma itself and its prognosis, and also about COVID.   - follow up SMS    - Can consider oncopsychology referral down the line     # R breast lesion: Noted on 12/2019 PET-CT, then confirmed on 04/2020 R breast US. She got the  breast biopsy 07/08/2020 showed papillary lesion, recommending excisional biopsy which returned as benign, per patient   - follow up Dr. Ardeth Sportsman (local oncologist) and Dr. Selena Batten (surgeon)     # Hereditary angioedema:   - Follows with allergist locally   - No issues with DARA-Pd     # History of PEs: Occurred while on Revlimid.   - Continue low-dose Eliquis indefinitely, particularly while on Pom     #Bone health  - if she did not finish 2 years of zometa, then would recommend finishing a total of 2 years AFTER she finishes the dental work she needs currently     [facility]  - follow up with PCP re: mammo, colonoscopy, vaccines     #Ppx:  - On letermovir for CMV  - s/p Pfizer #1 02/26/20, #2 03/18/20, #3 08/14/20, #4 02/02/21, #5 06/20/2021  - recommend bivalent covid booster  - recommend yearly flu vaccine  - 11/06/20 covid ab neg  - repeat covid Ab 03/03/21 was neg   -  s/p evusheld 04/01/21  - recommend      #Follow-up:  1 month with NP or Dr. Daphine Deutscher        Thank you for involving me in the care of your patient. Please do not hesitate to call me anytime to discuss this patient.     Royal Piedra NP     Patient Care Team:  Trudie Reed, DO as PCP - General (Family Medicine)  Hilary Hertz, MD (Hematology and Oncology)  Generic Provider Mychart  Marzella Schlein, MD (Palliative Care Medicine)  Larina Earthly III, MD (Hematology)  Dr. Irean Hong (Surgeon)       I, Mills Koller, NP, spent a total of 60 minutes on this patient's care on the day of their visit excluding time spent related to any billed procedures. This time includes time spent with the patient as well as time spent documenting in the medical record, reviewing patient's records and tests, obtaining history, placing orders, communicating with other healthcare professionals, counseling the patient, family, or caregiver, and/or care coordination for the diagnoses above.       This patient has a high complexity problem: multiple myeloma, which is an  incurable malignancy and poses a threat to life and bodily function.   Patient is at high risk of disease progression and must be closely monitored.     Regarding data complexity, I have reviewed the following tests: CBC, CMP, SPEP, light chains     Medical decision making: high risk.  Continue hold myeloma therapy due to recent severe illness and prolonged hospitalization.  Patient with recent multiple infections, liver failure, and remains in SNF for rehab.    The patient was extensively counseled on this and agrees to continue with the treatment plan.         I performed this evaluation using real-time telehealth tools, including a live video Zoom connection between my location and the patient's location. Prior to initiating, the patient consented to perform this evaluation using telehealth tools.

## 2022-08-25 MED ORDER — RIFAXIMIN 550 MG TABLET
550 mg | ORAL_TABLET | Freq: Two times a day (BID) | ORAL | 0 refills | Status: DC
Start: 2022-08-25 — End: 2023-03-01

## 2022-09-01 LAB — ALC (ABSOLUTE LYMPHOCYTE COUNT) - EXTERNAL: Abs Lymphocytes (External): 1.3

## 2022-09-01 LAB — TOTAL PROTEIN, SERUM - EXTERNAL: Total Protein, Serum - External: 5.8

## 2022-09-01 LAB — MONOCYTE ABS COUNT - EXTERNAL: Monocyte Abs Count - External: 0.5

## 2022-09-01 LAB — ALBUMIN, SERUM - EXTERNAL: Albumin, Serum - External: 3.1 g/dL — AB (ref 3.5–4.8)

## 2022-09-01 LAB — WBC - EXTERNAL: WBC - External: 3.6

## 2022-09-01 LAB — ANC (ABSOLUTE NEUTROPHIL COUNT) - EXTERNAL: ANC (Absolute Neutrophil Count) - External: 1.7

## 2022-09-01 LAB — BILIRUBIN, TOTAL - EXTERNAL: Bilirubin, Total - External: 0.8

## 2022-09-01 LAB — CALCIUM - EXTERNAL: Calcium - External: 8.9

## 2022-09-01 LAB — BUN - EXTERNAL: BUN - External: 20

## 2022-09-01 LAB — LAMBDA (SERUM, FREE) - EXTERNAL: Lambda (Serum, Free) - External: 15.5

## 2022-09-01 LAB — GLUCOSE, RANDOM - EXTERNAL: Glucose, Ser/Pl - External: 71

## 2022-09-01 LAB — KAPPA (SERUM, FREE) - EXTERNAL: Kappa (Serum, Free) - External: 57.8

## 2022-09-01 LAB — EOSINOPHIL ABS COUNT - EXTERNAL: Eosinophil Abs Count - External: 0.1

## 2022-09-01 LAB — MCV - EXTERNAL: MCV - External: 91

## 2022-09-01 LAB — SODIUM - EXTERNAL: Sodium - External: 140

## 2022-09-01 LAB — AST - EXTERNAL: AST - External: 18

## 2022-09-01 LAB — HEMOGLOBIN - EXTERNAL: Hemoglobin - External: 9.8

## 2022-09-01 LAB — POTASSIUM - EXTERNAL: Potassium - External: 3.8

## 2022-09-01 LAB — HEMATOCRIT - EXTERNAL: Hematocrit - External: 29.7

## 2022-09-01 LAB — PLATELETS - EXTERNAL: Platelets - External: 162

## 2022-09-01 LAB — ALKALINE PHOSPHATASE - EXTERNAL: Alkaline Phosphatase - External: 121

## 2022-09-01 LAB — CREATININE, SERUM - EXTERNAL: Creatinine, Serum - External: 0.78

## 2022-09-01 LAB — RBC - EXTERNAL: RBC - External: 3.27

## 2022-09-01 LAB — EXTERNAL RESULT REPORT SCANNED (YES/NO)?

## 2022-09-01 LAB — ALT - EXTERNAL: ALT - External: 9

## 2022-09-09 ENCOUNTER — Telehealth: Admit: 2022-09-23 | Discharge: 2022-09-23 | Payer: PRIVATE HEALTH INSURANCE | Attending: Physician

## 2022-09-09 DIAGNOSIS — C9000 Multiple myeloma not having achieved remission: Secondary | ICD-10-CM

## 2022-09-09 MED ORDER — THERAPEUTIC MULTIVITAMIN TABLET
Freq: Every day | ORAL | 3.00 refills | 84.00000 days | Status: DC
Start: 2022-09-09 — End: 2023-07-01

## 2022-09-09 NOTE — Patient Instructions (Signed)
Will discuss with infectious disease regarding rifaxamin and letemovir  Can stop Ursodiol

## 2022-09-09 NOTE — Progress Notes (Unsigned)
Ariel Braun  [id number]    Date of Service: 09/09/2022    Reason for visit / Chief Complaints:    Subjective   Ariel Braun is a 61 y.o. female with MM who is on VIDEO follow-up    I performed this consultation using real-time Telehealth tools, including a live video connection between my location and the patient's location. Prior to initiating the consultation, I obtained informed verbal consent to perform this consultation using Telehealth tools and answered all the questions about the Telehealth interaction. Patient in [place].    Events since the last visit: The patient has been OFF therapy. She had a prolonged hospitalization in early 2023 for ~ 5 months (CMV colitis, norovirus infection (chronic), FTT. Then readmitted ~6/8-7/13/23 - now getting PT/OT. Has been rifaximin and letemovir. Symptoms are relatively stable.     No new bone pain or signs of progressive MM. No kidney/urinary difficuties. No f/chills, URI symptoms, sinus congestion, sore throat or other infectious symptoms. No N/V/dysuria/diarrhea.  No CP, palp.  No bleeding or bruising.    Oncology history:  IgG kappa myeloma, diagnosed 09/07/10     Significant bone involvement, 8% plasma cells, normal cyto 46XX, FISH showed only 3 copies 1q21, Stage 2 ISS (3.88 beta 2 microglobulin),IgG 4750, M-spike 3.2, kappa 586.9 mg/L.     RVD 10/2010 through April 2011 (? 4 cycles). Achieved VGPR with M-spike 0.4, IgG 886, kappa 27.6.     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     8 months post transplant, her M-spike continues to fall (0.2 on 11/26/11), she's doing great.     She has intermittent pain right hip, with x-ray showing old lesion. Would recommend PET/CT.     01/21/12: M-spike 0.3, kappa 16.5, IgG 810. PET/CT neg.     Swelling of feet and nausea (maybe HAE or zometa).     Then had HAE attacks x 2 with abdominal pain and vomiting.     Observation and no bisphos 2/2 her concerns.     08/11/12:  Stable M-protein 0.2, IgG 729, kappa 12.3 in August and IgG 723.     09/29/12: MRI shoulder no myeloma. Kappa 17.     12/08/12: HAE attacks more frequent (weekly) but less severe. To receive C 1 esterase inhibitor (Berinert) prior to receiving pneumovax, TdAP, and hep B (12/11/12).     12/18/12. Slow rise in para-protein to M-spike 0.4     01/26/13: Did well with immunizations with Berinert pre-med. Hasn't used Audiological scientist since.     03/14/13: PET/CT no uptake. Meanwhile, M-spike of 0.4, IgG 937, kappa 18.5.     06/12/13: No HAE. M-spike 0.6, IgG 1270.     08/24/13: M-spike 0.6, IG 1330, kappa 32.3. Continue to observe.     03/29/14: MRI of hip no myeloma. M-spike 1.1.     06/11/14: PET normal.     08/05/14: Started Rev 15 mg 3/4. M-spike 1, IgG 1640, kappa 62.2     09/23/14: Seeing [name]. Less pain in feet since Rev.M-spike 0.8, IgG 1532, kappa 62.     02/17/15: Kappa up a bit to 79.6. Proposed adding Ixazomib to Rev dex if she gets to kappa 100.     06/10/15: Kappa 76.9 Stable on Rev. Although ANC however at about 0.8 to 1.     09/16/15: Kappa 69.9 (down) but ANC 0.8. Will reduce Revlimid to 15 mg qod to  allow ANC to rise a bit.     12/09/15: Feels much better on Rev 15 mg qod, but M-spike up to 1.1 and IgG 1660. Will add dex 8 mg weekly and ativan for anxiety and sleep.     04/06/16: Now on dex 4 mg two days in a row, Rev 15 mg q o d.   06/04/16: Kappa 30.1. Cont. QOD Rev 15mg . PET scan ordered and scheduled.     08/20/16: Feels fine. Myeloma labs stable with M-spike 1.1, IgG 1500, kappa 59. PET/CT neg from 7/28. Plan to continue Rev 15 mg qod, Dex 8 mg qwk.     12/14/16: Minimal left flank pain which is rare and intermittent. No hematuria. Myeloma labs better if anything with M-spike 0.9 and Kappa 44.2. Continue Rev 15 mg qod and dex 8 mg q week.     03/15/17: Pain gone. IgG 1530. Cont Rev 15 mg qod and dex 8 mg weekly.     06/21/17: Stable M-1.2, IgG 1480, KLC 58.9. Cont Rev 15 mg qod and dex 8 mg q wk.     09/16/17: M-1.2. KLC  52.6. Continue Rev 15 mg qod. Dex 8 mg q wk     09/16/17: Stable. M-0.2, kappa 59. Continue Rev 15 mg qod and dex 8 mg q wk. Note: no HAE episodes in 2 years.     03/19/18: Multiple PEs. On Eliquis. Hold Rev.     04/11/18: M-1.4, VWU98. Resume Rev at 15 mg daily (3/4), then qod, Cont dex 8 mg qwk. Cont Eliquis. Pneumonia as well as PE's in early May. Repeat CT chest due to a mass (3.4 cm x 1.6 cm) at left posterior sulcus.     05/10/18: Rev 15 mg qod.     05/17/18: Repeat CT ([facility]) with improvement and no mass or continued PEs. Mild intermittent pain L posterior chest (not pleuritic). M-1.3, IgG 1510, KLC 64.     08/08/18: Chest pain better. CT scan > resolution of PEs.     12/12/18: KLC 78. Rev 15 mg qod, dex 8 mg (4/4).     04/17/19: M-1.4, KLC 96.3. ? PD. Up Rev to 15 mg qd (3/4) and dex 8 mg wkly(4/4).       07/20/19: L hip pain. M-1.2, KLC 96. Cont. Rev 15 mg (3/4)/dex 8 mg (4/4).     08/21/19:   KLC 101, M-1.2.       12/11/19:  KLC 88. Stable. PCD vs local XRT.     01/01/20:  PET > sclerotic lesion R ilium/mid right SI jt with SUV 4.4.       02/12/20:  L hip pain better. KLC 73.     02/19/20: KLC 109 (PD).     04/08/20:  KLC 122.  Given rise in KLC and progression in R ilium will switch to Pom 3,   CTX 300, Dex 8 (3/4).    Switched care to Dr. Dolores Lory and went with DaraPomDex.       Past Medical History:   Diagnosis Date    Acid reflux disease     Anxiety     Depression     Hereditary angioedema (CMS code)     Multiple myeloma, without mention of having achieved remission     IgG kappa    Myeloma (CMS code) 05/10/2011     Past medical, family and social histories, as well as medications and allergies, were reviewed and updated in the medical record as appropriate.    Current Outpatient  Medications   Medication Sig Dispense Refill    cholecalciferol, vitamin D3, 1000 UNITS tablet 2 tablets (2,000 Units total) by Feeding Tube route daily      cyanocobalamin, Vitamin B12, 1,000 mcg/mL injection Inject 1 mL (1,000 mcg total) under  the skin every 7 (seven) days Every Saturday      folic acid (FOLVITE) 1 mg tablet 1 tablet (1 mg total) by Feeding Tube route daily      furosemide (LASIX) 20 mg tablet 3 tablets (60 mg total) by Feeding Tube route daily Hold for SBP less than 90.      HYDROcodone-acetaminophen (NORCO) 5-325 mg tablet 1-2 tablets by Feeding Tube route every 6 (six) hours as needed for Pain 30 tablet 0    hydrOXYzine (ATARAX) 25 mg tablet 1 tablet (25 mg total) by Feeding Tube route every 4 (four) hours as needed for Anxiety      lansoprazole (PREVACID) 3 mg/mL suspension 10 mL (30 mg total) by Feeding Tube route every morning before breakfast      letermovir (PREVYMIS) tablet 1 tablet (480 mg) by feeding tube daily 30 tablet 3    loperamide (IMODIUM A-D) 2 mg tablet Take 2 tablets (4 mg) by feeding tube three times daily.      mirtazapine (REMERON) 30 mg tablet 1 tablet (30 mg total) by Per G Tube route nightly at bedtime      multivitamin with iron-minerals-folic acid (THERA M PLUS) 9 mg iron-400 mcg tablet 1 tablet by Feeding Tube route daily      ondansetron (ZOFRAN) 8 mg tablet 1 tablet (8 mg total) by Feeding Tube route in the morning and 1 tablet (8 mg total) at noon and 1 tablet (8 mg total) in the evening.      prochlorperazine (COMPAZINE) 5 mg tablet 1 tablet (5 mg total) by Feeding Tube route every 8 (eight) hours as needed for Nausea      rifAXIMin (XIFAXAN) 550 mg tablet Take 1 tablet (550 mg total) by mouth 2 (two) times daily 180 tablet 0    simethicone (MYLICON) 40 mg/0.6 mL drops 1.2 mL (80 mg total) by Feeding Tube route in the morning and 1.2 mL (80 mg total) at noon and 1.2 mL (80 mg total) in the evening and 1.2 mL (80 mg total) before bedtime.      simethicone (MYLICON) 40 mg/0.6 mL drops Take 2.4 mL (160 mg total) by mouth in the morning and 2.4 mL (160 mg total) at noon and 2.4 mL (160 mg total) in the evening and 2.4 mL (160 mg total) before bedtime.      spironolactone (ALDACTONE) 50 mg tablet 3 tablets  (150 mg total) by Feeding Tube route daily Hold for SBP less than 90.      thiamine mononitrate, vit B1, 100 mg tablet 1 tablet (100 mg total) by Feeding Tube route daily      ursodiol (ACTIGALL) 60 mg/mL syrup 10 mL (600 mg total) by Feeding Tube route 2 (two) times daily       No current facility-administered medications for this visit.       ALLERGIES  Allergies/Contraindications   Allergen Reactions    Diphenoxylate-Atropine Abdominal Pain     Severe stomach pain     Caffeine Anxiety     "Jitters"       PHYSICAL EXAM- LIMIT EXAM BY VIDEO  Objective: ECOG1  Constitutional:  Pleasant over video  HENT:  Normocephalic, Neck- Supple.   SKIN: No rash, noted  Eyes:  PERRL, EOMI, sclera appear clear  Respiratory:  Chest excursions good Musculoskeletal: Moving all extremities.   Neurologic:  Alert & oriented x 3, nl affect, No focal deficits noted.       RESULTS: Labs in [facility] were reviewed. Lab results reviewed and interpretation provided to patient  Lab Results   Component Value Date    WBC Count 5.0 05/27/2022    Hemoglobin 8.5 (L) 05/27/2022    Hematocrit 28.3 (L) 05/27/2022    MCV 101 (H) 05/27/2022    Platelet Count 130 (L) 05/27/2022     Lab Results   Component Value Date    Creatinine 0.57 05/27/2022    Creatinine Whole Blood 0.8 06/05/2014         Outside Labs:    No results found.  ASSESSMENT & PLAN  1. Multiple Myeloma: Impression: established - stable - OFF therapy - requiring disease monitoring  This patient is off therapy and at high-risk for progression and requiring intensive monitoring. The patient's last therapy has been ***     We reviewed the labs: SIFE, SPEP, SFLC, CBC =>    We discussed NO THERAPY AT PRESENT.     The patient has a high complexity problem: multiple myeloma, which is an incurable malignancy and the patient is receiving active monitorring, and can have relapse which poses a threat to life and bodily function.    Regarding data complexity, I have reviewed the following tests => SPEP,  SIFE, SFLCs, CBC and CMP.      Medical Decision Making/ high-risk  Plan:   - Continue OFF therapy   - Continue to follow myeloma protein levels every 4-6 weeks  - Follow-up in 6 weeks with Harriett Sine then 3 months     2. Immunocompromised state/Infectious Disease concerns: Impression: established - immunocompromised from disease/treatment:  The patient is immunocompromised and is at high-risk from infection due to therapy. Has been avoiding Covid exposure. Following guidelines.    Requiring close follow-up for symptoms.  At risk for VZV reactivation and is on ACV. No current signs of infection. Will call with new signs of infection        Plan: - Continue prophylaxis  - yearly flu vaccine  - Needs to shelter from Covid exposure  - The patient will call with any symptoms or signs of infection    3. Depression/Psychologic/Social Stressors: Impression: established - stable  The patient has cancer and has situation depression and is at risk for anxiety. Needs close follow-up    Currently, the attitude is good. There are no signs of anxiety or active depression. Mood appropriate, understands plan      Plan: No additional medications needed    4. Anemia: Impression: established - stable    The blood counts are stable, no changes. Next CBC in 4-6 weeks.       Plan: No transfusions or cytokines needed today.    5. Neutropenia/Thrombocytopenia: Impression: established - stable    The disease and prior treatment has toxicity on the bone marrow - no current signs of suppression    Plan: No cytokines or transfusions    6. RI/ Electrolyte Replacement: Impression: established - stable     Taking ample fluids and avoiding nephrotoxins. Lytes & Cr Okay, appears euvolemic. Appetite good and weight unchanged. No new medications needed.     Plan: Continue adequate fluid intake & balanced, nutritional diet   - Continue as much exercise as possible.  - No additional supplementation needed.  7. Osteopenia: Impression: established -  stable:    The patient is at risk for fracture due to MM. We recommend Calcium and Vit D replacement. We also recommend an aggressive exercise program.    Plan: - No Biphosphonates  - Will need a bone density every 2 years.    8. Secondary Cancer Risk: impression: established - NEEDS close follow-up  The patient has high-risk of secondary malignancies. Needs close follow-up and routine cancer screening.    Plan: - Derm follow-up yearly  - Colonoscopy every 5 years  - Mammos per PMD when out of rehab.

## 2022-11-04 LAB — COMPLETE BLOOD COUNT WITH DIFFERENTIAL
% Basophils: 1 %
% Eosinophils: 2 %
% Immature Granulocytes: 1 %
% Lymphocytes: 35 %
% Monocytes: 11 %
% Neutrophils: 50 %
Abs Basophils: 0 10*3/uL (ref 0.0–0.2)
Abs Eosinophils: 0.1 10*3/uL (ref 0.0–0.4)
Abs Lymphocytes: 1.4 10*3/uL (ref 0.7–3.1)
Abs Monocytes: 0.4 10*3/uL (ref 0.1–0.9)
Abs Neutrophils: 2.1 10*3/uL (ref 1.4–7.0)
Hematocrit: 31.5 % — ABNORMAL LOW (ref 34.0–46.6)
Hemoglobin: 10 g/dL — ABNORMAL LOW (ref 11.1–15.9)
MCH: 30.2 pg (ref 26.6–33.0)
MCHC: 31.7 g/dL (ref 31.5–35.7)
MCV: 95 fL (ref 79–97)
Platelet Count: 196 10*3/uL (ref 150–450)
RBC Count: 3.31 x10E6/uL — ABNORMAL LOW (ref 3.77–5.28)
RDW-CV: 15.1 % (ref 11.7–15.4)
WBC Count: 4 10*3/uL (ref 3.4–10.8)

## 2022-11-04 LAB — LACTATE DEHYDROGENASE, BLOOD: LDH, serum/plasma: 247 IU/L — ABNORMAL HIGH (ref 119–226)

## 2022-11-04 LAB — COMPREHENSIVE METABOLIC PANEL (BMP, AST, ALT, T.BILI, ALKP, TP ALB)
AST: 24 IU/L (ref 0–40)
Alanine transaminase: 12 IU/L (ref 0–32)
Albumin, Serum / Plasma: 3.5 g/dL — ABNORMAL LOW (ref 3.9–4.9)
Albumin/Globulin Ratio: 1.2 (ref 1.2–2.2)
Alkaline Phosphatase: 121 IU/L (ref 44–121)
BUN/Creatinine Ratio (External Lab): 17 (ref 12–28)
Bilirubin, Total: 0.4 mg/dL (ref 0.0–1.2)
Calcium, total, Serum / Plasma: 9.5 mg/dL (ref 8.7–10.3)
Carbon Dioxide, Total: 25 mmol/L (ref 20–29)
Chloride, Serum / Plasma: 108 mmol/L — ABNORMAL HIGH (ref 96–106)
Creatinine: 0.6 mg/dL (ref 0.57–1.00)
Globulin, Total: 2.9 g/dL (ref 1.5–4.5)
Glucose, non-fasting: 81 mg/dL (ref 70–99)
Potassium, Serum / Plasma: 4 mmol/L (ref 3.5–5.2)
Protein, Total, Serum / Plasma: 6.4 g/dL (ref 6.0–8.5)
Sodium, Serum / Plasma: 141 mmol/L (ref 134–144)
Urea Nitrogen, Serum / Plasma: 10 mg/dL (ref 8–27)
eGFRcr: 101 mL/min/{1.73_m2} (ref >59)

## 2022-11-09 LAB — PROTEIN ELECTROPHORESIS, SERUM
A/G Ratio: 1 (ref 0.7–1.7)
Albumin, sPEP: 3.2 g/dL (ref 2.9–4.4)
Alpha 1 Globulin: 0.3 g/dL (ref 0.0–0.4)
Alpha 2 Globulin: 0.7 g/dL (ref 0.4–1.0)
Beta Globulin: 0.9 g/dL (ref 0.7–1.3)
Gamma Globulin: 1.4 g/dL (ref 0.4–1.8)
Globulin, Total: 3.3 g/dL (ref 2.2–3.9)
M-Protein (monoclonal), Serum: 0.8 g/dL — ABNORMAL HIGH
Protein, Total, Serum / Plasma: 6.5 g/dL (ref 6.0–8.5)

## 2022-11-09 LAB — KAPPA AND LAMBDA FREE LIGHT CHAINS, SERUM
Kappa Light Chain, Serum, Free: 51.8 mg/L — ABNORMAL HIGH (ref 3.3–19.4)
Kappa/Lambda Ratio, Serum, Free: 4.32 — ABNORMAL HIGH (ref 0.26–1.65)
Lambda Light Chain, Serum, Free: 12 mg/L (ref 5.7–26.3)

## 2022-11-09 LAB — IMMUNOFIXATION ELECTROPHORESIS, SERUM
IgA, Serum: 35 mg/dL — ABNORMAL LOW (ref 87–352)
IgG: 1525 mg/dL (ref 586–1602)
IgM, Serum: 28 mg/dL (ref 26–217)

## 2022-11-24 MED ORDER — LETERMOVIR 480 MG TABLET
480 mg | ORAL_TABLET | Freq: Every day | ORAL | 3 refills | Status: DC
Start: 2022-11-24 — End: 2023-02-24

## 2022-12-04 LAB — COMPLETE BLOOD COUNT WITH DIFFERENTIAL
% Basophils: 1 %
% Eosinophils: 2 %
% Immature Granulocytes: 1 %
% Lymphocytes: 29 %
% Monocytes: 9 %
% Neutrophils: 58 %
Abs Basophils: 0 10*3/uL (ref 0.0–0.2)
Abs Eosinophils: 0.1 10*3/uL (ref 0.0–0.4)
Abs Lymphocytes: 1.9 10*3/uL (ref 0.7–3.1)
Abs Monocytes: 0.6 10*3/uL (ref 0.1–0.9)
Abs Neutrophils: 3.8 10*3/uL (ref 1.4–7.0)
Hematocrit: 37.8 % (ref 34.0–46.6)
Hemoglobin: 12 g/dL (ref 11.1–15.9)
MCH: 29.5 pg (ref 26.6–33.0)
MCHC: 31.7 g/dL (ref 31.5–35.7)
MCV: 93 fL (ref 79–97)
Platelet Count: 200 10*3/uL (ref 150–450)
RBC Count: 4.07 x10E6/uL (ref 3.77–5.28)
RDW-CV: 14.7 % (ref 11.7–15.4)
WBC Count: 6.4 10*3/uL (ref 3.4–10.8)

## 2022-12-04 LAB — COMPREHENSIVE METABOLIC PANEL (BMP, AST, ALT, T.BILI, ALKP, TP ALB)
AST: 23 IU/L (ref 0–40)
Alanine transaminase: 13 IU/L (ref 0–32)
Albumin, Serum / Plasma: 4.2 g/dL (ref 3.9–4.9)
Albumin/Globulin Ratio: 1.6 (ref 1.2–2.2)
Alkaline Phosphatase: 133 IU/L — ABNORMAL HIGH (ref 44–121)
BUN/Creatinine Ratio (External Lab): 22 (ref 12–28)
Bilirubin, Total: 0.7 mg/dL (ref 0.0–1.2)
Calcium, total, Serum / Plasma: 10.2 mg/dL (ref 8.7–10.3)
Carbon Dioxide, Total: 24 mmol/L (ref 20–29)
Chloride, Serum / Plasma: 105 mmol/L (ref 96–106)
Creatinine: 0.78 mg/dL (ref 0.57–1.00)
Globulin, Total: 2.7 g/dL (ref 1.5–4.5)
Glucose, non-fasting: 90 mg/dL (ref 70–99)
Potassium, Serum / Plasma: 3.8 mmol/L (ref 3.5–5.2)
Protein, Total, Serum / Plasma: 6.9 g/dL (ref 6.0–8.5)
Sodium, Serum / Plasma: 140 mmol/L (ref 134–144)
Urea Nitrogen, Serum / Plasma: 17 mg/dL (ref 8–27)
eGFRcr: 86 mL/min/{1.73_m2} (ref >59)

## 2022-12-04 LAB — LACTATE DEHYDROGENASE, BLOOD: LDH, serum/plasma: 192 IU/L (ref 119–226)

## 2022-12-07 ENCOUNTER — Telehealth: Admit: 2022-12-08 | Payer: PRIVATE HEALTH INSURANCE | Attending: Nurse Practitioner

## 2022-12-07 DIAGNOSIS — C9002 Multiple myeloma in relapse: Secondary | ICD-10-CM

## 2022-12-07 NOTE — Patient Instructions (Signed)
Recommend Prevnar-20, covid booster, and RSV vaccines

## 2022-12-07 NOTE — Progress Notes (Signed)
Ariel Braun  [id number]    Date of Service: 12/07/2022    Reason for visit / Chief Complaints:    Subjective   Ariel Braun is a 62 y.o. female with MM who is on VIDEO follow-up    I performed this consultation using real-time Telehealth tools, including a live video connection between my location and the patient's location. Prior to initiating the consultation, I obtained informed verbal consent to perform this consultation using Telehealth tools and answered all the questions about the Telehealth interaction. Patient in [place].    Events since the last visit: The patient has been OFF therapy. She had a prolonged hospitalization in early 2023 for ~ 5 months (CMV colitis, norovirus infection (chronic), FTT. Then readmitted ~6/8-7/13/23 - and stay at a SNF for an extended prior of time after discharge. Has been home since Nov 2023 and getting stronger. No longer needing home services. Symptoms are relatively stable.  Using walker and cane outdoors. Eating well, bowels moving fine.  No recent infection. Occasional pain in hands and feet, worse in early morning, improved with movement. No numbness or tingling. Denies bone pain otherwise.    Has questions about meds. 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      Oncology history:  IgG kappa myeloma, diagnosed 09/07/10     Significant bone involvement, 8% plasma cells, normal cyto 46XX, FISH showed only 3 copies 1q21, Stage 2 ISS (3.88 beta 2 microglobulin),IgG 4750, M-spike 3.2, kappa 586.9 mg/L.     RVD 10/2010 through April 2011 (? 4 cycles). Achieved VGPR with M-spike 0.4, IgG 886, kappa 27.6.     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     8 months post transplant, her M-spike continues to fall (0.2 on 11/26/11), she's doing great.     She has intermittent pain right hip, with x-ray showing old lesion. Would recommend PET/CT.     01/21/12: M-spike 0.3, kappa 16.5, IgG 810. PET/CT neg.      Swelling of feet and nausea (maybe HAE or zometa).     Then had HAE attacks x 2 with abdominal pain and vomiting.     Observation and no bisphos 2/2 her concerns.     08/11/12: Stable M-protein 0.2, IgG 729, kappa 12.3 in August and IgG 723.     09/29/12: MRI shoulder no myeloma. Kappa 17.     12/08/12: HAE attacks more frequent (weekly) but less severe. To receive C 1 esterase inhibitor (Berinert) prior to receiving pneumovax, TdAP, and hep B (12/11/12).     12/18/12. Slow rise in para-protein to M-spike 0.4     01/26/13: Did well with immunizations with Berinert pre-med. Hasn't used [employer] since.     03/14/13: PET/CT no uptake. Meanwhile, M-spike of 0.4, IgG 937, kappa 18.5.     06/12/13: No HAE. M-spike 0.6, IgG 1270.     08/24/13: M-spike 0.6, IG 1330, kappa 32.3. Continue to observe.     03/29/14: MRI of hip no myeloma. M-spike 1.1.     06/11/14: PET normal.     08/05/14: Started Rev 15 mg 3/4. M-spike 1, IgG 1640, kappa 62.2     09/23/14: Seeing [name]. Less pain in feet since Rev.M-spike 0.8, IgG 1532, kappa 62.     02/17/15: Kappa up a bit to 79.6. Proposed adding Ixazomib to Rev dex if she gets to kappa 100.  06/10/15: Kappa 76.9 Stable on Rev. Although ANC however at about 0.8 to 1.     09/16/15: Kappa 69.9 (down) but ANC 0.8. Will reduce Revlimid to 15 mg qod to allow ANC to rise a bit.     12/09/15: Feels much better on Rev 15 mg qod, but M-spike up to 1.1 and IgG 1660. Will add dex 8 mg weekly and ativan for anxiety and sleep.     04/06/16: Now on dex 4 mg two days in a row, Rev 15 mg q o d.   06/04/16: Kappa 30.1. Cont. QOD Rev '15mg'$ . PET scan ordered and scheduled.     08/20/16: Feels fine. Myeloma labs stable with M-spike 1.1, IgG 1500, kappa 59. PET/CT neg from 7/28. Plan to continue Rev 15 mg qod, Dex 8 mg qwk.     12/14/16: Minimal left flank pain which is rare and intermittent. No hematuria. Myeloma labs better if anything with M-spike 0.9 and Kappa 44.2. Continue Rev 15 mg qod and dex 8 mg q week.      03/15/17: Pain gone. IgG 1530. Cont Rev 15 mg qod and dex 8 mg weekly.     06/21/17: Stable M-1.2, IgG 1480, KLC 58.9. Cont Rev 15 mg qod and dex 8 mg q wk.     09/16/17: M-1.2. KLC 52.6. Continue Rev 15 mg qod. Dex 8 mg q wk     09/16/17: Stable. M-0.2, kappa 59. Continue Rev 15 mg qod and dex 8 mg q wk. Note: no HAE episodes in 2 years.     03/19/18: Multiple PEs. On Eliquis. Hold Rev.     04/11/18: M-1.4, QZE09. Resume Rev at 15 mg daily (3/4), then qod, Cont dex 8 mg qwk. Cont Eliquis. Pneumonia as well as PE's in early May. Repeat CT chest due to a mass (3.4 cm x 1.6 cm) at left posterior sulcus.     05/10/18: Rev 15 mg qod.     05/17/18: Repeat CT ([place]) with improvement and no mass or continued PEs. Mild intermittent pain L posterior chest (not pleuritic). M-1.3, IgG 1510, KLC 64.     08/08/18: Chest pain better. CT scan > resolution of PEs.     12/12/18: KLC 78. Rev 15 mg qod, dex 8 mg (4/4).     04/17/19: M-1.4, KLC 96.3. ? PD. Up Rev to 15 mg qd (3/4) and dex 8 mg wkly(4/4).       07/20/19: L hip pain. M-1.2, KLC 96. Cont. Rev 15 mg (3/4)/dex 8 mg (4/4).     08/21/19:   [place] 101, M-1.2.       12/11/19:  [place] 88. Stable. PCD vs local XRT.     01/01/20:  PET > sclerotic lesion R ilium/mid right SI jt with SUV 4.4.       02/12/20:  L hip pain better. KLC 73.     02/19/20: KLC 109 (PD).     04/08/20:  [place] 122.  Given rise in [place] and progression in R ilium will switch to Pom 3,   CTX 300, Dex 8 (3/4).    Switched care to Dr. Kennon Rounds and went with DaraPomDex.       Past Medical History:   Diagnosis Date    Acid reflux disease     Anxiety     Depression     Hereditary angioedema (CMS code)     Multiple myeloma, without mention of having achieved remission     IgG kappa    Myeloma (CMS  code) 05/10/2011     Past medical, family and social histories, as well as medications and allergies, were reviewed and updated in the medical record as appropriate.    Current Outpatient Medications   Medication Sig Dispense Refill    cholecalciferol,  vitamin D3, 1000 UNITS tablet 2 tablets (2,000 Units total) by Feeding Tube route daily      cyanocobalamin, Vitamin B12, 1,000 mcg/mL injection Inject 1 mL (1,000 mcg total) under the skin every 7 (seven) days Every Saturday      folic acid (FOLVITE) 1 mg tablet 1 tablet (1 mg total) by Feeding Tube route daily      furosemide (LASIX) 20 mg tablet 3 tablets (60 mg total) by Feeding Tube route daily Hold for SBP less than 90.      HYDROcodone-acetaminophen (NORCO) 5-325 mg tablet 1-2 tablets by Feeding Tube route every 6 (six) hours as needed for Pain 30 tablet 0    hydrOXYzine (ATARAX) 25 mg tablet 1 tablet (25 mg total) by Feeding Tube route every 4 (four) hours as needed for Anxiety      lansoprazole (PREVACID) 3 mg/mL suspension 10 mL (30 mg total) by Feeding Tube route every morning before breakfast      letermovir (PREVYMIS) tablet 1 tablet (480 mg total) by Feeding Tube route daily 1 tablet (480 mg) by feeding tube daily 90 tablet 3    mirtazapine (REMERON) 30 mg tablet 1 tablet (30 mg total) by Per G Tube route nightly at bedtime      multivitamin with iron-minerals-folic acid (THERA M PLUS) 9 mg iron-400 mcg tablet 1 tablet by Feeding Tube route daily      rifAXIMin (XIFAXAN) 550 mg tablet Take 1 tablet (550 mg total) by mouth 2 (two) times daily 180 tablet 0    spironolactone (ALDACTONE) 50 mg tablet 3 tablets (150 mg total) by Feeding Tube route daily Hold for SBP less than 90.      therapeutic multivitamin (THERAGRAN) tablet Take 1 tablet by mouth daily      ursodiol (ACTIGALL) 60 mg/mL syrup 10 mL (600 mg total) by Feeding Tube route 2 (two) times daily      loperamide (IMODIUM A-D) 2 mg tablet Take 2 tablets (4 mg) by feeding tube three times daily. (Patient not taking: Reported on 09/09/2022)      ondansetron (ZOFRAN) 8 mg tablet 1 tablet (8 mg total) by Feeding Tube route in the morning and 1 tablet (8 mg total) at noon and 1 tablet (8 mg total) in the evening. (Patient not taking: Reported on  09/09/2022)      prochlorperazine (COMPAZINE) 5 mg tablet 1 tablet (5 mg total) by Feeding Tube route every 8 (eight) hours as needed for Nausea (Patient not taking: Reported on 09/09/2022)      simethicone (MYLICON) 40 ZO/1.0 mL drops 1.2 mL (80 mg total) by Feeding Tube route in the morning and 1.2 mL (80 mg total) at noon and 1.2 mL (80 mg total) in the evening and 1.2 mL (80 mg total) before bedtime. (Patient not taking: Reported on 09/09/2022)      simethicone (MYLICON) 40 RU/0.4 mL drops Take 2.4 mL (160 mg total) by mouth in the morning and 2.4 mL (160 mg total) at noon and 2.4 mL (160 mg total) in the evening and 2.4 mL (160 mg total) before bedtime. (Patient not taking: Reported on 09/09/2022)      thiamine mononitrate, vit B1, 100 mg tablet 1 tablet (100 mg total)  by Feeding Tube route daily (Patient not taking: Reported on 09/09/2022)       No current facility-administered medications for this visit.       ALLERGIES  Allergies/Contraindications   Allergen Reactions    Diphenoxylate-Atropine Abdominal Pain     Severe stomach pain     Caffeine Anxiety     "Jitters"       PHYSICAL EXAM- LIMIT EXAM BY VIDEO  Objective: ECOG1  Constitutional:  Pleasant over video  HENT:  Normocephalic, Neck- Supple.   SKIN: No rash, noted Eyes:  PERRL, EOMI, sclera appear clear  Respiratory:  Chest excursions good Musculoskeletal: Moving all extremities.   Neurologic:  Alert & oriented x 3, nl affect, No focal deficits noted.       RESULTS: Labs in [facility] were reviewed. Lab results reviewed and interpretation provided to patient  Lab Results   Component Value Date    WBC Count 6.4 12/03/2022    Hemoglobin 12.0 12/03/2022    Hematocrit 37.8 12/03/2022    MCV 93 12/03/2022    Platelet Count 200 12/03/2022     Lab Results   Component Value Date    Creatinine 0.78 12/03/2022    Creatinine Whole Blood 0.8 06/05/2014         Outside Labs:    No results found.  ASSESSMENT & PLAN  1. Multiple Myeloma: Impression: established - stable -  OFF therapy - requiring disease monitoring  This patient is off therapy and at high-risk for progression and requiring intensive monitoring. The patient's last therapy has been was Dara/Pom/Dex (04/2020 - 10/2021), stopped due to persistent norovirus, FTT, CMV colitis, CMV viremia, acute liver injury, encephalopathy, malnutrition requiring PET tube. But toxicity has prevented further therapy. No clinical signs of progression.     Has been off therapy for over a year now and continues to recover from a recent prolonged hospital stay that started in 11/2021 through 04/2022, then discharged to SNF and finally home since 09/2022.    We reviewed the labs from 12/03/22: IFE still pending, M protein 1.1 (0.8 a month ago, 0.2 a year ago).  She has biochemical progression.  This was discussed with Dr. Hassell Done and recommends starting IPd.    Plan:   - Consider starting IPd   Ixazomib '2mg'$  once weekly for 3 of 4 weeks   Pom '2mg'$  daily for 3 of 4 weeks   Dex '20mg'$  once weekly for 3 of 4 week  - will need aspirin ppx with Pom  - plan for restaging PET now  - Continue to follow myeloma protein levels every 4 weeks  - Follow-up in 1 month with Izora Gala and 3 months with Dr. Hassell Done    2. Immunocompromised state/Infectious Disease concerns: Impression: established - immunocompromised from disease/treatment:  The patient is immunocompromised due to progressive myeloma.  Has been avoiding Covid exposure. Following guidelines. Will refer back to ID for follow-up    Requiring close follow-up for symptoms.  At risk for VZV reactivation and is currently on letermovir (history of CMV viremia). No current signs of infection. Will call with new signs of infection        Plan: - Continue prophylaxis  - consider ID e-consult to see if she can stop letermovir  - will need VZV ppx upon resuming myeloma therapy  - yearly flu vaccine  - Needs to shelter from Covid exposure  - The patient will call with any symptoms or signs of infection    3.  Depression/Psychologic/Social Stressors:  Impression: established - stable  The patient has cancer and has situation depression and is at risk for anxiety. Needs close follow-up    Currently, the attitude is good. There are no signs of anxiety or active depression. Mood appropriate, understands plan      Plan: No additional medications needed    4. Anemia: Impression: established - stable    The blood counts are stable, no changes. Next CBC in 4-6 weeks.       Plan: No transfusions or cytokines needed today.    5. Neutropenia/Thrombocytopenia: Impression: established - stable    The disease and prior treatment has toxicity on the bone marrow - no current signs of suppression    Plan: No cytokines or transfusions    6. RI/ Electrolyte Replacement: Impression: established - stable     Taking ample fluids and avoiding nephrotoxins. Lytes & Cr Okay, appears euvolemic. Appetite good and weight unchanged. No new medications needed.     Plan: Continue adequate fluid intake & balanced, nutritional diet   - Continue as much exercise as possible.  - No additional supplementation needed.    7. Osteopenia: Impression: established - stable:    The patient is at risk for fracture due to MM. We recommend Calcium and Vit D replacement. We also recommend an aggressive exercise program.    Plan: - No Biphosphonates  - Will need a bone density every 2 years.    8. Secondary Cancer Risk: impression: established - NEEDS close follow-up  The patient has high-risk of secondary malignancies. Needs close follow-up and routine cancer screening.    Plan: - Derm follow-up yearly  - Colonoscopy every 5 years  - Mammos per PMD when out of rehab.     9.  History of acute liver failure in the setting of persistent norovirus infection, malnutrition, CMV viremia. Liver function now returned to baseline, mild alk phos elevation is stable.   - continues on Rifaximin, ursodiol, lasix and spironolactone  - consider hepatology e-consult, but suspect can  stop all of these meds     Kathrynn Running NP  Hematology, Blood & Marrow Transplant, and [facility]  office [phone]  fax [phone]  12/08/22     Patient Care Team:  Eldridge Scot, DO as PCP - General (Family Medicine)  Trellis Paganini, MD (Hematology and Oncology)  Generic Provider Mychart  Sima Matas, MD (Palliative Care Medicine)  Ferdinand Cava III, MD (Hematology)  Azucena Cecil, NP as Nurse Practitioner (Family Medicine)    This patient has a high complexity problem: multiple myeloma, which is an incurable malignancy and poses a threat to life and bodily function.   Now with confirmed biochemical progression.     Regarding data complexity, I have reviewed the following tests: CBC, CMP, SPEP, light chains     Medical decision making: high risk.  The patient is considering resuming therapy for multiple myeloma.  The patient will likely start Ixazomib/Pom,/Dex and will continue on this therapy until progression or intolerance and therefore must  be closely monitored for toxicities, which may include low blood counts, infections, nausea, vomiting, diarrhea, fatigue, peripheral neuropathy.  The patient was extensively counseled on this and agrees to proceed with the treatment plan.     Patient was counseled to contact the clinic or the after-hours physician on call for any new symptoms or complications in the interim between now and her next follow up visit with  our clinic.      This is an independent service.   The available consultant for this service is Harrietta Guardian, MD.

## 2022-12-13 LAB — IMMUNOFIXATION ELECTROPHORESIS, SERUM
IgA, Serum: 37 mg/dL — ABNORMAL LOW (ref 87–352)
IgG: 1665 mg/dL — ABNORMAL HIGH (ref 586–1602)
IgM, Serum: 28 mg/dL (ref 26–217)

## 2022-12-13 LAB — PROTEIN ELECTROPHORESIS, SERUM
A/G Ratio: 0.9 (ref 0.7–1.7)
Albumin, sPEP: 3.4 g/dL (ref 2.9–4.4)
Alpha 1 Globulin: 0.3 g/dL (ref 0.0–0.4)
Alpha 2 Globulin: 0.7 g/dL (ref 0.4–1.0)
Beta Globulin: 1.2 g/dL (ref 0.7–1.3)
Gamma Globulin: 1.8 g/dL (ref 0.4–1.8)
Globulin, Total: 4 g/dL — ABNORMAL HIGH (ref 2.2–3.9)
M-Protein (monoclonal), Serum: 1.1 g/dL — ABNORMAL HIGH
Protein, Total, Serum / Plasma: 7.4 g/dL (ref 6.0–8.5)

## 2022-12-13 LAB — KAPPA AND LAMBDA FREE LIGHT CHAINS, SERUM
Kappa Light Chain, Serum, Free: 62.6 mg/L — ABNORMAL HIGH (ref 3.3–19.4)
Kappa/Lambda Ratio, Serum, Free: 4.89 — ABNORMAL HIGH (ref 0.26–1.65)
Lambda Light Chain, Serum, Free: 12.8 mg/L (ref 5.7–26.3)

## 2022-12-30 LAB — COMPREHENSIVE METABOLIC PANEL (BMP, AST, ALT, T.BILI, ALKP, TP ALB)
AST: 20 IU/L (ref 0–40)
Alanine transaminase: 11 IU/L (ref 0–32)
Albumin, Serum / Plasma: 4 g/dL (ref 3.9–4.9)
Albumin/Globulin Ratio: 1.5 (ref 1.2–2.2)
Alkaline Phosphatase: 114 IU/L (ref 44–121)
BUN/Creatinine Ratio (External Lab): 17 (ref 12–28)
Bilirubin, Total: 0.6 mg/dL (ref 0.0–1.2)
Calcium, total, Serum / Plasma: 9.3 mg/dL (ref 8.7–10.3)
Carbon Dioxide, Total: 26 mmol/L (ref 20–29)
Chloride, Serum / Plasma: 109 mmol/L — ABNORMAL HIGH (ref 96–106)
Creatinine: 0.63 mg/dL (ref 0.57–1.00)
Globulin, Total: 2.6 g/dL (ref 1.5–4.5)
Glucose, non-fasting: 86 mg/dL (ref 70–99)
Potassium, Serum / Plasma: 3.5 mmol/L (ref 3.5–5.2)
Protein, Total, Serum / Plasma: 6.6 g/dL (ref 6.0–8.5)
Sodium, Serum / Plasma: 137 mmol/L (ref 134–144)
Urea Nitrogen, Serum / Plasma: 11 mg/dL (ref 8–27)
eGFRcr: 100 mL/min/{1.73_m2} (ref >59)

## 2022-12-30 LAB — COMPLETE BLOOD COUNT WITH DIFFERENTIAL
% Basophils: 1 %
% Eosinophils: 3 %
% Immature Granulocytes: 0 %
% Lymphocytes: 31 %
% Monocytes: 11 %
% Neutrophils: 54 %
Abs Basophils: 0 10*3/uL (ref 0.0–0.2)
Abs Eosinophils: 0.1 10*3/uL (ref 0.0–0.4)
Abs Lymphocytes: 1.5 10*3/uL (ref 0.7–3.1)
Abs Monocytes: 0.5 10*3/uL (ref 0.1–0.9)
Abs Neutrophils: 2.6 10*3/uL (ref 1.4–7.0)
Hematocrit: 32.5 % — ABNORMAL LOW (ref 34.0–46.6)
Hemoglobin: 10.1 g/dL — ABNORMAL LOW (ref 11.1–15.9)
MCH: 29.5 pg (ref 26.6–33.0)
MCHC: 31.1 g/dL — ABNORMAL LOW (ref 31.5–35.7)
MCV: 95 fL (ref 79–97)
Platelet Count: 154 10*3/uL (ref 150–450)
RBC Count: 3.42 x10E6/uL — ABNORMAL LOW (ref 3.77–5.28)
RDW-CV: 15.6 % — ABNORMAL HIGH (ref 11.7–15.4)
WBC Count: 4.8 10*3/uL (ref 3.4–10.8)

## 2022-12-30 LAB — LACTATE DEHYDROGENASE, BLOOD: LDH, serum/plasma: 209 IU/L (ref 119–226)

## 2022-12-31 ENCOUNTER — Telehealth: Admit: 2023-01-04 | Payer: PRIVATE HEALTH INSURANCE | Attending: Nurse Practitioner

## 2022-12-31 DIAGNOSIS — C9002 Multiple myeloma in relapse: Secondary | ICD-10-CM

## 2022-12-31 LAB — PROTEIN ELECTROPHORESIS, SERUM
A/G Ratio: 0.9 (ref 0.7–1.7)
Albumin, sPEP: 3.1 g/dL (ref 2.9–4.4)
Alpha 1 Globulin: 0.3 g/dL (ref 0.0–0.4)
Alpha 2 Globulin: 0.6 g/dL (ref 0.4–1.0)
Beta Globulin: 1 g/dL (ref 0.7–1.3)
Gamma Globulin: 1.6 g/dL (ref 0.4–1.8)
Globulin, Total: 3.5 g/dL (ref 2.2–3.9)
M-Protein (monoclonal), Serum: 1.1 g/dL — ABNORMAL HIGH
Protein, Total, Serum / Plasma: 6.6 g/dL (ref 6.0–8.5)

## 2022-12-31 LAB — KAPPA AND LAMBDA FREE LIGHT CHAINS, SERUM
Kappa Light Chain, Serum, Free: 61.2 mg/L — ABNORMAL HIGH (ref 3.3–19.4)
Kappa/Lambda Ratio, Serum, Free: 4.47 — ABNORMAL HIGH (ref 0.26–1.65)
Lambda Light Chain, Serum, Free: 13.7 mg/L (ref 5.7–26.3)

## 2022-12-31 LAB — IMMUNOFIXATION ELECTROPHORESIS, SERUM
IgA, Serum: 60 mg/dL — ABNORMAL LOW (ref 87–352)
IgG: 1540 mg/dL (ref 586–1602)
IgM, Serum: 94 mg/dL (ref 26–217)

## 2022-12-31 MED ORDER — CYANOCOBALAMIN (VIT B-12) 1,000 MCG TABLET: 1000 ug | ORAL | Status: DC

## 2022-12-31 NOTE — Patient Instructions (Addendum)
PET - [facility]    Liver meds (letermovir, ursodiol, rifaximin)    Adverse events occurrence    Liver effect of ninlaro, pom    Liver ppx     Covid  vaccine    PT referral    Anemia work up

## 2022-12-31 NOTE — Progress Notes (Unsigned)
Ariel Braun  [id number]    Date of Service: 12/31/2022    Reason for visit / Chief Complaints:    Subjective   Ariel Braun is a 62 y.o. female with MM who is on VIDEO follow-up    I performed this consultation using real-time Telehealth tools, including a live video connection between my location and the patient's location. Prior to initiating the consultation, I obtained informed verbal consent to perform this consultation using Telehealth tools and answered all the questions about the Telehealth interaction. Patient in [place].    Events since the last visit: The patient has been OFF therapy. She had a prolonged hospitalization in early 2023 for ~ 5 months (CMV colitis, norovirus infection (chronic), FTT. Then readmitted ~6/8-7/13/23 - and stay at a SNF for an extended prior of time after discharge. Has been home since Nov 2023 and getting stronger. No longer needing home services. Symptoms are relatively stable.      Using cane outdoors for further distances. Stairs are still difficult, legs feel weak. Eating well, bowels moving fine.  No recent infection.     Occasional pain in hands and feet, worse in early morning, especially in thumbs, improved with movement. No numbness or tingling. Denies bone pain otherwise.    Has questions about meds. 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    Had dysuria, took OTC supplement (Azo brand), symptoms resolved. Denies bleeding or fever.    Prevnar 20 12/08/22. Non-eventful.     Ursodiol, rifaximin  Concerned about neuropathy  Concerned about edema  Off feeding tube 06/2022, taking all pills orally 07/2022        Oncology history:  IgG kappa myeloma, diagnosed 09/07/10     Significant bone involvement, 8% plasma cells, normal cyto 46XX, FISH showed only 3 copies 1q21, Stage 2 ISS (3.88 beta 2 microglobulin),IgG 4750, M-spike 3.2, kappa 586.9 mg/L.     RVD 10/2010 through April 2011 (? 4 cycles). Achieved VGPR with M-spike 0.4, IgG 886, kappa 27.6.      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     8 months post transplant, her M-spike continues to fall (0.2 on 11/26/11), she's doing great.     She has intermittent pain right hip, with x-ray showing old lesion. Would recommend PET/CT.     01/21/12: M-spike 0.3, kappa 16.5, IgG 810. PET/CT neg.     Swelling of feet and nausea (maybe HAE or zometa).     Then had HAE attacks x 2 with abdominal pain and vomiting.     Observation and no bisphos 2/2 her concerns.     08/11/12: Stable M-protein 0.2, IgG 729, kappa 12.3 in August and IgG 723.     09/29/12: MRI shoulder no myeloma. Kappa 17.     12/08/12: HAE attacks more frequent (weekly) but less severe. To receive C 1 esterase inhibitor (Berinert) prior to receiving pneumovax, TdAP, and hep B (12/11/12).     12/18/12. Slow rise in para-protein to M-spike 0.4     01/26/13: Did well with immunizations with Berinert pre-med. Hasn't used Audiological scientist since.     03/14/13: PET/CT no uptake. Meanwhile, M-spike of 0.4, IgG 937, kappa 18.5.     06/12/13: No HAE. M-spike 0.6, IgG 1270.     08/24/13: M-spike 0.6, IG 1330, kappa 32.3. Continue to observe.     03/29/14: MRI of hip no myeloma. M-spike  1.1.     06/11/14: PET normal.     08/05/14: Started Rev 15 mg 3/4. M-spike 1, IgG 1640, kappa 62.2     09/23/14: Seeing [name]. Less pain in feet since Rev.M-spike 0.8, IgG 1532, kappa 62.     02/17/15: Kappa up a bit to 79.6. Proposed adding Ixazomib to Rev dex if she gets to kappa 100.     06/10/15: Kappa 76.9 Stable on Rev. Although ANC however at about 0.8 to 1.     09/16/15: Kappa 69.9 (down) but ANC 0.8. Will reduce Revlimid to 15 mg qod to allow ANC to rise a bit.     12/09/15: Feels much better on Rev 15 mg qod, but M-spike up to 1.1 and IgG 1660. Will add dex 8 mg weekly and ativan for anxiety and sleep.     04/06/16: Now on dex 4 mg two days in a row, Rev 15 mg q o d.   06/04/16: Kappa 30.1. Cont. QOD Rev 15mg . PET scan ordered and  scheduled.     08/20/16: Feels fine. Myeloma labs stable with M-spike 1.1, IgG 1500, kappa 59. PET/CT neg from 7/28. Plan to continue Rev 15 mg qod, Dex 8 mg qwk.     12/14/16: Minimal left flank pain which is rare and intermittent. No hematuria. Myeloma labs better if anything with M-spike 0.9 and Kappa 44.2. Continue Rev 15 mg qod and dex 8 mg q week.     03/15/17: Pain gone. IgG 1530. Cont Rev 15 mg qod and dex 8 mg weekly.     06/21/17: Stable M-1.2, IgG 1480, KLC 58.9. Cont Rev 15 mg qod and dex 8 mg q wk.     09/16/17: M-1.2. KLC 52.6. Continue Rev 15 mg qod. Dex 8 mg q wk     09/16/17: Stable. M-0.2, kappa 59. Continue Rev 15 mg qod and dex 8 mg q wk. Note: no HAE episodes in 2 years.     03/19/18: Multiple PEs. On Eliquis. Hold Rev.     04/11/18: M-1.4, ZOX09. Resume Rev at 15 mg daily (3/4), then qod, Cont dex 8 mg qwk. Cont Eliquis. Pneumonia as well as PE's in early May. Repeat CT chest due to a mass (3.4 cm x 1.6 cm) at left posterior sulcus.     05/10/18: Rev 15 mg qod.     05/17/18: Repeat CT () with improvement and no mass or continued PEs. Mild intermittent pain L posterior chest (not pleuritic). M-1.3, IgG 1510, KLC 64.     08/08/18: Chest pain better. CT scan > resolution of PEs.     12/12/18: KLC 78. Rev 15 mg qod, dex 8 mg (4/4).     04/17/19: M-1.4, KLC 96.3. ? PD. Up Rev to 15 mg qd (3/4) and dex 8 mg wkly(4/4).       07/20/19: L hip pain. M-1.2, KLC 96. Cont. Rev 15 mg (3/4)/dex 8 mg (4/4).     08/21/19:   KLC 101, M-1.2.       12/11/19:  KLC 88. Stable. PCD vs local XRT.     01/01/20:  PET > sclerotic lesion R ilium/mid right SI jt with SUV 4.4.       02/12/20:  L hip pain better. KLC 73.     02/19/20: KLC 109 (PD).     04/08/20:  KLC 122.  Given rise in KLC and progression in R ilium will switch to Pom 3,   CTX 300, Dex 8 (3/4).  Switched care to Dr. Dolores Lory and went with DaraPomDex.       Past Medical History:   Diagnosis Date    Acid reflux disease     Anxiety     Depression     Hereditary angioedema (CMS  code)     Multiple myeloma, without mention of having achieved remission     IgG kappa    Myeloma (CMS code) 05/10/2011     Past medical, family and social histories, as well as medications and allergies, were reviewed and updated in the medical record as appropriate.    Current Outpatient Medications   Medication Sig Dispense Refill    cholecalciferol, vitamin D3, 1000 UNITS tablet 2 tablets (2,000 Units total) by Feeding Tube route daily      cyanocobalamin, Vitamin B12, 1,000 mcg/mL injection Inject 1 mL (1,000 mcg total) under the skin every 7 (seven) days Every Saturday      folic acid (FOLVITE) 1 mg tablet 1 tablet (1 mg total) by Feeding Tube route daily      furosemide (LASIX) 20 mg tablet 3 tablets (60 mg total) by Feeding Tube route daily Hold for SBP less than 90.      HYDROcodone-acetaminophen (NORCO) 5-325 mg tablet 1-2 tablets by Feeding Tube route every 6 (six) hours as needed for Pain 30 tablet 0    hydrOXYzine (ATARAX) 25 mg tablet 1 tablet (25 mg total) by Feeding Tube route every 4 (four) hours as needed for Anxiety      lansoprazole (PREVACID) 3 mg/mL suspension 10 mL (30 mg total) by Feeding Tube route every morning before breakfast      letermovir (PREVYMIS) tablet 1 tablet (480 mg total) by Feeding Tube route daily 1 tablet (480 mg) by feeding tube daily 90 tablet 3    loperamide (IMODIUM A-D) 2 mg tablet Take 2 tablets (4 mg) by feeding tube three times daily. (Patient not taking: Reported on 09/09/2022)      mirtazapine (REMERON) 30 mg tablet 1 tablet (30 mg total) by Per G Tube route nightly at bedtime      multivitamin with iron-minerals-folic acid (THERA M PLUS) 9 mg iron-400 mcg tablet 1 tablet by Feeding Tube route daily      ondansetron (ZOFRAN) 8 mg tablet 1 tablet (8 mg total) by Feeding Tube route in the morning and 1 tablet (8 mg total) at noon and 1 tablet (8 mg total) in the evening. (Patient not taking: Reported on 09/09/2022)      prochlorperazine (COMPAZINE) 5 mg tablet 1  tablet (5 mg total) by Feeding Tube route every 8 (eight) hours as needed for Nausea (Patient not taking: Reported on 09/09/2022)      rifAXIMin (XIFAXAN) 550 mg tablet Take 1 tablet (550 mg total) by mouth 2 (two) times daily 180 tablet 0    simethicone (MYLICON) 40 mg/0.6 mL drops 1.2 mL (80 mg total) by Feeding Tube route in the morning and 1.2 mL (80 mg total) at noon and 1.2 mL (80 mg total) in the evening and 1.2 mL (80 mg total) before bedtime. (Patient not taking: Reported on 09/09/2022)      simethicone (MYLICON) 40 mg/0.6 mL drops Take 2.4 mL (160 mg total) by mouth in the morning and 2.4 mL (160 mg total) at noon and 2.4 mL (160 mg total) in the evening and 2.4 mL (160 mg total) before bedtime. (Patient not taking: Reported on 09/09/2022)      spironolactone (ALDACTONE) 50 mg tablet 3  tablets (150 mg total) by Feeding Tube route daily Hold for SBP less than 90.      therapeutic multivitamin [facility]) tablet Take 1 tablet by mouth daily      thiamine mononitrate, vit B1, 100 mg tablet 1 tablet (100 mg total) by Feeding Tube route daily (Patient not taking: Reported on 09/09/2022)      ursodiol (ACTIGALL) 60 mg/mL syrup 10 mL (600 mg total) by Feeding Tube route 2 (two) times daily       No current facility-administered medications for this visit.       ALLERGIES  Allergies/Contraindications   Allergen Reactions    Diphenoxylate-Atropine Abdominal Pain     Severe stomach pain     Caffeine Anxiety     "Jitters"       PHYSICAL EXAM- LIMIT EXAM BY VIDEO  Objective: ECOG1  Constitutional:  Pleasant over video  HENT:  Normocephalic, Neck- Supple.   SKIN: No rash, noted Eyes:  PERRL, EOMI, sclera appear clear  Respiratory:  Chest excursions good Musculoskeletal: Moving all extremities.   Neurologic:  Alert & oriented x 3, nl affect, No focal deficits noted.       RESULTS: Labs in [facility] were reviewed. Lab results reviewed and interpretation provided to patient  Lab Results   Component Value Date    WBC Count 4.8  12/29/2022    Hemoglobin 10.1 (L) 12/29/2022    Hematocrit 32.5 (L) 12/29/2022    MCV 95 12/29/2022    Platelet Count 154 12/29/2022     Lab Results   Component Value Date    Creatinine 0.63 12/29/2022    Creatinine Whole Blood 0.8 06/05/2014         Outside Labs:    No results found.  ASSESSMENT & PLAN  1. Multiple Myeloma: Impression: established - stable - OFF therapy - requiring disease monitoring  This patient is off therapy and at high-risk for progression and requiring intensive monitoring. The patient's last therapy has been was Dara/Pom/Dex (04/2020 - 10/2021), stopped due to persistent norovirus, FTT, CMV colitis, CMV viremia, acute liver injury, encephalopathy, malnutrition requiring PET tube. But toxicity has prevented further therapy. No clinical signs of progression.     Has been off therapy for over a year now and continues to recover from a recent prolonged hospital stay that started in 11/2021 through 04/2022, then discharged to SNF and finally home since 09/2022.    We reviewed the labs from 12/03/22: IFE still pending, M protein 1.1 (0.8 a month ago, 0.2 a year ago).  She has biochemical progression.  This was discussed with Dr. Daphine Deutscher and recommends starting IPd.    Plan:   - Consider starting IPd   Ixazomib 2mg  once weekly for 3 of 4 weeks   Pom 2mg  daily for 3 of 4 weeks   Dex 20mg  once weekly for 3 of 4 week  - will need aspirin ppx with Pom  - plan for restaging PET now  - Continue to follow myeloma protein levels every 4 weeks  - Follow-up in 1 month with Harriett Sine and 3 months with Dr. Daphine Deutscher    2. Immunocompromised state/Infectious Disease concerns: Impression: established - immunocompromised from disease/treatment:  The patient is immunocompromised due to progressive myeloma.  Has been avoiding Covid exposure. Following guidelines. Will refer back to ID for follow-up    Requiring close follow-up for symptoms.  At risk for VZV reactivation and is currently on letermovir (history of CMV viremia).  No current signs of infection.  Will call with new signs of infection        Plan: - Continue prophylaxis  - consider ID e-consult to see if she can stop letermovir  - will need VZV ppx upon resuming myeloma therapy  - yearly flu vaccine  - Needs to shelter from Covid exposure  - The patient will call with any symptoms or signs of infection    3. Depression/Psychologic/Social Stressors: Impression: established - stable  The patient has cancer and has situation depression and is at risk for anxiety. Needs close follow-up    Currently, the attitude is good. There are no signs of anxiety or active depression. Mood appropriate, understands plan      Plan: No additional medications needed    4. Anemia: Impression: established - stable    The blood counts are stable, no changes. Next CBC in 4-6 weeks.       Plan: No transfusions or cytokines needed today.    5. Neutropenia/Thrombocytopenia: Impression: established - stable    The disease and prior treatment has toxicity on the bone marrow - no current signs of suppression    Plan: No cytokines or transfusions    6. RI/ Electrolyte Replacement: Impression: established - stable     Taking ample fluids and avoiding nephrotoxins. Lytes & Cr Okay, appears euvolemic. Appetite good and weight unchanged. No new medications needed.     Plan: Continue adequate fluid intake & balanced, nutritional diet   - Continue as much exercise as possible.  - No additional supplementation needed.    7. Osteopenia: Impression: established - stable:    The patient is at risk for fracture due to MM. We recommend Calcium and Vit D replacement. We also recommend an aggressive exercise program.    Plan: - No Biphosphonates  - Will need a bone density every 2 years.    8. Secondary Cancer Risk: impression: established - NEEDS close follow-up  The patient has high-risk of secondary malignancies. Needs close follow-up and routine cancer screening.    Plan: - Derm follow-up yearly  - Colonoscopy every 5  years  - Mammos per PMD when out of rehab.     9.  History of acute liver failure in the setting of persistent norovirus infection, malnutrition, CMV viremia. Liver function now returned to baseline, mild alk phos elevation is stable.   - continues on Rifaximin, ursodiol, lasix and spironolactone  - consider hepatology e-consult, but suspect can stop all of these meds     Royal Piedra NP  Hematology, Blood & Marrow Transplant, and Cellular Therapy  [place] [facility]  office [phone]  fax [phone]  12/31/22     Patient Care Team:  Trudie Reed, DO as PCP - General (Family Medicine)  Hilary Hertz, MD (Hematology and Oncology)  Generic Provider Mychart  Marzella Schlein, MD (Palliative Care Medicine)  Larina Earthly III, MD (Hematology)  Mills Koller, NP as Nurse Practitioner (Family Medicine)    This patient has a high complexity problem: multiple myeloma, which is an incurable malignancy and poses a threat to life and bodily function.   Now with confirmed biochemical progression.     Regarding data complexity, I have reviewed the following tests: CBC, CMP, SPEP, light chains     Medical decision making: high risk.  The patient is considering resuming therapy for multiple myeloma.  The patient will likely start Ixazomib/Pom,/Dex and will continue on this therapy until progression or intolerance and  therefore must  be closely monitored for toxicities, which may include low blood counts, infections, nausea, vomiting, diarrhea, fatigue, peripheral neuropathy.  The patient was extensively counseled on this and agrees to proceed with the treatment plan.     Patient was counseled to contact the clinic or the after-hours physician on call for any new symptoms or complications in the interim between now and her next follow up visit with our clinic.

## 2023-01-27 ENCOUNTER — Telehealth: Admit: 2023-01-27 | Discharge: 2023-01-27 | Payer: PRIVATE HEALTH INSURANCE

## 2023-01-27 DIAGNOSIS — B259 Cytomegaloviral disease, unspecified: Secondary | ICD-10-CM

## 2023-01-27 LAB — COMPLETE BLOOD COUNT WITH DIFFERENTIAL
% Basophils: 1 %
% Eosinophils: 3 %
% Immature Granulocytes: 1 %
% Lymphocytes: 38 %
% Monocytes: 9 %
% Neutrophils: 48 %
Abs Basophils: 0 10*3/uL (ref 0.0–0.2)
Abs Eosinophils: 0.1 10*3/uL (ref 0.0–0.4)
Abs Lymphocytes: 1.9 10*3/uL (ref 0.7–3.1)
Abs Monocytes: 0.5 10*3/uL (ref 0.1–0.9)
Abs Neutrophils: 2.4 10*3/uL (ref 1.4–7.0)
Hematocrit: 34.6 % (ref 34.0–46.6)
Hemoglobin: 11.4 g/dL (ref 11.1–15.9)
MCH: 30.2 pg (ref 26.6–33.0)
MCHC: 32.9 g/dL (ref 31.5–35.7)
MCV: 92 fL (ref 79–97)
Platelet Count: 154 10*3/uL (ref 150–450)
RBC Count: 3.78 x10E6/uL (ref 3.77–5.28)
RDW-CV: 15.3 % (ref 11.7–15.4)
WBC Count: 5 10*3/uL (ref 3.4–10.8)

## 2023-01-27 LAB — COMPREHENSIVE METABOLIC PANEL (BMP, AST, ALT, T.BILI, ALKP, TP ALB)
AST: 23 IU/L (ref 0–40)
Alanine transaminase: 14 IU/L (ref 0–32)
Albumin, Serum / Plasma: 4.2 g/dL (ref 3.9–4.9)
Albumin/Globulin Ratio: 1.6 (ref 1.2–2.2)
Alkaline Phosphatase: 120 IU/L (ref 44–121)
BUN/Creatinine Ratio (External Lab): 18 (ref 12–28)
Bilirubin, Total: 0.7 mg/dL (ref 0.0–1.2)
Calcium, total, Serum / Plasma: 9.7 mg/dL (ref 8.7–10.3)
Carbon Dioxide, Total: 24 mmol/L (ref 20–29)
Chloride, Serum / Plasma: 107 mmol/L — ABNORMAL HIGH (ref 96–106)
Creatinine: 0.65 mg/dL (ref 0.57–1.00)
Globulin, Total: 2.7 g/dL (ref 1.5–4.5)
Glucose, non-fasting: 82 mg/dL (ref 70–99)
Potassium, Serum / Plasma: 3.8 mmol/L (ref 3.5–5.2)
Protein, Total, Serum / Plasma: 6.9 g/dL (ref 6.0–8.5)
Sodium, Serum / Plasma: 145 mmol/L — ABNORMAL HIGH (ref 134–144)
Urea Nitrogen, Serum / Plasma: 12 mg/dL (ref 8–27)
eGFRcr: 99 mL/min/{1.73_m2} (ref >59)

## 2023-01-27 LAB — KAPPA AND LAMBDA FREE LIGHT CHAINS, SERUM
Kappa Light Chain, Serum, Free: 60.9 mg/L — ABNORMAL HIGH (ref 3.3–19.4)
Kappa/Lambda Ratio, Serum, Free: 4.83 — ABNORMAL HIGH (ref 0.26–1.65)
Lambda Light Chain, Serum, Free: 12.6 mg/L (ref 5.7–26.3)

## 2023-01-27 LAB — PROTEIN ELECTROPHORESIS, SERUM
A/G Ratio: 0.9 (ref 0.7–1.7)
Albumin, sPEP: 3.4 g/dL (ref 2.9–4.4)
Alpha 1 Globulin: 0.3 g/dL (ref 0.0–0.4)
Alpha 2 Globulin: 0.6 g/dL (ref 0.4–1.0)
Beta Globulin: 1.1 g/dL (ref 0.7–1.3)
Gamma Globulin: 1.7 g/dL (ref 0.4–1.8)
Globulin, Total: 3.6 g/dL (ref 2.2–3.9)
M-Protein (monoclonal), Serum: 1.2 g/dL — ABNORMAL HIGH
Protein, Total, Serum / Plasma: 7 g/dL (ref 6.0–8.5)

## 2023-01-27 LAB — IMMUNOFIXATION ELECTROPHORESIS, SERUM
IgA, Serum: 57 mg/dL — ABNORMAL LOW (ref 87–352)
IgG: 1754 mg/dL — ABNORMAL HIGH (ref 586–1602)
IgM, Serum: 107 mg/dL (ref 26–217)

## 2023-01-27 LAB — LACTATE DEHYDROGENASE, BLOOD: LDH, serum/plasma: 198 IU/L (ref 119–226)

## 2023-01-27 NOTE — Progress Notes (Signed)
Subjective    Ariel Braun is a 62 y.o. 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 Multiple Myeloma now progressing and planning to restart on therapy with imid, proteasome inhibitor, and dex. 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 Hematologist recommended indefinite letermovir ppx. Pt has been off of MM therapy but planned to restart MM therapy soon. Pt is wondering if she should continue on letermovir. Prior hematologist recommended lifelong letermovir ppx as of 04/2022. Pt reports that she would like to stay on letermovir for prevention. Feeling fatigued, otherwise no symptoms.    Allergies/Contraindications   Allergen Reactions    Diphenoxylate-Atropine Abdominal Pain     Severe stomach pain     Caffeine Anxiety     "Jitters"     Outpatient Encounter Medications as of 01/27/2023   Medication Sig Dispense Refill    cholecalciferol, vitamin D3, 1000 UNITS tablet 2 tablets (2,000 Units total) by Feeding Tube route daily (Patient taking differently: Take 2 tablets (2,000 Units total) by mouth daily)      cyanocobalamin, Vitamin B12, (VITAMIN B-12) 1,000 mcg tablet Take 1 tablet (1,000 mcg total) by mouth daily      folic acid (FOLVITE) 1 mg tablet 1 tablet (1 mg total) by Feeding Tube route daily (Patient taking differently: Take 1 tablet (1 mg total) by mouth daily)      furosemide (LASIX) 20 mg tablet 3 tablets (60 mg total) by Feeding Tube route daily Hold for SBP less than 90.       HYDROcodone-acetaminophen (NORCO) 5-325 mg tablet 1-2 tablets by Feeding Tube route every 6 (six) hours as needed for Pain 30 tablet 0    hydrOXYzine (ATARAX) 25 mg tablet 1 tablet (25 mg total) by Feeding Tube route every 4 (four) hours as needed for Anxiety      lansoprazole (PREVACID) 3 mg/mL suspension 10 mL (30 mg total) by Feeding Tube route every morning before breakfast      letermovir (PREVYMIS) tablet 1 tablet (480 mg total) by Feeding Tube route daily 1 tablet (480 mg) by feeding tube daily 90 tablet 3    loperamide (IMODIUM A-D) 2 mg tablet Take 2 tablets (4 mg) by feeding tube three times daily. (Patient not taking: Reported on 09/09/2022)      mirtazapine (REMERON) 30 mg tablet 1 tablet (30 mg total) by Per G Tube route nightly at bedtime      multivitamin with iron-minerals-folic acid (THERA M PLUS) 9 mg iron-400 mcg tablet 1 tablet by Feeding Tube route daily      ondansetron (ZOFRAN) 8 mg tablet 1 tablet (8 mg total) by Feeding Tube route in the morning and 1 tablet (8 mg total) at noon and 1 tablet (8 mg total) in the evening. (Patient not taking: Reported on 09/09/2022)      prochlorperazine (COMPAZINE) 5 mg tablet 1 tablet (5 mg total) by Feeding Tube route every 8 (eight) hours as needed for Nausea (Patient not  taking: Reported on 09/09/2022)      rifAXIMin (XIFAXAN) 550 mg tablet Take 1 tablet (550 mg total) by mouth 2 (two) times daily 180 tablet 0    simethicone (MYLICON) 40 [id number] mL drops 1.2 mL (80 mg total) by Feeding Tube route in the morning and 1.2 mL (80 mg total) at noon and 1.2 mL (80 mg total) in the evening and 1.2 mL (80 mg total) before bedtime. (Patient not taking: Reported on 09/09/2022)      simethicone (MYLICON) 40 [id number] mL drops Take 2.4 mL (160 mg total) by mouth in the morning and 2.4 mL (160 mg total) at noon and 2.4 mL (160 mg total) in the evening and 2.4 mL (160 mg total) before bedtime. (Patient not taking: Reported on 09/09/2022)      spironolactone (ALDACTONE) 50  mg tablet 3 tablets (150 mg total) by Feeding Tube route daily Hold for SBP less than 90.      therapeutic multivitamin (THERAGRAN) tablet Take 1 tablet by mouth daily      thiamine mononitrate, vit B1, 100 mg tablet 1 tablet (100 mg total) by Feeding Tube route daily (Patient taking differently: Take 1 tablet (100 mg total) by mouth daily)      ursodiol (ACTIGALL) 60 mg/mL syrup 10 mL (600 mg total) by Feeding Tube route 2 (two) times daily       No facility-administered encounter medications on file as of 01/27/2023.     Past Medical History:   Diagnosis Date    Acid reflux disease     Anxiety     Depression     Hereditary angioedema (CMS code)     Multiple myeloma, without mention of having achieved remission     IgG kappa    Myeloma (CMS code) 05/10/2011       Past Surgical History:   Procedure Laterality Date    AUTOLOGOUS STEM CELL TRANSPLANTATION  05/12/11    BREAST CYST EXCISION  1981     Family History   Problem Relation Name Age of Onset    Diabetes Mother      Hypertension Mother      Colon cancer Father      Hypertension Brother ##Brother1        Social History     Tobacco Use    Smoking status: Unknown   Substance and Sexual Activity    Sexual activity: Not Currently           Objective          Physical Exam  Video visit  Appears well, no distress       Review of Prior Testing    CBC        12/29/22  0820 12/03/22  0903 11/03/22  1039   WBC 4.8 6.4 4.0   HGB 10.1* 12.0 10.0*   HCT 32.5* 37.8 31.5*   PLT 154 200 196   NEUTA 2.6 3.8 2.1   LYMA 1.5 1.9 1.4   MOA 0.5 0.6 0.4   EOA 0.1 0.1 0.1   BASOA 0.0 0.0 0.0       Chem7        12/29/22  0820 12/03/22  0903 11/03/22  1039   NA 137 140 141   K 3.5 3.8 4.0   CL 109* 105 108*   CO2 26 24 25    BUN 11 17 10    CREAT 0.63 0.78 0.60   GLU 86 90 81  Liver Panel        12/29/22  0820 12/03/22  0903 12/03/22  0902 11/03/22  1039 05/07/22  0609 05/06/22  0542 05/05/22  0548 05/04/22  0538   AST 20 23  --  24   < > 53* 49* 59*   ALT 11 13  --  12   < > 22 20 25     ALKP 114 133*  --  121   < > 220* 211* 244*   TBILI 0.6 0.7  --  0.4   < > 0.7 0.7 0.7   TP 6.6  6.6 6.9 7.4 6.4   < >  --   --   --    ALB 4.0 4.2  --  3.5*   < >  --   --   --    GGT  --   --   --   --   --  93* 90* 111*    < > = values in this interval not displayed.       CRP  No results found in last 365 days    ESR  No results found in last 365 days      Microbiology  05/25/22 CMV PCR detected <30  12/13/21 CMV PCR 443K    Radiology Results      Antimicrobial History:  Letermovir 04/2022 - present        Assessment and Plan       MYRA PRUNTY is a 62 y.o. 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 Colon bx with CMV +, liver bx neg for CMV, CSF CMV PCR neg. Pt treated w ganciclovir --> valganciclovir 12/2021, then transitioned to indefinite letermovir, which she remains on. Lifelong indefinite CMV ppx is not a typical approach, given the risks of development of resistance especially as letermovir has  low barrier to resistance already. At this time would recommend pt can stop letermovir - has only had one episode of CMV colitis in the past. Although also not a typical approach, can check CMV PCRs weekly after starting on chemotherapy, can check for several weeks and if not rising, then can likely stop checking weekly CMV PCRs after a period of time.   --Will discuss w oncology team stopping letermovir  --Monitor with weekly CMV PCRs after pt starts chemotherapy. If no concerns for CMV viremia after several weeks, can then likely stop preemptive monitoring   --HSV/VZV ppx per heme/onc protocols     I reviewed external records from 2 providers outside my specialty or healthcare organization as summarized in the note.    IMarilynne Halsted, MD, spent a total of 68 minutes on this patient's  care on the day of their visit excluding time spent related to any billed procedures. This time includes time spent with the patient as well as time spent documenting in the medical record, reviewing patient's records and tests, obtaining history, placing orders, communicating with other healthcare professionals, counseling the patient, family, or caregiver, and/or care coordination for the diagnoses above.      I performed this evaluation using real-time telehealth tools, including a live video Zoom connection between my location and the patient's location. Prior to initiating, the patient consented to perform this evaluation using telehealth tools.

## 2023-01-27 NOTE — Patient Instructions (Signed)
Hi Ariel Braun  As we discussed, I believe we can likely stop your letermovir, but I will discuss with your hematology team further.    We can check CMV levels now and then start weekly lab checks after you start chemotherapy. This way, if the CMV starts to rise when you are no longer on treatment, we can hopefully catch the uptrending virus to treat prior to symptoms/harm developing    Marcelyn Bruins, MD  Infectious Diseases

## 2023-01-28 ENCOUNTER — Telehealth: Admit: 2023-02-01 | Discharge: 2023-02-02 | Payer: PRIVATE HEALTH INSURANCE | Attending: Nurse Practitioner

## 2023-01-28 DIAGNOSIS — C9002 Multiple myeloma in relapse: Secondary | ICD-10-CM

## 2023-01-28 MED ORDER — APIXABAN 5 MG TABLET
5 | Freq: Two times a day (BID) | ORAL | Status: AC
Start: 2023-01-28 — End: ?

## 2023-01-28 NOTE — Progress Notes (Unsigned)
Ariel Braun  [id number]    Date of Service: 01/28/2023    Reason for visit / Chief Complaints:    Subjective   Ariel Braun is a 62 y.o. female with MM who is on VIDEO follow-up    I performed this consultation using real-time Telehealth tools, including a live video connection between my location and the patient's location. Prior to initiating the consultation, I obtained informed verbal consent to perform this consultation using Telehealth tools and answered all the questions about the Telehealth interaction. Patient in [place].    Events since the last visit: The patient has been OFF therapy. She had a prolonged hospitalization in early 2023 for ~ 5 months (CMV colitis, norovirus infection (chronic), FTT. Then readmitted ~6/8-7/13/23 - and stay at a SNF for an extended period of time after discharge. Off feeding tube since 06/2022, taking all pills orally since 07/2022. Has been home since Nov 2023 and getting stronger. No longer needing home services. Symptoms are relatively stable.     Using cane outdoors for further distances. Stairs are still difficult, legs feel weak. Eating well, bowels moving fine.  No recent infection.     Occasional pain in hands and feet, worse in early morning, especially in thumbs, improved with movement. Hands feel burning hot in AM, pain in wrist, fingers and thumbs.  Joints click. Occasionally extends Sx present x 1 month. No numbness or tingling. Denies bone pain otherwise.    Denies neck pain, occasional clicking in neck.     Has questions about meds. She continues on rifaximin and ursodiol for liver and letemovir for CMV ppx.    Had dysuria, took OTC supplement (Azo brand) and cranberry supplement, symptoms resolved. Denies bleeding or fever.    S/p Prevnar-20 12/08/22. Non-eventful. Again, asking if she should get covid vaccine..    She is open to restarting treatment, but have concerns regarding risk of neuropathy and edema, risk of liver toxicity.         Oncology  history:  IgG kappa myeloma, diagnosed 09/07/10     Significant bone involvement, 8% plasma cells, normal cyto 46XX, FISH showed only 3 copies 1q21, Stage 2 ISS (3.88 beta 2 microglobulin),IgG 4750, M-spike 3.2, kappa 586.9 mg/L.     RVD 10/2010 through April 2011 (? 4 cycles). Achieved VGPR with M-spike 0.4, IgG 886, kappa 27.6.     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     8 months post transplant, her M-spike continues to fall (0.2 on 11/26/11), she's doing great.     She has intermittent pain right hip, with x-ray showing old lesion. Would recommend PET/CT.     01/21/12: M-spike 0.3, kappa 16.5, IgG 810. PET/CT neg.     Swelling of feet and nausea (maybe HAE or zometa).     Then had HAE attacks x 2 with abdominal pain and vomiting.     Observation and no bisphos 2/2 her concerns.     08/11/12: Stable M-protein 0.2, IgG 729, kappa 12.3 in August and IgG 723.     09/29/12: MRI shoulder no myeloma. Kappa 17.     12/08/12: HAE attacks more frequent (weekly) but less severe. To receive C 1 esterase inhibitor (Berinert) prior to receiving pneumovax, TdAP, and hep B (12/11/12).     12/18/12. Slow rise in para-protein to M-spike 0.4     01/26/13: Did well with immunizations with Berinert pre-med.  Hasn't used Audiological scientist since.     03/14/13: PET/CT no uptake. Meanwhile, M-spike of 0.4, IgG 937, kappa 18.5.     06/12/13: No HAE. M-spike 0.6, IgG 1270.     08/24/13: M-spike 0.6, IG 1330, kappa 32.3. Continue to observe.     03/29/14: MRI of hip no myeloma. M-spike 1.1.     06/11/14: PET normal.     08/05/14: Started Rev 15 mg 3/4. M-spike 1, IgG 1640, kappa 62.2     09/23/14: Seeing [name]. Less pain in feet since Rev.M-spike 0.8, IgG 1532, kappa 62.     02/17/15: Kappa up a bit to 79.6. Proposed adding Ixazomib to Rev dex if she gets to kappa 100.     06/10/15: Kappa 76.9 Stable on Rev. Although ANC however at about 0.8 to 1.     09/16/15: Kappa 69.9 (down) but ANC 0.8. Will  reduce Revlimid to 15 mg qod to allow ANC to rise a bit.     12/09/15: Feels much better on Rev 15 mg qod, but M-spike up to 1.1 and IgG 1660. Will add dex 8 mg weekly and ativan for anxiety and sleep.     04/06/16: Now on dex 4 mg two days in a row, Rev 15 mg q o d.   06/04/16: Kappa 30.1. Cont. QOD Rev 15mg . PET scan ordered and scheduled.     08/20/16: Feels fine. Myeloma labs stable with M-spike 1.1, IgG 1500, kappa 59. PET/CT neg from 7/28. Plan to continue Rev 15 mg qod, Dex 8 mg qwk.     12/14/16: Minimal left flank pain which is rare and intermittent. No hematuria. Myeloma labs better if anything with M-spike 0.9 and Kappa 44.2. Continue Rev 15 mg qod and dex 8 mg q week.     03/15/17: Pain gone. IgG 1530. Cont Rev 15 mg qod and dex 8 mg weekly.     06/21/17: Stable M-1.2, IgG 1480, KLC 58.9. Cont Rev 15 mg qod and dex 8 mg q wk.     09/16/17: M-1.2. KLC 52.6. Continue Rev 15 mg qod. Dex 8 mg q wk     09/16/17: Stable. M-0.2, kappa 59. Continue Rev 15 mg qod and dex 8 mg q wk. Note: no HAE episodes in 2 years.     03/19/18: Multiple PEs. On Eliquis. Hold Rev.     04/11/18: M-1.4, [id number]. Resume Rev at 15 mg daily (3/4), then qod, Cont dex 8 mg qwk. Cont Eliquis. Pneumonia as well as PE's in early May. Repeat CT chest due to a mass (3.4 cm x 1.6 cm) at left posterior sulcus.     05/10/18: Rev 15 mg qod.     05/17/18: Repeat CT ([place]) with improvement and no mass or continued PEs. Mild intermittent pain L posterior chest (not pleuritic). M-1.3, IgG 1510, KLC 64.     08/08/18: Chest pain better. CT scan > resolution of PEs.     12/12/18: KLC 78. Rev 15 mg qod, dex 8 mg (4/4).     04/17/19: M-1.4, KLC 96.3. ? PD. Up Rev to 15 mg qd (3/4) and dex 8 mg wkly(4/4).       07/20/19: L hip pain. M-1.2, KLC 96. Cont. Rev 15 mg (3/4)/dex 8 mg (4/4).     08/21/19:   KLC 101, M-1.2.       12/11/19:  KLC 88. Stable. PCD vs local XRT.     01/01/20:  PET > sclerotic lesion R ilium/mid right SI jt with SUV  4.4.       02/12/20:  L hip pain better. KLC 73.      02/19/20: KLC 109 (PD).     04/08/20:  KLC 122.  Given rise in KLC and progression in R ilium will switch to Pom 3,   CTX 300, Dex 8 (3/4).    Switched care to Dr. Dolores Lory and went with DaraPomDex.       Past Medical History:   Diagnosis Date    Acid reflux disease     Anxiety     Depression     Hereditary angioedema (CMS code)     Multiple myeloma, without mention of having achieved remission     IgG kappa    Myeloma (CMS code) 05/10/2011     Past medical, family and social histories, as well as medications and allergies, were reviewed and updated in the medical record as appropriate.    Current Outpatient Medications   Medication Sig Dispense Refill    cholecalciferol, vitamin D3, 1000 UNITS tablet 2 tablets (2,000 Units total) by Feeding Tube route daily (Patient taking differently: Take 2 tablets (2,000 Units total) by mouth daily)      cyanocobalamin, Vitamin B12, (VITAMIN B-12) 1,000 mcg tablet Take 1 tablet (1,000 mcg total) by mouth daily      folic acid (FOLVITE) 1 mg tablet 1 tablet (1 mg total) by Feeding Tube route daily (Patient taking differently: Take 1 tablet (1 mg total) by mouth daily)      furosemide (LASIX) 20 mg tablet 3 tablets (60 mg total) by Feeding Tube route daily Hold for SBP less than 90.      HYDROcodone-acetaminophen (NORCO) 5-325 mg tablet 1-2 tablets by Feeding Tube route every 6 (six) hours as needed for Pain 30 tablet 0    hydrOXYzine (ATARAX) 25 mg tablet 1 tablet (25 mg total) by Feeding Tube route every 4 (four) hours as needed for Anxiety      lansoprazole (PREVACID) 3 mg/mL suspension 10 mL (30 mg total) by Feeding Tube route every morning before breakfast      letermovir (PREVYMIS) tablet 1 tablet (480 mg total) by Feeding Tube route daily 1 tablet (480 mg) by feeding tube daily 90 tablet 3    loperamide (IMODIUM A-D) 2 mg tablet Take 2 tablets (4 mg) by feeding tube three times daily. (Patient not taking: Reported on 09/09/2022)      mirtazapine (REMERON) 30 mg tablet 1  tablet (30 mg total) by Per G Tube route nightly at bedtime      multivitamin with iron-minerals-folic acid (THERA M PLUS) 9 mg iron-400 mcg tablet 1 tablet by Feeding Tube route daily      ondansetron (ZOFRAN) 8 mg tablet 1 tablet (8 mg total) by Feeding Tube route in the morning and 1 tablet (8 mg total) at noon and 1 tablet (8 mg total) in the evening. (Patient not taking: Reported on 09/09/2022)      prochlorperazine (COMPAZINE) 5 mg tablet 1 tablet (5 mg total) by Feeding Tube route every 8 (eight) hours as needed for Nausea (Patient not taking: Reported on 09/09/2022)      rifAXIMin (XIFAXAN) 550 mg tablet Take 1 tablet (550 mg total) by mouth 2 (two) times daily 180 tablet 0    simethicone (MYLICON) 40 mg/0.6 mL drops 1.2 mL (80 mg total) by Feeding Tube route in the morning and 1.2 mL (80 mg total) at noon and 1.2 mL (80 mg total) in the evening and 1.2  mL (80 mg total) before bedtime. (Patient not taking: Reported on 09/09/2022)      simethicone (MYLICON) 40 mg/0.6 mL drops Take 2.4 mL (160 mg total) by mouth in the morning and 2.4 mL (160 mg total) at noon and 2.4 mL (160 mg total) in the evening and 2.4 mL (160 mg total) before bedtime. (Patient not taking: Reported on 09/09/2022)      spironolactone (ALDACTONE) 50 mg tablet 3 tablets (150 mg total) by Feeding Tube route daily Hold for SBP less than 90.      therapeutic multivitamin (THERAGRAN) tablet Take 1 tablet by mouth daily      thiamine mononitrate, vit B1, 100 mg tablet 1 tablet (100 mg total) by Feeding Tube route daily (Patient taking differently: Take 1 tablet (100 mg total) by mouth daily)      ursodiol (ACTIGALL) 60 mg/mL syrup 10 mL (600 mg total) by Feeding Tube route 2 (two) times daily       No current facility-administered medications for this visit.       ALLERGIES  Allergies/Contraindications   Allergen Reactions    Diphenoxylate-Atropine Abdominal Pain     Severe stomach pain     Caffeine Anxiety     "Jitters"       PHYSICAL EXAM-  LIMIT EXAM BY VIDEO  Objective: ECOG1  Constitutional:  Pleasant over video  HENT:  Normocephalic, Neck- Supple.   SKIN: No rash, noted Eyes:  PERRL, EOMI, sclera appear clear  Respiratory:  Chest excursions good Musculoskeletal: Moving all extremities.   Neurologic:  Alert & oriented x 3, nl affect, No focal deficits noted.       RESULTS: Labs in [facility] were reviewed. Lab results reviewed and interpretation provided to patient  Lab Results   Component Value Date    WBC Count 5.0 01/26/2023    Hemoglobin 11.4 01/26/2023    Hematocrit 34.6 01/26/2023    MCV 92 01/26/2023    Platelet Count 154 01/26/2023     Lab Results   Component Value Date    Creatinine 0.65 01/26/2023    Creatinine Whole Blood 0.8 06/05/2014         Outside Labs:    No results found.  ASSESSMENT & PLAN  1. Multiple Myeloma: Impression: established - stable - OFF therapy - requiring disease monitoring  This patient is off therapy and at high-risk for progression and requiring intensive monitoring. The patient's last therapy has been was Dara/Pom/Dex (04/2020 - 10/2021), stopped due to persistent norovirus, FTT, CMV colitis, CMV viremia, acute liver injury, encephalopathy, malnutrition requiring PET tube. But toxicity has prevented further therapy. No clinical signs of progression.     Has been off therapy for over a year now and continues to recover from a recent prolonged hospital stay that started in 11/2021 through 04/2022, then discharged to SNF and finally home since 09/2022.    We reviewed the labs from 12/03/22: IFE still pending, M protein 1.1 (0.8 a month ago, 0.2 a year ago).  She has biochemical progression.  This was discussed with Dr. Daphine Deutscher and recommends starting IPd.    Plan:   - Consider starting IPd   Ixazomib 2mg  once weekly for 3 of 4 weeks   Pom 2mg  daily for 3 of 4 weeks   Dex 20mg  once weekly for 3 of 4 week  - discussed potential side effects, patient concerned about risk for neuropathy and edema  - will need aspirin ppx with  Pom  - plan for  restaging PET now (ordered for [facility])  - Continue to follow myeloma protein levels every 4 weeks  - Follow-up in 1 month with Harriett Sine and 2 months with Dr. Daphine Deutscher    2. Immunocompromised state/Infectious Disease concerns: Impression: established - immunocompromised from disease/treatment:  The patient is immunocompromised due to progressive myeloma.  Has been avoiding Covid exposure. Following guidelines. Will refer back to ID for follow-up    Requiring close follow-up for symptoms.  At risk for VZV reactivation and is currently on letermovir (history of CMV viremia). Seen for follow up by ID, Dr Lurene Shadow.   "Lifelong indefinite CMV ppx is not a typical approach, given the risks of development of resistance especially as letermovir has  low barrier to resistance already. At this time would recommend pt can stop letermovir - has only had one episode of CMV colitis in the past. Although also not a typical approach, can check CMV PCRs weekly after starting on chemotherapy, can check for several weeks and if not rising, then can likely stop checking weekly CMV PCRs after a period of time."    No current signs of infection. Will call with new signs of infection        Plan: - Continue prophylaxis  - Agree with stopping letermovir and checking baseline CMV and then weekly CMV initially when resuming treatment  - will need VZV ppx upon resuming myeloma therapy  - yearly flu vaccine  - Needs to shelter from Covid exposure  - The patient will call with any symptoms or signs of infection  - History of PE and most recently DVT 08/2022 in the setting of rehab at SNF - continues on eliquis 5mg  bid    3. Depression/Psychologic/Social Stressors: Impression: established - stable  The patient has cancer and has situation depression and is at risk for anxiety. Needs close follow-up    Currently, the attitude is good. There are no signs of anxiety or active depression. Mood appropriate,  understands plan      Plan: No additional medications needed    4. Anemia: Impression: established - stable    The blood counts are stable, no changes. Next CBC in 4-6 weeks.       Plan: No transfusions or cytokines needed today.    5. Neutropenia/Thrombocytopenia: Impression: established - stable    The disease and prior treatment has toxicity on the bone marrow - no current signs of suppression    Plan: No cytokines or transfusions    6. RI/ Electrolyte Replacement: Impression: established - stable     Taking ample fluids and avoiding nephrotoxins. Lytes & Cr Okay, appears euvolemic. Appetite good and weight unchanged. No new medications needed.     Plan: Continue adequate fluid intake & balanced, nutritional diet   - Continue as much exercise as possible.  - No additional supplementation needed.    7. Osteopenia: Impression: established - stable:    The patient is at risk for fracture due to MM. We recommend Calcium and Vit D replacement. We also recommend an aggressive exercise program.    Plan: - No Biphosphonates  - Will need a bone density every 2 years.    8. Secondary Cancer Risk: impression: established - NEEDS close follow-up  The patient has high-risk of secondary malignancies. Needs close follow-up and routine cancer screening.    Plan: - Derm follow-up yearly  - Colonoscopy every 5 years  - Mammos per PMD when out of rehab.     9.  History of acute liver failure in the setting of persistent norovirus infection, malnutrition, CMV viremia. Liver function now returned to baseline, mild alk phos elevation is stable.   - continues on Rifaximin, ursodiol, lasix and spironolactone  - hepatology referral placed, but suspect can stop all of these meds     Royal Piedra NP  Hematology, Blood & Marrow Transplant, and Cellular Therapy  [facility]  office [phone]  fax [phone]  01/28/23     Patient Care Team:  Trudie Reed, DO as PCP - General  (Family Medicine)  Hilary Hertz, MD (Hematology and Oncology)  Generic Provider Mychart  Marzella Schlein, MD (Palliative Care Medicine)  Larina Earthly III, MD (Hematology)  Mills Koller, NP as Nurse Practitioner (Family Medicine)    This patient has a high complexity problem: multiple myeloma, which is an incurable malignancy and poses a threat to life and bodily function.   Now with confirmed biochemical progression.     Regarding data complexity, I have reviewed the following tests: CBC, CMP, SPEP, light chains     Medical decision making: high risk.  The patient is considering resuming therapy for multiple myeloma.  The patient will likely start Ixazomib/Pom,/Dex and will continue on this therapy until progression or intolerance and therefore must  be closely monitored for toxicities, which may include low blood counts, infections, nausea, vomiting, diarrhea, fatigue, peripheral neuropathy.  The patient was extensively counseled on this and agrees to proceed with the treatment plan.     Patient was counseled to contact the clinic or the after-hours physician on call for any new symptoms or complications in the interim between now and her next follow up visit with our clinic.      This is an independent service.   The available consultant for this service is Shaune Pascal, MD.

## 2023-01-28 NOTE — Patient Instructions (Addendum)
[facility] -- xray    Ask dr Alfredia Ferguson about MMR    Recommend Tdap now locally

## 2023-02-01 LAB — FOLATE: Folate (Folic Acid), Serum: >20 ng/mL (ref >3.0)

## 2023-02-01 LAB — IRON, % SATURATION AND TRANSFERRIN OR TIBC (DEPENDING ON THE LAB)
Iron, serum: 82 ug/dL
Transferrin Saturation: 20 % Saturation
Transferrin, Serum/Plasma: 294 mg/dL

## 2023-02-01 LAB — VITAMIN B12: Vitamin B12: 895 pg/mL (ref 232–1245)

## 2023-02-01 LAB — RETICULOCYTE COUNT: % Retic: 1.3 % (ref 0.6–2.6)

## 2023-02-01 LAB — FERRITIN: Ferritin, Serum/Plasma: 42 ng/mL (ref 15–150)

## 2023-02-01 MED ORDER — ACYCLOVIR 400 MG TABLET
400 mg | ORAL_TABLET | Freq: Two times a day (BID) | ORAL | 3 refills | Status: DC
Start: 2023-02-01 — End: 2024-04-23

## 2023-02-05 LAB — MEASLES ANTIBODY, IGG, SERUM: Rubeola Antibody, IgG: >300 AU/mL (ref >16.4)

## 2023-02-05 LAB — MUMPS VIRUS ANTIBODY, IGG: Mumps Virus Ab (IgG): >300 AU/mL (ref >10.9)

## 2023-02-05 LAB — RUBELLA ANTIBODY IGG: Rubella Antibody: <0.9 index — ABNORMAL LOW (ref >0.99)

## 2023-02-07 LAB — CYTOMEGALOVIRUS DNA, QUANTITATIVE PCR, PLASMA: Cytomegalovirus DNA, Quantitative PCR: NEGATIVE IU/mL

## 2023-02-17 LAB — CYTOMEGALOVIRUS DNA, QUANTITATIVE PCR, PLASMA: Cytomegalovirus DNA, Quantitative PCR: NEGATIVE IU/mL

## 2023-02-24 ENCOUNTER — Telehealth: Payer: PRIVATE HEALTH INSURANCE | Attending: Physician

## 2023-02-24 DIAGNOSIS — C9000 Multiple myeloma not having achieved remission: Secondary | ICD-10-CM

## 2023-02-24 NOTE — Patient Instructions (Addendum)
IxazCytMedrol  - Ixazomib 2 mg Day 1, 8, 15  - Cytoxan 300 mg Day 1, 8, 15  - Medrol 8 mg Day 1, 8, 15    Acyclovir,   To prevent neuropathy  - Vit B6 50 mg  - Alpha-lipoic acid 200 mg daily  - L-Carnitine 500 mg daily  - Zofran as needed for nausea    Weekly blood tests - CBC, CMV pcr  Every 2 weeks - CMP  Every 4 weeks - Myeloma labs    Please let us know if there are any symptoms of infection  Can start Iron liquid

## 2023-02-24 NOTE — Progress Notes (Signed)
Ariel Braun  [id number]    Date of Service: 02/24/2023    Reason for visit / Chief Complaints:    Subjective   Ariel Braun is a 62 y.o. female with MM who is on VIDEO follow-up    I performed this consultation using real-time Telehealth tools, including a live video connection between my location and the patient's location. Prior to initiating the consultation, I obtained informed verbal consent to perform this consultation using Telehealth tools and answered all the questions about the Telehealth interaction. Patient in [place].    Events since the last visit: The patient has been OFF therapy. She had a prolonged hospitalization in early 2023 for ~ 5 months (CMV colitis, norovirus infection (chronic), FTT). Then readmitted ~6/8-7/13/23 - and admitted to SNF for an extended period of time after discharge. Off feeding tube since 06/2022, taking all pills orally since 07/2022. Has been home since Nov 2023. Getting better. Has had some soreness on fingers (jts) but has been using cane to walk. Legs better but not at 100%, no falls. No fevers or infectious symptoms. Appetite OK. But overall doing better. No new bone pain or signs of progressive MM. No kidney/urinary difficuties. No f/chills, URI symptoms, sinus congestion, sore throat or other infectious symptoms. No N/V/dysuria/diarrhea.  No CP, palp.  No bleeding or bruising.    Oncology history:  DX:   IgG kappa myeloma, diagnosed 09/07/10   - BMBx: 8% plasma cells, normal cyto 46XX, FISH showed only 3 copies 1q21, Stage 2 ISS (3.88 beta 2 microglobulin),IgG 4750, M-spike 3.2, kappa 586.9 mg/L.     TREATMENT  1. LINE1: RVD 10/2010 through April 2011 (? 4 cycles). Achieved VGPR with M-spike 0.4, IgG 886, kappa 27.6.   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 M-spike 0.6, IgG 1270.     3. LINE2: 08/05/14: Started Rev 15 mg 3/4. M-spike 1, IgG 1640, kappa 62.2 - SD    Biochemical PD 04/17/19: M-1.4,  KLC 96.3. ? PD. Up Rev to 15 mg qd (3/4) and dex 8 mg wkly(4/4).       07/20/19: L hip pain. M-1.2, KLC 96. Cont. Rev 15 mg (3/4)/dex 8 mg (4/4).     4. LINE4: 12/2019: DaraPomDex.       Past Medical History:   Diagnosis Date    Acid reflux disease     Anxiety     Depression     Hereditary angioedema (CMS code)     Multiple myeloma, without mention of having achieved remission     IgG kappa    Myeloma (CMS code) 05/10/2011     Past medical, family and social histories, as well as medications and allergies, were reviewed and updated in the medical record as appropriate.    Current Outpatient Medications   Medication Sig Dispense Refill    acyclovir (ZOVIRAX) 400 mg tablet Take 1 tablet (400 mg total) by mouth in the morning and 1 tablet (400 mg total) in the evening. 180 tablet 3    apixaban (ELIQUIS) 5 mg tablet Take 1 tablet (5 mg total) by mouth 2 (two) times daily      cyanocobalamin, Vitamin B12, (VITAMIN B-12) 1,000 mcg tablet Take 1 tablet (1,000 mcg total) by mouth daily      mirtazapine (REMERON) 30 mg tablet 1 tablet (30 mg total) by Per G Tube route nightly at bedtime      cholecalciferol, vitamin  D3, 1000 UNITS tablet 2 tablets (2,000 Units total) by Feeding Tube route daily (Patient taking differently: Take 2 tablets (2,000 Units total) by mouth daily)      folic acid (FOLVITE) 1 mg tablet 1 tablet (1 mg total) by Feeding Tube route daily (Patient taking differently: Take 1 tablet (1 mg total) by mouth daily)      HYDROcodone-acetaminophen (NORCO) 5-325 mg tablet 1-2 tablets by Feeding Tube route every 6 (six) hours as needed for Pain 30 tablet 0    hydrOXYzine (ATARAX) 25 mg tablet 1 tablet (25 mg total) by Feeding Tube route every 4 (four) hours as needed for Anxiety      multivitamin with iron-minerals-folic acid (THERA M PLUS) 9 mg iron-400 mcg tablet 1 tablet by Feeding Tube route daily      ondansetron (ZOFRAN) 8 mg tablet 1 tablet (8 mg total) by Feeding Tube route in the morning and 1 tablet (8 mg  total) at noon and 1 tablet (8 mg total) in the evening. (Patient not taking: Reported on 09/09/2022)      prochlorperazine (COMPAZINE) 5 mg tablet 1 tablet (5 mg total) by Feeding Tube route every 8 (eight) hours as needed for Nausea (Patient not taking: Reported on 09/09/2022)      therapeutic multivitamin (THERAGRAN) tablet Take 1 tablet by mouth daily      thiamine mononitrate, vit B1, 100 mg tablet 1 tablet (100 mg total) by Feeding Tube route daily (Patient taking differently: Take 1 tablet (100 mg total) by mouth daily)       No current facility-administered medications for this visit.       ALLERGIES  Allergies/Contraindications   Allergen Reactions    Diphenoxylate-Atropine Abdominal Pain     Severe stomach pain     Caffeine Anxiety     "Jitters"       PHYSICAL EXAM- LIMIT EXAM BY VIDEO  Objective: ECOG1  Constitutional:  Pleasant over video  HENT:  Normocephalic, Neck- Supple.   SKIN: No rash, noted Eyes:  PERRL, EOMI, sclera appear clear  Respiratory:  Chest excursions good Musculoskeletal: Moving all extremities.   Neurologic:  Alert & oriented x 3, nl affect, No focal deficits noted.       RESULTS: Labs in [facility] were reviewed. Lab results reviewed and interpretation provided to patient  Lab Results   Component Value Date    WBC Count 4.3 03/07/2023    Hemoglobin 10.9 (L) 03/07/2023    Hematocrit 34.9 03/07/2023    MCV 93 03/07/2023    Platelet Count 155 03/07/2023     Lab Results   Component Value Date    Creatinine 0.53 (L) 03/07/2023    Creatinine Whole Blood 0.8 06/05/2014         Outside Labs:    No results found.  ASSESSMENT & PLAN  1. Multiple Myeloma: Impression: established - stable - OFF therapy - requiring disease monitoring  This patient is off therapy and at high-risk for progression and requiring intensive monitoring. The patient's last therapy has been DPd - this was toxic in that she had infectious comlications. She now has progression.      We reviewed the labs: SIFE, SPEP, SFLC, CBC =>  Hgb 11.4, m-prot 1, KLC 71.4, LLC 12.9, Hgb 10.9. PETCT 01/2023 - No new hypermetabolic disease.    We discussed that she can consider a low-dose regimen or could continue to wait and watch. The regimen I would recommend is: Ixaz/cyt/medrol as follows    IxazCytMedrol  -  Ixazomib 2 mg Day 1, 8, 15  - Cytoxan 300 mg Day 1, 8, 15  - Medrol 8 mg Day 1, 8, 15    Supportive care:  Acyclovir,   To prevent neuropathy  - Vit B6 50 mg  - Alpha-lipoic acid 200 mg daily  - L-Carnitine 500 mg daily  - Zofran as needed for nausea    Weekly blood tests - CBC, CMV pcr  Every 2 weeks - CMP  Every 4 weeks - Myeloma labs      The patient has a high complexity problem: multiple myeloma, which is an incurable malignancy and the patient is receiving active monitorring, and can have relapse which poses a threat to life and bodily function.    Regarding data complexity, I have reviewed the following tests => SPEP, SIFE, SFLCs, CBC and CMP.      Medical Decision Making/ high-risk  Plan:   - Continue OFF therapy or consider starting Ixaz/Cyt/medrol.  - Continue to follow myeloma protein levels every 4-6 weeks  - Follow-up in 3 months     2. Immunocompromised state/Infectious Disease concerns: Impression: established - immunocompromised from disease/treatment:  The patient is immunocompromised and is at high-risk from infection due to therapy. Has been avoiding Covid exposure. Following guidelines.    Requiring close follow-up for symptoms.  At risk for VZV reactivation and is on ACV. No current signs of infection. Will call with new signs of infection        Plan: - Continue prophylaxis - ACV  - Follow CMV pcr  - yearly flu vaccine  - Needs to shelter from Covid exposure  - The patient will call with any symptoms or signs of infection    3. Depression/Psychologic/Social Stressors: Impression: established - stable  The patient has cancer and has situation depression and is at risk for anxiety. Needs close follow-up    Currently, the attitude  is good. There are no signs of anxiety or active depression. Mood appropriate, understands plan      Plan: No additional medications needed    4. Anemia: Impression: established - stable    The blood counts are stable, Hgb 10.4. Next CBC in 1-2 months (with therapy)      Plan: No transfusions or cytokines needed today.    5. Neutropenia/Thrombocytopenia: Impression: established - stable    The disease and prior treatment has toxicity on the bone marrow - no current signs of suppression    Plan: No cytokines or transfusions    6. RI/ Electrolyte Replacement: Impression: established - stable     Taking ample fluids and avoiding nephrotoxins. Lytes & Cr Okay, appears euvolemic. Appetite good and weight unchanged. No new medications needed.     Plan: Continue adequate fluid intake & balanced, nutritional diet   - Continue as much exercise as possible.  - No additional supplementation needed.    7. Osteopenia: Impression: established - stable:    The patient is at risk for fracture due to MM. We recommend Calcium and Vit D replacement. We also recommend an aggressive exercise program.    Plan: - Consider Biphosphonates    8. Secondary Cancer Risk: impression: established - NEEDS close follow-up  The patient has high-risk of secondary malignancies. Needs close follow-up and routine cancer screening.    Plan: - Derm follow-up yearly  - Colonoscopy every 5 years  - Screening per PCP.

## 2023-02-28 ENCOUNTER — Telehealth: Admit: 2023-02-28 | Discharge: 2023-02-28 | Payer: PRIVATE HEALTH INSURANCE

## 2023-02-28 DIAGNOSIS — B259 Cytomegaloviral disease, unspecified: Secondary | ICD-10-CM

## 2023-02-28 NOTE — Progress Notes (Signed)
Subjective    Ariel Braun is a 62 y.o. 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 Multiple Myeloma now progressing and planning to restart on therapy with imid, proteasome inhibitor, and dex. 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 Hematologist recommended indefinite letermovir ppx. Pt has been off of MM therapy but planned to restart MM therapy soon. Pt is wondering if she should continue on letermovir. Prior hematologist recommended lifelong letermovir ppx as of 04/2022. Pt reports that she would like to stay on letermovir for prevention. Feeling fatigued, otherwise no symptoms.    Initial ID clinic 3/14: recommend d/c letermovir and monitoring CMV levels. Letermovir stopped on 3/15 after oncology visit, transitioned to acyclovir ppx. CMV PCR 3/22 and 4/1 negative.    4/15:  Pt states she stopped letermovir on 3/22. Planned for repeat CMV testing this week, planned for weekly CMVs tests. Planned for further MM therapies starting in 4-6weeks.       Allergies/Contraindications   Allergen Reactions    Diphenoxylate-Atropine Abdominal Pain     Severe stomach pain     Caffeine Anxiety     "Jitters"     Outpatient Encounter Medications as of 02/28/2023   Medication Sig Dispense Refill    acyclovir (ZOVIRAX) 400 mg tablet Take 1 tablet (400 mg total) by mouth in the morning and 1 tablet (400 mg total) in the evening. 180 tablet 3    apixaban (ELIQUIS) 5 mg tablet Take 1 tablet (5 mg total) by mouth 2 (two) times  daily      cholecalciferol, vitamin D3, 1000 UNITS tablet 2 tablets (2,000 Units total) by Feeding Tube route daily (Patient taking differently: Take 2 tablets (2,000 Units total) by mouth daily)      cyanocobalamin, Vitamin B12, (VITAMIN B-12) 1,000 mcg tablet Take 1 tablet (1,000 mcg total) by mouth daily      folic acid (FOLVITE) 1 mg tablet 1 tablet (1 mg total) by Feeding Tube route daily (Patient taking differently: Take 1 tablet (1 mg total) by mouth daily)      furosemide (LASIX) 20 mg tablet 3 tablets (60 mg total) by Feeding Tube route daily Hold for SBP less than 90. (Patient not taking: Reported on 02/24/2023)      HYDROcodone-acetaminophen (NORCO) 5-325 mg tablet 1-2 tablets by Feeding Tube route every 6 (six) hours as needed for Pain 30 tablet 0    hydrOXYzine (ATARAX) 25 mg tablet 1 tablet (25 mg total) by Feeding Tube route every 4 (four) hours as needed for Anxiety      loperamide (IMODIUM A-D) 2 mg tablet Take 2 tablets (4 mg) by feeding tube three times daily. (Patient not taking: Reported on 09/09/2022)      mirtazapine (REMERON) 30 mg tablet 1 tablet (30 mg total) by Per G Tube route nightly at bedtime      multivitamin with iron-minerals-folic acid (THERA M PLUS) 9 mg iron-400 mcg tablet 1 tablet by Feeding Tube route  daily      ondansetron (ZOFRAN) 8 mg tablet 1 tablet (8 mg total) by Feeding Tube route in the morning and 1 tablet (8 mg total) at noon and 1 tablet (8 mg total) in the evening. (Patient not taking: Reported on 09/09/2022)      prochlorperazine (COMPAZINE) 5 mg tablet 1 tablet (5 mg total) by Feeding Tube route every 8 (eight) hours as needed for Nausea (Patient not taking: Reported on 09/09/2022)      rifAXIMin (XIFAXAN) 550 mg tablet Take 1 tablet (550 mg total) by mouth 2 (two) times daily 180 tablet 0    simethicone (MYLICON) 40 mg/0.6 mL drops 1.2 mL (80 mg total) by Feeding Tube route in the morning and 1.2 mL (80 mg total) at noon and 1.2 mL (80 mg total) in the evening and  1.2 mL (80 mg total) before bedtime. (Patient not taking: Reported on 09/09/2022)      simethicone (MYLICON) 40 mg/0.6 mL drops Take 2.4 mL (160 mg total) by mouth in the morning and 2.4 mL (160 mg total) at noon and 2.4 mL (160 mg total) in the evening and 2.4 mL (160 mg total) before bedtime. (Patient not taking: Reported on 09/09/2022)      spironolactone (ALDACTONE) 50 mg tablet 3 tablets (150 mg total) by Feeding Tube route daily Hold for SBP less than 90.      therapeutic multivitamin (THERAGRAN) tablet Take 1 tablet by mouth daily      thiamine mononitrate, vit B1, 100 mg tablet 1 tablet (100 mg total) by Feeding Tube route daily (Patient taking differently: Take 1 tablet (100 mg total) by mouth daily)      [DISCONTINUED] lansoprazole (PREVACID) 3 mg/mL suspension 10 mL (30 mg total) by Feeding Tube route every morning before breakfast      [DISCONTINUED] letermovir (PREVYMIS) tablet 1 tablet (480 mg total) by Feeding Tube route daily 1 tablet (480 mg) by feeding tube daily 90 tablet 3    [DISCONTINUED] ursodiol (ACTIGALL) 60 mg/mL syrup 10 mL (600 mg total) by Feeding Tube route 2 (two) times daily       No facility-administered encounter medications on file as of 02/28/2023.     Past Medical History:   Diagnosis Date    Acid reflux disease     Anxiety     Depression     Hereditary angioedema (CMS code)     Multiple myeloma, without mention of having achieved remission     IgG kappa    Myeloma (CMS code) 05/10/2011       Past Surgical History:   Procedure Laterality Date    AUTOLOGOUS STEM CELL TRANSPLANTATION  05/12/11    BREAST CYST EXCISION  1981     Family History   Problem Relation Name Age of Onset    Diabetes Mother      Hypertension Mother      Colon cancer Father      Hypertension Brother ##Brother1        Social History     Tobacco Use    Smoking status: Unknown   Substance and Sexual Activity    Alcohol use: Never    Drug use: Never    Sexual activity: Not Currently           Objective           Physical Exam  Video visit  Appears well, no distress       Review of Prior Testing    CBC  01/26/23  1006 12/29/22  0820 12/03/22  0903   WBC 5.0 4.8 6.4   HGB 11.4 10.1* 12.0   HCT 34.6 32.5* 37.8   PLT 154 154 200   NEUTA 2.4 2.6 3.8   LYMA 1.9 1.5 1.9   MOA 0.5 0.5 0.6   EOA 0.1 0.1 0.1   BASOA 0.0 0.0 0.0       Chem7        01/26/23  1006 12/29/22  0820 12/03/22  0903   NA 145* 137 140   K 3.8 3.5 3.8   CL 107* 109* 105   CO2 BUN CREAT 0.65 0.63 0.78   GLU 82 86 90       Liver Panel        01/26/23  1006 12/29/22  0820 12/03/22  0903 05/07/22  0609 05/06/22  0542 05/05/22  0548 05/04/22  0538   AST < > 53* 49* 59*   ALT < > ALKP 120 114 133*   < > 220* 211* 244*   TBILI 0.7 0.6 0.7   < > 0.7 0.7 0.7   TP 7.0  6.9 6.6  6.6 6.9   < >  --   --   --    ALB 4.2 4.0 4.2   < >  --   --   --    GGT  --   --   --   --  93* 90* 111*    < > = values in this interval not displayed.       CRP  No results found in last 365 days    ESR  No results found in last 365 days      Microbiology  05/25/22 CMV PCR detected <30  12/13/21 CMV PCR 443K  3/22/4 CMV PCR neg  02/14/23 CMV PCR neg    Radiology Results      Antimicrobial History:  Letermovir 04/2022 - 02/04/23       Assessment and Plan       JAINE ESTABROOKS is a 62 y.o. 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 Colon bx with CMV +, liver bx neg for CMV, CSF CMV PCR neg. Pt treated w ganciclovir --> valganciclovir 12/2021, then transitioned to indefinite letermovir, which has now been discontinued as of last ID clinic visit 01/2023. Lifelong indefinite CMV ppx is not a typical approach, given the risks of development of resistance especially as letermovir has low barrier to  resistance already. Although not a typical approach, can check CMV PCRs weekly after starting on chemotherapy for several weeks, and f not rising, then can likely stop checking weekly CMV PCRs after a period of time.   --Monitor with weekly CMV PCRs after pt starts chemotherapy. If no concerns for CMV viremia after several weeks, can then likely stop preemptive monitoring. Continue off of letermovir ppx at this time  --HSV/VZV ppx per heme/onc protocols     I reviewed external records from 2 providers outside my specialty or healthcare organization as summarized in the note.    I, Cassell Clement, MD, spent a total  of 48 minutes on this patient's care on the day of their visit excluding time spent related to any billed procedures. This time includes time spent with the patient as well as time spent documenting in the medical record, reviewing patient's records and tests, obtaining history, placing orders, communicating with other healthcare professionals, counseling the patient, family, or caregiver, and/or care coordination for the diagnoses above.      I performed this evaluation using real-time telehealth tools, including a live video Zoom connection between my location and the patient's location. Prior to initiating, the patient consented to perform this evaluation using telehealth tools.

## 2023-02-28 NOTE — Patient Instructions (Signed)
Weekly CMV PCRs as planned    We would start treatment only if the CMV quantitative value rises above 1000 or higher. Lower than 1000 does not typically need treatment.    Monitor for fevers and diarrhea    Lurene Shadow, MD  Infectious Diseases

## 2023-03-01 ENCOUNTER — Telehealth: Admit: 2023-03-02 | Payer: PRIVATE HEALTH INSURANCE | Attending: Transplant Hepatology

## 2023-03-01 DIAGNOSIS — K7460 Unspecified cirrhosis of liver: Secondary | ICD-10-CM

## 2023-03-01 NOTE — Progress Notes (Signed)
Ariel Braun is a 62 y.o. female seen for an initial consultation at the request of Dr. Modesto Charon for chronic liver disease restarting myeloma therapy.    I performed this evaluation using real-time telehealth tools, including a live video Zoom connection between my location and the patient's location. Prior to initiating, the patient consented to perform this evaluation using telehealth tools.    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 In 05/2022 during a hospitalization she had ongoing ascites requiring weekly LVP in the setting of protein caloric malnutrition s/p PEG-dependent on tube feeds. Hepatology was consulted for further management of her ascites, which was believed to be multifactorial but more driven by her malnutrition than her portal hypertension.     To review, history is notable for severe diarrhea secondary to norovirus treated with nitazoxanide x 2) complicated by hypovolemic shock and acute liver injury in 12/02/2021, transferred to [facility] with prolonged hospital course (1/25-01/15/2022). Her transjugular liver biopsy on 12/11/21 showed mild HVPG of 8 mmHg and steatohepatitis with pericentral/sinusoidal and periportal fibrosis, along with ductal reaction with focal pericholangitis, suggestive of drug-induced liver injury in the setting of underlying NASH with possible culprit including thalidomide derivatives (pomalidomide) and dexamethasone (which can exacerbation of pre-existing NASH). Flexible sigmoidoscopy on 12/17/2021 showed CMV colitis and was treated with  ganciclovir followed by letemovir due to bone marrow suppression. 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 Her ascites fluid studies was notable for high SAAG >1.1 and low protein <1.5 suggestive of portal hypertension (mild elevated HVPG of 8 mmHg [WHVP 16 mmHg - FHVP 8 mmHg]on 12/11/21). She has no history of SBP. Her imaging with CTAP on 12/14/21 showed marked steatosis along with US doppler on 12/11/21 showing steatosis with portal vein diameter of 15 mm. US abdomen on 12/30/21 showed steatosis with normal surface contour. [place] with doppler on 05/16/22 showed mild liver surface nodularity compatible with early cirrhosis with sequela of portal hypertension including moderate ascites and splenomegaly.  She was discharged to a SNF for an extended period of time.    She has been off tube feeds since 06/2022 and has been at home since 09/2022, no longer needing home services. Her diarrhea has completely resolved. Her ascites and edema have resolved. She stopped rifaximin and ursodiol earlier in April. She recently saw Dr. Daphine Deutscher. Plan to restart myeloma therapy with IxazCytMedrol:  - Ixazomib 2 mg Day 1, 8, 15  - Cytoxan 300 mg Day 1, 8, 15  - Medrol 8 mg Day 1, 8, 15    Past Medical History:   Diagnosis Date    Acid reflux disease     Anxiety     Depression     Hereditary angioedema (CMS code)     Multiple myeloma, without mention of having achieved remission     IgG kappa    Myeloma (CMS code) 05/10/2011       Past Surgical History:   Procedure Laterality Date  AUTOLOGOUS STEM CELL TRANSPLANTATION  05/12/11    BREAST CYST EXCISION  1981       Current Outpatient Medications   Medication Sig Dispense Refill    apixaban (ELIQUIS) 5 mg tablet Take 1 tablet (5 mg total) by mouth 2 (two) times daily       cholecalciferol, vitamin D3, 1000 UNITS tablet 2 tablets (2,000 Units total) by Feeding Tube route daily (Patient taking differently: Take 2 tablets (2,000 Units total) by mouth daily)      cyanocobalamin, Vitamin B12, (VITAMIN B-12) 1,000 mcg tablet Take 1 tablet (1,000 mcg total) by mouth daily      mirtazapine (REMERON) 30 mg tablet 1 tablet (30 mg total) by Per G Tube route nightly at bedtime      multivitamin with iron-minerals-folic acid (THERA M PLUS) 9 mg iron-400 mcg tablet 1 tablet by Feeding Tube route daily      therapeutic multivitamin (THERAGRAN) tablet Take 1 tablet by mouth daily      thiamine mononitrate, vit B1, 100 mg tablet 1 tablet (100 mg total) by Feeding Tube route daily (Patient taking differently: Take 1 tablet (100 mg total) by mouth daily)      acyclovir (ZOVIRAX) 400 mg tablet Take 1 tablet (400 mg total) by mouth in the morning and 1 tablet (400 mg total) in the evening. 180 tablet 3    folic acid (FOLVITE) 1 mg tablet 1 tablet (1 mg total) by Feeding Tube route daily (Patient taking differently: Take 1 tablet (1 mg total) by mouth daily)      HYDROcodone-acetaminophen (NORCO) 5-325 mg tablet 1-2 tablets by Feeding Tube route every 6 (six) hours as needed for Pain 30 tablet 0    hydrOXYzine (ATARAX) 25 mg tablet 1 tablet (25 mg total) by Feeding Tube route every 4 (four) hours as needed for Anxiety      ondansetron (ZOFRAN) 8 mg tablet 1 tablet (8 mg total) by Feeding Tube route in the morning and 1 tablet (8 mg total) at noon and 1 tablet (8 mg total) in the evening. (Patient not taking: Reported on 09/09/2022)      prochlorperazine (COMPAZINE) 5 mg tablet 1 tablet (5 mg total) by Feeding Tube route every 8 (eight) hours as needed for Nausea (Patient not taking: Reported on 09/09/2022)       No current facility-administered medications for this visit.       Allergies: She is allergic to diphenoxylate-atropine and caffeine.    Social History:   Social History     Socioeconomic History     Marital status: Married     Spouse name: Not on file    Number of children: Not on file    Years of education: Not on file    Highest education level: Not on file   Occupational History    Not on file   Tobacco Use    Smoking status: Unknown    Smokeless tobacco: Not on file   Substance and Sexual Activity    Alcohol use: Never    Drug use: Never    Sexual activity: Not Currently   Other Topics Concern    Not on file   Social History Narrative    Not on file     Social Determinants of Health     Financial Resource Strain: Low Risk  (02/28/2023)    Overall Financial Resource Strain (CARDIA)     Difficulty of Paying Living Expenses: Not hard at all   Food Insecurity: No  Food Insecurity (02/28/2023)    Hunger Vital Sign     Worried About Running Out of Food in the Last Year: Never true     Ran Out of Food in the Last Year: Never true   Transportation Needs: No Transportation Needs (02/28/2023)    PRAPARE - Therapist, art (Medical): No     Lack of Transportation (Non-Medical): No   Housing Stability: Low Risk  (02/28/2023)    Housing Stability Vital Sign     Unable to Pay for Housing in the Last Year: No     Number of Places Lived in the Last Year: 1     Unstable Housing in the Last Year: No       Family History: Her family history includes Colon cancer in her father; Diabetes in her mother; Hypertension in her brother and mother.  Family history is otherwise negative or as noted above.     Physical Exam:  Vital Signs: There were no vitals taken for this visit.  Constitutional: Patient appears well-developed and well-nourished. Pleasant and appropriately interactive.   Eyes: Anicteric sclera  Neck: Normal range of motion.   Pulmonary/Chest: Effort normal. No respiratory distress. No cough.  Abdomen: No abdominal distension.   Neurological: Alert and oriented to person, place, and time. Able to stand from sitting and walk. No asterixis.  Psychiatric: Normal mood and affect. Behavior is normal.  Judgment and thought content normal.   Skin: No rash.     Records Review: I have personally reviewed the patient's medical records. These are summarized within the history of present illness.    Labs: I have personally reviewed and interpreted the following laboratory studies:    CIRRHOSIS MANAGEMENT          Latest Ref Rng & Units 05/22/2022    05:47 05/23/2022    05:47 05/24/2022    05:37 05/25/2022    06:06 05/26/2022    05:48 05/27/2022    05:41 11/03/2022    10:39 12/03/2022    09:03   Labs   WBC Count 3.4 - 10.8 x10E3/uL 5.7  6.1  5.7  5.0  5.7  5.0  4.0  6.4    RBC Count 3.77 - 5.28 x10E6/uL 2.24  2.68  2.52  2.70  2.65  2.79  3.31  4.07    Hemoglobin 11.1 - 15.9 g/dL 7.1  8.8  7.9  8.3  8.7  8.5  10.0  12.0    Hematocrit 34.0 - 46.6 % 23.4  27.8  25.8  26.8  26.7  28.3  31.5  37.8    MCV 79 - 97 fL 105  104  102  99  101  101  95  93    MCH 26.6 - 33.0 pg 31.7  32.8  31.3  30.7  32.8  30.5  30.2  29.5    MCHC 31.5 - 35.7 g/dL 16.1  09.6  04.5  40.9  32.6  30.0  31.7  31.7    Platelet Count 150 - 450 x10E3/uL 89  150  95  137  136  130  196  200    PT 11.6 - 15.0 s   15.1         INR 0.9 - 1.2   1.2         Sodium 134 - 144 mmol/L 131  134  132  131  131  132  141  140    Creatinine 0.57 -  1.00 mg/dL 1.47  8.29  5.62  1.30  0.62  0.57  0.60  0.78    Bilirubin, Total 0.0 - 1.2 mg/dL 0.7  0.4  0.4  0.4  0.4  0.4  0.4  0.7    Albumin, Serum / Plasma 3.9 - 4.9 g/dL   1.8    2.0  3.5  4.2    MELD-Na PELD    8 8 8       MELD 3.0    16 17 17             12/29/2022    08:20 01/26/2023    10:06   Labs   WBC Count 4.8  5.0    RBC Count 3.42  3.78    Hemoglobin 10.1  11.4    Hematocrit 32.5  34.6    MCV 95  92    MCH 29.5  30.2    MCHC 31.1  32.9    Platelet Count 154  154    PT     INR     Sodium 137  145    Creatinine 0.63  0.65    Bilirubin, Total 0.6  0.7    Albumin, Serum / Plasma 4.0  4.2    MELD-Na PELD     MELD 3.0       .AMB SYNOPSIS NAFLD MANAGEMENT          Latest Ref Rng & Units 05/22/2022    05:47 05/23/2022    05:47  05/24/2022    05:37 05/25/2022    06:06 05/26/2022    05:48 05/27/2022    05:41 11/03/2022    10:39 12/03/2022    09:03   Labs   WBC Count 3.4 - 10.8 x10E3/uL 5.7  6.1  5.7  5.0  5.7  5.0  4.0  6.4    RBC Count 3.77 - 5.28 x10E6/uL 2.24  2.68  2.52  2.70  2.65  2.79  3.31  4.07    Hemoglobin 11.1 - 15.9 g/dL 7.1  8.8  7.9  8.3  8.7  8.5  10.0  12.0    Hematocrit 34.0 - 46.6 % 23.4  27.8  25.8  26.8  26.7  28.3  31.5  37.8    MCV 79 - 97 fL 105  104  102  99  101  101  95  93    MCH 26.6 - 33.0 pg 31.7  32.8  31.3  30.7  32.8  30.5  30.2  29.5    MCHC 31.5 - 35.7 g/dL 86.5  78.4  69.6  29.5  32.6  30.0  31.7  31.7    Platelet Count 150 - 450 x10E3/uL 89  150  95  137  136  130  196  200    PT 11.6 - 15.0 s   15.1         INR 0.9 - 1.2   1.2         Creatinine 0.57 - 1.00 mg/dL 2.84  1.32  4.40  1.02  0.62  0.57  0.60  0.78    AST 0 - 40 IU/L 49  49  62  48  49  50  24  23    ALT 0 - 32 IU/L 19  20  21  19  21  20  12  13     Bilirubin, Total 0.0 - 1.2 mg/dL 0.7  0.4  0.4  0.4  0.4  0.4  0.4  0.7  Alkaline Phosphatase 44 - 121 IU/L 182  182  183  176  200  179  121  133    Albumin, Serum / Plasma 3.9 - 4.9 g/dL   1.8    2.0  3.5  4.2          12/29/2022    08:20 01/26/2023    10:06   Labs   WBC Count 4.8  5.0    RBC Count 3.42  3.78    Hemoglobin 10.1  11.4    Hematocrit 32.5  34.6    MCV 95  92    MCH 29.5  30.2    MCHC 31.1  32.9    Platelet Count 154  154    PT     INR     Creatinine 0.63  0.65    AST 20  23    ALT 11  14    Bilirubin, Total 0.6  0.7    Alkaline Phosphatase 114  120    Albumin, Serum / Plasma 4.0  4.2      Serologies:  Hep A IgG reactive (12/11/2021)  HBsAg negative (12/11/2021)  Anti-HBs positive (12/11/2021)  Anti-HBc negative (12/11/2021)  HIV negative (12/15/2021)  HCV Ab negative (02/12/2011), negative (12/11/2021)    Studies: I have reviewed the following studies:    PET CT 01/18/2023: Impression:  -Stable appearance of mixed lytic and sclerotic lesions within the thoracolumbar spine and pelvis. No evidence  for interval hypermetabolic skeletal pathology  -Stable nodular thyroid with interval decrease in metabolic activity  -Inflammatory changes within the lower lobes of both lungs     05/16/2022: IMPRESSION:   1.  Mild liver surface nodularity compatible with early cirrhosis and/or fibrosis. Patent vasculature with normal direction of flow.  2.  Sequela of portal hypertension including increased now moderate ascites and splenomegaly.  3.  Cholelithiasis.    Liver biopsy 12/11/2021: FINAL PATHOLOGIC DIAGNOSIS   Liver, transjugular biopsy:  1.  Steatohepatitis with pericentral/sinusoidal and periportal fibrosis; see comment.  2.  Ductular reaction with focal pericholangitis; see comment.  COMMENT:  The biopsy shows severe steatosis with pericentral/sinusoidal and  periportal fibrosis, histiocytic and neutrophilic lobular inflammation,  Mallory hyaline as well as rare ballooned hepatocytes. 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  Clinical correlation with steatohepatitis risk factors is suggested,  including for any history of massive weight loss which may lead to an  aggressive onset of steatohepatitis [1].  The history of dexamethasone  use is noted; corticosteroid use has been associated with exacerbation  of pre-existing fatty liver disease [2].     Additionally, there is ductular reaction with focal pericholangitis,  raising consideration for a duct obstructive process. Clinical  correlation is advised. The concern for other drug-induced liver injury  is noted. In particular, thalidomide derivatives (such as pomalidomide)  have been reported in association with vanishing bile duct syndrome  (VBDS) [3]; however, there is no convincing duct loss to suggest VBDS.  There are no clusters of plasma cells seen to suggest involvement by  myeloma.    Microscopic findings  Adequacy: 9 portal tracts.   Portal tracts: Mild portal inflammation with histiocytes and lymphocytes, no  interface activity, mild ductular reaction with focal pericholangitis. Interlobular bile ducts show mild duct injury without  overt duct loss.   Hepatic parenchyma: Mild lobular inflammation with histiocytes and neutrophils, rare spotty hepatocellular injury/necrosis, severe steatosis with neutrophilic satellitosis (grade 3, scale 0-3), rare  hepatocellular ballooning,  extensive Mallory hyaline deposition, rare cholestasis, no granulomas, no viropathic changes, and no sinusoidal changes.    Immunohistochemical and histochemical stains (block A1):  - Trichrome:      Highlights mild to moderate pericellular/sinusoidal fibrosis and periportal fibrosis (stage 2, scale 0-4,NASH-CRN method).  - Iron:  1+ focal staining in scattered Kupffer cells.  - PASD:  No cytoplasmic globules typical of alpha-1-antitrypsin deficiency.  - CK8/18: Highlights Mallory hyaline within hepatocytes.  - CK7: Highlights mild ductular reaction and intact interlobular bile ducts. No periportal hepatocyte staining to support chronic cholestatic effect.  - CD68: Highlights scattered portal and lobular macrophages. No staining in hepatocytes with Mallory hyaline (indicates that these cells are not macrophages).  - [place] Red:     Negative, no support for hepatic amyloidosis.     ASSESSMENT AND PLAN:  Ariel Braun is a 62 y.o. 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    Chronic liver disease: Stage 2 fibrosis with Steatohepatitis and Ductular reaction with focal pericholangitis on liver  biopsy in 11/2021. Her liver enzymes have normalized and ascites resolved with improvement in nutrition/albumin. Agree with holding the rifaximin and the ursodiol.     Multiple myeloma: Planning to start Ixazomib, Cytoxan, Medrol. Reviewed in [facility] liver tox:  Ixazomib: In large clinical trials of ixazomib combined with lenalidomide and dexamethasone, elevations in serum aminotransferase levels were common, occurring in ~10% of patients. However, values greater than 5 times the upper limit of normal (ULN) were rare, occurring in <1% of recipients. Cases of clinically apparent liver injury including acute liver failure have been reported in patients receiving ixazomib, however in many instances multiple concomitant medications were being taken and the specific role of ixazomib in causing the liver injury was not always clear. The clinical features of cases of liver injury attributed to ixazomib have not been defined in any detail. Hepatotoxicity is listed as a warning in the product label for ixazomib and monitoring of serum enzymes during treatment is recommended.  Cyclophosphamide: Mild and transient elevations in serum aminotransferase levels are found in up to 43% of patients with cancer who are treated with cyclophosphamide. The abnormalities are generally asymptomatic and transient and do not require dose modification. Enzyme elevations are more common with higher doses and with intravenous therapy. In some instances, marked elevations arise warranting dose modification or discontinuation of cyclophosphamide. Clinically apparent liver injury from standard doses of cyclophosphamide is uncommon, but several case reports of acute liver injury with jaundice have been published. The onset is within 2 to 8 weeks of starting cyclophosphamide and the pattern of serum enzyme elevations is hepatocellular. Immunoallergic and autoimmune features are uncommon. The injury in most cases is self-limited and resolves within 1 to 3  months of stopping; however, fatal instances have been reported. Recurrence on reexposure has been described.  Medrol: Corticosteroid therapy can cause hepatic steatosis and hepatic enlargement, but this is often not clinically apparent, particularly in adults. This effect can occur quite rapidly and is rapidly reversed with discontinuation. High doses and long term use has been associated with the development or exacerbation of nonalcoholic  steatohepatitis with elevations in serum aminotransferase levels and liver histology resembling alcoholic hepatitis with steatosis, chronic inflammation, centrolobular ballooning degeneration and Mallory bodies. However, symptomatic or progressive liver injury from corticosteroid induced steatohepatitis is uncommon. Furthermore, corticosteroids may act to worsen an underlying nonalcoholic fatty liver disease rather than causing the condition de novo. The worsening may be due to direct effects of glucocorticoids on insulin resistance or fatty acid metabolism or may be the result of weight gain which is common with long term corticosteroid therapy. While simple steatosis induced by corticosteroids is rapidly reversible, steatohepatitis can be slow to resolve upon withdrawal of corticosteroids.    Recommend monitoring liver tests while on treatment- ALT, AST, t bili, alk phos, GGT, INR.    Surveillance for hepatocellular carcinoma: Liver biopsy in 11/2021 showed F2 fibrosis, but imaging in July 2023 concerning for advanced fibrosis (complicated by multifactorial ascites). Discussed with patient pros/cons of monitoring for [facility]- decided to proceed with q6 month surveillance. I have ordered an AFP with next labs and will send her requisition for abdominal U/S.  r    Immunity: She is hepatitis A and B immune. Anti-HBc negative.    Follow-up: 3 months    An After Visit Summary was printed and given to the patient. A copy of this consultation letter was faxed/routed to Dr. Modesto Charon.       I  spent a total of 60 minutes on this patient's care on the day of their visit excluding time spent related to any billed procedures. This time includes time spent with the patient as well as time spent documenting in the medical record, reviewing patient's records and tests, obtaining history, placing orders, communicating with other healthcare professionals, counseling the patient, family, or caregiver, and/or care coordination for the diagnoses above.

## 2023-03-08 LAB — COMPLETE BLOOD COUNT WITH DIFFERENTIAL
% Basophils: 1 %
% Eosinophils: 3 %
% Immature Granulocytes: 2 %
% Lymphocytes: 37 %
% Monocytes: 12 %
% Neutrophils: 45 %
Abs Basophils: 0 10*3/uL (ref 0.0–0.2)
Abs Eosinophils: 0.1 10*3/uL (ref 0.0–0.4)
Abs Lymphocytes: 1.6 10*3/uL (ref 0.7–3.1)
Abs Monocytes: 0.5 10*3/uL (ref 0.1–0.9)
Abs Neutrophils: 2 10*3/uL (ref 1.4–7.0)
Hematocrit: 34.9 % (ref 34.0–46.6)
Hemoglobin: 10.9 g/dL — ABNORMAL LOW (ref 11.1–15.9)
MCH: 29 pg (ref 26.6–33.0)
MCHC: 31.2 g/dL — ABNORMAL LOW (ref 31.5–35.7)
MCV: 93 fL (ref 79–97)
Platelet Count: 155 10*3/uL (ref 150–450)
RBC Count: 3.76 x10E6/uL — ABNORMAL LOW (ref 3.77–5.28)
RDW-CV: 14.4 % (ref 11.7–15.4)
WBC Count: 4.3 10*3/uL (ref 3.4–10.8)

## 2023-03-08 LAB — COMPREHENSIVE METABOLIC PANEL (BMP, AST, ALT, T.BILI, ALKP, TP ALB)
AST: 18 IU/L (ref 0–40)
AST: 20 IU/L (ref 0–40)
Alanine transaminase: 11 IU/L (ref 0–32)
Alanine transaminase: 14 IU/L (ref 0–32)
Albumin, Serum / Plasma: 4 g/dL (ref 3.9–4.9)
Albumin, Serum / Plasma: 4 g/dL (ref 3.9–4.9)
Albumin/Globulin Ratio: 1.4 (ref 1.2–2.2)
Albumin/Globulin Ratio: 1.4 (ref 1.2–2.2)
Alkaline Phosphatase: 125 IU/L — ABNORMAL HIGH (ref 44–121)
Alkaline Phosphatase: 126 IU/L — ABNORMAL HIGH (ref 44–121)
BUN/Creatinine Ratio (External Lab): 25 (ref 12–28)
BUN/Creatinine Ratio (External Lab): 25 (ref 12–28)
Bilirubin, Total: 0.4 mg/dL (ref 0.0–1.2)
Bilirubin, Total: 0.5 mg/dL (ref 0.0–1.2)
Calcium, total, Serum / Plasma: 9.5 mg/dL (ref 8.7–10.3)
Calcium, total, Serum / Plasma: 9.7 mg/dL (ref 8.7–10.3)
Carbon Dioxide, Total: 22 mmol/L (ref 20–29)
Carbon Dioxide, Total: 22 mmol/L (ref 20–29)
Chloride, Serum / Plasma: 109 mmol/L — ABNORMAL HIGH (ref 96–106)
Chloride, Serum / Plasma: 109 mmol/L — ABNORMAL HIGH (ref 96–106)
Creatinine: 0.53 mg/dL — ABNORMAL LOW (ref 0.57–1.00)
Creatinine: 0.57 mg/dL (ref 0.57–1.00)
Globulin, Total: 2.8 g/dL (ref 1.5–4.5)
Globulin, Total: 2.9 g/dL (ref 1.5–4.5)
Glucose, non-fasting: 87 mg/dL (ref 70–99)
Glucose, non-fasting: 89 mg/dL (ref 70–99)
Potassium, Serum / Plasma: 3.7 mmol/L (ref 3.5–5.2)
Potassium, Serum / Plasma: 3.8 mmol/L (ref 3.5–5.2)
Protein, Total, Serum / Plasma: 6.8 g/dL (ref 6.0–8.5)
Protein, Total, Serum / Plasma: 6.9 g/dL (ref 6.0–8.5)
Sodium, Serum / Plasma: 141 mmol/L (ref 134–144)
Sodium, Serum / Plasma: 142 mmol/L (ref 134–144)
Urea Nitrogen, serum/plasma: 13 mg/dL (ref 8–27)
Urea Nitrogen, serum/plasma: 14 mg/dL (ref 8–27)
eGFRcr: 102 mL/min/{1.73_m2} (ref >59)
eGFRcr: 104 mL/min/{1.73_m2} (ref >59)

## 2023-03-08 LAB — PROTHROMBIN TIME
INR: 1 (ref 0.9–1.2)
PT: 10.7 s (ref 9.1–12.0)

## 2023-03-08 LAB — LACTATE DEHYDROGENASE, BLOOD: LDH, serum/plasma: 187 IU/L (ref 119–226)

## 2023-03-08 LAB — GAMMA-GLUTAMYL TRANSPEPTIDASE: Gamma-glutamyl Transpeptidase: 15 IU/L (ref 0–60)

## 2023-03-08 LAB — ALPHA-FETOPROTEIN, SERUM: AFP, Serum, Tumor Marker: <1.8 ng/mL (ref 0.0–9.2)

## 2023-03-09 LAB — KAPPA AND LAMBDA FREE LIGHT CHAINS, SERUM
Kappa Light Chain, Serum, Free: 71.4 mg/L — ABNORMAL HIGH (ref 3.3–19.4)
Kappa/Lambda Ratio, Serum, Free: 5.53 — ABNORMAL HIGH (ref 0.26–1.65)
Lambda Light Chain, Serum, Free: 12.9 mg/L (ref 5.7–26.3)

## 2023-03-09 LAB — PROTEIN ELECTROPHORESIS, SERUM
A/G Ratio: 0.9 (ref 0.7–1.7)
Albumin, sPEP: 3.2 g/dL (ref 2.9–4.4)
Alpha 1 Globulin: 0.3 g/dL (ref 0.0–0.4)
Alpha 2 Globulin: 0.8 g/dL (ref 0.4–1.0)
Beta Globulin: 1 g/dL (ref 0.7–1.3)
Gamma Globulin: 1.6 g/dL (ref 0.4–1.8)
Globulin, Total: 3.7 g/dL (ref 2.2–3.9)
M-Protein (monoclonal), Serum: 1 g/dL — ABNORMAL HIGH
Protein, Total, Serum / Plasma: 6.9 g/dL (ref 6.0–8.5)

## 2023-03-09 LAB — IMMUNOFIXATION ELECTROPHORESIS, SERUM
IgA, Serum: 45 mg/dL — ABNORMAL LOW (ref 87–352)
IgG: 1597 mg/dL (ref 586–1602)
IgM, Serum: 65 mg/dL (ref 26–217)

## 2023-03-10 LAB — CYTOMEGALOVIRUS DNA, QUANTITATIVE PCR, PLASMA: Cytomegalovirus DNA, Quantitative PCR: NEGATIVE IU/mL

## 2023-03-25 ENCOUNTER — Telehealth: Admit: 2023-03-26 | Payer: PRIVATE HEALTH INSURANCE | Attending: Nurse Practitioner

## 2023-03-25 DIAGNOSIS — C9000 Multiple myeloma not having achieved remission: Secondary | ICD-10-CM

## 2023-03-25 NOTE — Patient Instructions (Addendum)
To prevent neuropathy:  - Vit B6 50 mg  - Alpha-lipoic acid 200 mg daily  - L-Carnitine 500 mg daily    I will discuss Pom and dex option with dr. Daphine Deutscher    Follow up with Harriett Sine in 1 month, dr Daphine Deutscher in 2 months

## 2023-03-25 NOTE — Progress Notes (Signed)
Ariel Braun  [id number]    Date of Service: 03/25/2023    Reason for visit / Chief Complaints:    Subjective   Ariel Braun is a 62 y.o. female with MM who is on VIDEO follow-up    I performed this consultation using real-time Telehealth tools, including a live video connection between my location and the patient's location. Prior to initiating the consultation, I obtained informed verbal consent to perform this consultation using Telehealth tools and answered all the questions about the Telehealth interaction. Patient in [place].    Events since the last visit: The patient has been OFF therapy. She had a prolonged hospitalization in early 2023 for ~ 5 months (CMV colitis, norovirus infection (chronic), FTT). Then readmitted ~6/8-7/13/23 - and admitted to SNF for an extended period of time after discharge. Off feeding tube since 06/2022, taking all pills orally since 07/2022. Has been home since Nov 2023.     Overall doing better. Feeling stronger, working on going up and down stairs.  Legs better but not at 100%, no falls. No fevers or infectious symptoms. Appetite OK.  No new bone pain or signs of progressive MM. No kidney/urinary difficuties. No f/chills, URI symptoms, sinus congestion, sore throat or other infectious symptoms. No N/V/dysuria/diarrhea.  No CP, palp.  No bleeding or bruising.    Has been taking care of her son who is ill.    Oncology history:  DX:   IgG kappa myeloma, diagnosed 09/07/10   - BMBx: 8% plasma cells, normal cyto 46XX, FISH showed only 3 copies 1q21, Stage 2 ISS (3.88 beta 2 microglobulin),IgG 4750, M-spike 3.2, kappa 586.9 mg/L.     TREATMENT  1. LINE1: RVD 10/2010 through April 2011 (? 4 cycles). Achieved VGPR with M-spike 0.4, IgG 886, kappa 27.6.   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 M-spike 0.6, IgG 1270.     3. LINE2: 08/05/14: Started Rev 15 mg 3/4. M-spike 1, IgG 1640, kappa 62.2 - SD    Biochemical PD  04/17/19: M-1.4, KLC 96.3. ? PD. Up Rev to 15 mg qd (3/4) and dex 8 mg wkly(4/4).       07/20/19: L hip pain. M-1.2, KLC 96. Cont. Rev 15 mg (3/4)/dex 8 mg (4/4).     4. LINE4: 12/2019: DaraPomDex.       Past Medical History:   Diagnosis Date    Acid reflux disease     Anxiety     Depression     Hereditary angioedema (CMS code)     Multiple myeloma, without mention of having achieved remission     IgG kappa    Myeloma (CMS code) 05/10/2011     Past medical, family and social histories, as well as medications and allergies, were reviewed and updated in the medical record as appropriate.    Current Outpatient Medications   Medication Sig Dispense Refill    acyclovir (ZOVIRAX) 400 mg tablet Take 1 tablet (400 mg total) by mouth in the morning and 1 tablet (400 mg total) in the evening. 180 tablet 3    apixaban (ELIQUIS) 5 mg tablet Take 1 tablet (5 mg total) by mouth 2 (two) times daily      cholecalciferol, vitamin D3, 1000 UNITS tablet 2 tablets (2,000 Units total) by Feeding Tube route daily (Patient taking differently: Take 2 tablets (2,000 Units total) by mouth daily)      cyanocobalamin, Vitamin B12, (  VITAMIN B-12) 1,000 mcg tablet Take 1 tablet (1,000 mcg total) by mouth daily      folic acid (FOLVITE) 1 mg tablet 1 tablet (1 mg total) by Feeding Tube route daily (Patient taking differently: Take 1 tablet (1 mg total) by mouth daily)      HYDROcodone-acetaminophen (NORCO) 5-325 mg tablet 1-2 tablets by Feeding Tube route every 6 (six) hours as needed for Pain 30 tablet 0    hydrOXYzine (ATARAX) 25 mg tablet 1 tablet (25 mg total) by Feeding Tube route every 4 (four) hours as needed for Anxiety      mirtazapine (REMERON) 30 mg tablet 1 tablet (30 mg total) by Per G Tube route nightly at bedtime      multivitamin with iron-minerals-folic acid (THERA M PLUS) 9 mg iron-400 mcg tablet 1 tablet by Feeding Tube route daily      ondansetron (ZOFRAN) 8 mg tablet 1 tablet (8 mg total) by Feeding Tube route in the morning and 1  tablet (8 mg total) at noon and 1 tablet (8 mg total) in the evening. (Patient not taking: Reported on 09/09/2022)      prochlorperazine (COMPAZINE) 5 mg tablet 1 tablet (5 mg total) by Feeding Tube route every 8 (eight) hours as needed for Nausea (Patient not taking: Reported on 09/09/2022)      therapeutic multivitamin (THERAGRAN) tablet Take 1 tablet by mouth daily      thiamine mononitrate, vit B1, 100 mg tablet 1 tablet (100 mg total) by Feeding Tube route daily (Patient taking differently: Take 1 tablet (100 mg total) by mouth daily)       No current facility-administered medications for this visit.       ALLERGIES  Allergies/Contraindications   Allergen Reactions    Diphenoxylate-Atropine Abdominal Pain     Severe stomach pain     Caffeine Anxiety     "Jitters"       PHYSICAL EXAM- LIMIT EXAM BY VIDEO  Objective: ECOG1  Constitutional:  Pleasant over video  HENT:  Normocephalic, Neck- Supple.   SKIN: No rash, noted Eyes:  PERRL, EOMI, sclera appear clear  Respiratory:  Chest excursions good Musculoskeletal: Moving all extremities.   Neurologic:  Alert & oriented x 3, nl affect, No focal deficits noted.       RESULTS: Labs in [facility] were reviewed. Lab results reviewed and interpretation provided to patient  Lab Results   Component Value Date    WBC Count 4.3 03/07/2023    Hemoglobin 10.9 (L) 03/07/2023    Hematocrit 34.9 03/07/2023    MCV 93 03/07/2023    Platelet Count 155 03/07/2023     Lab Results   Component Value Date    Creatinine 0.53 (L) 03/07/2023    Creatinine Whole Blood 0.8 06/05/2014         Outside Labs:    No results found.  ASSESSMENT & PLAN  1. Multiple Myeloma: Impression: established - stable - OFF therapy - requiring disease monitoring  This patient is off therapy and at high-risk for progression and requiring intensive monitoring. The patient's last therapy has been DPd - this was toxic in that she had infectious comlications. She now has progression.      We reviewed the labs: SIFE, SPEP,  SFLC, CBC => Hgb 11.4, m-prot 1, KLC 71.4, LLC 12.9, Hgb 10.9. PETCT 01/2023 - No new hypermetabolic disease.    We discussed that she can consider a low-dose regimen or could continue to wait and watch. The regimen  that Dr. Daphine Deutscher recommended and discussed with patietn previously was: Ixaz/cyt/medrol as follows:    IxazCytMedrol  - Ixazomib 2 mg Day 1, 8, 15  - Cytoxan 300 mg Day 1, 8, 15  - Medrol 8 mg Day 1, 8, 15    At 03/25/23 visit, patient expressed concerns for Cytoxan and the potential side effects. She prefers Pom.      We discussed continuing monitor of monthly labs for now, as she continues to recover physically and also so she can attend to her son's immediate medical needs. Her myeloma markers remain stable.    Supportive care:  Acyclovir,   To prevent neuropathy  - Vit B6 50 mg  - Alpha-lipoic acid 200 mg daily  - L-Carnitine 500 mg daily  - Zofran as needed for nausea    Weekly blood tests - CBC, CMV pcr  Every 2 weeks - CMP  Every 4 weeks - Myeloma labs      The patient has a high complexity problem: multiple myeloma, which is an incurable malignancy and the patient is receiving active monitorring, and can have relapse which poses a threat to life and bodily function.    Regarding data complexity, I have reviewed the following tests => SPEP, SIFE, SFLCs, CBC and CMP.      Medical Decision Making/ high-risk  Plan:   - Continue OFF therapy or consider starting Ixaz/Cyt/medrol.  - Continue to follow myeloma protein levels every 4-6 weeks  - Follow-up in 3 months     2. Immunocompromised state/Infectious Disease concerns: Impression: established - immunocompromised from disease/treatment:  The patient is immunocompromised and is at high-risk from infection due to therapy. Has been avoiding Covid exposure. Following guidelines.    Requiring close follow-up for symptoms.  At risk for VZV reactivation and is on ACV. No current signs of infection. Will call with new signs of infection        Plan: - Continue  prophylaxis - ACV  - Follow CMV pcr  - yearly flu vaccine  - Needs to shelter from Covid exposure  - The patient will call with any symptoms or signs of infection    3. Depression/Psychologic/Social Stressors: Impression: established - stable  The patient has cancer and has situation depression and is at risk for anxiety. Needs close follow-up    Currently, the attitude is good. There are no signs of anxiety or active depression. Mood appropriate, understands plan      Plan: No additional medications needed    4. Anemia: Impression: established - stable    The blood counts are stable, Hgb 10.9. Monitor labs monthly.      Plan: No transfusions or cytokines needed today.    5. Neutropenia/Thrombocytopenia: Impression: established - stable    The disease and prior treatment has toxicity on the bone marrow - no current signs of suppression    Plan: No cytokines or transfusions    6. RI/ Electrolyte Replacement: Impression: established - stable     Taking ample fluids and avoiding nephrotoxins. Lytes & Cr Okay, appears euvolemic. Appetite good and weight unchanged. No new medications needed.     Plan: Continue adequate fluid intake & balanced, nutritional diet   - Continue as much exercise as possible.  - No additional supplementation needed.    7. Osteopenia: Impression: established - stable:    The patient is at risk for fracture due to MM. We recommend Calcium and Vit D replacement. We also recommend an aggressive exercise program.  Plan: - Consider Biphosphonates    8. Secondary Cancer Risk: impression: established - NEEDS close follow-up  The patient has high-risk of secondary malignancies. Needs close follow-up and routine cancer screening.    Plan: - Derm follow-up yearly  - Colonoscopy every 5 years  - Screening per PCP.     Follow up: 1 month with Harriett Sine,  2 months with Dr. Moshe Salisbury NP  Hematology, Blood & Marrow Transplant, and Cellular Therapy  [facility]  office [phone]  fax [phone]  03/25/23     I, Mills Koller, NP, spent a total of 40 minutes on this patient's care on the day of their visit excluding time spent related to any billed procedures. This time includes time spent with the patient as well as time spent documenting in the medical record, reviewing patient's records and tests, obtaining history, placing orders, communicating with other healthcare professionals, counseling the patient, family, or caregiver, and/or care coordination for the diagnoses above.     This is an independent service.   The available consultant for this service is Alanson Aly, MD.

## 2023-04-02 LAB — COMPLETE BLOOD COUNT WITH DIFFERENTIAL: MCV: 92 fL (ref 79–97)

## 2023-04-04 LAB — IMMUNOFIXATION ELECTROPHORESIS, SERUM
IgA, Serum: 55 mg/dL — ABNORMAL LOW (ref 87–352)
IgG: 1750 mg/dL — ABNORMAL HIGH (ref 586–1602)
IgM, Serum: 66 mg/dL (ref 26–217)

## 2023-04-04 LAB — PROTEIN ELECTROPHORESIS, SERUM
A/G Ratio: 1 (ref 0.7–1.7)
Alpha 2 Globulin: 0.7 g/dL (ref 0.4–1.0)
Beta Globulin: 1.1 g/dL (ref 0.7–1.3)
Gamma Globulin: 1.6 g/dL (ref 0.4–1.8)
Globulin, Total: 3.6 g/dL (ref 2.2–3.9)
Protein, Total, Serum / Plasma: 7.2 g/dL (ref 6.0–8.5)

## 2023-04-04 LAB — CYTOMEGALOVIRUS DNA, QUANTITATIVE PCR, PLASMA: Cytomegalovirus DNA, Quantitative PCR: NEGATIVE [IU]/mL

## 2023-04-04 LAB — KAPPA AND LAMBDA FREE LIGHT CHAINS, SERUM
Kappa Light Chain, Serum, Free: 95.5 mg/L — ABNORMAL HIGH (ref 3.3–19.4)
Kappa/Lambda Ratio, Serum, Free: 7.46 — ABNORMAL HIGH (ref 0.26–1.65)
Lambda Light Chain, Serum, Free: 12.8 mg/L (ref 5.7–26.3)

## 2023-04-07 LAB — IRON, % SATURATION AND TRANSFERRIN OR TIBC (DEPENDING ON THE LAB)
Iron, serum: 57 ug/dL
Transferrin Saturation: 13 % Saturation — ABNORMAL LOW
Transferrin, Serum/Plasma: 304 mg/dL

## 2023-04-07 LAB — VITAMIN B12: Vitamin B12: 740 pg/mL (ref 232–1245)

## 2023-04-07 LAB — FOLATE: Folate (Folic Acid), Serum: >20 ng/mL (ref >3.0)

## 2023-04-07 LAB — FERRITIN: Ferritin, Serum/Plasma: 34 ng/mL (ref 15–150)

## 2023-04-20 IMAGING — MR RM PELVE COM CONTRASTE (não inclui articulações coxofemorais)
20 of 47 series · 20 of 47 positions shown · non-contrast
Comparison: none

[Series 3: T2 · axial · 5.0mm · 0.68mm/px · 1 of 33 slices shown (1 of 4)]
[im 1/33]
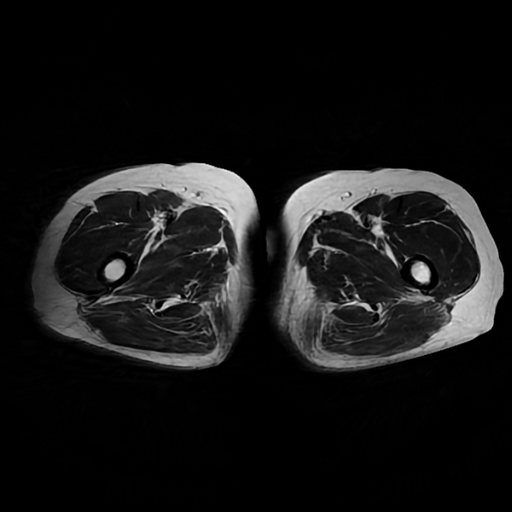

[Series 5: T2 · sagittal · 4.0mm · 0.55mm/px · 1 of 26 slices shown (2 of 4)]
[im 1/26]
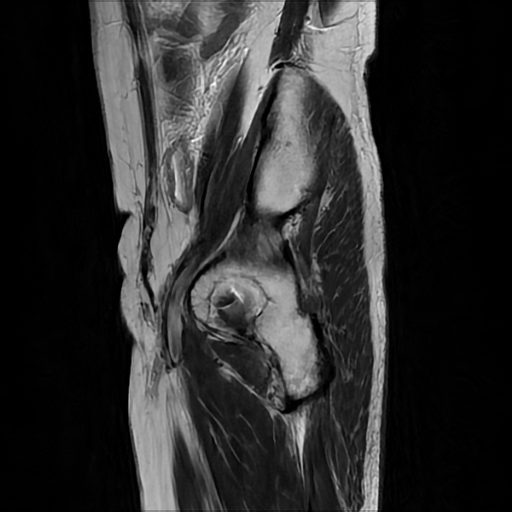

[Series 7: ax dual echo · axial · 5.0mm · 0.68mm/px · 1 of 66 slices shown]
[im 1/66]
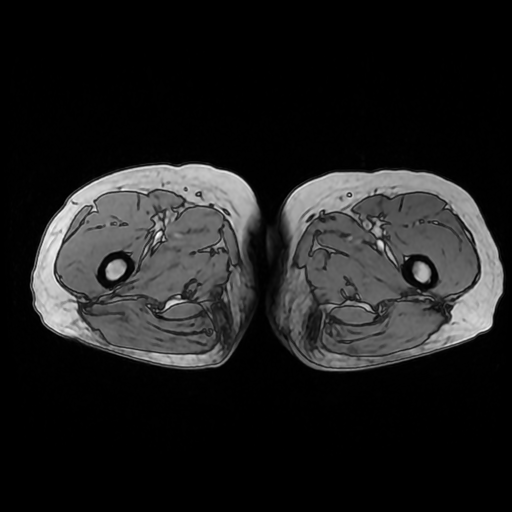

[Series 11: T2 · coronal · 5.5mm · 0.84mm/px · 1 of 26 slices shown (3 of 4)]
[im 1/26]
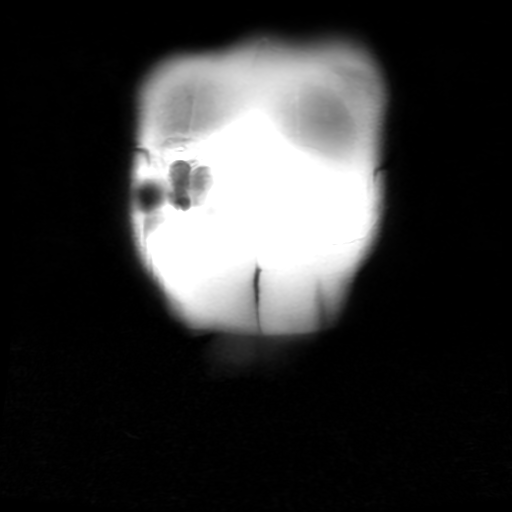

[Series 13: T2 · coronal · 5.5mm · 0.84mm/px · 1 of 26 slices shown (4 of 4)]
[im 1/26]
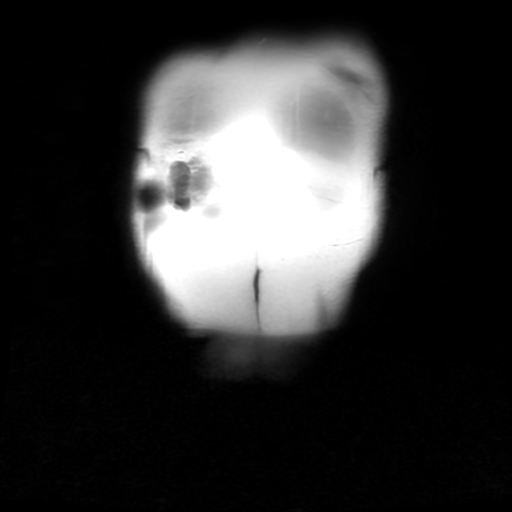

[Series 15: DWI · axial · 7.0mm · 1.64mm/px · 1 of 60 slices shown]
[im 1/60]
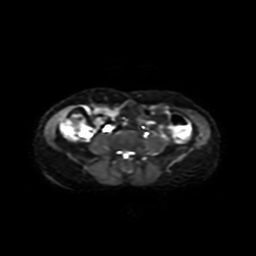

[Series 250: ADC · axial · 5.0mm · 1.37mm/px · 1 of 30 slices shown (1 of 2)]
[im 1/30]
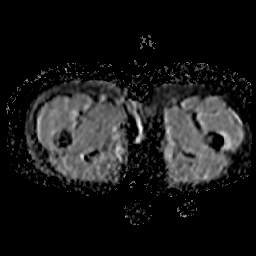

[Series 1550: ADC · axial · 7.0mm · 1.64mm/px · 1 of 30 slices shown (2 of 2)]
[im 1/30]
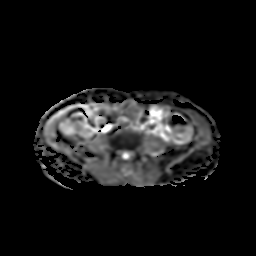

[Series 1601: T1 dynamic · axial · 4.4mm · 0.78mm/px · 1 of 122 slices shown (1 of 7)]
[im 1/122]
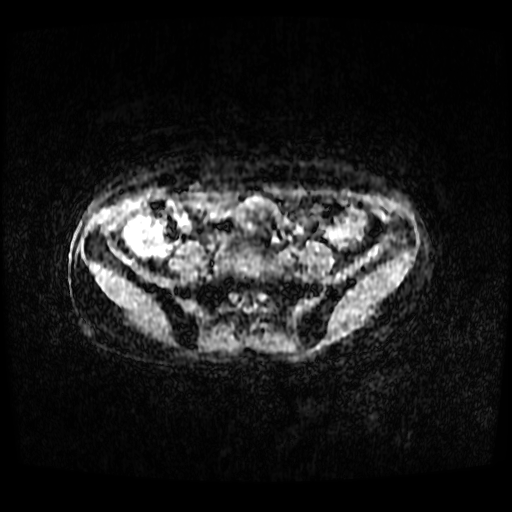

[Series 1625: T1 dynamic · axial · 4.4mm · 0.78mm/px · 1 of 122 slices shown (2 of 7)]
[im 1/122]
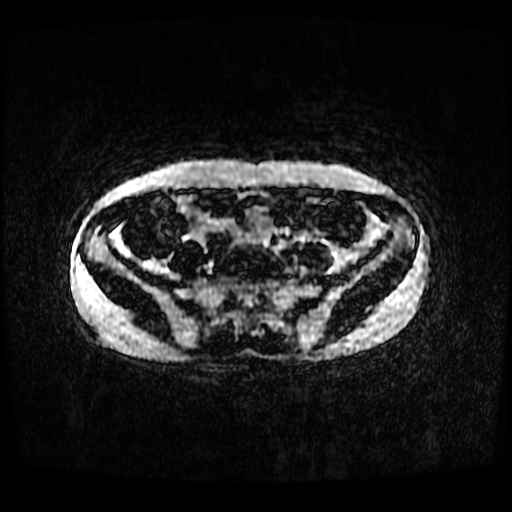

[Series 1627: T1 dynamic · axial · 4.4mm · 0.78mm/px · 1 of 122 slices shown (3 of 7)]
[im 1/122]
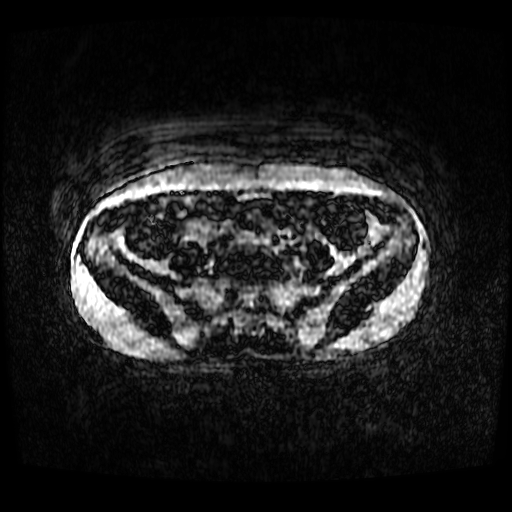

[Series 1651: T1 dynamic · axial · 4.4mm · 0.78mm/px · 1 of 122 slices shown (4 of 7)]
[im 1/122]
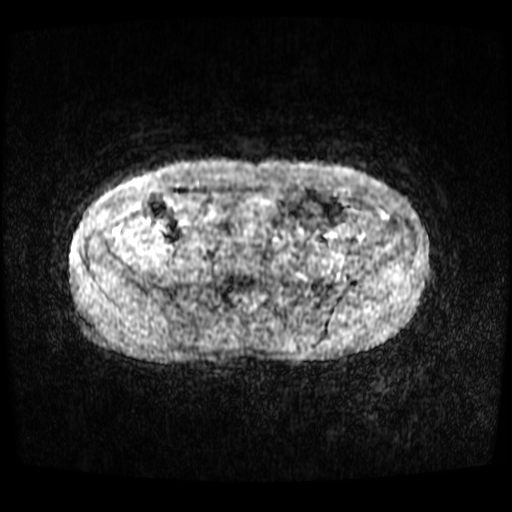

[Series 1653: T1 dynamic · axial · 4.4mm · 0.78mm/px · 1 of 122 slices shown (5 of 7)]
[im 1/122]
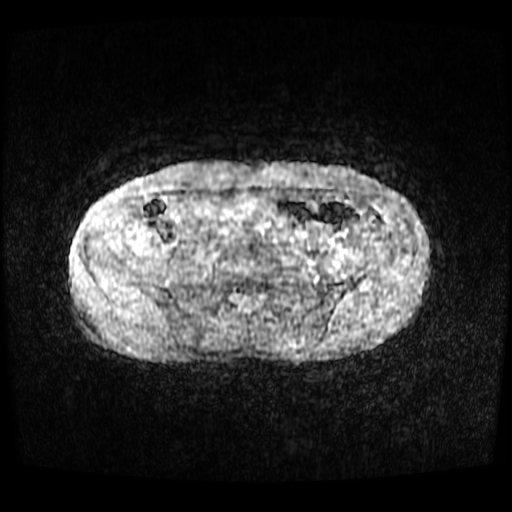

[Series 1676: T1 dynamic · axial · 4.4mm · 0.78mm/px · 1 of 122 slices shown (6 of 7)]
[im 1/122]
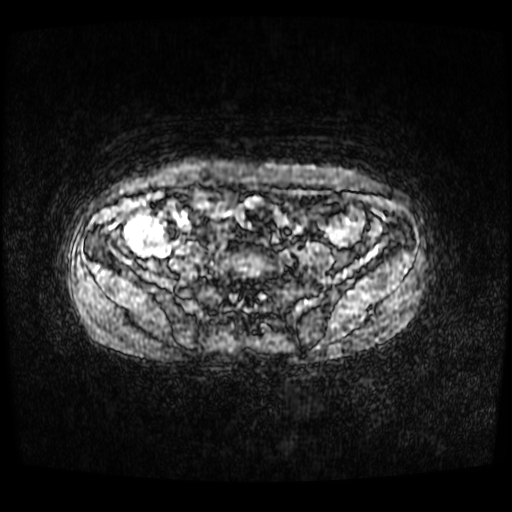

[Series 1800: T1 dynamic · sagittal · 4.7mm · 0.59mm/px · 1 of 70 slices shown (7 of 7)]
[im 1/70]
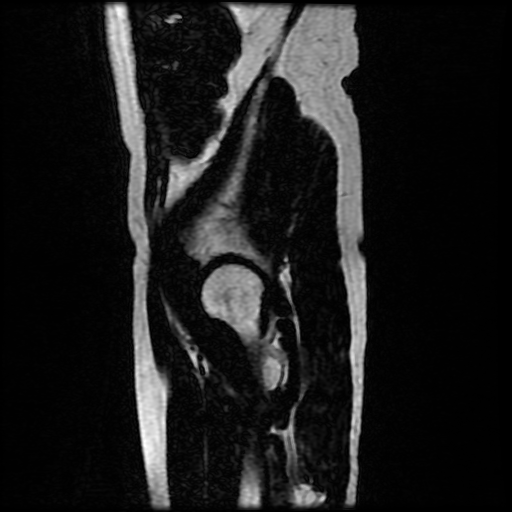

[((id)/(id)/1)-((id)/(id)/1) · axial · 4.4mm · 0.78mm/px · 1 of 122 slices shown (1 of 5)]
[im 1/122]
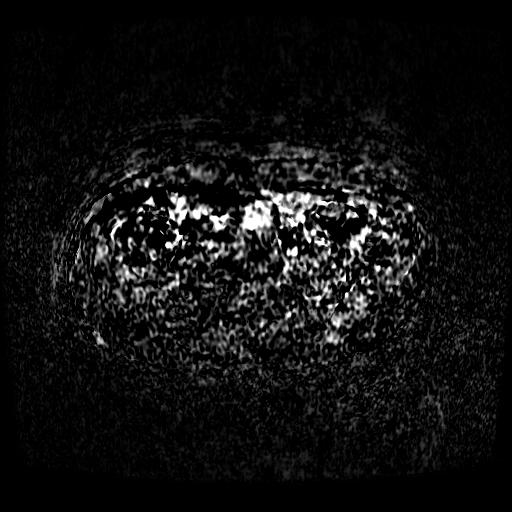

[((id)/(id)/1)-((id)/(id)/1) · axial · 4.4mm · 0.78mm/px · 1 of 122 slices shown (2 of 5)]
[im 1/122]
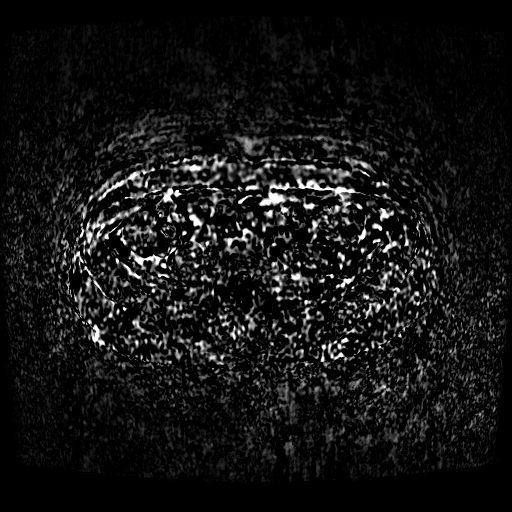

[((id)/(id)/1)-((id)/(id)/1) · axial · 4.4mm · 0.78mm/px · 1 of 122 slices shown (3 of 5)]
[im 1/122]
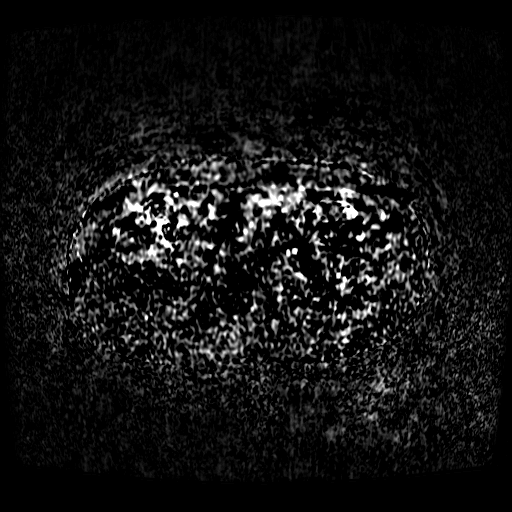

[((id)/(id)/1)-((id)/(id)/1) · axial · 4.4mm · 0.78mm/px · 1 of 122 slices shown (4 of 5)]
[im 1/122]
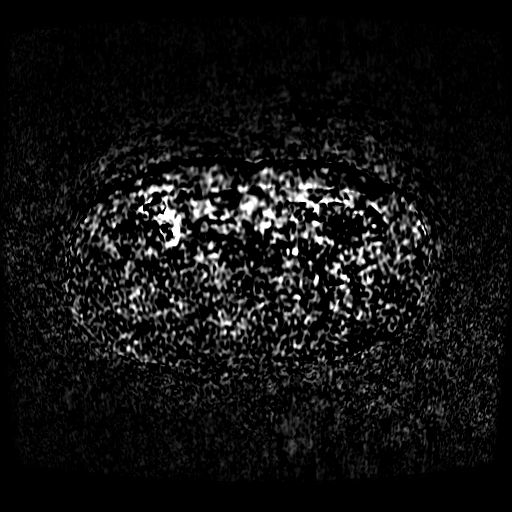

[((id)/(id)/1)-((id)/(id)/1) · axial · 4.4mm · 0.78mm/px · 1 of 122 slices shown (5 of 5)]
[im 1/122]
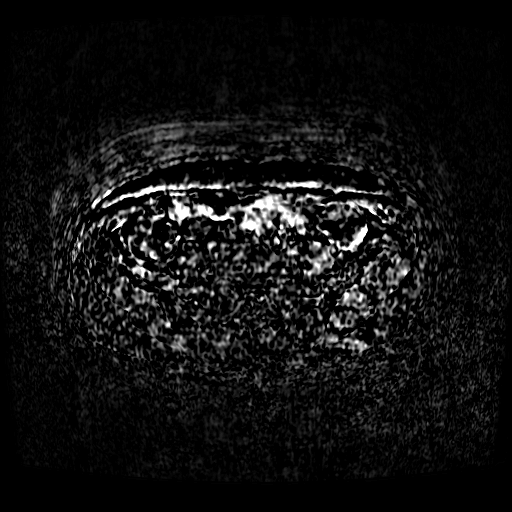

[20 of 47 positions shown; findings below may reference images not displayed]

RESSONÂNCIA MAGNÉTICA DE ABDOME SUPERIOR

TÉCNICA: 
Exame realizado em equipamento de ressonância magnética com sequências, ponderações e planos específicos para o segmento de interesse, antes e após a administração endovenosa do meio de contraste.

RESULTADO:
Planos musculares e estruturas ósseas com intensidade de sinal normal.
Fígado com forma, contornos, dimensões e intensidade de sinal normal, sem evidencias de lesões focais.
Veias porta e supra-hepáticas pérvias.
Não há dilatação da árvore biliar intra ou extra-hepática.
Vesícula biliar com forma e dimensões normais, sem evidências de cálculos no interior.
Baço sem alterações.
Pâncreas com forma normal e intensidade de sinal preservada.
Rins tópicos, com forma, dimensões e intensidade de sinal normal. 
Acentuada dilatação pielocalciinal e ureteral proximal à direita, com tortuosidade ureteral.
Adrenais identificadas com aspecto habitual. 
Alças intestinais sem sinais aparentes de alterações focais.
Ausência de líquido ascítico. 
Grandes vasos abdominais de calibres preservados, sem evidências de linfonodomegalias ao redor.

CONCLUSÃO:
Acentuada dilatação pielocalciinal e ureteral proximal à direita, com tortuosidade ureteral.

## 2023-04-28 LAB — COMPREHENSIVE METABOLIC PANEL (BMP, AST, ALT, T.BILI, ALKP, TP ALB)
AST: 24 IU/L (ref 0–40)
Alanine transaminase: 16 IU/L (ref 0–32)
Albumin, Serum / Plasma: 4.2 g/dL (ref 3.9–4.9)
Albumin/Globulin Ratio: 1.8
Alkaline Phosphatase: 121 IU/L (ref 44–121)
BUN/Creatinine Ratio (External Lab): 21 (ref 12–28)
Bilirubin, Total: 1 mg/dL (ref 0.0–1.2)
Calcium, total, Serum / Plasma: 10 mg/dL (ref 8.7–10.3)
Carbon Dioxide, Total: 21 mmol/L (ref 20–29)
Chloride, Serum / Plasma: 108 mmol/L — ABNORMAL HIGH (ref 96–106)
Creatinine: 0.63 mg/dL (ref 0.57–1.00)
Globulin, Total: 2.4 g/dL (ref 1.5–4.5)
Glucose, non-fasting: 85 mg/dL (ref 70–99)
Potassium, Serum / Plasma: 3.9 mmol/L (ref 3.5–5.2)
Protein, Total, Serum / Plasma: 6.6 g/dL (ref 6.0–8.5)
Sodium, Serum / Plasma: 142 mmol/L (ref 134–144)
Urea Nitrogen, serum/plasma: 13 mg/dL (ref 8–27)
eGFRcr: 100 mL/min/{1.73_m2} (ref >59)

## 2023-04-28 LAB — COMPLETE BLOOD COUNT WITH DIFFERENTIAL
% Basophils: 0 %
% Eosinophils: 2 %
% Immature Granulocytes: 1 %
% Lymphocytes: 40 %
% Monocytes: 11 %
% Neutrophils: 46 %
Abs Basophils: 0 10*3/uL (ref 0.0–0.2)
Abs Eosinophils: 0.1 10*3/uL (ref 0.0–0.4)
Abs Lymphocytes: 1.8 10*3/uL (ref 0.7–3.1)
Abs Monocytes: 0.5 10*3/uL (ref 0.1–0.9)
Abs Neutrophils: 2.1 10*3/uL (ref 1.4–7.0)
Hematocrit: 37.3 % (ref 34.0–46.6)
Hemoglobin: 12 g/dL (ref 11.1–15.9)
MCH: 29.1 pg (ref 26.6–33.0)
MCHC: 32.2 g/dL (ref 31.5–35.7)
MCV: 91 fL (ref 79–97)
Platelet Count: 141 10*3/uL — ABNORMAL LOW (ref 150–450)
RBC Count: 4.12 x10E6/uL (ref 3.77–5.28)
RDW-CV: 15.6 % — ABNORMAL HIGH (ref 11.7–15.4)
WBC Count: 4.5 10*3/uL (ref 3.4–10.8)

## 2023-04-28 LAB — LACTATE DEHYDROGENASE, BLOOD: LDH, serum/plasma: 199 IU/L (ref 119–226)

## 2023-04-29 ENCOUNTER — Telehealth: Admit: 2023-05-01 | Payer: PRIVATE HEALTH INSURANCE | Attending: Nurse Practitioner

## 2023-04-29 DIAGNOSIS — C9000 Multiple myeloma not having achieved remission: Secondary | ICD-10-CM

## 2023-04-29 DIAGNOSIS — F419 Anxiety disorder, unspecified: Secondary | ICD-10-CM

## 2023-04-29 LAB — CYTOMEGALOVIRUS DNA, QUANTITATIVE PCR, PLASMA: Cytomegalovirus DNA, Quantitative PCR: POSITIVE IU/mL

## 2023-04-29 NOTE — Progress Notes (Signed)
Ariel Braun  [id number]    Date of Service: 04/29/2023    Reason for visit / Chief Complaints:    Subjective   Ariel Braun is a 62 y.o. female with MM who is on VIDEO follow-up    I performed this consultation using real-time Telehealth tools, including a live video connection between my location and the patient's location. Prior to initiating the consultation, I obtained informed verbal consent to perform this consultation using Telehealth tools and answered all the questions about the Telehealth interaction. Patient in [place].    Events since the last visit: The patient has been OFF therapy. She had a prolonged hospitalization in early 2023 for ~ 5 months (CMV colitis, norovirus infection (chronic), FTT). Then readmitted ~6/8-7/13/23 - and transitioned to SNF for an extended period of time after discharge. Off feeding tube since 06/2022, taking all pills orally since 07/2022. Has been home since Nov 2023.     Overall doing better. Feeling stronger, working on going up and down stairs.  Legs better but not at 100%, no falls. No fevers or infectious symptoms. Appetite good, does not feel like she needs remeron anymore for appetite but want to make sure her anxiety is managed.      No new bone pain or signs of progressive MM. No kidney/urinary difficuties. No f/chills, URI symptoms, sinus congestion, sore throat or other infectious symptoms. No N/V/dysuria/diarrhea.  No CP, palp.  No bleeding or bruising.    Continues to deal with mental stressors. Has been taking care of her son who is ill and eventually will need brain surgery. Her husband has diabetes.  Daughter, [name], who has been her main caregiver, has been sick with GI symptoms in the past year, diagnosed with [facility] and responded poorly with Cipro, fainting spells. Daughter has been unable to work for the past year.    Oncology history:  DX:   IgG kappa myeloma, diagnosed 09/07/10   - BMBx: 8% plasma cells, normal cyto 46XX, FISH showed only 3 copies  1q21, Stage 2 ISS (3.88 beta 2 microglobulin),IgG 4750, M-spike 3.2, kappa 586.9 mg/L.     TREATMENT  1. LINE1: RVD 10/2010 through April 2011 (? 4 cycles). Achieved VGPR with M-spike 0.4, IgG 886, kappa 27.6.   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 M-spike 0.6, IgG 1270.     3. LINE2: 08/05/14: Started Rev 15 mg 3/4. M-spike 1, IgG 1640, kappa 62.2 - SD    Biochemical PD 04/17/19: M-1.4, KLC 96.3. ? PD. Up Rev to 15 mg qd (3/4) and dex 8 mg wkly(4/4).       07/20/19: L hip pain. M-1.2, KLC 96. Cont. Rev 15 mg (3/4)/dex 8 mg (4/4).     4. LINE4: 12/2019: DaraPomDex.       Past Medical History:   Diagnosis Date    Acid reflux disease     Anxiety     Depression     Hereditary angioedema (CMS code)     Multiple myeloma, without mention of having achieved remission     IgG kappa    Myeloma (CMS code) 05/10/2011     Past medical, family and social histories, as well as medications and allergies, were reviewed and updated in the medical record as appropriate.    Current Outpatient Medications   Medication Sig Dispense Refill    acyclovir (ZOVIRAX) 400 mg tablet Take 1 tablet (400 mg total)  by mouth in the morning and 1 tablet (400 mg total) in the evening. 180 tablet 3    apixaban (ELIQUIS) 5 mg tablet Take 1 tablet (5 mg total) by mouth 2 (two) times daily      cholecalciferol, vitamin D3, 1000 UNITS tablet 2 tablets (2,000 Units total) by Feeding Tube route daily (Patient taking differently: Take 2 tablets (2,000 Units total) by mouth daily)      cyanocobalamin, Vitamin B12, (VITAMIN B-12) 1,000 mcg tablet Take 1 tablet (1,000 mcg total) by mouth daily      folic acid (FOLVITE) 1 mg tablet 1 tablet (1 mg total) by Feeding Tube route daily (Patient taking differently: Take 1 tablet (1 mg total) by mouth daily)      HYDROcodone-acetaminophen (NORCO) 5-325 mg tablet 1-2 tablets by Feeding Tube route every 6 (six) hours as needed for Pain 30 tablet 0     hydrOXYzine (ATARAX) 25 mg tablet 1 tablet (25 mg total) by Feeding Tube route every 4 (four) hours as needed for Anxiety      mirtazapine (REMERON) 30 mg tablet 1 tablet (30 mg total) by Per G Tube route nightly at bedtime      multivitamin with iron-minerals-folic acid (THERA M PLUS) 9 mg iron-400 mcg tablet 1 tablet by Feeding Tube route daily      ondansetron (ZOFRAN) 8 mg tablet 1 tablet (8 mg total) by Feeding Tube route in the morning and 1 tablet (8 mg total) at noon and 1 tablet (8 mg total) in the evening. (Patient not taking: Reported on 09/09/2022)      prochlorperazine (COMPAZINE) 5 mg tablet 1 tablet (5 mg total) by Feeding Tube route every 8 (eight) hours as needed for Nausea (Patient not taking: Reported on 09/09/2022)      therapeutic multivitamin (THERAGRAN) tablet Take 1 tablet by mouth daily      thiamine mononitrate, vit B1, 100 mg tablet 1 tablet (100 mg total) by Feeding Tube route daily (Patient taking differently: Take 1 tablet (100 mg total) by mouth daily)       No current facility-administered medications for this visit.       ALLERGIES  Allergies/Contraindications   Allergen Reactions    Diphenoxylate-Atropine Abdominal Pain     Severe stomach pain     Caffeine Anxiety     "Jitters"       PHYSICAL EXAM- LIMIT EXAM BY VIDEO  Objective: ECOG1  Constitutional:  Pleasant over video  HENT:  Normocephalic, Neck- Supple.   SKIN: No rash, noted Eyes:  PERRL, EOMI, sclera appear clear  Respiratory:  Chest excursions good Musculoskeletal: Moving all extremities.   Neurologic:  Alert & oriented x 3, nl affect, No focal deficits noted.       RESULTS: Labs in [facility] were reviewed. Lab results reviewed and interpretation provided to patient  Lab Results   Component Value Date    WBC Count 4.5 04/27/2023    Hemoglobin 12.0 04/27/2023    Hematocrit 37.3 04/27/2023    MCV 91 04/27/2023    Platelet Count 141 (L) 04/27/2023     Lab Results   Component Value Date    Creatinine 0.63 04/27/2023    Creatinine  Whole Blood 0.8 06/05/2014         Outside Labs:    No results found.  ASSESSMENT & PLAN  1. Multiple Myeloma: Impression: established - stable - OFF therapy - requiring disease monitoring  This patient is off therapy and at high-risk for progression  and requiring intensive monitoring. The patient's last therapy has been DPd - this was toxic in that she had infectious comlications. She now has progression.      We reviewed the labs from 04/27/23: SIFE, SPEP, SFLC, CBC => Hgb 12, m-prot pending (last 1), KLC 105.2 (last 95.5), LLC 10.8, K/L ratio 9.74 (last 7.46), Hgb 10.9, Cr 0.63, Ca 10, Alb 4.2.     PETCT 01/2023 - No new hypermetabolic disease.    We discussed that she can consider a low-dose regimen or could continue to wait and watch. The regimen that Dr. Daphine Deutscher recommended and discussed with patient previously was: Ixaz/cyt/medrol as follows:    IxazCytMedrol  - Ixazomib 2 mg Day 1, 8, 15  - Cytoxan 300 mg Day 1, 8, 15  - Medrol 8 mg Day 1, 8, 15    At 03/25/23 visit, patient expressed concerns for Cytoxan and the potential side effects. She prefers Pom.      She is ready to restart myeloma therapy and wants to consider Pom in lieu of Cytoxan.    Supportive care:  Acyclovir  Continue Eliquis for dvt ppx if on imid  To prevent neuropathy  - Vit B6 50 mg  - Alpha-lipoic acid 200 mg daily  - L-Carnitine 500 mg daily  - Zofran as needed for nausea    Follow the below labs after starting therapy, otherwise labs monthly/.  Weekly blood tests - CBC, CMV pcr  Every 2 weeks - CMP  Every 4 weeks - Myeloma labs      The patient has a high complexity problem: multiple myeloma, which is an incurable malignancy and the patient is receiving active monitorring, and can have relapse which poses a threat to life and bodily function.    Regarding data complexity, I have reviewed the following tests => SPEP, SIFE, SFLCs, CBC and CMP.      Medical Decision Making/ high-risk  Plan:   - Continue OFF therapy or consider starting  Ixaz/Cyt/medrol.  - Continue to follow myeloma protein levels every 4-6 weeks  - Follow-up in 3 months     2. Immunocompromised state/Infectious Disease concerns: Impression: established - immunocompromised from disease/treatment:  The patient is immunocompromised and is at high-risk from infection due to therapy. Has been avoiding Covid exposure. Following guidelines.    Requiring close follow-up for symptoms.  At risk for VZV reactivation and is on ACV. No current signs of infection. Will call with new signs of infection        Plan: - Continue prophylaxis - ACV  - Follow CMV pcr  - yearly flu vaccine  - Needs to shelter from Covid exposure  - The patient will call with any symptoms or signs of infection    3. Depression/Psychologic/Social Stressors: Impression: established - stable  The patient has cancer and has situation depression and is at risk for anxiety. Needs close follow-up    Currently, the attitude is good. There are no signs of anxiety or active depression. Mood appropriate, understands plan      Plan: No additional medications needed    4. Anemia: Impression: established - stable    The blood counts are stable, Hgb 10.9. Monitor labs monthly.      Plan: No transfusions or cytokines needed today.    5. Neutropenia/Thrombocytopenia: Impression: established - stable    The disease and prior treatment has toxicity on the bone marrow - no current signs of suppression    Plan: No cytokines or transfusions  6. RI/ Electrolyte Replacement: Impression: established - stable     Taking ample fluids and avoiding nephrotoxins. Lytes & Cr Okay, appears euvolemic. Appetite good and weight unchanged. No new medications needed.     Plan: Continue adequate fluid intake & balanced, nutritional diet   - Continue as much exercise as possible.  - No additional supplementation needed.    7. Osteopenia: Impression: established - stable:    The patient is at risk for fracture due to MM. We recommend Calcium and Vit D  replacement. We also recommend an aggressive exercise program.    Plan: - Consider Biphosphonates    8. Secondary Cancer Risk: impression: established - NEEDS close follow-up  The patient has high-risk of secondary malignancies. Needs close follow-up and routine cancer screening.    Plan: - Derm follow-up yearly  - Colonoscopy every 5 years  - Screening per PCP.     Follow up: 1 month with Harriett Sine,  2 months with Dr. Moshe Salisbury NP  Hematology, Blood & Marrow Transplant, and Cellular Therapy  [place] [facility]  office [phone]  fax [phone]  04/29/23     I, Mills Koller, NP, spent a total of 40 minutes on this patient's care on the day of their visit excluding time spent related to any billed procedures. This time includes time spent with the patient as well as time spent documenting in the medical record, reviewing patient's records and tests, obtaining history, placing orders, communicating with other healthcare professionals, counseling the patient, family, or caregiver, and/or care coordination for the diagnoses above.

## 2023-04-29 NOTE — Patient Instructions (Addendum)
I will discuss with Dr. Daphine Deutscher regarding starting Pom/Ixazomib/Methypred.  If he agrees with plan, I will let you know dosing and start the authorization process.    Symptom management referral to help with managing anxiety  Clinic Phone Number: [phone]. Please call the clinic if you do not receive a call within 1 week to schedule an appointment.    Continue labs monthly    Follow up with Harriett Sine in 4 weeks and Dr. Daphine Deutscher in 8 weeks

## 2023-05-02 MED ORDER — METHYLPREDNISOLONE 4 MG TABLET
4 | ORAL_TABLET | ORAL | 0 refills | Status: DC
Start: 2023-05-02 — End: 2023-05-03

## 2023-05-02 MED ORDER — POMALIDOMIDE 2 MG CAPSULE
2 | ORAL_CAPSULE | Freq: Every day | ORAL | 0 refills | Status: DC
Start: 2023-05-02 — End: 2023-05-20

## 2023-05-02 MED ORDER — IXAZOMIB 3 MG CAPSULE
3 | ORAL_CAPSULE | ORAL | 0 refills | Status: DC
Start: 2023-05-02 — End: 2023-06-07

## 2023-05-03 MED ORDER — METHYLPREDNISOLONE 8 MG TABLET
8 | ORAL_TABLET | ORAL | 0 refills | Status: DC
Start: 2023-05-03 — End: 2023-06-07

## 2023-05-05 LAB — PROTEIN ELECTROPHORESIS, SERUM
A/G Ratio: 0.9 (ref 0.7–1.7)
Albumin, sPEP: 3.3 g/dL (ref 2.9–4.4)
Alpha 1 Globulin: 0.3 g/dL (ref 0.0–0.4)
Alpha 2 Globulin: 0.6 g/dL (ref 0.4–1.0)
Beta Globulin: 1.1 g/dL (ref 0.7–1.3)
Gamma Globulin: 1.5 g/dL (ref 0.4–1.8)
Globulin, Total: 3.5 g/dL (ref 2.2–3.9)
M-Protein (monoclonal), Serum: 1.2 g/dL — ABNORMAL HIGH
Protein, Total, Serum / Plasma: 6.8 g/dL (ref 6.0–8.5)

## 2023-05-05 LAB — IMMUNOFIXATION ELECTROPHORESIS, SERUM
IgA, Serum: 55 mg/dL — ABNORMAL LOW (ref 87–352)
IgG: 1691 mg/dL — ABNORMAL HIGH (ref 586–1602)
IgM, Serum: 54 mg/dL (ref 26–217)

## 2023-05-05 LAB — KAPPA AND LAMBDA FREE LIGHT CHAINS, SERUM
Kappa Light Chain, Serum, Free: 105.2 mg/L — ABNORMAL HIGH (ref 3.3–19.4)
Kappa/Lambda Ratio, Serum, Free: 9.74 — ABNORMAL HIGH (ref 0.26–1.65)
Lambda Light Chain, Serum, Free: 10.8 mg/L (ref 5.7–26.3)

## 2023-05-17 ENCOUNTER — Telehealth: Admit: 2023-05-20 | Payer: PRIVATE HEALTH INSURANCE

## 2023-05-17 DIAGNOSIS — F419 Anxiety disorder, unspecified: Secondary | ICD-10-CM

## 2023-05-17 NOTE — Progress Notes (Unsigned)
Subjective    Ariel Braun is a 62 y.o. female with multiple myeloma (diagnosis category: Multiple myeloma (including amyloidosis)  metastatic) who is being seen via video visit while patient is at home at the request of Dr. Dolores Lory of (referral category: Oncology/Rad Onc/Onc Surgery) for symptom management.    Medical team: PCP Dr. Jannetta Quint, oncologist Dr. Dolores Lory  Primary caregiver(s):  Her daughter  Palliative care team member disciplines: Physician    HPI  Ariel Braun is a 62 y.o. female with anxiety    Today patient/family reports:     -feeling OK.      -has a lot of anxiety that is long standing.  Worse since Nov.  Almost weekly severe anxiety.  Has adult son who has intellectual disability.  Also cared for at [facility]. Hard to find correct dx for him despite extensive work up, multiple opinions, etc.  Bone issues cont to develop.  Had knee flare recently and seeing local orthopedic doctor, who feels they need more specialized care at [facility].  Son has passed 40 renal stones.  Clara used to care for him on her own but now daughter helping.   Son has pit tumor that is being evaluated further on Friday.  Son Karleen [place].  Age 43.  Started with pain.  Severe kyphosis.  Scoliosis.  One leg longer than the other and curled toes.  Benign bone tumor near knee.  Tested neg MEN1.  -was worried that because no diagnosis when little, would be too late to help him.  Concerned what is going on with his knee.  Feels helpless re: bone changes.  Scalp growths.  Hyperparathyroidism- has one parathyroid gland in his chest.  Dr. Smith Robert.  Endocrine is managing his parathyroid, pituitary and pancreas.  Wants to go to specialized clinic at [facility] for rare diseases.  All of records at [facility].      -live 2 hours away from [place].      -daughter works during day but is at night with her.  Josefina has signed up with 4 different myeloma foundations and also has anxiety about her own cancer dx    -elderly mom lives with them- husband caring  for her. Daughter is helping.  Says she is 1st generation from [place] and huge expectation that she will take care of everyone as female in family.  She says her husband does not support her around her illness and is emotionally unavailable to her.  She reports she has few friends and is socially isolated, worse after the pandemic.  Lived in neighborhood for decades and many have moved away.      -Dex weekly- can contribute to anxiety.  Eliquis daily.  Pomelist q3 weeks.  Acyclovir to reduce risk of shingles.  Darzilumab q month    -used to walk outside her home but not since COVID.  Fearful of COVID    -says had 4 Pzifer vaccine and no protection.      -pain that starts in upper back and rads to front of chest.  Will stay there.  Can feel N or have diarrhea.  Breathing exercises help.  Has ativan 0.5mg .  Took Ativan or Xanax for 1 year when dx with MM and stopped working.   Stopped it.  Says strong family history of addiction and does not want to take addictive meds for anxiety.  However, it sounds like she still uses Ativan to some extent.  0.5mg  prn.    -uses prayer and meditation to  help cope. Crochet.  Watches Christian programs.     -Reluctant to try SSRI.  Has done so remotely and did not like how she changed.  Says she wants to take non-addictive med on as needed basis.  Does not want to be in fog.  Also had strange dreams on anti-depressants. Needs mind to be clear when decision making for her son.  She wants a prn med that is not addictive.      -lost father and cares for her mom.   Has not used any anti-depressants recently and cannot recall the names of the ones she has tried.      -hereditary angioedema.  Can get in face, throat and stomach.  Carries with her Icatibant to use if has attack.  -stress can bring on attack.      Has seen 3 psychologists over the years.  Did not feel helped her.  Would journal.  Kind of gave up on it.  Felt they did not understand her.  She said she would be most  interested in working with a psychologist with Ephriam Knuckles background who understands her family role as [place] immigrant.      Social history most significant for:  Lives with husband, mom and son, who has intellectual disability.  Isolated.  Cares for son along with help from daughter, whom she is close to.      Fam hx: Strong fam hx of addiction.  Father was alcoholic.  Mom used valium to cope.  Brothers with drug and alcohol addiction.      Patient/Family screened for spiritual care needs: Yes  Patient/Family screened for psychosocial needs: Yes    SMS Well-Being Survey       No data to display                     Objective    Physical Exam:   NAD  Sitting up in bed  Animated and very talkative  Normal neck ROM  Normal resp effort    Performance status (by Palliative Performance Scale): 70% - Reduced ambulation, Unable Normal Job/Work, Significant disease, Occasional assistance necessary, Normal or reduced intake, Full conscious level      Review of Prior Testing  I have personally reviewed and interpreted the following studies:  Lab Results   Component Value Date    WBC Count 4.5 04/27/2023    Hemoglobin 12.0 04/27/2023    Hematocrit 37.3 04/27/2023    MCV 91 04/27/2023    Platelet Count 141 (L) 04/27/2023     Lab Results   Component Value Date    Sodium, Serum / Plasma 142 04/27/2023    Potassium, Serum / Plasma 3.9 04/27/2023    Urea Nitrogen, serum/plasma 13 04/27/2023    Creatinine 0.63 04/27/2023     Lab Results   Component Value Date    Thyroid Stimulating Hormone 3.98 12/15/2021     No results found.    #  Cancer stage at diagnosis- see onc note for staging.      Assessment and Plan    Ariel Braun is a 62 y.o. female with multiple myeloma who presents to the symptom management service with severe, long-standing anxiety.       Writer will send a message regarding status of the insurance approval for new cancer directed treatments.     Anxiety, severe and multifactorial.  Anxiety related to her health,  COVID, her son's health, social isolation and the demands in her family dynamic that  pressure her to feel she has to care for everyone else.    - She reports active prayer and meditation practice.  - Not interested in talk therapy or counseling. She has not felt it was a good fit for her in the past.  - Writer reach out to pharmacist to discuss treatment options which do not affect liver. Will also discuss adding Pristiq to her regimen. Given her son's upcoming surgery will continue on mirtazapine and Pristiq before beginning a cross taper. She does not want to stay on 2 medications for a prolonged time.    Previously discussed:  - She declines "anti-depressant" meds that she has to take daily and says she wants a prn other than Lorazepam or addictive meds.   - She is still taking prn Lorazepam 0.5mg   - Trial of Hydroxyzine 12.5mg  PO.  Advised not addictive but sedating for most people like Benadryl.  Try 12.5mg  at night at first.  Can increase to 25mg  if tolerates 12.5mg  and move to daytime dosing Can take prn every 8 hours for severe anxiety.    - If does not tolerate, consider Guanfacine prn.    - We did not have time to discuss today but Buspirone may also be option for her.   - Could also consider psychiatry-oncology consult for med mgmt.  - Referred to [facility].  Advised they specialize in helping people cope with cancer or severe medical illness and may be able to address issues regarding her myeloma and her son's medical problems (she spent most of our time on her son's health issues).  She is reluctant to reengage with therapy and most interested in [place] therapist who understands her family dynamic.  We discussed that psycho-onc may be helpful in triage capacity.  Also discussed that the behavioral approach of CBT can be very helpful for patients with chronic anxiety and could also discuss referral for CBT with psycho-onc if she is open to it.     - Suggested she consider the  metabolic  bone clinic for evaluation of her son and advised that her PCP or endocrinologist could place consult.   She says she is also looking into rare diseases clinic at [facility], Therapist, occupational is not familiar with.  She feels having a diagnosis for her son will help reduce her anxiety    Advance Care Planning   - Prognostic awareness: N/A  - Relevant values/priorities: Reduction in anxiety, care for her son.    - Surrogate decision maker: Not addressed  - Resuscitation preferences: Unknown  -  AD: Deferred  - POLST: Deferred    - GOC convo deferred to future visit  - Today I spent 0 minutes with the patient specifically discussing ACP.       Time Spent  I spent a total of 25 minutes on this patient's care on the day of their visit excluding time spent related to any billed procedures. This time includes time spent with the patient as well as time spent documenting in the medical record, reviewing patient's records and tests, obtaining history, placing orders, communicating with other healthcare professionals, counseling the patient, family, or caregiver, and/or care coordination for the diagnoses above.        I performed this evaluation using real-time telehealth tools, including a live video Zoom connection between my location and the patient's location. Prior to initiating, the patient consented to perform this evaluation using telehealth tools.        Thank you for allowing [place] to  participate in the care of your patient. Please feel free to reach out with any questions, concerns or ideas about additional ways we could serve your patient & their family.    Follow-up  August 13 at 12:00 PM    Marzella Schlein, MD    I, Meryl Crutch am acting as a Neurosurgeon for services provided by Marzella Schlein, MD on 05/17/23, 12:18 PM.    ***

## 2023-05-18 NOTE — Progress Notes (Signed)
Medication Authorization Note    Medication first appeal submitted for pomalidomide 2mg  cap    Quantity: 21 for 28 days supply for 0 refills.     Status is Pending. Expected review time is 3 day(s). Submitted on 05/18/23 via fax as Urgent Current [facility]. Insurance: [employer]. Pharmacy: [facility] .     Additional information included: clinic note and supporting articles    Follow-up items: medication coverage    Patient and provider will be notified of authorization status. Document(s) will be attached in patient chart under Scan Admin or available in the Medication Report.    Nickolaos Brallier Regino Schultze, PharmD

## 2023-05-20 MED ORDER — POMALIDOMIDE 2 MG CAPSULE
2 | ORAL_CAPSULE | Freq: Every day | ORAL | 0 refills | Status: DC
Start: 2023-05-20 — End: 2023-06-06

## 2023-05-24 MED ORDER — DESVENLAFAXINE SUCCINATE ER 25 MG TABLET,EXTENDED RELEASE 24 HR
25 | ORAL_TABLET | Freq: Every morning | ORAL | 1 refills | Status: DC
Start: 2023-05-24 — End: 2023-06-20

## 2023-05-24 NOTE — E-Consult Note (Signed)
I am requesting a pharmacy eConsult for this 62 y.o. woman        My clinical question: Pt with chronic liver disease who wants antidepressant for anxiety that is safe to use in liver dz over mirtazapine.  Ideally wants one that is not hepatically cleared and not addictive.  I think Pristiq may be good option but would  like pharmacy input.  Please advise.  Has multiple myeloma.            Please ensure that:               - Medication list is up to date and, if differing from the list,                  the use and timing of relevant medicines is clear in the clinical question above.    SPECIALIST'S E-CONSULT RESPONSE      Desvenlafaxine (Pristiq) would be preferred over mirtazapine for a patient with liver disease. There are varying degrees of hepatic metabolism with all antidepressants; none are completely metabolized via non-hepatic mechanisms.      One measure of the extent of metabolism by the liver is to consider the percent excreted unchanged by the kidney. The higher the amount that is excreted unchanged by the kidney, the less is required for the liver to metabolize. Two antidepressants with a relatively high amount excreted unchanged by the kidney are sertraline (40-45% excreted unchanged) and desvenlafaxine (45% excreted unchanged).     Sertraline undergoes extensive first-pass metabolism by the liver; additional metabolism may involve CYP2B6, CYP2C9, CYP2C19, CYP2D6, and CYP3A4. For desvenlafaxine, conjugation is the major pathway, and oxidation via CYP 3A4 is the minor pathway. In contrast, mirtazapine is extensively metabolized by CYP1A2, 2D6, and 3A4 and excreted as metabolites (75% in the urine and 15% in the feces) and not as an unchanged drug.    Dosage adjustments are recommended for many antidepressants for liver dysfunction. The recommended dosage for desvenlafaxine in Child-Pugh Class B or Class C is 50mg  once daily to a maximum dose of 100mg  once daily. Sertraline is not recommended for  patients with Child-Pugh Class C.  For patients with Child-Pugh Class A or B, no dosage adjustment is necessary for sertraline doses </= 100 mg daily. For patients taking > 100mg  daily, a 50% dose reduction is recommended. There are no dosage guidelines for hepatic impairment in the package insert for mirtazapine; however, caution is recommended when using mirtazapine in this setting. Some experts recommend reducing the initial dose by 50% and a maximum dose of 30 mg/day in patients with hepatic impairment.     Most reviews of antidepressants in liver disease are in the context of cirrhosis and not past drug-induced hepatotoxicity.  Chronic liver diseases are associated with variable and non-uniform reductions in drug-metabolizing activities. Cirrhosis causes a decline in the first-pass effect (pre-systemic elimination) and increases the extent of systemic absorption. The activity of the various cytochrome P450 enzyme CYP enzymes varies in patients with cirrhosis.    A review from 2014 [facility] et Nelida Gores Pharmacol Ther 1610; [phone]) identifies SSRIs and SNRIs as effective and generally safe in patients with chronic liver disease and those who have had orthotopic liver transplants; however, desvenlafaxine is not included in this review.     In a second review Hubbard Robinson al, Clin Pharmacokinet 2014) [phone]), the AUC and clearance of desvenlafaxine are reported to be minimally affected by hepatic impairment (insignificant change) compared to other SSRIs and  SNRIs. It should be noted that SSRIs and SNRIs have been associated with hepatotoxicity in isolated reports in patients without chronic liver disease; this typically reverses on discontinuation of the drug. Furthermore, SSRIs may be associated with bleeding which may be an additional risk for patients with liver disease.     References (available as a pdf via email; please advise if you would like a copy)    [facility], Erle Crocker MS, Thursz MR & Dhar A.  Review article: depression and the use of antidepressants in patients with chronic liver disease or liver transplantation. Aliment Pharmacol Ther 2014; 40: [id number].     Mauri MC, Fiorentini A, Murdock, Altamura AC. Pharmacokinetics of Antidepressants in Patients with Hepatic Impairment. Clin Pharmacokinet 2014; [phone]    Lexi-Comp:  drug monographs for citalopram, escitalopram, mirtazapine, sertraline, venlafaxine    Charyl Bigger, PharmD    I spent >30 minutes reviewing and completing this eConsult.      This eConsult is based on the clinical data available to me and is furnished without benefit of a comprehensive evaluation or physical examination.  The above will need to be interpreted in light of any clinical issues, or changes in patient status, not available to me at the time of filing this eConsult.  Please alert me if you have further questions.

## 2023-05-26 LAB — KAPPA AND LAMBDA FREE LIGHT CHAINS, SERUM
Kappa Light Chain, Serum, Free: 141.3 mg/L — ABNORMAL HIGH (ref 3.3–19.4)
Kappa/Lambda Ratio, Serum, Free: 12.73 — ABNORMAL HIGH (ref 0.26–1.65)
Lambda Light Chain, Serum, Free: 11.1 mg/L (ref 5.7–26.3)

## 2023-05-26 LAB — COMPREHENSIVE METABOLIC PANEL (BMP, AST, ALT, T.BILI, ALKP, TP ALB)
AST: 27 IU/L (ref 0–40)
Alanine transaminase: 22 IU/L (ref 0–32)
Albumin, Serum / Plasma: 4.2 g/dL (ref 3.9–4.9)
Alkaline Phosphatase: 131 IU/L — ABNORMAL HIGH (ref 44–121)
BUN/Creatinine Ratio (External Lab): 27 (ref 12–28)
Bilirubin, Total: 0.7 mg/dL (ref 0.0–1.2)
Calcium, total, Serum / Plasma: 9.3 mg/dL (ref 8.7–10.3)
Carbon Dioxide, Total: 22 mmol/L (ref 20–29)
Chloride, Serum / Plasma: 110 mmol/L — ABNORMAL HIGH (ref 96–106)
Creatinine: 0.63 mg/dL (ref 0.57–1.00)
Globulin, Total: 2.9 g/dL (ref 1.5–4.5)
Glucose, non-fasting: 86 mg/dL (ref 70–99)
Potassium, Serum / Plasma: 4.2 mmol/L (ref 3.5–5.2)
Protein, Total, Serum / Plasma: 7.1 g/dL (ref 6.0–8.5)
Sodium, Serum / Plasma: 142 mmol/L (ref 134–144)
Urea Nitrogen, serum/plasma: 17 mg/dL (ref 8–27)
eGFRcr: 100 mL/min/{1.73_m2} (ref >59)

## 2023-05-26 LAB — IMMUNOFIXATION ELECTROPHORESIS, SERUM
IgA, Serum: 60 mg/dL — ABNORMAL LOW (ref 87–352)
IgG: 1782 mg/dL — ABNORMAL HIGH (ref 586–1602)
IgM, Serum: 50 mg/dL (ref 26–217)

## 2023-05-26 LAB — PROTEIN ELECTROPHORESIS, SERUM
A/G Ratio: 0.8 (ref 0.7–1.7)
Albumin, sPEP: 3.2 g/dL (ref 2.9–4.4)
Alpha 1 Globulin: 0.3 g/dL (ref 0.0–0.4)
Alpha 2 Globulin: 0.7 g/dL (ref 0.4–1.0)
Beta Globulin: 1.2 g/dL (ref 0.7–1.3)
Gamma Globulin: 1.8 g/dL (ref 0.4–1.8)
Globulin, Total: 3.9 g/dL (ref 2.2–3.9)
M-Protein (monoclonal), Serum: 1.4 g/dL — ABNORMAL HIGH
Protein, Total, Serum / Plasma: 7.1 g/dL (ref 6.0–8.5)

## 2023-05-26 LAB — COMPLETE BLOOD COUNT WITH DIFFERENTIAL
% Basophils: 1 %
% Eosinophils: 3 %
% Immature Granulocytes: 1 %
% Lymphocytes: 47 %
% Monocytes: 11 %
% Neutrophils: 37 %
Abs Basophils: 0 10*3/uL (ref 0.0–0.2)
Abs Eosinophils: 0.1 10*3/uL (ref 0.0–0.4)
Abs Lymphocytes: 2 10*3/uL (ref 0.7–3.1)
Abs Monocytes: 0.5 10*3/uL (ref 0.1–0.9)
Abs Neutrophils: 1.6 10*3/uL (ref 1.4–7.0)
Hematocrit: 36 % (ref 34.0–46.6)
Hemoglobin: 11.6 g/dL (ref 11.1–15.9)
MCH: 29.4 pg (ref 26.6–33.0)
MCHC: 32.2 g/dL (ref 31.5–35.7)
MCV: 91 fL (ref 79–97)
Platelet Count: 139 10*3/uL — ABNORMAL LOW (ref 150–450)
RBC Count: 3.95 x10E6/uL (ref 3.77–5.28)
RDW-CV: 16.3 % — ABNORMAL HIGH (ref 11.7–15.4)
WBC Count: 4.3 10*3/uL (ref 3.4–10.8)

## 2023-05-26 LAB — LACTATE DEHYDROGENASE, BLOOD: LDH, serum/plasma: 195 IU/L (ref 119–226)

## 2023-05-27 ENCOUNTER — Telehealth: Payer: PRIVATE HEALTH INSURANCE | Attending: Nurse Practitioner

## 2023-05-27 DIAGNOSIS — C9000 Multiple myeloma not having achieved remission: Secondary | ICD-10-CM

## 2023-05-27 NOTE — Progress Notes (Unsigned)
Addendum #2: Plan to start with Velcade/Pom/Methylpred per [employer], a randomized, open label, phase 3 trial, which showed improved PFS in patients who were treated with VPd vs Pd alone .  We are substituting dex with Methylpred due to history of anxiety, fatigue and insomnia with dex.    Plan to give:  Velcade 1mg  SQ once weekly for 3 out of 4 weeks  Pomalidomide 2mg  PO daily for 21 of 28 days  Methylpred 16mg  twice weekly on Mondays and Thursdays    Ariel Braun  [id number]    Date of Service: 05/27/2023    Reason for visit / Chief Complaints:    Subjective   MADALEINE Braun is a 62 y.o. female with MM who is on VIDEO follow-up    Events since the last visit: The patient has been OFF therapy. She had a prolonged hospitalization in early 2023 for ~ 5 months (CMV colitis, norovirus infection (chronic), FTT). Then readmitted ~6/8-7/13/23 - and transitioned to SNF for an extended period of time after discharge. Off feeding tube since 06/2022, taking all pills orally since 07/2022. Has been home since Nov 2023.     Overall doing better. Feeling stronger, working on going up and down stairs.  Legs better but not at 100%, no falls. No fevers or infectious symptoms. Appetite good, does not feel like she needs remeron anymore for appetite but want to make sure her anxiety is managed.      No new bone pain or signs of progressive MM. No kidney/urinary difficuties. No f/chills, URI symptoms, sinus congestion, sore throat or other infectious symptoms. No N/V/dysuria/diarrhea.  No CP, palp.  No bleeding or bruising.    Continues to deal with mental stressors. Has been taking care of her son who is ill and eventually will need brain surgery - scheduled for 06/14/23. Her husband has diabetes.  Daughter, [name], who has been her main caregiver, has been sick with GI symptoms in the past year, diagnosed with [facility] and responded poorly with Cipro, fainting spells. Daughter has been unable to work for the past  year.    Dr. Ardeth Sportsman - [facility]  Oncology history:  DX:   IgG kappa myeloma, diagnosed 09/07/10   - BMBx: 8% plasma cells, normal cyto 46XX, FISH showed only 3 copies 1q21, Stage 2 ISS (3.88 beta 2 microglobulin),IgG 4750, M-spike 3.2, kappa 586.9 mg/L.     TREATMENT  1. LINE1: RVD 10/2010 through April 2011 (? 4 cycles). Achieved VGPR with M-spike 0.4, IgG 886, kappa 27.6.   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 M-spike 0.6, IgG 1270.     3. LINE2: 08/05/14: Started Rev 15 mg 3/4. M-spike 1, IgG 1640, kappa 62.2 - SD    Biochemical PD 04/17/19: M-1.4, KLC 96.3. ? PD. Up Rev to 15 mg qd (3/4) and dex 8 mg wkly(4/4).       07/20/19: L hip pain. M-1.2, KLC 96. Cont. Rev 15 mg (3/4)/dex 8 mg (4/4).     4. LINE4: 12/2019: DaraPomDex.       Past Medical History:   Diagnosis Date    Acid reflux disease     Anxiety     Depression     Hereditary angioedema (CMS code)     Multiple myeloma, without mention of having achieved remission     IgG kappa    Myeloma (CMS code)  05/10/2011     Past medical, family and social histories, as well as medications and allergies, were reviewed and updated in the medical record as appropriate.    Current Outpatient Medications   Medication Sig Dispense Refill    acyclovir (ZOVIRAX) 400 mg tablet Take 1 tablet (400 mg total) by mouth in the morning and 1 tablet (400 mg total) in the evening. 180 tablet 3    apixaban (ELIQUIS) 5 mg tablet Take 1 tablet (5 mg total) by mouth 2 (two) times daily      cholecalciferol, vitamin D3, 1000 UNITS tablet 2 tablets (2,000 Units total) by Feeding Tube route daily (Patient not taking: Reported on 05/01/2023)      cyanocobalamin, Vitamin B12, (VITAMIN B-12) 1,000 mcg tablet Take 1 tablet (1,000 mcg total) by mouth daily      desvenlafaxine succinate (PRISTIQ) 25 mg 24 hr tablet Take 1 tablet (25 mg total) by mouth every morning 30 tablet 1    folic acid (FOLVITE) 1 mg tablet 1 tablet  (1 mg total) by Feeding Tube route daily (Patient taking differently: Take 1 tablet (1 mg total) by mouth daily)      ixazomib 3 mg CAP Take 3 mg by mouth every 7 (seven) days Take 3mg  on Day1, 8, 15 3 capsule 0    methylPREDNISolone (MEDROL) 8 mg tablet Take 2 tablets (16 mg total) by mouth 2 (two) times a week Take 16mg  on Monday and Thursdays 16 tablet 0    mirtazapine (REMERON) 30 mg tablet 1 tablet (30 mg total) by Per G Tube route nightly at bedtime (Patient taking differently: Take 1 tablet (30 mg total) by mouth nightly at bedtime)      multivitamin with iron-minerals-folic acid (THERA M PLUS) 9 mg iron-400 mcg tablet 1 tablet by Feeding Tube route daily      ondansetron (ZOFRAN) 8 mg tablet 1 tablet (8 mg total) by Feeding Tube route in the morning and 1 tablet (8 mg total) at noon and 1 tablet (8 mg total) in the evening. (Patient not taking: Reported on 09/09/2022)      pomalidomide 2 mg CAP Take 1 capsule (2 mg total) by mouth nightly at bedtime TAKE ONE CAPSULE BY MOUTH FOR 21 DAYS FOLLOWED BY 7 DAYS OFF 21 capsule 0    prochlorperazine (COMPAZINE) 5 mg tablet 1 tablet (5 mg total) by Feeding Tube route every 8 (eight) hours as needed for Nausea (Patient not taking: Reported on 09/09/2022)      therapeutic multivitamin (THERAGRAN) tablet Take 1 tablet by mouth daily      thiamine mononitrate, vit B1, 100 mg tablet 1 tablet (100 mg total) by Feeding Tube route daily (Patient taking differently: Take 1 tablet (100 mg total) by mouth daily)       No current facility-administered medications for this visit.       ALLERGIES  Allergies/Contraindications   Allergen Reactions    Diphenoxylate-Atropine Abdominal Pain     Severe stomach pain     Caffeine Anxiety     "Jitters"       PHYSICAL EXAM- LIMIT EXAM BY VIDEO  Objective: ECOG1  Constitutional:  Pleasant over video  HENT:  Normocephalic, Neck- Supple.   SKIN: No rash, noted Eyes:  PERRL, EOMI, sclera appear clear  Respiratory:  Chest excursions good  Musculoskeletal: Moving all extremities.   Neurologic:  Alert & oriented x 3, nl affect, No focal deficits noted.       RESULTS: Labs in [facility]  were reviewed. Lab results reviewed and interpretation provided to patient  Lab Results   Component Value Date    WBC Count 4.3 05/25/2023    Hemoglobin 11.6 05/25/2023    Hematocrit 36.0 05/25/2023    MCV 91 05/25/2023    Platelet Count 139 (L) 05/25/2023     Lab Results   Component Value Date    Creatinine 0.63 05/25/2023    Creatinine Whole Blood 0.8 06/05/2014         Outside Labs:    No results found.  ASSESSMENT & PLAN  1. Multiple Myeloma: Impression: established - stable - OFF therapy - requiring disease monitoring  This patient is off therapy and at high-risk for progression and requiring intensive monitoring. The patient's last therapy has been DPd - this was toxic in that she had infectious comlications. She now has progression.      We reviewed the labs from 04/27/23: SIFE, SPEP, SFLC, CBC => Hgb 12, m-prot pending (last 1), KLC 105.2 (last 95.5), LLC 10.8, K/L ratio 9.74 (last 7.46), Hgb 10.9, Cr 0.63, Ca 10, Alb 4.2.     PETCT 01/2023 - No new hypermetabolic disease.    We discussed that she can consider a low-dose regimen or could continue to wait and watch. The regimen that Dr. Daphine Deutscher recommended and discussed with patient previously was: Ixaz/cyt/medrol as follows:    IxazCytMedrol  - Ixazomib 2 mg Day 1, 8, 15  - Cytoxan 300 mg Day 1, 8, 15  - Medrol 8 mg Day 1, 8, 15    At 03/25/23 visit, patient expressed concerns for Cytoxan and the potential side effects. She prefers Pom.      She is ready to restart myeloma therapy and wants to consider Pom in lieu of Cytoxan.    Addendum #1:  Discussed with Dr. Daphine Deutscher.  He prescribed:  Ixazomib 3mg  PO on D1, 8, 15 Q28D  Pom 2mg  daily for 21 of 28 days  Methylpred 16mg  twice weekly on M/Th    Supportive care:  Acyclovir  Continue Eliquis for dvt ppx if on imid  To prevent neuropathy  - Vit B6 50 mg  - Alpha-lipoic acid 200  mg daily  - L-Carnitine 500 mg daily  - Zofran as needed for nausea    Follow the below labs after starting therapy, otherwise labs monthly/.  Weekly blood tests - CBC, CMV pcr  Every 2 weeks - CMP  Every 4 weeks - Myeloma labs      The patient has a high complexity problem: multiple myeloma, which is an incurable malignancy and the patient is receiving active monitorring, and can have relapse which poses a threat to life and bodily function.    Regarding data complexity, I have reviewed the following tests => SPEP, SIFE, SFLCs, CBC and CMP.      Medical Decision Making/ high-risk  Plan:   - Continue OFF therapy or consider starting Ixaz/Cyt/medrol.  - Continue to follow myeloma protein levels every 4-6 weeks  - Follow-up in 3 months     2. Immunocompromised state/Infectious Disease concerns: Impression: established - immunocompromised from disease/treatment:  The patient is immunocompromised and is at high-risk from infection due to therapy. Has been avoiding Covid exposure. Following guidelines.    Requiring close follow-up for symptoms.  At risk for VZV reactivation and is on ACV. No current signs of infection. Will call with new signs of infection        Plan: - Continue prophylaxis - ACV  -  Follow CMV pcr  - yearly flu vaccine  - Needs to shelter from Covid exposure  - The patient will call with any symptoms or signs of infection    3. Depression/Psychologic/Social Stressors: Impression: established - stable  The patient has cancer and has situation depression and is at risk for anxiety. Needs close follow-up    Currently, the attitude is good. There are no signs of anxiety or active depression. Mood appropriate, understands plan      Plan: No additional medications needed    4. Anemia: Impression: established - stable    The blood counts are stable, Hgb 10.9. Monitor labs monthly.      Plan: No transfusions or cytokines needed today.    5. Neutropenia/Thrombocytopenia: Impression: established - stable    The  disease and prior treatment has toxicity on the bone marrow - no current signs of suppression    Plan: No cytokines or transfusions    6. RI/ Electrolyte Replacement: Impression: established - stable     Taking ample fluids and avoiding nephrotoxins. Lytes & Cr Okay, appears euvolemic. Appetite good and weight unchanged. No new medications needed.     Plan: Continue adequate fluid intake & balanced, nutritional diet   - Continue as much exercise as possible.  - No additional supplementation needed.    7. Osteopenia: Impression: established - stable:    The patient is at risk for fracture due to MM. We recommend Calcium and Vit D replacement. We also recommend an aggressive exercise program.    Plan: - Consider Biphosphonates    8. Secondary Cancer Risk: impression: established - NEEDS close follow-up  The patient has high-risk of secondary malignancies. Needs close follow-up and routine cancer screening.    Plan: - Derm follow-up yearly  - Colonoscopy every 5 years  - Screening per PCP.     Follow up: 1 month with Harriett Sine,  2 months with Dr. Moshe Salisbury NP  Hematology, Blood & Marrow Transplant, and Cellular Therapy  [place] [facility]  office [phone]  fax [phone]  05/27/23     I, Mills Koller, NP, spent a total of 40 minutes on this patient's care on the day of their visit excluding time spent related to any billed procedures. This time includes time spent with the patient as well as time spent documenting in the medical record, reviewing patient's records and tests, obtaining history, placing orders, communicating with other healthcare professionals, counseling the patient, family, or caregiver, and/or care coordination for the diagnoses above.

## 2023-05-27 NOTE — Patient Instructions (Signed)
We will work on appealing Ixazomib, Pom and methylpred (or dex)    Follow up with Harriett Sine in 4 weeks

## 2023-05-28 LAB — CYTOMEGALOVIRUS DNA, QUANTITATIVE PCR, PLASMA: Cytomegalovirus DNA, Quantitative PCR: NEGATIVE IU/mL

## 2023-06-06 MED ORDER — POMALIDOMIDE 2 MG CAPSULE: 2 mg | ORAL | 0 refills | Status: DC

## 2023-06-07 ENCOUNTER — Telehealth: Admit: 2023-06-08 | Payer: PRIVATE HEALTH INSURANCE | Attending: Transplant Hepatology

## 2023-06-07 DIAGNOSIS — K7460 Unspecified cirrhosis of liver: Secondary | ICD-10-CM

## 2023-06-07 MED ORDER — IXAZOMIB 3 MG CAPSULE
3 | ORAL_CAPSULE | ORAL | 0 refills | Status: DC
Start: 2023-06-07 — End: 2023-06-08

## 2023-06-07 MED ORDER — DEXAMETHASONE 4 MG TABLET
4 | ORAL_TABLET | ORAL | 0 refills | Status: DC
Start: 2023-06-07 — End: 2023-06-08

## 2023-06-07 NOTE — Progress Notes (Signed)
Ariel Braun is a 62 y.o. female seen for an follow-up consultation at the request of Dr. Samuella Cota for chronic liver disease restarting myeloma therapy.    I performed this evaluation using real-time telehealth tools, including a live video Zoom connection between my location and the patient's location. Prior to initiating, the patient consented to perform this evaluation using telehealth tools.    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 In 05/2022 during a hospitalization she had ongoing ascites requiring weekly LVP in the setting of protein caloric malnutrition s/p PEG-dependent on tube feeds. Hepatology was consulted for further management of her ascites, which was believed to be multifactorial but more driven by her malnutrition than her portal hypertension.     To review, history is notable for severe diarrhea secondary to norovirus treated with nitazoxanide x 2) complicated by hypovolemic shock and acute liver injury in 12/02/2021, transferred to [facility] with prolonged hospital course (1/25-01/15/2022). Her transjugular liver biopsy on 12/11/21 showed mild HVPG of 8 mmHg and steatohepatitis with pericentral/sinusoidal and periportal fibrosis, along with ductal reaction with focal pericholangitis, suggestive of drug-induced liver injury in the setting of underlying NASH with possible culprit including thalidomide derivatives (pomalidomide) and dexamethasone (which can exacerbation of pre-existing NASH). Flexible sigmoidoscopy on 12/17/2021 showed CMV colitis and was treated with  ganciclovir followed by letemovir due to bone marrow suppression. 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 Her ascites fluid studies was notable for high SAAG >1.1 and low protein <1.5 suggestive of portal hypertension (mild elevated HVPG of 8 mmHg [WHVP 16 mmHg - FHVP 8 mmHg]on 12/11/21). She has no history of SBP. Her imaging with CTAP on 12/14/21 showed marked steatosis along with US doppler on 12/11/21 showing steatosis with portal vein diameter of 15 mm. US abdomen on 12/30/21 showed steatosis with normal surface contour. [place] with doppler on 05/16/22 showed mild liver surface nodularity compatible with early cirrhosis with sequela of portal hypertension including moderate ascites and splenomegaly.  She was discharged to a SNF for an extended period of time.    She has been off tube feeds since 06/2022 and has been at home since 09/2022, no longer needing home services. Her diarrhea has completely resolved. Her ascites and edema have resolved. She stopped rifaximin and ursodiol earlier in April.     Interval History: She was initially seen in hepatology clinic in 02/2023. She has not yet started chemotherapy, as she is awaiting insurance authorization. She has a lot of situational stressors and has worries about potential effects of the mirtazepine on her WBC and her liver. Was recently prescribed desvenlafaxine but has concerns about this as well.      Past Medical History:   Diagnosis Date    Acid reflux disease     Anxiety     Depression     Hereditary angioedema (CMS code)     Multiple myeloma, without mention of having achieved remission  IgG kappa    Myeloma (CMS code) 05/10/2011       Past Surgical History:   Procedure Laterality Date    AUTOLOGOUS STEM CELL TRANSPLANTATION  05/12/11    BREAST CYST  EXCISION  1981       Current Outpatient Medications   Medication Sig Dispense Refill    acyclovir (ZOVIRAX) 400 mg tablet Take 1 tablet (400 mg total) by mouth in the morning and 1 tablet (400 mg total) in the evening. 180 tablet 3    apixaban (ELIQUIS) 5 mg tablet Take 1 tablet (5 mg total) by mouth 2 (two) times daily      cholecalciferol, vitamin D3, 1000 UNITS tablet 2 tablets (2,000 Units total) by Feeding Tube route daily (Patient not taking: Reported on 05/01/2023)      cyanocobalamin, Vitamin B12, (VITAMIN B-12) 1,000 mcg tablet Take 1 tablet (1,000 mcg total) by mouth daily      desvenlafaxine succinate (PRISTIQ) 25 mg 24 hr tablet Take 1 tablet (25 mg total) by mouth every morning 30 tablet 1    folic acid (FOLVITE) 1 mg tablet 1 tablet (1 mg total) by Feeding Tube route daily (Patient taking differently: Take 1 tablet (1 mg total) by mouth daily)      ixazomib 3 mg CAP Take 3 mg by mouth every 7 (seven) days Take 3mg  on Day1, 8, 15 3 capsule 0    mirtazapine (REMERON) 30 mg tablet 1 tablet (30 mg total) by Per G Tube route nightly at bedtime (Patient taking differently: Take 1 tablet (30 mg total) by mouth nightly at bedtime)      multivitamin with iron-minerals-folic acid (THERA M PLUS) 9 mg iron-400 mcg tablet 1 tablet by Feeding Tube route daily      ondansetron (ZOFRAN) 8 mg tablet 1 tablet (8 mg total) by Feeding Tube route in the morning and 1 tablet (8 mg total) at noon and 1 tablet (8 mg total) in the evening. (Patient not taking: Reported on 09/09/2022)      pomalidomide 2 mg CAP Take 1 capsule (2 mg total) by mouth nightly at bedtime TAKE ONE CAPSULE BY MOUTH FOR 21 DAYS FOLLOWED BY 7 DAYS OFF 21 capsule 0    prochlorperazine (COMPAZINE) 5 mg tablet 1 tablet (5 mg total) by Feeding Tube route every 8 (eight) hours as needed for Nausea (Patient not taking: Reported on 09/09/2022)      therapeutic multivitamin (THERAGRAN) tablet Take 1 tablet by mouth daily      thiamine mononitrate, vit B1, 100 mg  tablet 1 tablet (100 mg total) by Feeding Tube route daily (Patient taking differently: Take 1 tablet (100 mg total) by mouth daily)       No current facility-administered medications for this visit.       Allergies: She is allergic to diphenoxylate-atropine and caffeine.    Social History:   Social History     Socioeconomic History    Marital status: Married     Spouse name: Not on file    Number of children: Not on file    Years of education: Not on file    Highest education level: Not on file   Occupational History    Not on file   Tobacco Use    Smoking status: Unknown    Smokeless tobacco: Not on file   Substance and Sexual Activity    Alcohol use: Never    Drug use: Never    Sexual activity: Not Currently  Other Topics Concern    Not on file   Social History Narrative    Not on file     Social Determinants of Health     Financial Resource Strain: Low Risk  (02/28/2023)    Overall Financial Resource Strain (CARDIA)     Difficulty of Paying Living Expenses: Not hard at all   Food Insecurity: No Food Insecurity (02/28/2023)    Hunger Vital Sign     Worried About Running Out of Food in the Last Year: Never true     Ran Out of Food in the Last Year: Never true   Transportation Needs: No Transportation Needs (02/28/2023)    PRAPARE - Therapist, art (Medical): No     Lack of Transportation (Non-Medical): No   Housing Stability: Low Risk  (02/28/2023)    Housing Stability Vital Sign     Unable to Pay for Housing in the Last Year: No     Number of Places Lived in the Last Year: 1     Unstable Housing in the Last Year: No       Family History: Her family history includes Colon cancer in her father; Diabetes in her mother; Hypertension in her brother and mother.  Family history is otherwise negative or as noted above.     Physical Exam:  Vital Signs: There were no vitals taken for this visit.  Constitutional: Patient appears well-developed and well-nourished. Pleasant and appropriately  interactive.   Eyes: Anicteric sclera  Pulmonary/Chest: Effort normal. No respiratory distress. No cough.  Neurological: Alert and oriented to person, place, and time.  Skin: No rash.     Records Review: I have personally reviewed the patient's medical records. These are summarized within the history of present illness.    Labs: I have personally reviewed and interpreted the following laboratory studies:    CIRRHOSIS MANAGEMENT          Latest Ref Rng & Units 05/26/2022    05:48 05/27/2022    05:41 11/03/2022    10:39 12/03/2022    09:03 12/29/2022    08:20 01/26/2023    10:06 03/07/2023 04/01/2023    09:35   Labs   WBC Count 3.4 - 10.8 x10E3/uL 5.7  5.0  4.0  6.4  4.8  5.0  4.3  4.7    RBC Count 3.77 - 5.28 x10E6/uL 2.65  2.79  3.31  4.07  3.42  3.78  3.76  3.98    Hemoglobin 11.1 - 15.9 g/dL 8.7  8.5  16.1  09.6  04.5  11.4  10.9  11.4    Hematocrit 34.0 - 46.6 % 26.7  28.3  31.5  37.8  32.5  34.6  34.9  36.7    MCV 79 - 97 fL 101  101  95  93  95  92  93  92    MCH 26.6 - 33.0 pg 32.8  30.5  30.2  29.5  29.5  30.2  29.0  28.6    MCHC 31.5 - 35.7 g/dL 40.9  81.1  91.4  78.2  31.1  32.9  31.2  31.1    Platelet Count 150 - 450 x10E3/uL 136  130  196  200  154  154  155  157    PT 9.1 - 12.0 sec       10.7     INR 0.9 - 1.2       1.0     Sodium 134 -  144 mmol/L 131  132  141  140  137  145   141  143    Creatinine 0.57 - 1.00 mg/dL 6.96  2.95  2.84  1.32  0.63  0.65   0.53  0.62    Bilirubin, Total 0.0 - 1.2 mg/dL 0.4  0.4  0.4  0.7  0.6  0.7   0.4  0.8    Albumin, Serum / Plasma 3.9 - 4.9 g/dL  2.0  3.5  4.2  4.0  4.2   4.0  4.4    MELD-Na PELD        6    MELD 3.0        7          04/27/2023    09:48 05/25/2023    10:23   Labs   WBC Count 4.5  4.3    RBC Count 4.12  3.95    Hemoglobin 12.0  11.6    Hematocrit 37.3  36.0    MCV 91  91    MCH 29.1  29.4    MCHC 32.2  32.2    Platelet Count 141  139    PT     INR     Sodium 142  142    Creatinine 0.63  0.63    Bilirubin, Total 1.0  0.7    Albumin, Serum / Plasma 4.2  4.2     MELD-Na PELD     MELD 3.0        Details          More values are hidden. Newest values shown. Go to activity for more data.             .AMB SYNOPSIS NAFLD MANAGEMENT          Latest Ref Rng & Units 05/26/2022    05:48 05/27/2022    05:41 11/03/2022    10:39 12/03/2022    09:03 12/29/2022    08:20 01/26/2023    10:06 03/07/2023 04/01/2023    09:35   Labs   WBC Count 3.4 - 10.8 x10E3/uL 5.7  5.0  4.0  6.4  4.8  5.0  4.3  4.7    RBC Count 3.77 - 5.28 x10E6/uL 2.65  2.79  3.31  4.07  3.42  3.78  3.76  3.98    Hemoglobin 11.1 - 15.9 g/dL 8.7  8.5  44.0  10.2  72.5  11.4  10.9  11.4    Hematocrit 34.0 - 46.6 % 26.7  28.3  31.5  37.8  32.5  34.6  34.9  36.7    MCV 79 - 97 fL 101  101  95  93  95  92  93  92    MCH 26.6 - 33.0 pg 32.8  30.5  30.2  29.5  29.5  30.2  29.0  28.6    MCHC 31.5 - 35.7 g/dL 36.6  44.0  34.7  42.5  31.1  32.9  31.2  31.1    Platelet Count 150 - 450 x10E3/uL 136  130  196  200  154  154  155  157    PT 9.1 - 12.0 sec       10.7     INR 0.9 - 1.2       1.0     Creatinine 0.57 - 1.00 mg/dL 9.56  3.87  5.64  3.32  0.63  0.65   0.53  0.62    AST 0 - 40  IU/L 49  50  24  23  20  23   20  23     ALT 0 - 32 IU/L 21  20  12  13  11  14   14  15     Bilirubin, Total 0.0 - 1.2 mg/dL 0.4  0.4  0.4  0.7  0.6  0.7   0.4  0.8    Alkaline Phosphatase 44 - 121 IU/L 200  179  121  133  114  120   125  126    Albumin, Serum / Plasma 3.9 - 4.9 g/dL  2.0  3.5  4.2  4.0  4.2   4.0  4.4          04/27/2023    09:48 05/25/2023    10:23   Labs   WBC Count 4.5  4.3    RBC Count 4.12  3.95    Hemoglobin 12.0  11.6    Hematocrit 37.3  36.0    MCV 91  91    MCH 29.1  29.4    MCHC 32.2  32.2    Platelet Count 141  139    PT     INR     Creatinine 0.63  0.63    AST 24  27    ALT 16  22    Bilirubin, Total 1.0  0.7    Alkaline Phosphatase 121  131    Albumin, Serum / Plasma 4.2  4.2       Details          More abnormal values are hidden. Newest values shown. Go to activity for more data.    More values are hidden. Newest values shown. Go  to activity for more data.             Serologies:  Hep A IgG reactive (12/11/2021)  HBsAg negative (12/11/2021)  Anti-HBs positive (12/11/2021)  Anti-HBc negative (12/11/2021)  HIV negative (12/15/2021)  HCV Ab negative (02/12/2011), negative (12/11/2021)    Studies: I have reviewed the following studies:    PET CT 01/18/2023: Impression:  -Stable appearance of mixed lytic and sclerotic lesions within the thoracolumbar spine and pelvis. No evidence for interval hypermetabolic skeletal pathology  -Stable nodular thyroid with interval decrease in metabolic activity  -Inflammatory changes within the lower lobes of both lungs     05/16/2022: IMPRESSION:   1.  Mild liver surface nodularity compatible with early cirrhosis and/or fibrosis. Patent vasculature with normal direction of flow.  2.  Sequela of portal hypertension including increased now moderate ascites and splenomegaly.  3.  Cholelithiasis.    Liver biopsy 12/11/2021: FINAL PATHOLOGIC DIAGNOSIS   Liver, transjugular biopsy:  1.  Steatohepatitis with pericentral/sinusoidal and periportal fibrosis; see comment.  2.  Ductular reaction with focal pericholangitis; see comment.  COMMENT:  The biopsy shows severe steatosis with pericentral/sinusoidal and  periportal fibrosis, histiocytic and neutrophilic lobular inflammation,  Mallory hyaline as well as rare ballooned hepatocytes. 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  Clinical correlation with steatohepatitis risk factors is suggested,  including for any history of massive weight loss which may lead to an  aggressive onset of steatohepatitis [1].  The history of dexamethasone  use is noted; corticosteroid use has been associated with exacerbation  of pre-existing fatty liver disease [2].     Additionally, there is ductular reaction with focal pericholangitis,  raising consideration for a duct obstructive process. Clinical  correlation is advised.  The concern for other  drug-induced liver injury  is noted. In particular, thalidomide derivatives (such as pomalidomide)  have been reported in association with vanishing bile duct syndrome  (VBDS) [3]; however, there is no convincing duct loss to suggest VBDS.  There are no clusters of plasma cells seen to suggest involvement by  myeloma.    Microscopic findings  Adequacy: 9 portal tracts.   Portal tracts: Mild portal inflammation with histiocytes and lymphocytes, no interface activity, mild ductular reaction with focal pericholangitis. Interlobular bile ducts show mild duct injury without  overt duct loss.   Hepatic parenchyma: Mild lobular inflammation with histiocytes and neutrophils, rare spotty hepatocellular injury/necrosis, severe steatosis with neutrophilic satellitosis (grade 3, scale 0-3), rare  hepatocellular ballooning, extensive Mallory hyaline deposition, rare cholestasis, no granulomas, no viropathic changes, and no sinusoidal changes.    Immunohistochemical and histochemical stains (block A1):  - Trichrome:      Highlights mild to moderate pericellular/sinusoidal fibrosis and periportal fibrosis (stage 2, scale 0-4,NASH-CRN method).  - Iron:  1+ focal staining in scattered Kupffer cells.  - PASD:  No cytoplasmic globules typical of alpha-1-antitrypsin deficiency.  - CK8/18: Highlights Mallory hyaline within hepatocytes.  - CK7: Highlights mild ductular reaction and intact interlobular bile ducts. No periportal hepatocyte staining to support chronic cholestatic effect.  - CD68: Highlights scattered portal and lobular macrophages. No staining in hepatocytes with Mallory hyaline (indicates that these cells are not macrophages).  - [place] Red:     Negative, no support for hepatic amyloidosis.     ASSESSMENT AND PLAN:  Ariel Braun is a 62 y.o. 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    Chronic liver disease: Stage 2 fibrosis with Steatohepatitis and Ductular reaction with focal pericholangitis on liver biopsy in 11/2021. Her liver enzymes have normalized and ascites resolved with improvement in nutrition/albumin. Agree with holding the rifaximin and the ursodiol.     Multiple myeloma: Planning to start Ixazomib, Cytoxan, Medrol. Reviewed in [facility] liver tox:  Ixazomib: In large clinical trials of ixazomib combined with lenalidomide and dexamethasone, elevations in serum aminotransferase levels were common, occurring in ~10% of patients. However, values greater than 5 times the upper limit of normal (ULN) were rare, occurring in <1% of recipients. Cases of clinically apparent liver injury including acute liver failure have been reported in patients receiving ixazomib, however in many instances multiple concomitant medications were being taken and the specific role of ixazomib in causing the liver injury was not always clear. The clinical features of cases of liver injury attributed to ixazomib have not been defined in any detail. Hepatotoxicity is listed as a warning in the product label for ixazomib and monitoring of serum enzymes during treatment is recommended.  Cyclophosphamide: Mild and transient elevations in serum aminotransferase levels are found in up to 43% of patients with cancer who are treated with cyclophosphamide. The abnormalities are generally asymptomatic and transient and do not require dose modification. Enzyme elevations are more common with higher doses and with intravenous therapy. In some instances, marked elevations arise warranting dose  modification or discontinuation of cyclophosphamide.  Clinically apparent liver injury from standard doses of cyclophosphamide is uncommon, but several case reports of acute liver injury with jaundice have been published. The onset is within 2 to 8 weeks of starting cyclophosphamide and the pattern of serum enzyme elevations is hepatocellular. Immunoallergic and autoimmune features are uncommon. The injury in most cases is self-limited and resolves within 1 to 3 months of stopping; however, fatal instances have been reported. Recurrence on reexposure has been described.  Medrol: Corticosteroid therapy can cause hepatic steatosis and hepatic enlargement, but this is often not clinically apparent, particularly in adults. This effect can occur quite rapidly and is rapidly reversed with discontinuation. High doses and long term use has been associated with the development or exacerbation of nonalcoholic steatohepatitis with elevations in serum aminotransferase levels and liver histology resembling alcoholic hepatitis with steatosis, chronic inflammation, centrolobular ballooning degeneration and Mallory bodies. However, symptomatic or progressive liver injury from corticosteroid induced steatohepatitis is uncommon. Furthermore, corticosteroids may act to worsen an underlying nonalcoholic fatty liver disease rather than causing the condition de novo. The worsening may be due to direct effects of glucocorticoids on insulin resistance or fatty acid metabolism or may be the result of weight gain which is common with long term corticosteroid therapy. While simple steatosis induced by corticosteroids is rapidly reversible, steatohepatitis can be slow to resolve upon withdrawal of corticosteroids.    Recommend monitoring liver tests while on treatment- ALT, AST, t bili, alk phos, GGT, INR.    Concerns about mirtazepine and desvenlafaxine: Both have low risk of clinically significant liver injury. Mirtazepine is associated with  elevated liver enzymes in ~10% of cases vs 1% with desvenlafaxine, but she has been stable on mirtazepine for several months. Because she also has concerns about other toxicities with mirtazepine, I think it's okay to switch to desvenlafaxine from a liver perspective.     Surveillance for hepatocellular carcinoma: Liver biopsy in 11/2021 showed F2 fibrosis, but imaging in July 2023 concerning for advanced fibrosis (complicated by multifactorial ascites). Discussed with patient pros/cons of monitoring for [facility]- decided to proceed with q6 month surveillance. I have ordered an AFP and abdominal U/S.     Immunity: She is hepatitis A and B immune. Anti-HBc negative.    Follow-up: 3 months    An After Visit Summary was printed and given to the patient. A copy of this consultation letter was faxed/routed to Dr. Samuella Cota.       I spent a total of 40 minutes on this patient's care on the day of their visit excluding time spent related to any billed procedures. This time includes time spent with the patient as well as time spent documenting in the medical record, reviewing patient's records and tests, obtaining history, placing orders, communicating with other healthcare professionals, counseling the patient, family, or caregiver, and/or care coordination for the diagnoses above.

## 2023-06-08 MED ORDER — DEXAMETHASONE 4 MG TABLET
4 mg | ORAL_TABLET | ORAL | 0 refills | Status: DC
Start: 2023-06-08 — End: 2023-08-18

## 2023-06-08 MED ORDER — DEXAMETHASONE 4 MG TABLET
4 | ORAL_TABLET | ORAL | 0 refills | Status: DC
Start: 2023-06-08 — End: 2023-06-08

## 2023-06-08 MED ORDER — IXAZOMIB 3 MG CAPSULE: 3 mg | ORAL | 0 refills | Status: DC

## 2023-06-08 MED ORDER — IXAZOMIB 3 MG CAPSULE
3 | ORAL_CAPSULE | ORAL | 0 refills | Status: DC
Start: 2023-06-08 — End: 2023-06-08

## 2023-06-08 NOTE — Progress Notes (Signed)
Rerouted dexamethasone and ixazomib to contracted/preferred specialty pharmacy - [facility].    Reroute per insurance.

## 2023-06-08 NOTE — Telephone Encounter (Signed)
MEDICATION PRIOR AUTHORIZATION    Rx name, strength, formulation: Ninlaro 3mg  cap  PA Status: Approved 06/08/2023-06/07/2024 (case# [id number])  Submitted on 06/08/2023  Via phone as urgent  Insurance name: [employer]  Member ID: [id number]  QTY and DS: 3/28 days  Pharmacy: [facility]    Note: [facility] will reroute rx and inform patient with update.     Ariel Braun

## 2023-06-20 MED ORDER — DESVENLAFAXINE SUCCINATE ER 25 MG TABLET,EXTENDED RELEASE 24 HR
25 mg | ORAL_TABLET | Freq: Every morning | ORAL | 1 refills | Status: DC
Start: 2023-06-20 — End: 2023-12-21

## 2023-06-28 ENCOUNTER — Telehealth: Admit: 2023-06-29 | Payer: PRIVATE HEALTH INSURANCE

## 2023-06-28 DIAGNOSIS — F419 Anxiety disorder, unspecified: Secondary | ICD-10-CM

## 2023-06-28 NOTE — Progress Notes (Unsigned)
Subjective    Ariel Braun is a 62 y.o. female with multiple myeloma (diagnosis category: Multiple myeloma (including amyloidosis)  metastatic) who is being seen via video visit while patient is at home at the request of Dr. Dolores Lory of (referral category: Oncology/Rad Onc/Onc Surgery) for symptom management.    Medical team: PCP Dr. Jannetta Quint, oncologist Dr. Delaney Meigs.  Hepatologist Dr. Newman Nip.    Primary caregiver(s):  Her daughter  Palliative care team member disciplines: Physician    HPI  Ariel Braun is a 62 y.o. female with anxiety with associated hot flashes and jitteriness as well as constipation.      Today patient/family reports:     Anxiety:  - Continues to have anxiety due to mental stressors as her son has had surgery and her daughter has severe symptoms with her menses and has had frequent ER visits . States that her son's surgery was successful and is thankful to God.  - In the last visit, we suggested that she cross-taper from mirtazapine to Pristiq. She has since been on mirtazapine and Pristiq and has tapered mirtazapine to 7.5 mg QD from 15 mg QD since 08/02. Feels that mirtazapine calms her better but would like to stay on Pristiq for now.  - Recalls an episode where she missed her mirtazapine dose in the morning and had jitteriness the next day which improved after she took mirtazapine.   - Endorses associated jitteriness and hot flashes. Has difficulty coping up with it.    Constipation  - Endorses significant constipation.  - Eats food rich in fiber but is still constipated. States that her current diet regimen worked for her constipation before starting Pristiq. Wonders Pristiq caused the constipation?  - States that she had loose stools last year.  - Takes Metamucil 500 mg with mild benefit.    Cancer-directed treatment:  - Planning on starting Ninlaro/Pom/Dex. Was advised on side effects of peripheral neuropathy and stopping serotonin elevating medications due to possible  interactions.    HCM:  - Takes Eliquis BID  - Was also advised on taking 2 supplements, R-ALA (alpha lipoic acid) and L-carnitine which would help her peripheral neuropathy. Had questions if this would help her with peripheral neuropathy and if R-ALA works better than ALA.    Social history most significant for:  Lives with husband, mom and son, who has intellectual disability.  Isolated.  Cares for son along with help from daughter, whom she is close to.      Fam hx: Strong fam hx of addiction.  Father was alcoholic.  Mom used valium to cope.  Brothers with drug and alcohol addiction.      Patient/Family screened for spiritual care needs: Yes  Patient/Family screened for psychosocial needs: Yes      SMS Well-Being Survey      06/26/2023     9:37 PM   SMS Well-Being Scores   Constipation 8   Anxiety 7   Appetite 0   Feeling of wellbeing 5   Other problem Sometimes feel shakiness (inside feeling not trimmers)  At times feel like it's too much med.   "I feel at peace." A moderate amount   "At times I worry I will be a burden to my family." A moderate amount   How would you rate your overall quality of life? Good   DPOA I have an Advanced Health Care Directive            Objective  Physical Exam:   NAD  Sitting on couch  Normal neck ROM  Normal resp effort    Performance status (by Palliative Performance Scale): 70% - Reduced ambulation, Unable Normal Job/Work, Significant disease, Occasional assistance necessary, Normal or reduced intake, Full conscious level      Review of Prior Testing  I have personally reviewed and interpreted the following studies:  Lab Results   Component Value Date    WBC Count 4.3 05/25/2023    Hemoglobin 11.6 05/25/2023    Hematocrit 36.0 05/25/2023    MCV 91 05/25/2023    Platelet Count 139 (L) 05/25/2023     Lab Results   Component Value Date    Sodium, Serum / Plasma 142 05/25/2023    Potassium, Serum / Plasma 4.2 05/25/2023    Urea Nitrogen, serum/plasma 17 05/25/2023    Creatinine 0.63  05/25/2023     Lab Results   Component Value Date    Thyroid Stimulating Hormone 3.98 12/15/2021     No results found.    #  Cancer stage at diagnosis- see onc note for staging.      Assessment and Plan    Ariel Braun is a 62 y.o. 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      Anxiety: Anxiety related to her health, her son's health, social isolation and the demands in her family dynamic that pressure her to feel she has to care for everyone else.  - Continue mirtazapine 7.5 mg PO QD for 1 week. Taper down mirtazapine to 3.75 mg for 2 weeks. Discontinue completely after that.  - Continue Pristiq 25 mg PO QD.  - Patient notices feeling more jittery and more hot flashes on the Pristiq and we discussed about going back to mirtazapine but she wants to continue with the Pristiq for now. We will be re-addressing about side effects in the next visit.  - Discussed that it may take time to notice the full benefit of Pristiq.  - Supportive listening provided.  Previously discussed:  - She is concerned about risk of liver inflammation from Mirtazapine. She wants to change to a drug that is not hepatically cleared if possible.  - She reports active prayer and meditation practice.  - Not interested in talk therapy or counseling. She has not felt it was a good fit for her in the past.    Constipation: - has significant constipation after starting Pristiq  - Continue Metamucil PRN daily.    Cancer-directed treatment and associated neuropathy: Planning on starting Ninlaro/Pom/Dex. Was advised on side effects of peripheral neuropathy   - We had a discussion about R-ALA vs ALA and L-carnitine for treating peripheral neuropathy. Discussed that R-ALA is more biologically active and it would be more effective than ALA and that the effectiveness of  L-carnitine is not well studied.      Advance Care Planning   - Prognostic awareness: N/A  - Relevant values/priorities: Reduction in anxiety, care for her son.    - Surrogate decision maker: Not addressed  - Resuscitation preferences: Unknown  -  AD: Deferred  - POLST: Deferred    - GOC convo deferred to future visit  - Today I spent 0 minutes with the patient specifically discussing ACP.     Time Spent  I spent a total of 40 minutes on this  patient's care on the day of their visit excluding time spent related to any billed procedures. This time includes time spent with the patient as well as time spent documenting in the medical record, reviewing patient's records and tests, obtaining history, placing orders, communicating with other healthcare professionals, counseling the patient, family, or caregiver, and/or care coordination for the diagnoses above.    I performed this evaluation using real-time telehealth tools, including a live video Zoom connection between my location and the patient's location. Prior to initiating, the patient consented to perform this evaluation using telehealth tools.        Thank you for allowing [place] to participate in the care of your patient. Please feel free to reach out with any questions, concerns or ideas about additional ways we could serve your patient & their family.    Follow-up: September 3rd, 2024 at 12,00 PM.    Marzella Schlein, MD    I, Modesto Charon Georgi Tuel am acting as a scribe for services provided by Marzella Schlein, MD on 06/28/23, 1:36 PM.    The above scribed documentation as annotated by me accurately reflects the services I have provided.   Marzella Schlein, MD  05/19/2023 8:17 PM

## 2023-06-28 NOTE — Addendum Note (Signed)
Addended by: Marzella Schlein on: 07/04/2023 09:08 AM     Modules accepted: Orders

## 2023-06-28 NOTE — Addendum Note (Signed)
Addended by: Marzella Schlein on: 07/04/2023 09:13 AM     Modules accepted: Orders

## 2023-06-29 LAB — COMPLETE BLOOD COUNT WITH DIFFERENTIAL
% Basophils: 1 %
% Eosinophils: 2 %
% Immature Granulocytes: 1 %
% Lymphocytes: 45 %
% Monocytes: 13 %
% Neutrophils: 38 %
Abs Basophils: 0 10*3/uL
Abs Eosinophils: 0.1 10*3/uL
Abs Lymphocytes: 1.9 10*3/uL
Abs Monocytes: 0.6 10*3/uL
Abs Neutrophils: 1.6 10*3/uL
Hematocrit: 36.1 %
Hemoglobin: 12.1 g/dL
MCH: 29.7 pg
MCHC: 33.5 g/dL
MCV: 89 fL
Platelet Count: 140 10*3/uL — ABNORMAL LOW
RBC Count: 4.08 x10E6/uL
RDW-CV: 16 % — ABNORMAL HIGH
WBC Count: 4.2 10*3/uL

## 2023-06-29 LAB — COMPREHENSIVE METABOLIC PANEL (BMP, AST, ALT, T.BILI, ALKP, TP ALB)
AST: 31 IU/L
Alanine transaminase: 25 IU/L
Albumin, Serum / Plasma: 4.2 g/dL
Alkaline Phosphatase: 125 IU/L — ABNORMAL HIGH
BUN/Creatinine Ratio (External Lab): 28
Bilirubin, Total: 0.8 mg/dL
Calcium, total, Serum / Plasma: 9.6 mg/dL
Carbon Dioxide, Total: 20 mmol/L
Chloride, Serum / Plasma: 105 mmol/L
Creatinine: 0.6 mg/dL
Globulin, Total: 2.8 g/dL
Glucose, non-fasting: 85 mg/dL
Potassium, Serum / Plasma: 4.1 mmol/L
Protein, Total, Serum / Plasma: 7 g/dL
Sodium, Serum / Plasma: 141 mmol/L
Urea Nitrogen, serum/plasma: 17 mg/dL
eGFRcr: 101 mL/min/{1.73_m2}

## 2023-06-30 LAB — KAPPA AND LAMBDA FREE LIGHT CHAINS, SERUM
Kappa Light Chain, Serum, Free: 176.6 mg/L — ABNORMAL HIGH
Kappa/Lambda Ratio, Serum, Free: 17.84 — ABNORMAL HIGH
Lambda Light Chain, Serum, Free: 9.9 mg/L

## 2023-06-30 LAB — IMMUNOFIXATION ELECTROPHORESIS, SERUM
IgA, Serum: 62 mg/dL — ABNORMAL LOW
IgG: 1915 mg/dL — ABNORMAL HIGH
IgM, Serum: 47 mg/dL

## 2023-06-30 LAB — PROTEIN ELECTROPHORESIS, SERUM
A/G Ratio: 0.9
Albumin, sPEP: 3.5 g/dL
Alpha 1 Globulin: 0.3 g/dL
Alpha 2 Globulin: 0.7 g/dL
Beta Globulin: 1.2 g/dL
Gamma Globulin: 1.8 g/dL
Globulin, Total: 4 g/dL — ABNORMAL HIGH
M-Protein (monoclonal), Serum: 1.4 g/dL — ABNORMAL HIGH
Protein, Total, Serum / Plasma: 7.5 g/dL

## 2023-07-01 ENCOUNTER — Telehealth: Payer: PRIVATE HEALTH INSURANCE | Attending: Nurse Practitioner

## 2023-07-01 DIAGNOSIS — C9000 Multiple myeloma not having achieved remission: Secondary | ICD-10-CM

## 2023-07-01 LAB — CYTOMEGALOVIRUS DNA, QUANTITATIVE PCR, PLASMA: Cytomegalovirus DNA, Quantitative PCR: NEGATIVE IU/mL

## 2023-07-01 MED ORDER — ONDANSETRON HCL 8 MG TABLET
8 | Freq: Three times a day (TID) | ORAL | Status: AC | PRN
Start: 2023-07-01 — End: ?

## 2023-07-01 MED ORDER — POMALIDOMIDE 2 MG CAPSULE
2 mg | ORAL_CAPSULE | Freq: Every day | ORAL | 0 refills | Status: DC
Start: 2023-07-01 — End: 2023-08-21

## 2023-07-01 MED ORDER — IXAZOMIB 3 MG CAPSULE
3 mg | ORAL_CAPSULE | ORAL | 0 refills | Status: DC
Start: 2023-07-01 — End: 2023-08-18

## 2023-07-01 NOTE — Patient Instructions (Signed)
Start Ixazomib, Pomalidomide and Dex as prescribed    Continue acyclovir and eliquis    HOLD off on L- carnitine supplement due to potential interaction with other meds    Weekly labs for the first month after starting new regimen, then monthly if labs stable

## 2023-07-01 NOTE — Progress Notes (Signed)
STEPHEN HOWREY  [id number]    Date of Service: 07/01/2023    Reason for visit / Chief Complaints:    Subjective   Ariel Braun is a 62 y.o. female with MM who is on VIDEO follow-up    Events since the last visit: The patient has been OFF therapy. She had a prolonged hospitalization in early 2023 for ~ 5 months (CMV colitis, norovirus infection (chronic), FTT). Then readmitted ~6/8-7/13/23 - and transitioned to SNF for an extended period of time after discharge. Off feeding tube since 06/2022, taking all pills orally since 07/2022. Has been home since 09/2022.     Continues to deal with mental stressors.  Son had brain surgery 2 weeks ago and doing well. Her husband has diabetes.  Daughter, [name], who has been her main caregiver, has been sick with GI symptoms in the past year, diagnosed with [facility] and responded poorly with Cipro, fainting spells. Daughter has been unable to work for the past year. Concerned that doctors/specialist unable to figure out what she has. This worries her a lot.    Started on pristiq, now has been on it for a month.  Working with Dr. Caffie Pinto. Currently tapering off remeron. Some consipation with Pristiq.     She is thinking about starting IPd next week,.    Overall doing better. Feeling stronger, working on going up and down stairs.  Legs better but not at 100%, no falls. No fevers or infectious symptoms. Appetite good, does not feel like she needs remeron anymore for appetite but want to make sure her anxiety is managed.  Recently started on Pristiq.    No new bone pain or signs of progressive MM. No kidney/urinary difficuties. No f/chills, URI symptoms, sinus congestion, sore throat or other infectious symptoms. No N/V/dysuria/diarrhea.  No CP, palp.  No bleeding or bruising.      Oncology history:  DX:   IgG kappa myeloma, diagnosed 09/07/10   - BMBx: 8% plasma cells, normal cyto 46XX, FISH showed only 3 copies 1q21, Stage 2 ISS (3.88 beta 2 microglobulin),IgG 4750, M-spike 3.2, kappa  586.9 mg/L.     TREATMENT  1. LINE1: RVD 10/2010 through April 2011 (? 4 cycles). Achieved VGPR with M-spike 0.4, IgG 886, kappa 27.6.   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 M-spike 0.6, IgG 1270.     3. LINE2: 08/05/14: Started Rev 15 mg 3/4. M-spike 1, IgG 1640, kappa 62.2 - SD    Biochemical PD 04/17/19: M-1.4, KLC 96.3. ? PD. Up Rev to 15 mg qd (3/4) and dex 8 mg wkly(4/4).       07/20/19: L hip pain. M-1.2, KLC 96. Cont. Rev 15 mg (3/4)/dex 8 mg (4/4).     4. LINE4: 12/2019: DaraPomDex.       Past Medical History:   Diagnosis Date    Acid reflux disease     Anxiety     Depression     Hereditary angioedema (CMS code)     Multiple myeloma, without mention of having achieved remission     IgG kappa    Myeloma (CMS code) 05/10/2011     Past medical, family and social histories, as well as medications and allergies, were reviewed and updated in the medical record as appropriate.    Current Outpatient Medications   Medication Sig Dispense Refill    acyclovir (ZOVIRAX) 400 mg tablet Take 1 tablet (400 mg  total) by mouth in the morning and 1 tablet (400 mg total) in the evening. 180 tablet 3    apixaban (ELIQUIS) 5 mg tablet Take 1 tablet (5 mg total) by mouth 2 (two) times daily      cholecalciferol, vitamin D3, 1000 UNITS tablet 2 tablets (2,000 Units total) by Feeding Tube route daily (Patient not taking: Reported on 05/01/2023)      cyanocobalamin, Vitamin B12, (VITAMIN B-12) 1,000 mcg tablet Take 1 tablet (1,000 mcg total) by mouth daily      desvenlafaxine succinate (PRISTIQ) 25 mg 24 hr tablet TAKE 1 TABLET BY MOUTH EVERY DAY IN THE MORNING 90 tablet 1    dexAMETHasone (DECADRON) 4 mg tablet Take 5 tablets (20 mg total) by mouth every 7 (seven) days TAKE 5 TABLETS (20MG ) WEEKLY PRIOR TO iXAZOMIB. DO NOT TAKE FOR WEEK OFF IXAZOMIB 40 tablet 0    folic acid (FOLVITE) 1 mg tablet 1 tablet (1 mg total) by Feeding Tube route daily (Patient taking  differently: Take 1 tablet (1 mg total) by mouth daily)      ixazomib 3 mg CAP Take 3 mg by mouth every 7 (seven) days Take 3mg  on Day1, 8, 15 3 capsule 0    multivitamin with iron-minerals-folic acid (THERA M PLUS) 9 mg iron-400 mcg tablet 1 tablet by Feeding Tube route daily      ondansetron (ZOFRAN) 8 mg tablet 1 tablet (8 mg total) by Feeding Tube route in the morning and 1 tablet (8 mg total) at noon and 1 tablet (8 mg total) in the evening. (Patient not taking: Reported on 09/09/2022)      pomalidomide 2 mg CAP Take 1 capsule (2 mg total) by mouth nightly at bedtime TAKE ONE CAPSULE BY MOUTH FOR 21 DAYS FOLLOWED BY 7 DAYS OFF 21 capsule 0    prochlorperazine (COMPAZINE) 5 mg tablet 1 tablet (5 mg total) by Feeding Tube route every 8 (eight) hours as needed for Nausea (Patient not taking: Reported on 09/09/2022)      therapeutic multivitamin (THERAGRAN) tablet Take 1 tablet by mouth daily      thiamine mononitrate, vit B1, 100 mg tablet 1 tablet (100 mg total) by Feeding Tube route daily (Patient taking differently: Take 1 tablet (100 mg total) by mouth daily)       No current facility-administered medications for this visit.       ALLERGIES  Allergies/Contraindications   Allergen Reactions    Diphenoxylate-Atropine Abdominal Pain     Severe stomach pain     Caffeine Anxiety     "Jitters"       PHYSICAL EXAM- LIMIT EXAM BY VIDEO  Objective: ECOG1  Constitutional:  Pleasant over video  HENT:  Normocephalic, Neck- Supple.   SKIN: No rash, noted Eyes:  EOMI, sclera appear clear  Respiratory:  Chest excursions good Musculoskeletal: Moving all extremities.   Neurologic:  Alert & oriented x 3, nl affect, No focal deficits noted.       RESULTS: Labs in [facility] were reviewed. Lab results reviewed and interpretation provided to patient  Lab Results   Component Value Date    WBC Count 4.2 06/28/2023    Hemoglobin 12.1 06/28/2023    Hematocrit 36.1 06/28/2023    MCV 89 06/28/2023    Platelet Count 140 (L) 06/28/2023     Lab  Results   Component Value Date    Creatinine 0.60 06/28/2023    Creatinine Whole Blood 0.8 06/05/2014  Outside Labs:    No results found.  ASSESSMENT & PLAN  1. Multiple Myeloma: Impression: established - stable - OFF therapy - requiring disease monitoring  This patient is off therapy and at high-risk for progression and requiring intensive monitoring. The patient's last therapy has been DPd - this was toxic in that she had infectious comlications. She now has progression.      We reviewed the labs from 06/28/23: SIFE, SPEP, SFLC, CBC => Hgb 12.1, Cr 0.6, m-prot 1.4 (stable), KLC 176 (last 141), LLC 9.9, K/L ratio 17.8 (last 12.73).    PETCT 01/2023 - No new hypermetabolic disease.    At this time, due to biochemical progression, recommend starting a low-dose regimen given her frail state.    Ixazomib 3mg  PO on D1, 8, 15 Q28D  Pom 2mg  daily for 21 of 28 days  Dex 20mg  once weekly for 3 weeks out of 4 (can switch to methylpred 16mg  twice weekly M/Th if cannot tolerate dex)    Supportive care:  Acyclovir  Continue Eliquis for dvt ppx if on imid  To prevent neuropathy  - Vit B6 50 mg  - Alpha-lipoic acid 200 mg daily (she plans to try R-ALA formulation)  - avoid L carnitine due to potential DDI with SNRI and anticoagulation  - Zofran as needed for nausea    Follow the below labs after starting therapy, otherwise labs monthly.  Weekly blood tests - CBC, CMV pcr  Every 2 weeks - CMP  Every 4 weeks - Myeloma labs      The patient has a high complexity problem: multiple myeloma, which is an incurable malignancy and the patient is receiving active monitorring, and can have relapse which poses a threat to life and bodily function.    Regarding data complexity, I have reviewed the following tests => SPEP, SIFE, SFLCs, CBC and CMP.      Medical Decision Making/ high-risk  Plan:   - Plan to start IPd as above  - Continue to follow myeloma protein levels every 4-6 weeks  - Follow-up monthly with NP and every 3 months with  MD    2. Immunocompromised state/Infectious Disease concerns: Impression: established - immunocompromised from disease/treatment:  The patient is immunocompromised and is at high-risk from infection due to therapy. Has been avoiding Covid exposure. Following guidelines.    Requiring close follow-up for symptoms.  At risk for VZV reactivation and is on ACV. No current signs of infection. Will call with new signs of infection        Plan: - Continue prophylaxis - ACV  - Follow CMV pcr  - yearly flu vaccine  - Needs to shelter from Covid exposure  - The patient will call with any symptoms or signs of infection    3. Depression/Psychologic/Social Stressors: Impression: established - stable  The patient has cancer and has situation depression and is at risk for anxiety. Needs close follow-up    Currently, the attitude is good. There are no signs of anxiety or active depression. Mood appropriate, understands plan      Plan: No additional medications needed    4. Anemia: Impression: established - stable    The blood counts are stable, Hgb 10.9. Monitor labs monthly.      Plan: No transfusions or cytokines needed today.    5. Neutropenia/Thrombocytopenia: Impression: established - stable    The disease and prior treatment has toxicity on the bone marrow - no current signs of suppression    Plan: No  cytokines or transfusions    6. RI/ Electrolyte Replacement: Impression: established - stable     Taking ample fluids and avoiding nephrotoxins. Lytes & Cr Okay, appears euvolemic. Appetite good and weight unchanged. No new medications needed.     Plan: Continue adequate fluid intake & balanced, nutritional diet   - Continue as much exercise as possible.  - No additional supplementation needed.    7. Osteopenia: Impression: established - stable:    The patient is at risk for fracture due to MM. We recommend Calcium and Vit D replacement. We also recommend an aggressive exercise program.    Plan: - Consider Biphosphonates    8.  Secondary Cancer Risk: impression: established - NEEDS close follow-up  The patient has high-risk of secondary malignancies. Needs close follow-up and routine cancer screening.    Plan: - Derm follow-up yearly  - Colonoscopy every 5 years  - Screening per PCP.     Follow up: 1 month with Dr Daphine Deutscher    Royal Piedra NP  Hematology, Blood & Marrow Transplant, and Cellular Therapy  [place] [facility]  office [phone]  fax [phone]  07/01/23     Patient Care Team:  Trudie Reed, DO as PCP - General (Family Medicine)  Hilary Hertz, MD (Hematology and Oncology)  Generic Provider Mychart  Marzella Schlein, MD (Palliative Care Medicine)  Larina Earthly III, MD (Hematology)  Mills Koller, NP as Nurse Practitioner (Family Medicine)  Bethann Berkshire, MD (Transplant Hepatology)    APP Visit Information:     APP Service Type:  Independent  Available MD consultant:  Larina Earthly III, MD       Problems: incurable malignancy, requires close monitoring for disease progression  Risk of complications, morbidity/mortality of patient management: high; the patient's systemic cancer therapy requires regular and intensive monitoring for potential major/life-threatening toxicities  Data review/interpretation: I reviewed and/or ordered 3+ tests and/or documents    Currently with progressing disease

## 2023-07-04 MED ORDER — MIRTAZAPINE 7.5 MG TABLET
7.5 | ORAL_TABLET | ORAL | 0 refills | Status: DC
Start: 2023-07-04 — End: 2023-08-03

## 2023-07-04 MED ORDER — MIRTAZAPINE 7.5 MG TABLET
7.5 | ORAL_TABLET | ORAL | 0 refills | Status: DC
Start: 2023-07-04 — End: 2023-07-04

## 2023-07-12 LAB — COMPREHENSIVE METABOLIC PANEL (BMP, AST, ALT, T.BILI, ALKP, TP ALB)
AST: 19 IU/L (ref 0–40)
Alanine transaminase: 17 IU/L (ref 0–32)
Albumin, Serum / Plasma: 4 g/dL (ref 3.9–4.9)
Alkaline Phosphatase: 119 IU/L (ref 44–121)
BUN/Creatinine Ratio (External Lab): 26 (ref 12–28)
Bilirubin, Total: 0.8 mg/dL (ref 0.0–1.2)
Calcium, total, Serum / Plasma: 9.2 mg/dL (ref 8.7–10.3)
Carbon Dioxide, Total: 23 mmol/L (ref 20–29)
Chloride, Serum / Plasma: 107 mmol/L — ABNORMAL HIGH (ref 96–106)
Creatinine: 0.57 mg/dL (ref 0.57–1.00)
Globulin, Total: 2.6 g/dL (ref 1.5–4.5)
Glucose, non-fasting: 80 mg/dL (ref 70–99)
Potassium, Serum / Plasma: 3.8 mmol/L (ref 3.5–5.2)
Protein, Total, Serum / Plasma: 6.6 g/dL (ref 6.0–8.5)
Sodium, Serum / Plasma: 141 mmol/L (ref 134–144)
Urea Nitrogen, serum/plasma: 15 mg/dL (ref 8–27)
eGFRcr: 102 mL/min/{1.73_m2} (ref >59)

## 2023-07-12 LAB — COMPLETE BLOOD COUNT WITH DIFFERENTIAL
% Basophils: 1 %
% Eosinophils: 3 %
% Immature Granulocytes: 3 %
% Lymphocytes: 39 %
% Monocytes: 12 %
% Neutrophils: 42 %
Abs Basophils: 0 10*3/uL (ref 0.0–0.2)
Abs Eosinophils: 0.1 10*3/uL (ref 0.0–0.4)
Abs Lymphocytes: 1.3 10*3/uL (ref 0.7–3.1)
Abs Monocytes: 0.4 10*3/uL (ref 0.1–0.9)
Abs Neutrophils: 1.4 10*3/uL (ref 1.4–7.0)
Hematocrit: 35.8 % (ref 34.0–46.6)
Hemoglobin: 11.9 g/dL (ref 11.1–15.9)
MCH: 30.2 pg (ref 26.6–33.0)
MCHC: 33.2 g/dL (ref 31.5–35.7)
MCV: 91 fL (ref 79–97)
Platelet Count: 136 10*3/uL — ABNORMAL LOW (ref 150–450)
RBC Count: 3.94 x10E6/uL (ref 3.77–5.28)
RDW-CV: 16.4 % — ABNORMAL HIGH (ref 11.7–15.4)
WBC Count: 3.3 10*3/uL — ABNORMAL LOW (ref 3.4–10.8)

## 2023-07-15 LAB — CYTOMEGALOVIRUS DNA, QUANTITATIVE PCR, PLASMA: Cytomegalovirus DNA, Quantitative PCR: NEGATIVE IU/mL

## 2023-07-20 LAB — COMPREHENSIVE METABOLIC PANEL (BMP, AST, ALT, T.BILI, ALKP, TP ALB)
AST: 22 [IU]/L (ref 0–40)
Alanine transaminase: 19 [IU]/L (ref 0–32)
Albumin, Serum / Plasma: 4 g/dL (ref 3.9–4.9)
Alkaline Phosphatase: 121 [IU]/L (ref 44–121)
BUN/Creatinine Ratio (External Lab): 34 — ABNORMAL HIGH (ref 12–28)
Bilirubin, Total: 0.7 mg/dL (ref 0.0–1.2)
Calcium, total, Serum / Plasma: 9.4 mg/dL (ref 8.7–10.3)
Carbon Dioxide, Total: 20 mmol/L (ref 20–29)
Chloride, Serum / Plasma: 106 mmol/L (ref 96–106)
Creatinine: 0.62 mg/dL (ref 0.57–1.00)
Globulin, Total: 2.4 g/dL (ref 1.5–4.5)
Glucose, non-fasting: 81 mg/dL (ref 70–99)
Potassium, Serum / Plasma: 3.8 mmol/L (ref 3.5–5.2)
Protein, Total, Serum / Plasma: 6.4 g/dL (ref 6.0–8.5)
Sodium, Serum / Plasma: 139 mmol/L (ref 134–144)
Urea Nitrogen, serum/plasma: 21 mg/dL (ref 8–27)
eGFRcr: 100 mL/min/{1.73_m2} (ref >59)

## 2023-07-20 LAB — COMPLETE BLOOD COUNT WITH DIFFERENTIAL
% Basophils: 1 %
% Eosinophils: 3 %
% Lymphocytes: 42 %
% Monocytes: 17 %
% Neutrophils: 36 %
Abs Basophils: 0 10*3/uL (ref 0.0–0.2)
Abs Eosinophils: 0.1 10*3/uL (ref 0.0–0.4)
Abs Lymphocytes: 1.4 10*3/uL (ref 0.7–3.1)
Abs Monocytes: 0.6 10*3/uL (ref 0.1–0.9)
Abs Neutrophils: 1.3 10*3/uL — ABNORMAL LOW (ref 1.4–7.0)
Hematocrit: 35.8 % (ref 34.0–46.6)
Hemoglobin: 11.6 g/dL (ref 11.1–15.9)
MCH: 29.8 pg (ref 26.6–33.0)
MCHC: 32.4 g/dL (ref 31.5–35.7)
MCV: 92 fL (ref 79–97)
Platelet Count: 130 10*3/uL — ABNORMAL LOW (ref 150–450)
RBC Count: 3.89 x10E6/uL (ref 3.77–5.28)
RDW-CV: 16.3 % — ABNORMAL HIGH (ref 11.7–15.4)
WBC Count: 3.5 10*3/uL (ref 3.4–10.8)

## 2023-07-23 LAB — CYTOMEGALOVIRUS DNA, QUANTITATIVE PCR, PLASMA: Cytomegalovirus DNA, Quantitative PCR: NEGATIVE [IU]/mL

## 2023-07-27 LAB — COMPLETE BLOOD COUNT WITH DIFFERENTIAL
% Basophils: 1 %
% Eosinophils: 3 %
% Lymphocytes: 53 %
% Monocytes: 17 %
% Neutrophils: 25 %
Abs Basophils: 0 10*3/uL (ref 0.0–0.2)
Abs Eosinophils: 0.1 10*3/uL (ref 0.0–0.4)
Abs Lymphocytes: 1.3 10*3/uL (ref 0.7–3.1)
Abs Monocytes: 0.4 10*3/uL (ref 0.1–0.9)
Abs Neutrophils: 0.6 10*3/uL — ABNORMAL LOW (ref 1.4–7.0)
Hematocrit: 34.7 % (ref 34.0–46.6)
Hemoglobin: 11.5 g/dL (ref 11.1–15.9)
MCH: 30.1 pg (ref 26.6–33.0)
MCHC: 33.1 g/dL (ref 31.5–35.7)
MCV: 91 fL (ref 79–97)
Platelet Count: 139 10*3/uL — ABNORMAL LOW (ref 150–450)
RBC Count: 3.82 x10E6/uL (ref 3.77–5.28)
RDW-CV: 16.1 % — ABNORMAL HIGH (ref 11.7–15.4)
WBC Count: 2.4 10*3/uL — CL (ref 3.4–10.8)

## 2023-07-27 LAB — COMPREHENSIVE METABOLIC PANEL (BMP, AST, ALT, T.BILI, ALKP, TP ALB)
AST: 24 [IU]/L (ref 0–40)
Alanine transaminase: 25 [IU]/L (ref 0–32)
Albumin, Serum / Plasma: 3.9 g/dL (ref 3.9–4.9)
Alkaline Phosphatase: 130 [IU]/L — ABNORMAL HIGH (ref 44–121)
BUN/Creatinine Ratio (External Lab): 20 (ref 12–28)
Bilirubin, Total: 0.8 mg/dL (ref 0.0–1.2)
Calcium, total, Serum / Plasma: 9 mg/dL (ref 8.7–10.3)
Carbon Dioxide, Total: 22 mmol/L (ref 20–29)
Chloride, Serum / Plasma: 108 mmol/L — ABNORMAL HIGH (ref 96–106)
Creatinine: 0.59 mg/dL (ref 0.57–1.00)
Globulin, Total: 2.4 g/dL (ref 1.5–4.5)
Glucose, non-fasting: 85 mg/dL (ref 70–99)
Potassium, Serum / Plasma: 3.6 mmol/L (ref 3.5–5.2)
Protein, Total, Serum / Plasma: 6.3 g/dL (ref 6.0–8.5)
Sodium, Serum / Plasma: 142 mmol/L (ref 134–144)
Urea Nitrogen, serum/plasma: 12 mg/dL (ref 8–27)
eGFRcr: 101 mL/min/{1.73_m2} (ref >59)

## 2023-07-27 NOTE — Progress Notes (Signed)
ANC 0.6, last day of Pom for this cycle was yesterday 07/26/23.  Starts break week today 9/11.  Will set up zarxio at [facility]. Patient to repeat labs next Monday before beginning next cycle.

## 2023-07-27 NOTE — Telephone Encounter (Signed)
Subject: Neutropenia  Current Status:  The patient reports completing her courses of Pomalyst and Ninlaro, with her first day off being 07/27/2023.  The patient stated that she cannot get Neupogen locally because her local oncologist has left the clinic, and she currently does not have a local oncologist for follow-up care.  The patient is scheduled to have her labs redrawn on 08/01/2023 to monitor her neutropenia status.  Notifications:  Delaney Meigs, NP, has been made aware of the patients situation, including her inability to access Neupogen locally and her treatment completion.

## 2023-07-28 ENCOUNTER — Encounter: Admit: 2023-07-28 | Payer: PRIVATE HEALTH INSURANCE

## 2023-07-28 DIAGNOSIS — C9000 Multiple myeloma not having achieved remission: Secondary | ICD-10-CM

## 2023-07-28 MED ORDER — FILGRASTIM-SNDZ 300 MCG/0.5 ML INJECTION SYRINGE
300 | Freq: Every day | INTRAMUSCULAR | Status: DC | PRN
Start: 2023-07-28 — End: 2023-07-29
  Administered 2023-07-28: 21:00:00 300 ug via SUBCUTANEOUS

## 2023-07-28 MED FILL — ZARXIO 300 MCG/0.5 ML INJECTION SYRINGE: 300 300 mcg/0.5 mL | INTRAMUSCULAR | Qty: 1

## 2023-07-28 NOTE — Nursing Note (Signed)
Injection Administration Note    Drug: zarxio    Frequency: prn for anc < 1000 per tx orderd    Site: LEFT arm    Labs:    142 108* 12 / 85    3.6 22 0.59 \     09/11 0749     2.4* \ 11.5 / 139*    / 34.7 \    09/11 0749     ANC: 600    Next appointment: pt will call to schedule      Assessment: Pt arrived in stable condition, denies fever or flu like symptoms, cough, shortness of breath, pain. Pt tolerated injection without incident or complications. Pt discharged in stable condition.

## 2023-07-30 LAB — CYTOMEGALOVIRUS DNA, QUANTITATIVE PCR, PLASMA: Cytomegalovirus DNA, Quantitative PCR: NEGATIVE [IU]/mL

## 2023-08-02 LAB — COMPLETE BLOOD COUNT WITH DIFFERENTIAL
% Basophils: 1 %
% Eosinophils: 2 %
% Immature Granulocytes: 1 %
% Lymphocytes: 51 %
% Monocytes: 16 %
% Neutrophils: 29 %
Abs Basophils: 0 10*3/uL (ref 0.0–0.2)
Abs Eosinophils: 0.1 10*3/uL (ref 0.0–0.4)
Abs Lymphocytes: 1.4 10*3/uL (ref 0.7–3.1)
Abs Monocytes: 0.5 10*3/uL (ref 0.1–0.9)
Abs Neutrophils: 0.8 10*3/uL — ABNORMAL LOW (ref 1.4–7.0)
Hematocrit: 33.9 % — ABNORMAL LOW (ref 34.0–46.6)
Hemoglobin: 10.9 g/dL — ABNORMAL LOW (ref 11.1–15.9)
MCH: 29.5 pg (ref 26.6–33.0)
MCHC: 32.2 g/dL (ref 31.5–35.7)
MCV: 92 fL (ref 79–97)
Platelet Count: 147 10*3/uL — ABNORMAL LOW (ref 150–450)
RBC Count: 3.7 x10E6/uL — ABNORMAL LOW (ref 3.77–5.28)
RDW-CV: 16.3 % — ABNORMAL HIGH (ref 11.7–15.4)
WBC Count: 2.8 10*3/uL — ABNORMAL LOW (ref 3.4–10.8)

## 2023-08-02 LAB — COMPREHENSIVE METABOLIC PANEL (BMP, AST, ALT, T.BILI, ALKP, TP ALB)
AST: 21 [IU]/L (ref 0–40)
Alanine transaminase: 21 [IU]/L (ref 0–32)
Albumin, Serum / Plasma: 3.7 g/dL — ABNORMAL LOW (ref 3.9–4.9)
Alkaline Phosphatase: 120 [IU]/L (ref 44–121)
BUN/Creatinine Ratio (External Lab): 28 (ref 12–28)
Bilirubin, Total: 0.5 mg/dL (ref 0.0–1.2)
Calcium, total, Serum / Plasma: 9.8 mg/dL (ref 8.7–10.3)
Carbon Dioxide, Total: 22 mmol/L (ref 20–29)
Chloride, Serum / Plasma: 110 mmol/L — ABNORMAL HIGH (ref 96–106)
Creatinine: 0.64 mg/dL (ref 0.57–1.00)
Globulin, Total: 2.1 g/dL (ref 1.5–4.5)
Glucose, non-fasting: 88 mg/dL (ref 70–99)
Potassium, Serum / Plasma: 4 mmol/L (ref 3.5–5.2)
Protein, Total, Serum / Plasma: 5.8 g/dL — ABNORMAL LOW (ref 6.0–8.5)
Sodium, Serum / Plasma: 142 mmol/L (ref 134–144)
Urea Nitrogen, serum/plasma: 18 mg/dL (ref 8–27)
eGFRcr: 99 mL/min/{1.73_m2} (ref >59)

## 2023-08-02 LAB — LACTATE DEHYDROGENASE, BLOOD: LDH, serum/plasma: 166 [IU]/L (ref 119–226)

## 2023-08-03 ENCOUNTER — Telehealth: Admit: 2023-08-08 | Payer: PRIVATE HEALTH INSURANCE

## 2023-08-03 DIAGNOSIS — F419 Anxiety disorder, unspecified: Secondary | ICD-10-CM

## 2023-08-03 LAB — PROTEIN ELECTROPHORESIS, SERUM
A/G Ratio: 1.1 (ref 0.7–1.7)
Albumin, sPEP: 3.4 g/dL (ref 2.9–4.4)
Alpha 1 Globulin: 0.3 g/dL (ref 0.0–0.4)
Alpha 2 Globulin: 0.6 g/dL (ref 0.4–1.0)
Beta Globulin: 1 g/dL (ref 0.7–1.3)
Gamma Globulin: 1.2 g/dL (ref 0.4–1.8)
Globulin, Total: 3.1 g/dL (ref 2.2–3.9)
M-Protein (monoclonal), Serum: 1 g/dL — ABNORMAL HIGH
Protein, Total, Serum / Plasma: 6.5 g/dL (ref 6.0–8.5)

## 2023-08-03 LAB — IMMUNOFIXATION ELECTROPHORESIS, SERUM
IgA, Serum: 57 mg/dL — ABNORMAL LOW (ref 87–352)
IgG: 1440 mg/dL (ref 586–1602)
IgM, Serum: 57 mg/dL (ref 26–217)

## 2023-08-03 LAB — KAPPA AND LAMBDA FREE LIGHT CHAINS, SERUM
Kappa Light Chain, Serum, Free: 163.9 mg/L — ABNORMAL HIGH (ref 3.3–19.4)
Kappa/Lambda Ratio, Serum, Free: 13.22 — ABNORMAL HIGH (ref 0.26–1.65)
Lambda Light Chain, Serum, Free: 12.4 mg/L (ref 5.7–26.3)

## 2023-08-03 MED ORDER — NEULASTA 6 MG/0.6 ML SUBCUTANEOUS SYRINGE
6 | SUBCUTANEOUS | 0 refills | Status: DC
Start: 2023-08-03 — End: 2023-08-08

## 2023-08-03 NOTE — Progress Notes (Addendum)
Subjective    Ariel Braun is a 62 y.o. female with multiple myeloma (diagnosis category: Multiple myeloma (including amyloidosis)  metastatic) who is being seen via video visit while patient is at home at the request of Dr. Dolores Lory of (referral category: Oncology/Rad Onc/Onc Surgery) for symptom management.    Medical team: PCP Dr. Jannetta Quint, oncologist Dr. Delaney Meigs.  Hepatologist Dr. Newman Nip.    Primary caregiver(s):  Her daughter  Palliative care team member disciplines: Physician    HPI  Ariel Braun is a 62 y.o. female with improved anxiety with mental stressors and normal appetite.     Today patient/family reports:     Anxiety:  - Markedly improved after changing from mirtazapine to Pristiq but still continues to have mental stressors due to family issues.  - Her son is recuperating good but still has issues as his calcium levels are elevated and is being monitored. Her daughter is off work and continues to be ill with illness of unknown etiology and worsening symptoms with menses and palpitations, dizziness and stomach aches . Has followed up with 11 physicians for her daughter's illness. Husband also has health issues as he has been having insulin for his diabetes with blood glucose levels lowering up to the 40s. Has long-term anxiety as she has been the caregiver for her mom, husband and her kids.   - Takes Pristiq 25 mg PO qAM. Now able to stay calm and do things better after being started on Pristiq. Wonders of complications with long-term use of Pristiq as she read that it can lead to dementia. Worried as she has a family history of dementia.  - Not taking mirtazapine. Has been off mirtazapine for 2 weeks. States that she was feeling "weepy" at times and was having the need to cry with increased hunger.  No longer.    - Not taking lorazepam or Xanax  - Driving a car helps a lot. 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 Also makes crystal bracelets. States that while involving  herself in these hobbies, she is not worried about her mental stressors and helps with her anxiety. Has instant gratification after giving back to people as a sign of returning the blessings she received.    Appetite:  - Normal. Hunger levels and appetite have returned to normal after rotating from mirtazapine to Pristiq     Cancer-directed treatment:  - Takes dexamethasone with Ninlaro and pomalidomide. Was feeling more "edgy" and anxious with dexamethasone but is less and manageable with Pristiq.    HCM:  - Takes Eliquis BID  - Was also advised on taking 2 supplements, R-ALA (alpha lipoic acid) and L-carnitine which would help prevent peripheral neuropathy per her oncologist. Had questions if this would help her with peripheral neuropathy and if R-ALA works better than ALA.   Also said L carnitine can also affect serotonin and is supposed to be used with caution with blood thinners and also serotonergic drugs.  Wonders if she should take this?    Social history most significant for:  Lives with husband, mom and son, who has intellectual disability.  Isolated.  Cares for son along with help from daughter, whom she is close to.    Daughter also lives with her.    She reports she is in a caregiver role in her family.      Spiritual:  - Born and raised Catholic but tends to gain more spiritual information and guidance from principles that are  Christian-based and Bible-based and applies those principles and traditions in real life.  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    - She and her daughter watch a lot of [employer] programs that are uplifting and feels that it provides positive direction and helps her focus on good things in her life. Prays and reads the Bible and states that this helps.    Fam hx: Strong fam hx of addiction.  Father was alcoholic.  Mom used valium to cope.  Brothers with drug and alcohol addiction.      Patient/Family screened for spiritual care needs: Yes  Patient/Family screened for  psychosocial needs: Yes    SMS Well-Being Survey      06/26/2023     9:37 PM 08/03/2023     9:49 AM   SMS Well-Being Scores   Pain  8   Shortness of breath  0   Constipation 8 8   Tiredness  5   Nausea  0   Depression  3   Anxiety 7 7   Drowsiness  7   Appetite 0 9   Feeling of wellbeing 5 7   Other problem Sometimes feel shakiness (inside feeling not trimmers)  At times feel like it's too much med.    "I feel at peace." A moderate amount A moderate amount   "At times I worry I will be a burden to my family." A moderate amount A moderate amount   How would you rate your overall quality of life? Hendricks Limes   DPOA I have an Advanced Health Care Directive Health Care Directive            Objective    Physical Exam:   NAD  Sitting on couch  Normal neck ROM  Normal resp effort    Performance status (by Palliative Performance Scale): 70% - Reduced ambulation, Unable Normal Job/Work, Significant disease, Occasional assistance necessary, Normal or reduced intake, Full conscious level      Review of Prior Testing  I have personally reviewed and interpreted the following studies:  Lab Results   Component Value Date    WBC Count 2.8 (L) 08/01/2023    Hemoglobin 10.9 (L) 08/01/2023    Hematocrit 33.9 (L) 08/01/2023    MCV 92 08/01/2023    Platelet Count 147 (L) 08/01/2023     Lab Results   Component Value Date    Sodium, Serum / Plasma 142 08/01/2023    Potassium, Serum / Plasma 4.0 08/01/2023    Urea Nitrogen, serum/plasma 18 08/01/2023    Creatinine 0.64 08/01/2023     Lab Results   Component Value Date    Thyroid Stimulating Hormone 3.98 12/15/2021     No results found.    #  Cancer stage at diagnosis- see onc note for staging.      Assessment and Plan    Ariel Braun is a 62 y.o. 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       Anxiety: Anxiety related to her health, her children's health,  social isolation and the demands in her family dynamic that pressure her to feel she has to care for everyone else.  - Markedly improved after changing from mirtazapine to Pristiq but still continues to have mental stressors due to family issues.  - Continue Pristiq 25 mg PO qAM.  - Not taking mirtazapine 7.5 mg PO qD.  -  Discussed tapering off Pristiq in the future if she continues to have lower levels of anxiety and less mental stressors.  - Encouraged continuing on current practices that help improve her anxiety and suggested meditation practice.  Also suggested Anxiety and Phobia workbook by Thomasenia Sales.    - Supportive listening provided.  - Will look into dementia risk with Pristiq.  Appears to be retrospective study.  Her dose is low.  Advised this drug is often used in older adults.    Previously discussed:  - She reports active prayer and meditation practice.  - Not interested in talk therapy or counseling. She has not felt it was a good fit for her in the past.    Anorexia: Has normal appetite. Hunger levels and appetite have returned to normal after being started on Pristiq and stopping Mirtazapine.      Cancer-directed treatment and associated neuropathy: Planning on starting Ninlaro/Pom/Dex. Was advised on side effects of peripheral neuropathy   - Currently on Ninalro/Pom/Dex. Discussed that steroids like dexamethasone can increase her anxiety, given that she continues to have mental stressors.  Previously discussed:  - We had a discussion about R-ALA vs ALA and L-carnitine for treating peripheral neuropathy. Discussed that R-ALA is more biologically active and it would be more effective than ALA.  The effectiveness of L-carnitine is not well studied for preventing neuropathy.  Given possible drug-drug interactions with her blood thinner and anti-anxiety med and lack of high quality evidence suggesting benefit at neuropathy prevention with Ninlaro, suggested she just use R-ALA and not L-carnitine.   Her  oncologist agreed she does not need to take L-carnitine.         Advance Care Planning   - Prognostic awareness: N/A  - Relevant values/priorities: Reduction in anxiety, care for her son.    - Surrogate decision maker: Not addressed  - Resuscitation preferences: Unknown  -  AD: Deferred  - POLST: Deferred    - GOC convo deferred to future visit  - Today I spent 0 minutes with the patient specifically discussing ACP.     Time Spent  I spent a total of 45 minutes on this patient's care on the day of their visit excluding time spent related to any billed procedures. This time includes time spent with the patient as well as time spent documenting in the medical record, reviewing patient's records and tests, obtaining history, placing orders, communicating with other healthcare professionals, counseling the patient, family, or caregiver, and/or care coordination for the diagnoses above.    I performed this evaluation using real-time telehealth tools, including a live video Zoom connection between my location and the patient's location. Prior to initiating, the patient consented to perform this evaluation using telehealth tools.        Thank you for allowing [place] to participate in the care of your patient. Please feel free to reach out with any questions, concerns or ideas about additional ways we could serve your patient & their family.    Follow-up: October 19, 2023 at 12:00 PM.    Marzella Schlein, MD    I, Modesto Charon Mahalingam am acting as a scribe for services provided by Marzella Schlein, MD on 08/03/23, 12:42 PM.    The above scribed documentation as annotated by me accurately reflects the services I have provided.   Marzella Schlein, MD  08/07/2023 6:02 PM

## 2023-08-04 ENCOUNTER — Telehealth: Payer: PRIVATE HEALTH INSURANCE | Attending: Physician

## 2023-08-04 ENCOUNTER — Encounter: Admit: 2023-08-04 | Payer: PRIVATE HEALTH INSURANCE

## 2023-08-04 DIAGNOSIS — C9000 Multiple myeloma not having achieved remission: Secondary | ICD-10-CM

## 2023-08-04 LAB — COMPREHENSIVE METABOLIC PANEL (BMP, AST, ALT, T.BILI, ALKP, TP ALB)
AST: 31 [IU]/L (ref 0–40)
Alanine transaminase: 28 [IU]/L (ref 0–32)
Albumin, Serum / Plasma: 4.6 g/dL (ref 3.9–4.9)
Alkaline Phosphatase: 140 [IU]/L — ABNORMAL HIGH (ref 44–121)
BUN/Creatinine Ratio (External Lab): 38 — ABNORMAL HIGH (ref 12–28)
Bilirubin, Total: 0.7 mg/dL (ref 0.0–1.2)
Calcium, total, Serum / Plasma: 10.3 mg/dL (ref 8.7–10.3)
Carbon Dioxide, Total: 22 mmol/L (ref 20–29)
Chloride, Serum / Plasma: 105 mmol/L (ref 96–106)
Creatinine: 0.58 mg/dL (ref 0.57–1.00)
Globulin, Total: 2.7 g/dL (ref 1.5–4.5)
Glucose, non-fasting: 194 mg/dL — ABNORMAL HIGH (ref 70–99)
Potassium, Serum / Plasma: 4.4 mmol/L (ref 3.5–5.2)
Protein, Total, Serum / Plasma: 7.3 g/dL (ref 6.0–8.5)
Sodium, Serum / Plasma: 141 mmol/L (ref 134–144)
Urea Nitrogen, serum/plasma: 22 mg/dL (ref 8–27)
eGFRcr: 102 mL/min/{1.73_m2} (ref >59)

## 2023-08-04 LAB — COMPLETE BLOOD COUNT WITH DIFFERENTIAL
% Basophils: 1 %
% Eosinophils: 0 %
% Immature Granulocytes: 3 %
% Lymphocytes: 50 %
% Monocytes: 2 %
% Neutrophils: 47 %
Abs Basophils: 0 10*3/uL (ref 0.0–0.2)
Abs Eosinophils: 0 10*3/uL (ref 0.0–0.4)
Abs Lymphocytes: 1 10*3/uL (ref 0.7–3.1)
Abs Monocytes: 0 10*3/uL — ABNORMAL LOW (ref 0.1–0.9)
Abs Neutrophils: 0.9 10*3/uL — ABNORMAL LOW (ref 1.4–7.0)
Hematocrit: 38.7 % (ref 34.0–46.6)
Hemoglobin: 12.4 g/dL (ref 11.1–15.9)
MCH: 30 pg (ref 26.6–33.0)
MCHC: 32 g/dL (ref 31.5–35.7)
MCV: 94 fL (ref 79–97)
Platelet Count: 170 10*3/uL (ref 150–450)
RBC Count: 4.13 x10E6/uL (ref 3.77–5.28)
RDW-CV: 16.3 % — ABNORMAL HIGH (ref 11.7–15.4)
WBC Count: 1.9 10*3/uL — CL (ref 3.4–10.8)

## 2023-08-04 LAB — CYTOMEGALOVIRUS DNA, QUANTITATIVE PCR, PLASMA: Cytomegalovirus DNA, Quantitative PCR: NEGATIVE [IU]/mL

## 2023-08-04 MED ORDER — FILGRASTIM-SNDZ 300 MCG/0.5 ML INJECTION SYRINGE
300 | Freq: Every day | INTRAMUSCULAR | Status: DC | PRN
Start: 2023-08-04 — End: 2023-08-05
  Administered 2023-08-04: 20:00:00 300 ug via SUBCUTANEOUS

## 2023-08-04 MED FILL — ZARXIO 300 MCG/0.5 ML INJECTION SYRINGE: 300 300 mcg/0.5 mL | INTRAMUSCULAR | Qty: 1

## 2023-08-04 NOTE — Nursing Note (Signed)
Multiple myeloma, remission status unspecified                                                        Injection Administration Note     Drug Name: Zarxio  Frequency: as needed    Site: left arm  Required lab work (if applicable): cbc     Pt denies recent fever or infection. VSS. No new or worsening symptoms. Injection to  left arm . Injection tolerated well. No bleeding noted after injection. Site C/D/I. Dressed with band aid. Discharged home in stable and ambulatory condition.        .COMPLETE THE DAY NON-ONC:   The following check list items below have been reviewed and implemented:   1. Therapy Plan and Signed and Held Orders   2. Order Review: Review for any required lab orders  3. Medication start and end times completed   4. Follow-up appointment scheduled (Date/Time): 08/05/23 at 1130.  5. Comments: see note'

## 2023-08-04 NOTE — Patient Instructions (Addendum)
Will arrange video visit with nutrition ([facility])  Continue IPd for now - you will need neupogen injections  Can get flu vaccine this October.

## 2023-08-04 NOTE — Progress Notes (Signed)
ADEBOLA BIRCHARD  [id number]    Date of Service: 08/04/2023    Reason for visit / Chief Complaints:    Subjective   Ariel Braun is a 62 y.o. female with MM who is on VIDEO follow-up    I performed this consultation using real-time Telehealth tools, including a live video connection between my location and the patient's location. Prior to initiating the consultation, I obtained informed verbal consent to perform this consultation using Telehealth tools and answered all the questions about the Telehealth interaction. Patient in [place].    Events since the last visit: The patient has been ON IPd therapy. Has been doing OK. So far tolerating OK. The patient is OK to continue. No current infectious symptoms. No "hot spots" or new bone pain. No significant signs of progressive MM. No kidney/urinary difficuties. No f/chills, URI symptoms, sinus congestion, sore throat or other infectious symptoms. Has been sheltering. No N/V/dysuria/diarrhea.  No CP, palp.  No bleeding or bruising.    Oncology history:  DX:   IgG kappa myeloma, diagnosed 09/07/10   - BMBx: 8% plasma cells, normal cyto 46XX, FISH showed only 3 copies 1q21, Stage 2 ISS (3.88 beta 2 microglobulin),IgG 4750, M-spike 3.2, kappa 586.9 mg/L.     TREATMENT  1. LINE1: RVD 10/2010 through April 2011 (? 4 cycles). Achieved VGPR with M-spike 0.4, IgG 886, kappa 27.6.   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 M-spike 0.6, IgG 1270.     3. LINE2: 08/05/14: Started Rev 15 mg 3/4. M-spike 1, IgG 1640, kappa 62.2 - SD    Biochemical PD 04/17/19: M-1.4, KLC 96.3. ? PD. Up Rev to 15 mg qd (3/4) and dex 8 mg wkly(4/4).       07/20/19: L hip pain. M-1.2, KLC 96. Cont. Rev 15 mg (3/4)/dex 8 mg (4/4).     4. LINE4: 12/2019: DaraPomDex.       Past Medical History:   Diagnosis Date    Acid reflux disease     Anxiety     Depression     Hereditary angioedema (CMS code)     Multiple myeloma, without mention of having  achieved remission     IgG kappa    Myeloma (CMS code) 05/10/2011     Past medical, family and social histories, as well as medications and allergies, were reviewed and updated in the medical record as appropriate.    Current Outpatient Medications   Medication Sig Dispense Refill    acyclovir (ZOVIRAX) 400 mg tablet Take 1 tablet (400 mg total) by mouth in the morning and 1 tablet (400 mg total) in the evening. 180 tablet 3    apixaban (ELIQUIS) 5 mg tablet Take 1 tablet (5 mg total) by mouth 2 (two) times daily      desvenlafaxine succinate (PRISTIQ) 25 mg 24 hr tablet TAKE 1 TABLET BY MOUTH EVERY DAY IN THE MORNING 90 tablet 1    dexAMETHasone (DECADRON) 4 mg tablet Take 5 tablets (20 mg total) by mouth every 7 (seven) days TAKE 5 TABLETS (20MG ) WEEKLY PRIOR TO iXAZOMIB. DO NOT TAKE FOR WEEK OFF IXAZOMIB (Patient not taking: Reported on 07/01/2023) 40 tablet 0    ixazomib 3 mg CAP Take 3 mg by mouth every 7 (seven) days Take 3mg  on Day1, 8, 15 (Patient not taking: Reported on 07/01/2023) 9 capsule 0    ondansetron (ZOFRAN) 8 mg tablet Take 1 tablet (  8 mg total) by mouth every 8 (eight) hours as needed for Nausea      pegfilgrastim (NEULASTA) 6 mg/0.6 mL injection syringe Inject 0.6 mL (6 mg total) under the skin every 14 (fourteen) days Neulasta 6mg  subcutaneous injection every 14 days as needed for anc<1 2.4 mL 0    pomalidomide 2 mg CAP Take 1 capsule (2 mg total) by mouth nightly at bedtime TAKE ONE CAPSULE BY MOUTH FOR 21 DAYS FOLLOWED BY 7 DAYS OFF (Patient not taking: Reported on 07/01/2023) 21 capsule 0     No current facility-administered medications for this visit.     Facility-Administered Medications Ordered in Other Visits   Medication Dose Route Frequency Provider Last Rate Last Admin    filgrastim-sndz [facility]) injection syringe 300 mcg  300 mcg Subcutaneous Daily PRN Mills Koller, NP   300 mcg at 08/04/23 1310       ALLERGIES  Allergies/Contraindications   Allergen Reactions     Diphenoxylate-Atropine Abdominal Pain     Severe stomach pain     Caffeine Anxiety     "Jitters"       PHYSICAL EXAM- LIMIT EXAM BY VIDEO  Objective: ECOG1  Constitutional:  Pleasant over video  HENT:  Normocephalic, Neck- Supple.   SKIN: No rash noted Eyes:  PERRL, EOMI, sclera appear clear  Neurologic:  Alert & interactive, nl affect, No focal deficits noted.       RESULTS: Labs in [facility] were reviewed. Lab results reviewed and interpretation provided to patient  Lab Results   Component Value Date    WBC Count 1.9 (LL) 08/03/2023    Hemoglobin 12.4 08/03/2023    Hematocrit 38.7 08/03/2023    MCV 94 08/03/2023    Platelet Count 170 08/03/2023     Lab Results   Component Value Date    Creatinine 0.58 08/03/2023    Creatinine (POCT) 0.8 06/05/2014         Outside Labs:    No results found.  ASSESSMENT & PLAN  1. Multiple Myeloma: Impression: established - stable - on therapy - requiring disease monitoring  This patient is on therapy and at high-risk for side effects and progression and requiring intensive monitoring. The patient's therapy is IPd - just started 2nd cycle - no dose adjustments    We reviewed the labs: SIFE, SPEP, SFLC, CBC => m-prot 1, IgG 1440. KLC 164, LLC 12.4, Hgb 10.9, WBC 1.9, ANC 0.9    We discussed that she is doing fine and just completed 1 cycle. Will start 2nd cycle. May need PRN cytokines for neutropenia. IgG level is slightly lower.      The patient has a high complexity problem: multiple myeloma, which is an incurable malignancy and the patient is receiving active therapy, which have side effects and poses a threat to life and bodily function.    Regarding data complexity, I have reviewed the following tests => SPEP, SIFE, SFLCs, CBC and CMP.      Medical Decision Making/ Plan: - Continue on IPd therapy   - Continue to follow myeloma protein levels every 4-6 weeks  - Follow-up in 2-4 weeks with Harriett Sine  - 3 months with Daphine Deutscher    2. Immunocompromised state/Infectious Disease Concerns:  Impression: established - immunocompromised from disease/treatment:  The patient is immunocompromised and is at high-risk from infection due to therapy. Needs to be up-to-date on Covid vaccine. Has been sheltering from exposure. No exposures.    Requiring close follow-up for symptoms.  At risk  for VZV reactivation and is on ACV. No current signs of infection. Will call with new signs of infection        Plan: - Continue prophylaxis  - yearly flu vaccine - can get in October  - Shelter from [place] exposure - does not want new booster  - The patient will call with any symptoms or signs of infection    3. Depression/Psychologic/Social Stressors: Impression: established - stable  The patient has cancer and has situation depression and is at risk for anxiety. Needs close follow-up    Currently, the attitude is good. There are no signs of anxiety or active depression. Mood appropriate, understands plan      Plan: No additional medications needed    4. Anemia: Impression: established - stable    The blood counts are Good, follow. Next CBC in 2-4 weeks.       Plan: No transfusions or cytokines needed today.    5. Neutropenia/Thrombocytopenia: Impression: established - stable    The treatment has toxicity on the bone marrow - no current signs of suppression    Plan: No cytokines or transfusions    6. RI/ Electrolyte Replacement: Impression: established - stable     Taking ample fluids and avoiding nephrotoxins.  Lytes & Cr Okay, appears euvolemic. Appetite good and weight unchanged. No new medications needed.     Plan: Continue adequate fluid intake & balanced, nutritional diet   - Continue as much exercise as possible.  - No additional supplementation needed.    7. Osteopenia: Impression: established - stable:    The patient is at risk for fracture due to MM. We recommend Calcium and Vit D replacement. We also recommend an aggressive exercise program.    Plan: - No  Biphosphonates    8. Secondary Cancer Risk: impression:  established - NEEDS close follow-up  The patient has high-risk of secondary malignancies. Needs close follow-up and routine cancer screening.    Plan: - Derm follow-up yearly  - Colonoscopy every 5 years  - Screening per PCP.

## 2023-08-07 LAB — CYTOMEGALOVIRUS DNA, QUANTITATIVE PCR, PLASMA: Cytomegalovirus DNA, Quantitative PCR: NEGATIVE [IU]/mL

## 2023-08-08 MED ORDER — FYLNETRA 6 MG/0.6 ML SUBCUTANEOUS SYRINGE
6 mg/0. mL | SUBCUTANEOUS | 0 refills | Status: DC
Start: 2023-08-08 — End: 2023-08-16

## 2023-08-09 NOTE — Progress Notes (Signed)
Started Ixazomib/Pomalidomide and dexamethasone for progressive multiple myeloma.  C1D1 was 07/06/23.  Baseline wbc was 4.2, ANC 1.6.  Repeat labs on 07/27/23 showed Wbc 2.4 and ANC 0.6. Patient given zarxio x1 on 9/12. Repeat labs on 08/01/23 remained subtherapeutic, with wbc 2.8 and ANC 0.8.  Wbc 1.9 and ANC 0.9 on 9/18, given zarxio again on 08/04/23.    Patient will continue on myelosuppressive therapy for her progressive myeloma and is at high risk for infections and sepsis due to immunocompromised state. She will benefit from granulocyte colony stimulating factor (Fylnetra) to decrease risk for serious infections and to allow for timely treatments and minimize delays in treatment.

## 2023-08-10 LAB — COMPLETE BLOOD COUNT WITH DIFFERENTIAL
% Basophils: 1 %
% Eosinophils: 3 %
% Immature Granulocytes: 5 %
% Lymphocytes: 36 %
% Monocytes: 8 %
% Neutrophils: 47 %
Abs Basophils: 0 10*3/uL (ref 0.0–0.2)
Abs Eosinophils: 0.1 10*3/uL (ref 0.0–0.4)
Abs Lymphocytes: 1.4 10*3/uL (ref 0.7–3.1)
Abs Monocytes: 0.3 10*3/uL (ref 0.1–0.9)
Abs Neutrophils: 1.8 10*3/uL (ref 1.4–7.0)
Hematocrit: 36.8 % (ref 34.0–46.6)
Hemoglobin: 12.1 g/dL (ref 11.1–15.9)
MCH: 30.2 pg (ref 26.6–33.0)
MCHC: 32.9 g/dL (ref 31.5–35.7)
MCV: 92 fL (ref 79–97)
Platelet Count: 158 10*3/uL (ref 150–450)
RBC Count: 4.01 x10E6/uL (ref 3.77–5.28)
RDW-CV: 16.3 % — ABNORMAL HIGH (ref 11.7–15.4)
WBC Count: 3.8 10*3/uL (ref 3.4–10.8)

## 2023-08-10 LAB — COMPREHENSIVE METABOLIC PANEL (BMP, AST, ALT, T.BILI, ALKP, TP ALB)
AST: 21 [IU]/L (ref 0–40)
Alanine transaminase: 20 [IU]/L (ref 0–32)
Albumin, Serum / Plasma: 4.2 g/dL (ref 3.9–4.9)
Alkaline Phosphatase: 129 [IU]/L — ABNORMAL HIGH (ref 44–121)
BUN/Creatinine Ratio (External Lab): 21 (ref 12–28)
Bilirubin, Total: 1 mg/dL (ref 0.0–1.2)
Calcium, total, Serum / Plasma: 9.5 mg/dL (ref 8.7–10.3)
Carbon Dioxide, Total: 24 mmol/L (ref 20–29)
Chloride, Serum / Plasma: 105 mmol/L (ref 96–106)
Creatinine: 0.62 mg/dL (ref 0.57–1.00)
Globulin, Total: 2.5 g/dL (ref 1.5–4.5)
Glucose, non-fasting: 91 mg/dL (ref 70–99)
Potassium, Serum / Plasma: 3.8 mmol/L (ref 3.5–5.2)
Protein, Total, Serum / Plasma: 6.7 g/dL (ref 6.0–8.5)
Sodium, Serum / Plasma: 142 mmol/L (ref 134–144)
Urea Nitrogen, serum/plasma: 13 mg/dL (ref 8–27)
eGFRcr: 100 mL/min/{1.73_m2} (ref >59)

## 2023-08-12 LAB — CYTOMEGALOVIRUS DNA, QUANTITATIVE PCR, PLASMA: Cytomegalovirus DNA, Quantitative PCR: NEGATIVE [IU]/mL

## 2023-08-16 MED ORDER — FILGRASTIM 300 MCG/0.5 ML INJECTION SYRINGE
300 mcg/0.5 mL | Freq: Every day | INTRAMUSCULAR | 0 refills | Status: DC | PRN
Start: 2023-08-16 — End: 2023-08-23

## 2023-08-17 LAB — COMPLETE BLOOD COUNT WITH DIFFERENTIAL
% Basophils: 1 %
% Eosinophils: 4 %
% Immature Granulocytes: 1 %
% Lymphocytes: 37 %
% Monocytes: 15 %
% Neutrophils: 42 %
Abs Basophils: 0 10*3/uL (ref 0.0–0.2)
Abs Eosinophils: 0.1 10*3/uL (ref 0.0–0.4)
Abs Lymphocytes: 1.1 10*3/uL (ref 0.7–3.1)
Abs Monocytes: 0.4 10*3/uL (ref 0.1–0.9)
Abs Neutrophils: 1.2 10*3/uL — ABNORMAL LOW (ref 1.4–7.0)
Hematocrit: 36.8 % (ref 34.0–46.6)
Hemoglobin: 11.8 g/dL (ref 11.1–15.9)
MCH: 29.9 pg (ref 26.6–33.0)
MCHC: 32.1 g/dL (ref 31.5–35.7)
MCV: 93 fL (ref 79–97)
Platelet Count: 151 10*3/uL (ref 150–450)
RBC Count: 3.94 x10E6/uL (ref 3.77–5.28)
RDW-CV: 16.1 % — ABNORMAL HIGH (ref 11.7–15.4)
WBC Count: 2.9 10*3/uL — ABNORMAL LOW (ref 3.4–10.8)

## 2023-08-17 LAB — COMPREHENSIVE METABOLIC PANEL (BMP, AST, ALT, T.BILI, ALKP, TP ALB)
AST: 20 [IU]/L (ref 0–40)
Alanine transaminase: 18 [IU]/L (ref 0–32)
Albumin, Serum / Plasma: 3.9 g/dL (ref 3.9–4.9)
Alkaline Phosphatase: 116 [IU]/L (ref 44–121)
BUN/Creatinine Ratio (External Lab): 27 (ref 12–28)
Bilirubin, Total: 0.8 mg/dL (ref 0.0–1.2)
Calcium, total, Serum / Plasma: 8.9 mg/dL (ref 8.7–10.3)
Carbon Dioxide, Total: 21 mmol/L (ref 20–29)
Chloride, Serum / Plasma: 108 mmol/L — ABNORMAL HIGH (ref 96–106)
Creatinine: 0.6 mg/dL (ref 0.57–1.00)
Globulin, Total: 2.4 g/dL (ref 1.5–4.5)
Glucose, non-fasting: 88 mg/dL (ref 70–99)
Potassium, Serum / Plasma: 4 mmol/L (ref 3.5–5.2)
Protein, Total, Serum / Plasma: 6.3 g/dL (ref 6.0–8.5)
Sodium, Serum / Plasma: 141 mmol/L (ref 134–144)
Urea Nitrogen, serum/plasma: 16 mg/dL (ref 8–27)
eGFRcr: 101 mL/min/{1.73_m2} (ref >59)

## 2023-08-19 LAB — CYTOMEGALOVIRUS DNA, QUANTITATIVE PCR, PLASMA: Cytomegalovirus DNA, Quantitative PCR: NEGATIVE [IU]/mL

## 2023-08-21 MED ORDER — POMALYST 2 MG CAPSULE
2 mg | ORAL_CAPSULE | Freq: Every day | ORAL | 0 refills | Status: DC
Start: 2023-08-21 — End: 2023-09-15

## 2023-08-21 MED ORDER — IXAZOMIB 3 MG CAPSULE
3 mg | ORAL_CAPSULE | ORAL | 0 refills | Status: DC
Start: 2023-08-21 — End: 2023-10-29

## 2023-08-21 MED ORDER — DEXAMETHASONE 4 MG TABLET
4 mg | ORAL_TABLET | ORAL | 0 refills | Status: DC
Start: 2023-08-21 — End: 2023-10-19

## 2023-08-23 MED ORDER — NIVESTYM 300 MCG/ML INJECTION SOLUTION
300 mcg/mL | Freq: Every day | INTRAMUSCULAR | 0 refills | Status: DC | PRN
Start: 2023-08-23 — End: 2023-11-07

## 2023-08-25 LAB — COMPLETE BLOOD COUNT WITH DIFFERENTIAL
% Basophils: 1 %
% Eosinophils: 8 %
% Immature Granulocytes: 1 %
% Lymphocytes: 55 %
% Monocytes: 14 %
% Neutrophils: 22 %
Abs Basophils: 0 10*3/uL (ref 0.0–0.2)
Abs Eosinophils: 0.2 10*3/uL (ref 0.0–0.4)
Abs Lymphocytes: 1.2 10*3/uL (ref 0.7–3.1)
Abs Monocytes: 0.3 10*3/uL (ref 0.1–0.9)
Abs Neutrophils: 0.5 10*3/uL — ABNORMAL LOW (ref 1.4–7.0)
Hematocrit: 35.8 % (ref 34.0–46.6)
Hemoglobin: 11.5 g/dL (ref 11.1–15.9)
MCH: 30 pg (ref 26.6–33.0)
MCHC: 32.1 g/dL (ref 31.5–35.7)
MCV: 94 fL (ref 79–97)
Platelet Count: 148 10*3/uL — ABNORMAL LOW (ref 150–450)
RBC Count: 3.83 x10E6/uL (ref 3.77–5.28)
RDW-CV: 16.1 % — ABNORMAL HIGH (ref 11.7–15.4)
WBC Count: 2.1 10*3/uL — CL (ref 3.4–10.8)

## 2023-08-25 LAB — COMPREHENSIVE METABOLIC PANEL (BMP, AST, ALT, T.BILI, ALKP, TP ALB)
AST: 20 [IU]/L (ref 0–40)
Alanine transaminase: 19 [IU]/L (ref 0–32)
Albumin, Serum / Plasma: 3.8 g/dL — ABNORMAL LOW (ref 3.9–4.9)
Alkaline Phosphatase: 115 [IU]/L (ref 44–121)
BUN/Creatinine Ratio (External Lab): 24 (ref 12–28)
Bilirubin, Total: 0.7 mg/dL (ref 0.0–1.2)
Calcium, total, Serum / Plasma: 8.7 mg/dL (ref 8.7–10.3)
Carbon Dioxide, Total: 24 mmol/L (ref 20–29)
Chloride, Serum / Plasma: 108 mmol/L — ABNORMAL HIGH (ref 96–106)
Creatinine: 0.7 mg/dL (ref 0.57–1.00)
Globulin, Total: 2.3 g/dL (ref 1.5–4.5)
Glucose, non-fasting: 86 mg/dL (ref 70–99)
Potassium, Serum / Plasma: 4.1 mmol/L (ref 3.5–5.2)
Protein, Total, Serum / Plasma: 6.1 g/dL (ref 6.0–8.5)
Sodium, Serum / Plasma: 141 mmol/L (ref 134–144)
Urea Nitrogen, serum/plasma: 17 mg/dL (ref 8–27)
eGFRcr: 97 mL/min/{1.73_m2} (ref >59)

## 2023-08-26 ENCOUNTER — Encounter: Admit: 2023-08-26 | Payer: PRIVATE HEALTH INSURANCE

## 2023-08-26 DIAGNOSIS — C9000 Multiple myeloma not having achieved remission: Secondary | ICD-10-CM

## 2023-08-26 MED ORDER — FILGRASTIM-SNDZ 300 MCG/0.5 ML INJECTION SYRINGE
300 | Freq: Every day | INTRAMUSCULAR | Status: DC | PRN
Start: 2023-08-26 — End: 2023-08-27
  Administered 2023-08-26: 21:00:00 300 ug via SUBCUTANEOUS

## 2023-08-26 MED FILL — ZARXIO 300 MCG/0.5 ML INJECTION SYRINGE: 300 300 mcg/0.5 mL | INTRAMUSCULAR | Qty: 1

## 2023-08-26 NOTE — Nursing Note (Signed)
Injection Administration Note     Drug: zarxio     Frequency: prn for anc < 1000 per tx orderd     Site: RIGHT arm    Labs:    141 108* 17 / 86    4.1 24 0.70 \     10/10 0832     2.1* \ 11.5 / 148*    / 35.8 \    10/10 0832     ANC: 0.5     Next appointment: pt will call to schedule        Assessment: Pt arrived in stable condition, denies fever or flu like symptoms, cough, shortness of breath, pain. Pt tolerated injection without incident or complications. Pt discharged in stable condition.

## 2023-08-27 LAB — CYTOMEGALOVIRUS DNA, QUANTITATIVE PCR, PLASMA: Cytomegalovirus DNA, Quantitative PCR: NEGATIVE [IU]/mL

## 2023-08-30 LAB — COMPLETE BLOOD COUNT WITH DIFFERENTIAL
% Basophils: 0 %
% Eosinophils: 1 %
% Immature Granulocytes: 1 %
% Lymphocytes: 50 %
% Monocytes: 14 %
% Neutrophils: 35 %
Abs Basophils: 0 10*3/uL (ref 0.0–0.2)
Abs Eosinophils: 0 10*3/uL (ref 0.0–0.4)
Abs Lymphocytes: 1.7 10*3/uL (ref 0.7–3.1)
Abs Monocytes: 0.5 10*3/uL (ref 0.1–0.9)
Abs Neutrophils: 1.2 10*3/uL — ABNORMAL LOW (ref 1.4–7.0)
Hematocrit: 34.9 % (ref 34.0–46.6)
Hemoglobin: 11.2 g/dL (ref 11.1–15.9)
MCH: 29.6 pg (ref 26.6–33.0)
MCHC: 32.1 g/dL (ref 31.5–35.7)
MCV: 92 fL (ref 79–97)
Platelet Count: 177 10*3/uL (ref 150–450)
RBC Count: 3.79 x10E6/uL (ref 3.77–5.28)
RDW-CV: 15.9 % — ABNORMAL HIGH (ref 11.7–15.4)
WBC Count: 3.3 10*3/uL — ABNORMAL LOW (ref 3.4–10.8)

## 2023-08-30 LAB — COMPREHENSIVE METABOLIC PANEL (BMP, AST, ALT, T.BILI, ALKP, TP ALB)
AST: 21 [IU]/L (ref 0–40)
Alanine transaminase: 18 [IU]/L (ref 0–32)
Albumin, Serum / Plasma: 4 g/dL (ref 3.9–4.9)
Alkaline Phosphatase: 116 [IU]/L (ref 44–121)
BUN/Creatinine Ratio (External Lab): 23 (ref 12–28)
Bilirubin, Total: 0.7 mg/dL (ref 0.0–1.2)
Calcium, total, Serum / Plasma: 9.2 mg/dL (ref 8.7–10.3)
Carbon Dioxide, Total: 24 mmol/L (ref 20–29)
Chloride, Serum / Plasma: 109 mmol/L — ABNORMAL HIGH (ref 96–106)
Creatinine: 0.6 mg/dL (ref 0.57–1.00)
Globulin, Total: 2.5 g/dL (ref 1.5–4.5)
Glucose, non-fasting: 90 mg/dL (ref 70–99)
Potassium, Serum / Plasma: 4.2 mmol/L (ref 3.5–5.2)
Protein, Total, Serum / Plasma: 6.5 g/dL (ref 6.0–8.5)
Sodium, Serum / Plasma: 143 mmol/L (ref 134–144)
Urea Nitrogen, serum/plasma: 14 mg/dL (ref 8–27)
eGFRcr: 101 mL/min/{1.73_m2} (ref >59)

## 2023-08-30 LAB — LACTATE DEHYDROGENASE, BLOOD: LDH, serum/plasma: 165 [IU]/L (ref 119–226)

## 2023-09-01 LAB — PROTEIN ELECTROPHORESIS, SERUM
A/G Ratio: 1 (ref 0.7–1.7)
Albumin, sPEP: 3.4 g/dL (ref 2.9–4.4)
Alpha 1 Globulin: 0.3 g/dL (ref 0.0–0.4)
Alpha 2 Globulin: 0.7 g/dL (ref 0.4–1.0)
Beta Globulin: 1.2 g/dL (ref 0.7–1.3)
Gamma Globulin: 1.2 g/dL (ref 0.4–1.8)
Globulin, Total: 3.3 g/dL (ref 2.2–3.9)
M-Protein (monoclonal), Serum: 0.9 g/dL — ABNORMAL HIGH
Protein, Total, Serum / Plasma: 6.7 g/dL (ref 6.0–8.5)

## 2023-09-01 LAB — CYTOMEGALOVIRUS DNA, QUANTITATIVE PCR, PLASMA: Cytomegalovirus DNA, Quantitative PCR: NEGATIVE [IU]/mL

## 2023-09-01 LAB — IMMUNOFIXATION ELECTROPHORESIS, SERUM
IgA, Serum: 64 mg/dL — ABNORMAL LOW (ref 87–352)
IgG: 1356 mg/dL (ref 586–1602)
IgM, Serum: 50 mg/dL (ref 26–217)

## 2023-09-01 LAB — KAPPA AND LAMBDA FREE LIGHT CHAINS, SERUM
Kappa Light Chain, Serum, Free: 167 mg/L — ABNORMAL HIGH (ref 3.3–19.4)
Kappa/Lambda Ratio, Serum, Free: 14.03 — ABNORMAL HIGH (ref 0.26–1.65)
Lambda Light Chain, Serum, Free: 11.9 mg/L (ref 5.7–26.3)

## 2023-09-05 ENCOUNTER — Telehealth: Admit: 2023-09-11 | Discharge: 2023-09-11 | Payer: PRIVATE HEALTH INSURANCE | Attending: Nurse Practitioner

## 2023-09-05 DIAGNOSIS — C9000 Multiple myeloma not having achieved remission: Secondary | ICD-10-CM

## 2023-09-05 NOTE — Patient Instructions (Addendum)
Continue IPd    Monthly labs    We will let you know when neutrophils are low and require self injection    Recommend flu vaccine    Follow up with Harriett Sine in 4 weeks and Dr. Daphine Deutscher in 8 weeks

## 2023-09-05 NOTE — Progress Notes (Addendum)
Ariel Braun  [id number]    Date of Service: 09/05/2023    Reason for visit / Chief Complaints:    Subjective   Ariel Braun is a 62 y.o. female with MM who is on VIDEO follow-up    I performed this consultation using real-time Telehealth tools, including a live video connection between my location and the patient's location. Prior to initiating the consultation, I obtained informed verbal consent to perform this consultation using Telehealth tools and answered all the questions about the Telehealth interaction. Patient in [place].    Events since the last visit: The patient has been ON IPd therapy. Has been doing OK with intermittent neutropenia, requiring intermittent doses of Zarxio.  Now with rx for self injections  Occasional lightheadedness, intermittent skin itch, constipation (managed with dietary changes). Takes ALA for neuropathy ppx.  Thus far no neuropathy.     Noting skin hypopigmentation on arms x 1 year. No itch, no pain.     No current infectious symptoms. No "hot spots" or new bone pain. No significant signs of progressive MM. No kidney/urinary difficuties. No f/chills, URI symptoms, sinus congestion, sore throat or other infectious symptoms. Has been sheltering. No N/V/dysuria/diarrhea.  No CP, palp.  No bleeding or bruising.    Oncology history:  DX:   IgG kappa myeloma, diagnosed 09/07/10   - BMBx: 8% plasma cells, normal cyto 46XX, FISH showed only 3 copies 1q21, Stage 2 ISS (3.88 beta 2 microglobulin),IgG 4750, M-spike 3.2, kappa 586.9 mg/L.     TREATMENT  1. LINE1: RVD 10/2010 through April 2011 (? 4 cycles). Achieved VGPR with M-spike 0.4, IgG 886, kappa 27.6.   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 M-spike 0.6, IgG 1270.     3. LINE2: 08/05/14: Started Rev 15 mg 3/4. M-spike 1, IgG 1640, kappa 62.2 - SD    Biochemical PD 04/17/19: M-1.4, KLC 96.3. ? PD. Up Rev to 15 mg qd (3/4) and dex 8 mg wkly(4/4).       07/20/19: L  hip pain. M-1.2, KLC 96. Cont. Rev 15 mg (3/4)/dex 8 mg (4/4).     LINE4: Pom 3, Dara, Dex 8 (3/4).      Severe infection-sepsis 12/22 - all meds stopped. Admit [facility] 11/2021-01/2022  - CMV colitis, PE, Cholecystitis.  - Hospitalized in [facility] thru 6/2  - Hospitalized [facility] 6/14 - 7/13 - colitis/Noro    LINE5: Ixa/Pom/dex Cycle #1 07/2023       Past Medical History:   Diagnosis Date    Acid reflux disease     Anxiety     Depression     Hereditary angioedema (CMS code)     Multiple myeloma, without mention of having achieved remission     IgG kappa    Myeloma (CMS code) 05/10/2011     Past medical, family and social histories, as well as medications and allergies, were reviewed and updated in the medical record as appropriate.    Current Outpatient Medications   Medication Sig Dispense Refill    acyclovir (ZOVIRAX) 400 mg tablet Take 1 tablet (400 mg total) by mouth in the morning and 1 tablet (400 mg total) in the evening. 180 tablet 3    apixaban (ELIQUIS) 5 mg tablet Take 1 tablet (5 mg total) by mouth 2 (two) times daily      desvenlafaxine succinate (PRISTIQ) 25 mg 24 hr tablet TAKE 1 TABLET BY  MOUTH EVERY DAY IN THE MORNING 90 tablet 1    dexAMETHasone (DECADRON) 4 mg tablet Take 5 tablets (20 mg total) by mouth every 7 (seven) days TAKE 5 TABLETS (20MG ) WEEKLY PRIOR TO iXAZOMIB. DO NOT TAKE FOR WEEK OFF IXAZOMIB 40 tablet 0    filgrastim-aafi (NIVESTYM) 300 mcg/mL injection Inject 1 mL (300 mcg total) under the skin daily as needed (for absolute neutrophil coung less than 1) Anc<1 6 mL 0    ixazomib 3 mg CAP Take 3 mg by mouth every 7 (seven) days Take 3mg  on Day1, 8, 15 9 capsule 0    ondansetron (ZOFRAN) 8 mg tablet Take 1 tablet (8 mg total) by mouth every 8 (eight) hours as needed for Nausea      pomalidomide (POMALYST) 2 mg CAP Take 1 capsule (2 mg total) by mouth nightly at bedtime 21 days ON, followed by 7 days OFF 21 capsule 0     No current facility-administered medications for this visit.        ALLERGIES  Allergies/Contraindications   Allergen Reactions    Diphenoxylate-Atropine Abdominal Pain     Severe stomach pain     Caffeine Anxiety     "Jitters"       PHYSICAL EXAM- LIMIT EXAM BY VIDEO  Objective: ECOG1  Constitutional:  Pleasant over video  HENT:  Normocephalic, Neck- Supple.   SKIN: No rash noted Eyes:  PERRL, EOMI, sclera appear clear  Neurologic:  Alert & interactive, nl affect, No focal deficits noted.       RESULTS: Labs in [facility] were reviewed. Lab results reviewed and interpretation provided to patient  Lab Results   Component Value Date    WBC Count 3.3 (L) 08/30/2023    Hemoglobin 11.2 08/30/2023    Hematocrit 34.9 08/30/2023    MCV 92 08/30/2023    Platelet Count 177 08/30/2023     Lab Results   Component Value Date    Creatinine 0.60 08/30/2023    Creatinine (POCT) 0.8 06/05/2014         Outside Labs:    No results found.  ASSESSMENT & PLAN  1. Multiple Myeloma: Impression: established - stable - on therapy - requiring disease monitoring  This patient is on therapy and at high-risk for side effects and progression and requiring intensive monitoring. The patient's therapy is IPd (C1D1 = 07/06/23) - s/p 2 cycles - no dose adjustments.    We reviewed the labs: SIFE, SPEP, SFLC, CBC => m-prot 0.9, IgG 1356. KLC 167, LLC 11.9, Hgb 11,2, WBC 3.3, ANC 1.2  Since starting new therapy, M protein downtrended from 1.4, KLC stable.    May need PRN cytokines for neutropenia - has home Nivestym (biosimilaar of neupogen.. IgG level is slightly lower.      The patient has a high complexity problem: multiple myeloma, which is an incurable malignancy and the patient is receiving active therapy, which have side effects and poses a threat to life and bodily function.    Regarding data complexity, I have reviewed the following tests => SPEP, SIFE, SFLCs, CBC and CMP.      Medical Decision Making/ Plan: - Continue on IPd therapy   - Continue to follow myeloma protein levels every 4-6 weeks  - Follow-up in 4  weeks with Harriett Sine and 8 weeks with Dr. Daphine Deutscher    2. Immunocompromised state/Infectious Disease Concerns: Impression: established - immunocompromised from disease/treatment:  The patient is immunocompromised and is at high-risk from infection due to therapy.  Needs to be up-to-date on Covid vaccine. Has been sheltering from exposure. No exposures.    Requiring close follow-up for symptoms.  At risk for VZV reactivation and is on ACV. No current signs of infection. Will call with new signs of infection        Plan: - Continue prophylaxis  - monitor CMV monthly (08/30/23 CMV PCR neg)  - yearly flu vaccine - can get in October  - Shelter from [place] exposure - does not want new booster (history of diarrhea with prior covid vaccines)  - The patient will call with any symptoms or signs of infection    3. Depression/Psychologic/Social Stressors: Impression: established - stable  The patient has cancer and has situation depression and is at risk for anxiety. Needs close follow-up    Currently, the attitude is good. There are no signs of anxiety or active depression. Mood appropriate, understands plan      Plan: No additional medications needed    4. Anemia: Impression: established - stable    The blood counts are Good, follow. Next CBC in 2-4 weeks.       Plan: No transfusions or cytokines needed today.    5. Neutropenia/Thrombocytopenia: Impression: established - stable    The treatment has toxicity on the bone marrow - no current signs of suppression    Plan: No cytokines or transfusions    6. RI/ Electrolyte Replacement: Impression: established - stable     Taking ample fluids and avoiding nephrotoxins.  Lytes & Cr Okay, appears euvolemic. Appetite good and weight unchanged. No new medications needed.     Plan: Continue adequate fluid intake & balanced, nutritional diet   - Continue as much exercise as possible.  - No additional supplementation needed.    7. Osteopenia: Impression: established - stable:    The patient is at  risk for fracture due to MM. We recommend Calcium and Vit D replacement. We also recommend an aggressive exercise program.    Plan: - No  Biphosphonates    8. Secondary Cancer Risk: impression: established - NEEDS close follow-up  The patient has high-risk of secondary malignancies. Needs close follow-up and routine cancer screening.    Plan: - Derm follow-up yearly  - Colonoscopy every 5 years  - Screening per PCP.      Royal Piedra NP  Hematology, Blood & Marrow Transplant, and Cellular Therapy  [place] [facility]  office [phone]  fax [phone]  09/11/23     Patient Care Team:  Trudie Reed, DO as PCP - General (Family Medicine)  Hilary Hertz, MD (Hematology and Oncology)  Generic Provider Mychart  Marzella Schlein, MD (Palliative Care Medicine)  Larina Earthly III, MD (Hematology)  Mills Koller, NP as Nurse Practitioner (Family Medicine)  Bethann Berkshire, MD (Transplant Hepatology)    APP Visit Information:     APP Service Type:  Independent  Available MD consultant:  Larina Earthly III, MD       Problems: incurable malignancy, requires close monitoring for disease progression  Risk of complications, morbidity/mortality of patient management: high; the patient's systemic cancer therapy requires regular and intensive monitoring for potential major/life-threatening toxicities  Data review/interpretation: I reviewed and/or ordered 3+ tests and/or documents

## 2023-09-06 ENCOUNTER — Telehealth: Admit: 2023-09-08 | Payer: PRIVATE HEALTH INSURANCE | Attending: Transplant Hepatology

## 2023-09-06 DIAGNOSIS — K7460 Unspecified cirrhosis of liver: Secondary | ICD-10-CM

## 2023-09-06 NOTE — Progress Notes (Cosign Needed)
Ariel Braun is a 62 y.o. female seen for an follow-up consultation at the request of Dr. Samuella Cota for chronic liver disease restarting myeloma therapy.    I performed this evaluation using real-time telehealth tools, including a live video Zoom connection between my location and the patient's location. Prior to initiating, the patient consented to perform this evaluation using telehealth tools.    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 In 05/2022 during a hospitalization she had ongoing ascites requiring weekly LVP in the setting of protein caloric malnutrition s/p PEG-dependent on tube feeds. Hepatology was consulted for further management of her ascites, which was believed to be multifactorial but more driven by her malnutrition than her portal hypertension.     To review, history is notable for severe diarrhea secondary to norovirus treated with nitazoxanide x 2) complicated by hypovolemic shock and acute liver injury in 12/02/2021, transferred to [place] with prolonged hospital course (1/25-01/15/2022). Her transjugular liver biopsy on 12/11/21 showed mild HVPG of 8 mmHg and steatohepatitis with pericentral/sinusoidal and periportal fibrosis, along with ductal reaction with focal pericholangitis, suggestive of drug-induced liver injury in the setting of underlying MASH with possible culprit including thalidomide derivatives (pomalidomide) and dexamethasone (which can exacerbation of pre-existing MASH). Flexible sigmoidoscopy on 12/17/2021 showed CMV colitis and was treated with  ganciclovir followed by letemovir due to bone marrow suppression. 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 Her ascites fluid studies was notable for high SAAG >1.1 and low protein <1.5 suggestive of portal hypertension (mild elevated HVPG of 8 mmHg [WHVP 16 mmHg - FHVP 8 mmHg]on 12/11/21). She has no history of SBP. Her imaging with CTAP on 12/14/21 showed marked steatosis along with US doppler on 12/11/21 showing steatosis with portal vein diameter of 15 mm. US abdomen on 12/30/21 showed steatosis with normal surface contour. [place] with doppler on 05/16/22 showed mild liver surface nodularity compatible with early cirrhosis with sequela of portal hypertension including moderate ascites and splenomegaly.  She was discharged to a SNF for an extended period of time.    She has been off tube feeds since 06/2022 and has been at home since 09/2022, no longer needing home services. Her diarrhea has completely resolved. Her ascites and edema have resolved. She stopped rifaximin and ursodiol earlier in April.     Interval History: She was last seen in hepatology clinic in 05/2023 just prior to starting Ixazomib, cyclophosphamide and medrol for MM.    She started C1D1 07/06/23 and developed neutropenia 9/11 thus received zarxio x2. Liver enzymes showed mild ALP up to 140 9/18 which has since normalized, normal ALT, AST, total bilirubin.    She is very relieved to hear about her liver tests and reassured about safety of filgrastim and pristiq on her liver health.    Past Medical History:   Diagnosis Date    Acid reflux disease     Anxiety     Depression  Hereditary angioedema (CMS code)     Multiple myeloma, without mention of having achieved remission     IgG kappa    Myeloma (CMS code) 05/10/2011       Past Surgical  History:   Procedure Laterality Date    AUTOLOGOUS STEM CELL TRANSPLANTATION  05/12/11    BREAST CYST EXCISION  1981       Current Outpatient Medications   Medication Sig Dispense Refill    acyclovir (ZOVIRAX) 400 mg tablet Take 1 tablet (400 mg total) by mouth in the morning and 1 tablet (400 mg total) in the evening. 180 tablet 3    apixaban (ELIQUIS) 5 mg tablet Take 1 tablet (5 mg total) by mouth 2 (two) times daily      desvenlafaxine succinate (PRISTIQ) 25 mg 24 hr tablet TAKE 1 TABLET BY MOUTH EVERY DAY IN THE MORNING 90 tablet 1    dexAMETHasone (DECADRON) 4 mg tablet Take 5 tablets (20 mg total) by mouth every 7 (seven) days TAKE 5 TABLETS (20MG ) WEEKLY PRIOR TO iXAZOMIB. DO NOT TAKE FOR WEEK OFF IXAZOMIB 40 tablet 0    filgrastim-aafi (NIVESTYM) 300 mcg/mL injection Inject 1 mL (300 mcg total) under the skin daily as needed (for absolute neutrophil coung less than 1) Anc<1 6 mL 0    ixazomib 3 mg CAP Take 3 mg by mouth every 7 (seven) days Take 3mg  on Day1, 8, 15 9 capsule 0    ondansetron (ZOFRAN) 8 mg tablet Take 1 tablet (8 mg total) by mouth every 8 (eight) hours as needed for Nausea      pomalidomide (POMALYST) 2 mg CAP Take 1 capsule (2 mg total) by mouth nightly at bedtime 21 days ON, followed by 7 days OFF 21 capsule 0     No current facility-administered medications for this visit.       Allergies: She is allergic to diphenoxylate-atropine and caffeine.    Social History:   Social History     Socioeconomic History    Marital status: Married     Spouse name: Not on file    Number of children: Not on file    Years of education: Not on file    Highest education level: Not on file   Occupational History    Not on file   Tobacco Use    Smoking status: Unknown    Smokeless tobacco: Not on file   Substance and Sexual Activity    Alcohol use: Never    Drug use: Never    Sexual activity: Not Currently   Other Topics Concern    Not on file   Social History Narrative    Not on file     Social Determinants of  Health     Financial Resource Strain: Low Risk  (02/28/2023)    Overall Financial Resource Strain (CARDIA)     Difficulty of Paying Living Expenses: Not hard at all   Food Insecurity: No Food Insecurity (02/28/2023)    Hunger Vital Sign     Worried About Running Out of Food in the Last Year: Never true     Ran Out of Food in the Last Year: Never true   Transportation Needs: No Transportation Needs (02/28/2023)    PRAPARE - Therapist, art (Medical): No     Lack of Transportation (Non-Medical): No   Housing Stability: Low Risk  (02/28/2023)    Housing Stability Vital Sign     Unable to Pay for  Housing in the Last Year: No     Number of Places Lived in the Last Year: 1     Unstable Housing in the Last Year: No       Family History: Her family history includes Colon cancer in her father; Diabetes in her mother; Hypertension in her brother and mother.  Family history is otherwise negative or as noted above.     Physical Exam:  Vital Signs: There were no vitals taken for this visit.  Constitutional: Patient appears well-developed and well-nourished. Pleasant and appropriately interactive.   Eyes: Anicteric sclera  Pulmonary/Chest: Effort normal. No respiratory distress. No cough.  Neurological: Alert and oriented to person, place, and time.  Skin: No rash.     Records Review: I have personally reviewed the patient's medical records. These are summarized within the history of present illness.    Labs: I have personally reviewed and interpreted the following laboratory studies:    CIRRHOSIS MANAGEMENT          Latest Ref Rng & Units 06/28/2023    10:31 07/12/2023    07:23 07/20/2023    07:45 07/27/2023    07:49 08/01/2023    08:12 08/03/2023    15:12 08/10/2023    08:27 08/17/2023    09:11   Labs   WBC Count 3.4 - 10.8 x10E3/uL 4.2  3.3  3.5  2.4  2.8  1.9  3.8  2.9    RBC Count 3.77 - 5.28 x10E6/uL 4.08  3.94  3.89  3.82  3.70  4.13  4.01  3.94    Hemoglobin 11.1 - 15.9 g/dL 86.5  78.4  69.6  29.5  10.9  12.4   12.1  11.8    Hematocrit 34.0 - 46.6 % 36.1  35.8  35.8  34.7  33.9  38.7  36.8  36.8    MCV 79 - 97 fL 89  91  92  91  92  94  92  93    MCH 26.6 - 33.0 pg 29.7  30.2  29.8  30.1  29.5  30.0  30.2  29.9    MCHC 31.5 - 35.7 g/dL 28.4  13.2  44.0  10.2  32.2  32.0  32.9  32.1    Platelet Count 150 - 450 x10E3/uL 140  136  130  139  147  170  158  151    Sodium 134 - 144 mmol/L 141  141  139  142  142  141  142  141    Creatinine 0.57 - 1.00 mg/dL 7.25  3.66  4.40  3.47  0.64  0.58  0.62  0.60    Bilirubin, Total 0.0 - 1.2 mg/dL 0.8  0.8  0.7  0.8  0.5  0.7  1.0  0.8    Albumin, Serum / Plasma 3.9 - 4.9 g/dL 4.2  4.0  4.0  3.9  3.7  4.6  4.2  3.9          08/25/2023    08:32 08/30/2023    08:39   Labs   WBC Count 2.1  3.3    RBC Count 3.83  3.79    Hemoglobin 11.5  11.2    Hematocrit 35.8  34.9    MCV 94  92    MCH 30.0  29.6    MCHC 32.1  32.1    Platelet Count 148  177    Sodium 141  143    Creatinine 0.70  0.60  Bilirubin, Total 0.7  0.7    Albumin, Serum / Plasma 3.8  4.0      .AMB SYNOPSIS NAFLD MANAGEMENT          Latest Ref Rng & Units 06/28/2023    10:31 07/12/2023    07:23 07/20/2023    07:45 07/27/2023    07:49 08/01/2023    08:12 08/03/2023    15:12 08/10/2023    08:27 08/17/2023    09:11   Labs   WBC Count 3.4 - 10.8 x10E3/uL 4.2  3.3  3.5  2.4  2.8  1.9  3.8  2.9    RBC Count 3.77 - 5.28 x10E6/uL 4.08  3.94  3.89  3.82  3.70  4.13  4.01  3.94    Hemoglobin 11.1 - 15.9 g/dL 23.5  57.3  22.0  25.4  10.9  12.4  12.1  11.8    Hematocrit 34.0 - 46.6 % 36.1  35.8  35.8  34.7  33.9  38.7  36.8  36.8    MCV 79 - 97 fL 89  91  92  91  92  94  92  93    MCH 26.6 - 33.0 pg 29.7  30.2  29.8  30.1  29.5  30.0  30.2  29.9    MCHC 31.5 - 35.7 g/dL 27.0  62.3  76.2  83.1  32.2  32.0  32.9  32.1    Platelet Count 150 - 450 x10E3/uL 140  136  130  139  147  170  158  151    Creatinine 0.57 - 1.00 mg/dL 5.17  6.16  0.73  7.10  0.64  0.58  0.62  0.60    AST 0 - 40 IU/L 31  19  22  24  21  31  21  20     ALT 0 - 32 IU/L 25  17  19   25  21  28  20  18     Bilirubin, Total 0.0 - 1.2 mg/dL 0.8  0.8  0.7  0.8  0.5  0.7  1.0  0.8    Alkaline Phosphatase 44 - 121 IU/L 125  119  121  130  120  140  129  116    Albumin, Serum / Plasma 3.9 - 4.9 g/dL 4.2  4.0  4.0  3.9  3.7  4.6  4.2  3.9          08/25/2023    08:32 08/30/2023    08:39   Labs   WBC Count 2.1  3.3    RBC Count 3.83  3.79    Hemoglobin 11.5  11.2    Hematocrit 35.8  34.9    MCV 94  92    MCH 30.0  29.6    MCHC 32.1  32.1    Platelet Count 148  177    Creatinine 0.70  0.60    AST 20  21    ALT 19  18    Bilirubin, Total 0.7  0.7    Alkaline Phosphatase 115  116    Albumin, Serum / Plasma 3.8  4.0      Serologies:  Hep A IgG reactive (12/11/2021)  HBsAg negative (12/11/2021)  Anti-HBs positive (12/11/2021)  Anti-HBc negative (12/11/2021)  HIV negative (12/15/2021)  HCV Ab negative (02/12/2011), negative (12/11/2021)    Studies: I have reviewed the following studies:    PET CT 01/18/2023: Impression:  -Stable appearance of mixed lytic and sclerotic lesions within  the thoracolumbar spine and pelvis. No evidence for interval hypermetabolic skeletal pathology  -Stable nodular thyroid with interval decrease in metabolic activity  -Inflammatory changes within the lower lobes of both lungs     05/16/2022: IMPRESSION:   1.  Mild liver surface nodularity compatible with early cirrhosis and/or fibrosis. Patent vasculature with normal direction of flow.  2.  Sequela of portal hypertension including increased now moderate ascites and splenomegaly.  3.  Cholelithiasis.    Liver biopsy 12/11/2021: FINAL PATHOLOGIC DIAGNOSIS   Liver, transjugular biopsy:  1.  Steatohepatitis with pericentral/sinusoidal and periportal fibrosis; see comment.  2.  Ductular reaction with focal pericholangitis; see comment.  COMMENT:  The biopsy shows severe steatosis with pericentral/sinusoidal and  periportal fibrosis, histiocytic and neutrophilic lobular inflammation,  Mallory hyaline as well as rare ballooned hepatocytes. The  findings are  consistent with steatohepatitis (stage 2, scale 0-4, NASH-CRN method).  Clinical correlation with steatohepatitis risk factors is suggested,  including for any history of massive weight loss which may lead to an  aggressive onset of steatohepatitis [1].  The history of dexamethasone  use is noted; corticosteroid use has been associated with exacerbation  of pre-existing fatty liver disease [2].     Additionally, there is ductular reaction with focal pericholangitis,  raising consideration for a duct obstructive process. Clinical  correlation is advised. The concern for other drug-induced liver injury  is noted. In particular, thalidomide derivatives (such as pomalidomide)  have been reported in association with vanishing bile duct syndrome  (VBDS) [3]; however, there is no convincing duct loss to suggest VBDS.  There are no clusters of plasma cells seen to suggest involvement by  myeloma.    Microscopic findings  Adequacy: 9 portal tracts.   Portal tracts: Mild portal inflammation with histiocytes and lymphocytes, no interface activity, mild ductular reaction with focal pericholangitis. Interlobular bile ducts show mild duct injury without  overt duct loss.   Hepatic parenchyma: Mild lobular inflammation with histiocytes and neutrophils, rare spotty hepatocellular injury/necrosis, severe steatosis with neutrophilic satellitosis (grade 3, scale 0-3), rare  hepatocellular ballooning, extensive Mallory hyaline deposition, rare cholestasis, no granulomas, no viropathic changes, and no sinusoidal changes.    Immunohistochemical and histochemical stains (block A1):  - Trichrome:      Highlights mild to moderate pericellular/sinusoidal fibrosis and periportal fibrosis (stage 2, scale 0-4,NASH-CRN method).  - Iron:  1+ focal staining in scattered Kupffer cells.  - PASD:  No cytoplasmic globules typical of alpha-1-antitrypsin deficiency.  - CK8/18: Highlights Mallory hyaline within hepatocytes.  - CK7:  Highlights mild ductular reaction and intact interlobular bile ducts. No periportal hepatocyte staining to support chronic cholestatic effect.  - CD68: Highlights scattered portal and lobular macrophages. No staining in hepatocytes with Mallory hyaline (indicates that these cells are not macrophages).  - [place] Red:     Negative, no support for hepatic amyloidosis.     ASSESSMENT AND PLAN:  MANEET MCDUFFEY is a 62 y.o. 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    Chronic liver disease: Stage 2 fibrosis with Steatohepatitis and Ductular reaction with focal pericholangitis on  liver biopsy in 11/2021. Her liver enzymes have normalized and ascites resolved with improvement in nutrition/albumin. Agree with holding the rifaximin and the ursodiol.     Multiple myeloma: Planning to start Ixazomib, Cytoxan, Medrol. Reviewed in [facility] liver tox:  Ixazomib: In large clinical trials of ixazomib combined with lenalidomide and dexamethasone, elevations in serum aminotransferase levels were common, occurring in ~10% of patients. However, values greater than 5 times the upper limit of normal (ULN) were rare, occurring in <1% of recipients. Cases of clinically apparent liver injury including acute liver failure have been reported in patients receiving ixazomib, however in many instances multiple concomitant medications were being taken and the specific role of ixazomib in causing the liver injury was not always clear. The clinical features of cases of liver injury attributed to  ixazomib have not been defined in any detail. Hepatotoxicity is listed as a warning in the product label for ixazomib and monitoring of serum enzymes during treatment is recommended.  Cyclophosphamide: Mild and transient elevations in serum aminotransferase levels are found in up to 43% of patients with cancer who are treated with cyclophosphamide. The abnormalities are generally asymptomatic and transient and do not require dose modification. Enzyme elevations are more common with higher doses and with intravenous therapy. In some instances, marked elevations arise warranting dose modification or discontinuation of cyclophosphamide. Clinically apparent liver injury from standard doses of cyclophosphamide is uncommon, but several case reports of acute liver injury with jaundice have been published. The onset is within 2 to 8 weeks of starting cyclophosphamide and the pattern of serum enzyme elevations is hepatocellular. Immunoallergic and autoimmune features are uncommon. The injury in most cases is self-limited and resolves within 1 to 3 months of stopping; however, fatal instances have been reported. Recurrence on reexposure has been described.  Medrol: Corticosteroid therapy can cause hepatic steatosis and hepatic enlargement, but this is often not clinically apparent, particularly in adults. This effect can occur quite rapidly and is rapidly reversed with discontinuation. High doses and long term use has been associated with the development or exacerbation of nonalcoholic steatohepatitis with elevations in serum aminotransferase levels and liver histology resembling alcoholic hepatitis with steatosis, chronic inflammation, centrolobular ballooning degeneration and Mallory bodies. However, symptomatic or progressive liver injury from corticosteroid induced steatohepatitis is uncommon. Furthermore, corticosteroids may act to worsen an underlying nonalcoholic fatty liver disease rather than causing the condition de  novo. The worsening may be due to direct effects of glucocorticoids on insulin resistance or fatty acid metabolism or may be the result of weight gain which is common with long term corticosteroid therapy. While simple steatosis induced by corticosteroids is rapidly reversible, steatohepatitis can be slow to resolve upon withdrawal of corticosteroids. She has now started treatment and reassuringly liver tests remain normal    Concerns about mirtazepine and desvenlafaxine: Both have low risk of clinically significant liver injury. Mirtazepine is associated with elevated liver enzymes in ~10% of cases vs 1% with desvenlafaxine, but she has been stable on mirtazepine for several months. Because she also had concerns about other toxicities with mirtazapine, recommended desvenlafaxine from a liver perspective, which she has been tolerating well.    Surveillance for hepatocellular carcinoma: Liver biopsy in 11/2021 showed F2 fibrosis, but imaging in July 2023 concerning for advanced fibrosis (complicated by multifactorial ascites). Discussed with patient pros/cons of monitoring for [facility]- decided to proceed with q6 month surveillance. I have ordered an AFP and abdominal U/S.     Immunity: She is hepatitis A and B  immune. Anti-HBc negative.    Follow-up: as needed

## 2023-09-14 LAB — COMPLETE BLOOD COUNT WITH DIFFERENTIAL
% Basophils: 2 %
% Eosinophils: 6 %
% Lymphocytes: 46 %
% Monocytes: 12 %
% Neutrophils: 34 %
% nrbc: 0 % (ref 0–0)
Abs Basophils: 0.1 10*3/uL (ref 0.0–0.2)
Abs Eosinophils: 0.2 10*3/uL (ref 0.0–0.4)
Abs Lymphocytes: 1.5 10*3/uL (ref 0.7–3.1)
Abs Monocytes: 0.4 10*3/uL (ref 0.1–0.9)
Abs Neutrophils: 1.1 10*3/uL — ABNORMAL LOW (ref 1.4–7.0)
Hematocrit: 35.9 % (ref 34.0–46.6)
Hemoglobin: 11.6 g/dL (ref 11.1–15.9)
MCH: 30.1 pg (ref 26.6–33.0)
MCHC: 32.3 g/dL (ref 31.5–35.7)
MCV: 93 fL (ref 79–97)
Platelet Count: 145 10*3/uL — ABNORMAL LOW (ref 150–450)
RBC Count: 3.85 x10E6/uL (ref 3.77–5.28)
RDW-CV: 15.9 % — ABNORMAL HIGH (ref 11.7–15.4)
WBC Count: 3.3 10*3/uL — ABNORMAL LOW (ref 3.4–10.8)

## 2023-09-14 LAB — COMPREHENSIVE METABOLIC PANEL (BMP, AST, ALT, T.BILI, ALKP, TP ALB)
AST: 18 [IU]/L (ref 0–40)
Alanine transaminase: 19 [IU]/L (ref 0–32)
Albumin, Serum / Plasma: 3.9 g/dL (ref 3.9–4.9)
Alkaline Phosphatase: 115 [IU]/L (ref 44–121)
BUN/Creatinine Ratio (External Lab): 26 (ref 12–28)
Bilirubin, Total: 0.9 mg/dL (ref 0.0–1.2)
Calcium, total, Serum / Plasma: 9.2 mg/dL (ref 8.7–10.3)
Carbon Dioxide, Total: 24 mmol/L (ref 20–29)
Chloride, Serum / Plasma: 107 mmol/L — ABNORMAL HIGH (ref 96–106)
Creatinine: 0.57 mg/dL (ref 0.57–1.00)
Globulin, Total: 2.2 g/dL (ref 1.5–4.5)
Glucose, non-fasting: 84 mg/dL (ref 70–99)
Potassium, Serum / Plasma: 4.2 mmol/L (ref 3.5–5.2)
Protein, Total, Serum / Plasma: 6.1 g/dL (ref 6.0–8.5)
Sodium, Serum / Plasma: 142 mmol/L (ref 134–144)
Urea Nitrogen, serum/plasma: 15 mg/dL (ref 8–27)
eGFRcr: 102 mL/min/{1.73_m2} (ref >59)

## 2023-09-14 LAB — LACTATE DEHYDROGENASE, BLOOD: LDH, serum/plasma: 146 [IU]/L (ref 119–226)

## 2023-09-15 MED ORDER — POMALYST 2 MG CAPSULE
2 mg | ORAL_CAPSULE | Freq: Every day | ORAL | 0 refills | Status: DC
Start: 2023-09-15 — End: 2023-10-18

## 2023-09-18 IMAGING — MR RM PELVE COM CONTRASTE (não inclui articulações coxofemorais)
21 of 37 series · 21 of 37 positions shown · non-contrast
Comparison: none

[Series 2: DWI · axial · 5.0mm · 1.37mm/px · 1 of 60 slices shown]
[im 1/60]
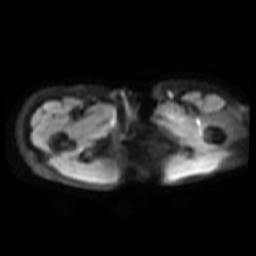

[Series 4: T2 · coronal · 5.0mm · 0.74mm/px · 1 of 25 slices shown (1 of 5)]
[im 1/25]
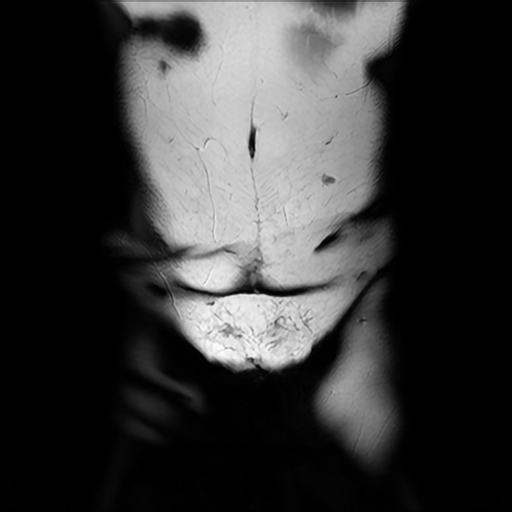

[Series 6: T2 · sagittal · 4.0mm · 0.55mm/px · 1 of 29 slices shown (2 of 5)]
[im 1/29]
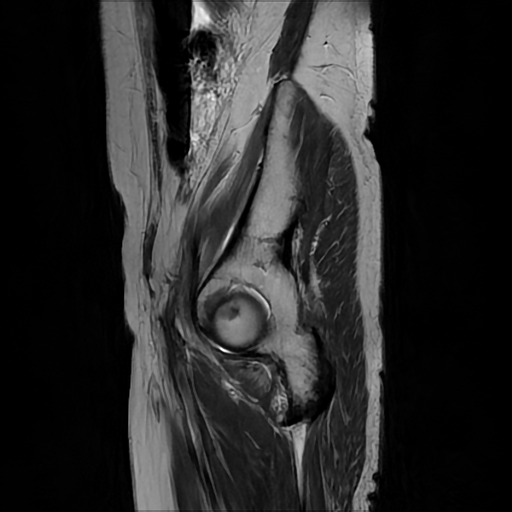

[Series 8: T1 dynamic · axial · non-contrast · 3.9mm · 0.72mm/px · 1 of 110 slices shown (1 of 5)]
[im 1/110]
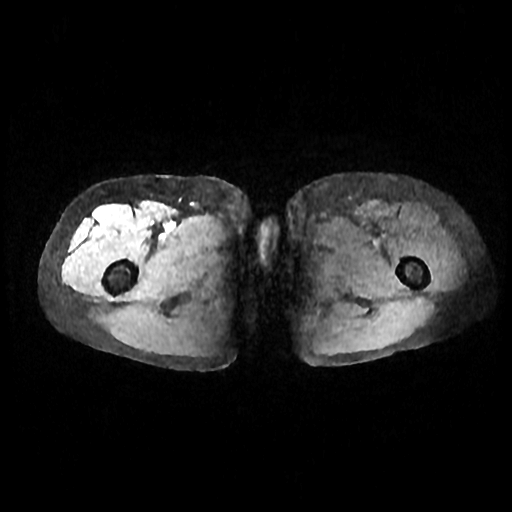

[Series 10: T2 · coronal · 5.5mm · 0.86mm/px · 1 of 27 slices shown (3 of 5)]
[im 1/27]
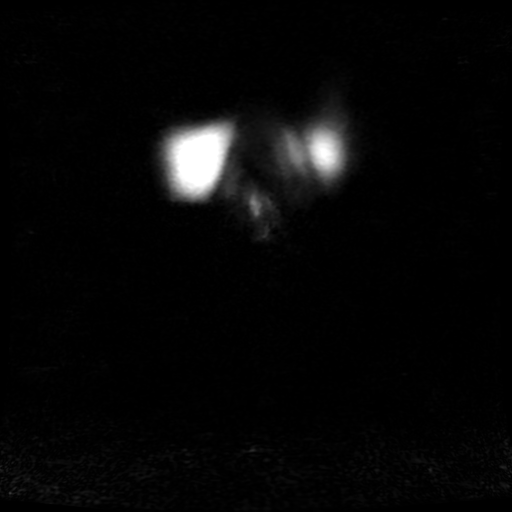

[Series 12: T2 · axial · 7.0mm · 0.82mm/px · 1 of 32 slices shown (4 of 5)]
[im 1/32]
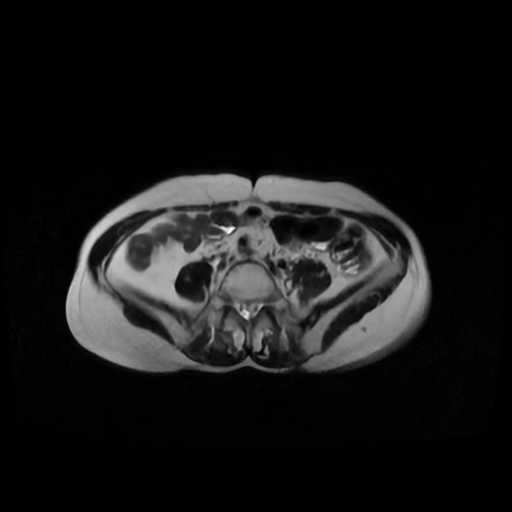

[Series 14: T2 · coronal · 5.5mm · 0.86mm/px · 1 of 27 slices shown (5 of 5)]
[im 1/27]
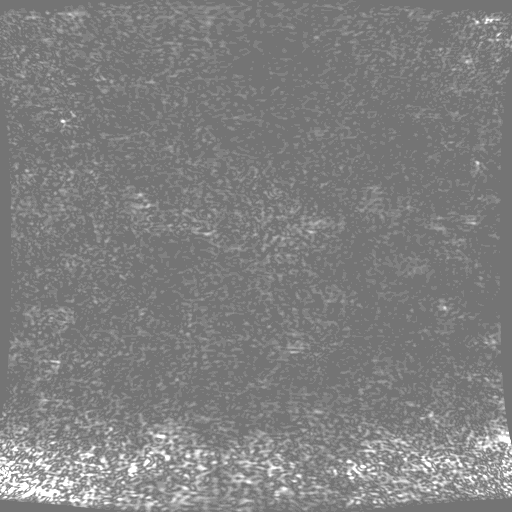

[Series 16: ax 2d dualecho · axial · 7.0mm · 0.82mm/px · 1 of 64 slices shown]
[im 1/64]
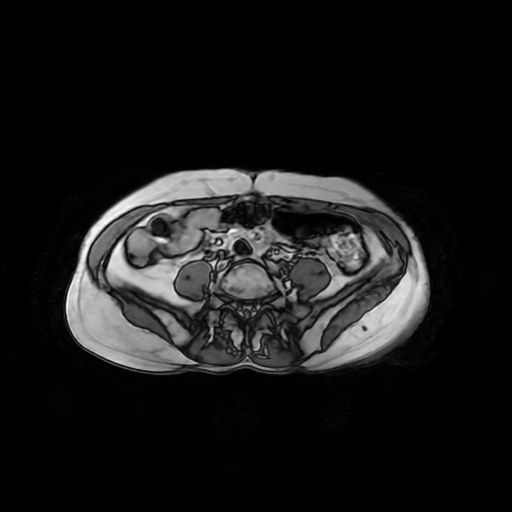

[Series 18: T1 dynamic · sagittal · 4.7mm · 0.59mm/px · 1 of 70 slices shown (2 of 5)]
[im 1/70]
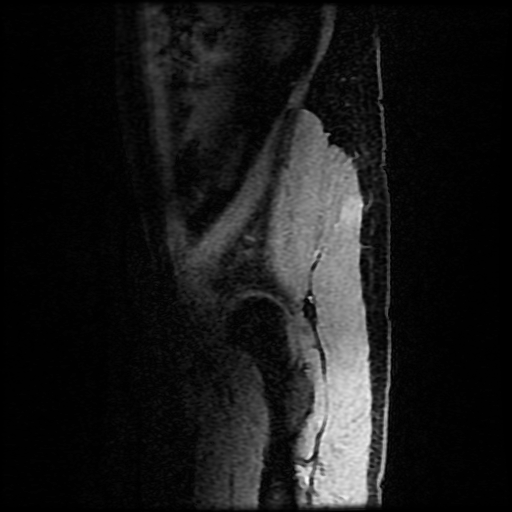

[Series 20: T1 dynamic · axial · 4.7mm · 0.68mm/px · 1 of 78 slices shown (3 of 5)]
[im 1/78]
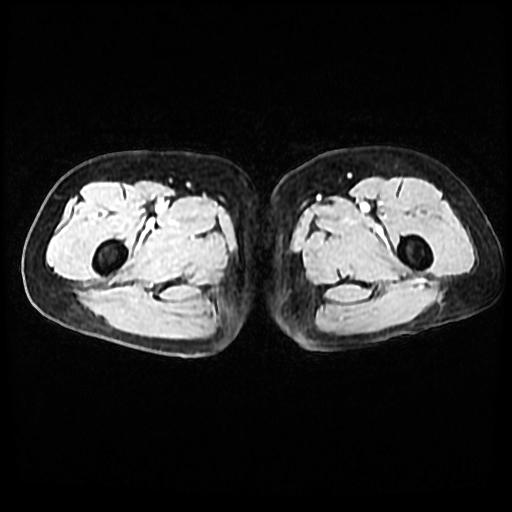

[Series 251: eadc · axial · 5.0mm · 1.37mm/px · 1 of 30 slices shown (1 of 2)]
[im 1/30]
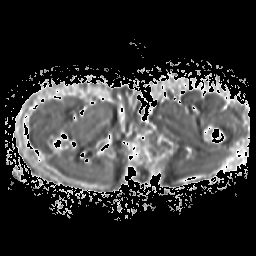

[Series 1151: eadc · axial · 7.0mm · 1.64mm/px · 1 of 32 slices shown (2 of 2)]
[im 1/32]
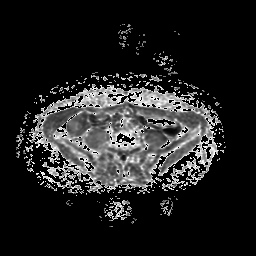

[Series 1701: T1 dynamic · axial · 4.4mm · 0.78mm/px · 1 of 122 slices shown (4 of 5)]
[im 1/122]
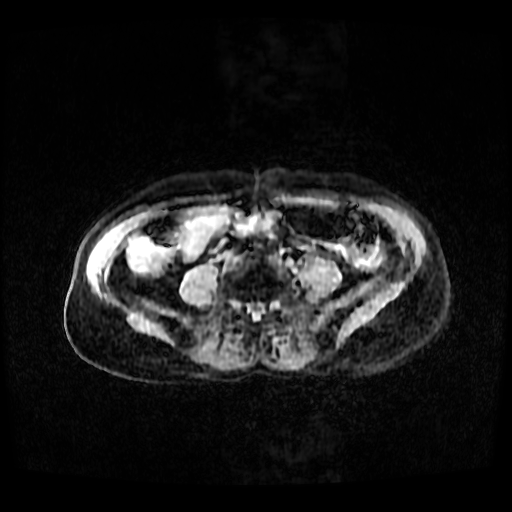

[Series 1702: T1 dynamic · axial · 4.4mm · 0.78mm/px · 1 of 122 slices shown (5 of 5)]
[im 1/122]
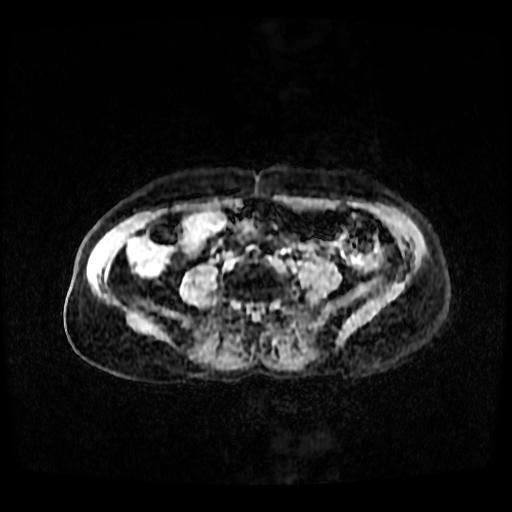

[((id)/(id)/1)-((id)/(id)/1) · axial · 4.4mm · 0.78mm/px · 1 of 122 slices shown (1 of 7)]
[im 1/122]
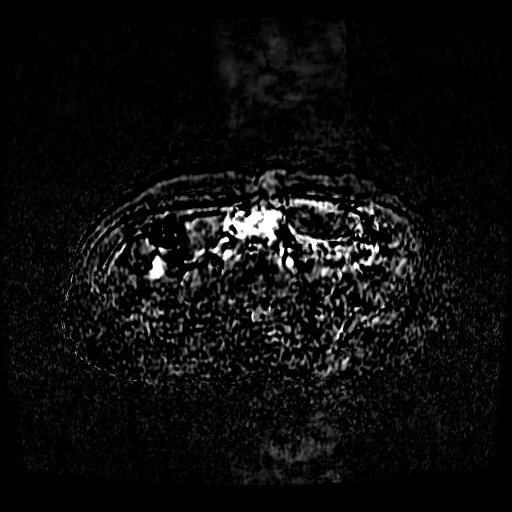

[((id)/(id)/1)-((id)/(id)/1) · axial · 4.4mm · 0.78mm/px · 1 of 122 slices shown (2 of 7)]
[im 1/122]
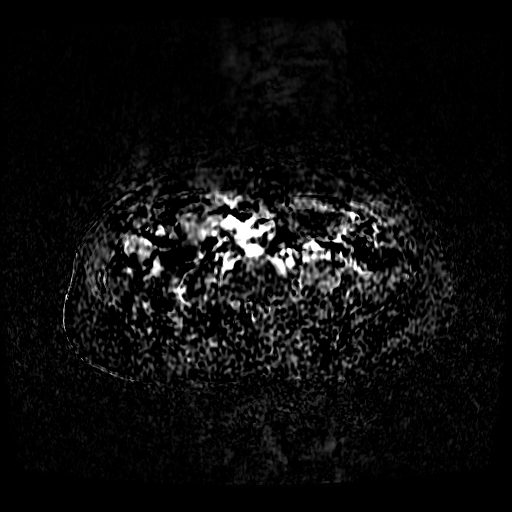

[((id)/(id)/1)-((id)/(id)/1) · axial · 4.4mm · 0.78mm/px · 1 of 122 slices shown (3 of 7)]
[im 1/122]
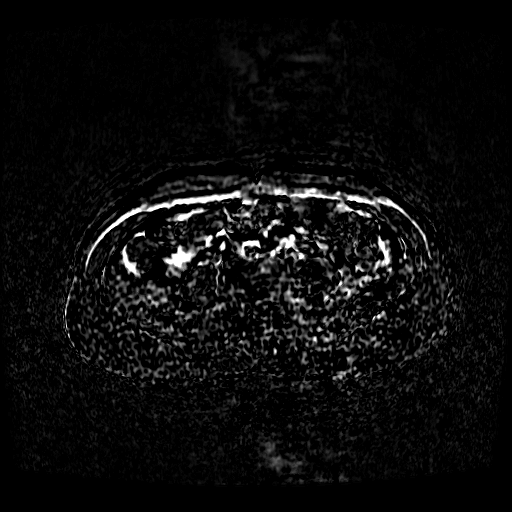

[((id)/(id)/1)-((id)/(id)/1) · axial · 4.4mm · 0.78mm/px · 1 of 122 slices shown (4 of 7)]
[im 1/122]
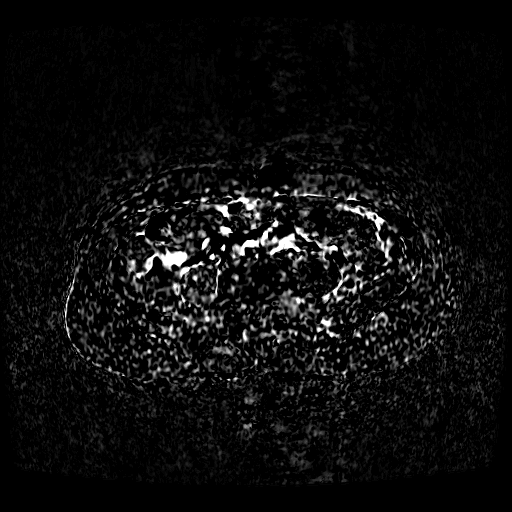

[((id)/(id)/1)-((id)/(id)/1) · axial · 4.4mm · 0.78mm/px · 1 of 122 slices shown (5 of 7)]
[im 1/122]
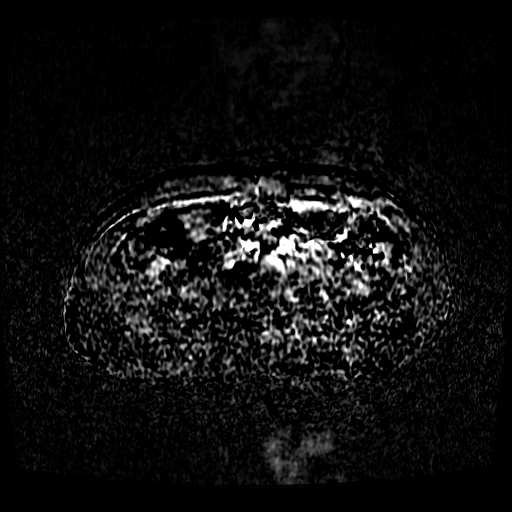

[((id)/(id)/1)-((id)/(id)/1) · axial · 4.4mm · 0.78mm/px · 1 of 122 slices shown (6 of 7)]
[im 1/122]
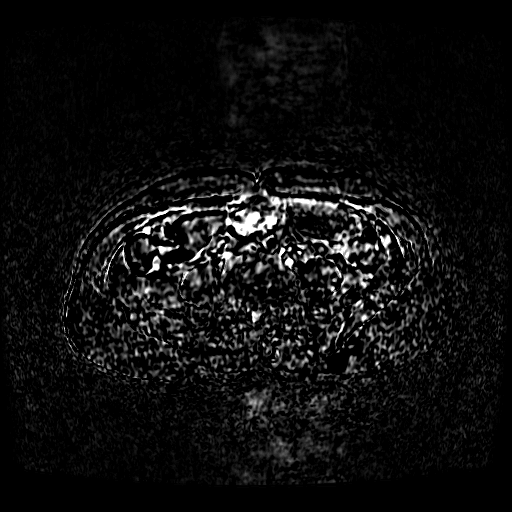

[((id)/(id)/1)-((id)/(id)/1) · axial · 4.4mm · 0.78mm/px · 1 of 122 slices shown (7 of 7)]
[im 1/122]
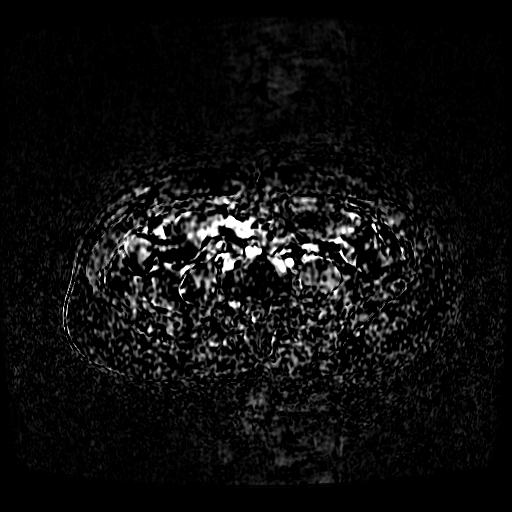

[21 of 37 positions shown; findings below may reference images not displayed]

RESSONÂNCIA MAGNÉTICA DE PELVE FEMININA

TÉCNICA:
Exame realizado em equipamento de ressonância magnética com sequências, ponderações e planos específicos para o segmento de interesse, antes e após a administração endovenosa do meio de contraste.

RESULTADO:
Realizado estudo comparativo com o exame do dia 03/21/2023.
Estruturas ósseas e musculatura regional integras. 
Restrito útero e bexiga não identificados. Implantação dos ureteres no cólon sigmóide. Persisteimagem de aspecto cístico multiseptada localizada na regiãoanexial direita, medindo 3,9 x 2,3 cm (media 3,4 x 1,8 cm ). 
Persiste espessamento tecidual com marcado hipossinal em T2 de aspecto fibro retrátil no assoalho pélvico à direita, associado à espessamento também com hipossinal na fáscia mesorretal adjacente, determinando tração sobre a imagem cística anexial direita acima descrita, bem como sobre alças ileais e reto/ sigmóide. 
Discreto espessamento parietal do reto e sigmóide, sugestivo de alterações actínicas. Espessamento parietal do terço distal vaginal, sem difusão restrita, podendo estar relacionado a terapêutica empregada.
Ausência de linfonodomegalias ou coleções líquidas na pelve.

CONCLUSÃO: 
Estudo comparativo com achados acima pormenorizados.

## 2023-09-28 NOTE — Progress Notes (Signed)
09/28/23 ANC 0.9. Patient advised to self inject home Nivestym and continue myeloma oral therapy.

## 2023-09-29 LAB — COMPLETE BLOOD COUNT WITH DIFFERENTIAL
% Basophils: 0 %
% Eosinophils: 3 %
% Lymphocytes: 50 %
% Monocytes: 16 %
% Neutrophils: 31 %
Abs Basophils: 0 10*3/uL (ref 0.0–0.2)
Abs Eosinophils: 0.1 10*3/uL (ref 0.0–0.4)
Abs Lymphocytes: 1.4 10*3/uL (ref 0.7–3.1)
Abs Monocytes: 0.4 10*3/uL (ref 0.1–0.9)
Abs Neutrophils: 0.9 10*3/uL — ABNORMAL LOW (ref 1.4–7.0)
Hematocrit: 36 % (ref 34.0–46.6)
Hemoglobin: 11.9 g/dL (ref 11.1–15.9)
MCH: 29.8 pg (ref 26.6–33.0)
MCHC: 33.1 g/dL (ref 31.5–35.7)
MCV: 90 fL (ref 79–97)
Platelet Count: 180 10*3/uL (ref 150–450)
RBC Count: 3.99 x10E6/uL (ref 3.77–5.28)
RDW-CV: 15.6 % — ABNORMAL HIGH (ref 11.7–15.4)
WBC Count: 2.8 10*3/uL — ABNORMAL LOW (ref 3.4–10.8)

## 2023-09-29 LAB — COMPREHENSIVE METABOLIC PANEL (BMP, AST, ALT, T.BILI, ALKP, TP ALB)
AST: 19 [IU]/L (ref 0–40)
Alanine transaminase: 18 [IU]/L (ref 0–32)
Albumin, Serum / Plasma: 4.2 g/dL (ref 3.9–4.9)
Alkaline Phosphatase: 133 [IU]/L — ABNORMAL HIGH (ref 44–121)
BUN/Creatinine Ratio (External Lab): 29 — ABNORMAL HIGH (ref 12–28)
Bilirubin, Total: 0.7 mg/dL (ref 0.0–1.2)
Calcium, total, Serum / Plasma: 9.2 mg/dL (ref 8.7–10.3)
Carbon Dioxide, Total: 25 mmol/L (ref 20–29)
Chloride, Serum / Plasma: 108 mmol/L — ABNORMAL HIGH (ref 96–106)
Creatinine: 0.63 mg/dL (ref 0.57–1.00)
Globulin, Total: 2.2 g/dL (ref 1.5–4.5)
Glucose, non-fasting: 86 mg/dL (ref 70–99)
Potassium, Serum / Plasma: 4.2 mmol/L (ref 3.5–5.2)
Protein, Total, Serum / Plasma: 6.4 g/dL (ref 6.0–8.5)
Sodium, Serum / Plasma: 142 mmol/L (ref 134–144)
Urea Nitrogen, serum/plasma: 18 mg/dL (ref 8–27)
eGFRcr: 100 mL/min/{1.73_m2} (ref >59)

## 2023-09-30 LAB — PROTEIN ELECTROPHORESIS, SERUM
A/G Ratio: 0.9 (ref 0.7–1.7)
Albumin, sPEP: 3.1 g/dL (ref 2.9–4.4)
Alpha 1 Globulin: 0.3 g/dL (ref 0.0–0.4)
Alpha 2 Globulin: 0.7 g/dL (ref 0.4–1.0)
Beta Globulin: 1.1 g/dL (ref 0.7–1.3)
Gamma Globulin: 1.3 g/dL (ref 0.4–1.8)
Globulin, Total: 3.4 g/dL (ref 2.2–3.9)
M-Protein (monoclonal), Serum: 1 g/dL — ABNORMAL HIGH
Protein, Total, Serum / Plasma: 6.5 g/dL (ref 6.0–8.5)

## 2023-09-30 LAB — IMMUNOFIXATION ELECTROPHORESIS, SERUM
IgA, Serum: 66 mg/dL — ABNORMAL LOW (ref 87–352)
IgG: 1314 mg/dL (ref 586–1602)
IgM, Serum: 48 mg/dL (ref 26–217)

## 2023-09-30 LAB — KAPPA AND LAMBDA FREE LIGHT CHAINS, SERUM
Kappa Light Chain, Serum, Free: 179.3 mg/L — ABNORMAL HIGH (ref 3.3–19.4)
Kappa/Lambda Ratio, Serum, Free: 18.3 — ABNORMAL HIGH (ref 0.26–1.65)
Lambda Light Chain, Serum, Free: 9.8 mg/L (ref 5.7–26.3)

## 2023-10-01 LAB — CYTOMEGALOVIRUS DNA, QUANTITATIVE PCR, PLASMA: Cytomegalovirus DNA, Quantitative PCR: NEGATIVE [IU]/mL

## 2023-10-03 ENCOUNTER — Telehealth: Admit: 2023-10-04 | Payer: PRIVATE HEALTH INSURANCE | Attending: Nurse Practitioner

## 2023-10-03 DIAGNOSIS — C9000 Multiple myeloma not having achieved remission: Secondary | ICD-10-CM

## 2023-10-03 NOTE — Patient Instructions (Addendum)
I ordered an urgent PET scan to be done at [facility] imaging [place] - please await communication from our auth team    Continue Ixazomib/Pom/Dex and daily aspirin at this time    Please repeat cbc this week     Follow up with Dr. Daphine Deutscher next month.  Monthly myeloma labs.

## 2023-10-03 NOTE — Progress Notes (Addendum)
Ariel Braun  [id number]    Date of Service: 10/03/2023    Reason for visit / Chief Complaints:    Subjective   Ariel Braun is a 62 y.o. female with MM who is on VIDEO follow-up      Events since the last visit: The patient has been ON IPd therapy. Has been doing OK with intermittent neutropenia, requiring intermittent doses of Zarxio.  Now with home Nivestym injection.  C4D1 started 09/28/23.    Self injected Nivestym 11/15, then pain started day after injection in her back, between shoulder blades. Painful to lay on right side.  She has never had such a reaction after GCSF, so afraid to self inject again.  She prefers to drive to the clinic for zarxio if need be.    Also new hip pain R>L.    Fatigue and occasional constipation, otherwise tolerating oral therapy ok.    Occasional lightheadedness, intermittent skin itch, constipation (managed with dietary changes). Takes ALA for neuropathy ppx.  Thus far no neuropathy.     Noting skin hypopigmentation on arms x 1 year. No itch, no pain.     Continues to take care of her diabetic husband, son with brain tumor and helping with her daughter who has been quite ill for >1 year and with no known diagnosis to date.    No current infectious symptoms. No "hot spots" or new bone pain. No significant signs of progressive MM. No kidney/urinary difficuties. No f/chills, URI symptoms, sinus congestion, sore throat or other infectious symptoms. Has been sheltering. No N/V/dysuria/diarrhea.  No CP, palp.  No bleeding or bruising.    Oncology history:  DX:   IgG kappa myeloma, diagnosed 09/07/10   - BMBx: 8% plasma cells, normal cyto 46XX, FISH showed only 3 copies 1q21, Stage 2 ISS (3.88 beta 2 microglobulin),IgG 4750, M-spike 3.2, kappa 586.9 mg/L.     TREATMENT  1. LINE1: RVD 10/2010 through April 2011 (? 4 cycles). Achieved VGPR with M-spike 0.4, IgG 886, kappa 27.6.   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  M-spike 0.6, IgG 1270.     3. LINE2: 08/05/14: Started Rev 15 mg 3/4. M-spike 1, IgG 1640, kappa 62.2 - SD    Biochemical PD 04/17/19: M-1.4, KLC 96.3. ? PD. Up Rev to 15 mg qd (3/4) and dex 8 mg wkly(4/4).       07/20/19: L hip pain. M-1.2, KLC 96. Cont. Rev 15 mg (3/4)/dex 8 mg (4/4).     LINE4: Pom 3, Dara, Dex 8 (3/4).      Severe infection-sepsis 12/22 - all meds stopped. Admit New Site 11/2021-01/2022  - CMV colitis, PE, Cholecystitis.  - Hospitalized in [facility] thru 6/2  - Hospitalized [place] 6/14 - 7/13 - colitis/N[place]    LINE5: Ixa/Pom/dex Cycle #1 07/2023       Past Medical History:   Diagnosis Date    Acid reflux disease     Anxiety     Depression     Hereditary angioedema (CMS code)     Multiple myeloma, without mention of having achieved remission     IgG kappa    Myeloma (CMS code) 05/10/2011     Past medical, family and social histories, as well as medications and allergies, were reviewed and updated in the medical record as appropriate.    Current Outpatient Medications   Medication Sig Dispense Refill    acyclovir (  ZOVIRAX) 400 mg tablet Take 1 tablet (400 mg total) by mouth in the morning and 1 tablet (400 mg total) in the evening. 180 tablet 3    apixaban (ELIQUIS) 5 mg tablet Take 1 tablet (5 mg total) by mouth 2 (two) times daily      desvenlafaxine succinate (PRISTIQ) 25 mg 24 hr tablet TAKE 1 TABLET BY MOUTH EVERY DAY IN THE MORNING 90 tablet 1    dexAMETHasone (DECADRON) 4 mg tablet Take 5 tablets (20 mg total) by mouth every 7 (seven) days TAKE 5 TABLETS (20MG ) WEEKLY PRIOR TO iXAZOMIB. DO NOT TAKE FOR WEEK OFF IXAZOMIB 40 tablet 0    filgrastim-aafi (NIVESTYM) 300 mcg/mL injection Inject 1 mL (300 mcg total) under the skin daily as needed (for absolute neutrophil coung less than 1) Anc<1 6 mL 0    ixazomib 3 mg CAP Take 3 mg by mouth every 7 (seven) days Take 3mg  on Day1, 8, 15 9 capsule 0    ondansetron (ZOFRAN) 8 mg tablet Take 1 tablet (8 mg total) by mouth every 8 (eight) hours as needed for  Nausea      pomalidomide (POMALYST) 2 mg CAP Take 1 capsule (2 mg total) by mouth nightly at bedtime TAKE ONE CAPSULE BY MOUTH FOR 21 DAYS FOLLOWED BY 7 DAYS OFF 21 capsule 0     No current facility-administered medications for this visit.       ALLERGIES  Allergies/Contraindications   Allergen Reactions    Diphenoxylate-Atropine Abdominal Pain     Severe stomach pain     Caffeine Anxiety     "Jitters"       PHYSICAL EXAM- LIMIT EXAM BY VIDEO  Objective: ECOG1  Constitutional:  Pleasant over video  HENT:  Normocephalic, Neck- Supple.   SKIN: No rash noted Eyes:  PERRL, EOMI, sclera appear clear  Neurologic:  Alert & interactive, nl affect, No focal deficits noted.       RESULTS: Labs in [facility] were reviewed. Lab results reviewed and interpretation provided to patient  Lab Results   Component Value Date    WBC Count 2.8 (L) 09/28/2023    Hemoglobin 11.9 09/28/2023    Hematocrit 36.0 09/28/2023    MCV 90 09/28/2023    Platelet Count 180 09/28/2023     Lab Results   Component Value Date    Creatinine 0.63 09/28/2023    Creatinine (POCT) 0.8 06/05/2014         Outside Labs:    No results found.  ASSESSMENT & PLAN  1. Multiple Myeloma: Impression: established - stable - on therapy - requiring disease monitoring  This patient is on therapy and at high-risk for side effects and progression and requiring intensive monitoring. The patient's therapy is IPd (C1D1 = 07/06/23) - s/p 2 cycles - no dose adjustments.    We reviewed the l11/13/24 abs: SIFE, SPEP, SFLC, CBC => m-prot 1, IgG 1314. KLC 179, LLC 9.8, Hgb 11,9, WBC 2.8, ANC 0.9  Since starting new therapy, M protein downtrended from 1.4, KLC stable.    May need PRN cytokines for neutropenia - has home Nivestym (biosimilar of neupogen. IgG level is slightly lower.      The patient has a high complexity problem: multiple myeloma, which is an incurable malignancy and the patient is receiving active therapy, which have side effects and poses a threat to life and bodily  function.    Regarding data complexity, I have reviewed the following tests => SPEP, SIFE, SFLCs, CBC  and CMP.      Medical Decision Making/ Plan: - Continue on IPd therapy   - continue daily eliquis bid  - Continue to follow myeloma protein levels every 4-6 weeks  - Follow-up monthly alternating APP and MD     2. Immunocompromised state/Infectious Disease Concerns: Impression: established - immunocompromised from disease/treatment:  The patient is immunocompromised and is at high-risk from infection due to therapy. Needs to be up-to-date on Covid vaccine. Has been sheltering from exposure. No exposures.    Requiring close follow-up for symptoms.  At risk for VZV reactivation and is on ACV. No current signs of infection. Will call with new signs of infection        Plan: - Continue prophylaxis  - monitor CMV monthly (09/28/23 CMV PCR neg)  - recommend yearly flu vaccine  - s/p RSV vaccine 12/31/22  - Shelter from [place] exposure - does not want new booster (history of diarrhea with prior covid vaccines)  - The patient will call with any symptoms or signs of infection    3. Depression/Psychologic/Social Stressors: Impression: established - stable  The patient has cancer and has situation depression and is at risk for anxiety. Needs close follow-up    Currently, the attitude is good. There are no signs of anxiety or active depression. Mood appropriate, understands plan      Plan: No additional medications needed    4. Anemia: Impression: established - stable    The blood counts are Good, follow. Next CBC in 2-4 weeks.       Plan: No transfusions or cytokines needed today.    5. Neutropenia/Thrombocytopenia: Impression: established - stable    The treatment has toxicity on the bone marrow - no current signs of suppression    Plan: No cytokines or transfusions    6. RI/ Electrolyte Replacement: Impression: established - stable     Taking ample fluids and avoiding nephrotoxins.  Lytes & Cr Okay, appears euvolemic. Appetite  good and weight unchanged. No new medications needed.     Plan: Continue adequate fluid intake & balanced, nutritional diet   - Continue as much exercise as possible.  - No additional supplementation needed.    7. Osteopenia: Impression: established - stable:    The patient is at risk for fracture due to MM. We recommend Calcium and Vit D replacement. We also recommend an aggressive exercise program.    Plan: - No  Biphosphonates    8. Secondary Cancer Risk: impression: established - NEEDS close follow-up  The patient has high-risk of secondary malignancies. Needs close follow-up and routine cancer screening.    Plan: - Derm follow-up yearly  - Colonoscopy every 5 years  - Screening per PCP.      Royal Piedra NP  Hematology, Blood & Marrow Transplant, and Cellular Therapy  [facility]  office [phone]  fax [phone]  10/03/23     Patient Care Team:  Trudie Reed, DO as PCP - General (Family Medicine)  Hilary Hertz, MD (Hematology and Oncology)  Generic Provider Mychart  Marzella Schlein, MD (Palliative Care Medicine)  Larina Earthly III, MD (Hematology)  Mills Koller, NP as Nurse Practitioner (Family Medicine)  Bethann Berkshire, MD (Transplant Hepatology)    APP Visit Information:     APP Service Type:  Independent  Available MD consultant:  Larina Earthly III, MD       Problems: incurable malignancy, requires close monitoring for disease  progression  Risk of complications, morbidity/mortality of patient management: high; the patient's systemic cancer therapy requires regular and intensive monitoring for potential major/life-threatening toxicities  Data review/interpretation: I reviewed and/or ordered 3+ tests and/or documents

## 2023-10-05 LAB — COMPREHENSIVE METABOLIC PANEL (BMP, AST, ALT, T.BILI, ALKP, TP ALB)
AST: 13 [IU]/L (ref 0–40)
Alanine transaminase: 21 [IU]/L (ref 0–32)
Albumin, Serum / Plasma: 4.1 g/dL (ref 3.9–4.9)
Alkaline Phosphatase: 95 [IU]/L (ref 44–121)
BUN/Creatinine Ratio (External Lab): 28 (ref 12–28)
Bilirubin, Total: 0.9 mg/dL (ref 0.0–1.2)
Calcium, total, Serum / Plasma: 9.3 mg/dL (ref 8.7–10.3)
Carbon Dioxide, Total: 24 mmol/L (ref 20–29)
Chloride, Serum / Plasma: 105 mmol/L (ref 96–106)
Creatinine: 0.57 mg/dL (ref 0.57–1.00)
Globulin, Total: 2.2 g/dL (ref 1.5–4.5)
Glucose, non-fasting: 90 mg/dL (ref 70–99)
Potassium, Serum / Plasma: 3.9 mmol/L (ref 3.5–5.2)
Protein, Total, Serum / Plasma: 6.3 g/dL (ref 6.0–8.5)
Sodium, Serum / Plasma: 140 mmol/L (ref 134–144)
Urea Nitrogen, serum/plasma: 16 mg/dL (ref 8–27)
eGFRcr: 102 mL/min/{1.73_m2} (ref >59)

## 2023-10-05 LAB — COMPLETE BLOOD COUNT WITH DIFFERENTIAL
% Basophils: 2 %
% Eosinophils: 2 %
% Immature Granulocytes: 3 %
% Lymphocytes: 36 %
% Monocytes: 11 %
% Neutrophils: 46 %
Abs Basophils: 0.1 10*3/uL (ref 0.0–0.2)
Abs Eosinophils: 0.1 10*3/uL (ref 0.0–0.4)
Abs Lymphocytes: 1.1 10*3/uL (ref 0.7–3.1)
Abs Monocytes: 0.3 10*3/uL (ref 0.1–0.9)
Abs Neutrophils: 1.5 10*3/uL (ref 1.4–7.0)
Hematocrit: 36.9 % (ref 34.0–46.6)
Hemoglobin: 11.9 g/dL (ref 11.1–15.9)
MCH: 29.3 pg (ref 26.6–33.0)
MCHC: 32.2 g/dL (ref 31.5–35.7)
MCV: 91 fL (ref 79–97)
Platelet Count: 154 10*3/uL (ref 150–450)
RBC Count: 4.06 x10E6/uL (ref 3.77–5.28)
RDW-CV: 16 % — ABNORMAL HIGH (ref 11.7–15.4)
WBC Count: 3.2 10*3/uL — ABNORMAL LOW (ref 3.4–10.8)

## 2023-10-07 LAB — CYTOMEGALOVIRUS DNA, QUANTITATIVE PCR, PLASMA: Cytomegalovirus DNA, Quantitative PCR: NEGATIVE [IU]/mL

## 2023-10-18 MED ORDER — POMALYST 2 MG CAPSULE
2 mg | ORAL_CAPSULE | Freq: Every day | ORAL | 0 refills | Status: DC
Start: 2023-10-18 — End: 2023-11-10

## 2023-10-19 ENCOUNTER — Telehealth: Admit: 2023-10-19 | Discharge: 2023-10-19 | Payer: PRIVATE HEALTH INSURANCE

## 2023-10-19 DIAGNOSIS — F419 Anxiety disorder, unspecified: Secondary | ICD-10-CM

## 2023-10-19 MED ORDER — DEXAMETHASONE 4 MG TABLET
4 mg | ORAL_TABLET | ORAL | 0 refills | Status: DC
Start: 2023-10-19 — End: 2024-01-02

## 2023-10-19 NOTE — Progress Notes (Signed)
Subjective    Ariel Braun is a 62 y.o. female with multiple myeloma (diagnosis category: Multiple myeloma (including amyloidosis)  metastatic) who is being seen via video visit while patient is at home at the request of Dr. Dolores Lory of (referral category: Oncology/Rad Onc/Onc Surgery) for symptom management.    Medical team: PCP Dr. Jannetta Quint, oncologist Dr. Delaney Meigs.  Hepatologist Dr. Newman Nip.    Primary caregiver(s):  Her daughter  Palliative care team member disciplines: Physician    HPI  Ariel Braun is a 62 y.o. female with pain, occasional episodes of constipation, improved anxiety with mental stressors and good appetite.     Today patient/family reports:     Pain:  Reports pain in her upper back that radiates towards her side and hip and notices she had pain after using 1st dose Zarxio and Nivestym. States that the pain felt like it was due to inflammation and swelling and did not feel like bone pain. States that the pain has gradually started improving.     Neuropathy:  - Denies- has not experienced it yet  - Takes R-ALA supplement and is consistent with it. Has started taking it 1-2 weeks prior to initiating cancer treatment.    Anxiety:  - Significantly improved. Feels that she is coping better with anxiety now.  - Continues on Pristiq 25 mg PO qAM. Notices significant benefit when compared to mirtazapine. States that dexamethasone was affecting her emotionally when she was on mirtazapine and feels better now since starting Pristiq.  - States that her daughter continues to be ill, has been following up with [place] [facility]. States that she has had multiple ER visits and appointments with multiple providers.    Fatigue:  - Manageable. Reports occasional episodes where she has significant fatigue.    Constipation:  - Reports occasional episodes of constipation. States that it is improving currently.  - Has been trying to include more fiber in her diet and staying well-hydrated as  she is constantly thirsty.  - Has tried Metamucil fiber gummies with benefit.    Appetite:  - Endorses good appetite. Currently on a higher protein diet as she has occasional episodes where she feels "shaky".    Nausea:  - Denies    Sleep:   - Sleeps better. Takes sleep medications as needed.    Cancer-directed treatment:  - Currently undergoing cancer treatment with IPd therapy. Finds that the symptoms are manageable and reports occasional episodes of shakiness.  - Her WBC tends to decline towards the end of her cancer treatment cycle and uses Zarxio and Nivestym injection q14h. Was previously visiting [place] for receiving injections q7d.    Social history most significant for:  Lives with husband, mom and son, who has intellectual disability.  Isolated.  Cares for son along with help from daughter, whom she is close to.    Daughter also lives with her.    She reports she is in a caregiver role in her family.      Spiritual:  - Born and raised Catholic but tends to gain more spiritual information and guidance from principles that are Christian-based and Bible-based and applies those principles and traditions in real life.  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    - She and her daughter watch a lot of [employer]ri[employer] Knuckles programs that are uplifting and feels that it provides positive direction and helps her focus on good things in her life. Prays and reads the Bible and states that  this helps.    Fam hx: Strong fam hx of addiction.  Father was alcoholic.  Mom used valium to cope.  Brothers with drug and alcohol addiction.      Patient/Family screened for spiritual care needs: Yes  Patient/Family screened for psychosocial needs: Yes    SMS Well-Being Survey      06/26/2023     9:37 PM 08/03/2023     9:49 AM   SMS Well-Being Scores   Pain  8   Shortness of breath  0   Constipation 8 8   Tiredness  5   Nausea  0   Depression  3   Anxiety 7 7   Drowsiness  7   Appetite 0 9   Feeling of wellbeing 5 7   Other problem Sometimes feel  shakiness (inside feeling not trimmers)  At times feel like it's too much med.    "I feel at peace." A moderate amount A moderate amount   "At times I worry I will be a burden to my family." A moderate amount A moderate amount   How would you rate your overall quality of life? Hendricks Limes   DPOA I have an Advanced Health Care Directive Health Care Directive                Objective    Physical Exam:   NAD  Sitting on couch  Normal neck ROM  Normal resp effort    Performance status (by Palliative Performance Scale): 70% - Reduced ambulation, Unable Normal Job/Work, Significant disease, Occasional assistance necessary, Normal or reduced intake, Full conscious level      Review of Prior Testing  I have personally reviewed and interpreted the following studies:  Lab Results   Component Value Date    WBC Count 3.2 (L) 10/04/2023    Hemoglobin 11.9 10/04/2023    Hematocrit 36.9 10/04/2023    MCV 91 10/04/2023    Platelet Count 154 10/04/2023     Lab Results   Component Value Date    Sodium, Serum / Plasma 140 10/04/2023    Potassium, Serum / Plasma 3.9 10/04/2023    Urea Nitrogen, serum/plasma 16 10/04/2023    Creatinine 0.57 10/04/2023     Lab Results   Component Value Date    Thyroid Stimulating Hormone 3.98 12/15/2021     No results found.    #  Cancer stage at diagnosis- see onc note for staging.      Assessment and Plan    Ariel Braun is a 62 y.o. 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      Pain: Reports pain in her upper back that radiates towards her side and hip and notices she had pain after using 1st dose Zarxio and Nivestym. States that the pain felt like it was due to inflammation and swelling and did not feel like bone pain. States that the pain has gradually started improving.  - Suggested reaching out to Dr. Modesto Charon regarding  taking claritin prior to [place] Discussed that people usually have pain after receiving these injections and claritin usually improves the pain when taken prior to receiving it. Writer will reach out to Dr. Modesto Charon.    Neuropathy: Denies  - Continue R-ALA supplement. Has been consistent with it and has started taking it 1-2 weeks prior to initiating  cancer treatment.    Anxiety:- Significantly improved after starting Pristiq. Feels that she is coping better with anxiety now.  - Continue Pristiq 25 mg PO qAM. Notices significant benefit when compared to mirtazapine. States that dexamethasone was affecting her emotionally when she was on mirtazapine and feels better now since starting Pristiq.  Previously discussed:  - Discussed tapering off Pristiq in the future if she continues to have lower levels of anxiety and less mental stressors.  - Encouraged continuing on current practices that help improve her anxiety and suggested meditation practice.  Also suggested Anxiety and Phobia workbook by Thomasenia Sales.    - Supportive listening provided.  - Looked into dementia risk with Pristiq.  Appears to be retrospective study.  Her dose is low.  Advised this drug is often used in older adults and her risk of dementia from it is likely very very low.  - She reports active prayer and meditation practice.  - Not interested in talk therapy or counseling. She has not felt it was a good fit for her in the past.    Constipation: Reports occasional episodes of constipation. States that it is improving currently. Has been trying to include more fiber in her diet and staying well-hydrated as she is constantly thirsty.  - Continue current diet regimen.  - Has tried Metamucil fiber gummies with benefit.  - Declines prune juice.      Fatigue: Manageable. Reports occasional episodes where she has significant fatigue.    Anorexia: Endorses good appetite. Currently on a higher protein diet as she has occasional episodes where she  feels "shaky".    Nausea: Denies    Insomnia: Sleeps better. Takes sleep medications as needed.    Cancer-directed treatment: Currently undergoing cancer treatment with IPd therapy. Finds that the symptoms are manageable.  - Her WBC tends to decline towards the end of her cancer treatment cycle and uses Zarxio and Nivestym injection q14h. Was previously visiting [place] for receiving injections q7d.  Previously discussed:  - Currently on Ninalro/Pom/Dex. Discussed that steroids like dexamethasone can increase her anxiety, given that she continues to have mental stressors.    Advance Care Planning   - Prognostic awareness: N/A  - Relevant values/priorities: Reduction in anxiety, care for her son.    - Surrogate decision maker: Not addressed  - Resuscitation preferences: Unknown  -  AD: Deferred  - POLST: Deferred    - GOC convo deferred to future visit  - Today I spent 0 minutes with the patient specifically discussing ACP.     Time Spent  I spent a total of 35 minutes on this patient's care on the day of their visit excluding time spent related to any billed procedures. This time includes time spent with the patient as well as time spent documenting in the medical record, reviewing patient's records and tests, obtaining history, placing orders, communicating with other healthcare professionals, counseling the patient, family, or caregiver, and/or care coordination for the diagnoses above.    I performed this evaluation using real-time telehealth tools, including a live video Zoom connection between my location and the patient's location. Prior to initiating, the patient consented to perform this evaluation using telehealth tools.        Thank you for allowing [place] to participate in the care of your patient. Please feel free to reach out with any questions, concerns or ideas about additional ways we could serve your patient & their family.    Follow-up: March 14, 2024 at 12:00  PM.    Marzella Schlein, MD    I,  Modesto Charon Mahalingam am acting as a scribe for services provided by Marzella Schlein, MD on 10/19/23, 3:57 PM.    The above scribed documentation as annotated by me accurately reflects the services I have provided.   Marzella Schlein, MD  10/24/2023 8:16 PM

## 2023-10-25 NOTE — Telephone Encounter (Signed)
 Subjective:  Patient reports experiencing extreme upper middle back pain (8/10) starting 11/17, two days after self-administering filgrastim-aafi (NIVESTYM) 300 mcg/mL injection on 11/15. Pain was deep and internal, resembling trapped gas rather than spinal pain. It initially resolved but recurred on 12/8, now localized to the left side. Pain worsens with movement, twisting, or turning the head to the affected side, but there is no tenderness to touch. Patient finds relief by lying down, stretching, and wearing loose bras.  Patient denies taking any medications for the pain and prefers not to use filgrastim-aafi again, citing it as the likely cause. She has not taken another dose since the initial injection.  Objective:  PET scan scheduled for 11/14/2023.  Labs drawn today.  Assessment:  Recurrent back pain possibly associated with filgrastim-aafi injection.  Pain characteristics suggest soft tissue or musculoskeletal etiology rather than spinal or visceral origin.  Plan:  Await input from Delaney Meigs, NP, and Dr. Elijio Miles regarding next steps.  Evaluate the feasibility of switching to a short-acting growth factor injection if clinically appropriate.  Encourage continued symptom management through positional adjustments and stretching.  Follow-up after PET scan or earlier as necessary based on pain persistence or worsening.  Next Steps:  Monitor for advisement from the team on alternative growth factor options and further evaluation of back pain.

## 2023-10-28 ENCOUNTER — Encounter: Admit: 2023-10-28 | Payer: PRIVATE HEALTH INSURANCE

## 2023-10-28 DIAGNOSIS — C9000 Multiple myeloma not having achieved remission: Secondary | ICD-10-CM

## 2023-10-28 MED ORDER — FILGRASTIM-SNDZ 300 MCG/0.5 ML INJECTION SYRINGE
300 | Freq: Every day | INTRAMUSCULAR | Status: DC | PRN
Start: 2023-10-28 — End: 2023-10-29
  Administered 2023-10-28: 23:00:00 300 ug via SUBCUTANEOUS

## 2023-10-28 MED FILL — ZARXIO 300 MCG/0.5 ML INJECTION SYRINGE: 300 300 mcg/0.5 mL | INTRAMUSCULAR | Qty: 1

## 2023-10-28 NOTE — Nursing Note (Signed)
Injection Administration Note    Drug Name: Zarxio      Frequency: as ordered    Site: left arm    Required lab work (if applicable): n/a    Next Injection Due: as ordered    Patient arrived to unit in stable condition. Plan of care reviewed. Patient tolerated Zarxio injection to left arm without complications. Patient discharged in ambulatory, stable condition.     .COMPLETE THE DAY ONCOLOGY:   The following check list items below have been reviewed and implemented:   1. Reviewed [facility] Treatment Plan, Therapy Plan, Signed and Held Orders Yes  2. Growth Factors Administered yes   3. Are there additional medications/Injections in Therapy Plans to be administered? No  4. Medication start and end times completed Yes  5. Follow-up appointment scheduled (Date/Time): Per orders, PRN ANC <1000  6. Comments: see comment above

## 2023-10-29 MED ORDER — NINLARO 3 MG CAPSULE
3 | ORAL_CAPSULE | ORAL | 0 refills | Status: DC
Start: 2023-10-29 — End: 2024-03-26

## 2023-11-03 ENCOUNTER — Telehealth: Admit: 2023-11-03 | Discharge: 2023-11-04 | Payer: PRIVATE HEALTH INSURANCE | Attending: Physician

## 2023-11-03 DIAGNOSIS — D849 Immunodeficiency, unspecified: Secondary | ICD-10-CM

## 2023-11-03 NOTE — Patient Instructions (Addendum)
 Agree with G-CSF for home administration - for ANC <1000/uL  Agree with CBC every week  Continue regimen with Ixazomib, Pom and dex.   Will follow-up results of PETCT scan when available  Follow-up in 3-4  months with Dr Daphine Deutscher

## 2023-11-03 NOTE — Progress Notes (Signed)
 Ariel Braun  [id number]    Date of Service: 11/03/2023    Reason for visit / Chief Complaints:    Subjective   Ariel Braun is a 62 y.o. female with MM who is on VIDEO follow-up    I performed this consultation using real-time Telehealth tools, including a live video connection between my location and the patient's location. Prior to initiating the consultation, I obtained informed verbal consent to perform this consultation using Telehealth tools and answered all the questions about the Telehealth interaction. Patient in [place].    Events since the last visit: The patient has been ON Ixa-Pd therapy. Has been doing OK. Occ will have "light-headedness". Usually resolves with food. Wonders if her symptoms are from low iron. No infectious symptoms. No "hot spots" or new bone pain. No significant signs of progressive MM. No kidney/urinary difficuties. No f/chills, URI symptoms, sinus congestion, sore throat or other infectious symptoms. Has been sheltering. No N/V/dysuria/diarrhea.  No CP, palp.  No bleeding or bruising.    Oncology history:  DX:   IgG kappa myeloma, diagnosed 09/07/10   - BMBx: 8% plasma cells, normal cyto 46XX, FISH showed only 3 copies 1q21, Stage 2 ISS (3.88 beta 2 microglobulin),IgG 4750, M-spike 3.2, kappa 586.9 mg/L.     TREATMENT  1. LINE1: RVD 10/2010 through April 2011 (? 4 cycles). Achieved VGPR with M-spike 0.4, IgG 886, kappa 27.6.   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 M-spike 0.6, IgG 1270.     3. LINE2: 08/05/14: Started Rev 15 mg 3/4. M-spike 1, IgG 1640, kappa 62.2 - SD    Biochemical PD 04/17/19: M-1.4, KLC 96.3. ? PD. Up Rev to 15 mg qd (3/4) and dex 8 mg wkly(4/4).       07/20/19: L hip pain. M-1.2, KLC 96. Cont. Rev 15 mg (3/4)/dex 8 mg (4/4).     LINE4: Pom 3, Dara, Dex 8 (3/4).      Severe infection-sepsis 12/22 - all meds stopped. Admit [place] 11/2021-01/2022  - CMV colitis, PE, Cholecystitis.  - Hospitalized  in [facility] thru 6/2  - Hospitalized [place] 6/14 - 7/13 - colitis/Noro    LINE5: Ixa/Pom/dex Cycle #1 07/2023       Past Medical History:   Diagnosis Date    Acid reflux disease     Anxiety     Depression     Hereditary angioedema (CMS code)     Multiple myeloma, without mention of having achieved remission     IgG kappa    Myeloma (CMS code) 05/10/2011     Past medical, family and social histories, as well as medications and allergies, were reviewed and updated in the medical record as appropriate.    Current Outpatient Medications   Medication Sig Dispense Refill    acyclovir (ZOVIRAX) 400 mg tablet Take 1 tablet (400 mg total) by mouth in the morning and 1 tablet (400 mg total) in the evening. 180 tablet 3    apixaban (ELIQUIS) 5 mg tablet Take 1 tablet (5 mg total) by mouth 2 (two) times daily      desvenlafaxine succinate (PRISTIQ) 25 mg 24 hr tablet TAKE 1 TABLET BY MOUTH EVERY DAY IN THE MORNING 90 tablet 1    dexAMETHasone (DECADRON) 4 mg tablet Take 5 tablets (20 mg total) by mouth every 7 (seven) days TAKE 5 TABLETS (20MG ) WEEKLY PRIOR TO iXAZOMIB. DO NOT TAKE FOR  WEEK OFF IXAZOMIB 40 tablet 0    filgrastim-aafi (NIVESTYM) 300 mcg/mL injection Inject 1 mL (300 mcg total) under the skin daily as needed (for absolute neutrophil coung less than 1) Anc<1 6 mL 0    ixazomib (NINLARO) 3 mg CAP Take 3 mg by mouth every 7 (seven) days TAKE EVERY & DAYS D1, D8 and D15 OF A 28 DAY CYCLE 18 capsule 0    ondansetron (ZOFRAN) 8 mg tablet Take 1 tablet (8 mg total) by mouth every 8 (eight) hours as needed for Nausea (Patient not taking: Reported on 10/03/2023)      pomalidomide (POMALYST) 2 mg capsule Take 1 capsule (2 mg total) by mouth nightly at bedtime TAKE ONE CAPSULE BY MOUTH FOR 21 DAYS FOLLOWED BY 7 DAYS OFF 21 capsule 0     No current facility-administered medications for this visit.       ALLERGIES  Allergies/Contraindications   Allergen Reactions    Diphenoxylate-Atropine Abdominal Pain     Severe stomach  pain     Caffeine Anxiety     "Jitters"       PHYSICAL EXAM- LIMIT EXAM BY VIDEO  Objective: ECOG1  Constitutional:  Pleasant over video  HENT:  Normocephalic, Neck- Supple.   SKIN: No rash noted Eyes:  PERRL, EOMI, sclera appear clear  Neurologic:  Alert & interactive, nl affect, No focal deficits noted.       RESULTS: Labs in [facility] were reviewed. Lab results reviewed and interpretation provided to patient  Lab Results   Component Value Date    WBC Count 2.8 (L) 10/25/2023    Hemoglobin 11.4 10/25/2023    Hematocrit 35.2 10/25/2023    MCV 92 10/25/2023    Platelet Count 174 10/25/2023     Lab Results   Component Value Date    Creatinine 0.55 (L) 10/25/2023    Creatinine (POCT) 0.8 06/05/2014         Outside Labs:    No results found.  ASSESSMENT & PLAN  1. Multiple Myeloma: Impression: established - stable - on therapy - requiring disease monitoring  This patient is on therapy and at high-risk for side effects and progression and requiring intensive monitoring. The patient's therapy is IPd: Ixaz 3 mg, pom 2 mg, Dex 20mg  3/4 weeks.     We reviewed the labs: SIFE, SPEP, SFLC, CBC => m-prot 0.8, IgG 1189, KLC 132.5, LLC 9.1. ANC 0.9. No sign of progression.     We discussed that she is doing well. The low ANC is expected on Pom and we should continue as needed G-CSF injections. Will continue to follow MM numbers - c losely.    The patient has a high complexity problem: multiple myeloma, which is an incurable malignancy and the patient is receiving active therapy, which have side effects and poses a threat to life and bodily function.    Regarding data complexity, I have reviewed the following tests => SPEP, SIFE, SFLCs, CBC and CMP.      Medical Decision Making/ Plan: - Continue on IPD therapy   - Continue to follow myeloma protein levels every 4-6 weeks  - Follow-up in 4 weeks with Harriett Sine - 3 months MArtin.    2. Immunocompromised state/Infectious Disease Concerns: Impression: established - immunocompromised from  disease/treatment:  The patient is immunocompromised and is at high-risk from infection due to therapy. Needs to be up-to-date on Covid vaccine. Has been sheltering from exposure. No exposures.    Requiring close follow-up for symptoms.  At risk for VZV reactivation and is on ACV. No current signs of infection. Will call with new signs of infection        Plan: - Continue prophylaxis  - yearly flu vaccine  - Shelter from Covid exposure  - The patient will call with any symptoms or signs of infection    3. Depression/Psychologic/Social Stressors: Impression: established - stable  The patient has cancer and has situation depression and is at risk for anxiety. Needs close follow-up    Currently, the attitude is good. There are no signs of anxiety or active depression. Mood appropriate, understands plan      Plan: No additional medications needed    4. Anemia: Impression: established - stable    The blood counts are stable. Next CBC in 1-2 weeks.       Plan: No transfusions or cytokines needed today.    5. Neutropenia/Thrombocytopenia: Impression: established - stable    The treatment has toxicity on the bone marrow - Has current signs of suppression and will need ongoing G-CSF.     Plan: No cytokines or transfusions    6. RI/ Electrolyte Replacement: Impression: established - stable     Taking ample fluids and avoiding nephrotoxins.  Lytes & Cr Okay, appears euvolemic. Appetite good and weight unchanged. No new medications needed.     Plan: Continue adequate fluid intake & balanced, nutritional diet   - Continue as much exercise as possible.  - No additional supplementation needed.    7. Osteopenia: Impression: established - stable:    The patient is at risk for fracture due to MM. We recommend Calcium and Vit D replacement. We also recommend an aggressive exercise program.    Plan: - no Biphosphonates    8. Secondary Cancer Risk: impression: established - NEEDS close follow-up  The patient has high-risk of secondary  malignancies. Needs close follow-up and routine cancer screening.    Plan: - Derm follow-up yearly  - Colonoscopy every 5 years  - Screening per PCP.

## 2023-11-07 MED ORDER — NIVESTYM 300 MCG/ML INJECTION SOLUTION
300 | Freq: Every day | INTRAMUSCULAR | 0 refills | Status: DC | PRN
Start: 2023-11-07 — End: 2023-11-10

## 2023-11-10 MED ORDER — NIVESTYM 300 MCG/0.5 ML SUBCUTANEOUS SYRINGE
300 | Freq: Every day | SUBCUTANEOUS | 0 refills | Status: DC | PRN
Start: 2023-11-10 — End: 2023-11-10

## 2023-11-10 MED ORDER — POMALYST 2 MG CAPSULE
2 mg | ORAL_CAPSULE | Freq: Every day | ORAL | 0 refills | Status: DC
Start: 2023-11-10 — End: 2023-11-25

## 2023-11-10 MED ORDER — NIVESTYM 300 MCG/0.5 ML SUBCUTANEOUS SYRINGE
300 mcg/0.5 mL | Freq: Every day | SUBCUTANEOUS | 0 refills | Status: DC | PRN
Start: 2023-11-10 — End: 2023-11-21

## 2023-11-10 NOTE — Progress Notes (Signed)
 Changed nivestym prescription to pre-filled syringe and sent to [facility] for benefits investigation

## 2023-11-10 NOTE — Progress Notes (Signed)
Nivestym rerouted to contracted/preferred specialty pharmacy - [facility].    Reroute per insurance.

## 2023-11-21 MED ORDER — FILGRASTIM-SNDZ 300 MCG/0.5 ML INJECTION SYRINGE
3000.5 mcg/0.5 mL | Freq: Every day | INTRAMUSCULAR | 0 refills | Status: DC | PRN
Start: 2023-11-21 — End: 2023-12-27

## 2023-11-23 ENCOUNTER — Encounter: Admit: 2023-11-23 | Payer: PRIVATE HEALTH INSURANCE

## 2023-11-23 DIAGNOSIS — C9000 Multiple myeloma not having achieved remission: Secondary | ICD-10-CM

## 2023-11-23 LAB — COMPREHENSIVE METABOLIC PANEL (BMP, AST, ALT, T.BILI, ALKP, TP ALB)
AST: 18 IU/L (ref 0–40)
Alanine transaminase: 15 IU/L (ref 0–32)
Albumin, Serum / Plasma: 4.1 g/dL (ref 3.9–4.9)
Alkaline Phosphatase: 127 IU/L — ABNORMAL HIGH (ref 44–121)
BUN/Creatinine Ratio (External Lab): 27 (ref 12–28)
Bilirubin, Total: 0.6 mg/dL (ref 0.0–1.2)
Calcium, total, Serum / Plasma: 9.1 mg/dL (ref 8.7–10.3)
Carbon Dioxide, Total: 25 mmol/L (ref 20–29)
Chloride, Serum / Plasma: 108 mmol/L — ABNORMAL HIGH (ref 96–106)
Creatinine: 0.55 mg/dL — ABNORMAL LOW (ref 0.57–1.00)
Globulin, Total: 2.1 g/dL (ref 1.5–4.5)
Glucose, non-fasting: 84 mg/dL (ref 70–99)
Potassium, Serum / Plasma: 3.8 mmol/L (ref 3.5–5.2)
Protein, Total, Serum / Plasma: 6.2 g/dL (ref 6.0–8.5)
Sodium, Serum / Plasma: 142 mmol/L (ref 134–144)
Urea Nitrogen, serum/plasma: 15 mg/dL (ref 8–27)
eGFRcr: 103 mL/min/{1.73_m2} (ref >59)

## 2023-11-23 LAB — COMPLETE BLOOD COUNT WITH DIFFERENTIAL
% Basophils: 1 %
% Eosinophils: 3 %
% Immature Granulocytes: 1 %
% Lymphocytes: 48 %
% Monocytes: 19 %
% Neutrophils: 28 %
Abs Basophils: 0 10*3/uL (ref 0.0–0.2)
Abs Eosinophils: 0.1 10*3/uL (ref 0.0–0.4)
Abs Lymphocytes: 1.1 10*3/uL (ref 0.7–3.1)
Abs Monocytes: 0.4 10*3/uL (ref 0.1–0.9)
Abs Neutrophils: 0.7 10*3/uL — ABNORMAL LOW (ref 1.4–7.0)
Hematocrit: 34.8 % (ref 34.0–46.6)
Hemoglobin: 11.3 g/dL (ref 11.1–15.9)
MCH: 28.9 pg (ref 26.6–33.0)
MCHC: 32.5 g/dL (ref 31.5–35.7)
MCV: 89 fL (ref 79–97)
Platelet Count: 177 10*3/uL (ref 150–450)
RBC Count: 3.91 x10E6/uL (ref 3.77–5.28)
RDW-CV: 16.1 % — ABNORMAL HIGH (ref 11.7–15.4)
WBC Count: 2.3 10*3/uL — CL (ref 3.4–10.8)

## 2023-11-23 LAB — FERRITIN: Ferritin, Serum/Plasma: 49 ng/mL (ref 15–150)

## 2023-11-23 MED ORDER — FILGRASTIM-SNDZ 300 MCG/0.5 ML INJECTION SYRINGE
300 | Freq: Every day | INTRAMUSCULAR | Status: DC | PRN
Start: 2023-11-23 — End: 2023-11-24
  Administered 2023-11-23: 23:00:00 300 ug via SUBCUTANEOUS

## 2023-11-23 MED FILL — ZARXIO 300 MCG/0.5 ML INJECTION SYRINGE: 300 mcg/0.5 mL | INTRAMUSCULAR | Qty: 1

## 2023-11-23 NOTE — Nursing Note (Signed)
Injection Administration Note     Drug: zarxio     Frequency: prn for anc < 1000 per tx orderd     Site: RIGHT arm     Labs:    142 108* 15 / 84    3.8 25 0.55* \     01/07 0827     2.3* \ 11.3 / 177    / 34.8 \    01/07 0827     ANC: 0.7 on 11/22/23    Next appointment: 12/21/23        Assessment: Pt arrived in stable condition, denies fever or flu like symptoms, cough, shortness of breath, pain. Pt tolerated injection without incident or complications. Pt discharged in stable condition.

## 2023-11-26 LAB — CYTOMEGALOVIRUS DNA, QUANTITATIVE PCR, PLASMA: Cytomegalovirus DNA, Quantitative PCR: NEGATIVE [IU]/mL

## 2023-11-26 MED ORDER — POMALYST 2 MG CAPSULE
2 mg | ORAL_CAPSULE | Freq: Every day | ORAL | 0 refills | Status: DC
Start: 2023-11-26 — End: 2024-01-12

## 2023-12-16 ENCOUNTER — Telehealth: Admit: 2023-12-16 | Discharge: 2023-12-16 | Payer: PRIVATE HEALTH INSURANCE | Attending: Nurse Practitioner

## 2023-12-16 DIAGNOSIS — C9000 Multiple myeloma not having achieved remission: Secondary | ICD-10-CM

## 2023-12-16 DIAGNOSIS — E041 Nontoxic single thyroid nodule: Secondary | ICD-10-CM

## 2023-12-16 NOTE — Patient Instructions (Addendum)
Please follow up with Dr. Alain Honey  regarding recent anxiety attacks - I will send him a message    Ordered for Thyroid u/s @ [facility] - Dr Thana Ates requested on order.  Please await for our staff to obtain insurance auth and from there they will send you communication on when to call to schedule.    Continue Pom/Ixaz/dex and monthly local labs.    We will communicate with you when Zarxio self injections needed.    Follow up with Harriett Sine in 1 month and Dr. Daphine Deutscher in 3 months

## 2023-12-16 NOTE — Progress Notes (Signed)
Ariel Braun  [id number]    Date of Service: 12/16/2023    Reason for visit / Chief Complaints:    Subjective   Ariel Braun is a 63 y.o. female with MM who is on VIDEO follow-up    I performed this consultation using real-time Telehealth tools, including a live video connection between my location and the patient's location. Prior to initiating the consultation, I obtained informed verbal consent to perform this consultation using Telehealth tools and answered all the questions about the Telehealth interaction. Patient in [place].    Events since the last visit: The patient has been ON Ixa-Pd therapy. Has been doing OK. Occ will have "light-headedness" and jitteriness. Usually resolves with food. Wonders if her symptoms are from low iron. Some fatigue. No infectious symptoms. No "hot spots" or new bone pain. No significant signs of progressive MM. No kidney/urinary difficuties. No f/chills, URI symptoms, sinus congestion, sore throat or other infectious symptoms. Has been sheltering. No N/V/dysuria/diarrhea.  No CP, palp.  No bleeding or bruising.    Continues to experience stress with dealing with son's health issues, has adenoma.  Will need surgery. Continues to care for daughter, who has been ill and still no diagnosis.  She also cares for her husband who has diabetes.     Had anxiety attack x 2, once on 12/30, then again 1/27, lasting about 20-30 mins, felt suddenly flushed and diaphoretic, nauseated with coughing fit and gagging.      We discussed that she is Catholic, but has not been involved in her local church.  She would be interested in scripture and guidance with coping.    Feels pristiq has been helpful. Aroma therapy helps.     Oncology history:  DX:   IgG kappa myeloma, diagnosed 09/07/10   - BMBx: 8% plasma cells, normal cyto 46XX, FISH showed only 3 copies 1q21, Stage 2 ISS (3.88 beta 2 microglobulin),IgG 4750, M-spike 3.2, kappa 586.9 mg/L.     TREATMENT  1. LINE1: RVD 10/2010 through April 2011  (? 4 cycles). Achieved VGPR with M-spike 0.4, IgG 886, kappa 27.6.   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 M-spike 0.6, IgG 1270.     3. LINE2: 08/05/14: Started Rev 15 mg 3/4. M-spike 1, IgG 1640, kappa 62.2 - SD    Biochemical PD 04/17/19: M-1.4, KLC 96.3. ? PD. Up Rev to 15 mg qd (3/4) and dex 8 mg wkly(4/4).       07/20/19: L hip pain. M-1.2, KLC 96. Cont. Rev 15 mg (3/4)/dex 8 mg (4/4).     LINE4: Pom 3, Dara, Dex 8 (3/4).      Severe infection-sepsis 12/22 - all meds stopped. Admit [place] 11/2021-01/2022  - CMV colitis, PE, Cholecystitis.  - Hospitalized in [facility] thru 6/2  - Hospitalized [place] 6/14 - 7/13 - colitis/Noro    LINE5: Ixa/Pom/dex Cycle #1 07/2023       Past Medical History:   Diagnosis Date    Acid reflux disease     Anxiety     Depression     Hereditary angioedema (CMS code)     Multiple myeloma, without mention of having achieved remission     IgG kappa    Myeloma (CMS code) 05/10/2011     Past medical, family and social histories, as well as medications and allergies, were reviewed and updated in the medical record as appropriate.  Current Outpatient Medications   Medication Sig Dispense Refill    acyclovir (ZOVIRAX) 400 mg tablet Take 1 tablet (400 mg total) by mouth in the morning and 1 tablet (400 mg total) in the evening. 180 tablet 3    apixaban (ELIQUIS) 5 mg tablet Take 1 tablet (5 mg total) by mouth 2 (two) times daily      desvenlafaxine succinate (PRISTIQ) 25 mg 24 hr tablet TAKE 1 TABLET BY MOUTH EVERY DAY IN THE MORNING 90 tablet 1    dexAMETHasone (DECADRON) 4 mg tablet Take 5 tablets (20 mg total) by mouth every 7 (seven) days TAKE 5 TABLETS (20MG ) WEEKLY PRIOR TO iXAZOMIB. DO NOT TAKE FOR WEEK OFF IXAZOMIB 40 tablet 0    filgrastim-sndz (ZARXIO) 300 mcg/0.5 mL injection syringe Inject 0.5 mL (300 mcg total) under the skin daily as needed (for anc<1) 6 mL 0    ixazomib (NINLARO) 3 mg CAP Take 3 mg by mouth every  7 (seven) days TAKE EVERY & DAYS D1, D8 and D15 OF A 28 DAY CYCLE 18 capsule 0    ondansetron (ZOFRAN) 8 mg tablet Take 1 tablet (8 mg total) by mouth every 8 (eight) hours as needed for Nausea (Patient not taking: Reported on 10/03/2023)      pomalidomide (POMALYST) 2 mg capsule Take 1 capsule (2 mg total) by mouth nightly at bedtime FOR 21 DAYS ON AND 7 DAYS OFF 21 capsule 0     No current facility-administered medications for this visit.       ALLERGIES  Allergies/Contraindications   Allergen Reactions    Diphenoxylate-Atropine Abdominal Pain     Severe stomach pain     Caffeine Anxiety     "Jitters"       PHYSICAL EXAM- LIMIT EXAM BY VIDEO  Objective: ECOG1  Constitutional:  Pleasant over video  HENT:  Normocephalic  Neck- Supple.   SKIN: No rash noted  Eyes:  PERRL, EOMI, sclera appear clear  Neurologic:  Alert & interactive, nl affect, No focal deficits noted.       RESULTS: Labs in [facility] were reviewed. Lab results reviewed and interpretation provided to patient  Lab Results   Component Value Date    WBC Count 2.3 (LL) 11/22/2023    Hemoglobin 11.3 11/22/2023    Hematocrit 34.8 11/22/2023    MCV 89 11/22/2023    Platelet Count 177 11/22/2023     Lab Results   Component Value Date    Creatinine 0.55 (L) 11/22/2023    Creatinine (POCT) 0.8 06/05/2014         Outside Labs:    No results found.  ASSESSMENT & PLAN  1. Multiple Myeloma: Impression: established - stable - on therapy - requiring disease monitoring  This patient is on therapy and at high-risk for side effects and progression and requiring intensive monitoring. The patient's therapy is IPd: Ixaz 3 mg, pom 2 mg, Dex 20mg  3/4 weeks.     We reviewed the labs: SIFE, SPEP, SFLC, CBC => m-prot 0.7, IgG 1112, KLC 124.7, LLC 10.5. ANC 0.7. Received zarxio on 11/23/23. No sign of progression.     We discussed that she is doing well. The low ANC is expected on Pom and we should continue as needed G-CSF injections. Now with home supply of zarxio. Will continue to  follow MM numbers - closely.    Recent PET noted nodular thyroid with slight increase in hypermetabolism compared to prior - will follow up with bilateral thyroid u/s locally.  The patient has a high complexity problem: multiple myeloma, which is an incurable malignancy and the patient is receiving active therapy, which have side effects and poses a threat to life and bodily function.    Regarding data complexity, I have reviewed the following tests => SPEP, SIFE, SFLCs, CBC and CMP.      Medical Decision Making/ Plan: - Continue on IPD therapy   - Continue to follow myeloma protein levels every 4-6 weeks  - Follow-up in 4 weeks with Harriett Sine - 3 months MArtin.    2. Immunocompromised state/Infectious Disease Concerns: Impression: established - immunocompromised from disease/treatment:  The patient is immunocompromised and is at high-risk from infection due to therapy. Needs to be up-to-date on Covid vaccine. Has been sheltering from exposure. No exposures.    Requiring close follow-up for symptoms.  At risk for VZV reactivation and is on ACV. No current signs of infection. Will call with new signs of infection        Plan: - Continue prophylaxis  - yearly flu vaccine  - Shelter from Covid exposure  - The patient will call with any symptoms or signs of infection    3. Depression/Psychologic/Social Stressors: Impression: established - stable  The patient has cancer and has situation depression and is at risk for anxiety. Needs close follow-up    Currently, the attitude is good. There are no signs of anxiety or active depression. Mood appropriate, understands plan      Plan: continues on pristiq  - advised patient to follow up with Dr. Alain Honey regards anxiety attacks    4. Anemia: Impression: established - stable    The blood counts are stable. Next CBC in 1-2 weeks.       Plan: No transfusions needed.  - GCSF prn ANC <1    5. Neutropenia/Thrombocytopenia: Impression: established - stable    The treatment has  toxicity on the bone marrow - Has current signs of suppression and will need ongoing G-CSF.     Plan: No cytokines or transfusions    6. RI/ Electrolyte Replacement: Impression: established - stable     Taking ample fluids and avoiding nephrotoxins.  Lytes & Cr Okay, appears euvolemic. Appetite good and weight unchanged. No new medications needed.     Plan: Continue adequate fluid intake & balanced, nutritional diet   - Continue as much exercise as possible.  - No additional supplementation needed.    7. Osteopenia: Impression: established - stable:    The patient is at risk for fracture due to MM. We recommend Calcium and Vit D replacement. We also recommend an aggressive exercise program.    Plan: - no Biphosphonates    8. Secondary Cancer Risk: impression: established - NEEDS close follow-up  The patient has high-risk of secondary malignancies. Needs close follow-up and routine cancer screening.    Plan: - Derm follow-up yearly  - Colonoscopy every 5 years  - Screening per PCP.     Royal Piedra NP  Hematology, Blood & Marrow Transplant, and Cellular Therapy  [facility]  office [phone]  fax [phone]  12/18/23     APP Visit Information:   APP Service Type:  Independent  Available MD consultant:  Larina Earthly III, MD    I spent a total of 45 non-overlapping minutes on this patient's care on the day of their visit excluding time spent related to any billed procedures. This time includes time spent with the patient as well as  time spent documenting in the medical record, reviewing patient's records and tests, obtaining history, placing orders, communicating with other healthcare professionals, counseling the patient, family, or caregiver, and/or care coordination for the diagnoses above.

## 2023-12-21 MED ORDER — DESVENLAFAXINE SUCCINATE ER 25 MG TABLET,EXTENDED RELEASE 24 HR
25 | ORAL_TABLET | Freq: Every morning | ORAL | 1 refills | 90.00000 days | Status: DC
Start: 2023-12-21 — End: 2024-07-02

## 2023-12-27 MED ORDER — ZARXIO 300 MCG/0.5 ML INJECTION SYRINGE
300 | Freq: Every day | INTRAMUSCULAR | 0 refills | 3.00000 days | Status: DC | PRN
Start: 2023-12-27 — End: 2024-03-28

## 2024-01-04 MED ORDER — DEXAMETHASONE 4 MG TABLET
4 | ORAL_TABLET | ORAL | 0 refills | 6.00000 days | Status: DC
Start: 2024-01-04 — End: 2024-03-26

## 2024-01-10 NOTE — Progress Notes (Signed)
 Call from Dr Ernestene Kiel - radiologist [phone] regarding recent thyroid ultrasound and addendum to 11/14/23 PET.      Thyroid unremarkable - stable thyroid nodule - morphologically unchanged.    Addendum to PET: Left 2nd rib SUV incr from 1.8 to 3.

## 2024-01-12 MED ORDER — POMALYST 2 MG CAPSULE
2 mg | ORAL_CAPSULE | Freq: Every day | ORAL | 0 refills | Status: DC
Start: 2024-01-12 — End: 2024-02-06

## 2024-01-13 ENCOUNTER — Telehealth: Admit: 2024-01-13 | Discharge: 2024-01-13 | Payer: PRIVATE HEALTH INSURANCE | Attending: Nurse Practitioner

## 2024-01-13 DIAGNOSIS — C9000 Multiple myeloma not having achieved remission: Secondary | ICD-10-CM

## 2024-01-13 NOTE — Progress Notes (Signed)
 Ariel Braun  [id number]    Date of Service: 01/13/2024    Reason for visit / Chief Complaints:    Subjective   Ariel Braun is a 63 y.o. female with MM who is on VIDEO follow-up      Events since the last visit: The patient has been ON Ixa-Pd therapy. Has been doing OK. Occ will have "light-headedness" and jitteriness. Some fatigue. No infectious symptoms. No "hot spots" or new bone pain. No significant signs of progressive MM. No kidney/urinary difficuties. No f/chills, URI symptoms, sinus congestion, sore throat or other infectious symptoms. Has been sheltering. No N/V/dysuria/diarrhea.  No CP, palp.  No bleeding or bruising.    Continues to experience stress with dealing with son's health issues, has adenoma of brain and will need surgery. Continues to care for daughter, who has been ill and still no diagnosis.  She also cares for her husband who has diabetes.     Had additional anxiety attack x 2 since last visit, maybe less severe.  Attacks tend to come on after, rather than during the stressful event. Eyes get bloodshot afterwards. Often very exhausted after episodes.  Prior episodes occurred on 12/30, then again 1/27, lasting about 20-30 mins, felt suddenly flushed and diaphoretic, nauseated with coughing fit and gagging.  Sees Dr. Caffie Pinto in 2 weeks.    We discussed that she is Catholic, but has not been involved in her local church.  She would be interested in scripture and guidance with coping.    Feels pristiq has been helpful. Aroma therapy helps.     Oncology history:  DX:   IgG kappa myeloma, diagnosed 09/07/10   - BMBx: 8% plasma cells, normal cyto 46XX, FISH showed only 3 copies 1q21, Stage 2 ISS (3.88 beta 2 microglobulin),IgG 4750, M-spike 3.2, kappa 586.9 mg/L.     TREATMENT  1. LINE1: RVD 10/2010 through April 2011 (? 4 cycles). Achieved VGPR with M-spike 0.4, IgG 886, kappa 27.6.   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  M-spike 0.6, IgG 1270.     3. LINE2: 08/05/14: Started Rev 15 mg 3/4. M-spike 1, IgG 1640, kappa 62.2 - SD    Biochemical PD 04/17/19: M-1.4, KLC 96.3. ? PD. Up Rev to 15 mg qd (3/4) and dex 8 mg wkly(4/4).       07/20/19: L hip pain. M-1.2, KLC 96. Cont. Rev 15 mg (3/4)/dex 8 mg (4/4).     LINE4: Pom 3, Dara, Dex 8 (3/4).      Severe infection-sepsis 12/22 - all meds stopped. Admit [facility] 11/2021-01/2022  - CMV colitis, PE, Cholecystitis.  - Hospitalized in [facility] thru 6/2  - Hospitalized [place] 6/14 - 7/13 - colitis/[place]    LINE5: Ixa/Pom/dex Cycle #1 07/2023       Past Medical History:   Diagnosis Date    Acid reflux disease     Anxiety     Depression     Hereditary angioedema (CMS code)     Multiple myeloma, without mention of having achieved remission     IgG kappa    Myeloma (CMS code) 05/10/2011     Past medical, family and social histories, as well as medications and allergies, were reviewed and updated in the medical record as appropriate.    Current Outpatient Medications   Medication Sig Dispense Refill    acyclovir (ZOVIRAX) 400 mg tablet Take 1 tablet (400 mg  total) by mouth in the morning and 1 tablet (400 mg total) in the evening. 180 tablet 3    apixaban (ELIQUIS) 5 mg tablet Take 1 tablet (5 mg total) by mouth 2 (two) times daily      desvenlafaxine succinate (PRISTIQ) 25 mg 24 hr tablet TAKE 1 TABLET BY MOUTH EVERY DAY IN THE MORNING 90 tablet 1    dexAMETHasone (DECADRON) 4 mg tablet Take 5 tablets (20 mg total) by mouth every 7 (seven) days TAKE 5 TABLETS (20MG ) WEEKLY PRIOR TO iXAZOMIB. DO NOT TAKE FOR WEEK OFF IXAZOMIB 60 tablet 0    filgrastim-sndz (ZARXIO) 300 mcg/0.5 mL injection syringe Inject 0.5 mL (300 mcg total) under the skin daily as needed (for anc<1) 6 mL 0    ixazomib (NINLARO) 3 mg CAP Take 3 mg by mouth every 7 (seven) days TAKE EVERY & DAYS D1, D8 and D15 OF A 28 DAY CYCLE 18 capsule 0    ondansetron (ZOFRAN) 8 mg tablet Take 1 tablet (8 mg total) by mouth every 8 (eight) hours as  needed for Nausea (Patient not taking: Reported on 10/03/2023)      POMALYST 2 mg capsule TAKE 1 CAPSULE BY MOUTH NIGHTLY AT BEDTIME FOR 21 DAYS ON AND 7 DAYS OFF 21 capsule 0     No current facility-administered medications for this visit.       ALLERGIES  Allergies/Contraindications   Allergen Reactions    Diphenoxylate-Atropine Abdominal Pain     Severe stomach pain     Caffeine Anxiety     "Jitters"       PHYSICAL EXAM- LIMIT EXAM BY VIDEO  Objective: ECOG1  Constitutional:  Pleasant over video  HENT:  Normocephalic  Neck- Supple.   SKIN: No rash noted  Eyes:  PERRL, EOMI, sclera appear clear  Neurologic:  Alert & interactive, nl affect, No focal deficits noted.       RESULTS: Labs in [facility] were reviewed. Lab results reviewed and interpretation provided to patient  Lab Results   Component Value Date    WBC Count 2.2 (LL) 12/21/2023    Hemoglobin 11.4 12/21/2023    Hematocrit 35.0 12/21/2023    MCV 91 12/21/2023    Platelet Count 175 12/21/2023     Lab Results   Component Value Date    Creatinine 0.77 12/21/2023    Creatinine (POCT) 0.8 06/05/2014         Outside Labs:    No results found.  ASSESSMENT & PLAN  1. Multiple Myeloma: Impression: established - stable - on therapy - requiring disease monitoring  This patient is on therapy and at high-risk for side effects and progression and requiring intensive monitoring. The patient's therapy is IPd: Ixaz 3 mg, pom 2 mg, Dex 20mg  3/4 weeks.     We reviewed the labs: SIFE, SPEP, SFLC, CBC => m-prot 0.7, IgG 1112, KLC 124.7, LLC 10.5. ANC 0.6. No sign of progression, but need updated lab s.     We discussed that she is doing well. The low ANC is expected on Pom and we should continue as needed G-CSF injections. Now with home supply of zarxio. Will continue to follow MM numbers - closely.    Recent PET noted nodular thyroid with slight increase in hypermetabolism compared to prior. Reviewed thyroid ultrasound with radiologist Dr Thana Ates - stable compared with prior, no  morphological changes.    The patient has a high complexity problem: multiple myeloma, which is an incurable malignancy and the patient  is receiving active therapy, which have side effects and poses a threat to life and bodily function.    Regarding data complexity, I have reviewed the following tests => SPEP, SIFE, SFLCs, CBC and CMP.      Medical Decision Making/ Plan: - Continue on IPD therapy   - consider decr dex dose  - Continue to follow myeloma protein levels every 4-6 weeks  - Follow-up in 4 weeks with Harriett Sine - 3 months MArtin.    2. Immunocompromised state/Infectious Disease Concerns: Impression: established - immunocompromised from disease/treatment:  The patient is immunocompromised and is at high-risk from infection due to therapy. Needs to be up-to-date on Covid vaccine. Has been sheltering from exposure. No exposures.    Requiring close follow-up for symptoms.  At risk for VZV reactivation and is on ACV. No current signs of infection. Will call with new signs of infection        Plan: - Continue prophylaxis  - yearly flu vaccine  - Shelter from Covid exposure  - The patient will call with any symptoms or signs of infection    3. Depression/Psychologic/Social Stressors: Impression: established - stable  The patient has cancer and has situation depression and is at risk for anxiety. Needs close follow-up    Recent episodes of anxiety, described by patient as "panic attacks."        Plan: continues on pristiq  - advised patient to follow up with Dr. Alain Honey regards anxiety attacks  - consider decreasing dex dose    4. Anemia: Impression: established - stable    The blood counts are stable. Next CBC in 1-2 weeks.       Plan: No transfusions needed.  - GCSF prn ANC <1    5. Neutropenia/Thrombocytopenia: Impression: established - stable    The treatment has toxicity on the bone marrow - Has current signs of suppression and will need ongoing G-CSF.     Plan: No cytokines or transfusions    6. RI/  Electrolyte Replacement: Impression: established - stable     Taking ample fluids and avoiding nephrotoxins.  Lytes & Cr Okay, appears euvolemic. Appetite good and weight unchanged. No new medications needed.     Plan: Continue adequate fluid intake & balanced, nutritional diet   - Continue as much exercise as possible.  - No additional supplementation needed.    7. Osteopenia: Impression: established - stable:    The patient is at risk for fracture due to MM. We recommend Calcium and Vit D replacement. We also recommend an aggressive exercise program.    Plan: - no Biphosphonates    8. Secondary Cancer Risk: impression: established - NEEDS close follow-up  The patient has high-risk of secondary malignancies. Needs close follow-up and routine cancer screening.    Plan: - Derm follow-up yearly  - Colonoscopy every 5 years  - Screening per PCP.     Royal Piedra NP  Hematology, Blood & Marrow Transplant, and Cellular Therapy  [place] [facility]  office [phone]  fax [phone]  01/13/24     APP Visit Information:   APP Service Type:  Independent  Available MD consultant:  Larina Earthly III, MD       Complex Problem: Incurable malignancy, requires close monitoring for signs and symptoms of disease progression  Risk of complications, morbidity/mortality of patient management: high; the patient's systemic cancer therapy requires regular and intensive monitoring for potential major/life-threatening toxicities  Data review/interpretation: I reviewed and/or ordered 3+  tests and/or documents    Patient is on IPd.       I performed this consultation using real-time Telehealth tools, including a live video connection between my location and the patient's location. Prior to initiating the consultation, I obtained informed verbal consent to perform this consultation using Telehealth tools and answered all the questions about the Telehealth interaction. Patient in [place].

## 2024-01-13 NOTE — Patient Instructions (Addendum)
 Repeat labs now - new standing order for [facility] placed    Continue Ixazomib.Pom and Dex.    Consider reducing dex dose once labs reviewed and stable, due to anxiety episodes

## 2024-01-19 LAB — COMPLETE BLOOD COUNT WITH DIFFERENTIAL
% Basophils: 1 %
% Eosinophils: 2 %
% Lymphocytes: 56 %
% Monocytes: 14 %
% Neutrophils: 27 %
Abs Basophils: 0 10*3/uL (ref 0.0–0.2)
Abs Eosinophils: 0 10*3/uL (ref 0.0–0.4)
Abs Lymphocytes: 1.4 10*3/uL (ref 0.7–3.1)
Abs Monocytes: 0.3 10*3/uL (ref 0.1–0.9)
Abs Neutrophils: 0.7 10*3/uL — ABNORMAL LOW (ref 1.4–7.0)
Hematocrit: 36.2 % (ref 34.0–46.6)
Hemoglobin: 11.6 g/dL (ref 11.1–15.9)
MCH: 28.9 pg (ref 26.6–33.0)
MCHC: 32 g/dL (ref 31.5–35.7)
MCV: 90 fL (ref 79–97)
Platelet Count: 171 10*3/uL (ref 150–450)
RBC Count: 4.02 x10E6/uL (ref 3.77–5.28)
RDW-CV: 16.8 % — ABNORMAL HIGH (ref 11.7–15.4)
WBC Count: 2.4 10*3/uL — CL (ref 3.4–10.8)

## 2024-01-19 LAB — COMPREHENSIVE METABOLIC PANEL (BMP, AST, ALT, T.BILI, ALKP, TP ALB)
AST: 17 IU/L (ref 0–40)
Alanine transaminase: 13 IU/L (ref 0–32)
Albumin, Serum / Plasma: 3.8 g/dL — ABNORMAL LOW (ref 3.9–4.9)
Alkaline Phosphatase: 105 IU/L (ref 44–121)
BUN/Creatinine Ratio (External Lab): 26 (ref 12–28)
Bilirubin, Total: 0.7 mg/dL (ref 0.0–1.2)
Calcium, total, Serum / Plasma: 9.1 mg/dL (ref 8.7–10.3)
Carbon Dioxide, Total: 24 mmol/L (ref 20–29)
Chloride, Serum / Plasma: 110 mmol/L — ABNORMAL HIGH (ref 96–106)
Creatinine: 0.54 mg/dL — ABNORMAL LOW (ref 0.57–1.00)
Globulin, Total: 2.1 g/dL (ref 1.5–4.5)
Glucose, non-fasting: 88 mg/dL (ref 70–99)
Potassium, Serum / Plasma: 3.7 mmol/L (ref 3.5–5.2)
Protein, Total, Serum / Plasma: 5.9 g/dL — ABNORMAL LOW (ref 6.0–8.5)
Sodium, Serum / Plasma: 144 mmol/L (ref 134–144)
Urea Nitrogen, serum/plasma: 14 mg/dL (ref 8–27)
eGFRcr: 103 mL/min/{1.73_m2} (ref >59)

## 2024-01-20 LAB — LACTATE DEHYDROGENASE, BLOOD: LDH, serum/plasma: 162 IU/L (ref 119–226)

## 2024-01-23 LAB — PROTEIN ELECTROPHORESIS, SERUM
A/G Ratio: 0.9 (ref 0.7–1.7)
Albumin, sPEP: 2.9 g/dL (ref 2.9–4.4)
Alpha 1 Globulin: 0.3 g/dL (ref 0.0–0.4)
Alpha 2 Globulin: 0.7 g/dL (ref 0.4–1.0)
Beta Globulin: 1.1 g/dL (ref 0.7–1.3)
Gamma Globulin: 1 g/dL (ref 0.4–1.8)
Globulin, Total: 3.1 g/dL (ref 2.2–3.9)
M-Protein (monoclonal), Serum: 0.7 g/dL — ABNORMAL HIGH
Protein, Total, Serum / Plasma: 6 g/dL (ref 6.0–8.5)

## 2024-01-23 LAB — KAPPA AND LAMBDA FREE LIGHT CHAINS, SERUM
Kappa Light Chain, Serum, Free: 82.2 mg/L — ABNORMAL HIGH (ref 3.3–19.4)
Kappa/Lambda Ratio, Serum, Free: 9.56 — ABNORMAL HIGH (ref 0.26–1.65)
Lambda Light Chain, Serum, Free: 8.6 mg/L (ref 5.7–26.3)

## 2024-01-23 LAB — IMMUNOFIXATION ELECTROPHORESIS, SERUM
IgA, Serum: 56 mg/dL — ABNORMAL LOW (ref 87–352)
IgG: 967 mg/dL (ref 586–1602)
IgM, Serum: 26 mg/dL (ref 26–217)

## 2024-01-23 LAB — CYTOMEGALOVIRUS DNA, QUANTITATIVE PCR, PLASMA: Cytomegalovirus DNA, Quantitative PCR: NEGATIVE [IU]/mL

## 2024-01-24 ENCOUNTER — Telehealth: Admit: 2024-01-24 | Discharge: 2024-01-24 | Payer: PRIVATE HEALTH INSURANCE

## 2024-01-24 DIAGNOSIS — F410 Panic disorder [episodic paroxysmal anxiety] without agoraphobia: Secondary | ICD-10-CM

## 2024-01-24 MED ORDER — PROPRANOLOL 10 MG TABLET
10 | ORAL_TABLET | Freq: Three times a day (TID) | ORAL | 1 refills | Status: DC | PRN
Start: 2024-01-24 — End: 2024-01-25

## 2024-01-24 NOTE — Progress Notes (Signed)
 Subjective    Ariel Braun is a 63 y.o. female with multiple myeloma (diagnosis category: Multiple myeloma (including amyloidosis)  metastatic) who is being seen via video visit while patient is at home at the request of Dr. Dolores Lory of (referral category: Oncology/Rad Onc/Onc Surgery) for symptom management.    Medical team: PCP Dr. Jannetta Quint, oncologist Dr. Delaney Meigs.  Hepatologist Dr. Newman Nip.    Primary caregiver(s):  Her daughter  Palliative care team member disciplines: Physician    HPI  Ariel Braun is a 63 y.o. female with pain, occasional episodes of constipation, improved anxiety with mental stressors and good appetite.     Today patient/family reports:     Having what she feels are panic/anxiety attacks.  Last 2 not as bad and not lasting as long but still a struggle.  Will feel tingling inside mouth when this starts and knows something is coming.  Feels flushed feeling around head and down chest and abd.  Gets sweaty.  Eyes turn red.  Builds up and starts to cough.  Sometimes nausea and sometimes not.  Lasts 10-15 mins.  If remains calm and says composed, will ride it out.  Happens after string of stressful events. Son is meeting with surgeons.  Thyroid nodule that could turn into cancer.  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  [name] is losing medical coverage via employer. Has same symptoms.  Have not gotten answers for her despite eval at [facility] and [facility], naturopath evaluation.  Her myeloma team may be reducing dose of Dex.   Not sure if attacks more frequent a few days post Dex?  Can check.  Has had 4 attacks total.   Using prayer and breathing, exercise.  Symptoms peak in 10-15 mins when she starts to cough.  Coughs up clear phlegm.  Like a hot flash but more intense.  Flushing is not visible but she is sweating.  No diarrhea. Sometimes, will have to go have urgent BM right towards the end of the spell. Not diarrhea.      -she ties to keep going and get  things done despite a lot of medical issues in her family but then things will catch up with her later and she will feel them.       -still taking Pristiq 25mg  qAM.  Says this drug has been a life saver for her- given her clarity she has not had.  More confident and assertive.  Takes initiative more.  Talking about her situation with healthcare providers helps her.  Says being the eyes and ears for his kids is very important  for her.  Wants to be a grandmother.  Has tried therapy 3 times.  Was not very helpful.  Thinks she never found right person.  Worked with one therapist for 1 year and felt more confused after working with them.   Needs therapist who has same [place] beliefs she has.  Says they have to have some connection with and attempt to understand the Lord.      Cancer-directed treatment:  - Currently undergoing cancer treatment with IPd therapy. Finds that the symptoms are manageable and reports occasional episodes of shakiness.  - Her WBC tends to decline towards the end of her cancer treatment cycle and uses Zarxio and Nivestym injection q14h. Was previously visiting [place] for receiving injections q7d.    Social history most significant for:  Lives with husband, mom and son, who has intellectual disability.  Isolated.  Cares for son along with help from daughter, whom she is close to.    Daughter also lives with her.    She reports she is in a caregiver role in her family.      Spiritual:  - Born and raised Catholic but tends to gain more spiritual information and guidance from principles that are Christian-based and Bible-based and applies those principles and traditions in real life.  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    - She and her daughter watch a lot of [employer] programs that are uplifting and feels that it provides positive direction and helps her focus on good things in her life. Prays and reads the Bible and states that this helps.    Fam hx: Strong fam hx of addiction.  Father was  alcoholic.  Mom used valium to cope.  Brothers with drug and alcohol addiction.      Patient/Family screened for spiritual care needs: Yes  Patient/Family screened for psychosocial needs: Yes    SMS Well-Being Survey      06/26/2023     9:37 PM 08/03/2023     9:49 AM 01/23/2024     4:14 PM   SMS Well-Being Scores   Pain  8 0   Shortness of breath  0 0   Constipation 8 8 3    Tiredness  5 7   Nausea  0 0   Depression  3 0   Anxiety 7 7 8    Drowsiness  7    Appetite 0 9 0   Feeling of wellbeing 5 7 3    Other problem Sometimes feel shakiness (inside feeling not trimmers)  At times feel like it's too much med.     "I feel at peace." A moderate amount A moderate amount A moderate amount   "At times I worry I will be a burden to my family." A moderate amount A moderate amount Not at all   How would you rate your overall quality of life? Ariel Braun   DPOA I have an Advanced Health Care Directive Health Care Directive                 Objective    Physical Exam:   NAD  Sitting on couch  Normal neck ROM  Normal resp effort    Performance status (by Palliative Performance Scale): 70% - Reduced ambulation, Unable Normal Job/Work, Significant disease, Occasional assistance necessary, Normal or reduced intake, Full conscious level      Review of Prior Testing  I have personally reviewed and interpreted the following studies:  Lab Results   Component Value Date    WBC Count 2.4 (LL) 01/19/2024    Hemoglobin 11.6 01/19/2024    Hematocrit 36.2 01/19/2024    MCV 90 01/19/2024    Platelet Count 171 01/19/2024     Lab Results   Component Value Date    Sodium, Serum / Plasma 144 01/19/2024    Potassium, Serum / Plasma 3.7 01/19/2024    Urea Nitrogen, serum/plasma 14 01/19/2024    Creatinine 0.54 (L) 01/19/2024     Lab Results   Component Value Date    Thyroid Stimulating Hormone 3.98 12/15/2021     No results found.    #  Cancer stage at diagnosis- see onc note for staging.      Assessment and Plan    Ariel Braun is a 63 y.o. 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      Likely panic attack- she has a lot of physical symptoms during these episodes.    -have spoken to Delaney Meigs and myeloma team plans to dose reduce Dex slightly if safe to do so.  Abbott Pao that Dex can worsen anxiety.  We discussed increasing Pristiq but would like to dose reduce Dex 1st if possible.    -she declines benzos (has tried a few in the past) due to addiction risk  -can take 5mg  of Propanolol tid prn panic attacks.  Can take 10mg  if 5mg  ineffective.  Suspect BP and HR elevated during episode and the dose is low so doubt it will cause any hypotension.    -list of Christian therapists provided local to her in [place] from [employer] website.  Advised she could likely see them via video.    -she asks Clinical research associate for more freq follow up and finds the visits helpful.  RTC 4/30    Cancer-directed treatment: Currently undergoing cancer treatment with IPd therapy. Finds that the symptoms are manageable.  - Her WBC tends to decline towards the end of her cancer treatment cycle and uses Zarxio and Nivestym injection q14h. Was previously visiting [place] for receiving injections q7d.  Previously discussed:  - Currently on Ninalro/Pom/Dex. Discussed that steroids like dexamethasone can increase her anxiety, given that she continues to have mental stressors.    Advance Care Planning   - Prognostic awareness: N/A  - Relevant values/priorities: Reduction in anxiety, care for her son.    - Surrogate decision maker: Not addressed  - Resuscitation preferences: Unknown  -  AD: Deferred  - POLST: Deferred    - GOC convo deferred to future visit  - Today I spent 0 minutes with the patient specifically discussing ACP.     Time Spent  I spent a total of 43 minutes on this patient's care on the day of their visit excluding time spent related  to any billed procedures. This time includes time spent with the patient as well as time spent documenting in the medical record, reviewing patient's records and tests, obtaining history, placing orders, communicating with other healthcare professionals, counseling the patient, family, or caregiver, and/or care coordination for the diagnoses above.    I performed this evaluation using real-time telehealth tools, including a live video Zoom connection between my location and the patient's location. Prior to initiating, the patient consented to perform this evaluation using telehealth tools.        Thank you for allowing [place] to participate in the care of your patient. Please feel free to reach out with any questions, concerns or ideas about additional ways we could serve your patient & their family.    Follow-up: March 14, 2024 at 12:00 PM.    Marzella Schlein, MD        The above scribed documentation as annotated by me accurately reflects the services I have provided.   Marzella Schlein, MD  10/24/2023 8:16 PM

## 2024-01-25 MED ORDER — PROPRANOLOL 10 MG TABLET
10 | ORAL_TABLET | Freq: Three times a day (TID) | ORAL | 1 refills | 30.00000 days | Status: DC | PRN
Start: 2024-01-25 — End: 2024-03-01

## 2024-02-06 MED ORDER — POMALYST 2 MG CAPSULE
2 | ORAL_CAPSULE | Freq: Every day | ORAL | 0 refills | Status: DC
Start: 2024-02-06 — End: 2024-03-07

## 2024-02-17 LAB — COMPREHENSIVE METABOLIC PANEL (BMP, AST, ALT, T.BILI, ALKP, TP ALB)
AST: 18 IU/L (ref 0–40)
Alanine transaminase: 13 IU/L (ref 0–32)
Albumin, Serum / Plasma: 4.2 g/dL (ref 3.9–4.9)
Alkaline Phosphatase: 109 IU/L (ref 44–121)
BUN/Creatinine Ratio (External Lab): 32 — ABNORMAL HIGH (ref 12–28)
Bilirubin, Total: 0.8 mg/dL (ref 0.0–1.2)
Calcium, total, Serum / Plasma: 9.3 mg/dL (ref 8.7–10.3)
Carbon Dioxide, Total: 23 mmol/L (ref 20–29)
Chloride, Serum / Plasma: 110 mmol/L — ABNORMAL HIGH (ref 96–106)
Creatinine: 0.53 mg/dL — ABNORMAL LOW (ref 0.57–1.00)
Globulin, Total: 2.1 g/dL (ref 1.5–4.5)
Glucose, non-fasting: 85 mg/dL (ref 70–99)
Potassium, Serum / Plasma: 3.5 mmol/L (ref 3.5–5.2)
Protein, Total, Serum / Plasma: 6.3 g/dL (ref 6.0–8.5)
Sodium, Serum / Plasma: 143 mmol/L (ref 134–144)
Urea Nitrogen, serum/plasma: 17 mg/dL (ref 8–27)
eGFRcr: 103 mL/min/{1.73_m2} (ref >59)

## 2024-02-17 LAB — COMPLETE BLOOD COUNT WITH DIFFERENTIAL
% Basophils: 0 %
% Eosinophils: 0 %
% Lymphocytes: 44 %
% Monocytes: 14 %
% Neutrophils: 42 %
Abs Basophils: 0 10*3/uL (ref 0.0–0.2)
Abs Eosinophils: 0 10*3/uL (ref 0.0–0.4)
Abs Lymphocytes: 1.1 10*3/uL (ref 0.7–3.1)
Abs Monocytes: 0.4 10*3/uL (ref 0.1–0.9)
Abs Neutrophils: 1.1 10*3/uL — ABNORMAL LOW (ref 1.4–7.0)
Hematocrit: 36.3 % (ref 34.0–46.6)
Hemoglobin: 11.9 g/dL (ref 11.1–15.9)
MCH: 29.4 pg (ref 26.6–33.0)
MCHC: 32.8 g/dL (ref 31.5–35.7)
MCV: 90 fL (ref 79–97)
Platelet Count: 166 10*3/uL (ref 150–450)
RBC Count: 4.05 x10E6/uL (ref 3.77–5.28)
RDW-CV: 16.7 % — ABNORMAL HIGH (ref 11.7–15.4)
WBC Count: 2.5 10*3/uL — CL (ref 3.4–10.8)

## 2024-02-20 LAB — PROTEIN ELECTROPHORESIS, SERUM

## 2024-02-20 LAB — KAPPA AND LAMBDA FREE LIGHT CHAINS, SERUM
Kappa Light Chain, Serum, Free: 68.8 mg/L — ABNORMAL HIGH (ref 3.3–19.4)
Kappa/Lambda Ratio, Serum, Free: 7.24 — ABNORMAL HIGH (ref 0.26–1.65)
Lambda Light Chain, Serum, Free: 9.5 mg/L (ref 5.7–26.3)

## 2024-02-20 LAB — IMMUNOFIXATION ELECTROPHORESIS, SERUM
IgA, Serum: 60 mg/dL — ABNORMAL LOW (ref 87–352)
IgG: 919 mg/dL (ref 586–1602)
IgM, Serum: 26 mg/dL (ref 26–217)

## 2024-02-20 LAB — CYTOMEGALOVIRUS DNA, QUANTITATIVE PCR, PLASMA: Cytomegalovirus DNA, Quantitative PCR: NEGATIVE [IU]/mL

## 2024-02-23 ENCOUNTER — Telehealth: Admit: 2024-02-23 | Discharge: 2024-02-23 | Payer: PRIVATE HEALTH INSURANCE | Attending: Transplant Hepatology

## 2024-02-23 DIAGNOSIS — D849 Immunodeficiency, unspecified: Secondary | ICD-10-CM

## 2024-02-23 NOTE — Progress Notes (Deleted)
 Ariel Braun is a 63 y.o. female seen for an follow-up consultation at the request of Dr. Marlow Sin for chronic liver disease restarting myeloma therapy.    I performed this evaluation using real-time telehealth tools, including a live video Zoom connection between my location and the patient's location. Prior to initiating, the patient consented to perform this evaluation using telehealth tools.    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 In 05/2022 during a hospitalization she had ongoing ascites requiring weekly LVP in the setting of protein caloric malnutrition s/p PEG-dependent on tube feeds. Hepatology was consulted for further management of her ascites, which was believed to be multifactorial but more driven by her malnutrition than her portal hypertension.     To review, history is notable for severe diarrhea secondary to norovirus treated with nitazoxanide  x 2) complicated by hypovolemic shock and acute liver injury in 12/02/2021, transferred to [place] with prolonged hospital course (1/25-01/15/2022). Her transjugular liver biopsy on 12/11/21 showed mild HVPG of 8 mmHg and steatohepatitis with pericentral/sinusoidal and periportal fibrosis, along with ductal reaction with focal pericholangitis, suggestive of drug-induced liver injury in the setting of underlying MASH with possible culprit including thalidomide derivatives (pomalidomide ) and dexamethasone  (which can exacerbation of pre-existing MASH). Flexible sigmoidoscopy on 12/17/2021 showed CMV colitis and was treated with  ganciclovir  followed by letemovir due to bone marrow suppression. 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 Her ascites fluid studies was notable for high SAAG >1.1 and low protein <1.5 suggestive of portal hypertension (mild elevated HVPG of 8 mmHg [WHVP 16 mmHg - FHVP 8 mmHg]on 12/11/21). She has no history of SBP. Her imaging with CTAP on 12/14/21 showed marked steatosis along with US  doppler on 12/11/21 showing steatosis with portal vein diameter of 15 mm. US  abdomen on 12/30/21 showed steatosis with normal surface contour. US  with doppler on 05/16/22 showed mild liver surface nodularity compatible with early cirrhosis with sequela of portal hypertension including moderate ascites and splenomegaly.  She was discharged to a SNF for an extended period of time.    She has been off tube feeds since 06/2022 and has been at home since 09/2022, no longer needing home services. Her diarrhea has completely resolved. Her ascites and edema have resolved. She stopped rifaximin  and ursodiol  earlier in April.     Interval History: She was last seen in hepatology clinic in 08/2023 *** 05/2023 just prior to starting Ixazomib, cyclophosphamide and medrol  for MM.    She started C1D1 07/06/23 and developed neutropenia 9/11 thus received zarxio  x2. Liver enzymes showed mild ALP up to 140 9/18 which has since normalized, normal ALT, AST, total bilirubin.    She is very relieved to hear about her liver tests and reassured about safety of filgrastim  and pristiq  on her liver health.    Past Medical History:   Diagnosis Date    Acid reflux disease     Anxiety     Depression  Hereditary angioedema (CMS code)     Multiple myeloma, without mention of having achieved remission     IgG kappa    Myeloma (CMS code) 05/10/2011       Past  Surgical History:   Procedure Laterality Date    AUTOLOGOUS STEM CELL TRANSPLANTATION  05/12/11    BREAST CYST EXCISION  1981       Current Outpatient Medications   Medication Sig Dispense Refill    acyclovir  (ZOVIRAX ) 400 mg tablet Take 1 tablet (400 mg total) by mouth in the morning and 1 tablet (400 mg total) in the evening. 180 tablet 3    apixaban  (ELIQUIS ) 5 mg tablet Take 1 tablet (5 mg total) by mouth 2 (two) times daily      desvenlafaxine  succinate (PRISTIQ ) 25 mg 24 hr tablet TAKE 1 TABLET BY MOUTH EVERY DAY IN THE MORNING 90 tablet 1    dexAMETHasone  (DECADRON ) 4 mg tablet Take 5 tablets (20 mg total) by mouth every 7 (seven) days TAKE 5 TABLETS (20MG ) WEEKLY PRIOR TO iXAZOMIB. DO NOT TAKE FOR WEEK OFF IXAZOMIB 60 tablet 0    filgrastim -sndz (ZARXIO ) 300 mcg/0.5 mL injection syringe Inject 0.5 mL (300 mcg total) under the skin daily as needed (for anc<1) 6 mL 0    ixazomib (NINLARO ) 3 mg CAP Take 3 mg by mouth every 7 (seven) days TAKE EVERY & DAYS D1, D8 and D15 OF A 28 DAY CYCLE 18 capsule 0    ondansetron  (ZOFRAN ) 8 mg tablet Take 1 tablet (8 mg total) by mouth every 8 (eight) hours as needed for Nausea (Patient not taking: Reported on 10/03/2023)      POMALYST  2 mg capsule TAKE 1 CAPSULE BY MOUTH NIGHTLY AT BEDTIME FOR 21 DAYS ON AND 7 DAYS OFF 21 capsule 0    propranoloL  (INDERAL ) 10 mg tablet Take 0.5-1 tablets (5-10 mg total) by mouth 3 (three) times daily as needed (for panic attacks.  Take at 1st sign of attack) 30 tablet 1     No current facility-administered medications for this visit.       Allergies: She is allergic to diphenoxylate -atropine  and caffeine.    Social History:   Social History     Socioeconomic History    Marital status: Married     Spouse name: Not on file    Number of children: Not on file    Years of education: Not on file    Highest education level: Not on file   Occupational History    Not on file   Tobacco Use    Smoking status: Unknown    Smokeless tobacco: Not on file    Substance and Sexual Activity    Alcohol use: Never    Drug use: Never    Sexual activity: Not Currently   Other Topics Concern    Not on file   Social History Narrative    Not on file     Social Drivers of Health     Financial Resource Strain: Low Risk  (02/28/2023)    Overall Financial Resource Strain (CARDIA)     Difficulty of Paying Living Expenses: Not hard at all   Food Insecurity: No Food Insecurity (02/28/2023)    Hunger Vital Sign     Worried About Running Out of Food in the Last Year: Never true     Ran Out of Food in the Last Year: Never true   Transportation Needs: No Transportation Needs (02/28/2023)    PRAPARE - Transportation  Lack of Transportation (Medical): No     Lack of Transportation (Non-Medical): No   Housing Stability: Low Risk  (02/28/2023)    Housing Stability Vital Sign     Unable to Pay for Housing in the Last Year: No     Number of Places Lived in the Last Year: 1     Unstable Housing in the Last Year: No       Family History: Her family history includes Colon cancer in her father; Diabetes in her mother; Hypertension in her brother and mother.  Family history is otherwise negative or as noted above.     Physical Exam:  Vital Signs: There were no vitals taken for this visit.  Constitutional: Patient appears well-developed and well-nourished. Pleasant and appropriately interactive.   Eyes: Anicteric sclera  Pulmonary/Chest: Effort normal. No respiratory distress. No cough.  Neurological: Alert and oriented to person, place, and time.  Skin: No rash.     Records Review: I have personally reviewed the patient's medical records. These are summarized within the history of present illness.    Labs: I have personally reviewed and interpreted the following laboratory studies:    CIRRHOSIS MANAGEMENT          Latest Ref Rng & Units 08/25/2023    08:32 08/30/2023    08:39 09/14/2023    09:08 09/28/2023    07:51 10/04/2023    08:00 10/25/2023    10:04 11/22/2023    08:27 12/21/2023    08:04   Labs    WBC Count 3.4 - 10.8 x10E3/uL 2.1  3.3  3.3  2.8  3.2  2.8  2.3  2.2    RBC Count 3.77 - 5.28 x10E6/uL 3.83  3.79  3.85  3.99  4.06  3.82  3.91  3.85    Hemoglobin 11.1 - 15.9 g/dL 56.2  13.0  86.5  78.4  11.9  11.4  11.3  11.4    Hematocrit 34.0 - 46.6 % 35.8  34.9  35.9  36.0  36.9  35.2  34.8  35.0    MCV 79 - 97 fL 94  92  93  90  91  92  89  91    MCH 26.6 - 33.0 pg 30.0  29.6  30.1  29.8  29.3  29.8  28.9  29.6    MCHC 31.5 - 35.7 g/dL 69.6  29.5  28.4  13.2  32.2  32.4  32.5  32.6    Platelet Count 150 - 450 x10E3/uL 148  177  145  180  154  174  177  175    Sodium 134 - 144 mmol/L 141  143  142  142  140  141  142  142    Creatinine 0.57 - 1.00 mg/dL 4.40  1.02  7.25  3.66  0.57  0.55  0.55  0.77    Bilirubin, Total 0.0 - 1.2 mg/dL 0.7  0.7  0.9  0.7  0.9  0.7  0.6  0.7    Albumin , Serum / Plasma 3.9 - 4.9 g/dL 3.8  4.0  3.9  4.2  4.1  3.9  4.1  4.1          01/19/2024    08:05 02/16/2024    08:39   Labs   WBC Count 2.4  2.5    RBC Count 4.02  4.05    Hemoglobin 11.6  11.9    Hematocrit 36.2  36.3    MCV 90  90  MCH 28.9  29.4    MCHC 32.0  32.8    Platelet Count 171  166    Sodium 144  143    Creatinine 0.54  0.53    Bilirubin, Total 0.7  0.8    Albumin , Serum / Plasma 3.8  4.2      .AMB SYNOPSIS NAFLD MANAGEMENT          Latest Ref Rng & Units 08/25/2023    08:32 08/30/2023    08:39 09/14/2023    09:08 09/28/2023    07:51 10/04/2023    08:00 10/25/2023    10:04 11/22/2023    08:27 12/21/2023    08:04   Labs   WBC Count 3.4 - 10.8 x10E3/uL 2.1  3.3  3.3  2.8  3.2  2.8  2.3  2.2    RBC Count 3.77 - 5.28 x10E6/uL 3.83  3.79  3.85  3.99  4.06  3.82  3.91  3.85    Hemoglobin 11.1 - 15.9 g/dL 27.2  53.6  64.4  03.4  11.9  11.4  11.3  11.4    Hematocrit 34.0 - 46.6 % 35.8  34.9  35.9  36.0  36.9  35.2  34.8  35.0    MCV 79 - 97 fL 94  92  93  90  91  92  89  91    MCH 26.6 - 33.0 pg 30.0  29.6  30.1  29.8  29.3  29.8  28.9  29.6    MCHC 31.5 - 35.7 g/dL 74.2  59.5  63.8  75.6  32.2  32.4  32.5  32.6    Platelet Count  150 - 450 x10E3/uL 148  177  145  180  154  174  177  175    Creatinine 0.57 - 1.00 mg/dL 4.33  2.95  1.88  4.16  0.57  0.55  0.55  0.77    AST 0 - 40 IU/L 20  21  18  19  13  18  18  15     ALT 0 - 32 IU/L 19  18  19  18  21  20  15  13     Bilirubin, Total 0.0 - 1.2 mg/dL 0.7  0.7  0.9  0.7  0.9  0.7  0.6  0.7    Alkaline Phosphatase 44 - 121 IU/L 115  116  115  133  95  135  127  107    Albumin , Serum / Plasma 3.9 - 4.9 g/dL 3.8  4.0  3.9  4.2  4.1  3.9  4.1  4.1          01/19/2024    08:05 02/16/2024    08:39   Labs   WBC Count 2.4  2.5    RBC Count 4.02  4.05    Hemoglobin 11.6  11.9    Hematocrit 36.2  36.3    MCV 90  90    MCH 28.9  29.4    MCHC 32.0  32.8    Platelet Count 171  166    Creatinine 0.54  0.53    AST 17  18    ALT 13  13    Bilirubin, Total 0.7  0.8    Alkaline Phosphatase 105  109    Albumin , Serum / Plasma 3.8  4.2      Serologies:  Hep A IgG reactive (12/11/2021)  HBsAg negative (12/11/2021)  Anti-HBs positive (12/11/2021)  Anti-HBc negative (12/11/2021)  HIV  negative (12/15/2021)  HCV Ab negative (02/12/2011), negative (12/11/2021)    Studies: I have reviewed the following studies:    PET CT 01/18/2023: Impression:  -Stable appearance of mixed lytic and sclerotic lesions within the thoracolumbar spine and pelvis. No evidence for interval hypermetabolic skeletal pathology  -Stable nodular thyroid with interval decrease in metabolic activity  -Inflammatory changes within the lower lobes of both lungs     05/16/2022: IMPRESSION:   1.  Mild liver surface nodularity compatible with early cirrhosis and/or fibrosis. Patent vasculature with normal direction of flow.  2.  Sequela of portal hypertension including increased now moderate ascites and splenomegaly.  3.  Cholelithiasis.    Liver biopsy 12/11/2021: FINAL PATHOLOGIC DIAGNOSIS   Liver, transjugular biopsy:  1.  Steatohepatitis with pericentral/sinusoidal and periportal fibrosis; see comment.  2.  Ductular reaction with focal pericholangitis; see  comment.  COMMENT:  The biopsy shows severe steatosis with pericentral/sinusoidal and  periportal fibrosis, histiocytic and neutrophilic lobular inflammation,  Mallory hyaline as well as rare ballooned hepatocytes. 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  Clinical correlation with steatohepatitis risk factors is suggested,  including for any history of massive weight loss which may lead to an  aggressive onset of steatohepatitis [1].  The history of dexamethasone   use is noted; corticosteroid use has been associated with exacerbation  of pre-existing fatty liver disease [2].     Additionally, there is ductular reaction with focal pericholangitis,  raising consideration for a duct obstructive process. Clinical  correlation is advised. The concern for other drug-induced liver injury  is noted. In particular, thalidomide derivatives (such as pomalidomide )  have been reported in association with vanishing bile duct syndrome  (VBDS) [3]; however, there is no convincing duct loss to suggest VBDS.  There are no clusters of plasma cells seen to suggest involvement by  myeloma.    Microscopic findings  Adequacy: 9 portal tracts.   Portal tracts: Mild portal inflammation with histiocytes and lymphocytes, no interface activity, mild ductular reaction with focal pericholangitis. Interlobular bile ducts show mild duct injury without  overt duct loss.   Hepatic parenchyma: Mild lobular inflammation with histiocytes and neutrophils, rare spotty hepatocellular injury/necrosis, severe steatosis with neutrophilic satellitosis (grade 3, scale 0-3), rare  hepatocellular ballooning, extensive Mallory hyaline deposition, rare cholestasis, no granulomas, no viropathic changes, and no sinusoidal changes.    Immunohistochemical and histochemical stains (block A1):  - Trichrome:      Highlights mild to moderate pericellular/sinusoidal fibrosis and periportal fibrosis (stage 2, scale  0-4,NASH-CRN method).  - Iron :  1+ focal staining in scattered Kupffer cells.  - PASD:  No cytoplasmic globules typical of alpha-1-antitrypsin deficiency.  - CK8/18: Highlights Mallory hyaline within hepatocytes.  - CK7: Highlights mild ductular reaction and intact interlobular bile ducts. No periportal hepatocyte staining to support chronic cholestatic effect.  - CD68: Highlights scattered portal and lobular macrophages. No staining in hepatocytes with Mallory hyaline (indicates that these cells are not macrophages).  - [place] Red:     Negative, no support for hepatic amyloidosis.     ASSESSMENT AND PLAN:  Ariel Braun is a 63 y.o. 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    Chronic liver disease: Stage 2 fibrosis with Steatohepatitis and Ductular reaction with focal pericholangitis on liver biopsy in 11/2021. Her liver enzymes have normalized and ascites resolved with improvement in nutrition/albumin. Agree with holding the rifaximin and the ursodiol.     Multiple myeloma: Planning to start Ixazomib, Cytoxan, Medrol. Reviewed in [facility] liver tox:  Ixazomib: In large clinical trials of ixazomib combined with lenalidomide and dexamethasone, elevations in serum aminotransferase levels were common, occurring in ~10% of patients. However, values greater than 5 times the upper limit of normal (ULN) were rare, occurring in <1% of recipients. Cases of clinically apparent liver injury including acute liver failure have been reported in patients receiving ixazomib,  however in many instances multiple concomitant medications were being taken and the specific role of ixazomib in causing the liver injury was not always clear. The clinical features of cases of liver injury attributed to ixazomib have not been defined in any detail. Hepatotoxicity is listed as a warning in the product label for ixazomib and monitoring of serum enzymes during treatment is recommended.  Cyclophosphamide: Mild and transient elevations in serum aminotransferase levels are found in up to 43% of patients with cancer who are treated with cyclophosphamide. The abnormalities are generally asymptomatic and transient and do not require dose modification. Enzyme elevations are more common with higher doses and with intravenous therapy. In some instances, marked elevations arise warranting dose modification or discontinuation of cyclophosphamide. Clinically apparent liver injury from standard doses of cyclophosphamide is uncommon, but several case reports of acute liver injury with jaundice have been published. The onset is within 2 to 8 weeks of starting cyclophosphamide and the pattern of serum enzyme elevations is hepatocellular. Immunoallergic and autoimmune features are uncommon. The injury in most cases is self-limited and resolves within 1 to 3 months of stopping; however, fatal instances have been reported. Recurrence on reexposure has been described.  Medrol: Corticosteroid therapy can cause hepatic steatosis and hepatic enlargement, but this is often not clinically apparent, particularly in adults. This effect can occur quite rapidly and is rapidly reversed with discontinuation. High doses and long term use has been associated with the development or exacerbation of nonalcoholic steatohepatitis with elevations in serum aminotransferase levels and liver histology resembling alcoholic hepatitis with steatosis, chronic inflammation, centrolobular ballooning degeneration and Mallory bodies. However,  symptomatic or progressive liver injury from corticosteroid induced steatohepatitis is uncommon. Furthermore, corticosteroids may act to worsen an underlying nonalcoholic fatty liver disease rather than causing the condition de novo. The worsening may be due to direct effects of glucocorticoids on insulin resistance or fatty acid metabolism or may be the result of weight gain which is common with long term corticosteroid therapy. While simple steatosis induced by corticosteroids is rapidly reversible, steatohepatitis can be slow to resolve upon withdrawal of corticosteroids. She has now started treatment and reassuringly liver tests remain normal    Concerns about mirtazepine and desvenlafaxine: Both have low risk of clinically significant liver injury. Mirtazepine is associated with elevated liver enzymes in ~10% of cases vs 1% with desvenlafaxine, but she has been stable on mirtazepine for several months. Because she also had concerns about other toxicities with mirtazapine, recommended desvenlafaxine from a liver perspective, which she has been tolerating well.    Surveillance for hepatocellular carcinoma: Liver biopsy in 11/2021 showed F2 fibrosis, but imaging in July 2023 concerning for advanced fibrosis (complicated by  multifactorial ascites). Discussed with patient pros/cons of monitoring for [facility]- decided to proceed with q6 month surveillance. I have ordered an AFP and abdominal U/S.     Immunity: She is hepatitis A and B immune. Anti-HBc negative.    Follow-up: as needed

## 2024-02-23 NOTE — Progress Notes (Signed)
 Ariel Braun is a 63 y.o. female seen for an follow-up consultation at the request of Dr. Marlow Sin for chronic liver disease restarting myeloma therapy.    I performed this evaluation using real-time telehealth tools, including a live video Zoom connection between my location and the patient's location. Prior to initiating, the patient consented to perform this evaluation using telehealth tools.    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 In 05/2022 during a hospitalization she had ongoing ascites requiring weekly LVP in the setting of protein caloric malnutrition s/p PEG-dependent on tube feeds. Hepatology was consulted for further management of her ascites, which was believed to be multifactorial but more driven by her malnutrition than her portal hypertension.     To review, history is notable for severe diarrhea secondary to norovirus treated with nitazoxanide x 2) complicated by hypovolemic shock and acute liver injury in 12/02/2021, transferred to [place] with prolonged hospital course (1/25-01/15/2022). Her transjugular liver biopsy on 12/11/21 showed mild HVPG of 8 mmHg and steatohepatitis with pericentral/sinusoidal and periportal fibrosis, along with ductal reaction with focal pericholangitis, suggestive of drug-induced liver injury in the setting of underlying MASH with possible culprit including thalidomide derivatives (pomalidomide) and dexamethasone (which can exacerbation of pre-existing MASH). Flexible sigmoidoscopy on 12/17/2021 showed CMV colitis and was treated with  ganciclovir followed by letemovir due to bone marrow suppression. 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 Her ascites fluid studies was notable for high SAAG >1.1 and low protein <1.5 suggestive of portal hypertension (mild elevated HVPG of 8 mmHg [WHVP 16 mmHg - FHVP 8 mmHg]on 12/11/21). She has no history of SBP. Her imaging with CTAP on 12/14/21 showed marked steatosis along with US  doppler on 12/11/21 showing steatosis with portal vein diameter of 15 mm. US  abdomen on 12/30/21 showed steatosis with normal surface contour. US  with doppler on 05/16/22 showed mild liver surface nodularity compatible with early cirrhosis with sequela of portal hypertension including moderate ascites and splenomegaly.  She was discharged to a SNF for an extended period of time.    She has been off tube feeds since 06/2022 and has been at home since 09/2022, no longer needing home services. Her diarrhea has completely resolved. Her ascites and edema have resolved. She stopped rifaximin and ursodiol earlier in April.     Interval History: She was last seen in hepatology clinic in 08/2023 since then she has been doing well; remains on treatment for her MM. Liver tests are normal.        Past Medical History:   Diagnosis Date    Acid reflux disease     Anxiety     Depression     Hereditary angioedema (CMS code)     Multiple myeloma, without mention of having achieved remission     IgG kappa    Myeloma (CMS code) 05/10/2011       Past Surgical History:   Procedure Laterality Date  AUTOLOGOUS STEM CELL TRANSPLANTATION  05/12/11    BREAST CYST EXCISION  1981       Current Outpatient Medications   Medication Sig Dispense Refill    acyclovir  (ZOVIRAX ) 400 mg tablet Take 1 tablet (400 mg total) by mouth in the morning and 1 tablet (400  mg total) in the evening. 180 tablet 3    apixaban  (ELIQUIS ) 5 mg tablet Take 1 tablet (5 mg total) by mouth 2 (two) times daily      desvenlafaxine  succinate (PRISTIQ ) 25 mg 24 hr tablet TAKE 1 TABLET BY MOUTH EVERY DAY IN THE MORNING 90 tablet 1    dexAMETHasone  (DECADRON ) 4 mg tablet Take 5 tablets (20 mg total) by mouth every 7 (seven) days TAKE 5 TABLETS (20MG ) WEEKLY PRIOR TO iXAZOMIB. DO NOT TAKE FOR WEEK OFF IXAZOMIB 60 tablet 0    filgrastim -sndz (ZARXIO ) 300 mcg/0.5 mL injection syringe Inject 0.5 mL (300 mcg total) under the skin daily as needed (for anc<1) 6 mL 0    ixazomib (NINLARO ) 3 mg CAP Take 3 mg by mouth every 7 (seven) days TAKE EVERY & DAYS D1, D8 and D15 OF A 28 DAY CYCLE 18 capsule 0    ondansetron  (ZOFRAN ) 8 mg tablet Take 1 tablet (8 mg total) by mouth every 8 (eight) hours as needed for Nausea (Patient not taking: Reported on 10/03/2023)      POMALYST  2 mg capsule TAKE 1 CAPSULE BY MOUTH NIGHTLY AT BEDTIME FOR 21 DAYS ON AND 7 DAYS OFF 21 capsule 0    propranoloL  (INDERAL ) 10 mg tablet Take 0.5-1 tablets (5-10 mg total) by mouth 3 (three) times daily as needed (for panic attacks.  Take at 1st sign of attack) 30 tablet 1     No current facility-administered medications for this visit.       Allergies: She is allergic to diphenoxylate -atropine  and caffeine.    Social History:   Social History     Socioeconomic History    Marital status: Married     Spouse name: Not on file    Number of children: Not on file    Years of education: Not on file    Highest education level: Not on file   Occupational History    Not on file   Tobacco Use    Smoking status: Unknown    Smokeless tobacco: Not on file   Substance and Sexual Activity    Alcohol use: Never    Drug use: Never    Sexual activity: Not Currently   Other Topics Concern    Not on file   Social History Narrative    Not on file     Social Drivers of Health     Financial Resource Strain: Low Risk  (02/28/2023)    Overall Financial Resource Strain  (CARDIA)     Difficulty of Paying Living Expenses: Not hard at all   Food Insecurity: No Food Insecurity (02/28/2023)    Hunger Vital Sign     Worried About Running Out of Food in the Last Year: Never true     Ran Out of Food in the Last Year: Never true   Transportation Needs: No Transportation Needs (02/28/2023)    PRAPARE - Therapist, art (Medical): No     Lack of Transportation (Non-Medical): No   Housing Stability: Low Risk  (02/28/2023)    Housing Stability Vital Sign     Unable to Pay for Housing in the Last Year:  No     Number of Places Lived in the Last Year: 1     Unstable Housing in the Last Year: No       Family History: Her family history includes Colon cancer in her father; Diabetes in her mother; Hypertension in her brother and mother.  Family history is otherwise negative or as noted above.     Physical Exam:  Vital Signs: There were no vitals taken for this visit.  Constitutional: Patient appears well-developed and well-nourished. Pleasant and appropriately interactive.   Eyes: Anicteric sclera  Pulmonary/Chest: Effort normal. No respiratory distress. No cough.  Neurological: Alert and oriented to person, place, and time.  Skin: No rash.     Records Review: I have personally reviewed the patient's medical records. These are summarized within the history of present illness.    Labs: I have personally reviewed and interpreted the following laboratory studies:    CIRRHOSIS MANAGEMENT          Latest Ref Rng & Units 08/25/2023    08:32 08/30/2023    08:39 09/14/2023    09:08 09/28/2023    07:51 10/04/2023    08:00 10/25/2023    10:04 11/22/2023    08:27 12/21/2023    08:04   Labs   WBC Count 3.4 - 10.8 x10E3/uL 2.1  3.3  3.3  2.8  3.2  2.8  2.3  2.2    RBC Count 3.77 - 5.28 x10E6/uL 3.83  3.79  3.85  3.99  4.06  3.82  3.91  3.85    Hemoglobin 11.1 - 15.9 g/dL 44.0  10.2  72.5  36.6  11.9  11.4  11.3  11.4    Hematocrit 34.0 - 46.6 % 35.8  34.9  35.9  36.0  36.9  35.2  34.8  35.0     MCV 79 - 97 fL 94  92  93  90  91  92  89  91    MCH 26.6 - 33.0 pg 30.0  29.6  30.1  29.8  29.3  29.8  28.9  29.6    MCHC 31.5 - 35.7 g/dL 44.0  34.7  42.5  95.6  32.2  32.4  32.5  32.6    Platelet Count 150 - 450 x10E3/uL 148  177  145  180  154  174  177  175    Sodium 134 - 144 mmol/L 141  143  142  142  140  141  142  142    Creatinine 0.57 - 1.00 mg/dL 3.87  5.64  3.32  9.51  0.57  0.55  0.55  0.77    Bilirubin, Total 0.0 - 1.2 mg/dL 0.7  0.7  0.9  0.7  0.9  0.7  0.6  0.7    Albumin , Serum / Plasma 3.9 - 4.9 g/dL 3.8  4.0  3.9  4.2  4.1  3.9  4.1  4.1          01/19/2024    08:05 02/16/2024    08:39   Labs   WBC Count 2.4  2.5    RBC Count 4.02  4.05    Hemoglobin 11.6  11.9    Hematocrit 36.2  36.3    MCV 90  90    MCH 28.9  29.4    MCHC 32.0  32.8    Platelet Count 171  166    Sodium 144  143    Creatinine 0.54  0.53    Bilirubin, Total 0.7  0.8    Albumin , Serum / Plasma 3.8  4.2      .AMB SYNOPSIS NAFLD MANAGEMENT          Latest Ref Rng & Units 08/25/2023    08:32 08/30/2023    08:39 09/14/2023    09:08 09/28/2023    07:51 10/04/2023    08:00 10/25/2023    10:04 11/22/2023    08:27 12/21/2023    08:04   Labs   WBC Count 3.4 - 10.8 x10E3/uL 2.1  3.3  3.3  2.8  3.2  2.8  2.3  2.2    RBC Count 3.77 - 5.28 x10E6/uL 3.83  3.79  3.85  3.99  4.06  3.82  3.91  3.85    Hemoglobin 11.1 - 15.9 g/dL 16.1  09.6  04.5  40.9  11.9  11.4  11.3  11.4    Hematocrit 34.0 - 46.6 % 35.8  34.9  35.9  36.0  36.9  35.2  34.8  35.0    MCV 79 - 97 fL 94  92  93  90  91  92  89  91    MCH 26.6 - 33.0 pg 30.0  29.6  30.1  29.8  29.3  29.8  28.9  29.6    MCHC 31.5 - 35.7 g/dL 81.1  91.4  78.2  95.6  32.2  32.4  32.5  32.6    Platelet Count 150 - 450 x10E3/uL 148  177  145  180  154  174  177  175    Creatinine 0.57 - 1.00 mg/dL 2.13  0.86  5.78  4.69  0.57  0.55  0.55  0.77    AST 0 - 40 IU/L 20  21  18  19  13  18  18  15     ALT 0 - 32 IU/L 19  18  19  18  21  20  15  13     Bilirubin, Total 0.0 - 1.2 mg/dL 0.7  0.7  0.9  0.7  0.9  0.7   0.6  0.7    Alkaline Phosphatase 44 - 121 IU/L 115  116  115  133  95  135  127  107    Albumin , Serum / Plasma 3.9 - 4.9 g/dL 3.8  4.0  3.9  4.2  4.1  3.9  4.1  4.1          01/19/2024    08:05 02/16/2024    08:39   Labs   WBC Count 2.4  2.5    RBC Count 4.02  4.05    Hemoglobin 11.6  11.9    Hematocrit 36.2  36.3    MCV 90  90    MCH 28.9  29.4    MCHC 32.0  32.8    Platelet Count 171  166    Creatinine 0.54  0.53    AST 17  18    ALT 13  13    Bilirubin, Total 0.7  0.8    Alkaline Phosphatase 105  109    Albumin , Serum / Plasma 3.8  4.2      Serologies:  Hep A IgG reactive (12/11/2021)  HBsAg negative (12/11/2021)  Anti-HBs positive (12/11/2021)  Anti-HBc negative (12/11/2021)  HIV negative (12/15/2021)  HCV Ab negative (02/12/2011), negative (12/11/2021)    Studies: I have reviewed the following studies:    PET CT 01/18/2023: Impression:  -Stable appearance of mixed lytic and sclerotic lesions within the thoracolumbar spine and  pelvis. No evidence for interval hypermetabolic skeletal pathology  -Stable nodular thyroid with interval decrease in metabolic activity  -Inflammatory changes within the lower lobes of both lungs     05/16/2022: IMPRESSION:   1.  Mild liver surface nodularity compatible with early cirrhosis and/or fibrosis. Patent vasculature with normal direction of flow.  2.  Sequela of portal hypertension including increased now moderate ascites and splenomegaly.  3.  Cholelithiasis.    Liver biopsy 12/11/2021: FINAL PATHOLOGIC DIAGNOSIS   Liver, transjugular biopsy:  1.  Steatohepatitis with pericentral/sinusoidal and periportal fibrosis; see comment.  2.  Ductular reaction with focal pericholangitis; see comment.  COMMENT:  The biopsy shows severe steatosis with pericentral/sinusoidal and  periportal fibrosis, histiocytic and neutrophilic lobular inflammation,  Mallory hyaline as well as rare ballooned hepatocytes. 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  Clinical  correlation with steatohepatitis risk factors is suggested,  including for any history of massive weight loss which may lead to an  aggressive onset of steatohepatitis [1].  The history of dexamethasone   use is noted; corticosteroid use has been associated with exacerbation  of pre-existing fatty liver disease [2].     Additionally, there is ductular reaction with focal pericholangitis,  raising consideration for a duct obstructive process. Clinical  correlation is advised. The concern for other drug-induced liver injury  is noted. In particular, thalidomide derivatives (such as pomalidomide )  have been reported in association with vanishing bile duct syndrome  (VBDS) [3]; however, there is no convincing duct loss to suggest VBDS.  There are no clusters of plasma cells seen to suggest involvement by  myeloma.    Microscopic findings  Adequacy: 9 portal tracts.   Portal tracts: Mild portal inflammation with histiocytes and lymphocytes, no interface activity, mild ductular reaction with focal pericholangitis. Interlobular bile ducts show mild duct injury without  overt duct loss.   Hepatic parenchyma: Mild lobular inflammation with histiocytes and neutrophils, rare spotty hepatocellular injury/necrosis, severe steatosis with neutrophilic satellitosis (grade 3, scale 0-3), rare  hepatocellular ballooning, extensive Mallory hyaline deposition, rare cholestasis, no granulomas, no viropathic changes, and no sinusoidal changes.    Immunohistochemical and histochemical stains (block A1):  - Trichrome:      Highlights mild to moderate pericellular/sinusoidal fibrosis and periportal fibrosis (stage 2, scale 0-4,NASH-CRN method).  - Iron :  1+ focal staining in scattered Kupffer cells.  - PASD:  No cytoplasmic globules typical of alpha-1-antitrypsin deficiency.  - CK8/18: Highlights Mallory hyaline within hepatocytes.  - CK7: Highlights mild ductular reaction and intact interlobular bile ducts. No periportal hepatocyte staining  to support chronic cholestatic effect.  - CD68: Highlights scattered portal and lobular macrophages. No staining in hepatocytes with Mallory hyaline (indicates that these cells are not macrophages).  - [place] Red:     Negative, no support for hepatic amyloidosis.     ASSESSMENT AND PLAN:  ANISE HARBIN is a 63 y.o. 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    Chronic liver disease: Stage 2 fibrosis with Steatohepatitis and Ductular reaction with focal pericholangitis on liver biopsy in 11/2021.  Her liver enzymes have normalized and ascites resolved with improvement in nutrition/albumin. Agree with holding the rifaximin and the ursodiol.     Multiple myeloma: Planning to start Ixazomib, Cytoxan, Medrol. Reviewed in [facility] liver tox:  Ixazomib: In large clinical trials of ixazomib combined with lenalidomide and dexamethasone, elevations in serum aminotransferase levels were common, occurring in ~10% of patients. However, values greater than 5 times the upper limit of normal (ULN) were rare, occurring in <1% of recipients. Cases of clinically apparent liver injury including acute liver failure have been reported in patients receiving ixazomib, however in many instances multiple concomitant medications were being taken and the specific role of ixazomib in causing the liver injury was not always clear. The clinical features of cases of liver injury attributed to ixazomib have not been defined in any detail. Hepatotoxicity is listed as a warning in the product label  for ixazomib and monitoring of serum enzymes during treatment is recommended.  Cyclophosphamide: Mild and transient elevations in serum aminotransferase levels are found in up to 43% of patients with cancer who are treated with cyclophosphamide. The abnormalities are generally asymptomatic and transient and do not require dose modification. Enzyme elevations are more common with higher doses and with intravenous therapy. In some instances, marked elevations arise warranting dose modification or discontinuation of cyclophosphamide. Clinically apparent liver injury from standard doses of cyclophosphamide is uncommon, but several case reports of acute liver injury with jaundice have been published. The onset is within 2 to 8 weeks of starting cyclophosphamide and the pattern of serum enzyme elevations is hepatocellular. Immunoallergic and autoimmune features are uncommon. The injury in most cases is self-limited and resolves within 1 to 3 months of stopping; however, fatal instances have been reported. Recurrence on reexposure has been described.  Medrol: Corticosteroid therapy can cause hepatic steatosis and hepatic enlargement, but this is often not clinically apparent, particularly in adults. This effect can occur quite rapidly and is rapidly reversed with discontinuation. High doses and long term use has been associated with the development or exacerbation of nonalcoholic steatohepatitis with elevations in serum aminotransferase levels and liver histology resembling alcoholic hepatitis with steatosis, chronic inflammation, centrolobular ballooning degeneration and Mallory bodies. However, symptomatic or progressive liver injury from corticosteroid induced steatohepatitis is uncommon. Furthermore, corticosteroids may act to worsen an underlying nonalcoholic fatty liver disease rather than causing the condition de novo. The worsening may be due to direct effects of glucocorticoids on insulin resistance or fatty acid  metabolism or may be the result of weight gain which is common with long term corticosteroid therapy. While simple steatosis induced by corticosteroids is rapidly reversible, steatohepatitis can be slow to resolve upon withdrawal of corticosteroids. She has now started treatment and reassuringly liver tests remain normal    Surveillance for hepatocellular carcinoma: Liver biopsy in 11/2021 showed F2 fibrosis, but imaging in July 2023 concerning for advanced fibrosis (complicated by multifactorial ascites). Discussed with patient pros/cons of monitoring for [facility]- decided to proceed with surveillance. Will re-send U/S order.    Immunity: She is hepatitis A and B immune. Anti-HBc negative. Anti-HBs titer was low, so I recommend repeat and HBV booster if Ab <12    Follow-up: 6 months       I spent a total of 40 minutes on this patient's care on the day of their visit excluding time spent related to any billed procedures. This time includes time spent with the patient as well as time spent documenting in the medical record, reviewing patient's records  and tests, obtaining history, placing orders, communicating with other healthcare professionals, counseling the patient, family, or caregiver, and/or care coordination for the diagnoses above.

## 2024-02-28 ENCOUNTER — Telehealth: Admit: 2024-02-28 | Discharge: 2024-02-28 | Payer: PRIVATE HEALTH INSURANCE | Attending: Physician

## 2024-02-28 DIAGNOSIS — C9000 Multiple myeloma not having achieved remission: Secondary | ICD-10-CM

## 2024-02-28 NOTE — Progress Notes (Signed)
 Ariel Braun  [id number]    Date of Service: 02/28/2024    Reason for visit / Chief Complaints:    Subjective   Ariel Braun is a 63 y.o. female with MM who is on VIDEO follow-up    I performed this consultation using real-time Telehealth tools, including a live video connection between my location and the patient's location. Prior to initiating the consultation, I obtained informed verbal consent to perform this consultation using Telehealth tools and answered all the questions about the Telehealth interaction. Patient in [place] .    Events since the last visit: The patient has been ON ixazomib (3mg )/Pom (2 mg) and dex (12mg ) therapy. Has had some family stressors and has been experiencing "panic" attacks. She is working with psychiatry to help control these symptoms and has a new med. Also decreased dex to 12 mg. Has had some swelling in the ankles.  No infectious symptoms.  No "hot spots" or new bone pain. No significant signs of progressive MM. No kidney/urinary difficuties. No f/chills, URI symptoms, sinus congestion, sore throat or other infectious symptoms. Has been sheltering. No N/V/dysuria/diarrhea.  No CP, palp.  No bleeding or bruising.    Oncology history:  DX:   IgG kappa myeloma, diagnosed 09/07/10   - BMBx: 8% plasma cells, normal cyto 46XX, FISH showed only 3 copies 1q21, Stage 2 ISS (3.88 beta 2 microglobulin),IgG 4750, M-spike 3.2, kappa 586.9 mg/L.     TREATMENT  1. LINE1: RVD 10/2010 through April 2011 (? 4 cycles). Achieved VGPR with M-spike 0.4, IgG 886, kappa 27.6.   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 M-spike 0.6, IgG 1270.     3. LINE2: 08/05/14: Started Rev 15 mg 3/4. M-spike 1, IgG 1640, kappa 62.2 - SD    Biochemical PD 04/17/19: M-1.4, KLC 96.3. ? PD. Up Rev to 15 mg qd (3/4) and dex 8 mg wkly(4/4).       07/20/19: L hip pain. M-1.2, KLC 96. Cont. Rev 15 mg (3/4)/dex 8 mg (4/4).     LINE4: Pom 3, Dara, Dex 8 (3/4).       Severe infection-sepsis 12/22 - all meds stopped. Admit [place] 11/2021-01/2022  - CMV colitis, PE, Cholecystitis.  - Hospitalized in [facility] thru 6/2  - Hospitalized [facility] 6/14 - 7/13 - colitis/Noro    LINE5: Ixa/Pom/dex Cycle #1 07/2023       Past Medical History:   Diagnosis Date    Acid reflux disease     Anxiety     Depression     Hereditary angioedema (CMS code)     Multiple myeloma, without mention of having achieved remission     IgG kappa    Myeloma (CMS code) 05/10/2011     Past medical, family and social histories, as well as medications and allergies, were reviewed and updated in the medical record as appropriate.    Current Outpatient Medications   Medication Sig Dispense Refill    acyclovir (ZOVIRAX) 400 mg tablet Take 1 tablet (400 mg total) by mouth in the morning and 1 tablet (400 mg total) in the evening. 180 tablet 3    apixaban (ELIQUIS) 5 mg tablet Take 1 tablet (5 mg total) by mouth 2 (two) times daily      desvenlafaxine succinate (PRISTIQ) 25 mg 24 hr tablet TAKE 1 TABLET BY MOUTH EVERY DAY IN THE MORNING 90 tablet 1    dexAMETHasone (DECADRON)  4 mg tablet Take 5 tablets (20 mg total) by mouth every 7 (seven) days TAKE 5 TABLETS (20MG ) WEEKLY PRIOR TO iXAZOMIB. DO NOT TAKE FOR WEEK OFF IXAZOMIB 60 tablet 0    filgrastim-sndz (ZARXIO) 300 mcg/0.5 mL injection syringe Inject 0.5 mL (300 mcg total) under the skin daily as needed (for anc<1) 6 mL 0    ixazomib (NINLARO) 3 mg CAP Take 3 mg by mouth every 7 (seven) days TAKE EVERY & DAYS D1, D8 and D15 OF A 28 DAY CYCLE 18 capsule 0    ondansetron (ZOFRAN) 8 mg tablet Take 1 tablet (8 mg total) by mouth every 8 (eight) hours as needed for Nausea (Patient not taking: Reported on 10/03/2023)      POMALYST 2 mg capsule TAKE 1 CAPSULE BY MOUTH NIGHTLY AT BEDTIME FOR 21 DAYS ON AND 7 DAYS OFF 21 capsule 0    propranoloL (INDERAL) 10 mg tablet Take 0.5-1 tablets (5-10 mg total) by mouth 3 (three) times daily as needed (for panic attacks.  Take at 1st  sign of attack) 30 tablet 1     No current facility-administered medications for this visit.       ALLERGIES  Allergies/Contraindications   Allergen Reactions    Diphenoxylate-Atropine Abdominal Pain     Severe stomach pain     Caffeine Anxiety     "Jitters"       PHYSICAL EXAM- LIMIT EXAM BY VIDEO  Objective: ECOG1  Constitutional:  Pleasant over video  HENT:  Normocephalic, Neck- Supple.   SKIN: No rash noted Eyes:  PERRL, EOMI, sclera appear clear  Neurologic:  Alert & interactive, nl affect, No focal deficits noted.       RESULTS: Labs in [facility] were reviewed. Lab results reviewed and interpretation provided to patient  Lab Results   Component Value Date    WBC Count 2.5 (LL) 02/16/2024    Hemoglobin 11.9 02/16/2024    Hematocrit 36.3 02/16/2024    MCV 90 02/16/2024    Platelet Count 166 02/16/2024     Lab Results   Component Value Date    Creatinine 0.53 (L) 02/16/2024    Creatinine (POCT) 0.8 06/05/2014         Outside Labs:    No results found.  ASSESSMENT & PLAN  1. Multiple Myeloma: Impression: established - stable - on therapy - requiring disease monitoring  This patient is on therapy and at high-risk for side effects and progression and requiring intensive monitoring. The patient's therapy is Ixaz/Pom/dex. She has tolerated well.      We reviewed the labs: SIFE, SPEP, SFLC, CBC => KLC 68.8, LLC 9.5, m-prot 0.6 - these numbers are decreasing. No lab signs of medications.    We discussed that she can decrease the dex to 2 tablets (8 mg) weekly. Otherwise continue treatment    The patient has a high complexity problem: multiple myeloma, which is an incurable malignancy and the patient is receiving active therapy, which have side effects and poses a threat to life and bodily function.    Regarding data complexity, I have reviewed the following tests => SPEP, SIFE, SFLCs, CBC and CMP.      Medical Decision Making/ Plan: - Continue on IxaPd therapy - changes as above. No new therapy needed  - Continue to follow  myeloma protein levels every 4-6 weeks  - Follow-up in 4 months     2. Immunocompromised state/Infectious Disease Concerns: Impression: established - immunocompromised from disease/treatment:  The patient is  immunocompromised and is at high-risk from infection due to therapy. Needs to be up-to-date on Covid vaccine. Has been avoiding exposure. No recent exposures.     Requiring close follow-up for symptoms.  At risk for VZV reactivation and is on ACV. No current signs of infection. Will call with new signs of infection        Plan: - Continue prophylaxis  - yearly flu vaccine  - Avoid Covid exposure  - The patient will call with any symptoms or signs of infection    3. Depression/Psychologic/Social Stressors: Impression: established - stable  The patient has cancer and has situation depression and is at risk for anxiety. Having some anxiety and will decrease dex. mNeeds close follow-up    Currently, the attitude is good. There are no signs of anxiety or active depression. Mood appropriate, understands plan      Plan: No additional medications needed    4. Anemia: Impression: established - stable    The blood counts are good, follow. Next CBC in 2-4 weeks locally      Plan: No transfusions or cytokines needed today.    5. Neutropenia/Thrombocytopenia: Impression: established - stable    The treatment has toxicity on the bone marrow - no current signs of suppression    Plan: No cytokines or transfusions    6. RI/ Electrolyte Replacement: Impression: established - stable     Taking ample fluids and avoiding nephrotoxins.  Lytes & Cr Okay, appears euvolemic. Appetite good and weight unchanged. No new medications needed.     Plan: Continue adequate fluid intake & balanced, nutritional diet   - Continue as much exercise as possible.  - No additional supplementation needed.    7. Osteopenia: Impression: established - stable:    The patient is at risk for fracture due to MM. We recommend Calcium and Vit D replacement. We  also recommend an aggressive exercise program.    Plan: - No Biphosphonates    8. Secondary Cancer Risk: impression: established - NEEDS close follow-up  The patient has high-risk of secondary malignancies. Needs close follow-up and routine cancer screening.    Plan: - Derm follow-up at least yearly recommended  - Colonoscopy every 5 years  - Screening per PCP.

## 2024-02-28 NOTE — Patient Instructions (Addendum)
 Can decrease the dexamethasone  to 2 tablets (8 mg) weekly  Continue Ixazomib and Pom.   Follow-up in 4 months with Gaylyn Keas

## 2024-03-01 MED ORDER — PROPRANOLOL 10 MG TABLET
10 | ORAL_TABLET | Freq: Three times a day (TID) | ORAL | 1 refills | Status: AC | PRN
Start: 2024-03-01 — End: 2025-03-01

## 2024-03-07 MED ORDER — POMALYST 2 MG CAPSULE
2 | ORAL_CAPSULE | Freq: Every day | ORAL | 0 refills | Status: DC
Start: 2024-03-07 — End: 2024-04-07

## 2024-03-14 ENCOUNTER — Telehealth: Admit: 2024-03-14 | Discharge: 2024-03-14 | Payer: PRIVATE HEALTH INSURANCE

## 2024-03-14 DIAGNOSIS — F419 Anxiety disorder, unspecified: Secondary | ICD-10-CM

## 2024-03-14 NOTE — Progress Notes (Signed)
 Subjective    Ariel Braun is a 63 y.o. female with multiple myeloma (diagnosis category: Multiple myeloma (including amyloidosis)  metastatic) who is being seen via video visit while patient is at home at the request of Dr. Francesco Inks of (referral category: Oncology/Rad Onc/Onc Surgery) for symptom management.    Medical team: PCP Dr. Carlis Cherry, oncologist Dr. Samuel Crock.  Hepatologist Dr. Alberta Almond.    Primary caregiver(s):  Her daughter  Palliative care team member disciplines: Physician    HPI  Ariel Braun is a 63 y.o. female with anxiety and mental stressors.    Today patient/family reports:     Anxiety:  - Did not have panic/anxiety attacks for 2.5 weeks but had an episode after that, states that the episode was not as intense as her previous episodes and notices that it is becoming less intense but still prevents her from functioning. States that she notices that she experiences her eyes turning red, with sensations in her mouth, lightheadedness with sweating and paralysis during her panic attack episodes but states that the episodes are not lasting as long as prior episodes and feels tired after each episode. Tries to manage it without additional medications.   - She has been trying to monitor the triggers and feels that it is related to stress as she started having panic attacks due to mental stressors after her daughter's birthday. Tries to practice "mind over matter" to cope up. Tends to find techniques such as breathing to relax and monitor her triggers, finds it helpful and useful for her.  - She is stressed about her daughter's health and notes she has seen about 15 doctors and no one can figure out what is going on.   She is on an extremely restrictive diet consisting of protein and vegetables without carbohydrates. She is also concerned about the health of her husband and son and is managing her family situation.  - On Pristiq 25 mg PO qAM with noticeable benefit. Says it was life  changing for her in ways that are hard to describe but she is less anxious and more verbal.  Inquires why Pristiq is not helpful for panic attacks?  - Has followed up with Dr. Gaylyn Keas and her dexamethasone dose was reduced from 20 mg weekly to 12 mg weekly and she states that she is able to feel the difference after doing so.  Told she can go to 8mg  per week but worried how this reduction may impact her cancer.    - Has not tried propranolol but plans on trying it. Could not find bottle so now has a 2nd one.   -did not pursue [place] psychologist yet    Social:  - Has recently had her daughter's birthday celebration and enjoyed it.    Social history most significant for:  Lives with husband, mom and son, who has intellectual disability.  Isolated.  Cares for son along with help from daughter, whom she is close to.    Daughter also lives with her.    She reports she is in a caregiver role in her family.      Spiritual:  - Born and raised Catholic but tends to gain more spiritual information and guidance from principles that are Christian-based and Bible-based and applies those principles and traditions in real life.  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    - She and her daughter watch a lot of Lynder Sanger programs that are uplifting and feels that it provides positive direction and  helps her focus on good things in her life. Prays and reads the Bible and states that this helps.    Fam hx: Strong fam hx of addiction.  Father was alcoholic.  Mom used valium to cope.  Brothers with drug and alcohol addiction.      Patient/Family screened for spiritual care needs: Yes  Patient/Family screened for psychosocial needs: Yes    SMS Well-Being Survey      06/26/2023     9:37 PM 08/03/2023     9:49 AM 01/23/2024     4:14 PM   SMS Well-Being Scores   Pain  8 0   Shortness of breath  0 0   Constipation 8 8 3    Tiredness  5 7   Nausea  0 0   Depression  3 0   Anxiety 7 7 8    Drowsiness  7    Appetite 0 9 0   Feeling of wellbeing 5 7  3    Other problem Sometimes feel shakiness (inside feeling not trimmers)  At times feel like it's too much med.     "I feel at peace." A moderate amount A moderate amount A moderate amount   "At times I worry I will be a burden to my family." A moderate amount A moderate amount Not at all   How would you rate your overall quality of life? Precilla Broaden   DPOA I have an Advanced Health Care Directive Health Care Directive               Objective    Physical Exam:   NAD  Sitting on couch  Normal neck ROM  Normal resp effort    Performance status (by Palliative Performance Scale): 70% - Reduced ambulation, Unable Normal Job/Work, Significant disease, Occasional assistance necessary, Normal or reduced intake, Full conscious level      Review of Prior Testing  I have personally reviewed and interpreted the following studies:  Lab Results   Component Value Date    WBC Count 2.5 (LL) 02/16/2024    Hemoglobin 11.9 02/16/2024    Hematocrit 36.3 02/16/2024    MCV 90 02/16/2024    Platelet Count 166 02/16/2024     Lab Results   Component Value Date    Sodium, Serum / Plasma 143 02/16/2024    Potassium, Serum / Plasma 3.5 02/16/2024    Urea Nitrogen, serum/plasma 17 02/16/2024    Creatinine 0.53 (L) 02/16/2024     Lab Results   Component Value Date    Thyroid Stimulating Hormone 3.98 12/15/2021     No results found.    #  Cancer stage at diagnosis- see onc note for staging.      Assessment and Plan    Ariel Braun is a 63 y.o. 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      Anxiety: Did not have panic/anxiety attacks for 2.5 weeks but had an episode after that, states that the episode was not as intense as her previous episodes and notices that it is becoming less intense but still prevents her from functioning. States that she notices that she experiences her eyes turning red, with  sensations in her mouth, lightheadedness with sweating and paralysis during her panic attack episodes but states that the episodes are not lasting as long as prior episodes and feels tired after each episode.   -  suspect due, in part to Dex 20mg  once a week, this has been tapered to 12mg  a week and she was told she could go to 8mg  a week if needed.  She wants to know if there is any adverse effect of tapering Dex on her cancer and advised she message her oncologist about this.  Writer did ask for a decrease of Dex but only if it was safe to do so with respect to her cancer treatment. She has also been under a lot of stress with her health and family's health issues.    - Supportive listening and counseling provided.  - On Pristiq 25 mg PO qAM with noticeable benefit.  Cont.  She does not want to increase.    - Can take 5mg  of Propanolol tid prn panic attacks. Can take 10mg  if 5mg  ineffective. Suspect BP and HR elevated during episode and the dose is low so doubt it will cause any hypotension.  She says she will try it.    - Discussed about mindfulness-based stress reduction program that focuses on different stress-reduction techniques that also involve medication and breathing techniques which can help stay more in the present and worry less about the future. Writer will reach out regarding information about the program. Also recommended following up with a [place] based therapist and reminded her they could probably see her over video.   Previously discussed:  -have spoken to Samuel Crock and myeloma team plans to dose reduce Dex slightly if safe to do so. Pauleen Borne that Dex can worsen anxiety. We discussed increasing Pristiq but would like to dose reduce Dex 1st if possible.  -she declines benzos (has tried a few in the past) due to addiction risk  -list of Christian therapists provided local to her in [place] from [employer] website. Advised she could likely see them via video.  -she asks Clinical research associate for more freq  follow up and finds the visits helpful.     From prior visits:    Cancer-directed treatment: Currently undergoing cancer treatment with IPd therapy. Finds that the symptoms are manageable.  - Her WBC tends to decline towards the end of her cancer treatment cycle and uses Zarxio and Nivestym injection q14h. Was previously visiting [place] for receiving injections q7d.  Previously discussed:  - Currently on Ninalro/Pom/Dex. Discussed that steroids like dexamethasone can increase her anxiet.      Advance Care Planning   - Prognostic awareness: N/A  - Relevant values/priorities: Reduction in anxiety, care for her son.    - Surrogate decision maker: Not addressed  - Resuscitation preferences: Unknown  -  AD: Deferred  - POLST: Deferred    - GOC convo deferred to future visit  - Today I spent 0 minutes with the patient specifically discussing ACP.     Time Spent  I spent a total of 40 minutes on this patient's care on the day of their visit excluding time spent related to any billed procedures. This time includes time spent with the patient as well as time spent documenting in the medical record, reviewing patient's records and tests, obtaining history, placing orders, communicating with other healthcare professionals, counseling the patient, family, or caregiver, and/or care coordination for the diagnoses above.    I performed this evaluation using real-time telehealth tools, including a live video Zoom connection between my location and the patient's location. Prior to initiating, the patient consented to perform this evaluation using telehealth tools.        Thank you for  allowing us  to participate in the care of your patient. Please feel free to reach out with any questions, concerns or ideas about additional ways we could serve your patient & their family.    Follow-up: May 01, 2024 at 11:15 AM.    Lacretia Piccolo, MD    I, Velva Gibbons am acting as a scribe for services provided by Lacretia Piccolo, MD on 03/14/24, 1:20 PM.     The above scribed documentation accurately reflects the services I have provided.    Lacretia Piccolo, MD   03/14/2024 3:20 PM

## 2024-03-16 LAB — COMPREHENSIVE METABOLIC PANEL (BMP, AST, ALT, T.BILI, ALKP, TP ALB)
AST: 24 IU/L (ref 15–59)
Alanine transaminase: 17 IU/L (ref 0–35)
Albumin, Serum / Plasma: 4.1 g/dL (ref 3.9–4.9)
Alkaline Phosphatase: 100 IU/L (ref 44–121)
BUN/Creatinine Ratio (External Lab): 24 (ref 12–28)
Bilirubin, Total: 1 mg/dL (ref 0.0–1.2)
Calcium, total, Serum / Plasma: 9.4 mg/dL (ref 8.7–10.3)
Carbon Dioxide, Total: 23 mmol/L (ref 20–29)
Chloride, Serum / Plasma: 109 mmol/L — ABNORMAL HIGH (ref 96–106)
Creatinine: 0.55 mg/dL — ABNORMAL LOW (ref 0.57–1.00)
Globulin, Total: 2.2 g/dL (ref 1.5–4.5)
Glucose, non-fasting: 82 mg/dL (ref 70–99)
Potassium, Serum / Plasma: 4.1 mmol/L (ref 3.5–5.2)
Protein, Total, Serum / Plasma: 6.3 g/dL (ref 6.0–8.5)
Sodium, Serum / Plasma: 143 mmol/L (ref 134–144)
Urea Nitrogen, serum/plasma: 13 mg/dL (ref 8–27)
eGFRcr: 102 mL/min/{1.73_m2} (ref >59)

## 2024-03-16 LAB — COMPLETE BLOOD COUNT WITH DIFFERENTIAL
% Basophils: 2 %
% Eosinophils: 1 %
% Lymphocytes: 48 %
% Monocytes: 16 %
% Neutrophils: 33 %
Abs Basophils: 0 10*3/uL (ref 0.0–0.2)
Abs Eosinophils: 0 10*3/uL (ref 0.0–0.4)
Abs Lymphocytes: 1.2 10*3/uL (ref 0.7–3.1)
Abs Monocytes: 0.4 10*3/uL (ref 0.1–0.9)
Abs Neutrophils: 0.8 10*3/uL — ABNORMAL LOW (ref 1.4–7.0)
Hematocrit: 37.9 % (ref 34.0–46.6)
Hemoglobin: 12.3 g/dL (ref 11.1–15.9)
MCH: 28.9 pg (ref 26.6–33.0)
MCHC: 32.5 g/dL (ref 31.5–35.7)
MCV: 89 fL (ref 79–97)
Platelet Count: 174 10*3/uL (ref 150–450)
RBC Count: 4.25 x10E6/uL (ref 3.77–5.28)
RDW-CV: 16.7 % — ABNORMAL HIGH (ref 11.7–15.4)
WBC Count: 2.5 10*3/uL — CL (ref 3.4–10.8)

## 2024-03-16 NOTE — Telephone Encounter (Addendum)
 Called pt x 2 to relay the below. Left detailed msg and will FUP with MyChart message   Asked pt to reply to [facility] to confirm she is aware of plan for Zarxio    ----- Message from Samuel Crock sent at 03/16/2024 10:26 AM PDT -----  Gable Johann or covering PN,    Please ask patient to self inject zarxio.  If she prefers to go to [facility] for zarxio, please help arrange.    Thanks,  Haskell Linker  ----- Message -----  From: Felicie Horning, [facility]  Sent: 03/15/2024   1:12 PM PDT  To: Lan Pin, NP

## 2024-03-22 IMAGING — MR RM PELVE COM CONTRASTE (não inclui articulações coxofemorais)
15 of 21 series · 25 of 48 positions shown · non-contrast
Comparison: none

[Series 2: DWI · axial · 5.0mm · 1.37mm/px · z∈[-43,+131]mm · 3 of 60 slices shown (1 of 2)]
[im 1/60]
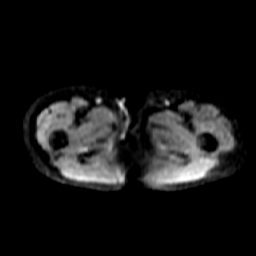
[im 30/60]
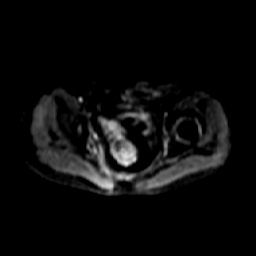
[im 60/60]
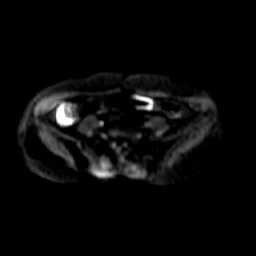

[Series 3: T2 · axial · 5.0mm · 0.68mm/px · 1 of 30 slices shown (1 of 4)]
[im 1/30]
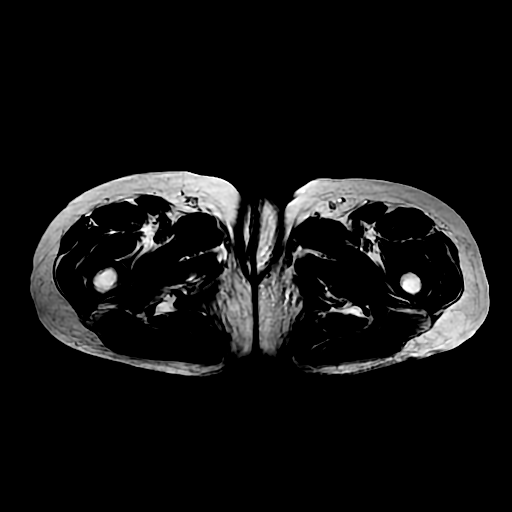

[Series 4: DWI · axial · 5.0mm · 1.37mm/px · z∈[-55,+143]mm · 2 of 64 slices shown (2 of 2)]
[im 1/64]
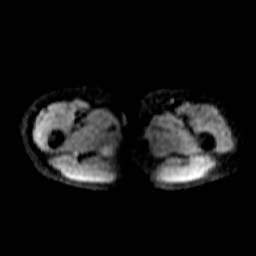
[im 64/64]
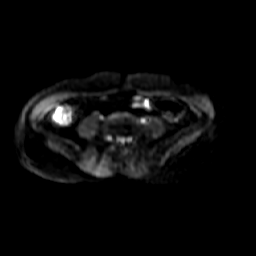

[Series 5: T2 · axial · 5.0mm · 0.68mm/px · 1 of 34 slices shown (2 of 4)]
[im 1/34]
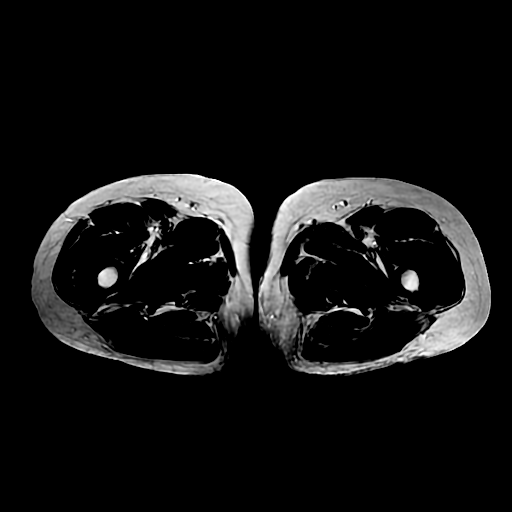

[Series 6: T2 · coronal · 5.0mm · 0.74mm/px · 1 of 30 slices shown (3 of 4)]
[im 1/30]
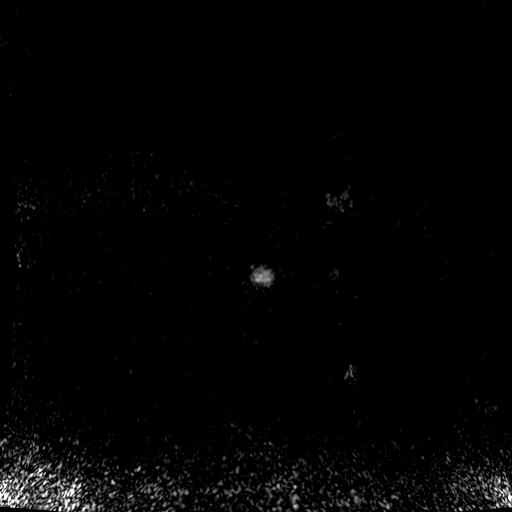

[Series 7: T2 · sagittal · 4.0mm · 0.55mm/px · 1 of 24 slices shown (4 of 4)]
[im 1/24]
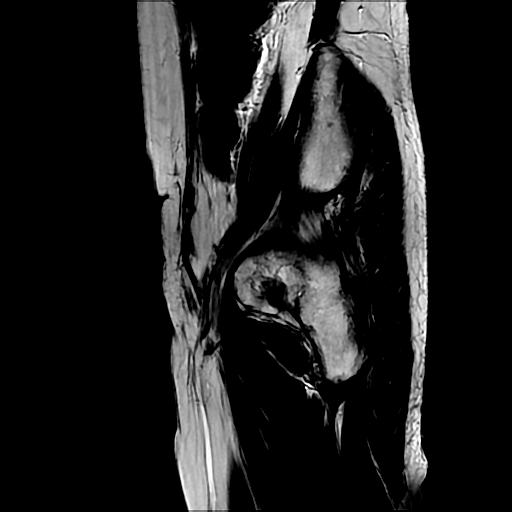

[Series 8: FLAIR · axial · 5.0mm · 0.68mm/px · 1 of 34 slices shown]
[im 1/34]
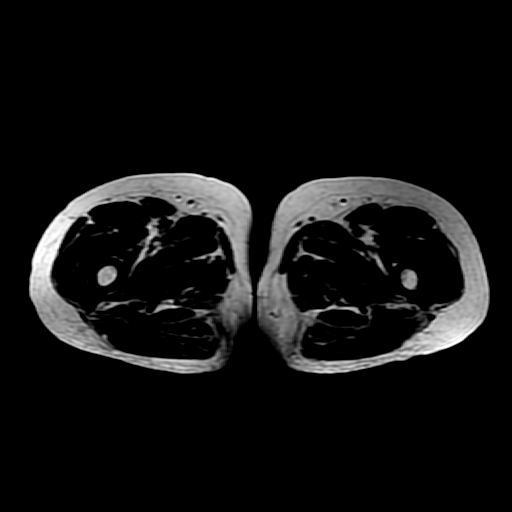

[Series 9: ax dual echo · axial · 5.0mm · 0.68mm/px · z∈[-55,+143]mm · 2 of 68 slices shown]
[im 1/68]
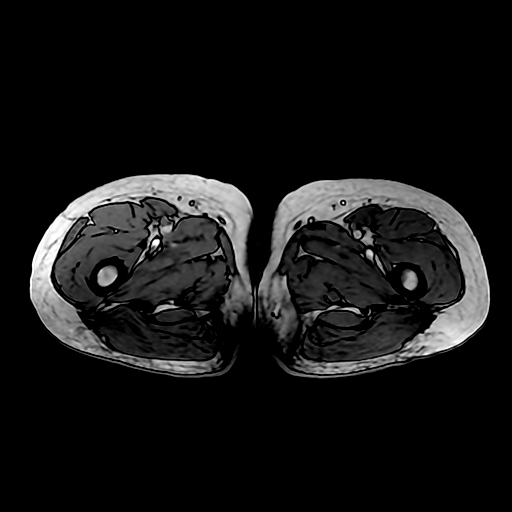
[im 68/68]
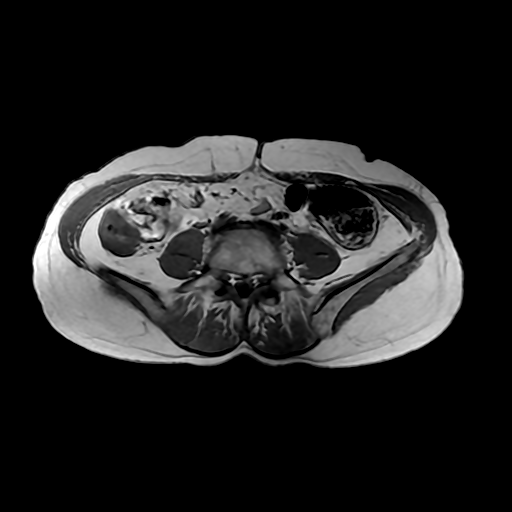

[Series 11: T1 dynamic · sagittal · 4.7mm · 0.59mm/px · 2 of 70 slices shown (1 of 3)]
[im 1/70]
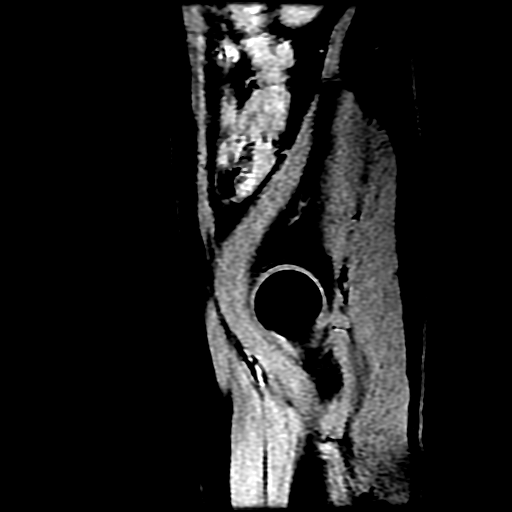
[im 70/70]
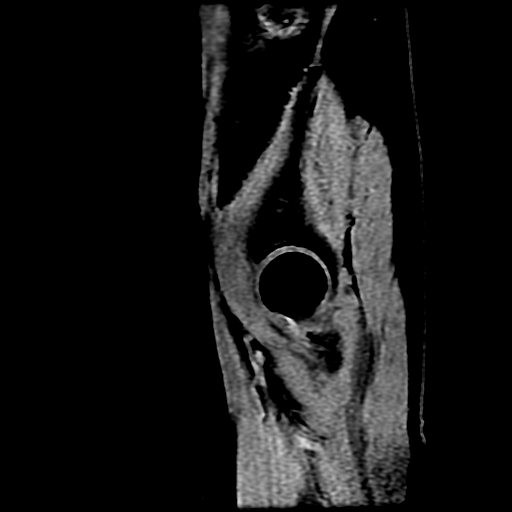

[Series 250: ADC · axial · 5.0mm · 1.37mm/px · 1 of 30 slices shown (1 of 2)]
[im 1/30]
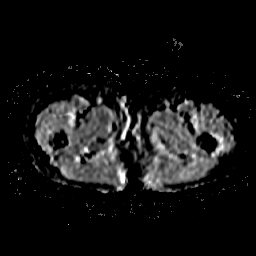

[Series 251: eadc · axial · 5.0mm · 1.37mm/px · 1 of 30 slices shown (1 of 2)]
[im 1/30]
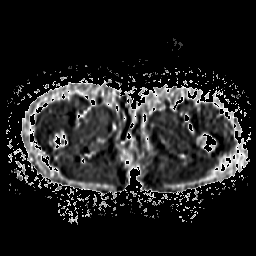

[Series 450: ADC · axial · 5.0mm · 1.37mm/px · 1 of 34 slices shown (2 of 2)]
[im 1/34]
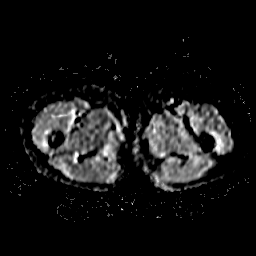

[Series 451: eadc · axial · 5.0mm · 1.37mm/px · 1 of 34 slices shown (2 of 2)]
[im 1/34]
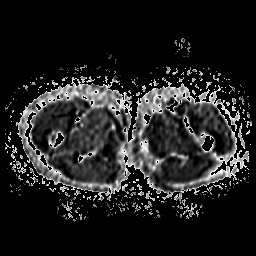

[Series 1001: T1 dynamic · axial · 3.9mm · 0.61mm/px · z∈[-73,+145]mm · 4 of 110 slices shown (2 of 3)]
[im 1/110]
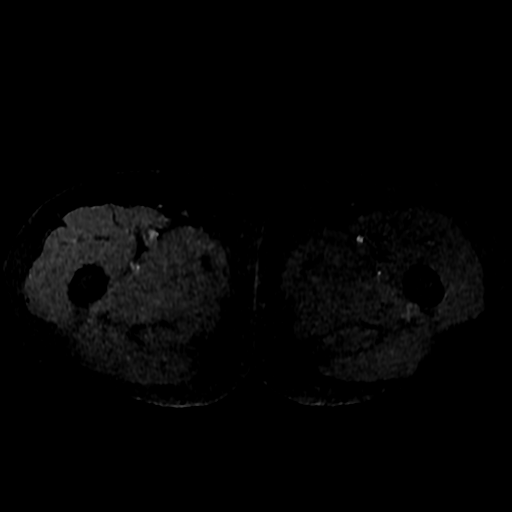
[im 37/110]
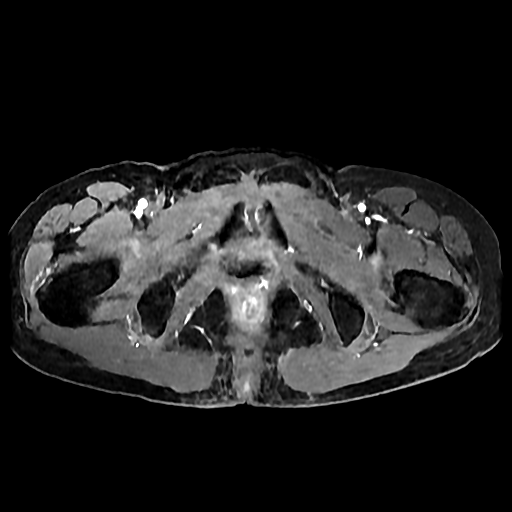
[im 73/110]
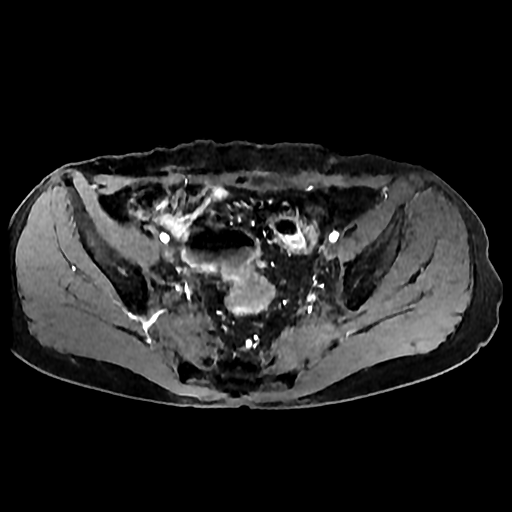
[im 110/110]
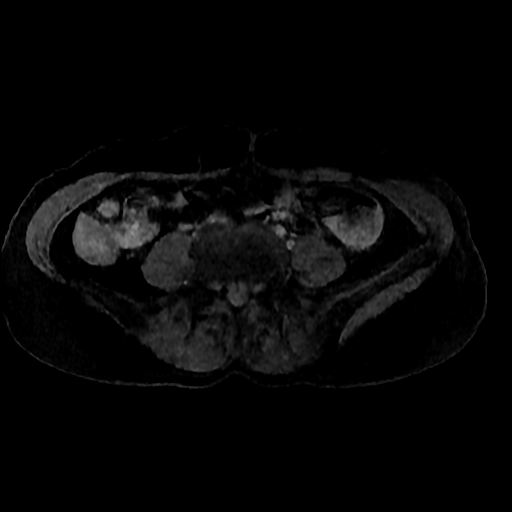

[Series 1002: T1 dynamic · axial · 3.9mm · 0.61mm/px · z∈[-73,+71]mm · 3 of 110 slices shown (3 of 3)]
[im 1/110]
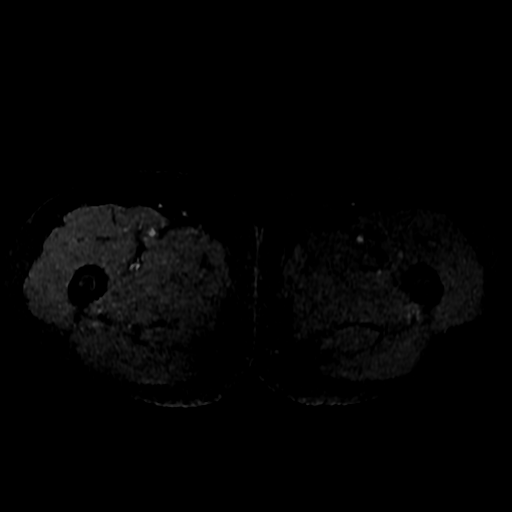
[im 37/110]
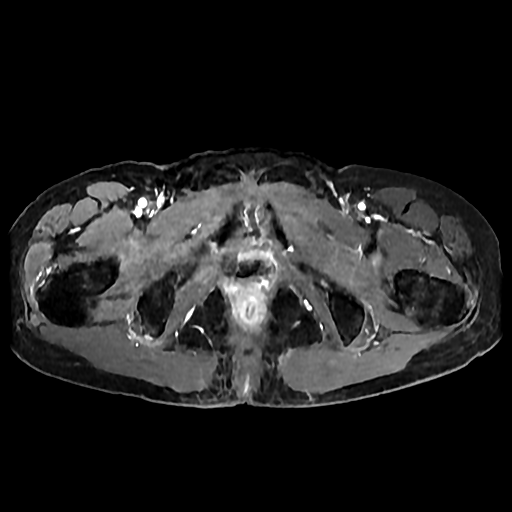
[im 73/110]
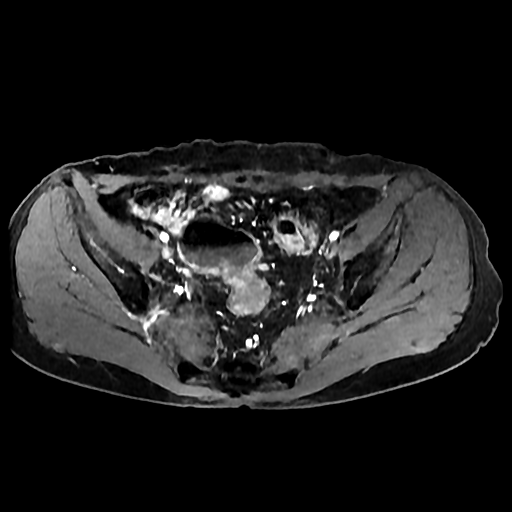

[25 of 48 positions shown; findings below may reference images not displayed]

RESSONÂNCIA MAGNÉTICA DE PELVE FEMININA

TÉCNICA:
Exame realizado em equipamento de ressonância magnética com sequências, ponderações e planos específicos para o segmento de interesse, antes e após a administração endovenosa do meio de contraste.

RESULTADO:
O estudo atual e a comparação com exame prévio do dia 03 11 24 permite as seguintes considerações:

Ausência de líquido livre na pelve.
Histerectomia.
Cistectomia.
Permanecem as alterações no assoalho pélvico provocadas pelo tratamento radioterápico provocando um sinal hipointenso em T2 em várias estruturas com retração do cólon e do reto.
Permanecem junto aos vasos ilíacos as imagens de aspecto císticos que poderiam estar relacionadas aos folículos ovarianos.
Não há sinal de lesão expansiva no interior da pelve. 
Na sequência ponderada em difusão não se identificou áreas de restrição compatíveis com recidiva tumoral.

CONCLUSÃO: 
Ressonância magnética de pelve realizada para controle de tratamento.

## 2024-03-26 MED ORDER — NINLARO 3 MG CAPSULE
3 | ORAL_CAPSULE | ORAL | 0 refills | 28.00000 days | Status: DC
Start: 2024-03-26 — End: 2024-07-02

## 2024-03-26 MED ORDER — DEXAMETHASONE 4 MG TABLET
4 | ORAL_TABLET | ORAL | 0 refills | 14.00000 days | Status: DC
Start: 2024-03-26 — End: 2024-08-28

## 2024-03-28 ENCOUNTER — Telehealth: Admit: 2024-03-28 | Discharge: 2024-03-28 | Payer: PRIVATE HEALTH INSURANCE | Attending: Nurse Practitioner

## 2024-03-28 DIAGNOSIS — C9000 Multiple myeloma not having achieved remission: Secondary | ICD-10-CM

## 2024-03-28 MED ORDER — ZARXIO 300 MCG/0.5 ML INJECTION SYRINGE
300 | Freq: Every day | INTRAMUSCULAR | 1 refills | 24.50000 days | Status: AC | PRN
Start: 2024-03-28 — End: ?

## 2024-03-28 NOTE — Patient Instructions (Signed)
 Continue Izazomib, Pom, and dex at current doses no change.  Dex remains at 12mg  for now.    Monthly labs locally    Follow up with Dr. Gaylyn Keas on 3 months

## 2024-03-28 NOTE — Progress Notes (Signed)
 Ariel Braun  [id number]    Date of Service: 03/28/2024    Reason for visit / Chief Complaints:    Subjective   Ariel Braun is a 63 y.o. female with MM who is on VIDEO follow-up    I performed this consultation using real-time Telehealth tools, including a live video connection between my location and the patient's location. Prior to initiating the consultation, I obtained informed verbal consent to perform this consultation using Telehealth tools and answered all the questions about the Telehealth interaction. Patient in [place] .    Events since the last visit: The patient has been ON ixazomib (3mg )/Pom (2 mg) and dex (12mg ) therapy. Has had some family stressors and has been experiencing "panic" attacks about every 2 weeks. Overall, panic attacks not as bad but can sometimes be crippling. She finds it helpful to step out of a stressful situation, even if for a short period.  She is working with psychiatry to help control these symptoms and has been on pristiq. Since decreasing dex to 12 mg about 2 months ago, feels less "hype", but no major improvement in panic attacks. Has had some swelling in the ankles.  No infectious symptoms.  No "hot spots" or new bone pain. No significant signs of progressive MM. No kidney/urinary difficuties. No f/chills, URI symptoms, sinus congestion, sore throat or other infectious symptoms. Has been sheltering. No N/V/dysuria/diarrhea.  No CP, palp.  No bleeding or bruising.    Ariel Braun is the primary caregiver for her family:  Son, Ariel Braun, has a brain tumor.  Husband, Ariel Braun, is a diabetic  Daughter, Ariel Braun, health issues, unclear diagnosis.    Oncology history:  DX:   IgG kappa myeloma, diagnosed 09/07/10   - BMBx: 8% plasma cells, normal cyto 46XX, FISH showed only 3 copies 1q21, Stage 2 ISS (3.88 beta 2 microglobulin),IgG 4750, M-spike 3.2, kappa 586.9 mg/L.     TREATMENT  1. LINE1: RVD 10/2010 through April 2011 (? 4 cycles). Achieved VGPR with M-spike 0.4, IgG 886, kappa  27.6.   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 M-spike 0.6, IgG 1270.     3. LINE2: 08/05/14: Started Rev 15 mg 3/4. M-spike 1, IgG 1640, kappa 62.2 - SD    Biochemical PD 04/17/19: M-1.4, KLC 96.3. ? PD. Up Rev to 15 mg qd (3/4) and dex 8 mg wkly(4/4).       07/20/19: L hip pain. M-1.2, KLC 96. Cont. Rev 15 mg (3/4)/dex 8 mg (4/4).     LINE4: Pom 3, Dara, Dex 8 (3/4).      Severe infection-sepsis 12/22 - all meds stopped. Admit [facility] 11/2021-01/2022  - CMV colitis, PE, Cholecystitis.  - Hospitalized in [facility] thru 6/2  - Hospitalized [facility] 6/14 - 7/13 - colitis/N[place]    LINE5: Ixa/Pom/dex Cycle #1 07/2023       Past Medical History:   Diagnosis Date    Acid reflux disease     Anxiety     Depression     Hereditary angioedema (CMS code)     Multiple myeloma, without mention of having achieved remission     IgG kappa    Myeloma (CMS code) 05/10/2011     Past medical, family and social histories, as well as medications and allergies, were reviewed and updated in the medical record as appropriate.    Current Outpatient Medications   Medication Sig Dispense Refill    acyclovir (  ZOVIRAX) 400 mg tablet Take 1 tablet (400 mg total) by mouth in the morning and 1 tablet (400 mg total) in the evening. 180 tablet 3    apixaban (ELIQUIS) 5 mg tablet Take 1 tablet (5 mg total) by mouth 2 (two) times daily      desvenlafaxine succinate (PRISTIQ) 25 mg 24 hr tablet TAKE 1 TABLET BY MOUTH EVERY DAY IN THE MORNING 90 tablet 1    dexAMETHasone (DECADRON) 4 mg tablet Take 5 tablets (20 mg total) by mouth every 7 (seven) days TAKE 5 TABLETS (20MG ) WEEKLY PRIOR TO iXAZOMIB. DO NOT TAKE FOR WEEK OFF IXAZOMIB 45 tablet 0    filgrastim-sndz (ZARXIO) 300 mcg/0.5 mL injection syringe Inject 0.5 mL (300 mcg total) under the skin daily as needed (for anc<1) 6 mL 0    ixazomib (NINLARO) 3 mg CAP Take 3 mg by mouth every 7 (seven) days TAKE EVERY & DAYS D1, D8 and D15 OF A 28 DAY  CYCLE 27 capsule 0    ondansetron (ZOFRAN) 8 mg tablet Take 1 tablet (8 mg total) by mouth every 8 (eight) hours as needed for Nausea (Patient not taking: Reported on 10/03/2023)      pomalidomide (POMALYST) 2 mg capsule Take 1 capsule (2 mg total) by mouth nightly at bedtime TAKE ONE CAPSULE BY MOUTH FOR 21 DAYS FOLLOWED BY 7 DAYS OFF 21 capsule 0    propranoloL (INDERAL) 10 mg tablet TAKE 0.5-1 TABLETS (5-10 MG TOTAL) BY MOUTH 3 (THREE) TIMES DAILY AS NEEDED (FOR PANIC ATTACKS. TAKE AT 1ST SIGN OF ATTACK) 90 tablet 1     No current facility-administered medications for this visit.       ALLERGIES  Allergies/Contraindications   Allergen Reactions    Diphenoxylate-Atropine Abdominal Pain     Severe stomach pain     Caffeine Anxiety     "Jitters"       PHYSICAL EXAM- LIMIT EXAM BY VIDEO  Objective: ECOG1  Constitutional:  Pleasant over video  HENT:  Normocephalic, Neck- Supple.   SKIN: No rash noted Eyes:  EOMI, sclera appear clear  Neurologic:  Alert & interactive, nl affect, No focal deficits noted.       RESULTS: Labs in [facility] were reviewed. Lab results reviewed and interpretation provided to patient  Lab Results   Component Value Date    WBC Count 2.5 (LL) 03/15/2024    Hemoglobin 12.3 03/15/2024    Hematocrit 37.9 03/15/2024    MCV 89 03/15/2024    Platelet Count 174 03/15/2024     Lab Results   Component Value Date    Creatinine 0.55 (L) 03/15/2024    Creatinine (POCT) 0.8 06/05/2014         Outside Labs:    No results found.  ASSESSMENT & PLAN  1. Multiple Myeloma: Impression: established - stable - on therapy - requiring disease monitoring  This patient is on therapy and at high-risk for side effects and progression and requiring intensive monitoring. The patient's therapy is Ixaz/Pom/dex. She has tolerated well.      We reviewed the labs: SIFE, SPEP, SFLC, CBC => KLC 64.4, LLC 8.7, m-prot 0.6 - these numbers nearly unchanged from prior. Patient is concerned # plateaued after decrease of dex from 20mg  to 12mg .   No lab signs of progression.    Continue current treatment and at current dosages, no change for now.    The patient has a high complexity problem: multiple myeloma, which is an incurable malignancy and the  patient is receiving active therapy, which have side effects and poses a threat to life and bodily function.    Regarding data complexity, I have reviewed the following tests => SPEP, SIFE, SFLCs, CBC and CMP.      Medical Decision Making/ Plan: - Continue on IxaPd therapy - no changes at this time.  - Continue to follow myeloma protein levels every 4 weeks  - Follow-up in 4 months     2. Immunocompromised state/Infectious Disease Concerns: Impression: established - immunocompromised from disease/treatment:  The patient is immunocompromised and is at high-risk from infection due to therapy. Needs to be up-to-date on Covid vaccine. Has been avoiding exposure. No recent exposures.     Requiring close follow-up for symptoms.  At risk for VZV reactivation and is on ACV. No current signs of infection. Will call with new signs of infection        Plan: - Continue prophylaxis  - yearly flu vaccine  - Avoid Covid exposure  - The patient will call with any symptoms or signs of infection    3. Depression/Psychologic/Social Stressors: Impression: established - stable  The patient has cancer and has situation depression and is at risk for anxiety. Having some anxiety and will decrease dex. mNeeds close follow-up    Currently, the attitude is good. There are no signs of anxiety or active depression. Mood appropriate, understands plan      Plan: No additional medications needed    4. Anemia: Impression: established - stable    The blood counts are good, follow. Next CBC in 2-4 weeks locally      Plan: No transfusions or cytokines needed today.    5. Neutropenia/Thrombocytopenia: Impression: established - stable    The treatment has toxicity on the bone marrow - no current signs of suppression    Plan: No cytokines or  transfusions    6. RI/ Electrolyte Replacement: Impression: established - stable     Taking ample fluids and avoiding nephrotoxins.  Lytes & Cr Okay, appears euvolemic. Appetite good and weight unchanged. No new medications needed.     Plan: Continue adequate fluid intake & balanced, nutritional diet   - Continue as much exercise as possible.  - No additional supplementation needed.    7. Osteopenia: Impression: established - stable:    The patient is at risk for fracture due to MM. We recommend Calcium and Vit D replacement. We also recommend an aggressive exercise program.    Plan: - No Biphosphonates    8. Secondary Cancer Risk: impression: established - NEEDS close follow-up  The patient has high-risk of secondary malignancies. Needs close follow-up and routine cancer screening.    Plan: - Derm follow-up at least yearly recommended  - Colonoscopy every 5 years  - Screening per PCP.        Elvie Hammed NP  Hematology, Blood & Marrow Transplant, and Cellular Therapy  [place] [facility]  office [phone]  fax [phone]  03/29/24       Patient Care Team:  Thersia Flax, DO as PCP - General (Family Medicine)  Angela Kell, MD (Hematology and Oncology)  Generic Provider Mychart  Lacretia Piccolo, MD (Palliative Care Medicine)  Daisey Dryer III, MD (Hematology)  Lan Pin, NP as Nurse Practitioner (Family Medicine)  Marsha Skeen, MD (Transplant Hepatology)      APP Visit Information:   APP Service Type:  Independent  Available MD consultant:  Daisey Dryer  III, MD       Complex Problem: Incurable malignancy, requires close monitoring for signs and symptoms of disease progression  Risk of complications, morbidity/mortality of patient management: high; the patient's systemic cancer therapy requires regular and intensive monitoring for potential major/life-threatening toxicities  Data review/interpretation: I reviewed and/or ordered 3+  tests and/or documents    Patient continues on treatment for myeloma,

## 2024-04-07 MED ORDER — POMALYST 2 MG CAPSULE
2 | ORAL_CAPSULE | ORAL | 0 refills | Status: DC
Start: 2024-04-07 — End: 2024-05-09

## 2024-04-12 LAB — COMPREHENSIVE METABOLIC PANEL (BMP, AST, ALT, T.BILI, ALKP, TP ALB)
AST: 21 IU/L (ref 15–59)
Alanine transaminase: 15 IU/L (ref 0–35)
Albumin, Serum / Plasma: 4 g/dL (ref 3.9–4.9)
Alkaline Phosphatase: 99 IU/L (ref 44–121)
BUN/Creatinine Ratio (External Lab): 27 (ref 12–28)
Bilirubin, Total: 0.8 mg/dL (ref 0.0–1.2)
Calcium, total, Serum / Plasma: 9.4 mg/dL (ref 8.7–10.3)
Carbon Dioxide, Total: 25 mmol/L (ref 20–29)
Chloride, Serum / Plasma: 106 mmol/L (ref 96–106)
Creatinine: 0.55 mg/dL — ABNORMAL LOW (ref 0.57–1.00)
Globulin, Total: 2.3 g/dL (ref 1.5–4.5)
Glucose, non-fasting: 85 mg/dL (ref 70–99)
Potassium, Serum / Plasma: 3.9 mmol/L (ref 3.5–5.2)
Protein, Total, Serum / Plasma: 6.3 g/dL (ref 6.0–8.5)
Sodium, Serum / Plasma: 143 mmol/L (ref 134–144)
Urea Nitrogen, serum/plasma: 15 mg/dL (ref 8–27)
eGFRcr: 102 mL/min/{1.73_m2} (ref >59)

## 2024-04-12 LAB — COMPLETE BLOOD COUNT WITH DIFFERENTIAL
% Basophils: 2 %
% Eosinophils: 2 %
% Lymphocytes: 43 %
% Monocytes: 16 %
% Neutrophils: 37 %
Abs Basophils: 0 10*3/uL (ref 0.0–0.2)
Abs Eosinophils: 0.1 10*3/uL (ref 0.0–0.4)
Abs Lymphocytes: 1 10*3/uL (ref 0.7–3.1)
Abs Monocytes: 0.4 10*3/uL (ref 0.1–0.9)
Abs Neutrophils: 0.9 10*3/uL — ABNORMAL LOW (ref 1.4–7.0)
Hematocrit: 37.4 % (ref 34.0–46.6)
Hemoglobin: 11.9 g/dL (ref 11.1–15.9)
MCH: 28.4 pg (ref 26.6–33.0)
MCHC: 31.8 g/dL (ref 31.5–35.7)
MCV: 89 fL (ref 79–97)
Platelet Count: 195 10*3/uL (ref 150–450)
RBC Count: 4.19 x10E6/uL (ref 3.77–5.28)
RDW-CV: 17.3 % — ABNORMAL HIGH (ref 11.7–15.4)
WBC Count: 2.4 10*3/uL — CL (ref 3.4–10.8)

## 2024-04-19 LAB — PROTEIN ELECTROPHORESIS, SERUM
A/G Ratio: 1.2 (ref 0.7–1.7)
Albumin, sPEP: 3.4 g/dL (ref 2.9–4.4)
Alpha 1 Globulin: 0.3 g/dL (ref 0.0–0.4)
Alpha 2 Globulin: 0.7 g/dL (ref 0.4–1.0)
Beta Globulin: 1.1 g/dL (ref 0.7–1.3)
Gamma Globulin: 0.7 g/dL (ref 0.4–1.8)
Globulin, Total: 2.9 g/dL (ref 2.2–3.9)
M-Protein (monoclonal), Serum: 0.6 g/dL — ABNORMAL HIGH
Protein, Total, Serum / Plasma: 6.3 g/dL (ref 6.0–8.5)

## 2024-04-19 LAB — IMMUNOFIXATION ELECTROPHORESIS, SERUM
IgA, Serum: 69 mg/dL — ABNORMAL LOW (ref 87–352)
IgG: 888 mg/dL (ref 586–1602)
IgM, Serum: 30 mg/dL (ref 26–217)

## 2024-04-19 LAB — CYTOMEGALOVIRUS DNA, QUANTITATIVE PCR, PLASMA: Cytomegalovirus DNA, Quantitative PCR: NEGATIVE [IU]/mL

## 2024-04-19 LAB — KAPPA AND LAMBDA FREE LIGHT CHAINS, SERUM
Kappa Light Chain, Serum, Free: 63.8 mg/L — ABNORMAL HIGH (ref 3.3–19.4)
Kappa/Lambda Ratio, Serum, Free: 6.72 — ABNORMAL HIGH (ref 0.26–1.65)
Lambda Light Chain, Serum, Free: 9.5 mg/L (ref 5.7–26.3)

## 2024-04-23 MED ORDER — ACYCLOVIR 400 MG TABLET
400 | ORAL_TABLET | Freq: Two times a day (BID) | ORAL | 3 refills | 30.00000 days | Status: AC
Start: 2024-04-23 — End: ?

## 2024-05-01 ENCOUNTER — Telehealth: Admit: 2024-05-01 | Discharge: 2024-05-01 | Payer: PRIVATE HEALTH INSURANCE

## 2024-05-01 DIAGNOSIS — F410 Panic disorder [episodic paroxysmal anxiety] without agoraphobia: Secondary | ICD-10-CM

## 2024-05-01 IMAGING — MR JOELHO DIREITO
6 series · 40 of 40 positions shown · non-contrast
Comparison: none

[Series 3: axial dp dixon_f · axial · right · 4.0mm · 0.78mm/px · z∈[-73,+52]mm · 8 of 25 slices shown]
[im 1/25]
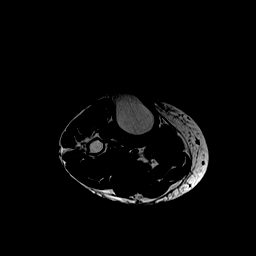
[im 4/25]
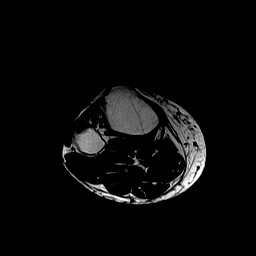
[im 7/25]
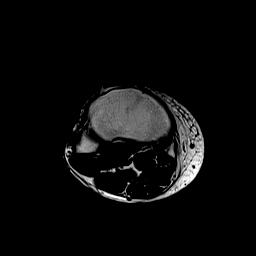
[im 11/25]
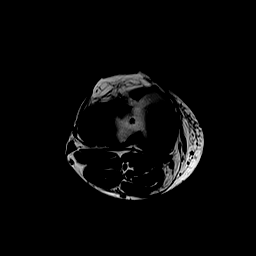
[im 14/25]
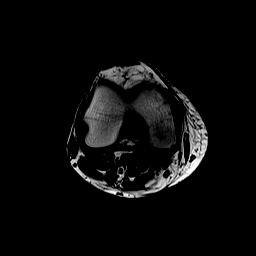
[im 18/25]
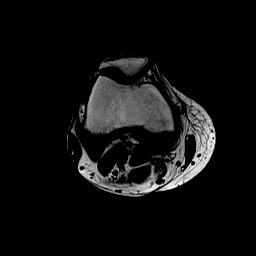
[im 21/25]
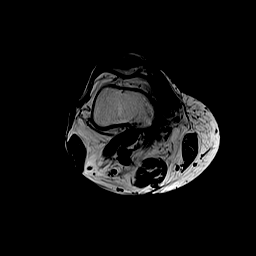
[im 25/25]
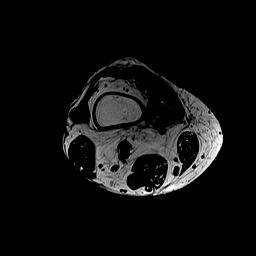

[Series 4: axial dp dixon_w · axial · right · 4.0mm · 0.78mm/px · z∈[-73,+52]mm · 8 of 25 slices shown]
[im 1/25]
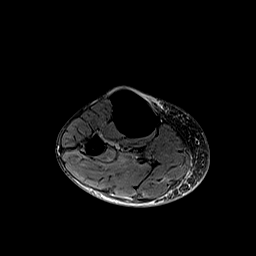
[im 4/25]
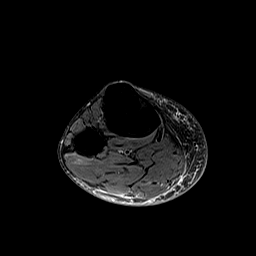
[im 7/25]
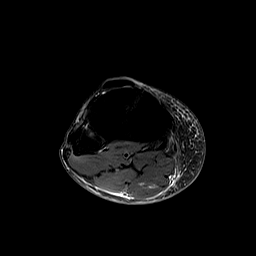
[im 11/25]
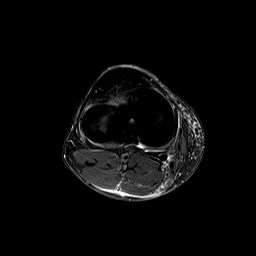
[im 14/25]
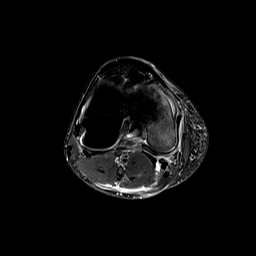
[im 18/25]
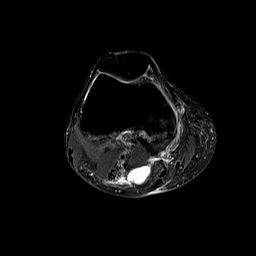
[im 21/25]
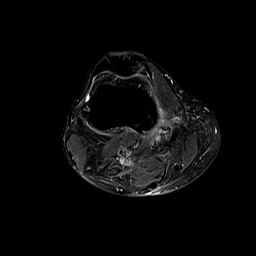
[im 25/25]
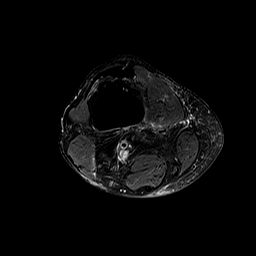

[Series 5: coronal dp fs · coronal · right · 4.0mm · 0.50mm/px · 7 of 20 slices shown]
[im 1/20]
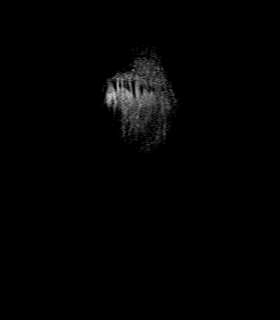
[im 4/20]
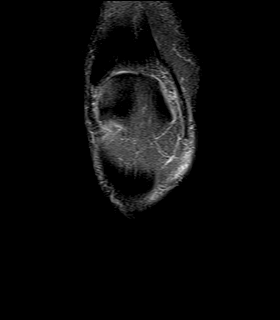
[im 7/20]
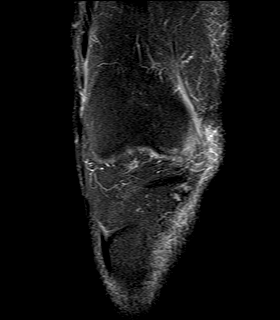
[im 10/20]
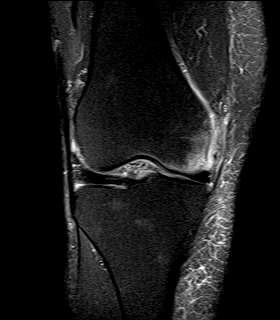
[im 13/20]
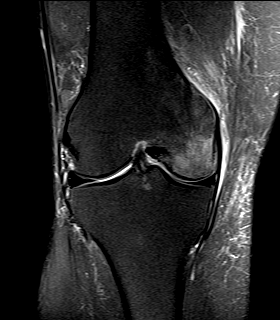
[im 16/20]
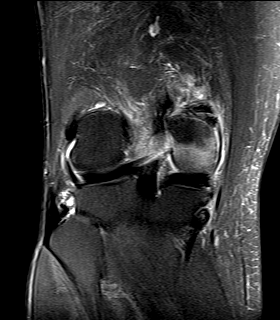
[im 20/20]
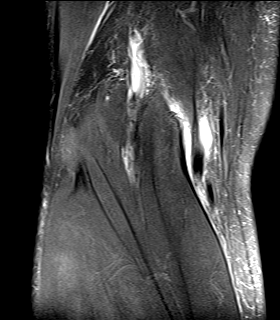

[Series 6: T1 · sagittal · right · 4.0mm · 0.56mm/px · 7 of 20 slices shown]
[im 1/20]
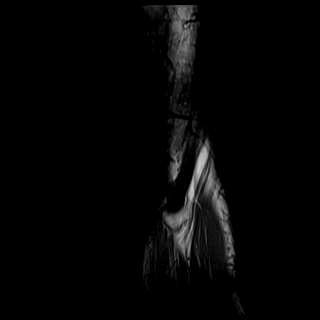
[im 4/20]
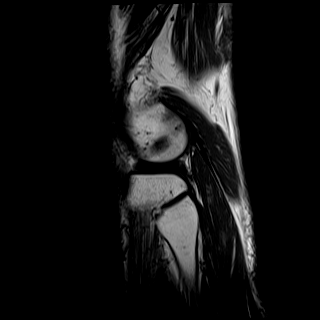
[im 7/20]
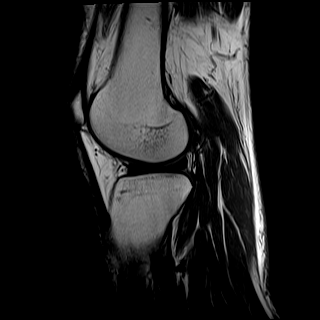
[im 10/20]
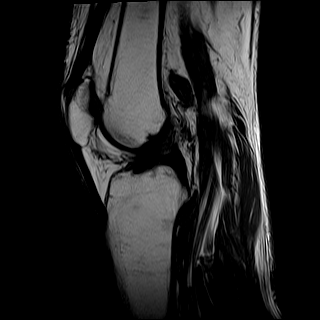
[im 13/20]
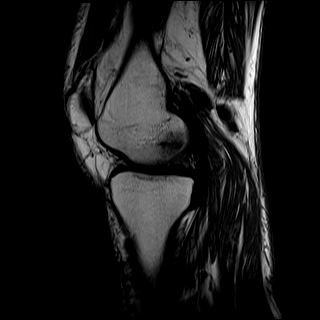
[im 16/20]
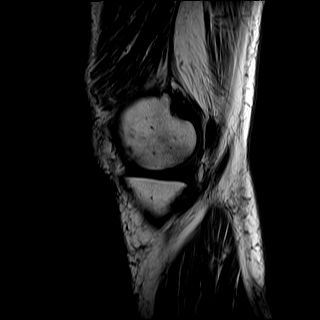
[im 20/20]
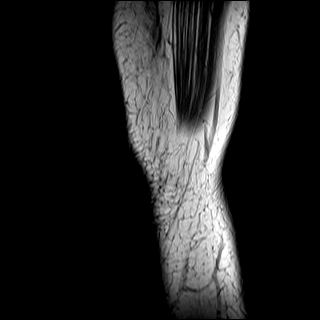

[Series 7: sagital dp fs · sagittal · right · 4.0mm · 0.50mm/px · 7 of 20 slices shown]
[im 1/20]
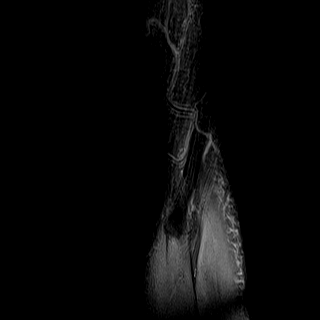
[im 4/20]
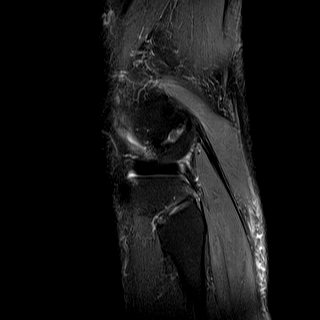
[im 7/20]
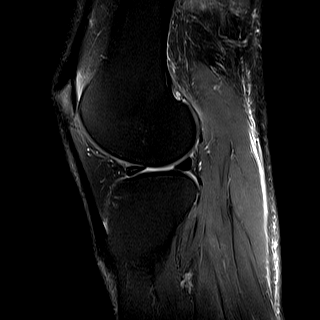
[im 10/20]
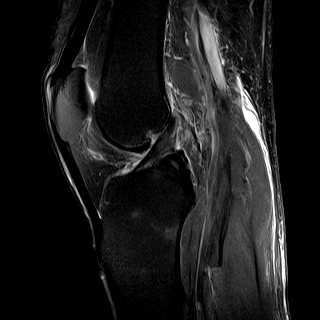
[im 13/20]
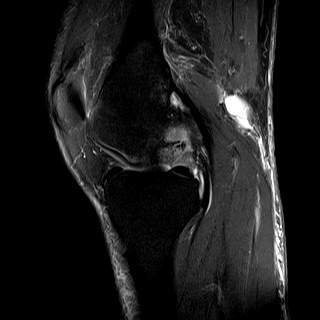
[im 16/20]
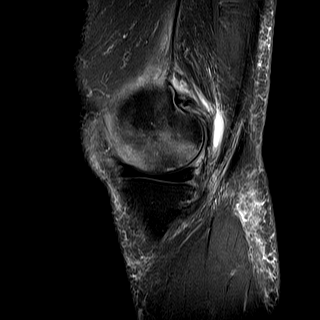
[im 20/20]
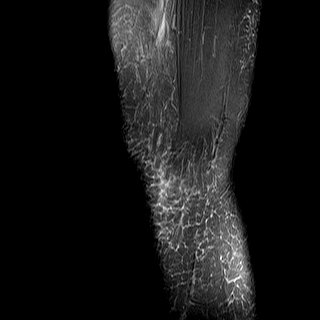

[Series 8: ligamento · oblique · right · 4.0mm · 0.39mm/px · 3 of 10 slices shown]
[im 1/10]
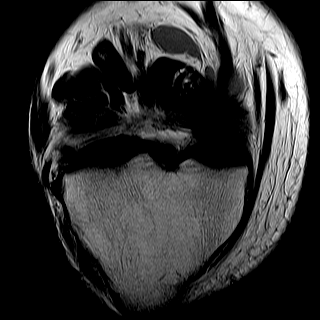
[im 5/10]
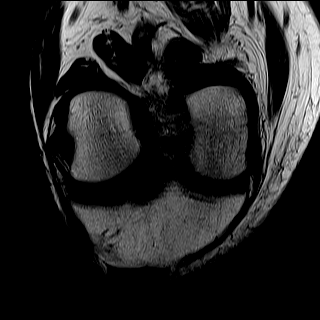
[im 10/10]
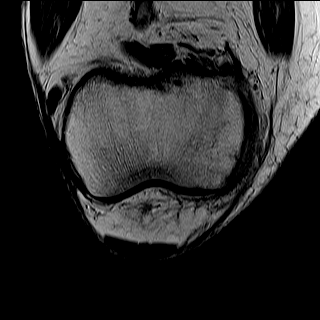

[40 of 40 positions shown; findings below may reference images not displayed]

RESSONÂNCIA MAGNÉTICA DO JOELHO DIREITO

TÉCNICA:

Exame realizado em equipamento de ressonância magnética com sequências, ponderações e planos específicos para o segmento de interesse, sem a administração endovenosa do meio de contraste.

RESULTADO:

Tendões patelar, bíceps femoral distal, poplíteo, trato iliotibial e tendões da pata de ganso sem particularidades.
Edema da gordura superficial pré-patelar e infrapatelar.
Leve derrame articular.
Tendinopatia insercional do tendão quadríceps.
Sinais leves de artrose tricompartimental.
Alterações degenerativas no corno anterior do menisco lateral e no corno posterior do menisco medial.
Edema / microfraturas trabeculares comprometendo o côndilo femoral medial.
Lesão grau I comprometendo o ligamento colateral medial.
Ligamentos cruzados anterior e posterior e colateral lateral anatomicamente normais.
Presença de Gabardo roto dissecando os planos miofasciais.
Edema difuso da tela gordurosa.

CONCLUSÃO:

Edema da gordura superficial pré-patelar e infrapatelar.
Leve derrame articular.
Tendinopatia insercional do tendão quadríceps.
Sinais leves de artrose tricompartimental.
Alterações degenerativas no corno anterior do menisco lateral e no corno posterior do menisco medial.
Edema / microfraturas trabeculares comprometendo o côndilo femoral medial.
Lesão grau I comprometendo o ligamento colateral medial.
Ligamentos cruzados anterior e posterior e colateral lateral anatomicamente normais.
Presença de Gabardo roto dissecando os planos miofasciais.
Edema difuso da tela gordurosa.

## 2024-05-01 NOTE — Progress Notes (Signed)
 Subjective    Ariel Braun is a 63 y.o. female with multiple myeloma (diagnosis category: Multiple myeloma (including amyloidosis)  metastatic) who is being seen via video visit while patient is at home at the request of Dr. Particia Shams of (referral category: Oncology/Rad Onc/Onc Surgery) for symptom management.    Medical team: PCP Dr. Marjorie, oncologist Dr. Inocente Shams.  Hepatologist Dr. Wyvonna Domino.    Primary caregiver(s):  Her daughter  Palliative care team member disciplines: Physician    HPI  Ariel Braun is a 63 y.o. female with anxiety and mental stressors.    Today patient/family reports:     Anxiety:  - Had a mild panic attack on Sunday previously at 4:30 AM and did not last very long. She reports that her last 2 attacks happened in the early morning. Symptoms are becoming less intense as compared to prior attacks. She is monitoring the potential triggers such as dealing with the health of her son, daughter and spouse's health and focusing on what she can do to try to avoid anxiety which she finds helpful. Her daughter is also dealing with job loss due to medical disability. She has significant fatigue, strict diet with protein and green vegetables such as broccoli. Son has parathyroid in his chest which is causing high calcium  and PTH levels. He also has multiple SQ nodules across his body that she says are related to parathyroid dz. He also wears leg braces due to dislocation. She is also concerned about her son climbing stairs due to that. Spouse has type 1 diabetes with elevated blood pressure. Feels blessed to have her son, who also has developmental delay.  She says he is very forthright and uncomplicated in many ways and joyous and this is different than many people.   - Reports increase in anxiety when she is overwhelmed or feels lost but goes outdoors for a change of environment or indulges in other activities such as crochet hats or making bracelets which she finds helpful for it.  Donates some items to [place] children with illness   - She reports a tendency to revisiting her past memories and traumas associated with her childhood and finds self-talk and prayer helpful to progress forward and stay in the moment. Reminds herself of positive things in her life today.  - Reports that she has grown in a pessimistic environment with parental AUD and has followed up with 3-4 therapists in the past.   Did not find very helpful  - She plans on taking propranolol  but is reluctant and cautious about taking it due to side effects. She has not tried it yet.  Does not need 90 day supply    Social:  - Has recently had her daughter's birthday celebration and enjoyed it.    Social history most significant for:  Lives with husband, mom and son, who has intellectual disability but can take care of himself.  Isolated.  Cares for son along with help from daughter, whom she is close to.  Daughter also lives with her.    She reports she is in a caregiver role in her family.      Spiritual:  - Born and raised Catholic but tends to gain more spiritual information and guidance from principles that are Christian-based and Bible-based and applies those principles and traditions in real life.  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    - She and her daughter watch a lot of [place]le[employer] programs that are uplifting and feels that  it provides positive direction and helps her focus on good things in her life. Prays and reads the Bible and states that this helps.    Fam hx: Strong fam hx of addiction.  Father was alcoholic.  Mom used valium to cope.  Brothers with drug and alcohol addiction.      Patient/Family screened for spiritual care needs: Yes  Patient/Family screened for psychosocial needs: Yes  SMS Well-Being Survey      06/26/2023     9:37 PM 08/03/2023     9:49 AM 01/23/2024     4:14 PM   SMS Well-Being Scores   Pain  8 0   Shortness of breath  0 0   Constipation 8 8 3    Tiredness  5 7   Nausea  0 0   Depression  3 0    Anxiety 7 7 8    Drowsiness  7    Appetite 0 9 0   Feeling of wellbeing 5 7 3    Other problem Sometimes feel shakiness (inside feeling not trimmers)  At times feel like it's too much med.     "I feel at peace." A moderate amount A moderate amount A moderate amount   "At times I worry I will be a burden to my family." A moderate amount A moderate amount Not at all   How would you rate your overall quality of life? Metta Metta Metta   DPOA I have an Advanced Health Care Directive Health Care Directive               Objective    Physical Exam:   NAD  Sitting on couch  Normal neck ROM  Normal resp effort    Performance status (by Palliative Performance Scale): 70% - Reduced ambulation, Unable Normal Job/Work, Significant disease, Occasional assistance necessary, Normal or reduced intake, Full conscious level      Review of Prior Testing  I have personally reviewed and interpreted the following studies:  Most recent relevant labs   Component Value Date    WBC Count 2.4 (LL) 04/12/2024    Hemoglobin 11.9 04/12/2024    Hematocrit 37.4 04/12/2024    MCV 89 04/12/2024    Platelet Count 195 04/12/2024     Most recent relevant labs   Component Value Date    Sodium, Serum / Plasma 143 04/12/2024    Potassium, Serum / Plasma 3.9 04/12/2024    Urea Nitrogen, serum/plasma 15 04/12/2024    Creatinine 0.55 (L) 04/12/2024     Most recent relevant labs   Component Value Date    Thyroid Stimulating Hormone 3.98 12/15/2021     No results found.    #  Cancer stage at diagnosis- see onc note for staging.      Assessment and Plan    Ariel Braun is a 63 y.o. 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      Chronic anxiety/panic attacks: Did not have panic/anxiety attacks for 2.5 weeks but had an episode after that, states that the episode was not as intense as her previous episodes and notices that it  is becoming less intense but still prevents her from functioning. States that she notices that she experiences her eyes turning red, with sensations in her mouth, lightheadedness with sweating and paralysis during her panic attack episodes but states that the episodes are not lasting  as long as prior episodes and feels tired after each episode.   - Supportive listening and counseling provided.  GLENWOOD Ip talks at length about her daughter who developed extreme fatigue and chronic GI illness after acute gastroenteritis treated with abx and then steroids over 1 year ago.  This is a major source of stress for her and her daughter has lost her job because of this condition.  She has seen many different doctors and specialists to no avail.  Being treated now for IBS like illness.  Writer suggested daughter be screened for adrenal insufficiency.   Her family may be more at risk for autoimmune dz if husband has type 1 DM.  Discussed that adrenal insufficiency can cause nausea, chronic abdominal pain, chronic diarrhea, dizziness and lightheadedness with fatigue caused by low cortisol levels.  Recommended reaching out to her PCP or endocrinology regarding initial evaluation with AM cortisol levels.  If test is indeterminate, she would need cort stim testing.    - Can take 5mg  of Propanolol tid prn panic attacks. Can take 10mg  if 5mg  ineffective. Suspect BP and HR elevated during episode and the dose is low so doubt it will cause any hypotension.  She says she will try it but has not done so due to concern for side effects.  Does not need any more refills.  Previously discussed:  -suspect due, in part to Dex 20mg  once a week, this has been tapered to 12mg  a week and she was told she could go to 8mg  a week if needed.  She wants to know if there is any adverse effect of tapering Dex on her cancer and advised she message her oncologist about this.  Writer did ask for a decrease of Dex but only if it was safe to do so with respect  to her cancer treatment. She has also been under a lot of stress with her health and family's health issues.    - On Pristiq  25 mg PO qAM with noticeable benefit.  Cont.  She does not want to increase.    - Discussed about mindfulness-based stress reduction program that focuses on different stress-reduction techniques that also involve medication and breathing techniques which can help stay more in the present and worry less about the future. Writer will reach out regarding information about the program. Also recommended following up with a [facility]lean based therapist and reminded her they could probably see her over video.   -have spoken to Inocente Shams and myeloma team plans to dose reduce Dex slightly if safe to do so. Myles Ip that Dex can worsen anxiety. We discussed increasing Pristiq  but would like to dose reduce Dex 1st if possible.  -she declines benzos (has tried a few in the past) due to addiction risk  -list of Christian therapists provided local to her in [place] from [employer] website. Advised she could likely see them via video.  -she asks Clinical research associate for more freq follow up and finds the visits helpful.     From prior visits:    Cancer-directed treatment: Currently undergoing cancer treatment with IPd therapy. Finds that the symptoms are manageable.  - Her WBC tends to decline towards the end of her cancer treatment cycle and uses Zarxio  and Nivestym  injection q14h. Was previously visiting [facility] for receiving injections q7d.  Previously discussed:  - Currently on Ninalro/Pom/Dex. Discussed that steroids like dexamethasone  can increase her anxiet.      Advance Care Planning   - Prognostic awareness: N/A  -  Relevant values/priorities: Reduction in anxiety, care for her son.    - Surrogate decision maker: Not addressed  - Resuscitation preferences: Unknown  -  AD: Deferred  - POLST: Deferred    - GOC convo deferred to future visit  - Today I spent 0 minutes with the patient specifically discussing ACP.      Time Spent  I spent a total of 55 minutes on this patient's care on the day of their visit excluding time spent related to any billed procedures. This time includes time spent with the patient as well as time spent documenting in the medical record, reviewing patient's records and tests, obtaining history, placing orders, communicating with other healthcare professionals, counseling the patient, family, or caregiver, and/or care coordination for the diagnoses above.    I performed this evaluation using real-time telehealth tools, including a live video Zoom connection between my location and the patient's location. Prior to initiating, the patient consented to perform this evaluation using telehealth tools.        Thank you for allowing us  to participate in the care of your patient. Please feel free to reach out with any questions, concerns or ideas about additional ways we could serve your patient & their family.    Follow-up: 2 months    Deward Pasco Schlatter, MD    I, Shereen Mahalingam am acting as a scribe for services provided by Deward Pasco Schlatter, MD on 05/01/24, 3:26 PM.     The above scribed documentation as annotated by me accurately reflects the services I have provided.   Deward Pasco Yosgar Demirjian, MD  05/06/2024 6:05 AM

## 2024-05-09 MED ORDER — POMALYST 2 MG CAPSULE
2 | ORAL_CAPSULE | ORAL | 0 refills | Status: DC
Start: 2024-05-09 — End: 2024-06-03

## 2024-05-11 LAB — COMPLETE BLOOD COUNT WITH DIFFERENTIAL
% Basophils: 0 %
% Eosinophils: 5 %
% Lymphocytes: 52 %
% Monocytes: 14 %
% Neutrophils: 29 %
Abs Basophils: 0 10*3/uL (ref 0.0–0.2)
Abs Eosinophils: 0.1 10*3/uL (ref 0.0–0.4)
Abs Lymphocytes: 1.2 10*3/uL (ref 0.7–3.1)
Abs Monocytes: 0.3 10*3/uL (ref 0.1–0.9)
Abs Neutrophils: 0.7 10*3/uL — ABNORMAL LOW (ref 1.4–7.0)
Hematocrit: 38.8 % (ref 34.0–46.6)
Hemoglobin: 12.5 g/dL (ref 11.1–15.9)
MCH: 29 pg (ref 26.6–33.0)
MCHC: 32.2 g/dL (ref 31.5–35.7)
MCV: 90 fL (ref 79–97)
Platelet Count: 179 10*3/uL (ref 150–450)
RBC Count: 4.31 x10E6/uL (ref 3.77–5.28)
RDW-CV: 17.9 % — ABNORMAL HIGH (ref 11.7–15.4)
WBC Count: 2.3 10*3/uL — CL (ref 3.4–10.8)

## 2024-05-11 LAB — COMPREHENSIVE METABOLIC PANEL
AST: 20 IU/L (ref 15–59)
Alanine transaminase: 20 IU/L (ref 0–35)
Albumin, Serum / Plasma: 4.1 g/dL (ref 3.9–4.9)
Alkaline Phosphatase: 99 IU/L (ref 44–121)
BUN/Creatinine Ratio (External Lab): 30 — ABNORMAL HIGH (ref 12–28)
Bilirubin, Total: 1.1 mg/dL (ref 0.0–1.2)
Calcium, total, Serum / Plasma: 9.2 mg/dL (ref 8.7–10.3)
Carbon Dioxide, Total: 21 mmol/L (ref 20–29)
Chloride, Serum / Plasma: 108 mmol/L — ABNORMAL HIGH (ref 96–106)
Creatinine: 0.54 mg/dL — ABNORMAL LOW (ref 0.57–1.00)
Globulin, Total: 2.2 g/dL (ref 1.5–4.5)
Glucose, non-fasting: 78 mg/dL (ref 70–99)
Potassium, Serum / Plasma: 3.8 mmol/L (ref 3.5–5.2)
Protein, Total, Serum / Plasma: 6.3 g/dL (ref 6.0–8.5)
Sodium, Serum / Plasma: 142 mmol/L (ref 134–144)
Urea Nitrogen, serum/plasma: 16 mg/dL (ref 8–27)
eGFRcr: 103 mL/min/{1.73_m2} (ref >59)

## 2024-05-11 LAB — LACTATE DEHYDROGENASE, BLOOD: LDH, serum/plasma: 141 IU/L (ref 119–226)

## 2024-05-13 LAB — IMMUNOFIXATION ELECTROPHORESIS, SERUM
IgA, Serum: 76 mg/dL — ABNORMAL LOW (ref 87–352)
IgG: 906 mg/dL (ref 586–1602)
IgM, Serum: 34 mg/dL (ref 26–217)

## 2024-05-13 LAB — KAPPA AND LAMBDA FREE LIGHT CHAINS, SERUM
Kappa Light Chain, Serum, Free: 62.3 mg/L — ABNORMAL HIGH (ref 3.3–19.4)
Kappa/Lambda Ratio, Serum, Free: 6.17 — ABNORMAL HIGH (ref 0.26–1.65)
Lambda Light Chain, Serum, Free: 10.1 mg/L (ref 5.7–26.3)

## 2024-05-13 LAB — PROTEIN ELECTROPHORESIS, SERUM
A/G Ratio: 1.2 (ref 0.7–1.7)
Albumin, sPEP: 3.5 g/dL (ref 2.9–4.4)
Alpha 1 Globulin: 0.3 g/dL (ref 0.0–0.4)
Alpha 2 Globulin: 0.7 g/dL (ref 0.4–1.0)
Beta Globulin: 1.1 g/dL (ref 0.7–1.3)
Gamma Globulin: 0.9 g/dL (ref 0.4–1.8)
Globulin, Total: 2.9 g/dL (ref 2.2–3.9)
M-Protein (monoclonal), Serum: 0.7 g/dL — ABNORMAL HIGH
Protein, Total, Serum / Plasma: 6.4 g/dL (ref 6.0–8.5)

## 2024-05-13 LAB — CYTOMEGALOVIRUS DNA, QUANTITATIVE PCR, PLASMA: Cytomegalovirus DNA, Quantitative PCR: NEGATIVE [IU]/mL

## 2024-06-03 MED ORDER — POMALYST 2 MG CAPSULE
2 | ORAL_CAPSULE | ORAL | 0 refills | Status: DC
Start: 2024-06-03 — End: 2024-07-03

## 2024-06-08 LAB — COMPLETE BLOOD COUNT WITH DIFFERENTIAL
% Basophils: 0 %
% Eosinophils: 2 %
% Lymphocytes: 61 %
% Monocytes: 10 %
% Neutrophils: 27 %
Abs Basophils: 0 x10E3/uL (ref 0.0–0.2)
Abs Eosinophils: 0.1 x10E3/uL (ref 0.0–0.4)
Abs Lymphocytes: 1.5 x10E3/uL (ref 0.7–3.1)
Abs Monocytes: 0.3 x10E3/uL (ref 0.1–0.9)
Abs Neutrophils: 0.7 x10E3/uL — ABNORMAL LOW (ref 1.4–7.0)
Hematocrit: 39.3 % (ref 34.0–46.6)
Hemoglobin: 12.5 g/dL (ref 11.1–15.9)
MCH: 28.7 pg (ref 26.6–33.0)
MCHC: 31.8 g/dL (ref 31.5–35.7)
MCV: 90 fL (ref 79–97)
Platelet Count: 175 x10E3/uL (ref 150–450)
RBC Count: 4.36 x10E6/uL (ref 3.77–5.28)
RDW-CV: 17.6 % — ABNORMAL HIGH (ref 11.7–15.4)
WBC Count: 2.5 x10E3/uL — ABNORMAL LOW (ref 3.4–10.8)

## 2024-06-08 LAB — COMPREHENSIVE METABOLIC PANEL
AST: 20 IU/L (ref 15–59)
Alanine transaminase: 18 IU/L (ref 0–35)
Albumin, Serum / Plasma: 4.2 g/dL (ref 3.9–4.9)
Alkaline Phosphatase: 111 IU/L (ref 44–121)
BUN/Creatinine Ratio (External Lab): 25 (ref 12–28)
Bilirubin, Total: 0.8 mg/dL (ref 0.0–1.2)
Calcium, total, Serum / Plasma: 9.5 mg/dL (ref 8.7–10.3)
Carbon Dioxide, Total: 23 mmol/L (ref 20–29)
Chloride, Serum / Plasma: 108 mmol/L — ABNORMAL HIGH (ref 96–106)
Creatinine: 0.56 mg/dL — ABNORMAL LOW (ref 0.57–1.00)
Globulin, Total: 2.3 g/dL (ref 1.5–4.5)
Glucose, non-fasting: 87 mg/dL (ref 70–99)
Potassium, Serum / Plasma: 4 mmol/L (ref 3.5–5.2)
Protein, Total, Serum / Plasma: 6.5 g/dL (ref 6.0–8.5)
Sodium, Serum / Plasma: 143 mmol/L (ref 134–144)
Urea Nitrogen, serum/plasma: 14 mg/dL (ref 8–27)
eGFRcr: 102 mL/min/1.73 (ref >59)

## 2024-06-09 LAB — LACTATE DEHYDROGENASE, BLOOD: LDH, serum/plasma: 154 IU/L (ref 119–226)

## 2024-06-11 NOTE — Progress Notes (Signed)
 06/04/24 ANC was 0.7. Messaged patient on 06/11/24 to self inject zarxio .

## 2024-06-12 LAB — PROTEIN ELECTROPHORESIS, SERUM

## 2024-06-13 LAB — PROTEIN ELECTROPHORESIS, SERUM
A/G Ratio: 1.1 (ref 0.7–1.7)
Albumin, sPEP: 3.3 g/dL (ref 2.9–4.4)
Alpha 1 Globulin: 0.3 g/dL (ref 0.0–0.4)
Alpha 2 Globulin: 0.7 g/dL (ref 0.4–1.0)
Beta Globulin: 1.1 g/dL (ref 0.7–1.3)
Gamma Globulin: 0.9 g/dL (ref 0.4–1.8)
Globulin, Total: 2.9 g/dL (ref 2.2–3.9)
M-Protein (monoclonal), Serum: 0.7 g/dL — ABNORMAL HIGH
Protein, Total, Serum / Plasma: 6.2 g/dL (ref 6.0–8.5)

## 2024-06-13 LAB — KAPPA AND LAMBDA FREE LIGHT CHAINS, SERUM
Kappa Light Chain, Serum, Free: 78.1 mg/L — ABNORMAL HIGH (ref 3.3–19.4)
Kappa/Lambda Ratio, Serum, Free: 6.85 — ABNORMAL HIGH (ref 0.26–1.65)
Lambda Light Chain, Serum, Free: 11.4 mg/L (ref 5.7–26.3)

## 2024-06-13 LAB — IMMUNOFIXATION ELECTROPHORESIS, SERUM
IgA, Serum: 75 mg/dL — ABNORMAL LOW (ref 87–352)
IgG: 978 mg/dL (ref 586–1602)
IgM, Serum: 32 mg/dL (ref 26–217)

## 2024-06-13 LAB — CYTOMEGALOVIRUS DNA, QUANTITATIVE PCR, PLASMA: Cytomegalovirus DNA, Quantitative PCR: NEGATIVE [IU]/mL

## 2024-06-14 LAB — CYTOMEGALOVIRUS DNA, QUANTITATIVE PCR, PLASMA: Cytomegalovirus DNA, Quantitative PCR: NEGATIVE [IU]/mL

## 2024-06-26 ENCOUNTER — Telehealth: Admit: 2024-06-26 | Discharge: 2024-06-26 | Payer: PRIVATE HEALTH INSURANCE | Attending: Physician

## 2024-06-26 DIAGNOSIS — D849 Immunodeficiency, unspecified: Secondary | ICD-10-CM

## 2024-06-26 NOTE — Progress Notes (Signed)
 Ariel Braun  [id number]    Date of Service: 06/26/2024    Reason for visit / Chief Complaints:    Subjective   Ariel Braun is a 63 y.o. female with MM who is on VIDEO follow-up    I performed this consultation using real-time Telehealth tools, including a live video connection between my location and the patient's location. Prior to initiating the consultation, I obtained informed verbal consent to perform this consultation using Telehealth tools and answered all the questions about the Telehealth interaction. Patient in [place] .    Events since the last visit: The patient has been ON ixazomib/pom/dex therapy. Tolerating OK, but does feel tired with Nilaro - also feels warm. Takes 8 mg dex and feels better on lower dose dex. No "hot spots" or new bone pain. No significant signs of progressive MM. No kidney/urinary difficuties. No f/chills, URI symptoms, sinus congestion, sore throat or other infectious symptoms. Has been sheltering. No N/V/dysuria/diarrhea.  No CP, palp.  No bleeding or bruising.    Oncology history:  DX:   IgG kappa myeloma, diagnosed 09/07/10   - BMBx: 8% plasma cells, normal cyto 46XX, FISH showed only 3 copies 1q21, Stage 2 ISS (3.88 beta 2 microglobulin),IgG 4750, M-spike 3.2, kappa 586.9 mg/L.     TREATMENT  1. LINE1: RVD 10/2010 through April 2011 (? 4 cycles). Achieved VGPR with M-spike 0.4, IgG 886, kappa 27.6.   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 M-spike 0.6, IgG 1270.     3. LINE2: 08/05/14: Started Rev 15 mg 3/4. M-spike 1, IgG 1640, kappa 62.2 - SD    Biochemical PD 04/17/19: M-1.4, KLC 96.3. ? PD. Up Rev to 15 mg qd (3/4) and dex 8 mg wkly(4/4).       07/20/19: L hip pain. M-1.2, KLC 96. Cont. Rev 15 mg (3/4)/dex 8 mg (4/4).     LINE4: Pom 3, Dara, Dex 8 (3/4).      Severe infection-sepsis 12/22 - all meds stopped. Admit [facility] 11/2021-01/2022  - CMV colitis, PE, Cholecystitis.  - Hospitalized in [facility] thru  6/2  - Hospitalized [facility] 6/14 - 7/13 - colitis/Noro    LINE5: Ixa/Pom/dex Cycle #1 07/2023       Past Medical History:   Diagnosis Date    Acid reflux disease     Anxiety     Depression     Hereditary angioedema (CMS code)     Multiple myeloma, without mention of having achieved remission     IgG kappa    Myeloma (CMS code) 05/10/2011     Past medical, family and social histories, as well as medications and allergies, were reviewed and updated in the medical record as appropriate.    Current Outpatient Medications   Medication Sig Dispense Refill    acyclovir  (ZOVIRAX ) 400 mg tablet TAKE 1 TABLET (400 MG TOTAL) BY MOUTH IN THE MORNING AND IN THE EVENING 180 tablet 3    apixaban  (ELIQUIS ) 5 mg tablet Take 1 tablet (5 mg total) by mouth 2 (two) times daily      desvenlafaxine  succinate (PRISTIQ ) 25 mg 24 hr tablet TAKE 1 TABLET BY MOUTH EVERY DAY IN THE MORNING 90 tablet 1    dexAMETHasone  (DECADRON ) 4 mg tablet Take 5 tablets (20 mg total) by mouth every 7 (seven) days TAKE 5 TABLETS (20MG ) WEEKLY PRIOR TO iXAZOMIB. DO NOT TAKE FOR WEEK OFF IXAZOMIB 45 tablet  0    filgrastim -sndz (ZARXIO ) 300 mcg/0.5 mL injection syringe Inject 0.5 mL (300 mcg total) under the skin daily as needed (for anc<1) 6 mL 1    ixazomib (NINLARO ) 3 mg CAP Take 3 mg by mouth every 7 (seven) days TAKE EVERY & DAYS D1, D8 and D15 OF A 28 DAY CYCLE 27 capsule 0    ondansetron  (ZOFRAN ) 8 mg tablet Take 1 tablet (8 mg total) by mouth every 8 (eight) hours as needed for Nausea      POMALYST  2 mg capsule TAKE 1 CAPSULE BY MOUTH 1 TIME A DAY AT BEDTIME FOR 21 DAYS ON THEN 7 DAYS OFF 21 capsule 0    propranoloL  (INDERAL ) 10 mg tablet TAKE 0.5-1 TABLETS (5-10 MG TOTAL) BY MOUTH 3 (THREE) TIMES DAILY AS NEEDED (FOR PANIC ATTACKS. TAKE AT 1ST SIGN OF ATTACK) 90 tablet 1     No current facility-administered medications for this visit.       ALLERGIES  Allergies/Contraindications   Allergen Reactions    Diphenoxylate -Atropine  Abdominal Pain     Severe stomach  pain     Caffeine Anxiety     "Jitters"       PHYSICAL EXAM- LIMIT EXAM BY VIDEO  Objective: ECOG1  Constitutional:  Pleasant over video  HENT:  Normocephalic, Neck- Supple.   SKIN: No rash noted Eyes:  PERRL, EOMI, sclera appear clear  Neurologic:  Alert & interactive, nl affect, No focal deficits noted.       RESULTS: Labs in [facility] were reviewed. Lab results reviewed and interpretation provided to patient  Most recent relevant labs   Component Value Date    WBC Count 2.5 (L) 06/08/2024    Hemoglobin 12.5 06/08/2024    Hematocrit 39.3 06/08/2024    MCV 90 06/08/2024    Platelet Count 175 06/08/2024     Most recent relevant labs   Component Value Date    Creatinine 0.56 (L) 06/08/2024    Creatinine (POCT) 0.8 06/05/2014         Outside Labs:    No results found.  ASSESSMENT & PLAN  1. Multiple Myeloma: Impression: established - stable - on therapy - requiring disease monitoring  This patient is on therapy and at high-risk for side effects and progression and requiring intensive monitoring. The patient's therapy is Ixaz/Pom and dex - she has been doing OK.      We reviewed the labs: SIFE, SPEP, SFLC, CBC => m-prot 0.7, KLC 78.1, LLC 11.4, Cr 0.56, Hgb 12.5. No lab signs of progression.     We discussed that she is doing fine. Will continue therapy as is (Ixazomib 3 mg weekly 3/4 weeks, Pom 2 mg Day 1-21 and dex 8 mg weekly). Will check MM labs every 4-6 weeks.     The patient has a high complexity problem: multiple myeloma, which is an incurable malignancy and the patient is receiving active therapy, which have side effects and poses a threat to life and bodily function.    Regarding data complexity, I have reviewed the following tests => SPEP, SIFE, SFLCs, CBC and CMP.      Medical Decision Making/ Plan: - Continue on Ixaz, Pom and dex therapy   - Continue to follow myeloma protein levels every 4-6 weeks  - Follow-up in 6-=8 weeks with Inocente and 3-4 months with Gladis.     2. Immunocompromised state/Infectious  Disease Concerns: Impression: established - immunocompromised from disease/treatment:  The patient is immunocompromised and is at high-risk from  infection due to therapy. Needs to be up-to-date on Covid vaccine. Has been avoiding exposure. No recent exposures.    Requiring close follow-up for symptoms.  At risk for VZV reactivation and is on ACV. No current signs of infection. Will call with new signs of infection        Plan: - Continue prophylaxis  - yearly flu vaccine and recommend covid vaccine this fall.  - Avoid Covid exposure  - The patient will call with any symptoms or signs of infection    3. Depression/Psychologic/Social Stressors: Impression: established - stable  The patient has cancer and has situation depression and is at risk for anxiety. Needs close follow-up    Currently, the attitude is good. There are no signs of anxiety or active depression. Mood appropriate, understands plan      Plan: No additional medications needed    4. Anemia: Impression: established - stable    The blood counts are good, follow. Next CBC in 3-4 weeks locally.      Plan: No transfusions or cytokines needed today.    5. Neutropenia/Thrombocytopenia: Impression: established - stable    The treatment has toxicity on the bone marrow - no current signs of suppression    Plan: No cytokines or transfusions    6. RI/ Electrolyte Replacement: Impression: established - stable     Taking ample fluids and avoiding nephrotoxins.  Lytes & Cr Okay, appears euvolemic. Appetite good and weight unchanged. No new medications needed.     Plan: Continue adequate fluid intake & balanced, nutritional diet   - Continue as much exercise as possible.  - No additional supplementation needed.    7. Osteopenia: Impression: established - stable:    The patient is at risk for fracture due to MM. We recommend Calcium  and Vit D replacement. We also recommend an aggressive exercise program.    Plan: - No Biphosphonates    8. Secondary Cancer Risk:  impression: established - NEEDS close follow-up  The patient has high-risk of secondary malignancies. Needs close follow-up and routine cancer screening.    Plan: - Derm follow-up at least yearly recommended  - Colonoscopy every 5 years  - Screening per PCP.

## 2024-07-02 MED ORDER — DESVENLAFAXINE SUCCINATE ER 25 MG TABLET,EXTENDED RELEASE 24 HR
25 | ORAL_TABLET | Freq: Every morning | ORAL | 1 refills | 30.00000 days | Status: DC
Start: 2024-07-02 — End: 2024-11-20

## 2024-07-03 MED ORDER — NINLARO 3 MG CAPSULE
3 | ORAL_CAPSULE | ORAL | 0 refills | 28.00000 days | Status: DC
Start: 2024-07-03 — End: 2024-12-13

## 2024-07-03 MED ORDER — POMALYST 2 MG CAPSULE
2 | ORAL_CAPSULE | ORAL | 0 refills | 28.00000 days | Status: DC
Start: 2024-07-03 — End: 2024-07-28

## 2024-07-07 LAB — COMPLETE BLOOD COUNT WITH DIFFERENTIAL
% Basophils: 3 %
% Eosinophils: 3 %
% Lymphocytes: 50 %
% Monocytes: 19 %
% Neutrophils: 25 %
Abs Basophils: 0.1 x10E3/uL (ref 0.0–0.2)
Abs Eosinophils: 0.1 x10E3/uL (ref 0.0–0.4)
Abs Lymphocytes: 1 x10E3/uL (ref 0.7–3.1)
Abs Monocytes: 0.4 x10E3/uL (ref 0.1–0.9)
Abs Neutrophils: 0.5 x10E3/uL — ABNORMAL LOW (ref 1.4–7.0)
Hematocrit: 35 % (ref 34.0–46.6)
Hemoglobin: 11.1 g/dL (ref 11.1–15.9)
MCH: 28.8 pg (ref 26.6–33.0)
MCHC: 31.7 g/dL (ref 31.5–35.7)
MCV: 91 fL (ref 79–97)
Platelet Count: 168 x10E3/uL (ref 150–450)
RBC Count: 3.86 x10E6/uL (ref 3.77–5.28)
RDW-CV: 17.4 % — ABNORMAL HIGH (ref 11.7–15.4)
WBC Count: 2 x10E3/uL — ABNORMAL LOW (ref 3.4–10.8)

## 2024-07-07 LAB — COMPREHENSIVE METABOLIC PANEL
AST: 21 IU/L (ref 15–59)
Alanine transaminase: 20 IU/L (ref 0–35)
Albumin, Serum / Plasma: 4 g/dL (ref 3.9–4.9)
Alkaline Phosphatase: 90 IU/L (ref 44–121)
BUN/Creatinine Ratio (External Lab): 31 — ABNORMAL HIGH (ref 12–28)
Bilirubin, Total: 0.9 mg/dL (ref 0.0–1.2)
Calcium, total, Serum / Plasma: 9 mg/dL (ref 8.7–10.3)
Carbon Dioxide, Total: 21 mmol/L (ref 20–29)
Chloride, Serum / Plasma: 108 mmol/L — ABNORMAL HIGH (ref 96–106)
Creatinine: 0.51 mg/dL — ABNORMAL LOW (ref 0.57–1.00)
Globulin, Total: 2 g/dL (ref 1.5–4.5)
Glucose, non-fasting: 82 mg/dL (ref 70–99)
Potassium, Serum / Plasma: 3.8 mmol/L (ref 3.5–5.2)
Protein, Total, Serum / Plasma: 6 g/dL (ref 6.0–8.5)
Sodium, Serum / Plasma: 142 mmol/L (ref 134–144)
Urea Nitrogen, serum/plasma: 16 mg/dL (ref 8–27)
eGFRcr: 104 mL/min/1.73 (ref >59)

## 2024-07-07 LAB — LACTATE DEHYDROGENASE, BLOOD: LDH, serum/plasma: 136 IU/L (ref 119–226)

## 2024-07-12 LAB — PROTEIN ELECTROPHORESIS, SERUM

## 2024-07-13 LAB — IMMUNOFIXATION ELECTROPHORESIS, SERUM
IgA, Serum: 68 mg/dL — ABNORMAL LOW (ref 87–352)
IgG: 866 mg/dL (ref 586–1602)
IgM, Serum: 26 mg/dL (ref 26–217)

## 2024-07-13 LAB — CYTOMEGALOVIRUS DNA, QUANTITATIVE PCR, PLASMA: Cytomegalovirus DNA, Quantitative PCR: NEGATIVE [IU]/mL

## 2024-07-13 LAB — PROTEIN ELECTROPHORESIS, SERUM
A/G Ratio: 1.2 (ref 0.7–1.7)
Albumin, sPEP: 3.2 g/dL (ref 2.9–4.4)
Alpha 1 Globulin: 0.4 g/dL (ref 0.0–0.4)
Alpha 2 Globulin: 0.6 g/dL (ref 0.4–1.0)
Beta Globulin: 1 g/dL (ref 0.7–1.3)
Gamma Globulin: 0.7 g/dL (ref 0.4–1.8)
Globulin, Total: 2.7 g/dL (ref 2.2–3.9)
M-Protein (monoclonal), Serum: 0.5 g/dL — ABNORMAL HIGH
Protein, Total, Serum / Plasma: 5.9 g/dL — ABNORMAL LOW (ref 6.0–8.5)

## 2024-07-13 LAB — KAPPA AND LAMBDA FREE LIGHT CHAINS, SERUM
Kappa Light Chain, Serum, Free: 73.3 mg/L — ABNORMAL HIGH (ref 3.3–19.4)
Kappa/Lambda Ratio, Serum, Free: 7.8 — ABNORMAL HIGH (ref 0.26–1.65)
Lambda Light Chain, Serum, Free: 9.4 mg/L (ref 5.7–26.3)

## 2024-07-18 ENCOUNTER — Telehealth: Admit: 2024-07-18 | Discharge: 2024-07-18 | Payer: PRIVATE HEALTH INSURANCE

## 2024-07-18 DIAGNOSIS — F410 Panic disorder [episodic paroxysmal anxiety] without agoraphobia: Secondary | ICD-10-CM

## 2024-07-18 NOTE — Progress Notes (Signed)
 Subjective    Ariel Braun is a 63 y.o. female with multiple myeloma (diagnosis category: Multiple myeloma (including amyloidosis)  metastatic) who is being seen via video visit while patient is at home at the request of Dr. Particia Shams of (referral category: Oncology/Rad Onc/Onc Surgery) for symptom management.    Medical team: PCP Dr. Marjorie, oncologist Dr. Inocente Shams.  Hepatologist Dr. Wyvonna Domino.    Primary caregiver(s):  Her daughter  Palliative care team member disciplines: Physician    HPI  Ariel Braun is a 63 y.o. female with anxiety and mental stressors.    Today patient/family reports:     Anxiety/panic attack:  - Her panic attacks have improved and states that these attacks are not as strong as prior. Her last panic attack was on 08/20. Has had benefit with using propranolol  which reduced the intensity of the attack and has only used it once. States that it works well if taken right at the onset. Has medication supply with her when she goes out and near her at home.  - Has been dealing with medical issues with her family being their caregiver. Feels overwhelmed at times trying to process the situation and states that she is taking one day at a time moving forward. Works hard to keep herself and the family healthy and self-sufficient. Feels that there is no proper communication between different providers offering care and she has to work hard coordinating care which has been time consuming for her. Appreciates Dr. Hillard and writer's help and support.  - Reports that the past month have been very challenging and difficult due to medical issues such as imaging and evaluation with [name] and trying to get an appointment at [facility] for [place]. 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 He also has ectopic parathyroid adenoma.  States that [place] a nodule that is growing on the back of his throat closer to the opening of his throat was found to be a brown tumor with a biopsy and has other similar tumors growing. Reports she was informed that [name] needs 3-4 surgeries moving forward and is following up with 4-5 departments for the treatment plan for planning but has not received it yet. Has an appointment with a surgeon tomorrow  and has reached out to him regarding the imaging and other results.  States Scientist, water quality informed that his endocrinologist would be coordinating care.  - States that her daughter Ariel Braun is not able to leave the house as she is very fatigued and has not left the house for at least 1.5 years. Gets very carsick and is sensitive to most of the food including carbs and spices due to which she eats the same food every day. Has followed up with multiple providers and has an upcoming appointment with [facility] at [facility] at 12/30 but was provided urgent referral.   - Takes Pristiq  25 mg PO qD. Has noticed significant improvement in her clarity, ability to cope up with the situation and having more sense of control and executive function involving prioritizing tasks and getting it done which was previously overwhelming for her.  - Watches movies and spiritual/religious programs as a family on Sunday and considers it as "family day", finds it restful. Tries to go outdoors sometimes to revive herself.     Social:  - She is  fluent in [place] and speaks with her mother. Also speaks [place]. Has been dealing with her mother's illness since she was a child and was translating for her as her mother speaks [place].  States that her mother was a first Engineer, drilling [place] speaker here. Daughter understands but does not speak much [place].    Social history most significant for:  Lives with husband, mom and son, who has intellectual disability but can take care of himself.  Isolated.   Cares for son along with help from daughter, whom she is close to.  Daughter also lives with her.    She reports she is in a caregiver role in her family.    - Has recently had her daughter's birthday celebration and enjoyed it.    Spiritual:  - Born and raised Catholic but tends to gain more spiritual information and guidance from principles that are Christian-based and Bible-based and applies those principles and traditions in real life.  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    - She and her daughter watch a lot of [place]le[place] programs that are uplifting and feels that it provides positive direction and helps her focus on good things in her life. Prays and reads the Bible and states that this helps.    Fam hx: Strong fam hx of addiction.  Father was alcoholic.  Mom used valium to cope.  Brothers with drug and alcohol addiction.      Patient/Family screened for spiritual care needs: Yes  Patient/Family screened for psychosocial needs: Yes    SMS Well-Being Survey      06/26/2023     9:37 PM 08/03/2023     9:49 AM 01/23/2024     4:14 PM   SMS Well-Being Scores   Pain  8 0   Shortness of breath  0 0   Constipation 8 8 3    Tiredness  5 7   Nausea  0 0   Depression  3 0   Anxiety 7 7 8    Drowsiness  7    Appetite 0 9 0   Feeling of wellbeing 5 7 3    Other problem Sometimes feel shakiness (inside feeling not trimmers)  At times feel like it's too much med.     "I feel at peace." A moderate amount A moderate amount A moderate amount   "At times I worry I will be a burden to my family." A moderate amount A moderate amount Not at all   How would you rate your overall quality of life? Metta Metta Metta   DPOA I have an Advanced Health Care Directive Health Care Directive               Objective    Physical Exam:   NAD  Sitting on couch  Normal neck ROM  Normal resp effort    Performance status (by Palliative Performance Scale): 70% - Reduced ambulation, Unable Normal Job/Work, Significant disease, Occasional assistance necessary,  Normal or reduced intake, Full conscious level      Review of Prior Testing  I have personally reviewed and interpreted the following studies:  Most recent relevant labs   Component Value Date    WBC Count 2.0 (L) 07/06/2024    Hemoglobin 11.1 07/06/2024    Hematocrit 35.0 07/06/2024    MCV 91 07/06/2024    Platelet Count 168 07/06/2024     Most recent relevant labs   Component Value Date    Sodium, Serum / Plasma 142 07/06/2024  Potassium, Serum / Plasma 3.8 07/06/2024    Urea Nitrogen, serum/plasma 16 07/06/2024    Creatinine 0.51 (L) 07/06/2024     Most recent relevant labs   Component Value Date    Thyroid Stimulating Hormone 3.98 12/15/2021     No results found.    #  Cancer stage at diagnosis- see onc note for staging.      Assessment and Plan    Ariel Braun is a 63 y.o. 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      Chronic anxiety/panic attacks: Her panic attacks have improved and states that these attacks are not as strong as prior. Her last panic attack was on 08/20. Has had benefit with using propranolol  5-10mg  which reduced the intensity of the attack and has only used it once. States that it works well if taken right at the onset. Has medication supply with her when she goes out and near her at home.  Happy to have access to this.    - Supportive listening and counseling provided.  - Continue propranolol  5-10mg  prn panic attacks given that she has had benefit.   - Continue Pristiq  25 mg PO qD as she has benefit with it.  Sounds like her executive function is better on it.    Ariel Braun talks at length about her son, [name] who has a history of hyperparathyroidism and acromegaly with brown tumors and ectopic parathyroid adenoma and her daughter, Ariel Braun who has developed extreme fatigue and has been having an extremely limited diet due to her health condition.  This is a major source of stress for her as she has been coordinating care for her family and states that the medical team does not have a definitive medical treatment plan for her son. Encouraged reaching out to [facility] endocrine surgeon regarding coordinating care as she feels that the treatment plan has not been definitive.    Previously discussed:  - Did not have panic/anxiety attacks for 2.5 weeks but had an episode after that, states that the episode was not as intense as her previous episodes and notices that it is becoming less intense but still prevents her from functioning. States that she notices that she experiences her eyes turning red, with sensations in her mouth, lightheadedness with sweating and paralysis during her panic attack episodes but states that the episodes are not lasting as long as prior episodes and feels tired after each episode.   Ariel Braun talks at length about her daughter who developed extreme fatigue and chronic GI illness after acute gastroenteritis treated with abx and then steroids over 1 year ago.  This is a major source of stress for her and her daughter has lost her job because of this condition.  She has seen many different doctors and specialists to no avail.  Being treated now for IBS like illness.  Writer suggested daughter be screened for adrenal insufficiency.   Her family may be more at risk for autoimmune dz if husband has type 1 DM.  Discussed that adrenal insufficiency can cause nausea, chronic abdominal pain, chronic diarrhea, dizziness and lightheadedness with fatigue caused by low cortisol levels.  Recommended reaching out to her PCP or endocrinology regarding initial evaluation with AM cortisol levels.  If test is indeterminate, she would need cort stim testing.    - Can take 5mg  of Propanolol tid prn panic  attacks. Can take 10mg  if 5mg  ineffective. Suspect BP and HR elevated during episode and the dose is low so doubt it will cause any hypotension.  She says  she will try it but has not done so due to concern for side effects.  Does not need any more refills.  -suspect due, in part to Dex 20mg  once a week, this has been tapered to 12mg  a week and she was told she could go to 8mg  a week if needed.  She wants to know if there is any adverse effect of tapering Dex on her cancer and advised she message her oncologist about this.  Writer did ask for a decrease of Dex but only if it was safe to do so with respect to her cancer treatment. She has also been under a lot of stress with her health and family's health issues.    - On Pristiq  25 mg PO qAM with noticeable benefit.  Cont.  She does not want to increase.    - Discussed about mindfulness-based stress reduction program that focuses on different stress-reduction techniques that also involve medication and breathing techniques which can help stay more in the present and worry less about the future. Writer will reach out regarding information about the program. Also recommended following up with a [facility]le[facility] based therapist and reminded her they could probably see her over video.   -have spoken to Inocente Shams and myeloma team plans to dose reduce Dex slightly if safe to do so. Myles Ip that Dex can worsen anxiety. We discussed increasing Pristiq  but would like to dose reduce Dex 1st if possible.  -she declines benzos (has tried a few in the past) due to addiction risk  -list of Christian therapists provided local to her in [place] from [employer] website. Advised she could likely see them via video.  -she asks Clinical research associate for more freq follow up and finds the visits helpful.     From prior visits:    Cancer-directed treatment: Currently undergoing cancer treatment with IPd therapy. Finds that the symptoms are manageable.  - Her WBC tends to decline towards the end of her cancer treatment cycle and uses Zarxio  and Nivestym  injection q14h. Was previously visiting [facility] for receiving injections q7d.  Previously discussed:  -  Currently on Ninalro/Pom/Dex. Discussed that steroids like dexamethasone  can increase her anxiety.      Advance Care Planning   - Prognostic awareness: N/A  - Relevant values/priorities: Reduction in anxiety, care for her son/daughter and herself and her family.    - Surrogate decision maker: Not addressed  - Resuscitation preferences: Unknown  -  AD: Deferred  - POLST: Deferred    - GOC convo deferred to future visit  - Today I spent 0 minutes with the patient specifically discussing ACP.     Time Spent  I spent a total of 47 minutes on this patient's care on the day of their visit excluding time spent related to any billed procedures. This time includes time spent with the patient as well as time spent documenting in the medical record, reviewing patient's records and tests, obtaining history, placing orders, communicating with other healthcare professionals, counseling the patient, family, or caregiver, and/or care coordination for the diagnoses above.    I performed this evaluation using real-time telehealth tools, including a live video Zoom connection between my location and the patient's location. Prior to initiating, the patient consented to perform this evaluation using telehealth tools.  Thank you for allowing us  to participate in the care of your patient. Please feel free to reach out with any questions, concerns or ideas about additional ways we could serve your patient & their family.    Follow-up: October 02, 2024 at 02:30 PM    Deward Pasco Schlatter, MD    I, Shereen Mahalingam am acting as a scribe for services provided by Deward Pasco Schlatter, MD on 07/18/24, 1:39 PM.     The above scribed documentation as annotated by me accurately reflects the services I have provided.   Deward Pasco Schlatter, MD  07/18/2024 6:42 PM

## 2024-07-28 MED ORDER — POMALYST 2 MG CAPSULE
2 | ORAL_CAPSULE | ORAL | 0 refills | Status: DC
Start: 2024-07-28 — End: 2024-08-21

## 2024-08-06 LAB — COMPREHENSIVE METABOLIC PANEL
AST: 26 IU/L (ref 15–59)
Alanine transaminase: 20 IU/L (ref 0–35)
Albumin, Serum / Plasma: 4.1 g/dL (ref 3.9–4.9)
Alkaline Phosphatase: 97 IU/L (ref 49–135)
BUN/Creatinine Ratio (External Lab): 26 (ref 12–28)
Bilirubin, Total: 0.9 mg/dL (ref 0.0–1.2)
Calcium, total, Serum / Plasma: 9.4 mg/dL (ref 8.7–10.3)
Carbon Dioxide, Total: 23 mmol/L (ref 20–29)
Chloride, Serum / Plasma: 107 mmol/L — ABNORMAL HIGH (ref 96–106)
Creatinine: 0.57 mg/dL (ref 0.57–1.00)
Globulin, Total: 2.3 g/dL (ref 1.5–4.5)
Glucose, non-fasting: 88 mg/dL (ref 70–99)
Potassium, Serum / Plasma: 3.9 mmol/L (ref 3.5–5.2)
Protein, Total, Serum / Plasma: 6.4 g/dL (ref 6.0–8.5)
Sodium, Serum / Plasma: 142 mmol/L (ref 134–144)
Urea Nitrogen, serum/plasma: 15 mg/dL (ref 8–27)
eGFRcr: 101 mL/min/1.73 (ref >59)

## 2024-08-06 LAB — CBC WITH DIFFERENTIAL/PLATELET (LABCORP)
% Basophils: 0 %
% Eosinophils: 0 %
% Lymphocytes: 44 %
% Monocytes: 22 %
% Neutrophils: 34 %
Abs Basophils: 0 x10E3/uL (ref 0.0–0.2)
Abs Eosinophils: 0 x10E3/uL (ref 0.0–0.4)
Abs Lymphocytes: 0.8 x10E3/uL (ref 0.7–3.1)
Abs Monocytes: 0.4 x10E3/uL (ref 0.1–0.9)
Abs Neutrophils: 0.6 x10E3/uL — ABNORMAL LOW (ref 1.4–7.0)
Hematocrit: 37.5 % (ref 34.0–46.6)
Hemoglobin: 11.9 g/dL (ref 11.1–15.9)
MCH: 28.8 pg (ref 26.6–33.0)
MCHC: 31.7 g/dL (ref 31.5–35.7)
MCV: 91 fL (ref 79–97)
Platelet Count: 155 x10E3/uL (ref 150–450)
RBC Count: 4.13 x10E6/uL (ref 3.77–5.28)
RDW-CV: 16.6 % — ABNORMAL HIGH (ref 11.7–15.4)
WBC Count: 1.8 x10E3/uL — ABNORMAL LOW (ref 3.4–10.8)

## 2024-08-07 ENCOUNTER — Telehealth: Admit: 2024-08-07 | Discharge: 2024-08-07 | Payer: PRIVATE HEALTH INSURANCE | Attending: Nurse Practitioner

## 2024-08-07 DIAGNOSIS — D849 Immunodeficiency, unspecified: Secondary | ICD-10-CM

## 2024-08-07 LAB — LACTATE DEHYDROGENASE, BLOOD: LDH, serum/plasma: 192 IU/L (ref 119–226)

## 2024-08-07 NOTE — Patient Instructions (Addendum)
 Continue Ixazomib, Pom and dex as prescribed    Will continue to trend labs monthly    Continue to work with Dr. Jodi to manage stress and anxiety    We talked about options of bispecific antibodies, such as teclistamab (BCMA target) and talquetamab (GPRC5D target) and CAR T (BCMA target) and clinical trial (targets both GPRC5D and BCMA)    Follow up with Ariel Braun in 6 weeks

## 2024-08-07 NOTE — Progress Notes (Signed)
 Ariel Braun  [id number]    Date of Service: 08/07/2024    Reason for visit / Chief Complaints:    Subjective   Ariel Braun is a 63 y.o. female with MM who is on VIDEO follow-up    I performed this consultation using real-time Telehealth tools, including a live video connection between my location and the patient's location. Prior to initiating the consultation, I obtained informed verbal consent to perform this consultation using Telehealth tools and answered all the questions about the Telehealth interaction. Patient in [place] .    Events since the last visit: The patient has been ON ixazomib/pom/dex therapy. Tolerating OK, but does feel tired with Ninlaro  - also feels warm. Takes 8 mg dex and feels better on lower dose dex. Had 3 panic attacks this month, last panic attack was the Friday before most recent labs. Worried that stress may have caused a jump in light chain. No "hot spots" or new bone pain. No significant signs of progressive MM. No kidney/urinary difficuties. No f/chills, URI symptoms, sinus congestion, sore throat or other infectious symptoms. Has been sheltering. No N/V/dysuria/diarrhea.  No CP, palp.  No bleeding or bruising.        Oncology history:  DX:   IgG kappa myeloma, diagnosed 09/07/10   - BMBx: 8% plasma cells, normal cyto 46XX, FISH showed only 3 copies 1q21, Stage 2 ISS (3.88 beta 2 microglobulin),IgG 4750, M-spike 3.2, kappa 586.9 mg/L.     TREATMENT  1. LINE1: RVD 10/2010 through April 2011 (? 4 cycles). Achieved VGPR with M-spike 0.4, IgG 886, kappa 27.6.   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 M-spike 0.6, IgG 1270.     3. LINE2: 08/05/14: Started Rev 15 mg 3/4. M-spike 1, IgG 1640, kappa 62.2 - SD    Biochemical PD 04/17/19: M-1.4, KLC 96.3. ? PD. Up Rev to 15 mg qd (3/4) and dex 8 mg wkly(4/4).       07/20/19: L hip pain. M-1.2, KLC 96. Cont. Rev 15 mg (3/4)/dex 8 mg (4/4).     LINE4: Pom 3, Dara, Dex 8 (3/4).       Severe infection-sepsis 12/22 - all meds stopped. Admit [place] 11/2021-01/2022  - CMV colitis, PE, Cholecystitis.  - Hospitalized in [facility] thru 6/2  - Hospitalized [place] 6/14 - 7/13 - colitis/Noro    LINE5: Ixa/Pom/dex Cycle #1 07/2023       Past Medical History:   Diagnosis Date    Acid reflux disease     Acute thromboembolism of deep veins of lower extremity (CMS code)     Two separate times- Blood clots in lungs and blood clot in left leg    Allergy Hereditary Angioedema    Diagnosed 1995    Anxiety 12/24    Anxiety/Panic attacks - dealing with a lot of Family medical situations    Cancer (CMS code) Multiple Myeloma    Diagnosed 09/2010    Colon polyp Discovered in Colonoscopy    Depression     Fissure, anal     Hereditary angioedema (CMS code)     Immune deficiency disorder     Multiple myeloma, without mention of having achieved remission     IgG kappa    Myeloma (CMS code) 05/10/2011     Past medical, family and social histories, as well as medications and allergies, were reviewed and updated in the medical record as appropriate.  Current Outpatient Medications   Medication Sig Dispense Refill    acyclovir  (ZOVIRAX ) 400 mg tablet TAKE 1 TABLET (400 MG TOTAL) BY MOUTH IN THE MORNING AND IN THE EVENING 180 tablet 3    apixaban  (ELIQUIS ) 5 mg tablet Take 1 tablet (5 mg total) by mouth in the morning and 1 tablet (5 mg total) before bedtime.      desvenlafaxine  succinate (PRISTIQ ) 25 mg 24 hr tablet TAKE 1 TABLET BY MOUTH EVERY DAY IN THE MORNING 90 tablet 1    dexAMETHasone  (DECADRON ) 4 mg tablet Take 5 tablets (20 mg total) by mouth every 7 (seven) days TAKE 5 TABLETS (20MG ) WEEKLY PRIOR TO iXAZOMIB. DO NOT TAKE FOR WEEK OFF IXAZOMIB (Patient taking differently: Take 2 tablets (8 mg total) by mouth every 7 (seven) days TAKE 5 TABLETS (20MG ) WEEKLY PRIOR TO iXAZOMIB. DO NOT TAKE FOR WEEK OFF IXAZOMIB) 45 tablet 0    filgrastim -sndz (ZARXIO ) 300 mcg/0.5 mL injection syringe Inject 0.5 mL (300 mcg total)  under the skin daily as needed (for anc<1) 6 mL 1    ixazomib (NINLARO ) 3 mg CAP Take 3 mg by mouth every 7 (seven) days TAKE EVERY & DAYS D1, D8 and D15 OF A 28 DAY CYCLE 27 capsule 0    ondansetron  (ZOFRAN ) 8 mg tablet Take 1 tablet (8 mg total) by mouth every 8 (eight) hours as needed for Nausea      POMALYST  2 mg capsule TAKE 1 CAPSULE BY MOUTH 1 TIME A DAY AT BEDTIME FOR 21 DAYS ON THEN 7 DAYS OFF 21 capsule 0    propranoloL  (INDERAL ) 10 mg tablet TAKE 0.5-1 TABLETS (5-10 MG TOTAL) BY MOUTH 3 (THREE) TIMES DAILY AS NEEDED (FOR PANIC ATTACKS. TAKE AT 1ST SIGN OF ATTACK) 90 tablet 1     No current facility-administered medications for this visit.       ALLERGIES  Allergies/Contraindications   Allergen Reactions    Diphenoxylate -Atropine  Abdominal Pain     Severe stomach pain     Caffeine Anxiety     "Jitters"       PHYSICAL EXAM- LIMIT EXAM BY VIDEO  Objective: ECOG1  Constitutional:  Pleasant over video  HENT:  Normocephalic, Neck- Supple.   SKIN: No rash noted Eyes:  PERRL, EOMI, sclera appear clear  Neurologic:  Alert & interactive, nl affect, No focal deficits noted.       RESULTS: Labs in [facility] were reviewed. Lab results reviewed and interpretation provided to patient  Most recent relevant labs   Component Value Date    WBC Count 1.8 (L) 08/06/2024    Hemoglobin 11.9 08/06/2024    Hematocrit 37.5 08/06/2024    MCV 91 08/06/2024    Platelet Count 155 08/06/2024     Most recent relevant labs   Component Value Date    Creatinine 0.57 08/06/2024    Creatinine (POCT) 0.8 06/05/2014         Outside Labs:    No results found.  ASSESSMENT & PLAN  1. Multiple Myeloma: Impression: established - stable - on therapy - requiring disease monitoring  This patient is on therapy and at high-risk for side effects and progression and requiring intensive monitoring. The patient's therapy is Ixaz/Pom and dex - she has been doing OK.      We reviewed the labs: SIFE, SPEP, SFLC, CBC => m-prot 0.7, KLC 90.2, LLC 9.4, Cr 0.57, Hgb  11.9. No lab signs of progression.     We discussed that myeloma remains  relatively stable. Will continue therapy as is (Ixazomib 3 mg weekly 3/4 weeks, Pom 2 mg Day 1-21 and dex 8 mg weekly). Will check MM labs every 4-6 weeks.     Patient asked about future therapies, so we discussed options of bispecifics, car T and clinical trials. At this time, she does not meet PD and thus should remain on current treatment.    The patient has a high complexity problem: multiple myeloma, which is an incurable malignancy and the patient is receiving active therapy, which have side effects and poses a threat to life and bodily function.    Regarding data complexity, I have reviewed the following tests => SPEP, SIFE, SFLCs, CBC and CMP.      Medical Decision Making/ Plan: - Continue on Ixaz, Pom and dex therapy   - Continue to follow myeloma protein levels every 4-6 weeks  - Follow-up in 6-=8 weeks with Inocente and 3-4 months with Gladis.     2. Immunocompromised state/Infectious Disease Concerns: Impression: established - immunocompromised from disease/treatment:  The patient is immunocompromised and is at high-risk from infection due to therapy. Needs to be up-to-date on Covid vaccine. Has been avoiding exposure. No recent exposures.    Requiring close follow-up for symptoms.  At risk for VZV reactivation and is on ACV. No current signs of infection. Will call with new signs of infection        Plan: - Continue prophylaxis  - yearly flu vaccine and recommend covid vaccine this fall.  - Avoid Covid exposure  - The patient will call with any symptoms or signs of infection    3. Depression/Psychologic/Social Stressors: Impression: established - stable  The patient has cancer and has situation depression and is at risk for anxiety. Needs close follow-up    Currently, the attitude is good. There are no signs of anxiety or active depression. Mood appropriate, understands plan      Plan: No additional medications needed    4. Anemia:  Impression: established - stable    The blood counts are good, follow. Next CBC in 3-4 weeks locally.      Plan: No transfusions or cytokines needed today.    5. Neutropenia/Thrombocytopenia: Impression: established - stable    The treatment has toxicity on the bone marrow - no current signs of suppression    Plan: No cytokines or transfusions    6. RI/ Electrolyte Replacement: Impression: established - stable     Taking ample fluids and avoiding nephrotoxins.  Lytes & Cr Okay, appears euvolemic. Appetite good and weight unchanged. No new medications needed.     Plan: Continue adequate fluid intake & balanced, nutritional diet   - Continue as much exercise as possible.  - No additional supplementation needed.    7. Osteopenia: Impression: established - stable:    The patient is at risk for fracture due to MM. We recommend Calcium  and Vit D replacement. We also recommend an aggressive exercise program.    Plan: - No Biphosphonates    8. Secondary Cancer Risk: impression: established - NEEDS close follow-up  The patient has high-risk of secondary malignancies. Needs close follow-up and routine cancer screening.    Plan: - Derm follow-up at least yearly recommended  - Colonoscopy every 5 years  - Screening per PCP.      Inocente GORMAN Shams NP  Hematology, Blood & Marrow Transplant, and Cellular Therapy  [facility]  office [phone]  fax [phone]  08/12/24  APP Visit Information:   APP Service Type:  Independent  Available MD consultant:  Debby JUDITHANN Lunger III, MD    I spent a total of 50 non-overlapping minutes on this patient's care on the day of their visit excluding time spent related to any billed procedures. This time includes time spent with the patient as well as time spent documenting in the medical record, reviewing patient's records and tests, obtaining history, placing orders, communicating with other healthcare professionals, counseling the patient, family, or  caregiver, and/or care coordination for the diagnoses above.

## 2024-08-14 LAB — PROTEIN ELECTROPHORESIS, SERUM

## 2024-08-22 LAB — PROTEIN ELECTROPHORESIS, SERUM
A/G Ratio: 1.1 (ref 0.7–1.7)
Albumin, sPEP: 3.3 g/dL (ref 2.9–4.4)
Alpha 1 Globulin: 0.3 g/dL (ref 0.0–0.4)
Alpha 2 Globulin: 0.7 g/dL (ref 0.4–1.0)
Beta Globulin: 1.1 g/dL (ref 0.7–1.3)
Gamma Globulin: 0.8 g/dL (ref 0.4–1.8)
Globulin, Total: 2.9 g/dL (ref 2.2–3.9)
M-Protein (monoclonal), Serum: 0.6 g/dL — ABNORMAL HIGH
Protein, Total, Serum / Plasma: 6.2 g/dL (ref 6.0–8.5)

## 2024-08-22 LAB — IMMUNOFIXATION ELECTROPHORESIS, SERUM
IgA, Serum: 66 mg/dL — ABNORMAL LOW (ref 87–352)
IgG: 967 mg/dL (ref 586–1602)
IgM, Serum: 27 mg/dL (ref 26–217)

## 2024-08-22 LAB — KAPPA AND LAMBDA FREE LIGHT CHAINS, SERUM
Kappa Light Chain, Serum, Free: 90.2 mg/L — ABNORMAL HIGH (ref 3.3–19.4)
Kappa/Lambda Ratio, Serum, Free: 9.6 — ABNORMAL HIGH (ref 0.26–1.65)
Lambda Light Chain, Serum, Free: 9.4 mg/L (ref 5.7–26.3)

## 2024-08-22 LAB — CYTOMEGALOVIRUS DNA, QUANTITATIVE PCR, PLASMA: Cytomegalovirus DNA, Quantitative PCR: NEGATIVE [IU]/mL

## 2024-08-22 MED ORDER — POMALYST 2 MG CAPSULE
2 | ORAL_CAPSULE | Freq: Every day | ORAL | 0 refills | Status: DC
Start: 2024-08-22 — End: 2024-09-18

## 2024-08-28 MED ORDER — DEXAMETHASONE 4 MG TABLET
4 | ORAL_TABLET | ORAL | 0 refills | 12.00000 days | Status: DC
Start: 2024-08-28 — End: 2024-09-03

## 2024-08-31 LAB — LACTATE DEHYDROGENASE, BLOOD: LDH, serum/plasma: 141 IU/L (ref 119–226)

## 2024-09-01 LAB — COMPREHENSIVE METABOLIC PANEL
AST: 19 IU/L (ref 15–59)
Alanine transaminase: 19 IU/L (ref 0–35)
Albumin, Serum / Plasma: 4.3 g/dL (ref 3.9–4.9)
Alkaline Phosphatase: 89 IU/L (ref 49–135)
BUN/Creatinine Ratio (External Lab): 30 — ABNORMAL HIGH (ref 12–28)
Bilirubin, Total: 1.1 mg/dL (ref 0.0–1.2)
Calcium, total, Serum / Plasma: 9.3 mg/dL (ref 8.7–10.3)
Carbon Dioxide, Total: 24 mmol/L (ref 20–29)
Chloride, Serum / Plasma: 106 mmol/L (ref 96–106)
Creatinine: 0.53 mg/dL — ABNORMAL LOW (ref 0.57–1.00)
Globulin, Total: 2 g/dL (ref 1.5–4.5)
Glucose, non-fasting: 88 mg/dL (ref 70–99)
Potassium, Serum / Plasma: 3.8 mmol/L (ref 3.5–5.2)
Protein, Total, Serum / Plasma: 6.3 g/dL (ref 6.0–8.5)
Sodium, Serum / Plasma: 142 mmol/L (ref 134–144)
Urea Nitrogen, serum/plasma: 16 mg/dL (ref 8–27)
eGFRcr: 103 mL/min/1.73 (ref >59)

## 2024-09-01 LAB — COMPLETE BLOOD COUNT WITH DIFFERENTIAL
% Basophils: 2 %
% Eosinophils: 4 %
% Immature Granulocytes: 1 %
% Lymphocytes: 60 %
% Monocytes: 10 %
% Neutrophils: 23 %
Abs Basophils: 0 x10E3/uL (ref 0.0–0.2)
Abs Eosinophils: 0.1 x10E3/uL (ref 0.0–0.4)
Abs Imm Granulocytes: 0 x10E3/uL (ref 0.0–0.1)
Abs Lymphocytes: 1.1 x10E3/uL (ref 0.7–3.1)
Abs Monocytes: 0.2 x10E3/uL (ref 0.1–0.9)
Abs Neutrophils: 0.4 x10E3/uL — CL (ref 1.4–7.0)
Hematocrit: 37.7 % (ref 34.0–46.6)
Hemoglobin: 12.2 g/dL (ref 11.1–15.9)
MCH: 28.9 pg (ref 26.6–33.0)
MCHC: 32.4 g/dL (ref 31.5–35.7)
MCV: 89 fL (ref 79–97)
Platelet Count: 161 x10E3/uL (ref 150–450)
RBC Count: 4.22 x10E6/uL (ref 3.77–5.28)
RDW-CV: 16.6 % — ABNORMAL HIGH (ref 11.7–15.4)
WBC Count: 1.9 x10E3/uL — ABNORMAL LOW (ref 3.4–10.8)

## 2024-09-02 LAB — PROTEIN ELECTROPHORESIS, SERUM

## 2024-09-03 MED ORDER — DEXAMETHASONE 4 MG TABLET
4 | ORAL_TABLET | ORAL | 3 refills | Status: AC
Start: 2024-09-03 — End: ?

## 2024-09-08 LAB — COMPREHENSIVE METABOLIC PANEL
AST: 25 IU/L (ref 15–59)
Alanine transaminase: 31 IU/L (ref 0–35)
Albumin, Serum / Plasma: 4.1 g/dL (ref 3.9–4.9)
Alkaline Phosphatase: 90 IU/L (ref 49–135)
BUN/Creatinine Ratio (External Lab): 27 (ref 12–28)
Bilirubin, Total: 0.9 mg/dL (ref 0.0–1.2)
Calcium, total, Serum / Plasma: 9.3 mg/dL (ref 8.7–10.3)
Carbon Dioxide, Total: 26 mmol/L (ref 20–29)
Chloride, Serum / Plasma: 107 mmol/L — ABNORMAL HIGH (ref 96–106)
Creatinine: 0.55 mg/dL — ABNORMAL LOW (ref 0.57–1.00)
Globulin, Total: 2.1 g/dL (ref 1.5–4.5)
Glucose, non-fasting: 90 mg/dL (ref 70–99)
Potassium, Serum / Plasma: 3.7 mmol/L (ref 3.5–5.2)
Protein, Total, Serum / Plasma: 6.2 g/dL (ref 6.0–8.5)
Sodium, Serum / Plasma: 143 mmol/L (ref 134–144)
Urea Nitrogen, serum/plasma: 15 mg/dL (ref 8–27)
eGFRcr: 102 mL/min/1.73 (ref >59)

## 2024-09-08 LAB — CBC WITH DIFFERENTIAL/PLATELET (LABCORP)
% Basophils: 1 %
% Eosinophils: 3 %
% Lymphocytes: 50 %
% Monocytes: 12 %
% Neutrophils: 28 %
Abs Basophils: 0.1 x10E3/uL (ref 0.0–0.2)
Abs Eosinophils: 0.1 x10E3/uL (ref 0.0–0.4)
Abs Lymphocytes: 1.3 x10E3/uL (ref 0.7–3.1)
Abs Monocytes: 0.3 x10E3/uL (ref 0.1–0.9)
Abs Neutrophils: 0.7 x10E3/uL — ABNORMAL LOW (ref 1.4–7.0)
Hematocrit: 38.3 % (ref 34.0–46.6)
Hemoglobin: 12.5 g/dL (ref 11.1–15.9)
MCH: 29.2 pg (ref 26.6–33.0)
MCHC: 32.6 g/dL (ref 31.5–35.7)
MCV: 90 fL (ref 79–97)
Platelet Count: 162 x10E3/uL (ref 150–450)
RBC Count: 4.28 x10E6/uL (ref 3.77–5.28)
RDW-CV: 16.9 % — ABNORMAL HIGH (ref 11.7–15.4)
WBC Count: 2.7 x10E3/uL — ABNORMAL LOW (ref 3.4–10.8)

## 2024-09-08 LAB — IMMATURE CELLS (EXTERNAL LAB)
% Metamyelocytes: 4 % — ABNORMAL HIGH (ref 0–0)
% Myelocytes: 1 % — ABNORMAL HIGH (ref 0–0)
% Promyelocytes: 1 % — ABNORMAL HIGH (ref 0–0)

## 2024-09-18 ENCOUNTER — Telehealth: Admit: 2024-09-18 | Discharge: 2024-09-18 | Payer: PRIVATE HEALTH INSURANCE | Attending: Nurse Practitioner

## 2024-09-18 DIAGNOSIS — D849 Immunodeficiency, unspecified: Secondary | ICD-10-CM

## 2024-09-18 MED ORDER — POMALYST 2 MG CAPSULE
2 | ORAL_CAPSULE | Freq: Every day | ORAL | 0 refills | 28.00000 days | Status: DC
Start: 2024-09-18 — End: 2024-09-19

## 2024-09-18 NOTE — Patient Instructions (Addendum)
 Schedule dental exam/cleaning at the end of the break week, just before you would start a new cycle.  Plan to check cbc 3 days prior to dental visit.  IF ANC <1, self inject zarxio .  Recheck cbc 24 hours after zarxio . If ANC again <1, likely will need a 2nd zarxio  before safely proceeding with dental visit.    Please HOLD myeloma therapy 1 week prior to dental extraction and check cbc prior

## 2024-09-18 NOTE — Progress Notes (Signed)
 Ariel Braun  [id number]    Date of Service: 09/18/2024    Reason for visit / Chief Complaints:    Subjective   Ariel Braun is a 63 y.o. female with MM who is on VIDEO follow-up    I performed this consultation using real-time Telehealth tools, including a live video connection between my location and the patient's location. Prior to initiating the consultation, I obtained informed verbal consent to perform this consultation using Telehealth tools and answered all the questions about the Telehealth interaction. Patient in  .    Events since the last visit: The patient has been ON ixazomib/pom/dex therapy. Tolerating OK, but does feel tired with Ninlaro  - also feels warm. Takes 8 mg dex and feels better on lower dose dex. Has weekly panic attacks, seems to happen a lot at the end of the week.  Propanolol has helped reduce the severity of the attacks. Attacks usually start with nausea, tingling of head, injected sclera, urge to have a bowel movements and back pressure.  Usually feels exhausted afterwards. Worried that stress may have caused a jump in light chain. No "hot spots" or new bone pain. No significant signs of progressive MM. No kidney/urinary difficuties. No f/chills, URI symptoms, sinus congestion, sore throat or other infectious symptoms. Has been sheltering. No N/V/dysuria/diarrhea.  No CP, palp.  No bleeding or bruising.        Oncology history:  DX:   IgG kappa myeloma, diagnosed 09/07/10   - BMBx: 8% plasma cells, normal cyto 46XX, FISH showed only 3 copies 1q21, Stage 2 ISS (3.88 beta 2 microglobulin),IgG 4750, M-spike 3.2, kappa 586.9 mg/L.     TREATMENT  1. LINE1: RVD 10/2010 through April 2011 (? 4 cycles). Achieved VGPR with M-spike 0.4, IgG 886, kappa 27.6.   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 M-spike 0.6, IgG 1270.     3. LINE2: 08/05/14: Started Rev 15 mg 3/4. M-spike 1, IgG 1640, kappa 62.2 - SD    Biochemical  PD 04/17/19: M-1.4, KLC 96.3. ? PD. Up Rev to 15 mg qd (3/4) and dex 8 mg wkly(4/4).       07/20/19: L hip pain. M-1.2, KLC 96. Cont. Rev 15 mg (3/4)/dex 8 mg (4/4).     LINE4: Pom 3, Dara, Dex 8 (3/4).      Severe infection-sepsis 12/22 - all meds stopped. Admit [place] 11/2021-01/2022  - CMV colitis, PE, Cholecystitis.  - Hospitalized in [facility] thru 6/2  - Hospitalized [place] 6/14 - 7/13 - colitis/Noro    LINE5: Ixa/Pom/dex Cycle #1 07/2023       Past Medical History:   Diagnosis Date    Acid reflux disease     Acute thromboembolism of deep veins of lower extremity (CMS code)     Two separate times- Blood clots in lungs and blood clot in left leg    Allergy Hereditary Angioedema    Diagnosed 1995    Anxiety 12/24    Anxiety/Panic attacks - dealing with a lot of Family medical situations    Cancer (CMS code) Multiple Myeloma    Diagnosed 09/2010    Colon polyp Discovered in Colonoscopy    Depression     Fissure, anal     Hereditary angioedema (CMS code)     Immune deficiency disorder     Multiple myeloma, without mention of having achieved remission     IgG kappa  Myeloma (CMS code) 05/10/2011     Past medical, family and social histories, as well as medications and allergies, were reviewed and updated in the medical record as appropriate.    Current Outpatient Medications   Medication Sig Dispense Refill    acyclovir  (ZOVIRAX ) 400 mg tablet TAKE 1 TABLET (400 MG TOTAL) BY MOUTH IN THE MORNING AND IN THE EVENING 180 tablet 3    apixaban  (ELIQUIS ) 5 mg tablet Take 1 tablet (5 mg total) by mouth in the morning and 1 tablet (5 mg total) before bedtime.      desvenlafaxine  succinate (PRISTIQ ) 25 mg 24 hr tablet TAKE 1 TABLET BY MOUTH EVERY DAY IN THE MORNING 90 tablet 1    dexAMETHasone  (DECADRON ) 4 mg tablet Take 2 tablets (8 mg total) by mouth every 7 (seven) days prior to Ixazomib 8 tablet 3    filgrastim -sndz (ZARXIO ) 300 mcg/0.5 mL injection syringe Inject 0.5 mL (300 mcg total) under the skin daily as needed (for  anc<1) 6 mL 1    ixazomib (NINLARO ) 3 mg CAP Take 3 mg by mouth every 7 (seven) days TAKE EVERY & DAYS D1, D8 and D15 OF A 28 DAY CYCLE 27 capsule 0    ondansetron  (ZOFRAN ) 8 mg tablet Take 1 tablet (8 mg total) by mouth every 8 (eight) hours as needed for Nausea      pomalidomide  (POMALYST ) 2 mg capsule Take 1 capsule (2 mg total) by mouth nightly at bedtime 21 capsule 0    propranoloL  (INDERAL ) 10 mg tablet TAKE 0.5-1 TABLETS (5-10 MG TOTAL) BY MOUTH 3 (THREE) TIMES DAILY AS NEEDED (FOR PANIC ATTACKS. TAKE AT 1ST SIGN OF ATTACK) 90 tablet 1     No current facility-administered medications for this visit.       ALLERGIES  Allergies/Contraindications   Allergen Reactions    Diphenoxylate -Atropine  Abdominal Pain     Severe stomach pain     Caffeine Anxiety     "Jitters"       PHYSICAL EXAM- LIMIT EXAM BY VIDEO  Objective: ECOG1  Constitutional:  Pleasant over video  HENT:  Normocephalic, Neck- Supple.   SKIN: No rash noted Eyes:  PERRL, EOMI, sclera appear clear  Neurologic:  Alert & interactive, nl affect, No focal deficits noted.       RESULTS: Labs in [facility] were reviewed. Lab results reviewed and interpretation provided to patient  Most recent relevant labs   Component Value Date    WBC Count 2.7 (L) 09/07/2024    Hemoglobin 12.5 09/07/2024    Hematocrit 38.3 09/07/2024    MCV 90 09/07/2024    Platelet Count 162 09/07/2024     Most recent relevant labs   Component Value Date    Creatinine 0.55 (L) 09/07/2024    Creatinine (POCT) 0.8 06/05/2014         Outside Labs:    No results found.  ASSESSMENT & PLAN  1. Multiple Myeloma: Impression: established - stable - on therapy - requiring disease monitoring  This patient is on therapy and at high-risk for side effects and progression and requiring intensive monitoring. The patient's therapy is Ixaz/Pom and dex - she has been doing OK.      We reviewed the labs: SIFE, SPEP, SFLC, CBC => m-prot 0.7, KLC 90.2, LLC 9.4, Cr 0.57, Hgb 11.9.     Has been on current therapy  since 07/2023, with starting M protein of 1.4 and KLC of 179. Best response M protein 0.5, KLC 62.3.  Currently M protein 0.6 and KLC 91.4, early signs of progression.    Will continue therapy as is (Ixazomib 3 mg weekly 3/4 weeks, Pom 2 mg Day 1-21 and dex 8 mg weekly). Will check MM labs every 4-6 weeks.     Patient asked about future therapies, so we discussed options of bispecifics, car T and clinical trials. At this time, she does not meet PD and thus should remain on current treatment.    The patient has a high complexity problem: multiple myeloma, which is an incurable malignancy and the patient is receiving active therapy, which have side effects and poses a threat to life and bodily function.    Regarding data complexity, I have reviewed the following tests => SPEP, SIFE, SFLCs, CBC and CMP.      Medical Decision Making/ Plan: - Continue on Ixaz, Pom and dex therapy   - last day of pill for this cycle will be 11/9 - next labs likely on 11/14  - Continue to follow myeloma protein levels every 4-6 weeks  - Follow-up in 6-8 weeks with Inocente and 3-4 months with Gladis.     2. Immunocompromised state/Infectious Disease Concerns: Impression: established - immunocompromised from disease/treatment:  The patient is immunocompromised and is at high-risk from infection due to therapy. Needs to be up-to-date on Covid vaccine. Has been avoiding exposure. No recent exposures.    Requiring close follow-up for symptoms.  At risk for VZV reactivation and is on ACV. No current signs of infection. Will call with new signs of infection        Plan: - Continue prophylaxis  - yearly flu vaccine and recommend covid vaccine this fall.  - Avoid Covid exposure  - The patient will call with any symptoms or signs of infection    3. Depression/Psychologic/Social Stressors: Impression: established - stable  The patient has cancer and has situation depression and is at risk for anxiety. Needs close follow-up    Continues to have anxiety  attacks in the setting of multiple stressors.        Plan: Continue follow up with Dr. Jodi  - continues on pristiq  and propanolol    4. Anemia: Impression: established - stable    The blood counts are good, follow. Next CBC in 3-4 weeks locally.      Plan: No transfusions needed     5. Neutropenia/Thrombocytopenia: Impression: established - stable    The treatment has toxicity on the bone marrow - no current signs of suppression    Plan: self injects zarxio  prn ANC <1  - must follow cbc closely as her ANC trend is getting lower with each cycle    6. RI/ Electrolyte Replacement: Impression: established - stable     Taking ample fluids and avoiding nephrotoxins.  Lytes & Cr Okay, appears euvolemic. Appetite good and weight unchanged. No new medications needed.     Plan: Continue adequate fluid intake & balanced, nutritional diet   - Continue as much exercise as possible.  - No additional supplementation needed.    7. Osteopenia: Impression: established - stable:    The patient is at risk for fracture due to MM. We recommend Calcium  and Vit D replacement. We also recommend an aggressive exercise program.    Plan: - No Biphosphonates    8. Secondary Cancer Risk: impression: established - NEEDS close follow-up  The patient has high-risk of secondary malignancies. Needs close follow-up and routine cancer screening.    Plan: - Derm follow-up at  least yearly recommended  - Colonoscopy every 5 years  - Screening per PCP.      Inocente GORMAN Shams NP  Hematology, Blood & Marrow Transplant, and Cellular Therapy  [place] [facility]  office [phone]  fax [phone]  09/18/24       APP Visit Information:   APP Service Type:  Independent  Available MD consultant:  Debby JUDITHANN Lunger III, MD       Complex Problem: Incurable malignancy, requires close monitoring for signs and symptoms of disease progression  Risk of complications, morbidity/mortality of patient management: high; the patient's  systemic cancer therapy requires regular and intensive monitoring for potential major/life-threatening toxicities  Data review/interpretation: I reviewed and/or ordered 3+ tests and/or documents    Patient continues on treatment for myeloma.

## 2024-09-19 LAB — IMMUNOFIXATION ELECTROPHORESIS, SERUM
IgA, Serum: 74 mg/dL — ABNORMAL LOW (ref 87–352)
IgG: 920 mg/dL (ref 586–1602)
IgM, Serum: 29 mg/dL (ref 26–217)

## 2024-09-19 LAB — PROTEIN ELECTROPHORESIS, SERUM
A/G Ratio: 1.1 (ref 0.7–1.7)
Albumin, sPEP: 3.3 g/dL (ref 2.9–4.4)
Alpha 1 Globulin: 0.3 g/dL (ref 0.0–0.4)
Alpha 2 Globulin: 0.7 g/dL (ref 0.4–1.0)
Beta Globulin: 1.1 g/dL (ref 0.7–1.3)
Gamma Globulin: 0.8 g/dL (ref 0.4–1.8)
Globulin, Total: 2.9 g/dL (ref 2.2–3.9)
M-Protein (monoclonal), Serum: 0.6 g/dL — ABNORMAL HIGH
Protein, Total, Serum / Plasma: 6.2 g/dL (ref 6.0–8.5)

## 2024-09-19 LAB — CYTOMEGALOVIRUS DNA, QUANTITATIVE PCR, PLASMA: Cytomegalovirus DNA, Quantitative PCR: NEGATIVE [IU]/mL

## 2024-09-19 LAB — KAPPA AND LAMBDA FREE LIGHT CHAINS, SERUM
Kappa Light Chain, Serum, Free: 91.4 mg/L — ABNORMAL HIGH (ref 3.3–19.4)
Kappa/Lambda Ratio, Serum, Free: 7.88 — ABNORMAL HIGH (ref 0.26–1.65)
Lambda Light Chain, Serum, Free: 11.6 mg/L (ref 5.7–26.3)

## 2024-09-19 MED ORDER — POMALYST 2 MG CAPSULE
2 | ORAL_CAPSULE | Freq: Every day | ORAL | 0 refills | Status: DC
Start: 2024-09-19 — End: 2024-10-19

## 2024-09-19 MED ORDER — AZITHROMYCIN 500 MG TABLET
500 | ORAL_TABLET | ORAL | 1 refills | 28.00000 days | Status: AC
Start: 2024-09-19 — End: ?

## 2024-09-26 LAB — COMPREHENSIVE METABOLIC PANEL
AST: 16 IU/L (ref 15–59)
Alanine transaminase: 16 IU/L (ref 0–35)
Albumin, Serum / Plasma: 4 g/dL (ref 3.9–4.9)
Alkaline Phosphatase: 82 IU/L (ref 49–135)
BUN/Creatinine Ratio (External Lab): 31 — ABNORMAL HIGH (ref 12–28)
Bilirubin, Total: 1 mg/dL (ref 0.0–1.2)
Calcium, total, Serum / Plasma: 9.2 mg/dL (ref 8.7–10.3)
Carbon Dioxide, Total: 26 mmol/L (ref 20–29)
Chloride, Serum / Plasma: 109 mmol/L — ABNORMAL HIGH (ref 96–106)
Creatinine: 0.52 mg/dL — ABNORMAL LOW (ref 0.57–1.00)
Globulin, Total: 2 g/dL (ref 1.5–4.5)
Glucose, non-fasting: 89 mg/dL (ref 70–99)
Potassium, Serum / Plasma: 3.9 mmol/L (ref 3.5–5.2)
Protein, Total, Serum / Plasma: 6 g/dL (ref 6.0–8.5)
Sodium, Serum / Plasma: 144 mmol/L (ref 134–144)
Urea Nitrogen, serum/plasma: 16 mg/dL (ref 8–27)
eGFRcr: 104 mL/min/1.73 (ref >59)

## 2024-09-26 LAB — COMPLETE BLOOD COUNT WITH DIFFERENTIAL
% Basophils: 0 %
% Eosinophils: 4 %
% Immature Granulocytes: 1 %
% Lymphocytes: 45 %
% Monocytes: 30 %
% Neutrophils: 20 %
Abs Basophils: 0 x10E3/uL (ref 0.0–0.2)
Abs Eosinophils: 0.1 x10E3/uL (ref 0.0–0.4)
Abs Imm Granulocytes: 0 x10E3/uL (ref 0.0–0.1)
Abs Lymphocytes: 0.8 x10E3/uL (ref 0.7–3.1)
Abs Monocytes: 0.5 x10E3/uL (ref 0.1–0.9)
Abs Neutrophils: 0.4 x10E3/uL — CL (ref 1.4–7.0)
Hematocrit: 35.1 % (ref 34.0–46.6)
Hemoglobin: 11.5 g/dL (ref 11.1–15.9)
MCH: 29.5 pg (ref 26.6–33.0)
MCHC: 32.8 g/dL (ref 31.5–35.7)
MCV: 90 fL (ref 79–97)
Platelet Count: 141 x10E3/uL — ABNORMAL LOW (ref 150–450)
RBC Count: 3.9 x10E6/uL (ref 3.77–5.28)
RDW-CV: 16.6 % — ABNORMAL HIGH (ref 11.7–15.4)
WBC Count: 1.8 x10E3/uL — ABNORMAL LOW (ref 3.4–10.8)

## 2024-09-27 LAB — TOTAL PROTEIN, SERUM - EXTERNAL: Total Protein, Serum - External: 6

## 2024-09-27 LAB — CBC WITH DIFFERENTIAL/PLATELET (LABCORP)
% Basophils: 1 %
% Eosinophils: 2 %
% Immature Granulocytes: 2 %
% Lymphocytes: 29 %
% Monocytes: 12 %
% Neutrophils: 54 %
Abs Basophils: 0 x10E3/uL (ref 0.0–0.2)
Abs Eosinophils: 0.1 x10E3/uL (ref 0.0–0.4)
Abs Imm Granulocytes: 0.1 x10E3/uL (ref 0.0–0.1)
Abs Lymphocytes: 0.9 x10E3/uL (ref 0.7–3.1)
Abs Monocytes: 0.4 x10E3/uL (ref 0.1–0.9)
Abs Neutrophils: 1.7 x10E3/uL (ref 1.4–7.0)
Hematocrit: 37.4 % (ref 34.0–46.6)
Hemoglobin: 12.1 g/dL (ref 11.1–15.9)
MCH: 29.2 pg (ref 26.6–33.0)
MCHC: 32.4 g/dL (ref 31.5–35.7)
MCV: 90 fL (ref 79–97)
Platelet Count: 152 x10E3/uL (ref 150–450)
RBC Count: 4.15 x10E6/uL (ref 3.77–5.28)
RDW-CV: 16.5 % — ABNORMAL HIGH (ref 11.7–15.4)
WBC Count: 3.2 x10E3/uL — ABNORMAL LOW (ref 3.4–10.8)

## 2024-09-27 LAB — COMPREHENSIVE METABOLIC PANEL
AST: 18 IU/L (ref 15–59)
Alanine transaminase: 16 IU/L (ref 0–35)
Albumin, Serum / Plasma: 4 g/dL (ref 3.9–4.9)
Alkaline Phosphatase: 88 IU/L (ref 49–135)
BUN/Creatinine Ratio (External Lab): 29 — ABNORMAL HIGH (ref 12–28)
Bilirubin, Total: 1.1 mg/dL (ref 0.0–1.2)
Calcium, total, Serum / Plasma: 9.3 mg/dL (ref 8.7–10.3)
Carbon Dioxide, Total: 23 mmol/L (ref 20–29)
Chloride, Serum / Plasma: 109 mmol/L — ABNORMAL HIGH (ref 96–106)
Creatinine: 0.52 mg/dL — ABNORMAL LOW (ref 0.57–1.00)
Globulin, Total: 2 g/dL (ref 1.5–4.5)
Glucose, non-fasting: 92 mg/dL (ref 70–99)
Potassium, Serum / Plasma: 3.9 mmol/L (ref 3.5–5.2)
Protein, Total, Serum / Plasma: 6 g/dL (ref 6.0–8.5)
Sodium, Serum / Plasma: 144 mmol/L (ref 134–144)
Urea Nitrogen, serum/plasma: 15 mg/dL (ref 8–27)
eGFRcr: 104 mL/min/1.73 (ref >59)

## 2024-09-27 LAB — MONOCYTE ABS COUNT - EXTERNAL: Abs Monocytes - External: 0.4

## 2024-09-27 LAB — AST - EXTERNAL: AST - External: 18

## 2024-09-27 LAB — MCV - EXTERNAL: MCV - External: 90

## 2024-09-27 LAB — CREATININE, SERUM - EXTERNAL: Creatinine, Serum - External: 0.52

## 2024-09-27 LAB — CARBON DIOXIDE (CO2) - EXTERNAL: CO2 - External: 23

## 2024-09-27 LAB — BUN - EXTERNAL: BUN - External: 15

## 2024-09-27 LAB — HEMATOCRIT - EXTERNAL: Hematocrit - External: 37.4

## 2024-09-27 LAB — PLATELETS - EXTERNAL: Platelets - External: 152

## 2024-09-27 LAB — ALKALINE PHOSPHATASE - EXTERNAL: Alkaline Phosphatase - External: 88

## 2024-09-27 LAB — GLUCOSE, RANDOM - EXTERNAL: Glucose, Ser/Pl - External: 92

## 2024-09-27 LAB — HEMOGLOBIN - EXTERNAL: Hemoglobin - External: 12.1

## 2024-09-27 LAB — EOSINOPHIL ABS COUNT - EXTERNAL: Abs Eosinophils - External: 0.1

## 2024-09-27 LAB — RBC - EXTERNAL: RBC - External: 4.15

## 2024-09-27 LAB — CHLORIDE, SERUM/PLASMA - EXTERNAL: Chloride, Ser/Pl - External: 109

## 2024-09-27 LAB — POTASSIUM - EXTERNAL: Potassium - External: 3.9

## 2024-09-27 LAB — WBC - EXTERNAL: WBC - External: 3.2

## 2024-09-27 LAB — BILIRUBIN, TOTAL - EXTERNAL: Bilirubin, Total - External: 1.1

## 2024-09-27 LAB — ALT - EXTERNAL: ALT - External: 16

## 2024-09-27 LAB — ALC (ABSOLUTE LYMPHOCYTE COUNT) - EXTERNAL: Abs Lymphocytes - External: 0.9

## 2024-09-27 LAB — EXTERNAL RESULT REPORT SCANNED (YES/NO)?

## 2024-09-27 LAB — SODIUM - EXTERNAL: Sodium - External: 144

## 2024-09-27 LAB — CALCIUM - EXTERNAL: Calcium - External: 9.3

## 2024-09-27 LAB — ANC (ABSOLUTE NEUTROPHIL COUNT) - EXTERNAL: Abs Neutrophils (ANC) - External: 1.7

## 2024-09-28 LAB — LACTATE DEHYDROGENASE, BLOOD: LDH, serum/plasma: 136 IU/L (ref 119–226)

## 2024-09-29 LAB — COMPREHENSIVE METABOLIC PANEL
AST: 20 IU/L (ref 15–59)
Alanine transaminase: 17 IU/L (ref 0–35)
Albumin, Serum / Plasma: 4.1 g/dL (ref 3.9–4.9)
Alkaline Phosphatase: 95 IU/L (ref 49–135)
BUN/Creatinine Ratio (External Lab): 25 (ref 12–28)
Bilirubin, Total: 1.4 mg/dL — ABNORMAL HIGH (ref 0.0–1.2)
Calcium, total, Serum / Plasma: 9.2 mg/dL (ref 8.7–10.3)
Carbon Dioxide, Total: 23 mmol/L (ref 20–29)
Chloride, Serum / Plasma: 105 mmol/L (ref 96–106)
Creatinine: 0.56 mg/dL — ABNORMAL LOW (ref 0.57–1.00)
Globulin, Total: 2.2 g/dL (ref 1.5–4.5)
Glucose, non-fasting: 86 mg/dL (ref 70–99)
Potassium, Serum / Plasma: 3.7 mmol/L (ref 3.5–5.2)
Protein, Total, Serum / Plasma: 6.3 g/dL (ref 6.0–8.5)
Sodium, Serum / Plasma: 142 mmol/L (ref 134–144)
Urea Nitrogen, serum/plasma: 14 mg/dL (ref 8–27)
eGFRcr: 102 mL/min/1.73 (ref >59)

## 2024-09-29 LAB — COMPLETE BLOOD COUNT WITH DIFFERENTIAL
% Basophils: 1 %
% Eosinophils: 1 %
% Immature Granulocytes: 1 %
% Lymphocytes: 25 %
% Monocytes: 16 %
% Neutrophils: 56 %
Abs Basophils: 0 x10E3/uL (ref 0.0–0.2)
Abs Eosinophils: 0 x10E3/uL (ref 0.0–0.4)
Abs Imm Granulocytes: 0.1 x10E3/uL (ref 0.0–0.1)
Abs Lymphocytes: 1.3 x10E3/uL (ref 0.7–3.1)
Abs Monocytes: 0.8 x10E3/uL (ref 0.1–0.9)
Abs Neutrophils: 2.9 x10E3/uL (ref 1.4–7.0)
Hematocrit: 39.6 % (ref 34.0–46.6)
Hemoglobin: 12.9 g/dL (ref 11.1–15.9)
MCH: 29.2 pg (ref 26.6–33.0)
MCHC: 32.6 g/dL (ref 31.5–35.7)
MCV: 90 fL (ref 79–97)
Platelet Count: 155 x10E3/uL (ref 150–450)
RBC Count: 4.42 x10E6/uL (ref 3.77–5.28)
RDW-CV: 16.8 % — ABNORMAL HIGH (ref 11.7–15.4)
WBC Count: 5.2 x10E3/uL (ref 3.4–10.8)

## 2024-09-29 LAB — PROTEIN ELECTROPHORESIS, SERUM

## 2024-10-02 ENCOUNTER — Telehealth: Admit: 2024-10-02 | Discharge: 2024-10-02 | Payer: PRIVATE HEALTH INSURANCE

## 2024-10-02 DIAGNOSIS — F419 Anxiety disorder, unspecified: Secondary | ICD-10-CM

## 2024-10-02 NOTE — Progress Notes (Signed)
 Subjective    Ariel Braun is a 63 y.o. female with multiple myeloma (diagnosis category: Multiple myeloma (including amyloidosis)  metastatic) who is being seen via video visit while patient is at home at the request of Dr. Particia Shams of (referral category: Oncology/Rad Onc/Onc Surgery) for symptom management.    Medical team: PCP Dr. Marjorie, oncologist Dr. Inocente Shams.  Hepatologist Dr. Wyvonna Domino.    Primary caregiver(s):  Her daughter  Palliative care team member disciplines: Physician    HPI  Ariel Braun is a 63 y.o. female with anxiety and mental stressors.    Today patient/family reports:     Anxiety/panic attack:  -attacks weekly.  Feels something- sensation in mouth and immediately takes propanolol 10mg  and helps a lot.  Less severe.  Come on at end of week.  Once took one and then took another 1/2 later as was not helping. Did help when took 2      -she is always doing something at home.   Likes to walk.  However, does not walk much.  Gets tired easily with exercise machines. Afraid to use exercise bike due to her back.  Sciatic nerve problems.      -leg stretches daily.  Helps.  Did PT to walk again for 4 months.  Never fully regained leg strength.      -Jessica sometimes feels like she will faint.  Not having much luck.  Feels like going in circles and hard to access care.  Hard to have blood drawn.  Very fatigued afterwards.  She is underweight at 100#.  Limited in what she can eat.  She does not know what else to do.  Says it drives her frustration and panic attacks.  Ordered cortisol but she could not have drawn.  May have MCAS.  Takes a lot of supplements.  Drinks electrolytes.  IV once a month.      -stressful past few months.  Son is going to have surgery.      Social:  - She is fluent in Portuguese and speaks with her mother. Also speaks Spanish. Has been dealing with her mother's illness since she was a child and was translating for her as her mother speaks Portuguese.  States that  her mother was a first engineer, drilling Portuguese speaker here. Daughter understands but does not speak much Portuguese.    Social history most significant for:  Lives with husband, mom and son, who has intellectual disability but can take care of himself.  Isolated.  Cares for son along with help from daughter, whom she is close to.  Daughter also lives with her.    She reports she is in a caregiver role in her family.    - Has recently had her daughter's birthday celebration and enjoyed it.    Spiritual:  - Born and raised Catholic but tends to gain more spiritual information and guidance from principles that are Christian-based and Bible-based and applies those principles and traditions in real life.  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    - She and her daughter watch a lot of [facility] programs that are uplifting and feels that it provides positive direction and helps her focus on good things in her life. Prays and reads the Bible and states that this helps.    Fam hx: Strong fam hx of addiction.  Father was alcoholic.  Mom used valium to cope.  Brothers with drug and alcohol addiction.      Patient/Family screened for  spiritual care needs: Yes  Patient/Family screened for psychosocial needs: Yes    SMS Well-Being Survey      06/26/2023     9:37 PM 08/03/2023     9:49 AM 01/23/2024     4:14 PM 10/02/2024     2:14 PM   SMS Well-Being Scores   Pain  8 0 0   Shortness of breath  0 0 0   Constipation 8 8 3 5    Tiredness  5 7 7    Nausea  0 0 0   Depression  3 0 0   Anxiety 7 7 8 8    Drowsiness  7  5   Appetite 0 9 0 0   Feeling of wellbeing 5 7 3 7    Other problem Sometimes feel shakiness (inside feeling not trimmers)  At times feel like it's too much med.      "I feel at peace." A moderate amount A moderate amount A moderate amount A moderate amount   "At times I worry I will be a burden to my family." A moderate amount A moderate amount Not at all A moderate amount   How would you rate your overall quality of life? Good  Metta Metta Metta   DPOA I have an Advanced Health [employer] Directive Health Care Directive     No DPOA    I am no sure what a "DPOA" is              Objective    Physical Exam:   NAD  Sitting on couch  Normal neck ROM  Normal resp effort    Performance status (by Palliative Performance Scale): 70% - Reduced ambulation, Unable Normal Job/Work, Significant disease, Occasional assistance necessary, Normal or reduced intake, Full conscious level      Review of Prior Testing  I have personally reviewed and interpreted the following studies:  Most recent relevant labs   Component Value Date    WBC Count 5.2 09/28/2024    Hemoglobin 12.9 09/28/2024    Hematocrit 39.6 09/28/2024    MCV 90 09/28/2024    Platelet Count 155 09/28/2024     Most recent relevant labs   Component Value Date    Sodium, Serum / Plasma 142 09/28/2024    Potassium, Serum / Plasma 3.7 09/28/2024    Urea Nitrogen, serum/plasma 14 09/28/2024    Creatinine 0.56 (L) 09/28/2024     Most recent relevant labs   Component Value Date    Thyroid Stimulating Hormone 3.98 12/15/2021     No results found.    #  Cancer stage at diagnosis- see onc note for staging.      Assessment and Plan    Ariel Braun is a 63 y.o. 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      Chronic anxiety/panic attacks: Getting weekly at end of week.  Immediately takes 10mg  propnaolol and helps abort attack.    - Supportive listening and counseling provided.  - Continue propranolol  10mg  prn panic attacks given that she has had benefit.   - Continue Pristiq  25 mg PO qD as she has benefit with it.  Sounds like her executive function is better on it.    GLENWOOD Hadassah talks at length about her son, [name] who has a history of hyperparathyroidism and acromegaly with brown tumors and ectopic parathyroid adenoma and her  daughter, [name] who has developed extreme  fatigue and has been having an extremely limited diet due to her health condition. This is a major source of stress for her as she has been coordinating care for her family and states that the medical team does not have a definitive medical treatment plan for her daughter.  Spoke to [place] briefly and her symptoms could be c/w adrenal insufficiency.  They already have AM cortisol ordered by another physician.  Encouraged her to complete lab draw.    Previously discussed:  - Did not have panic/anxiety attacks for 2.5 weeks but had an episode after that, states that the episode was not as intense as her previous episodes and notices that it is becoming less intense but still prevents her from functioning. States that she notices that she experiences her eyes turning red, with sensations in her mouth, lightheadedness with sweating and paralysis during her panic attack episodes but states that the episodes are not lasting as long as prior episodes and feels tired after each episode.   GLENWOOD Ip talks at length about her daughter who developed extreme fatigue and chronic GI illness after acute gastroenteritis treated with abx and then steroids over 1 year ago.  This is a major source of stress for her and her daughter has lost her job because of this condition.  She has seen many different doctors and specialists to no avail.  Being treated now for IBS like illness.  Writer suggested daughter be screened for adrenal insufficiency.   Her family may be more at risk for autoimmune dz if husband has type 1 DM.  Discussed that adrenal insufficiency can cause nausea, chronic abdominal pain, chronic diarrhea, dizziness and lightheadedness with fatigue caused by low cortisol levels.  Recommended reaching out to her PCP or endocrinology regarding initial evaluation with AM cortisol levels.  If test is indeterminate, she would need cort stim testing.    - Can take 5mg  of Propanolol tid prn panic attacks. Can take 10mg  if 5mg   ineffective. Suspect BP and HR elevated during episode and the dose is low so doubt it will cause any hypotension.  She says she will try it but has not done so due to concern for side effects.  Does not need any more refills.  -suspect due, in part to Dex 20mg  once a week, this has been tapered to 12mg  a week and she was told she could go to 8mg  a week if needed.  She wants to know if there is any adverse effect of tapering Dex on her cancer and advised she message her oncologist about this.  Writer did ask for a decrease of Dex but only if it was safe to do so with respect to her cancer treatment. She has also been under a lot of stress with her health and family's health issues.    - On Pristiq  25 mg PO qAM with noticeable benefit.  Cont.  She does not want to increase.    - Discussed about mindfulness-based stress reduction program that focuses on different stress-reduction techniques that also involve medication and breathing techniques which can help stay more in the present and worry less about the future. Writer will reach out regarding information about the program. Also recommended following up with a [facility] based therapist and reminded her they could probably see her over video.   -have spoken to Inocente Shams and myeloma team plans to dose reduce Dex slightly if safe to do so. Myles Ip that Dex  can worsen anxiety. We discussed increasing Pristiq  but would like to dose reduce Dex 1st if possible.  -she declines benzos (has tried a few in the past) due to addiction risk  -list of Christian therapists provided local to her in [place] from [employer] website. Advised she could likely see them via video.  -she asks clinical research associate for more freq follow up and finds the visits helpful.     From prior visits:    Cancer-directed treatment: Currently undergoing cancer treatment with IPd therapy. Finds that the symptoms are manageable.  - Her WBC tends to decline towards the end of her cancer treatment cycle and uses  Zarxio  and Nivestym  injection q14h. Was previously visiting [place] for receiving injections q7d.  Previously discussed:  - Currently on Ninalro/Pom/Dex. Discussed that steroids like dexamethasone  can increase her anxiety.      Advance Care Planning   - Prognostic awareness: N/A  - Relevant values/priorities: Reduction in anxiety, care for her son/daughter and herself and her family.    - Surrogate decision maker: Not addressed  - Resuscitation preferences: Unknown  -  AD: Deferred  - POLST: Deferred    - GOC convo deferred to future visit  - Today I spent 0 minutes with the patient specifically discussing ACP.     Time Spent  I spent a total of 40 minutes on this patient's care on the day of their visit excluding time spent related to any billed procedures. This time includes time spent with the patient as well as time spent documenting in the medical record, reviewing patient's records and tests, obtaining history, placing orders, communicating with other healthcare professionals, counseling the patient, family, or caregiver, and/or care coordination for the diagnoses above.    I performed this evaluation using real-time telehealth tools, including a live video Zoom connection between my location and the patient's location. Prior to initiating, the patient consented to perform this evaluation using telehealth tools.        Thank you for allowing us  to participate in the care of your patient. Please feel free to reach out with any questions, concerns or ideas about additional ways we could serve your patient & their family.    Follow-up: Jan 2026    Deward Pasco Schlatter, MD

## 2024-10-03 ENCOUNTER — Telehealth: Admit: 2024-10-04 | Discharge: 2024-10-04 | Payer: PRIVATE HEALTH INSURANCE | Attending: Transplant Hepatology

## 2024-10-03 DIAGNOSIS — D849 Immunodeficiency, unspecified: Secondary | ICD-10-CM

## 2024-10-03 NOTE — Progress Notes (Signed)
 Ariel Braun is a 63 y.o. female seen for an follow-up consultation at the request of Dr. Aurora for chronic liver disease restarting myeloma therapy.    I performed this evaluation using real-time telehealth tools, including a live video Zoom connection between my location and the patient's location. Prior to initiating, the patient consented to perform this evaluation using telehealth tools.    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 In 05/2022 during a hospitalization she had ongoing ascites requiring weekly LVP in the setting of protein caloric malnutrition s/p PEG-dependent on tube feeds. Hepatology was consulted for further management of her ascites, which was believed to be multifactorial but more driven by her malnutrition than her portal hypertension.     To review, history is notable for severe diarrhea secondary to norovirus treated with nitazoxanide  x 2) complicated by hypovolemic shock and acute liver injury in 12/02/2021, transferred to [place] with prolonged hospital course (1/25-01/15/2022). Her transjugular liver biopsy on 12/11/21 showed mild HVPG of 8 mmHg and steatohepatitis with pericentral/sinusoidal and periportal fibrosis, along with ductal reaction with focal pericholangitis, suggestive of drug-induced liver injury in the setting of underlying MASH with possible culprit including thalidomide derivatives (pomalidomide ) and dexamethasone  (which can exacerbation of pre-existing MASH). Flexible sigmoidoscopy on 12/17/2021 showed CMV colitis and was treated  with ganciclovir  followed by letemovir due to bone marrow suppression. 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 Her ascites fluid studies was notable for high SAAG >1.1 and low protein <1.5 suggestive of portal hypertension (mild elevated HVPG of 8 mmHg [WHVP 16 mmHg - FHVP 8 mmHg]on 12/11/21). She has no history of SBP. Her imaging with CTAP on 12/14/21 showed marked steatosis along with US  doppler on 12/11/21 showing steatosis with portal vein diameter of 15 mm. US  abdomen on 12/30/21 showed steatosis with normal surface contour. US  with doppler on 05/16/22 showed mild liver surface nodularity compatible with early cirrhosis with sequela of portal hypertension including moderate ascites and splenomegaly.  She was discharged to a SNF for an extended period of time.    She has been off tube feeds since 06/2022 and has been at home since 09/2022, no longer needing home services. Her diarrhea has completely resolved. Her ascites and edema have resolved. She stopped rifaximin  and ursodiol .    Interval History: She was last seen in hepatology clinic in 02/2024. Her liver enzymes have been normal. Remains on treatment for her MM.    Past Medical History:   Diagnosis Date    Acid reflux disease     Acute thromboembolism of deep veins of lower extremity (CMS code)     Two separate times- Blood clots in lungs and blood clot in left leg    Allergy Hereditary Angioedema    Diagnosed 1995    Anxiety 12/24    Anxiety/Panic attacks - dealing with a lot of Family medical situations    Cancer (CMS code) Multiple Myeloma    Diagnosed  09/2010    Colon polyp Discovered in Colonoscopy    Depression     Fissure, anal     Hereditary angioedema (CMS code)     Immune deficiency disorder     Multiple myeloma, without mention of having  achieved remission     IgG kappa    Myeloma (CMS code) 05/10/2011       Past Surgical History:   Procedure Laterality Date    AUTOLOGOUS STEM CELL TRANSPLANTATION  05/12/11    BREAST CYST EXCISION  1981    ENDO ADULT FLEX SIG WITH BIOPSY N/A 12/17/2021    Performed by Boby Kanaris, MD at [facility] - ENDOSCOPY OR - [address]    ENDO ADULT FLEX SIG WITH COAGULATION N/A 12/17/2021    Performed by Boby Kanaris, MD at [facility] - ENDOSCOPY OR - [address]       Current Outpatient Medications   Medication Sig Dispense Refill    acyclovir  (ZOVIRAX ) 400 mg tablet TAKE 1 TABLET (400 MG TOTAL) BY MOUTH IN THE MORNING AND IN THE EVENING 180 tablet 3    apixaban  (ELIQUIS ) 5 mg tablet Take 1 tablet (5 mg total) by mouth in the morning and 1 tablet (5 mg total) before bedtime.      azithromycin  (ZITHROMAX ) 500 mg tablet Take 1 tablet 1 hour prior to dental procedure 2 tablet 1    desvenlafaxine  succinate (PRISTIQ ) 25 mg 24 hr tablet TAKE 1 TABLET BY MOUTH EVERY DAY IN THE MORNING 90 tablet 1    dexAMETHasone  (DECADRON ) 4 mg tablet Take 2 tablets (8 mg total) by mouth every 7 (seven) days prior to Ixazomib 8 tablet 3    filgrastim -sndz (ZARXIO ) 300 mcg/0.5 mL injection syringe Inject 0.5 mL (300 mcg total) under the skin daily as needed (for anc<1) 6 mL 1    ixazomib (NINLARO ) 3 mg CAP Take 3 mg by mouth every 7 (seven) days TAKE EVERY & DAYS D1, D8 and D15 OF A 28 DAY CYCLE 27 capsule 0    ondansetron  (ZOFRAN ) 8 mg tablet Take 1 tablet (8 mg total) by mouth every 8 (eight) hours as needed for Nausea      pomalidomide  (POMALYST ) 2 mg capsule Take 1 capsule (2 mg total) by mouth nightly at bedtime 21 capsule 0    propranoloL  (INDERAL ) 10 mg tablet TAKE 0.5-1 TABLETS (5-10 MG TOTAL) BY MOUTH 3 (THREE) TIMES DAILY AS NEEDED (FOR PANIC ATTACKS. TAKE AT 1ST SIGN OF ATTACK) 90 tablet 1     No current facility-administered medications for this visit.       Allergies: She is allergic to filgrastim -aafi, diphenoxylate -atropine , and  caffeine.    Social History:   Social History     Socioeconomic History    Marital status: Married     Spouse name: Not on file    Number of children: Not on file    Years of education: Not on file    Highest education level: Not on file   Occupational History    Not on file   Tobacco Use    Smoking status: Never    Smokeless tobacco: Never   Substance and Sexual Activity    Alcohol use: Never    Drug use: Never    Sexual activity: Not Currently   Other Topics Concern    Not on file   Social History Narrative    Not on file     Social Drivers of Psychologist, [employer]  Strain: Low Risk (02/28/2023)    Overall Financial Resource Strain (CARDIA)     Difficulty of Paying Living Expenses: Not hard at all   Food Insecurity: No Food Insecurity (02/28/2023)    Hunger Vital Sign     Worried About Running Out of Food in the Last Year: Never true     Ran Out of Food in the Last Year: Never true   Transportation Needs: No Transportation Needs (02/28/2023)    PRAPARE - Therapist, Art (Medical): No     Lack of Transportation (Non-Medical): No   Housing Stability: Low Risk (02/28/2023)    Housing Stability Vital Sign     Unable to Pay for Housing in the Last Year: No     Number of Places Lived in the Last Year: 1     Unstable Housing in the Last Year: No       Family History: Her family history includes Colon cancer in her father and father; Diabetes in her mother and mother; Drug abuse in her brother and brother; High blood pressure in her mother; Hypertension in her brother, mother, and mother.  Family history is otherwise negative or as noted above.     Physical Exam:  Vital Signs: There were no vitals taken for this visit.  Constitutional: Patient appears well-developed and well-nourished. Pleasant and appropriately interactive.   Eyes: Anicteric sclera  Pulmonary/Chest: Effort normal. No respiratory distress. No cough.  Neurological: Alert and oriented to person, place, and time.  Skin: No rash.      Records Review: I have personally reviewed the patient's medical records. These are summarized within the history of present illness.    Labs: I have personally reviewed and interpreted the following laboratory studies:    CIRRHOSIS MANAGEMENT          Latest Ref Rng & Units 04/12/2024    09:10 05/10/2024    10:07 06/08/2024    08:55 07/06/2024    11:09 08/06/2024    08:25 08/30/2024    08:49 09/07/2024    09:42 09/25/2024    09:42   Labs   WBC Count 3.4 - 10.8 x10E3/uL 2.4  2.3  2.5  2.0  1.8  1.9  2.7  1.8    RBC Count 3.77 - 5.28 x10E6/uL 4.19  4.31  4.36  3.86  4.13  4.22  4.28  3.90    Hemoglobin 11.1 - 15.9 g/dL 88.0  87.4  87.4  88.8  11.9  12.2  12.5  11.5    Hematocrit 34.0 - 46.6 % 37.4  38.8  39.3  35.0  37.5  37.7  38.3  35.1    MCV 79 - 97 fL 89  90  90  91  91  89  90  90    MCH 26.6 - 33.0 pg 28.4  29.0  28.7  28.8  28.8  28.9  29.2  29.5    MCHC 31.5 - 35.7 g/dL 68.1  67.7  68.1  68.2  31.7  32.4  32.6  32.8    Platelet Count 150 - 450 x10E3/uL 195  179  175  168  155  161  162  141    Sodium 134 - 144 mmol/L 143  142  143  142  142  142  143  144    Creatinine 0.57 - 1.00 mg/dL 9.44  9.45  9.43  9.48  0.57  0.53  0.55  0.52    Bilirubin,  Total 0.0 - 1.2 mg/dL 0.8  1.1  0.8  0.9  0.9  1.1  0.9  1.0    Albumin , Serum / Plasma 3.9 - 4.9 g/dL 4.0  4.1  4.2  4.0  4.1  4.3  4.1  4.0          09/27/2024    08:07 09/28/2024    10:32   Labs   WBC Count 3.2  5.2    RBC Count 4.15  4.42    Hemoglobin 12.1  12.9    Hematocrit 37.4  39.6    MCV 90  90    MCH 29.2  29.2    MCHC 32.4  32.6    Platelet Count 152  155    Sodium 144  142    Creatinine 0.52  0.56    Bilirubin, Total 1.1  1.4    Albumin , Serum / Plasma 4.0  4.1      .AMB SYNOPSIS NAFLD MANAGEMENT         Latest Ref Rng & Units  04/12/2024    09:10  05/10/2024    10:07  06/08/2024    08:55  07/06/2024    11:09  08/06/2024    08:25  08/30/2024    08:49  09/07/2024    09:42  09/25/2024    09:42  Labs  WBC Count  3.4 - 10.8 x10E3/uL  2.4   2.3     2.5   2.0    1.8   1.9   2.7   1.8   RBC Count  3.77 - 5.28 x10E6/uL  4.19   4.31   4.36   3.86   4.13   4.22   4.28   3.90   Hemoglobin  11.1 - 15.9 g/dL  88.0   87.4   87.4   88.8   11.9   12.2   12.5   11.5   Hematocrit  34.0 - 46.6 %  37.4   38.8   39.3   35.0   37.5   37.7   38.3   35.1   MCV  79 - 97 fL  89   90   90   91   91   89   90   90   MCH  26.6 - 33.0 pg  28.4   29.0   28.7   28.8   28.8   28.9   29.2   29.5   MCHC  31.5 - 35.7 g/dL  68.1   67.7   68.1   68.2   31.7   32.4   32.6   32.8   Platelet Count  150 - 450 x10E3/uL  195   179   175   168   155   161   162   141   Creatinine  0.57 - 1.00 mg/dL  9.44   9.45   9.43   9.48   0.57   0.53   0.55   0.52   AST  15 - 59 IU/L  21   20   20   21   26   19   25   16    ALT  0 - 35 IU/L  15   20   18   20   20   19   31   16    Bilirubin, Total  0.0 - 1.2 mg/dL  0.8   1.1   0.8   0.9   0.9  1.1   0.9   1.0   Alkaline Phosphatase  49 - 135 IU/L  99   99   111   90   97   89   90   82   Albumin , Serum / Plasma  3.9 - 4.9 g/dL  4.0   4.1   4.2   4.0   4.1   4.3   4.1   4.0           09/27/2024    08:07  09/28/2024    10:32  Labs  WBC Count  3.2   5.2   RBC Count  4.15   4.42   Hemoglobin  12.1   12.9   Hematocrit  37.4   39.6   MCV  90   90   MCH  29.2   29.2   MCHC  32.4   32.6   Platelet Count  152   155   Creatinine  0.52   0.56     AST  18   20   ALT  16   17   Bilirubin, Total  1.1   1.4     Alkaline Phosphatase  88   95   Albumin , Serum / Plasma  4.0   4.1      Serologies:  Hep A IgG reactive (12/11/2021)  HBsAg negative (12/11/2021)  Anti-HBs positive (12/11/2021)  Anti-HBc negative (12/11/2021)  HIV negative (12/15/2021)  HCV Ab negative (02/12/2011), negative (12/11/2021)    Studies: I have reviewed the following studies:    PET CT 01/18/2023: Impression:  -Stable appearance of mixed lytic and sclerotic lesions within the thoracolumbar spine and pelvis. No evidence for interval hypermetabolic skeletal pathology  -Stable nodular thyroid with interval decrease in metabolic  activity  -Inflammatory changes within the lower lobes of both lungs     05/16/2022: IMPRESSION:   1.  Mild liver surface nodularity compatible with early cirrhosis and/or fibrosis. Patent vasculature with normal direction of flow.  2.  Sequela of portal hypertension including increased now moderate ascites and splenomegaly.  3.  Cholelithiasis.    Liver biopsy 12/11/2021: FINAL PATHOLOGIC DIAGNOSIS   Liver, transjugular biopsy:  1.  Steatohepatitis with pericentral/sinusoidal and periportal fibrosis; see comment.  2.  Ductular reaction with focal pericholangitis; see comment.  COMMENT:  The biopsy shows severe steatosis with pericentral/sinusoidal and  periportal fibrosis, histiocytic and neutrophilic lobular inflammation,  Mallory hyaline as well as rare ballooned hepatocytes. 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  Clinical correlation with steatohepatitis risk factors is suggested,  including for any history of massive weight loss which may lead to an  aggressive onset of steatohepatitis [1].  The history of dexamethasone   use is noted; corticosteroid use has been associated with exacerbation  of pre-existing fatty liver disease [2].     Additionally, there is ductular reaction with focal pericholangitis,  raising consideration for a duct obstructive process. Clinical  correlation is advised. The concern for other drug-induced liver injury  is noted. In particular, thalidomide derivatives (such as pomalidomide )  have been reported in association with vanishing bile duct syndrome  (VBDS) [3]; however, there is no convincing duct loss to suggest VBDS.  There are no clusters of plasma cells seen to suggest involvement by  myeloma.    Microscopic findings  Adequacy: 9 portal tracts.   Portal tracts: Mild portal inflammation with histiocytes and lymphocytes, no interface activity, mild ductular reaction with focal pericholangitis. Interlobular bile  ducts show mild duct  injury without  overt duct loss.   Hepatic parenchyma: Mild lobular inflammation with histiocytes and neutrophils, rare spotty hepatocellular injury/necrosis, severe steatosis with neutrophilic satellitosis (grade 3, scale 0-3), rare  hepatocellular ballooning, extensive Mallory hyaline deposition, rare cholestasis, no granulomas, no viropathic changes, and no sinusoidal changes.    Immunohistochemical and histochemical stains (block A1):  - Trichrome:      Highlights mild to moderate pericellular/sinusoidal fibrosis and periportal fibrosis (stage 2, scale 0-4,NASH-CRN method).  - Iron :  1+ focal staining in scattered Kupffer cells.  - PASD:  No cytoplasmic globules typical of alpha-1-antitrypsin deficiency.  - CK8/18: Highlights Mallory hyaline within hepatocytes.  - CK7: Highlights mild ductular reaction and intact interlobular bile ducts. No periportal hepatocyte staining to support chronic cholestatic effect.  - CD68: Highlights scattered portal and lobular macrophages. No staining in hepatocytes with Mallory hyaline (indicates that these cells are not macrophages).  - [place] Red:     Negative, no support for hepatic amyloidosis.     ASSESSMENT AND PLAN:  MICHELLA DETJEN is a 64 y.o. 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    Chronic liver disease: Stage 2 fibrosis with Steatohepatitis and Ductular reaction with focal pericholangitis on liver biopsy in 11/2021. Her liver enzymes have normalized and ascites resolved with improvement in nutrition/albumin .  Agree with holding the rifaximin  and the ursodiol .     Multiple myeloma: Planning to start Ixazomib, Cytoxan, Medrol . Reviewed in [facility] liver tox:  Ixazomib: In large clinical trials of ixazomib combined with lenalidomide  and dexamethasone , elevations in serum aminotransferase levels were common, occurring in ~10% of patients. However, values greater than 5 times the upper limit of normal (ULN) were rare, occurring in <1% of recipients. Cases of clinically apparent liver injury including acute liver failure have been reported in patients receiving ixazomib, however in many instances multiple concomitant medications were being taken and the specific role of ixazomib in causing the liver injury was not always clear. The clinical features of cases of liver injury attributed to ixazomib have not been defined in any detail. Hepatotoxicity is listed as a warning in the product label for ixazomib and monitoring of serum enzymes during treatment is recommended.  Cyclophosphamide: Mild and transient elevations in serum aminotransferase levels are found in up to 43% of patients with cancer who are treated with cyclophosphamide. The abnormalities are generally asymptomatic and transient and do not require dose modification. Enzyme elevations are more common with higher doses and with intravenous therapy. In some instances, marked elevations arise warranting dose modification or discontinuation of cyclophosphamide. Clinically apparent liver injury from standard doses of cyclophosphamide is uncommon, but several case reports of acute liver injury with jaundice have been published. The onset is within 2 to 8 weeks of starting cyclophosphamide and the pattern of serum enzyme elevations is hepatocellular. Immunoallergic and autoimmune features are uncommon. The injury in most cases is self-limited and resolves within 1 to 3 months of stopping; however, fatal instances have been reported. Recurrence on reexposure has been  described.  Medrol : Corticosteroid therapy can cause hepatic steatosis and hepatic  enlargement, but this is often not clinically apparent, particularly in adults. This effect can occur quite rapidly and is rapidly reversed with discontinuation. High doses and long term use has been associated with the development or exacerbation of nonalcoholic steatohepatitis with elevations in serum aminotransferase levels and liver histology resembling alcoholic hepatitis with steatosis, chronic inflammation, centrolobular ballooning degeneration and Mallory bodies. However, symptomatic or progressive liver injury from corticosteroid induced steatohepatitis is uncommon. Furthermore, corticosteroids may act to worsen an underlying nonalcoholic fatty liver disease rather than causing the condition de novo. The worsening may be due to direct effects of glucocorticoids on insulin resistance or fatty acid metabolism or may be the result of weight gain which is common with long term corticosteroid therapy. While simple steatosis induced by corticosteroids is rapidly reversible, steatohepatitis can be slow to resolve upon withdrawal of corticosteroids. She has now started treatment and reassuringly liver tests remain normal    Surveillance for hepatocellular carcinoma: Liver biopsy in 11/2021 showed F2 fibrosis, but imaging in July 2023 concerning for advanced fibrosis (complicated by multifactorial ascites). Discussed with patient pros/cons of monitoring for [facility]- she would like to do surveillance but whether she gets the U/S depends on how things are going with her family's multiple medical crises. Will re-send U/S order.    Immunity: She is hepatitis A and B immune. Anti-HBc negative. Anti-HBs titer was low, so I recommend repeat and HBV booster if Ab <12    Follow-up: 9 months       I spent a total of 40 minutes on this patient's care on the day of their visit excluding time spent related to any billed procedures. This time includes  time spent with the patient as well as time spent documenting in the medical record, reviewing patient's records and tests, obtaining history, placing orders, communicating with other healthcare professionals, counseling the patient, family, or caregiver, and/or care coordination for the diagnoses above.

## 2024-10-05 LAB — PROTEIN ELECTROPHORESIS, SERUM
A/G Ratio: 1.3 (ref 0.7–1.7)
Albumin, sPEP: 3.4 g/dL (ref 2.9–4.4)
Alpha 1 Globulin: 0.3 g/dL (ref 0.0–0.4)
Alpha 2 Globulin: 0.7 g/dL (ref 0.4–1.0)
Beta Globulin: 1 g/dL (ref 0.7–1.3)
Gamma Globulin: 0.7 g/dL (ref 0.4–1.8)
Globulin, Total: 2.7 g/dL (ref 2.2–3.9)
M-Protein (monoclonal), Serum: 0.6 g/dL — ABNORMAL HIGH
Protein, Total, Serum / Plasma: 6.1 g/dL (ref 6.0–8.5)

## 2024-10-05 LAB — IMMUNOFIXATION ELECTROPHORESIS, SERUM
IgA, Serum: 72 mg/dL — ABNORMAL LOW (ref 87–352)
IgG: 892 mg/dL (ref 586–1602)
IgM, Serum: 26 mg/dL (ref 26–217)

## 2024-10-05 LAB — KAPPA AND LAMBDA FREE LIGHT CHAINS, SERUM
Kappa Light Chain, Serum, Free: 105 mg/L — ABNORMAL HIGH (ref 3.3–19.4)
Kappa/Lambda Ratio, Serum, Free: 8.27 — ABNORMAL HIGH (ref 0.26–1.65)
Lambda Light Chain, Serum, Free: 12.7 mg/L (ref 5.7–26.3)

## 2024-10-05 LAB — CYTOMEGALOVIRUS DNA, QUANTITATIVE PCR, PLASMA: Cytomegalovirus DNA, Quantitative PCR: NEGATIVE [IU]/mL

## 2024-10-19 MED ORDER — POMALYST 2 MG CAPSULE
2 | ORAL_CAPSULE | Freq: Every day | ORAL | 0 refills | Status: DC
Start: 2024-10-19 — End: 2024-11-18

## 2024-10-23 LAB — COMPREHENSIVE METABOLIC PANEL
AST: 18 IU/L (ref 15–59)
Alanine transaminase: 17 IU/L (ref 0–35)
Albumin, Serum / Plasma: 4.2 g/dL (ref 3.9–4.9)
Alkaline Phosphatase: 87 IU/L (ref 49–135)
BUN/Creatinine Ratio (External Lab): 31 — ABNORMAL HIGH (ref 12–28)
Bilirubin, Total: 1.2 mg/dL (ref 0.0–1.2)
Calcium, total, Serum / Plasma: 9.7 mg/dL (ref 8.7–10.3)
Carbon Dioxide, Total: 25 mmol/L (ref 20–29)
Chloride, Serum / Plasma: 105 mmol/L (ref 96–106)
Creatinine: 0.54 mg/dL — ABNORMAL LOW (ref 0.57–1.00)
Globulin, Total: 2.3 g/dL (ref 1.5–4.5)
Glucose, non-fasting: 79 mg/dL (ref 70–99)
Potassium, Serum / Plasma: 3.9 mmol/L (ref 3.5–5.2)
Protein, Total, Serum / Plasma: 6.5 g/dL (ref 6.0–8.5)
Sodium, Serum / Plasma: 142 mmol/L (ref 134–144)
Urea Nitrogen, serum/plasma: 17 mg/dL (ref 8–27)
eGFRcr: 103 mL/min/1.73 (ref >59)

## 2024-10-23 LAB — COMPLETE BLOOD COUNT WITH DIFFERENTIAL
% Basophils: 0 %
% Eosinophils: 2 %
% Lymphocytes: 62 %
% Monocytes: 12 %
% Neutrophils: 24 %
Abs Basophils: 0 x10E3/uL (ref 0.0–0.2)
Abs Eosinophils: 0 x10E3/uL (ref 0.0–0.4)
Abs Lymphocytes: 1.4 x10E3/uL (ref 0.7–3.1)
Abs Monocytes: 0.3 x10E3/uL (ref 0.1–0.9)
Abs Neutrophils: 0.5 x10E3/uL — ABNORMAL LOW (ref 1.4–7.0)
Hematocrit: 38 % (ref 34.0–46.6)
Hemoglobin: 12.1 g/dL (ref 11.1–15.9)
MCH: 28.7 pg (ref 26.6–33.0)
MCHC: 31.8 g/dL (ref 31.5–35.7)
MCV: 90 fL (ref 79–97)
Platelet Count: 153 x10E3/uL (ref 150–450)
RBC Count: 4.22 x10E6/uL (ref 3.77–5.28)
RDW-CV: 17.1 % — ABNORMAL HIGH (ref 11.7–15.4)
WBC Count: 2.2 x10E3/uL — ABNORMAL LOW (ref 3.4–10.8)

## 2024-10-23 NOTE — Telephone Encounter (Signed)
 Called patient to provide the following instructions per Inocente, NP:  -Postpone dental extractions until [facility] confirmed to be >1  -Hold eliquis  24 hours prior to procedure, can resume that evening after procedure if no significant bleeding    Patient verbalized understanding. Confirmed she self-injected Zarxio  last night and will plan to have CBC re-checked prior to procedure scheduling

## 2024-10-25 LAB — COMPREHENSIVE METABOLIC PANEL
AST: 19 IU/L (ref 15–59)
Alanine transaminase: 18 IU/L (ref 0–35)
Albumin, Serum / Plasma: 4.2 g/dL (ref 3.9–4.9)
Alkaline Phosphatase: 93 IU/L (ref 49–135)
BUN/Creatinine Ratio (External Lab): 28 (ref 12–28)
Bilirubin, Total: 1.1 mg/dL (ref 0.0–1.2)
Calcium, total, Serum / Plasma: 9.4 mg/dL (ref 8.7–10.3)
Carbon Dioxide, Total: 25 mmol/L (ref 20–29)
Chloride, Serum / Plasma: 107 mmol/L — ABNORMAL HIGH (ref 96–106)
Creatinine: 0.57 mg/dL (ref 0.57–1.00)
Globulin, Total: 2.2 g/dL (ref 1.5–4.5)
Glucose, non-fasting: 86 mg/dL (ref 70–99)
Potassium, Serum / Plasma: 4.2 mmol/L (ref 3.5–5.2)
Protein, Total, Serum / Plasma: 6.4 g/dL (ref 6.0–8.5)
Sodium, Serum / Plasma: 143 mmol/L (ref 134–144)
Urea Nitrogen, serum/plasma: 16 mg/dL (ref 8–27)
eGFRcr: 101 mL/min/1.73 (ref >59)

## 2024-10-25 LAB — COMPLETE BLOOD COUNT WITH DIFFERENTIAL
% Basophils: 1 %
% Eosinophils: 4 %
% Immature Granulocytes: 1 %
% Lymphocytes: 34 %
% Monocytes: 15 %
% Neutrophils: 45 %
Abs Basophils: 0 x10E3/uL (ref 0.0–0.2)
Abs Eosinophils: 0.1 x10E3/uL (ref 0.0–0.4)
Abs Imm Granulocytes: 0 x10E3/uL (ref 0.0–0.1)
Abs Lymphocytes: 1.2 x10E3/uL (ref 0.7–3.1)
Abs Monocytes: 0.5 x10E3/uL (ref 0.1–0.9)
Abs Neutrophils: 1.5 x10E3/uL (ref 1.4–7.0)
Hematocrit: 39.6 % (ref 34.0–46.6)
Hemoglobin: 12.3 g/dL (ref 11.1–15.9)
MCH: 28.7 pg (ref 26.6–33.0)
MCHC: 31.1 g/dL — ABNORMAL LOW (ref 31.5–35.7)
MCV: 93 fL (ref 79–97)
Platelet Count: 167 x10E3/uL (ref 150–450)
RBC Count: 4.28 x10E6/uL (ref 3.77–5.28)
RDW-CV: 17 % — ABNORMAL HIGH (ref 11.7–15.4)
WBC Count: 3.4 x10E3/uL (ref 3.4–10.8)

## 2024-10-26 LAB — LACTATE DEHYDROGENASE, BLOOD: LDH, serum/plasma: 139 IU/L (ref 119–226)

## 2024-10-28 LAB — IMMUNOFIXATION ELECTROPHORESIS, SERUM
IgA, Serum: 68 mg/dL — ABNORMAL LOW (ref 87–352)
IgG: 975 mg/dL (ref 586–1602)
IgM, Serum: 27 mg/dL (ref 26–217)

## 2024-10-28 LAB — PROTEIN ELECTROPHORESIS, SERUM
A/G Ratio: 1.2 (ref 0.7–1.7)
Albumin, sPEP: 3.6 g/dL (ref 2.9–4.4)
Alpha 1 Globulin: 0.3 g/dL (ref 0.0–0.4)
Alpha 2 Globulin: 0.7 g/dL (ref 0.4–1.0)
Beta Globulin: 1.1 g/dL (ref 0.7–1.3)
Gamma Globulin: 0.8 g/dL (ref 0.4–1.8)
Globulin, Total: 2.9 g/dL (ref 2.2–3.9)
M-Protein (monoclonal), Serum: 0.6 g/dL — ABNORMAL HIGH
Protein, Total, Serum / Plasma: 6.5 g/dL (ref 6.0–8.5)

## 2024-10-28 LAB — KAPPA AND LAMBDA FREE LIGHT CHAINS, SERUM
Kappa Light Chain, Serum, Free: 113.1 mg/L — ABNORMAL HIGH (ref 3.3–19.4)
Kappa/Lambda Ratio, Serum, Free: 10.88 — ABNORMAL HIGH (ref 0.26–1.65)
Lambda Light Chain, Serum, Free: 10.4 mg/L (ref 5.7–26.3)

## 2024-10-28 LAB — CYTOMEGALOVIRUS DNA, QUANTITATIVE PCR, PLASMA: Cytomegalovirus DNA, Quantitative PCR: NEGATIVE [IU]/mL

## 2024-11-01 ENCOUNTER — Telehealth: Admit: 2024-11-01 | Discharge: 2024-11-02 | Payer: PRIVATE HEALTH INSURANCE | Attending: Physician

## 2024-11-01 DIAGNOSIS — D849 Immunodeficiency, unspecified: Secondary | ICD-10-CM

## 2024-11-01 NOTE — Patient Instructions (Signed)
 Continue IPd  Could consider a biphosphonate upon progression.

## 2024-11-01 NOTE — Progress Notes (Signed)
 Ariel Braun  [id number]    Date of Service: 11/01/2024    Reason for visit / Chief Complaints:    Subjective   Ariel Braun is a 63 y.o. female with MM who is on VIDEO follow-up    I performed this consultation using real-time Telehealth tools, including a live video connection between my location and the patient's location. Prior to initiating the consultation, I obtained informed verbal consent to perform this consultation using Telehealth tools and answered all the questions about the Telehealth interaction. Patient in  .    Events since the last visit: The patient has been ON IPd therapy. Tolerating OK. Does have occ gi symptoms after ixa. Will have 2 teeth removed in January 6th.  Will check counts just before. Has been having occ anxiety attacks - takes propranolol . Has significant family stressors at this time.  Some pain areas. No "hot spots" or new bone pain. No significant signs of progressive MM. No kidney/urinary difficuties. No f/chills, URI symptoms, sinus congestion, sore throat or other infectious symptoms. Has been sheltering. No N/V/dysuria/diarrhea.  No CP, palp.  No bleeding or bruising.    Oncology history:  DX:   IgG kappa myeloma, diagnosed 09/07/10   - BMBx: 8% plasma cells, normal cyto 46XX, FISH showed only 3 copies 1q21, Stage 2 ISS (3.88 beta 2 microglobulin),IgG 4750, M-spike 3.2, kappa 586.9 mg/L.     TREATMENT  1. LINE1: RVD 10/2010 through April 2011 (? 4 cycles). Achieved VGPR with M-spike 0.4, IgG 886, kappa 27.6.   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 M-spike 0.6, IgG 1270.     3. LINE2: 08/05/14: Started Rev 15 mg 3/4. M-spike 1, IgG 1640, kappa 62.2 - SD    Biochemical PD 04/17/19: M-1.4, KLC 96.3. ? PD. Up Rev to 15 mg qd (3/4) and dex 8 mg wkly(4/4).       07/20/19: L hip pain. M-1.2, KLC 96. Cont. Rev 15 mg (3/4)/dex 8 mg (4/4).     LINE4: Pom 3, Dara, Dex 8 (3/4).      Severe infection-sepsis 12/22  - all meds stopped. Admit [facility] 11/2021-01/2022  - CMV colitis, PE, Cholecystitis.  - Hospitalized in [facility] thru 6/2  - Hospitalized [facility] 6/14 - 7/13 - colitis/Noro    LINE5: Ixa/Pom/dex Cycle #1 07/2023       Past Medical History:   Diagnosis Date    Acid reflux disease     Acute thromboembolism of deep veins of lower extremity (CMS code)     Two separate times- Blood clots in lungs and blood clot in left leg    Allergy Hereditary Angioedema    Diagnosed 1995    Anxiety 12/24    Anxiety/Panic attacks - dealing with a lot of Family medical situations    Cancer (CMS code) Multiple Myeloma    Diagnosed 09/2010    Colon polyp Discovered in Colonoscopy    Depression     Fissure, anal     Hereditary angioedema (CMS code)     Immune deficiency disorder     Multiple myeloma, without mention of having achieved remission     IgG kappa    Myeloma (CMS code) 05/10/2011     Past medical, family and social histories, as well as medications and allergies, were reviewed and updated in the medical record as appropriate.    Current Outpatient Medications   Medication Sig Dispense Refill  acyclovir  (ZOVIRAX ) 400 mg tablet TAKE 1 TABLET (400 MG TOTAL) BY MOUTH IN THE MORNING AND IN THE EVENING 180 tablet 3    apixaban  (ELIQUIS ) 5 mg tablet Take 1 tablet (5 mg total) by mouth in the morning and 1 tablet (5 mg total) before bedtime.      azithromycin  (ZITHROMAX ) 500 mg tablet Take 1 tablet 1 hour prior to dental procedure 2 tablet 1    desvenlafaxine  succinate (PRISTIQ ) 25 mg 24 hr tablet TAKE 1 TABLET BY MOUTH EVERY DAY IN THE MORNING 90 tablet 1    dexAMETHasone  (DECADRON ) 4 mg tablet Take 2 tablets (8 mg total) by mouth every 7 (seven) days prior to Ixazomib 8 tablet 3    filgrastim -sndz (ZARXIO ) 300 mcg/0.5 mL injection syringe Inject 0.5 mL (300 mcg total) under the skin daily as needed (for anc<1) 6 mL 1    ixazomib (NINLARO ) 3 mg CAP Take 3 mg by mouth every 7 (seven) days TAKE EVERY & DAYS D1, D8 and D15 OF A 28 DAY CYCLE 27  capsule 0    ondansetron  (ZOFRAN ) 8 mg tablet Take 1 tablet (8 mg total) by mouth every 8 (eight) hours as needed for Nausea      pomalidomide  (POMALYST ) 2 mg capsule Take 1 capsule (2 mg total) by mouth nightly at bedtime 21 capsule 0    propranoloL  (INDERAL ) 10 mg tablet TAKE 0.5-1 TABLETS (5-10 MG TOTAL) BY MOUTH 3 (THREE) TIMES DAILY AS NEEDED (FOR PANIC ATTACKS. TAKE AT 1ST SIGN OF ATTACK) 90 tablet 1     No current facility-administered medications for this visit.       ALLERGIES  Allergies/Contraindications   Allergen Reactions    Filgrastim -Aafi Other (See Comments)     Severe pain in chest and ribs - lasted 2 weeks    Diphenoxylate -Atropine  Abdominal Pain     Severe stomach pain     Caffeine Anxiety     "Jitters"       PHYSICAL EXAM- LIMIT EXAM BY VIDEO  Objective: ECOG1  Constitutional:  Pleasant over video  HENT:  Normocephalic, Neck- Supple.   SKIN: No rash noted Eyes:  PERRL, EOMI, sclera appear clear  Neurologic:  Alert & interactive, nl affect, No focal deficits noted.       RESULTS: Labs in [facility] were reviewed. Lab results reviewed and interpretation provided to patient  Most recent relevant labs   Component Value Date    WBC Count 3.4 10/25/2024    Hemoglobin 12.3 10/25/2024    Hematocrit 39.6 10/25/2024    MCV 93 10/25/2024    Platelet Count 167 10/25/2024     Most recent relevant labs   Component Value Date    Creatinine 0.57 10/25/2024    Creatinine (POCT) 0.8 06/05/2014         Outside Labs:    No results found.  ASSESSMENT & PLAN  1. Multiple Myeloma: Impression: established - stable - on therapy - requiring disease monitoring  This patient is on therapy and at high-risk for side effects and progression and requiring intensive monitoring. The patient's therapy is Ixaz(3mg ) P (2mg ) d(4 mg)     We reviewed the labs: SIFE, SPEP, SFLC, CBC => Hgb 12.3, plat 167, Cr 0.57, IgG-kappa, m-prot 0.6, KLC 113, LLC10.4.  No evidence of progression.     We discussed that he disease is relatively stable - no  changes to medications needed.  Continue labs every 4 weeks. Follow-up in 4 weeks with [facility] and 3 months  [facility].     The patient has a high complexity problem: multiple myeloma, which is an incurable malignancy and the patient is receiving active therapy, which have side effects and poses a threat to life and bodily function.    Regarding data complexity, I have reviewed the following tests => SPEP, SIFE, SFLCs, CBC and CMP.      Medical Decision Making/ Plan: - Continue on IPD therapy   - Continue to follow myeloma protein levels every 4-6 weeks  - Follow-up in 2-3 momths    2. Immunocompromised state/Infectious Disease Concerns: Impression: established - immunocompromised from disease/treatment:  The patient is immunocompromised and is at high-risk from infection due to therapy. Needs to be up-to-date on Covid vaccine. Has been avoiding exposure. No recent exposures.    Requiring close follow-up for symptoms.  At risk for VZV reactivation and is on ACV. No current signs of infection. Will call with new signs of infection        Plan: - Continue prophylaxis - no signs of CMV or other viruses.  - yearly flu vaccine  - Avoid Covid exposure  - The patient will call with any symptoms or signs of infection    3. Depression/Psychologic/Social Stressors: Impression: established - stable  The patient has cancer and has situation depression and is at risk for anxiety. Needs close follow-up    Currently, the attitude is good. There are no signs of anxiety or active depression. Mood appropriate, understands plan      Plan: No additional medications needed    4. Anemia: Impression: established - stable    The blood counts are good, follow. Next CBC in 1-2 weeks locally       Plan: No transfusions or cytokines needed today.    5. Neutropenia/Thrombocytopenia: Impression: established - stable    The treatment has toxicity on the bone marrow - no current signs of suppression    Plan: No cytokines or transfusions    6. RI/  Electrolyte Replacement: Impression: established - stable     Taking ample fluids and avoiding nephrotoxins.  Lytes & Cr Okay, appears euvolemic. Appetite good and weight unchanged. No new medications needed.     Plan: Continue adequate fluid intake & balanced, nutritional diet   - Continue as much exercise as possible.  - No additional supplementation needed.    7. Osteopenia: Impression: established - stable:    The patient is at risk for fracture due to MM. We recommend Calcium  and Vit D replacement. We also recommend an aggressive exercise program.    Plan: - No Biphosphonates    8. Secondary Cancer Risk: impression: established - NEEDS close follow-up  The patient has high-risk of secondary malignancies. Needs close follow-up and routine cancer screening.    Plan: - Derm follow-up at least yearly recommended  - Colonoscopy every 5 years  - Screening per local team

## 2024-11-18 MED ORDER — POMALYST 2 MG CAPSULE
2 | ORAL_CAPSULE | Freq: Every day | ORAL | 0 refills | 28.00000 days | Status: DC
Start: 2024-11-18 — End: 2024-12-17

## 2024-11-20 ENCOUNTER — Telehealth: Admit: 2024-11-20 | Discharge: 2024-11-20 | Payer: PRIVATE HEALTH INSURANCE

## 2024-11-20 DIAGNOSIS — F410 Panic disorder [episodic paroxysmal anxiety] without agoraphobia: Secondary | ICD-10-CM

## 2024-11-20 MED ORDER — DESVENLAFAXINE SUCCINATE ER 25 MG TABLET,EXTENDED RELEASE 24 HR
25 | ORAL_TABLET | Freq: Every morning | ORAL | 3 refills | Status: AC
Start: 2024-11-20 — End: ?

## 2024-11-20 NOTE — Progress Notes (Signed)
 Subjective    Ariel Braun is a 64 y.o. female with multiple myeloma (diagnosis category: Multiple myeloma (including amyloidosis)  metastatic) who is being seen via video visit while patient is at home at the request of Ariel Braun of (referral category: Oncology/Rad Onc/Onc Surgery) for symptom management.    Medical team: PCP Ariel Braun, oncologist Ariel Braun.  Hepatologist Ariel Braun.    Primary caregiver(s):  Ariel Braun  Palliative care team member disciplines: Physician    HPI  Ariel Braun is a 64 y.o. female with anxiety and mental stressors.    Today patient/family reports:     -doing OK    -good holidays.      -contines to have weekly panic attacks.  As soon as she feels it, takes that propanolol 10mg  and then takes another 10mg  15-30 mins later.  The 2nd dose helps.  Asks about a 3rd dose.    -pristiq  25mg  qAM.  Refill needed    -has BP cuff at home.  Not suyre what baseline BP or pulse is.     -Braun still having a lot of health problems without explanation.  Blood draws really wipe Ariel out so reluctant to have blood drawn.      Ariel Braun loves to crochet and does not think about anything when she does.  Open to joining a group.      -says myeloma numbers stable    Social:  - She is fluent in Portuguese and speaks with Ariel mother. Also speaks Spanish. Has been dealing with Ariel mother's illness since she was a child and was translating for Ariel as Ariel mother speaks Portuguese.  States that Ariel mother was a first engineer, drilling Portuguese speaker here. Braun understands but does not speak much Portuguese.    Social history most significant for:  Lives with husband, mom and son, who has intellectual disability but can take care of himself.  Isolated.  Cares for son along with help from Braun, whom she is close to.  Braun also lives with Ariel.    She reports she is in a caregiver role in Ariel family.    Husband has diabetes  - Has recently had Ariel Braun's birthday celebration  and enjoyed it.    Spiritual:  - Born and raised Catholic but tends to gain more spiritual information and guidance from principles that are Christian-based and Bible-based and applies those principles and traditions in real life.  She no longer identifies as Ariel Braun.    - She and Ariel Braun watch a lot of Ariel Braun programs that are uplifting and feels that it provides positive direction and helps Ariel focus on good things in Ariel life. Prays and reads the Bible and states that this helps.    Fam hx: Strong fam hx of addiction.  Father was alcoholic.  Mom used valium to cope.  Brothers with drug and alcohol addiction.      Patient/Family screened for spiritual care needs: Yes  Patient/Family screened for psychosocial needs: Yes    SMS Well-Being Survey      06/26/2023     9:37 PM 08/03/2023     9:49 AM 01/23/2024     4:14 PM 10/02/2024     2:14 PM 11/20/2024    12:44 PM   SMS Well-Being Scores   Pain  8 0 0 0   Shortness of breath  0 0 0 0   Constipation 8 8 3 5  0   Tiredness  5 7  7 5   Nausea  0 0 0 0   Depression  3 0 0 0   Anxiety 7 7 8 8 8    Drowsiness  7  5 0   Appetite 0 9 0 0 0   Feeling of wellbeing 5 7 3 7 3    Other problem Sometimes feel shakiness (inside feeling not trimmers)  At times feel like it's too much med.       "I feel at peace." A moderate amount A moderate amount A moderate amount A moderate amount A moderate amount   "At times I worry I will be a burden to my family." A moderate amount A moderate amount Not at all A moderate amount    How would you rate your overall quality of life? Good Metta Metta Metta Metta   DPOA I have an [employer] Directive Health Care Directive      No DPOA    I am no sure what a "DPOA" is I am no sure what a "DPOA" is              Objective    Physical Exam:   NAD  Sitting on couch  Normal neck ROM  Normal resp effort    Performance status (by Palliative Performance Scale): 70% - Reduced ambulation, Unable Normal Job/Work, Significant disease, Occasional  assistance necessary, Normal or reduced intake, Full conscious level      Review of Prior Testing  I have personally reviewed and interpreted the following studies:  Most recent relevant labs   Component Value Date    WBC Count 2.0 (L) 11/20/2024    Hemoglobin 11.7 11/20/2024    Hematocrit 36.0 11/20/2024    MCV 89 11/20/2024    Platelet Count 149 (L) 11/20/2024     Most recent relevant labs   Component Value Date    Sodium, Serum / Plasma 143 11/20/2024    Potassium, Serum / Plasma 4.0 11/20/2024    Urea Nitrogen, serum/plasma 19 11/20/2024    Creatinine 0.56 (L) 11/20/2024     Most recent relevant labs   Component Value Date    Thyroid Stimulating Hormone 3.98 12/15/2021     No results found.    #  Cancer stage at diagnosis- see onc note for staging.      Assessment and Plan    Ariel Braun is a 64 y.o. 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      Chronic anxiety/panic attacks: Getting weekly, esp if stressor.  Immediately takes 10mg  propnaolol and then another 10-15 mins later and aborts attack.  More effective than 20mg  up front.  Could take a 3rd dose but only if needed.  Advised can lower BP and pulse and should monitor.    - Supportive listening and counseling provided.  - Continue propranolol  10mg  prn panic attacks given that she has had benefit.   - Continue Pristiq  25 mg PO qD as she has benefit with it.  Sounds like Ariel executive function is better on it.  Refilled  Ariel Braun Ariel Braun talks at length about Ariel son, Ariel Braun who has a history of hyperparathyroidism and acromegaly with brown tumors and ectopic parathyroid adenoma and Ariel Braun, Ariel Braun - Encouraged finding a marine scientist club and gave resources from internet search     From prior visits:    Cancer-directed treatment: Currently undergoing  cancer treatment with IPd therapy. Finds that the symptoms are  manageable.  - Ariel WBC tends to decline towards the end of Ariel cancer treatment cycle and uses Zarxio  and Nivestym  injection q14h. Was previously visiting [facility] for receiving injections q7d.  Previously discussed:  - Currently on Ninalro/Pom/Dex. Discussed that steroids like dexamethasone  can increase Ariel anxiety.      Advance Care Planning   - Prognostic awareness: N/A  - Relevant values/priorities: Reduction in anxiety, care for Ariel son/Braun and herself and Ariel family.    - Surrogate decision maker: Not addressed  - Resuscitation preferences: Unknown  -  AD: Deferred  - POLST: Deferred    - GOC convo deferred to future visit  - Today I spent 0 minutes with the patient specifically discussing ACP.     Time Spent  I spent a total of 40 minutes on this patient's care on the day of their visit excluding time spent related to any billed procedures. This time includes time spent with the patient as well as time spent documenting in the medical record, reviewing patient's records and tests, obtaining history, placing orders, communicating with other healthcare professionals, counseling the patient, family, or caregiver, and/or care coordination for the diagnoses above.    I performed this evaluation using real-time telehealth tools, including a live video Zoom connection between my location and the patient's location. Prior to initiating, the patient consented to perform this evaluation using telehealth tools.        Thank you for allowing us  to participate in the care of your patient. Please feel free to reach out with any questions, concerns or ideas about additional ways we could serve your patient & their family.    Follow-up: March 31st    Deward Pasco Schlatter, MD

## 2024-11-21 LAB — COMPREHENSIVE METABOLIC PANEL
AST: 17 IU/L (ref 15–59)
Alanine transaminase: 16 IU/L (ref 0–35)
Albumin, Serum / Plasma: 4 g/dL (ref 3.9–4.9)
Alkaline Phosphatase: 86 IU/L (ref 49–135)
BUN/Creatinine Ratio (External Lab): 34 — ABNORMAL HIGH (ref 12–28)
Bilirubin, Total: 1 mg/dL (ref 0.0–1.2)
Calcium, total, Serum / Plasma: 9.2 mg/dL (ref 8.7–10.3)
Carbon Dioxide, Total: 25 mmol/L (ref 20–29)
Chloride, Serum / Plasma: 108 mmol/L — ABNORMAL HIGH (ref 96–106)
Creatinine: 0.56 mg/dL — ABNORMAL LOW (ref 0.57–1.00)
Globulin, Total: 2.2 g/dL (ref 1.5–4.5)
Glucose, non-fasting: 82 mg/dL (ref 70–99)
Potassium, Serum / Plasma: 4 mmol/L (ref 3.5–5.2)
Protein, Total, Serum / Plasma: 6.2 g/dL (ref 6.0–8.5)
Sodium, Serum / Plasma: 143 mmol/L (ref 134–144)
Urea Nitrogen, serum/plasma: 19 mg/dL (ref 8–27)
eGFRcr: 102 mL/min/1.73 (ref >59)

## 2024-11-21 LAB — COMPLETE BLOOD COUNT WITH DIFFERENTIAL
% Basophils: 1 %
% Eosinophils: 8 %
% Immature Granulocytes: 1 %
% Lymphocytes: 46 %
% Monocytes: 15 %
% Neutrophils: 29 %
Abs Basophils: 0 x10E3/uL (ref 0.0–0.2)
Abs Eosinophils: 0.2 x10E3/uL (ref 0.0–0.4)
Abs Imm Granulocytes: 0 x10E3/uL (ref 0.0–0.1)
Abs Lymphocytes: 0.9 x10E3/uL (ref 0.7–3.1)
Abs Monocytes: 0.3 x10E3/uL (ref 0.1–0.9)
Abs Neutrophils: 0.6 x10E3/uL — ABNORMAL LOW (ref 1.4–7.0)
Hematocrit: 36 % (ref 34.0–46.6)
Hemoglobin: 11.7 g/dL (ref 11.1–15.9)
MCH: 28.9 pg (ref 26.6–33.0)
MCHC: 32.5 g/dL (ref 31.5–35.7)
MCV: 89 fL (ref 79–97)
Platelet Count: 149 x10E3/uL — ABNORMAL LOW (ref 150–450)
RBC Count: 4.05 x10E6/uL (ref 3.77–5.28)
RDW-CV: 16.7 % — ABNORMAL HIGH (ref 11.7–15.4)
WBC Count: 2 x10E3/uL — ABNORMAL LOW (ref 3.4–10.8)

## 2024-11-21 LAB — LACTATE DEHYDROGENASE, BLOOD: LDH, serum/plasma: 115 IU/L — ABNORMAL LOW (ref 119–226)

## 2024-11-23 LAB — PROTEIN ELECTROPHORESIS, SERUM
A/G Ratio: 1.3 (ref 0.7–1.7)
Albumin, sPEP: 3.5 g/dL (ref 2.9–4.4)
Alpha 1 Globulin: 0.3 g/dL (ref 0.0–0.4)
Alpha 2 Globulin: 0.7 g/dL (ref 0.4–1.0)
Beta Globulin: 1 g/dL (ref 0.7–1.3)
Gamma Globulin: 0.7 g/dL (ref 0.4–1.8)
Globulin, Total: 2.7 g/dL (ref 2.2–3.9)
M-Protein (monoclonal), Serum: 0.6 g/dL — ABNORMAL HIGH
Protein, Total, Serum / Plasma: 6.2 g/dL (ref 6.0–8.5)

## 2024-11-23 LAB — IMMUNOFIXATION ELECTROPHORESIS, SERUM
IgA, Serum: 71 mg/dL — ABNORMAL LOW (ref 87–352)
IgG: 892 mg/dL (ref 586–1602)
IgM, Serum: 28 mg/dL (ref 26–217)

## 2024-11-23 LAB — KAPPA AND LAMBDA FREE LIGHT CHAINS, SERUM
Kappa Light Chain, Serum, Free: 103.8 mg/L — ABNORMAL HIGH (ref 3.3–19.4)
Kappa/Lambda Ratio, Serum, Free: 10.28 — ABNORMAL HIGH (ref 0.26–1.65)
Lambda Light Chain, Serum, Free: 10.1 mg/L (ref 5.7–26.3)

## 2024-11-23 LAB — CYTOMEGALOVIRUS DNA, QUANTITATIVE PCR, PLASMA: Cytomegalovirus DNA, Quantitative PCR: NEGATIVE [IU]/mL

## 2024-11-24 LAB — COMPLETE BLOOD COUNT WITH DIFFERENTIAL
% Basophils: 1 %
% Eosinophils: 2 %
% Immature Granulocytes: 1 %
% Lymphocytes: 33 %
% Monocytes: 13 %
% Neutrophils: 50 %
Abs Basophils: 0 x10E3/uL (ref 0.0–0.2)
Abs Eosinophils: 0.1 x10E3/uL (ref 0.0–0.4)
Abs Imm Granulocytes: 0 x10E3/uL (ref 0.0–0.1)
Abs Lymphocytes: 1.1 x10E3/uL (ref 0.7–3.1)
Abs Monocytes: 0.5 x10E3/uL (ref 0.1–0.9)
Abs Neutrophils: 1.7 x10E3/uL (ref 1.4–7.0)
Hematocrit: 39.3 % (ref 34.0–46.6)
Hemoglobin: 12.4 g/dL (ref 11.1–15.9)
MCH: 28.9 pg (ref 26.6–33.0)
MCHC: 31.6 g/dL (ref 31.5–35.7)
MCV: 92 fL (ref 79–97)
Platelet Count: 167 x10E3/uL (ref 150–450)
RBC Count: 4.29 x10E6/uL (ref 3.77–5.28)
RDW-CV: 16.8 % — ABNORMAL HIGH (ref 11.7–15.4)
WBC Count: 3.4 x10E3/uL (ref 3.4–10.8)

## 2024-11-24 LAB — COMPREHENSIVE METABOLIC PANEL
AST: 17 IU/L (ref 15–59)
Alanine transaminase: 17 IU/L (ref 0–35)
Albumin, Serum / Plasma: 4 g/dL (ref 3.9–4.9)
Alkaline Phosphatase: 87 IU/L (ref 49–135)
BUN/Creatinine Ratio (External Lab): 31 — ABNORMAL HIGH (ref 12–28)
Bilirubin, Total: 1.2 mg/dL (ref 0.0–1.2)
Calcium, total, Serum / Plasma: 9.1 mg/dL (ref 8.7–10.3)
Carbon Dioxide, Total: 24 mmol/L (ref 20–29)
Chloride, Serum / Plasma: 108 mmol/L — ABNORMAL HIGH (ref 96–106)
Creatinine: 0.54 mg/dL — ABNORMAL LOW (ref 0.57–1.00)
Globulin, Total: 2.2 g/dL (ref 1.5–4.5)
Glucose, non-fasting: 85 mg/dL (ref 70–99)
Potassium, Serum / Plasma: 4.2 mmol/L (ref 3.5–5.2)
Protein, Total, Serum / Plasma: 6.2 g/dL (ref 6.0–8.5)
Sodium, Serum / Plasma: 144 mmol/L (ref 134–144)
Urea Nitrogen, serum/plasma: 17 mg/dL (ref 8–27)
eGFRcr: 103 mL/min/1.73 (ref >59)

## 2024-12-17 MED ORDER — POMALYST 2 MG CAPSULE
2 | ORAL_CAPSULE | Freq: Every day | ORAL | 0 refills | Status: AC
Start: 2024-12-17 — End: ?

## 2024-12-17 MED ORDER — NINLARO 3 MG CAPSULE
3 | ORAL_CAPSULE | ORAL | 0 refills | Status: AC
Start: 2024-12-17 — End: ?

## 2024-12-18 LAB — COMPLETE BLOOD COUNT WITH DIFFERENTIAL
% Basophils: 3 %
% Eosinophils: 8 %
% Immature Granulocytes: 0 %
% Lymphocytes: 41 %
% Monocytes: 18 %
% Neutrophils: 30 %
Abs Basophils: 0.1 10*3/uL (ref 0.0–0.2)
Abs Eosinophils: 0.2 10*3/uL (ref 0.0–0.4)
Abs Imm Granulocytes: 0 10*3/uL (ref 0.0–0.1)
Abs Lymphocytes: 0.9 10*3/uL (ref 0.7–3.1)
Abs Monocytes: 0.4 10*3/uL (ref 0.1–0.9)
Abs Neutrophils: 0.7 10*3/uL — ABNORMAL LOW (ref 1.4–7.0)
Hematocrit: 38.2 % (ref 34.0–46.6)
Hemoglobin: 12.6 g/dL (ref 11.1–15.9)
MCH: 29.3 pg (ref 26.6–33.0)
MCHC: 33 g/dL (ref 31.5–35.7)
MCV: 89 fL (ref 79–97)
Platelet Count: 142 10*3/uL — ABNORMAL LOW (ref 150–450)
RBC Count: 4.3 x10E6/uL (ref 3.77–5.28)
RDW-CV: 16.9 % — ABNORMAL HIGH (ref 11.7–15.4)
WBC Count: 2.2 10*3/uL — ABNORMAL LOW (ref 3.4–10.8)

## 2024-12-18 LAB — COMPREHENSIVE METABOLIC PANEL
AST: 16 [IU]/L (ref 15–59)
Alanine transaminase: 16 [IU]/L (ref 0–35)
Albumin, Serum / Plasma: 4.1 g/dL (ref 3.9–4.9)
Alkaline Phosphatase: 75 [IU]/L (ref 49–135)
BUN/Creatinine Ratio (External Lab): 30 — ABNORMAL HIGH (ref 12–28)
Bilirubin, Total: 1.2 mg/dL (ref 0.0–1.2)
Calcium, total, Serum / Plasma: 9.6 mg/dL (ref 8.7–10.3)
Carbon Dioxide, Total: 24 mmol/L (ref 20–29)
Chloride, Serum / Plasma: 108 mmol/L — ABNORMAL HIGH (ref 96–106)
Creatinine: 0.61 mg/dL (ref 0.57–1.00)
Globulin, Total: 2.1 g/dL (ref 1.5–4.5)
Glucose, non-fasting: 90 mg/dL (ref 70–99)
Potassium, Serum / Plasma: 4.2 mmol/L (ref 3.5–5.2)
Protein, Total, Serum / Plasma: 6.2 g/dL (ref 6.0–8.5)
Sodium, Serum / Plasma: 142 mmol/L (ref 134–144)
Urea Nitrogen, serum/plasma: 18 mg/dL (ref 8–27)
eGFRcr: 100 mL/min/{1.73_m2} (ref >59)

## 2024-12-19 LAB — IMMUNOFIXATION ELECTROPHORESIS, SERUM
IgA, Serum: 68 mg/dL — ABNORMAL LOW (ref 87–352)
IgG: 830 mg/dL (ref 586–1602)
IgM, Serum: 29 mg/dL (ref 26–217)

## 2024-12-19 LAB — PROTEIN ELECTROPHORESIS, SERUM

## 2024-12-19 LAB — KAPPA AND LAMBDA FREE LIGHT CHAINS, SERUM
Kappa Light Chain, Serum, Free: 106.9 mg/L — ABNORMAL HIGH (ref 3.3–19.4)
Kappa/Lambda Ratio, Serum, Free: 10.2 — ABNORMAL HIGH (ref 0.26–1.65)
Lambda Light Chain, Serum, Free: 10.5 mg/L (ref 5.7–26.3)

## 2024-12-19 LAB — CYTOMEGALOVIRUS DNA, QUANTITATIVE PCR, PLASMA: Cytomegalovirus DNA, Quantitative PCR: NEGATIVE [IU]/mL

## 2024-12-19 LAB — SPECIMEN STATUS REPORT (EXTERNAL LAB)

## 2024-12-19 LAB — LACTATE DEHYDROGENASE, BLOOD: LDH, serum/plasma: 117 [IU]/L — ABNORMAL LOW (ref 119–226)

## 2024-12-21 LAB — IRON, % SATURATION AND TRANSFERRIN OR TIBC
Iron, serum: 63 ug/dL
Transferrin Saturation: 13 %{saturation} — ABNORMAL LOW
Transferrin, Serum/Plasma: 339 mg/dL

## 2024-12-21 LAB — FERRITIN: Ferritin, Serum/Plasma: 31 ng/mL (ref 15–150)
